# Patient Record
Sex: Female | Born: 1945 | Race: White | Hispanic: No | Marital: Married | Smoking: Former smoker
Health system: Southern US, Community
[De-identification: ages and names within clinical notes are randomized; demographics above are authoritative.]

## PROBLEM LIST (undated history)

## (undated) ENCOUNTER — Emergency Department (HOSPITAL_COMMUNITY): Admission: EM | Payer: Commercial Managed Care - HMO | Source: Home / Self Care

## (undated) DIAGNOSIS — D631 Anemia in chronic kidney disease: Secondary | ICD-10-CM

## (undated) DIAGNOSIS — E039 Hypothyroidism, unspecified: Secondary | ICD-10-CM

## (undated) DIAGNOSIS — J189 Pneumonia, unspecified organism: Secondary | ICD-10-CM

## (undated) DIAGNOSIS — I480 Paroxysmal atrial fibrillation: Secondary | ICD-10-CM

## (undated) DIAGNOSIS — I739 Peripheral vascular disease, unspecified: Secondary | ICD-10-CM

## (undated) DIAGNOSIS — I5022 Chronic systolic (congestive) heart failure: Secondary | ICD-10-CM

## (undated) DIAGNOSIS — K219 Gastro-esophageal reflux disease without esophagitis: Secondary | ICD-10-CM

## (undated) DIAGNOSIS — I428 Other cardiomyopathies: Secondary | ICD-10-CM

## (undated) DIAGNOSIS — N189 Chronic kidney disease, unspecified: Secondary | ICD-10-CM

## (undated) DIAGNOSIS — A491 Streptococcal infection, unspecified site: Secondary | ICD-10-CM

## (undated) DIAGNOSIS — D649 Anemia, unspecified: Secondary | ICD-10-CM

## (undated) DIAGNOSIS — E785 Hyperlipidemia, unspecified: Secondary | ICD-10-CM

## (undated) DIAGNOSIS — T827XXA Infection and inflammatory reaction due to other cardiac and vascular devices, implants and grafts, initial encounter: Secondary | ICD-10-CM

## (undated) DIAGNOSIS — I10 Essential (primary) hypertension: Secondary | ICD-10-CM

## (undated) DIAGNOSIS — I447 Left bundle-branch block, unspecified: Secondary | ICD-10-CM

## (undated) DIAGNOSIS — N183 Chronic kidney disease, stage 3 unspecified: Secondary | ICD-10-CM

## (undated) DIAGNOSIS — G4733 Obstructive sleep apnea (adult) (pediatric): Secondary | ICD-10-CM

## (undated) DIAGNOSIS — I2699 Other pulmonary embolism without acute cor pulmonale: Secondary | ICD-10-CM

## (undated) DIAGNOSIS — E119 Type 2 diabetes mellitus without complications: Secondary | ICD-10-CM

## (undated) DIAGNOSIS — Z720 Tobacco use: Secondary | ICD-10-CM

## (undated) DIAGNOSIS — B9620 Unspecified Escherichia coli [E. coli] as the cause of diseases classified elsewhere: Secondary | ICD-10-CM

## (undated) DIAGNOSIS — Z95810 Presence of automatic (implantable) cardiac defibrillator: Secondary | ICD-10-CM

## (undated) DIAGNOSIS — D374 Neoplasm of uncertain behavior of colon: Secondary | ICD-10-CM

## (undated) DIAGNOSIS — J984 Other disorders of lung: Secondary | ICD-10-CM

## (undated) DIAGNOSIS — R0600 Dyspnea, unspecified: Secondary | ICD-10-CM

## (undated) DIAGNOSIS — M1990 Unspecified osteoarthritis, unspecified site: Secondary | ICD-10-CM

## (undated) DIAGNOSIS — Z9289 Personal history of other medical treatment: Secondary | ICD-10-CM

## (undated) DIAGNOSIS — D500 Iron deficiency anemia secondary to blood loss (chronic): Secondary | ICD-10-CM

## (undated) DIAGNOSIS — N390 Urinary tract infection, site not specified: Secondary | ICD-10-CM

## (undated) HISTORY — DX: Anemia, unspecified: D64.9

## (undated) HISTORY — PX: ABDOMINAL HYSTERECTOMY: SHX81

## (undated) HISTORY — DX: Streptococcal infection, unspecified site: A49.1

## (undated) HISTORY — DX: Obstructive sleep apnea (adult) (pediatric): G47.33

## (undated) HISTORY — DX: Other disorders of lung: J98.4

## (undated) HISTORY — DX: Unspecified osteoarthritis, unspecified site: M19.90

## (undated) HISTORY — DX: Left bundle-branch block, unspecified: I44.7

## (undated) HISTORY — DX: Other cardiomyopathies: I42.8

## (undated) HISTORY — PX: KNEE ARTHROSCOPY: SUR90

## (undated) HISTORY — DX: Hyperlipidemia, unspecified: E78.5

## (undated) HISTORY — DX: Neoplasm of uncertain behavior of colon: D37.4

## (undated) HISTORY — DX: Essential (primary) hypertension: I10

## (undated) HISTORY — DX: Pneumonia, unspecified organism: J18.9

## (undated) HISTORY — DX: Anemia in chronic kidney disease: D63.1

## (undated) HISTORY — DX: Tobacco use: Z72.0

## (undated) HISTORY — DX: Chronic kidney disease, unspecified: N18.9

## (undated) HISTORY — PX: CARDIAC CATHETERIZATION: SHX172

## (undated) HISTORY — DX: Iron deficiency anemia secondary to blood loss (chronic): D50.0

## (undated) HISTORY — DX: Gastro-esophageal reflux disease without esophagitis: K21.9

## (undated) HISTORY — DX: Other pulmonary embolism without acute cor pulmonale: I26.99

---

## 1979-02-22 HISTORY — PX: TUBAL LIGATION: SHX77

## 2001-01-06 ENCOUNTER — Encounter: Payer: Self-pay | Admitting: Internal Medicine

## 2001-01-06 ENCOUNTER — Ambulatory Visit (HOSPITAL_COMMUNITY): Admission: RE | Admit: 2001-01-06 | Discharge: 2001-01-06 | Payer: Self-pay

## 2001-04-02 ENCOUNTER — Ambulatory Visit (HOSPITAL_COMMUNITY): Admission: RE | Admit: 2001-04-02 | Discharge: 2001-04-02 | Payer: Self-pay | Admitting: Cardiology

## 2003-05-08 ENCOUNTER — Encounter: Payer: Self-pay | Admitting: Orthopedic Surgery

## 2003-05-23 ENCOUNTER — Ambulatory Visit (HOSPITAL_COMMUNITY): Admission: RE | Admit: 2003-05-23 | Discharge: 2003-05-23 | Payer: Self-pay | Admitting: Orthopedic Surgery

## 2003-09-11 ENCOUNTER — Emergency Department (HOSPITAL_COMMUNITY): Admission: EM | Admit: 2003-09-11 | Discharge: 2003-09-11 | Payer: Self-pay | Admitting: Emergency Medicine

## 2003-10-13 ENCOUNTER — Ambulatory Visit (HOSPITAL_COMMUNITY): Admission: RE | Admit: 2003-10-13 | Discharge: 2003-10-13 | Payer: Self-pay | Admitting: Internal Medicine

## 2003-10-14 ENCOUNTER — Inpatient Hospital Stay (HOSPITAL_COMMUNITY): Admission: EM | Admit: 2003-10-14 | Discharge: 2003-10-17 | Payer: Self-pay | Admitting: Emergency Medicine

## 2003-11-15 ENCOUNTER — Other Ambulatory Visit: Admission: RE | Admit: 2003-11-15 | Discharge: 2003-11-15 | Payer: Self-pay | Admitting: Dermatology

## 2003-12-27 ENCOUNTER — Ambulatory Visit (HOSPITAL_COMMUNITY): Admission: RE | Admit: 2003-12-27 | Discharge: 2003-12-27 | Payer: Self-pay | Admitting: Internal Medicine

## 2004-04-12 ENCOUNTER — Ambulatory Visit (HOSPITAL_COMMUNITY): Admission: RE | Admit: 2004-04-12 | Discharge: 2004-04-12 | Payer: Self-pay | Admitting: Cardiology

## 2004-05-23 ENCOUNTER — Ambulatory Visit: Payer: Self-pay | Admitting: Internal Medicine

## 2004-05-23 HISTORY — PX: A-V CARDIAC PACEMAKER INSERTION: SHX562

## 2004-06-04 ENCOUNTER — Ambulatory Visit: Payer: Self-pay

## 2004-06-10 ENCOUNTER — Inpatient Hospital Stay (HOSPITAL_COMMUNITY): Admission: RE | Admit: 2004-06-10 | Discharge: 2004-06-11 | Payer: Self-pay | Admitting: Internal Medicine

## 2004-06-10 ENCOUNTER — Ambulatory Visit: Payer: Self-pay | Admitting: Internal Medicine

## 2004-06-26 ENCOUNTER — Ambulatory Visit: Payer: Self-pay | Admitting: Internal Medicine

## 2004-06-26 ENCOUNTER — Ambulatory Visit: Payer: Self-pay

## 2004-08-09 ENCOUNTER — Ambulatory Visit (HOSPITAL_COMMUNITY): Admission: RE | Admit: 2004-08-09 | Discharge: 2004-08-09 | Payer: Self-pay | Admitting: Family Medicine

## 2004-09-16 ENCOUNTER — Ambulatory Visit: Payer: Self-pay | Admitting: Orthopedic Surgery

## 2004-10-07 ENCOUNTER — Ambulatory Visit: Payer: Self-pay | Admitting: Internal Medicine

## 2004-11-15 ENCOUNTER — Emergency Department (HOSPITAL_COMMUNITY): Admission: EM | Admit: 2004-11-15 | Discharge: 2004-11-15 | Payer: Self-pay | Admitting: Emergency Medicine

## 2004-11-21 ENCOUNTER — Ambulatory Visit: Payer: Self-pay | Admitting: Cardiology

## 2004-12-04 ENCOUNTER — Ambulatory Visit: Payer: Self-pay | Admitting: *Deleted

## 2005-03-14 ENCOUNTER — Ambulatory Visit: Payer: Self-pay | Admitting: *Deleted

## 2005-04-07 ENCOUNTER — Ambulatory Visit: Payer: Self-pay | Admitting: Orthopedic Surgery

## 2005-04-16 ENCOUNTER — Ambulatory Visit (HOSPITAL_COMMUNITY): Admission: RE | Admit: 2005-04-16 | Discharge: 2005-04-16 | Payer: Self-pay | Admitting: Family Medicine

## 2005-05-08 ENCOUNTER — Encounter: Admission: RE | Admit: 2005-05-08 | Discharge: 2005-05-08 | Payer: Self-pay | Admitting: Orthopedic Surgery

## 2005-06-27 ENCOUNTER — Ambulatory Visit: Payer: Self-pay | Admitting: Cardiology

## 2005-06-30 ENCOUNTER — Ambulatory Visit: Payer: Self-pay

## 2005-06-30 ENCOUNTER — Encounter: Payer: Self-pay | Admitting: Cardiology

## 2005-07-04 ENCOUNTER — Ambulatory Visit: Payer: Self-pay | Admitting: Internal Medicine

## 2005-07-24 DIAGNOSIS — J189 Pneumonia, unspecified organism: Secondary | ICD-10-CM

## 2005-07-24 HISTORY — PX: TOTAL HIP ARTHROPLASTY: SHX124

## 2005-07-24 HISTORY — DX: Pneumonia, unspecified organism: J18.9

## 2005-08-05 ENCOUNTER — Ambulatory Visit: Payer: Self-pay | Admitting: Cardiology

## 2005-08-05 ENCOUNTER — Ambulatory Visit: Payer: Self-pay | Admitting: Physical Medicine & Rehabilitation

## 2005-08-05 ENCOUNTER — Inpatient Hospital Stay (HOSPITAL_COMMUNITY): Admission: RE | Admit: 2005-08-05 | Discharge: 2005-08-08 | Payer: Self-pay | Admitting: Orthopedic Surgery

## 2005-08-08 ENCOUNTER — Inpatient Hospital Stay
Admission: RE | Admit: 2005-08-08 | Discharge: 2005-08-15 | Payer: Self-pay | Admitting: Physical Medicine & Rehabilitation

## 2005-09-26 ENCOUNTER — Inpatient Hospital Stay (HOSPITAL_COMMUNITY): Admission: EM | Admit: 2005-09-26 | Discharge: 2005-10-01 | Payer: Self-pay | Admitting: Emergency Medicine

## 2005-09-29 ENCOUNTER — Ambulatory Visit: Payer: Self-pay | Admitting: Internal Medicine

## 2005-10-01 ENCOUNTER — Ambulatory Visit: Payer: Self-pay | Admitting: Orthopedic Surgery

## 2005-10-17 ENCOUNTER — Ambulatory Visit: Payer: Self-pay | Admitting: Orthopedic Surgery

## 2005-10-17 ENCOUNTER — Inpatient Hospital Stay (HOSPITAL_COMMUNITY): Admission: EM | Admit: 2005-10-17 | Discharge: 2005-11-14 | Payer: Self-pay | Admitting: Emergency Medicine

## 2005-10-20 ENCOUNTER — Ambulatory Visit: Payer: Self-pay | Admitting: Cardiology

## 2005-10-23 ENCOUNTER — Ambulatory Visit: Payer: Self-pay | Admitting: Orthopedic Surgery

## 2005-10-24 ENCOUNTER — Ambulatory Visit: Payer: Self-pay | Admitting: Critical Care Medicine

## 2005-10-24 ENCOUNTER — Ambulatory Visit: Payer: Self-pay | Admitting: *Deleted

## 2005-10-24 ENCOUNTER — Encounter: Payer: Self-pay | Admitting: Orthopedic Surgery

## 2005-10-26 ENCOUNTER — Ambulatory Visit: Payer: Self-pay | Admitting: Infectious Diseases

## 2005-11-02 ENCOUNTER — Encounter: Payer: Self-pay | Admitting: Cardiology

## 2005-11-18 HISTORY — PX: PACEMAKER REMOVAL: SHX5066

## 2005-11-19 ENCOUNTER — Ambulatory Visit: Payer: Self-pay | Admitting: Internal Medicine

## 2005-11-24 ENCOUNTER — Ambulatory Visit: Payer: Self-pay | Admitting: Internal Medicine

## 2005-12-05 ENCOUNTER — Ambulatory Visit: Payer: Self-pay | Admitting: Cardiology

## 2005-12-16 ENCOUNTER — Ambulatory Visit: Payer: Self-pay | Admitting: Family Medicine

## 2006-01-14 ENCOUNTER — Ambulatory Visit: Payer: Self-pay | Admitting: *Deleted

## 2006-02-10 ENCOUNTER — Emergency Department (HOSPITAL_COMMUNITY): Admission: EM | Admit: 2006-02-10 | Discharge: 2006-02-11 | Payer: Self-pay | Admitting: Emergency Medicine

## 2006-02-13 ENCOUNTER — Ambulatory Visit: Payer: Self-pay | Admitting: Internal Medicine

## 2006-02-18 ENCOUNTER — Ambulatory Visit: Payer: Self-pay | Admitting: *Deleted

## 2006-03-18 ENCOUNTER — Ambulatory Visit: Payer: Self-pay | Admitting: Cardiology

## 2006-04-09 ENCOUNTER — Inpatient Hospital Stay (HOSPITAL_COMMUNITY): Admission: EM | Admit: 2006-04-09 | Discharge: 2006-04-13 | Payer: Self-pay | Admitting: Emergency Medicine

## 2006-04-09 ENCOUNTER — Ambulatory Visit: Payer: Self-pay | Admitting: Cardiology

## 2006-04-28 ENCOUNTER — Ambulatory Visit: Payer: Self-pay | Admitting: Cardiology

## 2006-05-26 ENCOUNTER — Ambulatory Visit: Payer: Self-pay | Admitting: Cardiology

## 2006-06-29 ENCOUNTER — Ambulatory Visit: Payer: Self-pay | Admitting: Cardiology

## 2006-07-06 ENCOUNTER — Ambulatory Visit: Payer: Self-pay | Admitting: Internal Medicine

## 2006-07-24 ENCOUNTER — Encounter (INDEPENDENT_AMBULATORY_CARE_PROVIDER_SITE_OTHER): Payer: Self-pay | Admitting: Specialist

## 2006-07-24 ENCOUNTER — Ambulatory Visit: Payer: Self-pay | Admitting: Internal Medicine

## 2006-07-24 ENCOUNTER — Ambulatory Visit (HOSPITAL_COMMUNITY): Admission: RE | Admit: 2006-07-24 | Discharge: 2006-07-24 | Payer: Self-pay | Admitting: Internal Medicine

## 2006-09-03 ENCOUNTER — Inpatient Hospital Stay (HOSPITAL_COMMUNITY): Admission: EM | Admit: 2006-09-03 | Discharge: 2006-09-06 | Payer: Self-pay | Admitting: Emergency Medicine

## 2007-01-01 ENCOUNTER — Ambulatory Visit: Payer: Self-pay | Admitting: Internal Medicine

## 2007-03-03 ENCOUNTER — Ambulatory Visit: Payer: Self-pay | Admitting: Internal Medicine

## 2007-04-22 ENCOUNTER — Ambulatory Visit: Payer: Self-pay | Admitting: Cardiology

## 2007-04-22 LAB — CONVERTED CEMR LAB
ALT: 11 units/L
Basophils Relative: 0 %
Eosinophils Relative: 2 %
Glucose, Bld: 168 mg/dL
HCT: 34.6 %
Hgb A1c MFr Bld: 7.2 %
Monocytes Relative: 7 %
Platelets: 260 10*3/uL
Potassium: 4.5 meq/L
RDW: 12.9 %
Sodium: 138 meq/L
Total Bilirubin: 0.06 mg/dL

## 2007-06-02 ENCOUNTER — Ambulatory Visit: Payer: Self-pay | Admitting: Internal Medicine

## 2007-06-04 ENCOUNTER — Telehealth (INDEPENDENT_AMBULATORY_CARE_PROVIDER_SITE_OTHER): Payer: Self-pay | Admitting: *Deleted

## 2007-06-04 ENCOUNTER — Ambulatory Visit: Payer: Self-pay | Admitting: Internal Medicine

## 2007-06-04 DIAGNOSIS — I10 Essential (primary) hypertension: Secondary | ICD-10-CM | POA: Insufficient documentation

## 2007-06-04 DIAGNOSIS — E785 Hyperlipidemia, unspecified: Secondary | ICD-10-CM | POA: Insufficient documentation

## 2007-06-07 ENCOUNTER — Ambulatory Visit: Payer: Self-pay | Admitting: Cardiology

## 2007-06-08 ENCOUNTER — Ambulatory Visit (HOSPITAL_COMMUNITY): Admission: RE | Admit: 2007-06-08 | Discharge: 2007-06-08 | Payer: Self-pay | Admitting: Internal Medicine

## 2007-06-08 ENCOUNTER — Telehealth (INDEPENDENT_AMBULATORY_CARE_PROVIDER_SITE_OTHER): Payer: Self-pay | Admitting: *Deleted

## 2007-06-08 ENCOUNTER — Encounter (INDEPENDENT_AMBULATORY_CARE_PROVIDER_SITE_OTHER): Payer: Self-pay | Admitting: Internal Medicine

## 2007-06-08 ENCOUNTER — Ambulatory Visit (HOSPITAL_BASED_OUTPATIENT_CLINIC_OR_DEPARTMENT_OTHER): Admission: RE | Admit: 2007-06-08 | Discharge: 2007-06-08 | Payer: Self-pay | Admitting: Internal Medicine

## 2007-06-13 ENCOUNTER — Ambulatory Visit: Payer: Self-pay | Admitting: Internal Medicine

## 2007-06-24 HISTORY — PX: COLONOSCOPY W/ POLYPECTOMY: SHX1380

## 2007-06-29 ENCOUNTER — Encounter (INDEPENDENT_AMBULATORY_CARE_PROVIDER_SITE_OTHER): Payer: Self-pay | Admitting: Internal Medicine

## 2007-07-01 ENCOUNTER — Telehealth (INDEPENDENT_AMBULATORY_CARE_PROVIDER_SITE_OTHER): Payer: Self-pay | Admitting: *Deleted

## 2007-07-16 ENCOUNTER — Ambulatory Visit: Payer: Self-pay | Admitting: Internal Medicine

## 2007-07-16 ENCOUNTER — Ambulatory Visit: Payer: Self-pay | Admitting: Cardiology

## 2007-07-19 ENCOUNTER — Encounter (INDEPENDENT_AMBULATORY_CARE_PROVIDER_SITE_OTHER): Payer: Self-pay | Admitting: Internal Medicine

## 2007-07-19 ENCOUNTER — Telehealth (INDEPENDENT_AMBULATORY_CARE_PROVIDER_SITE_OTHER): Payer: Self-pay | Admitting: *Deleted

## 2007-07-30 ENCOUNTER — Ambulatory Visit: Admission: RE | Admit: 2007-07-30 | Discharge: 2007-07-30 | Payer: Self-pay | Admitting: Internal Medicine

## 2007-08-09 ENCOUNTER — Encounter (INDEPENDENT_AMBULATORY_CARE_PROVIDER_SITE_OTHER): Payer: Self-pay | Admitting: Internal Medicine

## 2007-08-09 ENCOUNTER — Ambulatory Visit: Payer: Self-pay | Admitting: Cardiology

## 2007-08-18 ENCOUNTER — Ambulatory Visit: Payer: Self-pay | Admitting: Pulmonary Disease

## 2007-08-27 ENCOUNTER — Encounter (INDEPENDENT_AMBULATORY_CARE_PROVIDER_SITE_OTHER): Payer: Self-pay | Admitting: Internal Medicine

## 2007-09-02 ENCOUNTER — Ambulatory Visit: Payer: Self-pay | Admitting: Internal Medicine

## 2007-09-07 ENCOUNTER — Telehealth (INDEPENDENT_AMBULATORY_CARE_PROVIDER_SITE_OTHER): Payer: Self-pay | Admitting: Internal Medicine

## 2007-09-30 ENCOUNTER — Encounter (INDEPENDENT_AMBULATORY_CARE_PROVIDER_SITE_OTHER): Payer: Self-pay | Admitting: Internal Medicine

## 2007-10-06 ENCOUNTER — Encounter (INDEPENDENT_AMBULATORY_CARE_PROVIDER_SITE_OTHER): Payer: Self-pay | Admitting: Internal Medicine

## 2007-12-06 ENCOUNTER — Encounter (INDEPENDENT_AMBULATORY_CARE_PROVIDER_SITE_OTHER): Payer: Self-pay | Admitting: Internal Medicine

## 2007-12-08 ENCOUNTER — Ambulatory Visit: Payer: Self-pay | Admitting: Cardiology

## 2007-12-15 ENCOUNTER — Ambulatory Visit: Payer: Self-pay | Admitting: Cardiology

## 2008-03-02 ENCOUNTER — Encounter (INDEPENDENT_AMBULATORY_CARE_PROVIDER_SITE_OTHER): Payer: Self-pay | Admitting: Internal Medicine

## 2008-04-09 ENCOUNTER — Ambulatory Visit: Payer: Self-pay | Admitting: Cardiology

## 2008-04-09 ENCOUNTER — Inpatient Hospital Stay (HOSPITAL_COMMUNITY): Admission: EM | Admit: 2008-04-09 | Discharge: 2008-04-12 | Payer: Self-pay | Admitting: Emergency Medicine

## 2008-04-11 ENCOUNTER — Encounter: Payer: Self-pay | Admitting: Cardiology

## 2008-05-01 ENCOUNTER — Ambulatory Visit: Payer: Self-pay | Admitting: Cardiology

## 2008-05-29 ENCOUNTER — Ambulatory Visit: Payer: Self-pay | Admitting: Cardiology

## 2008-06-02 ENCOUNTER — Encounter (HOSPITAL_COMMUNITY): Admission: RE | Admit: 2008-06-02 | Discharge: 2008-06-20 | Payer: Self-pay | Admitting: Cardiology

## 2008-06-02 ENCOUNTER — Ambulatory Visit: Payer: Self-pay | Admitting: Cardiology

## 2008-07-04 ENCOUNTER — Ambulatory Visit: Payer: Self-pay | Admitting: Cardiology

## 2008-10-23 ENCOUNTER — Ambulatory Visit: Payer: Self-pay | Admitting: Cardiology

## 2008-10-23 ENCOUNTER — Ambulatory Visit (HOSPITAL_COMMUNITY): Admission: RE | Admit: 2008-10-23 | Discharge: 2008-10-23 | Payer: Self-pay | Admitting: Cardiology

## 2008-10-23 ENCOUNTER — Encounter: Payer: Self-pay | Admitting: Orthopedic Surgery

## 2008-11-13 ENCOUNTER — Encounter: Payer: Self-pay | Admitting: Orthopedic Surgery

## 2008-11-15 ENCOUNTER — Ambulatory Visit: Payer: Self-pay | Admitting: Orthopedic Surgery

## 2008-11-28 ENCOUNTER — Ambulatory Visit: Payer: Self-pay | Admitting: Cardiology

## 2008-11-28 ENCOUNTER — Encounter: Payer: Self-pay | Admitting: Cardiology

## 2008-12-02 ENCOUNTER — Encounter (INDEPENDENT_AMBULATORY_CARE_PROVIDER_SITE_OTHER): Payer: Self-pay | Admitting: *Deleted

## 2008-12-02 LAB — CONVERTED CEMR LAB
CO2: 25 meq/L
Calcium: 10.2 mg/dL
Creatinine, Ser: 1.4 mg/dL
Glucose, Bld: 179 mg/dL
HDL: 34 mg/dL
Hgb A1c MFr Bld: 6.6 %
Sodium: 141 meq/L
Triglycerides: 258 mg/dL

## 2008-12-27 ENCOUNTER — Encounter: Payer: Self-pay | Admitting: Orthopedic Surgery

## 2009-01-01 ENCOUNTER — Encounter: Payer: Self-pay | Admitting: Cardiology

## 2009-01-29 ENCOUNTER — Ambulatory Visit: Payer: Self-pay | Admitting: Cardiology

## 2009-01-29 DIAGNOSIS — I447 Left bundle-branch block, unspecified: Secondary | ICD-10-CM

## 2009-03-15 ENCOUNTER — Encounter (INDEPENDENT_AMBULATORY_CARE_PROVIDER_SITE_OTHER): Payer: Self-pay | Admitting: *Deleted

## 2009-03-19 ENCOUNTER — Encounter: Payer: Self-pay | Admitting: Cardiology

## 2009-03-19 LAB — CONVERTED CEMR LAB
AST: 13 units/L (ref 0–37)
Albumin: 4.8 g/dL (ref 3.5–5.2)
Alkaline Phosphatase: 72 units/L (ref 39–117)
CO2: 25 meq/L (ref 19–32)
Calcium: 10.2 mg/dL (ref 8.4–10.5)
Chloride: 105 meq/L (ref 96–112)
Creatinine, Ser: 1.53 mg/dL — ABNORMAL HIGH (ref 0.40–1.20)
Lymphocytes Relative: 33 % (ref 12–46)
Lymphs Abs: 2.7 10*3/uL (ref 0.7–4.0)
MCV: 93.8 fL (ref 78.0–100.0)
Monocytes Relative: 8 % (ref 3–12)
Platelets: 269 10*3/uL (ref 150–400)
WBC: 8.4 10*3/uL (ref 4.0–10.5)

## 2009-03-20 ENCOUNTER — Encounter (INDEPENDENT_AMBULATORY_CARE_PROVIDER_SITE_OTHER): Payer: Self-pay | Admitting: *Deleted

## 2009-05-30 ENCOUNTER — Encounter (INDEPENDENT_AMBULATORY_CARE_PROVIDER_SITE_OTHER): Payer: Self-pay | Admitting: *Deleted

## 2009-06-01 ENCOUNTER — Encounter (INDEPENDENT_AMBULATORY_CARE_PROVIDER_SITE_OTHER): Payer: Self-pay | Admitting: *Deleted

## 2009-06-01 ENCOUNTER — Ambulatory Visit: Payer: Self-pay | Admitting: Cardiology

## 2009-06-11 ENCOUNTER — Encounter (INDEPENDENT_AMBULATORY_CARE_PROVIDER_SITE_OTHER): Payer: Self-pay | Admitting: *Deleted

## 2009-06-11 LAB — CONVERTED CEMR LAB
Albumin: 4.7 g/dL
Albumin: 4.7 g/dL (ref 3.5–5.2)
BUN: 26 mg/dL — ABNORMAL HIGH (ref 6–23)
Basophils Absolute: 0 10*3/uL
Basophils Absolute: 0 10*3/uL (ref 0.0–0.1)
Basophils Relative: 1 %
Chloride: 102 meq/L
Chloride: 102 meq/L (ref 96–112)
Eosinophils Absolute: 0.1 10*3/uL (ref 0.0–0.7)
Eosinophils Relative: 2 %
Glucose, Bld: 139 mg/dL — ABNORMAL HIGH (ref 70–99)
Hemoglobin: 11.8 g/dL
Hemoglobin: 11.8 g/dL — ABNORMAL LOW (ref 12.0–15.0)
Hgb A1c MFr Bld: 6.8 %
Lymphocytes Relative: 32 %
Lymphocytes Relative: 32 % (ref 12–46)
Lymphs Abs: 1.8 10*3/uL
MCHC: 33.1 g/dL
MCV: 94.2 fL (ref 78.0–100.0)
Monocytes Absolute: 0.4 10*3/uL
Monocytes Relative: 7 % (ref 3–12)
Neutrophils Relative %: 59 % (ref 43–77)
Potassium: 4 meq/L
Potassium: 4 meq/L (ref 3.5–5.3)
RBC: 3.78 M/uL
RBC: 3.78 M/uL — ABNORMAL LOW (ref 3.87–5.11)
RDW: 12.8 % (ref 11.5–15.5)
Sodium: 142 meq/L (ref 135–145)
Total Bilirubin: 0.8 mg/dL (ref 0.3–1.2)
Total Protein: 7.5 g/dL
Total Protein: 7.5 g/dL (ref 6.0–8.3)

## 2009-07-09 ENCOUNTER — Encounter (INDEPENDENT_AMBULATORY_CARE_PROVIDER_SITE_OTHER): Payer: Self-pay | Admitting: *Deleted

## 2009-07-09 LAB — CONVERTED CEMR LAB: Cholesterol: 168 mg/dL

## 2009-09-25 ENCOUNTER — Encounter (INDEPENDENT_AMBULATORY_CARE_PROVIDER_SITE_OTHER): Payer: Self-pay

## 2009-12-31 ENCOUNTER — Encounter (INDEPENDENT_AMBULATORY_CARE_PROVIDER_SITE_OTHER): Payer: Self-pay | Admitting: *Deleted

## 2010-01-02 LAB — CONVERTED CEMR LAB
ALT: 9 units/L (ref 0–35)
Albumin: 4.4 g/dL (ref 3.5–5.2)
CO2: 25 meq/L (ref 19–32)
Calcium: 10.2 mg/dL (ref 8.4–10.5)
Chloride: 110 meq/L (ref 96–112)
Creatinine, Ser: 1.12 mg/dL (ref 0.40–1.20)
Hemoglobin: 11.6 g/dL — ABNORMAL LOW (ref 12.0–15.0)
Hgb A1c MFr Bld: 6.8 % — ABNORMAL HIGH (ref <5.7)
MCHC: 31.9 g/dL (ref 30.0–36.0)
MCV: 96.3 fL (ref 78.0–100.0)
Monocytes Absolute: 0.4 10*3/uL (ref 0.1–1.0)
Monocytes Relative: 7 % (ref 3–12)
Neutrophils Relative %: 63 % (ref 43–77)
Potassium: 4.8 meq/L (ref 3.5–5.3)
RBC: 3.78 M/uL — ABNORMAL LOW (ref 3.87–5.11)
Sodium: 145 meq/L (ref 135–145)
Total Protein: 7.8 g/dL (ref 6.0–8.3)
WBC: 6 10*3/uL (ref 4.0–10.5)

## 2010-01-04 ENCOUNTER — Encounter (INDEPENDENT_AMBULATORY_CARE_PROVIDER_SITE_OTHER): Payer: Self-pay | Admitting: *Deleted

## 2010-01-04 ENCOUNTER — Ambulatory Visit: Payer: Self-pay | Admitting: Cardiology

## 2010-04-10 ENCOUNTER — Encounter (INDEPENDENT_AMBULATORY_CARE_PROVIDER_SITE_OTHER): Payer: Self-pay | Admitting: *Deleted

## 2010-04-29 ENCOUNTER — Encounter: Payer: Self-pay | Admitting: Cardiology

## 2010-05-27 ENCOUNTER — Encounter (INDEPENDENT_AMBULATORY_CARE_PROVIDER_SITE_OTHER): Payer: Self-pay | Admitting: *Deleted

## 2010-05-27 LAB — CONVERTED CEMR LAB
AST: 14 units/L
Alkaline Phosphatase: 80 units/L
BUN: 30 mg/dL
CO2: 26 meq/L
Cholesterol: 171 mg/dL
HCT: 34.5 %
Hemoglobin: 11.8 g/dL
Hgb A1c MFr Bld: 6.9 %
Lymphocytes Relative: 29 %
Lymphs Abs: 1.9 10*3/uL
Monocytes Absolute: 0.4 10*3/uL
Platelets: 251 10*3/uL
Potassium: 4.6 meq/L
RDW: 13.2 %
Sodium: 139 meq/L
Total Protein: 7.5 g/dL
Triglycerides: 154 mg/dL
WBC: 6.7 10*3/uL

## 2010-05-29 ENCOUNTER — Encounter: Payer: Self-pay | Admitting: Cardiology

## 2010-07-09 ENCOUNTER — Encounter (INDEPENDENT_AMBULATORY_CARE_PROVIDER_SITE_OTHER): Payer: Self-pay | Admitting: *Deleted

## 2010-07-12 ENCOUNTER — Ambulatory Visit
Admission: RE | Admit: 2010-07-12 | Discharge: 2010-07-12 | Payer: Self-pay | Source: Home / Self Care | Attending: Cardiology | Admitting: Cardiology

## 2010-07-12 ENCOUNTER — Encounter (INDEPENDENT_AMBULATORY_CARE_PROVIDER_SITE_OTHER): Payer: Self-pay | Admitting: *Deleted

## 2010-07-12 DIAGNOSIS — R32 Unspecified urinary incontinence: Secondary | ICD-10-CM | POA: Insufficient documentation

## 2010-07-21 LAB — CONVERTED CEMR LAB
ALT: 11 units/L (ref 0–35)
Albumin: 4.4 g/dL (ref 3.5–5.2)
Basophils Absolute: 0 10*3/uL (ref 0.0–0.1)
Chloride: 109 meq/L (ref 96–112)
Creatinine, Ser: 0.99 mg/dL (ref 0.40–1.20)
Eosinophils Absolute: 0.1 10*3/uL (ref 0.0–0.7)
Glucose, Bld: 193 mg/dL — ABNORMAL HIGH (ref 70–99)
HDL: 30 mg/dL — ABNORMAL LOW (ref >39)
LDL Cholesterol: 137 mg/dL — ABNORMAL HIGH (ref 0–99)
Lymphocytes Relative: 24 % (ref 12–46)
Lymphs Abs: 1.2 10*3/uL (ref 0.7–4.0)
MCHC: 32.2 g/dL (ref 30.0–36.0)
MCV: 96.2 fL (ref 78.0–100.0)
Monocytes Absolute: 0.6 10*3/uL (ref 0.1–1.0)
Monocytes Relative: 11 % (ref 3–12)
Potassium: 4.4 meq/L (ref 3.5–5.3)
RDW: 13.2 % (ref 11.5–15.5)
Total Bilirubin: 0.5 mg/dL (ref 0.3–1.2)
Total CHOL/HDL Ratio: 7.7
VLDL: 64 mg/dL — ABNORMAL HIGH (ref 0–40)
WBC: 5.2 10*3/uL (ref 4.0–10.5)

## 2010-07-23 NOTE — Miscellaneous (Signed)
Summary: LABS LIPIDS,07/09/2009 CBCD,CMP,A1C,06/11/2009  Clinical Lists Changes  Observations: Added new observation of LDL: 43 mg/dL (21/30/8657 84:69) Added new observation of HDL: 33 mg/dL (62/95/2841 32:44) Added new observation of TRIGLYC TOT: 215 mg/dL (06/25/7251 66:44) Added new observation of CHOLESTEROL: 168 mg/dL (03/47/4259 56:38) Added new observation of ABSOLUTE BAS: 0.0 K/uL (06/11/2009 13:12) Added new observation of BASOPHIL %: 1 % (06/11/2009 13:12) Added new observation of EOS ABSLT: 0.1 K/uL (06/11/2009 13:12) Added new observation of % EOS AUTO: 2 % (06/11/2009 13:12) Added new observation of ABSOLUTE MON: 0.4 K/uL (06/11/2009 13:12) Added new observation of MONOCYTE %: 7 % (06/11/2009 13:12) Added new observation of ABS LYMPHOCY: 1.8 K/uL (06/11/2009 13:12) Added new observation of LYMPHS %: 32 % (06/11/2009 13:12) Added new observation of RDW: 12.8 % (06/11/2009 13:12) Added new observation of MCHC RBC: 33.1 g/dL (75/64/3329 51:88) Added new observation of RBC M/UL: 3.78 M/uL (06/11/2009 13:12) Added new observation of CALCIUM: 10.4 mg/dL (41/66/0630 16:01) Added new observation of ALBUMIN: 4.7 g/dL (09/32/3557 32:20) Added new observation of PROTEIN, TOT: 7.5 g/dL (25/42/7062 37:62) Added new observation of SGPT (ALT): 8 units/L (06/11/2009 13:12) Added new observation of SGOT (AST): 10 units/L (06/11/2009 13:12) Added new observation of ALK PHOS: 70 units/L (06/11/2009 13:12) Added new observation of CREATININE: 1.34 mg/dL (83/15/1761 60:73) Added new observation of BUN: 26 mg/dL (71/11/2692 85:46) Added new observation of BG RANDOM: 139 mg/dL (27/08/5007 38:18) Added new observation of CO2 PLSM/SER: 27 meq/L (06/11/2009 13:12) Added new observation of CL SERUM: 102 meq/L (06/11/2009 13:12) Added new observation of K SERUM: 4.0 meq/L (06/11/2009 13:12) Added new observation of NA: 142 meq/L (06/11/2009 13:12) Added new observation of PLATELETK/UL: 202 K/uL  (06/11/2009 13:12) Added new observation of MCV: 94.2 fL (06/11/2009 13:12) Added new observation of HCT: 35.6 % (06/11/2009 13:12) Added new observation of HGB: 11.8 g/dL (29/93/7169 67:89) Added new observation of WBC COUNT: 5.8 10*3/microliter (06/11/2009 13:12) Added new observation of HGBA1C: 6.8 % (06/11/2009 13:12)

## 2010-07-23 NOTE — Letter (Signed)
Summary: [facility] at [employer]  [address] [place] [address]   Phone: [phone]  Fax: [phone]     January 04, 2010 MRN: [id number]   [name] [address] [place], [place]  [address]      YOUR LAB WORK IS DUE   June 06, 2010  Please go to [facility], located across the street from [facility] on the second floor.  Hours are Monday - Friday 7am until 7:30pm         Saturday 8am until 12noon    __  DO NOT EAT OR DRINK AFTER MIDNIGHT EVENING PRIOR TO LABWORK  _X_ YOUR LABWORK IS NOT FASTING --YOU MAY EAT PRIOR TO LABWORK

## 2010-07-23 NOTE — Assessment & Plan Note (Signed)
Summary: Z6X      Allergies Added: NKDA  Visit Type:  Follow-up Referring Provider:  . Primary Provider:  Dr. [name]   History of Present Illness: [name] returns to the office as scheduled for continued assessment and treatment of a severe cardiomyopathy.  Since her last visit, she has been stable.  Ambulation is greatly impaired by chronic right hip pain, but she gets around some with a walker.  She notes chronic fatigue.  She has no orthopnea, PND, chest pain or pedal edema.  We have been attempting to avoid orthopaedic surgery due to markedly increased risk and an anticipated difficult recovery.  Now she has also been asked to  consider bladder resuspension for treatment of stress incontinence.  She has not yet been evaluated by a gynecologist.  Current Medications (verified): 1)  Glimepiride 4 Mg  Tabs (Glimepiride) .Marland Kitchen.. 1 By Mouth Once Daily 2)  Carvedilol 25 Mg  Tabs (Carvedilol) .Marland Kitchen.. 1 By Mouth Two Times A Day 3)  Metformin Hcl 1000 Mg  Tabs (Metformin Hcl) .Marland Kitchen.. 1 By Mouth Two Times A Day 4)  Spironolactone 25 Mg  Tabs (Spironolactone) .Marland Kitchen.. 1 By Mouth Once Daily 5)  Furosemide 80 Mg  Tabs (Furosemide) .Marland Kitchen.. 1 Tab Am 1/2 Tab Pm 6)  Lancets .... Use As Directed Daily 7)  Glucometer Strips .... Use As Directed Daily 8)  Norco 10-325 Mg Tabs (Hydrocodone-Acetaminophen) .Marland Kitchen.. 1 Q 4 As Needed Pain 9)  Tylenol Pm Extra Strength 500-25 Mg Tabs (Diphenhydramine-Apap (Sleep)) .... As Needed 10)  Lisinopril 20 Mg Tabs (Lisinopril) .Marland Kitchen.. 1 Tab Daily 11)  Pravastatin Sodium 40 Mg Tabs (Pravastatin Sodium) .Marland Kitchen.. 1 Tabe Two Times A Day  Allergies (verified): No Known Drug Allergies  Past History:  PMH, FH, and Social History reviewed and updated.  Review of Systems       See history of present illness.  Vital Signs:  Patient profile:   65 year old female Weight:      192 pounds BMI:     32.07 Pulse rate:   73 / minute BP sitting:   127 / 68  (right arm)  Vitals Entered  By: Dreama Saa, CNA (January 04, 2010 2:46 PM)  Physical Exam  General:  Overweight; well developed; no acute distress:   Neck-No JVD; no carotid bruits; no HJR Lungs-No tachypnea, no rales; no rhonchi; no wheezes: Cardiovascular-normal PMI; normal S1; prominent splitting of the second heart sound; modest low frequency systolic murmur at the left sternal border Abdomen-BS normal; soft and non-tender without masses or organomegaly:  Musculoskeletal-No deformities, no cyanosis or clubbing: Neurologic-Normal cranial nerves; symmetric strength and tone:  Skin-Warm, no significant lesions: Extremities-Nl distal pulses; trace edema:     Impression & Recommendations:  Problem # 1:  CARDIOMYOPATHY, 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  Serial chemistry profile showed normal renal function and normal electrolytes.  She has minimal anemia that has not worsened.  Problem # 2:  HYPERTENSION (ICD-401.9) Blood pressure control has not been an issue since development of severe left ventricular dysfunction.  On the other hand, there has been no hypotension to limit use of medications to treat her cardiomyopathy, all of which are currently being given at optimal dosage.  Problem # 3:  HYPERLIPIDEMIA (ICD-272.4) Recent lipid profile was good.  Hyperlipidemia has been adequately treated with a high dose of pravastatin without apparent adverse effects.  There continues to be no objective evidence for vascular disease, but  use of a statin in this patient with diabetes is appropriate.  Problem # 4:  RENAL DISEASE, CHRONIC, STAGE III (ICD-585.3) Renal function has been slightly improved with her current medical regime.  Problem # 5:  DIABETES MELLITUS, TYPE II (ICD-250.00) Hemoglobin A1c values have been excellent, having been maintained below 7 for at least the past 6 months.  I will plan to reassess this nice woman in 6 months and I will be happy to discuss  consideration of surgery with her gynecologist, once she has been appropriately evaluated.  I will seek an opinion from a local pain clinic as to whether they think evaluation and treatment in their facility could assist with her hip discomfort.  Other Orders: Future Orders: T-Basic Metabolic Panel [phone]) ... 03/07/2010 T-Basic Metabolic Panel [id number]) ... 06/06/2010  Patient Instructions: 1)    2)  Your physician recommends that you return for lab work in:2 and 5 months 3)  Your physician recommends that you schedule a follow-up appointment in: 6 months  Prevention & Chronic Care Immunizations   Influenza vaccine: Historical  (03/24/2007)    Tetanus booster: Not documented    Pneumococcal vaccine: Historical  (03/23/2006)    H. zoster vaccine: Not documented  Colorectal Screening   Hemoccult: Not documented    Colonoscopy: polyps  (07/24/2006)   Colonoscopy due: 07/2009  Other Screening   Pap smear: Not documented    Mammogram: bi rads 1  (06/08/2007)   Mammogram due: 06/2008    DXA bone density scan: Not documented   Smoking status: quit  (11/15/2008)    Screening comments: patient was smoking 5 cigs daily when she stopped  Diabetes Mellitus   HgbA1C: 6.8  (06/11/2009)    Eye exam: Not documented    Foot exam: yes  (07/16/2007)   High risk foot: Not documented   Foot care education: Not documented    Urine microalbumin/creatinine ratio: 2.7  (06/08/2007)  Lipids   Total Cholesterol: 168  (07/09/2009)   LDL: 43  (07/09/2009)   LDL Direct: Not documented   HDL: 33  (07/09/2009)   Triglycerides: 215  (07/09/2009)    SGOT (AST): 10  (06/11/2009)   SGPT (ALT): 8  (06/11/2009)   Alkaline phosphatase: 70  (06/11/2009)   Total bilirubin: 0.8  (06/11/2009)  Hypertension   Last Blood Pressure: 127 / 68  (01/04/2010)   Serum creatinine: 1.34  (06/11/2009)   Serum potassium 4.0  (06/11/2009)  Self-Management Support :    Diabetes  self-management support: Not documented    Hypertension self-management support: Not documented    Lipid self-management support: Not documented

## 2010-07-23 NOTE — Letter (Signed)
Summary: [facility], [facility] at [employer]  [address], [place] [address]   Phone: [phone]  Fax: [phone]     January 04, 2010 MRN: [id number]   XANDRIA GALLAGA [address] [place], [place]  [address]      YOUR LAB WORK IS DUE   March 07, 2010  Please go to [facility], located across the street from [facility] on the second floor.  Hours are Monday - Friday 7am until 7:30pm         Saturday 8am until 12noon    __  DO NOT EAT OR DRINK AFTER MIDNIGHT EVENING PRIOR TO LABWORK  _X_ YOUR LABWORK IS NOT FASTING --YOU MAY EAT PRIOR TO LABWORK

## 2010-07-23 NOTE — Letter (Signed)
Summary: Recall Colonoscopy/Endoscopy, Change to Office Visit  [facility]  [address]   [place], [place] [address]   Phone: [phone]  Fax: [phone]      September 25, 2009   [name] [address] [place], [place]  [address] April 28, 1946   Dear [name],   According to our records, it is time for you to schedule a Colonoscopy. Please calll our office and ask to speak to the triage nurse to get this appointment.   Sincerely,   Cloria Spring LPN  [facility] Ph: [phone]   Fax: [phone]

## 2010-07-23 NOTE — Miscellaneous (Signed)
Summary: Orders Update  Clinical Lists Changes  Orders: Added new Referral order of Misc. Referral (Misc. Ref) - Signed 

## 2010-07-25 NOTE — Miscellaneous (Signed)
Summary: LABS CBCD,CMP,LIPID,A1C,05/27/2010  Clinical Lists Changes  Observations: Added new observation of CALCIUM: 10.0 mg/dL (16/03/9603 54:09) Added new observation of ALBUMIN: 4.7 g/dL (81/19/1478 29:56) Added new observation of PROTEIN, TOT: 7.5 g/dL (21/30/8657 84:69) Added new observation of SGPT (ALT): 10 units/L (05/27/2010 10:25) Added new observation of SGOT (AST): 14 units/L (05/27/2010 10:25) Added new observation of ALK PHOS: 80 units/L (05/27/2010 10:25) Added new observation of CREATININE: 1.24 mg/dL (62/95/2841 32:44) Added new observation of BUN: 30 mg/dL (06/25/7251 66:44) Added new observation of BG RANDOM: 148 mg/dL (03/47/4259 56:38) Added new observation of CO2 PLSM/SER: 26 meq/L (05/27/2010 10:25) Added new observation of CL SERUM: 103 meq/L (05/27/2010 10:25) Added new observation of K SERUM: 4.6 meq/L (05/27/2010 10:25) Added new observation of NA: 139 meq/L (05/27/2010 10:25) Added new observation of LDL: 102 mg/dL (75/64/3329 51:88) Added new observation of HDL: 38 mg/dL (41/66/0630 16:01) Added new observation of TRIGLYC TOT: 154 mg/dL (09/32/3557 32:20) Added new observation of CHOLESTEROL: 171 mg/dL (25/42/7062 37:62) Added new observation of HGBA1C: 6.9 % (05/27/2010 10:25) Added new observation of ABSOLUTE BAS: 0.0 K/uL (05/27/2010 10:25) Added new observation of BASOPHIL %: 0 % (05/27/2010 10:25) Added new observation of EOS ABSLT: 0.1 K/uL (05/27/2010 10:25) Added new observation of % EOS AUTO: 1 % (05/27/2010 10:25) Added new observation of ABSOLUTE MON: 0.4 K/uL (05/27/2010 10:25) Added new observation of MONOCYTE %: 7 % (05/27/2010 10:25) Added new observation of ABS LYMPHOCY: 1.9 K/uL (05/27/2010 10:25) Added new observation of LYMPHS %: 29 % (05/27/2010 10:25) Added new observation of PLATELETK/UL: 251 K/uL (05/27/2010 10:25) Added new observation of RDW: 13.2 % (05/27/2010 10:25) Added new observation of MCHC RBC: 34.2 g/dL (83/15/1761  60:73) Added new observation of MCV: 91.5 fL (05/27/2010 10:25) Added new observation of HCT: 34.5 % (05/27/2010 10:25) Added new observation of HGB: 11.8 g/dL (71/11/2692 85:46) Added new observation of RBC M/UL: 3.77 M/uL (05/27/2010 10:25) Added new observation of WBC COUNT: 6.7 10*3/microliter (05/27/2010 10:25)

## 2010-07-25 NOTE — Assessment & Plan Note (Signed)
Summary: f60m  Medications Added LORCET 10/650 10-650 MG TABS (HYDROCODONE-ACETAMINOPHEN) Take 1 tablet by mouth three times a day as needed pain PRAVASTATIN SODIUM 40 MG TABS (PRAVASTATIN SODIUM) 2 tablets by mouth daily      Allergies Added: NKDA  Visit Type:  6 month follow up Referring Provider:  . Primary Provider:  Dr. [name]   History of Present Illness: [name] returns to the office for continued assessment and treatment of ischemic cardiomyopathy.  She continues to experience substantial pain in her right hip and continues to await consultation with Dr. Nickola Major, which has been delayed as the result of insurance issues.  Urologic evaluation also continues without a recommendation for therapy to date.  Current Medications (verified): 1)  Glimepiride 4 Mg  Tabs (Glimepiride) .Marland Kitchen.. 1 By Mouth Once Daily 2)  Carvedilol 25 Mg  Tabs (Carvedilol) .Marland Kitchen.. 1 By Mouth Two Times A Day 3)  Metformin Hcl 1000 Mg  Tabs (Metformin Hcl) .Marland Kitchen.. 1 By Mouth Two Times A Day 4)  Spironolactone 25 Mg  Tabs (Spironolactone) .Marland Kitchen.. 1 By Mouth Once Daily 5)  Furosemide 80 Mg  Tabs (Furosemide) .Marland Kitchen.. 1 Tab Am 1/2 Tab Pm 6)  Lancets .... Use As Directed Daily 7)  Glucometer Strips .... Use As Directed Daily 8)  Lorcet 10/650 10-650 Mg Tabs (Hydrocodone-Acetaminophen) .... Take 1 Tablet By Mouth Three Times A Day As Needed Pain 9)  Tylenol Pm Extra Strength 500-25 Mg Tabs (Diphenhydramine-Apap (Sleep)) .... As Needed 10)  Lisinopril 20 Mg Tabs (Lisinopril) .Marland Kitchen.. 1 Tab Daily 11)  Pravastatin Sodium 40 Mg Tabs (Pravastatin Sodium) .... 2 Tablets By Mouth Daily  Allergies (verified): No Known Drug Allergies  Past History:  PMH, FH, and Social History reviewed and updated.  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 Urinary incontinence Pneumonia, hx of Anemia-NOS: Mild and chronic. 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  Vital Signs:  Patient profile:   65 year old female Height:      65 inches Weight:      191 pounds BMI:     31.90 O2 Sat:      97 % on Room air Pulse rate:   67 / minute BP sitting:   117 / 65  (left arm)  Vitals Entered By: Dreama Saa, CNA (July 12, 2010 1:57 PM)  Nutrition Counseling: Patient's BMI is greater than 25 and therefore counseled on weight management options.  O2 Flow:  Room air  Physical Exam  General:  Overweight; well developed; no acute distress:   Neck-No JVD; no carotid bruits; no HJR Lungs-No tachypnea, no rales; no rhonchi; no wheezes: Cardiovascular-normal PMI; normal S1; prominent splitting of the second heart sound; modest systolic murmur at the left sternal border and apexr Abdomen-BS normal; soft and non-tender without masses or organomegaly:  Musculoskeletal-No deformities, no cyanosis or  clubbing: Neurologic-Normal cranial nerves; symmetric strength and tone:  Skin-Warm, no significant lesions: Extremities-Nl distal pulses; trace edema:     Impression & Recommendations:  Problem # 1:  CARDIOMYOPATHY, 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  A basic metabolic profile will be obtained in 3 months and a return visit  planned in 6 months.  Problem # 2:  HIP, ARTHRITIS, DEGEN./OSTEO (ICD-715.95) Evaluation by Orthopaedics is pending.  My hope is that pain relief can be achieved with conservative measures.  Problem # 3:  URINARY INCONTINENCE (ICD-788.30) Care will be coordinated with Urology, once a plan of therapy has been established.  Other Orders: Future Orders: T-Basic Metabolic Panel [id number]) ... 10/11/2010  Patient Instructions: 1)  Your physician recommends that you schedule a follow-up appointment in: 6 MONTHS 2)  Your physician recommends that you return for lab work in: 3  MONTHS

## 2010-07-25 NOTE — Letter (Signed)
Summary: [facility] [employer] [facility], [facility] at [employer]  [address], [place] [address]   Phone: [phone]  Fax: [phone]     July 12, 2010 MRN: [id number]   [name] [address] [place], [place]  [address]      YOUR LAB WORK IS DUE   October 11, 2010  Please go to [facility], located across the street from [facility] on the second floor.  Hours are Monday - Friday 7am until 7:30pm         Saturday 8am until 12noon      _X_ YOUR LABWORK IS NOT FASTING --YOU MAY EAT PRIOR TO LABWORK

## 2010-07-26 NOTE — Letter (Signed)
Summary: [facility] OFFICE NOTE  [facility] OFFICE NOTE   Imported By: Faythe Ghee 04/29/2010 16:36:20  _____________________________________________________________________  External Attachment:    Type:   Image     Comment:   External Document

## 2010-10-18 ENCOUNTER — Other Ambulatory Visit: Payer: Self-pay | Admitting: Cardiology

## 2010-10-18 LAB — BASIC METABOLIC PANEL
BUN: 34 mg/dL — ABNORMAL HIGH (ref 6–23)
Calcium: 9.7 mg/dL (ref 8.4–10.5)
Potassium: 4.7 mEq/L (ref 3.5–5.3)

## 2010-11-05 NOTE — Letter (Signed)
December 15, 2007    Elfredia Nevins, MD  [address]  [place], [place]  [address]   RE:  [name]  MRN:  [id number]  /  DOB:  06/06/1946   Dear [name],   [name] returns to the office for continued assessment and treatment  of nonischemic cardiomyopathy, hypertension, and dyslipidemia.  She has  insulin that has been suboptimally controlled with oral agents.  She has  been found to have sleep apnea.   CURRENT MEDICATIONS:  1. Digoxin 0.25 mg daily.  2. Metformin 1000 mg b.i.d.  3. Carvedilol 25 mg daily.  4. Spironolactone 25 mg daily.  5. Furosemide 40 mg b.i.d.  6. Enalapril 10 mg b.i.d.  7. Glimepiride 4 mg daily.  8. Aspirin 81 mg daily.  9. Meloxicam 15 mg daily.  10.Pravastatin 20 mg daily.   PHYSICAL EXAMINATION:  GENERAL:  A pleasant [name] in no acute distress,  who looks quite healthy.  VITAL SIGNS:  The weight 188, 2 pounds less than in February.  Blood  pressure 135/75, heart rate 65 and regular, respirations 14.  NECK:  No jugular venous distention; no carotid bruits.  LUNGS:  Clear.  CARDIAC:  Normal first and second heart sounds; grade 2/6 early systolic  ejection murmur; no third heart sound appreciated.  ABDOMEN:  Soft and nontender; no organomegaly.  EXTREMITIES:  Trace edema.   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  She has regained all of the weight  she lost and more.  Congestive heart failure appears very well  controlled.  Her physical capabilities are about as good as they have  been.  We will simplify attempt to simplify her medical regimen by  changing her furosemide to 60 daily and substituting Lisinopril 20 mg  daily for her b.i.d. enalapril.  She will return to you for better  control of diabetes.  A chemistry profile, complete blood count, and  hemoglobin A1c level are pending.  I will plan to see [name]  again  in 6 months, at which time, I suspect we will further decrease her  extensive medical regimen.    Sincerely,      Gerrit Friends. Dietrich Pates, MD, [facility]  Electronically Signed    RMR/MedQ  DD: 12/15/2007  DT: 12/16/2007  Job #: [id number]

## 2010-11-05 NOTE — Letter (Signed)
Oct 23, 2008    [name]. [name], MD  [address] [place], [place] [address]   RE:  [name], Tina  MRN:  [id number]  /  DOB:  09/29/1945   Dear [name]:   [name] returns to the office for continued assessment and treatment  of cardiomyopathy.  Her last hospitalization was 6 months ago, when she  was treated for pulmonary edema.  She has had hypertension that is well  controlled and a chronic left bundle branch block.  She has mild chronic  kidney disease and chronic anemia.  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  Of late, her activity has been  limited due to pain in the left hip.  She had a prior right total hip  arthroplasty that was complicated by postoperative pulmonary embolism,  also nearly fatal.  With her limited activity, she notes no dyspnea on  exertion.  She has had no orthopnea nor PND.   PHYSICAL EXAMINATION:  GENERAL:  Pleasant overweight woman in no acute  distress.  She is walking with a cane.  VITAL SIGNS:  The weight is 194, 1 pound more than in January.  Blood  pressure 130/85, heart rate 80 and regular, respirations 16 and  unlabored.  NECK:  No jugular venous distention; no carotid bruits.  LUNGS:  Early inspiratory rales at the left base is clear with cough.  CARDIAC:  Normal first and second heart sounds; scratchy basilar  systolic ejection murmur; no third nor fourth heart sounds appreciated.  ABDOMEN:  Soft and nontender; no organomegaly; no bruits.  EXTREMITIES:  No edema; normal distal pulses.   Most recent laboratories from March.  CBC shows hemoglobin of 10.8 and  hematocrit of 33.5, that is similar to previous values.  Her glucose is  174, BUN 41, and creatinine 1.4.  LFTs were normal.   IMPRESSION:  Ms. Diss is stable with respect to her cardiac issues.  Congestive heart failure is compensated.  Hypertension is controlled.  Lipid profile was good when last checked in November 2009.   We will continue her current medications and monitor CBC as well as a  chemistry profile.  A left hip x-ray will be obtained in anticipation of  her visiting one of her orthopedic doctors.  She is obviously suboptimal  candidate for second total hip arthroplasty, but perhaps something can  be done with anti-inflammatory injections or other modalities.  I will  plan to reevaluate this nice woman in 3 months.    Sincerely,      Gerrit Friends. Dietrich Pates, MD, [facility]  Electronically Signed    RMR/MedQ  DD: 10/23/2008  DT: 10/24/2008  Job #: [id number]

## 2010-11-05 NOTE — Procedures (Signed)
[facility]                              EXERCISE TREADMILL   MAYDELIN, DEMING                        MRN:          [id number]  DATE:06/02/2008                            DOB:          06-10-1946    INDICATIONS FOR STUDY:  History of chest pain, congestive heart failure,  rule out ischemic heart disease 414.01   Baseline blood pressure is 138/78 with a heart rate of 70.  Baseline EKG  shows a left bundle.   Adenosine was administered without complications.  Blood pressure was  stable.   EKG interpretations is indeterminate secondary to left bundle-branch  block pattern with significant conduction delay.   CONCLUSION:  1. The patient tolerated the adenosine stress without difficulty.  2. Indeterminate EKG.  3. Myoview pending.     Thomas C. Daleen Squibb, MD, [facility]  Electronically Signed    TCW/MedQ  DD: 06/02/2008  DT: 06/03/2008  Job #: [id number]

## 2010-11-05 NOTE — Procedures (Signed)
NAME:  [name], TALSMA NO.:  [id number]   MEDICAL RECORD NO.:  [id number]          PATIENT TYPE:  OUT   LOCATION:  RAD                           FACILITY:  [facility]   PHYSICIAN:  Gerrit Friends. Dietrich Pates, MD, FACCDATE OF BIRTH:  1945/07/30   DATE OF PROCEDURE:  06/08/2007  DATE OF DISCHARGE:  06/08/2007                                ECHOCARDIOGRAM   CLINICAL DATA:  A 65 year old woman with cardiomyopathy.  M-mode aorta  2.9, left atrium 4.5, septum 1.9, posterior wall 1.5, LV diastole 5.5,  LV systole 4.8.   1. Technically suboptimal due to malfunction of the echocardiographic      system.  2. Mild left atrial enlargement; normal right atrial size.  3. Normal right ventricular size and function; mild RVH.  4. Normal diameter of the proximal ascending aorta; mild calcification      of the wall and annulus.  5. Mild sclerosis of a trileaflet aortic valve.  6. Normal mitral valve; mild annular calcification; mild      regurgitation.  7. Normal tricuspid valve; mild regurgitation; very mild elevation in      estimated RV systolic pressure.  8. Normal proximal pulmonary artery; pulmonic valve not ideally      imaged, but is grossly normal.  9. Mild left ventricular dilatation; mild LVH; moderate to severe      hypokinesis of the anteroseptal region; there is apical hypokinesis      as well; overall LV systolic function is moderately impaired with      an estimated ejection fraction of 0.35.  10.Minimal pericardial effusion.  11.Normal IVC.  12.Comparison with prior study of April 10, 2006:  Interval decrease      in left atrial size and the magnitude of mitral regurgitation; LV      systolic function has improved.  No tricuspid valve - associated      mass identified on the present examination.      Gerrit Friends. Dietrich Pates, MD, [facility]  Electronically Signed     RMR/MEDQ  D:  06/10/2007  T:  06/10/2007  Job:  [id number]

## 2010-11-05 NOTE — Discharge Summary (Signed)
NAMEQUETZAL, MEANY                 ACCOUNT NO.:  [id number]   MEDICAL RECORD NO.:  [id number]          PATIENT TYPE:  INP   LOCATION:  A316                          FACILITY:  [facility]   PHYSICIAN:  Mila Homer. [name], M.D.DATE OF BIRTH:  20-Apr-1946   DATE OF ADMISSION:  04/09/2008  DATE OF DISCHARGE:  LH                               DISCHARGE SUMMARY   This 65 year old woman was admitted to the hospital with pulmonary  edema.  She had benign 3-day hospitalization extending from October 19  to April 12, 2008.  Vital signs remained stable.   Her admission blood gas in the ED on 1 liter and 8 of PEEP showed a pH  7.23, pCO2 of 43, pO2 of 315, bicarb 17.  She had a white cell count  6800, H&H 11.9 and 34.6, platelet count 232,000.  BNP was 1320.  Admission glucose is 340, BUN 30, creatinine 1.3.   She had cardiac panel x3 that showed troponins of 0.09, 0.10, and 0.08  and low CK-MBs.   Admission chest x-ray showed cardiomegaly with diffuse bilateral  infiltrates, right greater than left, question pulmonary edema versus  infection.  Two days later chest x-ray showed resolution of her  interstitial edema and persistent cardiomegaly without failure, minimal  bibasilar atelectasis.   She was admitted to the hospital to the ICU.  Put on an IV of 1/2 normal  saline, KVO, nitroglycerin drip through the ER, O2 by nasal cannula at 3  liters.  Put her on a 2000-calorie, 2 g sodium, ADA diet, strict I's and  O's, and daily weights.  Foley catheter placed.   She had Lasix IV in the ER and followed by Lasix 20 mg IV in the ICU.  She is also on Lovenox subcu 40 mg daily, metformin 1000 mg b.i.d.,  carvedilol 25 mg b.i.d., Actos 30 mg daily, glimepiride 4 mg daily,  pravastatin 20 mg daily, meloxicam 15 mg daily, Kay Ciel 10 mEq daily,  and furosemide 80 p.o. in the a.m.  She was also put on sliding scale  Humalog insulin.  Had Accu-Cheks a.c. and h.s.   The next day she was put on Lasix 40 IV.   Nitroglycerin drip was  discontinued.  Her furosemide was changed to 80 in the a.m. and 40 in  the p.m.Marland Kitchen   She did well on this regimen.  She was able to be discharged from the  ICU her 2nd day and was ready for discharge home her 3rd.   DISCHARGE MEDICATIONS:  As included.   FINAL DISCHARGE DIAGNOSES:  1. Congestive heart failure with pulmonary edema.  2. Cardiomyopathy.  3. History of poorly controlled type 2 diabetes.  4. Left bundle branch block.  5. 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  6. History of pulmonary emboli following total hip replacement in      2007.  7. Hypercholesterolemia.   I have discussed all of this in detail with the patient.  She will be  checking a.m. weights,  empty bowel and bladder and stomach and with  the  same clothing, and write these on a calendar.  We will increase her  furosemide from 80 mg 1/2 tablet b.i.d. which she was on to a full  tablet in the morning and 1/2 tablet at night, and will add lisinopril  10 mg daily, 30, RF x2.  Will keep on with metformin 1000 mg b.i.d.,  spironolactone 25 mg daily, carvedilol 25 mg b.i.d., Actos 30 mg daily,  glimepiride 4 mg daily, pravastatin 20 mg daily, meloxicam 15 mg daily,  KCl 10 mEq daily.  Will recheck in 2 weeks and get a MET-7 at that time.  I have discussed the adverse effects of Actos on fluid retention with  both the patient and her husband, and if she has a problem with keeping  fluid off will have to DC the Actos and treat the diabetes with other  medication.      Mila Homer. [name], M.D.  Electronically Signed     SDK/MEDQ  D:  04/12/2008  T:  04/12/2008  Job:  [id number]

## 2010-11-05 NOTE — Consult Note (Signed)
NAMEMEG, NIEMEIER NO.:  [id number]   MEDICAL RECORD NO.:  [id number]          PATIENT TYPE:  INP   LOCATION:  IC09                          FACILITY:  [facility]   PHYSICIAN:  Gerrit Friends. Dietrich Pates, MD, FACCDATE OF BIRTH:  02-21-1946   DATE OF CONSULTATION:  04/10/2008  DATE OF DISCHARGE:                                 CONSULTATION   REFERRING PHYSICIAN:  Mila Homer. [name], MD   PRIMARY CARDIOLOGIST:  Gerrit Friends. Dietrich Pates, MD, [facility]   HISTORY OF PRESENT ILLNESS:  A 65 year old woman with a nonischemic  cardiomyopathy who presents with recurrent congestive heart failure.  Ms. Blue has been generally well since she developed pulmonary  embolism following total hip arthroplasty in 2007 and then later in the  year developed an infected AICD that required removal and prolonged  hospitalization.  She was most recently admitted to hospital with  congestive heart failure in October 2009 and responded well to medical  therapy at that time.  She has had subsequent problems with GI bleeding  and anemia and has been followed by Dr. Jena Gauss.  She was also admitted  over the past few years with diabetes out of control and  hyperosmolarity.  She also responded well to medical therapy at that  time.  She is most recently evaluated in our office in June, at which  time, she was free of symptoms of congestive heart failure.  She  subsequently did fine until the evening of admission when she developed  a sudden onset of dyspnea prompted transport to the emergency department  and admission with congestive heart failure.  She was recently seen in  her physician's office with a weight of 198.  Her weight in June in my  office was 188.  She has not been weighing herself.  She has not noted  any significant increase in mild chronic edema.  Her dyspnea on  presentation resolved quickly after intravenous furosemide administered  by EMS in a 2 L diuresis.   MEDICATIONS PRIOR TO ADMISSION:  1. Metformin 1000 mg b.i.d.  2. Spironolactone 25 mg daily.  3. Furosemide total of 80 mg per day.  4. Carvedilol 25 mg b.i.d.  5. Actos 30 mg daily.  6. Glimepiride 4 mg daily.  7. Pravastatin 20 mg daily.  8. Meloxicam 15 mg daily.  9. KCl 10 mEq daily.   She has no known medical allergies.   Past medical history is otherwise notable for;  1. Hyperlipidemia.  2. Chronic left bundle-branch block.  3. History of hypertension.  4. DJD.  5. Sleep apnea.   She underwent cardiac catheterization in 1998, revealing nonobstructive  coronary disease.   SOCIAL HISTORY:  Married and lives locally; disabled; occasionally  smokes a cigarette.  No excessive use of alcohol.   FAMILY HISTORY:  Mother had diabetes; father had coronary disease;  sister suffered myocardial infarction in her 22s.   REVIEW OF SYSTEMS:  Follows a diabetic diet; underwent hysterectomy in  1982.  She has complete dentures.  All other systems reviewed and are  negative.   PHYSICAL EXAMINATION:  GENERAL:  On exam, a pleasant, comfortable woman.  VITAL SIGNS:  The temperature is 98, heart rate 60 and regular,  respirations 12, blood pressure 115/60, weight 88.8 kg, O2 saturation  96% on 3 L.  I and O negative 2 L yesterday.  HEENT:  EOMs full; normal oral mucosa; normal lids, and conjunctivae.  NECK:  Mild jugular venous distention; normal carotid upstrokes without  bruits.  LUNGS:  Bibasilar rales.  CARDIAC:  Normal first and second heart sounds; prominent splitting of  the second heart sounds; modest systolic murmur.  No left ventricular  lift.  ABDOMEN:  Soft and nontender; normal bowel sounds; no organomegaly.  SKIN:  No significant lesions.  EXTREMITIES:  1/2+ ankle edema; normal distal pulses.  NEUROLOGIC:  Symmetric strength and tone; normal cranial nerves.   EKG:  Normal sinus rhythm; left bundle-branch block; and left axis  deviation.   Chest x-ray:  Cardiomegaly; bilateral pulmonary edema.    Other laboratory notable for a normal chemistry profile except for  glucose 340, normal CBC, BNP level of 1320 and a cardiac markers notable  for normal CK, normal CK-MB, but minimal troponin elevations of 0.1 and  0.9 on the first 2 sets.   IMPRESSION:  Ms. Braunschweig has a known cardiomyopathy and chronic left  bundle-branch block and now presents with recurrent congestive heart  failure.  Based upon her weight of a few days ago, she most likely was  gradually accumulated fluid and subsequently suddenly developed  pulmonary edema.  She is markedly improved after initial diuresis.  We  will adjust her medications.  I do not anticipate  need for extensive  cardiology testing or prolonged hospital stay.  We appreciate the  request for consultation and we will be happy to follow this nice woman  with you.      Gerrit Friends. Dietrich Pates, MD, [facility]  Electronically Signed     RMR/MEDQ  D:  04/10/2008  T:  04/11/2008  Job:  [id number]

## 2010-11-05 NOTE — H&P (Signed)
NAMESIGOURNEY, PORTILLO NO.:  [id number]   MEDICAL RECORD NO.:  [id number]          PATIENT TYPE:  INP   LOCATION:  IC09                          FACILITY:  [facility]   PHYSICIAN:  Mila Homer. [name], M.D.DATE OF BIRTH:  1946/01/01   DATE OF ADMISSION:  04/09/2008  DATE OF DISCHARGE:  LH                              HISTORY & PHYSICAL   This 65 year old woman developed sudden dyspnea on the night of  admission.  She was brought to the emergency room.   She has a long history of diabetes, hypertension, and coronary artery  disease.   She was seen just 4 days prior to admission in the office.  At that  time, she gained some weight, felt to be fat by her.  She has been on  glimepiride 4 mg daily, Actos 30 mg daily, metformin 1000 mg b.i.d. for  her diabetes, carvedilol 25 mg b.i.d., furosemide 80 mg half tablet  b.i.d., pravastatin 20 mg daily, meloxicam 15 mg daily, and KCl 10 mEq  daily.   REVIEW OF SYSTEMS:  She complains of no shortness of breath or ankle  edema.  Review of systems is otherwise negative.   PHYSICAL EXAMINATION:  VITAL SIGNS:  Blood pressure 161/100, pulse of  99, and respiratory rate 32.  GENERAL:  She is dyspneic.  At the time of my exam, after diuresis, she  is much improved.  She is in no acute distress, well developed, well  nourished, awake, and alert.  SKIN:  Color is good.  Mucous membranes are moist.  Skin turgor is  normal.  HEART:  Regular rhythm.  Rate about 70.  Heart sounds somewhat distant.  LUNGS:  Decreased breath sounds.  She is moving air well.  No  intercostal retraction.  No use of accessory muscle respiration.  ABDOMEN:  Soft, mildly obese, without organomegaly or mass.  EXTREMITIES:  Again, at the time of my exam, there is no edema of the  ankles.   LABORATORY DATA:  Admission white cell count 6800, H and H 11.9 and  34.6, MCV of 92, and platelet count 236,000.  Her BNP was 1320.  ABG on  1 L showed pH 7.237, pCO2 43,   pO2 315, and bicarb 7.  ISTAT showed a  glucose of 340, BUN 30, and creatinine 1.3.  Cardiac markers initially  showed myoglobin 48, troponin less than 0.05, and CK-MB less than 1.   Chest x-ray showed bilateral diffuse infiltrates and cardiomegaly.  EKG  showed a normal sinus rhythm.  Left bundle-branch block and left axis  deviation noted.   ADMISSION DIAGNOSES:  1. Flash pulmonary edema.  2. Congestive heart failure.  3. Cardiomyopathy.  4. 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  5. History of pulmonary emboli following total hip replacement back in      2007.  6. Poorly controlled type 2 diabetes mellitus.  7. Left bundle-branch block.   The patient looked much better after diuresis.  I have talked with her  and her husband at length about diet, cutting  back on her fluids, and  again daily weights, keep track of whether she is retaining fluid.  The  patient drinks fluids she states because of thirst or maybe due to  poorly controlled diabetes, however, and also maybe habit, we discussed  that.  Meanwhile, she should be seen by her cardiologist, Dr. Dietrich Pates,  and a cardiac echo is pending as is the repeat chest x-ray.  Presently,  we will keep her on Actos, metformin and glimepiride, however, we may  need to stop her Actos if she has any further problem. She will continue  on 2000-calorie ADA, 2-g sodium diet, daily weights, I's and O's, and  Foley catheter.      Mila Homer. [name], M.D.  Electronically Signed     SDK/MEDQ  D:  04/10/2008  T:  04/11/2008  Job:  [id number]

## 2010-11-05 NOTE — Letter (Signed)
July 04, 2008    [name]. Tina Bailey, MD  [address]  [place], [place] [address]   RE:  Tina, [name]  MRN:  [id number]  /  DOB:  09/11/1945   Dear Brett Canales,   [name] returns to the office for continued assessment and treatment  of severe cardiomyopathy, presumed nonischemic.  She underwent a nuclear  stress test that was nondiagnostic.  She got extensive scarring, but the  possibility of some ischemia in the inferior wall.  I do not think this  merits further testing or coronary angiography.  She remains  significantly disabled, but relatively functional.  She has dyspnea with  mild-to-moderate exertion.  She has no chest discomfort.  She has  experienced no lightheadedness and certainly no syncope.   Medications are unchanged from her last visit except for discontinuation  of Actos and Darvocet.  Of note, there is a use of metformin at moderate  doses of 1000 mg b.i.d.   PHYSICAL EXAMINATION:  GENERAL:  Well-appearing woman in no acute  distress.  VITAL SIGNS:  Her weight is 193, essentially unchanged.  Blood pressure  120/75, heart rate 70 and regular, respirations 12 and unlabored.  NECK:  No jugular venous distention; no carotid bruits.  LUNGS:  Clear.  CARDIAC:  Normal first and second heart sounds; grade 2/6 nearly  holosystolic murmur heard across the precordium.  ABDOMEN:  Soft and nontender; no organomegaly.  EXTREMITIES:  Trace edema.   Recent laboratory is good with normal electrolytes, a glucose of 137, a  BUN of 47, and a creatinine of 1.5.  BNP level is 169.  A1c level is  6.2.   IMPRESSION:  [name] is doing generally well.  Renal function is  borderline to permit her to take metformin.  This should be followed  carefully with use of an alternative agent, if necessary.  Congestive  heart failure is relatively well compensated.  We will continue her  current medications including her ability to increase her dose of  furosemide when she notices  edema or increasing weight or dyspnea.  Her  dose of lisinopril will be increased to 20 mg daily.  I will reassess  this nice woman in 4 months.  Laboratory studies will be repeated in 2  months.    Sincerely,      Gerrit Friends. Dietrich Pates, MD, [facility]  Electronically Signed    RMR/MedQ  DD: 07/04/2008  DT: 07/05/2008  Job #: [id number]

## 2010-11-05 NOTE — Assessment & Plan Note (Signed)
[facility]                       [facility] OFFICE NOTE   [name], [name]                        MRN:          [id number]  DATE:04/22/2007                            DOB:          07/12/1945    [name] returns to the office six months beyond her anticipated  visit. She has had financial issues of late and has not received much  medical care. She was discharged from the [place] practice due to  inability to pay her bills. She was given insulin on the way out of the  door, but the prescription for that has been exhausted. Her glucose has  been as high as 700 in the past few months. The value she now reports at  home is around 200 to 300.   She reports chest tightness. This has been present over the past two  weeks and has been mostly constant. There is no relationship to  exertion. There is no exertional dyspnea, PND, nor orthopnea.   CURRENT MEDICATIONS:  1. Digoxin 0.25 mg daily.  2. Metformin 1000 mg b.i.d.  3. Carvedilol 25 mg b.i.d.  4. Spironolactone 25 mg daily.  5. Furosemide 40 mg b.i.d.  6. Enalapril 10 mg b.i.d.   PHYSICAL EXAMINATION:  GENERAL:  Pleasant well appearing woman.  VITAL SIGNS:  Weight 179 pounds, 9 pounds increased from 9 months ago.  Blood pressure 125/65, heart rate 75 and regular, respirations 16.  NECK:  No jugular venous distension; normal carotid upstrokes without  bruits.  LUNGS:  Clear.  CARDIAC:  Normal first and second heart sounds; modest basilar systolic  ejection murmur.  ABDOMEN:  Soft and nontender; no organomegaly; no masses.  EXTREMITIES:  Trace edema; normal distal pulses.   IMPRESSION:  [name] requires a much closer follow up. I will refer  her to Drs. Jen Mow and Erby Pian for medical care. She will likely require  insulin for control of diabetes, but I will add Glimepiride 2 mg daily  for now. Medications for cardiomyopathy are generally good. A chemistry  profile, CBC, and  hemoglobin a1C level will be obtained. If results are  good, I will plan to see this nice woman again in four months.     Gerrit Friends. Dietrich Pates, MD, [facility]  Electronically Signed    RMR/MedQ  DD: 04/22/2007  DT: 04/23/2007  Job #: [id number]

## 2010-11-05 NOTE — Letter (Signed)
August 09, 2007    Erle Crocker, M.D.  [address], [address]  [place], [place]  [address]   RE:  [name], [name]  MRN:  [id number]  /  DOB:  1945-10-04   Dear Wynona Canes:   [name] returns to the office for continued assessment and treatment  of cardiomyopathy, hypertension and dyslipidemia.  Since I last saw her,  she has done fairly well.  She performs her usual daily activities  without difficulty.  She develops fatigue and dyspnea if she walks  moderate distances.  She has had no orthopnea nor PND.  She has not  noted pedal edema.  Appetite has been good.  She has not been monitoring  her weight.   CURRENT MEDICATIONS:  1. Digoxin 0.25 mg daily.  2. Metformin 1000 mg b.i.d.  3. Carvedilol 25 mg b.i.d.  4. Spironolactone 25 mg daily.  5. Furosemide 40 mg b.i.d.  6. Enalapril 10 mg b.i.d.  7. Glimepiride 2 mg daily.  8. Aspirin 81 mg daily.   PHYSICAL EXAMINATION:  GENERAL:  Well-appearing woman.  VITAL SIGNS:  The weight is 190, 11 pounds more than in October.  Blood  pressure 140/80, heart rate 72 and regular, respirations 16.  NECK:  No jugular venous distention; no carotid bruits.  LUNGS:  Clear.  CARDIAC:  Normal first and second heart sounds; scratchy basilar  systolic ejection murmur.  ABDOMEN:  Soft and nontender; no organomegaly.  EXTREMITIES:  Trace edema; distal pulses intact.   Most recent polysomnography suggested presence of sleep apnea.  Apparently, treatment with a positive pressure mask is planned.  She had  mild anemia in January with a hemoglobin of 11.3, hematocrit of 35 and a  normal MCV.  Electrolytes and renal function were normal.  Glucose was  193.  LFTs were normal.   An echocardiogram was repeated in December.  This demonstrated  substantial recovery of left ventricular systolic function.  Estimated  ejection fraction is now 0.35.  No significant valvular abnormalities  were appreciated.  This represents a recovery from an ejection  fraction  of 10% in October 2007.   IMPRESSION:  [name] is dramatically improved.  Her weight gain  probably represents true body weight, not fluid.  Medical regime appears  optimal.  We will obtain appropriate monitoring laboratories and plan a  return office visit in 4 months.    Sincerely,      Gerrit Friends. Dietrich Pates, MD, [facility]  Electronically Signed    RMR/MedQ  DD: 08/09/2007  DT: 08/10/2007  Job #: [id number]

## 2010-11-05 NOTE — Assessment & Plan Note (Signed)
[facility]                       [place] [facility] OFFICE NOTE   [name], [name]                        MRN:          [id number]  DATE:05/29/2008                            DOB:          1946/04/24    CARDIOLOGIST:  Gerrit Friends. Dietrich Pates, MD, [facility].   PRIMARY CARE PHYSICIAN:  Dr. John Giovanni.   REASON FOR VISIT:  A 38-month followup.   HISTORY OF PRESENT ILLNESS:  [name] is a 65 year old female patient  with a history of nonischemic cardiomyopathy and normal coronaries by  catheterization in 1998 who presents to [place] today for followup.  She had  a recent admission for acute-on-chronic systolic congestive heart  failure.  Followup echocardiogram at that time demonstrated an EF of 10-  15%.  It has previously been rated at 35%.  Since last being seen, the  patient has done well without chest pain.  She does note dyspnea with  exertion.  She describes NYHA class IIB symptoms.  She denies orthopnea  or PND.  She does feel that her abdominal girth has increased in last  few days.  She denies any worsening pedal edema.  She denies any syncope  or palpitations.  She denies any significant cough.   CURRENT MEDICATIONS:  Metformin 1 g b.i.d., Meloxicam 15 mg daily,  Glimepiride 4 mg daily, Pravastatin 20 mg daily, Spirolactone 25 mg  daily,  Darvocet-N 100 b.i.d., Lisinopril 10 mg daily, Carvedilol 25 mg b.i.d.,  Furosemide 80 mg half tablet b.i.d., Multivitamin,  Actos 30 mg daily.   PHYSICAL EXAMINATION:  GENERAL:  She is a well-nourished and well-  developed female, in no distress.  VITAL SIGNS:  Blood pressure is 136/68, pulse 74, and weight 194 pounds.  HEENT:  Normal.  NECK:  Without JVD.  CARDIAC:  Normal S1 and S2.  Positive S3.  Regular rate and rhythm.  LUNGS:  Decreased breath sounds bilaterally.  No rales.  ABDOMEN:  Soft, nontender.  EXTREMITIES:  With trace 1+ ankle edema bilaterally.  NEUROLOGIC:  She is alert and oriented  x3.  Cranial nerves II through  XII grossly intact.   ASSESSMENT AND PLAN:  1. Chronic systolic congestive heart failure secondary to nonischemic      cardiomyopathy with an ejection fraction of 10-15%.  She did have a      cardiac catheterization in 1998 that demonstrated normal coronary      arteries.  She has not had an ischemic evaluation in quite some      time.  I discussed her case further with Dr. Dietrich Pates and at this      point, we recommend proceeding with Myoview testing to rule out the      possibility of ischemic heart disease contributing to her depressed      left ventricular function.  Her exam is notable for an S3 and her      weight is up 4 pounds since her last visit.  I have asked her to      increase her Lasix to 80 mg in the morning and 40 mg in the evening  for 3 days, then go back to 40 mg b.i.d.  I have also explained to      her that she is to weigh herself daily and increase her Lasix by 40      mg on days that her weight has gone up 3 pounds or more in a 24-      hour period.  She understands this.  2. 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  3. Dyslipidemia, treated with pravastatin.  4. Diabetes mellitus.  She will continue to follow with primary care      physician.  5. Hypertension, this is well controlled.  6. Renal insufficiency.  Her most recent blood work demonstrated a      creatinine of 1.44.  We will obtain a BMET at the time of her      Myoview to follow up on her renal function and potassium.   DISPOSITION:  The patient will be brought back to follow up with Dr.  Dietrich Pates in next 4-6 weeks or sooner p.r.n.  An electrocardiogram will  be obtained prior to her discharge in the office today.      Tereso Newcomer, PA-C  Electronically Signed      Gerrit Friends. Dietrich Pates, MD, [facility]  Electronically Signed   SW/MedQ  DD: 05/29/2008  DT: 05/30/2008  Job #:  [id number]   cc:   Mila Homer. [name], M.D.

## 2010-11-05 NOTE — Group Therapy Note (Signed)
NAMEMARAYAH, HIGDON NO.:  [id number]   MEDICAL RECORD NO.:  [id number]          PATIENT TYPE:  INP   LOCATION:  IC09                          FACILITY:  [facility]   PHYSICIAN:  Mila Homer. [name], M.D.DATE OF BIRTH:  May 30, 1946   DATE OF PROCEDURE:  DATE OF DISCHARGE:                                 PROGRESS NOTE   She is feeling much better today.  There was no shortness of breath.   OBJECTIVE:  VITAL SIGNS:  Blood pressure is 123/59, pulse 64,  temperature on 97.6, and O2 sat is 96% with 3 L.  GENERAL:  She is supine in bed, in no acute distress.  Well-developed,  well-nourished oriented and alert.  LUNGS:  Clear throughout today.  There are intercostal retractions.  No  use accessory muscle respiration.  HEART:  Regular rhythm, rate of 70.  EXTREMITIES:  There is no edema of the ankles.   Glucose 108 and 146, troponin 0.08, but the CK was only 58 and CK-MB  only 2.8.   ASSESSMENT:  1. Congestive heart failure.  2. Well-controlled type 2 diabetes.   PLAN:  Reducing her IV, switch to Hep-Lock, taking her Foley out and  transferring her from the ICU to a monitored bed on the regular medical  floor today.  Given the fact that she diuresed nicely, we are going to  continue on her Actos 30 mg daily and watch this carefully.      Mila Homer. [name], M.D.  Electronically Signed     SDK/MEDQ  D:  04/11/2008  T:  04/11/2008  Job:  [id number]

## 2010-11-05 NOTE — Assessment & Plan Note (Signed)
[facility]                       [place] [facility] OFFICE NOTE   TULLY, BURGO                        MRN:          [id number]  DATE:05/01/2008                            DOB:          October 04, 1945    CARDIOLOGIST:  Gerrit Friends. Dietrich Pates, MD, [facility].   PRIMARY CARE PHYSICIAN:  Mila Homer. [name], MD.   REASON FOR VISIT:  Post-hospitalization followup.   HISTORY OF PRESENT ILLNESS:  [name] is a 65 year old female patient  with a history of nonischemic cardiomyopathy with an EF previously  documented at 35% in December 2008, and normal coronary arteries by  cardiac catheterization in February 1998, who recently presented to  [facility] with acute-on-chronic systolic congestive heart  failure.  She was diuresed in the hospital and underwent followup  echocardiography testing.  This demonstrated an EF of 10-15%, which was  reduced from her previous ejection fraction.  Her medications were  adjusted with reinitiation of ACE inhibitor.  We had planned on  restarting spironolactone as an outpatient.  However, that already had  been added back to her medical regimen.  She actually had followup labs  today arranged through her primary care physician.  This does include a  CMET and those results are currently pending.  Since discharge, the  patient notes that she is doing well without chest pain or significant  shortness of breath.  She is somewhat limited by hip arthritis.  She  describes NYHA class II to III symptoms.  She denies orthopnea or PND.  She denies significant pedal edema.  She denies syncope.   CURRENT MEDICATIONS:  Metformin 1 g b.i.d., Meloxicam 15 mg daily,  Glimepiride 4 mg daily, Pravastatin 20 mg daily, Spirolactone 25 mg  daily, Darvocet-N 100 b.i.d., Lisinopril 10 mg daily,  Carvedilol 25 mg b.i.d., Furosemide 80 mg half tablet b.i.d.,  Multivitamin daily, Actos 30 mg daily.   ALLERGIES:  No known drug allergies.   PHYSICAL EXAMINATION:  GENERAL:  She is a well-nourished and well-  developed female in no distress.  VITAL SIGNS:  Blood pressure is 128/72, pulse 64, and weight 190 pounds.  She notes her discharge weight was 188.  Her prior weight in June 2009,  was 188.  HEENT:  Normal neck without JVD at 45 degrees.  CARDIAC:  Normal S1 and S2.  Regular rate and rhythm without murmur.  LUNGS:  Clear to auscultation bilaterally.  ABDOMEN:  Soft and nontender.  EXTREMITIES:  Without edema.  NEUROLOGIC:  She is alert and oriented x3.  Cranial nerves II-XII  grossly are intact.   ASSESSMENT AND PLAN:  1. Chronic systolic congestive heart failure secondary to nonischemic      cardiomyopathy with an EF of 10-15%.  As outlined previously, her      EF has decreased from 35 to 10-15%.  She does have diabetes      mellitus and is at risk for development of coronary artery disease.      When she was discharged from the hospital, we had planned to      continue to adjust her medications before  deciding on ischemic      evaluation.  I will discuss this further with her primary      cardiologist, Dr. Dietrich Pates before we proceed with any Myoview      testing.  We will try to obtain her labs that were drawn today.  We      will also get a BNP added to those.  If her BNP is stable, I would      suggest that she remain at 40 mg twice a day of her Lasix.  She was      to take 80 mg in the morning and 40 mg in the evening.  However, at      this point in time, she is optivolemic, and I would only up titrate      her diuresis should she have significant weight gain, swelling, or      shortness of breath.  If her creatinine is stable, we can certainly      consider increasing her lisinopril to 20 mg a day.  Otherwise, her      medical regimen looks good.  She had previously been on Lanoxin in      the past.  She is currently not on that for unclear reasons but her      heart rate is in the 60s, and I would not add that  at this point in      time.  2. 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  3. Dyslipidemia.  She will continue on pravastatin.  4. Diabetes mellitus.  She will continue followup with primary care      physician.  5. Hypertension.  This is overall well controlled.   DISPOSITION:  The patient will be brought back in followup with Dr.  Dietrich Pates in the next 1 month or sooner p.r.n.      Tereso Newcomer, PA-C  Electronically Signed      Jonelle Sidle, MD  Electronically Signed   SW/MedQ  DD: 05/01/2008  DT: 05/02/2008  Job #: [id number]   cc:   Gerrit Friends. Dietrich Pates, MD, [facility]  Mila Homer. [name], M.D.

## 2010-11-05 NOTE — Letter (Signed)
November 28, 2008    Robert A. Thurston Hole, MD  [address]  [address]  [place], [place] [address]   RE:  CATLIN, [name]  MRN:  [id number]  /  DOB:  1946/03/18   Dear Reita Cliche:   Ms. Haecker is patient of ours, who underwent a right total hip  arthroplasty in 2007.  That was complicated by pulmonary embolism.  She  also has a cardiomyopathy that has progressively worsened over the  years, most recently with an ejection fraction of 10-15% and an episode  of acute pulmonary edema.  She has experienced increasing pain in her  left hip such that she is now barely able to walk.  She was recently  evaluated by Dr. Romeo Apple, who recommended left THA and referral back to  you.   Current medications are listed in Dr. Mort Sawyers note, a copy of which  has been forwarded to you.   Recent laboratory includes a mild anemia with hemoglobin of 11 and mild  renal insufficiency with creatinine of 1.4.  She also has mild diabetes  with a serum glucose of 174.  A1c level was good at 6.2 in January.   Additional details of her history are available in a copy of my previous  note that has been forwarded to you.  Unfortunately, she is a very dicey  candidate for major orthopedic surgery.  I would be most appreciative if  you could find therapy that will be of benefit to her, but that will not  involve total hip arthroplasty.  She also is not a good candidate for  nonsteroidals due to her renal insufficiency and cardiac disease.  I am  very reluctant to recommend surgery for her under  any circumstances.  I would be inclined to pursue conservative therapy  until her pain is unbearable or she is totally unable to walk.  Then, we  could revisit the issue of surgical intervention.  Thank you so much for  seeing this difficult patient in followup.    Sincerely,      Gerrit Friends. Dietrich Pates, MD, [facility]  Electronically Signed    RMR/MedQ  DD: 11/28/2008  DT: 11/29/2008  Job #: [id number]   CC:    Vickki Hearing,  M.D.

## 2010-11-08 NOTE — Consult Note (Signed)
NAME:  [name], [name] NO.:  [id number]   MEDICAL RECORD NO.:  [id number]          PATIENT TYPE:  INP   LOCATION:  A222                          FACILITY:  [facility]   PHYSICIAN:  Hardin Bing, M.D. LHCDATE OF BIRTH:  [birth year]   DATE OF CONSULTATION:  10/20/2005  DATE OF DISCHARGE:                                   CONSULTATION   REFERRING PHYSICIAN:  Madelin Rear. Sherwood Gambler, M.D.   HISTORY OF PRESENT ILLNESS:  65 year old woman, with approximately a 2-year  history of nonischemic cardiomyopathy, presents with cough, pleuritic chest  pain and sepsis.  Ms. Sabina has had LV dysfunction with malaise and dyspnea  but no acute pulmonary edema.  She was found to have an ejection fraction of  20 - 25% and underwent implantation of an AICD in December 2005.  She has  chronic left bundle branch block, hypertension and diabetes, but despite  these multiple risk factors, did not have significant coronary disease.  She  was doing well until she underwent a right total hip replacement in February  of this year.  She subsequently returned with extensive pulmonary embolism  and was started on anticoagulants.  She was discharge from the hospital a  few weeks ago and initially did well, but subsequently developed malaise,  left-sided pleuritic chest discomfort and chills with a nonproductive cough,  prompting evaluation in the emergency department where she was found to be  febrile.  Blood cultures have grown Enterococcus..  A repeat CT scan showed  the old pulmonary embolism with no new events and with questionable mild  consolidation in a number of pulmonary segments.   Past medical history is otherwise notable for a prior hysterectomy.  The  patient has no known allergies. Current medications are listed in the chart.   SOCIAL HISTORY:  Updated; no significant recent changes.  Rare tobacco use  without any excessive alcohol use.   FAMILY HISTORY:  One sibling with coronary  disease.   REVIEW OF SYSTEMS:  Notable for swelling and pain in both knees, which has  been present for some time.   PHYSICAL EXAMINATION:  GENERAL: Pleasant woman appearing chronically ill and  thinner than in the past.  VITAL SIGNS: The temperature is 97, heart rate 105 and regular, respirations  18, blood pressure 130/65, weight 156, O2 saturation 96% on oxygen.  HEENT:  Anicteric sclerae; normal lids and conjunctiva.  NECK:  No jugular venous distension; normal carotid upstrokes without  bruits.  HEMATOPOIETIC:  No adenopathy.  ENDOCRINE:  No thyromegaly.  CARDIAC:  Rapid somewhat irregular rhythm; normal first and second heart  sounds; holosystolic murmur at the left sternal border.  LUNGS:  Bronchial breath sounds and egophony at the left base with much less  impressive findings at the right base.  ABDOMEN:  Soft and nontender; normal bowel sounds; no masses; no  organomegaly.  EXTREMITIES:  No edema; distal pulses intact.  NEUROMUSCULAR:  Mild warmth over both knees without demonstrable effusion;  right hip incision is well-healed and nontender.   LABORATORY DATA:  Notable for chest CT ruled out a  new pulmonary embolism.   Chest x-ray with improved atelectasis in the left lower lobe.   EKG showing atrial synchronous ventricular pacing.   Telemetry showing PVCs and PACs.   Normal renal function, glucose 188. Anemia with a hemoglobin of 9.6. Albumin  of 2.4.  BNP level is 47.  Digoxin is 1.4.   IMPRESSION:  Ms. Pavlovich appears to be doing well from a cardiac standpoint.  There is no congestive heart failure.  Her pacing device is functioning  normally.  She has not required nor received defibrillator discharges.   I am concerned about her infectious disease status..  Her case was discussed  with Dr. Maurice March at [facility].  Enterococcus is virtually never the  causative organism for pneumonia.  He would be more concerned about a  urinary source or endocarditis.  He  does not think infection of her hip  prosthesis is at all likely.  Urine for urinalysis and culture has been  obtained, but her urine is grossly normal, and she has no urinary tract  symptoms.  We will be happy to perform a transesophageal echocardiogram  within the next few days.  Dr. Maurice March suggests 4 weeks of antibiotics if no  definite source is identified.      [place] Bing, M.D. [facility]  Electronically Signed     RR/MEDQ  D:  10/20/2005  T:  10/21/2005  Job:  [id number]

## 2010-11-08 NOTE — H&P (Signed)
NAME:  [name], [name]                           ACCOUNT NO.:  [id number]   MEDICAL RECORD NO.:  [id number]                   PATIENT TYPE:  INP   LOCATION:  A320                                 FACILITY:  [facility]   PHYSICIAN:  R. Roetta Sessions, M.D.              DATE OF BIRTH:  July 05, 1945   DATE OF ADMISSION:  10/14/2003  DATE OF DISCHARGE:                                HISTORY & PHYSICAL   CHIEF COMPLAINT:  Hematochezia.   HISTORY OF PRESENT ILLNESS:  [name] is a pleasant 65 year old lady who  underwent an EGD and colonoscopy for epigastric pain and hematochezia on  October 13, 2003.  EGD revealed four 3-mm bulbar ulcers.  Remainder of upper  GI tract revealed no significant abnormalities.  She had recently been  treated for H. Pylori.  Colonoscopy demonstrated pedunculated lesions and  suspicious__________was biopsied, but not removed.  It was not bleeding.  There was a 3-mm polyp at 10 cm and she had left-sided diverticula.  However, there was 2-cm sessile donut-shape polyp at the base of the cecum  adjacent to the appendiceal orifice.  This was partially removed in  piecemeal fashion via saline assisted polypectomy.  The path report is  pending.  She went home and did well.  She worked all day here at the  hospital, but yesterday afternoon became diaphoretic and started passing  gross blood per rectum.  She was seen in the emergency department by Dr.  Earl Lites last evening.  She has remained hemodynamically stable.  Her  hemoglobin was 10.6 last evening and was reportedly 13 one month ago.  She  has had multiple episodes of blood per rectum here on the floor.  This  morning her H&H was 8.7 and 24.4 , MCV of 89.8 and platelet count 164,000.  She has had no pain.  Potassium was low at 3.0.  She was given potassium in  her IV fluids and it is up to 3.4 this morning.  Her pro time was 13.6.   Of note, the patient tells me she is not taking any form of nonsteroidal  agent since being  seen at colonoscopy two days ago.   PAST MEDICAL HISTORY:  Has some coronary disease, but records and specifics  are unavailable.  She is followed primarily by Dr. Sherwood Gambler.  History of  hypertension, fluid retention.   PAST SURGICAL HISTORY:  Left knee arthroscopy, hysterectomy.   RECENT OUTPATIENT MEDICATIONS:  1. Tylenol.  2. K-Dur.  3. Metoprolol.  4. Monopril.  5. Lanoxin.  6. Tenoretic.   ALLERGIES:  No known drug allergies.   FAMILY HISTORY:  No history of chronic GI or liver disease. Mother is in her  8s and living.  The patient's father died of an myocardial infarction.  No  history of colorectal neoplasia.   SOCIAL HISTORY:  The patient has been married for 41 years.  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  She  smokes cigarettes rarely.  One pack lasts her one month.  No alcohol.   REVIEW OF SYSTEMS:  No recent chest pain or dyspnea on exertion.  She has  had some diaphoresis. No fever or chills.   PHYSICAL EXAMINATION:  GENERAL:  Slightly pale 65 year old lady resting  comfortably.  VITAL SIGNS:  Temperature 97.8, pulse 73, respiratory rate 16, blood  pressure 143/74.  Admission weight 172 pounds.  SKIN:  Warm and dry.  No jaundice.  No stigmata for chronic liver disease.  HEENT:  Conjunctivae are somewhat pale.  Jugular venous distention is not  prominent.  CHEST:  Lungs are clear to auscultation.  CARDIAC:  Regular regular  rate and rhythm with soft 2/6 systolic ejection  murmur.  ABDOMEN:  Nondistended.  Positive bowel sounds.  Soft.  Nontender.  No  appreciable mass or organomegaly.   ASSESSMENT:  [name] is a pleasant 65 year old lady status post EGD,  colonoscopy on April 23.  She had a complex polyp in her cecum removed  partially in piecemeal fashion.  She now has hematochezia and has a  significant drop in her hemoglobin.  I suspect the lesion in her cecum is  bleeding.  She had tiny ulcers in her bulb on  EGD which appeared to have  been healing. She also on colonoscopy had diverticulosis and lesion  suspicious for a large __________, biopsied but not removed and a smaller  polyp in the rectum which was removed.  She needs to have colonoscopy now  with therapy to control bleeding.  This procedure was discussed with Ms.  Mulvey at length.  Potential risks, benefits and alternatives have been  reviewed. She was somewhat hypokalemic when she presented and that is being  taken care of at this time with supplementation.  She needs to have a two  unit transfusion.  I discussed this also with Ms. Nuccio and she is  agreeable.   RECOMMENDATIONS:  Will plan to give her a prep today and plan colonoscope  her early this afternoon.  Further recommendations to follow.     ___________________________________________                                         Jonathon Bellows, M.D.   RMR/MEDQ  D:  10/15/2003  T:  10/15/2003  Job:  [id number]   cc:   Madelin Rear. Sherwood Gambler, M.D.  [address]  [place]  [place] [address]  Fax: [phone]

## 2010-11-08 NOTE — Op Note (Signed)
NAME:  [name], [name] NO.:  [id number]   MEDICAL RECORD NO.:  [id number]          PATIENT TYPE:  INP   LOCATION:  2019                         FACILITY:  [facility]   PHYSICIAN:  Vickki Hearing, M.D.DATE OF BIRTH:  04-25-1946   DATE OF PROCEDURE:  10/24/2005  DATE OF DISCHARGE:                                 OPERATIVE REPORT   This is a follow-up to initial consultation done on May 3.   PROCEDURE:  Aspiration left knee.   PREOPERATIVE DIAGNOSIS:  Sepsis left knee.   POSTPROCEDURE DIAGNOSIS:  Hemarthrosis.   PROCEDURE:  Aspiration, left knee performed by Dr. Romeo Apple.   Under sterile conditions, sterile cleaning of the knee with Betadine,  we  attempted aspiration from the lateral side of the knee in two ports and on  the medial side on the third port and I was only able to obtained bloody  fluid.  There was no purulence in the fluid and no evidence of  lipohemarthrosis.   IMPRESSION AFTER THE PROCEDURE:  Hemarthrosis.   PLAN:  We will use symptomatic treatment.      Vickki Hearing, M.D.  Electronically Signed     SEH/MEDQ  D:  10/24/2005  T:  10/24/2005  Job:  [id number]

## 2010-11-08 NOTE — Op Note (Signed)
NAME:  [name], [name]                           ACCOUNT NO.:  [id number]   MEDICAL RECORD NO.:  [id number]                   PATIENT TYPE:  AMB   LOCATION:  DAY                                  FACILITY:  [facility]   PHYSICIAN:  R. Roetta Sessions, M.D.              DATE OF BIRTH:  07-26-1945   DATE OF PROCEDURE:  DATE OF DISCHARGE:                                 OPERATIVE REPORT   PROCEDURE:  Colonoscopy with biopsy/ablation.   ENDOSCOPIST:  Gerrit Friends. Rourk, M.D.   INDICATIONS FOR PROCEDURE:  The patient is a 65 year old lady who underwent  resection of a polyp in her cecum back in April.  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  There was no invasion.  Postpolypectomy  course complicated by bleeding which required therapeutic colonoscopy. She  has done well.  She is here to look at the site, once again, as I did not  feel that all of the polyp tissue was necessarily removed at the first  session.  This approach has been discussed with the patient at length.  Please see my prior H&P and my updated handwritten note.   PROCEDURE NOTE:  O2 saturation, blood pressure, pulse and respirations were  monitored throughout the entire procedure.  Conscious sedation: Versed 6 mg IV, Demerol 100 mg IV in divided doses.   INSTRUMENT:  Olympus video chip system.   FINDINGS:  A digital rectal exam revealed no abnormalities.   ENDOSCOPIC FINDINGS:  The prep was good.   RECTUM:  Examination of the rectal mucosa including the retroflex view of  the anal verge, again, revealed a large, what appeared to be anal papilla  previously biopsied and found to be inflammatory.  The remainder of the  rectal mucosa appeared okay.   COLON:  The colonic mucosa was surveyed from the rectosigmoid junction  through the left transverse and right colon to the area of the appendiceal  orifice and ileocecal valve.   From this level the scope was slowly withdrawn.  All previously mentioned  mucosal  surfaces were again seen.  The patient was noted to have left-sided  diverticula.  The area of prior polypectomy was, again, identified.  There  appeared to be some scar formation there, but it really looked like pretty  much all the polyp had been removed.  There was a slight amount of scar  tissue, versus original adenomatous material going over a fold and into the  appendix.  Please see photos.  This area was cold biopsied and it was  treated thermally with the tip of snare cautery unit.  The large appearing  anal papilla-like lesion in the rectum was, again, cold biopsied.  The  patient tolerated the procedure well.   IMPRESSION:  1. A large lesion at the anal verge consistent with anal papilla biopsied     (previously found to be an inflammatory  polyp) otherwise normal rectum.  2. Left-sided diverticula.  3. Scar formation inside a prior polypectomy site appeared adequate; treated     one area of this scar area with thermal ablation with the tip of the     snare cautery unit after cold biopsy.   I think that we are okay based on today's findings.   RECOMMENDATIONS:  1. No arthritis medications or aspirin for the next 10 days.  2. Follow up on path.  3. Further recommendations to follow.      ___________________________________________                                            Jonathon Bellows, M.D.   RMR/MEDQ  D:  12/27/2003  T:  12/27/2003  Job:  [id number]   cc:   Madelin Rear. Sherwood Gambler, M.D.  [address]  [place]  [place] [address]  Fax: [phone]   R. Roetta Sessions, M.D.  [address]  [place]  [place] [address]  Fax: [phone]

## 2010-11-08 NOTE — H&P (Signed)
NAME:  [name], [name] NO.:  [id number]   MEDICAL RECORD NO.:  [id number]          PATIENT TYPE:  INP   LOCATION:  A222                          FACILITY:  [facility]   PHYSICIAN:  Patrica Duel, M.D.    DATE OF BIRTH:  [birth year]   DATE OF ADMISSION:  10/17/2005  DATE OF DISCHARGE:  LH                                HISTORY & PHYSICAL   CHIEF COMPLAINT:  Chest pain and cough.   HISTORY OF PRESENT ILLNESS:  This is a 65 year old female who was discharged  approximately two and a half weeks ago after having suffered a documented  pulmonary embolism following total hip replacement (August 05, 2005). She  also has nonischemic cardiomyopathy with an ejection fraction of  approximately 20 to 25% (December 2005). She is status post implantation of  cardioverter defibrillator in December of 2005 as well. She has a chronic  left bundle branch block and stable coronary artery disease. There is a long-  standing history of hypertension as well as well controlled diabetes. She  has been noted to have anemia of chronic disease which has been stable.  Surgical history is significant for hysterectomy, several orthopedic  procedures and history of colonic polyps.   The patient was discharged as noted. She has done well. Her protimes have  been therapeutic on Coumadin 3 mg daily.   The patient developed the onset of minimally productive cough associated  with fever and left anterolateral chest pain. She also has had some chest  pain. She presented to the emergency department for evaluation.   In the emergency department, the patient was found to have stable hemogram  with a white count of 9,700. Left shift is present. Chemistries are normal  except for a glucose of 179. D-dimer is elevated at 1.68 and BNP is 247.  Lanoxin level 1.4. INR 3.2. A chest x-ray showed improved atelectasis or  consolidation in left lower lobe with minimal right base atelectasis and  cardiomegaly status  post pacemaker. A CT scan including an angiogram  demonstrated a large pulmonary embolism involving the left lower lobe and  pulmonary artery. There were no new areas of pulmonary embolism identified.  There was also a probable central infiltrate in the right middle lobe and  left upper lobe. There was somewhat nodular appearance, and a followup CT  was suggested to exclude a developing mass in the right middle lobe.   The patient is admitted with apparent pneumonia. No evidence of new  pulmonary emboli. Anticoagulation adequate at this point. CT as noted.   There is no history of headache, neurological deficits, chest pain  compatible with angina pectoris, syncope, palpitations, diaphoresis, nausea,  vomiting, diarrhea, melena, hematemesis, hematochezia or genitourinary  symptoms. She does have pain in her left knee and has recently been seen by  orthopedics and received an intraocular injection of steroids with some  improvement.   CURRENT MEDICATIONS:  1.  Lanoxin 0.25 mg daily.  2.  Celebrex 200 mg daily p.r.n.  3.  Tylox p.r.n.  4.  Robaxin 500 q.6h. as needed.  5.  Chlorthalidone 12.5 daily.  6.  Toprol-XL 100 daily.  7.  Metformin 500 mg b.i.d.  8.  Lotrel 5/20 one daily.  9.  Coumadin 3 mg daily.   ALLERGIES:  None known.   PAST HISTORY:  As noted above.   FAMILY HISTORY:  Noncontributory.   REVIEW OF SYSTEMS:  Negative except as mentioned.   SOCIAL HISTORY:  She is a nonsmoker, nondrinker and has no history of drug  abuse.   PHYSICAL EXAMINATION:  GENERAL:  There is a very pleasant female who  currently is alert and oriented in no acute distress.  VITAL SIGNS:  Temperature 100.8 orally, blood pressure 141/86, heart rate  112, respirations 20 and unlabored. O2 saturation 100%.  HEENT:  Normocephalic, atraumatic. Pupils were equal. There is no scleral  icterus. Ears, nose and throat are benign.  NECK:  Supple. No masses, thyromegaly, lymphadenopathy or bruits.   LUNGS:  Reveal isolated rales in the left lower lung zones. There are no  rhonchi or wheezes noted. The rest of the lung fields are clear.  ABDOMEN:  Nontender and nondistended.  EXTREMITIES:  No clubbing, cyanosis, or edema. The left knee is somewhat  crepitant with a scant effusion. It is stable in all planes.  NEUROLOGICAL:  Nonfocal.   ASSESSMENT:  Apparent pneumonia in a 65 year old female with complex history  as noted above. Currently, she is hemodynamically stable and oxygenating  well and appears comfortable. There is no evidence of recurrent pulmonary  embolism.   PLAN:  Admit for routine antibiotic therapy, pulmonary toilet and close  observation. We will follow and treat expectantly.      Patrica Duel, M.D.  Electronically Signed     MC/MEDQ  D:  10/17/2005  T:  10/17/2005  Job:  [id number]

## 2010-11-08 NOTE — Op Note (Signed)
NAME:  [name], [name]                           ACCOUNT NO.:  [id number]   MEDICAL RECORD NO.:  [id number]                   PATIENT TYPE:  INP   LOCATION:  A320                                 FACILITY:  [facility]   PHYSICIAN:  R. Roetta Sessions, M.D.              DATE OF BIRTH:  08-03-1945   DATE OF PROCEDURE:  10/15/2003  DATE OF DISCHARGE:                                 OPERATIVE REPORT   PROCEDURE:  Colonoscopy with gold probe and injection therapy bleeding  polypectomy site.   ENDOSCOPIST:  Gerrit Friends. Rourk, M.D.   INDICATIONS FOR PROCEDURE:  The patient is a 65 year old lady who underwent  a colonoscopy with snare polypectomy with removal of a sessile cecal polyp  in piecemeal fashion with a saline assisted polypectomy on Friday, October 14, 2003.  She also had an anal rectal lesion which was biopsied and another  rectal polyp which was removed with left-sided diverticula.  She did well  until yesterday afternoon when she was working and developed diaphoresis and  had multiple episodes of gross blood per rectum. She saw Dr. Mosetta Putt in the  emergency department.  She was found to have a hemoglobin of 10 and gross  blood on the rectal exam; she was 13 some 4 weeks ago.  We have brought her  into the hospital.  Hemoglobin this morning was 8.7.  She had multiple  bloody stools; however, she has remained hemodynamically stable.  She is in  the process of getting 1 of 2 units ordered.  Colonoscopy is now being done  with therapeutic intent in mind.  This approach has been discussed with the  patient and the patient's husband at length.  The potential risks, benefits,  and alternatives have been reviewed; questions answered.  Please see my  dictated H&P. It is notable the patient denies taking any form of aspirin or  nonsteroidal agent since the initial procedure.   PROCEDURE NOTE:  O2 saturation, blood pressure, pulse and respirations were  monitored throughout the entire procedure.  Conscious sedation: Versed 4 mg IV, Demerol 100 mg IV in divided doses.   INSTRUMENT:  Olympus adult video system.  Digital rectal exam revealed no  abnormalities.   ENDOSCOPIC FINDINGS:  There was blood tinged fluid in the rectum which  trailed up all the way to the cecum.  Again, an anal papilla-like lesion was  seen at the anal verge (biopsies pending).  The patient also had left-sided  diverticula.  In the cecum the prior polypectomy site was identified  adjacent to the appendix.  There was oozing at the base; 8 cc of 1:10,000  epinephrine was subsequently injected into the mucosa around the area of  oozing.  This slowed the oozing down; 2 applications of gold probe at 25  joules precipitated more brisk bleeding.  Subsequently 4 more cc of  epinephrine was injected into the base of the polypectomy  site.  This area  was washed and observed for approximately 10 minutes.  There appeared to be  good hemostasis achieved.   From this level the scope was slowly withdrawn.  All previously mentioned  mucosal surfaces were again seen; no other abnormalities were observed.  The  patient tolerated the procedure well and was reacted in endoscopy.   IMPRESSION:  1. Anorectal lesion as described above, biopsy pending, otherwise normal     rectum.  2. Sigmoid diverticula.  3. Bleeding polypectomy site in cecum identified and treated as described     above.   DISCUSSION:  I feel that this lady is at moderately high risk of rebleeding  from the polypectomy site.   RECOMMENDATIONS:  Clear liquid diet, complete her second unit of packed red  blood cells.  Transfusion will follow with a 1-hour posttransfusion H&H.  Will keep 2 units typed and screened.  I talked with Dr. Leretha Dykes who is going  to get me the path report from April 23.  If this lesion happens to have  invasive carcinoma, then she will need a right hemicolectomy.  If not, she  will need a follow up colonoscopy in 3 months. However, if  she rebleeds and  the biopsies are negative I would consider sending her down for mesenteric  angiogram with intervention.  I have discussed my findings with the patient  and the patient's husband.      ___________________________________________                                            Jonathon Bellows, M.D.   RMR/MEDQ  D:  10/15/2003  T:  10/16/2003  Job:  [id number]   cc:   R. Roetta Sessions, M.D.  [address]  [place]  [place] [address]  Fax: [phone]   Madelin Rear. Sherwood Gambler, M.D.  [address]  [place]  [place] [address]  Fax: [phone]

## 2010-11-08 NOTE — Assessment & Plan Note (Signed)
[facility]                               PULMONARY OFFICE NOTE   [name], [name]                        MRN:          [id number]  DATE:02/13/2006                            DOB:          27-Oct-1945    PULMONARY CONSULTATION:   HISTORY:  Exceptionally complicated 65 year old female, former smoker with  chronic dyspnea with exertion but not at rest or nocturnally, who comes in  with new right anterior chest discomfort in a setting of having developed  apparent pulmonary embolism following right hip surgery in February.  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   The patient developed abrupt right onset chest discomfort a week ago and was  seen in the emergency room with a question of a new pulmonary embolism on CT  scan that she brought with her from an outside clinic but was seen in the  emergency room by Dr. Effie Shy who reviewed all of the old and new scans which  were not available to me today and did not feel that there was any evidence  of a new pulmonary embolism and recommended continued Coumadin.  In the  meantime, the patient's chest pain has resolved.  She denies any fevers,  chills, sweats, orthopnea, PND, or leg swelling.   She states that the pain indeed was about the same spot as her previous  chest pain related to pulmonary embolism.   PAST MEDICAL HISTORY:  Significant for diabetes, hypertension and heart  failure.  She is status post remote colon surgery.   ALLERGIES:  None known.   MEDICATIONS:  Taken in detail on the work sheet, dated February 13, 2006.  Correct as listed.   PHYSICAL EXAMINATION:  GENERAL:  She is an anxious, ambulatory female in no  acute distress.  VITAL SIGNS:  Stable.  HEENT:  Unremarkable.  Pharynx clear.  Dentition intact.  NECK:  Supple without cervical adenopathy or tenderness.   Trachea is  midline.  No thyromegaly.  LUNGS:  Lung fields revealed clear bilaterally to auscultation and  percussion.  HEART:  There is regular rhythm with a 2/6 __________ murmur.  ABDOMEN:  Soft. benign.  EXTREMITIES:  Warm, without calf tenderness, cyanosis, clubbing or edema.   IMPRESSION:  1. Extremely complicated history given the fact that this patient has had      a diagnosis of pneumonia, septic emboli and thromboembolic emboli      related to hip surgery. The main issue in treating patients like this      is whether any new pattern of pleuritic pain develops while on therapy.      It is extremely unlikely that new pulmonary emboli are going to go to      the same place twice, although old emboli certainly can cause scarring      on x-ray and lung infarction and/or infarction/pneumonia syndromes.      The fact that she has no significant pleural effusion or new emboli on  her most recent scan is certainly encouraging in this regard.  2. In the future, however, I told the patient that if she would like to      return to see me for evaluation of recurrent chest pain syndrome,      pulmonary emboli, I would like to have all of the outside scans at the      time of the evaluation (apparently she left these in the [facility] Emergency      Room).  Since they have already been reviewed by the [facility] radiologists      and Dr. Effie Shy, I do not believe I have anything further to add to the      present evaluation but would certainly be happy to see her back should      new pain develop or unexplained dyspnea develop (she assures me she is      completely back to baseline now).  Therefore, pulmonary followup could      be as needed with the recommendations to continue Coumadin under Dr.      Nobie Putnam and cardiology's direction.                                   Charlaine Dalton. Sherene Sires, MD, [facility]   MBW/MedQ  DD:  02/15/2006  DT:  02/16/2006  Job #:  [id number]   cc:   Patrica Duel, MD

## 2010-11-08 NOTE — Letter (Signed)
March 18, 2006     Madelin Rear. Sherwood Gambler, MD  [address]  [place], [place] [address]   RE:  DAYAMI, TAITT  MRN:  [id number]  /  DOB:  10/25/1945   Dear [name]:   Ms. Virrueta returns to the office, now more than 4 months following near  fatal complications of a hip fracture including endocarditis and infection  of her AICD as well as pulmonary embolism.  She initially recovered in a  skilled care facility, but returned home in July.  She has experienced  increasing strength and weight gain since them, but continues to have  dyspnea with mild to moderate exertion, and mostly excessive fatigue.  She  has had some intermittent ankle edema.  She has no orthopnea nor PND, and no  chest discomfort.   Surveillance cultures were obtained in July, and were negative.  She was  anemic at that time and hypokalemic.   Blood pressure control has apparently been good.  She has improved function  of her hip with physical therapy.  She notes occasional streaking of her  stool with blood, which she attributes to hemorrhoids.   PHYSICAL EXAMINATION:  Well-appearing woman in no acute distress.  The weight is 155, a 10-pound increase since 3 months ago.  Blood pressure  125/70, heart rate 90 and regular, respirations 16.  NECK:  No jugular venous distention; normal carotid upstrokes without  bruits.  LUNGS:  Clear.  CARDIAC:  Normal first and second heart sounds; third heart sound present;  early systolic ejection murmur across the precordium.  ABDOMEN:  Soft and nontender; no organomegaly; no masses.  EXTREMITIES:  Trace edema; 1+ distal pulses.   IMPRESSION:  Ms. Hight is recovering from multiple medical problems related  to hospitalization more than 4 months ago.  Her medical regimen appears  appropriate.  I am inclined to treat her for 6 months with anticoagulants  before discontinuation.  We will check basic laboratory studies plus stool  for Hemoccult testing.  She had a surveillance  colonoscopy 2 years ago.  She  may require repeat attention from her gastroenterologist.  She is reminded  to receive influenza vaccine in your office when available.  I will reassess  this nice woman again in December.  We will continue to manage warfarin  therapy in anticoagulation clinic.    Sincerely,      Gerrit Friends. Dietrich Pates, MD, [facility]     RMR/MedQ  DD:  03/18/2006  DT:  03/20/2006  Job #:  [id number]

## 2010-11-08 NOTE — Discharge Summary (Signed)
[name], COMUNALE                 ACCOUNT NO.:  [id number]   MEDICAL RECORD NO.:  [id number]          PATIENT TYPE:  INP   LOCATION:  A227                          FACILITY:  [facility]   PHYSICIAN:  Madelin Rear. Sherwood Gambler, MD  DATE OF BIRTH:  February 24, 1946   DATE OF ADMISSION:  09/03/2006  DATE OF DISCHARGE:  03/16/2008LH                               DISCHARGE SUMMARY   DISCHARGE DIAGNOSES:  1. Hyperosmolar state secondary to severe hyperglycemia.  2. Hypertension.  3. Coronary disease.  4. Previous anemia status post pulmonary embolus.  5. Status post pneumonia.   DISCHARGE MEDICATIONS:  1. Lanoxin 0.25 mg p.o. daily.  2. Enalapril 20 mg p.o. b.i.d.  3. Carvedilol 25 mg p.o. b.i.d.  4. Spironolactone 25 mg p.o. daily.  5. Metformin 1000 mg p.o. daily.  6. Lasix 40 mg p.o. b.i.d.  7. Vicodin 5/500 one q.4 hours p.r.n. pain.  8. Actos 45 mg p.o. daily.  9. Lantus 25 units SQ q.h.s.   SUMMARY:  The patient was admitted with hyperosmolar non ketotic  hyperglycemic state. She had poor compliance with monitoring blood  sugars as well as diet. She as hydrated and placed on an insulin drip  with marked improvement in her blood sugars. She is still running in the  200 range on 25 units of subcutaneous Lantus daily and Actos 30 mg daily  was added to her regimen.   She was discharged on continued Lantus with prescriptions written and  increased dose of Actos to 45 mg daily. She will be followed closely as  an outpatient to prevent further skyrocketing of blood sugars.      Madelin Rear. Sherwood Gambler, MD  Electronically Signed     LJF/MEDQ  D:  09/06/2006  T:  09/07/2006  Job:  [id number]

## 2010-11-08 NOTE — Consult Note (Signed)
NAMEBRISHA, [name] NO.:  [id number]   MEDICAL RECORD NO.:  [id number]          PATIENT TYPE:  INP   LOCATION:  A227                          FACILITY:  [facility]   PHYSICIAN:  Gerrit Friends. Dietrich Pates, MD, FACCDATE OF BIRTH:  December 31, 1945   DATE OF CONSULTATION:  04/10/2006  DATE OF DISCHARGE:                                   CONSULTATION   REFERRING PHYSICIAN:  Dr. Sherwood Gambler.   PRIMARY CARDIOLOGIST:  Dr. Dietrich Pates.   HISTORY OF PRESENT ILLNESS:  A 65 year old woman with known nonischemic  cardiomyopathy admitted to hospital with progressive congestive heart  failure.  Ms. Battie noted increasing dyspnea on exertion progressing to  dyspnea at rest over the week preceding admission.  She has had no chest  discomfort.  There have been no recent changes in medication.  She reports  no dietary indiscretion.  She has noted a gain of a few pounds in weight,  but attributed this to better nutrition.  There has been no problem with  blood pressure control.   PAST MEDICAL HISTORY:  Notable for AICD placement in December 2005.  This  device was removed December 2007 due to infection.  The patient has had  hypertension, hyperlipidemia and a chronic left bundle branch block.  She  had very modest use of tobacco products, which was discontinued a decade  ago.   She had a complex hospitalization in January 2007 stretching for a number of  months.  This was initiated by elective hip replacement, but she  subsequently suffered a pulmonary embolism and then developed enterococcal  sepsis requiring removal of her AICD.  She gradually improved, was  discharged to a nursing home and then subsequently to her own home.  She has  gradually increased her level of activity.  She has recovered from her hip  replacement.  She was deconditioned and malnourished at the time of hospital  discharge, but these have improved.   Recent medications included warfarin as directed, KCl 20 mEq daily,  digoxin  0.25 mg daily, metformin 500 mg b.i.d., chlorthalidone 12.5 mg daily and  metoprolol 50 mg b.i.d.   Past medical history is notable for diabetes that has been easily controlled  with an oral agent.  She has continuing osteoarthritis, most notably on the  left knee.   Social history, family history and review of systems were updated.  There  were no notable additions.   On exam, pleasant pale woman appearing chronically ill and in minimal  respiratory distress.  The blood pressure is 140/70, heart rate 105 and  somewhat irregular, respirations 22.  Afebrile.  NECK:  No jugular venous distension; normal carotid upstrokes without  bruits.  LUNGS:  No rales one-quarter of the way up bilaterally.  CARDIAC:  Normal first heart sounds; increased intensity of the second heart  sound with prominent splitting; PMI not palpable.  ABDOMEN:  Soft and nontender; no masses; normal bowel sounds; liver edge 1  cm below the right costal margin.  EXTREMITIES:  Trace edema; distal pulses intact.   EKG:  Normal sinus rhythm; left bundle branch  block; unchanged from prior  tracings.   Chest x-ray:  Small bilateral effusions and mild pulmonary edema.   Other laboratory notable for initial pO2 of 53 on room air, mild anemia with  hematocrit of 31, initial potassium of 2.9, total bilirubin of 1.9, INR of  3.1 and a BNP level of 1270.   IMPRESSION:  [name] presents with recurrent congestive heart failure.  In the past, congestive heart failure has been mild and easily managed.  There is no apparent precipitant for her decompensation, no recent change in  her medication.  We will obtain an echocardiogram to reassess left  ventricular and valvular function.  She will complete serial cardiac markers  to rule out myocardial infarction, which is unlikely in the face of  previously normal coronary arteries.  Medications will be adjusted to  adequately manage her congestive heart failure.  We are  reluctant to place  another implantable cardioverter defibrillator or biventricular pacemaker  due to her near fatal infected device in the recent past.   Plans were for 6 months of anticoagulation.  We will continue warfarin for  now.   We greatly appreciate the request for consultation and will be happy to  assist with Ms. Salak's care.      Gerrit Friends. Dietrich Pates, MD, [facility]  Electronically Signed     RMR/MEDQ  D:  04/10/2006  T:  04/11/2006  Job:  [id number]

## 2010-11-08 NOTE — H&P (Signed)
[name], [name]                 ACCOUNT NO.:  [id number]   MEDICAL RECORD NO.:  [id number]          PATIENT TYPE:  INP   LOCATION:  A227                          FACILITY:  [facility]   PHYSICIAN:  Madelin Rear. Sherwood Gambler, MD  DATE OF BIRTH:  1945/11/21   DATE OF ADMISSION:  09/03/2006  DATE OF DISCHARGE:  LH                              HISTORY & PHYSICAL   CHIEF COMPLAINT:  Polyuria, polydipsia.   HISTORY OF PRESENT ILLNESS:  The patient had progressively increasing  polyuria and polydipsia over at least a 1-week period of time.  She has  had some blurred vision associated with this.  She has had no fever,  rigors, chills or other constitutional symptoms.  Subsequently I was  called and informed that her blood sugar was very high at home in the  697 range on some labs drawn as an outpatient.  We called and advised  that she present to the emergency department for confirmation of that  blood sugar as well as treatment.  She, in fact, was seen by the  emergency room physician and had a blood sugar greater than 740.   PAST MEDICAL HISTORY:  1. Noninsulin-dependent diabetes mellitus.  2. Coronary artery disease.  3. Hypertension.  4. Previous anemia.  5. Pulmonary embolism.  6. Pneumonia.   SURGICAL HISTORY:  1. Hip replacement.  2. Hysterectomy.  3. Implanted defibrillator but subsequently removed.   SOCIAL HISTORY:  Nonsmoker, nondrinker.  No drug use.  Married.  Lives  with husband.   FAMILY HISTORY:  Noncontributory.   REVIEW OF SYSTEMS:  As under the HPI, all else negative.   PHYSICAL EXAMINATION:  SKIN:  Unremarkable.  HEAD AND NECK:  No JVD or adenopathy.  NECK:  Supple.  CHEST:  Clear.  CARDIAC:  Regular rhythm without murmur, gallop, or rub.  ABDOMEN:  Soft, no organomegaly or masses.  EXTREMITIES:  Without clubbing, cyanosis, or edema.  NEUROLOGIC:  Nonfocal.   LABORATORY DATA:  Hemoglobin 10.9 g%, normal white count, normal  platelet count, no left shift.   Electrolytes reveal pseudohyponatremia  at 124 with a blood sugar documented at 747.  Her electrolytes were  otherwise remarkable for a BUN of 39 and creatinine 1.23.  Marked  glucosuria is noted, otherwise urine was negative for any active  sediment.  Digoxin level was obtained and was slightly subtherapeutic at  0.6.   IMPRESSION:  Diabetes out of control with hyperosmolar, nonketotic  state.  She will be admitted for IV insulin dosing with anticipated  improvement rate of approximately 100 mg% per hour to avoid central  pontine myelinolysis.  She was advised regarding this.  IV hydration  will continue as well.  She obviously needs some strong education on  dietary indiscretion as well as medication usage.  This will be  instituted in-house.      Madelin Rear. Sherwood Gambler, MD  Electronically Signed     LJF/MEDQ  D:  09/04/2006  T:  09/04/2006  Job:  [id number]

## 2010-11-08 NOTE — Consult Note (Signed)
NAME:  [name], [name]                           ACCOUNT NO.:  [id number]   MEDICAL RECORD NO.:  [id number]                  PATIENT TYPE:   LOCATION:                                       FACILITY:   PHYSICIAN:  R. Roetta Sessions, M.D.              DATE OF BIRTH:  [birth year]   DATE OF CONSULTATION:  DATE OF DISCHARGE:                                   CONSULTATION   CHIEF COMPLAINT:  Rectal bleeding for one month, stomach pain and burning  for one month, and also urinary frequency.   HISTORY OF PRESENT ILLNESS:  The patient states that approximately one month  she began passing bright red blood per rectum at least three times per week.  She states that there was blood on the toilet paper, on the stool, and  enough blood to stain the toilet water red. The patient went to [facility] and was seen by Dr. Mosetta Putt. She reported positive hemoccult at that  time. The patient also reported burning and pain in the stomach. The patient  states she was given a Prevpac which she took completely. The patient  further states she was given hemoccult cards times three which were reported  to be negative and x-ray was done of the abdomen and findings showed  cardiomegaly, elongation of aorta, normal vascularity, and lungs to be  clear. There were nonspecific bowel gas patterns without signs of  obstruction wall thickening or perforation. The patient states that her  stools range in color from brown to black and tarry. The patient reports she  is still having bright red blood per rectum and the patient reports an  improvement of the stomach pain and burning, but still has a slight amount  of pain. The patient also reports increased urinary frequency.   PAST MEDICAL HISTORY:  The patient states she has a weak heart, but was  not able to name the particular disease process. Records will be requested  from her family physician Dr. Elfredia Nevins. The patient reports  hypertension and fluid  retention.   PAST SURGICAL HISTORY:  1. Left knee arthroscopy approximately eight years ago in either 1994 or     1995.  2. Hysterectomy in the 1980s.   CURRENT MEDICATIONS:  1. Tylenol PM q.h.s.  2. K-Dur 10 mg one daily.  3. Toprol 100 mg one daily.  4. Monopril 20 mg one daily.  5. Lanoxin 0.25 mg one daily.  6. Tenoretic 25 mg one-half daily.   ALLERGIES:  No known drug allergies.   FAMILY HISTORY:  The patient states her mother is in her 4s and living. Her  only known disease is diabetes mellitus. The patient's father died of an MI  at the of 49. The patient has a sister living, 30 years of age, with cardiac  disease. The patient has one brother living, 60 years of age, and in good  health  and another brother who died at the age of 59 of an unknown type of  cancer.   SOCIAL HISTORY:  The patient is married for 41 years and has six children.  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  The patient reports she smokes one pack of cigarettes which  last her approximately one month. The patient denies any alcohol or yeast.   REVIEW OF SYSTEMS:  The patient reports that her weight is stable. Denies  fevers or chills. Does report waking with sweats int eh early morning.  CARDIOVASCULAR: Weak heart requiring records for verification. PULMONARY:  The patient denies shortness of breath, hemoptysis, cough.  GI: See history  of present illness.   OBJECTIVE:  VITAL SIGNS: Weight 178.25, height 5 feet 5 inches, temperature  97.7, blood pressure 140/70, pulse 84.  GENERAL: The patient is a well-appearing 65 year old Caucasian female  comfortably sitting in a chair in the examination room. She is cooperative,  pleasant.  HEENT:  The conjunctiva are pink. The sclerae are clear and nonicteric. The  oropharynx is pink and moist without lesions.  NECK: Supple with no masses or lymphadenopathy. No thyromegaly palpated.  CHEST: The heart rate is regular  rate and rhythm with no murmurs, rubs, or  gallops.  LUNGS: Clear to auscultation bilaterally.  ABDOMEN: A central surgical scar at the site of hysterectomy. The bowel  sounds are present in all four quadrants. There is no tenderness to  palpation of the abdomen.  RECTAL EXAM: Hemoccult negative.   ASSESSMENT:  1. Rectal bleeding which has been persistent over the past month. The     patient had no previous history of hemorrhoids or rectal bleeding.  2. Possible  peptic ulcer disease. The patient was treated with a Prevpac by     the emergency room physician, but the patient still reports some stomach     pain and burning.  3. Urinary frequency.  4. It is noted that the patient has a 2 cm ulcerated lesion in the central     upper forehead with appearance of a basal cell carcinoma. This will be     noted with the patient's primary care physician for further assessment     and referral.   PLAN:  This patient will be scheduled for an EGD and colonoscopy. Procedures  and risks for the procedure were thoroughly explained to the patient.  Laboratory studies were ordered to include a CBC and urinalysis index to  culture as needed. I would like to thank Dr. Sherwood Gambler for this referral.     ________________________________________  ___________________________________________  Ashok Pall, PA-C                         Jonathon Bellows, M.D.   GC/MEDQ  D:  10/04/2003  T:  10/04/2003  Job:  [id number]   cc:   Madelin Rear. Sherwood Gambler, M.D.  [address]  [place]  [place] [address]  Fax: [phone]   Dr. Colvin Caroli [facility]

## 2010-11-08 NOTE — Discharge Summary (Signed)
[name], [name] NO.:  [id number]   MEDICAL RECORD NO.:  [id number]          PATIENT TYPE:  INP   LOCATION:  2019                         FACILITY:  [facility]   PHYSICIAN:  Patrica Duel, M.D.    DATE OF BIRTH:  [birth year]   DATE OF ADMISSION:  10/17/2005  DATE OF DISCHARGE:  05/04/2007LH                                 DISCHARGE SUMMARY   DISCHARGE DIAGNOSES:  1.  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  2.  Nonischemic cardiomyopathy with ejection fraction of approximately 20-      25%.  3.  Status post implantation of ICB December 2005.  4.  Documented pulmonary embolism following total hip replacement (August 05, 2005) with adequate/chronic Coumadinization.  5.  Hypertension.  6.  Well-controlled diabetes.  7.  Anemia of chronic disease.  8.  History of hysterectomy.  9.  History of several orthopedic procedures.  10. History of colonic polyps.   HISTORY AND PHYSICAL:  For details regarding admission, please refer to the  Admitting.  Briefly, this 65 year old female with the above history  presented to the emergency department with a cough associated with fever and  left anterolateral chest pain.  CT scan including angiogram demonstrated  large pulmonary embolism involving the left lower lobe and pulmonary artery.  There were no new areas of pulmonary embolism identified.  There was  essential infiltrate in the right middle lobe and left upper lobe.  This was  compatible with pneumonia, though, it had somewhat nodular appearance and  follow up is needed.   The patient was admitted with apparent pneumonia.  No evidence of new  pulmonary emboli.   The patient also complained of pain in her left knee which had been a  chronic problem, and she had recently been seen by orthopedics where she had  intraarticular injections of steroids with some improvement.   HOSPITAL COURSE:  The patient was treated for community acquired pneumonia.  Blood cultures were obtained.  They were 2 of 2 positive for enterococcus.  The enterococcus organism is rarely associated with pneumonia, and  therefore, the Cardiology was consulted as she was at great risk for  endocarditis given her history.  Transesophageal echo was obtained which  revealed a mass on one of the ICD wires.  An EGD was also performed just  prior to this procedure which revealed noncritical Schatzki's Ring.  Dr.  Tiburcio Pea was consulted during the hospitalization and several attempts at  aspiration of her knee were nonproductive except for demonstrating  hemarthrosis.   The patient was started on vancomycin and gentamycin as soon as blood  cultures were obtained.   The patient remained relatively stable and was transferred to [facility]  for ongoing management given her multiple comorbidities and need for removal  of the ICD wire and further Cardiology evaluation and intervention.   DISPOSITION:  As per [facility] physicians.   FOLLOWUP:  To be followed and treated upon her return.      Patrica Duel,  M.D.  Electronically Signed     MC/MEDQ  D:  12/21/2005  T:  12/22/2005  Job:  [id number]

## 2010-11-08 NOTE — Op Note (Signed)
NAME:  [name], [name]                           ACCOUNT NO.:  [id number]   MEDICAL RECORD NO.:  [id number]                   PATIENT TYPE:  AMB   LOCATION:  DAY                                  FACILITY:  [facility]   PHYSICIAN:  R. Roetta Sessions, M.D.              DATE OF BIRTH:  [birth year]   DATE OF PROCEDURE:  DATE OF DISCHARGE:                                 OPERATIVE REPORT   PROCEDURE:  Esophagogastroduodenoscopy and colonoscopy with snare  polypectomy, biopsy.   ENDOSCOPIST:  Gerrit Friends. Rourk, M.D.   INDICATIONS FOR PROCEDURE:  The patient is a 65 year old Caucasian female  with intermittent rectal bleeding and epigastric burning and some reflux  symptoms.  She was given a course of Prevpac which she took previously,  poorly, by Dr. Mosetta Putt.  No dysphagia.  She is now on aspirin suppression  therapy at this time. EGD and colonoscopy are not being done to further  evaluate her symptoms.  This approach has been discussed with the patient at  length.  The potential risks, benefits, and alternatives have been reviewed;  questions answered.  Please see the dictated H&P, October 04, 2003 for more  information.   PROCEDURE NOTE:  O2 saturation, blood pressure, pulse and respirations were  monitored throughout the entire procedure.  Conscious sedation: Versed 4 mg  IV, Demerol 100 mg IV in divided doses, Cetacaine spray for topical  oropharyngeal anesthesia.   INSTRUMENT:  Olympus GIF-140 gastroscope; pediatric colonoscope.   FINDINGS EGD:  Examination of the tubular esophagus revealed no mucosal  abnormalities. The EG junction was easily traversed.   STOMACH:  The gastric cavity was empty.  It insufflated well with air.  A  thorough examination of the gastric mucosa including a retroflex view of the  proximal stomach and esophagogastric junction demonstrated no abnormalities.  The pylorus was patent and easily traversed.  Examination of the bulb  revealed four 3-mm ulcers, otherwise the  bulb and second portion appeared  normal.   THERAPEUTIC/DIAGNOSTIC MANEUVERS:  None.   The patient tolerated the procedure well and was prepared for colonoscopy.  A digital rectal exam revealed no appreciable abnormalities.   ENDOSCOPIC FINDINGS:  The prep was good.   RECTUM:  Examination of the rectal mucosa including the retroflex view of  the anal verge revealed a 3-mm rectal polyp at 10 cm and a large 1.5 cm  pedunculated lesion at the anal verge.  She had a couple of small anal  papilla.  This was inspected, best retroflexed, it almost had an adenomatous  appearing appearance.  It was not appreciated on rectal exam.  It was  pedunculated on a near stalk; please see photos.   COLON:  The colonic mucosa was surveyed from the rectosigmoid junction  through the left transverse and right colon to the area of the appendiceal  orifice, ileocecal valve, and cecum.  These structures were well seen and  photographed for the record.   From the level of the cecum and ileocecal valve the scope was slowly  withdrawn.  All previously mentioned mucosal surfaces were again seen.  The  patient had a 2-cm, donut-shaped, sessile polyp in the base of the cecum  adjacent to the appendiceal orifice; please see photos.  The patient also  had a left-sided diverticula.  The remainder of the colonic mucosa appeared  normal.   Attention was focused on the lesion in the cecum.  It was lifted away from  the cecal wall with 3-cc of saline injected submucosally.  It is notable  that this lesion lifted away from the wall nicely with this maneuver and  subsequently, using snare cautery, this was resected in piecemeal fashion.  It was a difficult polyp to resect given its location.  The tip of the snare  had an affinity for the appendiceal orifice.  Great care was taken  throughout the polypectomy.  Most of this lesion was removed; however, there  was felt to be some residual polyp tissue on the proximal side  of the polyp  closest to the appendix which was difficult to get; please see photos.  The  lesion at the anal verge was cold biopsied.  It was somewhat firm, somewhat  friable.  The patient tolerated the rather prolonged procedures well and was  reacted in endoscopy.   IMPRESSION:  1. Pedunculated lesion inside the anal verge, not appearing typically as a     large anal papilla, not clearly an adenoma. This lesion was cold     biopsied, but attempted complete snare polypectomy was not made.  2. A 3-mm rectal polyp at 10 cm, cold snared.  The remainder of the rectal     mucosa appeared normal.  3. Left-sided diverticula.  4. Donut-shaped polyp at the appendix, as described above; partially     resected in piecemeal fashion, as described above.   RECOMMENDATIONS:  1. No aspirin or arthritis medications for the next 10 days.  2. Follow up on path.  3. Diverticulosis literature provided to the patient.  4. This patient will need an early follow up colonoscopy, i.e. no later than     3 months from now to check the site at the cecum (she likely has some     residual polyp tissue present) and to relook at the rectal lesion     depending on the path report.  5. If the lesion in the colon were to contain invasive carcinoma she would     need a right colectomy and the lesion at the anal verge may need to be     better define either way in the near future, but we will review the path     report, when available, and go from there.      ___________________________________________                                            Jonathon Bellows, M.D.   RMR/MEDQ  D:  10/13/2003  T:  10/14/2003  Job:  [id number]   cc:   Madelin Rear. Sherwood Gambler, M.D.  [address]  [place]  [place] [address]  Fax: [phone]   R. Roetta Sessions, M.D.  [address]  [place]  [place] [address]  Fax: [phone]

## 2010-11-08 NOTE — H&P (Signed)
NAME:  [name], [name] NO.:  [id number]   MEDICAL RECORD NO.:  [id number]          PATIENT TYPE:  ORB   LOCATION:  NA                           FACILITY:  [facility]   PHYSICIAN:  Ellwood Dense, M.D.   DATE OF BIRTH:  08-03-1945   DATE OF ADMISSION:  08/05/2005  DATE OF DISCHARGE:  03/25/2005                                HISTORY & PHYSICAL   ORTHOPEDIST:  Dr. Salvatore Marvel   PRIMARY CARE PHYSICIAN:  Dr. Sherwood Gambler   HISTORY OF THE PRESENT ILLNESS:  Mrs. Howatt is a 65 year old adult female  with history of chronic right hip pain for at least 1 year's duration. She  had failed conservative treatment. She underwent right total hip replacement  Tuesday, August 05, 2005, by Dr. Thurston Hole. Postoperatively, she was allowed  touchdown weightbearing on the right lower extremity and she was on Lovenox  and Coumadin for DVT prophylaxis.   The patient was evaluated by the rehabilitation physicians and felt to be an  appropriate candidate for SACU level therapies.   REVIEW OF SYSTEMS:  Positive for dentures upper and lowers, easy bruising,  history of kidney infections, dizziness, headaches, frequent urination and  nocturia.   PAST MEDICAL HISTORY:  1.  Hypertension.  2.  Prior borderline diabetes mellitus with diet control.  3.  Nonischemic cardiomyopathy.  4.  Defibrillator placement.   ALLERGIES:  No known drug allergies.   SOCIAL HISTORY:  The patient lives with her husband in a one-level home with  seven steps to enter. The patient does not use alcohol or tobacco. The  patient's husband is in good health.   MEDICATIONS PRIOR TO ADMISSION:  1.  Chlorthalidone 12.5 mg daily.  2.  Digoxin 0.25 mg daily.  3.  Lotrel 5/20 one tablet daily.  4.  Toprol-XL 100 mg daily.   FUNCTIONAL STATUS PRIOR TO ADMISSION:  Independent with or without a single  point cane.   PHYSICAL EXAMINATION:  GENERAL:  Well-appearing, fit adult female seated up  in a chair in no acute  discomfort.  HEENT:  Normocephalic, nontraumatic.  CARDIOVASCULAR:  Regular rate and rhythm, S1, S2, without murmurs.  ABDOMEN:  Soft, nontender, with positive bowel sounds.  LUNGS:  Clear to auscultation bilaterally without rhonchi, wheezes, or  rales.  NEUROLOGIC:  Alert and oriented x3. Cranial nerves II-XII are intact.  Bilateral upper extremity exam showed 5-/5 strength throughout. Bulk and  tone were normal and reflexes were 2+ and symmetrical. Right lower extremity  strength showed approximately 4-/5. She has a dry dressing with staples in  place on the right hip. Sensation was intact to light touch throughout the  right lower extremity. Left lower extremity exam shows 4+/5 strength  throughout. Bulk and tone were normal and reflexes were 2+ and symmetrical.  Sensation was intact to light touch throughout the left lower extremity.   IMPRESSION:  Status post right total hip replacement, postoperative day #3.   The patient presently has deficits in ADLs, transfers and ambulation  secondary to the right total hip replacement.   PLAN:  1.  Admit to  subacute for daily OT and PT therapies to advance ADLs,      transfers, and ambulation.  2.  Twenty-four hour nursing for medication administration along with      monitoring of vital signs and wound dressing changes as needed.  3.  Continue subcu Lovenox 30 mg q.12h. until INR is consistently greater      than 2.0 on Coumadin, managed by pharmacy.  4.  Check admission labs including CBC and CMET Monday, August 11, 2005.  5.  Continue monitoring hypertension on Lanoxin 0.25 mg daily with Toprol-      XL, Lotensin, and Norvasc all on hold at present.  6.  Continue high-carbohydrate-modified diet.  7.  P.r.n. Percocet q.4h. for pain.   ESTIMATED LENGTH OF STAY:  5-10 days   PROGNOSIS:  Good.   GOALS:  Modified independence ADLS, transfers, and ambulation.           ______________________________  Ellwood Dense, M.D.      DC/MEDQ  D:  08/08/2005  T:  08/08/2005  Job:  [id number]

## 2010-11-08 NOTE — Discharge Summary (Signed)
[name], NAPIERKOWSKI NO.:  [id number]   MEDICAL RECORD NO.:  [id number]          PATIENT TYPE:  INP   LOCATION:  A227                          FACILITY:  [facility]   PHYSICIAN:  Patrica Duel, M.D.    DATE OF BIRTH:  1945-12-14   DATE OF ADMISSION:  04/09/2006  DATE OF DISCHARGE:  10/22/2007LH                                 DISCHARGE SUMMARY   DISCHARGE DIAGNOSIS:  1. Pulmonary edema secondary to nonischemic cardiomyopathy (ejection      fraction approximately 20 25%).  2. 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  3. Diabetes mellitus.  4. History of pulmonary embolus following total hip replacement in      February 2007.  5. Anemia of chronic disease.  6. Status post hysterectomy and several orthopedic procedures.  7. History of colonic polyps without evidence of any recurrence.   For details regarding History of Present Illness, please refer to History  and Physical.  Briefly, the patient is a 65 year old female with the above  history who presented to the emergency department with a 1-week history of  increasingly severe dyspnea and orthopnea without associated chest pain,  syncope, palpitations, fever, chills, cough, hemoptysis or edema.  She was  found to have frank pulmonary edema on chest x-ray as well as a BNP of 1200.  Other parameters were stable.  INR was 1.5.  EKG showed chronic left bundle  branch block.  Cardiac markers negative.  The patient was admitted with  congestive heart failure.   COURSE IN HOSPITAL:  The patient was immediately diuresed with IV Lasix with  good response.  She was seen in consultation by cardiology who added  spironolactone.  She had significant hypokalemia after the first ACE  treatment, and this has responded well to supplementation as well as the  addition of spironolactone.   Currently, the patient is stable for discharge.  Chest  x-ray obtained on  October 21 showed resolution of pulmonary edema.  Vital Signs are stable.  Blood sugar is well controlled.  She is eating well and requesting  discharge.   DISPOSITION:  Medications include  1. Lasix 40 mg p.o. b.i.d.  2. Spironolactone 25 mg daily.  3. KCL 20 mEq daily.  4. Vasotec 5 mg daily.  5. Glucophage 500 b.i.d.  6. Lopressor 50 b.i.d.  7. Lanoxin 0.25 daily.   She will be followed by myself as well as cardiology in the future.      Patrica Duel, M.D.  Electronically Signed     MC/MEDQ  D:  04/13/2006  T:  04/13/2006  Job:  [id number]

## 2010-11-08 NOTE — Op Note (Signed)
NAMEFAYOLA, [name]                 ACCOUNT NO.:  [id number]   MEDICAL RECORD NO.:  [id number]          PATIENT TYPE:  INP   LOCATION:  A222                          FACILITY:  [facility]   PHYSICIAN:  Lionel December, M.D.    DATE OF BIRTH:  27-Oct-1945   DATE OF PROCEDURE:  10/23/2005  DATE OF DISCHARGE:                                 OPERATIVE REPORT   PROCEDURE:  Esophagogastroduodenoscopy.   ENDOSCOPIST:  Lionel December, M.D.   INDICATIONS:  [name] is a 65 year old Caucasian female with multiple medical  problems, who also has dysphagia.  She was evaluated with a barium pill  study yesterday.  She has impaired esophageal peristalsis/presbyesophagus  and noncritical narrowing at the GE junction.  However, the patient is  anticoagulated for pulmonary embolism.  She also is being treated for  pneumonia and had positive blood cultures for Enterococcus.  She has an AICD  device in place.  She is undergoing TEE by Dr.  Dorethea Clan to make sure she does not have a vegetation.  Because of abnormal  barium study, he requested the esophagus be examine before he passes  endoscope for TEE.  The patient's INR is 2.7.  She is well aware that we  cannot dilate her esophagus today.  Procedure risks were reviewed with the  patient and informed consent was obtained.   MEDICATIONS FOR CONSCIOUS SEDATION:  Benzocaine spray for oropharyngeal  topical anesthesia, fentanyl 50 mcg IV, Versed 4 mg IV.   FINDINGS:  Procedure performed in endoscopy suite.  The patient's vital  signs and O2 SATs were monitored during the procedure and remained stable.  The patient was placed in the left lateral decubitus position and Olympus  videoscope was passed via oropharynx without any difficulty into the  esophagus.   ESOPHAGUS:  Mucosa of the esophagus was normal.  GE junction was at 42 cm  from the incisors and no noncritical ring or narrowing noted.  Scope was  easily passed across it.  There was a small sliding hiatal  hernia, no more  than 2-3 cm length.   STOMACH:  It was empty and distended very well with insufflation.  Folds of  the proximal stomach were normal.  Examination of the mucosa at body,  antrum, pyloric channel, as well as angularis, fundus and cardia was normal.   DUODENUM:  Bulbar mucosa was normal.  Scope was passed into the second part  of duodenum, where mucosal folds were normal.  Endoscope was withdrawn.  The  patient tolerated the procedure well.   FINAL DIAGNOSIS:  Small sliding hiatal hernia with noncritical narrowing or  ring at gastroesophageal junction.   No contraindication to transesophageal echocardiography.   RECOMMENDATIONS:  She should continue PPI chronically.  If dysphagia  remained a significant symptom, her esophagus will be dilated at a later  date when she is off anticoagulant or may have to be bridged with Lovenox.      Lionel December, M.D.  Electronically Signed     NR/MEDQ  D:  10/23/2005  T:  10/24/2005  Job:  [id number]   cc:  Patrica Duel, M.D.  Fax: [phone]   Vida Roller, M.D.  Fax: [phone]

## 2010-11-08 NOTE — Consult Note (Signed)
[name], [name]                 ACCOUNT NO.:  [id number]   MEDICAL RECORD NO.:  [id number]          PATIENT TYPE:  INP   LOCATION:  A204                          FACILITY:  [facility]   PHYSICIAN:  Lionel December, M.D.    DATE OF BIRTH:  08-15-1945   DATE OF CONSULTATION:  09/29/2005  DATE OF DISCHARGE:                                   CONSULTATION   REASON FOR CONSULTATION:  Anemia.   REQUESTING PHYSICIAN:  Patrica Duel, M.D.   HISTORY OF PRESENT ILLNESS:  Patient is a 65 year old Caucasian female with  multiple medical problems as outlined below.  He was admitted to Dr. Loraine Leriche  [facility]'s service, 3 days ago due to pulmonary embolus.  She presented with  pleuritic chest pain and shortness of breath.  On CT angiogram she was  confirmed to have a left lower lobe pulmonary embolus, and a possible small  right lower lobe embolus. She is now on Lovenox and Coumadin.  We have been  consulted because of anemia.  It is notable that this patient had a normal  hemoglobin of 13.2 on July 31, 2005.  This is during a hospitalization  for right hip replacement.  Postoperatively her hemoglobin had dropped on  August 11, 2005 her hemoglobin is 9.8, hematocrit 27.7.  She did not  require any blood transfusions during this hospitalization.  She was on  Coumadin for about month postoperatively.  She discontinued Coumadin around  September 02, 2005.  Upon admission her hemoglobin was 9.5, hematocrit 28.6.  Today her hemoglobin is 9.1, hematocrit 26.8, MCV 86.3.  Iron 14, TIBC 140,  iron saturation is 10%, ferritin 775, B12 606, folate 7.2.  She denies any  melena or rectal bleeding. She is more prone towards constipation, but  lately her bowel movements have been fine.  She denied any abdominal pain,  nausea, vomiting, or heart burn.  See below for prior colonoscopy and EGD.   MEDICATIONS AT HOME:  1.  Lanoxin 0.25 mg daily.  2.  Celebrex 100 mg p.r.n.  3.  Endocet 10/325 mg q.4 h. as needed.  4.   Robaxin 500 mg p.r.n.  5.  Chlorthalidone 25 mg daily.  6.  Toprol XL 100 mg daily.  7.  Metformin 500 mg b.i.d.  8.  Norvasc 5 mg daily.  9.  Lotensin 20 mg daily.  10. Potassium chloride 20 mEq daily.   ALLERGIES:  No known drug allergies.   PAST MEDICAL HISTORY:  History of CAD which has been stable.  She also has  dilated nonischemic cardiomyopathy with last ejection fraction around 20-25%  by echocardiography in October 2005.  She also has a mild MR and TR.  In  December 2005 she underwent biventricular ICD placement.  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  This was discovered on October 13, 2003 on  colonoscopy.  She had a large donut-shaped polyp at the appendix which was  only partially removed by piece meal fashion.  She had left-sided  diverticula.  A 3  mm rectal polyp which was cold snared and a pedunculated  lesion at the anal verge by biopsy turned out to be inflammatory polyp.  She  developed a post polypectomy bleed and required hospitalization and  transfusions.  On October 14, 2005 another colonoscopy revealed a bleeding  polyp site at the cecum which was treated.  In July 2005 she had followup  colonoscopy for carcinoma in situ at this cecal polyp.  There was no  evidence of recurrent polyp.  There was scar formation inside the prior  polypectomy site and one area was treated with thermal ablation after cold  biopsy.  She had a large anal papilla.  This was her last colonoscopy.  She  also had an EGD in April 2005 which revealed four 3-mm, bulbar ulcers.  She  was treated for H. pylori.  She is also chronically on NSAID for severe  osteoarthritis.   PAST SURGICAL HISTORY:  Left knee arthroscopy, right total hip replacement  in February 2007 and a hysterectomy.   FAMILY HISTORY:  Negative for chronic GI or liver disease.  No history of  colorectal neoplasia.   SOCIAL HISTORY:  She has been  married for over 42 years.  She has 6  children.  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  She  smokes cigarettes rarely.  No alcohol use.   REVIEW OF SYSTEMS:  Please see HPI for GI.  CONSTITUTIONAL:  No weight loss.  CARDIOPULMONARY:  See HPI.   PHYSICAL EXAMINATION:  VITAL SIGNS:  Temperature 98, pulse 94, respirations  18, blood pressure 155/79.  GENERAL:  A pleasant, slightly pale, elderly, Caucasian female in no acute  distress.  SKIN:  Warm and dry.  No jaundice.  HEENT:  Conjunctivae are pale.  Sclerae anicteric.  Oropharyngeal mucosa  moist and pink.  No lesions, erythema or exudate.  She is edentulous.  No  lymphadenopathy or thyromegaly.  CHEST:  Lungs are clear to auscultation.  CARDIOVASCULAR:  Cardiac exam reveals regular rate and rhythm.  Normal S1,  S2.  Faint systolic ejection murmur.  ABDOMEN:  Positive bowel sounds, soft, nontender, nondistended.  No  organomegaly or masses, no rebound tenderness or guarding.  EXTREMITIES:  No edema.   LABS:  As mentioned in HPI.  In addition white count 13,900; platelets  325,000.  Sodium 133, potassium 3.6, BUN 26, creatinine 0.8, glucose 133.  Digoxin 1.1.  BNP 263.   IMPRESSION:  The patient is a 65 year old Caucasian female with multiple  medical problems who now has a pulmonary embolus and is on anticoagulation  therapy.  She has a normocytic anemia which appears to be stable.  Based on  electronic medical record review she had a normal hemoglobin prior to a  right hip replacement in February 2007.  Since then, her hemoglobin appears  to have been stable in the 9 range.  Denies any evidence of overt GI  bleeding.  Hemoccult status is unknown.  Last colonoscopy July 2005 as  outlined above.  She also has a history of duodenal ulcer, status post H.  pylori treatment and chronic NSAID use.   RECOMMENDATIONS: 1.  Would recommend empirical  PPI therapy while she is on NSAIDs and      anticoagulation.  2.  Hemoccult stools.  3.  Will continue to follow with you and monitor hemoglobin while on      anticoagulation therapy.  If any  of her      Hemoccults are positive, she may need to have another colonoscopy given      a history of colorectal carcinoma in situ.  Further recommendations to      follow.  I would like to thank Dr. Patrica Duel for allowing [place] to take      part in the care of this patient.      Tana Coast, P.A.      Lionel December, M.D.  Electronically Signed    LL/MEDQ  D:  09/29/2005  T:  09/29/2005  Job:  [id number]   cc:   Patrica Duel, M.D.  Fax: [phone]

## 2010-11-08 NOTE — Op Note (Signed)
[name], [name]                 ACCOUNT NO.:  [id number]   MEDICAL RECORD NO.:  [id number]          PATIENT TYPE:  INP   LOCATION:  4702                         FACILITY:  [facility]   PHYSICIAN:  Elana Alm. Thurston Hole, M.D. DATE OF BIRTH:  1945/09/09   DATE OF PROCEDURE:  08/05/2005  DATE OF DISCHARGE:                                 OPERATIVE REPORT   PREOPERATIVE DIAGNOSIS:  Right hip DJD.   POSTOPERATIVE DIAGNOSIS:  Right hip DJD.   PROCEDURE:  Right total hip replacement using DePuy Press-Fit total hip  system with acetabulum 52 mm Press-Fit acetabulum with 10 degrees  polyethylene insert and two locking screws. Femoral component #5 Press-Fit  Summit femoral component with the high offset neck with +5 x 32 mm femoral  head.   SURGEON:  Elana Alm. Thurston Hole, M.D.   ASSISTANT:  Julien Girt, P.A.   ANESTHESIA:  General.   OPERATIVE TIME:  1 hour 20 minutes.   COMPLICATIONS:  None.   ESTIMATED BLOOD LOSS:  400 cc.   DESCRIPTION OF PROCEDURE:  [name] is brought to operating room on  08/05/2005, placed on operative table in supine position. She received Ancef  1 grams IV preoperatively for prophylaxis. After being placed under general  anesthesia she had a Foley catheter placed under sterile conditions. Her  right hip was examined. She had flexion to 90, extension to 0, internal and  external rotation of 20 degrees with approximately 1 inch of shortening of  the right leg compared to the left. She was then turned in the right lateral  decubitus position and secured on the bed with a Mark frame. Her right hip  and leg was then prepped using sterile DuraPrep and draped using sterile  technique. Originally through a 15 cm posterolateral greater trochanteric  incision initial exposure was made.  Underlying subcutaneous tissues were  incised in line with the skin incision. The iliotibial band and gluteus  maximus fascia was incised longitudinally revealing the underlying  sciatic  nerve which was carefully protected. The short external rotators of the hip  and hip capsule were released off the femoral neck insertion and tagged and  then the hip was posteriorly dislocated. The femoral head was found to have  grade 3 and 4 DJD and chondromalacia. A femoral neck cut was then 1-1/2 to 2  cm above the lesser trochanter in the appropriate amount of anteversion and  abduction. Carefully placed retractors were then placed around the  acetabulum and degenerative acetabular labral tissue was removed. The  acetabulum was found to have grade 3 and 4 DJD chondromalacia as well.  Sequential acetabular reaming was then carried out up to a 51 mm size in the  appropriate amount of abduction and anteversion and inclination and 52 mm  trial was hammered into position with an excellent fit. It was then removed  and the actual 52 mm cup was placed hammered into position in the  appropriate amount of anteversion, abduction and inclination with excellent  fit. It was further placed with two separate locking screws, a 20 mm screw  in a  12 o'clock position and a 15 mm screw in the 11 o'clock position. 10  degrees polyethylene liner was then placed with a 10 degree lip in the  posterolateral position and hammered into position locking into the shell.  Attention was then turned the femoral side. The proximal femur was exposed.  Sequential axial reamers were then used to ream up to a #5 size followed by  broaches to a #5 and with a #5 broach in place and a high offset neck with a  +5 x 32 mm femoral head trial, hip was reduced and taken through full range  of motion, found to be stable up to 80 degrees of internal rotation in both  neutral and 30 degrees of adduction and also stable in abduction external  rotation and leg lengths were equalized. At this point then the femoral  trial was removed. The femoral canal was jet lavage irrigated and then the  #5 Summit stem was hammered into  position with an excellent press fit with  the high offset neck and a +5 x 32 mm femoral head was hammered onto the  femoral neck with an excellent Morse taper fit. The hip was then reduced and  taken through full range of motion, found be stable. Leg lengths equalized.  At this point it is felt that all the components were excellent size, fit  and stability. The wound was again irrigated and then the short external  rotators of the hip and hip capsule were reattached to their femoral neck  insertion through two drill holes on the greater trochanter. The iliotibial  band and gluteus maximus fascia was closed with #1 Ethibond suture. The  subcutaneous tissues closed with 0 and 2-0 Vicryl, skin closed with skin  staples. Sterile dressings were applied. Hip abduction pillow applied. The  patient turned supine, checked for leg lengths that were equal. Rotation was  equal, pulses 2+ and symmetric. She was awakened, extubated and taken to  recovery room in stable condition. Needle, sponge counts correct x2 in the  case. Of note is that her defibrillator had been turned off per cardiology  prior to surgery and then this was turned back on the recovery room without  complications.      Robert A. Thurston Hole, M.D.  Electronically Signed     RAW/MEDQ  D:  08/05/2005  T:  08/06/2005  Job:  [id number]

## 2010-11-08 NOTE — Op Note (Signed)
[name], [name] NO.:  [id number]   MEDICAL RECORD NO.:  [id number]          PATIENT TYPE:  INP   LOCATION:  2019                         FACILITY:  [facility]   PHYSICIAN:  Doylene Canning. Ladona Ridgel, M.D.  DATE OF BIRTH:  1945/09/18   DATE OF PROCEDURE:  10/29/2005  DATE OF DISCHARGE:                                 OPERATIVE REPORT   PROCEDURE PERFORMED:  Removal of a previously implanted biventricular ICD  secondary to ICD lead endocarditis.   INTRODUCTION:  The patient is a 65 year old with a nonischemic  cardiomyopathy, congestive heart failure, left bundle branch block who  underwent BiV ICD implantation back in December 2005.  She did well and then  broke her hip back in February of 2007 and underwent a complicated hospital  course with total hip replacement.  The patient was admitted to hospital and  had fevers and positive blood cultures growing out Enterococcus.  A  transesophageal echo demonstrated a vegetation of over a centimeter on the  defibrillator lead and she is now referred for ICD system extraction.   PROCEDURE:  After informed consent was obtained, the patient was taken to  the operating room in the fasting state.  After usual preparation and  draping, general anesthesia was utilized and provided by Dr. Noreene Larsson and  others' supervision.  Lidocaine 30 mL was infiltrated over the left  infraclavicular region and a 9 cm incision was carried out over the old ICD  insertion site.  Electrocautery utilized to dissect down to the device and  it was removed with gentle traction.  The leads were free up from their  fibrous adhesions with electrocautery.  Initial attention was then turned to  removal of the left ventricular lead.  Gentle traction was given to this  lead and it was removed without difficulty.  Next, the atrial lead was  evaluated.  This was a 8-French active fixation Guidant lead and a stylet  was advanced into this lead and the helix of the lead  was retracted back  into the body of the lead.  Additional counterclockwise traction was then  placed on the lead and it was removed with gentle traction.  Next, the  defibrillator lead was evaluated.  Gentle traction would not free up the  lead.  A stylet was advanced into the lead and the helix was retracted  without difficulty and the additional gentle traction was placed on the lead  with no movement of the body of the lead.  The [facility] liberator stylet was  then advanced into the defibrillator lead after the lead itself had been cut  with scissors and the stylet was actively fixed in the body of the lead.  Gentle traction was then placed and again the lead did not move.  At this  point, a 13-French stainless steel sheath with a 12-French inner diameter  was advanced over the lead and into the subclavian vein without difficulty.  Next, the [facility] EDS powered electrosurgical dissection sheath was advanced  over the lead (this was a 13-French sheath and advanced down to the proximal  portion of the proximal coil) at this location.  There is very dense fibrous  binding site.  Traction, counter traction, pressure or counter pressure were  then delivered to the sheath but it could not be freed up.  At this point,  the 13-French polypropylene sheath was advanced over the defibrillator lead  and again pressure, counter pressure, traction and counter traction were  placed on the lead but again, it could not be free up from its binding site.  At this point, the 13-French EDS electrosurgical dissection sheath was then  re-advanced and replaced onto the defibrillator lead and this time with long  RF energy applications and a combination of pressure, counter pressure,  traction and counter traction, the sheath was peeled be advanced over the  proximal portion of the proximal coil and on down to the area of the  junction of the right atrium and right ventricle (tricuspid valve).  At this  point, again  the lead was densely adhered to the endocardium of the right  ventricle and again the [facility] electrosurgical dissection sheath was applied  to this binding site and again with long RF energy applications and a  combination of pressure, counter pressure, traction and counter traction,  the sheath was advanced over the binding site out to the tip of the lead  itself.  At this point, the lead remained fixed to the myocardium.  Holding  the lead body in stable position, the sheath was again advanced without RF  energy and once the sheath was out to the tip of the lead, gentle traction  was placed on the lead, resulting in freeing of lead from the endocardium.  At this point, the sheath was withdrawn with the lead out of the vascular  space, pressure was held for approximately 5 minutes to obtain hemostasis.  During this time the blood pressure was cycled and maintained above 100  mmHg.  The patient had no untoward events at this point.  X-ray demonstrated  no evidence of pericardial effusion supporting the finding of stable  hemodynamics.  At this point, the pocket was irrigated with kanamycin and  the incision was closed with a layer of 2-0 Vicryl, followed by layer of 3-0  Vicryl, followed by a layer of 4-0 Vicryl.  Benzoin was painted on the skin.  Steri-Strips were applied and pressure dressing was placed.  The patient was  returned to her room in satisfactory condition.   COMPLICATIONS:  There were no immediate procedure complications.   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           ______________________________  Doylene Canning. Ladona Ridgel, M.D.     GWT/MEDQ  D:  10/29/2005  T:  10/30/2005  Job:  [id number]   cc:   [place] Bing, M.D. [facility]  [address]  [address]  [place]  [place] [address]   Madelin Rear. Sherwood Gambler, MD Fax:  [phone]

## 2010-11-08 NOTE — Discharge Summary (Signed)
[name], [name]                 ACCOUNT NO.:  [id number]   MEDICAL RECORD NO.:  [id number]          PATIENT TYPE:  INP   LOCATION:  A204                          FACILITY:  [facility]   PHYSICIAN:  Patrica Duel, M.D.    DATE OF BIRTH:  22-Feb-1946   DATE OF ADMISSION:  09/25/2005  DATE OF DISCHARGE:  04/11/2007LH                                 DISCHARGE SUMMARY   DISCHARGE DIAGNOSES:  1.  Documented pulmonary embolism following total hip replacement      (08/05/2005).  2.  Severe osteoarthritis, status post several procedures, now with active      pain in her knee which has improved.  3.  Long-standing history of hypertension.  4.  Stable coronary artery disease.  5.  Nonischemic cardiomyopathy with documented ejection fraction of      approximately 20% to 25% in 05/2004.  6.  Status post implantation of cardioverter defibrillator in 05/2004.  7.  Chronic left bundle branch block.  8.  Status post hysterectomy.  9.  History of colonic polyps.  10. Anemia of chronic disease, improved.  11. Mild diabetes, controlled.   DETAILS REGARDING ADMISSION:  Please refer to the record.  Briefly, this 65-  year-old female with the above history presented to the emergency department  the day of admission with a three-day history of increasingly severe left  lateral thoracic pleuritic chest pain and some shortness of breath.  She  also had a cough and occasional fever.  There was no hemoptysis, syncope, or  chest pain compatible with angina.   In the emergency department, the patient was found to have an elevated D-  dimer.  CT scan revealed a pulmonary embolism in the left lower lobe and  left lower lobe infarct/atelectasis.  There was also a questionable small  right lower lobe embolus.  There was cardiomegaly noted.  No congestive  failure documented.   The patient was admitted with acute pulmonary embolism.  O2 saturations were  in the low 90s and vital signs were stable at the time of  admission.   COURSE IN THE HOSPITAL:  The patient was immediately given Lovenox and  Coumadin was begun.  She developed some fever and leukocytosis.  Possibility  of pneumonia prompted me to begin Rocephin.  She has responded very well,  had no recurrent fever.  Her white count is normalizing and she is feeling  much better.  Her pleuritic chest pain has resolved and she has had no  significant dyspnea.   Her major problem has been her left knee which has been evaluated by Dr.  Romeo Apple (in progress).  She was treated empirically with prednisone, low  dose, with good response.   Currently, pain is controlled.  Her INR is 2.2 and she is stable for  discharge at this time.   DISPOSITION:  Medications:  Lanoxin 0.25 daily, Celebrex (to be held), Tylox  5/325 q.4h. p.r.n. pain, Robaxin 500 p.r.n., chlorthalidone 12.5 daily,  Toprol XL 100 daily, metformin 500 b.i.d., Lotrel 5/20 one daily.  Coumadin  will be maintained at 5 mg daily.  An INR will be obtained in two days via  home health.  A prednisone Dosepak 5 mg 12-day period.  Dr. Romeo Apple will  follow the patient as an outpatient.      Patrica Duel, M.D.  Electronically Signed     MC/MEDQ  D:  10/01/2005  T:  10/01/2005  Job:  [id number]

## 2010-11-08 NOTE — Discharge Summary (Signed)
NAMENAMI, STRAWDER                 ACCOUNT NO.:  [id number]   MEDICAL RECORD NO.:  [id number]          PATIENT TYPE:  INP   LOCATION:  2019                         FACILITY:  [facility]   PHYSICIAN:  Gene Serpe, P.A. [facility]   DATE OF BIRTH:  June 03, 1946   DATE OF ADMISSION:  10/24/2005  DATE OF DISCHARGE:  11/14/2005                                 DISCHARGE SUMMARY   ADDENDUM:  I forgot to put down the dictation duration.  Please put 65  minutes.      Gene Serpe, P.A. [facility]     GS/MEDQ  D:  11/14/2005  T:  11/14/2005  Job:  [id number]

## 2010-11-08 NOTE — Discharge Summary (Signed)
NAMEARCELIA, PALS NO.:  [id number]   MEDICAL RECORD NO.:  [id number]          PATIENT TYPE:  INP   LOCATION:  2019                         FACILITY:  [facility]   PHYSICIAN:  Eckhart Mines Bing, M.D. LHCDATE OF BIRTH:  August 05, 1945   DATE OF ADMISSION:  10/24/2005  DATE OF DISCHARGE:  11/14/2005                                 DISCHARGE SUMMARY   PRIMARY CARDIOLOGIST:   Bing, M.D.   PRIMARY ELECTROPHYSIOLOGIST:  Doylene Canning. Ladona Ridgel, M.D.   PRINCIPAL DIAGNOSIS:  1.  Enterococcal endocarditis.      1.  Status post removal of previously-implanted biventricular          implantable cardioverter-defibrillator secondary to implantable          cardioverter-defibrillator lead endocarditis.  2.  Septic pulmonary emboli.      1.  Coumadin anticoagulation x6 months.  3.  Left knee osteoarthritis.      1.  Status post steroid injection.  4.  Question infrahilar mass/adenopathy.  5.  Left pleural effusion.      1.  Status post thoracentesis.  6.  Status post bilateral pneumonia.   SECONDARY DIAGNOSES:  1.  Nonischemic cardiomyopathy.      1.  Status post implantable cardioverter-defibrillator implantation in          December 2005.  2.  Chronic left bundle branch block.  3.  Diabetes mellitus.  4.  Hypertension.  5.  Status post right total hip arthroplasty February 2007.      1.  Complicated by postoperative pulmonary embolism.   PROCEDURES:  1.  Removal of biventricular implantable cardioverter-defibrillator by Doylene Canning. Ladona Ridgel, M.D., on May 9, secondary to implantable cardioverter-      defibrillator lead endocarditis.  2.  Ultrasound-guided thoracentesis May 12.  3.  A 2-D echocardiogram May 22.  4.  Peripherally-inserted central catheter line placement May 6.   REASON FOR ADMISSION:  Ms. Peto is a 65 year old female with known severe  nonischemic cardiomyopathy, status post ICD implantation December 2005, who  underwent a right total hip  replacement in February of this year,  complicated by postop pulmonary embolus, placed on Coumadin, who then  presented initially to [facility] with tachypnea, dyspnea and  possible pneumonia.  She was subsequently referred to Dr. Dietrich Pates in  consultation following blood cultures positive for Enterococcus with  question of possible infective endocarditis.  Of note, the patient also had  a repeat CT scan which confirmed the presence of the known pulmonary embolus  but with no evidence suggesting new pulmonary emboli.   The patient was subsequently referred for a transesophageal echocardiogram,  reviewed by Dr. Dorethea Clan, notable for presence of a mass on the pacer wire  concerning for endocarditis.  Arrangements were subsequently made for  transfer for further evaluation and treatment.   HOSPITAL COURSE:  Problem 1.  CARDIOVASCULAR:  The patient was referred to Dr Lewayne Bunting for  evaluation of possible pacer lead endocarditis.  She underwent subsequent  successful removal of the previously-implanted bi-V ICD by Dr. Ladona Ridgel on May  9 with no noted complications.   A 2-D echocardiogram on May 22 notable for EF or 20-30% with mild mitral  regurgitation and tricuspid regurgitation, increased left atrial pressure  with question of possible patent foramen ovale.  There was also mild aortic  regurgitation.   Postoperative course was benign with documented transient NSVT which was  asymptomatic.   Problem 2.  PULMONARY:  The patient was assisted in management by the  [place] pulmonary team on hospital day #10 for further evaluation and  management of development of right pleuritic chest pain with associated  increased dyspnea.  A CT scan showed a chronic left pulmonary artery clot  with question of mass, left hilar adenopathy adjacent to the clot, as well  as bilateral effusions.  There was also question of septic emboli of the  right middle lobe.  Dr. Delford Field concluded that the  patient most likely  sheared off vegetations of the ICD lead during explantation as the  explanation for the clinical decline that was noted and with the finding of  new emboli in the right lung.  He also recommended not proceeding with a  VATS procedure with lymph node biopsy unless lymph nodes were to get larger  in time.  He suspected that these were reactionary to the infection.   Dr. Delford Field also commented on the pleural effusion which had been  successfully tapped two days earlier, yielding transudative fluid.  Cultures  were negative from the pleural fluid as well.   Problem 3.  ORTHOPEDIC:  The patient was referred for further evaluation and  management of severe left knee pain.  She had just previously undergone  aspiration of the left knee by Dr. Fuller Canada at [facility] prior to  transfer.  A follow-up x-ray of the knee on May 20 showed a small joint  effusion with no acute changes.  The patient responded well to treatment  with Vicodin for pain.   Problem 4.  GASTROINTESTINAL:  The patient did require transfusion with two  units of packed RBCs for treatment of anemia with a hemoglobin low of 8.9  following admission.  Pre-discharge hemoglobin level was 10.3.  Stools for  occult blood were negative.   Problem 5.  INFECTIOUS DISEASE:  The patient was assisted with management by  the infectious disease team upon transfer from [facility].  Following placement on vancomycin at [facility], her antibiotics  regimen was changed to intravenous ampicillin and gentamicin, which was  continued for four full weeks.  At that time the recommendation was to  continue treatment for an additional two weeks with amoxicillin 500 mg  t.i.d.  Of note, the patient will also need follow-up blood cultures and a  CT scan of the chest in four to six weeks.   DISPOSITION:  The patient was cleared for discharge and transfer to [facility] facility, a  skilled nursing facility in  [place], on May 25.   MEDICATIONS AT TIME OF DISCHARGE:  1.  Coumadin 7.5 mg daily.  2.  Digoxin 0.25 mg daily.  3.  Lotensin 20 mg daily.  4.  Metoprolol 100 mg b.i.d.  5.  Protonix 40 mg daily.  6.  Hydrochlorothiazide 12.5 mg daily.  7.  Hydrocodone/APAP 5/500 mg one tablet q.4-6h. p.r.n.  8.  Amoxicillin 500 mg t.i.d. (x2 weeks).  9.  Metformin 500 mg b.i.d.  10. Anusol ointment one application as needed.  11. Ambien 5 mg q.h.s. p.r.n.  12. Tylenol  650 mg q.4h. p.r.n.  13. Zofran 4 mg IV q.6h. p.r.n.  14. Magnesium hydroxide 30 mL p.r.n. constipation.  15. Imodium 2-4 mg p.r.n. diarrhea.   DISCHARGE LABORATORY DATA:  INR 3.0.  Sodium 138, potassium 3.7, glucose  122, BUN 15, creatinine 0.6 on May 22.  WBC 9.8, hemoglobin 10.3, hematocrit  30.5 (MCV 85), platelets 480 on May 19.   INSTRUCTIONS:  The patient will need a follow-up protime on Tuesday, May 29.   FOLLOW-UP:  1.  Dr. Lewayne Bunting at [facility] in [place] on Monday, June 4,      at 9:45 a.m.  2.  Dr. [place] Bing at [facility] at [facility] on      Friday, June 15, at 1 p.m.  3.  The patient will need follow-up blood culture/CT scan of the chest in      four to six weeks.      Gene Serpe, P.A. [facility]      [place] Bing, M.D. [facility]  Electronically Signed    GS/MEDQ  D:  11/14/2005  T:  11/14/2005  Job:  [id number]   cc:   Madelin Rear. Sherwood Gambler, MD  Fax: [phone]   Vickki Hearing, M.D.  Fax: [phone]   Cliffton Asters, M.D.  Fax: [phone]   Shan Levans, M.D. [facility]  [address]  [place]  [place] [address]

## 2010-11-08 NOTE — Op Note (Signed)
NAME:  [name], [name]                 ACCOUNT NO.:  [id number]   MEDICAL RECORD NO.:  [id number]          PATIENT TYPE:  AMB   LOCATION:  DAY                           FACILITY:  [facility]   PHYSICIAN:  R. Roetta Sessions, M.D. DATE OF BIRTH:  [birth year]   DATE OF PROCEDURE:  07/24/2006  DATE OF DISCHARGE:                               OPERATIVE REPORT   PROCEDURE:  Colonoscopy with polyp ablation, saline assisted hot snare  polypectomy.   INDICATIONS FOR PROCEDURE:  The patient is a 65 year old lady who has  been on Coumadin, now having intermittent hematochezia.  She is anemic.  Coumadin has been stopped five days ago.  She has a history of carcinoma  in situ and polyps previously removed.  Colonoscopy is now being done.  This approach has been discussed with the patient at length.  Potential  risks, benefits and alternatives have been reviewed, questions answered.  Please see documentation in the medical record.   PROCEDURE NOTE:  O2 saturation, blood pressure, pulse and respirations  were monitored the entire procedure.   CONSCIOUS SEDATION:  Versed 7 mg IV, Demerol 125 mg IV in divided doses.   INSTRUMENT:  Pentax video chip system.   FINDINGS:  Digital rectal exam revealed no abnormalities.   ENDOSCOPIC FINDINGS:  The prep was adequate.  Examination of colonic  mucosa undertaken from the rectosigmoid junction where the scope was  advanced through the left, transverse, right colon to area of  appendiceal orifice, ileocecal valve and cecum.  These structures well  seen and photographed for the record.  From this level the scope was  slowly withdrawn.  All previously mentioned mucosal surfaces were again  seen.  The patient was noted have a 1 cm, beefy polyp adjacent to the  appendiceal orifice and cecum.  There is an adjacent 4 mm polyp as well.  There were left-sided diverticula and multiple diminutive polyps at the  rectosigmoid junction.   A large polyp in cecum was lifted away  from the colonic wall with 4 mL  of normal saline injected submucosally.  This lesion was removed totally  with two passes of snare cautery and was brought out with a Roth net.  The adjacent smaller polyp was removed via cold snare technique.  The  scope was then pulled down into the rectum where at the rectosigmoid  junction there were multiple diminutive polyps seen.  Thorough  examination of the rectal mucosa was carried out including a retroflex  view of the anal verge and there were some anal canal hemorrhoids and  there were multiple anal papilla, one pedunculated rather large 7 to 8  mm coming out of the canal well depicted on the photographs.  The large  polyp carried out from the right colon was delivered via the [place] net.  The scope was reintroduced into the rectum and the diminutive polyps  were ablated with the tip of the snare cautery unit.   There was also a 4 mm pedunculated polyp just inside the anal verge  which was destroyed with the tip of the cautery unit as  well.  The  patient tolerated the procedure well, was reacted in endoscopy.   IMPRESSION:  1. Anal canal hemorrhoids, anal papilla, diminutive rectal polyps      ablated as described above, otherwise normal rectum.  2. Left-sided diverticula.  3. Beefy 1 cm polyp in the cecum removed with saline assisted hot      snare polypectomy as described above.  Adjacent polyp removed with      cold snare technique.  Remainder of colon mucosa appeared normal.   RECOMMENDATIONS:  1. No aspirin or arthritis medications for 10 days.  The patient is      not to resume her Coumadin for 14 days.  2. Discussion:  I suspect her hematochezia is related to benign      anorectal bleeding in the setting of anticoagulation.   RECOMMENDATIONS:  1. Daily Metamucil or __________ fiber supplement.  Diverticulosis and      hemorrhoid literature provided Ms. Sharma Covert.  2. 10-day course of Anusol HC suppositories one per rectum at  bedtime.  3. Follow-up on path.  4. Further recommendations to follow.      Jonathon Bellows, M.D.  Electronically Signed     RMR/MEDQ  D:  07/24/2006  T:  07/24/2006  Job:  [id number]   cc:   Gerrit Friends. Dietrich Pates, MD, [facility]  [address]  [place], [place] [address]   Madelin Rear. Sherwood Gambler, MD  Fax: [phone]

## 2010-11-08 NOTE — Discharge Summary (Signed)
NAME:  [name], [name]                             ACCOUNT NO.:  [id number]   MEDICAL RECORD NO.:  [id number]                  PATIENT TYPE:   LOCATION:                                       FACILITY:   PHYSICIAN:  R. Roetta Sessions, M.D.              DATE OF BIRTH:  1946/05/21   DATE OF ADMISSION:  10/15/2003  DATE OF DISCHARGE:  10/17/2003                                 DISCHARGE SUMMARY   SERVICE:  Gastrointestinal Service.   FINAL DIAGNOSES:  1. Postpolypectomy bleed, status post therapeutic colonoscopy on October 13, 2003.  High grade dysplasia and a cecal polyp removed (preliminary     report) on October 13, 2003.  2. History of hypertension.  3. Hypokalemia, treated.  4. Hematochezia.  5. History of stable coronary artery disease.  6. History of H. pylori, treated.   DISCHARGE ACTIVITY:  Gradually resume normal activities.  Return to work on  Monday, May 2nd with no restrictions.   DISCHARGE DIET:  A 4 gram sodium diet.   ADMISSION MEDICATIONS:  The patient came to the hospital on:  1. Digoxin 0.25 mg 1 p.o. daily.  2. Lisinopril 20 mg p.o. daily.  3. Metoprolol 100 mg p.o. daily.  4. Patient has a prescription for potassium chloride 20 mEq daily.   HOSPITAL COURSE:  The patient is a 65 year old Caucasian female who had  therapeutic colonoscopy on October 13, 2003.  She became diaphoretic and  started passing gross blood per rectum on October 15, 2003.  She was seen in  the emergency room by Dr. Earl Lites and was hemodynamically stable.  Her  hemoglobin was reported to be 13 one month ago and on arrival at the  emergency room, her hemoglobin was 10.6.  The morning of April 24th, her  hemoglobin and hematocrit were 8.7 and 22.4.  She had no pain, but continued  to have rectal bleeding.  An examination showed gross blood in the rectal  vault.  The patient was given 2 units of packed red blood cells on October 14, 2003.  On Sunday, October 15, 2003, a therapeutic colonoscopy was  done and the  base of the previously biopsied polyp site was treated with gold probe and  epinephrine injection.  Good hemostasis was achieved and the patient is  stable with no bleeding.  On admission, the patient was noted to be  hypokalemic.  This was resolved with IV and p.o. medications.   DISCHARGE LABORATORY DATA:  On the date of discharge, her hemoglobin and  hematocrit are noted to be 10.4 and 29.9.  Her sodium is 143, potassium 3.6,  chloride 109, CO2-29.  Glucose 102 and her BUN, creatinine is 6/0.7, calcium  8.8.  The patient is noted to be H. pylori positive and this has been  treated.   DISCHARGE CONDITION:  After a brief hospital stay of two days, the patient  is stable and will be able to return to work on Oct 23, 2003 with no  restrictions.  The patient will gradually increase activities with no  restrictions.   NOTE:  The patient understands she is to have no aspirin or nonsteroidal  anti-inflammatories for a period of ten days.   DISCHARGE INSTRUCTIONS:  The patient is to return to Dr. Luvenia Starch office in  three months for followup.  A note to return to work and a prescription for  potassium chloride were given to the patient.     _____________________________________  ___________________________________________  Ashok Pall, PA                        Jonathon Bellows, M.D.   GC/MEDQ  D:  10/17/2003  T:  10/17/2003  Job:  [id number]

## 2010-11-08 NOTE — Letter (Signed)
June 29, 2006    [name]. Tina Gambler, MD  [address]  [place], [place] [address]   RE:  Tina, [name]  MRN:  [id number]  /  DOB:  05-15-1946   Dear [name]:   [name] returns to the office for continued assessment and treatment  of cardiomyopathy with congestive heart failure.  Since her last visit,  she has done quite well.  She has had an excellent appetite and has  gained weight at a steady pace.  She has not noted any dyspnea despite  returning to light housework.  She has no chest discomfort.  She notes  no palpitations.  There has been no lightheadedness nor syncope.  She  has not had any significant pedal edema.  She has noticed hematochezia  over the past week or so.  She notes blood on the toilet paper and a  pink tinge to the water in the toilet.  She does have a history of  hemorrhoids.   She is anticoagulated with warfarin and had a somewhat supratherapeutic  INR a few days ago.  Her other medications include:  1. KCl 20 mEq daily.  2. Digoxin 0.25 mg daily.  3. Metformin 500 mg b.i.d.  4. Carvedilol 25 mg b.i.d.  5. Spironolactone 25 mg daily.  6. Furosemide 40 mg b.i.d.  7. Enalapril 5 mg b.i.d.   EXAMINATION:  GENERAL:  Pleasant woman in no acute distress.  VITAL SIGNS:  The weight is 170, 16 pounds more than in November.  Blood  pressure 130/70, heart rate 66 and regular, respirations 16.  NECK:  No jugular venous distention.  Normal carotid upstrokes without  bruits.  LUNGS:  Clear.  CARDIAC:  Normal first and second heart sounds.  Modest basilar systolic  ejection murmur.  No third heart sound nor fourth heart sound  appreciated.  ABDOMEN:  Soft and nontender, no masses, no organomegaly, normal bowel  sounds.  EXTREMITIES:  Trace edema.  Distal pulses intact.   Chemistry profile from 4 days ago is normal except for a glucose of 148  and a BUN of 30.  Potassium is on the high side at 5.0.   IMPRESSION:  [name] is doing extremely well from a  cardiac  standpoint.  She has no clinical manifestations of congestive heart  failure despite marked impairment in left ventricular systolic function.  We will continue her cardiac medications except for increasing her dose  of enalapril to 10 mg b.i.d.   She has borderline hyperkalemia while taking spironolactone. Her  potassium supplement will be discontinued.  We will reassess serum  chemistry in 6 weeks.   She has hematochezia.  This may be related to hemorrhoids.  A CBC and  stool for hemoccult testing will be obtained and she will be referred  back to her gastroenterologist, Dr. Jena Gauss.   Vaccinations are up-to-date.  I will reassess this nice woman again in 3  months.  She is cautioned to call should she experience any significant  additional weight gain or other potentially cardiac problems.    Sincerely,      Gerrit Friends. Dietrich Pates, MD, [facility]  Electronically Signed    RMR/MedQ  DD: 06/29/2006  DT: 06/29/2006  Job #: [id number]

## 2010-11-08 NOTE — Procedures (Signed)
NAMEISLAM, [name] NO.:  [id number]   MEDICAL RECORD NO.:  [id number]          PATIENT TYPE:  INP   LOCATION:  A227                          FACILITY:  [facility]   PHYSICIAN:  Gerrit Friends. Dietrich Pates, MD, FACCDATE OF BIRTH:  May 19, 1946   DATE OF PROCEDURE:  04/10/2006  DATE OF DISCHARGE:  04/13/2006                                  ECHOCARDIOGRAM   REFERRING PHYSICIANS:  1. Patrica Duel, M.D.  2. Gerrit Friends. Dietrich Pates, MD, [facility].   CLINICAL DATA:  A 65 year old woman with congestive heart failure and  cardiomyopathy.  M-mode aorta 2.9, left atrium 5.1, septum 1.5, posterior  wall 1.2, LV diastole 6.0, LV systole 5.5.  1. Technically adequate echocardiographic study.  2. Normal right atrial size; moderate left atrial enlargement; atrial      septum deviates rightward suggesting elevated LA pressure.  3. Normal right ventricular size; mild hypertrophy; normal function.  4. Mild mitral valve thickening and calcification; mild to moderate      regurgitation.  5. Mild to moderate sclerosis of a trileaflet aortic valve; no stenosis      and minimal insufficiency by Doppler.  6. Normal aortic root dimensions; mild calcification of the wall.  7. Normal tricuspid valve; mild regurgitation; mild elevation in estimated      RV systolic pressure.  There is a mobile nodule that is highly echo      reflective measuring approximately 0.4 cm on the atrial side of the      tricuspid valve or annulus.  8. Normal pulmonic valve and proximal pulmonary artery.  9. Mild left ventricular dilatation; mild hypertrophy with      disproportionate septal thickening; extremely severe global      hypokinesis.  Estimated ejection fraction is 0.10.  10.Minimal pericardial effusion.  11.Small right pleural effusion.  12.Mild IVC dilatation; decrease in diameter with inspiration is normal.  13.Comparison with prior TEE performed Oct 23, 2005:  Marked interval      decrease in LV systolic function;  pacing wire no longer seen; a small      mass is now associated with the tricuspid valve; no aortic valve      vegetation seen.  Interval increase in mitral regurgitation.      Gerrit Friends. Dietrich Pates, MD, [facility]  Electronically Signed     RMR/MEDQ  D:  04/13/2006  T:  04/14/2006  Job:  [id number]

## 2010-11-08 NOTE — Procedures (Signed)
NAME:  [name], [name] NO.:  [id number]   MEDICAL RECORD NO.:  [id number]          PATIENT TYPE:  OUT   LOCATION:  SLEEP LAB                     FACILITY:  [facility]   PHYSICIAN:  Barbaraann Share, MD,FCCPDATE OF BIRTH:  29-Dec-1945   DATE OF STUDY:                            NOCTURNAL POLYSOMNOGRAM   REFERRING PHYSICIAN:   REFERRING PHYSICIAN:  Erle Crocker, M.D.   INDICATION FOR STUDY:  Hypersomnia with sleep apnea.  The patient  returns for CPAP titration after being diagnosed with mild sleep apnea  by a standard nocturnal polysomnography.   EPWORTH SLEEPINESS SCORE:  11.   SLEEP ARCHITECTURE:  The patient had a total sleep time of 403 minutes  with decreased slow wave sleep as well as REM.  Sleep onset latency was  normal at 20 minutes and onset to REM was prolonged at 179 minutes.  Sleep efficiency was fairly good at 91%.   RESPIRATORY DATA:  The patient underwent CPAP titration with a medium  Ultra Mirage CPAP mask.  The pressure was started at 5 cm of water and  increased as high as 8 cm for control of the patient's events and  snoring.  The patient on this study had primarily central apneas and  very few obstructive apneas.  The central apneas appear to increase in  frequency as the CPAP pressure was increased.  It appears her optimal  pressure is approximately 6 cm of water.   OXYGEN DATA:  There was transient O2 desaturation as low as 88% and no  clinically significant prolonged desaturation was noted.   CARDIAC DATA:  No clinically significant arrhythmia movement/parasomnia:  The patient was found to have 89 periodic leg movements, however, only  12 of these resulted in arousal or awakening.   IMPRESSION/RECOMMENDATION:  1. Good control of previously documented mild obstructive sleep apnea      with 6 cm of CPAP delivered by a medium Ultra Mirage CPAP mask.      The patient was titrated to  higher pressures, however, this seemed      to result in  induction of central apneas.  I would treat the      patient with 6 cm initially and see how she responds if this      treatment modality is chosen to treat her sleep apnea.  2. No clinically significant arrhythmias were noted.  3. The patient had moderate numbers of leg jerks with very mild sleep      disruption.  If the patient does not respond      appropriately to treatment of her obstructive sleep apnea, some      consideration could be given to a primary movement disorder of      sleep if clinically indicated.      Barbaraann Share, MD,FCCP  Diplomate, American Board of Sleep  Medicine  Electronically Signed     KMC/MEDQ  D:  08/18/2007 06:07:10  T:  08/18/2007 16:13:28  Job:  [id number]

## 2010-11-08 NOTE — Procedures (Signed)
NAME:  [name], [name] NO.:  [id number]   MEDICAL RECORD NO.:  [id number]          PATIENT TYPE:  INP   LOCATION:  A222                          FACILITY:  [facility]   PHYSICIAN:  Vida Roller, M.D.   DATE OF BIRTH:  03/21/1946   DATE OF PROCEDURE:  10/23/2005  DATE OF DISCHARGE:                                  ECHOCARDIOGRAM   PRIMARY:  Patrica Duel, M.D.   HISTORY OF PRESENT ILLNESS:  [name] is a 66 year old woman who has a  cardiomyopathy which we feel is nonischemic with an ejection fraction of 20-  25%.  She had a defibrillator placed in December 2005.  She presents with  enterococcal bacteremia with no obvious source and we were asked to do a  transesophageal echocardiogram to assess this.   DETAILS OF THE PROCEDURE:  Dr. Lionel December performed an upper endoscopy on  her prior to this transesophageal echocardiogram and had administered  conscious sedation to perform that study.  He visualized a small stricture  at the GE junction but otherwise she had no contraindication to  transesophageal echocardiogram and so immediately after his upper endoscopy  I proceeded with intubation with the transesophageal echocardiogram probe  into the transgastric position without difficulties.  I then imaged in the  transgastric position and then repositioned the scope into the mid  esophageal position without difficulty and removed the scope after the  procedure was completed.  The patient was recovered appropriate for an upper  endoscopy and did well, had to be administered an additional 2 mg of Versed  onto Dr. Patty Sermons.  I did not do any topical anesthetic.   RESULTS:  The left ventricle is dilated.  There is a depressed LV systolic  function estimated at 35-40% with global hypokinesis.  The right ventricle  appears to be normal size, at least top normal in size.  There was a  pacemaker wire in place in the RV.  There is no evidence in either ventricle  of a  significant mass.   The left atrium is dilated.  There is spontaneous echocardiographic contrast  in the entire left atrium.  There is no evidence of clot in the left atrial  appendage.  I did not entirely image all of the pulmonary veins.   The right atrium is dilated.  There is a pacer wire in the right atrium.  The pacer wire has a 1.0 x 1.2 cm echo-dense mass which is freely mobile off  the pacemaker wire just superior or cephalad from the tricuspid valve.  It  is definitely independent of that tricuspid leaflet, however.   The aortic valve is trileaflet.  There is a small nodular sessile lesion on  the noncoronary cusp which does not appear to be a vegetation.  There is  trace aortic insufficiency and no aortic stenosis.   The mitral valve is sclerotic.  There is no obvious vegetation.  There is  mild mitral regurgitation, no mitral stenosis.   The tricuspid valve has mild tricuspid regurgitation, no tricuspid stenosis,  and no vegetation.   The pulmonic  valve has mild to moderate insufficiency with no vegetation.   The aorta was seen to have a pedunculated atheroma in a descending portion  but no other significant lesions.   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  I will defer that decision to them.      Vida Roller, M.D.  Electronically Signed     JH/MEDQ  D:  10/23/2005  T:  10/24/2005  Job:  [id number]

## 2010-11-08 NOTE — Discharge Summary (Signed)
NAMEJAKYRIA, BLEAU                 ACCOUNT NO.:  [id number]   MEDICAL RECORD NO.:  [id number]          PATIENT TYPE:  INP   LOCATION:  4702                         FACILITY:  [facility]   PHYSICIAN:  Elana Alm. Thurston Hole, M.D. DATE OF BIRTH:  10-26-1945   DATE OF ADMISSION:  08/05/2005  DATE OF DISCHARGE:  08/08/2005                                 DISCHARGE SUMMARY   ADMISSION DIAGNOSES:  1.  End-stage degenerative joint disease, right hip.  2.  Hypertension.  3.  Hyperlipidemia.  4.  Diabetes.  5.  Nonischemic cardiomyopathy.   DISCHARGE DIAGNOSES:  1.  End-stage degenerative joint disease, right hip, status post total hip      replacement.  2.  Hypertension.  3.  Hyperlipidemia.  4.  Diet-controlled diabetes.  5.  Nonischemic cardiomyopathy.   HISTORY OF PRESENT ILLNESS:  Patient is a 65 year old white female with a  history of right hip pain x1 year.  It is constant pain that is getting  progressively worse.  She has tried anti-inflammatories and intra-articular  hip injections without success.  She understands the risks, benefits and  possible complications of her right total hip replacement and is without  question.  Cardiology cleared her for surgery and turned off her implantable  pacemaker with defibrillator preoperatively.  Postoperatively, this was  turned on.  Again, on postop day #1, the patient was tired, but pain was  under control.  Her hemoglobin was 9.9.  She was slightly hypokalemic at  3.3.  Her INR was 1.  Surgical wound was well approximated.  Her PCA was  DC'd.  The pillow was DC'd except when sleeping.  Cardiology was consulted.   On postop day #2, the patient continued to complain of weakness.  Potassium  was down to 2.9.  She was given 40 mEq of potassium b.i.d.  Hemoglobin was  9.2.  Surgical wound was well approximated.  She was transferred to 5000 in  stable condition.  Diabetes consult was ordered.   On postop day #3, vital signs were stable.   Temperature was 100.  Hemoglobin  was 9.1.  Surgical wound was well approximated.  She was transferred to  subacute.  Weightbearing as tolerated, on a regular diet.   DISCHARGE MEDICATIONS:  1.  Percocet 1-2 q.4-6h. p.r.n. pain.  2.  Robaxin 500 mg q.6h. p.r.n. muscle spasm.  3.  Coumadin per pharmacy protocol.  To maintain her INR between 2 and 3.  4.  Chlorthalidone 12.5 mg daily.  5.  Digoxin 0.25 mg daily.  6.  Lotrel 5/20 daily.  7.  Toprol XR 100 mg daily.   We will continue to follow her on subacute.  She will get PT daily, OT  daily, and CPM 0-90 degrees eight hours a day.      Kirstin Shepperson, P.A.      Robert A. Thurston Hole, M.D.  Electronically Signed    KS/MEDQ  D:  10/10/2005  T:  10/13/2005  Job:  [id number]

## 2010-11-08 NOTE — H&P (Signed)
NAME:  [name], [name]                 ACCOUNT NO.:  [id number]   MEDICAL RECORD NO.:  [id number]          PATIENT TYPE:  AMB   LOCATION:                                FACILITY:  [facility]   PHYSICIAN:  R. Roetta Sessions, M.D. DATE OF BIRTH:  March 20, 1946   DATE OF ADMISSION:  DATE OF DISCHARGE:  LH                              HISTORY & PHYSICAL   CHIEF COMPLAINT:  1. Anemia.  2. Hematochezia.   HPI:  [name] is a 65 year old female who has a history of chronic  anemia.  She was last seen last year.  When she underwent EGD by Dr.  Karilyn Cota on Oct 23, 2005, she was found to have small sliding hiatal hernia  with noncritical narrowing, a ring at the GE junction.  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  She had a large donut shaped polyp at the  appendix which was only partially removed by piecemeal fashion.  She had  left sided diverticula.  She developed a post polypectomy bleed which  required hospitalization and transfusions.  On October 14, 2005, another  colonoscopy revealed a bleeding polyp site at the cecum which was  treated.  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  There was scar formation inside a  prior polypectomy site and one area was treated with thermal ablation  after cold biopsy.  She had a large anal papilla.  She is status post H.  pylori treatment.  She had been on chronic Coumadin recently,  discontinued last week per her report.  She has history of  osteoarthritis, coronary artery disease, dilated nonischemic  cardiomyopathy, impaired left ventricular systolic function, borderline  hyperkalemia on spironolactone.  She has biventricular ICD.  She has  history of hypertension, fluid retention, diabetes mellitus.   She has history of bowels that alter between constipation and loose  stools.  She has been noticing pale pink to reddish  hematochezia.  Her  bleeding is worse with constipation.  She denies any abdominal pain,  nausea, vomiting, shortness of breath, palpitations.  She has been off  Coumadin since June 29, 2006.   CURRENT MEDICATIONS:  1. KCL 20 mEq daily.  2. Digoxin 0.25 mg daily.  3. Metformin 500 mg b.i.d.  4. Carvedilol 25 mg b.i.d.  5. Spironolactone 25 mg daily.  6. Lasix 40 mg b.i.d.  7. Enalapril 5 mg b.i.d.   ALLERGIES:  NO KNOWN DRUG ALLERGIES.   FAMILY HISTORY:  There is no known family history of colorectal  carcinoma or GI problem.   PAST MEDICAL HISTORY:  She had:  1. Left knee arthroscopy.  2. Right total hip replacement February of 2007.  3. Hysterectomy.   SOCIAL HISTORY:  She is married.  She has 6 children.  She has been on  disability since December of 2006.  She was previously an Engineer, maintenance.  She denies any alcohol.  She has a history of tobacco use.  Denies any drug use.   REVIEW OF SYSTEMS:  CONSTITUTIONAL:  Weight is stable.  Denies any fever  or chills.  CARDIOVASCULAR:  Denies any chest pain or palpitations.  PULMONARY:  Denies shortness of breath, dyspnea, cough, hemoptysis.  GI:  See HPI.  GU:  Denies any dysuria, hematuria, increased urinary  frequency.  HEMATOLOGICAL:  She does have history of post polypectomy  bleed.  Denies any bleeding or easy bruising.  Recently discontinued  Coumadin.   PHYSICAL EXAM:  VITAL SIGNS:  Weight 168.5, height 65 inches, temp 97.9,  blood pressure 120/76 and pulse 76.  GENERAL:  [name] is a 65 year old Caucasian female who is alert and  oriented, pleasant, cooperative, in no acute distress.  HEENT:  __________ nonicteric, conjunctiva pink.  Oropharynx pink and  moist without any lesions.  NECK:  Supple without any masses or thyromegaly.  CHEST/HEART:  Regular rate and rhythm with a 2/6 murmur noted.  LUNGS:  Clear to auscultation bilaterally.  ABDOMEN:  Positive bowel sounds x4.  No bruits auscultated.   Soft,  nontender, nondistended, without palpable masses or hepatosplenomegaly.  No rebound, tenderness or guarding.  EXTREMITIES:  Without clubbing or edema bilaterally.  SKIN:  Pink, warm and dry without any rash or jaundice.   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  She has been  noticing intermittent hematochezia.  She has chronic anemia and last  hemoglobin was June 29, 2006, was 10.8 with a hematocrit of 33.4 and  MCV of 88.4.  She discontinued Coumadin last week.  This case has been  discussed with Dr. Jena Gauss.  She is going to require follow up to  determine the etiology of her hematochezia, given history of ACIS.   PLAN:  1. Colonoscopy with Dr. Jena Gauss in the near future.  I discussed this      procedure including the risks and benefits which include but are      not limited to bleeding, infection, perforation, drug reaction      __________.  Consent will      be obtained.  2. Further recommendations pending procedure.  3. She will be off Coumadin for at least 5 days prior to the      procedure.      Nicholas Lose, N.P.      Jonathon Bellows, M.D.  Electronically Signed    KC/MEDQ  D:  07/06/2006  T:  07/06/2006  Job:  [id number]   cc:   Gerrit Friends. Dietrich Pates, MD, [facility]  [address]  [place], [place] [address]   Madelin Rear. Sherwood Gambler, MD  Fax: [phone]

## 2010-11-08 NOTE — Discharge Summary (Signed)
NAMEJUNELL, CULLIFER NO.:  [id number]   MEDICAL RECORD NO.:  [id number]          PATIENT TYPE:  INP   LOCATION:  4731                         FACILITY:  [facility]   PHYSICIAN:  Doylene Canning. Ladona Ridgel, M.D.  DATE OF BIRTH:  September 06, 1945   DATE OF ADMISSION:  06/10/2004  DATE OF DISCHARGE:  06/11/2004                                 DISCHARGE SUMMARY   DISCHARGE DIAGNOSES:  1.  Post procedure day #1 status post implant of Guidant CONTAK RENEWAL III      model H170 cardioverter defibrillator with BiV augmentation through left      ventricular lead.  2.  Part of the MADIT-CRT study.  3.  Nonischemic cardiomyopathy, ejection fraction 20 to 25%.  4.  Class II congestive heart failure on maximal medical therapy.  5.  Left bundle branch block.   SECONDARY DIAGNOSES:  1.  Exacerbation of congestive heart failure 1998.  Catheterization at that      time shows nonobstructive coronary artery disease.  2.  Hypertension.  3.  Status post hysterectomy.   PROCEDURE:  June 10, 2004, implantation of Guidant CONTAK RENEWAL  cardioverter defibrillator with biventricular capability.  Defibrillator  threshold study less than or equal to 11 joules.  Dr. Lewayne Bunting, surgeon.  The procedure preceded without complications.  The patient is part of the  MADIT-CRT study.   DISPOSITION:  Patient ready for discharge post procedure day #1.  Incision  is without swelling or erythema.  The patient is not complaining of any  excess pain at the incision site.  The patient is A pacing, V sensing, is  alert and oriented. No cardiac dysrhythmias, no respiratory distress.  Chest  x-ray shows the leads are intact and in appropriate position.  No  pneumothorax post implantation interrogation.  Configuration changed ring to  coil which gives 1.0 volt at 5 msec.  The patient discharged on the  following medication.   DISCHARGE MEDICATIONS:  1.  Naproxen 500 mg twice daily.  2.  Lotrel 5/20 daily.  3.   Chlorthalidone 25 mg one half tablet daily.  4.  Lanoxin 0.25 mg daily.  5.  Toprol XL 100 mg daily.  6.  The patient is to continue Tylenol PM as usual.  7.  Tylenol 325 mg one to two tablets q.4-6h. p.r.n. pain.   The patient has a mobility sheet and this has been discussed with the  patient prior to discharge.   DISCHARGE DIET:  Low sodium, low cholesterol.   The patient is instructed to keep her incision dry for the next week.  He is  to restart showers Monday, June 17, 2004.   FOLLOW UP:  [facility], [address], [place], [place]  [place].  Incision checked Tuesday, June 25, 2004, at 9:30 in the  morning.  She will see Dr. Ladona Ridgel, September 19, 2004, at 10:45 in the morning.   HISTORY OF PRESENT ILLNESS:  Ms. Duffy is a 65 year old female. She has a  history of nonischemic cardiomyopathy and class II congestive heart failure.  She was diagnosed with heart  failure several years ago when she presented in  1998 with an exacerbation.  She also underwent left heart catheterization at  that time which showed ejection fraction of 35% and nonobstructive coronary  disease.  However, repeat echocardiogram  showed ejection fraction more  currently in 20 to 25%.  She also has left bundle branch block.  She is  exhibiting class II congestive heart failure symptoms with maximal medical  therapy.  The patient has never had syncope or palpitations.  Referred for  additional evaluation. The patient qualifies for prophylactic implantation  of cardioverter defibrillator based on the SCD-HeFT trial.  Dr. Ladona Ridgel is  recommending referral for enrollment in the MADIT-CRT study randomizing  patient to single chamber defibrillator versus BiV defibrillator.  The risks  and benefits have been discussed with the patient and she wishes to proceed.  This is scheduled at the earliest possible time.   HOSPITAL COURSE:  The patient presents electively to [facility]  on June 10, 2004.  She underwent biventricular ICD placement.  Left ventricular lead is in the lateral epicardial vein with acceptable AV  pacing/sensing.  The patient discharging post procedure day #1 with no  complications.       GM/MEDQ  D:  06/11/2004  T:  06/12/2004  Job:  [id number]   cc:   Dawson Bing, M.D.   Madelin Rear. Sherwood Gambler, MD  [address]  [place]  [place] [address]  Fax: [phone]

## 2010-11-08 NOTE — Discharge Summary (Signed)
[name], [name] NO.:  [id number]   MEDICAL RECORD NO.:  [id number]          PATIENT TYPE:  OUT   LOCATION:  RDC                           FACILITY:  [facility]   PHYSICIAN:  Mariam Dollar, P.A.  DATE OF BIRTH:  23-Jun-1946   DATE OF ADMISSION:  04/16/2005  DATE OF DISCHARGE:  04/16/2005                                 DISCHARGE SUMMARY   DISCHARGE DIAGNOSES:  1.  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  2.  Coumadin for deep vein thrombosis prophylaxis.  3.  Postoperative anemia.  4.  Hypertension.  5.  Coronary artery disease with defibrillator.  6.  Hypokalemia.   HISTORY OF PRESENT ILLNESS:  This is a 65 year old female admitted on  February 13, and states a change of the right hip, no relief with  conservative care underwent a right total hip replacement on February 13 by  Dr. Thurston Hole. Placed on Coumadin for deep vein thrombosis prophylaxis,  touchdown weightbearing, hypokalemia 2.9 supplement added, blood pressure  monitored, medications have initially been held on February 15, secondary to  orthostatic changes. Postoperative anemia at 9.2. Blood sugar with some  variables 150 to 220, hemoglobin/A1C was pending. Dr. Corinda Gubler cardiology  follow up regarding weights and antihypertensive medications. The patient  was admitted to subacute care services.   PAST MEDICAL HISTORY:  See discharge diagnoses.   SOCIAL HISTORY:  No alcohol, remote smoker.   ALLERGIES:  None.   SOCIAL HISTORY:  Lives with husband in [place], husband and local family  work. The patient works at [employer] in housekeeping.   MEDICATIONS:  Medications prior to admission were:  Lotrel 5/20 daily.  Toprol XL 100 mg daily.  Lanoxin 0.25 mg daily.  Chlorthalidone 25 mg 1/2 tablet daily.   REHABILITATION HOSPITAL COURSE:  The patient was admitted to inpatient rehab  services with therapies initiated daily consisting  of physical therapy,  occupational therapy and rehabilitation nursing.   The following issues were addressed during the patient's rehabilitation  stay: Pertaining to Mrs. Czaplicki's right total hip replacement secondary to  osteoarthritis, surgical site is healing nicely, ambulating extended  distances, touchdown weightbearing. She remained on Oxycodone and Robaxin as  needed for breakthrough pain and good results. Coumadin for deep vein  thrombosis prophylaxis with latest INR of 2.8. Postoperative anemia is  stable with a hemoglobin of 9.8. Hematocrit: 27.7. Blood pressure is  monitored with follow up per Dr. Corinda Gubler cardiology for some orthostatic  changes early on in her acute hospital stay. Diastolic pressure 82 to 88.  Her Norvasc, Lotensin and Toprol remained on hold with plan for follow up  per cardiology services to resume these medications. They had initially been  held on February 15 with order for weights daily. She has some elevated  blood sugars with CBG's, 131, 107 and 133. She had a hemoglobin of 6.4. Her  diet was monitored. She received follow up per Dr. Corinda Gubler services,  cardiology secondary to her coronary artery disease with defibrillator,  which remained medically stable.  Functionally she was ambulating extended  house hold distances with a walker. Independent standby assist in all areas  of activities of daily living except for minimal assist lower body dressing,  home health therapies have been arranged. She would be discharged to home.   DISCHARGE MEDICATIONS:  Included:  Coumadin 1 mg daily for deep vein thrombosis prophylaxis to be completed on  September 02, 2005 and stop.  Norvasc 5 mg daily.  Lotensin 20 mg daily.  Toprol XL 100 mg daily with the address per cardiology services on when to  resume.  Lanoxin 0.25 mg daily.  Potassium chloride 20 meq daily.  Celebrex 100 mg daily.  Oxycodone immediate release 1 to 2 every 4 hours as needed for breakthrough   pain.   DISCHARGE ACTIVITIES:  Touchdown weightbearing with hip precautions.   DISCHARGE DIET:  Diabetic diet.   WOUND CARE:  Cleanse incision daily with warm soap and water.   FOLLOW UP:  Follow up with Dr. Thurston Hole.   Home health therapy per Genevieve Norlander to complete Coumadin protocol. Follow up  with Dr. Corinda Gubler cardiology.      Mariam Dollar, P.A.     DA/MEDQ  D:  08/14/2005  T:  08/14/2005  Job:  [id number]   cc:   Madelin Rear. Sherwood Gambler, MD  Fax: [phone]   Elana Alm. Thurston Hole, M.D.  Fax: [phone]   E. Graceann Congress, M.D.  [address]  [address]  [place]  [place] [address]

## 2010-11-08 NOTE — H&P (Signed)
NAMERAVON, MCILHENNY NO.:  [id number]   MEDICAL RECORD NO.:  [id number]          PATIENT TYPE:  INP   LOCATION:  A227                          FACILITY:  [facility]   PHYSICIAN:  Patrica Duel, M.D.    DATE OF BIRTH:  [birth year]   DATE OF ADMISSION:  04/09/2006  DATE OF DISCHARGE:  LH                                HISTORY & PHYSICAL   CHIEF COMPLAINT:  Shortness of breath.   HISTORY OF PRESENT ILLNESS:  This is a 65 year old female with a complicated  medical history.  She has well-documented nonischemic cardiomyopathy with an  ejection fraction of approximately 20%.  She is status post implantation of  ICD in December 2005.  She experienced an episode of endocarditis in May of  this year and was treated aggressively at [facility].  Apparently,  the ICD was removed, and the organism (enterococcus) was treated with 6-week  regimen of antibiotics with resolution.   The patient also has a history of pulmonary embolism following a total hip  replacement in February of this year, and she has been adequately  coumadinized since.  She has history of hypertension, well-controlled  diabetes, anemia of chronic disease, and is status post hysterectomy and  several orthopedic procedures, and has a history of colonic polyps without  evidence of recurrence.   The patient presented to the emergency department with a 1-week history of  increasingly severe dyspnea and orthopnea without associated chest pain,  syncope, palpitations, fever, chills, cough, hemoptysis or edema.  She was  found to have pulmonary edema on chest x-ray.  Other pertinent data included  a BNP of 1200, a hemoglobin of 10.6 (chronic), and INR of 3.1.  Renal  functions are normal except for a BUN of 40, blood sugar 142, potassium 3.5.  EKG showed a left bundle branch block, which apparently is chronic.  Cardiac  markers negative.   The patient was administered Lasix IV with a reasonable response and  was  admitted with congestive heart failure probably secondary to ischemic  cardiomyopathy.   There is no history of headache, neurologic deficits, nausea, vomiting,  diarrhea, melena, hematemesis, hematochezia, or genitourinary symptoms.   CURRENT MEDICATIONS:  Include Lanoxin 0.25 mg daily, chlorthalidone 12.5  daily, metformin 500 b.i.d., Coumadin 3 mg alternating with 5 mg.  Multiple  vitamins, Lopressor 50 mg b.i.d., and Klor-Con 10 mEq daily.   ALLERGIES:  NONE KNOWN.   PAST HISTORY:  As noted.   SOCIAL HISTORY:  No history of alcohol or tobacco abuse.   REVIEW OF SYSTEMS:  Negative except as mentioned   FAMILY HISTORY:  Noncontributory.   PHYSICAL EXAMINATION:  GENERAL:  A very pleasant female who currently is  alert and oriented in no acute distress.  VITAL SIGNS:  At presentation, temp 97 degrees; BP 144/93; pulse 109;  respirations 20, unlabored; O2 sat 98% on supplementation.  Arterial blood  gas revealed pO2 of 53 on room air at presentation.  HEENT:  Normocephalic,  atraumatic.  Pupils are equal.  Ears, nose, throat benign.  NECK:  Supple.  There  are 2 cm of jugular venous distension.  LUNGS:  Reveal rales to the mid-lung zones.  There is no dullness to  percussion.  HEART:  Heart sounds reveal S3, soft systolic murmur.  Heart rate is  currently approximately 90 and occasional ectopics are noted.  ABDOMEN:  Nontender, nondistended.  Bowel sounds active.  EXTREMITIES:  No clubbing, cyanosis, edema.  NEUROLOGIC:  Exam is nonfocal.   ASSESSMENT:  Pulmonary edema secondary to nonischemic cardiomyopathy with  documented ejection fraction approximately 20%.   PLAN:  Aggressive diuresis, cardiology consultation.  We will follow and  treat expectantly.      Patrica Duel, M.D.  Electronically Signed     MC/MEDQ  D:  04/10/2006  T:  04/10/2006  Job:  [id number]

## 2010-11-08 NOTE — Procedures (Signed)
NAME:  [name], [name] NO.:  [id number]   MEDICAL RECORD NO.:  [id number]          PATIENT TYPE:  OUT   LOCATION:  RAD                           FACILITY:  [facility]   PHYSICIAN:  Dubois Bing, M.D.  DATE OF BIRTH:  28-Oct-1945   DATE OF PROCEDURE:  04/12/2004  DATE OF DISCHARGE:                                  ECHOCARDIOGRAM   REFERRING PHYSICIAN:  Madelin Rear. Sherwood Gambler, MD/Robert Dietrich Pates, M.D.   CLINICAL DATA:  A 65 year old woman with cardiomyopathy.   M-MODE:  Aorta 3.3, left atrium 4.5, septum 1.6, posterior wall 1.5.  LV  diastole 6.3, LV systole 5.5.   CONCLUSION:  1.  Technically adequate echocardiographic study.  2.  Mild-to-moderate left atrial enlargement.  Normal right atrial size.      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  3.  Normal right ventricular size and function.  Mild right ventricular      hypertrophy.  4.  Mild aortic valvular sclerosis.  Mild calcification of the wall of the      proximal ascending aorta.  Trivial aortic insufficiency.  5.  Pulmonic valve not well seen.  Probably normal.  6.  Very mild mitral valve thickening.  Mild annular calcification and      regurgitation.  7.  Normal tricuspid valve.  Mild regurgitation.  8.  Estimated right ventricular systolic pressure at the upper limits of      normal.  9.  Mild left ventricular dilatation.  Mild-to-moderate hypertrophy.  Global      hypokinesis with virtual akinesis of the anterior septal region.      Overall, left ventricular systolic function is severely impaired with an      estimated ejection fraction of 0.25.  10. Doppler criteria for decreased left ventricular compliance.  11. Normal inferior vena cava.  12. Comparison with prior study of April 02, 2001:  Further impairment in      left ventricular systolic function.     [place]   RR/MEDQ  D:  04/12/2004  T:  04/12/2004  Job:  [id number]

## 2010-11-08 NOTE — Op Note (Signed)
NAMEAVONNE, BERKERY NO.:  [id number]   MEDICAL RECORD NO.:  [id number]          PATIENT TYPE:  INP   LOCATION:  4731                         FACILITY:  [facility]   PHYSICIAN:  Doylene Canning. Ladona Ridgel, M.D.  DATE OF BIRTH:  04/19/1946   DATE OF PROCEDURE:  06/10/2004  DATE OF DISCHARGE:                                 OPERATIVE REPORT   PROCEDURE PERFORMED:  Implantation of a biventricular ICD.   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   SURGEON:   INTRODUCTION:  The patient is a very pleasant 65 year old woman with a  dilated cardiomyopathy and class II heart failure and left bundle branch  block and a QR restoration of 180 msec.  She has an EF of 20%.  The patient  was referred for empiric ICD implantation but was to participate in the  MADIT CRT study randomizing patients like her to single chamber  defibrillator versus biventricular ICD.  She is now referred for ICD  implantation having randomized to the MADIT CRT program.   DESCRIPTION OF PROCEDURE:  After informed consent was obtained, the patient  was taken to the diagnostic EP lab in the fasting state.  After the usual  preparation and draping, intravenous Fentanyl and midazolam were given for  sedation.  There were 30 mL of lidocaine infiltrated into the left  infraclavicular region.  A 9 cm incision was carried out over this region  and electrocautery utilized to dissect down to the fascial plane.  There  were 10 mL of contrast injected into the left upper extremity venous system  demonstrating a patent left subclavian vein.  It was subsequently punctured  x3 with the Guidant model [id number] defibrillation lead advanced into the  right ventricle.  Mapping was carried out in the right ventricle and the  device was placed on the RV septum out near the RV  apex.  Next, the R-waves  were measured and found to be 22 mV with a pacing threshold of 0.7 volts at  0.5 msec and a pacing impedance of 645 ohms once the lead was actively  fixed.  A 10 volt pacing did not stimulate the diaphragm.  With the  defibrillator lead in satisfactory position, attention was then turned to  the placement of the atrial lead.  The P-waves measured 2.8 mV and the  atrial impedance was 548 ohms.  The atrial threshold 1.5 volts at 0.5 msec.  A 10 volt pacing did not stimulate the diaphragm in the atrium.  With both  atrial and defibrillation leads in satisfactory position, a 9.5 French peel-  away sheath was advanced into the left subclavian vein and the guiding  catheter and sheath were advanced into the coronary sinus.  This was carried  out without particular difficulty.  Venography of the coronary sinus  demonstrated a nice large lateral vein on the high lateral wall of the left  ventricle at approximately 2 to 3  o'clock on the mitral annulus.  This vein  was suitable for LV pacing.  The Guidant model [id number] Easy Track II IS-I left  ventricular pacing lead, serial number [id number], was then advanced by way of  the guiding catheter into the LV lead.  The LV waves measured 3 to 4 mV, the  pacing impedance was 1000 ohms and the pacing threshold was 1.8 volts at 0.5  msec.  With the LV lead in satisfactory position and no diaphragmatic  stimulation at 10 volts of pacing, the guiding catheter was removed from the  lead as was the 9.5 [place] sheath.  The LV, RV and RA leads were then  secured to the subpectoralis fascia with a figure-of-eight silk suture.  Sewing sleeves were secured with silk suture.  Electrocautery was utilized  to make a subcutaneous pocket.  Kanamycin irrigation was utilized to  irrigate the pocket and electrocautery utilized to assure hemostasis.  At  this point, the Guidant Contact Renewal III, model H170 dual chamber  biventricular defibrillator,  serial number [id number] was then connected to the  defibrillation leads and LV and RA leads and placed in the subcutaneous  pocket.  The generator was secured with silk suture.  Additional kanamycin  was utilized to irrigate the pocket and electrocautery utilized to assure  hemostasis.  At this point, the patient was more deeply sedated for  defibrillation threshold testing.   After the patient was more deeply sedated with Fentanyl and Versed, VF was  induced with a T-wave shock.  A 14 joule shock was delivered and this  terminated ventricular fibrillation and restored sinus rhythm.  Five minutes  was allowed to elapse and second DFT test was carried out.  Again, VF was  induced with a T-wave shock and again an 11 joule shock was delivered,  terminating ventricular fibrillation and restoring sinus rhythm.  At this  point, no additional defibrillation threshold testing was carried out and  the incision was closed with a layer of 2-0 Vicryl followed by a layer of 3-  0 Vicryl followed by a layer of 4-0 Vicryl.  Benzoin was painted on the skin  and Steri-Strips were applied.  Pressure dressing was placed and the patient  was returned to her room in satisfactory condition.   COMPLICATIONS:  There were no immediate procedure complications.   RESULTS:  This demonstrated successful implantation of a Guidant  biventricular implantable cardioverter defibrillator  in a patient with  nonischemic cardiomyopathy, class II heart failure, left bundle branch block  who was randomized to biventricular implantable cardioverter defibrillator  in the MADIT CRT trial.       GWT/MEDQ  D:  06/10/2004  T:  06/11/2004  Job:  [id number]   cc:    Bing, M.D.   Madelin Rear. Sherwood Gambler, MD  [address]  [place]  [place] [address]  Fax: [phone]

## 2010-11-08 NOTE — Letter (Signed)
April 28, 2006    Madelin Rear. Sherwood Gambler, MD  [address]  [place], [place] [address]   RE:  SUSA, BONES  MRN:  [id number]  /  DOB:  21-Nov-1945   Dear [name]:   Ms. Stevick returns to the office following a recent admission to [facility] with decompensated congestive heart failure.  This may relate to  the fact that her biventricular pacer became infected and was removed  approximately 5 months ago.  Her recent echocardiogram demonstrated  deterioration in left ventricular systolic function.  Nonetheless, she  responded well to treatment in hospital.  She currently has no exertional  dyspnea or orthopnea but experiences extreme weakness and fatigue.  The  frequency of nocturia has increased.   She has lost her health insurance but is able to afford her medications by  using drugs that can be obtained on [employer]'s low-cost program.  These  include:   1. Warfarin as directed.  2. Potassium chloride 20 mg daily.  3. Digoxin 0.25 mg daily.  4. Metformin 500 mg b.i.d.  5. Carvedilol 25 mg b.i.d.  6. Enalapril 5 mg daily.  7. Spironolactone 25 mg daily.  8. Furosemide 40 mg b.i.d.   PHYSICAL EXAMINATION:  GENERAL:  A pleasant woman in no acute distress.  VITAL SIGNS:  The weight is 154, one pound less than in September.  Blood  pressure 105/65, heart rate 68 and regular, respirations 16.  NECK:  No jugular venous distention.  Normal carotid upstrokes without  bruits.  LUNGS:  Clear.  CARDIAC:  Normal first heart sound.  Prominent splitting of the second heart  sound.  A modest systolic murmur at the left sternal border.  No third heart  sound appreciated.  ABDOMEN:  Soft and nontender.  No organomegaly.  EXTREMITIES:  Trace edema.   IMPRESSION:  Ms. Mccormick is doing fairly well considering the severity of her  cardiomyopathy.  She certainly merits disability, which will allow her to be  covered by the Medicare program.  We will attempt to assist her in applying  for those  programs.  Her dose of enalapril will be increased to 5 mg b.i.d.  Should she develop additional episodes of decompensation, consideration will  have to be given to replacing her device or to referral for possible cardiac  transplantation.  I will plan to follow her closely for the time being, and  we will see her again in 2 months.  A chemistry profile will be repeated in  1 month.    Sincerely,      Gerrit Friends. Dietrich Pates, MD, [facility]  Electronically Signed    RMR/MedQ  DD: 04/28/2006  DT: 04/29/2006  Job #: [id number]

## 2010-11-08 NOTE — Consult Note (Signed)
NAME:  [name], [name] NO.:  [id number]   MEDICAL RECORD NO.:  [id number]          PATIENT TYPE:  INP   LOCATION:  A204                          FACILITY:  [facility]   PHYSICIAN:  Vickki Hearing, M.D.DATE OF BIRTH:  07-05-1945   DATE OF CONSULTATION:  DATE OF DISCHARGE:                                   CONSULTATION   REQUESTING PHYSICIAN:  Dr. Patrica Duel   REASON FOR CONSULT:  Left knee pain.   MEDICAL HISTORY:  This is a 65 year old female in the hospital after  developing a pulmonary embolus and blood clot.  History and physical  reviewed and incorporated by reference.  She is being treated for that, then  developed pneumonia and some anemia.   She also had a right total hip replacement done [place] in February.  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  She has had trouble  walking of her left knee after her right hip replacement, which is not  surprising seeing that she needs joints in all lower extremities and both  upper extremities.   She complains that she cannot ambulate on the left knee.  She says the  doctor who did the surgery drew fluid.  It was yellow and the withdrawal of  the fluid did not help.   She complains of diffuse, severe, constant, aching left knee pain associated  with intermittent swelling and inability to ambulate.   Again, review of systems, past family and social history are recorded in the  medical record and incorporate by reference.   EXAMINATION:  GENERAL:  Reveals a medium to large frame female, alert and  oriented x3.  Mood and affect are normal.  She has normal development,  grooming, hygiene.  Good pulses, normal temperature, no edema, no swelling,  no varicose veins, normal lymph nodes.  Gait and station were not tested.  EXTREMITIES:  Left knee examination revealed tenderness to palpation  diffusely about the knee.  There was  crepitance on range of motion which was  limited to 10 degree flexion contracture and 80 degrees of flexion.  The  knee was stable.  However, there was audible crepitation.  Muscle strength  and tone were normal.  Skin was intact.  No subcutaneous nodules.  Coordination was deferred.  Reflexes were deferred.  Sensation was normal.  Her right lower extremity seemed to have normal hip flexion for a total hip,  no dislocation.  Hip was stable.  Muscle strength and tone were normal.  There was no malalignment except at the knee.   I have ordered radiographs.   IMPRESSION:  Osteoarthritis acute exacerbation.   PLAN:  At this point there is not a lot I can do in terms of treatment,  seeing that she is on Coumadin, she is being treated for pulmonary embolus.  I have informed her on several occasions in the past that she needs to have  surgery.  However, at the time she could not miss any work although now  since the hip replacement she  has been out since February, but this was all  expected.  I tried to warn her about this, to get this done expeditiously to  avoid complicating factors, and we will treat her symptomatically at this  time.  Radiographs pending.      Vickki Hearing, M.D.  Electronically Signed     SEH/MEDQ  D:  10/01/2005  T:  10/01/2005  Job:  [id number]

## 2010-11-08 NOTE — H&P (Signed)
NAME:  [name], [name] NO.:  [id number]   MEDICAL RECORD NO.:  [id number]          PATIENT TYPE:  INP   LOCATION:  A204                          FACILITY:  [facility]   PHYSICIAN:  Patrica Duel, M.D.    DATE OF BIRTH:  Nov 17, 1945   DATE OF ADMISSION:  09/26/2005  DATE OF DISCHARGE:  04/06/2007LH                                HISTORY & PHYSICAL   CHIEF COMPLAINT:  Chest pain and shortness of breath.   HISTORY OF PRESENT ILLNESS:  This is a 65 year old female with a relatively  complicated history.  She has a long-standing history of hypertension and  stable coronary artery disease.  She also has non-ischemic cardiomyopathy  with a documented EF of approximately 20-25% in December of 2005.  She is  status post implant of cardioverter defibrillator in December of 2005, as  well.  She had a history of class II congestive heart failure on maximum  medical therapy and a left bundle branch block per cardiology at that time.  She also has a history of hysterectomy.  She also has a history of colonic  polyps and had to be hospitalized in April of 2005 after experiencing a post-  polypectomy bleed.   The patient underwent right total hip replacement on August 05, 2005  secondary to advanced osteoarthritis.  She apparently did quite well and had  an uneventful postoperative course and was sent home after appropriate  rehabilitation on prophylactic Coumadin.  This was discontinued on September 02, 2005.   The patient presented to the emergency department on the day of admission  with a 3-day history of increasingly severe left lateral thoracic pleuritic  chest pain and some shortness of breath.  She also had a cough and  occasional fever.  She denies hemoptysis or syncope or chest pain compatible  with angina.  There is also no history of headache, neurologic deficits,  nausea, vomiting, diarrhea, melena, hematemesis, hematochezia or  genitourinary symptoms.   In the  emergency department, the patient was found to have an elevated D-  dimer.  A CT scan was appropriately ordered, which revealed a pulmonary  embolus in the left lower lobe with left lower lobe infarct/atelectasis.  There was a questionable small right lower lobe embolus.  There was also a  cardiomegaly noted.   The patient was admitted with acute pulmonary embolism.  Of note, 02  saturations were in the low 90s and vital signs were stable.   CURRENT MEDICATIONS:  1.  Lanoxin 0.25 daily.  2.  Celebrex 200 mg p.r.n.  3.  Tylox 5/325 q.4h. p.r.n. pain.  4.  Robaxin 500 p.r.n.  5.  Chlorthalidone 12.5 daily.  6.  Toprol-XL 100 mg daily.  7.  Metformin 500 mg b.i.d.  8.  Lotrel 5/20 one daily.   ALLERGIES:  None known.   PAST HISTORY:  As reviewed above.  Also a questionable history of diabetes  that was not stated in hospital records.   FAMILY HISTORY:  Noncontributory.   REVIEW OF SYSTEMS:  Negative, except as mentioned.   SOCIAL HISTORY:  Nonsmoker, nondrinker.   PHYSICAL EXAMINATION:  GENERAL:  A very pleasant female who currently is  alert and oriented, in no acute distress.  She is somewhat depressed, and  affect is flat.  VITAL SIGNS:  Within normal limits.  HEENT:  Normocephalic and atraumatic.  Pupils are equal.  Ears, nose and  throat benign.  NECK:  Supple.  No bruits noted.  There is no thyromegaly, masses or  lymphadenopathy.  LUNGS:  Diminished breath sounds in the left lower lobe region with marked  splinting upon inspiration.  Lung sounds are otherwise normal.  HEART:  Heart sounds are somewhat distant.  No murmurs, rubs, or gallops  noted.  ABDOMEN:  Nontender, nondistended.  Normal bowel sounds.  EXTREMITIES:  Without clubbing or cyanosis.  There is mild calf tenderness,  particularly on the left.  There is mild medial thigh tenderness on the  left, as well.  The surgical incision is currently well healed.  There is no  edema.  NEUROLOGIC:  Nonfocal.    ASSESSMENT:  Acute pulmonary embolism in a 65 year old female with a history  as noted above.   PLAN:  Admit for anticoagulation, Coumadin crossover, and close monitoring.  Further evaluation and treatment as indicated.      Patrica Duel, M.D.  Electronically Signed     MC/MEDQ  D:  09/26/2005  T:  09/26/2005  Job:  [id number]

## 2010-11-08 NOTE — Group Therapy Note (Signed)
NAME:  ZAREA, DIESING NO.:  [id number]   MEDICAL RECORD NO.:  [id number]          PATIENT TYPE:  INP   LOCATION:  2019                         FACILITY:  [facility]   PHYSICIAN:  Vickki Hearing, M.D.DATE OF BIRTH:  1945/07/01   DATE OF PROCEDURE:  10/24/2005  DATE OF DISCHARGE:                                   PROGRESS NOTE   Follow-up to consultation done on Oct 23, 2005.   Ms. Shilling continues to complain of pain in her left knee. Her sed rate was  100. C-reactive protein was 13.  She does have pneumonia.   We attempted aspiration of her left knee in three different locations and  were only able to obtain bloody fluid. There was no purulence.   IMPRESSION:  Hemarthrosis.      Vickki Hearing, M.D.  Electronically Signed     SEH/MEDQ  D:  10/24/2005  T:  10/24/2005  Job:  [id number]

## 2010-11-08 NOTE — Consult Note (Signed)
NAME:  [name], AYON NO.:  [id number]   MEDICAL RECORD NO.:  [id number]          PATIENT TYPE:  INP   LOCATION:  2019                         FACILITY:  [facility]   PHYSICIAN:  Vickki Hearing, M.D.DATE OF BIRTH:  1945/09/26   DATE OF CONSULTATION:  10/24/2005  DATE OF DISCHARGE:                                   CONSULTATION   ORTHOPEDIC SURGERY CONSULTATION:   REASON FOR CONSULTATION:  Swelling and pain left knee.   REQUESTING PHYSICIAN:  The consult was requested by [facility], Dr. Regino Schultze.   HISTORY AND PHYSICAL:  The history and physical has been reviewed and is  incorporated by reference.  This patient is known to my practice and has  longstanding osteoarthritis of the knees and required knee replacements but  she decided to delay surgery.  She eventually also needed hip replacement  and a right shoulder replacement.  She did have the hip replacement in  February, developed a pulmonary embolus and has had complications from that,  since that time she presented back to the hospital with pneumonia, during  her hospital stay she has had continued problems with ambulation, swelling  and decreased range of motion in the left and right knee.   The left knee is more symptomatic at this time.  She complains of severe  pain.  There is warmth and decreased motion with crepitus, grinding, and  inability to weightbear on the left.   On exam her knee range of motion is approximately 20-50, there was swelling,  appears to be a joint effusion, the knee is warm to touch.  She has painful  motion with any attempts at movement.   She has mild distal edema, good pulses, normal sensation in the knee.  Upper  extremities are unaffected.  Alignment, strength, range of motions, etc.,  are normal.   Radiograph was ordered.   DIFFERENTIAL DIAGNOSIS:  Sepsis, acute exacerbation of osteoarthritis,  sympathetic effusion.   PLAN:  We await x-rays.  We will  attempt aspiration of the left knee.      Vickki Hearing, M.D.  Electronically Signed     SEH/MEDQ  D:  10/24/2005  T:  10/24/2005  Job:  [id number]

## 2010-11-08 NOTE — Procedures (Signed)
NAME:  [name], [name] NO.:  [id number]   MEDICAL RECORD NO.:  [id number]          PATIENT TYPE:  OUT   LOCATION:  SLEEP CENTER                 FACILITY:  [facility]   PHYSICIAN:  Clinton D. Maple Hudson, MD, FCCP, FACPDATE OF BIRTH:  03/25/1946   DATE OF STUDY:                            NOCTURNAL POLYSOMNOGRAM   REFERRING PHYSICIAN:  Erle Crocker, M.D.   INDICATIONS FOR STUDY:  Insomnia with sleep apnea.  Epworth sleepiness  score 12/24, BMI 30, weight 180 pounds, height 65 inches, neck 14  inches.   Home medications charted and reviewed.   SLEEP ARCHITECTURE:  Total sleep time 405 minutes, with sleep deficiency  94%.  Stage I was 4%, stage II was 78%, stage III 1%.  Rem 16% of total  sleep time.  Sleep latency 9 minutes, REM latency 74 minutes.  Awake  after sleep onset 16 minutes.  Arousal index 10.7.  Bedtime medication  was 2 Tylenol PM taken at 7:45 p.m.   RESPIRATORY DATA:  Apnea-hypopnea index (AHI) 8.6 per hour, respiratory  disturbance index (RDI) 10.4.  These indicate mild obstructive sleep  apnea/hypopnea syndrome (normal range 0 to 5 per hour).  Events included  one obstructive apnea, one central apnea, and 56 hypopneas.  Events were  not positional.  REM AHI 12.8.  There were insufficient respiratory  events early enough in the night to permit use of CPAP titration by  split protocol on the study night.   OXYGEN DATA:  Moderate snoring with oxygen desaturation to a nadir of  89%.  Mean oxygen saturation through the study was 95.4% on room air.   CARDIAC DATA:  Sinus rhythm with frequent and multifocal PVCs, including  some ventricular bigeminy.   MOVEMENT/PARASOMNIA:  Mild periodic limb movement syndrome, index 3.6  per hour.  No bathroom trips.   IMPRESSION/RECOMMENDATION:  1. Mild obstructive sleep apnea/hypopnea syndrome, apnea-plus-hypopnea      index 8.6, respiratory disturbance index 10.4 per hour with      nonpositional events, moderate  snoring, and oxygen desaturation to      a nadir of 89%.  2. Scores in this range can be considered for CPAP therapy if more      conservative measures, such as weight loss, encouraged him to sleep      off flat of back, and treatment for upper airway obstruction or      nasal congestion do not relieve symptoms or medical problems      associated with sleep apnea.  Return for CPAP titration if      clinically      appropriate.  3. Mild periodic limb movement with arousal, 3.6 per hour.      Clinton D. Maple Hudson, MD, [facility], FACP  Diplomate, Biomedical engineer of Sleep Medicine  Electronically Signed     CDY/MEDQ  D:  06/13/2007 12:07:14  T:  06/14/2007 10:33:13  Job:  [id number]

## 2011-01-09 ENCOUNTER — Encounter: Payer: Self-pay | Admitting: Cardiology

## 2011-01-14 ENCOUNTER — Encounter: Payer: Self-pay | Admitting: Cardiology

## 2011-01-14 ENCOUNTER — Ambulatory Visit (INDEPENDENT_AMBULATORY_CARE_PROVIDER_SITE_OTHER): Payer: Medicare Other | Admitting: Cardiology

## 2011-01-14 DIAGNOSIS — N184 Chronic kidney disease, stage 4 (severe): Secondary | ICD-10-CM | POA: Insufficient documentation

## 2011-01-14 DIAGNOSIS — Z86711 Personal history of pulmonary embolism: Secondary | ICD-10-CM | POA: Insufficient documentation

## 2011-01-14 DIAGNOSIS — I10 Essential (primary) hypertension: Secondary | ICD-10-CM

## 2011-01-14 DIAGNOSIS — E119 Type 2 diabetes mellitus without complications: Secondary | ICD-10-CM

## 2011-01-14 DIAGNOSIS — Z720 Tobacco use: Secondary | ICD-10-CM

## 2011-01-14 DIAGNOSIS — K219 Gastro-esophageal reflux disease without esophagitis: Secondary | ICD-10-CM

## 2011-01-14 DIAGNOSIS — D649 Anemia, unspecified: Secondary | ICD-10-CM | POA: Insufficient documentation

## 2011-01-14 DIAGNOSIS — I2699 Other pulmonary embolism without acute cor pulmonale: Secondary | ICD-10-CM

## 2011-01-14 DIAGNOSIS — E1129 Type 2 diabetes mellitus with other diabetic kidney complication: Secondary | ICD-10-CM | POA: Insufficient documentation

## 2011-01-14 DIAGNOSIS — A491 Streptococcal infection, unspecified site: Secondary | ICD-10-CM

## 2011-01-14 DIAGNOSIS — G4733 Obstructive sleep apnea (adult) (pediatric): Secondary | ICD-10-CM

## 2011-01-14 DIAGNOSIS — F17200 Nicotine dependence, unspecified, uncomplicated: Secondary | ICD-10-CM

## 2011-01-14 DIAGNOSIS — B952 Enterococcus as the cause of diseases classified elsewhere: Secondary | ICD-10-CM

## 2011-01-14 DIAGNOSIS — M1990 Unspecified osteoarthritis, unspecified site: Secondary | ICD-10-CM | POA: Insufficient documentation

## 2011-01-14 DIAGNOSIS — F17201 Nicotine dependence, unspecified, in remission: Secondary | ICD-10-CM | POA: Insufficient documentation

## 2011-01-14 DIAGNOSIS — N189 Chronic kidney disease, unspecified: Secondary | ICD-10-CM

## 2011-01-14 DIAGNOSIS — D374 Neoplasm of uncertain behavior of colon: Secondary | ICD-10-CM | POA: Insufficient documentation

## 2011-01-14 DIAGNOSIS — I428 Other cardiomyopathies: Secondary | ICD-10-CM

## 2011-01-14 MED ORDER — POLYETHYLENE GLYCOL 3350 17 G PO PACK: 17.0000 g | PACK | Freq: Every day | ORAL | Status: AC

## 2011-01-14 NOTE — Assessment & Plan Note (Signed)
She continues to do remarkably well with very severe left ventricular dysfunction and a previous hospitalization during which maximal medical therapy appeared only marginally effective.  Her current good performance status suggests that there may have been recovering left ventricular systolic function, but imaging will not be undertaken since the result will not affect therapy.  We will continue current medications.

## 2011-01-14 NOTE — Assessment & Plan Note (Signed)
She continues to remain free of thromboembolic disease without requiring full anticoagulation.

## 2011-01-14 NOTE — Assessment & Plan Note (Signed)
We will continue to monitor renal function, which has been stable for at least the past 3 years.

## 2011-01-14 NOTE — Patient Instructions (Addendum)
Your physician has recommended you make the following change in your medication: start taking Miralax daily as needed   Your physician recommends that you schedule a follow-up appointment in: 6 months

## 2011-01-14 NOTE — Assessment & Plan Note (Signed)
Blood pressure control has been excellent. Current therapy will be continued. 

## 2011-01-14 NOTE — Progress Notes (Signed)
HPI : [name] returns to the office as scheduled after a six-month interval during which she has done beautifully.  Hip pain, which was debilitating at her last visit, has improved spontaneously providing her with increased mobility and decreased suffering, although she continues to require a walker and chronic narcotic therapy.  She has class II dyspnea on exertion and no chest pain, orthopnea, PND nor edema.  She has developed substantial constipation for which she takes occasional ex-lax.  Current Outpatient Prescriptions on File Prior to Visit  Medication Sig Dispense Refill  . carvedilol (COREG) 25 MG tablet Take 25 mg by mouth 2 (two) times daily.        . diphenhydramine-acetaminophen (TYLENOL PM EXTRA STRENGTH) 25-500 MG TABS Take 1 tablet by mouth at bedtime as needed.        . furosemide (LASIX) 80 MG tablet Take 80 mg by mouth. 1 tab am. 1/2 tab pm       . glimepiride (AMARYL) 4 MG tablet Take 4 mg by mouth daily.        Marland Kitchen glucose blood test strip 1 each by Other route daily. Use as instructed       . HYDROcodone-acetaminophen (LORCET 10/650) 10-650 MG per tablet Take 1 tablet by mouth 3 (three) times daily as needed. For pain       . Lancets MISC by Does not apply route daily.        Marland Kitchen lisinopril (PRINIVIL,ZESTRIL) 20 MG tablet Take 20 mg by mouth daily.        . metFORMIN (GLUCOPHAGE) 1000 MG tablet Take 1,000 mg by mouth 2 (two) times daily.        . pravastatin (PRAVACHOL) 40 MG tablet Take 80 mg by mouth daily.        Marland Kitchen spironolactone (ALDACTONE) 25 MG tablet Take 25 mg by mouth daily.           No Known Allergies    Past medical history, social history, and family history reviewed and updated.  ROS: See history of present illness.  PHYSICAL EXAM: BP 138/65  Pulse 76  Ht 5\' 5"  (1.651 m)  Wt 86.183 kg (190 lb)  BMI 31.62 kg/m2  SpO2 99%  General-Well developed; no acute distress Body habitus-overweight Neck-No JVD; no carotid bruits Lungs-clear lung fields; resonant  to percussion Cardiovascular-normal PMI; normal S1 and S2; modest systolic ejection murmur; fourth heart sound present Abdomen-normal bowel sounds; soft and non-tender without masses or organomegaly Musculoskeletal-No deformities, no cyanosis or clubbing Neurologic-Normal cranial nerves; symmetric strength and tone Skin-Warm, no significant lesions Extremities-distal pulses intact; no edema  ASSESSMENT AND PLAN:

## 2011-01-14 NOTE — Assessment & Plan Note (Signed)
Dr. Nickola Major was unable to assist with the patient's care due to insurance coverage issues.  If symptoms worsen, referral to a pain clinic or alternate orthopaedic surgeon may be appropriate.

## 2011-01-20 ENCOUNTER — Encounter: Payer: Self-pay | Admitting: Cardiology

## 2011-03-24 LAB — BASIC METABOLIC PANEL
Calcium: 9.7
GFR calc Af Amer: 58 — ABNORMAL LOW
GFR calc non Af Amer: 48 — ABNORMAL LOW
Sodium: 138

## 2011-03-24 LAB — GLUCOSE, CAPILLARY
Glucose-Capillary: 108 — ABNORMAL HIGH
Glucose-Capillary: 108 — ABNORMAL HIGH
Glucose-Capillary: 128 — ABNORMAL HIGH
Glucose-Capillary: 145 — ABNORMAL HIGH
Glucose-Capillary: 65 — ABNORMAL LOW
Glucose-Capillary: 74
Glucose-Capillary: 97

## 2011-03-24 LAB — BLOOD GAS, ARTERIAL
Acid-base deficit: 8.1 — ABNORMAL HIGH
Bicarbonate: 17.9 — ABNORMAL LOW
Delivery systems: POSITIVE
FIO2: 1
O2 Saturation: 99.5
Patient temperature: 37
TCO2: 16.9

## 2011-03-24 LAB — CBC
HCT: 34.6 — ABNORMAL LOW
Hemoglobin: 11.9 — ABNORMAL LOW
MCHC: 34.5
MCV: 92.5
RBC: 3.74 — ABNORMAL LOW

## 2011-03-24 LAB — CARDIAC PANEL(CRET KIN+CKTOT+MB+TROPI)
CK, MB: 3.3
CK, MB: 3.6
Relative Index: INVALID
Total CK: 58
Troponin I: 0.09 — ABNORMAL HIGH

## 2011-03-24 LAB — DIFFERENTIAL
Basophils Absolute: 0
Basophils Relative: 1
Eosinophils Absolute: 0.1
Eosinophils Relative: 2
Lymphocytes Relative: 34
Monocytes Absolute: 0.3

## 2011-03-24 LAB — POCT I-STAT, CHEM 8
BUN: 30 — ABNORMAL HIGH
Calcium, Ion: 1.16
Hemoglobin: 12.6
Sodium: 138
TCO2: 21

## 2011-03-24 LAB — POCT CARDIAC MARKERS: Myoglobin, poc: 48

## 2011-07-14 ENCOUNTER — Ambulatory Visit (INDEPENDENT_AMBULATORY_CARE_PROVIDER_SITE_OTHER): Payer: Medicare Other | Admitting: Cardiology

## 2011-07-14 ENCOUNTER — Encounter: Payer: Self-pay | Admitting: Cardiology

## 2011-07-14 VITALS — BP 82/44 | HR 92 | Resp 18 | Ht 65.0 in | Wt 186.0 lb

## 2011-07-14 DIAGNOSIS — F17200 Nicotine dependence, unspecified, uncomplicated: Secondary | ICD-10-CM

## 2011-07-14 DIAGNOSIS — E119 Type 2 diabetes mellitus without complications: Secondary | ICD-10-CM

## 2011-07-14 DIAGNOSIS — I10 Essential (primary) hypertension: Secondary | ICD-10-CM

## 2011-07-14 DIAGNOSIS — D649 Anemia, unspecified: Secondary | ICD-10-CM

## 2011-07-14 DIAGNOSIS — E785 Hyperlipidemia, unspecified: Secondary | ICD-10-CM

## 2011-07-14 DIAGNOSIS — I428 Other cardiomyopathies: Secondary | ICD-10-CM

## 2011-07-14 DIAGNOSIS — Z720 Tobacco use: Secondary | ICD-10-CM

## 2011-07-14 DIAGNOSIS — M1990 Unspecified osteoarthritis, unspecified site: Secondary | ICD-10-CM

## 2011-07-14 DIAGNOSIS — N189 Chronic kidney disease, unspecified: Secondary | ICD-10-CM

## 2011-07-14 DIAGNOSIS — I447 Left bundle-branch block, unspecified: Secondary | ICD-10-CM

## 2011-07-14 MED ORDER — LISINOPRIL 10 MG PO TABS: 10.0000 mg | ORAL_TABLET | Freq: Every day | ORAL | Status: DC

## 2011-07-14 NOTE — Assessment & Plan Note (Addendum)
Patient continues to do amazingly well symptomatically despite very severe left ventricular dysfunction, which has persisted for the past 14 years.  Due to relative hypotension, albeit asymptomatic, her dose of lisinopril will be reduced to 10 mg per day.  She will follow blood pressures at home and call for very low values or for symptoms related to hypotension.

## 2011-07-14 NOTE — Assessment & Plan Note (Signed)
Patient reports minimal cigarette smoking since 2010.  She is encouraged to avoid tobacco use entirely.

## 2011-07-14 NOTE — Assessment & Plan Note (Addendum)
Degenerative joint disease of the hip continues to significantly impair her quality of life, but [name] appears to have come to an accommodation with her disease.  She uses a walker with adequate mobility and does not wish to reconsider orthopaedic surgery.  No falls have occurred for at least the past 6 months.

## 2011-07-14 NOTE — Assessment & Plan Note (Signed)
Stable renal insufficiency, at least since 2009.  Hopefully, she will continue to avoid progression of chronic kidney disease.

## 2011-07-14 NOTE — Patient Instructions (Addendum)
Your physician recommends that you schedule a follow-up appointment in: 9 months  Your physician recommends that you return for lab work in:  1 - TODAY (Anemia profile, CMET and TSH) 2 - 6 months (BMET,CBC)  - You will receive a letter 3 - Stool cards x 3 and return to office as soon as possible  Call us for dizziness, fainting, or systolic blood pressure (upper number) of less than 80  Your physician has recommended you make the following change in your medication:  1 - DECREASE lisinopril to 10 mg daily

## 2011-07-14 NOTE — Assessment & Plan Note (Signed)
Anemia persists, at least as of the most recent measurement 7 months ago.  Basic studies including serum iron, iron-binding, ferritin, B12, folate and stool for Hemoccult testing will be obtained.

## 2011-07-14 NOTE — Assessment & Plan Note (Signed)
Most recent lipid profile in 11/2010 showed very adequate values, especially in light of the absence of known vascular disease.

## 2011-07-14 NOTE — Assessment & Plan Note (Signed)
Adequately controlled with medications primarily utilized to treat cardiomyopathy

## 2011-07-14 NOTE — Assessment & Plan Note (Addendum)
Multiple fasting glucose values measured over the past year generally less than 150 except for a value of 177 in 09/2010. CBGs at home are all less than 150 and typically 120-130

## 2011-07-14 NOTE — Progress Notes (Signed)
Patient ID: [name], female   DOB: [birth year], 66 y.o.   MRN: [id number] HPI: Scheduled return visit for this very nice woman with long-standing cardiomyopathy.  She continues to do well from a cardiac standpoint and denies all symptoms including dyspnea; however, activity is very limited as the result of severe degenerative joint disease of the left hip.  The pain related to her orthopaedic issues has been tolerable.  She was unable to see [name] as the result of insurance issues.  Prior to Admission medications   Medication Sig Start Date End Date Taking? Authorizing Provider  carvedilol (COREG) 25 MG tablet Take 25 mg by mouth 2 (two) times daily.     Yes Historical Provider, MD  diphenhydramine-acetaminophen (TYLENOL PM EXTRA STRENGTH) 25-500 MG TABS Take 1 tablet by mouth at bedtime as needed.     Yes Historical Provider, MD  furosemide (LASIX) 80 MG tablet Take 80 mg by mouth. 1 tab am. 1/2 tab pm    Yes Historical Provider, MD  glimepiride (AMARYL) 4 MG tablet Take 4 mg by mouth daily.     Yes Historical Provider, MD  glucose blood test strip 1 each by Other route daily. Use as instructed    Yes Historical Provider, MD  HYDROcodone-acetaminophen (LORCET 10/650) 10-650 MG per tablet Take 1 tablet by mouth 3 (three) times daily as needed. For pain    Yes Historical Provider, MD  Lancets MISC by Does not apply route daily.     Yes Historical Provider, MD  lisinopril (PRINIVIL,ZESTRIL) 20 MG tablet Take 20 mg by mouth daily.     Yes Historical Provider, MD  metFORMIN (GLUCOPHAGE) 1000 MG tablet Take 1,000 mg by mouth 2 (two) times daily.     Yes Historical Provider, MD  pravastatin (PRAVACHOL) 40 MG tablet Take 80 mg by mouth daily.     Yes Historical Provider, MD  spironolactone (ALDACTONE) 25 MG tablet Take 25 mg by mouth daily.     Yes Historical Provider, MD  No Known Allergies    Past medical history, social history, and family history reviewed and updated.  ROS: Denies  orthopnea, PND, lightheadedness, syncope, pedal edema, palpitations or chest discomfort.  PHYSICAL EXAM: BP 82/44  Pulse 92  Resp 18  Ht 5\' 5"  (1.651 m)  Wt 84.369 kg (186 lb)  BMI 30.95 kg/m2 ; repeat blood pressure 90/50 sitting and 78/40 standing General-Well developed; no acute distress Body habitus-moderately overweight Neck-No JVD; no carotid bruits Lungs-clear lung fields; resonant to percussion; slightly decreased breath sounds on the left Cardiovascular-normal PMI; normal S1 and S2; minimal systolic murmur at the left sternal border; no 3rd heart sound Abdomen-normal bowel sounds; soft and non-tender without masses or organomegaly Musculoskeletal-No deformities, no cyanosis or clubbing Neurologic-Normal cranial nerves; symmetric strength and tone Skin-Warm, no significant lesions Extremities-distal pulses intact; no edema  EKG: Normal sinus rhythm with frequent PVCs; left bundle branch block; left axis deviation  ASSESSMENT AND PLAN:  Tina Bing, MD 07/14/2011 11:21 AM

## 2011-07-15 ENCOUNTER — Encounter: Payer: Self-pay | Admitting: *Deleted

## 2011-07-22 ENCOUNTER — Encounter (INDEPENDENT_AMBULATORY_CARE_PROVIDER_SITE_OTHER): Payer: Medicare Other | Admitting: *Deleted

## 2011-07-22 ENCOUNTER — Other Ambulatory Visit: Payer: Self-pay | Admitting: Cardiology

## 2011-07-22 DIAGNOSIS — Z7901 Long term (current) use of anticoagulants: Secondary | ICD-10-CM

## 2011-07-23 LAB — COMPREHENSIVE METABOLIC PANEL
AST: 10 U/L (ref 0–37)
Albumin: 4.5 g/dL (ref 3.5–5.2)
Alkaline Phosphatase: 69 U/L (ref 39–117)
BUN: 21 mg/dL (ref 6–23)
Potassium: 5 mEq/L (ref 3.5–5.3)
Sodium: 140 mEq/L (ref 135–145)
Total Protein: 7.3 g/dL (ref 6.0–8.3)

## 2011-07-23 LAB — FERRITIN: Ferritin: 71 ng/mL (ref 10–291)

## 2011-07-23 LAB — VITAMIN B12: Vitamin B-12: 260 pg/mL (ref 211–911)

## 2011-07-23 LAB — IRON AND TIBC: UIBC: 243 ug/dL (ref 125–400)

## 2011-07-23 LAB — FOLATE: Folate: 5.6 ng/mL

## 2011-07-25 ENCOUNTER — Other Ambulatory Visit: Payer: Self-pay

## 2011-07-25 DIAGNOSIS — D649 Anemia, unspecified: Secondary | ICD-10-CM

## 2011-07-25 LAB — POC HEMOCCULT BLD/STL (HOME/3-CARD/SCREEN)
Card #1 Date: NEGATIVE
Card #3 Date: NEGATIVE

## 2011-07-28 ENCOUNTER — Encounter: Payer: Self-pay | Admitting: *Deleted

## 2011-08-13 ENCOUNTER — Other Ambulatory Visit: Payer: Self-pay | Admitting: *Deleted

## 2011-08-13 DIAGNOSIS — I10 Essential (primary) hypertension: Secondary | ICD-10-CM

## 2012-01-12 ENCOUNTER — Other Ambulatory Visit: Payer: Self-pay | Admitting: *Deleted

## 2012-01-12 DIAGNOSIS — I10 Essential (primary) hypertension: Secondary | ICD-10-CM

## 2012-01-26 ENCOUNTER — Encounter: Payer: Self-pay | Admitting: *Deleted

## 2012-04-12 ENCOUNTER — Ambulatory Visit: Payer: Medicare Other | Admitting: Cardiology

## 2012-05-07 ENCOUNTER — Telehealth: Payer: Self-pay | Admitting: Cardiology

## 2012-05-09 NOTE — Telephone Encounter (Signed)
Erroneous Entry  

## 2012-05-10 ENCOUNTER — Encounter: Payer: Self-pay | Admitting: Cardiology

## 2012-05-10 ENCOUNTER — Ambulatory Visit (INDEPENDENT_AMBULATORY_CARE_PROVIDER_SITE_OTHER): Payer: Medicare Other | Admitting: Cardiology

## 2012-05-10 VITALS — BP 132/70 | HR 81 | Ht 65.0 in | Wt 184.0 lb

## 2012-05-10 DIAGNOSIS — R32 Unspecified urinary incontinence: Secondary | ICD-10-CM

## 2012-05-10 DIAGNOSIS — A491 Streptococcal infection, unspecified site: Secondary | ICD-10-CM

## 2012-05-10 DIAGNOSIS — I10 Essential (primary) hypertension: Secondary | ICD-10-CM

## 2012-05-10 DIAGNOSIS — D649 Anemia, unspecified: Secondary | ICD-10-CM

## 2012-05-10 DIAGNOSIS — I428 Other cardiomyopathies: Secondary | ICD-10-CM

## 2012-05-10 DIAGNOSIS — N189 Chronic kidney disease, unspecified: Secondary | ICD-10-CM

## 2012-05-10 DIAGNOSIS — I2699 Other pulmonary embolism without acute cor pulmonale: Secondary | ICD-10-CM

## 2012-05-10 DIAGNOSIS — B952 Enterococcus as the cause of diseases classified elsewhere: Secondary | ICD-10-CM

## 2012-05-10 DIAGNOSIS — E119 Type 2 diabetes mellitus without complications: Secondary | ICD-10-CM

## 2012-05-10 NOTE — Progress Notes (Deleted)
Name: [name]    DOB: Jan 20, 1946  Age: 66 y.o.  MR#: [id number]       PCP:  Milana Obey, MD      Insurance: @PAYORNAME @   CC:    Chief Complaint  Patient presents with  . Cardiomyopathy   MEDICATION BOTTLES REVIEWED VS BP 132/70  Pulse 81  Ht 5\' 5"  (1.651 m)  Wt 184 lb (83.462 kg)  BMI 30.62 kg/m2  SpO2 98%  Weights Current Weight  05/10/12 184 lb (83.462 kg)  07/14/11 186 lb (84.369 kg)  01/14/11 190 lb (86.183 kg)    Blood Pressure  BP Readings from Last 3 Encounters:  05/10/12 132/70  07/14/11 82/44  01/14/11 138/65     Admit date:  (Not on file) Last encounter with RMR:  05/07/2012   Allergy No Known Allergies  Current Outpatient Prescriptions  Medication Sig Dispense Refill  . carvedilol (COREG) 25 MG tablet Take 25 mg by mouth 2 (two) times daily.        . diphenhydramine-acetaminophen (TYLENOL PM EXTRA STRENGTH) 25-500 MG TABS Take 1 tablet by mouth at bedtime as needed.        . furosemide (LASIX) 80 MG tablet Take 80 mg by mouth. 1 tab am. 1/2 tab pm       . glucose blood test strip 1 each by Other route daily. Use as instructed       . HYDROcodone-acetaminophen (LORCET 10/650) 10-650 MG per tablet Take 1 tablet by mouth 3 (three) times daily as needed. For pain       . Lancets MISC by Does not apply route daily.        Marland Kitchen lisinopril (PRINIVIL,ZESTRIL) 10 MG tablet Take 1 tablet (10 mg total) by mouth daily.  90 tablet  3  . metFORMIN (GLUCOPHAGE) 1000 MG tablet Take 1,000 mg by mouth 2 (two) times daily.        . pravastatin (PRAVACHOL) 40 MG tablet Take 80 mg by mouth daily.          Discontinued Meds:    Medications Discontinued During This Encounter  Medication Reason  . polyethylene glycol powder (GLYCOLAX/MIRALAX) powder Error  . glimepiride (AMARYL) 4 MG tablet Discontinued by provider  . oxyCODONE-acetaminophen (PERCOCET) 10-325 MG per tablet Discontinued by provider  . spironolactone (ALDACTONE) 25 MG tablet Discontinued by provider      Patient Active Problem List  Diagnosis  . HYPERLIPIDEMIA  . HYPERTENSION  . LBBB  . URINARY INCONTINENCE  . Cardiomyopathy, nonischemic  . Enterococcal infection  . Pulmonary embolism  . Diabetes mellitus, type 2  . GERD (gastroesophageal reflux disease)  . Tobacco abuse  . Anemia  . Obstructive sleep apnea  . Degenerative joint disease  . Chronic kidney disease  . Villous adenoma of colon    LABS No visits with results within 3 Month(s) from this visit. Latest known visit with results is:  Orders Only on 07/25/2011  Component Date Value  . Card #1 Date 07/25/2011 Negative   . Card #2 Date 07/25/2011 Negative   . Card #3 Date 07/25/2011 Negative      Results for this Opt Visit:     Results for orders placed in visit on 07/25/11  HEMOCCULT (HOME) BLOOD/STOOL TESTS (HOME-3 CARDS)      Component Value Range   Card #1 Date Negative     Fecal Occult Blood, POC       Card #2 Date Negative     Card #2 Fecal Occult  Blod, POC       Card #3 Date Negative     Card #3 Fecal Occult Blood, POC        EKG Orders placed in visit on 07/14/11  . EKG 12-LEAD     Prior Assessment and Plan Problem List as of 05/10/2012            Cardiology Problems   HYPERLIPIDEMIA   Last Assessment & Plan Note   07/14/2011 Office Visit Signed 07/14/2011 12:59 PM by Kathlen Brunswick, MD    Most recent lipid profile in 11/2010 showed very adequate values, especially in light of the absence of known vascular disease.    HYPERTENSION   Last Assessment & Plan Note   07/14/2011 Office Visit Signed 07/14/2011  1:00 PM by Kathlen Brunswick, MD    Adequately controlled with medications primarily utilized to treat cardiomyopathy    LBBB   Cardiomyopathy, nonischemic   Last Assessment & Plan Note   07/14/2011 Office Visit Addendum 07/18/2011  7:11 PM by Kathlen Brunswick, MD    Patient continues to do amazingly well symptomatically despite very severe left ventricular dysfunction, which has  persisted for the past 14 years.  Due to relative hypotension, albeit asymptomatic, her dose of lisinopril will be reduced to 10 mg per day.  She will follow blood pressures at home and call for very low values or for symptoms related to hypotension.    Pulmonary embolism   Last Assessment & Plan Note   01/14/2011 Office Visit Signed 01/14/2011  8:17 PM by Kathlen Brunswick, MD    She continues to remain free of thromboembolic disease without requiring full anticoagulation.      Other   URINARY INCONTINENCE   Enterococcal infection   Diabetes mellitus, type 2   Last Assessment & Plan Note   07/14/2011 Office Visit Addendum 07/18/2011  7:12 PM by Kathlen Brunswick, MD    Multiple fasting glucose values measured over the past year generally less than 150 except for a value of 177 in 09/2010. CBGs at home are all less than 150 and typically 120-130    GERD (gastroesophageal reflux disease)   Tobacco abuse   Last Assessment & Plan Note   07/14/2011 Office Visit Signed 07/14/2011  1:01 PM by Kathlen Brunswick, MD    Patient reports minimal cigarette smoking since 2010.  She is encouraged to avoid tobacco use entirely.    Anemia   Last Assessment & Plan Note   07/14/2011 Office Visit Signed 07/14/2011 12:56 PM by Kathlen Brunswick, MD    Anemia persists, at least as of the most recent measurement 7 months ago.  Basic studies including serum iron, iron-binding, ferritin, B12, folate and stool for Hemoccult testing will be obtained.    Obstructive sleep apnea   Degenerative joint disease   Last Assessment & Plan Note   07/14/2011 Office Visit Addendum 07/18/2011  7:12 PM by Kathlen Brunswick, MD    Degenerative joint disease of the hip continues to significantly impair her quality of life, but [name] appears to have come to an accommodation with her disease.  She uses a walker with adequate mobility and does not wish to reconsider orthopaedic surgery.  No falls have occurred for at least the past 6  months.    Chronic kidney disease   Last Assessment & Plan Note   07/14/2011 Office Visit Signed 07/14/2011 12:57 PM by Kathlen Brunswick, MD    Stable renal insufficiency, at  least since 2009.  Hopefully, she will continue to avoid progression of chronic kidney disease.    Villous adenoma of colon       Imaging: No results found.   FRS Calculation: Score not calculated. Missing: Total Cholesterol

## 2012-05-10 NOTE — Assessment & Plan Note (Signed)
Patient reports CBGs less than 150, but is unaware of any recent A1c determination.  Most recent value available to me was 6.8 in 11/2010, indicating good control at that time.

## 2012-05-10 NOTE — Assessment & Plan Note (Signed)
Renal function has improved since 2009 and is now near normal.

## 2012-05-10 NOTE — Assessment & Plan Note (Signed)
No evidence for iron deficiency, occult blood loss or folate deficiency; B12 is low normal.  Stable minimal anemia does not appear to require further evaluation at this time.  CBC will be repeated.

## 2012-05-10 NOTE — Assessment & Plan Note (Signed)
Stable cardiomyopathy with compensated congestive heart failure.  Current medications will be continued.

## 2012-05-10 NOTE — Assessment & Plan Note (Signed)
Blood pressure is improved from previous borderline hypotensive levels.  Current medications will be continued.

## 2012-05-10 NOTE — Patient Instructions (Addendum)
Your physician recommends that you schedule a follow-up appointment in: 6 months  

## 2012-05-10 NOTE — Progress Notes (Signed)
Patient ID: LISEL SIEGRIST, female   DOB: 04-14-1946, 66 y.o.   MRN: [id number]  HPI: Scheduled return visit for this lovely woman with nonischemic cardiomyopathy.  Since her last visit, she has done quite well, avoiding the need for urgent medical care and developing no new medical problems.  Class III dyspnea on exertion is unchanged.  She has experienced no chest discomfort, palpitations or syncope.  Prior to Admission medications   Medication Sig Start Date End Date Taking? Authorizing Provider  carvedilol (COREG) 25 MG tablet Take 25 mg by mouth 2 (two) times daily.     Yes Historical Provider, MD  diphenhydramine-acetaminophen (TYLENOL PM EXTRA STRENGTH) 25-500 MG TABS Take 1 tablet by mouth at bedtime as needed.     Yes Historical Provider, MD  furosemide (LASIX) 80 MG tablet Take 80 mg by mouth. 1 tab am. 1/2 tab pm    Yes Historical Provider, MD  glucose blood test strip 1 each by Other route daily. Use as instructed    Yes Historical Provider, MD  HYDROcodone-acetaminophen (LORCET 10/650) 10-650 MG per tablet Take 1 tablet by mouth 3 (three) times daily as needed. For pain    Yes Historical Provider, MD  Lancets MISC by Does not apply route daily.     Yes Historical Provider, MD  lisinopril (PRINIVIL,ZESTRIL) 10 MG tablet Take 1 tablet (10 mg total) by mouth daily. 07/14/11 07/13/12 Yes Kathlen Brunswick, MD  metFORMIN (GLUCOPHAGE) 1000 MG tablet Take 1,000 mg by mouth 2 (two) times daily.     Yes Historical Provider, MD  pravastatin (PRAVACHOL) 40 MG tablet Take 80 mg by mouth daily.     Yes Historical Provider, MD    No Known Allergies    Past medical history, social history, and family history reviewed and updated.  ROS: Notes general malaise, easy fatigability and chronic hip pain.  Denies orthopnea, PND, pedal edema, weight gain.  All other systems reviewed and are negative.  PHYSICAL EXAM: BP 132/70  Pulse 81  Ht 5\' 5"  (1.651 m)  Wt 83.462 kg (184 lb)  BMI 30.62 kg/m2  SpO2  98%  General-Well developed; no acute distress Body habitus-Mildly overweight Neck-No JVD; no carotid bruits; no HJR Lungs-clear lung fields; resonant to percussion Cardiovascular-normal PMI; normal S1 and S2; modest systolic murmur at the left sternal border Abdomen-normal bowel sounds; soft and non-tender without masses or organomegaly Musculoskeletal-No deformities, no cyanosis or clubbing Neurologic-Normal cranial nerves; symmetric strength and tone Skin-Warm, no significant lesions Extremities-distal pulses intact; no edema  ASSESSMENT AND PLAN:  [place], MD 05/10/2012 11:33 AM

## 2012-05-13 ENCOUNTER — Encounter: Payer: Self-pay | Admitting: Cardiology

## 2012-05-30 ENCOUNTER — Encounter: Payer: Self-pay | Admitting: Cardiology

## 2012-07-26 ENCOUNTER — Other Ambulatory Visit: Payer: Self-pay | Admitting: Cardiology

## 2012-08-02 ENCOUNTER — Inpatient Hospital Stay (HOSPITAL_COMMUNITY)
Admission: EM | Admit: 2012-08-02 | Discharge: 2012-08-09 | DRG: 309 | Disposition: A | Discharge status: Home / Self Care | Payer: Medicare Other | Attending: Family Medicine | Admitting: Family Medicine

## 2012-08-02 ENCOUNTER — Encounter (HOSPITAL_COMMUNITY): Payer: Self-pay | Admitting: *Deleted

## 2012-08-02 ENCOUNTER — Emergency Department (HOSPITAL_COMMUNITY): Payer: Medicare Other

## 2012-08-02 DIAGNOSIS — Z96649 Presence of unspecified artificial hip joint: Secondary | ICD-10-CM

## 2012-08-02 DIAGNOSIS — Z79899 Other long term (current) drug therapy: Secondary | ICD-10-CM

## 2012-08-02 DIAGNOSIS — IMO0002 Reserved for concepts with insufficient information to code with codable children: Secondary | ICD-10-CM | POA: Diagnosis present

## 2012-08-02 DIAGNOSIS — Z86711 Personal history of pulmonary embolism: Secondary | ICD-10-CM

## 2012-08-02 DIAGNOSIS — R0789 Other chest pain: Secondary | ICD-10-CM | POA: Diagnosis present

## 2012-08-02 DIAGNOSIS — I509 Heart failure, unspecified: Secondary | ICD-10-CM | POA: Diagnosis present

## 2012-08-02 DIAGNOSIS — I4819 Other persistent atrial fibrillation: Secondary | ICD-10-CM | POA: Diagnosis present

## 2012-08-02 DIAGNOSIS — N390 Urinary tract infection, site not specified: Secondary | ICD-10-CM | POA: Diagnosis present

## 2012-08-02 DIAGNOSIS — I428 Other cardiomyopathies: Secondary | ICD-10-CM | POA: Diagnosis present

## 2012-08-02 DIAGNOSIS — E669 Obesity, unspecified: Secondary | ICD-10-CM | POA: Diagnosis present

## 2012-08-02 DIAGNOSIS — M1610 Unilateral primary osteoarthritis, unspecified hip: Secondary | ICD-10-CM | POA: Diagnosis present

## 2012-08-02 DIAGNOSIS — G4733 Obstructive sleep apnea (adult) (pediatric): Secondary | ICD-10-CM | POA: Diagnosis present

## 2012-08-02 DIAGNOSIS — R079 Chest pain, unspecified: Secondary | ICD-10-CM

## 2012-08-02 DIAGNOSIS — D649 Anemia, unspecified: Secondary | ICD-10-CM | POA: Diagnosis present

## 2012-08-02 DIAGNOSIS — R0602 Shortness of breath: Secondary | ICD-10-CM

## 2012-08-02 DIAGNOSIS — M19019 Primary osteoarthritis, unspecified shoulder: Secondary | ICD-10-CM | POA: Diagnosis present

## 2012-08-02 DIAGNOSIS — E119 Type 2 diabetes mellitus without complications: Secondary | ICD-10-CM

## 2012-08-02 DIAGNOSIS — I129 Hypertensive chronic kidney disease with stage 1 through stage 4 chronic kidney disease, or unspecified chronic kidney disease: Secondary | ICD-10-CM | POA: Diagnosis present

## 2012-08-02 DIAGNOSIS — F17200 Nicotine dependence, unspecified, uncomplicated: Secondary | ICD-10-CM | POA: Diagnosis present

## 2012-08-02 DIAGNOSIS — I5022 Chronic systolic (congestive) heart failure: Secondary | ICD-10-CM | POA: Diagnosis present

## 2012-08-02 DIAGNOSIS — E785 Hyperlipidemia, unspecified: Secondary | ICD-10-CM | POA: Diagnosis present

## 2012-08-02 DIAGNOSIS — Z8701 Personal history of pneumonia (recurrent): Secondary | ICD-10-CM

## 2012-08-02 DIAGNOSIS — A498 Other bacterial infections of unspecified site: Secondary | ICD-10-CM | POA: Diagnosis present

## 2012-08-02 DIAGNOSIS — K219 Gastro-esophageal reflux disease without esophagitis: Secondary | ICD-10-CM | POA: Diagnosis present

## 2012-08-02 DIAGNOSIS — E1129 Type 2 diabetes mellitus with other diabetic kidney complication: Secondary | ICD-10-CM | POA: Diagnosis present

## 2012-08-02 DIAGNOSIS — N189 Chronic kidney disease, unspecified: Secondary | ICD-10-CM | POA: Diagnosis present

## 2012-08-02 DIAGNOSIS — M169 Osteoarthritis of hip, unspecified: Secondary | ICD-10-CM | POA: Diagnosis present

## 2012-08-02 DIAGNOSIS — I4891 Unspecified atrial fibrillation: Principal | ICD-10-CM | POA: Diagnosis present

## 2012-08-02 DIAGNOSIS — K649 Unspecified hemorrhoids: Secondary | ICD-10-CM | POA: Diagnosis present

## 2012-08-02 DIAGNOSIS — I5042 Chronic combined systolic (congestive) and diastolic (congestive) heart failure: Secondary | ICD-10-CM | POA: Diagnosis present

## 2012-08-02 DIAGNOSIS — I10 Essential (primary) hypertension: Secondary | ICD-10-CM | POA: Diagnosis present

## 2012-08-02 DIAGNOSIS — M1710 Unilateral primary osteoarthritis, unspecified knee: Secondary | ICD-10-CM | POA: Diagnosis present

## 2012-08-02 DIAGNOSIS — M1990 Unspecified osteoarthritis, unspecified site: Secondary | ICD-10-CM | POA: Diagnosis present

## 2012-08-02 DIAGNOSIS — I447 Left bundle-branch block, unspecified: Secondary | ICD-10-CM | POA: Diagnosis present

## 2012-08-02 LAB — CBC
HCT: 36.7 % (ref 36.0–46.0)
MCH: 31.2 pg (ref 26.0–34.0)
MCHC: 33.5 g/dL (ref 30.0–36.0)
MCV: 93.1 fL (ref 78.0–100.0)
Platelets: 268 10*3/uL (ref 150–400)
RDW: 12.5 % (ref 11.5–15.5)

## 2012-08-02 LAB — BASIC METABOLIC PANEL
BUN: 31 mg/dL — ABNORMAL HIGH (ref 6–23)
Calcium: 10.6 mg/dL — ABNORMAL HIGH (ref 8.4–10.5)
Creatinine, Ser: 1.33 mg/dL — ABNORMAL HIGH (ref 0.50–1.10)
GFR calc Af Amer: 46 mL/min — ABNORMAL LOW (ref >90)

## 2012-08-02 LAB — PRO B NATRIURETIC PEPTIDE: Pro B Natriuretic peptide (BNP): 27771 pg/mL — ABNORMAL HIGH (ref 0–125)

## 2012-08-02 MED ORDER — DILTIAZEM HCL 100 MG IV SOLR
5.0000 mg/h | INTRAVENOUS | Status: DC
  Administered 2012-08-02 – 2012-08-03 (×2): 5 mg/h via INTRAVENOUS
  Administered 2012-08-03: 10 mg/h via INTRAVENOUS
  Filled 2012-08-02: qty 100

## 2012-08-02 MED ORDER — DILTIAZEM HCL 50 MG/10ML IV SOLN: 10.0000 mg | Freq: Once | INTRAVENOUS | Status: DC

## 2012-08-02 MED ORDER — DILTIAZEM LOAD VIA INFUSION
10.0000 mg | Freq: Once | INTRAVENOUS | Status: AC
  Administered 2012-08-02: 10 mg via INTRAVENOUS
  Filled 2012-08-02: qty 10

## 2012-08-02 MED ORDER — NITROGLYCERIN 0.4 MG SL SUBL
0.4000 mg | SUBLINGUAL_TABLET | SUBLINGUAL | Status: DC | PRN
  Administered 2012-08-02: 0.4 mg via SUBLINGUAL
  Filled 2012-08-02: qty 25

## 2012-08-02 MED ORDER — ASPIRIN 325 MG PO TABS
325.0000 mg | ORAL_TABLET | Freq: Once | ORAL | Status: AC
  Administered 2012-08-02: 325 mg via ORAL
  Filled 2012-08-02: qty 1

## 2012-08-02 NOTE — ED Provider Notes (Signed)
This chart was scribed for Ward Givens, MD by Charolett Bumpers, ED Scribe. The patient was seen in room [facility]14[facility]. Patient's care was started at 2213.  Pt was initially seen by resident.  [name] is a 67 y.o. female who presents to the Emergency Department complaining of gradually worsening chest pain, nausea and SOB for the past 3 days. She describes the chest pain as tightness. She reports mild abdominal pain with palpation. She denies any leg swelling, cough, known fevers. She reports some light-headedness with walking but denies any weakness.  PCP Dr [name] Cardiologist Dr Dietrich Pates  Upon examination: No pitting edema. Heart rate is iregular, tachycardia. Non-tender abdomen.   I have reviewed the EKG with the resident and agree with interpretation   I personally performed the services described in this documentation, which was scribed in my presence. The recorded information has been reviewed and considered.  Devoria Albe, MD, Armando Gang      Ward Givens, MD 08/02/12 [phone]

## 2012-08-02 NOTE — ED Notes (Signed)
Chest pain, sob for 3 days

## 2012-08-02 NOTE — ED Provider Notes (Signed)
History     CSN: [id number]  Arrival date & time 08/02/12  2120   First MD Initiated Contact with Patient 08/02/12 2135      Chief Complaint  Patient presents with  . Chest Pain    (Consider location/radiation/quality/duration/timing/severity/associated sxs/prior treatment) Patient is a 67 y.o. female presenting with chest pain. The history is provided by the patient.  Chest Pain Pain location:  Substernal area Pain quality: pressure   Pain radiates to:  Does not radiate Pain radiates to the back: no   Pain severity:  Severe Onset quality:  Sudden Duration:  3 days Timing:  Constant Progression:  Unchanged Context comment:  Spontaneously Relieved by:  Nothing Worsened by:  Nothing tried Associated symptoms: shortness of breath   Associated symptoms: no abdominal pain, no cough, no dizziness, no fever, no syncope and not vomiting     Past Medical History  Diagnosis Date  . Cardiomyopathy, nonischemic 10/07    EF 10% in 03/2006, 35% in 10/08 and 10-15% in 10/09 normal coronary angiography in 1999  . 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 Pulmonary embolism 2/07    after total right hip arthroplasty  . Hilar density     infrahilar mass/adenopathy on CT scan 5/07; subsequently  resolved  . Diabetes mellitus, type 2   . LBBB (left bundle branch block)   . GERD (gastroesophageal reflux disease)   . Hyperlipidemia   . Hypertension   . Tobacco abuse     discontinued in 1997, and then resumed  . Urinary incontinence   . Pneumonia     h/o pleural effusion  . Anemia     mild and chronic  . Obstructive sleep apnea     mild-did not tolerate CPAP  . Degenerative joint disease     of knees, shoulder, and hips  . 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 Chronic kidney disease     creatinin-1.44 in 1/09; 1.51 in 1/10    Past Surgical History  Procedure Laterality Date  .  Pacemaker removal  11/18/05    Enterococcal infection  . Total hip arthroplasty  2/07    Right  . Abdominal hysterectomy  1990/92    Initial partial hysterectomy followed by BSO  . Knee arthroscopy      Remote  . Colonoscopy w/ polypectomy  2009  . A-v cardiac pacemaker insertion  12/05    Biventricular pacemaker/AICD    Family History  Problem Relation Age of Onset  . Hypertension Mother   . Diabetes Mother   . Coronary artery disease Father   . Diabetes Brother   . Hypertension Brother   . Lung cancer Brother   . Arthritis Other   . Diabetes Other   . Heart disease Other     female < 55    History  Substance Use Topics  . Smoking status: Current Every Day Smoker    Types: Cigarettes  . Smokeless tobacco: Never Used     Comment: 1 pack per month   . Alcohol Use: No    OB History   Grav Para Term Preterm Abortions TAB SAB Ect Mult Living                  Review of Systems  Constitutional: Negative for fever.  Respiratory: Positive for shortness of breath. Negative for cough.   Cardiovascular: Positive for chest pain. Negative for syncope.  Gastrointestinal: Negative for vomiting and abdominal pain.  Neurological: Negative for dizziness.  All other systems reviewed and are negative.    Allergies  Review of patient's allergies indicates no known allergies.  Home Medications   Current Outpatient Rx  Name  Route  Sig  Dispense  Refill  . carvedilol (COREG) 25 MG tablet   Oral   Take 25 mg by mouth 2 (two) times daily.           . diphenhydramine-acetaminophen (TYLENOL PM EXTRA STRENGTH) 25-500 MG TABS   Oral   Take 1 tablet by mouth at bedtime as needed (for sleep).          . furosemide (LASIX) 80 MG tablet   Oral   Take 40-80 mg by mouth 2 (two) times daily. 1 tab am. 1/2 tab pm         . HYDROcodone-acetaminophen (NORCO) 10-325 MG per tablet   Oral   Take 1 tablet by mouth 3 (three) times daily as needed for pain (for pain).         Marland Kitchen  lisinopril (PRINIVIL,ZESTRIL) 10 MG tablet   Oral   Take 10 mg by mouth daily.         . metFORMIN (GLUCOPHAGE) 1000 MG tablet   Oral   Take 1,000 mg by mouth 2 (two) times daily.           . polyethylene glycol powder (GLYCOLAX/MIRALAX) powder               . pravastatin (PRAVACHOL) 40 MG tablet   Oral   Take 40 mg by mouth 2 (two) times daily.            BP 120/93  Pulse 123  Temp(Src) 98 F (36.7 C) (Oral)  Resp 22  Ht 5\' 5"  (1.651 m)  Wt 180 lb (81.647 kg)  BMI 29.95 kg/m2  SpO2 100%  Physical Exam  Nursing note and vitals reviewed. Constitutional: She is oriented to person, place, and time. She appears well-developed and well-nourished. No distress.  HENT:  Head: Normocephalic and atraumatic.  Eyes: EOM are normal. Pupils are equal, round, and reactive to light.  Neck: Normal range of motion. Neck supple.  Cardiovascular: An irregularly irregular rhythm present. Tachycardia present.  Exam reveals no friction rub.   No murmur heard. Pulmonary/Chest: Effort normal and breath sounds normal. No respiratory distress. She has no wheezes. She has no rales.  Abdominal: Soft. She exhibits no distension. There is tenderness (mild, diffuse). There is no rebound.  Musculoskeletal: Normal range of motion. She exhibits no edema.  Neurological: She is alert and oriented to person, place, and time.  Skin: She is not diaphoretic.    ED Course  Procedures (including critical care time)  Labs Reviewed  BASIC METABOLIC PANEL - Abnormal; Notable for the following:    Glucose, Bld 235 (*)    BUN 31 (*)    Creatinine, Ser 1.33 (*)    Calcium 10.6 (*)    GFR calc non Af Amer 40 (*)    GFR calc Af Amer 46 (*)    All other components within normal limits  PRO B NATRIURETIC PEPTIDE - Abnormal; Notable for the following:    Pro B Natriuretic peptide (BNP) 27771.0 (*)    All other components within normal limits  CBC  TROPONIN I  PROTIME-INR  URINALYSIS, ROUTINE W REFLEX  MICROSCOPIC   Dg Chest Portable 1 View  08/02/2012  *RADIOLOGY REPORT*  Clinical Data: Chest pain.  PORTABLE CHEST - 1 VIEW  Comparison: Chest radiograph performed 04/11/2008  Findings: The lungs are well-aerated.  Mild bibasilar opacities may reflect atelectasis or scarring.  There is no evidence of pleural effusion or pneumothorax.  The cardiomediastinal silhouette is mildly enlarged.  No acute osseous abnormalities are seen.  Degenerative change is noted at the left glenohumeral joint.  IMPRESSION: Mild bibasilar opacities may reflect atelectasis or scarring; mild cardiomegaly again noted.   Original Report Authenticated By: Tonia Ghent, M.D.      1. Atrial fibrillation with RVR   2. Chest pain   3. SOB (shortness of breath)      Date: 08/02/2012  Rate: 138  Rhythm: atrial fibrillation and with RVR  QRS Axis: left  Intervals: widened QRS  ST/T Wave abnormalities: normal  Conduction Disutrbances:left bundle branch block  Narrative Interpretation:   Old EKG Reviewed: no previous history of Afib. Known LBBB    MDM   Patient is a 67 year old female with history of nonischemic cardiomyopathy with last EF around 50%, PE, diabetes, hypertension, previous left bundle branch block who presents with chest pain. Present for 3 days. Associated shortness of breath. Denies fever, cough. Called her family doctor who instructed her to come to the hospital tonight. Patient here is tachycardic, normotensive. Her EKG shows A. fib with RVR with her nodal bundle branch block. She has clear lungs with no rales. She has mild abdominal tenderness. Her A. fib appears new. We'll slow her down with diltiazem bolus and drip. We'll treat her chest pain with aspirin and nitroglycerin. I will check basic labs including a chest x-ray. Diltiazem bolus and drip initiated with resolution of her RVR. Pain free once not in Afib.  Patient's troponin normal, BNP elevated. Will admit to hospitalist to Step Down  Unit.       Elwin Mocha, MD 08/02/12 [phone]

## 2012-08-03 ENCOUNTER — Encounter (HOSPITAL_COMMUNITY): Payer: Self-pay

## 2012-08-03 DIAGNOSIS — E119 Type 2 diabetes mellitus without complications: Secondary | ICD-10-CM

## 2012-08-03 DIAGNOSIS — I4891 Unspecified atrial fibrillation: Principal | ICD-10-CM

## 2012-08-03 DIAGNOSIS — I4819 Other persistent atrial fibrillation: Secondary | ICD-10-CM | POA: Diagnosis present

## 2012-08-03 DIAGNOSIS — R0789 Other chest pain: Secondary | ICD-10-CM | POA: Diagnosis present

## 2012-08-03 DIAGNOSIS — I428 Other cardiomyopathies: Secondary | ICD-10-CM

## 2012-08-03 DIAGNOSIS — I059 Rheumatic mitral valve disease, unspecified: Secondary | ICD-10-CM

## 2012-08-03 DIAGNOSIS — I5042 Chronic combined systolic (congestive) and diastolic (congestive) heart failure: Secondary | ICD-10-CM | POA: Diagnosis present

## 2012-08-03 LAB — HEPATIC FUNCTION PANEL
Albumin: 4.1 g/dL (ref 3.5–5.2)
Total Bilirubin: 0.9 mg/dL (ref 0.3–1.2)

## 2012-08-03 LAB — CBC
Platelets: 221 10*3/uL (ref 150–400)
RDW: 12.6 % (ref 11.5–15.5)
WBC: 8.7 10*3/uL (ref 4.0–10.5)

## 2012-08-03 LAB — BASIC METABOLIC PANEL
Calcium: 10.2 mg/dL (ref 8.4–10.5)
Chloride: 105 mEq/L (ref 96–112)
Creatinine, Ser: 1.2 mg/dL — ABNORMAL HIGH (ref 0.50–1.10)
GFR calc Af Amer: 53 mL/min — ABNORMAL LOW (ref >90)

## 2012-08-03 LAB — MAGNESIUM: Magnesium: 2 mg/dL (ref 1.5–2.5)

## 2012-08-03 LAB — GLUCOSE, CAPILLARY: Glucose-Capillary: 183 mg/dL — ABNORMAL HIGH (ref 70–99)

## 2012-08-03 LAB — TSH: TSH: 1.388 u[IU]/mL (ref 0.350–4.500)

## 2012-08-03 LAB — TROPONIN I: Troponin I: <0.3 ng/mL (ref <0.30)

## 2012-08-03 LAB — HEPARIN LEVEL (UNFRACTIONATED)
Heparin Unfractionated: 0.23 IU/mL — ABNORMAL LOW (ref 0.30–0.70)
Heparin Unfractionated: 0.3 IU/mL (ref 0.30–0.70)

## 2012-08-03 LAB — HEMOGLOBIN A1C
Hgb A1c MFr Bld: 7.4 % — ABNORMAL HIGH (ref <5.7)
Mean Plasma Glucose: 166 mg/dL — ABNORMAL HIGH (ref <117)

## 2012-08-03 MED ORDER — INSULIN ASPART 100 UNIT/ML ~~LOC~~ SOLN
0.0000 [IU] | Freq: Three times a day (TID) | SUBCUTANEOUS | Status: DC
  Administered 2012-08-03: 1 [IU] via SUBCUTANEOUS
  Administered 2012-08-03 – 2012-08-04 (×3): 2 [IU] via SUBCUTANEOUS
  Administered 2012-08-05 (×2): 1 [IU] via SUBCUTANEOUS
  Administered 2012-08-06: 2 [IU] via SUBCUTANEOUS
  Administered 2012-08-06 – 2012-08-07 (×3): 1 [IU] via SUBCUTANEOUS
  Administered 2012-08-07: 2 [IU] via SUBCUTANEOUS
  Administered 2012-08-08 (×2): 1 [IU] via SUBCUTANEOUS

## 2012-08-03 MED ORDER — ACETAMINOPHEN 650 MG RE SUPP: 650.0000 mg | Freq: Four times a day (QID) | RECTAL | Status: DC | PRN

## 2012-08-03 MED ORDER — SORBITOL 70 % SOLN: 30.0000 mL | Freq: Every day | Status: DC | PRN

## 2012-08-03 MED ORDER — INSULIN ASPART 100 UNIT/ML ~~LOC~~ SOLN: 0.0000 [IU] | Freq: Every day | SUBCUTANEOUS | Status: DC

## 2012-08-03 MED ORDER — SPIRONOLACTONE 25 MG PO TABS
25.0000 mg | ORAL_TABLET | Freq: Every day | ORAL | Status: DC
  Administered 2012-08-03 – 2012-08-09 (×7): 25 mg via ORAL
  Filled 2012-08-03 (×7): qty 1

## 2012-08-03 MED ORDER — FUROSEMIDE 80 MG PO TABS
80.0000 mg | ORAL_TABLET | Freq: Two times a day (BID) | ORAL | Status: DC
  Administered 2012-08-03 – 2012-08-04 (×3): 80 mg via ORAL
  Filled 2012-08-03 (×3): qty 1

## 2012-08-03 MED ORDER — POTASSIUM CHLORIDE CRYS ER 20 MEQ PO TBCR
20.0000 meq | EXTENDED_RELEASE_TABLET | Freq: Two times a day (BID) | ORAL | Status: DC
  Administered 2012-08-03 – 2012-08-09 (×13): 20 meq via ORAL
  Filled 2012-08-03 (×13): qty 1

## 2012-08-03 MED ORDER — CARVEDILOL 12.5 MG PO TABS
25.0000 mg | ORAL_TABLET | Freq: Two times a day (BID) | ORAL | Status: DC
  Administered 2012-08-03 – 2012-08-06 (×7): 25 mg via ORAL
  Filled 2012-08-03 (×7): qty 2

## 2012-08-03 MED ORDER — METFORMIN HCL 500 MG PO TABS
1000.0000 mg | ORAL_TABLET | Freq: Two times a day (BID) | ORAL | Status: DC
  Administered 2012-08-03 – 2012-08-09 (×13): 1000 mg via ORAL
  Filled 2012-08-03 (×13): qty 2

## 2012-08-03 MED ORDER — ACETAMINOPHEN 325 MG PO TABS: 650.0000 mg | ORAL_TABLET | Freq: Four times a day (QID) | ORAL | Status: DC | PRN

## 2012-08-03 MED ORDER — OXYCODONE HCL 5 MG PO TABS
5.0000 mg | ORAL_TABLET | ORAL | Status: DC | PRN
  Administered 2012-08-04 – 2012-08-08 (×8): 5 mg via ORAL
  Filled 2012-08-03 (×8): qty 1

## 2012-08-03 MED ORDER — LISINOPRIL 10 MG PO TABS
10.0000 mg | ORAL_TABLET | Freq: Every day | ORAL | Status: DC
  Administered 2012-08-03: 10 mg via ORAL
  Filled 2012-08-03 (×2): qty 1

## 2012-08-03 MED ORDER — HEPARIN BOLUS VIA INFUSION
4000.0000 [IU] | Freq: Once | INTRAVENOUS | Status: AC
  Administered 2012-08-03: 4000 [IU] via INTRAVENOUS
  Filled 2012-08-03: qty 4000

## 2012-08-03 MED ORDER — HEPARIN (PORCINE) IN NACL 100-0.45 UNIT/ML-% IJ SOLN
INTRAMUSCULAR | Status: AC
  Filled 2012-08-03: qty 250

## 2012-08-03 MED ORDER — HEPARIN (PORCINE) IN NACL 100-0.45 UNIT/ML-% IJ SOLN
1400.0000 [IU]/h | INTRAMUSCULAR | Status: DC
  Administered 2012-08-03: 1250 [IU]/h via INTRAVENOUS
  Administered 2012-08-03: 1000 [IU]/h via INTRAVENOUS
  Filled 2012-08-03: qty 250

## 2012-08-03 MED ORDER — ONDANSETRON HCL 4 MG PO TABS: 4.0000 mg | ORAL_TABLET | Freq: Four times a day (QID) | ORAL | Status: DC | PRN

## 2012-08-03 MED ORDER — FUROSEMIDE 10 MG/ML IJ SOLN
40.0000 mg | Freq: Once | INTRAMUSCULAR | Status: AC
  Administered 2012-08-03: 40 mg via INTRAVENOUS
  Filled 2012-08-03: qty 4

## 2012-08-03 MED ORDER — MORPHINE SULFATE 2 MG/ML IJ SOLN: 1.0000 mg | INTRAMUSCULAR | Status: DC | PRN

## 2012-08-03 MED ORDER — ONDANSETRON HCL 4 MG/2ML IJ SOLN: 4.0000 mg | Freq: Four times a day (QID) | INTRAMUSCULAR | Status: DC | PRN

## 2012-08-03 MED ORDER — OFF THE BEAT BOOK
Freq: Once | Status: DC
  Filled 2012-08-03: qty 1

## 2012-08-03 MED ORDER — TRAZODONE HCL 50 MG PO TABS
25.0000 mg | ORAL_TABLET | Freq: Every evening | ORAL | Status: DC | PRN
  Administered 2012-08-04 – 2012-08-08 (×5): 25 mg via ORAL
  Filled 2012-08-03 (×5): qty 1

## 2012-08-03 MED ORDER — POTASSIUM CHLORIDE CRYS ER 20 MEQ PO TBCR
40.0000 meq | EXTENDED_RELEASE_TABLET | Freq: Once | ORAL | Status: AC
  Administered 2012-08-03: 40 meq via ORAL
  Filled 2012-08-03: qty 2

## 2012-08-03 MED ORDER — SIMVASTATIN 20 MG PO TABS
40.0000 mg | ORAL_TABLET | Freq: Every day | ORAL | Status: DC
  Administered 2012-08-03: 40 mg via ORAL
  Filled 2012-08-03: qty 2

## 2012-08-03 NOTE — Progress Notes (Signed)
[name], JESTER NO.:  [id number]  MEDICAL RECORD NO.:  [id number]  LOCATION:  IC01                          FACILITY:  [facility]  PHYSICIAN:  Mila Homer. [name], M.D.DATE OF BIRTH:  1946-02-17  DATE OF PROCEDURE: DATE OF DISCHARGE:                                PROGRESS NOTE   SUBJECTIVE:  This 67 year old presented to the [facility] ER last night in atrial fibrillation, rapid ventricular response.  Her rate was 140.  She is now in the ICU, feels much better.  OBJECTIVE:  VITAL SIGNS:  Temperature is 97.8, pulse 59, respiratory rate is 23, blood pressure 124/72, O2 saturation is 93%. GENERAL:  Color is good.  Her speech is normal.  Her sensorium is intact. HEART:  Irregular irregularity with a rate of about 60-70. LUNGS:  Decreased breath sounds, but she is moving air well. EXTREMITIES:  There is no edema of the ankles.  Admission white cell count is 8700, hemoglobin 11.4.  Recheck BUN is 32, creatinine 1.20, with an estimated GFR of 45.  ASSESSMENT: 1. Atrial fibrillation with rapid ventricular response. 2. Congestive heart failure. 3. Nonischemic cardiomyopathy. 4. Type 2 diabetes. 5. Benign essential hypertension.  Cardiology is being consulted with her.  She usually sees Dr. Dietrich Pates of [facility] for her nonischemic cardiomyopathy.  I discussed all this with the patient, her husband, daughter, all of whom were in the room.     Mila Homer. [name], M.D.     SDK/MEDQ  D:  08/03/2012  T:  08/03/2012  Job:  [id number]

## 2012-08-03 NOTE — H&P (Signed)
[facility] History and Physical  [name]  ZOX:[id number]  DOB: 1946-01-18   DOA: 08/03/2012   PCP:   Milana Obey, MD   Chief Complaint:  Chest pressure for 3 days  HPI: [name] is an 67 y.o. female.  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  In the emergency room she is found to be in atrial fibrillation with a rate in the 140s, and the hospitalist service was called to assist with management.  She has no history of thyroid disease or alcohol use; she has no prior history of atrial fibrillation. She denies lower extremity edema  Rate control is improved on a Cardizem drip.  Rewiew of Systems:   All systems negative except as marked bold or noted in the HPI;  Constitutional:    malaise, fever and chills. ;  Eyes:   eye pain, redness and discharge. ;  ENMT:   ear pain, hoarseness, nasal congestion, sinus pressure and sore throat. ;  Cardiovascular:    chest pain, palpitations, diaphoresis, dyspnea and peripheral edema.  Respiratory:   cough, hemoptysis, wheezing and stridor. ;  Gastrointestinal:  nausea, vomiting, diarrhea, constipation, abdominal pain, melena, blood in stool, hematemesis, jaundice and rectal bleeding. unusual weight loss..   Genitourinary:    frequency, dysuria, incontinence,flank pain and hematuria; Musculoskeletal:   back pain and neck pain.  swelling and trauma.;  Skin: .  pruritus, rash, abrasions, bruising and skin lesion.; ulcerations Neuro:    headache, lightheadedness and neck stiffness.  weakness, altered level of consciousness, altered mental status, extremity weakness,  burning feet, involuntary movement, seizure and syncope.  Psych:    anxiety, depression, insomnia, tearfulness, panic attacks, hallucinations, paranoia, suicidal or homicidal ideation *   Past Medical History  Diagnosis Date  . Cardiomyopathy, nonischemic 10/07    EF 10% in 03/2006, 35% in 10/08 and 10-15% in 10/09 normal coronary angiography in 1999  . 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 Pulmonary embolism 2/07    after total right hip arthroplasty  . Hilar density     infrahilar mass/adenopathy on CT scan 5/07; subsequently  resolved  . Diabetes mellitus, type 2   . LBBB (left bundle branch block)   . GERD (gastroesophageal reflux disease)   . Hyperlipidemia   . Hypertension   . Tobacco abuse     discontinued in 1997, and then resumed  . Urinary incontinence   . Pneumonia     h/o pleural effusion  . Anemia     mild and chronic  . Obstructive sleep apnea     mild-did not tolerate CPAP  . Degenerative joint disease     of knees, shoulder, and hips  . 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 Chronic kidney disease     creatinin-1.44 in 1/09; 1.51 in 1/10    Past Surgical History  Procedure Laterality Date  . Pacemaker removal  11/18/05    Enterococcal infection  . Total hip arthroplasty  2/07    Right  . Abdominal hysterectomy  1990/92    Initial partial hysterectomy followed  by BSO  . Knee arthroscopy      Remote  . Colonoscopy w/ polypectomy  2009  . A-v cardiac pacemaker insertion  12/05    Biventricular pacemaker/AICD    Medications:  HOME MEDS: Prior to Admission medications   Medication Sig Start Date End Date Taking? Authorizing Provider  carvedilol (COREG) 25 MG tablet Take 25 mg by mouth 2 (two) times daily.     Yes Historical Provider, MD  diphenhydramine-acetaminophen (TYLENOL PM EXTRA STRENGTH) 25-500 MG TABS Take 1 tablet by mouth at bedtime as needed (for  sleep).    Yes Historical Provider, MD  furosemide (LASIX) 80 MG tablet Take 40-80 mg by mouth 2 (two) times daily. 1 tab am. 1/2 tab pm   Yes Historical Provider, MD  HYDROcodone-acetaminophen (NORCO) 10-325 MG per tablet Take 1 tablet by mouth 3 (three) times daily as needed for pain (for pain).   Yes Historical Provider, MD  lisinopril (PRINIVIL,ZESTRIL) 10 MG tablet Take 10 mg by mouth daily.   Yes Historical Provider, MD  metFORMIN (GLUCOPHAGE) 1000 MG tablet Take 1,000 mg by mouth 2 (two) times daily.     Yes Historical Provider, MD  polyethylene glycol powder (GLYCOLAX/MIRALAX) powder  05/04/12  Yes Historical Provider, MD  pravastatin (PRAVACHOL) 40 MG tablet Take 40 mg by mouth 2 (two) times daily.    Yes Historical Provider, MD     Allergies:  No Known Allergies  Social History:   reports that she has been smoking Cigarettes.  She has been smoking about 0.00 packs per day. She has never used smokeless tobacco. She reports that she does not drink alcohol or use illicit drugs.  Family History: Family History  Problem Relation Age of Onset  . Hypertension Mother   . Diabetes Mother   . Coronary artery disease Father   . Diabetes Brother   . Hypertension Brother   . Lung cancer Brother   . Arthritis Other   . Diabetes Other   . Heart disease Other     female < 55     Physical Exam: Filed Vitals:   08/03/12 0015 08/03/12 0030 08/03/12 0045 08/03/12 0100  BP:    146/66  Pulse: 71 77 100 55  Temp:      TempSrc:      Resp: 20 21 22 21   Height:      Weight:      SpO2: 95% 95% 96% 95%   Blood pressure 146/66, pulse 55, temperature 98 F (36.7 C), temperature source Oral, resp. rate 21, height 5\' 5"  (1.651 m), weight 81.647 kg (180 lb), SpO2 95.00%.  GEN:  Pleasant elderly Caucasian lady reclining in the stretcher in no acute distress; cooperative with exam PSYCH:  alert and oriented x4; does not appear anxious or depressed; affect is appropriate. HEENT: Mucous membranes  pink and anicteric; PERRLA; EOM intact; no cervical lymphadenopathy nor thyromegaly or carotid bruit; ; Breasts:: Not examined CHEST WALL: No tenderness CHEST: Normal respiration, clear to auscultation bilaterally HEART:  Irregularly irregular rhythm no murmurs rubs or gallops BACK: Mildkyphosis or scoliosis; no CVA tenderness ABDOMEN: Obese, soft non-tender; no masses, no organomegaly, normal abdominal bowel sounds;  no intertriginous candida. Rectal Exam: Not done EXTREMITIES:; age-appropriate arthropathy of the hands and knees; no edema; no ulcerations. Genitalia: not examined PULSES: 2+ and symmetric SKIN: Normal hydration no rash or ulceration CNS: Cranial nerves 2-12 grossly intact no focal lateralizing neurologic deficit   Labs on Admission:  Basic Metabolic Panel:  Recent Labs Lab  08/02/12 2151  NA 142  K 3.6  CL 101  CO2 24  GLUCOSE 235*  BUN 31*  CREATININE 1.33*  CALCIUM 10.6*   Liver Function Tests: No results found for this basename: AST, ALT, ALKPHOS, BILITOT, PROT, ALBUMIN,  in the last 168 hours No results found for this basename: LIPASE, AMYLASE,  in the last 168 hours No results found for this basename: AMMONIA,  in the last 168 hours CBC:  Recent Labs Lab 08/02/12 2151  WBC 10.1  HGB 12.3  HCT 36.7  MCV 93.1  PLT 268   Cardiac Enzymes:  Recent Labs Lab 08/02/12 2151  TROPONINI <0.30   BNP: No components found with this basename: POCBNP,  D-dimer: No components found with this basename: D-DIMER,  CBG: No results found for this basename: GLUCAP,  in the last 168 hours  Radiological Exams on Admission: Dg Chest Portable 1 View  08/02/2012  *RADIOLOGY REPORT*  Clinical Data: Chest pain.  PORTABLE CHEST - 1 VIEW  Comparison: Chest radiograph performed 04/11/2008  Findings: The lungs are well-aerated.  Mild bibasilar opacities may reflect atelectasis or scarring.  There is no evidence of pleural effusion or pneumothorax.  The cardiomediastinal  silhouette is mildly enlarged.  No acute osseous abnormalities are seen.  Degenerative change is noted at the left glenohumeral joint.  IMPRESSION: Mild bibasilar opacities may reflect atelectasis or scarring; mild cardiomegaly again noted.   Original Report Authenticated By: Tonia Ghent, M.D.     EKG: Independently reviewed. Irregular irregular rhythm  Assessment/Plan Present on Admission:  . New onset a-fib . Chest tightness or pressure, likely secondary to tachycardia  . Cardiomyopathy, nonischemic, chronic systolic heart failure  . Degenerative joint disease . Diabetes mellitus, type 2 . GERD (gastroesophageal reflux disease) . HYPERTENSION .   PLAN: We'll admit this lady to the step down unit for a Cardizem drip, because of her significant cardiac pathology in the chart score of 3, will start heparin anticoagulations and consult cardiology.  We'll hold metformin because of her apparent heart failure and add a sliding scale insulin. Other plans as per orders.  Code Status: FULL CODE  Family Communication: Husband present at bedside for interview examination and discussion of plan Disposition Plan: Likely return home after completing evaluation by cardiology  C  Pollyanna Levay Nocturnist [facility] Pager [phone]   08/03/2012, 1:16 AM

## 2012-08-03 NOTE — ED Provider Notes (Signed)
I saw and evaluated the patient, reviewed the resident's note and I agree with the findings and plan. See prior note   Ward Givens, MD 08/03/12 0003

## 2012-08-03 NOTE — Progress Notes (Signed)
ANTICOAGULATION CONSULT NOTE - Initial Consult  Pharmacy Consult for Heparin Indication: atrial fibrillation  No Known Allergies  Patient Measurements: Height: 5\' 5"  (165.1 cm) Weight: 172 lb 2.9 oz (78.1 kg) IBW/kg (Calculated) : 57  Vital Signs: Temp: 97.8 F (36.6 C) (02/11 0800) Temp src: Oral (02/11 0800) BP: 124/72 mmHg (02/11 0927) Pulse Rate: 59 (02/11 0600)  Labs:  Recent Labs  08/02/12 2151 08/02/12 2200 08/03/12 0149 08/03/12 0430 08/03/12 0740 08/03/12 0852  HGB 12.3  --   --  11.4*  --   --   HCT 36.7  --   --  33.9*  --   --   PLT 268  --   --  221  --   --   LABPROT  --  14.8  --   --   --   --   INR  --  1.18  --   --   --   --   HEPARINUNFRC  --   --   --   --   --  0.23*  CREATININE 1.33*  --   --  1.20*  --   --   TROPONINI <0.30  --  <0.30  --  <0.30  --     Estimated Creatinine Clearance: 46.3 ml/min (by C-G formula based on Cr of 1.2).   Medical History: Past Medical History  Diagnosis Date  . Cardiomyopathy, nonischemic 10/07    EF 10% in 03/2006, 35% in 10/08 and 10-15% in 10/09 normal coronary angiography in 1999  . 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 Pulmonary embolism 2/07    after total right hip arthroplasty  . Hilar density     infrahilar mass/adenopathy on CT scan 5/07; subsequently  resolved  . Diabetes mellitus, type 2   . LBBB (left bundle branch block)   . GERD (gastroesophageal reflux disease)   . Hyperlipidemia   . Hypertension   . Tobacco abuse     discontinued in 1997, and then resumed  . Urinary incontinence   . Pneumonia     h/o pleural effusion  . Anemia     mild and chronic  . Obstructive sleep apnea     mild-did not tolerate CPAP  . Degenerative joint disease     of knees, shoulder, and hips  . 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 Chronic kidney disease     creatinin-1.44 in 1/09; 1.51 in 1/10     Medications:  Infusions:  . diltiazem (CARDIZEM) infusion 10 mg/hr (08/03/12 4098)  . heparin 1,000 Units/hr (08/03/12 0600)    Assessment: 66yo female started on IV Heparin last night at 1000 units/hr due to Afib.  Heparin level is below target when checked ~6 hours after infusion started.   Goal of Therapy:  Heparin level 0.3-0.7 units/ml Monitor platelets by anticoagulation protocol: Yes   Plan: Increase Heparin to 1250 units/hr Re-check heparin level in 6 hours. Heparin level daily CBC per protocol  Valrie Hart A 08/03/2012,10:33 AM

## 2012-08-03 NOTE — Progress Notes (Signed)
*  PRELIMINARY RESULTS* Echocardiogram 2D Echocardiogram has been performed.  Conrad Stockton 08/03/2012, 2:14 PM

## 2012-08-03 NOTE — Progress Notes (Signed)
INITIAL NUTRITION ASSESSMENT  DOCUMENTATION CODES Per approved criteria  -Not Applicable   INTERVENTION: .Ensure Complete po BID, each supplement provides 350 kcal and 13 grams of protein.  NUTRITION DIAGNOSIS: Inadequate oral intake related to decreased appetite  as evidenced by meal intake 25% and unplanned wt loss 12#, 6.5% <90 days.   Goal: Pt to meet >/= 90% of their estimated nutrition needs; not met  Monitor:  Meal and supplement intake, I/O's, labs and wt trends  Reason for Assessment: Malnutrition Screen  67 y.o. female  Admitting Dx: Atrial fibrillation, non-ischemic cardiomyopathy, EF 10-15% in 2009  ASSESSMENT: Pt has unplanned wt loss due to poor appetite. Son reports that she has not eaten very well since Sunday. Weight decrease trending toward significant. Chart reviewed. Inadequate meal intake (25% currently). However, she does not meet criteria for malnutrition at this time.  Height: Ht Readings from Last 1 Encounters:  08/03/12 5\' 5"  (1.651 m)    Weight: Wt Readings from Last 1 Encounters:  08/03/12 172 lb 2.9 oz (78.1 kg)    Ideal Body Weight: 125# (56.8 kg)  % Ideal Body Weight: 138%  Wt Readings from Last 10 Encounters:  08/03/12 172 lb 2.9 oz (78.1 kg)  05/10/12 184 lb (83.462 kg)  07/14/11 186 lb (84.369 kg)  01/14/11 190 lb (86.183 kg)  07/12/10 191 lb (86.637 kg)  01/04/10 192 lb (87.091 kg)  06/01/09 191 lb (86.637 kg)  01/29/09 194 lb (87.998 kg)  11/28/08 193 lb (87.544 kg)  11/15/08 195 lb (88.451 kg)    Usual Body Weight: 184# (83.4 kg)  % Usual Body Weight: 93%  BMI:  Body mass index is 28.65 kg/(m^2). Overweight  Estimated Nutritional Needs: Kcal: 1425-1710  Protein: 78-86 gr Fluid: 1 ml/kcal  Skin: no issues noted  Diet Order: Carb Control (1600-2000 kcal) po's 25% currently  EDUCATION NEEDS: -Education needs addressed   Intake/Output Summary (Last 24 hours) at 08/03/12 1104 Last data filed at 08/03/12 0800   Gross per 24 hour  Intake 349.51 ml  Output      0 ml  Net 349.51 ml    Last BM: PTA  Labs:   Recent Labs Lab 08/02/12 2151 08/03/12 0430  NA 142 144  K 3.6 3.6  CL 101 105  CO2 24 24  BUN 31* 32*  CREATININE 1.33* 1.20*  CALCIUM 10.6* 10.2  MG  --  2.0  GLUCOSE 235* 151*    CBG (last 3)   Recent Labs  08/03/12 0258 08/03/12 0723  GLUCAP 176* 133*    Scheduled Meds: . carvedilol  25 mg Oral BID WC  . furosemide  80 mg Oral BID  . insulin aspart  0-5 Units Subcutaneous QHS  . insulin aspart  0-9 Units Subcutaneous TID WC  . lisinopril  10 mg Oral Daily  . metFORMIN  1,000 mg Oral BID WC  . off the beat book   Does not apply Once  . potassium chloride  20 mEq Oral BID  . simvastatin  40 mg Oral q1800    Continuous Infusions: . diltiazem (CARDIZEM) infusion 10 mg/hr (08/03/12 1610)  . heparin 1,000 Units/hr (08/03/12 0600)    Past Medical History  Diagnosis Date  . Cardiomyopathy, nonischemic 10/07    EF 10% in 03/2006, 35% in 10/08 and 10-15% in 10/09 normal coronary angiography in 1999  . 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 Pulmonary embolism 2/07    after total right hip arthroplasty  . Hilar density  infrahilar mass/adenopathy on CT scan 5/07; subsequently  resolved  . Diabetes mellitus, type 2   . LBBB (left bundle branch block)   . GERD (gastroesophageal reflux disease)   . Hyperlipidemia   . Hypertension   . Tobacco abuse     discontinued in 1997, and then resumed  . Urinary incontinence   . Pneumonia     h/o pleural effusion  . Anemia     mild and chronic  . Obstructive sleep apnea     mild-did not tolerate CPAP  . Degenerative joint disease     of knees, shoulder, and hips  . 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 Chronic kidney disease     creatinin-1.44 in 1/09; 1.51 in 1/10    Past Surgical History  Procedure Laterality Date   . Pacemaker removal  11/18/05    Enterococcal infection  . Total hip arthroplasty  2/07    Right  . Abdominal hysterectomy  1990/92    Initial partial hysterectomy followed by BSO  . Knee arthroscopy      Remote  . Colonoscopy w/ polypectomy  2009  . A-v cardiac pacemaker insertion  12/05    Biventricular pacemaker/AICD    [phone]

## 2012-08-03 NOTE — Progress Notes (Signed)
PT Cancellation Note  Patient Details Name: [name] MRN: [id number] DOB: 01-28-1946   Cancelled Treatment:    Pt was interviewed at bedside.  We had received a request for a PT consult, but it is not anticipated that the pt will need one.  She had been independent at home PTA and does not anticipate having any problems when she goes home.  I have asked her RN to let me know if she has any difficulties when pt gets OOB this afternoon.  If not, we will d/c PT consult.  Myrlene Broker L 08/03/2012, 1:48 PM

## 2012-08-03 NOTE — Consult Note (Addendum)
CARDIOLOGY CONSULT NOTE  Patient ID: CHRISTMAS FARACI MRN: [id number] DOB/AGE: 03-15-1946 67 y.o.  Admit date: 08/02/2012 Referring Physician: John Giovanni MD Primary Fransisca Connors, MD Primary Cardiologist:Rothbart, Molly Maduro MD Reason for Consultation: New Onset Atrial fibrillation, NICM, Chest pain Active Problems:   HYPERTENSION   Cardiomyopathy, nonischemic   Diabetes mellitus, type 2   GERD (gastroesophageal reflux disease)   Degenerative joint disease   New onset a-fib   Chest tightness or pressure   Systolic HF (heart failure)  HPI: Mrs. Tremain is a 67 y/o patient admitted with new onset atrial fib with RVR, with symptoms beginning 3 days prior to admission with associated chest pressure and mild shortness of breath, and fatigue. She came to ER at the insistence of her husband.  Found to be in atrial fib with RVR heart rates up into the 140's. She was placed on cardizem gtt with heart rate control achieved. BP 139/90. She was found to be low normal on potassium at 3.6, elevated blood glucose of 235, Creatinine of 1.33. Pro-BNP of 27, 771.  CXR demonstrated mild bibasilar opacities reflecting atelectasis or scarring, but no pulmonary edema or CHF. Troponin negative X 2. Echocardiogram has been completed but not yet read.     She has a history of non-ischemic CM, most recent EF of 10%-15% in 2009, with moderate MR. She had an AICD pacemaker but this was removed in the setting of sepsis, lead infection and endocarditis. Other history includes DM, LBBB, PE  CKD, tobacco abuse and hyperlipidemia. She continues on Cardizem drip at 5 mg/hr with good rate control. She is feeling better but not at baseline.   Review of systems complete and found to be negative unless listed above   Past Medical History  Diagnosis Date  . Cardiomyopathy, nonischemic 10/07    EF 10% in 03/2006, 35% in 10/08 and 10-15% in 10/09 normal coronary angiography in 1999  . 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 Pulmonary embolism 2/07    after total right hip arthroplasty  . Hilar density     infrahilar mass/adenopathy on CT scan 5/07; subsequently  resolved  . Diabetes mellitus, type 2   . LBBB (left bundle branch block)   . GERD (gastroesophageal reflux disease)   . Hyperlipidemia   . Hypertension   . Tobacco abuse     discontinued in 1997, and then resumed  . Urinary incontinence   . Pneumonia     h/o pleural effusion  . Anemia     mild and chronic  . Obstructive sleep apnea     mild-did not tolerate CPAP  . Degenerative joint disease     of knees, shoulder, and hips  . 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 Chronic kidney disease     creatinin-1.44 in 1/09; 1.51 in 1/10    Family History  Problem Relation Age of Onset  . Hypertension Mother   . Diabetes Mother   . Coronary artery disease Father   . Diabetes Brother   . Hypertension Brother   . Lung cancer Brother   . Arthritis Other   . Diabetes Other   . Heart disease Other     female < 69    History   Social History  . Marital Status: Married    Spouse Name: N/A    Number of Children: N/A  . Years of Education: N/A   Occupational  History  . Not on file.   Social History Main Topics  . Smoking status: Former Smoker    Types: Cigarettes    Quit date: 08/04/2007  . Smokeless tobacco: Never Used     Comment: 1 pack per month   . Alcohol Use: No  . Drug Use: No  . Sexually Active: Not Currently    Birth Control/ Protection: None   Other Topics Concern  . Not on file   Social History Narrative   Married, lives with spouse. Retired Advertising copywriter.     Past Surgical History  Procedure Laterality Date  . Pacemaker removal  11/18/05    Enterococcal infection  . Total hip arthroplasty  2/07    Right  . Abdominal hysterectomy  1990/92    Initial partial hysterectomy followed by BSO  . Knee arthroscopy       Remote  . Colonoscopy w/ polypectomy  2009  . A-v cardiac pacemaker insertion  12/05    Biventricular pacemaker/AICD     Prescriptions prior to admission  Medication Sig Dispense Refill  . carvedilol (COREG) 25 MG tablet Take 25 mg by mouth 2 (two) times daily.        . diphenhydramine-acetaminophen (TYLENOL PM EXTRA STRENGTH) 25-500 MG TABS Take 1 tablet by mouth at bedtime as needed (for sleep).       . furosemide (LASIX) 80 MG tablet Take 40-80 mg by mouth 2 (two) times daily. 1 tab am. 1/2 tab pm      . HYDROcodone-acetaminophen (NORCO) 10-325 MG per tablet Take 1 tablet by mouth 3 (three) times daily as needed for pain (for pain).      Marland Kitchen lisinopril (PRINIVIL,ZESTRIL) 10 MG tablet Take 10 mg by mouth daily.      . metFORMIN (GLUCOPHAGE) 1000 MG tablet Take 1,000 mg by mouth 2 (two) times daily.        . polyethylene glycol powder (GLYCOLAX/MIRALAX) powder       . pravastatin (PRAVACHOL) 40 MG tablet Take 40 mg by mouth 2 (two) times daily.         Physical Exam: Blood pressure 124/72, pulse 59, temperature 97.8 F (36.6 C), temperature source Oral, resp. rate 23, height 5\' 5"  (1.651 m), weight 172 lb 2.9 oz (78.1 kg), SpO2 93.00%.    General: Well developed, well nourished, in no acute distress, feeling very weak. Head: Eyes PERRLA, No xanthomas.   Normal cephalic and atramatic  Lungs: Clear bilaterally to auscultation with the exception of the bases. Poor respiratory effort is noted. Heart: HRIR S1 S2, 1/6 systolic murmur at the apex,.  Pulses are 2+ & equal.            No carotid bruit. No JVD.  No abdominal bruits. No femoral bruits. Abdomen: Bowel sounds are positive, abdomen soft and non-tender without masses or                  Hernia's noted. Msk:  Back normal, . Normal strength and tone for age. Extremities: Mild clubbing, cyanosis, no edema.  DP +1 Neuro: Alert and oriented X 3. Psych:  Good affect, responds appropriately   Labs:   Lab Results  Component Value Date    WBC 8.7 08/03/2012   HGB 11.4* 08/03/2012   HCT 33.9* 08/03/2012   MCV 92.4 08/03/2012   PLT 221 08/03/2012    Recent Labs Lab 08/03/12 0149 08/03/12 0430  NA  --  144  K  --  3.6  CL  --  105  CO2  --  24  BUN  --  32*  CREATININE  --  1.20*  CALCIUM  --  10.2  PROT 7.7  --   BILITOT 0.9  --   ALKPHOS 80  --   ALT 15  --   AST 13  --   GLUCOSE  --  151*   Lab Results  Component Value Date   CKTOTAL 58 04/10/2008   CKMB 2.8 04/10/2008   TROPONINI <0.30 08/03/2012    Lab Results  Component Value Date   CHOL 171 05/27/2010   CHOL 168 07/09/2009   CHOL 174 12/02/2008   Lab Results  Component Value Date   HDL 38 05/27/2010   HDL 33 08/06/863   HDL 34 7/84/6962   Lab Results  Component Value Date   LDLCALC 102 05/27/2010   LDLCALC 43 07/09/2009   LDLCALC 88 12/02/2008   Lab Results  Component Value Date   TRIG 154 05/27/2010   TRIG 215 07/09/2009   TRIG 258 12/02/2008   Lab Results  Component Value Date   CHOLHDL 7.7 Ratio 07/16/2007   No results found for this basename: LDLDIRECT     Echocardiogram: 04/11/2008 SUMMARY - The left ventricle was mildly dilated. Overall left ventricular systolic function was severely reduced. Left ventricular ejection fraction was estimated , range being 10 % to 15 %. There was severe diffuse left ventricular hypokinesis. Left ventricular Devell Parkerson thickness was mildly increased. - There was trivial aortic valvular regurgitation. - There was mild fibrocalcific change of the aortic root. - There was mild mitral annular calcification. There was mild to moderate mitral valvular regurgitation. The effective orifice of mitral regurgitation by proximal isovelocity surface area was 0.13 cm^2. The volume of mitral regurgitation by proximal isovelocity surface area was 26 cc. - The left atrium was moderately dilated. The interatrial septum bows from right to left, consistent with increased right atrial pressure. - The estimated peak right  ventricular systolic pressure was mildly increased. - There was mild to moderate tricuspid valvular regurgitation. - There was a trivial pericardial effusion posterior to the heart.  Radiology: Dg Chest Portable 1 View  08/02/2012  *RADIOLOGY REPORT*  Clinical Data: Chest pain.  PORTABLE CHEST - 1 VIEW  Comparison: Chest radiograph performed 04/11/2008  Findings: The lungs are well-aerated.  Mild bibasilar opacities may reflect atelectasis or scarring.  There is no evidence of pleural effusion or pneumothorax.  The cardiomediastinal silhouette is mildly enlarged.  No acute osseous abnormalities are seen.  Degenerative change is noted at the left glenohumeral joint.  IMPRESSION: Mild bibasilar opacities may reflect atelectasis or scarring; mild cardiomegaly again noted.   Original Report Authenticated By: Tonia Ghent, M.D.    EKG: Atrial fibrillation with rapid ventricular response rate of 130 bpm Left axis deviation Left bundle branch block  ASSESSMENT AND PLAN:   1. New Onset Atrial Fibrillation with RVR: Uncertain etiology at present. No overt infective process. No ACS noted on EKG or per labs. She is responding to diltiazem gtt with good HR control, at 5 mg/hr. Repeat echo has been completed, but not yet read.  CHADs Score 3 for hypertension, diabetes, CHF. She is now on heparin gtt.Will place coumadin per pharmacy vs novel anticoagulant (Xarelto) but at 15 mg in the setting of CKD. Will discuss with Dr.Gizel Riedlinger prior to beginning po anticoagulant.   2. Non-ischemic CM: Most recent echo in 2009 with EF of 10-15%. No AICD as she had infective endocarditis, requiring removal of device.  This may be reconsidered, but will wait until she is stable and discuss with EP. Continue coreg 25 mg BID, lasix, ACE inhibitor. Would not start spironolactone with CKD. Creatinine of 1.20 now. Replete potassium.  3. Hypertension: BP in normal range for severe systolic dysfunction. Continue lisinopril at 10mg  for  now unless she becomes significantly hypotensive.  4. Normocytic Anemia: Hgb at 11.4, Hct 33.9,. May be dilutional in the setting of CHF. Anemia profile negative for iron deficiency, but has low Sat %.  5. Systolic CHF: Pro-BNP 27, 771. If she does not begin to diurese, may consider transfer to [facility] for milrinone gtt, but with low EF, high likelihood for ventricular arrhythmias. Ultrafiltration may be better option. No overt signs of fluid overload on clinical exam, with the exception of diminished lung sounds in the base. No JVD is seen. Await echo for evaluation of systolic fx.   6. Diabetes 7. Hx of PE 8. CKD: Baseline creatinine 1.4 9. OSA 10.Hyperlipidemia 11. Hx of GERD  Signed: Bettey Mare. Lyman Bishop NP [facility] 08/03/2012, 11:05 AM Co-Sign MD I have taken a history, reviewed medications, allergies, PMH, SH, FH, and reviewed ROS and examined the patient.  I agree with the assessment and plan. She is high risk for thromboembolic events will need oral anticoagulation. Add low dose spironolactone at 25mg  q day.I would also consider amiodarone in am if does not convert on IV diltiazem.  Lovelle Deitrick C. Daleen Squibb, MD, [facility] Pager:  [phone]

## 2012-08-03 NOTE — Progress Notes (Signed)
UR Chart Review Completed  

## 2012-08-03 NOTE — Care Management Note (Signed)
    Page 1 of 1   08/09/2012     10:52:04 AM   CARE MANAGEMENT NOTE 08/09/2012  Patient:  [name], [name]   Account Number:  [id number]  Date Initiated:  08/03/2012  Documentation initiated by:  Sharrie Rothman  Subjective/Objective Assessment:   Pt admitted from home with a fib and CHF. Pt lives with her husband and will return home at discharge. Pt is fairly independent with ADL's but does have a cane and walker for prn use.     Action/Plan:   No CM or HH needs noted.   Anticipated DC Date:  08/05/2012   Anticipated DC Plan:  HOME/SELF CARE      DC Planning Services  CM consult      Choice offered to / List presented to:             Status of service:  Completed, signed off Medicare Important Message given?  YES (If response is "NO", the following Medicare IM given date fields will be blank) Date Medicare IM given:  08/09/2012 Date Additional Medicare IM given:    Discharge Disposition:  HOME/SELF CARE  Per UR Regulation:    If discussed at Long Length of Stay Meetings, dates discussed:    Comments:  08/09/12 1050 Arlyss Queen, RN BSN CM Pt discharged home today. No CM or HH needs noted. Dr. [name] to follow pts PT/INR and coumadin.  08/03/12 1350 Arlyss Queen, RN BSN CM

## 2012-08-03 NOTE — Plan of Care (Signed)
Problem: Consults Goal: Atrial Arhythmia Patient Education (See Patient Education module for education specifics.)  Outcome: Progressing Ordered off the beat book Goal: Skin Care Protocol Initiated - if Braden Score 18 or less If consults are not indicated, leave blank or document N/A  Outcome: Progressing Pressure ulcer prevention handout Goal: Nutrition Consult-if indicated Outcome: Progressing ordered  Problem: Phase I Progression Outcomes Goal: Anticoagulation Therapy per MD order Outcome: Progressing ordered

## 2012-08-04 DIAGNOSIS — N189 Chronic kidney disease, unspecified: Secondary | ICD-10-CM

## 2012-08-04 LAB — URINALYSIS, ROUTINE W REFLEX MICROSCOPIC
Bilirubin Urine: NEGATIVE
Glucose, UA: NEGATIVE mg/dL
Ketones, ur: NEGATIVE mg/dL
Nitrite: NEGATIVE
Specific Gravity, Urine: <1.005 — ABNORMAL LOW (ref 1.005–1.030)
pH: 6 (ref 5.0–8.0)

## 2012-08-04 LAB — HEPARIN LEVEL (UNFRACTIONATED): Heparin Unfractionated: 0.26 IU/mL — ABNORMAL LOW (ref 0.30–0.70)

## 2012-08-04 LAB — GLUCOSE, CAPILLARY
Glucose-Capillary: 167 mg/dL — ABNORMAL HIGH (ref 70–99)
Glucose-Capillary: 161 mg/dL — ABNORMAL HIGH (ref 70–99)
Glucose-Capillary: 131 mg/dL — ABNORMAL HIGH (ref 70–99)

## 2012-08-04 LAB — URINE MICROSCOPIC-ADD ON

## 2012-08-04 MED ORDER — LISINOPRIL 5 MG PO TABS
5.0000 mg | ORAL_TABLET | Freq: Every day | ORAL | Status: DC
  Administered 2012-08-05 – 2012-08-09 (×5): 5 mg via ORAL
  Filled 2012-08-04: qty 1
  Filled 2012-08-04: qty 2
  Filled 2012-08-04 (×3): qty 1

## 2012-08-04 MED ORDER — DILTIAZEM HCL 30 MG PO TABS
30.0000 mg | ORAL_TABLET | Freq: Three times a day (TID) | ORAL | Status: DC
Start: 2012-08-04 — End: 2012-08-08
  Administered 2012-08-04 – 2012-08-08 (×12): 30 mg via ORAL
  Filled 2012-08-04 (×12): qty 1

## 2012-08-04 MED ORDER — FUROSEMIDE 80 MG PO TABS
80.0000 mg | ORAL_TABLET | Freq: Every day | ORAL | Status: DC
  Administered 2012-08-05 – 2012-08-09 (×5): 80 mg via ORAL
  Filled 2012-08-04 (×5): qty 1

## 2012-08-04 MED ORDER — FUROSEMIDE 40 MG PO TABS
40.0000 mg | ORAL_TABLET | Freq: Every day | ORAL | Status: DC
  Administered 2012-08-04: 40 mg via ORAL
  Filled 2012-08-04: qty 1

## 2012-08-04 MED ORDER — ATORVASTATIN CALCIUM 20 MG PO TABS
20.0000 mg | ORAL_TABLET | Freq: Every day | ORAL | Status: DC
  Administered 2012-08-04 – 2012-08-08 (×5): 20 mg via ORAL
  Filled 2012-08-04 (×5): qty 1

## 2012-08-04 MED ORDER — RIVAROXABAN 10 MG PO TABS
15.0000 mg | ORAL_TABLET | Freq: Every day | ORAL | Status: DC
  Administered 2012-08-04: 15 mg via ORAL
  Filled 2012-08-04: qty 2

## 2012-08-04 NOTE — Progress Notes (Signed)
Primary cardiologist: Dr. Grafton Bing  Subjective:  Feeling better, no cardiac complaints.  Objective:  Vital Signs in the last 24 hours: Temp:  [97.7 F (36.5 C)-98 F (36.7 C)] 97.9 F (36.6 C) (02/12 0400) Pulse Rate:  [27-118] 118 (02/12 0600) Resp:  [13-22] 21 (02/12 0600) BP: (65-124)/(50-89) 104/59 mmHg (02/12 0500) SpO2:  [75 %-97 %] 94 % (02/12 0600) Weight:  [183 lb 6.8 oz (83.2 kg)] 183 lb 6.8 oz (83.2 kg) (02/12 0500)  Intake/Output from previous day: 02/11 0701 - 02/12 0700 In: 1340.8 [P.O.:937; I.V.:403.8] Out: -   Telemetry: Atrial fibrillation, rate controlled.   Physical Exam: NECK: Without JVD, HJR, or bruit LUNGS: Clear anterior, posterior, lateral HEART:Irregular rate and rhythm, no murmur, gallop, rub, bruit, thrill, or heave EXTREMITIES: Without cyanosis, clubbing, or edema   Lab Results:  Recent Labs  08/02/12 2151 08/03/12 0430  WBC 10.1 8.7  HGB 12.3 11.4*  PLT 268 221    Recent Labs  08/02/12 2151 08/03/12 0430  NA 142 144  K 3.6 3.6  CL 101 105  CO2 24 24  GLUCOSE 235* 151*  BUN 31* 32*  CREATININE 1.33* 1.20*    Recent Labs  08/03/12 0740 08/03/12 1356  TROPONINI <0.30 <0.30   Hepatic Function Panel  Recent Labs  08/03/12 0149  PROT 7.7  ALBUMIN 4.1  AST 13  ALT 15  ALKPHOS 80  BILITOT 0.9  BILIDIR 0.2  IBILI 0.7    Echocardiogram: 04/11/2008 SUMMARY - The left ventricle was mildly dilated. Overall left ventricular systolic function was severely reduced. Left ventricular ejection fraction was estimated , range being 10 % to 15 %. There was severe diffuse left ventricular hypokinesis. Left ventricular wall thickness was mildly increased. - There was trivial aortic valvular regurgitation. - There was mild fibrocalcific change of the aortic root. - There was mild mitral annular calcification. There was mild to moderate mitral valvular regurgitation. The effective orifice of mitral regurgitation by  proximal isovelocity surface area was 0.13 cm^2. The volume of mitral regurgitation by proximal isovelocity surface area was 26 cc. - The left atrium was moderately dilated. The interatrial septum bows from right to left, consistent with increased right atrial pressure. - The estimated peak right ventricular systolic pressure was mildly increased. - There was mild to moderate tricuspid valvular regurgitation. - There was a trivial pericardial effusion posterior to the heart.   Assessment/Plan:   1. New Onset Atrial Fibrillation with RVR: . No ACS noted per labs. She is responding to diltiazem gtt with good HR control, at 5 mg/hr. Will change to oral cardizemv 30mg  q8 hrs. Also on Coreg 25mg  BID. Repeat echo pending.  CHADS score 3 for hypertension, diabetes, CHF. She is now on heparin gtt. Dr. Daleen Squibb recommended starting oral anticoagulation based on consultation yesterday.   2. Non-ischemic CM: Most recent echo in 2009 with EF of 10-15%. No AICD as she had infective endocarditis, requiring removal of device. Creatinine of 1.20 now. Was on Lasix 80mg  am 40mg  pm at home. Will change lasix back to this with no evidence of CHF on low BP. Replete potassium.  3. Hypertension: BP low. Will need to decrease lisinopril to 5mg  for now.  4. Normocytic Anemia: Hgb at 11.4, Hct 33.9,. May be dilutional in the setting of CHF. Anemia profile negative for iron deficiency, but has low Sat %.  5. Systolic CHF: Pro-BNP 27, 771. I/o's positive 1354 yesterday.  6. Diabetes  7. Hx of PE  8. CKD:  Baseline creatinine 1.4  9. OSA  10.Hyperlipidemia  11. Hx of GERD   LOS: 2 days   Jacolyn Reedy 08/04/2012, 8:01 AM  Attending note:  Patient seen and examined. Reviewed records and modified above note by Ms. Geni Bers PAC. Ms. Frame feels better today in general, no chest pain or palpitations. Heart rate is much better controlled on intravenous diltiazem, remains in atrial fibrillation by telemetry.  Duration is uncertain. Dr. Daleen Squibb saw her in consultation today and has recommended oral anticoagulant therapy, nothing ordered yet. Echocardiogram report is pending. Cardiac enzymes are normal arguing against ACS. She has not had any further chest pain symptoms. Plan to convert Cardizem to oral dosing, continue remaining medications with reduction ACE inhibitor related to somewhat low blood pressure. Will plan to discontinue heparin and initiate Xarelto 15 mg daily, GFR 45.  Jonelle Sidle, M.D., F.A.C.C.

## 2012-08-04 NOTE — Progress Notes (Signed)
[name], [name] NO.:  [id number]  MEDICAL RECORD NO.:  [id number]  LOCATION:  IC01                          FACILITY:  [facility]  PHYSICIAN:  Mila Homer. [name], M.D.DATE OF BIRTH:  04-06-1946  DATE OF PROCEDURE: DATE OF DISCHARGE:                                PROGRESS NOTE   SUBJECTIVE:  She is feeling fairly well.  She is not short of breath. She is still somewhat weak.  OBJECTIVE:  GENERAL:  She is in the ICU.  Her husband is with her. VITAL SIGNS:  Temperature 97.9, pulse 80, respiratory rate 16, blood pressure 108/66, O2 sat is 95%. SKIN:  Color is good. NEUROLOGIC:  Her speech is normal.  Her sensorium is intact. HEART:  An irregular irregularity, rate of about 80. LUNGS:  Appear to be clear throughout.  Her troponin has stayed at less than 0.30.  Her cardiac echo was done yesterday, but results not ready yet.  ASSESSMENT: 1. Atrial fibrillation, rapid ventricular response. 2. Congestive heart failure. 3. Nonischemic cardiomyopathy. 4. Type 2 diabetes. 5. Benign essential hypertension.  PLAN:  I reviewed the note from Dr. Daleen Squibb.  Cardiac echo results are pending.  She may need to be transferred to [facility].  I discussed this with the patient and her family.     Mila Homer. [name], M.D.     SDK/MEDQ  D:  08/04/2012  T:  08/04/2012  Job:  [id number]

## 2012-08-04 NOTE — Progress Notes (Signed)
ANTICOAGULATION CONSULT NOTE   Pharmacy Consult for Heparin Indication: atrial fibrillation  No Known Allergies  Patient Measurements: Height: 5\' 5"  (165.1 cm) Weight: 183 lb 6.8 oz (83.2 kg) IBW/kg (Calculated) : 57  Vital Signs: Temp: 97.9 F (36.6 C) (02/12 0400) Temp src: Oral (02/12 0400) BP: 104/59 mmHg (02/12 0500) Pulse Rate: 118 (02/12 0600)  Labs:  Recent Labs  08/02/12 2151 08/02/12 2200 08/03/12 0149 08/03/12 0430 08/03/12 0740 08/03/12 0852 08/03/12 1356 08/03/12 1649 08/04/12 0419  HGB 12.3  --   --  11.4*  --   --   --   --   --   HCT 36.7  --   --  33.9*  --   --   --   --   --   PLT 268  --   --  221  --   --   --   --   --   LABPROT  --  14.8  --   --   --   --   --   --   --   INR  --  1.18  --   --   --   --   --   --   --   HEPARINUNFRC  --   --   --   --   --  0.23*  --  0.30 0.26*  CREATININE 1.33*  --   --  1.20*  --   --   --   --   --   TROPONINI <0.30  --  <0.30  --  <0.30  --  <0.30  --   --    Estimated Creatinine Clearance: 47.8 ml/min (by C-G formula based on Cr of 1.2).  Medical History: Past Medical History  Diagnosis Date  . Cardiomyopathy, nonischemic 10/07    EF 10% in 03/2006, 35% in 10/08 and 10-15% in 10/09 normal coronary angiography in 1999  . 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 Pulmonary embolism 2/07    after total right hip arthroplasty  . Hilar density     infrahilar mass/adenopathy on CT scan 5/07; subsequently  resolved  . Diabetes mellitus, type 2   . LBBB (left bundle branch block)   . GERD (gastroesophageal reflux disease)   . Hyperlipidemia   . Hypertension   . Tobacco abuse     discontinued in 1997, and then resumed  . Urinary incontinence   . Pneumonia     h/o pleural effusion  . Anemia     mild and chronic  . Obstructive sleep apnea     mild-did not tolerate CPAP  . Degenerative joint disease     of knees, shoulder, and hips  . Villous adenoma of colon     tubovillous adenomatous  polyp with focal high grade dysplasia; presented with hematochezia  . Chronic kidney disease     creatinin-1.44 in 1/09; 1.51 in 1/10   Medications:  Infusions:  . diltiazem (CARDIZEM) infusion 5 mg/hr (08/04/12 0600)  . heparin 1,250 Units/hr (08/04/12 0600)   Assessment: 66yo female started on IV Heparin 2/10 due to Afib.  Heparin level was therapeutic yesterday pm but is below target this am.  PLTS OK.  Goal of Therapy:  Heparin level 0.3-0.7 units/ml Monitor platelets by anticoagulation protocol: Yes   Plan: Increase Heparin to 1400 units/hr Re-check heparin level in 6 hours. Heparin level daily CBC per protocol  Valrie Hart A 08/04/2012,7:49 AM

## 2012-08-05 LAB — GLUCOSE, CAPILLARY
Glucose-Capillary: 140 mg/dL — ABNORMAL HIGH (ref 70–99)
Glucose-Capillary: 142 mg/dL — ABNORMAL HIGH (ref 70–99)
Glucose-Capillary: 103 mg/dL — ABNORMAL HIGH (ref 70–99)
Glucose-Capillary: 153 mg/dL — ABNORMAL HIGH (ref 70–99)

## 2012-08-05 LAB — BASIC METABOLIC PANEL
BUN: 40 mg/dL — ABNORMAL HIGH (ref 6–23)
Calcium: 10.4 mg/dL (ref 8.4–10.5)
Creatinine, Ser: 1.29 mg/dL — ABNORMAL HIGH (ref 0.50–1.10)
GFR calc Af Amer: 48 mL/min — ABNORMAL LOW (ref >90)
GFR calc non Af Amer: 42 mL/min — ABNORMAL LOW (ref >90)

## 2012-08-05 LAB — CBC
HCT: 33.4 % — ABNORMAL LOW (ref 36.0–46.0)
Hemoglobin: 11.1 g/dL — ABNORMAL LOW (ref 12.0–15.0)
MCV: 94.1 fL (ref 78.0–100.0)
WBC: 6 10*3/uL (ref 4.0–10.5)

## 2012-08-05 MED ORDER — RIVAROXABAN 10 MG PO TABS
15.0000 mg | ORAL_TABLET | Freq: Every day | ORAL | Status: DC
  Administered 2012-08-05: 15 mg via ORAL
  Filled 2012-08-05 (×2): qty 2

## 2012-08-06 DIAGNOSIS — I10 Essential (primary) hypertension: Secondary | ICD-10-CM

## 2012-08-06 LAB — GLUCOSE, CAPILLARY: Glucose-Capillary: 176 mg/dL — ABNORMAL HIGH (ref 70–99)

## 2012-08-06 LAB — URINE CULTURE

## 2012-08-06 LAB — PROTIME-INR
INR: 1.16 (ref 0.00–1.49)
Prothrombin Time: 14.6 seconds (ref 11.6–15.2)

## 2012-08-06 MED ORDER — ENOXAPARIN SODIUM 80 MG/0.8ML ~~LOC~~ SOLN
80.0000 mg | Freq: Two times a day (BID) | SUBCUTANEOUS | Status: DC
  Administered 2012-08-06 – 2012-08-09 (×6): 80 mg via SUBCUTANEOUS
  Filled 2012-08-06 (×6): qty 0.8

## 2012-08-06 MED ORDER — WARFARIN SODIUM 10 MG PO TABS
10.0000 mg | ORAL_TABLET | Freq: Once | ORAL | Status: AC
  Administered 2012-08-06: 10 mg via ORAL
  Filled 2012-08-06: qty 1

## 2012-08-06 MED ORDER — PATIENT'S GUIDE TO USING COUMADIN BOOK
Freq: Once | Status: AC
  Administered 2012-08-06: 18:00:00
  Filled 2012-08-06: qty 1

## 2012-08-06 MED ORDER — WARFARIN VIDEO
Freq: Once | Status: AC
  Administered 2012-08-06: 18:00:00

## 2012-08-06 MED ORDER — WARFARIN - PHARMACIST DOSING INPATIENT
Freq: Every day | Status: DC
  Administered 2012-08-06: 18:00:00

## 2012-08-06 MED ORDER — CARVEDILOL 12.5 MG PO TABS
50.0000 mg | ORAL_TABLET | Freq: Two times a day (BID) | ORAL | Status: DC
  Administered 2012-08-06 – 2012-08-08 (×4): 50 mg via ORAL
  Filled 2012-08-06 (×2): qty 2
  Filled 2012-08-06 (×2): qty 4
  Filled 2012-08-06: qty 2
  Filled 2012-08-06: qty 4
  Filled 2012-08-06: qty 2
  Filled 2012-08-06: qty 4

## 2012-08-06 NOTE — Progress Notes (Signed)
Patient had medium hard, formed BM today.  Some red blood noted in commode and when wiping.  Blood is in small amount, but bright red.  Patient states she has hemorrhoids and it appears that they are bleeding some when passing stool.  Stool was light brown in color - no blood in actual stool evident.  Will continue to monitor for bleeding

## 2012-08-06 NOTE — Progress Notes (Signed)
ANTICOAGULATION CONSULT NOTE - Initial Consult  Pharmacy Consult for Lovenox and Coumadin Indication: atrial fibrillation  No Known Allergies  Patient Measurements: Height: 5\' 5"  (165.1 cm) Weight: 170 lb 13.7 oz (77.5 kg) IBW/kg (Calculated) : 57  Vital Signs: Temp: 98 F (36.7 C) (02/14 1132) Temp src: Oral (02/14 1132) BP: 93/54 mmHg (02/14 1100) Pulse Rate: 36 (02/14 1132)  Labs:  Recent Labs  08/03/12 1356 08/03/12 1649 08/04/12 0419 08/05/12 0422 08/05/12 0500 08/06/12 1108  HGB  --   --   --  11.1*  --   --   HCT  --   --   --  33.4*  --   --   PLT  --   --   --  197  --   --   LABPROT  --   --   --   --   --  14.6  INR  --   --   --   --   --  1.16  HEPARINUNFRC  --  0.30 0.26*  --   --   --   CREATININE  --   --   --   --  1.29*  --   TROPONINI <0.30  --   --   --   --   --     Estimated Creatinine Clearance: 43 ml/min (by C-G formula based on Cr of 1.29).  Medical History: Past Medical History  Diagnosis Date  . Cardiomyopathy, nonischemic 10/07    EF 10% in 03/2006, 35% in 10/08 and 10-15% in 10/09 normal coronary angiography in 1999  . 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 Pulmonary embolism 2/07    after total right hip arthroplasty  . Hilar density     infrahilar mass/adenopathy on CT scan 5/07; subsequently  resolved  . Diabetes mellitus, type 2   . LBBB (left bundle branch block)   . GERD (gastroesophageal reflux disease)   . Hyperlipidemia   . Hypertension   . Tobacco abuse     discontinued in 1997, and then resumed  . Urinary incontinence   . Pneumonia     h/o pleural effusion  . Anemia     mild and chronic  . Obstructive sleep apnea     mild-did not tolerate CPAP  . Degenerative joint disease     of knees, shoulder, and hips  . 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 Chronic kidney disease     creatinin-1.44 in 1/09; 1.51 in 1/10    Medications:  Scheduled:  . atorvastatin  20 mg Oral q1800  . carvedilol  25 mg Oral BID WC  . diltiazem  30 mg Oral Q8H  . enoxaparin (LOVENOX) injection  80 mg Subcutaneous Q12H  . furosemide  80 mg Oral Daily  . insulin aspart  0-5 Units Subcutaneous QHS  . insulin aspart  0-9 Units Subcutaneous TID WC  . lisinopril  5 mg Oral Daily  . metFORMIN  1,000 mg Oral BID WC  . off the beat book   Does not apply Once  . potassium chloride  20 mEq Oral BID  . spironolactone  25 mg Oral Daily  . warfarin  10 mg Oral ONCE-1800  . Warfarin - Pharmacist Dosing Inpatient   Does not apply q1800  . [DISCONTINUED] rivaroxaban  15 mg Oral Q supper    Assessment: 67yo female who was started on Xarelto  by cardiology due to afib.  For financial reasons, was switched to Coumadin today with Lovenox bridging until INR >/= 2.  Pt has h/o PE after ortho surgery. Goal of Therapy:  INR 2-3 Monitor platelets by anticoagulation protocol: Yes   Plan: Lovenox 80mg  SQ q12hrs (1mg /Kg per dose) Coumadin 10mg  today x 1 INR daily Coumadin education (book and video ordered)  Valrie Hart A 08/06/2012,1:53 PM

## 2012-08-06 NOTE — Progress Notes (Signed)
[name]  67 y.o.  female  Subjective: Dyspnea and palpitations resolved; she feels as if she is about back to her baseline.  Allergy: Review of patient's allergies indicates no known allergies.  Objective: Vital signs in last 24 hours: Temp:  [97.7 F (36.5 C)-98.2 F (36.8 C)] 97.7 F (36.5 C) (02/14 1513) Pulse Rate:  [36-116] 110 (02/14 1513) Resp:  [13-22] 20 (02/14 1513) BP: (70-137)/(45-115) 114/84 mmHg (02/14 1513) SpO2:  [89 %-98 %] 98 % (02/14 1513) Weight:  [77.5 kg (170 lb 13.7 oz)] 77.5 kg (170 lb 13.7 oz) (02/14 0500)  77.5 kg (170 lb 13.7 oz) Body mass index is 28.43 kg/(m^2).  Weight change: -2.3 kg (-5 lb 1.1 oz) Last BM Date: 08/06/12  Intake/Output from previous day: 02/13 0701 - 02/14 0700 In: 2040 [P.O.:2040] Out: -  Total I/O since admission: Inaccurate; no urine output recorded. Weight has been variable, but is 1-4 kg less than on admission.  General- Well developed; no acute distress Neck- No JVD, no carotid bruits Lungs- clear lung fields; normal I:E ratio Cardiovascular- normal PMI; normal S1 and S2; grade 1-2/6 systolic murmur at the left sternal border Abdomen- normal bowel sounds; soft and non-tender without masses or organomegaly Skin- Warm, no significant lesions Extremities- Nl distal pulses; trace edema  Lab Results: CBC:   Recent Labs  08/05/12 0422  WBC 6.0  HGB 11.1*  HCT 33.4*  PLT 197   BMET:  Recent Labs  08/05/12 0500  NA 140  K 3.8  CL 103  CO2 26  GLUCOSE 130*  BUN 40*  CREATININE 1.29*  CALCIUM 10.4  GFR:  Estimated Creatinine Clearance: 43 ml/min (by C-G formula based on Cr of 1.29). Lipids:  Lipid Panel     Component Value Date/Time   CHOL 171 05/27/2010   TRIG 154 05/27/2010   HDL 38 05/27/2010   CHOLHDL 7.7 Ratio 07/16/2007 0000   VLDL 64* 07/16/2007 0000   LDLCALC 102 05/27/2010   Telemetry: Atrial fibrillation persists; ventricular rate  Imaging Studies/Results: Echocardiogram: EF of 20%; marked left  atrial enlargement; mild to moderate MR; heart rate of 95-110 bpm.  Imaging: Imaging results have been reviewed  Medications:  I have reviewed the patient's current medications. Scheduled: . atorvastatin  20 mg Oral q1800  . carvedilol  25 mg Oral BID WC  . diltiazem  30 mg Oral Q8H  . enoxaparin (LOVENOX) injection  80 mg Subcutaneous Q12H  . furosemide  80 mg Oral Daily  . insulin aspart  0-9 Units Subcutaneous TID WC  . lisinopril  5 mg Oral Daily  . metFORMIN  1,000 mg Oral BID WC  . potassium chloride  20 mEq Oral BID  . spironolactone  25 mg Oral Daily  . Warfarin - Pharmacist Dosing Inpatient   Does not apply q1800    Assessment/Plan: Atrial fibrillation: Heart rate not yet adequately controlled ; dose of AV nodal blocking agents will be adjusted. Anticoagulation in progress. Due to long-standing severe cardiomyopathy, [name] may require maintenance of sinus rhythm, but that likely will be determined as an outpatient.  She has considerable risk for a cardiac thrombotic or embolic event and will require long-term anticoagulation.  Hypertension: Blood pressure has been normal since admission and as an outpatient with current therapy.  Cardiomyopathy: Patient has had severely depressed ejection fraction for many years, which she has tolerated well and which has not progressed. Very high BNP level on admission may have been secondary to atrial fibrillation  with a rapid ventricular response. Diuresis can be deferred for now.  LOS: 4 days   [name] 08/06/2012, 3:38 PM

## 2012-08-06 NOTE — Progress Notes (Signed)
[name], [name] NO.:  [id number]  MEDICAL RECORD NO.:  [id number]  LOCATION:  IC01                          FACILITY:  [facility]  PHYSICIAN:  Mila Homer. [name], M.D.DATE OF BIRTH:  1945/07/16  DATE OF PROCEDURE: DATE OF DISCHARGE:                                PROGRESS NOTE   SUBJECTIVE:  The patient feels fairly well, although felt a little short of breath earlier.  OBJECTIVE:  She is still in the ICU, sitting up in a chair.  Her daughter is with her. VITAL SIGNS:  Temperature is 97.7, pulse 63, respiratory rate 15, blood pressure 102/59, O2 saturation is 93%. GENERAL:  She is well-developed, well-nourished.  Her speech is normal. Her sensorium is intact.  Her affect is good.  Her color is good. LUNGS:  Clear throughout.  She is moving air well. HEART:  Irregular irregularity.  Rate of about 70.  ASSESSMENT: 1. Atrial fibrillation with rapid ventricular response, controlled. 2. Congestive heart failure. 3. Nonischemic cardiomyopathy. 4. Type 2 diabetes. 5. Benign essential hypertension.  PLAN:  I reviewed her cardiac echo with her.  It was at least as good as it was several years ago, and possibly a little bit better.  Cardiology wants to have her on Xarelto, but it may be financially more acceptable to use warfarin.  I discussed this with her.  Will have warfarin anticoagulation with Lovenox for bridging.  Plan to get her home as soon as this can be done.     Mila Homer. [name], M.D.     SDK/MEDQ  D:  08/06/2012  T:  08/06/2012  Job:  [id number]

## 2012-08-06 NOTE — Progress Notes (Signed)
Patient watched instructional videos on coumadin and blood thinners.  Given coumadin booklet and reviewed.

## 2012-08-07 LAB — GLUCOSE, CAPILLARY
Glucose-Capillary: 142 mg/dL — ABNORMAL HIGH (ref 70–99)
Glucose-Capillary: 151 mg/dL — ABNORMAL HIGH (ref 70–99)
Glucose-Capillary: 113 mg/dL — ABNORMAL HIGH (ref 70–99)

## 2012-08-07 LAB — CBC
HCT: 36.5 % (ref 36.0–46.0)
Hemoglobin: 12.3 g/dL (ref 12.0–15.0)
MCH: 31.5 pg (ref 26.0–34.0)
MCHC: 33.7 g/dL (ref 30.0–36.0)
MCV: 93.4 fL (ref 78.0–100.0)
RBC: 3.91 MIL/uL (ref 3.87–5.11)

## 2012-08-07 LAB — PROTIME-INR: Prothrombin Time: 14.4 seconds (ref 11.6–15.2)

## 2012-08-07 MED ORDER — AMOXICILLIN 250 MG PO CAPS
500.0000 mg | ORAL_CAPSULE | Freq: Three times a day (TID) | ORAL | Status: DC
  Administered 2012-08-07 – 2012-08-09 (×7): 500 mg via ORAL
  Filled 2012-08-07 (×7): qty 2

## 2012-08-07 MED ORDER — WARFARIN SODIUM 7.5 MG PO TABS
7.5000 mg | ORAL_TABLET | Freq: Once | ORAL | Status: AC
  Administered 2012-08-07: 7.5 mg via ORAL
  Filled 2012-08-07: qty 1

## 2012-08-07 NOTE — Progress Notes (Signed)
[name], [name] NO.:  [id number]  MEDICAL RECORD NO.:  [id number]  LOCATION:  A317                          FACILITY:  [facility]  PHYSICIAN:  Mila Homer. [name], M.D.DATE OF BIRTH:  [birth year]  DATE OF PROCEDURE: DATE OF DISCHARGE:                                PROGRESS NOTE   SUBJECTIVE:  She generally feels well.  She has not had chest pain, chest pressure, or palpitation.  She is still having back pain.  She is concerned about the possibility that she might have a urinary tract infection.  Cultures were pending as of yesterday.  OBJECTIVE:  VITAL SIGNS:  Temperature is 97.7, pulse 121 but has been as low as 36 by the record, the respiratory is rate 20, blood pressure 100/67.  Careful review of the report showed that most of her heart rate monitoring is in the 60-80 beats per minute range. GENERAL:  She is supine in bed.  She is well developed, well nourished, in no acute distress.  Her speech is normal and sensorium intact. HEART:  Has an irregular irregularity.  Rate of about 70. LUNGS:  Clear throughout.  She is moving air well.  Her urine did grow greater than 100,000 E coli sensitive to everything tested.  ASSESSMENT: 1. Atrial fibrillation with rapid ventricular response currently with     rate control. 2. Long-standing congestive heart failure. 3. Nonischemic cardiomyopathy. 4. Type 2 diabetes. 5. Escherichia coli urinary tract infection. 6. Benign essential hypertension.  PLAN:  I have reviewed the note from Dr. Dietrich Pates, her cardiologist.  I am starting her on amoxicillin 500 mg t.i.d. for her UTI.  I discussed her case with pharmacy yesterday.  We stopped her expensive anticoagulants and switched to warfarin, and she has now seen the warfarin video.  Since her husband is also on this drug, she understands the need for close monitoring.  I noted that she did have a bit of red blood on the stool yesterday after a hard bowel movement,  but this is felt to be secondary to hemorrhoidal bleeding, but we will keep a track on this.     Mila Homer. [name], M.D.     SDK/MEDQ  D:  08/07/2012  T:  08/07/2012  Job:  [id number]

## 2012-08-07 NOTE — Progress Notes (Signed)
ANTICOAGULATION CONSULT NOTE  Pharmacy Consult for Lovenox and Coumadin Indication: atrial fibrillation  No Known Allergies Patient Measurements: Height: 5\' 5"  (165.1 cm) Weight: 170 lb 13.7 oz (77.5 kg) IBW/kg (Calculated) : 57 Vital Signs: Temp: 97.7 F (36.5 C) (02/15 0525) BP: 100/67 mmHg (02/15 0525) Pulse Rate: 121 (02/15 0525) Labs:  Recent Labs  08/05/12 0422 08/05/12 0500 08/06/12 1108 08/07/12 0615  HGB 11.1*  --   --  12.3  HCT 33.4*  --   --  36.5  PLT 197  --   --  248  LABPROT  --   --  14.6 14.4  INR  --   --  1.16 1.14  CREATININE  --  1.29*  --   --    Estimated Creatinine Clearance: 43 ml/min (by C-G formula based on Cr of 1.29).  Medical History: Past Medical History  Diagnosis Date  . Cardiomyopathy, nonischemic 10/07    EF 10% in 03/2006, 35% in 10/08 and 10-15% in 10/09 normal coronary angiography in 1999  . 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 Pulmonary embolism 2/07    after total right hip arthroplasty  . Hilar density     infrahilar mass/adenopathy on CT scan 5/07; subsequently  resolved  . Diabetes mellitus, type 2   . LBBB (left bundle branch block)   . GERD (gastroesophageal reflux disease)   . Hyperlipidemia   . Hypertension   . Tobacco abuse     discontinued in 1997, and then resumed  . Urinary incontinence   . Pneumonia     h/o pleural effusion  . Anemia     mild and chronic  . Obstructive sleep apnea     mild-did not tolerate CPAP  . Degenerative joint disease     of knees, shoulder, and hips  . 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 Chronic kidney disease     creatinin-1.44 in 1/09; 1.51 in 1/10   Medications:  Scheduled:  . atorvastatin  20 mg Oral q1800  . carvedilol  50 mg Oral BID WC  . diltiazem  30 mg Oral Q8H  . enoxaparin (LOVENOX) injection  80 mg Subcutaneous Q12H  . furosemide  80 mg Oral Daily  . insulin  aspart  0-5 Units Subcutaneous QHS  . insulin aspart  0-9 Units Subcutaneous TID WC  . lisinopril  5 mg Oral Daily  . metFORMIN  1,000 mg Oral BID WC  . off the beat book   Does not apply Once  . [COMPLETED] patient's guide to using coumadin book   Does not apply Once  . potassium chloride  20 mEq Oral BID  . spironolactone  25 mg Oral Daily  . [COMPLETED] warfarin  10 mg Oral ONCE-1800  . [COMPLETED] warfarin   Does not apply Once  . Warfarin - Pharmacist Dosing Inpatient   Does not apply q1800  . [DISCONTINUED] carvedilol  25 mg Oral BID WC  . [DISCONTINUED] rivaroxaban  15 mg Oral Q supper    Assessment: 67yo female who was  switched to Coumadin with Lovenox bridging until INR >/= 2.  Pt has h/o PE after ortho surgery. Goal of Therapy:  INR 2-3 Monitor platelets by anticoagulation protocol: Yes   Plan: Lovenox 80mg  SQ q12hrs (1mg /Kg per dose) Coumadin 7.5 mg today x 1 INR daily Complete Coumadin education. Mady Gemma 08/07/2012,8:34 AM

## 2012-08-08 LAB — GLUCOSE, CAPILLARY
Glucose-Capillary: 115 mg/dL — ABNORMAL HIGH (ref 70–99)
Glucose-Capillary: 124 mg/dL — ABNORMAL HIGH (ref 70–99)
Glucose-Capillary: 137 mg/dL — ABNORMAL HIGH (ref 70–99)

## 2012-08-08 MED ORDER — DILTIAZEM HCL ER COATED BEADS 120 MG PO CP24
120.0000 mg | ORAL_CAPSULE | Freq: Every day | ORAL | Status: DC
  Administered 2012-08-08 – 2012-08-09 (×2): 120 mg via ORAL
  Filled 2012-08-08 (×2): qty 1

## 2012-08-08 MED ORDER — WARFARIN SODIUM 5 MG PO TABS
5.0000 mg | ORAL_TABLET | Freq: Once | ORAL | Status: AC
  Administered 2012-08-08: 5 mg via ORAL
  Filled 2012-08-08: qty 1

## 2012-08-08 NOTE — Progress Notes (Signed)
ANTICOAGULATION CONSULT NOTE  Pharmacy Consult for Lovenox and Coumadin Indication: atrial fibrillation  No Known Allergies Patient Measurements: Height: 5\' 5"  (165.1 cm) Weight: 172 lb (78.019 kg) IBW/kg (Calculated) : 57 Vital Signs: Temp: 98 F (36.7 C) (02/16 0659) Temp src: Oral (02/16 0659) BP: 108/67 mmHg (02/16 0659) Pulse Rate: 71 (02/16 0659) Labs:  Recent Labs  08/06/12 1108 08/07/12 0615 08/08/12 0614  HGB  --  12.3  --   HCT  --  36.5  --   PLT  --  248  --   LABPROT 14.6 14.4 20.4*  INR 1.16 1.14 1.82*   Estimated Creatinine Clearance: 43.1 ml/min (by C-G formula based on Cr of 1.29).  Medical History: Past Medical History  Diagnosis Date  . Cardiomyopathy, nonischemic 10/07    EF 10% in 03/2006, 35% in 10/08 and 10-15% in 10/09 normal coronary angiography in 1999  . 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 Pulmonary embolism 2/07    after total right hip arthroplasty  . Hilar density     infrahilar mass/adenopathy on CT scan 5/07; subsequently  resolved  . Diabetes mellitus, type 2   . LBBB (left bundle branch block)   . GERD (gastroesophageal reflux disease)   . Hyperlipidemia   . Hypertension   . Tobacco abuse     discontinued in 1997, and then resumed  . Urinary incontinence   . Pneumonia     h/o pleural effusion  . Anemia     mild and chronic  . Obstructive sleep apnea     mild-did not tolerate CPAP  . Degenerative joint disease     of knees, shoulder, and hips  . 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 Chronic kidney disease     creatinin-1.44 in 1/09; 1.51 in 1/10   Medications:  Scheduled:  . amoxicillin  500 mg Oral Q8H  . atorvastatin  20 mg Oral q1800  . carvedilol  50 mg Oral BID WC  . diltiazem  30 mg Oral Q8H  . enoxaparin (LOVENOX) injection  80 mg Subcutaneous Q12H  . furosemide  80 mg Oral Daily  . insulin aspart  0-5 Units  Subcutaneous QHS  . insulin aspart  0-9 Units Subcutaneous TID WC  . lisinopril  5 mg Oral Daily  . metFORMIN  1,000 mg Oral BID WC  . off the beat book   Does not apply Once  . potassium chloride  20 mEq Oral BID  . spironolactone  25 mg Oral Daily  . [COMPLETED] warfarin  7.5 mg Oral ONCE-1800  . Warfarin - Pharmacist Dosing Inpatient   Does not apply q1800    Assessment: 67yo female who was  switched to Coumadin with Lovenox bridging until INR >/= 2.  Pt has h/o PE after ortho surgery. Goal of Therapy:  INR 2-3 Monitor platelets by anticoagulation protocol: Yes Recent bleeding per rectum secondary hemorrhoids per MD.   Plan: Continue Lovenox 80mg  SQ q12hrs (1mg /Kg per dose) Coumadin 5 mg today x 1 INR daily Completed Coumadin Education. Mady Gemma 08/08/2012,9:36 AM

## 2012-08-08 NOTE — Progress Notes (Signed)
[name], [name] NO.:  [id number]  MEDICAL RECORD NO.:  [id number]  LOCATION:  A317                          FACILITY:  [facility]  PHYSICIAN:  Mila Homer. [name], M.D.DATE OF BIRTH:  1945-09-25  DATE OF PROCEDURE: DATE OF DISCHARGE:                                PROGRESS NOTE   SUBJECTIVE:  She is generally doing well.  She has had no chest pain or pressure or shortness of breath.  She has had no palpitations.  OBJECTIVE:  VITAL SIGNS:  Temperature is 98 degrees, pulse 71, respiratory rate 20, blood pressure 108/67, O2 sats 95%. GENERAL:  She is supine in bed.  Multiple family members are in the room with her.  On this Sunday including her husband, sons and daughters, and assorted spouses or girlfriends.  Her color is good.  Her speech is normal.  Her sensorium is intact. HEART:  Irregular irregularity, rate of about 70. LUNGS:  Her lungs appear to be clear throughout.  She is moving air well. EXTREMITIES:  There is no edema of the ankles.  Her INR is now up to 1.82.  ASSESSMENT: 1. Atrial fibrillation with rapid ventricular response, currently with     rate control. 2. Longstanding congestive heart failure. 3. Nonischemic cardiomyopathy. 4. Type 2 diabetes. 5. Escherichia coli urinary tract infection. 6. Benign essential hypertension.  PLAN:  Continue with warfarin.  Hopefully tomorrow her INR will be greater than 2.0 and she will be able to be discharged home.  She is currently on diltiazem 30 mg q.8 hours and we will switch to diltiazem ER 120 mg daily.  She will need to be up and around with nursing assistance.     Mila Homer. [name], M.D.     SDK/MEDQ  D:  08/08/2012  T:  08/08/2012  Job:  [id number]

## 2012-08-09 LAB — GLUCOSE, CAPILLARY: Glucose-Capillary: 146 mg/dL — ABNORMAL HIGH (ref 70–99)

## 2012-08-09 LAB — CBC
HCT: 37.6 % (ref 36.0–46.0)
Hemoglobin: 12.6 g/dL (ref 12.0–15.0)
MCHC: 33.5 g/dL (ref 30.0–36.0)
MCV: 93.3 fL (ref 78.0–100.0)
RDW: 12.9 % (ref 11.5–15.5)
WBC: 6.2 10*3/uL (ref 4.0–10.5)

## 2012-08-09 LAB — PROTIME-INR
INR: 2.47 — ABNORMAL HIGH (ref 0.00–1.49)
Prothrombin Time: 25.6 s — ABNORMAL HIGH (ref 11.6–15.2)

## 2012-08-09 MED ORDER — DILTIAZEM HCL ER COATED BEADS 120 MG PO CP24: 120.0000 mg | ORAL_CAPSULE | Freq: Every day | ORAL | Status: DC

## 2012-08-09 MED ORDER — AMOXICILLIN 500 MG PO CAPS: 500.0000 mg | ORAL_CAPSULE | Freq: Three times a day (TID) | ORAL | Status: DC

## 2012-08-09 MED ORDER — WARFARIN SODIUM 5 MG PO TABS: 5.0000 mg | ORAL_TABLET | Freq: Every day | ORAL | Status: DC

## 2012-08-09 NOTE — Progress Notes (Signed)
SUBJECTIVE:Feeling very well. Wants to go home.  Active Problems:   HYPERTENSION   Cardiomyopathy, nonischemic   Diabetes mellitus, type 2   GERD (gastroesophageal reflux disease)   Degenerative joint disease   New onset a-fib   Chest tightness or pressure   Systolic HF (heart failure)  CBC:  Recent Labs  08/07/12 0615 08/09/12 0522  WBC 9.2 6.2  HGB 12.3 12.6  HCT 36.5 37.6  MCV 93.4 93.3  PLT 248 234   PHYSICAL EXAM BP 92/50  Pulse 74  Temp(Src) 97.6 F (36.4 C) (Oral)  Resp 20  Ht 5\' 5"  (1.651 m)  Wt 167 lb 3.2 oz (75.841 kg)  BMI 27.82 kg/m2  SpO2 98% General: Well developed, well nourished, in no acute distress Head: Eyes PERRLA, No xanthomas.   Normal cephalic and atramatic  Lungs: Clear bilaterally to auscultation and percussion. Heart: HRIR S1 S2, No MRG .  Pulses are 2+ & equal.            No carotid bruit. No JVD.  No abdominal bruits. No femoral bruits. Abdomen: Bowel sounds are positive, abdomen soft and non-tender without masses or                  Hernia's noted. Msk:  Back normal, normal gait. Normal strength and tone for age. Extremities: No clubbing, cyanosis or edema.  DP +1 Neuro: Alert and oriented X 3. Psych:  Good affect, responds appropriately  TELEMETRY: Reviewed telemetry pt in: Atrial flutter with 2:1, 3:1 :conduction. Rate of 78 bpm  ASSESSMENT AND PLAN:  1. New Onset Atrial Fibrillation: Heart rate is controlled, now atrial flutter vs coarse atrial fib. Some intermittent slowing at times on review of telemetry. HR avg in the 70's on Cardiazem.. She continues on coumadin with INR of 2.47. She wishes to follow with Dr. [name] for labs and dosing. We will make follow up appointment in our office, planned for March 3rd, 2:20pm.  2.  Hypertension: BP low normal running in the 80's-90's systolic with long acting Cardizem.at 120 mg daily. Will need to have close follow up for continued evaluation of her status.  3. Chronic Systolic  Dysfunction: EF of 20% per echo on 08/03/2012. She is on coreg 50 mg BID, Lisinopril 5 mg , spironolactone, and lasix 80 mg BID. Consider ICD pacemaker if systolic function dose not improve with medical management.   Bettey Mare. Lyman Bishop NP [facility] 08/09/2012, 8:24 AM  Cardiology Attending Discussed with Joni Reining, NP.  Above note annotated and modified based upon my findings.  Ms. Demos has done amazingly well with long-standing severe left ventricular dysfunction. There is virtually no possibility that LV systolic function will improve at this point. She previously suffered a near fatal infection of an AICD and neither she nor I wouldn't be enthusiastic about implantation of another device, especially in the absence of a history of ventricular arrhythmias over the past 7 years.  Atrial arrhythmias may impair her otherwise compensated state, and maintenance of sinus rhythm may be necessary. This will be evaluated in subsequent outpatient followup.  [place] Bing, MD 08/12/2012, 10:15 AM

## 2012-08-09 NOTE — Discharge Summary (Signed)
[name], [name] NO.:  [id number]  MEDICAL RECORD NO.:  [id number]  LOCATION:  A317                          FACILITY:  [facility]  PHYSICIAN:  Mila Homer. [name], M.D.DATE OF BIRTH:  [birth year]  DATE OF ADMISSION:  08/02/2012 DATE OF DISCHARGE:  02/17/2014LH                              DISCHARGE SUMMARY   This 67 year old woman was admitted to the hospital with severe weakness which turned out to be secondary to a combination of atrial fibrillation with rapid ventricular response and congestive heart failure.  She had a fairly benign 7-day hospital course extending from February 11 to August 09, 2012.  Her vitals were stable.  Sugars ran in the low 100 range while she was in the hospital.  Her admission BUN was 31 with creatinine 1.33, calcium 10.6, and her white cells 10,100.  Her troponins were less than 0.30.  She had a number of re-checks on her CBC and BMP, the last BMP showing a BUN of 40, creatinine 1.29.  Her MRSA by PCR was negative.  Her urine and C and S grew out greater 100,000 colonies of E coli sensitive to everything tested.  Admission chest x-ray showed bibasilar opacities unchanged.  She had mild cardiomegaly.  Twelve-lead EKG showed atrial fibrillation, rapid ventricular response, left bundle branch block.  Her cardiac echo showed normal left ventricle cavity size and mild LVH with an estimated ejection fraction of 20% and severe diffuse hypokinesis.  She had trivial regurgitation of the aortic valve, mild-to-moderate regurgitation from the mitral valve, severe dilatation of the left atrium, mild regurgitation from the tricuspid valve.  She was admitted to the hospital and treated with Cardizem to bring her rate down.  She was seen by [facility] in consultation.  Eventually, she was put on diltiazem 30 mg q.8 hours for rate control. This did well and then she was put on her last day diltiazem extended release 120 mg  daily.  She was also on Lovenox for anticoagulation.  Xarelto was tried, but then it was realized that the expense of the drug might be too much, so she was instead put on warfarin and discharged on warfarin after watching the warfarin video.  The patient's husband is also on the same drug and she understands the necessity of close attention to INR values.  She was discharged home on a number of medications including:  1. Carvedilol 25 mg b.i.d. 2. Tylenol PM q.h.s. for sleep. 3. Furosemide 80 mg 1 tablet in the a.m. and half tablet in the p.m. 4. Hydrocodone/APAP 10/325 t.i.d. for arthritic pains. 5. Lisinopril 10 mg daily. 6. Metformin 1000 mg b.i.d. 7. Polyethylene glycol 17 g in water daily. 8. Pravastatin 40 mg daily. 9. Acetaminophen p.r.n. 10.Amoxicillin 500 mg t.i.d. for a 10-day course. 11.She will also be on warfarin 5 mg daily.  In the hospital she was     on 10 mg the first day, 7.5 mg the second day, 5 mg the third day.     Pharmacy followed this.  She will need another INR tomorrow as an     outpatient. 12.She will also be on diltiazem 120 mg daily.  Followup  will be in our office within the week and in the office of [facility] in the near future.  FINAL DISCHARGE DIAGNOSES: 1. Atrial fibrillation with rapid ventricular response. 2. Escherichia coli urinary tract infection. 3. Congestive heart failure. 4. Nonischemic cardiomyopathy. 5. Type 2 diabetes. 6. Benign essential hypertension. 7. Anticoagulation  On outpatient treatment I am going to discuss with her again going on insulin and getting off metformin due to her congestive heart failure.     Mila Homer. [name], M.D.     SDK/MEDQ  D:  08/09/2012  T:  08/09/2012  Job:  [id number]

## 2012-08-18 ENCOUNTER — Encounter (HOSPITAL_COMMUNITY): Payer: Self-pay | Admitting: Emergency Medicine

## 2012-08-18 ENCOUNTER — Inpatient Hospital Stay (HOSPITAL_COMMUNITY)
Admission: EM | Admit: 2012-08-18 | Discharge: 2012-08-22 | DRG: 309 | Disposition: A | Discharge status: Home / Self Care | Payer: Medicare Other | Attending: Family Medicine | Admitting: Family Medicine

## 2012-08-18 ENCOUNTER — Emergency Department (HOSPITAL_COMMUNITY): Payer: Medicare Other

## 2012-08-18 DIAGNOSIS — Z86711 Personal history of pulmonary embolism: Secondary | ICD-10-CM

## 2012-08-18 DIAGNOSIS — E876 Hypokalemia: Secondary | ICD-10-CM | POA: Diagnosis present

## 2012-08-18 DIAGNOSIS — M19019 Primary osteoarthritis, unspecified shoulder: Secondary | ICD-10-CM | POA: Diagnosis present

## 2012-08-18 DIAGNOSIS — D649 Anemia, unspecified: Secondary | ICD-10-CM | POA: Diagnosis present

## 2012-08-18 DIAGNOSIS — I5022 Chronic systolic (congestive) heart failure: Secondary | ICD-10-CM | POA: Diagnosis present

## 2012-08-18 DIAGNOSIS — E119 Type 2 diabetes mellitus without complications: Secondary | ICD-10-CM | POA: Diagnosis present

## 2012-08-18 DIAGNOSIS — N189 Chronic kidney disease, unspecified: Secondary | ICD-10-CM | POA: Diagnosis present

## 2012-08-18 DIAGNOSIS — I509 Heart failure, unspecified: Secondary | ICD-10-CM | POA: Diagnosis present

## 2012-08-18 DIAGNOSIS — E785 Hyperlipidemia, unspecified: Secondary | ICD-10-CM | POA: Diagnosis present

## 2012-08-18 DIAGNOSIS — I4891 Unspecified atrial fibrillation: Principal | ICD-10-CM | POA: Diagnosis present

## 2012-08-18 DIAGNOSIS — Z79899 Other long term (current) drug therapy: Secondary | ICD-10-CM

## 2012-08-18 DIAGNOSIS — I5042 Chronic combined systolic (congestive) and diastolic (congestive) heart failure: Secondary | ICD-10-CM | POA: Diagnosis present

## 2012-08-18 DIAGNOSIS — M1610 Unilateral primary osteoarthritis, unspecified hip: Secondary | ICD-10-CM | POA: Diagnosis present

## 2012-08-18 DIAGNOSIS — I447 Left bundle-branch block, unspecified: Secondary | ICD-10-CM | POA: Diagnosis present

## 2012-08-18 DIAGNOSIS — Z7901 Long term (current) use of anticoagulants: Secondary | ICD-10-CM

## 2012-08-18 DIAGNOSIS — I4819 Other persistent atrial fibrillation: Secondary | ICD-10-CM | POA: Diagnosis present

## 2012-08-18 DIAGNOSIS — I428 Other cardiomyopathies: Secondary | ICD-10-CM | POA: Diagnosis present

## 2012-08-18 DIAGNOSIS — I5020 Unspecified systolic (congestive) heart failure: Secondary | ICD-10-CM

## 2012-08-18 DIAGNOSIS — G4733 Obstructive sleep apnea (adult) (pediatric): Secondary | ICD-10-CM | POA: Diagnosis present

## 2012-08-18 DIAGNOSIS — Z95810 Presence of automatic (implantable) cardiac defibrillator: Secondary | ICD-10-CM

## 2012-08-18 DIAGNOSIS — R0789 Other chest pain: Secondary | ICD-10-CM

## 2012-08-18 DIAGNOSIS — N179 Acute kidney failure, unspecified: Secondary | ICD-10-CM | POA: Diagnosis present

## 2012-08-18 DIAGNOSIS — IMO0002 Reserved for concepts with insufficient information to code with codable children: Secondary | ICD-10-CM | POA: Diagnosis present

## 2012-08-18 DIAGNOSIS — I129 Hypertensive chronic kidney disease with stage 1 through stage 4 chronic kidney disease, or unspecified chronic kidney disease: Secondary | ICD-10-CM | POA: Diagnosis present

## 2012-08-18 DIAGNOSIS — N184 Chronic kidney disease, stage 4 (severe): Secondary | ICD-10-CM | POA: Diagnosis present

## 2012-08-18 DIAGNOSIS — K219 Gastro-esophageal reflux disease without esophagitis: Secondary | ICD-10-CM | POA: Diagnosis present

## 2012-08-18 DIAGNOSIS — N390 Urinary tract infection, site not specified: Secondary | ICD-10-CM | POA: Diagnosis present

## 2012-08-18 DIAGNOSIS — Z87891 Personal history of nicotine dependence: Secondary | ICD-10-CM

## 2012-08-18 LAB — BASIC METABOLIC PANEL
BUN: 61 mg/dL — ABNORMAL HIGH (ref 6–23)
CO2: 26 mEq/L (ref 19–32)
Chloride: 103 mEq/L (ref 96–112)
Glucose, Bld: 146 mg/dL — ABNORMAL HIGH (ref 70–99)
Potassium: 4.3 mEq/L (ref 3.5–5.1)
Sodium: 141 mEq/L (ref 135–145)

## 2012-08-18 LAB — GLUCOSE, CAPILLARY: Glucose-Capillary: 118 mg/dL — ABNORMAL HIGH (ref 70–99)

## 2012-08-18 LAB — CBC WITH DIFFERENTIAL/PLATELET
HCT: 36.2 % (ref 36.0–46.0)
Hemoglobin: 12.1 g/dL (ref 12.0–15.0)
Lymphs Abs: 2.3 10*3/uL (ref 0.7–4.0)
MCHC: 33.4 g/dL (ref 30.0–36.0)
Monocytes Absolute: 0.3 10*3/uL (ref 0.1–1.0)
Monocytes Relative: 4 % (ref 3–12)
Neutro Abs: 5.4 10*3/uL (ref 1.7–7.7)
Neutrophils Relative %: 67 % (ref 43–77)
RBC: 3.89 MIL/uL (ref 3.87–5.11)

## 2012-08-18 LAB — PROTIME-INR: INR: 6.12 (ref 0.00–1.49)

## 2012-08-18 LAB — MRSA PCR SCREENING: MRSA by PCR: NEGATIVE

## 2012-08-18 LAB — APTT: aPTT: 49 seconds — ABNORMAL HIGH (ref 24–37)

## 2012-08-18 MED ORDER — OXYCODONE HCL 5 MG PO TABS: 5.0000 mg | ORAL_TABLET | Freq: Four times a day (QID) | ORAL | Status: DC | PRN

## 2012-08-18 MED ORDER — DILTIAZEM HCL 50 MG/10ML IV SOLN
10.0000 mg | Freq: Once | INTRAVENOUS | Status: DC
  Filled 2012-08-18: qty 2

## 2012-08-18 MED ORDER — PANTOPRAZOLE SODIUM 40 MG PO TBEC
40.0000 mg | DELAYED_RELEASE_TABLET | Freq: Every day | ORAL | Status: DC
  Administered 2012-08-18 – 2012-08-22 (×5): 40 mg via ORAL
  Filled 2012-08-18 (×5): qty 1

## 2012-08-18 MED ORDER — DOBUTAMINE IN D5W 4-5 MG/ML-% IV SOLN
2.5000 ug/kg/min | INTRAVENOUS | Status: DC
  Administered 2012-08-18: 2.5 ug/kg/min via INTRAVENOUS
  Filled 2012-08-18: qty 250

## 2012-08-18 MED ORDER — AMOXICILLIN 250 MG PO CAPS
500.0000 mg | ORAL_CAPSULE | Freq: Three times a day (TID) | ORAL | Status: DC
  Administered 2012-08-18 – 2012-08-22 (×11): 500 mg via ORAL
  Filled 2012-08-18 (×9): qty 1
  Filled 2012-08-18: qty 2
  Filled 2012-08-18 (×4): qty 1
  Filled 2012-08-18: qty 2
  Filled 2012-08-18 (×2): qty 1

## 2012-08-18 MED ORDER — DIPHENHYDRAMINE HCL 25 MG PO CAPS
25.0000 mg | ORAL_CAPSULE | Freq: Every evening | ORAL | Status: DC | PRN
  Administered 2012-08-18 – 2012-08-21 (×4): 25 mg via ORAL
  Filled 2012-08-18 (×4): qty 1

## 2012-08-18 MED ORDER — HYDROCODONE-ACETAMINOPHEN 10-325 MG PO TABS
1.0000 | ORAL_TABLET | Freq: Three times a day (TID) | ORAL | Status: DC | PRN
  Administered 2012-08-18: 1 via ORAL
  Filled 2012-08-18: qty 1

## 2012-08-18 MED ORDER — ACETAMINOPHEN 500 MG PO TABS
500.0000 mg | ORAL_TABLET | Freq: Every evening | ORAL | Status: DC | PRN
  Administered 2012-08-20 – 2012-08-21 (×2): 500 mg via ORAL
  Filled 2012-08-18: qty 1

## 2012-08-18 MED ORDER — ACETAMINOPHEN 500 MG PO TABS
500.0000 mg | ORAL_TABLET | ORAL | Status: DC | PRN
  Filled 2012-08-18: qty 1

## 2012-08-18 MED ORDER — CARVEDILOL 12.5 MG PO TABS
25.0000 mg | ORAL_TABLET | Freq: Two times a day (BID) | ORAL | Status: DC
  Administered 2012-08-18 – 2012-08-22 (×8): 25 mg via ORAL
  Filled 2012-08-18 (×8): qty 2

## 2012-08-18 MED ORDER — DIPHENHYDRAMINE-APAP (SLEEP) 25-500 MG PO TABS: 1.0000 | ORAL_TABLET | Freq: Every evening | ORAL | Status: DC | PRN

## 2012-08-18 MED ORDER — ATORVASTATIN CALCIUM 20 MG PO TABS
20.0000 mg | ORAL_TABLET | Freq: Every day | ORAL | Status: DC
  Administered 2012-08-18 – 2012-08-21 (×4): 20 mg via ORAL
  Filled 2012-08-18 (×4): qty 1

## 2012-08-18 MED ORDER — SIMVASTATIN 5 MG PO TABS: 5.0000 mg | ORAL_TABLET | Freq: Every day | ORAL | Status: DC

## 2012-08-18 MED ORDER — OXYCODONE-ACETAMINOPHEN 5-325 MG PO TABS
1.0000 | ORAL_TABLET | Freq: Four times a day (QID) | ORAL | Status: DC | PRN
  Administered 2012-08-19: 1 via ORAL
  Filled 2012-08-18: qty 1

## 2012-08-18 MED ORDER — VITAMIN K1 10 MG/ML IJ SOLN
5.0000 mg | Freq: Once | INTRAMUSCULAR | Status: AC
  Administered 2012-08-18: 17:00:00 via SUBCUTANEOUS
  Filled 2012-08-18: qty 1

## 2012-08-18 MED ORDER — METOPROLOL TARTRATE 1 MG/ML IV SOLN: 5.0000 mg | Freq: Once | INTRAVENOUS | Status: DC

## 2012-08-18 MED ORDER — LISINOPRIL 5 MG PO TABS
5.0000 mg | ORAL_TABLET | Freq: Every day | ORAL | Status: DC
  Administered 2012-08-18 – 2012-08-22 (×5): 5 mg via ORAL
  Filled 2012-08-18 (×5): qty 1

## 2012-08-18 MED ORDER — FUROSEMIDE 10 MG/ML IJ SOLN
60.0000 mg | Freq: Two times a day (BID) | INTRAMUSCULAR | Status: DC
  Administered 2012-08-18 – 2012-08-20 (×4): 60 mg via INTRAVENOUS
  Filled 2012-08-18 (×2): qty 6
  Filled 2012-08-18: qty 4
  Filled 2012-08-18 (×2): qty 6
  Filled 2012-08-18: qty 2

## 2012-08-18 MED ORDER — SODIUM CHLORIDE 0.9 % IV SOLN
INTRAVENOUS | Status: AC
  Administered 2012-08-18: 18:00:00 via INTRAVENOUS

## 2012-08-18 MED ORDER — OXYCODONE-ACETAMINOPHEN 10-325 MG PO TABS: 1.0000 | ORAL_TABLET | Freq: Four times a day (QID) | ORAL | Status: DC | PRN

## 2012-08-18 MED ORDER — DILTIAZEM HCL 100 MG IV SOLR
5.0000 mg/h | INTRAVENOUS | Status: DC
  Administered 2012-08-18 – 2012-08-20 (×4): 5 mg/h via INTRAVENOUS
  Filled 2012-08-18: qty 100

## 2012-08-18 MED ORDER — INSULIN ASPART 100 UNIT/ML ~~LOC~~ SOLN
0.0000 [IU] | Freq: Three times a day (TID) | SUBCUTANEOUS | Status: DC
  Administered 2012-08-19 (×2): 2 [IU] via SUBCUTANEOUS
  Administered 2012-08-19 – 2012-08-20 (×2): 1 [IU] via SUBCUTANEOUS
  Administered 2012-08-20: 2 [IU] via SUBCUTANEOUS
  Administered 2012-08-21: 1 [IU] via SUBCUTANEOUS
  Administered 2012-08-21: 5 [IU] via SUBCUTANEOUS
  Administered 2012-08-22: 1 [IU] via SUBCUTANEOUS

## 2012-08-18 MED ORDER — INSULIN ASPART 100 UNIT/ML ~~LOC~~ SOLN
0.0000 [IU] | Freq: Every day | SUBCUTANEOUS | Status: DC
  Administered 2012-08-21: 2 [IU] via SUBCUTANEOUS

## 2012-08-18 NOTE — Consult Note (Signed)
Patient Name: [name]  MRN: [id number]  HPI: [name] is an 67 y.o. female referred for consultation by Dr. Donnetta Hutching for wide-complex tachycardia.  This nice woman has a long history of severe nonischemic cardiomyopathy and has recently developed atrial arrhythmias. She was hospitalized approximately 2 weeks ago with GI symptoms and atrial fibrillation with a rapid ventricular response. She now returns with chest tightness and persistent nausea with intermittent emesis and anorexia. Initial EKG showed wide-complex tachycardia. Comparison with prior EKGs shows no change in QRS morphology indicating a likely supraventricular tachycardia. With intravenous diltiazem and a single dose of intravenous beta blocker, heart rate has slowed in atrial fibrillation. Frequent ventricular ectopy is noted. There is been no change in patient's symptoms.  Past Medical History  Diagnosis Date  . Cardiomyopathy, nonischemic 10/07    EF 10% in 03/2006, 35% in 10/08 and 10-15% in 10/09 normal coronary angiography in 1999  . 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 Pulmonary embolism 2/07    after total right hip arthroplasty  . Hilar density     infrahilar mass/adenopathy on CT scan 5/07; subsequently  resolved  . Diabetes mellitus, type 2   . LBBB (left bundle branch block)   . GERD (gastroesophageal reflux disease)   . Hyperlipidemia   . Hypertension   . Tobacco abuse     discontinued in 1997, and then resumed  . Urinary incontinence   . Pneumonia     h/o pleural effusion  . Anemia     mild and chronic  . Obstructive sleep apnea     mild-did not tolerate CPAP  . Degenerative joint disease     of knees, shoulder, and hips  . 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 Chronic kidney disease     creatinin-1.44 in 1/09; 1.51 in 1/10    Past Surgical History  Procedure Laterality Date  . Pacemaker  removal  11/18/05    Enterococcal infection  . Total hip arthroplasty  2/07    Right  . Abdominal hysterectomy  1990/92    Initial partial hysterectomy followed by BSO  . Knee arthroscopy      Remote  . Colonoscopy w/ polypectomy  2009  . A-v cardiac pacemaker insertion  12/05    Biventricular pacemaker/AICD    Family History  Problem Relation Age of Onset  . Hypertension Mother   . Diabetes Mother   . Coronary artery disease Father   . Diabetes Brother   . Hypertension Brother   . Lung cancer Brother   . Arthritis Other   . Diabetes Other   . Heart disease Other     female < 30    Social History:  reports that she quit smoking about 5 years ago. Her smoking use included Cigarettes. She smoked 0.00 packs per day. She has never used smokeless tobacco. She reports that she does not drink alcohol or use illicit drugs.  Allergies: No Known Allergies  Medications:  I have reviewed the patient's current medications. Prior to Admission:  Prescriptions prior to admission  Medication Sig Dispense Refill  . amoxicillin (AMOXIL) 500 MG capsule Take 1 capsule (500 mg total) by mouth every 8 (eight) hours.  30 capsule  1  . carvedilol (COREG) 25 MG tablet Take 25 mg by mouth 2 (two) times daily.        Marland Kitchen diltiazem (CARDIZEM) 30 MG tablet  Take 30 mg by mouth 2 (two) times daily.      . diphenhydramine-acetaminophen (TYLENOL PM EXTRA STRENGTH) 25-500 MG TABS Take 1 tablet by mouth at bedtime as needed (for sleep).       . furosemide (LASIX) 80 MG tablet Take 80 mg by mouth 2 (two) times daily. 1 tab am. 1/2 tab pm      . HYDROcodone-acetaminophen (NORCO) 10-325 MG per tablet Take 1 tablet by mouth 3 (three) times daily as needed for pain (for pain).      Marland Kitchen lisinopril (PRINIVIL,ZESTRIL) 10 MG tablet Take 10 mg by mouth daily.      . metFORMIN (GLUCOPHAGE) 1000 MG tablet Take 1,000 mg by mouth 2 (two) times daily.        Marland Kitchen oxyCODONE-acetaminophen (PERCOCET) 10-325 MG per tablet Take 1 tablet  by mouth 4 (four) times daily as needed for pain. For severe breakthrough pain      . polyethylene glycol powder (GLYCOLAX/MIRALAX) powder Take 17 g by mouth daily as needed (constipation).       . pravastatin (PRAVACHOL) 40 MG tablet Take 80 mg by mouth at bedtime.       Marland Kitchen warfarin (COUMADIN) 5 MG tablet Take 2.5-5 mg by mouth daily. Patient alternates with 2.5mg  daily with 5mg  daily         08/18/12  3:51 PM      Result Value Range   WBC 8.1  4.0 - 10.5 K/uL   RBC 3.89  3.87 - 5.11 MIL/uL   Hemoglobin 12.1  12.0 - 15.0 g/dL   HCT 96.0  45.4 - 09.8 %   MCV 93.1  78.0 - 100.0 fL   MCH 31.1  26.0 - 34.0 pg   MCHC 33.4  30.0 - 36.0 g/dL   RDW 11.9  14.7 - 82.9 %   Platelets 222  150 - 400 K/uL  TROPONIN I     Status: None   Collection Time    08/18/12  3:51 PM      Result Value Range   Troponin I <0.30  <0.30 ng/mL  BASIC METABOLIC PANEL     Status: Abnormal    08/18/12  3:51 PM      Result Value Range   Sodium 141  135 - 145 mEq/L   Potassium 4.3  3.5 - 5.1 mEq/L   Chloride 103  96 - 112 mEq/L   CO2 26  19 - 32 mEq/L   Glucose, Bld 146 (*) 70 - 99 mg/dL   BUN 61 (*) 6 - 23 mg/dL   Creatinine, Ser 5.62 (*) 0.50 - 1.10 mg/dL   Calcium 13.0 (*) 8.4 - 10.5 mg/dL   GFR calc non Af Amer 27 (*) >90 mL/min   GFR calc Af Amer 31 (*) >90 mL/min  PROTIME-INR     Status: Abnormal   Collection Time    08/18/12  3:52 PM      Result Value Range   Prothrombin Time 50.1 (*) 11.6 - 15.2 seconds   INR 6.12 (*) 0.00 - 1.49   Dg Chest Portable 1 View  08/18/2012  *RADIOLOGY REPORT*  Clinical Data: Chest pain.  PORTABLE CHEST - 1 VIEW  Comparison: Plain film chest 08/02/2012 and 04/11/2012.  Findings: There is cardiomegaly without edema.  Lungs are clear. No pneumothorax or pleural fluid.  IMPRESSION: Cardiomegaly without acute disease.   Original Report Authenticated By: Holley Dexter, M.D.    Review of Systems: General: no anorexia, weight gain or weight loss  Cardiac: no chest pain,  dyspnea, orthopnea, PND,  or syncope Respiratory: no cough, sputum production or hemoptysis GI: no nausea, abdominal pain, emesis, diarrhea or constipation Integument: no significant lesions Neurologic: No muscle weakness or paralysis; no speech disturbance; no headache All other systems reviewed and are negative.  Physical Exam: Blood pressure 100/83, pulse 56, temperature 97.7 F (36.5 C), temperature source Oral, resp. rate 25, height 5\' 5"  (1.651 m), weight 76.4 kg (168 lb 6.9 oz), SpO2 100.00%.;  Body mass index is 28.03 kg/(m^2). General-Well-developed; no acute distress; chronically ill-appearing HEENT-[place]/AT; PERRL; EOM intact; conjunctiva and lids nl Neck-moderate JVD; no carotid bruits Endocrine-No thyromegaly Lungs-Clear lung fields; resonant percussion; normal I-to-E ratio Cardiovascular- laterally displaced PMI; normal S1 and S2; grade 3/6 systolic decrescendo murmur at the left sternal border; irregular rhythm Abdomen-BS normal; soft and non-tender without masses or organomegaly Musculoskeletal-No deformities, cyanosis or clubbing Neurologic-Nl cranial nerves; symmetric strength and tone Skin- pallor, cool; no significant lesions Extremities-Nl distal pulses; trace edema  Assessment/Plan:   Atrial fibrillation: Decompensation of CHF has deteriorated with the onset of arrhythmia. This may be related to suboptimal control of heart rate alone, but more than likely the near presence of arrhythmia is an important contributor to worsen hemodynamics and what appears to be low output CHF. Modest diuresis will likely be of benefit, but may further impair renal function. Low dose dobutamine will be added. If not tolerated, milrinone can be tried. DC cardioversion will likely be necessary and can be performed immediately if review of records reveals adequate and continuous anticoagulation for the past few weeks.  Chronic anticoagulation: INR is elevated, likely as the result of  concomitant congestive heart failure, low cardiac output and impaired hepatic function. Warfarin will be held for now.  Left bundle branch block: This is chronic and accounts for atrial fibrillation presenting as wide-complex tachycardia.  Acute renal failure: minimal chronic kidney disease. Cardiorenal syndrome is also contributing.  Initial treatment will be gentle diuresis and a positive inotropic agent. If renal function continues to deteriorate, and will be necessary to modify therapy.  Rockwell Bing, MD 08/18/2012, 7:28 PM

## 2012-08-18 NOTE — ED Notes (Signed)
edp in and aware of vs. Pt showing 132 with v tach on monitor. Pt still conscious/oriented.

## 2012-08-18 NOTE — ED Notes (Signed)
CRITICAL VALUE ALERT  Critical value received:  INR 6.12  Date of notification:  08/18/12  Time of notification:  1700  Critical value read back: yes  Nurse who received alert:  Garald Braver  MD notified (1st page):  cook  Time of first page:  1705  MD notified (2nd page):  Time of second page:  Responding MD:    Time MD responded:

## 2012-08-18 NOTE — ED Notes (Signed)
Pt c/o central chest aching with sob sudden started today. States was admitted 2 weeks ago for same and dx with A Fib. Pt alert/oreinted. Nondiaphoretic. Denies cough. Slight gen weakness observed.

## 2012-08-18 NOTE — ED Notes (Signed)
Pt complain of pain to right side of chest and belly pain. Dr. Dietrich Pates here to eval.

## 2012-08-18 NOTE — ED Provider Notes (Signed)
History    This chart was scribed for Donnetta Hutching, MD by Charolett Bumpers, ED Scribe. The patient was seen in room APA07/[facility]07. Patient's care was started at 1455.   CSN: [id number]  Arrival date & time 08/18/12  1451   First MD Initiated Contact with Patient 08/18/12 1455      Chief Complaint  Patient presents with  . Chest Pain    The history is provided by the patient. No language interpreter was used.   [name] is a 67 y.o. female who presents to the Emergency Department complaining of sudden onset, constant, moderate, right sided chest pain since this morning. She describes the pain as pressure. She has associated nausea, SOB. She was admitted 2 weeks ago with similar symptoms and dx with A-fib. She states that she hasn't felt at baseline since then. She saw Dr. [name] this morning and was told to come to ED for further evaluation. She had a defibrillator in the past and has been dx with a weak heart per husband. She reports exertional dyspnea for a while that has been gradually worsening. She has a h/o nonischemic cardiomyopathy, PE, Type II DM, Left bundle branch block, hyperlipidemia and HTN. She is on Coumadin. She stopped Lisinopril and Amoxil yesterday per Dr. [name]. Blood sugar was 165 when last checked by pt.   Past Medical History  Diagnosis Date  . Cardiomyopathy, nonischemic 10/07    EF 10% in 03/2006, 35% in 10/08 and 10-15% in 10/09 normal coronary angiography in 1999  . 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 Pulmonary embolism 2/07    after total right hip arthroplasty  . Hilar density     infrahilar mass/adenopathy on CT scan 5/07; subsequently  resolved  . Diabetes mellitus, type 2   . LBBB (left bundle branch block)   . GERD (gastroesophageal reflux disease)   . Hyperlipidemia   . Hypertension   . Tobacco abuse     discontinued in 1997, and then resumed  . Urinary incontinence   . Pneumonia     h/o pleural effusion  . Anemia      mild and chronic  . Obstructive sleep apnea     mild-did not tolerate CPAP  . Degenerative joint disease     of knees, shoulder, and hips  . 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 Chronic kidney disease     creatinin-1.44 in 1/09; 1.51 in 1/10    Past Surgical History  Procedure Laterality Date  . Pacemaker removal  11/18/05    Enterococcal infection  . Total hip arthroplasty  2/07    Right  . Abdominal hysterectomy  1990/92    Initial partial hysterectomy followed by BSO  . Knee arthroscopy      Remote  . Colonoscopy w/ polypectomy  2009  . A-v cardiac pacemaker insertion  12/05    Biventricular pacemaker/AICD    Family History  Problem Relation Age of Onset  . Hypertension Mother   . Diabetes Mother   . Coronary artery disease Father   . Diabetes Brother   . Hypertension Brother   . Lung cancer Brother   . Arthritis Other   . Diabetes Other   . Heart disease Other     female < 55    History  Substance Use Topics  . Smoking status: Former Smoker    Types: Cigarettes    Quit  date: 08/04/2007  . Smokeless tobacco: Never Used     Comment: 1 pack per month   . Alcohol Use: No    OB History   Grav Para Term Preterm Abortions TAB SAB Ect Mult Living                  Review of Systems A complete 10 system review of systems was obtained and all systems are negative except as noted in the HPI and PMH.   Allergies  Review of patient's allergies indicates no known allergies.  Home Medications   Current Outpatient Rx  Name  Route  Sig  Dispense  Refill  . amoxicillin (AMOXIL) 500 MG capsule   Oral   Take 1 capsule (500 mg total) by mouth every 8 (eight) hours.   30 capsule   1   . carvedilol (COREG) 25 MG tablet   Oral   Take 25 mg by mouth 2 (two) times daily.           Marland Kitchen diltiazem (CARDIZEM CD) 120 MG 24 hr capsule   Oral   Take 1 capsule (120 mg total) by mouth  daily.   30 capsule   11   . diphenhydramine-acetaminophen (TYLENOL PM EXTRA STRENGTH) 25-500 MG TABS   Oral   Take 1 tablet by mouth at bedtime as needed (for sleep).          . furosemide (LASIX) 80 MG tablet   Oral   Take 40-80 mg by mouth 2 (two) times daily. 1 tab am. 1/2 tab pm         . HYDROcodone-acetaminophen (NORCO) 10-325 MG per tablet   Oral   Take 1 tablet by mouth 3 (three) times daily as needed for pain (for pain).         Marland Kitchen lisinopril (PRINIVIL,ZESTRIL) 10 MG tablet   Oral   Take 10 mg by mouth daily.         . metFORMIN (GLUCOPHAGE) 1000 MG tablet   Oral   Take 1,000 mg by mouth 2 (two) times daily.           . polyethylene glycol powder (GLYCOLAX/MIRALAX) powder               . pravastatin (PRAVACHOL) 40 MG tablet   Oral   Take 40 mg by mouth 2 (two) times daily.          Marland Kitchen warfarin (COUMADIN) 5 MG tablet   Oral   Take 1 tablet (5 mg total) by mouth daily.   30 tablet   2     BP 124/94  Pulse 126  Resp 18  Ht 5\' 5"  (1.651 m)  Wt 168 lb (76.204 kg)  BMI 27.96 kg/m2  SpO2 98%  Physical Exam  Nursing note and vitals reviewed. Constitutional: She is oriented to person, place, and time. She appears well-developed and well-nourished.  HENT:  Head: Normocephalic and atraumatic.  Eyes: Conjunctivae and EOM are normal. Pupils are equal, round, and reactive to light.  Neck: Normal range of motion. Neck supple.  Cardiovascular: Normal heart sounds.  An irregularly irregular rhythm present. Tachycardia present.   Pulmonary/Chest: Effort normal and breath sounds normal.  Abdominal: Soft. Bowel sounds are normal.  Musculoskeletal: Normal range of motion.  Neurological: She is alert and oriented to person, place, and time.  Skin: Skin is warm and dry.  Psychiatric: She has a normal mood and affect.    ED Course  Procedures (including critical care time)  DIAGNOSTIC STUDIES: Oxygen Saturation is 98% on 2 L [place], normal by my  interpretation.    COORDINATION OF CARE:  15:10-Discussed planned course of treatment with the patient including blood work and a chest x-ray, who is agreeable at this time.   15:15-Spoke with Dr. Dietrich Pates, cardiologist, who advised Lopressor and Cardizem. Will consult Dr. [name] regarding pt's case.  15:30-Medication Orders: Metoprolol (Lopressor) injection 5 mg-once; Diltiazem (Cardizem) injection Soln 10 mg-once; Diltiazem (Cardizem) 100 mg in dextrose 5% 100 mL infusion.   15:35-Heart rate has slowed to 80-90 range. BP is 99/67. Ectopy still present.   Labs Reviewed - No data to display Dg Chest Portable 1 View  08/18/2012  *RADIOLOGY REPORT*  Clinical Data: Chest pain.  PORTABLE CHEST - 1 VIEW  Comparison: Plain film chest 08/02/2012 and 04/11/2012.  Findings: There is cardiomegaly without edema.  Lungs are clear. No pneumothorax or pleural fluid.  IMPRESSION: Cardiomegaly without acute disease.   Original Report Authenticated By: Holley Dexter, M.D.     Date: 08/18/2012  Rate: 137  Rhythm: indeterminate  QRS Axis: right  Intervals: normal  ST/T Wave abnormalities: normal  Conduction Disutrbances:left bundle branch block  Narrative Interpretation:   Old EKG Reviewed: changes noted   No diagnosis found. CRITICAL CARE Performed by: Donnetta Hutching   Total critical care time: 45  Critical care time was exclusive of separately billable procedures and treating other patients.  Critical care was necessary to treat or prevent imminent or life-threatening deterioration.  Critical care was time spent personally by me on the following activities: development of treatment plan with patient and/or surrogate as well as nursing, discussions with consultants, evaluation of patient's response to treatment, examination of patient, obtaining history from patient or surrogate, ordering and performing treatments and interventions, ordering and review of laboratory studies, ordering and review  of radiographic studies, pulse oximetry and re-evaluation of patient's condition.   MDM   Complex arrhythmia with components of ventricular tachycardia and atrial fibrillation.  Discussed with Dr. Dietrich Pates.   IV Lopressor, IV Cardizem administered. Rate has slowed to 80-100 range. Ectopy still noted. Admit to Dr. [name]   I personally performed the services described in this documentation, which was scribed in my presence. The recorded information has been reviewed and is accurate.       Donnetta Hutching, MD 08/18/12 [phone]

## 2012-08-19 LAB — BASIC METABOLIC PANEL
CO2: 28 mEq/L (ref 19–32)
Calcium: 9.9 mg/dL (ref 8.4–10.5)
Chloride: 103 mEq/L (ref 96–112)
Creatinine, Ser: 1.67 mg/dL — ABNORMAL HIGH (ref 0.50–1.10)
Glucose, Bld: 117 mg/dL — ABNORMAL HIGH (ref 70–99)

## 2012-08-19 LAB — GLUCOSE, CAPILLARY
Glucose-Capillary: 130 mg/dL — ABNORMAL HIGH (ref 70–99)
Glucose-Capillary: 164 mg/dL — ABNORMAL HIGH (ref 70–99)

## 2012-08-19 MED ORDER — SODIUM CHLORIDE 0.9 % IV SOLN: INTRAVENOUS | Status: DC

## 2012-08-19 MED ORDER — SODIUM CHLORIDE 0.9 % IJ SOLN
3.0000 mL | Freq: Two times a day (BID) | INTRAMUSCULAR | Status: DC
  Administered 2012-08-19 – 2012-08-21 (×4): 3 mL via INTRAVENOUS

## 2012-08-19 MED ORDER — AMOXICILLIN 500 MG PO CAPS
ORAL_CAPSULE | ORAL | Status: AC
  Filled 2012-08-19: qty 1

## 2012-08-19 MED ORDER — WARFARIN - PHARMACIST DOSING INPATIENT
Freq: Every day | Status: DC
  Administered 2012-08-21: 18:00:00

## 2012-08-19 MED ORDER — SODIUM CHLORIDE 0.9 % IV SOLN: 250.0000 mL | INTRAVENOUS | Status: DC

## 2012-08-19 MED ORDER — GLUCERNA SHAKE PO LIQD
237.0000 mL | Freq: Two times a day (BID) | ORAL | Status: DC
  Administered 2012-08-20 – 2012-08-21 (×3): 237 mL via ORAL

## 2012-08-19 MED ORDER — SODIUM CHLORIDE 0.9 % IJ SOLN: 3.0000 mL | INTRAMUSCULAR | Status: DC | PRN

## 2012-08-19 NOTE — Progress Notes (Signed)
UR Chart Review Completed  

## 2012-08-19 NOTE — Progress Notes (Signed)
ANTICOAGULATION CONSULT NOTE - Initial Consult  Pharmacy Consult for Coumadin Indication: atrial fibrillation  No Known Allergies  Patient Measurements: Height: 5\' 5"  (165.1 cm) Weight: 168 lb 6.9 oz (76.4 kg) IBW/kg (Calculated) : 57  Vital Signs: Temp: 97.7 F (36.5 C) (02/27 0400) Temp src: Oral (02/27 0400) BP: 88/65 mmHg (02/27 0615) Pulse Rate: 55 (02/27 0615)  Labs:  Recent Labs  08/18/12 1551 08/18/12 1552 08/19/12 0455  HGB 12.1  --   --   HCT 36.2  --   --   PLT 222  --   --   APTT  --  49*  --   LABPROT  --  50.1* 40.8*  INR  --  6.12* 4.64*  CREATININE 1.85*  --  1.67*  TROPONINI <0.30  --   --     Estimated Creatinine Clearance: 33 ml/min (by C-G formula based on Cr of 1.67).   Medical History: Past Medical History  Diagnosis Date  . Cardiomyopathy, nonischemic 10/07    EF 10% in 03/2006, 35% in 10/08 and 10-15% in 10/09 normal coronary angiography in 1999  . 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 Pulmonary embolism 2/07    after total right hip arthroplasty  . Hilar density     infrahilar mass/adenopathy on CT scan 5/07; subsequently  resolved  . Diabetes mellitus, type 2   . LBBB (left bundle branch block)   . GERD (gastroesophageal reflux disease)   . Hyperlipidemia   . Hypertension   . Tobacco abuse     discontinued in 1997, and then resumed  . Urinary incontinence   . Pneumonia     h/o pleural effusion  . Anemia     mild and chronic  . Obstructive sleep apnea     mild-did not tolerate CPAP  . Degenerative joint disease     of knees, shoulder, and hips  . 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 Chronic kidney disease     creatinin-1.44 in 1/09; 1.51 in 1/10    Medications:  Scheduled:  . [COMPLETED] sodium chloride   Intravenous STAT  . amoxicillin  500 mg Oral Q8H  . atorvastatin  20 mg Oral q1800  . carvedilol  25 mg Oral BID WC   . diltiazem  10 mg Intravenous Once  . furosemide  60 mg Intravenous Q12H  . insulin aspart  0-5 Units Subcutaneous QHS  . insulin aspart  0-9 Units Subcutaneous TID WC  . lisinopril  5 mg Oral Daily  . metoprolol  5 mg Intravenous Once  . pantoprazole  40 mg Oral Daily  . [COMPLETED] phytonadione  5 mg Subcutaneous Once  . [DISCONTINUED] simvastatin  5 mg Oral q1800    Assessment: 67 yo F on chronic warfarin 5mg  alternating with 2.5mg  for Afib.  INR therapeutic on admission likely secondary to acute HF exacerbation.  No bleeding noted.   Goal of Therapy:  INR 2-3   Plan:  Hold Coumadin until INR normalized Daily INR  Damonte Frieson, [facility] 08/19/2012,7:52 AM

## 2012-08-19 NOTE — Progress Notes (Signed)
[name]  67 y.o.  female  Subjective: Nausea has decreased; mild abdominal wall soreness persists. She notes no palpitations, dyspnea nor chest discomfort.  Allergy: Review of patient's allergies indicates no known allergies.  Objective: Vital signs in last 24 hours: Temp:  [97.5 F (36.4 C)-97.8 F (36.6 C)] 97.5 F (36.4 C) (02/27 0800) Pulse Rate:  [29-128] 62 (02/27 0700) Resp:  [9-25] 17 (02/27 0900) BP: (85-124)/(40-94) 97/63 mmHg (02/27 0900) SpO2:  [95 %-100 %] 97 % (02/27 0700) Weight:  [76.204 kg (168 lb)-76.4 kg (168 lb 6.9 oz)] 76.4 kg (168 lb 6.9 oz) (02/27 0500)  76.4 kg (168 lb 6.9 oz) Body mass index is 28.03 kg/(m^2).  Weight change:  Last BM Date: 08/18/12  Intake/Output from previous day: 02/26 0701 - 02/27 0700 In: 1000.7 [P.O.:240; I.V.:760.7] Out: - No urine output recorded since admission; no change in weight; patient and nurse reported incontinence precluding assessment of urine output  General- Well developed; no acute distress; appears fatigued; pallor improved; she is extremely weak, unable to arise to sitting position without assistance. Neck- moderate JVD, perhaps marginally improved, no carotid bruits Lungs- few coarse breath sounds at the bases; normal I:E ratio Cardiovascular- normal PMI; normal S1 and S2; irregular rhythm; grade 1/6 holosystolic decrescendo murmur at the left sternal border Abdomen- normal bowel sounds; soft and non-tender without masses or organomegaly Skin- Warm, no significant lesions Extremities- Nl distal pulses; no edema; no presacral edema  Cardiac Markers:   Recent Labs  08/18/12 1551  TROPONINI <0.30   CBC:   Recent Labs  08/18/12 1551  WBC 8.1  HGB 12.1  HCT 36.2  PLT 222   BMET:  Recent Labs  08/18/12 1551 08/19/12 0455  NA 141 141  K 4.3 3.8  CL 103 103  CO2 26 28  GLUCOSE 146* 117*  BUN 61* 54*  CREATININE 1.85* 1.67*  CALCIUM 10.7* 9.9   Lab Results  Component Value Date   INR 4.64*  08/19/2012   INR 6.12* 08/18/2012   INR 2.47* 08/09/2012   Telemetry: Atrial fibrillation with frequent ventricular ectopy; no sustained heart rate in excess of 90 bpm  Imaging Studies/Results: Dg Chest Portable 1 View  08/18/2012: cardiomegaly without edema.   Imaging: Imaging results have been reviewed  Medications:  I have reviewed the patient's current medications. Scheduled: . amoxicillin  500 mg Oral Q8H  . atorvastatin  20 mg Oral q1800  . carvedilol  25 mg Oral BID WC  . diltiazem  10 mg Intravenous Once  . furosemide  60 mg Intravenous Q12H  . insulin aspart  0-5 Units Subcutaneous QHS  . insulin aspart  0-9 Units Subcutaneous TID WC  . lisinopril  5 mg Oral Daily  . metoprolol  5 mg Intravenous Once  . pantoprazole  40 mg Oral Daily  . [START ON 08/20/2012] Warfarin - Pharmacist Dosing Inpatient   Does not apply q1800   Infusions:   . diltiazem (CARDIZEM) infusion 5 mg/hr (08/19/12 0900)  . DOBUTamine 2.5 mcg/kg/min (08/19/12 0900)    Assessment/Plan: There is been some improvement overnight, likely related to improve cardiac performance with low-dose dobutamine. She likely has not diuresed significantly, but fluid overload is modest, if present at all. We will plan to restore sinus rhythm, but transesophageal echocardiography will be required, as she has been treated with warfarin for only 2 weeks. This procedure will be scheduled for tomorrow. If renal function continues to improve, we can consider an increase in her dose  of furosemide. Blood pressure was fairly good last night, but is now sagging with systolics in the upper 80s preventing any increase in cardiac medication.  INR has decreased following parenteral vitamin K.  Pharmacy is now managing anticoagulation. If INR decreases below the therapeutic range, supplemental heparin treatment will be necessary.  LOS: 1 day   [place] Bing 08/19/2012, 11:06 AM

## 2012-08-19 NOTE — Progress Notes (Signed)
INITIAL NUTRITION ASSESSMENT  DOCUMENTATION CODES Per approved criteria  Non-Severe Malnutrition in the context of chronic illness   INTERVENTION: Glucerna Shake po BID, each supplement provides 220 kcal and 10 grams of protein.  NUTRITION DIAGNOSIS: Inadequate oral intake related to decreased appetite  as evidenced by meal intake <25% and unplanned wt loss 16#, 8% <90 days.   Goal: Pt to meet >/= 90% of their estimated nutrition needs; not met  Monitor:  Meal and supplement intake, I/O's, labs and wt trends  Reason for Assessment: Malnutrition Screen  67 y.o. female  Admitting Dx: Atrial fibrillation, CHF, Hypotension, Type 2 DM, Chronic kidney disease  ASSESSMENT: Pt recent hospitalized due to UTI, new onset atrial fibrillation. Her appetite and po intake has continued to be poor since previous admission reflected in pt additional wt loss and diet hx. She is "picking at her food" during visit and says she knows she needs to eat but isn't hungry. She is agreeable to try Glucerna to help improve nutrition intake.  Pt meets criteria for non-severe malnutrition in the context of chronic illness given her wt loss >7.5% in 3 months and energy intake <75% for >/= 1 month.   Height: Ht Readings from Last 1 Encounters:  08/18/12 5\' 5"  (1.651 m)    Weight: Wt Readings from Last 1 Encounters:  08/19/12 168 lb 6.9 oz (76.4 kg)    Ideal Body Weight: 125# (56.8 kg)  % Ideal Body Weight: 138%  Wt Readings from Last 10 Encounters:  08/19/12 168 lb 6.9 oz (76.4 kg)  08/09/12 167 lb 3.2 oz (75.841 kg)  05/10/12 184 lb (83.462 kg)  07/14/11 186 lb (84.369 kg)  01/14/11 190 lb (86.183 kg)  07/12/10 191 lb (86.637 kg)  01/04/10 192 lb (87.091 kg)  06/01/09 191 lb (86.637 kg)  01/29/09 194 lb (87.998 kg)  11/28/08 193 lb (87.544 kg)    Usual Body Weight: 184# (83.4 kg)  % Usual Body Weight: 92%  BMI:  Body mass index is 28.03 kg/(m^2). Overweight  Estimated Nutritional  Needs: Kcal: 1425-1710  Protein: 78-86 gr Fluid: 1 ml/kcal  Skin: no issues noted  Diet Order: Carb Control (1600-2000 kcal) po's 5% currently  EDUCATION NEEDS: -Education needs addressed   Intake/Output Summary (Last 24 hours) at 08/19/12 1447 Last data filed at 08/19/12 1300  Gross per 24 hour  Intake 1198.1 ml  Output      0 ml  Net 1198.1 ml    Last BM: 08/18/12  Labs:   Recent Labs Lab 08/18/12 1551 08/19/12 0455  NA 141 141  K 4.3 3.8  CL 103 103  CO2 26 28  BUN 61* 54*  CREATININE 1.85* 1.67*  CALCIUM 10.7* 9.9  GLUCOSE 146* 117*    CBG (last 3)   Recent Labs  08/18/12 2151 08/19/12 0717 08/19/12 1128  GLUCAP 118* 126* 130*    Scheduled Meds: . amoxicillin  500 mg Oral Q8H  . atorvastatin  20 mg Oral q1800  . carvedilol  25 mg Oral BID WC  . diltiazem  10 mg Intravenous Once  . furosemide  60 mg Intravenous Q12H  . insulin aspart  0-5 Units Subcutaneous QHS  . insulin aspart  0-9 Units Subcutaneous TID WC  . lisinopril  5 mg Oral Daily  . metoprolol  5 mg Intravenous Once  . pantoprazole  40 mg Oral Daily  . sodium chloride  3 mL Intravenous Q12H  . [START ON 08/20/2012] Warfarin - Pharmacist Dosing Inpatient  Does not apply q1800    Continuous Infusions: . sodium chloride    . sodium chloride    . diltiazem (CARDIZEM) infusion 5 mg/hr (08/19/12 1300)  . DOBUTamine 2.5 mcg/kg/min (08/19/12 1300)    Past Medical History  Diagnosis Date  . Cardiomyopathy, nonischemic 10/07    EF 10% in 03/2006, 35% in 10/08 and 10-15% in 10/09 normal coronary angiography in 1999  . 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 Pulmonary embolism 2/07    after total right hip arthroplasty  . Hilar density     infrahilar mass/adenopathy on CT scan 5/07; subsequently  resolved  . Diabetes mellitus, type 2   . LBBB (left bundle branch block)   . GERD (gastroesophageal reflux disease)   . Hyperlipidemia   . Hypertension   . Tobacco abuse      discontinued in 1997, and then resumed  . Urinary incontinence   . Pneumonia     h/o pleural effusion  . Anemia     mild and chronic  . Obstructive sleep apnea     mild-did not tolerate CPAP  . Degenerative joint disease     of knees, shoulder, and hips  . 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 Chronic kidney disease     creatinin-1.44 in 1/09; 1.51 in 1/10    Past Surgical History  Procedure Laterality Date  . Pacemaker removal  11/18/05    Enterococcal infection  . Total hip arthroplasty  2/07    Right  . Abdominal hysterectomy  1990/92    Initial partial hysterectomy followed by BSO  . Knee arthroscopy      Remote  . Colonoscopy w/ polypectomy  2009  . A-v cardiac pacemaker insertion  12/05    Biventricular pacemaker/AICD    [phone]

## 2012-08-19 NOTE — Progress Notes (Signed)
PT REMAINS OFF THE FLOOR IN [facility] DEPARTMENT

## 2012-08-19 NOTE — Clinical Documentation Improvement (Signed)
CHF DOCUMENTATION CLARIFICATION QUERY  THIS DOCUMENT IS NOT A PERMANENT PART OF THE MEDICAL RECORD  TO RESPOND TO THE THIS QUERY, FOLLOW THE INSTRUCTIONS BELOW:  1. If needed, update documentation for the patient's encounter via the notes activity.  2. Access this query again and click edit on the In [employer].  3. After updating, or not, click F2 to complete all highlighted (required) fields concerning your review. Select "additional documentation in the medical record" OR "no additional documentation provided".  4. Click Sign note button.  5. The deficiency will fall out of your In Basket *Please let us know if you are not able to complete this workflow by phone or e-mail (listed below).  Please update your documentation within the medical record to reflect your response to this query.                                                                                    08/19/12  Dear [facility]. [name],  In a better effort to capture your patient's severity of illness, reflect appropriate length of stay and utilization of resources, a review of the patient medical record has revealed the following indicators the diagnosis of Heart Failure.    Based on your clinical judgment, please clarify and document in a progress note and/or discharge summary the clinical condition associated with the following supporting information:  In responding to this query please exercise your independent judgment.  The fact that a query is asked, does not imply that any particular answer is desired or expected.  Please clarify "decompensated CHF"  Possible Clinical Conditions?  Chronic Systolic Congestive Heart Failure Chronic Diastolic Congestive Heart Failure Chronic Systolic & Diastolic Congestive Heart Failure Acute Systolic Congestive Heart Failure Acute Diastolic Congestive Heart Failure Acute Systolic & Diastolic Congestive Heart Failure Acute on Chronic Systolic Congestive Heart  Failure Acute on Chronic Diastolic Congestive Heart Failure Acute on Chronic Systolic & Diastolic  Congestive Heart Failure Other Condition________________________________________ Cannot Clinically Determine  Clinical Information:  Risk Factors: Cardiology consult note="decompensation of CHF" & "INR is elevated, likely as the result of concomitant congestive heart failure" History of Nonischemic Cardiomyopathy, LBBB, & Hypertension A Fib  Signs & Symptoms: Chest pain Shortness of Breath Dyspnea on exertion gradually worsening  Diagnostics: Echo results: 08/03/12 EF: 20% EKG: A Fib Radiology: CXR Impression : cardiomegaly without acute disease Treatment:  Lasix 60 IV q12h Cardiology consult   Reviewed: Query answered by Joni Reining NP 2/28pn Chronic Systolic CHF  Thank You,  Harless Litten RN, MSN Clinical Documentation Specialist: Office# [phone] [facility] Health Information Management [place]

## 2012-08-19 NOTE — Care Management Note (Unsigned)
    Page 1 of 1   08/19/2012     3:30:25 PM   CARE MANAGEMENT NOTE 08/19/2012  Patient:  [name], [name]   Account Number:  [id number]  Date Initiated:  08/19/2012  Documentation initiated by:  Sharrie Rothman  Subjective/Objective Assessment:   Pt admitted from home with a fib with rvr. Pt lives with her husband and will return home at discharge. Pt has a cane, walker, wheelchair, and BSC for home use. Pt is fairly independent with ADL's.     Action/Plan:   Will follow for possible HH needs at discharge.   Anticipated DC Date:  08/23/2012   Anticipated DC Plan:  HOME/SELF CARE      DC Planning Services  CM consult      Choice offered to / List presented to:             Status of service:  In process, will continue to follow Medicare Important Message given?   (If response is "NO", the following Medicare IM given date fields will be blank) Date Medicare IM given:   Date Additional Medicare IM given:    Discharge Disposition:    Per UR Regulation:    If discussed at Long Length of Stay Meetings, dates discussed:    Comments:  08/19/12 1530 Arlyss Queen, RN BSN CM

## 2012-08-19 NOTE — H&P (Signed)
NAMEMEGEAN, FABIO NO.:  [id number]  MEDICAL RECORD NO.:  [id number]  LOCATION:  IC01                          FACILITY:  [facility]  PHYSICIAN:  Mila Homer. [name], M.D.DATE OF BIRTH:  [birth year]  DATE OF ADMISSION:  08/18/2012 DATE OF DISCHARGE:  LH                             HISTORY & PHYSICAL   This 67 year old woman was recently in the hospital with a urinary tract infection, new onset atrial fibrillation.  Since going home, she was put initially on diltiazem long-acting 120 mg daily along with carvedilol 25 mg b.i.d. and lisinopril 10 mg daily, both medicines which she has been on long-term for congestive heart failure.  Unfortunately, her blood pressure dropped to the 80/50 range so carvedilol had to be decreased to 25 mg 1/2 tablet b.i.d. and lisinopril to 10 mg 1/2 tablet daily.  Eventually, the diltiazem had to be changed to 30 mg b.i.d. due to persistent hypotension.  She has also been on warfarin.  She had an INR of 5 about 4 days ago, so warfarin was held, then reinstituted at a lower dose, but has also been elevated today.  She lives with her husband in [place].  Today she began to get short of breath, similar to how she was when she came in with atrial fibrillation the last visit.  Her husband called me and I recommended evaluation in the emergency room.  Other medications include:  1. Diphenhydramine/acetaminophen 25/500 q.h.s. for sleep. 2. Furosemide 80 mg in the a.m. and 40 mg in the p.m. 3. Hydrocodone/APAP 10/325 t.i.d. for her hip and knee pain. 4. Metformin 1000 mg b.i.d. 5. Oxycodone/APA 10/325 q.i.d. for very severe pain. 6. Pravastatin 40 mg 2 at bedtime.  She had hip surgery on the right years ago, but she was too much of a medical risk to have surgery on the left and has had severe pain from this since then.  This has caused her to use a wheeled walker.  Other surgeries include:  1. Abdominal hysterectomy. 2.  Knee arthroscopy. 3. Colonoscopy with polypectomy. 4. AV cardiac pacemaker insertion in 2005. 5. She has had number of colonic polyps and it has been some years     since her last colonoscopy.  Other medical problems include:  1. Villous adenoma of the colon. 2. Chronic kidney disease. 3. Degenerative joint disease. 4. Obstructive sleep apnea. 5. Urinary incontinence. 6. Tobacco abuse disorder. 7. Benign essential hypertension.  PHYSICAL EXAM:  GENERAL:  In the ICU showed a pleasant middle-aged woman who is supine in bed.  Her husband was in attendance.  Her color was good.  Her speech was normal.  Her sensorium was intact. VITAL SIGNS:  Temperature is 97.7, pulse 56, respiratory rate 25, blood pressure 100/83, O2 saturation 100%. HEART:  Irregular irregularity with a rate of about 60. LUNGS:  Clear throughout and she is moving air well, but had decreased breath sounds. ABDOMEN:  Soft without organomegaly or mass or tenderness. EXTREMITIES:  She had no edema of the ankles.  Admission BUN was 61 with a creatinine of 1.85, troponin 0.30, glucose 146, and INR was 6.12.  Chest x-ray showed cardiomegaly, but no  acute disease.  Twelve-lead EKG showed an undetermined rhythm initially with right superior axis deviation, left bundle branch block.  On the 13th of the month her BUN was 40, creatinine 1.29, and on the 11th the BUN was 32 with a creatinine 1.20.  ADMITTING DIAGNOSES: 1. Atrial fibrillation with rapid ventricular response. 2. Congestive heart failure. 3. Hypotension. 4. Type 2 diabetes. 5. Obstructive sleep apnea. 6. Chronic kidney disease.  She is currently in the ICU on a diltiazem drip.  I have discussed the case with Dr. Dietrich Pates, her cardiologist.  She will be on diltiazem, but will also be on furosemide 60 mg IV q.12 hours, lisinopril 5 mg daily, carvedilol 25 mg b.i.d. as long as her blood pressure tolerates this.  He is planning a cardioversion  tomorrow.  She has received 5 mg of vitamin K subcu because of her elevated INR. Will be checking her INRs daily.  I discussed all of this with the patient and her husband.     Mila Homer. [name], M.D.     SDK/MEDQ  D:  08/18/2012  T:  08/19/2012  Job:  [id number]

## 2012-08-19 NOTE — Progress Notes (Signed)
CONSET FORM SIGHED MY PT'S HUSBAND, AT HER REQUEST FOR DR ROTHBART TO PREFORM AN TEE AND CARDIOVERSION IN AM.

## 2012-08-20 ENCOUNTER — Encounter (HOSPITAL_COMMUNITY): Payer: Self-pay | Admitting: Anesthesiology

## 2012-08-20 ENCOUNTER — Inpatient Hospital Stay (HOSPITAL_COMMUNITY): Payer: Medicare Other | Admitting: Anesthesiology

## 2012-08-20 ENCOUNTER — Encounter (HOSPITAL_COMMUNITY)
Admission: EM | Disposition: A | Discharge status: Home / Self Care | Payer: Self-pay | Source: Home / Self Care | Attending: Family Medicine

## 2012-08-20 DIAGNOSIS — I5023 Acute on chronic systolic (congestive) heart failure: Secondary | ICD-10-CM

## 2012-08-20 DIAGNOSIS — I4891 Unspecified atrial fibrillation: Secondary | ICD-10-CM

## 2012-08-20 HISTORY — PX: CARDIOVERSION: SHX1299

## 2012-08-20 HISTORY — PX: TEE WITHOUT CARDIOVERSION: SHX5443

## 2012-08-20 LAB — GLUCOSE, CAPILLARY: Glucose-Capillary: 180 mg/dL — ABNORMAL HIGH (ref 70–99)

## 2012-08-20 LAB — BASIC METABOLIC PANEL
Calcium: 10 mg/dL (ref 8.4–10.5)
GFR calc non Af Amer: 35 mL/min — ABNORMAL LOW (ref >90)
Glucose, Bld: 123 mg/dL — ABNORMAL HIGH (ref 70–99)
Sodium: 141 mEq/L (ref 135–145)

## 2012-08-20 LAB — PROTIME-INR
INR: 1.87 — ABNORMAL HIGH (ref 0.00–1.49)
Prothrombin Time: 20.8 seconds — ABNORMAL HIGH (ref 11.6–15.2)

## 2012-08-20 SURGERY — CARDIOVERSION
Anesthesia: Monitor Anesthesia Care

## 2012-08-20 MED ORDER — LACTATED RINGERS IV SOLN
INTRAVENOUS | Status: DC | PRN
  Administered 2012-08-20: 13:00:00 via INTRAVENOUS

## 2012-08-20 MED ORDER — DIGOXIN 250 MCG PO TABS
0.2500 mg | ORAL_TABLET | Freq: Two times a day (BID) | ORAL | Status: AC
  Administered 2012-08-20 – 2012-08-21 (×3): 0.25 mg via ORAL
  Filled 2012-08-20 (×3): qty 1

## 2012-08-20 MED ORDER — FUROSEMIDE 40 MG PO TABS
60.0000 mg | ORAL_TABLET | Freq: Two times a day (BID) | ORAL | Status: DC
  Administered 2012-08-21 – 2012-08-22 (×3): 60 mg via ORAL
  Filled 2012-08-20 (×3): qty 1
  Filled 2012-08-20: qty 3

## 2012-08-20 MED ORDER — FENTANYL CITRATE 0.05 MG/ML IJ SOLN
INTRAMUSCULAR | Status: AC
  Filled 2012-08-20: qty 2

## 2012-08-20 MED ORDER — MIDAZOLAM HCL 2 MG/2ML IJ SOLN
INTRAMUSCULAR | Status: AC
  Filled 2012-08-20: qty 2

## 2012-08-20 MED ORDER — PROPOFOL INFUSION 10 MG/ML OPTIME
INTRAVENOUS | Status: DC | PRN
  Administered 2012-08-20: 25 ug/kg/min via INTRAVENOUS

## 2012-08-20 MED ORDER — DIGOXIN 125 MCG PO TABS
0.1250 mg | ORAL_TABLET | Freq: Every day | ORAL | Status: DC
  Administered 2012-08-22: 0.125 mg via ORAL

## 2012-08-20 MED ORDER — LIDOCAINE HCL (PF) 1 % IJ SOLN
INTRAMUSCULAR | Status: AC
  Filled 2012-08-20: qty 5

## 2012-08-20 MED ORDER — PROPOFOL 10 MG/ML IV EMUL
INTRAVENOUS | Status: AC
  Filled 2012-08-20: qty 20

## 2012-08-20 MED ORDER — MIDAZOLAM HCL 5 MG/ML IJ SOLN
INTRAMUSCULAR | Status: DC | PRN
  Administered 2012-08-20: 2 mg via INTRAVENOUS

## 2012-08-20 MED ORDER — POTASSIUM CHLORIDE CRYS ER 20 MEQ PO TBCR
20.0000 meq | EXTENDED_RELEASE_TABLET | Freq: Two times a day (BID) | ORAL | Status: DC
  Administered 2012-08-20 – 2012-08-22 (×5): 20 meq via ORAL
  Filled 2012-08-20 (×5): qty 1

## 2012-08-20 MED ORDER — FENTANYL CITRATE 0.05 MG/ML IJ SOLN
INTRAMUSCULAR | Status: DC | PRN
  Administered 2012-08-20 (×2): 25 ug via INTRAVENOUS

## 2012-08-20 MED ORDER — WARFARIN SODIUM 5 MG PO TABS
5.0000 mg | ORAL_TABLET | Freq: Once | ORAL | Status: AC
  Administered 2012-08-20: 5 mg via ORAL
  Filled 2012-08-20: qty 1

## 2012-08-20 MED ORDER — ENOXAPARIN SODIUM 80 MG/0.8ML ~~LOC~~ SOLN
70.0000 mg | SUBCUTANEOUS | Status: AC
  Administered 2012-08-20: 70 mg via SUBCUTANEOUS
  Filled 2012-08-20: qty 0.8

## 2012-08-20 NOTE — Progress Notes (Signed)
*  PRELIMINARY RESULTS* Echocardiogram Echocardiogram Transesophageal has been performed.  [name] 08/20/2012, 1:51 PM

## 2012-08-20 NOTE — CV Procedure (Signed)
Procedure Note-Transesophageal Echocardiogram LATAVIA GOGA [id number] March 29, 1946  Procedure: TEE Indications:  Atrial Fibrillation  Procedure Details Consent: Risks of procedure as well as the alternatives and risks of each were explained to the (patient/caregiver).  Consent for procedure obtained. Time Out: Verified patient identification, verified procedure and site, verified correct patient position, romazicon and intubation equipment available, meds/allergies/history reviewed.  Performed  Cardiac monitor, pulse oximetry, supplemental oxygen.  Sedation given: midazolam Probe inserted without difficulty.  Evaluation Findings: See transesophageal echocardiogram report Complications: None Patient did tolerate procedure well  [place] Bing 08/20/2012   1:08 PM  Procedure Note-Cardioversion ZUZANNA MARONEY [id number] 07/12/1945  Procedure: DC Cardioversion Indications:  Atrial Fibrillation  Procedure Details Consent: Risks of procedure as well as the alternatives and risks of each were explained to the (patient/caregiver).  Consent for procedure obtained. Time Out: Verified patient identification, verified procedure and site, verified correct patient position, romazicon and intubation equipment available, meds/allergies/history reviewed, theraputic anticoagulation verified.  Performed  Cardiac monitor, pulse oximetry, supplemental oxygen.  Sedation given: midazolam Pad electrodes placed anterior and posterior chest.  Cardioverted 1 time(s).  Cardioverted at 200J.  Evaluation Findings: Post procedure EKG shows: NSR Complications: None Patient did tolerate procedure well.  [place] Bing 08/20/2012, 1:09 PM

## 2012-08-20 NOTE — Anesthesia Preprocedure Evaluation (Addendum)
Anesthesia Evaluation  Patient identified by MRN, date of birth, ID band Patient awake    Reviewed: Allergy & Precautions, H&P , NPO status , Patient's Chart, lab work & pertinent test results, reviewed documented beta blocker date and time   History of Anesthesia Complications Negative for: history of anesthetic complications  Airway Mallampati: I TM Distance: >3 FB     Dental  (+) Edentulous Upper and Edentulous Lower   Pulmonary sleep apnea , pneumonia -,  breath sounds clear to auscultation        Cardiovascular hypertension, Pt. on medications +CHF + dysrhythmias Atrial Fibrillation Rhythm:Irregular Rate:Normal     Neuro/Psych    GI/Hepatic GERD-  ,  Endo/Other  diabetes, Type 2  Renal/GU Renal InsufficiencyRenal disease     Musculoskeletal   Abdominal   Peds  Hematology   Anesthesia Other Findings   Reproductive/Obstetrics                         Anesthesia Physical Anesthesia Plan  ASA: IV  Anesthesia Plan: MAC   Post-op Pain Management:    Induction:   Airway Management Planned: Simple Face Mask  Additional Equipment:   Intra-op Plan:   Post-operative Plan:   Informed Consent:   Plan Discussed with: Anesthesiologist  Anesthesia Plan Comments: (08/20/12  Plan MAC, discussed with anesthesiologist.)        Anesthesia Quick Evaluation

## 2012-08-20 NOTE — Progress Notes (Signed)
ANTICOAGULATION CONSULT NOTE   Pharmacy Consult for Coumadin Indication: atrial fibrillation  No Known Allergies  Patient Measurements: Height: 5\' 5"  (165.1 cm) Weight: 166 lb 7.2 oz (75.5 kg) IBW/kg (Calculated) : 57  Vital Signs: Temp: 97.5 F (36.4 C) (02/28 0800) Temp src: Oral (02/28 0800) BP: 90/53 mmHg (02/28 1000) Pulse Rate: 87 (02/28 1000)  Labs:  Recent Labs  08/18/12 1551 08/18/12 1552 08/19/12 0455 08/20/12 0440  HGB 12.1  --   --   --   HCT 36.2  --   --   --   PLT 222  --   --   --   APTT  --  49*  --   --   LABPROT  --  50.1* 40.8* 20.8*  INR  --  6.12* 4.64* 1.87*  CREATININE 1.85*  --  1.67* 1.50*  TROPONINI <0.30  --   --   --    Estimated Creatinine Clearance: 36.5 ml/min (by C-G formula based on Cr of 1.5).  Medical History: Past Medical History  Diagnosis Date  . Cardiomyopathy, nonischemic 10/07    EF 10% in 03/2006, 35% in 10/08 and 10-15% in 10/09 normal coronary angiography in 1999  . 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 Pulmonary embolism 2/07    after total right hip arthroplasty  . Hilar density     infrahilar mass/adenopathy on CT scan 5/07; subsequently  resolved  . Diabetes mellitus, type 2   . LBBB (left bundle branch block)   . GERD (gastroesophageal reflux disease)   . Hyperlipidemia   . Hypertension   . Tobacco abuse     discontinued in 1997, and then resumed  . Urinary incontinence   . Pneumonia     h/o pleural effusion  . Anemia     mild and chronic  . Obstructive sleep apnea     mild-did not tolerate CPAP  . Degenerative joint disease     of knees, shoulder, and hips  . 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 Chronic kidney disease     creatinin-1.44 in 1/09; 1.51 in 1/10   Medications:  Scheduled:  . amoxicillin  500 mg Oral Q8H  . atorvastatin  20 mg Oral q1800  . carvedilol  25 mg Oral BID WC  . diltiazem   10 mg Intravenous Once  . feeding supplement  237 mL Oral BID BM  . furosemide  60 mg Intravenous Q12H  . insulin aspart  0-5 Units Subcutaneous QHS  . insulin aspart  0-9 Units Subcutaneous TID WC  . lisinopril  5 mg Oral Daily  . metoprolol  5 mg Intravenous Once  . pantoprazole  40 mg Oral Daily  . potassium chloride  20 mEq Oral BID  . sodium chloride  3 mL Intravenous Q12H  . Warfarin - Pharmacist Dosing Inpatient   Does not apply q1800   Assessment: 67 yo F on chronic warfarin 5mg  alternating with 2.5mg  for Afib.  INR supra-therapeutic on admission likely secondary to acute HF exacerbation.  Vitamin K was given on admission to reverse elevated INR.  INR is now below therapeutic target.  Initial Coumadin response will likely be blunted by Vitamin K effect.  Anticipate pt will need lower requirements overall than was previously ordered PTA.  No bleeding noted.   Goal of Therapy:  INR 2-3   Plan:  Resume Coumadin with 5mg  today x 1  Daily INR  Margo Aye, Jalie Eiland A 08/20/2012,10:51 AM

## 2012-08-20 NOTE — H&P (View-Only) (Signed)
CARDIOLOGY CONSULT NOTE  Patient ID: [name] MRN: [id number] DOB/AGE: 07/26/1945 67 y.o.  Admit date: 08/02/2012 Referring Physician: Knowlton, Stephen MD Primary PhysicianKNOWLTON,STEPHEN D, MD Primary Cardiologist:Rothbart, Robert MD Reason for Consultation: New Onset Atrial fibrillation, NICM, Chest pain Active Problems:   HYPERTENSION   Cardiomyopathy, nonischemic   Diabetes mellitus, type 2   GERD (gastroesophageal reflux disease)   Degenerative joint disease   New onset a-fib   Chest tightness or pressure   Systolic HF (heart failure)  HPI: [name] is a 67 y/o patient admitted with new onset atrial fib with RVR, with symptoms beginning 3 days prior to admission with associated chest pressure and mild shortness of breath, and fatigue. She came to ER at the insistence of her husband.  Found to be in atrial fib with RVR heart rates up into the 140's. She was placed on cardizem gtt with heart rate control achieved. BP 139/90. She was found to be low normal on potassium at 3.6, elevated blood glucose of 235, Creatinine of 1.33. Pro-BNP of 27, 771.  CXR demonstrated mild bibasilar opacities reflecting atelectasis or scarring, but no pulmonary edema or CHF. Troponin negative X 2. Echocardiogram has been completed but not yet read.     She has a history of non-ischemic CM, most recent EF of 10%-15% in 2009, with moderate MR. She had an AICD pacemaker but this was removed in the setting of sepsis, lead infection and endocarditis. Other history includes DM, LBBB, PE  CKD, tobacco abuse and hyperlipidemia. She continues on Cardizem drip at 5 mg/hr with good rate control. She is feeling better but not at baseline.   Review of systems complete and found to be negative unless listed above   Past Medical History  Diagnosis Date  . Cardiomyopathy, nonischemic 10/07    EF 10% in 03/2006, 35% in 10/08 and 10-15% in 10/09 normal coronary angiography in 1999  . 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 Pulmonary embolism 2/07    after total right hip arthroplasty  . Hilar density     infrahilar mass/adenopathy on CT scan 5/07; subsequently  resolved  . Diabetes mellitus, type 2   . LBBB (left bundle branch block)   . GERD (gastroesophageal reflux disease)   . Hyperlipidemia   . Hypertension   . Tobacco abuse     discontinued in 1997, and then resumed  . Urinary incontinence   . Pneumonia     h/o pleural effusion  . Anemia     mild and chronic  . Obstructive sleep apnea     mild-did not tolerate CPAP  . Degenerative joint disease     of knees, shoulder, and hips  . 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 Chronic kidney disease     creatinin-1.44 in 1/09; 1.51 in 1/10    Family History  Problem Relation Age of Onset  . Hypertension Mother   . Diabetes Mother   . Coronary artery disease Father   . Diabetes Brother   . Hypertension Brother   . Lung cancer Brother   . Arthritis Other   . Diabetes Other   . Heart disease Other     female < 55    History   Social History  . Marital Status: Married    Spouse Name: N/A    Number of Children: N/A  . Years of Education: N/A   Occupational   History  . Not on file.   Social History Main Topics  . Smoking status: Former Smoker    Types: Cigarettes    Quit date: 08/04/2007  . Smokeless tobacco: Never Used     Comment: 1 pack per month   . Alcohol Use: No  . Drug Use: No  . Sexually Active: Not Currently    Birth Control/ Protection: None   Other Topics Concern  . Not on file   Social History Narrative   Married, lives with spouse. Retired housekeeper.     Past Surgical History  Procedure Laterality Date  . Pacemaker removal  11/18/05    Enterococcal infection  . Total hip arthroplasty  2/07    Right  . Abdominal hysterectomy  1990/92    Initial partial hysterectomy followed by BSO  . Knee arthroscopy       Remote  . Colonoscopy w/ polypectomy  2009  . A-v cardiac pacemaker insertion  12/05    Biventricular pacemaker/AICD     Prescriptions prior to admission  Medication Sig Dispense Refill  . carvedilol (COREG) 25 MG tablet Take 25 mg by mouth 2 (two) times daily.        . diphenhydramine-acetaminophen (TYLENOL PM EXTRA STRENGTH) 25-500 MG TABS Take 1 tablet by mouth at bedtime as needed (for sleep).       . furosemide (LASIX) 80 MG tablet Take 40-80 mg by mouth 2 (two) times daily. 1 tab am. 1/2 tab pm      . HYDROcodone-acetaminophen (NORCO) 10-325 MG per tablet Take 1 tablet by mouth 3 (three) times daily as needed for pain (for pain).      . lisinopril (PRINIVIL,ZESTRIL) 10 MG tablet Take 10 mg by mouth daily.      . metFORMIN (GLUCOPHAGE) 1000 MG tablet Take 1,000 mg by mouth 2 (two) times daily.        . polyethylene glycol powder (GLYCOLAX/MIRALAX) powder       . pravastatin (PRAVACHOL) 40 MG tablet Take 40 mg by mouth 2 (two) times daily.         Physical Exam: Blood pressure 124/72, pulse 59, temperature 97.8 F (36.6 C), temperature source Oral, resp. rate 23, height 5' 5" (1.651 m), weight 172 lb 2.9 oz (78.1 kg), SpO2 93.00%.    General: Well developed, well nourished, in no acute distress, feeling very weak. Head: Eyes PERRLA, No xanthomas.   Normal cephalic and atramatic  Lungs: Clear bilaterally to auscultation with the exception of the bases. Poor respiratory effort is noted. Heart: HRIR S1 S2, 1/6 systolic murmur at the apex,.  Pulses are 2+ & equal.            No carotid bruit. No JVD.  No abdominal bruits. No femoral bruits. Abdomen: Bowel sounds are positive, abdomen soft and non-tender without masses or                  Hernia's noted. Msk:  Back normal, . Normal strength and tone for age. Extremities: Mild clubbing, cyanosis, no edema.  DP +1 Neuro: Alert and oriented X 3. Psych:  Good affect, responds appropriately   Labs:   Lab Results  Component Value Date    WBC 8.7 08/03/2012   HGB 11.4* 08/03/2012   HCT 33.9* 08/03/2012   MCV 92.4 08/03/2012   PLT 221 08/03/2012    Recent Labs Lab 08/03/12 0149 08/03/12 0430  NA  --  144  K  --  3.6  CL  --    105  CO2  --  24  BUN  --  32*  CREATININE  --  1.20*  CALCIUM  --  10.2  PROT 7.7  --   BILITOT 0.9  --   ALKPHOS 80  --   ALT 15  --   AST 13  --   GLUCOSE  --  151*   Lab Results  Component Value Date   CKTOTAL 58 04/10/2008   CKMB 2.8 04/10/2008   TROPONINI <0.30 08/03/2012    Lab Results  Component Value Date   CHOL 171 05/27/2010   CHOL 168 07/09/2009   CHOL 174 12/02/2008   Lab Results  Component Value Date   HDL 38 05/27/2010   HDL 33 07/09/2009   HDL 34 12/02/2008   Lab Results  Component Value Date   LDLCALC 102 05/27/2010   LDLCALC 43 07/09/2009   LDLCALC 88 12/02/2008   Lab Results  Component Value Date   TRIG 154 05/27/2010   TRIG 215 07/09/2009   TRIG 258 12/02/2008   Lab Results  Component Value Date   CHOLHDL 7.7 Ratio 07/16/2007   No results found for this basename: LDLDIRECT     Echocardiogram: 04/11/2008 SUMMARY - The left ventricle was mildly dilated. Overall left ventricular systolic function was severely reduced. Left ventricular ejection fraction was estimated , range being 10 % to 15 %. There was severe diffuse left ventricular hypokinesis. Left ventricular Jiovanny Burdell thickness was mildly increased. - There was trivial aortic valvular regurgitation. - There was mild fibrocalcific change of the aortic root. - There was mild mitral annular calcification. There was mild to moderate mitral valvular regurgitation. The effective orifice of mitral regurgitation by proximal isovelocity surface area was 0.13 cm^2. The volume of mitral regurgitation by proximal isovelocity surface area was 26 cc. - The left atrium was moderately dilated. The interatrial septum bows from right to left, consistent with increased right atrial pressure. - The estimated peak right  ventricular systolic pressure was mildly increased. - There was mild to moderate tricuspid valvular regurgitation. - There was a trivial pericardial effusion posterior to the heart.  Radiology: Dg Chest Portable 1 View  08/02/2012  *RADIOLOGY REPORT*  Clinical Data: Chest pain.  PORTABLE CHEST - 1 VIEW  Comparison: Chest radiograph performed 04/11/2008  Findings: The lungs are well-aerated.  Mild bibasilar opacities may reflect atelectasis or scarring.  There is no evidence of pleural effusion or pneumothorax.  The cardiomediastinal silhouette is mildly enlarged.  No acute osseous abnormalities are seen.  Degenerative change is noted at the left glenohumeral joint.  IMPRESSION: Mild bibasilar opacities may reflect atelectasis or scarring; mild cardiomegaly again noted.   Original Report Authenticated By: Jeffrey Chang, M.D.    EKG: Atrial fibrillation with rapid ventricular response rate of 130 bpm Left axis deviation Left bundle branch block  ASSESSMENT AND PLAN:   1. New Onset Atrial Fibrillation with RVR: Uncertain etiology at present. No overt infective process. No ACS noted on EKG or per labs. She is responding to diltiazem gtt with good HR control, at 5 mg/hr. Repeat echo has been completed, but not yet read.  CHADs Score 3 for hypertension, diabetes, CHF. She is now on heparin gtt.Will place coumadin per pharmacy vs novel anticoagulant (Xarelto) but at 15 mg in the setting of CKD. Will discuss with Dr.Abbigail Anstey prior to beginning po anticoagulant.   2. Non-ischemic CM: Most recent echo in 2009 with EF of 10-15%. No AICD as she had infective endocarditis, requiring removal of device.   This may be reconsidered, but will wait until she is stable and discuss with EP. Continue coreg 25 mg BID, lasix, ACE inhibitor. Would not start spironolactone with CKD. Creatinine of 1.20 now. Replete potassium.  3. Hypertension: BP in normal range for severe systolic dysfunction. Continue lisinopril at 10mg for  now unless she becomes significantly hypotensive.  4. Normocytic Anemia: Hgb at 11.4, Hct 33.9,. May be dilutional in the setting of CHF. Anemia profile negative for iron deficiency, but has low Sat %.  5. Systolic CHF: Pro-BNP 27, 771. If she does not begin to diurese, may consider transfer to [facility] for milrinone gtt, but with low EF, high likelihood for ventricular arrhythmias. Ultrafiltration may be better option. No overt signs of fluid overload on clinical exam, with the exception of diminished lung sounds in the base. No JVD is seen. Await echo for evaluation of systolic fx.   6. Diabetes 7. Hx of PE 8. CKD: Baseline creatinine 1.4 9. OSA 10.Hyperlipidemia 11. Hx of GERD  Signed: Kathryn M. Lawrence NP [facility] 08/03/2012, 11:05 AM Co-Sign MD I have taken a history, reviewed medications, allergies, PMH, SH, FH, and reviewed ROS and examined the patient.  I agree with the assessment and plan. She is high risk for thromboembolic events will need oral anticoagulation. Add low dose spironolactone at 25mg q day.I would also consider amiodarone in am if does not convert on IV diltiazem.  Devyn Sheerin C. Johnathin Vanderschaaf, MD, FACC [facility] Pager:  [phone] 

## 2012-08-20 NOTE — Transfer of Care (Signed)
Immediate Anesthesia Transfer of Care Note  Patient: [name]  Procedure(s) Performed: Procedure(s) with comments: TEE GUIDED CARDIOVERSION (N/A) - To be done @ bedside TRANSESOPHAGEAL ECHOCARDIOGRAM (TEE) (N/A)  Patient Location: PACU and ICU  Anesthesia Type:MAC  Level of Consciousness: awake, alert  and oriented  Airway & Oxygen Therapy: Patient Spontanous Breathing and Patient connected to nasal cannula oxygen  Post-op Assessment: Report given to ICU nurse  Post vital signs: Reviewed and stable  Complications: No apparent anesthesia complications

## 2012-08-20 NOTE — Anesthesia Postprocedure Evaluation (Addendum)
  Anesthesia Post-op Note  Patient: [name]  Procedure(s) Performed: Procedure(s) with comments: TEE GUIDED CARDIOVERSION (N/A) - To be done @ bedside TRANSESOPHAGEAL ECHOCARDIOGRAM (TEE) (N/A)  Patient Location: PACU and ICU  Anesthesia Type:MAC  Level of Consciousness: awake, alert  and oriented  Airway and Oxygen Therapy: Patient Spontanous Breathing  Post-op Pain: none  Post-op Assessment: Post-op Vital signs reviewed, Patient's Cardiovascular Status Stable, Respiratory Function Stable, Patent Airway and No signs of Nausea or vomiting  Post-op Vital Signs: Reviewed and stable  Complications: No apparent anesthesia complications 08/22/12  Patient preparing for discharge.  No apparent anesthesia complications.

## 2012-08-20 NOTE — Progress Notes (Addendum)
SUBJECTIVE:No complaints at present. Ready for TEE/cardioversion.   LABS: Basic Metabolic Panel:  Recent Labs  82/95/62 0455 08/20/12 0440  NA 141 141  K 3.8 3.2*  CL 103 102  CO2 28 28  GLUCOSE 117* 123*  BUN 54* 40*  CREATININE 1.67* 1.50*  CALCIUM 9.9 10.0    Recent Labs  08/18/12 1551  WBC 8.1  NEUTROABS 5.4  HGB 12.1  HCT 36.2  MCV 93.1  PLT 222   Cardiac Enzymes:  Recent Labs  08/18/12 1551  TROPONINI <0.30   Echocardiogram:08/03/2012 Severe LAE; mild to moderate MR; mild pulmonary hypertension LV: Normal size; mild LVH. ejection fraction was 20%. Severe diffuse hypokinesis; c/w 2009, chamber size smaller; LV function unchanged.  PHYSICAL EXAM BP 129/74  Pulse 69  Temp(Src) 98.1 F (36.7 C) (Oral)  Resp 16  Ht 5\' 5"  (1.651 m)  Wt 166 lb 7.2 oz (75.5 kg)  BMI 27.7 kg/m2  SpO2 98% General: Well developed, well nourished, in no acute distress Head: Eyes PERRLA, No xanthomas.   Normal cephalic and atramatic  Lungs: Clear bilaterally to auscultation and percussion. Heart: HRIR S1 S2, No MRG .  Pulses are 2+ & equal.            No carotid bruit. No JVD.  No abdominal bruits. No femoral bruits. Abdomen: Bowel sounds are positive, abdomen soft and non-tender without masses or                  Hernia's noted. Msk:  Back normal, normal gait. Normal strength and tone for age. Extremities: No clubbing, cyanosis or edema.  DP +1 Neuro: Alert and oriented X 3. Psych:  Good affect, responds appropriately  I&O inaccurate due to urinary incontinence; weight decreased 1 kg  TELEMETRY: Reviewed telemetry pt in: atrial fib, rate variable from 60's to 90's.   ASSESSMENT AND PLAN: 1. Atrial fibrillation with RVR: Review of echo demonstrates severely dilated left atrium, with EF of 20%.  TEE/DCCV this am. INR 1.87 this am, down from 6.12 at this highest two days ago after holding coumadin. Heart rate in the  70's on average with salvos of increased HR into the 90's  at rest. Hope to regain NSR for better CO in the setting of decompensated CHF. Continues on diltiazem, which may not be optimal for her systolic dysfunction. Consider changing to digoxin. Continue IV lasix for now.  2. Severe Systolic Dysfunction with complications of decompensated systolic CHF: Now has been placed on dobutamine since last night for CO assistance and diureses. wts show 2 lbs loss. She is breathing better, no evidence of edema or lung congestion.   3. Nonischemic CM: EF of 20%.: She is on optimal dose of carvedilol, with ACE inhibitor. Creatinine is 1.50 improved from 1.85 on admission. Probably related to cardiorenal syndrome with low EF. Consider adding spironolactone to medication regimen once she is re-evaluated post TEE/DCCV.   4. Hypertension: Well controlled at present. 5. Hx of OSA 6. CKD 7. Tobacco abuse 8. Anemia: Hgb stable.  Bettey Mare. Lyman Bishop NP [facility] 08/20/2012, 7:55 AM  Cardiology Attending Patient interviewed and examined. Discussed with Joni Reining, NP.  Above note annotated and modified based upon my findings.  Uncomplicated and successful TEE guided cardioversion with improved sense of well-being on the part of the patient. Dobutamine and intravenous furosemide discontinued as well as intravenous diltiazem. Digoxin added per Ms. Lawrence's suggestion.  Improved renal function probably reflects improved renal perfusion with dobutamine.  If  renal function remain stable and patient continues to do well symptomatically, she can probably be discharged on Sunday.  If blood pressure increases, dose of lisinopril can be titrated upwards.  INR is slightly subtherapeutic following intramuscular vitamin K. A single dose of enoxaparin will be given. If INR remains subtherapeutic tomorrow, continue enoxaparin until INR> 2.0.  Gibson Bing, MD 08/20/2012, 5:34 PM

## 2012-08-20 NOTE — Interval H&P Note (Signed)
History and Physical Interval Note:  08/20/2012 1:06 PM  [name]  has presented today for cardioversion, with the diagnosis of atrial fibrillation.  The various methods of treatment have been discussed with the patient and family. After consideration of risks, benefits and other options for treatment, the patient has consented to DC cardioversion with transesophageal echocardiographic guidance.  The patient's history has been reviewed, patient examined, no change in status, stable for surgery.  I have reviewed the patient's chart and labs.  Questions were answered to the patient's satisfaction.     Lenoir Bing, M.D. 08/20/2012 12:20 PM

## 2012-08-21 LAB — PROTIME-INR: Prothrombin Time: 17.3 seconds — ABNORMAL HIGH (ref 11.6–15.2)

## 2012-08-21 LAB — GLUCOSE, CAPILLARY

## 2012-08-21 LAB — BASIC METABOLIC PANEL
BUN: 32 mg/dL — ABNORMAL HIGH (ref 6–23)
Creatinine, Ser: 1.58 mg/dL — ABNORMAL HIGH (ref 0.50–1.10)
GFR calc Af Amer: 38 mL/min — ABNORMAL LOW (ref >90)
GFR calc non Af Amer: 33 mL/min — ABNORMAL LOW (ref >90)

## 2012-08-21 MED ORDER — WARFARIN SODIUM 2.5 MG PO TABS
2.5000 mg | ORAL_TABLET | Freq: Once | ORAL | Status: AC
  Administered 2012-08-21: 2.5 mg via ORAL
  Filled 2012-08-21: qty 1

## 2012-08-21 MED ORDER — WARFARIN SODIUM 7.5 MG PO TABS
7.5000 mg | ORAL_TABLET | Freq: Once | ORAL | Status: AC
  Administered 2012-08-21: 7.5 mg via ORAL
  Filled 2012-08-21: qty 1

## 2012-08-21 NOTE — Progress Notes (Signed)
[name], CUTBIRTH NO.:  [id number]  MEDICAL RECORD NO.:  [id number]  LOCATION:  IC01                          FACILITY:  [facility]  PHYSICIAN:  Mila Homer. [name], M.D.DATE OF BIRTH:  July 23, 1945  DATE OF PROCEDURE: DATE OF DISCHARGE:                                PROGRESS NOTE   SUBJECTIVE:  She is still in the ICU.  She denies shortness of breath, palpitations or chest pain.  OBJECTIVE:  VITAL SIGNS:  Temperature is 98.1, pulse 69, respiratory rate 16, blood pressure 129/74.  O2 sat is 98%. LUNGS:  Clear throughout. HEART:  Has an irregular irregularity, rate of about 60. SKIN:  Her color is good. NEUROLOGIC:  Speech is normal.  ASSESSMENT: 1. Atrial fibrillation. 2. Congestive heart failure. 3. Hypotension. 4. Type 2 diabetes. 5. Chronic kidney disease. 6. Hypokalemia.  PLAN:  She is due for a cardioversion today.  We will replace potassium.     Mila Homer. [name], M.D.     SDK/MEDQ  D:  08/20/2012  T:  08/21/2012  Job:  [id number]

## 2012-08-21 NOTE — Progress Notes (Signed)
Report called to Lyda Jester and transferred pt to room 337 via wheelchair. All personal belongings were taken and family is aware of the transfer.

## 2012-08-21 NOTE — Progress Notes (Signed)
NAMESONAM, WANDEL NO.:  [id number]  MEDICAL RECORD NO.:  [id number]  LOCATION:  A337                          FACILITY:  [facility]  PHYSICIAN:  Mila Homer. [name], M.D.DATE OF BIRTH:  [birth year]  DATE OF PROCEDURE: DATE OF DISCHARGE:                                PROGRESS NOTE   SUBJECTIVE:  She is doing well.  She had a good night and slept well last night.  OBJECTIVE:  VITAL SIGNS:  Temperature is 97.7, pulse 70, respiratory rate 17, blood pressure 117/76, O2 saturation is 98%. GENERAL:  She is supine in bed.  She appears to be oriented and alert. HEART:  Regular rhythm with a rate of 70. LUNGS:  Clear throughout.  Blood test today showed a potassium up to 3.5, and a BUN 32 with a creatinine of 1.58.  INR is 1.46.  ASSESSMENT: 1. Atrial fibrillation with rapid ventricular response, now rate     controlled. 2. Chronic systolic heart failure. 3. Warfarin anticoagulation.  PLAN:  She can be transferred from the ICU to a monitored bed on the medical-surgical floor.  I have given her 7.5 mg of warfarin today.  She will continue on other medications as per Cardiology and hopefully discharge home tomorrow.     Mila Homer. [name], M.D.     SDK/MEDQ  D:  08/21/2012  T:  08/21/2012  Job:  [id number]

## 2012-08-21 NOTE — Progress Notes (Signed)
ANTICOAGULATION CONSULT NOTE   Pharmacy Consult for Coumadin Indication: atrial fibrillation  No Known Allergies  Patient Measurements: Height: 5\' 5"  (165.1 cm) Weight: 166 lb 7.2 oz (75.5 kg) IBW/kg (Calculated) : 57  Vital Signs: Temp: 97.7 F (36.5 C) (03/01 0720) Temp src: Oral (03/01 0720) BP: 106/59 mmHg (03/01 0932) Pulse Rate: 67 (03/01 0822)  Labs:  Recent Labs  08/18/12 1551  08/18/12 1552 08/19/12 0455 08/20/12 0440 08/21/12 0506  HGB 12.1  --   --   --   --   --   HCT 36.2  --   --   --   --   --   PLT 222  --   --   --   --   --   APTT  --   --  49*  --   --   --   LABPROT  --   < > 50.1* 40.8* 20.8* 17.3*  INR  --   < > 6.12* 4.64* 1.87* 1.46  CREATININE 1.85*  --   --  1.67* 1.50* 1.58*  TROPONINI <0.30  --   --   --   --   --   < > = values in this interval not displayed. Estimated Creatinine Clearance: 34.6 ml/min (by C-G formula based on Cr of 1.58).  Medical History: Past Medical History  Diagnosis Date  . Cardiomyopathy, nonischemic 10/07    EF 10% in 03/2006, 35% in 10/08 and 10-15% in 10/09 normal coronary angiography in 1999  . 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 Pulmonary embolism 2/07    after total right hip arthroplasty  . Hilar density     infrahilar mass/adenopathy on CT scan 5/07; subsequently  resolved  . Diabetes mellitus, type 2   . LBBB (left bundle branch block)   . GERD (gastroesophageal reflux disease)   . Hyperlipidemia   . Hypertension   . Tobacco abuse     discontinued in 1997, and then resumed  . Urinary incontinence   . Pneumonia     h/o pleural effusion  . Anemia     mild and chronic  . Obstructive sleep apnea     mild-did not tolerate CPAP  . Degenerative joint disease     of knees, shoulder, and hips  . 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 Chronic kidney disease     creatinin-1.44 in 1/09; 1.51 in 1/10    Medications:  Scheduled:  . amoxicillin  500 mg Oral Q8H  . atorvastatin  20 mg Oral q1800  . carvedilol  25 mg Oral BID WC  . [START ON 08/23/2012] digoxin  0.125 mg Oral Daily  . digoxin  0.25 mg Oral BID  . diltiazem  10 mg Intravenous Once  . [COMPLETED] enoxaparin (LOVENOX) injection  70 mg Subcutaneous Q24H  . feeding supplement  237 mL Oral BID BM  . furosemide  60 mg Oral BID  . insulin aspart  0-5 Units Subcutaneous QHS  . insulin aspart  0-9 Units Subcutaneous TID WC  . lisinopril  5 mg Oral Daily  . metoprolol  5 mg Intravenous Once  . pantoprazole  40 mg Oral Daily  . potassium chloride  20 mEq Oral BID  . sodium chloride  3 mL Intravenous Q12H  . [COMPLETED] warfarin  2.5 mg Oral Once  . [COMPLETED] warfarin  5 mg Oral ONCE-1800  . warfarin  7.5 mg Oral ONCE-1800  . Warfarin - Pharmacist Dosing Inpatient   Does not apply q1800  . [DISCONTINUED] furosemide  60 mg Intravenous Q12H   Assessment: 67 yo F on chronic warfarin 5mg  alternating with 2.5mg  for Afib.  INR supra-therapeutic on admission likely secondary to acute HF exacerbation.  Vitamin K was given on admission to reverse elevated INR.  INR is now below therapeutic target.  Initial Coumadin response will likely be blunted by Vitamin K effect.  Anticipate pt will need lower requirements overall than was previously ordered PTA.  No bleeding noted. Will increase Warfarin dose initially.  MD ordered Coumadin 7.5mg  today.  Goal of Therapy:  INR 2-3   Plan:  Coumadin 7.5mg  today (boost INR) Daily INR  Margo Aye, Krystol Rocco A 08/21/2012,10:03 AM

## 2012-08-22 LAB — BASIC METABOLIC PANEL
Calcium: 10 mg/dL (ref 8.4–10.5)
Creatinine, Ser: 1.28 mg/dL — ABNORMAL HIGH (ref 0.50–1.10)
GFR calc non Af Amer: 42 mL/min — ABNORMAL LOW (ref >90)
Glucose, Bld: 134 mg/dL — ABNORMAL HIGH (ref 70–99)
Sodium: 140 mEq/L (ref 135–145)

## 2012-08-22 LAB — PROTIME-INR: INR: 1.47 (ref 0.00–1.49)

## 2012-08-22 LAB — GLUCOSE, CAPILLARY

## 2012-08-22 MED ORDER — LISINOPRIL 10 MG PO TABS: 5.0000 mg | ORAL_TABLET | Freq: Every day | ORAL | Status: DC

## 2012-08-22 MED ORDER — DIGOXIN 125 MCG PO TABS: 0.1250 mg | ORAL_TABLET | Freq: Every day | ORAL | Status: DC

## 2012-08-22 NOTE — Progress Notes (Signed)
Pt discharged with instructions, prescriptions and [place].  She verbalizes understanding.  The patient left the floor via w/c with staff in stable condition.

## 2012-08-22 NOTE — Discharge Summary (Signed)
NAMECORINN, STOLTZFUS NO.:  [id number]  MEDICAL RECORD NO.:  [id number]  LOCATION:  A337                          FACILITY:  [facility]  PHYSICIAN:  Mila Homer. [name], M.D.DATE OF BIRTH:  1946-03-06  DATE OF ADMISSION:  08/18/2012 DATE OF DISCHARGE:  LH                              DISCHARGE SUMMARY   This 67 year old was admitted to the hospital with atrial fibrillation, rapid ventricular response.  She had a benign 5 day hospitalization extending from February 26 to August 22, 2012.  Initially she was tachycardic, but this was controlled rapidly with diltiazem IV.  She was admitted to the ICU, where the [facility] saw her in consult.  Her rate was controlled with diltiazem IV.  She also required IV dobutamine. However, she stayed in the intensive care unit for a day or 2, then was cardioverted and was back in normal sinus rhythm.  She was started on digoxin, in addition to her carvedilol and lisinopril.  Her 4th day, she was transferred to a med-surg bed, on a monitor and was ready for discharge home on her 5th day.  DISCHARGE MEDICATIONS: 1. Carvedilol 25 mg b.i.d. 2. Diphenhydramine/acetaminophen at bedtime for sleep. 3. Furosemide 80 mg 1 in the morning and half at night. 4. Hydrocodone/CPAP 10/325 three times a day for hip and knee pain. 5. Metformin 1000 mg b.i.d. 6. Oxycodone/APAP 10/325 q.i.d. for severe pain. 7. Polyethylene glycol 17 g with water daily for constipation. 8. Pravastatin 40 mg 2 at bedtime. 9. Warfarin as directed. She was also started on Lanoxin 0.25 mg for 4 doses to load for 1 g in the hospital, and then discharged home on Lanoxin 0.125 mg daily (30 with 2 refills).  She had been on lisinopril 10 mg daily, and this was changed to half of a 10 mg tablet daily.  In hospital, she was on sliding scale insulin, and at discharge we may decrease and eventually discontinued the metformin, given her CHF.  FINAL DISCHARGE  DIAGNOSES: 1. Atrial fibrillation with rapid ventricular response. 2. Systolic congestive heart failure. 3. Hypotension. 4. Type 2 diabetes. 5. Obstructive sleep apnea. 6. Chronic kidney disease. 7. Anticoagulation  FOLLOWUP:  Will be this week for INRs outpatient.     Mila Homer. [name], M.D.     SDK/MEDQ  D:  08/22/2012  T:  08/22/2012  Job:  [id number]

## 2012-08-23 ENCOUNTER — Ambulatory Visit (INDEPENDENT_AMBULATORY_CARE_PROVIDER_SITE_OTHER): Payer: Medicare Other | Admitting: Adult Health

## 2012-08-23 ENCOUNTER — Encounter (HOSPITAL_COMMUNITY): Payer: Self-pay | Admitting: Cardiology

## 2012-08-23 VITALS — BP 130/60 | HR 82 | Ht 65.0 in | Wt 164.0 lb

## 2012-08-23 DIAGNOSIS — I4891 Unspecified atrial fibrillation: Secondary | ICD-10-CM

## 2012-08-23 DIAGNOSIS — D649 Anemia, unspecified: Secondary | ICD-10-CM

## 2012-08-23 MED ORDER — LISINOPRIL 10 MG PO TABS: 5.0000 mg | ORAL_TABLET | Freq: Every day | ORAL | Status: DC

## 2012-08-23 MED ORDER — DIGOXIN 125 MCG PO TABS: 0.1250 mg | ORAL_TABLET | Freq: Every day | ORAL | Status: DC

## 2012-08-23 NOTE — Progress Notes (Signed)
HPI: [name] is a 67 y/o patient of Dr.Rothbart we are following for ongoing assessment and management of new onset atrial fibrillation diagnosed during recent hospital admission at [facility]. She has a history of NICM, with EF of 20% and severely dilated LA. During admission she had a TEE cardioversion with successful conversion to NSR. She was placed on digoxin and carvediolol 25 mg BID. She was also placed on coumadin, which is followed by Dr. [name] for dosing and INR checks. Other history includes Type II diabetes, systolic CHF, OSA, and CKD. Since discharge she is feeling some better, but has complaints of frequent diarrhea, which is new for her.She is medically compliant, denies bleeding issues on coumadin, racing HR, or chest discomfort.   No Known Allergies  Current Outpatient Prescriptions  Medication Sig Dispense Refill  . carvedilol (COREG) 25 MG tablet Take 25 mg by mouth 2 (two) times daily.        . digoxin (LANOXIN) 0.125 MG tablet Take 1 tablet (0.125 mg total) by mouth daily.  30 tablet  5  . diphenhydramine-acetaminophen (TYLENOL PM EXTRA STRENGTH) 25-500 MG TABS Take 1 tablet by mouth at bedtime as needed (for sleep).       . furosemide (LASIX) 80 MG tablet Take 80 mg by mouth 2 (two) times daily. 1 tab am. 1/2 tab pm      . HYDROcodone-acetaminophen (NORCO) 10-325 MG per tablet Take 1 tablet by mouth 3 (three) times daily as needed for pain (for pain).      Marland Kitchen lisinopril (PRINIVIL,ZESTRIL) 10 MG tablet Take 0.5 tablets (5 mg total) by mouth daily.  30 tablet  5  . metFORMIN (GLUCOPHAGE) 1000 MG tablet Take 1,000 mg by mouth 2 (two) times daily.        Marland Kitchen oxyCODONE-acetaminophen (PERCOCET) 10-325 MG per tablet Take 1 tablet by mouth 4 (four) times daily as needed for pain. For severe breakthrough pain      . polyethylene glycol powder (GLYCOLAX/MIRALAX) powder Take 17 g by mouth daily as needed (constipation).       . pravastatin (PRAVACHOL) 40 MG tablet Take 80 mg by mouth at  bedtime.       Marland Kitchen warfarin (COUMADIN) 5 MG tablet Take 2.5-5 mg by mouth daily. Patient alternates with 2.5mg  daily with 5mg  daily       No current facility-administered medications for this visit.    Past Medical History  Diagnosis Date  . Cardiomyopathy, nonischemic 10/07    EF 10% in 03/2006, 35% in 10/08 and 10-15% in 10/09 normal coronary angiography in 1999  . 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 Pulmonary embolism 2/07    after total right hip arthroplasty  . Hilar density     infrahilar mass/adenopathy on CT scan 5/07; subsequently  resolved  . Diabetes mellitus, type 2   . LBBB (left bundle branch block)   . GERD (gastroesophageal reflux disease)   . Hyperlipidemia   . Hypertension   . Tobacco abuse     discontinued in 1997, and then resumed  . Urinary incontinence   . Pneumonia     h/o pleural effusion  . Anemia     mild and chronic  . Obstructive sleep apnea     mild-did not tolerate CPAP  . Degenerative joint disease     of knees, shoulder, and hips  . 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  Chronic kidney disease     creatinin-1.44 in 1/09; 1.51 in 1/10    Past Surgical History  Procedure Laterality Date  . Pacemaker removal  11/18/05    Enterococcal infection  . Total hip arthroplasty  2/07    Right  . Abdominal hysterectomy  1990/92    Initial partial hysterectomy followed by BSO  . Knee arthroscopy      Remote  . Colonoscopy w/ polypectomy  2009  . A-v cardiac pacemaker insertion  12/05    Biventricular pacemaker/AICD  . Cardioversion N/A 08/20/2012    Procedure: TEE GUIDED CARDIOVERSION;  Surgeon: Kathlen Brunswick, MD;  Location: [facility];  Service: Cardiovascular;  Laterality: N/A;  To be done @ bedside  . Tee without cardioversion N/A 08/20/2012    Procedure: TRANSESOPHAGEAL ECHOCARDIOGRAM (TEE);  Surgeon: Kathlen Brunswick, MD;  Location: [facility];   Service: Cardiovascular;  Laterality: N/A;    ZOX:WRUEAV of systems complete and found to be negative unless listed above  PHYSICAL EXAM BP 130/60  Pulse 82  Ht 5\' 5"  (1.651 m)  Wt 164 lb (74.39 kg)  BMI 27.29 kg/m2  General: Well developed, well nourished, in no acute distress Head: Eyes PERRLA, No xanthomas.   Normal cephalic and atramatic  Lungs: Clear bilaterally to auscultation and percussion. Heart: HRRR S1 S2,occasional Xtra systole. without MRG.  Pulses are 2+ & equal.            No carotid bruit. No JVD.  No abdominal bruits. No femoral bruits. Abdomen: Bowel sounds are positive, abdomen soft and non-tender without masses or                  Hernia's noted. Msk:  Back normal, normal gait. Normal strength and tone for age. Extremities: No clubbing, cyanosis or edema.  DP +1 Neuro: Alert and oriented X 3. Psych:  Good affect, responds appropriately  EKG: NSR with LBBB, and occasional  PVC.  ASSESSMENT AND PLAN

## 2012-08-23 NOTE — Assessment & Plan Note (Addendum)
She is status post TEE/DCCV per Dr. Dietrich Pates on 08/20/2012 and has maintained NSR on carvedilol and digoxin. I will check BMET and CBC with use of coumadin and digoxin for ongoing assessment of kidney fx and for anemia. She will follow up in one month with Dr.Rothbart. Will eventually need a repeat echo now that she has regained NSR and atrial kick to reassess LV fx with prior systolic dysfunction on recent echo one month ago. Hemoccult cards will be provided.

## 2012-08-23 NOTE — Assessment & Plan Note (Signed)
Currently well controlled on ACE. She will have labs completed. No complaints of dizziness or orthostasis.

## 2012-08-23 NOTE — Assessment & Plan Note (Signed)
No evidence of fluid retention or decompensation. She continues on daily lasix doses. BMET to follow.

## 2012-08-23 NOTE — Addendum Note (Signed)
Addendum created 08/23/12 4098 by Moshe Salisbury, CRNA   Modules edited: Charges VN

## 2012-08-23 NOTE — Patient Instructions (Addendum)
Your physician recommends that you schedule a follow-up appointment in: ONE MONTH  YOUR PHYSICIAN RECOMMENDS THAT YOU COMPLETE A SERIES OF HEMO CULT CARDS AND RETURN TO OUR OFFICE ONCE COMPLETED  Your physician recommends that you return for lab work in: THIS WEEK (BMET,CBC,C-DIFF) SLIPS GIVEN  Your physician has recommended you make the following change in your medication:   1) START OTC FIBER TABLET ONE TIME DAILY (FIBER CON, OR CITRACAL ARE GOOD BRANDS TO SEEK)

## 2012-08-23 NOTE — Progress Notes (Deleted)
Name: [name]    DOB: 1945-10-30  Age: 67 y.o.  MR#: [id number]       PCP:  Milana Obey, MD      Insurance: Payor: [employer] [place]  Plan: BLUE MEDICARE  Product Type: *No Product type*    CC:   No chief complaint on file.   VS Filed Vitals:   08/23/12 1357  BP: 130/60  Pulse: 82  Height: 5\' 5"  (1.651 m)  Weight: 164 lb (74.39 kg)    Weights Current Weight  08/23/12 164 lb (74.39 kg)  08/20/12 166 lb 7.2 oz (75.5 kg)  08/20/12 166 lb 7.2 oz (75.5 kg)    Blood Pressure  BP Readings from Last 3 Encounters:  08/23/12 130/60  08/22/12 110/55  08/22/12 110/55     Admit date:  (Not on file) Last encounter with RMR:  Visit date not found   Allergy Review of patient's allergies indicates no known allergies.  Current Outpatient Prescriptions  Medication Sig Dispense Refill  . carvedilol (COREG) 25 MG tablet Take 25 mg by mouth 2 (two) times daily.        . digoxin (LANOXIN) 0.125 MG tablet Take 1 tablet (0.125 mg total) by mouth daily.  30 tablet  2  . diphenhydramine-acetaminophen (TYLENOL PM EXTRA STRENGTH) 25-500 MG TABS Take 1 tablet by mouth at bedtime as needed (for sleep).       . furosemide (LASIX) 80 MG tablet Take 80 mg by mouth 2 (two) times daily. 1 tab am. 1/2 tab pm      . HYDROcodone-acetaminophen (NORCO) 10-325 MG per tablet Take 1 tablet by mouth 3 (three) times daily as needed for pain (for pain).      Marland Kitchen lisinopril (PRINIVIL,ZESTRIL) 10 MG tablet Take 0.5 tablets (5 mg total) by mouth daily.  30 tablet  11  . metFORMIN (GLUCOPHAGE) 1000 MG tablet Take 1,000 mg by mouth 2 (two) times daily.        Marland Kitchen oxyCODONE-acetaminophen (PERCOCET) 10-325 MG per tablet Take 1 tablet by mouth 4 (four) times daily as needed for pain. For severe breakthrough pain      . polyethylene glycol powder (GLYCOLAX/MIRALAX) powder Take 17 g by mouth daily as needed (constipation).       . pravastatin (PRAVACHOL) 40 MG tablet Take 80 mg by mouth at bedtime.        Marland Kitchen warfarin (COUMADIN) 5 MG tablet Take 2.5-5 mg by mouth daily. Patient alternates with 2.5mg  daily with 5mg  daily       No current facility-administered medications for this visit.    Discontinued Meds:   There are no discontinued medications.  Patient Active Problem List  Diagnosis  . HYPERLIPIDEMIA  . HYPERTENSION  . LBBB  . URINARY INCONTINENCE  . Cardiomyopathy, nonischemic  . Enterococcal infection  . Pulmonary embolism  . Diabetes mellitus, type 2  . GERD (gastroesophageal reflux disease)  . Tobacco abuse, in remission  . Anemia, normocytic normochromic  . Obstructive sleep apnea  . Degenerative joint disease  . Chronic kidney disease  . Villous adenoma of colon  . Atrial fibrillation, persistent  . Chronic systolic congestive heart failure, NYHA class 3  . Chronic anticoagulation    LABS    Component Value Date/Time   NA 140 08/22/2012 0509   NA 140 08/21/2012 0506   NA 141 08/20/2012 0440   K 3.7 08/22/2012 0509   K 3.5 08/21/2012 0506   K 3.2* 08/20/2012 0440  CL 101 08/22/2012 0509   CL 102 08/21/2012 0506   CL 102 08/20/2012 0440   CO2 28 08/22/2012 0509   CO2 28 08/21/2012 0506   CO2 28 08/20/2012 0440   GLUCOSE 134* 08/22/2012 0509   GLUCOSE 123* 08/21/2012 0506   GLUCOSE 123* 08/20/2012 0440   BUN 28* 08/22/2012 0509   BUN 32* 08/21/2012 0506   BUN 40* 08/20/2012 0440   CREATININE 1.28* 08/22/2012 0509   CREATININE 1.58* 08/21/2012 0506   CREATININE 1.50* 08/20/2012 0440   CREATININE 1.20* 07/22/2011 1011   CREATININE 1.38* 10/18/2010 0835   CALCIUM 10.0 08/22/2012 0509   CALCIUM 10.0 08/21/2012 0506   CALCIUM 10.0 08/20/2012 0440   GFRNONAA 42* 08/22/2012 0509   GFRNONAA 33* 08/21/2012 0506   GFRNONAA 35* 08/20/2012 0440   GFRAA 49* 08/22/2012 0509   GFRAA 38* 08/21/2012 0506   GFRAA 40* 08/20/2012 0440   CMP     Component Value Date/Time   NA 140 08/22/2012 0509   K 3.7 08/22/2012 0509   CL 101 08/22/2012 0509   CO2 28 08/22/2012 0509   GLUCOSE 134* 08/22/2012 0509   BUN 28*  08/22/2012 0509   CREATININE 1.28* 08/22/2012 0509   CREATININE 1.20* 07/22/2011 1011   CALCIUM 10.0 08/22/2012 0509   PROT 7.7 08/03/2012 0149   ALBUMIN 4.1 08/03/2012 0149   AST 13 08/03/2012 0149   ALT 15 08/03/2012 0149   ALKPHOS 80 08/03/2012 0149   BILITOT 0.9 08/03/2012 0149   GFRNONAA 42* 08/22/2012 0509   GFRAA 49* 08/22/2012 0509       Component Value Date/Time   WBC 8.1 08/18/2012 1551   WBC 6.2 08/09/2012 0522   WBC 9.2 08/07/2012 0615   HGB 12.1 08/18/2012 1551   HGB 12.6 08/09/2012 0522   HGB 12.3 08/07/2012 0615   HCT 36.2 08/18/2012 1551   HCT 37.6 08/09/2012 0522   HCT 36.5 08/07/2012 0615   MCV 93.1 08/18/2012 1551   MCV 93.3 08/09/2012 0522   MCV 93.4 08/07/2012 0615    Lipid Panel     Component Value Date/Time   CHOL 171 05/27/2010   TRIG 154 05/27/2010   HDL 38 05/27/2010   CHOLHDL 7.7 Ratio 07/16/2007 0000   VLDL 64* 07/16/2007 0000   LDLCALC 102 05/27/2010    ABG    Component Value Date/Time   PHART 7.237* 04/09/2008 2015   PCO2ART 43.7 04/09/2008 2015   PO2ART 315.0* 04/09/2008 2015   HCO3 17.9* 04/09/2008 2015   TCO2 21 04/09/2008 2037   ACIDBASEDEF 8.1* 04/09/2008 2015   O2SAT 99.5 04/09/2008 2015     Lab Results  Component Value Date   TSH 1.388 08/03/2012   BNP (last 3 results)  Recent Labs  08/02/12 2151  PROBNP 27771.0*   Cardiac Panel (last 3 results) No results found for this basename: CKTOTAL, CKMB, TROPONINI, RELINDX,  in the last 72 hours  Iron/TIBC/Ferritin    Component Value Date/Time   IRON 51 07/22/2011 1011   TIBC 294 07/22/2011 1011   FERRITIN 71 07/22/2011 1011     EKG Orders placed in visit on 08/23/12  . EKG 12-LEAD     Prior Assessment and Plan Problem List as of 08/23/2012     ICD-9-CM   HYPERLIPIDEMIA   Last Assessment & Plan   07/14/2011 Office Visit Written 07/14/2011 12:59 PM by Kathlen Brunswick, MD     Most recent lipid profile in 11/2010 showed very adequate values, especially in light of the absence  of known vascular  disease.    HYPERTENSION   Last Assessment & Plan   05/10/2012 Office Visit Written 05/10/2012 12:23 PM by Kathlen Brunswick, MD     Blood pressure is improved from previous borderline hypotensive levels.  Current medications will be continued.    LBBB   URINARY INCONTINENCE   Cardiomyopathy, nonischemic   Last Assessment & Plan   05/10/2012 Office Visit Written 05/10/2012 12:20 PM by Kathlen Brunswick, MD     Stable cardiomyopathy with compensated congestive heart failure.  Current medications will be continued.    Enterococcal infection   Pulmonary embolism   Last Assessment & Plan   01/14/2011 Office Visit Written 01/14/2011  8:17 PM by Kathlen Brunswick, MD     She continues to remain free of thromboembolic disease without requiring full anticoagulation.    Diabetes mellitus, type 2   Last Assessment & Plan   05/10/2012 Office Visit Written 05/10/2012 12:22 PM by Kathlen Brunswick, MD     Patient reports CBGs less than 150, but is unaware of any recent A1c determination.  Most recent value available to me was 6.8 in 11/2010, indicating good control at that time.    GERD (gastroesophageal reflux disease)   Tobacco abuse, in remission   Last Assessment & Plan   07/14/2011 Office Visit Written 07/14/2011  1:01 PM by Kathlen Brunswick, MD     Patient reports minimal cigarette smoking since 2010.  She is encouraged to avoid tobacco use entirely.    Anemia, normocytic normochromic   Last Assessment & Plan   05/10/2012 Office Visit Written 05/10/2012 12:20 PM by Kathlen Brunswick, MD     No evidence for iron deficiency, occult blood loss or folate deficiency; B12 is low normal.  Stable minimal anemia does not appear to require further evaluation at this time.  CBC will be repeated.    Obstructive sleep apnea   Degenerative joint disease   Last Assessment & Plan   07/14/2011 Office Visit Edited 07/18/2011  7:12 PM by Kathlen Brunswick, MD     Degenerative joint disease of the hip  continues to significantly impair her quality of life, but Illeana appears to have come to an accommodation with her disease.  She uses a walker with adequate mobility and does not wish to reconsider orthopaedic surgery.  No falls have occurred for at least the past 6 months.    Chronic kidney disease   Last Assessment & Plan   05/10/2012 Office Visit Written 05/10/2012 12:21 PM by Kathlen Brunswick, MD     Renal function has improved since 2009 and is now near normal.    Villous adenoma of colon   Atrial fibrillation, persistent   Chronic systolic congestive heart failure, NYHA class 3   Chronic anticoagulation       Imaging: Dg Chest Portable 1 View  08/18/2012  *RADIOLOGY REPORT*  Clinical Data: Chest pain.  PORTABLE CHEST - 1 VIEW  Comparison: Plain film chest 08/02/2012 and 04/11/2012.  Findings: There is cardiomegaly without edema.  Lungs are clear. No pneumothorax or pleural fluid.  IMPRESSION: Cardiomegaly without acute disease.   Original Report Authenticated By: Holley Dexter, M.D.    Dg Chest Portable 1 View  08/02/2012  *RADIOLOGY REPORT*  Clinical Data: Chest pain.  PORTABLE CHEST - 1 VIEW  Comparison: Chest radiograph performed 04/11/2008  Findings: The lungs are well-aerated.  Mild bibasilar opacities may reflect atelectasis or scarring.  There is no evidence of pleural  effusion or pneumothorax.  The cardiomediastinal silhouette is mildly enlarged.  No acute osseous abnormalities are seen.  Degenerative change is noted at the left glenohumeral joint.  IMPRESSION: Mild bibasilar opacities may reflect atelectasis or scarring; mild cardiomegaly again noted.   Original Report Authenticated By: Tonia Ghent, M.D.

## 2012-08-23 NOTE — Assessment & Plan Note (Signed)
Will check stool sample for C-Diff post hospitalization due to new onset. Doubt medication side effect at this time. Will reassess on follow up visit. She is placed on fiber tablets as well.

## 2012-08-23 NOTE — Progress Notes (Signed)
[name], CANTER NO.:  [id number]  MEDICAL RECORD NO.:  [id number]  LOCATION:  IC01                          FACILITY:  [facility]  PHYSICIAN:  Mila Homer. [name], M.D.DATE OF BIRTH:  June 12, 1946  DATE OF PROCEDURE:  08/19/2012 DATE OF DISCHARGE:                                PROGRESS NOTE   SUBJECTIVE:  She is feeling about the same.  She had trouble sleeping last night.  She has had no chest pain or palpitations or shortness of breath.  OBJECTIVE:  GENERAL:  She is in the ICU.  Husband is with her in the room.  Her color is good.  Her speech is normal. VITAL SIGNS:  Temperature is 97.5, pulse 62, respiratory rate 18, blood pressure 118/78, O2 SATs 97%. HEART:  Her heart has an irregular irregularity with a rate of about 65. LUNGS:  Decreased breath sounds, but she is moving air well. ABDOMEN:  Soft.  LABORATORY DATA:  BMP showed a BUN 54, creatinine 1.67.  Her INR has dropped to 4.64.  ASSESSMENT: 1. Atrial fibrillation with rapid ventricular response. 2. Chronic, significant, systolic heart failure. 3. Nonischemic cardiomyopathy. 4. Type 2 diabetes. 5. Benign essential hypertension.  PLAN:  We will continue current meds.  She may have a cardioversion today through Dr. Dietrich Pates, her cardiologist.  We will continue treating the diabetes and other issues.     Mila Homer. [name], M.D.     SDK/MEDQ  D:  08/19/2012  T:  08/19/2012  Job:  [id number]

## 2012-08-23 NOTE — Addendum Note (Signed)
Addendum created 08/23/12 1044 by Franco Nones, CRNA   Modules edited: Charges VN

## 2012-08-25 LAB — CBC
HCT: 37.3 % (ref 36.0–46.0)
Hemoglobin: 12.7 g/dL (ref 12.0–15.0)
MCH: 30.3 pg (ref 26.0–34.0)
MCHC: 34 g/dL (ref 30.0–36.0)
MCV: 89 fL (ref 78.0–100.0)
RDW: 13.8 % (ref 11.5–15.5)

## 2012-08-25 LAB — BASIC METABOLIC PANEL
CO2: 26 mEq/L (ref 19–32)
Chloride: 102 mEq/L (ref 96–112)
Glucose, Bld: 195 mg/dL — ABNORMAL HIGH (ref 70–99)
Potassium: 4.2 mEq/L (ref 3.5–5.3)
Sodium: 140 mEq/L (ref 135–145)

## 2012-08-26 ENCOUNTER — Telehealth: Payer: Self-pay | Admitting: Adult Health

## 2012-08-26 ENCOUNTER — Encounter: Payer: Self-pay | Admitting: *Deleted

## 2012-08-26 NOTE — Telephone Encounter (Signed)
Results of labwork done yesterday. / tgs

## 2012-08-30 NOTE — Telephone Encounter (Signed)
Spoke to pt to advise results/instructions. Pt understood. Spoke to pt spouse and advised letter has been mailed and PCP alerted

## 2012-09-20 ENCOUNTER — Ambulatory Visit (INDEPENDENT_AMBULATORY_CARE_PROVIDER_SITE_OTHER): Payer: Medicare Other | Admitting: Adult Health

## 2012-09-20 ENCOUNTER — Encounter: Payer: Self-pay | Admitting: Adult Health

## 2012-09-20 VITALS — BP 110/68 | HR 72 | Ht 65.0 in | Wt 169.0 lb

## 2012-09-20 DIAGNOSIS — I509 Heart failure, unspecified: Secondary | ICD-10-CM

## 2012-09-20 DIAGNOSIS — D649 Anemia, unspecified: Secondary | ICD-10-CM

## 2012-09-20 DIAGNOSIS — I10 Essential (primary) hypertension: Secondary | ICD-10-CM

## 2012-09-20 DIAGNOSIS — I5022 Chronic systolic (congestive) heart failure: Secondary | ICD-10-CM

## 2012-09-20 DIAGNOSIS — I4891 Unspecified atrial fibrillation: Secondary | ICD-10-CM

## 2012-09-20 NOTE — Patient Instructions (Addendum)
Your physician recommends that you schedule a follow-up appointment in:   6 weeks.   Your physician has requested that you have an echocardiogram. Echocardiography is a painless test that uses sound waves to create images of your heart. It provides your doctor with information about the size and shape of your heart and how well your heart's chambers and valves are working. This procedure takes approximately one hour. There are no restrictions for this procedure.    

## 2012-09-20 NOTE — Assessment & Plan Note (Signed)
Recent labs have been completed within the last month she remains stable.

## 2012-09-20 NOTE — Progress Notes (Deleted)
Name: ZONIA CAPLIN    DOB: [birth year]  Age: 67 y.o.  MR#: [id number]       PCP:  Milana Obey, MD      Insurance: Payor: [employer] [place] [employer]  Plan: BLUE MEDICARE  Product Type: *No Product type*    CC:    Chief Complaint  Patient presents with  . Atrial Fibrillation  . Cardiomyopathy    NICM    VS Filed Vitals:   09/20/12 1337  BP: 110/68  Pulse: 72  Height: 5\' 5"  (1.651 m)  Weight: 169 lb (76.658 kg)  SpO2: 96%    Weights Current Weight  09/20/12 169 lb (76.658 kg)  08/23/12 164 lb (74.39 kg)  08/20/12 166 lb 7.2 oz (75.5 kg)    Blood Pressure  BP Readings from Last 3 Encounters:  09/20/12 110/68  08/23/12 130/60  08/22/12 110/55     Admit date:  (Not on file) Last encounter with RMR:  08/26/2012   Allergy Review of patient's allergies indicates no known allergies.  Current Outpatient Prescriptions  Medication Sig Dispense Refill  . carvedilol (COREG) 25 MG tablet Take 25 mg by mouth 2 (two) times daily.        . digoxin (LANOXIN) 0.125 MG tablet Take 1 tablet (0.125 mg total) by mouth daily.  30 tablet  5  . diphenhydramine-acetaminophen (TYLENOL PM EXTRA STRENGTH) 25-500 MG TABS Take 1 tablet by mouth at bedtime as needed (for sleep).       . furosemide (LASIX) 80 MG tablet Take 80 mg by mouth 2 (two) times daily. 1 tab am. 1/2 tab pm      . HYDROcodone-acetaminophen (NORCO) 10-325 MG per tablet Take 1 tablet by mouth 3 (three) times daily as needed for pain (for pain).      Marland Kitchen lisinopril (PRINIVIL,ZESTRIL) 10 MG tablet Take 0.5 tablets (5 mg total) by mouth daily.  30 tablet  5  . metFORMIN (GLUCOPHAGE) 1000 MG tablet Take 1,000 mg by mouth 2 (two) times daily.        Marland Kitchen oxyCODONE-acetaminophen (PERCOCET) 10-325 MG per tablet Take 1 tablet by mouth 4 (four) times daily as needed for pain. For severe breakthrough pain      . polyethylene glycol powder (GLYCOLAX/MIRALAX) powder Take 17 g by mouth daily as needed (constipation).       .  pravastatin (PRAVACHOL) 40 MG tablet Take 80 mg by mouth at bedtime.       Marland Kitchen trimethoprim (TRIMPEX) 100 MG tablet Take 100 mg by mouth daily.       Marland Kitchen warfarin (COUMADIN) 5 MG tablet Take 2.5-5 mg by mouth daily. Patient alternates with 2.5mg  daily with 5mg  daily       No current facility-administered medications for this visit.    Discontinued Meds:    Medications Discontinued During This Encounter  Medication Reason  . sulfamethoxazole-trimethoprim (BACTRIM DS) 800-160 MG per tablet Error    Patient Active Problem List  Diagnosis  . HYPERLIPIDEMIA  . HYPERTENSION  . LBBB  . URINARY INCONTINENCE  . Cardiomyopathy, nonischemic  . Enterococcal infection  . Pulmonary embolism  . Diabetes mellitus, type 2  . GERD (gastroesophageal reflux disease)  . Tobacco abuse, in remission  . Anemia, normocytic normochromic  . Obstructive sleep apnea  . Degenerative joint disease  . Chronic kidney disease  . Villous adenoma of colon  . Atrial fibrillation, persistent  . Chronic systolic congestive heart failure, NYHA class 3  . Chronic anticoagulation  .  Diarrhea    LABS    Component Value Date/Time   NA 140 08/25/2012 0915   NA 140 08/22/2012 0509   NA 140 08/21/2012 0506   K 4.2 08/25/2012 0915   K 3.7 08/22/2012 0509   K 3.5 08/21/2012 0506   CL 102 08/25/2012 0915   CL 101 08/22/2012 0509   CL 102 08/21/2012 0506   CO2 26 08/25/2012 0915   CO2 28 08/22/2012 0509   CO2 28 08/21/2012 0506   GLUCOSE 195* 08/25/2012 0915   GLUCOSE 134* 08/22/2012 0509   GLUCOSE 123* 08/21/2012 0506   BUN 41* 08/25/2012 0915   BUN 28* 08/22/2012 0509   BUN 32* 08/21/2012 0506   CREATININE 1.56* 08/25/2012 0915   CREATININE 1.28* 08/22/2012 0509   CREATININE 1.58* 08/21/2012 0506   CREATININE 1.50* 08/20/2012 0440   CREATININE 1.20* 07/22/2011 1011   CREATININE 1.38* 10/18/2010 0835   CALCIUM 10.2 08/25/2012 0915   CALCIUM 10.0 08/22/2012 0509   CALCIUM 10.0 08/21/2012 0506   GFRNONAA 42* 08/22/2012 0509   GFRNONAA 33* 08/21/2012 0506    GFRNONAA 35* 08/20/2012 0440   GFRAA 49* 08/22/2012 0509   GFRAA 38* 08/21/2012 0506   GFRAA 40* 08/20/2012 0440   CMP     Component Value Date/Time   NA 140 08/25/2012 0915   K 4.2 08/25/2012 0915   CL 102 08/25/2012 0915   CO2 26 08/25/2012 0915   GLUCOSE 195* 08/25/2012 0915   BUN 41* 08/25/2012 0915   CREATININE 1.56* 08/25/2012 0915   CREATININE 1.28* 08/22/2012 0509   CALCIUM 10.2 08/25/2012 0915   PROT 7.7 08/03/2012 0149   ALBUMIN 4.1 08/03/2012 0149   AST 13 08/03/2012 0149   ALT 15 08/03/2012 0149   ALKPHOS 80 08/03/2012 0149   BILITOT 0.9 08/03/2012 0149   GFRNONAA 42* 08/22/2012 0509   GFRAA 49* 08/22/2012 0509       Component Value Date/Time   WBC 8.0 08/25/2012 0915   WBC 8.1 08/18/2012 1551   WBC 6.2 08/09/2012 0522   HGB 12.7 08/25/2012 0915   HGB 12.1 08/18/2012 1551   HGB 12.6 08/09/2012 0522   HCT 37.3 08/25/2012 0915   HCT 36.2 08/18/2012 1551   HCT 37.6 08/09/2012 0522   MCV 89.0 08/25/2012 0915   MCV 93.1 08/18/2012 1551   MCV 93.3 08/09/2012 0522    Lipid Panel     Component Value Date/Time   CHOL 171 05/27/2010   TRIG 154 05/27/2010   HDL 38 05/27/2010   CHOLHDL 7.7 Ratio 07/16/2007 0000   VLDL 64* 07/16/2007 0000   LDLCALC 102 05/27/2010    ABG    Component Value Date/Time   PHART 7.237* 04/09/2008 2015   PCO2ART 43.7 04/09/2008 2015   PO2ART 315.0* 04/09/2008 2015   HCO3 17.9* 04/09/2008 2015   TCO2 21 04/09/2008 2037   ACIDBASEDEF 8.1* 04/09/2008 2015   O2SAT 99.5 04/09/2008 2015     Lab Results  Component Value Date   TSH 1.388 08/03/2012   BNP (last 3 results)  Recent Labs  08/02/12 2151  PROBNP 27771.0*   Cardiac Panel (last 3 results) No results found for this basename: CKTOTAL, CKMB, TROPONINI, RELINDX,  in the last 72 hours  Iron/TIBC/Ferritin    Component Value Date/Time   IRON 51 07/22/2011 1011   TIBC 294 07/22/2011 1011   FERRITIN 71 07/22/2011 1011     EKG Orders placed in visit on 08/23/12  . EKG 12-LEAD     Prior Assessment  and Plan Problem List  as of 09/20/2012     ICD-9-CM     Cardiology Problems   HYPERLIPIDEMIA   Last Assessment & Plan   07/14/2011 Office Visit Written 07/14/2011 12:59 PM by Kathlen Brunswick, MD     Most recent lipid profile in 11/2010 showed very adequate values, especially in light of the absence of known vascular disease.    HYPERTENSION   Last Assessment & Plan   08/23/2012 Office Visit Written 08/23/2012  3:41 PM by Jodelle Gross, NP     Currently well controlled on ACE. She will have labs completed. No complaints of dizziness or orthostasis.    LBBB   Cardiomyopathy, nonischemic   Last Assessment & Plan   05/10/2012 Office Visit Written 05/10/2012 12:20 PM by Kathlen Brunswick, MD     Stable cardiomyopathy with compensated congestive heart failure.  Current medications will be continued.    Pulmonary embolism   Last Assessment & Plan   01/14/2011 Office Visit Written 01/14/2011  8:17 PM by Kathlen Brunswick, MD     She continues to remain free of thromboembolic disease without requiring full anticoagulation.    Atrial fibrillation, persistent   Last Assessment & Plan   08/23/2012 Office Visit Edited 08/23/2012  3:44 PM by Jodelle Gross, NP     She is status post TEE/DCCV per Dr. Dietrich Pates on 08/20/2012 and has maintained NSR on carvedilol and digoxin. I will check BMET and CBC with use of coumadin and digoxin for ongoing assessment of kidney fx and for anemia. She will follow up in one month with Dr.Rothbart. Will eventually need a repeat echo now that she has regained NSR and atrial kick to reassess LV fx with prior systolic dysfunction on recent echo one month ago. Hemoccult cards will be provided.    Chronic systolic congestive heart failure, NYHA class 3   Last Assessment & Plan   08/23/2012 Office Visit Written 08/23/2012  3:42 PM by Jodelle Gross, NP     No evidence of fluid retention or decompensation. She continues on daily lasix doses. BMET to follow.      Other   URINARY INCONTINENCE    Enterococcal infection   Diabetes mellitus, type 2   Last Assessment & Plan   05/10/2012 Office Visit Written 05/10/2012 12:22 PM by Kathlen Brunswick, MD     Patient reports CBGs less than 150, but is unaware of any recent A1c determination.  Most recent value available to me was 6.8 in 11/2010, indicating good control at that time.    GERD (gastroesophageal reflux disease)   Tobacco abuse, in remission   Last Assessment & Plan   07/14/2011 Office Visit Written 07/14/2011  1:01 PM by Kathlen Brunswick, MD     Patient reports minimal cigarette smoking since 2010.  She is encouraged to avoid tobacco use entirely.    Anemia, normocytic normochromic   Last Assessment & Plan   05/10/2012 Office Visit Written 05/10/2012 12:20 PM by Kathlen Brunswick, MD     No evidence for iron deficiency, occult blood loss or folate deficiency; B12 is low normal.  Stable minimal anemia does not appear to require further evaluation at this time.  CBC will be repeated.    Obstructive sleep apnea   Degenerative joint disease   Last Assessment & Plan   07/14/2011 Office Visit Edited 07/18/2011  7:12 PM by Kathlen Brunswick, MD     Degenerative joint disease of the hip continues  to significantly impair her quality of life, but Dalyla appears to have come to an accommodation with her disease.  She uses a walker with adequate mobility and does not wish to reconsider orthopaedic surgery.  No falls have occurred for at least the past 6 months.    Chronic kidney disease   Last Assessment & Plan   05/10/2012 Office Visit Written 05/10/2012 12:21 PM by Kathlen Brunswick, MD     Renal function has improved since 2009 and is now near normal.    Villous adenoma of colon   Chronic anticoagulation   Diarrhea   Last Assessment & Plan   08/23/2012 Office Visit Written 08/23/2012  3:44 PM by Jodelle Gross, NP     Will check stool sample for C-Diff post hospitalization due to new onset. Doubt medication side effect at this time.  Will reassess on follow up visit. She is placed on fiber tablets as well.        Imaging: No results found.

## 2012-09-20 NOTE — Assessment & Plan Note (Signed)
Control of her pressure. She will continue on current medication regimen to include carvedilol, Lasix, and lisinopril.

## 2012-09-20 NOTE — Assessment & Plan Note (Signed)
She remains in normal sinus rhythm status post DC CV in February 2014. She remains on anticoagulation. She is followed by Dr. [name]. No evidence of fluid retention, or CHF. She remains well compensated. I will not make any changes at this time in her medication regimen. I will repeat echocardiogram in one month for evaluation of LV function in the setting of normal sinus rhythm after cardioversion from atrial fibrillation. She will see Dr. Dietrich Pates in 6 weeks.

## 2012-09-20 NOTE — Progress Notes (Signed)
HPI: [name] is a 67 year old female patient of Dr. Dietrich [name] we are following for ongoing assessment and management of atrial fibrillation, with a history of nonischemic heart myopathy, EF of 20% with severely dilated left atrium. During admission in March of 2014, the patient was treated with a TEE cardioversion and successful conversion to normal sinus rhythm. She was placed on digoxin and carvedilol 25 mg twice a day along with Coumadin. Systolic Dr. Sudie [name] for dosing of Coumadin and INR checks. She was seen last on 08/23/2012 where followup BMET was requested for evaluation of kidney function. At that time creatinine was 1.56 with a sodium 140 and potassium 4.2. Hemoglobin was normal at 12.7 and 37.3.    She is without complaint today of palpitations fluid retention shortness of breath, or dyspnea on exertion. She is medically compliant and states that she has felt so much better chest and she has remained in normal sinus rhythm.  No Known Allergies  Current Outpatient Prescriptions  Medication Sig Dispense Refill  . carvedilol (COREG) 25 MG tablet Take 25 mg by mouth 2 (two) times daily.        . digoxin (LANOXIN) 0.125 MG tablet Take 1 tablet (0.125 mg total) by mouth daily.  30 tablet  5  . diphenhydramine-acetaminophen (TYLENOL PM EXTRA STRENGTH) 25-500 MG TABS Take 1 tablet by mouth at bedtime as needed (for sleep).       . furosemide (LASIX) 80 MG tablet Take 80 mg by mouth 2 (two) times daily. 1 tab am. 1/2 tab pm      . HYDROcodone-acetaminophen (NORCO) 10-325 MG per tablet Take 1 tablet by mouth 3 (three) times daily as needed for pain (for pain).      Marland Kitchen lisinopril (PRINIVIL,ZESTRIL) 10 MG tablet Take 0.5 tablets (5 mg total) by mouth daily.  30 tablet  5  . metFORMIN (GLUCOPHAGE) 1000 MG tablet Take 1,000 mg by mouth 2 (two) times daily.        Marland Kitchen oxyCODONE-acetaminophen (PERCOCET) 10-325 MG per tablet Take 1 tablet by mouth 4 (four) times daily as needed for pain. For severe  breakthrough pain      . polyethylene glycol powder (GLYCOLAX/MIRALAX) powder Take 17 g by mouth daily as needed (constipation).       . pravastatin (PRAVACHOL) 40 MG tablet Take 80 mg by mouth at bedtime.       Marland Kitchen trimethoprim (TRIMPEX) 100 MG tablet Take 100 mg by mouth daily.       Marland Kitchen warfarin (COUMADIN) 5 MG tablet Take 2.5-5 mg by mouth daily. Patient alternates with 2.5mg  daily with 5mg  daily       No current facility-administered medications for this visit.    Past Medical History  Diagnosis Date  . Cardiomyopathy, nonischemic 10/07    EF 10% in 03/2006, 35% in 10/08 and 10-15% in 10/09 normal coronary angiography in 1999  . 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 Pulmonary embolism 2/07    after total right hip arthroplasty  . Hilar density     infrahilar mass/adenopathy on CT scan 5/07; subsequently  resolved  . Diabetes mellitus, type 2   . LBBB (left bundle branch block)   . GERD (gastroesophageal reflux disease)   . Hyperlipidemia   . Hypertension   . Tobacco abuse     discontinued in 1997, and then resumed  . Urinary incontinence   . Pneumonia     h/o pleural effusion  . Anemia     mild  and chronic  . Obstructive sleep apnea     mild-did not tolerate CPAP  . Degenerative joint disease     of knees, shoulder, and hips  . 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 Chronic kidney disease     creatinin-1.44 in 1/09; 1.51 in 1/10    Past Surgical History  Procedure Laterality Date  . Pacemaker removal  11/18/05    Enterococcal infection  . Total hip arthroplasty  2/07    Right  . Abdominal hysterectomy  1990/92    Initial partial hysterectomy followed by BSO  . Knee arthroscopy      Remote  . Colonoscopy w/ polypectomy  2009  . A-v cardiac pacemaker insertion  12/05    Biventricular pacemaker/AICD  . Cardioversion N/A 08/20/2012    Procedure: TEE GUIDED CARDIOVERSION;   Surgeon: Tina Brunswick, MD;  Location: [facility];  Service: Cardiovascular;  Laterality: N/A;  To be done @ bedside  . Tee without cardioversion N/A 08/20/2012    Procedure: TRANSESOPHAGEAL ECHOCARDIOGRAM (TEE);  Surgeon: Tina Brunswick, MD;  Location: [facility];  Service: Cardiovascular;  Laterality: N/A;    ZOX:WRUEAV of systems complete and found to be negative unless listed above  PHYSICAL EXAM BP 110/68  Pulse 72  Ht 5\' 5"  (1.651 m)  Wt 169 lb (76.658 kg)  BMI 28.12 kg/m2  SpO2 96%  General: Well developed, well nourished, in no acute distress Head: Eyes PERRLA, No xanthomas.   Normal cephalic and atramatic  Lungs: Clear bilaterally to auscultation and percussion. Heart: HRRR S1 S2, without MRG.  Pulses are 2+ & equal.            No carotid bruit. No JVD.  No abdominal bruits. No femoral bruits. Abdomen: Bowel sounds are positive, abdomen soft and non-tender without masses or                  Hernia's noted. Msk:  Back normal, normal gait. Normal strength and tone for age. Extremities: No clubbing, cyanosis or non-pitting edema in the right leg, no left leg edema.  DP +1 Neuro: Alert and oriented X 3. Psych:  Good affect, responds appropriately  EKG: NSR with LBBB. Rate of 70 bpm.  ASSESSMENT AND PLAN

## 2012-09-21 ENCOUNTER — Ambulatory Visit (INDEPENDENT_AMBULATORY_CARE_PROVIDER_SITE_OTHER): Payer: Medicare Other | Admitting: *Deleted

## 2012-09-21 DIAGNOSIS — Z7901 Long term (current) use of anticoagulants: Secondary | ICD-10-CM

## 2012-09-21 LAB — POC HEMOCCULT BLD/STL (HOME/3-CARD/SCREEN)
Card #3 Fecal Occult Blood, POC: NEGATIVE
Fecal Occult Blood, POC: NEGATIVE

## 2012-09-28 NOTE — Progress Notes (Signed)
This is a hemoccult encounter

## 2012-10-18 ENCOUNTER — Ambulatory Visit (HOSPITAL_COMMUNITY): Admission: RE | Admit: 2012-10-18 | Payer: Medicare Other | Source: Ambulatory Visit

## 2012-10-26 ENCOUNTER — Other Ambulatory Visit (HOSPITAL_COMMUNITY): Payer: Self-pay | Admitting: Family Medicine

## 2012-10-26 DIAGNOSIS — R109 Unspecified abdominal pain: Secondary | ICD-10-CM

## 2012-10-26 DIAGNOSIS — R112 Nausea with vomiting, unspecified: Secondary | ICD-10-CM

## 2012-10-27 ENCOUNTER — Ambulatory Visit (HOSPITAL_COMMUNITY)
Admission: RE | Admit: 2012-10-27 | Discharge: 2012-10-27 | Disposition: A | Discharge status: Home / Self Care | Payer: Medicare Other | Source: Ambulatory Visit | Attending: Family Medicine | Admitting: Family Medicine

## 2012-10-27 DIAGNOSIS — R112 Nausea with vomiting, unspecified: Secondary | ICD-10-CM

## 2012-10-27 DIAGNOSIS — I10 Essential (primary) hypertension: Secondary | ICD-10-CM | POA: Insufficient documentation

## 2012-10-27 DIAGNOSIS — R109 Unspecified abdominal pain: Secondary | ICD-10-CM

## 2012-10-27 DIAGNOSIS — E119 Type 2 diabetes mellitus without complications: Secondary | ICD-10-CM | POA: Insufficient documentation

## 2012-10-27 DIAGNOSIS — R932 Abnormal findings on diagnostic imaging of liver and biliary tract: Secondary | ICD-10-CM | POA: Insufficient documentation

## 2012-11-05 ENCOUNTER — Ambulatory Visit: Payer: Medicare Other | Admitting: Cardiology

## 2012-11-17 ENCOUNTER — Ambulatory Visit: Payer: Medicare Other | Admitting: Cardiology

## 2012-11-30 ENCOUNTER — Ambulatory Visit: Payer: Medicare Other | Admitting: Cardiology

## 2012-12-07 ENCOUNTER — Ambulatory Visit (HOSPITAL_COMMUNITY)
Admission: RE | Admit: 2012-12-07 | Discharge: 2012-12-07 | Disposition: A | Discharge status: Home / Self Care | Payer: Medicare Other | Source: Ambulatory Visit | Attending: Adult Health | Admitting: Adult Health

## 2012-12-07 DIAGNOSIS — I509 Heart failure, unspecified: Secondary | ICD-10-CM | POA: Insufficient documentation

## 2012-12-07 DIAGNOSIS — E119 Type 2 diabetes mellitus without complications: Secondary | ICD-10-CM | POA: Insufficient documentation

## 2012-12-07 DIAGNOSIS — I4891 Unspecified atrial fibrillation: Secondary | ICD-10-CM | POA: Insufficient documentation

## 2012-12-07 DIAGNOSIS — I10 Essential (primary) hypertension: Secondary | ICD-10-CM | POA: Insufficient documentation

## 2012-12-07 DIAGNOSIS — I359 Nonrheumatic aortic valve disorder, unspecified: Secondary | ICD-10-CM

## 2012-12-07 NOTE — Progress Notes (Signed)
*  PRELIMINARY RESULTS* Echocardiogram 2D Echocardiogram has been performed.  Conrad Crescent 12/07/2012, 11:22 AM

## 2012-12-10 ENCOUNTER — Ambulatory Visit: Payer: Medicare Other | Admitting: Cardiology

## 2012-12-29 ENCOUNTER — Ambulatory Visit (INDEPENDENT_AMBULATORY_CARE_PROVIDER_SITE_OTHER): Payer: Medicare Other | Admitting: Adult Health

## 2012-12-29 ENCOUNTER — Encounter: Payer: Self-pay | Admitting: Adult Health

## 2012-12-29 VITALS — BP 134/68 | HR 67 | Ht 65.0 in | Wt 166.0 lb

## 2012-12-29 DIAGNOSIS — I5022 Chronic systolic (congestive) heart failure: Secondary | ICD-10-CM

## 2012-12-29 DIAGNOSIS — I10 Essential (primary) hypertension: Secondary | ICD-10-CM

## 2012-12-29 DIAGNOSIS — I428 Other cardiomyopathies: Secondary | ICD-10-CM

## 2012-12-29 DIAGNOSIS — I4891 Unspecified atrial fibrillation: Secondary | ICD-10-CM

## 2012-12-29 DIAGNOSIS — I509 Heart failure, unspecified: Secondary | ICD-10-CM

## 2012-12-29 NOTE — Assessment & Plan Note (Signed)
She remains on optimal medical management with ACE, BB, diuretic, and digoxin. Her systolic function has worsened from 20% to 10-15% per echo in 11/2012. I am going to refer her to Dr. 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  I have discussed with her the likelihood of sudden cardiac death with continued severe systolic dysfunction. She would like to talk to Dr. Ladona Ridgel about possible implantation.Will defer to Dr.Taylor for his recommendations.

## 2012-12-29 NOTE — Progress Notes (Signed)
HPI: [name] is a 67 yr-old female patient of Dr. Dietrich Pates we are following for ongoing assessment and management of atrial fibrillation, with history of nonischemic cardiomyopathy with an EF of 20% and severely dilated left atrium. The patient was recently in the hospital in March of 2014 and had a TEE cardioversion with successful return to normal sinus rhythm. She was placed on dig and carvedilol along with Coumadin. The patient is followed by primary care for dosing of Coumadin. On last visit she was without complaints and remained in normal sinus rhythm. Echocardiogram was repeated to evaluate LV function since she has returned to normal sinus rhythm.    Echo completed June of 2014 demonstrated  Left ventricle: The cavity size was moderately dilated. There was mild concentric hypertrophy. Systolic function was severely reduced. The estimated ejection fraction was in the range of 15% to 20%. She has continued on coreg, lisinopril, digoxin, and lasix. She has no complaints of palpitations, dizziness or edema.but has some DOE. 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 She underwent 2 weeks of antibiotic therapy at that time. She remains on coumadin.    No Known Allergies  Current Outpatient Prescriptions  Medication Sig Dispense Refill  . carvedilol (COREG) 25 MG tablet Take 25 mg by mouth 2 (two) times daily.        . digoxin (LANOXIN) 0.125 MG tablet Take 1 tablet (0.125 mg total) by mouth daily.  30 tablet  5  . diphenhydramine-acetaminophen (TYLENOL PM EXTRA STRENGTH) 25-500 MG TABS Take 1 tablet by mouth at bedtime as needed (for sleep).       . furosemide (LASIX) 80 MG tablet Take 80 mg by mouth 2 (two) times daily. 1 tab am. 1/2 tab pm      . HYDROcodone-acetaminophen (NORCO) 10-325 MG per tablet Take 1 tablet by mouth  3 (three) times daily as needed for pain (for pain).      Marland Kitchen lisinopril (PRINIVIL,ZESTRIL) 10 MG tablet Take 0.5 tablets (5 mg total) by mouth daily.  30 tablet  5  . metFORMIN (GLUCOPHAGE) 1000 MG tablet Take 1,000 mg by mouth 2 (two) times daily.        Marland Kitchen oxyCODONE-acetaminophen (PERCOCET) 10-325 MG per tablet Take 1 tablet by mouth 4 (four) times daily as needed for pain. For severe breakthrough pain      . polyethylene glycol powder (GLYCOLAX/MIRALAX) powder Take 17 g by mouth daily as needed (constipation).       . pravastatin (PRAVACHOL) 40 MG tablet Take 80 mg by mouth at bedtime.       Marland Kitchen trimethoprim (TRIMPEX) 100 MG tablet Take 100 mg by mouth daily.       Marland Kitchen warfarin (COUMADIN) 5 MG tablet Take 2.5-5 mg by mouth daily. Patient alternates with 2.5mg  daily with 5mg  daily       No current facility-administered medications for this visit.    Past Medical History  Diagnosis Date  . Cardiomyopathy, nonischemic 10/07    EF 10% in 03/2006, 35% in 10/08 and 10-15% in 10/09 normal coronary angiography in 1999  . 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 Pulmonary embolism 2/07    after total right hip arthroplasty  . Hilar density     infrahilar mass/adenopathy on CT scan 5/07; subsequently  resolved  . Diabetes mellitus,  type 2   . LBBB (left bundle branch block)   . GERD (gastroesophageal reflux disease)   . Hyperlipidemia   . Hypertension   . Tobacco abuse     discontinued in 1997, and then resumed  . Urinary incontinence   . Pneumonia     h/o pleural effusion  . Anemia     mild and chronic  . Obstructive sleep apnea     mild-did not tolerate CPAP  . Degenerative joint disease     of knees, shoulder, and hips  . 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 Chronic kidney disease     creatinin-1.44 in 1/09; 1.51 in 1/10    Past Surgical History  Procedure Laterality Date  . Pacemaker  removal  11/18/05    Enterococcal infection  . Total hip arthroplasty  2/07    Right  . Abdominal hysterectomy  1990/92    Initial partial hysterectomy followed by BSO  . Knee arthroscopy      Remote  . Colonoscopy w/ polypectomy  2009  . A-v cardiac pacemaker insertion  12/05    Biventricular pacemaker/AICD  . Cardioversion N/A 08/20/2012    Procedure: TEE GUIDED CARDIOVERSION;  Surgeon: Kathlen Brunswick, MD;  Location: [facility];  Service: Cardiovascular;  Laterality: N/A;  To be done @ bedside  . Tee without cardioversion N/A 08/20/2012    Procedure: TRANSESOPHAGEAL ECHOCARDIOGRAM (TEE);  Surgeon: Kathlen Brunswick, MD;  Location: [facility];  Service: Cardiovascular;  Laterality: N/A;    ZOX:WRUEAV of systems complete and found to be negative unless listed above  PHYSICAL EXAM BP 134/68  Pulse 67  Ht 5\' 5"  (1.651 m)  Wt 166 lb (75.297 kg)  BMI 27.62 kg/m2  General: Well developed, well nourished, in no acute distress Head: Eyes PERRLA, No xanthomas.   Normal cephalic and atramatic  Lungs: Clear bilaterally to auscultation and percussion. Heart: HRRR S1 S2, with soft systolic murmur. Pulses are 2+ & equal.            No carotid bruit. No JVD.  No abdominal bruits. . Abdomen: Bowel sounds are positive, abdomen soft and non-tender without masses or                  Hernia's noted. Msk:  Back normal, normal gait. Normal strength and tone for age. Extremities: No clubbing, cyanosis or edema.  DP +1 Neuro: Alert and oriented X 3. Psych:  Good affect, responds appropriately    ASSESSMENT AND PLAN

## 2012-12-29 NOTE — Assessment & Plan Note (Signed)
She is controlled but not optimal with severe systolic dysfunction. Would increase lisinopril to 20 mg daily. She had not taken lasix today due to appt. Will take it when she gets home.

## 2012-12-29 NOTE — Assessment & Plan Note (Signed)
She is well compensated at this time without clinical evidence of fluid overload. Will continue current management and consider adding spironolactone on next visit. Repeat labs.

## 2012-12-29 NOTE — Progress Notes (Deleted)
Name: [name]    DOB: 26-Jan-1946  Age: 67 y.o.  MR#: [id number]       PCP:  Milana Obey, MD      Insurance: Payor: [employer] [place] [employer] / Plan: BLUE MEDICARE / Product Type: *No Product type* /   CC:    Chief Complaint  Patient presents with  . Atrial Fibrillation  . Congestive Heart Failure    VS Filed Vitals:   12/29/12 1512  BP: 134/68  Pulse: 67  Height: 5\' 5"  (1.651 m)  Weight: 166 lb (75.297 kg)    Weights Current Weight  12/29/12 166 lb (75.297 kg)  09/20/12 169 lb (76.658 kg)  08/23/12 164 lb (74.39 kg)    Blood Pressure  BP Readings from Last 3 Encounters:  12/29/12 134/68  09/20/12 110/68  08/23/12 130/60     Admit date:  (Not on file) Last encounter with RMR:  09/20/2012   Allergy Review of patient's allergies indicates no known allergies.  Current Outpatient Prescriptions  Medication Sig Dispense Refill  . carvedilol (COREG) 25 MG tablet Take 25 mg by mouth 2 (two) times daily.        . digoxin (LANOXIN) 0.125 MG tablet Take 1 tablet (0.125 mg total) by mouth daily.  30 tablet  5  . diphenhydramine-acetaminophen (TYLENOL PM EXTRA STRENGTH) 25-500 MG TABS Take 1 tablet by mouth at bedtime as needed (for sleep).       . furosemide (LASIX) 80 MG tablet Take 80 mg by mouth 2 (two) times daily. 1 tab am. 1/2 tab pm      . HYDROcodone-acetaminophen (NORCO) 10-325 MG per tablet Take 1 tablet by mouth 3 (three) times daily as needed for pain (for pain).      Marland Kitchen lisinopril (PRINIVIL,ZESTRIL) 10 MG tablet Take 0.5 tablets (5 mg total) by mouth daily.  30 tablet  5  . metFORMIN (GLUCOPHAGE) 1000 MG tablet Take 1,000 mg by mouth 2 (two) times daily.        Marland Kitchen oxyCODONE-acetaminophen (PERCOCET) 10-325 MG per tablet Take 1 tablet by mouth 4 (four) times daily as needed for pain. For severe breakthrough pain      . polyethylene glycol powder (GLYCOLAX/MIRALAX) powder Take 17 g by mouth daily as needed (constipation).       . pravastatin  (PRAVACHOL) 40 MG tablet Take 80 mg by mouth at bedtime.       Marland Kitchen trimethoprim (TRIMPEX) 100 MG tablet Take 100 mg by mouth daily.       Marland Kitchen warfarin (COUMADIN) 5 MG tablet Take 2.5-5 mg by mouth daily. Patient alternates with 2.5mg  daily with 5mg  daily       No current facility-administered medications for this visit.    Discontinued Meds:   There are no discontinued medications.  Patient Active Problem List   Diagnosis Date Noted  . Diarrhea 08/23/2012  . Chronic anticoagulation 08/19/2012  . Chronic systolic congestive heart failure, NYHA class 3 08/03/2012  . Cardiomyopathy, nonischemic   . Enterococcal infection   . Pulmonary embolism   . Diabetes mellitus, type 2   . GERD (gastroesophageal reflux disease)   . Tobacco abuse, in remission   . Anemia, normocytic normochromic   . Obstructive sleep apnea   . Degenerative joint disease   . Chronic kidney disease   . Villous adenoma of colon   . URINARY INCONTINENCE 07/12/2010  . LBBB 01/29/2009  . HYPERLIPIDEMIA 06/04/2007  . HYPERTENSION 06/04/2007    LABS  Component Value Date/Time   NA 140 08/25/2012 0915   NA 140 08/22/2012 0509   NA 140 08/21/2012 0506   K 4.2 08/25/2012 0915   K 3.7 08/22/2012 0509   K 3.5 08/21/2012 0506   CL 102 08/25/2012 0915   CL 101 08/22/2012 0509   CL 102 08/21/2012 0506   CO2 26 08/25/2012 0915   CO2 28 08/22/2012 0509   CO2 28 08/21/2012 0506   GLUCOSE 195* 08/25/2012 0915   GLUCOSE 134* 08/22/2012 0509   GLUCOSE 123* 08/21/2012 0506   BUN 41* 08/25/2012 0915   BUN 28* 08/22/2012 0509   BUN 32* 08/21/2012 0506   CREATININE 1.56* 08/25/2012 0915   CREATININE 1.28* 08/22/2012 0509   CREATININE 1.58* 08/21/2012 0506   CREATININE 1.50* 08/20/2012 0440   CREATININE 1.20* 07/22/2011 1011   CREATININE 1.38* 10/18/2010 0835   CALCIUM 10.2 08/25/2012 0915   CALCIUM 10.0 08/22/2012 0509   CALCIUM 10.0 08/21/2012 0506   GFRNONAA 42* 08/22/2012 0509   GFRNONAA 33* 08/21/2012 0506   GFRNONAA 35* 08/20/2012 0440   GFRAA 49* 08/22/2012 0509    GFRAA 38* 08/21/2012 0506   GFRAA 40* 08/20/2012 0440   CMP     Component Value Date/Time   NA 140 08/25/2012 0915   K 4.2 08/25/2012 0915   CL 102 08/25/2012 0915   CO2 26 08/25/2012 0915   GLUCOSE 195* 08/25/2012 0915   BUN 41* 08/25/2012 0915   CREATININE 1.56* 08/25/2012 0915   CREATININE 1.28* 08/22/2012 0509   CALCIUM 10.2 08/25/2012 0915   PROT 7.7 08/03/2012 0149   ALBUMIN 4.1 08/03/2012 0149   AST 13 08/03/2012 0149   ALT 15 08/03/2012 0149   ALKPHOS 80 08/03/2012 0149   BILITOT 0.9 08/03/2012 0149   GFRNONAA 42* 08/22/2012 0509   GFRAA 49* 08/22/2012 0509       Component Value Date/Time   WBC 8.0 08/25/2012 0915   WBC 8.1 08/18/2012 1551   WBC 6.2 08/09/2012 0522   HGB 12.7 08/25/2012 0915   HGB 12.1 08/18/2012 1551   HGB 12.6 08/09/2012 0522   HCT 37.3 08/25/2012 0915   HCT 36.2 08/18/2012 1551   HCT 37.6 08/09/2012 0522   MCV 89.0 08/25/2012 0915   MCV 93.1 08/18/2012 1551   MCV 93.3 08/09/2012 0522    Lipid Panel     Component Value Date/Time   CHOL 171 05/27/2010   TRIG 154 05/27/2010   HDL 38 05/27/2010   CHOLHDL 7.7 Ratio 07/16/2007 0000   VLDL 64* 07/16/2007 0000   LDLCALC 102 05/27/2010    ABG    Component Value Date/Time   PHART 7.237* 04/09/2008 2015   PCO2ART 43.7 04/09/2008 2015   PO2ART 315.0* 04/09/2008 2015   HCO3 17.9* 04/09/2008 2015   TCO2 21 04/09/2008 2037   ACIDBASEDEF 8.1* 04/09/2008 2015   O2SAT 99.5 04/09/2008 2015     Lab Results  Component Value Date   TSH 1.388 08/03/2012   BNP (last 3 results)  Recent Labs  08/02/12 2151  PROBNP 27771.0*   Cardiac Panel (last 3 results) No results found for this basename: CKTOTAL, CKMB, TROPONINI, RELINDX,  in the last 72 hours  Iron/TIBC/Ferritin    Component Value Date/Time   IRON 51 07/22/2011 1011   TIBC 294 07/22/2011 1011   FERRITIN 71 07/22/2011 1011     EKG Orders placed in visit on 09/20/12  . EKG 12-LEAD     Prior Assessment and Plan Problem List as of 12/29/2012  Cardiovascular and Mediastinum    HYPERTENSION   Last Assessment & Plan   09/20/2012 Office Visit Written 09/20/2012  2:02 PM by Jodelle Gross, NP     Control of her pressure. She will continue on current medication regimen to include carvedilol, Lasix, and lisinopril.    LBBB   Cardiomyopathy, nonischemic   Last Assessment & Plan   05/10/2012 Office Visit Written 05/10/2012 12:20 PM by Kathlen Brunswick, MD     Stable cardiomyopathy with compensated congestive heart failure.  Current medications will be continued.    Pulmonary embolism   Last Assessment & Plan   01/14/2011 Office Visit Written 01/14/2011  8:17 PM by Kathlen Brunswick, MD     She continues to remain free of thromboembolic disease without requiring full anticoagulation.    Chronic systolic congestive heart failure, NYHA class 3   Last Assessment & Plan   09/20/2012 Office Visit Written 09/20/2012  2:01 PM by Jodelle Gross, NP     She remains in normal sinus rhythm status post DC CV in February 2014. She remains on anticoagulation. She is followed by Dr. [name]. No evidence of fluid retention, or CHF. She remains well compensated. I will not make any changes at this time in her medication regimen. I will repeat echocardiogram in one month for evaluation of LV function in the setting of normal sinus rhythm after cardioversion from atrial fibrillation. She will see Dr. Dietrich Pates in 6 weeks.      Respiratory   Obstructive sleep apnea     Digestive   GERD (gastroesophageal reflux disease)   Villous adenoma of colon     Endocrine   Diabetes mellitus, type 2   Last Assessment & Plan   05/10/2012 Office Visit Written 05/10/2012 12:22 PM by Kathlen Brunswick, MD     Patient reports CBGs less than 150, but is unaware of any recent A1c determination.  Most recent value available to me was 6.8 in 11/2010, indicating good control at that time.      Musculoskeletal and Integument   Degenerative joint disease   Last Assessment & Plan   07/14/2011 Office  Visit Edited 07/18/2011  7:12 PM by Kathlen Brunswick, MD     Degenerative joint disease of the hip continues to significantly impair her quality of life, but Shirly appears to have come to an accommodation with her disease.  She uses a walker with adequate mobility and does not wish to reconsider orthopaedic surgery.  No falls have occurred for at least the past 6 months.      Genitourinary   Chronic kidney disease   Last Assessment & Plan   05/10/2012 Office Visit Written 05/10/2012 12:21 PM by Kathlen Brunswick, MD     Renal function has improved since 2009 and is now near normal.      Other   HYPERLIPIDEMIA   Last Assessment & Plan   07/14/2011 Office Visit Written 07/14/2011 12:59 PM by Kathlen Brunswick, MD     Most recent lipid profile in 11/2010 showed very adequate values, especially in light of the absence of known vascular disease.    URINARY INCONTINENCE   Enterococcal infection   Tobacco abuse, in remission   Last Assessment & Plan   07/14/2011 Office Visit Written 07/14/2011  1:01 PM by Kathlen Brunswick, MD     Patient reports minimal cigarette smoking since 2010.  She is encouraged to avoid tobacco use entirely.    Anemia, normocytic normochromic  Last Assessment & Plan   09/20/2012 Office Visit Written 09/20/2012  2:03 PM by Jodelle Gross, NP     Recent labs have been completed within the last month she remains stable.    Chronic anticoagulation   Diarrhea   Last Assessment & Plan   08/23/2012 Office Visit Written 08/23/2012  3:44 PM by Jodelle Gross, NP     Will check stool sample for C-Diff post hospitalization due to new onset. Doubt medication side effect at this time. Will reassess on follow up visit. She is placed on fiber tablets as well.        Imaging: No results found.

## 2012-12-29 NOTE — Patient Instructions (Addendum)
Your physician recommends that you schedule a follow-up appointment in: 1-2 weeks with Dr Ladona Ridgel

## 2013-01-05 ENCOUNTER — Ambulatory Visit (INDEPENDENT_AMBULATORY_CARE_PROVIDER_SITE_OTHER): Payer: Medicare Other | Admitting: Internal Medicine

## 2013-01-05 ENCOUNTER — Encounter: Payer: Self-pay | Admitting: *Deleted

## 2013-01-05 ENCOUNTER — Encounter: Payer: Self-pay | Admitting: Internal Medicine

## 2013-01-05 VITALS — BP 138/78 | HR 69 | Wt 169.0 lb

## 2013-01-05 DIAGNOSIS — I10 Essential (primary) hypertension: Secondary | ICD-10-CM

## 2013-01-05 DIAGNOSIS — I4891 Unspecified atrial fibrillation: Secondary | ICD-10-CM

## 2013-01-05 DIAGNOSIS — D649 Anemia, unspecified: Secondary | ICD-10-CM

## 2013-01-05 DIAGNOSIS — I509 Heart failure, unspecified: Secondary | ICD-10-CM

## 2013-01-05 DIAGNOSIS — I5022 Chronic systolic (congestive) heart failure: Secondary | ICD-10-CM

## 2013-01-05 DIAGNOSIS — I43 Cardiomyopathy in diseases classified elsewhere: Secondary | ICD-10-CM

## 2013-01-05 DIAGNOSIS — I4819 Other persistent atrial fibrillation: Secondary | ICD-10-CM

## 2013-01-05 MED ORDER — LISINOPRIL 10 MG PO TABS: 5.0000 mg | ORAL_TABLET | Freq: Every day | ORAL | Status: DC

## 2013-01-05 NOTE — Patient Instructions (Addendum)
Your physician has recommended that you have a bi-v defibrillator inserted. An implantable cardioverter defibrillator (ICD) is a small device that is placed in your chest or, in rare cases, your abdomen. This device uses electrical pulses or shocks to help control life-threatening, irregular heartbeats that could lead the heart to suddenly stop beating (sudden cardiac arrest). Leads are attached to the ICD that goes into your heart. This is done in the hospital and usually requires an overnight stay. Please see the instruction sheet given to you today for more information.  See instruction sheet

## 2013-01-05 NOTE — Assessment & Plan Note (Signed)
I discussed the treatment options with the patient. She has a class I indication for biventricular ICD with left bundle branch block and a QRS duration of greater than 150 ms, severe left ventricular dysfunction with an ejection fraction of 15%, despite maximal medical therapy, with her left ventricular dysfunction been present for over 6 years. I discussed the risk, goals, benefits, and expectations of biventricular ICD implantation with the patient and she wishes to proceed. This be scheduled early as possible pending at time.

## 2013-01-05 NOTE — Progress Notes (Signed)
HPI [name] is referred today for consideration for BiV ICD insertion. The patient has a h/o long standing LV dysfunction dating back to 2006. She had an ICD extracted in 2007 after developing enterococcus bacteremia and a vegetation on her ICD lead which was thought to be a consequence of hip surgery. Since then she has been stable except for worsening LV dysfunction and CHF. She has also developed PAF and undergone DCCV. She is on maximal medical therapy with Carvedilol, digoxin, lisinopril and lasix. Despite this she is still sob with class 3 heart failure symptoms. She appears to be holding NSR. No Known Allergies   Current Outpatient Prescriptions  Medication Sig Dispense Refill  . carvedilol (COREG) 25 MG tablet Take 25 mg by mouth 2 (two) times daily.        . digoxin (LANOXIN) 0.125 MG tablet Take 1 tablet (0.125 mg total) by mouth daily.  30 tablet  5  . diphenhydramine-acetaminophen (TYLENOL PM EXTRA STRENGTH) 25-500 MG TABS Take 1 tablet by mouth at bedtime as needed (for sleep).       . furosemide (LASIX) 80 MG tablet 1 tab am. 1/2 tab pm      . HYDROcodone-acetaminophen (NORCO) 10-325 MG per tablet Take 1 tablet by mouth 3 (three) times daily as needed for pain (for pain).      . metFORMIN (GLUCOPHAGE) 1000 MG tablet Take 1,000 mg by mouth 2 (two) times daily.        . metoCLOPramide (REGLAN) 5 MG tablet Take 5 mg by mouth 4 (four) times daily.      . polyethylene glycol powder (GLYCOLAX/MIRALAX) powder Take 17 g by mouth daily as needed (constipation).       . pravastatin (PRAVACHOL) 40 MG tablet Take 80 mg by mouth at bedtime.       Marland Kitchen warfarin (COUMADIN) 5 MG tablet Take 2.5-5 mg by mouth daily. Patient alternates with 2.5mg  daily with 5mg  daily      . lisinopril (PRINIVIL,ZESTRIL) 10 MG tablet Take 0.5 tablets (5 mg total) by mouth daily.  30 tablet  6   No current facility-administered medications for this visit.     Past Medical History  Diagnosis Date  .  Cardiomyopathy, nonischemic 10/07    EF 10% in 03/2006, 35% in 10/08 and 10-15% in 10/09 normal coronary angiography in 1999  . 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 Pulmonary embolism 2/07    after total right hip arthroplasty  . Hilar density     infrahilar mass/adenopathy on CT scan 5/07; subsequently  resolved  . Diabetes mellitus, type 2   . LBBB (left bundle branch block)   . GERD (gastroesophageal reflux disease)   . Hyperlipidemia   . Hypertension   . Tobacco abuse     discontinued in 1997, and then resumed  . Urinary incontinence   . Pneumonia     h/o pleural effusion  . Anemia     mild and chronic  . Obstructive sleep apnea     mild-did not tolerate CPAP  . Degenerative joint disease     of knees, shoulder, and hips  . 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 Chronic kidney disease     creatinin-1.44 in 1/09; 1.51 in 1/10    ROS:   All systems reviewed and negative except as noted in the HPI.   Past Surgical History  Procedure Laterality Date  .  Pacemaker removal  11/18/05    Enterococcal infection  . Total hip arthroplasty  2/07    Right  . Abdominal hysterectomy  1990/92    Initial partial hysterectomy followed by BSO  . Knee arthroscopy      Remote  . Colonoscopy w/ polypectomy  2009  . A-v cardiac pacemaker insertion  12/05    Biventricular pacemaker/AICD  . Cardioversion N/A 08/20/2012    Procedure: TEE GUIDED CARDIOVERSION;  Surgeon: Kathlen Brunswick, MD;  Location: [facility];  Service: Cardiovascular;  Laterality: N/A;  To be done @ bedside  . Tee without cardioversion N/A 08/20/2012    Procedure: TRANSESOPHAGEAL ECHOCARDIOGRAM (TEE);  Surgeon: Kathlen Brunswick, MD;  Location: [facility];  Service: Cardiovascular;  Laterality: N/A;     Family History  Problem Relation Age of Onset  . Hypertension Mother   . Diabetes Mother   . Coronary artery disease Father    . Diabetes Brother   . Hypertension Brother   . Lung cancer Brother   . Arthritis Other   . Diabetes Other   . Heart disease Other     female < 10     History   Social History  . Marital Status: Married    Spouse Name: N/A    Number of Children: N/A  . Years of Education: N/A   Occupational History  . Not on file.   Social History Main Topics  . Smoking status: Former Smoker    Types: Cigarettes    Quit date: 08/04/2007  . Smokeless tobacco: Never Used     Comment: 1 pack per month   . Alcohol Use: No  . Drug Use: No  . Sexually Active: Not Currently    Birth Control/ Protection: None   Other Topics Concern  . Not on file   Social History Narrative   Married, lives with spouse. Retired Advertising copywriter.      BP 138/78  Pulse 69  Wt 169 lb (76.658 kg)  BMI 28.12 kg/m2  Physical Exam:  Stable but chronically ill appearing NAD HEENT: Unremarkable Neck:  7 cm JVD, no thyromegally Back:  No CVA tenderness Lungs:  Clear with no wheezes HEART:  Regular rate rhythm, no murmurs, no rubs, no clicks Abd:  soft, positive bowel sounds, no organomegally, no rebound, no guarding Ext:  2 plus pulses, no edema, no cyanosis, no clubbing Skin:  No rashes no nodules Neuro:  CN II through XII intact, motor grossly intact  EKG - normal sinus rhythm with left bundle branch block   Assess/Plan:

## 2013-01-06 ENCOUNTER — Other Ambulatory Visit: Payer: Self-pay | Admitting: *Deleted

## 2013-01-06 DIAGNOSIS — I509 Heart failure, unspecified: Secondary | ICD-10-CM

## 2013-01-12 ENCOUNTER — Encounter (HOSPITAL_COMMUNITY): Payer: Self-pay | Admitting: Pharmacy Technician

## 2013-01-20 ENCOUNTER — Other Ambulatory Visit: Payer: Self-pay | Admitting: *Deleted

## 2013-01-20 DIAGNOSIS — Z01818 Encounter for other preprocedural examination: Secondary | ICD-10-CM

## 2013-01-20 LAB — CBC
HCT: 33.9 % — ABNORMAL LOW (ref 36.0–46.0)
Hemoglobin: 11.2 g/dL — ABNORMAL LOW (ref 12.0–15.0)
MCH: 30.4 pg (ref 26.0–34.0)
MCHC: 33 g/dL (ref 30.0–36.0)
MCV: 91.9 fL (ref 78.0–100.0)
RBC: 3.69 MIL/uL — ABNORMAL LOW (ref 3.87–5.11)

## 2013-01-20 LAB — APTT: aPTT: 40 seconds — ABNORMAL HIGH (ref 24–37)

## 2013-01-21 LAB — BASIC METABOLIC PANEL
Chloride: 100 mEq/L (ref 96–112)
Creat: 1.17 mg/dL — ABNORMAL HIGH (ref 0.50–1.10)

## 2013-01-21 LAB — PROTIME-INR
INR: 2.15 — ABNORMAL HIGH (ref <1.50)
Prothrombin Time: 23.3 seconds — ABNORMAL HIGH (ref 11.6–15.2)

## 2013-01-23 MED ORDER — SODIUM CHLORIDE 0.9 % IR SOLN
80.0000 mg | Status: DC
  Filled 2013-01-23: qty 2

## 2013-01-23 MED ORDER — SODIUM CHLORIDE 0.9 % IV SOLN
INTRAVENOUS | Status: DC
  Administered 2013-01-24: 07:00:00 via INTRAVENOUS

## 2013-01-23 MED ORDER — CHLORHEXIDINE GLUCONATE 4 % EX LIQD
60.0000 mL | Freq: Once | CUTANEOUS | Status: DC
  Filled 2013-01-23: qty 60

## 2013-01-23 MED ORDER — CEFAZOLIN SODIUM-DEXTROSE 2-3 GM-% IV SOLR
2.0000 g | INTRAVENOUS | Status: DC
  Filled 2013-01-23: qty 50

## 2013-01-24 ENCOUNTER — Encounter (HOSPITAL_COMMUNITY)
Admission: RE | Disposition: A | Discharge status: Home / Self Care | Payer: Self-pay | Source: Ambulatory Visit | Attending: Internal Medicine

## 2013-01-24 ENCOUNTER — Ambulatory Visit (HOSPITAL_COMMUNITY)
Admission: RE | Admit: 2013-01-24 | Discharge: 2013-01-25 | Disposition: A | Discharge status: Home / Self Care | Payer: Medicare Other | Source: Ambulatory Visit | Attending: Internal Medicine | Admitting: Internal Medicine

## 2013-01-24 ENCOUNTER — Encounter (HOSPITAL_COMMUNITY): Payer: Self-pay | Admitting: General Practice

## 2013-01-24 DIAGNOSIS — E785 Hyperlipidemia, unspecified: Secondary | ICD-10-CM | POA: Insufficient documentation

## 2013-01-24 DIAGNOSIS — I447 Left bundle-branch block, unspecified: Secondary | ICD-10-CM | POA: Insufficient documentation

## 2013-01-24 DIAGNOSIS — E119 Type 2 diabetes mellitus without complications: Secondary | ICD-10-CM | POA: Insufficient documentation

## 2013-01-24 DIAGNOSIS — I428 Other cardiomyopathies: Secondary | ICD-10-CM | POA: Insufficient documentation

## 2013-01-24 DIAGNOSIS — I5022 Chronic systolic (congestive) heart failure: Secondary | ICD-10-CM

## 2013-01-24 DIAGNOSIS — Z7901 Long term (current) use of anticoagulants: Secondary | ICD-10-CM | POA: Insufficient documentation

## 2013-01-24 DIAGNOSIS — I10 Essential (primary) hypertension: Secondary | ICD-10-CM | POA: Insufficient documentation

## 2013-01-24 DIAGNOSIS — I2589 Other forms of chronic ischemic heart disease: Secondary | ICD-10-CM

## 2013-01-24 DIAGNOSIS — K219 Gastro-esophageal reflux disease without esophagitis: Secondary | ICD-10-CM | POA: Insufficient documentation

## 2013-01-24 DIAGNOSIS — Z79899 Other long term (current) drug therapy: Secondary | ICD-10-CM | POA: Insufficient documentation

## 2013-01-24 DIAGNOSIS — I509 Heart failure, unspecified: Secondary | ICD-10-CM

## 2013-01-24 HISTORY — PX: BI-VENTRICULAR IMPLANTABLE CARDIOVERTER DEFIBRILLATOR: SHX5459

## 2013-01-24 HISTORY — DX: Personal history of other medical treatment: Z92.89

## 2013-01-24 HISTORY — DX: Presence of automatic (implantable) cardiac defibrillator: Z95.810

## 2013-01-24 HISTORY — PX: BI-VENTRICULAR IMPLANTABLE CARDIOVERTER DEFIBRILLATOR  (CRT-D): SHX5747

## 2013-01-24 HISTORY — DX: Type 2 diabetes mellitus without complications: E11.9

## 2013-01-24 LAB — GLUCOSE, CAPILLARY: Glucose-Capillary: 126 mg/dL — ABNORMAL HIGH (ref 70–99)

## 2013-01-24 LAB — PROTIME-INR
INR: 1.52 — ABNORMAL HIGH (ref 0.00–1.49)
Prothrombin Time: 17.9 seconds — ABNORMAL HIGH (ref 11.6–15.2)

## 2013-01-24 SURGERY — BI-VENTRICULAR IMPLANTABLE CARDIOVERTER DEFIBRILLATOR  (CRT-D)
Anesthesia: LOCAL

## 2013-01-24 MED ORDER — HEPARIN (PORCINE) IN NACL 2-0.9 UNIT/ML-% IJ SOLN
INTRAMUSCULAR | Status: AC
  Filled 2013-01-24: qty 500

## 2013-01-24 MED ORDER — MIDAZOLAM HCL 5 MG/5ML IJ SOLN
INTRAMUSCULAR | Status: AC
  Filled 2013-01-24: qty 5

## 2013-01-24 MED ORDER — CEFAZOLIN SODIUM-DEXTROSE 2-3 GM-% IV SOLR
2.0000 g | Freq: Four times a day (QID) | INTRAVENOUS | Status: AC
  Administered 2013-01-24 – 2013-01-25 (×3): 2 g via INTRAVENOUS
  Filled 2013-01-24 (×3): qty 50

## 2013-01-24 MED ORDER — FUROSEMIDE 40 MG PO TABS
40.0000 mg | ORAL_TABLET | Freq: Every day | ORAL | Status: DC
  Administered 2013-01-24 – 2013-01-25 (×2): 40 mg via ORAL
  Filled 2013-01-24 (×2): qty 1

## 2013-01-24 MED ORDER — ZOLPIDEM TARTRATE 5 MG PO TABS
5.0000 mg | ORAL_TABLET | Freq: Once | ORAL | Status: AC
  Administered 2013-01-24: 5 mg via ORAL
  Filled 2013-01-24: qty 1

## 2013-01-24 MED ORDER — POLYETHYLENE GLYCOL 3350 17 GM/SCOOP PO POWD: 17.0000 g | Freq: Every day | ORAL | Status: DC | PRN

## 2013-01-24 MED ORDER — SIMVASTATIN 5 MG PO TABS
5.0000 mg | ORAL_TABLET | Freq: Every day | ORAL | Status: DC
  Administered 2013-01-24: 5 mg via ORAL
  Filled 2013-01-24 (×2): qty 1

## 2013-01-24 MED ORDER — ONDANSETRON HCL 4 MG/2ML IJ SOLN
4.0000 mg | Freq: Four times a day (QID) | INTRAMUSCULAR | Status: DC | PRN
  Filled 2013-01-24: qty 2

## 2013-01-24 MED ORDER — WARFARIN - PHYSICIAN DOSING INPATIENT: Freq: Every day | Status: DC

## 2013-01-24 MED ORDER — LIDOCAINE HCL (PF) 1 % IJ SOLN
INTRAMUSCULAR | Status: AC
  Filled 2013-01-24: qty 60

## 2013-01-24 MED ORDER — LIDOCAINE HCL (PF) 1 % IJ SOLN
INTRAMUSCULAR | Status: AC
  Filled 2013-01-24: qty 30

## 2013-01-24 MED ORDER — MUPIROCIN 2 % EX OINT
TOPICAL_OINTMENT | CUTANEOUS | Status: AC
  Filled 2013-01-24: qty 22

## 2013-01-24 MED ORDER — MUPIROCIN 2 % EX OINT
TOPICAL_OINTMENT | Freq: Two times a day (BID) | CUTANEOUS | Status: DC
Start: 2013-01-24 — End: 2013-01-24
  Administered 2013-01-24: 07:00:00 via NASAL
  Filled 2013-01-24: qty 22

## 2013-01-24 MED ORDER — ACETAMINOPHEN 325 MG PO TABS
325.0000 mg | ORAL_TABLET | ORAL | Status: DC | PRN
  Administered 2013-01-24: 650 mg via ORAL
  Filled 2013-01-24: qty 2

## 2013-01-24 MED ORDER — CARVEDILOL 25 MG PO TABS
25.0000 mg | ORAL_TABLET | Freq: Two times a day (BID) | ORAL | Status: DC
  Administered 2013-01-24 – 2013-01-25 (×3): 25 mg via ORAL
  Filled 2013-01-24 (×4): qty 1

## 2013-01-24 MED ORDER — LISINOPRIL 5 MG PO TABS
5.0000 mg | ORAL_TABLET | Freq: Every day | ORAL | Status: DC
Start: 2013-01-24 — End: 2013-01-25
  Administered 2013-01-24 – 2013-01-25 (×2): 5 mg via ORAL
  Filled 2013-01-24 (×2): qty 1

## 2013-01-24 MED ORDER — METOCLOPRAMIDE HCL 5 MG PO TABS
5.0000 mg | ORAL_TABLET | Freq: Four times a day (QID) | ORAL | Status: DC
Start: 2013-01-24 — End: 2013-01-25
  Administered 2013-01-24 – 2013-01-25 (×4): 5 mg via ORAL
  Filled 2013-01-24 (×8): qty 1

## 2013-01-24 MED ORDER — FENTANYL CITRATE 0.05 MG/ML IJ SOLN
INTRAMUSCULAR | Status: AC
  Filled 2013-01-24: qty 2

## 2013-01-24 MED ORDER — WARFARIN SODIUM 2.5 MG PO TABS
2.5000 mg | ORAL_TABLET | ORAL | Status: DC
  Filled 2013-01-24: qty 1

## 2013-01-24 MED ORDER — WARFARIN SODIUM 5 MG PO TABS
5.0000 mg | ORAL_TABLET | ORAL | Status: DC
  Administered 2013-01-24: 5 mg via ORAL
  Filled 2013-01-24 (×2): qty 1

## 2013-01-24 MED ORDER — HYDROCODONE-ACETAMINOPHEN 10-325 MG PO TABS
1.0000 | ORAL_TABLET | Freq: Three times a day (TID) | ORAL | Status: DC | PRN
Start: 2013-01-24 — End: 2013-01-25
  Administered 2013-01-25: 1 via ORAL
  Filled 2013-01-24: qty 1

## 2013-01-24 MED ORDER — DIGOXIN 125 MCG PO TABS
0.1250 mg | ORAL_TABLET | Freq: Every day | ORAL | Status: DC
  Administered 2013-01-24 – 2013-01-25 (×2): 0.125 mg via ORAL
  Filled 2013-01-24 (×2): qty 1

## 2013-01-24 MED ORDER — WARFARIN SODIUM 2.5 MG PO TABS: 2.5000 mg | ORAL_TABLET | Freq: Every day | ORAL | Status: DC

## 2013-01-24 NOTE — H&P (View-Only) (Signed)
HPI [name] is referred today for consideration for BiV ICD insertion. The patient has a h/o long standing LV dysfunction dating back to 2006. She had an ICD extracted in 2007 after developing enterococcus bacteremia and a vegetation on her ICD lead which was thought to be a consequence of hip surgery. Since then she has been stable except for worsening LV dysfunction and CHF. She has also developed PAF and undergone DCCV. She is on maximal medical therapy with Carvedilol, digoxin, lisinopril and lasix. Despite this she is still sob with class 3 heart failure symptoms. She appears to be holding NSR. No Known Allergies   Current Outpatient Prescriptions  Medication Sig Dispense Refill  . carvedilol (COREG) 25 MG tablet Take 25 mg by mouth 2 (two) times daily.        . digoxin (LANOXIN) 0.125 MG tablet Take 1 tablet (0.125 mg total) by mouth daily.  30 tablet  5  . diphenhydramine-acetaminophen (TYLENOL PM EXTRA STRENGTH) 25-500 MG TABS Take 1 tablet by mouth at bedtime as needed (for sleep).       . furosemide (LASIX) 80 MG tablet 1 tab am. 1/2 tab pm      . HYDROcodone-acetaminophen (NORCO) 10-325 MG per tablet Take 1 tablet by mouth 3 (three) times daily as needed for pain (for pain).      . metFORMIN (GLUCOPHAGE) 1000 MG tablet Take 1,000 mg by mouth 2 (two) times daily.        . metoCLOPramide (REGLAN) 5 MG tablet Take 5 mg by mouth 4 (four) times daily.      . polyethylene glycol powder (GLYCOLAX/MIRALAX) powder Take 17 g by mouth daily as needed (constipation).       . pravastatin (PRAVACHOL) 40 MG tablet Take 80 mg by mouth at bedtime.       . warfarin (COUMADIN) 5 MG tablet Take 2.5-5 mg by mouth daily. Patient alternates with 2.5mg daily with 5mg daily      . lisinopril (PRINIVIL,ZESTRIL) 10 MG tablet Take 0.5 tablets (5 mg total) by mouth daily.  30 tablet  6   No current facility-administered medications for this visit.     Past Medical History  Diagnosis Date  .  Cardiomyopathy, nonischemic 10/07    EF 10% in 03/2006, 35% in 10/08 and 10-15% in 10/09 normal coronary angiography in 1999  . 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 Pulmonary embolism 2/07    after total right hip arthroplasty  . Hilar density     infrahilar mass/adenopathy on CT scan 5/07; subsequently  resolved  . Diabetes mellitus, type 2   . LBBB (left bundle branch block)   . GERD (gastroesophageal reflux disease)   . Hyperlipidemia   . Hypertension   . Tobacco abuse     discontinued in 1997, and then resumed  . Urinary incontinence   . Pneumonia     h/o pleural effusion  . Anemia     mild and chronic  . Obstructive sleep apnea     mild-did not tolerate CPAP  . Degenerative joint disease     of knees, shoulder, and hips  . 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 Chronic kidney disease     creatinin-1.44 in 1/09; 1.51 in 1/10    ROS:   All systems reviewed and negative except as noted in the HPI.   Past Surgical History  Procedure Laterality Date  .   Pacemaker removal  11/18/05    Enterococcal infection  . Total hip arthroplasty  2/07    Right  . Abdominal hysterectomy  1990/92    Initial partial hysterectomy followed by BSO  . Knee arthroscopy      Remote  . Colonoscopy w/ polypectomy  2009  . A-v cardiac pacemaker insertion  12/05    Biventricular pacemaker/AICD  . Cardioversion N/A 08/20/2012    Procedure: TEE GUIDED CARDIOVERSION;  Surgeon: Robert M Rothbart, MD;  Location: [facility];  Service: Cardiovascular;  Laterality: N/A;  To be done @ bedside  . Tee without cardioversion N/A 08/20/2012    Procedure: TRANSESOPHAGEAL ECHOCARDIOGRAM (TEE);  Surgeon: Robert M Rothbart, MD;  Location: [facility];  Service: Cardiovascular;  Laterality: N/A;     Family History  Problem Relation Age of Onset  . Hypertension Mother   . Diabetes Mother   . Coronary artery disease Father    . Diabetes Brother   . Hypertension Brother   . Lung cancer Brother   . Arthritis Other   . Diabetes Other   . Heart disease Other     female < 55     History   Social History  . Marital Status: Married    Spouse Name: N/A    Number of Children: N/A  . Years of Education: N/A   Occupational History  . Not on file.   Social History Main Topics  . Smoking status: Former Smoker    Types: Cigarettes    Quit date: 08/04/2007  . Smokeless tobacco: Never Used     Comment: 1 pack per month   . Alcohol Use: No  . Drug Use: No  . Sexually Active: Not Currently    Birth Control/ Protection: None   Other Topics Concern  . Not on file   Social History Narrative   Married, lives with spouse. Retired housekeeper.      BP 138/78  Pulse 69  Wt 169 lb (76.658 kg)  BMI 28.12 kg/m2  Physical Exam:  Stable but chronically ill appearing NAD HEENT: Unremarkable Neck:  7 cm JVD, no thyromegally Back:  No CVA tenderness Lungs:  Clear with no wheezes HEART:  Regular rate rhythm, no murmurs, no rubs, no clicks Abd:  soft, positive bowel sounds, no organomegally, no rebound, no guarding Ext:  2 plus pulses, no edema, no cyanosis, no clubbing Skin:  No rashes no nodules Neuro:  CN II through XII intact, motor grossly intact  EKG - normal sinus rhythm with left bundle branch block   Assess/Plan: 

## 2013-01-24 NOTE — CV Procedure (Signed)
BiV ICD insertion via the right subclavian vein without immediate complication. Y#[id number].

## 2013-01-24 NOTE — H&P (Signed)
  ICD Criteria  Current LVEF (within 6 months):15%  NYHA Functional Classification: Class III  Heart Failure History:  Yes, Duration of heart failure since onset is > 9 months  Non-Ischemic Dilated Cardiomyopathy History:  No.  Atrial Fibrillation/Atrial Flutter:  paroxysmal  Ventricular Tachycardia History:  No.  Cardiac Arrest History:  No  History of Syndromes with Risk of Sudden Death:  No.  Previous ICD:  Yes, extracted 2008, primary prevention implant  Electrophysiology Study: No.  Anticoagulation Therapy:  Patient is on anticoagulation therapy, anticoagulation was NOT held prior to procedure.   Beta Blocker Therapy:  Yes.   Ace Inhibitor/ARB Therapy:  Yes.

## 2013-01-24 NOTE — Interval H&P Note (Signed)
History and Physical Interval Note:  01/24/2013 6:50 AM  [name]  has presented today for surgery, with the diagnosis of Heart failure  The various methods of treatment have been discussed with the patient and family. After consideration of risks, benefits and other options for treatment, the patient has consented to  Procedure(s): BI-VENTRICULAR IMPLANTABLE CARDIOVERTER DEFIBRILLATOR  (CRT-D) (N/A) as a surgical intervention .  The patient's history has been reviewed, patient examined, no change in status, stable for surgery.  I have reviewed the patient's chart and labs.  Questions were answered to the patient's satisfaction.     Leonia Reeves.D.

## 2013-01-25 ENCOUNTER — Ambulatory Visit (HOSPITAL_COMMUNITY): Payer: Medicare Other

## 2013-01-25 LAB — PROTIME-INR
INR: 1.05 (ref 0.00–1.49)
Prothrombin Time: 13.5 seconds (ref 11.6–15.2)

## 2013-01-25 MED ORDER — METFORMIN HCL 1000 MG PO TABS: 1000.0000 mg | ORAL_TABLET | Freq: Two times a day (BID) | ORAL | Status: DC

## 2013-01-25 NOTE — Op Note (Signed)
[name], THAXTON NO.:  [id number]  MEDICAL RECORD NO.:  [id number]  LOCATION:  3W07C                        FACILITY:  [facility]  PHYSICIAN:  Doylene Canning. Ladona Ridgel, MD    DATE OF BIRTH:  [birth year]  DATE OF PROCEDURE:  01/24/2013 DATE OF DISCHARGE:                              OPERATIVE REPORT   PROCEDURE PERFORMED:  Insertion of a biventricular ICD with defibrillation threshold testing.  INTRODUCTION:  The patient is a 67 year old woman with a longstanding ischemic cardiomyopathy, chronic systolic heart failure, left bundle- branch block, QRS duration of 170 milliseconds.  She has class III heart failure and longstanding severe left ventricular dysfunction.  Ejection fraction by echo 15%.  Her severe left ventricular dysfunction dates back almost 10 years.  Her heart failure symptoms have worsened recently.  She is on maximal medical therapy with digoxin, diuretics, carvedilol, and an ACE inhibitor.  She is now referred for insertion of a biventricular ICD secondary to all of the above reasons.  PROCEDURE:  After informed consent was obtained, the patient was taken to the diagnostic EP lab in a fasting state.  After usual preparation and draping, intravenous fentanyl and midazolam was given for sedation. After usual preparation and draping, 30 mL of lidocaine was infiltrated into the right infraclavicular region.  A 7-cm incision was carried out over this region.  Electrocautery was utilized to dissect down to the fascial plane.  The right subclavian vein was punctured x3.  The Medtronic model P4931891 cm active fixation defibrillation lead, serial #[id number] was advanced into the right ventricle and the Medtronic model 507645 cm active fixation pacing lead, serial #[id number] was advanced into the right atrium.  The mapping was first carried out in the right ventricle.  At the final site, the R-waves measured 20 mV. The pacing impedance was 700 ohms and the  threshold was 0.9 V at 0.5 milliseconds.  A 10 V pacing did not stimulate with the diaphragm. Next, attention was turned to the atrium, where the atrial lead was placed in the anterolateral portion of the right atrium.  P-waves were 2 mV, the pacing impedance was 600 ohms, and the threshold was 0.8 V at 0.5 milliseconds.  Again, 10 V pacing did not stimulate with the diaphragm.  With both atrial and ventricular leads in satisfactory position, attention was then turned to placement of the left ventricular lead.  The coronary sinus guiding catheter was advanced into the right atrium and a 6-French hexapolar EP catheter was utilized to cannulate the coronary sinus.  Having accomplished this venography, the coronary sinus was carried out.  This demonstrated a posterior, as well as the high lateral vein.  The posterior vein was ultimately selected for lead placement after the lateral vein was found to have an elevated pacing threshold.  The Medtronic model [id number] cm bipolar pacing lead, serial #[id number] was advanced into the posterior vein of the left ventricle. The pacing threshold was around a V at 0.5 milliseconds.  The pacing impedance 1100 ohms and 10 V pacing did not stimulate with the diaphragm.  With these satisfactory parameters, the pacing lead was liberated from the guiding catheter in the usual manner and  the leads were all secured to the subpectoralis fascia with a figure-of-eight silk suture.  The sewing sleeve was secured with silk suture.  Electrocautery was utilized to make a subcutaneous pocket and  electrocautery was utilized to assure hemostasis.  Antibiotic irrigation was utilized to irrigate the pocket.  The Medtronic Viva XT CRTD biventricular ICD serial [id number] was connected to the atrial RV and LV leads and placed back in the subcutaneous pocket.  The pocket was irrigated with antibiotic irrigation and the incision was closed with 2-0 and 3-0 Vicryl.  Benzoin and  Steri-Strips were painted on the skin, a pressure dressing was applied.  At this point, I scrubbed out of the case and supervised defibrillation threshold testing.  After the patient was more deeply sedated under my direct supervision, with additional intravenous fentanyl and Versed, ventricular fibrillation was induced with a T-wave shock.  A 20-joule shock was delivered, which terminated a ventricular fibrillation and restored sinus rhythm.  No additional defibrillation threshold testing was carried out and the patient was returned to her room in satisfactory condition.  COMPLICATIONS:  There no immediate procedure complications.  RESULTS:  This demonstrate successful implantation of a Medtronic biventricular ICD in a patient with a longstanding ischemic cardiomyopathy, chronic systolic heart failure, left bundle-branch block, ejection fraction 15% despite maximal medical therapy.     Doylene Canning. Ladona Ridgel, MD     GWT/MEDQ  D:  01/24/2013  T:  01/25/2013  Job:  [id number]

## 2013-01-25 NOTE — Discharge Summary (Cosign Needed)
ELECTROPHYSIOLOGY PROCEDURE DISCHARGE SUMMARY    Patient ID: [name],  MRN: [id number], DOB/AGE: [birth year] 67 y.o.  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  Diabetes 2.  LBBB 3.  GERD 4.  Hyperlipidemia 5.  Hypertension 6.  Prior enterococcal infection with removal of previously implanted ICD 7.  Congestive heart failure  Procedures This Admission: 1.  Implantation of a cardiac resynchronization therapy defibrillator by Dr Ladona Ridgel on 01-24-2013.  The patient received a Medtronic Viva XT defibrillator with model number 5076 right atrial lead, 6935 right ventricular lead, and 4296 left ventricular lead.  DFT's were successful at 20J.  There were no early apparent complications. 2.  CXR on 01-25-2013 demonstrated no pneumothorax status post device implant.   Brief HPI: [name] was referred to EP for consideration for BiV ICD insertion. The patient has a h/o long standing LV dysfunction dating back to 2006. She had an ICD extracted in 2007 after developing enterococcus bacteremia and a vegetation on her ICD lead which was thought to be a consequence of hip surgery. Since then she has been stable except for worsening LV dysfunction and CHF. She has also developed PAF and undergone DCCV. She is on maximal medical therapy with Carvedilol, digoxin, lisinopril and lasix. Despite this she is still sob with class 3 heart failure symptoms. She appears to be holding NSR.  Risks, benefits, and alternatives to CRTD implantation were reviewed with the patient who wished to proceed.   Hospital Course:  The patient was admitted and underwent implantation of a Medtronic CRTD with details as outlined above.   She was monitored on telemetry overnight which  demonstrated P synchronous pacing.  Right chest was without hematoma or ecchymosis.  The device was interrogated and found to be functioning normally.  CXR was obtained and demonstrated no pneumothorax status post device implantation.  Wound care, arm mobility, and restrictions were reviewed with the patient.  Dr Ladona Ridgel examined the patient and considered them stable for discharge to home.    Discharge Vitals: Blood pressure 120/82, pulse 88, temperature 97.7 F (36.5 C), temperature source Oral, resp. rate 18, height 5\' 5"  (1.651 m), weight 161 lb 9.6 oz (73.3 kg), SpO2 97.00%.    Labs:   Lab Results  Component Value Date   WBC 7.0 01/20/2013   HGB 11.2* 01/20/2013   HCT 33.9* 01/20/2013   MCV 91.9 01/20/2013   PLT 245 01/20/2013    Recent Labs Lab 01/20/13 1010  NA 140  K 4.1  CL 100  CO2 28  BUN 30*  CREATININE 1.17*  CALCIUM 10.1  GLUCOSE 181*    Discharge Medications:    Medication List         carvedilol 25 MG tablet  Commonly known as:  COREG  Take 25 mg by mouth 2 (two) times daily.     digoxin 0.125 MG tablet  Commonly known as:  LANOXIN  Take 1 tablet (0.125 mg total) by mouth daily.     furosemide 80 MG tablet  Commonly known as:  LASIX  1 tab am. 1/2 tab pm     HYDROcodone-acetaminophen 10-325 MG per tablet  Commonly known as:  NORCO  Take 1 tablet by mouth 3 (three) times daily as needed for pain (for pain).     lisinopril 10 MG  tablet  Commonly known as:  PRINIVIL,ZESTRIL  Take 0.5 tablets (5 mg total) by mouth daily.     metFORMIN 1000 MG tablet  Commonly known as:  GLUCOPHAGE  Take 1 tablet (1,000 mg total) by mouth 2 (two) times daily. HOLD for today, then resume usual dose tomorrow, 01/26/2013.     metoCLOPramide 5 MG tablet  Commonly known as:  REGLAN  Take 5 mg by mouth 4 (four) times daily.     polyethylene glycol powder powder  Commonly known as:  GLYCOLAX/MIRALAX  Take 17 g by mouth daily as needed (constipation).     pravastatin 40  MG tablet  Commonly known as:  PRAVACHOL  Take 80 mg by mouth at bedtime.     TYLENOL PM EXTRA STRENGTH 25-500 MG Tabs  Generic drug:  diphenhydramine-acetaminophen  Take 1 tablet by mouth at bedtime as needed (for sleep).     warfarin 5 MG tablet  Commonly known as:  COUMADIN  Take 2.5-5 mg by mouth daily. Takes 1 tablet then, 1/2 tablet the next 2 days and then repeats cycle        Disposition:      Discharge Orders   Future Appointments Provider Department Dept Phone   02/03/2013 3:00 PM [facility] [place] [employer] Main [facility] [place]) [phone]   Future Orders Complete By Expires     Diet - low sodium heart healthy  As directed     Discharge instructions  As directed     Comments:      Please see post device implant discharge instructions    Increase activity slowly  As directed       Follow-up Information   Follow up with [facility] On 02/03/2013. (At 3:00 PM for wound check)    Contact information:   Architectural technologist - [facility] [address] [place] [address] [phone]      Follow up with Lewayne Bunting, MD In 3 months. (For device follow-up)    Contact information:   [employer] [facility] [address] [place] [address] [phone]      Follow up with Milana Obey, MD. Schedule an appointment as soon as possible for a visit on 01/27/2013. (For INR check (Coumadin follow-up))    Contact information:   [address] [place] [address] [phone]       Duration of Discharge Encounter: Greater than 30 minutes including physician time.  Signed, Gypsy Balsam, RN, BSN 01/25/2013, 10:07 AM

## 2013-01-25 NOTE — Progress Notes (Signed)
D/c orders received;IV removed with gauze on, pt remains in stable condition, pt meds and instructions received and given to pt; pt d/c to home, reviewed post ICD instructions with pt and pt family

## 2013-02-03 ENCOUNTER — Ambulatory Visit (INDEPENDENT_AMBULATORY_CARE_PROVIDER_SITE_OTHER): Payer: Medicare Other | Admitting: *Deleted

## 2013-02-03 DIAGNOSIS — I509 Heart failure, unspecified: Secondary | ICD-10-CM

## 2013-02-03 DIAGNOSIS — I5022 Chronic systolic (congestive) heart failure: Secondary | ICD-10-CM

## 2013-02-03 DIAGNOSIS — I428 Other cardiomyopathies: Secondary | ICD-10-CM

## 2013-02-03 LAB — ICD DEVICE OBSERVATION
AL AMPLITUDE: 2.4 mv
AL IMPEDENCE ICD: 342 Ohm
AL THRESHOLD: 0.75 V
LV LEAD IMPEDENCE ICD: 608 Ohm
LV LEAD THRESHOLD: 0.75 V
RV LEAD AMPLITUDE: 20 mv
RV LEAD IMPEDENCE ICD: 551 Ohm
RV LEAD THRESHOLD: 0.75 V

## 2013-02-03 NOTE — Progress Notes (Signed)
Wound check in office ICD.

## 2013-02-22 ENCOUNTER — Encounter: Payer: Self-pay | Admitting: Internal Medicine

## 2013-04-28 ENCOUNTER — Encounter: Payer: Self-pay | Admitting: *Deleted

## 2013-04-28 ENCOUNTER — Encounter: Payer: Medicare Other | Admitting: Internal Medicine

## 2013-05-17 ENCOUNTER — Telehealth (INDEPENDENT_AMBULATORY_CARE_PROVIDER_SITE_OTHER): Payer: Self-pay | Admitting: *Deleted

## 2013-05-17 ENCOUNTER — Other Ambulatory Visit (INDEPENDENT_AMBULATORY_CARE_PROVIDER_SITE_OTHER): Payer: Self-pay | Admitting: *Deleted

## 2013-05-17 DIAGNOSIS — Z86010 Personal history of colonic polyps: Secondary | ICD-10-CM

## 2013-05-17 DIAGNOSIS — Z1211 Encounter for screening for malignant neoplasm of colon: Secondary | ICD-10-CM

## 2013-05-17 DIAGNOSIS — R197 Diarrhea, unspecified: Secondary | ICD-10-CM

## 2013-05-17 DIAGNOSIS — R112 Nausea with vomiting, unspecified: Secondary | ICD-10-CM

## 2013-05-17 MED ORDER — PEG 3350-KCL-NA BICARB-NACL 420 G PO SOLR: 4000.0000 mL | Freq: Once | ORAL | Status: DC

## 2013-05-17 NOTE — Telephone Encounter (Signed)
agree

## 2013-05-17 NOTE — Telephone Encounter (Signed)
Patient needs trilyte 

## 2013-05-17 NOTE — Telephone Encounter (Signed)
  Procedure: tcs/egd  Reason/Indication:  N/V, diarrhea, hx polyps  Has patient had this procedure before?  Yes, TCS 2008 & EGD 2007 [facility])  If so, when, by whom and where?    Is there a family history of colon cancer?  no  Who?  What age when diagnosed?    Is patient diabetic?   yes      Does patient have prosthetic heart valve?  no  Do you have a pacemaker?  yes and defibraillator  Has patient ever had endocarditis? no  Has patient had joint replacement within last 12 months?  no  Does patient tend to be constipated or take laxatives? yes  Is patient on Coumadin, Plavix and/or Aspirin? yes  Medications: coumadin 5 mg daily, metformin 1000 mg bid, carvedilol 25 mg bid, digoxin 125 mg daily, furosemide 80 mg 1 tab in am and 1/2 tab in pm, hydrocodone/aceta 10/325 mg tid, lisinopril 10 mg daily, reglan 5 mg bid, miralax prn, pravastatin 40 mg bid  Allergies: nkda  Medication Adjustment: coumadin 5 days -- Dr [name] will bridge, patient is aware, hold evening dose of metformin 12/18 and morning of  Procedure date & time: 06/10/13 at 1010

## 2013-06-07 ENCOUNTER — Encounter (HOSPITAL_COMMUNITY): Payer: Self-pay | Admitting: Pharmacy Technician

## 2013-06-10 ENCOUNTER — Encounter (HOSPITAL_COMMUNITY): Payer: Self-pay | Admitting: *Deleted

## 2013-06-10 ENCOUNTER — Ambulatory Visit (HOSPITAL_COMMUNITY)
Admission: RE | Admit: 2013-06-10 | Discharge: 2013-06-10 | Disposition: A | Discharge status: Home / Self Care | Payer: Medicare Other | Source: Ambulatory Visit | Attending: Internal Medicine | Admitting: Internal Medicine

## 2013-06-10 ENCOUNTER — Other Ambulatory Visit (INDEPENDENT_AMBULATORY_CARE_PROVIDER_SITE_OTHER): Payer: Self-pay | Admitting: Internal Medicine

## 2013-06-10 ENCOUNTER — Encounter (HOSPITAL_COMMUNITY)
Admission: RE | Disposition: A | Discharge status: Home / Self Care | Payer: Self-pay | Source: Ambulatory Visit | Attending: Internal Medicine

## 2013-06-10 DIAGNOSIS — R630 Anorexia: Secondary | ICD-10-CM | POA: Insufficient documentation

## 2013-06-10 DIAGNOSIS — R112 Nausea with vomiting, unspecified: Secondary | ICD-10-CM

## 2013-06-10 DIAGNOSIS — Z860100 Personal history of colon polyps, unspecified: Secondary | ICD-10-CM | POA: Insufficient documentation

## 2013-06-10 DIAGNOSIS — R197 Diarrhea, unspecified: Secondary | ICD-10-CM

## 2013-06-10 DIAGNOSIS — R1310 Dysphagia, unspecified: Secondary | ICD-10-CM

## 2013-06-10 DIAGNOSIS — K644 Residual hemorrhoidal skin tags: Secondary | ICD-10-CM | POA: Insufficient documentation

## 2013-06-10 DIAGNOSIS — Z7901 Long term (current) use of anticoagulants: Secondary | ICD-10-CM | POA: Insufficient documentation

## 2013-06-10 DIAGNOSIS — R634 Abnormal weight loss: Secondary | ICD-10-CM | POA: Insufficient documentation

## 2013-06-10 DIAGNOSIS — G8929 Other chronic pain: Secondary | ICD-10-CM

## 2013-06-10 DIAGNOSIS — D126 Benign neoplasm of colon, unspecified: Secondary | ICD-10-CM | POA: Insufficient documentation

## 2013-06-10 DIAGNOSIS — K5730 Diverticulosis of large intestine without perforation or abscess without bleeding: Secondary | ICD-10-CM | POA: Insufficient documentation

## 2013-06-10 DIAGNOSIS — R1013 Epigastric pain: Secondary | ICD-10-CM | POA: Insufficient documentation

## 2013-06-10 DIAGNOSIS — L909 Atrophic disorder of skin, unspecified: Secondary | ICD-10-CM | POA: Insufficient documentation

## 2013-06-10 HISTORY — PX: COLONOSCOPY WITH ESOPHAGOGASTRODUODENOSCOPY (EGD): SHX5779

## 2013-06-10 LAB — HEPATIC FUNCTION PANEL
Alkaline Phosphatase: 60 U/L (ref 39–117)
Indirect Bilirubin: 0.9 mg/dL (ref 0.3–0.9)
Total Bilirubin: 1.1 mg/dL (ref 0.3–1.2)

## 2013-06-10 SURGERY — COLONOSCOPY WITH ESOPHAGOGASTRODUODENOSCOPY (EGD)
Anesthesia: Moderate Sedation

## 2013-06-10 MED ORDER — MEPERIDINE HCL 50 MG/ML IJ SOLN
INTRAMUSCULAR | Status: DC | PRN
  Administered 2013-06-10: 10 mg via INTRAVENOUS
  Administered 2013-06-10 (×2): 20 mg via INTRAVENOUS

## 2013-06-10 MED ORDER — MIDAZOLAM HCL 5 MG/5ML IJ SOLN
INTRAMUSCULAR | Status: AC
  Filled 2013-06-10: qty 10

## 2013-06-10 MED ORDER — MEPERIDINE HCL 50 MG/ML IJ SOLN
INTRAMUSCULAR | Status: AC
  Filled 2013-06-10: qty 1

## 2013-06-10 MED ORDER — STERILE WATER FOR IRRIGATION IR SOLN
Status: DC | PRN
  Administered 2013-06-10: 10:00:00

## 2013-06-10 MED ORDER — SODIUM CHLORIDE 0.9 % IV SOLN
INTRAVENOUS | Status: DC
  Administered 2013-06-10: 09:00:00 via INTRAVENOUS

## 2013-06-10 MED ORDER — MIDAZOLAM HCL 5 MG/5ML IJ SOLN
INTRAMUSCULAR | Status: DC | PRN
  Administered 2013-06-10 (×2): 1 mg via INTRAVENOUS
  Administered 2013-06-10: 2 mg via INTRAVENOUS
  Administered 2013-06-10: 1 mg via INTRAVENOUS
  Administered 2013-06-10 (×2): 2 mg via INTRAVENOUS

## 2013-06-10 MED ORDER — BUTAMBEN-TETRACAINE-BENZOCAINE 2-2-14 % EX AERO
INHALATION_SPRAY | CUTANEOUS | Status: DC | PRN
  Administered 2013-06-10: 2 via TOPICAL

## 2013-06-10 NOTE — H&P (Addendum)
[name] is an 67 y.o. female.   Chief Complaint: Patient is here for EGD, possible ED and colonoscopy. HPI: Patient is 67 year old Caucasian female who presents with 2-3 month history of postprandial nausea vomiting and epigastric pain. She also complains of frequent heartburn and dysphagia. She also complains of intermittent stranding and fluid. She denies hematemesis or melena. She does complain of intermittent hematochezia when she passes small amount of fresh blood per rectum. She does not have good appetite. She has lost 20 pounds over the last several months. She has history of colonic adenomas. She had a tubulovillous adenoma with high-grade dysplasia removed from her cecum on her last colonoscopy of February 2008. She has history of nonischemic cardiomyopathy. Patient has been off warfarin and being bridged with Lovenox. Last dose was yesterday. Family history is negative for CRC.  Past Medical History  Diagnosis Date  . Cardiomyopathy, nonischemic 10/07    EF 10% in 03/2006, 35% in 10/08 and 10-15% in 10/09 normal coronary angiography in 1999  . 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 Pulmonary embolism 07/2005    after total right hip arthroplasty  . Hilar density     infrahilar mass/adenopathy on CT scan 5/07; subsequently  resolved  . LBBB (left bundle branch block)   . GERD (gastroesophageal reflux disease)   . Hyperlipidemia   . Hypertension   . Tobacco abuse     discontinued in 1997, and then resumed  . Urinary incontinence   . Anemia     mild and chronic  . 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 Chronic kidney disease     creatinin-1.44 in 1/09; 1.51 in 1/10  . Automatic implantable cardioverter-defibrillator in situ   . CHF (congestive heart failure)   . Pneumonia 07/2005  . Obstructive sleep apnea     mild-did not tolerate CPAP (01/24/2013)  . Type II diabetes mellitus    . History of blood transfusion     "related to when they took my womb out" (01/24/2013)  . Degenerative joint disease     of knees, shoulder, and hips    Past Surgical History  Procedure Laterality Date  . Pacemaker removal  11/18/05    Enterococcal infection  . Total hip arthroplasty Right 07/2005  . Knee arthroscopy Right 1980's?  . Colonoscopy w/ polypectomy  2009  . Cardioversion N/A 08/20/2012    Procedure: TEE GUIDED CARDIOVERSION;  Surgeon: Kathlen Brunswick, MD;  Location: [facility];  Service: Cardiovascular;  Laterality: N/A;  To be done @ bedside  . Tee without cardioversion N/A 08/20/2012    Procedure: TRANSESOPHAGEAL ECHOCARDIOGRAM (TEE);  Surgeon: Kathlen Brunswick, MD;  Location: [facility];  Service: Cardiovascular;  Laterality: N/A;  . Bi-ventricular implantable cardioverter defibrillator  (crt-d)  01/24/2013  . A-v cardiac pacemaker insertion  12/05    Biventricular pacemaker/AICD  . Abdominal hysterectomy  1990/92    Initial partial hysterectomy followed by BSO  . Tubal ligation  1980's  . Cardiac catheterization      Family History  Problem Relation Age of Onset  . Hypertension Mother   . Diabetes Mother   . Coronary artery disease Father   . Diabetes Brother   . Hypertension Brother   . Lung cancer Brother   . Arthritis Other   . Diabetes Other   . Heart disease Other     female < 59   Social  History:  reports that she quit smoking about 5 years ago. Her smoking use included Cigarettes. She smoked 0.00 packs per day for 12 years. She has never used smokeless tobacco. She reports that she does not drink alcohol or use illicit drugs.  Allergies: No Known Allergies  Medications Prior to Admission  Medication Sig Dispense Refill  . carvedilol (COREG) 25 MG tablet Take 25 mg by mouth 2 (two) times daily.        . digoxin (LANOXIN) 0.125 MG tablet Take 1 tablet (0.125 mg total) by mouth daily.  30 tablet  5  . furosemide (LASIX) 80 MG tablet Take 40-80 mg by mouth 2  (two) times daily. 1 tab am. 1/2 tab pm      . HYDROcodone-acetaminophen (NORCO) 10-325 MG per tablet Take 1 tablet by mouth 3 (three) times daily as needed for pain (for pain).      Marland Kitchen lisinopril (PRINIVIL,ZESTRIL) 10 MG tablet Take 0.5 tablets (5 mg total) by mouth daily.  30 tablet  6  . metFORMIN (GLUCOPHAGE) 1000 MG tablet Take 1 tablet (1,000 mg total) by mouth 2 (two) times daily. HOLD for today, then resume usual dose tomorrow, 01/26/2013.      . metoCLOPramide (REGLAN) 5 MG tablet Take 5 mg by mouth 4 (four) times daily.      . polyethylene glycol powder (GLYCOLAX/MIRALAX) powder Take 17 g by mouth daily as needed (constipation).       . polyethylene glycol-electrolytes (NULYTELY/GOLYTELY) 420 G solution Take 4,000 mLs by mouth once.  4000 mL  0  . pravastatin (PRAVACHOL) 40 MG tablet Take 80 mg by mouth at bedtime.       . diphenhydramine-acetaminophen (TYLENOL PM EXTRA STRENGTH) 25-500 MG TABS Take 1 tablet by mouth at bedtime as needed (for sleep).       . warfarin (COUMADIN) 5 MG tablet Take 2.5-5 mg by mouth daily. Takes 1 tablet then, 1/2 tablet the next 2 days and then repeats cycle        Results for orders placed during the hospital encounter of 06/10/13 (from the past 48 hour(s))  GLUCOSE, CAPILLARY     Status: Abnormal   Collection Time    06/10/13  8:53 AM      Result Value Range   Glucose-Capillary 217 (*) 70 - 99 mg/dL   Comment 1 Documented in Chart     No results found.  ROS  Blood pressure 146/83, pulse 107, temperature 98.3 F (36.8 C), temperature source Oral, resp. rate 22, height 5\' 5"  (1.651 m), weight 161 lb (73.029 kg), SpO2 97.00%. Physical Exam  Constitutional: She appears well-developed and well-nourished.  HENT:  Mouth/Throat: Oropharynx is clear and moist.  Eyes: Conjunctivae are normal. No scleral icterus.  Neck: No thyromegaly present.  Cardiovascular: Normal rate, regular rhythm and normal heart sounds.   Murmur: faint SEM at  LLSB. Respiratory: Effort normal and breath sounds normal.  GI: Soft. She exhibits no mass. Tenderness: mild midepigastric tenderness. There is no guarding.  Musculoskeletal: Edema: trace edema around the ankles.  Lymphadenopathy:    She has no cervical adenopathy.  Neurological: She is alert.  Skin: Skin is warm and dry.     Assessment/Plan Recurrent nausea vomiting epigastric pain and weight loss. Dysphagia. History of colonic adenomas. EGD, possible ED and colonoscopy.  REHMAN,NAJEEB U 06/10/2013, 9:31 AM

## 2013-06-10 NOTE — Op Note (Signed)
EGD PROCEDURE REPORT  PATIENT:  [name]  MR#:  [id number] Birthdate:  1945/11/22, 67 y.o., female Endoscopist:  Dr. Malissa Hippo, MD Referred By:  Dr. Mila Homer. [name], MD  Procedure Date: 06/10/2013  Procedure:   EGD, ED & Colonoscopy  Indications:  Patient is 67 year old Caucasian female with multiple medical problems including known ischemic cardiomyopathy who presents with epigastric pain recurrent nausea vomiting anorexia and weight loss. Also complains of intermittent solid food dysphagia She also has history of high grade colonic adenomas. Last colonoscopy was in February 2008 with removal of cecal polyp which was tubulovillous adenoma with high-grade dysplasia. Patient's warfarin is on hold and is being bridged with enoxaparin.           Informed Consent:  The risks, benefits, alternatives & imponderables which include, but are not limited to, bleeding, infection, perforation, drug reaction and potential missed lesion have been reviewed.  The potential for biopsy, lesion removal, esophageal dilation, etc. have also been discussed.  Questions have been answered.  All parties agreeable.  Please see history & physical in medical record for more information.  Medications:  Demerol 50 mg IV Versed 9 mg IV Cetacaine spray topically for oropharyngeal anesthesia  EGD  Description of procedure:  The endoscope was introduced through the mouth and advanced to the second portion of the duodenum without difficulty or limitations. The mucosal surfaces were surveyed very carefully during advancement of the scope and upon withdrawal.  Findings:  Esophagus:  Mucosa of the esophagus was normal. GE junction was unremarkable. GEJ:  42 cm Stomach:  Stomach was empty and distended very well with insufflation. Folds in the proximal stomach were normal. Examination of mucosa gastric body, antrum, pyloric channel, fundus and cardia was normal. Duodenum:  Normal bulbar and post bulbar  mucosa.  Therapeutic/Diagnostic Maneuvers Performed:   Esophagus was dilated by passing 56 [place] Maloney dilator to full insertion without any difficulty. Endoscope was passed again and no mucosal disruption noted.  COLONOSCOPY Description of procedure:  After a digital rectal exam was performed, that colonoscope was advanced from the anus through the rectum and colon to the area of the cecum, ileocecal valve and appendiceal orifice. The cecum was deeply intubated. These structures were well-seen and photographed for the record. From the level of the cecum and ileocecal valve, the scope was slowly and cautiously withdrawn. The mucosal surfaces were carefully surveyed utilizing scope tip to flexion to facilitate fold flattening as needed. The scope was pulled down into the rectum where a thorough exam including retroflexion was performed.  Findings:   Prep fair to satisfactory. She had a lot of liquid stool throughout the colon. 8 mm polyp snared from cecum and retrieved using Roth net. Magnet was placed over ICD device per protocol during snare polypectomy. Threel polyps were cold snared and submitted together. One was at cecum and two at  hepatic flexure. Scattered diverticula and sigmoid and descending colon. Hemorrhoids below the dentate line and sentinel skin tags.  Therapeutic/Diagnostic Maneuvers Performed:  See above  Complications:  None  Cecal Withdrawal Time:   36 minutes   Impression:  EGD findings; Normal esophagogastroduodenoscopy. Esophagus dilated by passing 56 French Maloney dilator given history of dysphagia but no mucosal disruption induced.  Colonoscopy findings; 8 mm cecal polyp was hot snared. Threel polyps cold snared and submitted together. One was located at cecum and others 2 at hepatic flexure. Left-sided colonic diverticulosis. External hemorrhoids and anal skin tags.  Recommendations:  Patient to resume  warfarin this evening starting at usual  dose. Lovenox starting on 06/11/2013 and continue for 3 doses.  Get INR checked in 10 days. Will check LFTs today and schedule patient for upper abdominal ultrasound to further evaluate her epigastric pain with nausea and vomiting. I will contact patient with results of biopsy and blood work    Lionel December U  06/10/2013 10:47 AM  CC: Dr. Milana Obey, MD & Dr. Bonnetta Barry ref. provider found

## 2013-06-13 ENCOUNTER — Encounter (INDEPENDENT_AMBULATORY_CARE_PROVIDER_SITE_OTHER): Payer: Self-pay

## 2013-06-14 ENCOUNTER — Encounter (HOSPITAL_COMMUNITY): Payer: Self-pay | Admitting: Internal Medicine

## 2013-06-14 ENCOUNTER — Ambulatory Visit (HOSPITAL_COMMUNITY)
Admit: 2013-06-14 | Discharge: 2013-06-14 | Disposition: A | Discharge status: Home / Self Care | Payer: Medicare Other | Attending: Internal Medicine | Admitting: Internal Medicine

## 2013-06-14 DIAGNOSIS — G8929 Other chronic pain: Secondary | ICD-10-CM

## 2013-06-14 DIAGNOSIS — R1013 Epigastric pain: Secondary | ICD-10-CM | POA: Insufficient documentation

## 2013-06-14 DIAGNOSIS — R112 Nausea with vomiting, unspecified: Secondary | ICD-10-CM

## 2013-06-18 ENCOUNTER — Emergency Department (HOSPITAL_COMMUNITY)
Admission: EM | Admit: 2013-06-18 | Discharge: 2013-06-19 | Disposition: A | Discharge status: Home / Self Care | Payer: Medicare Other | Attending: Emergency Medicine | Admitting: Emergency Medicine

## 2013-06-18 ENCOUNTER — Emergency Department (HOSPITAL_COMMUNITY): Payer: Medicare Other

## 2013-06-18 ENCOUNTER — Encounter (HOSPITAL_COMMUNITY): Payer: Self-pay | Admitting: Emergency Medicine

## 2013-06-18 DIAGNOSIS — I129 Hypertensive chronic kidney disease with stage 1 through stage 4 chronic kidney disease, or unspecified chronic kidney disease: Secondary | ICD-10-CM | POA: Insufficient documentation

## 2013-06-18 DIAGNOSIS — G4733 Obstructive sleep apnea (adult) (pediatric): Secondary | ICD-10-CM | POA: Insufficient documentation

## 2013-06-18 DIAGNOSIS — Z96649 Presence of unspecified artificial hip joint: Secondary | ICD-10-CM | POA: Insufficient documentation

## 2013-06-18 DIAGNOSIS — M1710 Unilateral primary osteoarthritis, unspecified knee: Secondary | ICD-10-CM | POA: Insufficient documentation

## 2013-06-18 DIAGNOSIS — Z8619 Personal history of other infectious and parasitic diseases: Secondary | ICD-10-CM | POA: Insufficient documentation

## 2013-06-18 DIAGNOSIS — M169 Osteoarthritis of hip, unspecified: Secondary | ICD-10-CM | POA: Insufficient documentation

## 2013-06-18 DIAGNOSIS — M1610 Unilateral primary osteoarthritis, unspecified hip: Secondary | ICD-10-CM | POA: Insufficient documentation

## 2013-06-18 DIAGNOSIS — N189 Chronic kidney disease, unspecified: Secondary | ICD-10-CM | POA: Insufficient documentation

## 2013-06-18 DIAGNOSIS — Z79899 Other long term (current) drug therapy: Secondary | ICD-10-CM | POA: Insufficient documentation

## 2013-06-18 DIAGNOSIS — R42 Dizziness and giddiness: Secondary | ICD-10-CM | POA: Insufficient documentation

## 2013-06-18 DIAGNOSIS — Z860100 Personal history of colon polyps, unspecified: Secondary | ICD-10-CM | POA: Insufficient documentation

## 2013-06-18 DIAGNOSIS — IMO0002 Reserved for concepts with insufficient information to code with codable children: Secondary | ICD-10-CM | POA: Insufficient documentation

## 2013-06-18 DIAGNOSIS — Z862 Personal history of diseases of the blood and blood-forming organs and certain disorders involving the immune mechanism: Secondary | ICD-10-CM | POA: Insufficient documentation

## 2013-06-18 DIAGNOSIS — Z95818 Presence of other cardiac implants and grafts: Secondary | ICD-10-CM | POA: Insufficient documentation

## 2013-06-18 DIAGNOSIS — M19019 Primary osteoarthritis, unspecified shoulder: Secondary | ICD-10-CM | POA: Insufficient documentation

## 2013-06-18 DIAGNOSIS — Z95810 Presence of automatic (implantable) cardiac defibrillator: Secondary | ICD-10-CM | POA: Insufficient documentation

## 2013-06-18 DIAGNOSIS — Z7901 Long term (current) use of anticoagulants: Secondary | ICD-10-CM | POA: Insufficient documentation

## 2013-06-18 DIAGNOSIS — Z8701 Personal history of pneumonia (recurrent): Secondary | ICD-10-CM | POA: Insufficient documentation

## 2013-06-18 DIAGNOSIS — Z87891 Personal history of nicotine dependence: Secondary | ICD-10-CM | POA: Insufficient documentation

## 2013-06-18 DIAGNOSIS — K219 Gastro-esophageal reflux disease without esophagitis: Secondary | ICD-10-CM | POA: Insufficient documentation

## 2013-06-18 DIAGNOSIS — Z86711 Personal history of pulmonary embolism: Secondary | ICD-10-CM | POA: Insufficient documentation

## 2013-06-18 DIAGNOSIS — Z950 Presence of cardiac pacemaker: Secondary | ICD-10-CM | POA: Insufficient documentation

## 2013-06-18 DIAGNOSIS — I509 Heart failure, unspecified: Secondary | ICD-10-CM | POA: Insufficient documentation

## 2013-06-18 LAB — CBC WITH DIFFERENTIAL/PLATELET
Basophils Absolute: 0 10*3/uL (ref 0.0–0.1)
Eosinophils Relative: 1 % (ref 0–5)
HCT: 30.2 % — ABNORMAL LOW (ref 36.0–46.0)
Hemoglobin: 9.7 g/dL — ABNORMAL LOW (ref 12.0–15.0)
Lymphocytes Relative: 19 % (ref 12–46)
Lymphs Abs: 1.2 10*3/uL (ref 0.7–4.0)
Neutro Abs: 4.8 10*3/uL (ref 1.7–7.7)
Neutrophils Relative %: 73 % (ref 43–77)
Platelets: 229 10*3/uL (ref 150–400)
RDW: 13.8 % (ref 11.5–15.5)
WBC: 6.6 10*3/uL (ref 4.0–10.5)

## 2013-06-18 LAB — DIGOXIN LEVEL: Digoxin Level: <0.3 ng/mL — ABNORMAL LOW (ref 0.8–2.0)

## 2013-06-18 LAB — TROPONIN I: Troponin I: <0.3 ng/mL (ref <0.30)

## 2013-06-18 LAB — COMPREHENSIVE METABOLIC PANEL
AST: 12 U/L (ref 0–37)
Albumin: 3.6 g/dL (ref 3.5–5.2)
Alkaline Phosphatase: 63 U/L (ref 39–117)
BUN: 32 mg/dL — ABNORMAL HIGH (ref 6–23)
Calcium: 9.4 mg/dL (ref 8.4–10.5)
Chloride: 97 mEq/L (ref 96–112)
Creatinine, Ser: 1.48 mg/dL — ABNORMAL HIGH (ref 0.50–1.10)
Total Bilirubin: 0.8 mg/dL (ref 0.3–1.2)
Total Protein: 7 g/dL (ref 6.0–8.3)

## 2013-06-18 LAB — PROTIME-INR
INR: 1.78 — ABNORMAL HIGH (ref 0.00–1.49)
Prothrombin Time: 20.2 seconds — ABNORMAL HIGH (ref 11.6–15.2)

## 2013-06-18 LAB — PRO B NATRIURETIC PEPTIDE: Pro B Natriuretic peptide (BNP): 18063 pg/mL — ABNORMAL HIGH (ref 0–125)

## 2013-06-18 MED ORDER — FUROSEMIDE 10 MG/ML IJ SOLN
80.0000 mg | Freq: Once | INTRAMUSCULAR | Status: AC
  Administered 2013-06-18: 80 mg via INTRAVENOUS
  Filled 2013-06-18: qty 8

## 2013-06-18 NOTE — ED Notes (Signed)
Short of breath, dizzy, legs and feet are swelling, nauseated per pt.

## 2013-06-18 NOTE — ED Provider Notes (Signed)
CSN: [id number]     Arrival date & time 06/18/13  1846 History   First MD Initiated Contact with Patient 06/18/13 1909     Chief Complaint  Patient presents with  . Shortness of Breath  . Leg Swelling  . Nausea  . Dizziness   (Consider location/radiation/quality/duration/timing/severity/associated sxs/prior Treatment) Patient is a 67 y.o. female presenting with shortness of breath and dizziness. The history is provided by the patient (the pt complains of sob).  Shortness of Breath Severity:  Moderate Onset quality:  Sudden Timing:  Constant Progression:  Waxing and waning Chronicity:  Recurrent Context: not activity   Relieved by:  Nothing Worsened by:  Nothing tried Associated symptoms: no abdominal pain, no chest pain, no cough, no headaches and no rash   Dizziness Associated symptoms: shortness of breath   Associated symptoms: no chest pain, no diarrhea and no headaches     Past Medical History  Diagnosis Date  . Cardiomyopathy, nonischemic 10/07    EF 10% in 03/2006, 35% in 10/08 and 10-15% in 10/09 normal coronary angiography in 1999  . 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 Pulmonary embolism 07/2005    after total right hip arthroplasty  . Hilar density     infrahilar mass/adenopathy on CT scan 5/07; subsequently  resolved  . LBBB (left bundle branch block)   . GERD (gastroesophageal reflux disease)   . Hyperlipidemia   . Hypertension   . Tobacco abuse     discontinued in 1997, and then resumed  . Urinary incontinence   . Anemia     mild and chronic  . 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 Chronic kidney disease     creatinin-1.44 in 1/09; 1.51 in 1/10  . Automatic implantable cardioverter-defibrillator in situ   . CHF (congestive heart failure)   . Pneumonia 07/2005  . Obstructive sleep apnea     mild-did not tolerate CPAP (01/24/2013)  . Type II diabetes mellitus    . History of blood transfusion     "related to when they took my womb out" (01/24/2013)  . Degenerative joint disease     of knees, shoulder, and hips   Past Surgical History  Procedure Laterality Date  . Pacemaker removal  11/18/05    Enterococcal infection  . Total hip arthroplasty Right 07/2005  . Knee arthroscopy Right 1980's?  . Colonoscopy w/ polypectomy  2009  . Cardioversion N/A 08/20/2012    Procedure: TEE GUIDED CARDIOVERSION;  Surgeon: Kathlen Brunswick, MD;  Location: [facility];  Service: Cardiovascular;  Laterality: N/A;  To be done @ bedside  . Tee without cardioversion N/A 08/20/2012    Procedure: TRANSESOPHAGEAL ECHOCARDIOGRAM (TEE);  Surgeon: Kathlen Brunswick, MD;  Location: [facility];  Service: Cardiovascular;  Laterality: N/A;  . Bi-ventricular implantable cardioverter defibrillator  (crt-d)  01/24/2013  . A-v cardiac pacemaker insertion  12/05    Biventricular pacemaker/AICD  . Abdominal hysterectomy  1990/92    Initial partial hysterectomy followed by BSO  . Tubal ligation  1980's  . Cardiac catheterization    . Colonoscopy with esophagogastroduodenoscopy (egd) N/A 06/10/2013    Procedure: COLONOSCOPY WITH ESOPHAGOGASTRODUODENOSCOPY (EGD);  Surgeon: Malissa Hippo, MD;  Location: [facility];  Service: Endoscopy;  Laterality: N/A;  925   Family History  Problem Relation Age of Onset  . Hypertension Mother   . Diabetes Mother   . Coronary artery disease  Father   . Diabetes Brother   . Hypertension Brother   . Lung cancer Brother   . Arthritis Other   . Diabetes Other   . Heart disease Other     female < 55   History  Substance Use Topics  . Smoking status: Former Smoker -- 12 years    Types: Cigarettes    Quit date: 08/04/2007  . Smokeless tobacco: Never Used     Comment: 01/24/2013 "never inhaled"  . Alcohol Use: No   OB History   Grav Para Term Preterm Abortions TAB SAB Ect Mult Living                 Review of Systems  Constitutional: Negative for  appetite change and fatigue.  HENT: Negative for congestion, ear discharge and sinus pressure.   Eyes: Negative for discharge.  Respiratory: Positive for shortness of breath. Negative for cough.   Cardiovascular: Negative for chest pain.  Gastrointestinal: Negative for abdominal pain and diarrhea.  Genitourinary: Negative for frequency and hematuria.  Musculoskeletal: Negative for back pain.  Skin: Negative for rash.  Neurological: Positive for dizziness. Negative for seizures and headaches.  Psychiatric/Behavioral: Negative for hallucinations.    Allergies  Review of patient's allergies indicates no known allergies.  Home Medications   Current Outpatient Rx  Name  Route  Sig  Dispense  Refill  . carvedilol (COREG) 25 MG tablet   Oral   Take 25 mg by mouth 2 (two) times daily.           . digoxin (LANOXIN) 0.125 MG tablet   Oral   Take 1 tablet (0.125 mg total) by mouth daily.   30 tablet   5   . furosemide (LASIX) 80 MG tablet   Oral   Take 40-80 mg by mouth 2 (two) times daily. 1 tab am. 1/2 tab pm         . HYDROcodone-acetaminophen (NORCO) 10-325 MG per tablet   Oral   Take 1 tablet by mouth 3 (three) times daily as needed for pain (for pain).         Marland Kitchen lisinopril (PRINIVIL,ZESTRIL) 10 MG tablet   Oral   Take 0.5 tablets (5 mg total) by mouth daily.   30 tablet   6   . metFORMIN (GLUCOPHAGE) 1000 MG tablet   Oral   Take 1 tablet (1,000 mg total) by mouth 2 (two) times daily. HOLD for today, then resume usual dose tomorrow, 01/26/2013.         . metoCLOPramide (REGLAN) 5 MG tablet   Oral   Take 5 mg by mouth 4 (four) times daily.         Marland Kitchen oxyCODONE-acetaminophen (PERCOCET) 10-325 MG per tablet   Oral   Take 1 tablet by mouth 4 (four) times daily as needed (for severe pain).          . polyethylene glycol powder (GLYCOLAX/MIRALAX) powder   Oral   Take 17 g by mouth daily as needed (constipation).          . pravastatin (PRAVACHOL) 40 MG  tablet   Oral   Take 80 mg by mouth at bedtime.          Marland Kitchen warfarin (COUMADIN) 5 MG tablet   Oral   Take 2.5 mg by mouth every evening.          BP 128/91  Pulse 102  Temp(Src) 97.2 F (36.2 C) (Oral)  Resp 29  Ht 5\' 5"  (1.651  m)  Wt 160 lb (72.576 kg)  BMI 26.63 kg/m2  SpO2 96% Physical Exam  Constitutional: She is oriented to person, place, and time. She appears well-developed.  HENT:  Head: Normocephalic.  Eyes: Conjunctivae and EOM are normal. No scleral icterus.  Neck: Neck supple. No thyromegaly present.  Cardiovascular: Normal rate and regular rhythm.  Exam reveals no gallop and no friction rub.   No murmur heard. Pulmonary/Chest: No stridor. She has no wheezes. She has no rales. She exhibits no tenderness.  Abdominal: She exhibits no distension. There is no tenderness. There is no rebound.  Musculoskeletal: Normal range of motion. She exhibits no edema.  Lymphadenopathy:    She has no cervical adenopathy.  Neurological: She is oriented to person, place, and time. She exhibits normal muscle tone. Coordination normal.  Skin: No rash noted. No erythema.  Psychiatric: She has a normal mood and affect. Her behavior is normal.    ED Course  Procedures (including critical care time) Labs Review Labs Reviewed  CBC WITH DIFFERENTIAL - Abnormal; Notable for the following:    RBC 3.24 (*)    Hemoglobin 9.7 (*)    HCT 30.2 (*)    All other components within normal limits  COMPREHENSIVE METABOLIC PANEL - Abnormal; Notable for the following:    Glucose, Bld 199 (*)    BUN 32 (*)    Creatinine, Ser 1.48 (*)    GFR calc non Af Amer 35 (*)    GFR calc Af Amer 41 (*)    All other components within normal limits  PRO B NATRIURETIC PEPTIDE - Abnormal; Notable for the following:    Pro B Natriuretic peptide (BNP) 18063.0 (*)    All other components within normal limits  PROTIME-INR - Abnormal; Notable for the following:    Prothrombin Time 20.2 (*)    INR 1.78 (*)     All other components within normal limits  DIGOXIN LEVEL - Abnormal; Notable for the following:    Digoxin Level <0.3 (*)    All other components within normal limits  TROPONIN I   Imaging Review Dg Chest Portable 1 View  06/18/2013   CLINICAL DATA:  Chest pain and shortness of Breath.  EXAM: PORTABLE CHEST - 1 VIEW  COMPARISON:  01/25/2013  FINDINGS: There is moderate enlargement of the cardiopericardial silhouette, which represents a significant change from the prior study.  There is bilateral irregular interstitial thickening and central vascular congestion. No focal lung consolidation. No convincing pleural effusion and no pneumothorax. No mediastinal or hilar masses.  Right anterior chest wall sequential pacemaker is stable and well positioned.  IMPRESSION: Significant enlargement of the cardiopericardial silhouette since the prior study associated with interstitial edema, findings consistent with congestive heart failure.   Electronically Signed   By: Amie Portland M.D.   On: 06/18/2013 19:41    EKG Interpretation    Date/Time:  Saturday June 18 2013 19:33:17 EST Ventricular Rate:  105 PR Interval:    QRS Duration: 164 QT Interval:  404 QTC Calculation: 533 R Axis:   -121 Text Interpretation:  Ventricular-paced rhythm with occasional and consecutive Premature ventricular complexes Biventricular pacemaker detected Abnormal ECG When compared with ECG of 25-Jan-2013 05:29, Premature ventricular complexes are now Present Vent. rate has increased BY  16 BPM Confirmed by Harley Mccartney  MD, Alric Geise ([id number]) on 06/18/2013 11:20:47 PM            MDM  Pt improved with lasix 1. CHF (congestive heart failure)  The chart was scribed for me under my direct supervision.  I personally performed the history, physical, and medical decision making and all procedures in the evaluation of this patient.Benny Lennert, MD 06/18/13 [phone]

## 2013-06-19 ENCOUNTER — Emergency Department (HOSPITAL_COMMUNITY): Payer: Medicare Other

## 2013-06-19 ENCOUNTER — Inpatient Hospital Stay (HOSPITAL_COMMUNITY)
Admission: EM | Admit: 2013-06-19 | Discharge: 2013-06-22 | DRG: 308 | Disposition: A | Discharge status: Home / Self Care | Payer: Medicare Other | Attending: Cardiovascular Disease | Admitting: Cardiovascular Disease

## 2013-06-19 ENCOUNTER — Encounter (HOSPITAL_COMMUNITY): Payer: Self-pay | Admitting: Emergency Medicine

## 2013-06-19 DIAGNOSIS — F17201 Nicotine dependence, unspecified, in remission: Secondary | ICD-10-CM

## 2013-06-19 DIAGNOSIS — I428 Other cardiomyopathies: Secondary | ICD-10-CM | POA: Diagnosis present

## 2013-06-19 DIAGNOSIS — D649 Anemia, unspecified: Secondary | ICD-10-CM | POA: Diagnosis present

## 2013-06-19 DIAGNOSIS — Z95810 Presence of automatic (implantable) cardiac defibrillator: Secondary | ICD-10-CM

## 2013-06-19 DIAGNOSIS — Z7901 Long term (current) use of anticoagulants: Secondary | ICD-10-CM

## 2013-06-19 DIAGNOSIS — E119 Type 2 diabetes mellitus without complications: Secondary | ICD-10-CM | POA: Diagnosis present

## 2013-06-19 DIAGNOSIS — Z8249 Family history of ischemic heart disease and other diseases of the circulatory system: Secondary | ICD-10-CM

## 2013-06-19 DIAGNOSIS — K219 Gastro-esophageal reflux disease without esophagitis: Secondary | ICD-10-CM | POA: Diagnosis present

## 2013-06-19 DIAGNOSIS — N184 Chronic kidney disease, stage 4 (severe): Secondary | ICD-10-CM | POA: Diagnosis present

## 2013-06-19 DIAGNOSIS — I472 Ventricular tachycardia, unspecified: Secondary | ICD-10-CM | POA: Diagnosis present

## 2013-06-19 DIAGNOSIS — I10 Essential (primary) hypertension: Secondary | ICD-10-CM

## 2013-06-19 DIAGNOSIS — I4891 Unspecified atrial fibrillation: Principal | ICD-10-CM | POA: Diagnosis present

## 2013-06-19 DIAGNOSIS — Z96649 Presence of unspecified artificial hip joint: Secondary | ICD-10-CM

## 2013-06-19 DIAGNOSIS — N183 Chronic kidney disease, stage 3 unspecified: Secondary | ICD-10-CM | POA: Diagnosis present

## 2013-06-19 DIAGNOSIS — Z833 Family history of diabetes mellitus: Secondary | ICD-10-CM

## 2013-06-19 DIAGNOSIS — Z801 Family history of malignant neoplasm of trachea, bronchus and lung: Secondary | ICD-10-CM

## 2013-06-19 DIAGNOSIS — I43 Cardiomyopathy in diseases classified elsewhere: Secondary | ICD-10-CM

## 2013-06-19 DIAGNOSIS — I5021 Acute systolic (congestive) heart failure: Secondary | ICD-10-CM

## 2013-06-19 DIAGNOSIS — F17200 Nicotine dependence, unspecified, uncomplicated: Secondary | ICD-10-CM | POA: Diagnosis present

## 2013-06-19 DIAGNOSIS — E785 Hyperlipidemia, unspecified: Secondary | ICD-10-CM

## 2013-06-19 DIAGNOSIS — E1129 Type 2 diabetes mellitus with other diabetic kidney complication: Secondary | ICD-10-CM | POA: Diagnosis present

## 2013-06-19 DIAGNOSIS — Z79899 Other long term (current) drug therapy: Secondary | ICD-10-CM

## 2013-06-19 DIAGNOSIS — I509 Heart failure, unspecified: Secondary | ICD-10-CM

## 2013-06-19 DIAGNOSIS — Z87891 Personal history of nicotine dependence: Secondary | ICD-10-CM

## 2013-06-19 DIAGNOSIS — I447 Left bundle-branch block, unspecified: Secondary | ICD-10-CM | POA: Diagnosis present

## 2013-06-19 DIAGNOSIS — I4729 Other ventricular tachycardia: Secondary | ICD-10-CM | POA: Diagnosis present

## 2013-06-19 DIAGNOSIS — G4733 Obstructive sleep apnea (adult) (pediatric): Secondary | ICD-10-CM | POA: Diagnosis present

## 2013-06-19 DIAGNOSIS — Z86711 Personal history of pulmonary embolism: Secondary | ICD-10-CM

## 2013-06-19 DIAGNOSIS — M1990 Unspecified osteoarthritis, unspecified site: Secondary | ICD-10-CM | POA: Diagnosis present

## 2013-06-19 DIAGNOSIS — I5023 Acute on chronic systolic (congestive) heart failure: Secondary | ICD-10-CM | POA: Diagnosis present

## 2013-06-19 DIAGNOSIS — I4819 Other persistent atrial fibrillation: Secondary | ICD-10-CM

## 2013-06-19 DIAGNOSIS — Z720 Tobacco use: Secondary | ICD-10-CM

## 2013-06-19 DIAGNOSIS — I129 Hypertensive chronic kidney disease with stage 1 through stage 4 chronic kidney disease, or unspecified chronic kidney disease: Secondary | ICD-10-CM | POA: Diagnosis present

## 2013-06-19 HISTORY — DX: Paroxysmal atrial fibrillation: I48.0

## 2013-06-19 HISTORY — DX: Chronic kidney disease, stage 3 (moderate): N18.3

## 2013-06-19 HISTORY — DX: Chronic systolic (congestive) heart failure: I50.22

## 2013-06-19 HISTORY — DX: Chronic kidney disease, stage 3 unspecified: N18.30

## 2013-06-19 LAB — CBC WITH DIFFERENTIAL/PLATELET
Basophils Absolute: 0 10*3/uL (ref 0.0–0.1)
Eosinophils Absolute: 0 10*3/uL (ref 0.0–0.7)
Eosinophils Relative: 0 % (ref 0–5)
Hemoglobin: 10.7 g/dL — ABNORMAL LOW (ref 12.0–15.0)
MCH: 30.2 pg (ref 26.0–34.0)
MCV: 92.1 fL (ref 78.0–100.0)
Neutrophils Relative %: 79 % — ABNORMAL HIGH (ref 43–77)
Platelets: 266 10*3/uL (ref 150–400)
RDW: 13.7 % (ref 11.5–15.5)
WBC: 8.1 10*3/uL (ref 4.0–10.5)

## 2013-06-19 LAB — BASIC METABOLIC PANEL
CO2: 26 mEq/L (ref 19–32)
Calcium: 9.9 mg/dL (ref 8.4–10.5)
Creatinine, Ser: 1.34 mg/dL — ABNORMAL HIGH (ref 0.50–1.10)
GFR calc Af Amer: 46 mL/min — ABNORMAL LOW (ref >90)
GFR calc non Af Amer: 39 mL/min — ABNORMAL LOW (ref >90)
Potassium: 3.5 mEq/L (ref 3.5–5.1)

## 2013-06-19 LAB — URINALYSIS, ROUTINE W REFLEX MICROSCOPIC
Bilirubin Urine: NEGATIVE
Glucose, UA: NEGATIVE mg/dL
Hgb urine dipstick: NEGATIVE
Ketones, ur: NEGATIVE mg/dL
Protein, ur: NEGATIVE mg/dL
Specific Gravity, Urine: 1.01 (ref 1.005–1.030)
Urobilinogen, UA: 0.2 mg/dL (ref 0.0–1.0)

## 2013-06-19 LAB — GLUCOSE, CAPILLARY
Glucose-Capillary: 172 mg/dL — ABNORMAL HIGH (ref 70–99)
Glucose-Capillary: 158 mg/dL — ABNORMAL HIGH (ref 70–99)

## 2013-06-19 LAB — PROTIME-INR
INR: 2.16 — ABNORMAL HIGH (ref 0.00–1.49)
Prothrombin Time: 23.4 seconds — ABNORMAL HIGH (ref 11.6–15.2)

## 2013-06-19 LAB — MAGNESIUM: Magnesium: 1.7 mg/dL (ref 1.5–2.5)

## 2013-06-19 LAB — TROPONIN I: Troponin I: <0.3 ng/mL (ref <0.30)

## 2013-06-19 MED ORDER — FUROSEMIDE 10 MG/ML IJ SOLN
40.0000 mg | Freq: Once | INTRAMUSCULAR | Status: AC
  Administered 2013-06-19: 40 mg via INTRAVENOUS
  Filled 2013-06-19: qty 4

## 2013-06-19 MED ORDER — CARVEDILOL 25 MG PO TABS
25.0000 mg | ORAL_TABLET | Freq: Two times a day (BID) | ORAL | Status: DC
  Administered 2013-06-19 – 2013-06-22 (×6): 25 mg via ORAL
  Filled 2013-06-19 (×8): qty 1

## 2013-06-19 MED ORDER — DIGOXIN 125 MCG PO TABS
0.1250 mg | ORAL_TABLET | Freq: Every day | ORAL | Status: DC
  Administered 2013-06-19 – 2013-06-22 (×4): 0.125 mg via ORAL
  Filled 2013-06-19 (×4): qty 1

## 2013-06-19 MED ORDER — DIGOXIN 125 MCG PO TABS
0.1250 mg | ORAL_TABLET | Freq: Every day | ORAL | Status: DC
  Filled 2013-06-19: qty 1

## 2013-06-19 MED ORDER — SIMVASTATIN 10 MG PO TABS
10.0000 mg | ORAL_TABLET | Freq: Every day | ORAL | Status: DC
  Administered 2013-06-19 – 2013-06-21 (×3): 10 mg via ORAL
  Filled 2013-06-19 (×4): qty 1

## 2013-06-19 MED ORDER — WARFARIN SODIUM 2.5 MG PO TABS: 2.5000 mg | ORAL_TABLET | Freq: Once | ORAL | Status: DC

## 2013-06-19 MED ORDER — AMIODARONE LOAD VIA INFUSION
150.0000 mg | Freq: Once | INTRAVENOUS | Status: AC
  Administered 2013-06-19: 150 mg via INTRAVENOUS
  Filled 2013-06-19: qty 83.34

## 2013-06-19 MED ORDER — DILTIAZEM HCL 100 MG IV SOLR
5.0000 mg/h | INTRAVENOUS | Status: DC
  Filled 2013-06-19: qty 100

## 2013-06-19 MED ORDER — WARFARIN - PHARMACIST DOSING INPATIENT
Freq: Every day | Status: DC
  Administered 2013-06-21: 2.5

## 2013-06-19 MED ORDER — AMIODARONE HCL IN DEXTROSE 360-4.14 MG/200ML-% IV SOLN
60.0000 mg/h | INTRAVENOUS | Status: AC
  Administered 2013-06-19 (×2): 60 mg/h via INTRAVENOUS
  Filled 2013-06-19 (×2): qty 200

## 2013-06-19 MED ORDER — ONDANSETRON HCL 4 MG/2ML IJ SOLN: 4.0000 mg | Freq: Four times a day (QID) | INTRAMUSCULAR | Status: DC | PRN

## 2013-06-19 MED ORDER — INSULIN ASPART 100 UNIT/ML ~~LOC~~ SOLN
0.0000 [IU] | Freq: Three times a day (TID) | SUBCUTANEOUS | Status: DC
  Administered 2013-06-20: 3 [IU] via SUBCUTANEOUS
  Administered 2013-06-21: 5 [IU] via SUBCUTANEOUS
  Administered 2013-06-21 – 2013-06-22 (×3): 3 [IU] via SUBCUTANEOUS

## 2013-06-19 MED ORDER — AMIODARONE HCL IN DEXTROSE 360-4.14 MG/200ML-% IV SOLN
30.0000 mg/h | INTRAVENOUS | Status: DC
  Administered 2013-06-20 (×2): 30 mg/h via INTRAVENOUS
  Filled 2013-06-19 (×7): qty 200

## 2013-06-19 MED ORDER — FUROSEMIDE 10 MG/ML IJ SOLN
40.0000 mg | Freq: Two times a day (BID) | INTRAMUSCULAR | Status: DC
  Administered 2013-06-19 – 2013-06-21 (×5): 40 mg via INTRAVENOUS
  Filled 2013-06-19 (×7): qty 4

## 2013-06-19 MED ORDER — POLYETHYLENE GLYCOL 3350 17 GM/SCOOP PO POWD
17.0000 g | Freq: Every day | ORAL | Status: DC | PRN
  Filled 2013-06-19: qty 255

## 2013-06-19 MED ORDER — POTASSIUM CHLORIDE CRYS ER 20 MEQ PO TBCR
40.0000 meq | EXTENDED_RELEASE_TABLET | Freq: Once | ORAL | Status: AC
  Administered 2013-06-19: 40 meq via ORAL
  Filled 2013-06-19: qty 2

## 2013-06-19 MED ORDER — SODIUM CHLORIDE 0.9 % IJ SOLN
3.0000 mL | INTRAMUSCULAR | Status: DC | PRN
  Administered 2013-06-21 (×2): 3 mL via INTRAVENOUS

## 2013-06-19 MED ORDER — POLYETHYLENE GLYCOL 3350 17 G PO PACK
17.0000 g | PACK | Freq: Every day | ORAL | Status: DC | PRN
  Filled 2013-06-19: qty 1

## 2013-06-19 MED ORDER — HYDROCODONE-ACETAMINOPHEN 10-325 MG PO TABS
1.0000 | ORAL_TABLET | Freq: Three times a day (TID) | ORAL | Status: DC | PRN
  Administered 2013-06-22: 1 via ORAL
  Filled 2013-06-19: qty 1

## 2013-06-19 MED ORDER — POTASSIUM CHLORIDE CRYS ER 20 MEQ PO TBCR
20.0000 meq | EXTENDED_RELEASE_TABLET | Freq: Two times a day (BID) | ORAL | Status: DC
  Administered 2013-06-19 – 2013-06-22 (×6): 20 meq via ORAL
  Filled 2013-06-19 (×8): qty 1

## 2013-06-19 MED ORDER — MAGNESIUM SULFATE 40 MG/ML IJ SOLN
2.0000 g | Freq: Once | INTRAMUSCULAR | Status: AC
  Administered 2013-06-19: 2 g via INTRAVENOUS
  Filled 2013-06-19: qty 50

## 2013-06-19 MED ORDER — WARFARIN SODIUM 2.5 MG PO TABS
2.5000 mg | ORAL_TABLET | Freq: Once | ORAL | Status: AC
  Administered 2013-06-19: 2.5 mg via ORAL
  Filled 2013-06-19: qty 1

## 2013-06-19 MED ORDER — LISINOPRIL 5 MG PO TABS
5.0000 mg | ORAL_TABLET | Freq: Every day | ORAL | Status: DC
  Administered 2013-06-19 – 2013-06-22 (×4): 5 mg via ORAL
  Filled 2013-06-19 (×4): qty 1

## 2013-06-19 MED ORDER — SODIUM CHLORIDE 0.9 % IV SOLN: 250.0000 mL | INTRAVENOUS | Status: DC | PRN

## 2013-06-19 MED ORDER — METOCLOPRAMIDE HCL 5 MG PO TABS
5.0000 mg | ORAL_TABLET | Freq: Four times a day (QID) | ORAL | Status: DC
  Administered 2013-06-19 – 2013-06-22 (×10): 5 mg via ORAL
  Filled 2013-06-19 (×15): qty 1

## 2013-06-19 MED ORDER — SODIUM CHLORIDE 0.9 % IJ SOLN
3.0000 mL | Freq: Two times a day (BID) | INTRAMUSCULAR | Status: DC
  Administered 2013-06-19 – 2013-06-22 (×5): 3 mL via INTRAVENOUS

## 2013-06-19 NOTE — ED Notes (Signed)
Pt states she feels like her breathing is worse.  Pt appears to be in some respiratory distress.  Pulled oxygen off and sats in the upper 80's.  Wide QRS tachycardia. Rate around 130-150.  Placed pt back on oxygen.  And notified Dr. Adriana Simas of pt condition.

## 2013-06-19 NOTE — ED Notes (Signed)
Pt resting with eyes shut.  Easily arrouses.  States she feels better.  Pt decreased respiratory distress.

## 2013-06-19 NOTE — ED Provider Notes (Signed)
CSN: [id number]     Arrival date & time 06/19/13  0719 History   First MD Initiated Contact with Patient 06/19/13 [phone]     Chief Complaint  Patient presents with  . Shortness of Breath   (Consider location/radiation/quality/duration/timing/severity/associated sxs/prior Treatment) HPI.... level V caveat for urgent need for intervention.    Shortness of breath for 3 days getting worse. Patient seen in emergency department last night and given Lasix.  She returns today with persistent dyspnea.   No chest pain, fever, chills. Status post defibrillator placement in 2014 by Dr. Lewayne Bunting.   Severity is moderate to severe. She has a host of cardiac risk factors and cardiac diagnoses.  Past Medical History  Diagnosis Date  . Cardiomyopathy, nonischemic 10/07    EF 10% in 03/2006, 35% in 10/08 and 10-15% in 10/09 normal coronary angiography in 1999  . 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 Pulmonary embolism 07/2005    after total right hip arthroplasty  . Hilar density     infrahilar mass/adenopathy on CT scan 5/07; subsequently  resolved  . LBBB (left bundle branch block)   . GERD (gastroesophageal reflux disease)   . Hyperlipidemia   . Hypertension   . Tobacco abuse     discontinued in 1997, and then resumed  . Urinary incontinence   . Anemia     mild and chronic  . 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 Chronic kidney disease     creatinin-1.44 in 1/09; 1.51 in 1/10  . Automatic implantable cardioverter-defibrillator in situ   . CHF (congestive heart failure)   . Pneumonia 07/2005  . Obstructive sleep apnea     mild-did not tolerate CPAP (01/24/2013)  . Type II diabetes mellitus   . History of blood transfusion     "related to when they took my womb out" (01/24/2013)  . Degenerative joint disease     of knees, shoulder, and hips   Past Surgical History  Procedure Laterality Date  .  Pacemaker removal  11/18/05    Enterococcal infection  . Total hip arthroplasty Right 07/2005  . Knee arthroscopy Right 1980's?  . Colonoscopy w/ polypectomy  2009  . Cardioversion N/A 08/20/2012    Procedure: TEE GUIDED CARDIOVERSION;  Surgeon: Kathlen Brunswick, MD;  Location: [facility];  Service: Cardiovascular;  Laterality: N/A;  To be done @ bedside  . Tee without cardioversion N/A 08/20/2012    Procedure: TRANSESOPHAGEAL ECHOCARDIOGRAM (TEE);  Surgeon: Kathlen Brunswick, MD;  Location: [facility];  Service: Cardiovascular;  Laterality: N/A;  . Bi-ventricular implantable cardioverter defibrillator  (crt-d)  01/24/2013  . A-v cardiac pacemaker insertion  12/05    Biventricular pacemaker/AICD  . Abdominal hysterectomy  1990/92    Initial partial hysterectomy followed by BSO  . Tubal ligation  1980's  . Cardiac catheterization    . Colonoscopy with esophagogastroduodenoscopy (egd) N/A 06/10/2013    Procedure: COLONOSCOPY WITH ESOPHAGOGASTRODUODENOSCOPY (EGD);  Surgeon: Malissa Hippo, MD;  Location: [facility];  Service: Endoscopy;  Laterality: N/A;  925   Family History  Problem Relation Age of Onset  . Hypertension Mother   . Diabetes Mother   . Coronary artery disease Father   . Diabetes Brother   . Hypertension Brother   . Lung cancer Brother   . Arthritis Other   . Diabetes Other   . Heart disease Other  female < 55   History  Substance Use Topics  . Smoking status: Former Smoker -- 12 years    Types: Cigarettes    Quit date: 08/04/2007  . Smokeless tobacco: Never Used     Comment: 01/24/2013 "never inhaled"  . Alcohol Use: No   OB History   Grav Para Term Preterm Abortions TAB SAB Ect Mult Living                 Review of Systems  Unable to perform ROS: Acuity of condition    Allergies  Review of patient's allergies indicates no known allergies.  Home Medications   Current Outpatient Rx  Name  Route  Sig  Dispense  Refill  . carvedilol (COREG) 25 MG tablet    Oral   Take 25 mg by mouth 2 (two) times daily.           . digoxin (LANOXIN) 0.125 MG tablet   Oral   Take 1 tablet (0.125 mg total) by mouth daily.   30 tablet   5   . furosemide (LASIX) 80 MG tablet   Oral   Take 40-80 mg by mouth 2 (two) times daily. 1 tab am. 1/2 tab pm         . HYDROcodone-acetaminophen (NORCO) 10-325 MG per tablet   Oral   Take 1 tablet by mouth 3 (three) times daily as needed for pain (for pain).         Marland Kitchen lisinopril (PRINIVIL,ZESTRIL) 10 MG tablet   Oral   Take 0.5 tablets (5 mg total) by mouth daily.   30 tablet   6   . metFORMIN (GLUCOPHAGE) 1000 MG tablet   Oral   Take 1 tablet (1,000 mg total) by mouth 2 (two) times daily. HOLD for today, then resume usual dose tomorrow, 01/26/2013.         . metoCLOPramide (REGLAN) 5 MG tablet   Oral   Take 5 mg by mouth 4 (four) times daily.         Marland Kitchen oxyCODONE-acetaminophen (PERCOCET) 10-325 MG per tablet   Oral   Take 1 tablet by mouth 4 (four) times daily as needed (for severe pain).          . polyethylene glycol powder (GLYCOLAX/MIRALAX) powder   Oral   Take 17 g by mouth daily as needed (constipation).          . pravastatin (PRAVACHOL) 40 MG tablet   Oral   Take 80 mg by mouth at bedtime.          Marland Kitchen warfarin (COUMADIN) 5 MG tablet   Oral   Take 2.5 mg by mouth every evening.          BP 140/94  Pulse 103  Temp(Src) 98 F (36.7 C) (Oral)  Resp 32  Ht 5\' 5"  (1.651 m)  Wt 160 lb (72.576 kg)  BMI 26.63 kg/m2  SpO2 97% Physical Exam  Nursing note and vitals reviewed. Constitutional: She is oriented to person, place, and time. She appears well-developed and well-nourished.  HENT:  Head: Normocephalic and atraumatic.  Eyes: Conjunctivae and EOM are normal. Pupils are equal, round, and reactive to light.  Neck: Normal range of motion. Neck supple.  Cardiovascular: Normal rate and normal heart sounds.   Irregular rhythm  Pulmonary/Chest: Effort normal.  Middle  basilar  rales  Abdominal: Soft. Bowel sounds are normal.  Musculoskeletal: Normal range of motion.  Neurological: She is alert and oriented to person, place, and  time.  Skin: Skin is warm and dry.  Minimal peripheral edema  Psychiatric: She has a normal mood and affect. Her behavior is normal.    ED Course  Procedures (including critical care time) Labs Review Labs Reviewed  BASIC METABOLIC PANEL - Abnormal; Notable for the following:    Glucose, Bld 223 (*)    BUN 30 (*)    Creatinine, Ser 1.34 (*)    GFR calc non Af Amer 39 (*)    GFR calc Af Amer 46 (*)    All other components within normal limits  CBC WITH DIFFERENTIAL - Abnormal; Notable for the following:    RBC 3.54 (*)    Hemoglobin 10.7 (*)    HCT 32.6 (*)    Neutrophils Relative % 79 (*)    All other components within normal limits  PRO B NATRIURETIC PEPTIDE - Abnormal; Notable for the following:    Pro B Natriuretic peptide (BNP) 23379.0 (*)    All other components within normal limits  TROPONIN I  URINALYSIS, ROUTINE W REFLEX MICROSCOPIC  MAGNESIUM   Imaging Review Dg Chest Port 1 View  06/19/2013   CLINICAL DATA:  Short of breath  EXAM: PORTABLE CHEST - 1 VIEW  COMPARISON:  06/18/2013  FINDINGS: Cardiomegaly persists. Interstitial edema persists and is worsened. Small amount of accumulating pleural fluid. No other new finding.  IMPRESSION: Continued and worsened congestive heart failure with worsening edema and early pleural fluid accumulation.   Electronically Signed   By: Paulina Fusi M.D.   On: 06/19/2013 08:32   Dg Chest Portable 1 View  06/18/2013   CLINICAL DATA:  Chest pain and shortness of Breath.  EXAM: PORTABLE CHEST - 1 VIEW  COMPARISON:  01/25/2013  FINDINGS: There is moderate enlargement of the cardiopericardial silhouette, which represents a significant change from the prior study.  There is bilateral irregular interstitial thickening and central vascular congestion. No focal lung consolidation. No  convincing pleural effusion and no pneumothorax. No mediastinal or hilar masses.  Right anterior chest wall sequential pacemaker is stable and well positioned.  IMPRESSION: Significant enlargement of the cardiopericardial silhouette since the prior study associated with interstitial edema, findings consistent with congestive heart failure.   Electronically Signed   By: Amie Portland M.D.   On: 06/18/2013 19:41    EKG Interpretation    Date/Time:  Sunday June 19 2013 07:52:48 EST Ventricular Rate:  111 PR Interval:    QRS Duration: 176 QT Interval:  400 QTC Calculation: 544 R Axis:   -92 Text Interpretation:  Ventricular-paced rhythm with occasional and consecutive Premature ventricular complexes Biventricular pacemaker detected Abnormal ECG When compared with ECG of 18-Jun-2013 19:33, Vent. rate has increased BY   6 BPM Confirmed by Jamyson Jirak  MD, Jarrett Chicoine ([id number]) on 06/19/2013 9:02:28 AM           CRITICAL CARE Performed by: Donnetta Hutching Total critical care time: 40 Critical care time was exclusive of separately billable procedures and treating other patients. Critical care was necessary to treat or prevent imminent or life-threatening deterioration. Critical care was time spent personally by me on the following activities: development of treatment plan with patient and/or surrogate as well as nursing, discussions with consultants, evaluation of patient's response to treatment, examination of patient, obtaining history from patient or surrogate, ordering and performing treatments and interventions, ordering and review of laboratory studies, ordering and review of radiographic studies, pulse oximetry and re-evaluation of patient's condition. MDM   1. CHF (congestive heart failure)  Intravenous Lasix administered for congestive heart failure. Patient noted to have several short runs of V. tach. Discussed with cardiologist. Will replace potassium. Magnesium pending. Transfer to [employer].    Donnetta Hutching, MD 06/19/13 [phone]

## 2013-06-19 NOTE — Progress Notes (Signed)
ANTICOAGULATION CONSULT NOTE - Initial Consult  Pharmacy Consult for heparin and coumadin Indication: atrial fibrillation  No Known Allergies  Patient Measurements: Height: 5\' 5"  (165.1 cm) Weight: 160 lb (72.576 kg) IBW/kg (Calculated) : 57 Heparin Dosing Weight: 72 kg  Vital Signs: Temp: 98.3 F (36.8 C) (12/28 1500) Temp src: Oral (12/28 1500) BP: 125/98 mmHg (12/28 1600) Pulse Rate: 122 (12/28 1600)  Labs:  Recent Labs  06/18/13 1951 06/19/13 0804  HGB 9.7* 10.7*  HCT 30.2* 32.6*  PLT 229 266  LABPROT 20.2*  --   INR 1.78*  --   CREATININE 1.48* 1.34*  TROPONINI <0.30 <0.30    Estimated Creatinine Clearance: 39.5 ml/min (by C-G formula based on Cr of 1.34).   Medical History: Past Medical History  Diagnosis Date  . Cardiomyopathy, nonischemic     a. 1999 nl cath;  b. 12/05 Guidant BiVICD;  c. 10/2005 ICD extraction 2/2 enterococcus bacteremia and Veg on RV lead;  c. 05/2008 low risk Myoview (scarring w/ some evidence of inf ischemia);  d. 11/2012 Echo: EF 15-20%;  e. 01/2013 s/p MDT Coletta Memos CRT D, ser # [id number].  . Enterococcal infection     a. 10/2005 - AICD-explanted  . Pulmonary embolism     a. 07/2005 after total right hip arthroplasty  . Hilar density     a. infrahilar mass/adenopathy on CT scan 5/07; subsequently  resolved  . LBBB (left bundle branch block)   . GERD (gastroesophageal reflux disease)   . Hyperlipidemia   . Hypertension   . Tobacco abuse     a. discontinued in 1997, and then resumed  . Urinary incontinence   . Anemia     a. mild/chronic  . Villous adenoma of colon     a. tubovillous adenomatous polyp with focal high grade dysplasia; presented with hematochezia - followed by Dr. Karilyn Cota.  . CKD (chronic kidney disease), stage III     creatinin-1.44 in 1/09; 1.51 in 1/10  . Automatic implantable cardioverter-defibrillator in situ     a.  01/2013 s/p MDT Coletta Memos CRT D, ser # [id number]  . Chronic systolic CHF (congestive heart failure)      a. 11/2012 Echo: EF 15-20%.  . Pneumonia 07/2005  . Obstructive sleep apnea     a. mild-did not tolerate CPAP (01/24/2013)  . Type II diabetes mellitus   . History of blood transfusion   . Degenerative joint disease     of knees, shoulder, and hips  . PAF (paroxysmal atrial fibrillation)     a. 07/2012 s/p TEE/DCCV;  b. chronic coumadin.  Marland Kitchen NSVT (nonsustained ventricular tachycardia)     Medications:  See med rec  Assessment: Patient is a 67 y.o F on coumadin PTA for afib. She is now in afib with rvr.  Patient stated that her home dose is 2.5mg  daily with last dose taken on 12/26.  INR was 1.78 on 12/27 and up to 2.16 today.  Goal of Therapy:  INR 2-3 Heparin level 0.3-0.7 units/ml Monitor platelets by anticoagulation protocol: Yes   Plan:  1) will hold on off on starting heparin since INR is therapeutic 2) coumadin 2.5mg  daily x1 today  Arnetha Silverthorne P 06/19/2013,5:02 PM

## 2013-06-19 NOTE — H&P (Signed)
The patient was seen and examined, and I agree with the assessment and plan as documented above. Pt with complex cardiovascular history admitted with rapid atrial fibrillation, ventricular tachycardia, and decompensated systolic heart failure. She has had progressive dyspnea over the past 2-3 weeks, but moreso in the past few days, with leg swelling as well. Has had episodic chest pains. Received IV Lasix at [facility] ED. Will continue IV Lasix along with Coreg, digoxin, and ACEI. GFR 39 ml/min so will hold off on spironolactone for now. Dig level <0.3 on 12.27. Mag level 1.7 so will replete with aim to keep > 2, in order to attenuate runs of VT/NSVT. Will initiate IV amiodarone given rapid atrial fibrillation with multiple runs of both sustained and non-sustained VT. Will resume anticoagulation with heparin/warfarin. INR 1.78 on 12/27. Given her severe LV dysfunction and need for atrial kick (EF 15-20% by echo on 12/07/2012), will plan for cardioversion (+/- TEE, depending on what ICD interrogation shows with respect to onset of atrial fibrillation).

## 2013-06-19 NOTE — H&P (Signed)
Patient ID: [name] MRN: [id number], DOB/AGE: 08-21-1945   Admit date: 06/19/2013   Primary Physician: Milana Obey, MD Primary Cardiologist: Reece Agar. Ladona Ridgel, MD ([place])  Pt. Profile:  67 y/o female with a long h/o NICM s/p bi-V ICD in August who was also dx with PAF in February (s/p DCCV) who presented back to the ER last night and again with progressive dyspnea and afib.  Problem List  Past Medical History  Diagnosis Date  . Cardiomyopathy, nonischemic     a. 1999 nl cath;  b. 12/05 Guidant BiVICD;  c. 10/2005 ICD extraction 2/2 enterococcus bacteremia and Veg on RV lead;  c. 05/2008 low risk Myoview (scarring w/ some evidence of inf ischemia);  d. 11/2012 Echo: EF 15-20%;  e. 01/2013 s/p MDT Coletta Memos CRT D, ser # [id number].  . Enterococcal infection     a. 10/2005 - AICD-explanted  . Pulmonary embolism     a. 07/2005 after total right hip arthroplasty  . Hilar density     a. infrahilar mass/adenopathy on CT scan 5/07; subsequently  resolved  . LBBB (left bundle branch block)   . GERD (gastroesophageal reflux disease)   . Hyperlipidemia   . Hypertension   . Tobacco abuse     a. discontinued in 1997, and then resumed  . Urinary incontinence   . Anemia     a. mild/chronic  . Villous adenoma of colon     a. tubovillous adenomatous polyp with focal high grade dysplasia; presented with hematochezia - followed by Dr. Karilyn Cota.  . CKD (chronic kidney disease), stage III     creatinin-1.44 in 1/09; 1.51 in 1/10  . Automatic implantable cardioverter-defibrillator in situ     a.  01/2013 s/p MDT Coletta Memos CRT D, ser # [id number]  . Chronic systolic CHF (congestive heart failure)     a. 11/2012 Echo: EF 15-20%.  . Pneumonia 07/2005  . Obstructive sleep apnea     a. mild-did not tolerate CPAP (01/24/2013)  . Type II diabetes mellitus   . History of blood transfusion   . Degenerative joint disease     of knees, shoulder, and hips  . PAF (paroxysmal atrial fibrillation)     a.  07/2012 s/p TEE/DCCV;  b. chronic coumadin.    Past Surgical History  Procedure Laterality Date  . Pacemaker removal  11/18/05    Enterococcal infection  . Total hip arthroplasty Right 07/2005  . Knee arthroscopy Right 1980's?  . Colonoscopy w/ polypectomy  2009  . Cardioversion N/A 08/20/2012    Procedure: TEE GUIDED CARDIOVERSION;  Surgeon: Kathlen Brunswick, MD;  Location: [facility];  Service: Cardiovascular;  Laterality: N/A;  To be done @ bedside  . Tee without cardioversion N/A 08/20/2012    Procedure: TRANSESOPHAGEAL ECHOCARDIOGRAM (TEE);  Surgeon: Kathlen Brunswick, MD;  Location: [facility];  Service: Cardiovascular;  Laterality: N/A;  . Bi-ventricular implantable cardioverter defibrillator  (crt-d)  01/24/2013  . A-v cardiac pacemaker insertion  12/05    Biventricular pacemaker/AICD  . Abdominal hysterectomy  1990/92    Initial partial hysterectomy followed by BSO  . Tubal ligation  1980's  . Cardiac catheterization    . Colonoscopy with esophagogastroduodenoscopy (egd) N/A 06/10/2013    Procedure: COLONOSCOPY WITH ESOPHAGOGASTRODUODENOSCOPY (EGD);  Surgeon: Malissa Hippo, MD;  Location: [facility];  Service: Endoscopy;  Laterality: N/A;  925     Allergies  No Known Allergies  HPI  67 year old female with the above complex medical history.  She has a history of nonischemic cardio myopathy with an EF of 15-20%. She is status post biventricular ICD placement in 2005 with subsequent extraction in 2007 related to enteric coccus bacteremia and vegetations on her RV lead. Earlier this year, she was diagnosed with atrial fibrillation. She underwent successful TEE and cardioversion in February and has been maintained on beta blocker and warfarin therapy. Her INRs are followed by her primary care provider. Over the summer, she was referred back to electrophysiology for replacement of biventricular ICD and this was performed in August. It does not appear that she's been seen back in our clinic  since then. At baseline, she has some degree of chronic dyspnea exertion as well as fatigue. She weighs herself a regular basis and says that she is typically about 160 pounds. Her weight will sometimes fluctuate up a few pounds but then usually returns without having to take any additional diuretic. She had a colonoscopy on Friday, December 19, and in doing so, came off of Coumadin prior to the procedure with a Lovenox bridge both before and then after colonoscopy.   Over the last 3 days, patient has been experiencing more dyspnea exertion than usual. She has also noted mild lower extremity edema, early satiety, and intermittent nausea. She presented to the ED at [facility] last night and was felt to have volume overload on exam. She was treated with IV Lasix and sent home. Her ECG showed atrial fibrillation with rapid ventricular response and pacing on demand. This does not appear to have been addressed. After receiving Lasix in the ED, she briefly felt like her breathing had improved but after going home, she became orthopneic while sleeping and dyspnea worsened again. She presented back to the ED today and again was tachycardic and in atrial fibrillation. She was also noted to have asymptomatic runs of nonsustained ventricular tachycardia. Chest x-ray shows worsening CHF. Decision [facility]. Currently she is dyspneic with speech but otherwise in no distress. She remains in rapid atrial fibrillation with frequent runs of nonsustained ventricular tachycardia. She denies any palpitations. Home Medications  Prior to Admission medications   Medication Sig Start Date End Date Taking? Authorizing Provider  carvedilol (COREG) 25 MG tablet Take 25 mg by mouth 2 (two) times daily.     Yes Historical Provider, MD  digoxin (LANOXIN) 0.125 MG tablet Take 1 tablet (0.125 mg total) by mouth daily. 08/23/12  Yes Jodelle Gross, NP  furosemide (LASIX) 80 MG  tablet Take 40-80 mg by mouth 2 (two) times daily. 1 tab am. 1/2 tab pm   Yes Historical Provider, MD  HYDROcodone-acetaminophen (NORCO) 10-325 MG per tablet Take 1 tablet by mouth 3 (three) times daily as needed for pain (for pain).   Yes Historical Provider, MD  lisinopril (PRINIVIL,ZESTRIL) 10 MG tablet Take 0.5 tablets (5 mg total) by mouth daily. 01/05/13  Yes Marinus Maw, MD  metFORMIN (GLUCOPHAGE) 1000 MG tablet Take 1 tablet (1,000 mg total) by mouth 2 (two) times daily. HOLD for today, then resume usual dose tomorrow, 01/26/2013. 01/25/13  Yes Brooke O Edmisten, PA-C  metoCLOPramide (REGLAN) 5 MG tablet Take 5 mg by mouth 4 (four) times daily.   Yes Historical Provider, MD  oxyCODONE-acetaminophen (PERCOCET) 10-325 MG per tablet Take 1 tablet by mouth 4 (four) times daily as needed (for severe pain).  06/07/13  Yes Historical Provider, MD  polyethylene glycol powder (GLYCOLAX/MIRALAX) powder Take 17 g by mouth  daily as needed (constipation).  05/04/12  Yes Historical Provider, MD  pravastatin (PRAVACHOL) 40 MG tablet Take 80 mg by mouth at bedtime.    Yes Historical Provider, MD  warfarin (COUMADIN) 5 MG tablet Take 2.5 mg by mouth every evening.   Yes Historical Provider, MD   Family History  Family History  Problem Relation Age of Onset  . Hypertension Mother   . Diabetes Mother   . Coronary artery disease Father   . Diabetes Brother   . Hypertension Brother   . Lung cancer Brother   . Arthritis Other   . Diabetes Other   . Heart disease Other     female < 79   Social History  History   Social History  . Marital Status: Married    Spouse Name: N/A    Number of Children: N/A  . Years of Education: N/A   Occupational History  . Not on file.   Social History Main Topics  . Smoking status: Former Smoker -- 12 years    Types: Cigarettes    Quit date: 08/04/2007  . Smokeless tobacco: Never Used     Comment: 01/24/2013 "never inhaled"  . Alcohol Use: No  . Drug Use: No    . Sexual Activity: Not Currently    Birth Control/ Protection: None   Other Topics Concern  . Not on file   Social History Narrative   Married, lives with spouse. Retired Advertising copywriter.     Review of Systems General:  No chills, fever, night sweats or weight changes.  Cardiovascular:  Occasional chest pain occurring about once a week, at rest, and lasting several hours at a time. She has chronic dyspnea on exertion,  she has had bilateral lower extremity edema over the past 3 days,  she has chronic 2 pillow orthopnea which has worsened in the past 24 hours,  she denies palpitations, paroxysmal nocturnal dyspnea. Dermatological: No rash, lesions/masses Respiratory: No cough,  Positive dyspnea Urologic: No hematuria, dysuria Abdominal:   +++ nausea and early satiety.  No vomiting, diarrhea, bright red blood per rectum, melena, or hematemesis Neurologic:  No visual changes, wkns, changes in mental status. All other systems reviewed and are otherwise negative except as noted above.  Physical Exam  Blood pressure 121/88, pulse 108, temperature 98.3 F (36.8 C), temperature source Oral, resp. rate 16, height 5\' 5"  (1.651 m), weight 160 lb (72.576 kg), SpO2 98.00%.  General: Pleasant, speech is labored. Psych: Normal affect. Neuro: Alert and oriented X 3. Moves all extremities spontaneously. HEENT: Normal  Neck: Supple without bruits.  JVP ~ 10-12 cm. Lungs:  Resp regular and unlabored at rest.  Few basilar crackles, otw cta. Heart: ir, ir, tachy no s3, s4, or murmurs. Abdomen: Soft, diffusely tender ("sore"), non-distended, BS + x 4.  Extremities: No clubbing, cyanosis, trace bilat LE edema. DP/PT/Radials 1+ and equal bilaterally.  Labs  Troponin [facility] of Care Test) No results found for this basename: TROPIPOC,  in the last 72 hours  Recent Labs  06/18/13 1951 06/19/13 0804  TROPONINI <0.30 <0.30   Lab Results  Component Value Date   WBC 8.1 06/19/2013   HGB 10.7* 06/19/2013    HCT 32.6* 06/19/2013   MCV 92.1 06/19/2013   PLT 266 06/19/2013    Recent Labs Lab 06/18/13 1951 06/19/13 0804  NA 136 139  K 3.6 3.5  CL 97 97  CO2 28 26  BUN 32* 30*  CREATININE 1.48* 1.34*  CALCIUM 9.4 9.9  PROT  7.0  --   BILITOT 0.8  --   ALKPHOS 63  --   ALT 10  --   AST 12  --   GLUCOSE 199* 223*   Lab Results  Component Value Date   CHOL 171 05/27/2010   HDL 38 05/27/2010   LDLCALC 102 05/27/2010   TRIG 154 05/27/2010   Lab Results  Component Value Date   INR 1.78* 06/18/2013   INR 1.05 01/25/2013   INR 1.52* 01/24/2013    Radiology/Studies  Dg Chest Port 1 View  06/19/2013   CLINICAL DATA:  Short of breath  EXAM: PORTABLE CHEST - 1 VIEW  COMPARISON:  06/18/2013  FINDINGS: Cardiomegaly persists. Interstitial edema persists and is worsened. Small amount of accumulating pleural fluid. No other new finding.  IMPRESSION: Continued and worsened congestive heart failure with worsening edema and early pleural fluid accumulation.   Electronically Signed   By: Paulina Fusi M.D.   On: 06/19/2013 08:32   Dg Chest Portable 1 View  06/18/2013   CLINICAL DATA:  Chest pain and shortness of Breath.  EXAM: PORTABLE CHEST - 1 VIEW  COMPARISON:  01/25/2013  FINDINGS: There is moderate enlargement of the cardiopericardial silhouette, which represents a significant change from the prior study.  There is bilateral irregular interstitial thickening and central vascular congestion. No focal lung consolidation. No convincing pleural effusion and no pneumothorax. No mediastinal or hilar masses.  Right anterior chest wall sequential pacemaker is stable and well positioned.  IMPRESSION: Significant enlargement of the cardiopericardial silhouette since the prior study associated with interstitial edema, findings consistent with congestive heart failure.   Electronically Signed   By: Amie Portland M.D.   On: 06/18/2013 19:41   ECG  Afib w/ pod, 124, lbbb.  Afib also noted on ecg from ED  12/27.  ASSESSMENT AND PLAN  1.  Atrial fibrillation with rapid ventricular response: Patient presented to the ED last night and again this morning with worsening heart failure symptoms and evidence of heart failure on chest x-ray. She is also in rapid atrial fibrillation which is new since her cardioversion in February. Suspect this is driving her heart failure symptoms as her weight has been stable at home and though she has some excess volume on exam, she does not appear to be significantly volume overloaded. Her INR was subtherapeutic last night at 1.78. We will admit to stepdown. Cont bb.  Although exact duration of afib unknown and inr subrx, with rapid rates and also sustained VT, we will initiate amiodarone tonight.  Cont coreg, and plan to have her device interrogated in the AM to assess onset of afib.  If it's less than 48 hrs than we can consider DCCV without TEE.  However, if duration is greater than 48 hrs, she will require TEE/DCCV.  2.  Acute on chronic systolic CHF/NICM:  Pt presents with progressive dyspnea/edema/orthopnea/early satiety in the setting of rapid afib.  As above, rate control for now with plan to rhythm control.  Aggressively diurese.  Cont bb, digoxin, acei.  3.  DM:  Hold metformin.  Cont SSI.  4.  HTN:  Stable.  Follow on dilt.  5.  HL:  Cont statin.  6.  Tob Abuse:  Cessation advised.  She says that she can quit anytime.  7.  CKD III:  Follow creat with diuresis.  8.  VT:  supp Mg (1.7) and add amio as above.  Signed, Nicolasa Ducking, NP 06/19/2013, 3:46 PM

## 2013-06-19 NOTE — ED Notes (Signed)
Pt c/o SOB x 3 days and intermittent abd pain.  Denies cough, congestion, or fever.  Denies n/v/d.  LBM was this morning.

## 2013-06-19 NOTE — ED Notes (Signed)
Pt states she feels some better with the oxygen on.

## 2013-06-19 NOTE — ED Notes (Signed)
Patient's heart rate now 140s to highest 159. Dr Adriana Simas made aware. No orders given at this time. Patient closely monitored.

## 2013-06-20 ENCOUNTER — Encounter (HOSPITAL_COMMUNITY): Payer: Self-pay | Admitting: Internal Medicine

## 2013-06-20 ENCOUNTER — Other Ambulatory Visit (INDEPENDENT_AMBULATORY_CARE_PROVIDER_SITE_OTHER): Payer: Self-pay | Admitting: Internal Medicine

## 2013-06-20 ENCOUNTER — Inpatient Hospital Stay (HOSPITAL_COMMUNITY): Payer: Medicare Other | Admitting: Anesthesiology

## 2013-06-20 ENCOUNTER — Encounter (HOSPITAL_COMMUNITY): Payer: Self-pay | Admitting: Certified Registered Nurse Anesthetist

## 2013-06-20 ENCOUNTER — Encounter (HOSPITAL_COMMUNITY)
Admission: EM | Disposition: A | Discharge status: Home / Self Care | Payer: Self-pay | Source: Home / Self Care | Attending: Cardiovascular Disease

## 2013-06-20 ENCOUNTER — Encounter (HOSPITAL_COMMUNITY): Payer: Medicare Other | Admitting: Anesthesiology

## 2013-06-20 DIAGNOSIS — I359 Nonrheumatic aortic valve disorder, unspecified: Secondary | ICD-10-CM

## 2013-06-20 DIAGNOSIS — G8929 Other chronic pain: Secondary | ICD-10-CM

## 2013-06-20 DIAGNOSIS — Z95810 Presence of automatic (implantable) cardiac defibrillator: Secondary | ICD-10-CM

## 2013-06-20 DIAGNOSIS — I509 Heart failure, unspecified: Secondary | ICD-10-CM

## 2013-06-20 DIAGNOSIS — R112 Nausea with vomiting, unspecified: Secondary | ICD-10-CM

## 2013-06-20 DIAGNOSIS — I5023 Acute on chronic systolic (congestive) heart failure: Secondary | ICD-10-CM

## 2013-06-20 DIAGNOSIS — I4891 Unspecified atrial fibrillation: Secondary | ICD-10-CM

## 2013-06-20 HISTORY — PX: CARDIOVERSION: SHX1299

## 2013-06-20 HISTORY — PX: TEE WITHOUT CARDIOVERSION: SHX5443

## 2013-06-20 LAB — TSH: TSH: 1.035 u[IU]/mL (ref 0.350–4.500)

## 2013-06-20 LAB — CBC WITH DIFFERENTIAL/PLATELET
Basophils Absolute: 0 10*3/uL (ref 0.0–0.1)
HCT: 30.2 % — ABNORMAL LOW (ref 36.0–46.0)
Hemoglobin: 9.8 g/dL — ABNORMAL LOW (ref 12.0–15.0)
Lymphocytes Relative: 20 % (ref 12–46)
Lymphs Abs: 1.4 10*3/uL (ref 0.7–4.0)
MCH: 30.2 pg (ref 26.0–34.0)
MCV: 92.9 fL (ref 78.0–100.0)
Monocytes Relative: 11 % (ref 3–12)
Neutro Abs: 5 10*3/uL (ref 1.7–7.7)
Neutrophils Relative %: 69 % (ref 43–77)
RBC: 3.25 MIL/uL — ABNORMAL LOW (ref 3.87–5.11)
RDW: 14.1 % (ref 11.5–15.5)
WBC: 7.2 10*3/uL (ref 4.0–10.5)

## 2013-06-20 LAB — COMPREHENSIVE METABOLIC PANEL
AST: 8 U/L (ref 0–37)
CO2: 27 mEq/L (ref 19–32)
Calcium: 9.2 mg/dL (ref 8.4–10.5)
Chloride: 101 mEq/L (ref 96–112)
Creatinine, Ser: 1.42 mg/dL — ABNORMAL HIGH (ref 0.50–1.10)
GFR calc Af Amer: 43 mL/min — ABNORMAL LOW (ref >90)
GFR calc non Af Amer: 37 mL/min — ABNORMAL LOW (ref >90)
Glucose, Bld: 162 mg/dL — ABNORMAL HIGH (ref 70–99)
Total Protein: 6.3 g/dL (ref 6.0–8.3)

## 2013-06-20 LAB — GLUCOSE, CAPILLARY
Glucose-Capillary: 180 mg/dL — ABNORMAL HIGH (ref 70–99)
Glucose-Capillary: 140 mg/dL — ABNORMAL HIGH (ref 70–99)
Glucose-Capillary: 146 mg/dL — ABNORMAL HIGH (ref 70–99)

## 2013-06-20 LAB — PROTIME-INR: INR: 2.33 — ABNORMAL HIGH (ref 0.00–1.49)

## 2013-06-20 SURGERY — CARDIOVERSION
Anesthesia: Monitor Anesthesia Care

## 2013-06-20 SURGERY — ECHOCARDIOGRAM, TRANSESOPHAGEAL
Anesthesia: General

## 2013-06-20 MED ORDER — ETOMIDATE 2 MG/ML IV SOLN
INTRAVENOUS | Status: DC | PRN
  Administered 2013-06-20: 14 mg via INTRAVENOUS

## 2013-06-20 MED ORDER — WARFARIN SODIUM 2.5 MG PO TABS
2.5000 mg | ORAL_TABLET | Freq: Every day | ORAL | Status: DC
  Administered 2013-06-20 – 2013-06-21 (×2): 2.5 mg via ORAL
  Filled 2013-06-20 (×3): qty 1

## 2013-06-20 MED ORDER — MIDAZOLAM HCL 10 MG/2ML IJ SOLN
INTRAMUSCULAR | Status: DC | PRN
  Administered 2013-06-20: 2 mg via INTRAVENOUS

## 2013-06-20 MED ORDER — BUTAMBEN-TETRACAINE-BENZOCAINE 2-2-14 % EX AERO
INHALATION_SPRAY | CUTANEOUS | Status: DC | PRN
  Administered 2013-06-20: 2 via TOPICAL

## 2013-06-20 MED ORDER — FENTANYL CITRATE 0.05 MG/ML IJ SOLN
INTRAMUSCULAR | Status: DC | PRN
  Administered 2013-06-20: 25 ug via INTRAVENOUS

## 2013-06-20 MED ORDER — MIDAZOLAM HCL 5 MG/ML IJ SOLN
INTRAMUSCULAR | Status: AC
  Filled 2013-06-20: qty 1

## 2013-06-20 MED ORDER — SODIUM CHLORIDE 0.9 % IV SOLN
INTRAVENOUS | Status: DC
  Administered 2013-06-20: 500 mL via INTRAVENOUS

## 2013-06-20 MED ORDER — SODIUM CHLORIDE 0.9 % IV SOLN
INTRAVENOUS | Status: DC | PRN
  Administered 2013-06-20: 15:00:00 via INTRAVENOUS

## 2013-06-20 MED ORDER — FENTANYL CITRATE 0.05 MG/ML IJ SOLN
INTRAMUSCULAR | Status: AC
  Filled 2013-06-20: qty 2

## 2013-06-20 NOTE — Anesthesia Postprocedure Evaluation (Signed)
  Anesthesia Post-op Note  Patient: [name]  Procedure(s) Performed: Procedure(s): TRANSESOPHAGEAL ECHOCARDIOGRAM (TEE) (N/A) CARDIOVERSION (N/A)  Patient Location: PACU and Endoscopy Unit  Anesthesia Type:General  Level of Consciousness: patient cooperative and responds to stimulation  Airway and Oxygen Therapy: Patient Spontanous Breathing and Patient connected to nasal cannula oxygen  Post-op Pain: none  Post-op Assessment: Post-op Vital signs reviewed, Patient's Cardiovascular Status Stable and Respiratory Function Stable  Post-op Vital Signs: Reviewed and stable  Complications: No apparent anesthesia complications

## 2013-06-20 NOTE — Progress Notes (Signed)
 SUBJECTIVE: The patient feels like her breathing is improved today.  She is unable to tell that she is in atrial fibrillation.   Admitted yesterday with shortness of breath and recurrent atrial fibrillation. Last cardioversion Feb 2014. She   Last echo 11-2012 demonstrated an EF of 15-20%, Virtual akinesis of the anteroseptal, anterior,inferoseptal, and apical myocardium. Moderate inferolateralhypokinesis; mild basilar and moderate distal lateral wall hypokinesis; LA 53  She has not tried an AAD.   Device interrogation demonstrates afib for the last 45 days resulting in CRT pacing of 72% with decrease in activity level.  Her Optivol is also increased.  Impedences, thresholds, sensing all within normal limits.   WCT descrtibed as VT but mostly appears as aberration  CURRENT MEDICATIONS: . carvedilol  25 mg Oral BID  . digoxin  0.125 mg Oral Daily  . furosemide  40 mg Intravenous BID  . insulin aspart  0-15 Units Subcutaneous TID WC  . lisinopril  5 mg Oral Daily  . metoCLOPramide  5 mg Oral QID  . potassium chloride  20 mEq Oral BID  . simvastatin  10 mg Oral q1800  . sodium chloride  3 mL Intravenous Q12H  . warfarin  2.5 mg Oral q1800  . Warfarin - Pharmacist Dosing Inpatient   Does not apply q1800   . amiodarone (NEXTERONE PREMIX) 360 mg/200 mL dextrose 30 mg/hr (06/20/13 0752)    OBJECTIVE: Physical Exam: Filed Vitals:   06/20/13 0100 06/20/13 0348 06/20/13 0600 06/20/13 0800  BP: 114/75 119/70 108/58 119/84  Pulse: 81 88 79 91  Temp:  98 F (36.7 C)  98.2 F (36.8 C)  TempSrc:  Oral  Oral  Resp: 16 16 16 14  Height:      Weight:  165 lb 9.1 oz (75.1 kg)    SpO2: 98% 97% 97% 97%    Intake/Output Summary (Last 24 hours) at 06/20/13 0945 Last data filed at 06/19/13 1014  Gross per 24 hour  Intake      0 ml  Output    400 ml  Net   -400 ml    Telemetry reveals atrial fibrillation with ventricular pacing, short runs of NSVT  Physical Exam:  Well developed and  nourished inmild distress HENT normal Neck supple Carotids brisk and full without bruits Clear Irregularly irregular rate and rhythm withrapid ventricular response, no murmurs or gallops Abd-soft with active BS without hepatomegaly No Clubbing cyanosis 1-2+edema Skin-warm and dry A & Oriented  Grossly normal sensory and motor function   LABS: Basic Metabolic Panel:  Recent Labs  06/19/13 0804 06/19/13 0815 06/20/13 0520  NA 139  --  143  K 3.5  --  3.7  CL 97  --  101  CO2 26  --  27  GLUCOSE 223*  --  162*  BUN 30*  --  31*  CREATININE 1.34*  --  1.42*  CALCIUM 9.9  --  9.2  MG  --  1.7 2.2   Liver Function Tests:  Recent Labs  06/18/13 1951 06/20/13 0520  AST 12 8  ALT 10 7  ALKPHOS 63 57  BILITOT 0.8 0.9  PROT 7.0 6.3  ALBUMIN 3.6 3.2*   CBC:  Recent Labs  06/19/13 0804 06/20/13 0520  WBC 8.1 7.2  NEUTROABS 6.4 5.0  HGB 10.7* 9.8*  HCT 32.6* 30.2*  MCV 92.1 92.9  PLT 266 211   Cardiac Enzymes:  Recent Labs  06/18/13 1951 06/19/13 0804  TROPONINI <0.30 <0.30   Thyroid   Function Tests:  Recent Labs  06/19/13 1830  TSH 1.035    RADIOLOGY: Us Abdomen Complete 06/14/2013   CLINICAL DATA:  Epigastric pain.  EXAM: ULTRASOUND ABDOMEN COMPLETE  COMPARISON:  Correlated with chest CT dated 11/02/2005  FINDINGS: Gallbladder:  Multiple polyps identified within the gallbladder. The largest measures 2.6 mm. There is no evidence of gallstones, sliding, gallbladder wall thickening, nor pericholecystic fluid. Gallbladder wall thickness is 1.8 mm. There is no evidence of a sonographic Murphy's sign.  Common bile duct:  Diameter: 3 mm  Liver:  A 0.6 x 0.5 x 0.5 cm hypoechoic well-defined nodule is appreciated within the left lobe of the liver. There is not appear to be appreciable increased through transmission. This finding likely rib represents a benign hemangioma and re-evaluation in 6 months is recommended. Liver is otherwise unremarkable.  IVC:  No  abnormality visualized.  Pancreas:  Pancreatic duct is mildly prominent at 2.6 mm. The pancreas is otherwise unremarkable.  Spleen:  Size and appearance within normal limits.  Right Kidney:  Length: 9 cm. Echogenicity within normal limits. No mass or hydronephrosis visualized.  Left Kidney:  Length: 11.2 cm. Echogenicity within normal limits. No mass or hydronephrosis visualized.  Abdominal aorta:  No aneurysm visualized.  Other findings:  None.  IMPRESSION: Mildly prominent pancreatic duct. The pancreas is otherwise unremarkable. The considering patient's history of epigastric pain correlation pancreatic function tests is recommended. Likely small hemangioma within the left lobe of the liver re-evaluation in 6 months with ultrasound is recommended. Small polyps within the gallbladder   Electronically Signed   By: Hector  Cooper M.D.   On: 06/14/2013 09:06   Dg Chest Port 1 View 06/19/2013   CLINICAL DATA:  Short of breath  EXAM: PORTABLE CHEST - 1 VIEW  COMPARISON:  06/18/2013  FINDINGS: Cardiomegaly persists. Interstitial edema persists and is worsened. Small amount of accumulating pleural fluid. No other new finding.  IMPRESSION: Continued and worsened congestive heart failure with worsening edema and early pleural fluid accumulation.   Electronically Signed   By: Mark  Shogry M.D.   On: 06/19/2013 08:32    ASSESSMENT AND PLAN:  Active Problems:   Cardiomyopathy, nonischemic   Acute on chronic systolic heart failure   Atrial fibrillation Difg level 0.6 INR therapeutic, not sure if 1.7 was false or not as recheck 1-2 hrs lates was >2 Will proceed with TEE DCCV today and continue amio for the maintanence of sinus Will need to follow dig level    

## 2013-06-20 NOTE — Progress Notes (Signed)
Spoke to patient's husband, Ms Markiewicz is inpatient at [facility] and they are not sure when she will  Be discharges=d, CT has been canceled and they will call me once she [place] discharged to get get CT resch;'d

## 2013-06-20 NOTE — Interval H&P Note (Signed)
History and Physical Interval Note:  06/20/2013 2:10 PM  [name]  has presented today for surgery, with the diagnosis of a fib  The various methods of treatment have been discussed with the patient and family. After consideration of risks, benefits and other options for treatment, the patient has consented to  Procedure(s): TRANSESOPHAGEAL ECHOCARDIOGRAM (TEE) (N/A) CARDIOVERSION (N/A) as a surgical intervention .  The patient's history has been reviewed, patient examined, no change in status, stable for surgery.  I have reviewed the patient's chart and labs.  Questions were answered to the patient's satisfaction.     Tobias Alexander, H

## 2013-06-20 NOTE — Progress Notes (Signed)
ANTICOAGULATION CONSULT NOTE   Pharmacy Consult for coumadin Indication: atrial fibrillation  No Known Allergies  Patient Measurements: Height: 5\' 5"  (165.1 cm) Weight: 165 lb 9.1 oz (75.1 kg) IBW/kg (Calculated) : 57 Heparin Dosing Weight: 72 kg  Vital Signs: Temp: 98.2 F (36.8 C) (12/29 0800) Temp src: Oral (12/29 0800) BP: 119/84 mmHg (12/29 0800) Pulse Rate: 91 (12/29 0800)  Labs:  Recent Labs  06/18/13 1951 06/19/13 0804 06/19/13 1830 06/20/13 0520  HGB 9.7* 10.7*  --  9.8*  HCT 30.2* 32.6*  --  30.2*  PLT 229 266  --  211  LABPROT 20.2*  --  23.4* 24.8*  INR 1.78*  --  2.16* 2.33*  CREATININE 1.48* 1.34*  --  1.42*  TROPONINI <0.30 <0.30  --   --     Estimated Creatinine Clearance: 37.9 ml/min (by C-G formula based on Cr of 1.42).   Medical History: Past Medical History  Diagnosis Date  . Cardiomyopathy, nonischemic     a. 1999 nl cath;  b. 12/05 Guidant BiVICD;  c. 10/2005 ICD extraction 2/2 enterococcus bacteremia and Veg on RV lead;  c. 05/2008 low risk Myoview (scarring w/ some evidence of inf ischemia);  d. 11/2012 Echo: EF 15-20%;  e. 01/2013 s/p MDT Coletta Memos CRT D, ser # [id number] H.  . Enterococcal infection     a. 10/2005 - AICD-explanted  . Pulmonary embolism     a. 07/2005 after total right hip arthroplasty  . Hilar density     a. infrahilar mass/adenopathy on CT scan 5/07; subsequently  resolved  . LBBB (left bundle branch block)   . GERD (gastroesophageal reflux disease)   . Hyperlipidemia   . Hypertension   . Tobacco abuse     a. discontinued in 1997, and then resumed  . Urinary incontinence   . Anemia     a. mild/chronic  . Villous adenoma of colon     a. tubovillous adenomatous polyp with focal high grade dysplasia; presented with hematochezia - followed by Dr. Karilyn Cota.  . CKD (chronic kidney disease), stage III     creatinin-1.44 in 1/09; 1.51 in 1/10  . Automatic implantable cardioverter-defibrillator in situ     a.  01/2013 s/p MDT  Coletta Memos CRT D, ser # [id number]  . Chronic systolic CHF (congestive heart failure)     a. 11/2012 Echo: EF 15-20%.  . Pneumonia 07/2005  . Obstructive sleep apnea     a. mild-did not tolerate CPAP (01/24/2013)  . Type II diabetes mellitus   . History of blood transfusion   . Degenerative joint disease     of knees, shoulder, and hips  . PAF (paroxysmal atrial fibrillation)     a. 07/2012 s/p TEE/DCCV;  b. chronic coumadin.  Marland Kitchen NSVT (nonsustained ventricular tachycardia)     Medications:  See med rec  Assessment: Patient is a 67 y.o F on coumadin PTA for afib. She is now in afib with rvr.  Patient stated that her home dose is 2.5mg  daily with last dose taken on 12/26.  INR was 1.78 on 12/27 and up to 2.33  Goal of Therapy:  INR 2-3 Monitor platelets by anticoagulation protocol: Yes   Plan:  1) Coumadin 2.5mg  daily 2)  Daily PT/INR  Keaghan Staton Poteet 06/20/2013,8:53 AM

## 2013-06-20 NOTE — Transfer of Care (Signed)
Immediate Anesthesia Transfer of Care Note  Patient: [name]  Procedure(s) Performed: Procedure(s): TRANSESOPHAGEAL ECHOCARDIOGRAM (TEE) (N/A) CARDIOVERSION (N/A)  Patient Location: PACU and Endoscopy Unit  Anesthesia Type:General  Level of Consciousness: sedated, patient cooperative and responds to stimulation  Airway & Oxygen Therapy: Patient Spontanous Breathing and Patient connected to nasal cannula oxygen  Post-op Assessment: Report given to PACU RN and Post -op Vital signs reviewed and stable  Post vital signs: Reviewed and stable  Complications: No apparent anesthesia complications

## 2013-06-20 NOTE — Progress Notes (Signed)
INITIAL NUTRITION ASSESSMENT  DOCUMENTATION CODES Per approved criteria  -Not Applicable   INTERVENTION: Advance diet as medically appropriate, add interventions accordingly RD to follow for nutrition care plan  NUTRITION DIAGNOSIS: Inadequate oral intake related to inability to eat as evidenced by NPO status  Goal: Pt to meet >/= 90% of their estimated nutrition needs   Monitor:  PO & supplemental intake, weight, labs, I/O's  Reason for Assessment: Malnutrition Screening Tool Report  67 y.o. female  Admitting Dx: progressive dyspnea and A-fibrillation  ASSESSMENT: Patient with PMH of cardiomyopathy, pulmonary embolism, HTN and GERD; presented with increasing dyspnea, mild lover extremity edema, early satiety and intermittent nausea.  Patient currently in ENDOSCOPY; unable to interview patient; per Malnutrition Screening Tool (on admission) patient with recent weight lost without trying and eating poorly because of a decreased appetite; per readings, weight has increased likely due to fluid; RD to monitor PO intake; add supplements as needed.  RD unable to complete Nutrition Focused Physical Exam at this time for possible malnutrition identification.   Height: Ht Readings from Last 1 Encounters:  06/19/13 5\' 5"  (1.651 m)    Weight: Wt Readings from Last 1 Encounters:  06/20/13 165 lb 9.1 oz (75.1 kg)    Ideal Body Weight: 125 lb  % Ideal Body Weight: 132%  Wt Readings from Last 10 Encounters:  06/20/13 165 lb 9.1 oz (75.1 kg)  06/20/13 165 lb 9.1 oz (75.1 kg)  06/20/13 165 lb 9.1 oz (75.1 kg)  06/18/13 160 lb (72.576 kg)  06/10/13 161 lb (73.029 kg)  06/10/13 161 lb (73.029 kg)  01/25/13 161 lb 9.6 oz (73.3 kg)  01/25/13 161 lb 9.6 oz (73.3 kg)  01/05/13 169 lb (76.658 kg)  12/29/12 166 lb (75.297 kg)    Usual Body Weight: 160 lb  % Usual Body Weight: 103%  BMI:  Body mass index is 27.55 kg/(m^2).  Estimated Nutritional Needs: Kcal:  1700-1900 Protein: 80-90 gm Fluid: per MD  Skin: Intact  Diet Order: NPO  EDUCATION NEEDS: -No education needs identified at this time  Labs:   Recent Labs Lab 06/18/13 1951 06/19/13 0804 06/19/13 0815 06/20/13 0520  NA 136 139  --  143  K 3.6 3.5  --  3.7  CL 97 97  --  101  CO2 28 26  --  27  BUN 32* 30*  --  31*  CREATININE 1.48* 1.34*  --  1.42*  CALCIUM 9.4 9.9  --  9.2  MG  --   --  1.7 2.2  GLUCOSE 199* 223*  --  162*    CBG (last 3)   Recent Labs  06/19/13 2043 06/20/13 0747 06/20/13 1223  GLUCAP 158* 180* 140*    Scheduled Meds: . [facility] HOLD] carvedilol  25 mg Oral BID  . [facility] HOLD] digoxin  0.125 mg Oral Daily  . [facility] HOLD] furosemide  40 mg Intravenous BID  . [[facility] HOLD] insulin aspart  0-15 Units Subcutaneous TID WC  . [[facility] HOLD] lisinopril  5 mg Oral Daily  . [facility] HOLD] metoCLOPramide  5 mg Oral QID  . [facility] HOLD] potassium chloride  20 mEq Oral BID  . [facility] HOLD] simvastatin  10 mg Oral q1800  . [[facility] HOLD] sodium chloride  3 mL Intravenous Q12H  . [facility] HOLD] warfarin  2.5 mg Oral q1800  . [facility] HOLD] Warfarin - Pharmacist Dosing Inpatient   Does not apply q1800    Continuous Infusions: . sodium chloride 500 mL (06/20/13 1333)  .  amiodarone (NEXTERONE PREMIX) 360 mg/200 mL dextrose 30 mg/hr (06/20/13 4098)    Past Medical History  Diagnosis Date  . Cardiomyopathy, nonischemic     a. 1999 nl cath;  b. 12/05 Guidant BiVICD;  c. 10/2005 ICD extraction 2/2 enterococcus bacteremia and Veg on RV lead;  c. 05/2008 low risk Myoview (scarring w/ some evidence of inf ischemia);  d. 11/2012 Echo: EF 15-20%;  e. 01/2013 s/p MDT Coletta Memos CRT D, ser # [id number] H.  . Enterococcal infection     a. 10/2005 - AICD-explanted  . Pulmonary embolism     a. 07/2005 after total right hip arthroplasty  . Hilar density     a. infrahilar mass/adenopathy on CT scan 5/07; subsequently  resolved  . LBBB (left bundle branch block)   . GERD (gastroesophageal reflux disease)    . Hyperlipidemia   . Hypertension   . Tobacco abuse     a. discontinued in 1997, and then resumed  . Urinary incontinence   . Anemia     a. mild/chronic  . Villous adenoma of colon     a. tubovillous adenomatous polyp with focal high grade dysplasia; presented with hematochezia - followed by Dr. Karilyn Cota.  . CKD (chronic kidney disease), stage III     creatinin-1.44 in 1/09; 1.51 in 1/10  . Implantable cardioverter-defibrillator-CRT- Mdt     a.  01/2013 s/p MDT Coletta Memos CRT D, ser # [id number]  . Chronic systolic CHF (congestive heart failure)     a. 11/2012 Echo: EF 15-20%.  . Pneumonia 07/2005  . Obstructive sleep apnea     a. mild-did not tolerate CPAP (01/24/2013)  . Type II diabetes mellitus   . History of blood transfusion   . Degenerative joint disease     of knees, shoulder, and hips  . PAF (paroxysmal atrial fibrillation)     a. 07/2012 s/p TEE/DCCV;  b. chronic coumadin.    Past Surgical History  Procedure Laterality Date  . Pacemaker removal  11/18/05    Enterococcal infection  . Total hip arthroplasty Right 07/2005  . Knee arthroscopy Right 1980's?  . Colonoscopy w/ polypectomy  2009  . Cardioversion N/A 08/20/2012    Procedure: TEE GUIDED CARDIOVERSION;  Surgeon: Kathlen Brunswick, MD;  Location: [facility] ORS;  Service: Cardiovascular;  Laterality: N/A;  To be done @ bedside  . Tee without cardioversion N/A 08/20/2012    Procedure: TRANSESOPHAGEAL ECHOCARDIOGRAM (TEE);  Surgeon: Kathlen Brunswick, MD;  Location: [facility] ORS;  Service: Cardiovascular;  Laterality: N/A;  . Bi-ventricular implantable cardioverter defibrillator  (crt-d)  01/24/2013  . A-v cardiac pacemaker insertion  12/05    Biventricular pacemaker/AICD  . Abdominal hysterectomy  1990/92    Initial partial hysterectomy followed by BSO  . Tubal ligation  1980's  . Cardiac catheterization    . Colonoscopy with esophagogastroduodenoscopy (egd) N/A 06/10/2013    Procedure: COLONOSCOPY WITH ESOPHAGOGASTRODUODENOSCOPY (EGD);   Surgeon: Malissa Hippo, MD;  Location: [facility] ENDO SUITE;  Service: Endoscopy;  Laterality: N/A;  925    Maureen Chatters, RD, LDN Pager #: [phone] After-Hours Pager #: [phone]

## 2013-06-20 NOTE — Progress Notes (Signed)
Utilization Review Completed.  

## 2013-06-20 NOTE — H&P (View-Only) (Signed)
SUBJECTIVE: The patient feels like her breathing is improved today.  She is unable to tell that she is in atrial fibrillation.   Admitted yesterday with shortness of breath and recurrent atrial fibrillation. Last cardioversion Feb 2014. She   Last echo 11-2012 demonstrated an EF of 15-20%, Virtual akinesis of the anteroseptal, anterior,inferoseptal, and apical myocardium. Moderate inferolateralhypokinesis; mild basilar and moderate distal lateral wall hypokinesis; LA 53  She has not tried an AAD.   Device interrogation demonstrates afib for the last 45 days resulting in CRT pacing of 72% with decrease in activity level.  Her Optivol is also increased.  Impedences, thresholds, sensing all within normal limits.   WCT descrtibed as VT but mostly appears as aberration  CURRENT MEDICATIONS: . carvedilol  25 mg Oral BID  . digoxin  0.125 mg Oral Daily  . furosemide  40 mg Intravenous BID  . insulin aspart  0-15 Units Subcutaneous TID WC  . lisinopril  5 mg Oral Daily  . metoCLOPramide  5 mg Oral QID  . potassium chloride  20 mEq Oral BID  . simvastatin  10 mg Oral q1800  . sodium chloride  3 mL Intravenous Q12H  . warfarin  2.5 mg Oral q1800  . Warfarin - Pharmacist Dosing Inpatient   Does not apply q1800   . amiodarone (NEXTERONE PREMIX) 360 mg/200 mL dextrose 30 mg/hr (06/20/13 0752)    OBJECTIVE: Physical Exam: Filed Vitals:   06/20/13 0100 06/20/13 0348 06/20/13 0600 06/20/13 0800  BP: 114/75 119/70 108/58 119/84  Pulse: 81 88 79 91  Temp:  98 F (36.7 C)  98.2 F (36.8 C)  TempSrc:  Oral  Oral  Resp: 16 16 16 14   Height:      Weight:  165 lb 9.1 oz (75.1 kg)    SpO2: 98% 97% 97% 97%    Intake/Output Summary (Last 24 hours) at 06/20/13 0945 Last data filed at 06/19/13 1014  Gross per 24 hour  Intake      0 ml  Output    400 ml  Net   -400 ml    Telemetry reveals atrial fibrillation with ventricular pacing, short runs of NSVT  Physical Exam:  Well developed and  nourished inmild distress HENT normal Neck supple Carotids brisk and full without bruits Clear Irregularly irregular rate and rhythm withrapid ventricular response, no murmurs or gallops Abd-soft with active BS without hepatomegaly No Clubbing cyanosis 1-2+edema Skin-warm and dry A & Oriented  Grossly normal sensory and motor function   LABS: Basic Metabolic Panel:  Recent Labs  60/45/40 0804 06/19/13 0815 06/20/13 0520  NA 139  --  143  K 3.5  --  3.7  CL 97  --  101  CO2 26  --  27  GLUCOSE 223*  --  162*  BUN 30*  --  31*  CREATININE 1.34*  --  1.42*  CALCIUM 9.9  --  9.2  MG  --  1.7 2.2   Liver Function Tests:  Recent Labs  06/18/13 1951 06/20/13 0520  AST 12 8  ALT 10 7  ALKPHOS 63 57  BILITOT 0.8 0.9  PROT 7.0 6.3  ALBUMIN 3.6 3.2*   CBC:  Recent Labs  06/19/13 0804 06/20/13 0520  WBC 8.1 7.2  NEUTROABS 6.4 5.0  HGB 10.7* 9.8*  HCT 32.6* 30.2*  MCV 92.1 92.9  PLT 266 211   Cardiac Enzymes:  Recent Labs  06/18/13 1951 06/19/13 0804  TROPONINI <0.30 <0.30   Thyroid  Function Tests:  Recent Labs  06/19/13 1830  TSH 1.035    RADIOLOGY: US Abdomen Complete 06/14/2013   CLINICAL DATA:  Epigastric pain.  EXAM: ULTRASOUND ABDOMEN COMPLETE  COMPARISON:  Correlated with chest CT dated 11/02/2005  FINDINGS: Gallbladder:  Multiple polyps identified within the gallbladder. The largest measures 2.6 mm. There is no evidence of gallstones, sliding, gallbladder wall thickening, nor pericholecystic fluid. Gallbladder wall thickness is 1.8 mm. There is no evidence of a sonographic Murphy's sign.  Common bile duct:  Diameter: 3 mm  Liver:  A 0.6 x 0.5 x 0.5 cm hypoechoic well-defined nodule is appreciated within the left lobe of the liver. There is not appear to be appreciable increased through transmission. This finding likely rib represents a benign hemangioma and re-evaluation in 6 months is recommended. Liver is otherwise unremarkable.  IVC:  No  abnormality visualized.  Pancreas:  Pancreatic duct is mildly prominent at 2.6 mm. The pancreas is otherwise unremarkable.  Spleen:  Size and appearance within normal limits.  Right Kidney:  Length: 9 cm. Echogenicity within normal limits. No mass or hydronephrosis visualized.  Left Kidney:  Length: 11.2 cm. Echogenicity within normal limits. No mass or hydronephrosis visualized.  Abdominal aorta:  No aneurysm visualized.  Other findings:  None.  IMPRESSION: Mildly prominent pancreatic duct. The pancreas is otherwise unremarkable. The considering patient's history of epigastric pain correlation pancreatic function tests is recommended. Likely small hemangioma within the left lobe of the liver re-evaluation in 6 months with ultrasound is recommended. Small polyps within the gallbladder   Electronically Signed   By: Salome Holmes M.D.   On: 06/14/2013 09:06   Dg Chest Port 1 View 06/19/2013   CLINICAL DATA:  Short of breath  EXAM: PORTABLE CHEST - 1 VIEW  COMPARISON:  06/18/2013  FINDINGS: Cardiomegaly persists. Interstitial edema persists and is worsened. Small amount of accumulating pleural fluid. No other new finding.  IMPRESSION: Continued and worsened congestive heart failure with worsening edema and early pleural fluid accumulation.   Electronically Signed   By: Paulina Fusi M.D.   On: 06/19/2013 08:32    ASSESSMENT AND PLAN:  Active Problems:   Cardiomyopathy, nonischemic   Acute on chronic systolic heart failure   Atrial fibrillation Difg level 0.6 INR therapeutic, not sure if 1.7 was false or not as recheck 1-2 hrs lates was >2 Will proceed with TEE DCCV today and continue amio for the maintanence of sinus Will need to follow dig level

## 2013-06-20 NOTE — Anesthesia Preprocedure Evaluation (Addendum)
Anesthesia Evaluation  Patient identified by MRN, date of birth, ID band Patient awake    Reviewed: Allergy & Precautions, H&P , NPO status , Patient's Chart, lab work & pertinent test results, reviewed documented beta blocker date and time   Airway Mallampati: I TM Distance: >3 FB Neck ROM: Full    Dental  (+) Dental Advisory Given and Missing   Pulmonary sleep apnea , Current Smoker,          Cardiovascular hypertension, Pt. on medications and Pt. on home beta blockers +CHF + dysrhythmias Atrial Fibrillation + Cardiac Defibrillator Rhythm:Irregular Rate:Tachycardia     Neuro/Psych    GI/Hepatic GERD-  ,  Endo/Other  diabetes  Renal/GU Renal disease     Musculoskeletal   Abdominal Normal abdominal exam  (+)   Peds  Hematology   Anesthesia Other Findings   Reproductive/Obstetrics                         Anesthesia Physical Anesthesia Plan  ASA: III  Anesthesia Plan: General   Post-op Pain Management:    Induction: Intravenous  Airway Management Planned: Mask  Additional Equipment:   Intra-op Plan:   Post-operative Plan:   Informed Consent: I have reviewed the patients History and Physical, chart, labs and discussed the procedure including the risks, benefits and alternatives for the proposed anesthesia with the patient or authorized representative who has indicated his/her understanding and acceptance.     Plan Discussed with: CRNA and Surgeon  Anesthesia Plan Comments:       Anesthesia Quick Evaluation

## 2013-06-20 NOTE — Care Management Note (Signed)
    Page 1 of 1   06/20/2013     4:08:34 PM   CARE MANAGEMENT NOTE 06/20/2013  Patient:  [name], [name]   Account Number:  [id number]  Date Initiated:  06/20/2013  Documentation initiated by:  Zyanya Glaza  Subjective/Objective Assessment:   adm with AFib w/RVR; lives with her husband, has a cane, walker, wheelchair, and BSC     CM consult      Choice offered to / List presented to:  C-3 Spouse       HH arranged  HH-1 RN  HH-10 DISEASE MANAGEMENT      HH agency  [facility]   Per UR Regulation:  Reviewed for med. necessity/level of care/duration of stay  Comments:  PCP:  Dr John Giovanni  06/20/13 1604 Jalayla Chrismer RN MSN BSN CCM Discussed home health CHF program with pt and spouse, both agree.  Provided list of agencies, referral made per choice.

## 2013-06-20 NOTE — Progress Notes (Signed)
Ok to give coumadin dose today per pharmacy s/p TEE.

## 2013-06-20 NOTE — CV Procedure (Signed)
     Transesophageal Echocardiogram Note  [name] [id number] 03/13/1946  Procedure: Transesophageal Echocardiogram Indications: Atrial fibrillation with RVR  Procedure Details Consent: Obtained Time Out: Verified patient identification, verified procedure, site/side was marked, verified correct patient position, special equipment/implants available, Radiology Safety Procedures followed,  medications/allergies/relevent history reviewed, required imaging and test results available.  Performed  Medications: Fentanyl: 25 mcg Versed: 2 mg  Left Ventrical:  The cavity size was moderately dilated with moderate concentric LVH. LVEF is severely reduced LVEF 15-20%. No apical thrombus.   Mitral Valve: Mild MR. No vegetation.   Aortic Valve: Thickened, trileaflet. Mild AI.   Tricuspid Valve: Mild TR.   Pulmonic Valve: Normal, no PR.  Left Atrium/ Left atrial appendage: Severely dilated, no thrombus in the LA or LAA. Decreased fillimg and emptying velocities.   Atrial septum: No PFO by Doppler.  Pericardium: There is moderate circumferential pericardial effusion with largest diastolic diameter around the lateral wall measuring 2.4 cm. There is no RA or RV collapse. For a full evaluation a TTE should be performed.    Aorta: Minimal AS plague in the visualized portion of the ascending, descending aorta and aortic arch.  Complications: No apparent complications Patient did tolerate procedure well.   Cardioversion Note  Procedure: DC Cardioversion Indications: a-fib with RVR  Procedure Details Consent: Obtained Time Out: Verified patient identification, verified procedure, site/side was marked, verified correct patient position, special equipment/implants available, Radiology Safety Procedures followed,  medications/allergies/relevent history reviewed, required imaging and test results available.  Performed  The patient has been on adequate anticoagulation.  The patient  received 14 mg of IV Etomidate by anesthesiologist for sedation.  Synchronous cardioversion was performed at 150 joules x 1.  The cardioversion was successful  Complications: No apparent complications Patient did tolerate procedure well.   Lars Masson, MD, [facility] 06/20/2013, 2:12 PM

## 2013-06-20 NOTE — Progress Notes (Signed)
  Echocardiogram Echocardiogram Transesophageal has been performed.  [place]ian Co 06/20/2013, 2:38 PM

## 2013-06-20 NOTE — Preoperative (Signed)
Beta Blockers   Reason not to administer Beta Blockers:Not Applicable, given last pm

## 2013-06-21 ENCOUNTER — Encounter (INDEPENDENT_AMBULATORY_CARE_PROVIDER_SITE_OTHER): Payer: Self-pay | Admitting: *Deleted

## 2013-06-21 ENCOUNTER — Encounter (HOSPITAL_COMMUNITY): Payer: Self-pay | Admitting: Cardiology

## 2013-06-21 DIAGNOSIS — I4891 Unspecified atrial fibrillation: Secondary | ICD-10-CM

## 2013-06-21 DIAGNOSIS — I428 Other cardiomyopathies: Secondary | ICD-10-CM

## 2013-06-21 DIAGNOSIS — I5023 Acute on chronic systolic (congestive) heart failure: Secondary | ICD-10-CM

## 2013-06-21 LAB — BASIC METABOLIC PANEL
Calcium: 8.8 mg/dL (ref 8.4–10.5)
Chloride: 101 mEq/L (ref 96–112)
Creatinine, Ser: 1.35 mg/dL — ABNORMAL HIGH (ref 0.50–1.10)
GFR calc Af Amer: 45 mL/min — ABNORMAL LOW (ref >90)
GFR calc non Af Amer: 39 mL/min — ABNORMAL LOW (ref >90)

## 2013-06-21 LAB — GLUCOSE, CAPILLARY: Glucose-Capillary: 151 mg/dL — ABNORMAL HIGH (ref 70–99)

## 2013-06-21 LAB — PROTIME-INR
INR: 2.45 — ABNORMAL HIGH (ref 0.00–1.49)
Prothrombin Time: 25.8 seconds — ABNORMAL HIGH (ref 11.6–15.2)

## 2013-06-21 MED ORDER — FUROSEMIDE 40 MG PO TABS
40.0000 mg | ORAL_TABLET | Freq: Two times a day (BID) | ORAL | Status: DC
  Administered 2013-06-22: 09:00:00 40 mg via ORAL
  Filled 2013-06-21 (×3): qty 1

## 2013-06-21 MED ORDER — AMIODARONE HCL 200 MG PO TABS
400.0000 mg | ORAL_TABLET | Freq: Two times a day (BID) | ORAL | Status: DC
  Administered 2013-06-21 – 2013-06-22 (×2): 400 mg via ORAL
  Filled 2013-06-21 (×3): qty 2

## 2013-06-21 NOTE — Progress Notes (Signed)
Inpatient Diabetes Program Recommendations  AACE/ADA: New Consensus Statement on Inpatient Glycemic Control (2013)  Target Ranges:  Prepandial:   less than 140 mg/dL      Peak postprandial:   less than 180 mg/dL (1-2 hours)      Critically ill patients:  140 - 180 mg/dL   Reason for Visit: Hyperglycemia Results for [name], [name] (MRN [id number]) as of 06/21/2013 08:26  Ref. Range 06/20/2013 12:23 06/20/2013 16:18 06/20/2013 21:57 06/21/2013 07:24 06/21/2013 08:21  Glucose-Capillary Latest Range: 70-99 mg/dL 956 (H) 213 (H) 086 (H) 151 (H) 156 (H)    Inpatient Diabetes Program Recommendations HgbA1C: Check HgbA1C to assess glycemic control prior to hospitalization.  Last one 7.4% on 08/03/2012.  Note: Will continue to follow.  Thank you. Ailene Ards, RD, LDN, CDE Inpatient Diabetes Program [phone]

## 2013-06-21 NOTE — Progress Notes (Signed)
ANTICOAGULATION CONSULT NOTE   Pharmacy Consult for coumadin Indication: atrial fibrillation  No Known Allergies  Patient Measurements: Height: 5\' 5"  (165.1 cm) Weight: 167 lb 5.3 oz (75.9 kg) IBW/kg (Calculated) : 57 Heparin Dosing Weight: 72 kg  Vital Signs: Temp: 97.5 F (36.4 C) (12/30 0800) Temp src: Oral (12/30 0800) BP: 145/73 mmHg (12/30 0800) Pulse Rate: 74 (12/30 0800)  Labs:  Recent Labs  06/18/13 1951 06/19/13 0804 06/19/13 1830 06/20/13 0520 06/21/13 0400  HGB 9.7* 10.7*  --  9.8*  --   HCT 30.2* 32.6*  --  30.2*  --   PLT 229 266  --  211  --   LABPROT 20.2*  --  23.4* 24.8* 25.8*  INR 1.78*  --  2.16* 2.33* 2.45*  CREATININE 1.48* 1.34*  --  1.42* 1.35*  TROPONINI <0.30 <0.30  --   --   --     Estimated Creatinine Clearance: 40.1 ml/min (by C-G formula based on Cr of 1.35).   Medical History: Past Medical History  Diagnosis Date  . Cardiomyopathy, nonischemic     a. 1999 nl cath;  b. 12/05 Guidant BiVICD;  c. 10/2005 ICD extraction 2/2 enterococcus bacteremia and Veg on RV lead;  c. 05/2008 low risk Myoview (scarring w/ some evidence of inf ischemia);  d. 11/2012 Echo: EF 15-20%;  e. 01/2013 s/p MDT Coletta Memos CRT D, ser # [id number] H.  . Enterococcal infection     a. 10/2005 - AICD-explanted  . Pulmonary embolism     a. 07/2005 after total right hip arthroplasty  . Hilar density     a. infrahilar mass/adenopathy on CT scan 5/07; subsequently  resolved  . LBBB (left bundle branch block)   . GERD (gastroesophageal reflux disease)   . Hyperlipidemia   . Hypertension   . Tobacco abuse     a. discontinued in 1997, and then resumed  . Urinary incontinence   . Anemia     a. mild/chronic  . Villous adenoma of colon     a. tubovillous adenomatous polyp with focal high grade dysplasia; presented with hematochezia - followed by Dr. Karilyn Cota.  . CKD (chronic kidney disease), stage III     creatinin-1.44 in 1/09; 1.51 in 1/10  . Implantable  cardioverter-defibrillator-CRT- Mdt     a.  01/2013 s/p MDT Coletta Memos CRT D, ser # [id number]  . Chronic systolic CHF (congestive heart failure)     a. 11/2012 Echo: EF 15-20%.  . Pneumonia 07/2005  . Obstructive sleep apnea     a. mild-did not tolerate CPAP (01/24/2013)  . Type II diabetes mellitus   . History of blood transfusion   . Degenerative joint disease     of knees, shoulder, and hips  . PAF (paroxysmal atrial fibrillation)     a. 07/2012 s/p TEE/DCCV;  b. chronic coumadin.    Medications:  See med rec  Assessment: Patient is a 67 y.o F on coumadin PTA for afib. S? TEE/DCCV  Patient stated that her home dose is 2.5mg  daily with last dose taken on 12/26.  INR was 1.78 on 12/27 and now is 2.45  Goal of Therapy:  INR 2-3 Monitor platelets by anticoagulation protocol: Yes   Plan:  1) Coumadin 2.5mg  daily 2)  Daily PT/INR  [name] 06/21/2013,9:10 AM

## 2013-06-21 NOTE — Progress Notes (Signed)
SUBJECTIVE:     Admitted yesterday with shortness of breath and recurrent atrial fibrillation. Last cardioversion Feb 2014. She underwent TEEDCCV yday and is in sinus today;  Amiodarone initiated   Feels a little better today  Walks at home with a walker but not much out of bed here    Last echo 11-2012 demonstrated an EF of 15-20%, Virtual akinesis of the anteroseptal, anterior,inferoseptal, and apical myocardium. Moderate inferolateralhypokinesis; mild basilar and moderate distal lateral wall hypokinesis; LA 53  .   Device interrogation demonstrates afib for the last 45 days resulting in CRT pacing of 72% with decrease in activity level.  Her Optivol is also increased.  Impedences, thresholds, sensing all within normal limits.   WCT descrtibed as VT but mostly appears as aberration  CURRENT MEDICATIONS: . carvedilol  25 mg Oral BID  . digoxin  0.125 mg Oral Daily  . furosemide  40 mg Intravenous BID  . insulin aspart  0-15 Units Subcutaneous TID WC  . lisinopril  5 mg Oral Daily  . metoCLOPramide  5 mg Oral QID  . potassium chloride  20 mEq Oral BID  . simvastatin  10 mg Oral q1800  . sodium chloride  3 mL Intravenous Q12H  . warfarin  2.5 mg Oral q1800  . Warfarin - Pharmacist Dosing Inpatient   Does not apply q1800   . amiodarone (NEXTERONE PREMIX) 360 mg/200 mL dextrose 30 mg/hr (06/20/13 2259)    OBJECTIVE: Physical Exam: Filed Vitals:   06/21/13 0400 06/21/13 0800 06/21/13 0900 06/21/13 1000  BP: 125/67 145/73 127/70 114/73  Pulse: 74 74 76 72  Temp: 97.6 F (36.4 C) 97.5 F (36.4 C)    TempSrc: Oral Oral    Resp: 12 17 16 16   Height:      Weight: 167 lb 5.3 oz (75.9 kg)     SpO2: 98% 98% 96% 94%    Intake/Output Summary (Last 24 hours) at 06/21/13 1026 Last data filed at 06/21/13 1000  Gross per 24 hour  Intake  943.8 ml  Output      0 ml  Net  943.8 ml    Telemetry reveals atrial fibrillation with ventricular pacing, short runs of NSVT  Physical  Exam: Well developed and nourished in no acute distress HENT normal Neck supple with JVP-flat Clear Regular rate and rhythm, no murmurs or gallops Abd-soft with active BS No Clubbing cyanosis edema Skin-warm and dry A & Oriented  Grossly normal sensory and motor function    LABS: Basic Metabolic Panel:  Recent Labs  16/10/96 0815 06/20/13 0520 06/21/13 0400  NA  --  143 141  K  --  3.7 3.7  CL  --  101 101  CO2  --  27 26  GLUCOSE  --  162* 136*  BUN  --  31* 30*  CREATININE  --  1.42* 1.35*  CALCIUM  --  9.2 8.8  MG 1.7 2.2  --    Liver Function Tests:  Recent Labs  06/18/13 1951 06/20/13 0520  AST 12 8  ALT 10 7  ALKPHOS 63 57  BILITOT 0.8 0.9  PROT 7.0 6.3  ALBUMIN 3.6 3.2*   CBC:  Recent Labs  06/19/13 0804 06/20/13 0520  WBC 8.1 7.2  NEUTROABS 6.4 5.0  HGB 10.7* 9.8*  HCT 32.6* 30.2*  MCV 92.1 92.9  PLT 266 211   Cardiac Enzymes:  Recent Labs  06/18/13 1951 06/19/13 0804  TROPONINI <0.30 <0.30   Thyroid Function Tests:  Recent Labs  06/19/13 1830  TSH 1.035    RADIOLOGY: US Abdomen Complete 06/14/2013   CLINICAL DATA:  Epigastric pain.  EXAM: ULTRASOUND ABDOMEN COMPLETE  COMPARISON:  Correlated with chest CT dated 11/02/2005  FINDINGS: Gallbladder:  Multiple polyps identified within the gallbladder. The largest measures 2.6 mm. There is no evidence of gallstones, sliding, gallbladder wall thickening, nor pericholecystic fluid. Gallbladder wall thickness is 1.8 mm. There is no evidence of a sonographic Murphy's sign.  Common bile duct:  Diameter: 3 mm  Liver:  A 0.6 x 0.5 x 0.5 cm hypoechoic well-defined nodule is appreciated within the left lobe of the liver. There is not appear to be appreciable increased through transmission. This finding likely rib represents a benign hemangioma and re-evaluation in 6 months is recommended. Liver is otherwise unremarkable.  IVC:  No abnormality visualized.  Pancreas:  Pancreatic duct is mildly  prominent at 2.6 mm. The pancreas is otherwise unremarkable.  Spleen:  Size and appearance within normal limits.  Right Kidney:  Length: 9 cm. Echogenicity within normal limits. No mass or hydronephrosis visualized.  Left Kidney:  Length: 11.2 cm. Echogenicity within normal limits. No mass or hydronephrosis visualized.  Abdominal aorta:  No aneurysm visualized.  Other findings:  None.  IMPRESSION: Mildly prominent pancreatic duct. The pancreas is otherwise unremarkable. The considering patient's history of epigastric pain correlation pancreatic function tests is recommended. Likely small hemangioma within the left lobe of the liver re-evaluation in 6 months with ultrasound is recommended. Small polyps within the gallbladder   Electronically Signed   By: Salome Holmes M.D.   On: 06/14/2013 09:06   Dg Chest Port 1 View 06/19/2013   CLINICAL DATA:  Short of breath  EXAM: PORTABLE CHEST - 1 VIEW  COMPARISON:  06/18/2013  FINDINGS: Cardiomegaly persists. Interstitial edema persists and is worsened. Small amount of accumulating pleural fluid. No other new finding.  IMPRESSION: Continued and worsened congestive heart failure with worsening edema and early pleural fluid accumulation.   Electronically Signed   By: Paulina Fusi M.D.   On: 06/19/2013 08:32   Tel  AV pacing  ASSESSMENT AND PLAN:  Active Problems:   Cardiomyopathy, nonischemic   Acute on chronic systolic heart failure   Atrial fibrillation  Continue amio and lasix change IV to PO Transfer to floor PT to ambulate

## 2013-06-22 ENCOUNTER — Telehealth (INDEPENDENT_AMBULATORY_CARE_PROVIDER_SITE_OTHER): Payer: Self-pay | Admitting: *Deleted

## 2013-06-22 ENCOUNTER — Encounter (HOSPITAL_COMMUNITY): Payer: Self-pay | Admitting: Nurse Practitioner

## 2013-06-22 ENCOUNTER — Ambulatory Visit (HOSPITAL_COMMUNITY): Payer: Medicare Other

## 2013-06-22 DIAGNOSIS — Z720 Tobacco use: Secondary | ICD-10-CM

## 2013-06-22 LAB — BASIC METABOLIC PANEL
CO2: 27 mEq/L (ref 19–32)
Chloride: 103 mEq/L (ref 96–112)
Creatinine, Ser: 1.18 mg/dL — ABNORMAL HIGH (ref 0.50–1.10)
GFR calc Af Amer: 53 mL/min — ABNORMAL LOW (ref >90)
Glucose, Bld: 159 mg/dL — ABNORMAL HIGH (ref 70–99)
Sodium: 143 mEq/L (ref 137–147)

## 2013-06-22 LAB — PROTIME-INR
INR: 2.16 — ABNORMAL HIGH (ref 0.00–1.49)
Prothrombin Time: 23.4 seconds — ABNORMAL HIGH (ref 11.6–15.2)

## 2013-06-22 MED ORDER — DIGOXIN 125 MCG PO TABS: 0.0625 mg | ORAL_TABLET | Freq: Every day | ORAL | Status: DC

## 2013-06-22 MED ORDER — AMIODARONE HCL 400 MG PO TABS: ORAL_TABLET | ORAL | Status: DC

## 2013-06-22 MED ORDER — POTASSIUM CHLORIDE CRYS ER 20 MEQ PO TBCR: 20.0000 meq | EXTENDED_RELEASE_TABLET | Freq: Every day | ORAL | Status: DC

## 2013-06-22 MED ORDER — AMIODARONE HCL 200 MG PO TABS: 200.0000 mg | ORAL_TABLET | Freq: Every day | ORAL | Status: DC

## 2013-06-22 MED ORDER — COUMADIN BOOK
Freq: Once | Status: AC
  Administered 2013-06-22: 08:00:00
  Filled 2013-06-22: qty 1

## 2013-06-22 NOTE — Discharge Summary (Signed)
Discharge Summary   Patient ID: [name],  MRN: [id number], DOB/AGE: 02-25-1946 67 y.o.  Admit date: 06/19/2013 Discharge date: 06/22/2013  Primary Care Provider: John Giovanni D Primary Cardiologist: G. Ladona Ridgel, MD   Discharge Diagnoses Principal Problem:   Atrial fibrillation  **S/P TEE/DCCV this admission.  **Amiodarone initiated this admission.  Active Problems:   Cardiomyopathy, nonischemic   Acute on chronic systolic heart failure  **Discharge weight of 157 lbs.   HYPERTENSION   Diabetes mellitus, type 2   Chronic kidney disease   Implantable cardioverter-defibrillator-CRT- Mdt   Tobacco abuse - ongoing   HYPERLIPIDEMIA   LBBB   GERD (gastroesophageal reflux disease)   Anemia, normocytic normochromic   Chronic anticoagulation (coumadin)  Allergies No Known Allergies  Procedures  TEE and DCCV 12.29.2014  Study Conclusions  - Left ventricle: Moderate concentrci LVH. The estimated   ejection fraction was 15%. - Aortic valve: Mild regurgitation. - Mitral valve: Mild regurgitation. - Left atrium: The atrium was dilated. No evidence of   thrombus in the atrial cavity or appendage. No evidence of   thrombus in the appendage. - Right atrium: The atrium was dilated. No evidence of   thrombus in the appendage.  **Successful synchronous cardioversion was performed using 150 Joules x 1. _____________   History of Present Illness  67 year-old female with prior history of nonischemic cardiomyopathy status post biventricular ICD placement.  She also has a h/o paroxysmal atrial fibrillation and underwent TEE and cardioversion in early 2014.  At baseline, she has some degree of chronic dyspnea on exertion and fatigue however this acutely worsened over a 3 day h/o prior to admission.  She was seen in the ED at [facility] on the evening of 12/27 and was felt to be volume overloaded.  She was treated with IV lasix and sent home from the ED.  Unfortunately,  she developed recurrent dyspnea and orthopnea and presented back to [facility] again on 12/28.  Again, she had volume overload and was found to be in Afib with RVR with runs of asymptomatic VT.  She was transferred to [facility] for further evaluation.  Hospital Course  Upon arrival to [facility], she remained in rapid afib with runs of VT. ECG from her ER visit the previous night was reviewed and it was noted that she was also in rapid afib at that time as well.  INR was slightly sub-therapeutic at 1.78 when checked in the ER on 12/27 and as a result, she was placed on IV heparin.  Follow-up INR on 12/28 was therapeutic.   Given how symptomatic she had become, we felt that she would require a rhythm control strategy going forward and as such, we initiated amiodarone therapy.  She would require TEE and cardioversion if she did not convert on amiodarone.  Her ICD was interrogated on 12/29 and it was found that she had been in atrial fibrillation for 45 days.  TEE and cardioversion was successfully carried out on 12/29 and she has maintained sinus rhythm.  She had mild volume overload on exam and has been diuresed down to 157 lbs.  She was transferred out to the floor on 12/30 and has been ambulating without difficulty.  She has been converted to oral amiodarone at 400mg  BID and we will continue this dose for two weeks.  At that point, amiodarone dose will be reduced to 400mg  daily x 2 wks and then further reduced to 200mg  daily.  In the setting of the addition of  amiodarone, her digoxin dose has been reduced.  She will also require close coumadin follow-up within 1 wk, along with outpatient pulmonary function testing.  [name] will be discharged home today in good condition.  Discharge Vitals Blood pressure 135/80, pulse 76, temperature 98.6 F (37 C), temperature source Oral, resp. rate 20, height 5\' 5"  (1.651 m), weight 157 lb 13.6 oz (71.6 kg), SpO2 100.00%.  Filed Weights   06/20/13 0348 06/21/13 0400  06/22/13 0623  Weight: 165 lb 9.1 oz (75.1 kg) 167 lb 5.3 oz (75.9 kg) 157 lb 13.6 oz (71.6 kg)   Labs  CBC  Recent Labs  06/20/13 0520  WBC 7.2  NEUTROABS 5.0  HGB 9.8*  HCT 30.2*  MCV 92.9  PLT 211   Basic Metabolic Panel  Recent Labs  06/20/13 0520 06/21/13 0400 06/22/13 0525  NA 143 141 143  K 3.7 3.7 3.9  CL 101 101 103  CO2 27 26 27   GLUCOSE 162* 136* 159*  BUN 31* 30* 26*  CREATININE 1.42* 1.35* 1.18*  CALCIUM 9.2 8.8 8.9  MG 2.2  --   --    Liver Function Tests  Recent Labs  06/20/13 0520  AST 8  ALT 7  ALKPHOS 57  BILITOT 0.9  PROT 6.3  ALBUMIN 3.2*   Cardiac Enzymes Lab Results  Component Value Date   TROPONINI <0.30 06/19/2013    Thyroid Function Tests  Recent Labs  06/19/13 1830  TSH 1.035   Lab Results  Component Value Date   INR 2.16* 06/22/2013   INR 2.45* 06/21/2013   INR 2.33* 06/20/2013    Disposition  Pt is being discharged home today in good condition.  Follow-up Plans & Appointments  Follow-up Information   Follow up with Joni Reining, NP On 06/29/2013. (2:30 PM)    Specialty:  Nurse Practitioner   Contact information:   [address] [place] [address] [phone]       Follow up with Milana Obey, MD In 1 week. (for follow up INR (coumadin level).)    Specialty:  Family Medicine   Contact information:   [address] [place] [address] [phone]      Discharge Medications    Medication List         amiodarone 400 MG tablet  Commonly known as:  PACERONE  400mg  BID x 2 wks then 400mg  Daily x 2 wks then change to 200mg  daily (additional Rx provided).     amiodarone 200 MG tablet  Commonly known as:  PACERONE  Take 1 tablet (200 mg total) by mouth daily.  Start taking on:  07/21/2013     carvedilol 25 MG tablet  Commonly known as:  COREG  Take 25 mg by mouth 2 (two) times daily.     digoxin 0.125 MG tablet  Commonly known as:  LANOXIN  Take 0.5 tablets  (0.0625 mg total) by mouth daily.     furosemide 80 MG tablet  Commonly known as:  LASIX  Take 40-80 mg by mouth 2 (two) times daily. 1 tab am. 1/2 tab pm     HYDROcodone-acetaminophen 10-325 MG per tablet  Commonly known as:  NORCO  Take 1 tablet by mouth 3 (three) times daily as needed for pain (for pain).     lisinopril 10 MG tablet  Commonly known as:  PRINIVIL,ZESTRIL  Take 0.5 tablets (5 mg total) by mouth daily.     metFORMIN 1000 MG tablet  Commonly known  as:  GLUCOPHAGE  Take 1 tablet (1,000 mg total) by mouth 2 (two) times daily. HOLD for today, then resume usual dose tomorrow, 01/26/2013.     metoCLOPramide 5 MG tablet  Commonly known as:  REGLAN  Take 5 mg by mouth 4 (four) times daily.     oxyCODONE-acetaminophen 10-325 MG per tablet  Commonly known as:  PERCOCET  Take 1 tablet by mouth 4 (four) times daily as needed (for severe pain).     polyethylene glycol powder powder  Commonly known as:  GLYCOLAX/MIRALAX  Take 17 g by mouth daily as needed (constipation).     potassium chloride SA 20 MEQ tablet  Commonly known as:  K-DUR,KLOR-CON  Take 1 tablet (20 mEq total) by mouth daily.     pravastatin 40 MG tablet  Commonly known as:  PRAVACHOL  Take 80 mg by mouth at bedtime.     warfarin 5 MG tablet  Commonly known as:  COUMADIN  Take 2.5 mg by mouth every evening.       Outstanding Labs/Studies  Pt will need PFT's/DLCO as an outpatient given initiation of amiodarone this admission. Follow-up digoxin level at office follow-up given reduction in digoxin dose this admission.  Duration of Discharge Encounter   Greater than 30 minutes including physician time.  Signed, Nicolasa Ducking NP 06/22/2013, 10:07 AM

## 2013-06-22 NOTE — Progress Notes (Addendum)
ELECTROPHYSIOLOGY ROUNDING NOTE    Patient Name: [name] Date of Encounter: 06/22/2013    SUBJECTIVE:Patient feels much improved today.  She did not walk with PT yesterday, but ambulated in the room without difficulty per her report.  Anxious to go home.  Maintaining SR post TEE/DCCV.   BMET pending this morning.  ?accuracy of weight/I/O  TELEMETRY: Reviewed telemetry pt in AV pacing Filed Vitals:   06/21/13 2016 06/21/13 2145 06/22/13 0219 06/22/13 0623  BP:  132/78 136/86 135/80  Pulse:  67 72 76  Temp: 97.8 F (36.6 C) 98.6 F (37 C) 98.9 F (37.2 C) 98.6 F (37 C)  TempSrc: Oral  Oral Oral  Resp:   20 20  Height:      Weight:    157 lb 13.6 oz (71.6 kg)  SpO2:  98% 99% 100%    Intake/Output Summary (Last 24 hours) at 06/22/13 0800 Last data filed at 06/21/13 2140  Gross per 24 hour  Intake 1096.8 ml  Output      0 ml  Net 1096.8 ml    CURRENT MEDICATIONS: . amiodarone  400 mg Oral BID  . carvedilol  25 mg Oral BID  . coumadin book   Does not apply Once  . digoxin  0.125 mg Oral Daily  . furosemide  40 mg Oral BID  . insulin aspart  0-15 Units Subcutaneous TID WC  . lisinopril  5 mg Oral Daily  . metoCLOPramide  5 mg Oral QID  . potassium chloride  20 mEq Oral BID  . simvastatin  10 mg Oral q1800  . sodium chloride  3 mL Intravenous Q12H  . warfarin  2.5 mg Oral q1800  . Warfarin - Pharmacist Dosing Inpatient   Does not apply q1800    LABS: Basic Metabolic Panel:  Recent Labs  84/69/62 0815 06/20/13 0520 06/21/13 0400 06/22/13 0525  NA  --  143 141 143  K  --  3.7 3.7 3.9  CL  --  101 101 103  CO2  --  27 26 27   GLUCOSE  --  162* 136* 159*  BUN  --  31* 30* 26*  CREATININE  --  1.42* 1.35* 1.18*  CALCIUM  --  9.2 8.8 8.9  MG 1.7 2.2  --   --    Liver Function Tests:  Recent Labs  06/20/13 0520  AST 8  ALT 7  ALKPHOS 57  BILITOT 0.9  PROT 6.3  ALBUMIN 3.2*   CBC:  Recent Labs  06/19/13 0804 06/20/13 0520  WBC 8.1 7.2   NEUTROABS 6.4 5.0  HGB 10.7* 9.8*  HCT 32.6* 30.2*  MCV 92.1 92.9  PLT 266 211   Cardiac Enzymes:  Recent Labs  06/19/13 0804  TROPONINI <0.30   Thyroid Function Tests:  Recent Labs  06/19/13 1830  TSH 1.035   INR: 2.16  Radiology/Studies:  US Abdomen Complete 06/14/2013   CLINICAL DATA:  Epigastric pain.  EXAM: ULTRASOUND ABDOMEN COMPLETE  COMPARISON:  Correlated with chest CT dated 11/02/2005  FINDINGS: Gallbladder:  Multiple polyps identified within the gallbladder. The largest measures 2.6 mm. There is no evidence of gallstones, sliding, gallbladder wall thickening, nor pericholecystic fluid. Gallbladder wall thickness is 1.8 mm. There is no evidence of a sonographic Murphy's sign.  Common bile duct:  Diameter: 3 mm  Liver:  A 0.6 x 0.5 x 0.5 cm hypoechoic well-defined nodule is appreciated within the left lobe of the liver. There is not appear to  be appreciable increased through transmission. This finding likely rib represents a benign hemangioma and re-evaluation in 6 months is recommended. Liver is otherwise unremarkable.  IVC:  No abnormality visualized.  Pancreas:  Pancreatic duct is mildly prominent at 2.6 mm. The pancreas is otherwise unremarkable.  Spleen:  Size and appearance within normal limits.  Right Kidney:  Length: 9 cm. Echogenicity within normal limits. No mass or hydronephrosis visualized.  Left Kidney:  Length: 11.2 cm. Echogenicity within normal limits. No mass or hydronephrosis visualized.  Abdominal aorta:  No aneurysm visualized.  Other findings:  None.  IMPRESSION: Mildly prominent pancreatic duct. The pancreas is otherwise unremarkable. The considering patient's history of epigastric pain correlation pancreatic function tests is recommended. Likely small hemangioma within the left lobe of the liver re-evaluation in 6 months with ultrasound is recommended. Small polyps within the gallbladder   Electronically Signed   By: Salome Holmes M.D.   On: 06/14/2013 09:06    Dg Chest Port 1 View 06/19/2013   CLINICAL DATA:  Short of breath  EXAM: PORTABLE CHEST - 1 VIEW  COMPARISON:  06/18/2013  FINDINGS: Cardiomegaly persists. Interstitial edema persists and is worsened. Small amount of accumulating pleural fluid. No other new finding.  IMPRESSION: Continued and worsened congestive heart failure with worsening edema and early pleural fluid accumulation.   Electronically Signed   By: Paulina Fusi M.D.   On: 06/19/2013 08:32   Dg Chest Portable 1 View 06/18/2013   CLINICAL DATA:  Chest pain and shortness of Breath.  EXAM: PORTABLE CHEST - 1 VIEW  COMPARISON:  01/25/2013  FINDINGS: There is moderate enlargement of the cardiopericardial silhouette, which represents a significant change from the prior study.  There is bilateral irregular interstitial thickening and central vascular congestion. No focal lung consolidation. No convincing pleural effusion and no pneumothorax. No mediastinal or hilar masses.  Right anterior chest wall sequential pacemaker is stable and well positioned.  IMPRESSION: Significant enlargement of the cardiopericardial silhouette since the prior study associated with interstitial edema, findings consistent with congestive heart failure.   Electronically Signed   By: Amie Portland M.D.   On: 06/18/2013 19:41    PHYSICAL EXAM BP 135/80  Pulse 76  Temp(Src) 98.6 F (37 C) (Oral)  Resp 20  Ht 5\' 5"  (1.651 m)  Wt 157 lb 13.6 oz (71.6 kg)  BMI 26.27 kg/m2  SpO2 100%  Well developed and nourished in no acute distress HENT normal Neck supple   Clear Regular rate and rhythm,2/6 mur Abd-soft with active BS No Clubbing cyanosis edema Skin-warm and dry A & Oriented  Grossly normal sensory and motor function      Active Problems:   Cardiomyopathy, nonischemic   Acute on chronic systolic heart failure   Atrial fibrillation   Improved CHF  D/C on amio 400 bid x 14d 400 qd x 2 weeks  F/u GT/PA Gideon 3 weeks  Will need PFTs with DLCO   Maybe can be done that same day Decrease dig to 0.0625 and anticipate dig level at followup

## 2013-06-22 NOTE — Progress Notes (Signed)
ANTICOAGULATION CONSULT NOTE   Pharmacy Consult for warfarin Indication: atrial fibrillation  No Known Allergies  Patient Measurements: Height: 5\' 5"  (165.1 cm) Weight: 157 lb 13.6 oz (71.6 kg) IBW/kg (Calculated) : 57  Vital Signs: Temp: 98.6 F (37 C) (12/31 0623) Temp src: Oral (12/31 0623) BP: 135/80 mmHg (12/31 0623) Pulse Rate: 76 (12/31 0623)  Labs:  Recent Labs  06/19/13 0804  06/20/13 0520 06/21/13 0400 06/22/13 0525  HGB 10.7*  --  9.8*  --   --   HCT 32.6*  --  30.2*  --   --   PLT 266  --  211  --   --   LABPROT  --   < > 24.8* 25.8* 23.4*  INR  --   < > 2.33* 2.45* 2.16*  CREATININE 1.34*  --  1.42* 1.35* 1.18*  TROPONINI <0.30  --   --   --   --   < > = values in this interval not displayed.  Estimated Creatinine Clearance: 44.6 ml/min (by C-G formula based on Cr of 1.18).  Assessment: [name] continuing on warfarin. She is s/p DCCV. Have continued patient's home dose of 2.5mg  daily and INR has remained therapeutic- 2.16 this morning. Hgb has dropped slightly, platelets WNL. No overt bleeding noted.  Goal of Therapy:  INR 2-3 Monitor platelets by anticoagulation protocol: Yes   Plan:  1. Continue warfarin 2.5mg  daily as ordered 2. Daily PT/INR  Terrace Fontanilla D. Jaiyana Canale, PharmD, BCPS Clinical Pharmacist Pager: [phone] 06/22/2013 7:51 AM

## 2013-06-22 NOTE — Evaluation (Signed)
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   Clinical Impression   Patient evaluated by Occupational Therapy with no further acute OT needs identified. All education has been completed and the patient has no further questions. See below for any follow-up Occupational Therapy or equipment needs. OT to sign off. Thank you for referral.      OT Assessment  Patient does not need any further OT services    Follow Up Recommendations  No OT follow up (declines HHOT)    Barriers to Discharge      Equipment Recommendations  None recommended by OT    Recommendations for Other Services    Frequency       Precautions / Restrictions Precautions Precautions: None Restrictions Weight Bearing Restrictions: No   Pertinent Vitals/Pain Monitored and stable    ADL  Eating/Feeding: Independent Where Assessed - Eating/Feeding: Edge of bed Grooming: Wash/dry hands;Modified independent Where Assessed - Grooming: Unsupported standing Lower Body Dressing:  (husband and AE use at baseline) Toilet Transfer: Modified independent Toilet Transfer Method: Sit to Barista: Regular height toilet Equipment Used: Gait belt;Rolling walker Transfers/Ambulation Related to ADLs: Pt ambulating with rollator mod I. pt is at baseline for ambulation functionally but has decr activity tolerance ADL Comments: Pt is able to complete ADLs at baseline level of functional ability howevver requires incr time due to decr activity tolerance. Pt will have spouse (A) at home    OT Diagnosis:    OT Problem List:   OT  Treatment Interventions:     OT Goals(Current goals can be found in the care plan section)    Visit Information  Last OT Received On: 06/22/13 Assistance Needed: +1 History of Present Illness: 67 y/o female with a long h/o NICM s/p bi-V ICD in August who was also dx with PAF in February (s/p DCCV) who presented back to the ER last night and again with progressive dyspnea and afib.       Prior Functioning     Home Living Family/patient expects to be discharged to:: Private residence Living Arrangements: Spouse/significant other Available Help at Discharge: Family Type of Home: House Home Access: Stairs to enter Secretary/administrator of Steps: 6 Entrance Stairs-Rails: Can reach both Home Layout: One level Home Equipment: Walker - 4 wheels;Cane - single point;Bedside commode;Shower seat;Wheelchair - manual;Electric scooter;Hand held shower head;Adaptive equipment (x2 lift chairs) Adaptive Equipment: Reacher;Sock aid Prior Function Level of Independence: Independent with assistive device(s) Communication Communication: No difficulties Dominant Hand: Right         Vision/Perception Vision - History Baseline Vision: No visual deficits Patient Visual Report: No change from baseline Vision - Assessment Vision Assessment: Vision not tested   Cognition  Cognition Arousal/Alertness: Awake/alert Behavior During Therapy: WFL for tasks assessed/performed Overall Cognitive Status: Within Functional Limits for tasks assessed    Extremity/Trunk Assessment Upper Extremity Assessment Upper Extremity Assessment: Overall WFL for tasks assessed Lower Extremity Assessment Lower Extremity Assessment: Defer to PT evaluation (LT Hip deficits)     Mobility Bed Mobility Bed Mobility: Supine to Sit;Sitting - Scoot to Edge of Bed;Sit to Supine Supine to Sit:  3: Mod assist;With rails;HOB elevated Sitting - Scoot to Edge of Bed: 4: Min guard Sit to Supine: 4: Min assist ((A) with Lt  LE) Details for Bed Mobility Assistance: Pt reports hip replacement and requires (A) with bed mobility. Pt and spouse report pt needs Rt hip replacement but unable to have procedure due to cardiac hx Transfers Transfers: Sit to Stand;Stand to Sit Sit to Stand: With upper extremity assist;From bed;5: Supervision Stand to Sit: 5: Supervision;With upper extremity assist;To bed     Exercise     Balance     End of Session OT - End of Session Activity Tolerance: Patient tolerated treatment well Patient left: in bed;with call bell/phone within reach Nurse Communication: Mobility status;Precautions  GO     Harolyn Rutherford 06/22/2013, 10:42 AM Pager: [phone]

## 2013-06-22 NOTE — Telephone Encounter (Signed)
Patient was discharged from [facility] today and CT wasn't done while inpatient -- does she still need CT -- please advise

## 2013-06-22 NOTE — Progress Notes (Signed)
Pt is being discharged home. Pt is being transported home by her husband. Pt has been provided with discharge instructions. RN went over discharge instructions with the patient and answered all questions.

## 2013-06-22 NOTE — Evaluation (Signed)
Physical Therapy Evaluation Patient Details Name: [name] MRN: [id number] DOB: Nov 15, 1945 Today's Date: 06/22/2013 Time: 1008-1040 PT Time Calculation (min): 32 min  PT Assessment / Plan / Recommendation History of Present Illness  67 y/o female with a long h/o NICM s/p bi-V ICD in August who was also dx with PAF in February (s/p DCCV) who presented back to the ER last night and again with progressive dyspnea and afib.  Clinical Impression  Patient evaluated by Physical Therapy with no further acute PT needs identified, as pt is to dc today. All education has been completed and the patient has no further questions.  See below for any follow-up Physical Therapy or equipment needs. PT is signing off. Thank you for this referral.      PT Assessment  All further PT needs can be met in the next venue of care    Follow Up Recommendations  Home health PT;Supervision/Assistance - 24 hour    Does the patient have the potential to tolerate intense rehabilitation      Barriers to Discharge        Equipment Recommendations  None recommended by PT    Recommendations for Other Services     Frequency      Precautions / Restrictions Precautions Precautions: None Restrictions Weight Bearing Restrictions: No   Pertinent Vitals/Pain Mild dyspnea, otherwise no apparent distress       Mobility  Bed Mobility Bed Mobility: Supine to Sit;Sitting - Scoot to Edge of Bed Supine to Sit: 4: Min assist;HOB flat Sitting - Scoot to Edge of Bed: 4: Min guard Sit to Supine:  ((A) with Lt LE) Details for Bed Mobility Assistance: Pt reports hip replacement and requires (A) with bed mobility. Pt and spouse report pt needs Rt hip replacement but unable to have procedure due to cardiac hx Transfers Transfers: Sit to Stand;Stand to Sit Sit to Stand: With upper extremity assist;From bed;5: Supervision Stand to Sit: 5: Supervision;With upper extremity assist;To bed Details for Transfer Assistance:  Cues for hand placement; Dependent on UE push for successful sit to stand Ambulation/Gait Ambulation/Gait Assistance: 4: Min guard;5: Supervision Ambulation Distance (Feet): 120 Feet Assistive device: 4-wheeled walker Ambulation/Gait Assistance Details: minguard progressing to supervision; Noted gait asymmetry, with L hip weakness, but no loss of balance; Cues to self-monitor for activity tolerance Gait Pattern: Step-through pattern Stairs: Yes Stairs Assistance: 4: Min guard Stair Management Technique: One rail Left;Step to pattern Number of Stairs: 6 (Cues for sequence)    Exercises     PT Diagnosis: Difficulty walking  PT Problem List: Decreased strength;Decreased range of motion;Decreased activity tolerance;Decreased mobility;Decreased knowledge of use of DME;Cardiopulmonary status limiting activity PT Treatment Interventions:       PT Goals(Current goals can be found in the care plan section) Acute Rehab PT Goals Patient Stated Goal: home today PT Goal Formulation: No goals set, d/c therapy  Visit Information  Last PT Received On: 06/22/13 Assistance Needed: +1 History of Present Illness: 67 y/o female with a long h/o NICM s/p bi-V ICD in August who was also dx with PAF in February (s/p DCCV) who presented back to the ER last night and again with progressive dyspnea and afib.       Prior Functioning  Home Living Family/patient expects to be discharged to:: Private residence Living Arrangements: Spouse/significant other Available Help at Discharge: Family Type of Home: House Home Access: Stairs to enter Secretary/administrator of Steps: 6 Entrance Stairs-Rails: Can reach both Home Layout: One level Home Equipment:  Walker - 4 wheels;Cane - single point;Bedside commode;Shower seat;Wheelchair - manual;Electric scooter;Hand held shower head;Adaptive equipment (x2 lift chairs) Adaptive Equipment: Reacher;Sock aid Prior Function Level of Independence: Independent with  assistive device(s) Communication Communication: No difficulties Dominant Hand: Right    Cognition  Cognition Arousal/Alertness: Awake/alert Behavior During Therapy: WFL for tasks assessed/performed Overall Cognitive Status: Within Functional Limits for tasks assessed    Extremity/Trunk Assessment Upper Extremity Assessment Upper Extremity Assessment: Overall WFL for tasks assessed Lower Extremity Assessment Lower Extremity Assessment: Overall WFL for tasks assessed (though noted L hip instability)   Balance    End of Session PT - End of Session Equipment Utilized During Treatment: Gait belt Activity Tolerance: Patient tolerated treatment well Patient left: with family/visitor present;Other (comment) (standing at sink to brush hair)  GP     Marylu Lund [facility] [place], [place] [phone]  06/22/2013, 10:54 AM

## 2013-06-27 NOTE — Telephone Encounter (Signed)
Can schedule abdominopelvic CT with contrast. Reason his upper abdominal pain anorexia and weight loss

## 2013-06-27 NOTE — Telephone Encounter (Signed)
CT sch'd 07/04/13 at 830, patient aware

## 2013-06-29 ENCOUNTER — Encounter: Payer: Self-pay | Admitting: Adult Health

## 2013-06-29 ENCOUNTER — Ambulatory Visit (INDEPENDENT_AMBULATORY_CARE_PROVIDER_SITE_OTHER): Payer: Medicare HMO | Admitting: Adult Health

## 2013-06-29 VITALS — BP 128/66 | HR 70 | Ht 65.0 in | Wt 153.2 lb

## 2013-06-29 DIAGNOSIS — I10 Essential (primary) hypertension: Secondary | ICD-10-CM

## 2013-06-29 DIAGNOSIS — Z95810 Presence of automatic (implantable) cardiac defibrillator: Secondary | ICD-10-CM

## 2013-06-29 DIAGNOSIS — I4891 Unspecified atrial fibrillation: Secondary | ICD-10-CM

## 2013-06-29 NOTE — Progress Notes (Deleted)
Name: [name]    DOB: 1945/11/27  Age: 68 y.o.  MR#: [id number]       PCP:  Robert Bellow, MD      Insurance: Payor: [employer] MEDICARE / Plan: [place] HMO / Product Type: *No Product type* /   CC:    Chief Complaint  Patient presents with  . Atrial Fibrillation  . Hypertension   PT CURRENTLY STILL TAKING AMIODARONE 400MG  TWICE DAILY AND WILL SWITCH TO 400MG  ONCE DAILY ON THE 14TH VS Filed Vitals:   06/29/13 1421  BP: 128/66  Pulse: 70  Height: 5\' 5"  (1.651 m)  Weight: 153 lb 4 oz (69.514 kg)    Weights Current Weight  06/29/13 153 lb 4 oz (69.514 kg)  06/22/13 157 lb 13.6 oz (71.6 kg)  06/22/13 157 lb 13.6 oz (71.6 kg)    Blood Pressure  BP Readings from Last 3 Encounters:  06/29/13 128/66  06/22/13 135/80  06/22/13 135/80     Admit date:  (Not on file) Last encounter with RMR:  Visit date not found   Allergy Review of patient's allergies indicates no known allergies.  Current Outpatient Prescriptions  Medication Sig Dispense Refill  . amiodarone (PACERONE) 400 MG tablet 400mg  BID x 2 wks then 400mg  Daily x 2 wks then change to 200mg  daily (additional Rx provided).  42 tablet  0  . carvedilol (COREG) 25 MG tablet Take 25 mg by mouth 2 (two) times daily.        . digoxin (LANOXIN) 0.125 MG tablet Take 0.5 tablets (0.0625 mg total) by mouth daily.  30 tablet  3  . furosemide (LASIX) 80 MG tablet Take 80 mg by mouth 2 (two) times daily.       Marland Kitchen lisinopril (PRINIVIL,ZESTRIL) 10 MG tablet Take 0.5 tablets (5 mg total) by mouth daily.  30 tablet  6  . metFORMIN (GLUCOPHAGE) 1000 MG tablet Take 1 tablet (1,000 mg total) by mouth 2 (two) times daily. HOLD for today, then resume usual dose tomorrow, 01/26/2013.      . metoCLOPramide (REGLAN) 5 MG tablet Take 5 mg by mouth 4 (four) times daily.      Marland Kitchen oxyCODONE-acetaminophen (PERCOCET) 10-325 MG per tablet Take 1 tablet by mouth 4 (four) times daily as needed (for severe pain).       . polyethylene glycol powder  (GLYCOLAX/MIRALAX) powder Take 17 g by mouth daily as needed (constipation).       . potassium chloride SA (K-DUR,KLOR-CON) 20 MEQ tablet Take 1 tablet (20 mEq total) by mouth daily.  30 tablet  6  . pravastatin (PRAVACHOL) 40 MG tablet Take 80 mg by mouth at bedtime.       Marland Kitchen warfarin (COUMADIN) 5 MG tablet Take 2.5 mg by mouth every evening.      Derrill Memo ON 07/21/2013] amiodarone (PACERONE) 200 MG tablet Take 1 tablet (200 mg total) by mouth daily.  30 tablet  6  . HYDROcodone-acetaminophen (NORCO) 10-325 MG per tablet Take 1 tablet by mouth 3 (three) times daily as needed for pain (for pain).       No current facility-administered medications for this visit.    Discontinued Meds:   There are no discontinued medications.  Patient Active Problem List   Diagnosis Date Noted  . Chronic anticoagulation 08/19/2012    Priority: Medium  . Tobacco abuse 06/22/2013  . Implantable cardioverter-defibrillator-CRT- Mdt   . Acute on chronic systolic heart failure [id number]  . Atrial fibrillation 06/19/2013  .  Chronic systolic congestive heart failure, NYHA class 3 08/03/2012  . Cardiomyopathy, nonischemic   . Pulmonary embolism   . Diabetes mellitus, type 2   . GERD (gastroesophageal reflux disease)   . Tobacco abuse, in remission   . Anemia, normocytic normochromic   . Obstructive sleep apnea   . Degenerative joint disease   . Chronic kidney disease   . LBBB 01/29/2009  . HYPERLIPIDEMIA 06/04/2007  . HYPERTENSION 06/04/2007    LABS    Component Value Date/Time   NA 143 06/22/2013 0525   NA 141 06/21/2013 0400   NA 143 06/20/2013 0520   K 3.9 06/22/2013 0525   K 3.7 06/21/2013 0400   K 3.7 06/20/2013 0520   CL 103 06/22/2013 0525   CL 101 06/21/2013 0400   CL 101 06/20/2013 0520   CO2 27 06/22/2013 0525   CO2 26 06/21/2013 0400   CO2 27 06/20/2013 0520   GLUCOSE 159* 06/22/2013 0525   GLUCOSE 136* 06/21/2013 0400   GLUCOSE 162* 06/20/2013 0520   BUN 26* 06/22/2013 0525    BUN 30* 06/21/2013 0400   BUN 31* 06/20/2013 0520   CREATININE 1.18* 06/22/2013 0525   CREATININE 1.35* 06/21/2013 0400   CREATININE 1.42* 06/20/2013 0520   CREATININE 1.17* 01/20/2013 1010   CREATININE 1.56* 08/25/2012 0915   CREATININE 1.20* 07/22/2011 1011   CALCIUM 8.9 06/22/2013 0525   CALCIUM 8.8 06/21/2013 0400   CALCIUM 9.2 06/20/2013 0520   GFRNONAA 46* 06/22/2013 0525   GFRNONAA 39* 06/21/2013 0400   GFRNONAA 37* 06/20/2013 0520   GFRAA 53* 06/22/2013 0525   GFRAA 45* 06/21/2013 0400   GFRAA 43* 06/20/2013 0520   CMP     Component Value Date/Time   NA 143 06/22/2013 0525   K 3.9 06/22/2013 0525   CL 103 06/22/2013 0525   CO2 27 06/22/2013 0525   GLUCOSE 159* 06/22/2013 0525   BUN 26* 06/22/2013 0525   CREATININE 1.18* 06/22/2013 0525   CREATININE 1.17* 01/20/2013 1010   CALCIUM 8.9 06/22/2013 0525   PROT 6.3 06/20/2013 0520   ALBUMIN 3.2* 06/20/2013 0520   AST 8 06/20/2013 0520   ALT 7 06/20/2013 0520   ALKPHOS 57 06/20/2013 0520   BILITOT 0.9 06/20/2013 0520   GFRNONAA 46* 06/22/2013 0525   GFRAA 53* 06/22/2013 0525       Component Value Date/Time   WBC 7.2 06/20/2013 0520   WBC 8.1 06/19/2013 0804   WBC 6.6 06/18/2013 1951   HGB 9.8* 06/20/2013 0520   HGB 10.7* 06/19/2013 0804   HGB 9.7* 06/18/2013 1951   HCT 30.2* 06/20/2013 0520   HCT 32.6* 06/19/2013 0804   HCT 30.2* 06/18/2013 1951   MCV 92.9 06/20/2013 0520   MCV 92.1 06/19/2013 0804   MCV 93.2 06/18/2013 1951    Lipid Panel     Component Value Date/Time   CHOL 171 05/27/2010   TRIG 154 05/27/2010   HDL 38 05/27/2010   CHOLHDL 7.7 Ratio 07/16/2007 0000   VLDL 64* 07/16/2007 0000   LDLCALC 102 05/27/2010    ABG    Component Value Date/Time   PHART 7.237* 04/09/2008 2015   PCO2ART 43.7 04/09/2008 2015   PO2ART 315.0* 04/09/2008 2015   HCO3 17.9* 04/09/2008 2015   TCO2 21 04/09/2008 2037   ACIDBASEDEF 8.1* 04/09/2008 2015   O2SAT 99.5 04/09/2008 2015     Lab Results  Component Value  Date   TSH 1.035 06/19/2013   BNP (last 3 results)  Recent Labs  08/02/12 2151 06/18/13 1951 06/19/13 0804  PROBNP 27771.0* 18063.0* [id number]*   Cardiac Panel (last 3 results) No results found for this basename: CKTOTAL, CKMB, TROPONINI, RELINDX,  in the last 72 hours  Iron/TIBC/Ferritin    Component Value Date/Time   IRON 51 07/22/2011 1011   TIBC 294 07/22/2011 1011   FERRITIN 71 07/22/2011 1011     EKG Orders placed during the hospital encounter of 06/19/13  . ED EKG  . EKG 12-LEAD  . EKG 12-LEAD  . EKG 12-LEAD  . EKG 12-LEAD  . EKG 12-LEAD  . EKG 12-LEAD  . EKG     Prior Assessment and Plan Problem List as of 06/29/2013   Chronic anticoagulation   HYPERLIPIDEMIA   Last Assessment & Plan   07/14/2011 Office Visit Written 07/14/2011 12:59 PM by Yehuda Savannah, MD     Most recent lipid profile in 11/2010 showed very adequate values, especially in light of the absence of known vascular disease.    HYPERTENSION   Last Assessment & Plan   12/29/2012 Office Visit Written 12/29/2012  4:56 PM by Lendon Colonel, NP     She is controlled but not optimal with severe systolic dysfunction. Would increase lisinopril to 20 mg daily. She had not taken lasix today due to appt. Will take it when she gets home.     LBBB   Cardiomyopathy, nonischemic   Last Assessment & Plan   12/29/2012 Office Visit Written 12/29/2012  4:54 PM by Lendon Colonel, NP     She remains on optimal medical management with ACE, BB, diuretic, and digoxin. Her systolic function has worsened from 20% to 10-15% per echo in 11/2012. I am going to refer her to Dr. Lovena Le for discussion of possibility of ICD implantation, despite prior history of infective endocarditis in 2007 in the setting of septic emboli after knee effusion aspiration at that time.  I have discussed with her the likelihood of sudden cardiac death with continued severe systolic dysfunction. She would like to talk to Dr. Lovena Le about possible  implantation.Will defer to Dr.Taylor for his recommendations.     Pulmonary embolism   Last Assessment & Plan   01/14/2011 Office Visit Written 01/14/2011  8:17 PM by Yehuda Savannah, MD     She continues to remain free of thromboembolic disease without requiring full anticoagulation.    Diabetes mellitus, type 2   Last Assessment & Plan   05/10/2012 Office Visit Written 05/10/2012 12:22 PM by Yehuda Savannah, MD     Patient reports CBGs less than 150, but is unaware of any recent A1c determination.  Most recent value available to me was 6.8 in 11/2010, indicating good control at that time.    GERD (gastroesophageal reflux disease)   Tobacco abuse, in remission   Last Assessment & Plan   07/14/2011 Office Visit Written 07/14/2011  1:01 PM by Yehuda Savannah, MD     Patient reports minimal cigarette smoking since 2010.  She is encouraged to avoid tobacco use entirely.    Anemia, normocytic normochromic   Last Assessment & Plan   09/20/2012 Office Visit Written 09/20/2012  2:03 PM by Lendon Colonel, NP     Recent labs have been completed within the last month she remains stable.    Obstructive sleep apnea   Degenerative joint disease   Last Assessment & Plan   07/14/2011 Office Visit Edited 07/18/2011  7:12 PM by Yehuda Savannah, MD     Degenerative  joint disease of the hip continues to significantly impair her quality of life, but Aliyah appears to have come to an accommodation with her disease.  She uses a walker with adequate mobility and does not wish to reconsider orthopaedic surgery.  No falls have occurred for at least the past 6 months.    Chronic kidney disease   Last Assessment & Plan   05/10/2012 Office Visit Written 05/10/2012 12:21 PM by Yehuda Savannah, MD     Renal function has improved since 2009 and is now near normal.    Chronic systolic congestive heart failure, NYHA class 3   Last Assessment & Plan   01/05/2013 Office Visit Written 01/05/2013  2:18 PM by Evans Lance, MD     I discussed the treatment options with the patient. She has a class I indication for biventricular ICD with left bundle branch block and a QRS duration of greater than 150 ms, severe left ventricular dysfunction with an ejection fraction of 15%, despite maximal medical therapy, with her left ventricular dysfunction been present for over 6 years. I discussed the risk, goals, benefits, and expectations of biventricular ICD implantation with the patient and she wishes to proceed. This be scheduled early as possible pending at time.    Acute on chronic systolic heart failure   Atrial fibrillation   Implantable cardioverter-defibrillator-CRT- Mdt   Tobacco abuse       Imaging: US Abdomen Complete  06/14/2013   CLINICAL DATA:  Epigastric pain.  EXAM: ULTRASOUND ABDOMEN COMPLETE  COMPARISON:  Correlated with chest CT dated 11/02/2005  FINDINGS: Gallbladder:  Multiple polyps identified within the gallbladder. The largest measures 2.6 mm. There is no evidence of gallstones, sliding, gallbladder wall thickening, nor pericholecystic fluid. Gallbladder wall thickness is 1.8 mm. There is no evidence of a sonographic Murphy's sign.  Common bile duct:  Diameter: 3 mm  Liver:  A 0.6 x 0.5 x 0.5 cm hypoechoic well-defined nodule is appreciated within the left lobe of the liver. There is not appear to be appreciable increased through transmission. This finding likely rib represents a benign hemangioma and re-evaluation in 6 months is recommended. Liver is otherwise unremarkable.  IVC:  No abnormality visualized.  Pancreas:  Pancreatic duct is mildly prominent at 2.6 mm. The pancreas is otherwise unremarkable.  Spleen:  Size and appearance within normal limits.  Right Kidney:  Length: 9 cm. Echogenicity within normal limits. No mass or hydronephrosis visualized.  Left Kidney:  Length: 11.2 cm. Echogenicity within normal limits. No mass or hydronephrosis visualized.  Abdominal aorta:  No aneurysm visualized.   Other findings:  None.  IMPRESSION: Mildly prominent pancreatic duct. The pancreas is otherwise unremarkable. The considering patient's history of epigastric pain correlation pancreatic function tests is recommended. Likely small hemangioma within the left lobe of the liver re-evaluation in 6 months with ultrasound is recommended. Small polyps within the gallbladder   Electronically Signed   By: Margaree Mackintosh M.D.   On: 06/14/2013 09:06   Dg Chest Port 1 View  06/19/2013   CLINICAL DATA:  Short of breath  EXAM: PORTABLE CHEST - 1 VIEW  COMPARISON:  06/18/2013  FINDINGS: Cardiomegaly persists. Interstitial edema persists and is worsened. Small amount of accumulating pleural fluid. No other new finding.  IMPRESSION: Continued and worsened congestive heart failure with worsening edema and early pleural fluid accumulation.   Electronically Signed   By: Nelson Chimes M.D.   On: 06/19/2013 08:32   Dg Chest Portable 1 View  06/18/2013   CLINICAL DATA:  Chest pain and shortness of Breath.  EXAM: PORTABLE CHEST - 1 VIEW  COMPARISON:  01/25/2013  FINDINGS: There is moderate enlargement of the cardiopericardial silhouette, which represents a significant change from the prior study.  There is bilateral irregular interstitial thickening and central vascular congestion. No focal lung consolidation. No convincing pleural effusion and no pneumothorax. No mediastinal or hilar masses.  Right anterior chest wall sequential pacemaker is stable and well positioned.  IMPRESSION: Significant enlargement of the cardiopericardial silhouette since the prior study associated with interstitial edema, findings consistent with congestive heart failure.   Electronically Signed   By: Lajean Manes M.D.   On: 06/18/2013 19:41

## 2013-06-29 NOTE — Patient Instructions (Signed)
Your physician recommends that you schedule a follow-up appointment in: 3 months. Your physician recommends that you continue on your current medications as directed. Please refer to the Current Medication list given to you today. 

## 2013-06-29 NOTE — Assessment & Plan Note (Signed)
Due to see pacemaker clinic tomorrow for ICD interrogation.

## 2013-06-29 NOTE — Assessment & Plan Note (Signed)
BP is well controled. She is to avoid salty foods and to continue daily wts

## 2013-06-29 NOTE — Assessment & Plan Note (Signed)
Heart rate is well controlled with no recurrent episodes of palpitations or heart racing. She is continuing on titration of amiodarone as directed. They are marking their calender to be on top of dose changes. She is followed by Dr. Karie Kirks for coumadin dosing.  Will see her in 3 months.

## 2013-06-29 NOTE — Progress Notes (Signed)
HPI: [name] is a 68 year old patient of Dr. Lovena Le we are following for ongoing assessment and management of nonischemic heart myopathy, status post by the ICD insertion, . The patient was last seen by Dr. Lovena Le in July of 2014, with history of PAF status post DC CV. She was continued on maximum medical therapy. He remained in normal sinus rhythm when he saw her last her.    On last visit patient was admitted in December of 2014 with complaints of atrial fibrillation, with acute on chronic systolic heart failure. Underwent TEE cardioversion and amiodarone loading. She is also diuresis with IV Lasix. She also had asymptomatic runs of ventricular tachycardia. TEE cardioversion with successful for return to normal sinus rhythm. The patient is diuresis 157 pounds. The patient was reduced the amiodarone 400 mg daily for 2 weeks with further titration to 200 mg daily. Her digoxin dose was also decreased. The patient remained on Coumadin therapy.    She is without complaints today. She is weighing daily and is avoiding salty foods.              No Known Allergies  Current Outpatient Prescriptions  Medication Sig Dispense Refill  . amiodarone (PACERONE) 400 MG tablet 400mg  BID x 2 wks then 400mg  Daily x 2 wks then change to 200mg  daily (additional Rx provided).  42 tablet  0  . carvedilol (COREG) 25 MG tablet Take 25 mg by mouth 2 (two) times daily.        . digoxin (LANOXIN) 0.125 MG tablet Take 0.5 tablets (0.0625 mg total) by mouth daily.  30 tablet  3  . furosemide (LASIX) 80 MG tablet Take 80 mg by mouth 2 (two) times daily.       Marland Kitchen lisinopril (PRINIVIL,ZESTRIL) 10 MG tablet Take 0.5 tablets (5 mg total) by mouth daily.  30 tablet  6  . metFORMIN (GLUCOPHAGE) 1000 MG tablet Take 1 tablet (1,000 mg total) by mouth 2 (two) times daily. HOLD for today, then resume usual dose tomorrow, 01/26/2013.      . metoCLOPramide (REGLAN) 5 MG tablet Take 5 mg by mouth 4 (four) times daily.      Marland Kitchen  oxyCODONE-acetaminophen (PERCOCET) 10-325 MG per tablet Take 1 tablet by mouth 4 (four) times daily as needed (for severe pain).       . polyethylene glycol powder (GLYCOLAX/MIRALAX) powder Take 17 g by mouth daily as needed (constipation).       . potassium chloride SA (K-DUR,KLOR-CON) 20 MEQ tablet Take 1 tablet (20 mEq total) by mouth daily.  30 tablet  6  . pravastatin (PRAVACHOL) 40 MG tablet Take 80 mg by mouth at bedtime.       Marland Kitchen warfarin (COUMADIN) 5 MG tablet Take 2.5 mg by mouth every evening.      Derrill Memo ON 07/21/2013] amiodarone (PACERONE) 200 MG tablet Take 1 tablet (200 mg total) by mouth daily.  30 tablet  6  . HYDROcodone-acetaminophen (NORCO) 10-325 MG per tablet Take 1 tablet by mouth 3 (three) times daily as needed for pain (for pain).       No current facility-administered medications for this visit.    Past Medical History  Diagnosis Date  . Cardiomyopathy, nonischemic     a. 1999 nl cath;  b. 12/05 [employer] BiVICD;  c. 10/2005 ICD extraction 2/2 enterococcus bacteremia and Veg on RV lead;  c. 05/2008 low risk Myoview (scarring w/ some evidence of inf ischemia);  d. 11/2012 Echo: EF 15-20%;  e. 01/2013 s/p MDT [facility] CRT D, ser # [id number];  f. 05/2013 Echo: EF 15%.  . Enterococcal infection     a. 10/2005 - AICD-explanted  . Pulmonary embolism     a. 07/2005 after total right hip arthroplasty  . Hilar density     a. infrahilar mass/adenopathy on CT scan 5/07; subsequently  resolved  . LBBB (left bundle branch block)   . GERD (gastroesophageal reflux disease)   . Hyperlipidemia   . Hypertension   . Tobacco abuse     a. discontinued in 1997, and then resumed  . Urinary incontinence   . Anemia     a. mild/chronic  . Villous adenoma of colon     a. tubovillous adenomatous polyp with focal high grade dysplasia; presented with hematochezia - followed by Dr. Laural Golden.  . CKD (chronic kidney disease), stage III     creatinin-1.44 in 1/09; 1.51 in 1/10  . Implantable  cardioverter-defibrillator-CRT- Mdt     a.  01/2013 s/p MDT [facility] CRT D, ser # [id number]  . Chronic systolic CHF (congestive heart failure)     a. 11/2012 Echo: EF 15-20%;  b. 05/2013 TEE EF 15%.  . Pneumonia 07/2005  . Obstructive sleep apnea     a. mild-did not tolerate CPAP (01/24/2013)  . Type II diabetes mellitus   . History of blood transfusion   . Degenerative joint disease     of knees, shoulder, and hips  . PAF (paroxysmal atrial fibrillation)     a. 07/2012 s/p TEE/DCCV;  b. chronic coumadin;  c. 05/2013 Recurrent Afib->TEE/DCCV and amio initiation.    Past Surgical History  Procedure Laterality Date  . Pacemaker removal  11/18/05    Enterococcal infection  . Total hip arthroplasty Right 07/2005  . Knee arthroscopy Right 1980's?  . Colonoscopy w/ polypectomy  2009  . Cardioversion N/A 08/20/2012    Procedure: TEE GUIDED CARDIOVERSION;  Surgeon: Yehuda Savannah, MD;  Location: [facility];  Service: Cardiovascular;  Laterality: N/A;  To be done @ bedside  . Tee without cardioversion N/A 08/20/2012    Procedure: TRANSESOPHAGEAL ECHOCARDIOGRAM (TEE);  Surgeon: Yehuda Savannah, MD;  Location: [facility];  Service: Cardiovascular;  Laterality: N/A;  . Bi-ventricular implantable cardioverter defibrillator  (crt-d)  01/24/2013  . A-v cardiac pacemaker insertion  12/05    Biventricular pacemaker/AICD  . Abdominal hysterectomy  1990/92    Initial partial hysterectomy followed by BSO  . Tubal ligation  1980's  . Cardiac catheterization    . Colonoscopy with esophagogastroduodenoscopy (egd) N/A 06/10/2013    Procedure: COLONOSCOPY WITH ESOPHAGOGASTRODUODENOSCOPY (EGD);  Surgeon: Rogene Houston, MD;  [facility]: [facility];  Service: Endoscopy;  Laterality: N/A;  925  . Tee without cardioversion N/A 06/20/2013    Procedure: TRANSESOPHAGEAL ECHOCARDIOGRAM (TEE);  Surgeon: Dorothy Spark, MD;  Location: [place];  Service: Cardiovascular;  Laterality: N/A;  . Cardioversion N/A 06/20/2013      Procedure: CARDIOVERSION;  Surgeon: Dorothy Spark, MD;  Location: [facility];  Service: Cardiovascular;  Laterality: N/A;    [id number] of systems complete and found to be negative unless listed above  PHYSICAL EXAM BP 128/66  Pulse 70  Ht 5\' 5"  (1.651 m)  Wt 153 lb 4 oz (69.514 kg)  BMI 25.50 kg/m2  General: Well developed, well nourished, in no acute distress Head: Eyes PERRLA, No xanthomas.   Normal cephalic and atramatic  Lungs: Clear bilaterally to auscultation and percussion. Heart: HRRR S1 S2, without  MRG.  Pulses are 2+ & equal.            No carotid bruit. No JVD.  No abdominal bruits. No femoral bruits. Abdomen: Bowel sounds are positive, abdomen soft and non-tender without masses or                  Hernia's noted. Msk:  Back normal, normal gait. Normal strength and tone for age. Extremities: No clubbing, cyanosis or edema.  DP +1 Neuro: Alert and oriented X 3. Psych:  Good affect, responds appropriately  Review of systems complete and found to be negative unless listed above    ASSESSMENT AND PLAN

## 2013-06-30 ENCOUNTER — Encounter: Payer: Self-pay | Admitting: Internal Medicine

## 2013-06-30 ENCOUNTER — Ambulatory Visit (INDEPENDENT_AMBULATORY_CARE_PROVIDER_SITE_OTHER): Payer: Medicare HMO | Admitting: Internal Medicine

## 2013-06-30 VITALS — BP 123/73 | HR 70 | Ht 65.0 in | Wt 153.0 lb

## 2013-06-30 DIAGNOSIS — I509 Heart failure, unspecified: Secondary | ICD-10-CM

## 2013-06-30 DIAGNOSIS — I4891 Unspecified atrial fibrillation: Secondary | ICD-10-CM

## 2013-06-30 DIAGNOSIS — I428 Other cardiomyopathies: Secondary | ICD-10-CM

## 2013-06-30 DIAGNOSIS — I5022 Chronic systolic (congestive) heart failure: Secondary | ICD-10-CM

## 2013-06-30 LAB — MDC_IDC_ENUM_SESS_TYPE_INCLINIC
Brady Statistic AP VP Percent: 86.13 %
Brady Statistic AP VS Percent: 1.44 %
Brady Statistic AS VP Percent: 12.15 %
Brady Statistic AS VS Percent: 0.28 %
HIGH POWER IMPEDANCE MEASURED VALUE: 247 Ohm
HighPow Impedance: 48 Ohm
Lead Channel Impedance Value: 342 Ohm
Lead Channel Impedance Value: 399 Ohm
Lead Channel Impedance Value: 418 Ohm
Lead Channel Impedance Value: 646 Ohm
Lead Channel Pacing Threshold Amplitude: 0.875 V
Lead Channel Pacing Threshold Amplitude: 1.25 V
Lead Channel Pacing Threshold Pulse Width: 0.4 ms
Lead Channel Pacing Threshold Pulse Width: 0.4 ms
Lead Channel Pacing Threshold Pulse Width: 0.4 ms
Lead Channel Sensing Intrinsic Amplitude: 2.375 mV
Lead Channel Setting Pacing Amplitude: 1.75 V
Lead Channel Setting Pacing Amplitude: 2.5 V
Lead Channel Setting Pacing Pulse Width: 0.4 ms
MDC IDC MSMT BATTERY REMAINING LONGEVITY: 109 mo
MDC IDC MSMT BATTERY VOLTAGE: 3.01 V
MDC IDC MSMT LEADCHNL RV IMPEDANCE VALUE: 532 Ohm
MDC IDC MSMT LEADCHNL RV PACING THRESHOLD AMPLITUDE: 1.375 V
MDC IDC MSMT LEADCHNL RV SENSING INTR AMPL: 22.375 mV
MDC IDC SESS DTM: 20150108111007
MDC IDC SET LEADCHNL LV PACING AMPLITUDE: 2.25 V
MDC IDC SET LEADCHNL LV PACING PULSEWIDTH: 0.4 ms
MDC IDC SET LEADCHNL RV SENSING SENSITIVITY: 0.3 mV
MDC IDC SET ZONE DETECTION INTERVAL: 360 ms
MDC IDC STAT BRADY RA PERCENT PACED: 87.57 %
MDC IDC STAT BRADY RV PERCENT PACED: 52.94 %
Zone Setting Detection Interval: 300 ms
Zone Setting Detection Interval: 350 ms
Zone Setting Detection Interval: 350 ms

## 2013-06-30 MED ORDER — AMIODARONE HCL 200 MG PO TABS: ORAL_TABLET | ORAL | Status: DC

## 2013-06-30 NOTE — Progress Notes (Signed)
HPI [name] returns today for followup. She is a pleasant 68 yo woman with a h/o chronic systolic heart failure, an ischemic CM, LBBB, and recent problems with atrial fibrillation requiring hospitalization. She has been on fairly high dose amiodarone. She is s/p ICD implant with a BiV device placed several months ago. She denies chest pain. She has class 2-3 heart failure, depending on whether she is in atrial fib or NSR. No Known Allergies   Current Outpatient Prescriptions  Medication Sig Dispense Refill  . amiodarone (PACERONE) 200 MG tablet On Jan 14th take 2 tablets daily, On Jan 28th  Take 1 tablet daily.  45 tablet  6  . carvedilol (COREG) 25 MG tablet Take 25 mg by mouth 2 (two) times daily.        . digoxin (LANOXIN) 0.125 MG tablet Take 0.5 tablets (0.0625 mg total) by mouth daily.  30 tablet  3  . furosemide (LASIX) 80 MG tablet Take 80 mg by mouth 2 (two) times daily.       Marland Kitchen HYDROcodone-acetaminophen (NORCO) 10-325 MG per tablet Take 1 tablet by mouth 3 (three) times daily as needed for pain (for pain).      Marland Kitchen lisinopril (PRINIVIL,ZESTRIL) 10 MG tablet Take 0.5 tablets (5 mg total) by mouth daily.  30 tablet  6  . metFORMIN (GLUCOPHAGE) 1000 MG tablet Take 1 tablet (1,000 mg total) by mouth 2 (two) times daily. HOLD for today, then resume usual dose tomorrow, 01/26/2013.      . metoCLOPramide (REGLAN) 5 MG tablet Take 5 mg by mouth 4 (four) times daily.      Marland Kitchen oxyCODONE-acetaminophen (PERCOCET) 10-325 MG per tablet Take 1 tablet by mouth 4 (four) times daily as needed (for severe pain).       . polyethylene glycol powder (GLYCOLAX/MIRALAX) powder Take 17 g by mouth daily as needed (constipation).       . potassium chloride SA (K-DUR,KLOR-CON) 20 MEQ tablet Take 1 tablet (20 mEq total) by mouth daily.  30 tablet  6  . pravastatin (PRAVACHOL) 40 MG tablet Take 80 mg by mouth at bedtime.       Marland Kitchen warfarin (COUMADIN) 5 MG tablet Take 2.5 mg by mouth every evening.       No  current facility-administered medications for this visit.     Past Medical History  Diagnosis Date  . Cardiomyopathy, nonischemic     a. 1999 nl cath;  b. 12/05 [employer] [place];  c. 10/2005 ICD extraction 2/2 enterococcus bacteremia and Veg on RV lead;  c. 05/2008 low risk Myoview (scarring w/ some evidence of inf ischemia);  d. 11/2012 Echo: EF 15-20%;  e. 01/2013 s/p MDT [facility] CRT D, ser # [id number];  f. 05/2013 Echo: EF 15%.  . Enterococcal infection     a. 10/2005 - AICD-explanted  . Pulmonary embolism     a. 07/2005 after total right hip arthroplasty  . Hilar density     a. infrahilar mass/adenopathy on CT scan 5/07; subsequently  resolved  . LBBB (left bundle branch block)   . GERD (gastroesophageal reflux disease)   . Hyperlipidemia   . Hypertension   . Tobacco abuse     a. discontinued in 1997, and then resumed  . Urinary incontinence   . Anemia     a. mild/chronic  . Villous adenoma of colon     a. tubovillous adenomatous polyp with focal high grade dysplasia; presented with hematochezia - followed by Dr.  Rehman.  . CKD (chronic kidney disease), stage III     creatinin-1.44 in 1/09; 1.51 in 1/10  . Implantable cardioverter-defibrillator-CRT- Mdt     a.  01/2013 s/p MDT [facility] CRT D, ser # [id number]  . Chronic systolic CHF (congestive heart failure)     a. 11/2012 Echo: EF 15-20%;  b. 05/2013 TEE EF 15%.  . Pneumonia 07/2005  . Obstructive sleep apnea     a. mild-did not tolerate CPAP (01/24/2013)  . Type II diabetes mellitus   . History of blood transfusion   . Degenerative joint disease     of knees, shoulder, and hips  . PAF (paroxysmal atrial fibrillation)     a. 07/2012 s/p TEE/DCCV;  b. chronic coumadin;  c. 05/2013 Recurrent Afib->TEE/DCCV and amio initiation.    ROS:   All systems reviewed and negative except as noted in the HPI.   Past Surgical History  Procedure Laterality Date  . Pacemaker removal  11/18/05    Enterococcal infection  . Total hip  arthroplasty Right 07/2005  . Knee arthroscopy Right 1980's?  . Colonoscopy w/ polypectomy  2009  . Cardioversion N/A 08/20/2012    Procedure: TEE GUIDED CARDIOVERSION;  Surgeon: Yehuda Savannah, MD;  Location: [facility];  Service: Cardiovascular;  Laterality: N/A;  To be done @ bedside  . Tee without cardioversion N/A 08/20/2012    Procedure: TRANSESOPHAGEAL ECHOCARDIOGRAM (TEE);  Surgeon: Yehuda Savannah, MD;  Location: [facility];  Service: Cardiovascular;  Laterality: N/A;  . Bi-ventricular implantable cardioverter defibrillator  (crt-d)  01/24/2013  . A-v cardiac pacemaker insertion  12/05    Biventricular pacemaker/AICD  . Abdominal hysterectomy  1990/92    Initial partial hysterectomy followed by BSO  . Tubal ligation  1980's  . Cardiac catheterization    . Colonoscopy with esophagogastroduodenoscopy (egd) N/A 06/10/2013    Procedure: COLONOSCOPY WITH ESOPHAGOGASTRODUODENOSCOPY (EGD);  Surgeon: Rogene Houston, MD;  [facility];  Service: Endoscopy;  Laterality: N/A;  925  . Tee without cardioversion N/A 06/20/2013    Procedure: TRANSESOPHAGEAL ECHOCARDIOGRAM (TEE);  Surgeon: Dorothy Spark, MD;  Location: [place];  Service: Cardiovascular;  Laterality: N/A;  . Cardioversion N/A 06/20/2013    Procedure: CARDIOVERSION;  Surgeon: Dorothy Spark, MD;  Location: [facility] ENDOSCOPY;  Service: Cardiovascular;  Laterality: N/A;     Family History  Problem Relation Age of Onset  . Hypertension Mother   . Diabetes Mother   . Coronary artery disease Father   . Diabetes Brother   . Hypertension Brother   . Lung cancer Brother   . Arthritis Other   . Diabetes Other   . Heart disease Other     female < 67     History   Social History  . Marital Status: Married    Spouse Name: N/A    Number of Children: N/A  . Years of Education: N/A   Occupational History  . Not on file.   Social History Main Topics  . Smoking status: Former Smoker -- 12 years    Types: Cigarettes    . Smokeless tobacco: Never Used     Comment: 05/2013: Smokes an occasional cigarette.  Says that she doesn't inhale.  . Alcohol Use: No  . Drug Use: No  . Sexual Activity: Not Currently    Birth Control/ Protection: None   Other Topics Concern  . Not on file   Social History Narrative   Married, lives in [place] with spouse. Retired Secretary/administrator.  BP 123/73  Pulse 70  Ht 5\' 5"  (1.651 m)  Wt 153 lb (69.4 kg)  BMI 25.46 kg/m2  Physical Exam:  stable appearing 68 yo woman, NAD HEENT: Unremarkable Neck:  No JVD, no thyromegally Back:  No CVA tenderness Lungs:  Clear with no wheezes HEART:  Regular rate rhythm, no murmurs, no rubs, no clicks Abd:  soft, positive bowel sounds, no organomegally, no rebound, no guarding Ext:  2 plus pulses, no edema, no cyanosis, no clubbing Skin:  No rashes no nodules Neuro:  CN II through XII intact, motor grossly intact  ECG - NSR with AV sequential pacing  DEVICE  Normal device function.  See PaceArt for details.   Assess/Plan:

## 2013-06-30 NOTE — Patient Instructions (Addendum)
Your physician recommends that you schedule a follow-up appointment in: 3 months with Dr Lovena Le  Decrease Amiodarone on January 14th to 2 (200 mg)  tablets daily Decrease Amiodarone on January 28th  1 tablet (200 mg) Daily.

## 2013-06-30 NOTE — Progress Notes (Deleted)
HPI  No Known Allergies   Current Outpatient Prescriptions  Medication Sig Dispense Refill  . [START ON 07/21/2013] amiodarone (PACERONE) 200 MG tablet Take 1 tablet (200 mg total) by mouth daily.  30 tablet  6  . amiodarone (PACERONE) 400 MG tablet 400mg  BID x 2 wks then 400mg  Daily x 2 wks then change to 200mg  daily (additional Rx provided).  42 tablet  0  . carvedilol (COREG) 25 MG tablet Take 25 mg by mouth 2 (two) times daily.        . digoxin (LANOXIN) 0.125 MG tablet Take 0.5 tablets (0.0625 mg total) by mouth daily.  30 tablet  3  . furosemide (LASIX) 80 MG tablet Take 80 mg by mouth 2 (two) times daily.       Marland Kitchen HYDROcodone-acetaminophen (NORCO) 10-325 MG per tablet Take 1 tablet by mouth 3 (three) times daily as needed for pain (for pain).      Marland Kitchen lisinopril (PRINIVIL,ZESTRIL) 10 MG tablet Take 0.5 tablets (5 mg total) by mouth daily.  30 tablet  6  . metFORMIN (GLUCOPHAGE) 1000 MG tablet Take 1 tablet (1,000 mg total) by mouth 2 (two) times daily. HOLD for today, then resume usual dose tomorrow, 01/26/2013.      . metoCLOPramide (REGLAN) 5 MG tablet Take 5 mg by mouth 4 (four) times daily.      Marland Kitchen oxyCODONE-acetaminophen (PERCOCET) 10-325 MG per tablet Take 1 tablet by mouth 4 (four) times daily as needed (for severe pain).       . polyethylene glycol powder (GLYCOLAX/MIRALAX) powder Take 17 g by mouth daily as needed (constipation).       . potassium chloride SA (K-DUR,KLOR-CON) 20 MEQ tablet Take 1 tablet (20 mEq total) by mouth daily.  30 tablet  6  . pravastatin (PRAVACHOL) 40 MG tablet Take 80 mg by mouth at bedtime.       Marland Kitchen warfarin (COUMADIN) 5 MG tablet Take 2.5 mg by mouth every evening.       No current facility-administered medications for this visit.     Past Medical History  Diagnosis Date  . Cardiomyopathy, nonischemic     a. 1999 nl cath;  b. 12/05 [employer] [place];  c. 10/2005 ICD extraction 2/2 enterococcus bacteremia and Veg on RV lead;  c. 05/2008 low  risk Myoview (scarring w/ some evidence of inf ischemia);  d. 11/2012 Echo: EF 15-20%;  e. 01/2013 s/p MDT [facility] CRT D, ser # [id number];  f. 05/2013 Echo: EF 15%.  . Enterococcal infection     a. 10/2005 - AICD-explanted  . Pulmonary embolism     a. 07/2005 after total right hip arthroplasty  . Hilar density     a. infrahilar mass/adenopathy on CT scan 5/07; subsequently  resolved  . LBBB (left bundle branch block)   . GERD (gastroesophageal reflux disease)   . Hyperlipidemia   . Hypertension   . Tobacco abuse     a. discontinued in 1997, and then resumed  . Urinary incontinence   . Anemia     a. mild/chronic  . Villous adenoma of colon     a. tubovillous adenomatous polyp with focal high grade dysplasia; presented with hematochezia - followed by Dr. Laural Golden.  . CKD (chronic kidney disease), stage III     creatinin-1.44 in 1/09; 1.51 in 1/10  . Implantable cardioverter-defibrillator-CRT- Mdt     a.  01/2013 s/p MDT [facility] CRT D, ser # [id number]  . Chronic systolic  CHF (congestive heart failure)     a. 11/2012 Echo: EF 15-20%;  b. 05/2013 TEE EF 15%.  . Pneumonia 07/2005  . Obstructive sleep apnea     a. mild-did not tolerate CPAP (01/24/2013)  . Type II diabetes mellitus   . History of blood transfusion   . Degenerative joint disease     of knees, shoulder, and hips  . PAF (paroxysmal atrial fibrillation)     a. 07/2012 s/p TEE/DCCV;  b. chronic coumadin;  c. 05/2013 Recurrent Afib->TEE/DCCV and amio initiation.    ROS:   All systems reviewed and negative except as noted in the HPI.   Past Surgical History  Procedure Laterality Date  . Pacemaker removal  11/18/05    Enterococcal infection  . Total hip arthroplasty Right 07/2005  . Knee arthroscopy Right 1980's?  . Colonoscopy w/ polypectomy  2009  . Cardioversion N/A 08/20/2012    Procedure: TEE GUIDED CARDIOVERSION;  Surgeon: Yehuda Savannah, MD;  Location: [facility];  Service: Cardiovascular;  Laterality: N/A;  To be done @  bedside  . Tee without cardioversion N/A 08/20/2012    Procedure: TRANSESOPHAGEAL ECHOCARDIOGRAM (TEE);  Surgeon: Yehuda Savannah, MD;  Location: [facility];  Service: Cardiovascular;  Laterality: N/A;  . Bi-ventricular implantable cardioverter defibrillator  (crt-d)  01/24/2013  . A-v cardiac pacemaker insertion  12/05    Biventricular pacemaker/AICD  . Abdominal hysterectomy  1990/92    Initial partial hysterectomy followed by BSO  . Tubal ligation  1980's  . Cardiac catheterization    . Colonoscopy with esophagogastroduodenoscopy (egd) N/A 06/10/2013    Procedure: COLONOSCOPY WITH ESOPHAGOGASTRODUODENOSCOPY (EGD);  Surgeon: Rogene Houston, MD;  [facility];  Service: Endoscopy;  Laterality: N/A;  925  . Tee without cardioversion N/A 06/20/2013    Procedure: TRANSESOPHAGEAL ECHOCARDIOGRAM (TEE);  Surgeon: Dorothy Spark, MD;  Location: [place];  Service: Cardiovascular;  Laterality: N/A;  . Cardioversion N/A 06/20/2013    Procedure: CARDIOVERSION;  Surgeon: Dorothy Spark, MD;  Location: [facility] ENDOSCOPY;  Service: Cardiovascular;  Laterality: N/A;     Family History  Problem Relation Age of Onset  . Hypertension Mother   . Diabetes Mother   . Coronary artery disease Father   . Diabetes Brother   . Hypertension Brother   . Lung cancer Brother   . Arthritis Other   . Diabetes Other   . Heart disease Other     female < 68     History   Social History  . Marital Status: Married    Spouse Name: N/A    Number of Children: N/A  . Years of Education: N/A   Occupational History  . Not on file.   Social History Main Topics  . Smoking status: Former Smoker -- 12 years    Types: Cigarettes  . Smokeless tobacco: Never Used     Comment: 05/2013: Smokes an occasional cigarette.  Says that she doesn't inhale.  . Alcohol Use: No  . Drug Use: No  . Sexual Activity: Not Currently    Birth Control/ Protection: None   Other Topics Concern  . Not on file   Social  History Narrative   Married, lives in [place] with spouse. Retired Secretary/administrator.      BP 123/73  Pulse 70  Ht 5\' 5"  (1.651 m)  Wt 153 lb (69.4 kg)  BMI 25.46 kg/m2  Physical Exam:  Well appearing NAD HEENT: Unremarkable Neck:  No JVD, no thyromegally Lymphatics:  No adenopathy  Back:  No CVA tenderness Lungs:  Clear HEART:  Regular rate rhythm, no murmurs, no rubs, no clicks Abd:  soft, positive bowel sounds, no organomegally, no rebound, no guarding Ext:  2 plus pulses, no edema, no cyanosis, no clubbing Skin:  No rashes no nodules Neuro:  CN II through XII intact, motor grossly intact  EKG  DEVICE  Normal device function.  See PaceArt for details.   Assess/Plan:

## 2013-07-04 ENCOUNTER — Ambulatory Visit (HOSPITAL_COMMUNITY): Payer: Medicare PPO

## 2013-07-06 ENCOUNTER — Ambulatory Visit (HOSPITAL_COMMUNITY)
Admission: RE | Admit: 2013-07-06 | Discharge: 2013-07-06 | Disposition: A | Discharge status: Home / Self Care | Payer: Medicare HMO | Source: Ambulatory Visit | Attending: Internal Medicine | Admitting: Internal Medicine

## 2013-07-06 DIAGNOSIS — R110 Nausea: Secondary | ICD-10-CM | POA: Insufficient documentation

## 2013-07-06 DIAGNOSIS — I10 Essential (primary) hypertension: Secondary | ICD-10-CM | POA: Insufficient documentation

## 2013-07-06 DIAGNOSIS — R112 Nausea with vomiting, unspecified: Secondary | ICD-10-CM

## 2013-07-06 DIAGNOSIS — K7689 Other specified diseases of liver: Secondary | ICD-10-CM | POA: Insufficient documentation

## 2013-07-06 DIAGNOSIS — I517 Cardiomegaly: Secondary | ICD-10-CM | POA: Insufficient documentation

## 2013-07-06 DIAGNOSIS — R1013 Epigastric pain: Secondary | ICD-10-CM | POA: Insufficient documentation

## 2013-07-06 DIAGNOSIS — E278 Other specified disorders of adrenal gland: Secondary | ICD-10-CM | POA: Insufficient documentation

## 2013-07-06 DIAGNOSIS — G8929 Other chronic pain: Secondary | ICD-10-CM

## 2013-07-06 DIAGNOSIS — R634 Abnormal weight loss: Secondary | ICD-10-CM | POA: Insufficient documentation

## 2013-07-06 DIAGNOSIS — I319 Disease of pericardium, unspecified: Secondary | ICD-10-CM | POA: Insufficient documentation

## 2013-07-06 DIAGNOSIS — K5730 Diverticulosis of large intestine without perforation or abscess without bleeding: Secondary | ICD-10-CM | POA: Insufficient documentation

## 2013-07-06 DIAGNOSIS — E119 Type 2 diabetes mellitus without complications: Secondary | ICD-10-CM | POA: Insufficient documentation

## 2013-07-06 MED ORDER — IOHEXOL 300 MG/ML  SOLN
100.0000 mL | Freq: Once | INTRAMUSCULAR | Status: AC | PRN
  Administered 2013-07-06: 100 mL via INTRAVENOUS

## 2013-07-21 ENCOUNTER — Encounter: Payer: Medicare Other | Admitting: Cardiovascular Disease

## 2013-07-26 ENCOUNTER — Encounter: Payer: Commercial Managed Care - HMO | Admitting: Adult Health

## 2013-09-30 ENCOUNTER — Ambulatory Visit (INDEPENDENT_AMBULATORY_CARE_PROVIDER_SITE_OTHER): Payer: Medicare HMO | Admitting: Internal Medicine

## 2013-09-30 ENCOUNTER — Encounter: Payer: Self-pay | Admitting: Internal Medicine

## 2013-09-30 VITALS — BP 131/71 | HR 73 | Ht 65.0 in | Wt 161.1 lb

## 2013-09-30 DIAGNOSIS — Z95810 Presence of automatic (implantable) cardiac defibrillator: Secondary | ICD-10-CM

## 2013-09-30 DIAGNOSIS — I428 Other cardiomyopathies: Secondary | ICD-10-CM

## 2013-09-30 DIAGNOSIS — I5023 Acute on chronic systolic (congestive) heart failure: Secondary | ICD-10-CM

## 2013-09-30 DIAGNOSIS — I4891 Unspecified atrial fibrillation: Secondary | ICD-10-CM

## 2013-09-30 LAB — MDC_IDC_ENUM_SESS_TYPE_INCLINIC
Battery Remaining Longevity: 90 mo
Battery Voltage: 3.01 V
Brady Statistic AP VP Percent: 86.63 %
Brady Statistic AP VS Percent: 1.87 %
Brady Statistic AS VS Percent: 0.18 %
Brady Statistic RA Percent Paced: 88.5 %
HIGH POWER IMPEDANCE MEASURED VALUE: 53 Ohm
HighPow Impedance: 228 Ohm
Lead Channel Impedance Value: 361 Ohm
Lead Channel Impedance Value: 399 Ohm
Lead Channel Impedance Value: 551 Ohm
Lead Channel Pacing Threshold Amplitude: 1 V
Lead Channel Pacing Threshold Pulse Width: 0.4 ms
Lead Channel Pacing Threshold Pulse Width: 0.4 ms
Lead Channel Pacing Threshold Pulse Width: 0.4 ms
Lead Channel Sensing Intrinsic Amplitude: 0.875 mV
Lead Channel Sensing Intrinsic Amplitude: 23.375 mV
Lead Channel Sensing Intrinsic Amplitude: 29.125 mV
Lead Channel Setting Pacing Amplitude: 1.75 V
Lead Channel Setting Pacing Amplitude: 2 V
Lead Channel Setting Pacing Amplitude: 3 V
Lead Channel Setting Pacing Pulse Width: 0.4 ms
Lead Channel Setting Pacing Pulse Width: 0.4 ms
MDC IDC MSMT LEADCHNL LV IMPEDANCE VALUE: 646 Ohm
MDC IDC MSMT LEADCHNL LV PACING THRESHOLD AMPLITUDE: 1.5 V
MDC IDC MSMT LEADCHNL RA IMPEDANCE VALUE: 399 Ohm
MDC IDC MSMT LEADCHNL RA SENSING INTR AMPL: 2 mV
MDC IDC MSMT LEADCHNL RV PACING THRESHOLD AMPLITUDE: 1 V
MDC IDC SESS DTM: 20150410085508
MDC IDC SET LEADCHNL RV SENSING SENSITIVITY: 0.3 mV
MDC IDC SET ZONE DETECTION INTERVAL: 300 ms
MDC IDC STAT BRADY AS VP PERCENT: 11.32 %
MDC IDC STAT BRADY RV PERCENT PACED: 49.31 %
Zone Setting Detection Interval: 350 ms
Zone Setting Detection Interval: 350 ms
Zone Setting Detection Interval: 360 ms

## 2013-09-30 MED ORDER — AMIODARONE HCL 200 MG PO TABS: 200.0000 mg | ORAL_TABLET | Freq: Every day | ORAL | Status: DC

## 2013-09-30 NOTE — Patient Instructions (Addendum)
Your physician recommends that you schedule a follow-up appointment in: In Jan. with Dr Knox Saliva will receive a reminder letter two months in advance reminding you to call and schedule your appointment. If you don't receive this letter, please contact our office.  Remote monitoring is used to monitor your Pacemaker or ICD from home. This monitoring reduces the number of office visits required to check your device to one time per year. It allows [place] to keep an eye on the functioning of your device to ensure it is working properly. You are scheduled for a device check from home on 01-02-14. You may send your transmission at any time that day. If you have a wireless device, the transmission will be sent automatically. After your physician reviews your transmission, you will receive a postcard with your next transmission date.

## 2013-09-30 NOTE — Progress Notes (Signed)
HPI [name] returns today for followup. She is a pleasant 68 yo woman with a h/o chronic systolic heart failure, an ischemic CM, LBBB, and recent problems with atrial fibrillation requiring hospitalization. She has been on fairly high dose amiodarone but we decreased her dose in January. She is s/p ICD implant with a BiV device placed several months ago. She denies chest pain. She has class 2-3 heart failure, depending on whether she is in atrial fib or NSR. She has maintained NSR exclusively since January and is class 2A. No Known Allergies   Current Outpatient Prescriptions  Medication Sig Dispense Refill  . amiodarone (PACERONE) 200 MG tablet On Jan 14th take 2 tablets daily, On Jan 28th  Take 1 tablet daily.  45 tablet  6  . carvedilol (COREG) 25 MG tablet Take 25 mg by mouth 2 (two) times daily.        . digoxin (LANOXIN) 0.125 MG tablet Take 0.5 tablets (0.0625 mg total) by mouth daily.  30 tablet  3  . furosemide (LASIX) 80 MG tablet Take 80 mg by mouth 2 (two) times daily.       Marland Kitchen HYDROcodone-acetaminophen (NORCO) 10-325 MG per tablet Take 1 tablet by mouth 3 (three) times daily as needed for pain (for pain).      Marland Kitchen lisinopril (PRINIVIL,ZESTRIL) 10 MG tablet Take 0.5 tablets (5 mg total) by mouth daily.  30 tablet  6  . metFORMIN (GLUCOPHAGE) 1000 MG tablet Take 1 tablet (1,000 mg total) by mouth 2 (two) times daily. HOLD for today, then resume usual dose tomorrow, 01/26/2013.      . metoCLOPramide (REGLAN) 5 MG tablet Take 5 mg by mouth 4 (four) times daily.      Marland Kitchen oxyCODONE-acetaminophen (PERCOCET) 10-325 MG per tablet Take 1 tablet by mouth 4 (four) times daily as needed (for severe pain).       . polyethylene glycol powder (GLYCOLAX/MIRALAX) powder Take 17 g by mouth daily as needed (constipation).       . potassium chloride SA (K-DUR,KLOR-CON) 20 MEQ tablet Take 1 tablet (20 mEq total) by mouth daily.  30 tablet  6  . pravastatin (PRAVACHOL) 40 MG tablet Take 80 mg by  mouth at bedtime.       Marland Kitchen warfarin (COUMADIN) 5 MG tablet Take 2.5 mg by mouth every evening.       No current facility-administered medications for this visit.     Past Medical History  Diagnosis Date  . Cardiomyopathy, nonischemic     a. 1999 nl cath;  b. 12/05 [employer] [place];  c. 10/2005 ICD extraction 2/2 enterococcus bacteremia and Veg on RV lead;  c. 05/2008 low risk Myoview (scarring w/ some evidence of inf ischemia);  d. 11/2012 Echo: EF 15-20%;  e. 01/2013 s/p MDT [place] [facility] CRT D, ser # [id number];  f. 05/2013 Echo: EF 15%.  . Enterococcal infection     a. 10/2005 - AICD-explanted  . Pulmonary embolism     a. 07/2005 after total right hip arthroplasty  . Hilar density     a. infrahilar mass/adenopathy on CT scan 5/07; subsequently  resolved  . LBBB (left bundle branch block)   . GERD (gastroesophageal reflux disease)   . Hyperlipidemia   . Hypertension   . Tobacco abuse     a. discontinued in 1997, and then resumed  . Urinary incontinence   . Anemia     a. mild/chronic  . Villous adenoma of colon  a. tubovillous adenomatous polyp with focal high grade dysplasia; presented with hematochezia - followed by Dr. Laural Golden.  . CKD (chronic kidney disease), stage III     creatinin-1.44 in 1/09; 1.51 in 1/10  . Implantable cardioverter-defibrillator-CRT- Mdt     a.  01/2013 s/p MDT [place] [facility] CRT D, ser # [id number]  . Chronic systolic CHF (congestive heart failure)     a. 11/2012 Echo: EF 15-20%;  b. 05/2013 TEE EF 15%.  . Pneumonia 07/2005  . Obstructive sleep apnea     a. mild-did not tolerate CPAP (01/24/2013)  . Type II diabetes mellitus   . History of blood transfusion   . Degenerative joint disease     of knees, shoulder, and hips  . PAF (paroxysmal atrial fibrillation)     a. 07/2012 s/p TEE/DCCV;  b. chronic coumadin;  c. 05/2013 Recurrent Afib->TEE/DCCV and amio initiation.    ROS:   All systems reviewed and negative except as noted in the HPI.   Past Surgical History   Procedure Laterality Date  . Pacemaker removal  11/18/05    Enterococcal infection  . Total hip arthroplasty Right 07/2005  . Knee arthroscopy Right 1980's?  . Colonoscopy w/ polypectomy  2009  . Cardioversion N/A 08/20/2012    Procedure: TEE GUIDED CARDIOVERSION;  Surgeon: Yehuda Savannah, MD;  Location: [facility];  Service: Cardiovascular;  Laterality: N/A;  To be done @ bedside  . Tee without cardioversion N/A 08/20/2012    Procedure: TRANSESOPHAGEAL ECHOCARDIOGRAM (TEE);  Surgeon: Yehuda Savannah, MD;  Location: [facility];  Service: Cardiovascular;  Laterality: N/A;  . Bi-ventricular implantable cardioverter defibrillator  (crt-d)  01/24/2013  . A-v cardiac pacemaker insertion  12/05    Biventricular pacemaker/AICD  . Abdominal hysterectomy  1990/92    Initial partial hysterectomy followed by BSO  . Tubal ligation  1980's  . Cardiac catheterization    . Colonoscopy with esophagogastroduodenoscopy (egd) N/A 06/10/2013    Procedure: COLONOSCOPY WITH ESOPHAGOGASTRODUODENOSCOPY (EGD);  Surgeon: Rogene Houston, MD;  Location: [facility];  Service: Endoscopy;  Laterality: N/A;  925  . Tee without cardioversion N/A 06/20/2013    Procedure: TRANSESOPHAGEAL ECHOCARDIOGRAM (TEE);  Surgeon: Dorothy Spark, MD;  Location: [facility];  Service: Cardiovascular;  Laterality: N/A;  . Cardioversion N/A 06/20/2013    Procedure: CARDIOVERSION;  Surgeon: Dorothy Spark, MD;  Location: [facility] END[facility];  Service: Cardiovascular;  Laterality: N/A;     Family History  Problem Relation Age of Onset  . Hypertension Mother   . Diabetes Mother   . Coronary artery disease Father   . Diabetes Brother   . Hypertension Brother   . Lung cancer Brother   . Arthritis Other   . Diabetes Other   . Heart disease Other     female < 61     History   Social History  . Marital Status: Married    Spouse Name: N/A    Number of Children: N/A  . Years of Education: N/A   Occupational History  . Not on file.    Social History Main Topics  . Smoking status: Former Smoker -- 12 years    Types: Cigarettes  . Smokeless tobacco: Never Used     Comment: 05/2013: Smokes an occasional cigarette.  Says that she doesn't inhale.  . Alcohol Use: No  . Drug Use: No  . Sexual Activity: Not Currently    Birth Control/ Protection: None   Other Topics Concern  . Not on file  Social History Narrative   Married, lives in [place] with spouse. Retired Secretary/administrator.      BP 131/71  Pulse 73  Ht 5\' 5"  (1.651 m)  Wt 161 lb 1.6 oz (73.074 kg)  BMI 26.81 kg/m2  Physical Exam:  stable appearing 68 yo woman, NAD HEENT: Unremarkable Neck:  No JVD, no thyromegally Back:  No CVA tenderness Lungs:  Clear with no wheezes HEART:  Regular rate rhythm, no murmurs, no rubs, no clicks Abd:  soft, positive bowel sounds, no organomegally, no rebound, no guarding Ext:  2 plus pulses, no edema, no cyanosis, no clubbing Skin:  No rashes no nodules Neuro:  CN II through XII intact, motor grossly intact   DEVICE  Normal device function.  See PaceArt for details.   Assess/Plan:

## 2013-09-30 NOTE — Assessment & Plan Note (Signed)
Her symptoms are currently class 2. She will continue her current meds and maintain a low sodium diet.

## 2013-09-30 NOTE — Assessment & Plan Note (Signed)
She has maintained NSR on amiodarone. She will continue 200 mg daily and I will see her back in 9 months with plans to reduce her dose further at that time.

## 2013-09-30 NOTE — Assessment & Plan Note (Signed)
Interogation of her [employer] BiV ICD demonstrates normal function. Will recheck in several months.

## 2013-10-10 NOTE — Progress Notes (Signed)
HPI: [name] is a 68 year old patient of Dr. Lovena Le we are following for ongoing assessment and management of nonischemic cardiomyopathy, status post by the ICD insertion, . The patient was last seen by Dr. Lovena Le in July of 2014, with history of PAF status post DC CV. She was continued on maximum medical therapy. He remained in normal sinus rhythm when he saw her last her.    On last visit patient was admitted in December of 2014 with complaints of atrial fibrillation, with acute on chronic systolic heart failure. Underwent TEE cardioversion and amiodarone loading. She is also diuresis with IV Lasix. She also had asymptomatic runs of ventricular tachycardia. TEE cardioversion with successful for return to normal sinus rhythm. The patient remained on Coumadin therapy.  She is doing very well. She is without complaint of bleeding issue, rapid HR, DOE or fatigue. She is medically complaint. She was seen by Dr. Lovena Le 11 days ago for annual visit.    No Known Allergies  Current Outpatient Prescriptions  Medication Sig Dispense Refill  . amiodarone (PACERONE) 200 MG tablet Take 1 tablet (200 mg total) by mouth daily.  90 tablet  3  . carvedilol (COREG) 25 MG tablet Take 25 mg by mouth 2 (two) times daily.        . digoxin (LANOXIN) 0.125 MG tablet Take 0.5 tablets (0.0625 mg total) by mouth daily.  30 tablet  3  . furosemide (LASIX) 80 MG tablet Take 80 mg by mouth 2 (two) times daily.       Marland Kitchen HYDROcodone-acetaminophen (NORCO) 10-325 MG per tablet Take 1 tablet by mouth 3 (three) times daily as needed for pain (for pain).      Marland Kitchen lisinopril (PRINIVIL,ZESTRIL) 10 MG tablet Take 0.5 tablets (5 mg total) by mouth daily.  30 tablet  6  . metFORMIN (GLUCOPHAGE) 1000 MG tablet Take 1 tablet (1,000 mg total) by mouth 2 (two) times daily. HOLD for today, then resume usual dose tomorrow, 01/26/2013.      . metoCLOPramide (REGLAN) 5 MG tablet Take 5 mg by mouth 4 (four) times daily.      Marland Kitchen  oxyCODONE-acetaminophen (PERCOCET) 10-325 MG per tablet Take 1 tablet by mouth 4 (four) times daily as needed (for severe pain).       . polyethylene glycol powder (GLYCOLAX/MIRALAX) powder Take 17 g by mouth daily as needed (constipation).       . potassium chloride SA (K-DUR,KLOR-CON) 20 MEQ tablet Take 1 tablet (20 mEq total) by mouth daily.  30 tablet  6  . pravastatin (PRAVACHOL) 40 MG tablet Take 80 mg by mouth at bedtime.       Marland Kitchen warfarin (COUMADIN) 5 MG tablet Take 2.5 mg by mouth every evening.       No current facility-administered medications for this visit.    Past Medical History  Diagnosis Date  . Cardiomyopathy, nonischemic     a. 1999 nl cath;  b. 12/05 [employer] [place];  c. 10/2005 ICD extraction 2/2 enterococcus bacteremia and Veg on RV lead;  c. 05/2008 low risk Myoview (scarring w/ some evidence of inf ischemia);  d. 11/2012 Echo: EF 15-20%;  e. 01/2013 s/p MDT [facility] CRT D, ser # [id number];  f. 05/2013 Echo: EF 15%.  . Enterococcal infection     a. 10/2005 - AICD-explanted  . Pulmonary embolism     a. 07/2005 after total right hip arthroplasty  . Hilar density     a. infrahilar mass/adenopathy on CT scan  5/07; subsequently  resolved  . LBBB (left bundle branch block)   . GERD (gastroesophageal reflux disease)   . Hyperlipidemia   . Hypertension   . Tobacco abuse     a. discontinued in 1997, and then resumed  . Urinary incontinence   . Anemia     a. mild/chronic  . Villous adenoma of colon     a. tubovillous adenomatous polyp with focal high grade dysplasia; presented with hematochezia - followed by Dr. Laural Golden.  . CKD (chronic kidney disease), stage III     creatinin-1.44 in 1/09; 1.51 in 1/10  . Implantable cardioverter-defibrillator-CRT- Mdt     a.  01/2013 s/p MDT [place] [facility] CRT D, ser # [id number]  . Chronic systolic CHF (congestive heart failure)     a. 11/2012 Echo: EF 15-20%;  b. 05/2013 TEE EF 15%.  . Pneumonia 07/2005  . Obstructive sleep apnea     a. mild-did  not tolerate CPAP (01/24/2013)  . Type II diabetes mellitus   . History of blood transfusion   . Degenerative joint disease     of knees, shoulder, and hips  . PAF (paroxysmal atrial fibrillation)     a. 07/2012 s/p TEE/DCCV;  b. chronic coumadin;  c. 05/2013 Recurrent Afib->TEE/DCCV and amio initiation.    Past Surgical History  Procedure Laterality Date  . Pacemaker removal  11/18/05    Enterococcal infection  . Total hip arthroplasty Right 07/2005  . Knee arthroscopy Right 1980's?  . Colonoscopy w/ polypectomy  2009  . Cardioversion N/A 08/20/2012    Procedure: TEE GUIDED CARDIOVERSION;  Surgeon: Yehuda Savannah, MD;  Location: [facility];  Service: Cardiovascular;  Laterality: N/A;  To be done @ bedside  . Tee without cardioversion N/A 08/20/2012    Procedure: TRANSESOPHAGEAL ECHOCARDIOGRAM (TEE);  Surgeon: Yehuda Savannah, MD;  Location: [facility];  Service: Cardiovascular;  Laterality: N/A;  . Bi-ventricular implantable cardioverter defibrillator  (crt-d)  01/24/2013  . A-v cardiac pacemaker insertion  12/05    Biventricular pacemaker/AICD  . Abdominal hysterectomy  1990/92    Initial partial hysterectomy followed by BSO  . Tubal ligation  1980's  . Cardiac catheterization    . Colonoscopy with esophagogastroduodenoscopy (egd) N/A 06/10/2013    Procedure: COLONOSCOPY WITH ESOPHAGOGASTRODUODENOSCOPY (EGD);  Surgeon: Rogene Houston, MD;  Location: [facility];  Service: Endoscopy;  Laterality: N/A;  925  . Tee without cardioversion N/A 06/20/2013    Procedure: TRANSESOPHAGEAL ECHOCARDIOGRAM (TEE);  Surgeon: Dorothy Spark, MD;  Location: [facility];  Service: Cardiovascular;  Laterality: N/A;  . Cardioversion N/A 06/20/2013    Procedure: CARDIOVERSION;  Surgeon: Dorothy Spark, MD;  Location[facility] END[facility]COPY;  Service: Cardiovascular;  Laterality: N/A;    ROS:  Review of systems complete and found to be negative unless listed above  PHYSICAL EXAM There were no vitals taken for  this visit. General: Well developed, well nourished, in no acute distress Head: Eyes PERRLA, No xanthomas.   Normal cephalic and atramatic  Lungs: Clear bilaterally to auscultation and percussion. Heart: HRRR S1 S2, without MRG.  Pulses are 2+ & equal.            No carotid bruit. No JVD.  No abdominal bruits. No femoral bruits. Abdomen: Bowel sounds are positive, abdomen soft and non-tender without masses or  Hernia's noted. Msk:  Back normal, uses walker for ambulation.  Normal strength and tone for age. Extremities: No clubbing, cyanosis or edema.  DP +1 Neuro: Alert and oriented  X 3. Psych:  Good affect, responds appropriately.  ASSESSMENT AND PLAN

## 2013-10-11 ENCOUNTER — Ambulatory Visit (INDEPENDENT_AMBULATORY_CARE_PROVIDER_SITE_OTHER): Payer: Commercial Managed Care - HMO | Admitting: Adult Health

## 2013-10-11 ENCOUNTER — Encounter: Payer: Self-pay | Admitting: *Deleted

## 2013-10-11 ENCOUNTER — Encounter: Payer: Self-pay | Admitting: Adult Health

## 2013-10-11 VITALS — BP 117/68 | HR 83 | Ht 65.0 in | Wt 155.0 lb

## 2013-10-11 DIAGNOSIS — I5022 Chronic systolic (congestive) heart failure: Secondary | ICD-10-CM

## 2013-10-11 DIAGNOSIS — I509 Heart failure, unspecified: Secondary | ICD-10-CM

## 2013-10-11 DIAGNOSIS — Z95810 Presence of automatic (implantable) cardiac defibrillator: Secondary | ICD-10-CM

## 2013-10-11 DIAGNOSIS — I10 Essential (primary) hypertension: Secondary | ICD-10-CM

## 2013-10-11 NOTE — Patient Instructions (Signed)
Your physician recommends that you schedule a follow-up appointment in: 9 months with Dr Virgina Jock will receive a reminder letter two months in advance reminding you to call and schedule your appointment. If you don't receive this letter, please contact our office.  Your physician recommends that you continue on your current medications as directed. Please refer to the Current Medication list given to you today.

## 2013-10-11 NOTE — Progress Notes (Deleted)
Name: [name]    DOB: [birth year]  Age: 68 y.o.  MR#: [id number]       PCP:  Robert Bellow, MD      Insurance: Payor: [employer] MEDICARE / Plan: [place] [employer] / Product Type: *No Product type* /   CC:    Chief Complaint  Patient presents with  . Cardiomyopathy  . Atrial Fibrillation    VS Filed Vitals:   10/11/13 1415  BP: 117/68  Pulse: 83  Height: 5\' 5"  (1.651 m)  Weight: 155 lb (70.308 kg)    Weights Current Weight  10/11/13 155 lb (70.308 kg)  09/30/13 161 lb 1.6 oz (73.074 kg)  06/30/13 153 lb (69.4 kg)    Blood Pressure  BP Readings from Last 3 Encounters:  10/11/13 117/68  09/30/13 131/71  06/30/13 123/73     Admit date:  (Not on file) Last encounter with RMR:  06/29/2013   Allergy Review of patient's allergies indicates no known allergies.  Current Outpatient Prescriptions  Medication Sig Dispense Refill  . amiodarone (PACERONE) 200 MG tablet Take 1 tablet (200 mg total) by mouth daily.  90 tablet  3  . carvedilol (COREG) 25 MG tablet Take 25 mg by mouth 2 (two) times daily.        . digoxin (LANOXIN) 0.125 MG tablet Take 0.5 tablets (0.0625 mg total) by mouth daily.  30 tablet  3  . furosemide (LASIX) 80 MG tablet Take 80 mg by mouth 2 (two) times daily.       Marland Kitchen HYDROcodone-acetaminophen (NORCO) 10-325 MG per tablet Take 1 tablet by mouth 3 (three) times daily as needed for pain (for pain).      Marland Kitchen lisinopril (PRINIVIL,ZESTRIL) 10 MG tablet Take 0.5 tablets (5 mg total) by mouth daily.  30 tablet  6  . metFORMIN (GLUCOPHAGE) 1000 MG tablet Take 1 tablet (1,000 mg total) by mouth 2 (two) times daily. HOLD for today, then resume usual dose tomorrow, 01/26/2013.      . metoCLOPramide (REGLAN) 5 MG tablet Take 5 mg by mouth 4 (four) times daily.      Marland Kitchen oxyCODONE-acetaminophen (PERCOCET) 10-325 MG per tablet Take 1 tablet by mouth 4 (four) times daily as needed (for severe pain).       . polyethylene glycol powder (GLYCOLAX/MIRALAX) powder Take 17 g by  mouth daily as needed (constipation).       . potassium chloride SA (K-DUR,KLOR-CON) 20 MEQ tablet Take 1 tablet (20 mEq total) by mouth daily.  30 tablet  6  . pravastatin (PRAVACHOL) 40 MG tablet Take 80 mg by mouth at bedtime.       Marland Kitchen warfarin (COUMADIN) 5 MG tablet Take 2.5 mg by mouth every evening.       No current facility-administered medications for this visit.    Discontinued Meds:   There are no discontinued medications.  Patient Active Problem List   Diagnosis Date Noted  . Tobacco abuse 06/22/2013  . Implantable cardioverter-defibrillator-CRT- Mdt   . Acute on chronic systolic heart failure 46/56/8127  . Atrial fibrillation 06/19/2013  . Chronic anticoagulation 08/19/2012  . Chronic systolic congestive heart failure, NYHA class 3 08/03/2012  . Cardiomyopathy, nonischemic   . Pulmonary embolism   . Diabetes mellitus, type 2   . GERD (gastroesophageal reflux disease)   . Tobacco abuse, in remission   . Anemia, normocytic normochromic   . Obstructive sleep apnea   . Degenerative joint disease   . Chronic kidney disease   .  LBBB 01/29/2009  . HYPERLIPIDEMIA 06/04/2007  . HYPERTENSION 06/04/2007    LABS    Component Value Date/Time   NA 143 06/22/2013 0525   NA 141 06/21/2013 0400   NA 143 06/20/2013 0520   K 3.9 06/22/2013 0525   K 3.7 06/21/2013 0400   K 3.7 06/20/2013 0520   CL 103 06/22/2013 0525   CL 101 06/21/2013 0400   CL 101 06/20/2013 0520   CO2 27 06/22/2013 0525   CO2 26 06/21/2013 0400   CO2 27 06/20/2013 0520   GLUCOSE 159* 06/22/2013 0525   GLUCOSE 136* 06/21/2013 0400   GLUCOSE 162* 06/20/2013 0520   BUN 26* 06/22/2013 0525   BUN 30* 06/21/2013 0400   BUN 31* 06/20/2013 0520   CREATININE 1.18* 06/22/2013 0525   CREATININE 1.35* 06/21/2013 0400   CREATININE 1.42* 06/20/2013 0520   CREATININE 1.17* 01/20/2013 1010   CREATININE 1.56* 08/25/2012 0915   CREATININE 1.20* 07/22/2011 1011   CALCIUM 8.9 06/22/2013 0525   CALCIUM 8.8 06/21/2013  0400   CALCIUM 9.2 06/20/2013 0520   GFRNONAA 46* 06/22/2013 0525   GFRNONAA 39* 06/21/2013 0400   GFRNONAA 37* 06/20/2013 0520   GFRAA 53* 06/22/2013 0525   GFRAA 45* 06/21/2013 0400   GFRAA 43* 06/20/2013 0520   CMP     Component Value Date/Time   NA 143 06/22/2013 0525   K 3.9 06/22/2013 0525   CL 103 06/22/2013 0525   CO2 27 06/22/2013 0525   GLUCOSE 159* 06/22/2013 0525   BUN 26* 06/22/2013 0525   CREATININE 1.18* 06/22/2013 0525   CREATININE 1.17* 01/20/2013 1010   CALCIUM 8.9 06/22/2013 0525   PROT 6.3 06/20/2013 0520   ALBUMIN 3.2* 06/20/2013 0520   AST 8 06/20/2013 0520   ALT 7 06/20/2013 0520   ALKPHOS 57 06/20/2013 0520   BILITOT 0.9 06/20/2013 0520   GFRNONAA 46* 06/22/2013 0525   GFRAA 53* 06/22/2013 0525       Component Value Date/Time   WBC 7.2 06/20/2013 0520   WBC 8.1 06/19/2013 0804   WBC 6.6 06/18/2013 1951   HGB 9.8* 06/20/2013 0520   HGB 10.7* 06/19/2013 0804   HGB 9.7* 06/18/2013 1951   HCT 30.2* 06/20/2013 0520   HCT 32.6* 06/19/2013 0804   HCT 30.2* 06/18/2013 1951   MCV 92.9 06/20/2013 0520   MCV 92.1 06/19/2013 0804   MCV 93.2 06/18/2013 1951    Lipid Panel     Component Value Date/Time   CHOL 171 05/27/2010   TRIG 154 05/27/2010   HDL 38 05/27/2010   CHOLHDL 7.7 Ratio 07/16/2007 0000   VLDL 64* 07/16/2007 0000   LDLCALC 102 05/27/2010    ABG    Component Value Date/Time   PHART 7.237* 04/09/2008 2015   PCO2ART 43.7 04/09/2008 2015   PO2ART 315.0* 04/09/2008 2015   HCO3 17.9* 04/09/2008 2015   TCO2 21 04/09/2008 2037   ACIDBASEDEF 8.1* 04/09/2008 2015   O2SAT 99.5 04/09/2008 2015     Lab Results  Component Value Date   TSH 1.035 06/19/2013   BNP (last 3 results)  Recent Labs  06/18/13 1951 06/19/13 0804  PROBNP 18063.0* 23379.0*   Cardiac Panel (last 3 results) No results found for this basename: CKTOTAL, CKMB, TROPONINI, RELINDX,  in the last 72 hours  Iron/TIBC/Ferritin    Component Value Date/Time   IRON 51  07/22/2011 1011   TIBC 294 07/22/2011 1011   FERRITIN 71 07/22/2011 1011     EKG Orders placed in  visit on 09/30/13  . EKG 12-LEAD     Prior Assessment and Plan Problem List as of 10/11/2013     Cardiovascular and Mediastinum   HYPERTENSION   Last Assessment & Plan   06/29/2013 Office Visit Written 06/29/2013  3:01 PM by Lendon Colonel, NP     BP is well controled. She is to avoid salty foods and to continue daily wts     LBBB   Cardiomyopathy, nonischemic   Last Assessment & Plan   12/29/2012 Office Visit Written 12/29/2012  4:54 PM by Lendon Colonel, NP     She remains on optimal medical management with ACE, BB, diuretic, and digoxin. Her systolic function has worsened from 20% to 10-15% per echo in 11/2012. I am going to refer her to Dr. Lovena Le for discussion of possibility of ICD implantation, despite prior history of infective endocarditis in 2007 in the setting of septic emboli after knee effusion aspiration at that time.  I have discussed with her the likelihood of sudden cardiac death with continued severe systolic dysfunction. She would like to talk to Dr. Lovena Le about possible implantation.Will defer to Dr.Taylor for his recommendations.     Pulmonary embolism   Last Assessment & Plan   01/14/2011 Office Visit Written 01/14/2011  8:17 PM by Yehuda Savannah, MD     She continues to remain free of thromboembolic disease without requiring full anticoagulation.    Chronic systolic congestive heart failure, NYHA class 3   Last Assessment & Plan   01/05/2013 Office Visit Written 01/05/2013  2:18 PM by Evans Lance, MD     I discussed the treatment options with the patient. She has a class I indication for biventricular ICD with left bundle branch block and a QRS duration of greater than 150 ms, severe left ventricular dysfunction with an ejection fraction of 15%, despite maximal medical therapy, with her left ventricular dysfunction been present for over 6 years. I discussed the risk,  goals, benefits, and expectations of biventricular ICD implantation with the patient and she wishes to proceed. This be scheduled early as possible pending at time.    Acute on chronic systolic heart failure   Last Assessment & Plan   09/30/2013 Office Visit Written 09/30/2013  9:00 AM by Evans Lance, MD     Her symptoms are currently class 2. She will continue her current meds and maintain a low sodium diet.    Atrial fibrillation   Last Assessment & Plan   09/30/2013 Office Visit Written 09/30/2013  9:01 AM by Evans Lance, MD     She has maintained NSR on amiodarone. She will continue 200 mg daily and I will see her back in 9 months with plans to reduce her dose further at that time.      Respiratory   Obstructive sleep apnea     Digestive   GERD (gastroesophageal reflux disease)     Endocrine   Diabetes mellitus, type 2   Last Assessment & Plan   05/10/2012 Office Visit Written 05/10/2012 12:22 PM by Yehuda Savannah, MD     Patient reports CBGs less than 150, but is unaware of any recent A1c determination.  Most recent value available to me was 6.8 in 11/2010, indicating good control at that time.      Musculoskeletal and Integument   Degenerative joint disease   Last Assessment & Plan   07/14/2011 Office Visit Edited 07/18/2011  7:12 PM by Yehuda Savannah, MD  Degenerative joint disease of the hip continues to significantly impair her quality of life, but Lilly appears to have come to an accommodation with her disease.  She uses a walker with adequate mobility and does not wish to reconsider orthopaedic surgery.  No falls have occurred for at least the past 6 months.      Genitourinary   Chronic kidney disease   Last Assessment & Plan   05/10/2012 Office Visit Written 05/10/2012 12:21 PM by Yehuda Savannah, MD     Renal function has improved since 2009 and is now near normal.      Other   [place]   07/14/2011 Office Visit Written  07/14/2011 12:59 PM by Yehuda Savannah, MD     Most recent lipid profile in 11/2010 showed very adequate values, especially in light of the absence of known vascular disease.    Tobacco abuse, in remission   Last Assessment & Plan   07/14/2011 Office Visit Written 07/14/2011  1:01 PM by Yehuda Savannah, MD     Patient reports minimal cigarette smoking since 2010.  She is encouraged to avoid tobacco use entirely.    Anemia, normocytic normochromic   Last Assessment & Plan   09/20/2012 Office Visit Written 09/20/2012  2:03 PM by Lendon Colonel, NP     Recent labs have been completed within the last month she remains stable.    Chronic anticoagulation   Implantable cardioverter-defibrillator-CRT- Mdt   Last Assessment & Plan   09/30/2013 Office Visit Written 09/30/2013  9:01 AM by Evans Lance, MD     Interogation of her Medtronic BiV ICD demonstrates normal function. Will recheck in several months.    Tobacco abuse       Imaging: No results found.

## 2013-10-11 NOTE — Assessment & Plan Note (Signed)
She has no evidence of fluid overload. She is medically and diet compliance. Will continue current medication regimen. She will be seen in 9 months.

## 2013-10-11 NOTE — Assessment & Plan Note (Signed)
Followed by Dr. Lovena Le. Interrogation per schedule.

## 2013-10-11 NOTE — Assessment & Plan Note (Signed)
Blood pressure is well controlled currently. Labs are completed by Dr. Karie Kirks. Continue current medication regimen.

## 2013-12-27 ENCOUNTER — Encounter (INDEPENDENT_AMBULATORY_CARE_PROVIDER_SITE_OTHER): Payer: Self-pay | Admitting: *Deleted

## 2013-12-29 ENCOUNTER — Other Ambulatory Visit (INDEPENDENT_AMBULATORY_CARE_PROVIDER_SITE_OTHER): Payer: Self-pay | Admitting: Internal Medicine

## 2013-12-29 DIAGNOSIS — K769 Liver disease, unspecified: Secondary | ICD-10-CM

## 2014-01-02 ENCOUNTER — Ambulatory Visit (INDEPENDENT_AMBULATORY_CARE_PROVIDER_SITE_OTHER): Payer: Commercial Managed Care - HMO | Admitting: *Deleted

## 2014-01-02 DIAGNOSIS — I5023 Acute on chronic systolic (congestive) heart failure: Secondary | ICD-10-CM

## 2014-01-02 DIAGNOSIS — I428 Other cardiomyopathies: Secondary | ICD-10-CM

## 2014-01-03 ENCOUNTER — Telehealth: Payer: Self-pay | Admitting: Cardiology

## 2014-01-03 NOTE — Telephone Encounter (Signed)
Spoke with pt and reminded pt of remote transmission that is due yesterday 01-02-14. Pt verbalized understanding.

## 2014-01-03 NOTE — Progress Notes (Signed)
Remote ICD transmission.   

## 2014-01-04 LAB — MDC_IDC_ENUM_SESS_TYPE_REMOTE
Battery Remaining Longevity: 85 mo
Battery Voltage: 3 V
Brady Statistic AP VP Percent: 88.57 %
Brady Statistic AS VS Percent: 0.14 %
Brady Statistic RA Percent Paced: 90.32 %
Date Time Interrogation Session: 20150714160843
HIGH POWER IMPEDANCE MEASURED VALUE: 247 Ohm
HighPow Impedance: 46 Ohm
Lead Channel Impedance Value: 361 Ohm
Lead Channel Impedance Value: 399 Ohm
Lead Channel Impedance Value: 608 Ohm
Lead Channel Pacing Threshold Amplitude: 0.75 V
Lead Channel Pacing Threshold Amplitude: 1.625 V
Lead Channel Pacing Threshold Pulse Width: 0.4 ms
Lead Channel Pacing Threshold Pulse Width: 0.4 ms
Lead Channel Pacing Threshold Pulse Width: 0.4 ms
Lead Channel Sensing Intrinsic Amplitude: 0.75 mV
Lead Channel Sensing Intrinsic Amplitude: 0.75 mV
Lead Channel Sensing Intrinsic Amplitude: 22 mV
Lead Channel Sensing Intrinsic Amplitude: 22 mV
Lead Channel Setting Pacing Amplitude: 1.5 V
Lead Channel Setting Pacing Amplitude: 2 V
Lead Channel Setting Pacing Amplitude: 2.75 V
Lead Channel Setting Pacing Pulse Width: 0.4 ms
Lead Channel Setting Sensing Sensitivity: 0.3 mV
MDC IDC MSMT LEADCHNL LV IMPEDANCE VALUE: 399 Ohm
MDC IDC MSMT LEADCHNL RA PACING THRESHOLD AMPLITUDE: 0.75 V
MDC IDC MSMT LEADCHNL RV IMPEDANCE VALUE: 532 Ohm
MDC IDC SET LEADCHNL RV PACING PULSEWIDTH: 0.4 ms
MDC IDC SET ZONE DETECTION INTERVAL: 350 ms
MDC IDC SET ZONE DETECTION INTERVAL: 360 ms
MDC IDC STAT BRADY AP VS PERCENT: 1.75 %
MDC IDC STAT BRADY AS VP PERCENT: 9.54 %
MDC IDC STAT BRADY RV PERCENT PACED: 64.25 %
Zone Setting Detection Interval: 300 ms
Zone Setting Detection Interval: 350 ms

## 2014-01-10 ENCOUNTER — Encounter (HOSPITAL_COMMUNITY): Payer: Self-pay | Admitting: Emergency Medicine

## 2014-01-10 ENCOUNTER — Emergency Department (HOSPITAL_COMMUNITY): Payer: Medicare HMO

## 2014-01-10 ENCOUNTER — Inpatient Hospital Stay (HOSPITAL_COMMUNITY)
Admission: EM | Admit: 2014-01-10 | Discharge: 2014-01-11 | DRG: 292 | Disposition: A | Discharge status: Home / Self Care | Payer: Medicare HMO | Attending: Internal Medicine | Admitting: Internal Medicine

## 2014-01-10 DIAGNOSIS — D649 Anemia, unspecified: Secondary | ICD-10-CM | POA: Diagnosis present

## 2014-01-10 DIAGNOSIS — F17201 Nicotine dependence, unspecified, in remission: Secondary | ICD-10-CM

## 2014-01-10 DIAGNOSIS — I2699 Other pulmonary embolism without acute cor pulmonale: Secondary | ICD-10-CM

## 2014-01-10 DIAGNOSIS — I509 Heart failure, unspecified: Secondary | ICD-10-CM | POA: Diagnosis present

## 2014-01-10 DIAGNOSIS — M1990 Unspecified osteoarthritis, unspecified site: Secondary | ICD-10-CM | POA: Diagnosis present

## 2014-01-10 DIAGNOSIS — N183 Chronic kidney disease, stage 3 unspecified: Secondary | ICD-10-CM | POA: Diagnosis present

## 2014-01-10 DIAGNOSIS — Z87891 Personal history of nicotine dependence: Secondary | ICD-10-CM

## 2014-01-10 DIAGNOSIS — E1165 Type 2 diabetes mellitus with hyperglycemia: Secondary | ICD-10-CM

## 2014-01-10 DIAGNOSIS — Z720 Tobacco use: Secondary | ICD-10-CM

## 2014-01-10 DIAGNOSIS — Z7901 Long term (current) use of anticoagulants: Secondary | ICD-10-CM

## 2014-01-10 DIAGNOSIS — I5023 Acute on chronic systolic (congestive) heart failure: Secondary | ICD-10-CM | POA: Diagnosis present

## 2014-01-10 DIAGNOSIS — I447 Left bundle-branch block, unspecified: Secondary | ICD-10-CM

## 2014-01-10 DIAGNOSIS — Z86711 Personal history of pulmonary embolism: Secondary | ICD-10-CM

## 2014-01-10 DIAGNOSIS — Z833 Family history of diabetes mellitus: Secondary | ICD-10-CM

## 2014-01-10 DIAGNOSIS — Z96649 Presence of unspecified artificial hip joint: Secondary | ICD-10-CM | POA: Diagnosis not present

## 2014-01-10 DIAGNOSIS — Z801 Family history of malignant neoplasm of trachea, bronchus and lung: Secondary | ICD-10-CM | POA: Diagnosis not present

## 2014-01-10 DIAGNOSIS — I428 Other cardiomyopathies: Secondary | ICD-10-CM | POA: Diagnosis present

## 2014-01-10 DIAGNOSIS — IMO0002 Reserved for concepts with insufficient information to code with codable children: Secondary | ICD-10-CM | POA: Diagnosis present

## 2014-01-10 DIAGNOSIS — E785 Hyperlipidemia, unspecified: Secondary | ICD-10-CM | POA: Diagnosis present

## 2014-01-10 DIAGNOSIS — Z8249 Family history of ischemic heart disease and other diseases of the circulatory system: Secondary | ICD-10-CM

## 2014-01-10 DIAGNOSIS — D638 Anemia in other chronic diseases classified elsewhere: Secondary | ICD-10-CM | POA: Diagnosis present

## 2014-01-10 DIAGNOSIS — Z95810 Presence of automatic (implantable) cardiac defibrillator: Secondary | ICD-10-CM | POA: Diagnosis not present

## 2014-01-10 DIAGNOSIS — I129 Hypertensive chronic kidney disease with stage 1 through stage 4 chronic kidney disease, or unspecified chronic kidney disease: Secondary | ICD-10-CM | POA: Diagnosis present

## 2014-01-10 DIAGNOSIS — K219 Gastro-esophageal reflux disease without esophagitis: Secondary | ICD-10-CM | POA: Diagnosis present

## 2014-01-10 DIAGNOSIS — G4733 Obstructive sleep apnea (adult) (pediatric): Secondary | ICD-10-CM | POA: Diagnosis present

## 2014-01-10 DIAGNOSIS — I4891 Unspecified atrial fibrillation: Secondary | ICD-10-CM | POA: Diagnosis present

## 2014-01-10 DIAGNOSIS — E119 Type 2 diabetes mellitus without complications: Secondary | ICD-10-CM | POA: Diagnosis present

## 2014-01-10 DIAGNOSIS — N289 Disorder of kidney and ureter, unspecified: Secondary | ICD-10-CM

## 2014-01-10 DIAGNOSIS — I10 Essential (primary) hypertension: Secondary | ICD-10-CM | POA: Diagnosis present

## 2014-01-10 DIAGNOSIS — R0602 Shortness of breath: Secondary | ICD-10-CM | POA: Diagnosis present

## 2014-01-10 DIAGNOSIS — I5022 Chronic systolic (congestive) heart failure: Secondary | ICD-10-CM

## 2014-01-10 DIAGNOSIS — E1129 Type 2 diabetes mellitus with other diabetic kidney complication: Secondary | ICD-10-CM | POA: Diagnosis present

## 2014-01-10 LAB — CBC WITH DIFFERENTIAL/PLATELET
BASOS ABS: 0 10*3/uL (ref 0.0–0.1)
BASOS PCT: 0 % (ref 0–1)
EOS ABS: 0.1 10*3/uL (ref 0.0–0.7)
EOS PCT: 1 % (ref 0–5)
HCT: 27.7 % — ABNORMAL LOW (ref 36.0–46.0)
Hemoglobin: 9.2 g/dL — ABNORMAL LOW (ref 12.0–15.0)
Lymphocytes Relative: 7 % — ABNORMAL LOW (ref 12–46)
Lymphs Abs: 0.7 10*3/uL (ref 0.7–4.0)
MCH: 31.7 pg (ref 26.0–34.0)
MCHC: 33.2 g/dL (ref 30.0–36.0)
MCV: 95.5 fL (ref 78.0–100.0)
Monocytes Absolute: 0.8 10*3/uL (ref 0.1–1.0)
Monocytes Relative: 7 % (ref 3–12)
NEUTROS PCT: 85 % — AB (ref 43–77)
Neutro Abs: 9.3 10*3/uL — ABNORMAL HIGH (ref 1.7–7.7)
PLATELETS: 215 10*3/uL (ref 150–400)
RBC: 2.9 MIL/uL — ABNORMAL LOW (ref 3.87–5.11)
RDW: 12.8 % (ref 11.5–15.5)
WBC: 10.8 10*3/uL — ABNORMAL HIGH (ref 4.0–10.5)

## 2014-01-10 LAB — BASIC METABOLIC PANEL
Anion gap: 13 (ref 5–15)
BUN: 31 mg/dL — ABNORMAL HIGH (ref 6–23)
CALCIUM: 9.6 mg/dL (ref 8.4–10.5)
CO2: 26 mEq/L (ref 19–32)
Chloride: 101 mEq/L (ref 96–112)
Creatinine, Ser: 1.54 mg/dL — ABNORMAL HIGH (ref 0.50–1.10)
GFR, EST AFRICAN AMERICAN: 38 mL/min — AB (ref >90)
GFR, EST NON AFRICAN AMERICAN: 33 mL/min — AB (ref >90)
Glucose, Bld: 376 mg/dL — ABNORMAL HIGH (ref 70–99)
POTASSIUM: 4.3 meq/L (ref 3.7–5.3)
SODIUM: 140 meq/L (ref 137–147)

## 2014-01-10 LAB — TROPONIN I
Troponin I: <0.3 ng/mL (ref <0.30)
Troponin I: <0.3 ng/mL (ref <0.30)

## 2014-01-10 LAB — GLUCOSE, CAPILLARY
GLUCOSE-CAPILLARY: 195 mg/dL — AB (ref 70–99)
Glucose-Capillary: 127 mg/dL — ABNORMAL HIGH (ref 70–99)

## 2014-01-10 LAB — PRO B NATRIURETIC PEPTIDE: PRO B NATRI PEPTIDE: 13590 pg/mL — AB (ref 0–125)

## 2014-01-10 LAB — CBG MONITORING, ED: GLUCOSE-CAPILLARY: 204 mg/dL — AB (ref 70–99)

## 2014-01-10 LAB — PROTIME-INR
INR: 2.1 — ABNORMAL HIGH (ref 0.00–1.49)
Prothrombin Time: 23.6 seconds — ABNORMAL HIGH (ref 11.6–15.2)

## 2014-01-10 MED ORDER — POTASSIUM CHLORIDE CRYS ER 20 MEQ PO TBCR
20.0000 meq | EXTENDED_RELEASE_TABLET | Freq: Every day | ORAL | Status: DC
  Administered 2014-01-10 – 2014-01-11 (×2): 20 meq via ORAL
  Filled 2014-01-10 (×2): qty 1

## 2014-01-10 MED ORDER — FUROSEMIDE 10 MG/ML IJ SOLN
80.0000 mg | Freq: Two times a day (BID) | INTRAMUSCULAR | Status: DC
  Administered 2014-01-10 – 2014-01-11 (×3): 80 mg via INTRAVENOUS
  Filled 2014-01-10 (×3): qty 8

## 2014-01-10 MED ORDER — CARVEDILOL 12.5 MG PO TABS
25.0000 mg | ORAL_TABLET | Freq: Two times a day (BID) | ORAL | Status: DC
  Administered 2014-01-10 – 2014-01-11 (×3): 25 mg via ORAL
  Filled 2014-01-10 (×5): qty 2

## 2014-01-10 MED ORDER — WARFARIN - PHARMACIST DOSING INPATIENT: Status: DC

## 2014-01-10 MED ORDER — INSULIN ASPART 100 UNIT/ML ~~LOC~~ SOLN
0.0000 [IU] | Freq: Three times a day (TID) | SUBCUTANEOUS | Status: DC
  Administered 2014-01-10: 2 [IU] via SUBCUTANEOUS
  Administered 2014-01-10: 5 [IU] via SUBCUTANEOUS

## 2014-01-10 MED ORDER — HYDROCODONE-ACETAMINOPHEN 10-325 MG PO TABS: 1.0000 | ORAL_TABLET | Freq: Three times a day (TID) | ORAL | Status: DC | PRN

## 2014-01-10 MED ORDER — INSULIN ASPART 100 UNIT/ML ~~LOC~~ SOLN: 0.0000 [IU] | Freq: Every day | SUBCUTANEOUS | Status: DC

## 2014-01-10 MED ORDER — WARFARIN SODIUM 2.5 MG PO TABS
2.5000 mg | ORAL_TABLET | Freq: Every evening | ORAL | Status: DC
  Administered 2014-01-10: 2.5 mg via ORAL
  Filled 2014-01-10 (×2): qty 1

## 2014-01-10 MED ORDER — AMIODARONE HCL 200 MG PO TABS
200.0000 mg | ORAL_TABLET | Freq: Every day | ORAL | Status: DC
  Administered 2014-01-10 – 2014-01-11 (×2): 200 mg via ORAL
  Filled 2014-01-10 (×3): qty 1

## 2014-01-10 MED ORDER — POLYETHYLENE GLYCOL 3350 17 G PO PACK: 17.0000 g | PACK | Freq: Every day | ORAL | Status: DC | PRN

## 2014-01-10 MED ORDER — LISINOPRIL 5 MG PO TABS
5.0000 mg | ORAL_TABLET | Freq: Every day | ORAL | Status: DC
  Administered 2014-01-10 – 2014-01-11 (×2): 5 mg via ORAL
  Filled 2014-01-10 (×2): qty 1

## 2014-01-10 MED ORDER — METOCLOPRAMIDE HCL 10 MG PO TABS
5.0000 mg | ORAL_TABLET | Freq: Four times a day (QID) | ORAL | Status: DC
  Administered 2014-01-10 – 2014-01-11 (×5): 5 mg via ORAL
  Filled 2014-01-10 (×5): qty 1

## 2014-01-10 MED ORDER — ONDANSETRON HCL 4 MG PO TABS: 4.0000 mg | ORAL_TABLET | Freq: Four times a day (QID) | ORAL | Status: DC | PRN

## 2014-01-10 MED ORDER — ONDANSETRON HCL 4 MG/2ML IJ SOLN: 4.0000 mg | Freq: Four times a day (QID) | INTRAMUSCULAR | Status: DC | PRN

## 2014-01-10 MED ORDER — METFORMIN HCL 500 MG PO TABS
1000.0000 mg | ORAL_TABLET | Freq: Two times a day (BID) | ORAL | Status: DC
  Administered 2014-01-10: 1000 mg via ORAL
  Filled 2014-01-10: qty 2

## 2014-01-10 MED ORDER — POLYETHYLENE GLYCOL 3350 17 GM/SCOOP PO POWD
17.0000 g | Freq: Every day | ORAL | Status: DC | PRN
  Filled 2014-01-10: qty 255

## 2014-01-10 MED ORDER — DIGOXIN 125 MCG PO TABS
0.0625 mg | ORAL_TABLET | Freq: Every day | ORAL | Status: DC
Start: 2014-01-10 — End: 2014-01-11
  Administered 2014-01-10 – 2014-01-11 (×2): 0.0625 mg via ORAL
  Filled 2014-01-10 (×2): qty 0.5
  Filled 2014-01-10: qty 1

## 2014-01-10 MED ORDER — WARFARIN - PHYSICIAN DOSING INPATIENT: Freq: Every day | Status: DC

## 2014-01-10 MED ORDER — FUROSEMIDE 10 MG/ML IJ SOLN
80.0000 mg | Freq: Once | INTRAMUSCULAR | Status: AC
  Administered 2014-01-10: 80 mg via INTRAVENOUS
  Filled 2014-01-10: qty 8

## 2014-01-10 MED ORDER — SODIUM CHLORIDE 0.9 % IJ SOLN
3.0000 mL | Freq: Two times a day (BID) | INTRAMUSCULAR | Status: DC
  Administered 2014-01-10 (×2): 3 mL via INTRAVENOUS
  Filled 2014-01-10: qty 3

## 2014-01-10 MED ORDER — SIMVASTATIN 20 MG PO TABS
20.0000 mg | ORAL_TABLET | Freq: Every day | ORAL | Status: DC
  Administered 2014-01-10: 20 mg via ORAL
  Filled 2014-01-10 (×2): qty 1

## 2014-01-10 NOTE — ED Provider Notes (Signed)
CSN: [id number]     Arrival date & time 01/10/14  0456 History   First MD Initiated Contact with Patient 01/10/14 0457     Chief Complaint  Patient presents with  . Shortness of Breath     (Consider location/radiation/quality/duration/timing/severity/associated sxs/prior Treatment) Patient is a 68 y.o. female presenting with shortness of breath. The history is provided by the patient.  Shortness of Breath She got up to go the bathroom and noted that she was much more short of breath than normal. There's been braced slight cough. She denies chest pain, heaviness, tightness, pressure. She denies fever, chills, sweats. Nothing makes her dyspnea worse and nothing makes it better. She denies nausea or vomiting. She states that yesterday, she felt like she didn't have her normal amount of energy but did not have any dyspnea. She does have history of CHF.  Past Medical History  Diagnosis Date  . Cardiomyopathy, nonischemic     a. 1999 nl cath;  b. 12/05 [facility] [facility];  c. 10/2005 ICD extraction 2/2 enterococcus bacteremia and Veg on RV lead;  c. 05/2008 low risk Myoview (scarring w/ some evidence of inf ischemia);  d. 11/2012 Echo: EF 15-20%;  e. 01/2013 s/p MDT [place] CRT D, ser # [id number];  f. 05/2013 Echo: EF 15%.  . Enterococcal infection     a. 10/2005 - AICD-explanted  . Pulmonary embolism     a. 07/2005 after total right hip arthroplasty  . Hilar density     a. infrahilar mass/adenopathy on CT scan 5/07; subsequently  resolved  . LBBB (left bundle branch block)   . GERD (gastroesophageal reflux disease)   . Hyperlipidemia   . Hypertension   . Tobacco abuse     a. discontinued in 1997, and then resumed  . Urinary incontinence   . Anemia     a. mild/chronic  . Villous adenoma of colon     a. tubovillous adenomatous polyp with focal high grade dysplasia; presented with hematochezia - followed by Dr. Laural Golden.  . CKD (chronic kidney disease), stage III     creatinin-1.44 in 1/09; 1.51 in  1/10  . Implantable cardioverter-defibrillator-CRT- Mdt     a.  01/2013 s/p MDT [place] CRT D, ser # [id number]  . Chronic systolic CHF (congestive heart failure)     a. 11/2012 Echo: EF 15-20%;  b. 05/2013 TEE EF 15%.  . Pneumonia 07/2005  . Obstructive sleep apnea     a. mild-did not tolerate CPAP (01/24/2013)  . Type II diabetes mellitus   . History of blood transfusion   . Degenerative joint disease     of knees, shoulder, and hips  . PAF (paroxysmal atrial fibrillation)     a. 07/2012 s/p TEE/DCCV;  b. chronic coumadin;  c. 05/2013 Recurrent Afib->TEE/DCCV and amio initiation.   Past Surgical History  Procedure Laterality Date  . Pacemaker removal  11/18/05    Enterococcal infection  . Total hip arthroplasty Right 07/2005  . Knee arthroscopy Right 1980's?  . Colonoscopy w/ polypectomy  2009  . Cardioversion N/A 08/20/2012    Procedure: TEE GUIDED CARDIOVERSION;  Surgeon: Yehuda Savannah, MD;  Location: [facility];  Service: Cardiovascular;  Laterality: N/A;  To be done @ bedside  . Tee without cardioversion N/A 08/20/2012    Procedure: TRANSESOPHAGEAL ECHOCARDIOGRAM (TEE);  Surgeon: Yehuda Savannah, MD;  Location: [facility];  Service: Cardiovascular;  Laterality: N/A;  . Bi-ventricular implantable cardioverter defibrillator  (crt-d)  01/24/2013  . A-v cardiac pacemaker  insertion  12/05    Biventricular pacemaker/AICD  . Abdominal hysterectomy  1990/92    Initial partial hysterectomy followed by BSO  . Tubal ligation  1980's  . Cardiac catheterization    . Colonoscopy with esophagogastroduodenoscopy (egd) N/A 06/10/2013    Procedure: COLONOSCOPY WITH ESOPHAGOGASTRODUODENOSCOPY (EGD);  Surgeon: Rogene Houston, MD;  Location: [facility];  Service: Endoscopy;  Laterality: N/A;  925  . Tee without cardioversion N/A 06/20/2013    Procedure: TRANSESOPHAGEAL ECHOCARDIOGRAM (TEE);  Surgeon: Dorothy Spark, MD;  Location: [facility];  Service: Cardiovascular;  Laterality: N/A;  .  Cardioversion N/A 06/20/2013    Procedure: CARDIOVERSION;  Surgeon: Dorothy Spark, MD;  Location[facility];  Service: Cardiovascular;  Laterality: N/A;   Family History  Problem Relation Age of Onset  . Hypertension Mother   . Diabetes Mother   . Coronary artery disease Father   . Diabetes Brother   . Hypertension Brother   . Lung cancer Brother   . Arthritis Other   . Diabetes Other   . Heart disease Other     female < 55   History  Substance Use Topics  . Smoking status: Former Smoker -- 12 years    Types: Cigarettes  . Smokeless tobacco: Never Used     Comment: 05/2013: Smokes an occasional cigarette.  Says that she doesn't inhale.  . Alcohol Use: No   OB History   Grav Para Term Preterm Abortions TAB SAB Ect Mult Living                 Review of Systems  Respiratory: Positive for shortness of breath.   All other systems reviewed and are negative.     Allergies  Review of patient's allergies indicates no known allergies.  Home Medications   Prior to Admission medications   Medication Sig Start Date End Date Taking? Authorizing Provider  amiodarone (PACERONE) 200 MG tablet Take 1 tablet (200 mg total) by mouth daily. 09/30/13  Yes Evans Lance, MD  carvedilol (COREG) 25 MG tablet Take 25 mg by mouth 2 (two) times daily.     Yes Historical Provider, MD  digoxin (LANOXIN) 0.125 MG tablet Take 0.5 tablets (0.0625 mg total) by mouth daily. 06/22/13  Yes Rogelia Mire, NP  furosemide (LASIX) 80 MG tablet Take 80 mg by mouth 2 (two) times daily.    Yes Historical Provider, MD  HYDROcodone-acetaminophen (NORCO) 10-325 MG per tablet Take 1 tablet by mouth 3 (three) times daily as needed for pain (for pain).   Yes Historical Provider, MD  lisinopril (PRINIVIL,ZESTRIL) 10 MG tablet Take 0.5 tablets (5 mg total) by mouth daily. 01/05/13  Yes Evans Lance, MD  metFORMIN (GLUCOPHAGE) 1000 MG tablet Take 1 tablet (1,000 mg total) by mouth 2 (two) times daily. HOLD  for today, then resume usual dose tomorrow, 01/26/2013. 01/25/13  Yes Brooke O Edmisten, PA-C  metoCLOPramide (REGLAN) 5 MG tablet Take 5 mg by mouth 4 (four) times daily.   Yes Historical Provider, MD  oxyCODONE-acetaminophen (PERCOCET) 10-325 MG per tablet Take 1 tablet by mouth 4 (four) times daily as needed (for severe pain).  06/07/13  Yes Historical Provider, MD  polyethylene glycol powder (GLYCOLAX/MIRALAX) powder Take 17 g by mouth daily as needed (constipation).  05/04/12  Yes Historical Provider, MD  potassium chloride SA (K-DUR,KLOR-CON) 20 MEQ tablet Take 1 tablet (20 mEq total) by mouth daily. 06/22/13  Yes Rogelia Mire, NP  pravastatin (PRAVACHOL) 40 MG tablet  Take 80 mg by mouth at bedtime.    Yes Historical Provider, MD  warfarin (COUMADIN) 5 MG tablet Take 2.5 mg by mouth every evening.   Yes Historical Provider, MD   BP 162/92  Pulse 84  Temp(Src) 98 F (36.7 C) (Oral)  Resp 24  Ht 5' (1.524 m)  Wt 160 lb (72.576 kg)  BMI 31.25 kg/m2  SpO2 94% Physical Exam  Nursing note and vitals reviewed.  68 year old female, resting comfortably and in no acute distress. Vital signs are significant for hypertension with blood pressure 162/92, and tachypnea with respiratory rate of 24. Oxygen saturation is 94%, which is normal. Head is normocephalic and atraumatic. PERRLA, EOMI. Oropharynx is clear. Neck is nontender and supple without adenopathy. JVD is present at 90. Back is nontender and there is no CVA tenderness. Lungs have rales two thirds of the way up. There are scattered expiratory wheezes which are felt to be due to CHF and not bronchospasm. Chest is nontender. Heart has regular rate and rhythm without murmur. Abdomen is soft, flat, nontender without masses or hepatosplenomegaly and peristalsis is normoactive. Extremities have no cyanosis or edema, full range of motion is present. Skin is warm and dry without rash. Neurologic: Mental status is normal, cranial nerves  are intact, there are no motor or sensory deficits.  ED Course  Procedures (including critical care time) Labs Review Results for orders placed during the hospital encounter of 01/10/14  CBC WITH DIFFERENTIAL      Result Value Ref Range   WBC 10.8 (*) 4.0 - 10.5 K/uL   RBC 2.90 (*) 3.87 - 5.11 MIL/uL   Hemoglobin 9.2 (*) 12.0 - 15.0 g/dL   HCT 27.7 (*) 36.0 - 46.0 %   MCV 95.5  78.0 - 100.0 fL   MCH 31.7  26.0 - 34.0 pg   MCHC 33.2  30.0 - 36.0 g/dL   RDW 12.8  11.5 - 15.5 %   Platelets 215  150 - 400 K/uL   Neutrophils Relative % 85 (*) 43 - 77 %   Neutro Abs 9.3 (*) 1.7 - 7.7 K/uL   Lymphocytes Relative 7 (*) 12 - 46 %   Lymphs Abs 0.7  0.7 - 4.0 K/uL   Monocytes Relative 7  3 - 12 %   Monocytes Absolute 0.8  0.1 - 1.0 K/uL   Eosinophils Relative 1  0 - 5 %   Eosinophils Absolute 0.1  0.0 - 0.7 K/uL   Basophils Relative 0  0 - 1 %   Basophils Absolute 0.0  0.0 - 0.1 K/uL  BASIC METABOLIC PANEL      Result Value Ref Range   Sodium 140  137 - 147 mEq/L   Potassium 4.3  3.7 - 5.3 mEq/L   Chloride 101  96 - 112 mEq/L   CO2 26  19 - 32 mEq/L   Glucose, Bld 376 (*) 70 - 99 mg/dL   BUN 31 (*) 6 - 23 mg/dL   Creatinine, Ser 1.54 (*) 0.50 - 1.10 mg/dL   Calcium 9.6  8.4 - 10.5 mg/dL   GFR calc non Af Amer 33 (*) >90 mL/min   GFR calc Af Amer 38 (*) >90 mL/min   Anion gap 13  5 - 15  PRO B NATRIURETIC PEPTIDE      Result Value Ref Range   Pro B Natriuretic peptide (BNP) 13590.0 (*) 0 - 125 pg/mL  TROPONIN I      Result Value Ref Range  Troponin I <0.30  <0.30 ng/mL  PROTIME-INR      Result Value Ref Range   Prothrombin Time 23.6 (*) 11.6 - 15.2 seconds   INR 2.10 (*) 0.00 - 1.49   Imaging Review Dg Chest Port 1 View  01/10/2014   CLINICAL DATA:  Shortness of breath.  EXAM: PORTABLE CHEST - 1 VIEW  COMPARISON:  Chest radiograph performed 06/19/2013  FINDINGS: The lungs are relatively well expanded. Vascular congestion is noted, with diffusely increased interstitial  markings, compatible with pulmonary edema. A small left pleural effusion is suspected. No pneumothorax is seen.  The cardiomediastinal silhouette is borderline normal in size. A pacemaker/AICD is noted overlying the right chest wall, with leads ending overlying the right atrium and right ventricle. No acute osseous abnormalities are seen.  IMPRESSION: Vascular congestion noted, with diffusely increased interstitial markings, compatible with pulmonary edema. Suspect small left pleural effusion.   Electronically Signed   By: Garald Balding M.D.   On: 01/10/2014 05:36     EKG Interpretation   Date/Time:  Tuesday January 10 2014 05:04:52 EDT Ventricular Rate:  82 PR Interval:  193 QRS Duration: 136 QT Interval:  414 QTC Calculation: 483 R Axis:   -78 Text Interpretation:  AV SEQUENTIAL PACEMAKER When compared with ECG of  06/20/2013, No significant change was found Confirmed by [facility]  MD, Lakyla Biswas  ([id number]) on 01/10/2014 5:09:22 AM      MDM   Final diagnoses:  CHF exacerbation  Renal insufficiency  Normocytic normochromic anemia  Anticoagulated on warfarin    Acute dyspnea which seems to be a CHF exacerbation. Chest x-ray or be obtained to rule out pneumonia. She'll be given a dose of furosemide in the ED. Old records are reviewed and she does have history of hospitalizations for CHF exacerbation.  Chest x-ray confirms CHF exacerbation. BMP is elevated although not as high as it has been in the past. She is resting comfortably following furosemide. Case is discussed with Dr. Anastasio Champion who agrees to admit the patient.  Delora Fuel, MD 41/93/79 [id number]

## 2014-01-10 NOTE — H&P (Signed)
[name] MRN: [id number] DOB/AGE: 1945/08/12 68 y.o. Primary Care Physician:KNOWLTON,STEPHEN D, MD Admit date: 01/10/2014 Chief Complaint: Dyspnea. HPI: This is a 68 year old lady who is known to have history of systolic congestive heart failure with a ejection fraction documented of between 15-20% previously who became dyspneic last night. She presented to the emergency room and was found to be in congestive heart failure. She is now being admitted for further management. She denies any chest pain or palpitations. She has a history of nonischemic cardiomyopathy and is status post biventricular pacemaker/AICD.  Past Medical History  Diagnosis Date  . Cardiomyopathy, nonischemic     a. 1999 nl cath;  b. 12/05 [employer] [place];  c. 10/2005 ICD extraction 2/2 enterococcus bacteremia and Veg on RV lead;  c. 05/2008 low risk Myoview (scarring w/ some evidence of inf ischemia);  d. 11/2012 Echo: EF 15-20%;  e. 01/2013 s/p MDT [facility] CRT D, ser # [id number];  f. 05/2013 Echo: EF 15%.  . Enterococcal infection     a. 10/2005 - AICD-explanted  . Pulmonary embolism     a. 07/2005 after total right hip arthroplasty  . Hilar density     a. infrahilar mass/adenopathy on CT scan 5/07; subsequently  resolved  . LBBB (left bundle branch block)   . GERD (gastroesophageal reflux disease)   . Hyperlipidemia   . Hypertension   . Tobacco abuse     a. discontinued in 1997, and then resumed  . Urinary incontinence   . Anemia     a. mild/chronic  . Villous adenoma of colon     a. tubovillous adenomatous polyp with focal high grade dysplasia; presented with hematochezia - followed by Dr. Laural Golden.  . CKD (chronic kidney disease), stage III     creatinin-1.44 in 1/09; 1.51 in 1/10  . Implantable cardioverter-defibrillator-CRT- Mdt     a.  01/2013 s/p MDT [facility] CRT D, ser # [id number]  . Chronic systolic CHF (congestive heart failure)     a. 11/2012 Echo: EF 15-20%;  b. 05/2013 TEE EF 15%.  . Pneumonia 07/2005  .  Obstructive sleep apnea     a. mild-did not tolerate CPAP (01/24/2013)  . Type II diabetes mellitus   . History of blood transfusion   . Degenerative joint disease     of knees, shoulder, and hips  . PAF (paroxysmal atrial fibrillation)     a. 07/2012 s/p TEE/DCCV;  b. chronic coumadin;  c. 05/2013 Recurrent Afib->TEE/DCCV and amio initiation.   Past Surgical History  Procedure Laterality Date  . Pacemaker removal  11/18/05    Enterococcal infection  . Total hip arthroplasty Right 07/2005  . Knee arthroscopy Right 1980's?  . Colonoscopy w/ polypectomy  2009  . Cardioversion N/A 08/20/2012    Procedure: TEE GUIDED CARDIOVERSION;  Surgeon: Yehuda Savannah, MD;  Location: [facility];  Service: Cardiovascular;  Laterality: N/A;  To be done @ bedside  . Tee without cardioversion N/A 08/20/2012    Procedure: TRANSESOPHAGEAL ECHOCARDIOGRAM (TEE);  Surgeon: Yehuda Savannah, MD;  Location: [facility];  Service: Cardiovascular;  Laterality: N/A;  . Bi-ventricular implantable cardioverter defibrillator  (crt-d)  01/24/2013  . A-v cardiac pacemaker insertion  12/05    Biventricular pacemaker/AICD  . Abdominal hysterectomy  1990/92    Initial partial hysterectomy followed by BSO  . Tubal ligation  1980's  . Cardiac catheterization    . Colonoscopy with esophagogastroduodenoscopy (egd) N/A 06/10/2013    Procedure: COLONOSCOPY WITH ESOPHAGOGASTRODUODENOSCOPY (EGD);  Surgeon:  Rogene Houston, MD;  Location: [facility];  Service: Endoscopy;  Laterality: N/A;  925  . Tee without cardioversion N/A 06/20/2013    Procedure: TRANSESOPHAGEAL ECHOCARDIOGRAM (TEE);  Surgeon: Dorothy Spark, MD;  Location: [facility];  Service: Cardiovascular;  Laterality: N/A;  . Cardioversion N/A 06/20/2013    Procedure: CARDIOVERSION;  Surgeon: Dorothy Spark, MD;  Location: [facility] END[facility]COPY;  Service: Cardiovascular;  Laterality: N/A;        Family History  Problem Relation Age of Onset  . Hypertension Mother   .  Diabetes Mother   . Coronary artery disease Father   . Diabetes Brother   . Hypertension Brother   . Lung cancer Brother   . Arthritis Other   . Diabetes Other   . Heart disease Other     female < 70    Social History:  reports that she has quit smoking. Her smoking use included Cigarettes. She smoked 0.00 packs per day for 12 years. She has never used smokeless tobacco. She reports that she does not drink alcohol or use illicit drugs.   Allergies: No Known Allergies   (Not in a hospital admission)     KXF:GHWEX from the symptoms mentioned above,there are no other symptoms referable to all systems reviewed.  Physical Exam: Blood pressure 162/92, pulse 78, temperature 98 F (36.7 C), temperature source Oral, resp. rate 17, height 5' (1.524 m), weight 72.576 kg (160 lb), SpO2 97.00%. She looks chronically sick but appears to be feeling better from her dyspnea. She is hemodynamically stable. She appears to be in a paced rhythm. Lung fields show crackles inspiratory from the mid to lower zones. There is no bronchial breathing or wheezing. She is alert and oriented.    Recent Labs  01/10/14 0526  WBC 10.8*  NEUTROABS 9.3*  HGB 9.2*  HCT 27.7*  MCV 95.5  PLT 215    Recent Labs  01/10/14 0526  NA 140  K 4.3  CL 101  CO2 26  GLUCOSE 376*  BUN 31*  CREATININE 1.54*  CALCIUM 9.6  lablast2(ast:2,ALT:2,alkphos:2,bilitot:2,prot:2,albumin:2)@    No results found for this or any previous visit (from the past 240 hour(s)).   Dg Chest Port 1 View  01/10/2014   CLINICAL DATA:  Shortness of breath.  EXAM: PORTABLE CHEST - 1 VIEW  COMPARISON:  Chest radiograph performed 06/19/2013  FINDINGS: The lungs are relatively well expanded. Vascular congestion is noted, with diffusely increased interstitial markings, compatible with pulmonary edema. A small left pleural effusion is suspected. No pneumothorax is seen.  The cardiomediastinal silhouette is borderline normal in size. A  pacemaker/AICD is noted overlying the right chest wall, with leads ending overlying the right atrium and right ventricle. No acute osseous abnormalities are seen.  IMPRESSION: Vascular congestion noted, with diffusely increased interstitial markings, compatible with pulmonary edema. Suspect small left pleural effusion.   Electronically Signed   By: Garald Balding M.D.   On: 01/10/2014 05:36   Impression: 1. Acute on chronic systolic congestive heart failure. 2. Nonischemic cardiomyopathy, ejection fraction 15-20%. 3. Diabetes mellitus. 4. Hypertension. 5. Anemia of chronic disease. 6. Chronic kidney disease. 7. Chronic anticoagulation.     Plan: 1. Admit to telemetry. 2. Intravenous diuretics. 3. Continue with all her medications.  Further recommendations will depend on patient's hospital progress.      Elliott C   01/10/2014, 8:30 AM

## 2014-01-10 NOTE — ED Notes (Signed)
Patient at this time is resting quietly with family at bedside

## 2014-01-10 NOTE — ED Notes (Signed)
Patient got up to go to the bathroom, was trying to have a bowel movement, then got up from the toilet and got short of breath.

## 2014-01-10 NOTE — ED Notes (Signed)
Patient's linen changed again.  Patient refuses to ring bell or get up and walk to bathroom.

## 2014-01-10 NOTE — ED Notes (Signed)
Patient's gown and linen were changed due to patient urinated in bed.  Patient is able to get up without assistance and sit in chair while linen being changed.

## 2014-01-10 NOTE — Progress Notes (Signed)
ANTICOAGULATION CONSULT NOTE - Initial Consult  Pharmacy Consult for Coumadin (chronic Rx PTA) Indication: atrial fibrillation  No Known Allergies  Patient Measurements: Height: 5' (152.4 cm) Weight: 160 lb (72.576 kg) IBW/kg (Calculated) : 45.5  Vital Signs: Temp: 98 F (36.7 C) (07/21 0452) Temp src: Oral (07/21 0452) BP: 149/80 mmHg (07/21 0909) Pulse Rate: 81 (07/21 0908)  Labs:  Recent Labs  01/10/14 0526 01/10/14 0835  HGB 9.2*  --   HCT 27.7*  --   PLT 215  --   LABPROT 23.6*  --   INR 2.10*  --   CREATININE 1.54*  --   TROPONINI <0.30 <0.30    Estimated Creatinine Clearance: 30.2 ml/min (by C-G formula based on Cr of 1.54).   Medical History: Past Medical History  Diagnosis Date  . Cardiomyopathy, nonischemic     a. 1999 nl cath;  b. 12/05 [employer] [place];  c. 10/2005 ICD extraction 2/2 enterococcus bacteremia and Veg on RV lead;  c. 05/2008 low risk Myoview (scarring w/ some evidence of inf ischemia);  d. 11/2012 Echo: EF 15-20%;  e. 01/2013 s/p MDT [facility] CRT D, ser # [id number];  f. 05/2013 Echo: EF 15%.  . Enterococcal infection     a. 10/2005 - AICD-explanted  . Pulmonary embolism     a. 07/2005 after total right hip arthroplasty  . Hilar density     a. infrahilar mass/adenopathy on CT scan 5/07; subsequently  resolved  . LBBB (left bundle branch block)   . GERD (gastroesophageal reflux disease)   . Hyperlipidemia   . Hypertension   . Tobacco abuse     a. discontinued in 1997, and then resumed  . Urinary incontinence   . Anemia     a. mild/chronic  . Villous adenoma of colon     a. tubovillous adenomatous polyp with focal high grade dysplasia; presented with hematochezia - followed by Dr. Laural Golden.  . CKD (chronic kidney disease), stage III     creatinin-1.44 in 1/09; 1.51 in 1/10  . Implantable cardioverter-defibrillator-CRT- Mdt     a.  01/2013 s/p MDT [facility] CRT D, ser # [id number]  . Chronic systolic CHF (congestive heart failure)     a.  11/2012 Echo: EF 15-20%;  b. 05/2013 TEE EF 15%.  . Pneumonia 07/2005  . Obstructive sleep apnea     a. mild-did not tolerate CPAP (01/24/2013)  . Type II diabetes mellitus   . History of blood transfusion   . Degenerative joint disease     of knees, shoulder, and hips  . PAF (paroxysmal atrial fibrillation)     a. 07/2012 s/p TEE/DCCV;  b. chronic coumadin;  c. 05/2013 Recurrent Afib->TEE/DCCV and amio initiation.    Medications:   (Not in a hospital admission)  Assessment: 68yo female on chronic Coumadin PTA for h/o afib.  Pt's home dose reported as 2.5mg  daily.  INR is therapeutic on admission.    Goal of Therapy:  INR 2-3 Monitor platelets by anticoagulation protocol: Yes   Plan:  Coumadin 2.5mg  po every evening (home dose) INR daily for now.  Hart Robinsons A 01/10/2014,9:46 AM

## 2014-01-10 NOTE — ED Notes (Signed)
Patient being transferred to 3a. Report given to [facility]

## 2014-01-11 LAB — GLUCOSE, CAPILLARY
Glucose-Capillary: 158 mg/dL — ABNORMAL HIGH (ref 70–99)
Glucose-Capillary: 250 mg/dL — ABNORMAL HIGH (ref 70–99)

## 2014-01-11 LAB — COMPREHENSIVE METABOLIC PANEL
ALBUMIN: 3.6 g/dL (ref 3.5–5.2)
ALT: 10 U/L (ref 0–35)
ANION GAP: 13 (ref 5–15)
AST: 12 U/L (ref 0–37)
Alkaline Phosphatase: 73 U/L (ref 39–117)
BUN: 32 mg/dL — AB (ref 6–23)
CO2: 30 mEq/L (ref 19–32)
CREATININE: 1.78 mg/dL — AB (ref 0.50–1.10)
Calcium: 9.9 mg/dL (ref 8.4–10.5)
Chloride: 101 mEq/L (ref 96–112)
GFR calc Af Amer: 32 mL/min — ABNORMAL LOW (ref >90)
GFR calc non Af Amer: 28 mL/min — ABNORMAL LOW (ref >90)
Glucose, Bld: 177 mg/dL — ABNORMAL HIGH (ref 70–99)
Potassium: 3.5 mEq/L — ABNORMAL LOW (ref 3.7–5.3)
Sodium: 144 mEq/L (ref 137–147)
TOTAL PROTEIN: 7 g/dL (ref 6.0–8.3)
Total Bilirubin: 1.2 mg/dL (ref 0.3–1.2)

## 2014-01-11 LAB — CBC
HEMATOCRIT: 28 % — AB (ref 36.0–46.0)
HEMOGLOBIN: 9.4 g/dL — AB (ref 12.0–15.0)
MCH: 31.6 pg (ref 26.0–34.0)
MCHC: 33.6 g/dL (ref 30.0–36.0)
MCV: 94.3 fL (ref 78.0–100.0)
Platelets: 225 10*3/uL (ref 150–400)
RBC: 2.97 MIL/uL — ABNORMAL LOW (ref 3.87–5.11)
RDW: 12.9 % (ref 11.5–15.5)
WBC: 5.4 10*3/uL (ref 4.0–10.5)

## 2014-01-11 LAB — PROTIME-INR
INR: 1.88 — AB (ref 0.00–1.49)
Prothrombin Time: 21.6 seconds — ABNORMAL HIGH (ref 11.6–15.2)

## 2014-01-11 LAB — PRO B NATRIURETIC PEPTIDE: Pro B Natriuretic peptide (BNP): 24607 pg/mL — ABNORMAL HIGH (ref 0–125)

## 2014-01-11 MED ORDER — WARFARIN SODIUM 5 MG PO TABS: 5.0000 mg | ORAL_TABLET | Freq: Once | ORAL | Status: DC

## 2014-01-11 NOTE — Progress Notes (Signed)
[place] for Coumadin  Indication: atrial fibrillation  No Known Allergies  Patient Measurements: Height: 5' (152.4 cm) Weight: 158 lb 4.6 oz (71.8 kg) IBW/kg (Calculated) : 45.5  Vital Signs: Temp: 98.1 F (36.7 C) (07/22 0608) Temp src: Oral (07/22 0608) BP: 131/73 mmHg (07/22 0608) Pulse Rate: 79 (07/22 0608)  Labs:  Recent Labs  01/10/14 0526 01/10/14 0835 01/10/14 1403 01/10/14 2035 01/11/14 0545  HGB 9.2*  --   --   --  9.4*  HCT 27.7*  --   --   --  28.0*  PLT 215  --   --   --  225  LABPROT 23.6*  --   --   --  21.6*  INR 2.10*  --   --   --  1.88*  CREATININE 1.54*  --   --   --  1.78*  TROPONINI <0.30 <0.30 <0.30 <0.30  --     Estimated Creatinine Clearance: 26 ml/min (by C-G formula based on Cr of 1.78).   Medical History: Past Medical History  Diagnosis Date  . Cardiomyopathy, nonischemic     a. 1999 nl cath;  b. 12/05 [employer] [place];  c. 10/2005 ICD extraction 2/2 enterococcus bacteremia and Veg on RV lead;  c. 05/2008 low risk Myoview (scarring w/ some evidence of inf ischemia);  d. 11/2012 Echo: EF 15-20%;  e. 01/2013 s/p MDT [facility] CRT D, ser # [id number];  f. 05/2013 Echo: EF 15%.  . Enterococcal infection     a. 10/2005 - AICD-explanted  . Pulmonary embolism     a. 07/2005 after total right hip arthroplasty  . Hilar density     a. infrahilar mass/adenopathy on CT scan 5/07; subsequently  resolved  . LBBB (left bundle branch block)   . GERD (gastroesophageal reflux disease)   . Hyperlipidemia   . Hypertension   . Tobacco abuse     a. discontinued in 1997, and then resumed  . Urinary incontinence   . Anemia     a. mild/chronic  . Villous adenoma of colon     a. tubovillous adenomatous polyp with focal high grade dysplasia; presented with hematochezia - followed by Dr. Laural Golden.  . CKD (chronic kidney disease), stage III     creatinin-1.44 in 1/09; 1.51 in 1/10  . Implantable cardioverter-defibrillator-CRT-  Mdt     a.  01/2013 s/p MDT [facility] CRT D, ser # [id number]  . Chronic systolic CHF (congestive heart failure)     a. 11/2012 Echo: EF 15-20%;  b. 05/2013 TEE EF 15%.  . Pneumonia 07/2005  . Obstructive sleep apnea     a. mild-did not tolerate CPAP (01/24/2013)  . Type II diabetes mellitus   . History of blood transfusion   . Degenerative joint disease     of knees, shoulder, and hips  . PAF (paroxysmal atrial fibrillation)     a. 07/2012 s/p TEE/DCCV;  b. chronic coumadin;  c. 05/2013 Recurrent Afib->TEE/DCCV and amio initiation.    Medications:  Prescriptions prior to admission  Medication Sig Dispense Refill  . amiodarone (PACERONE) 200 MG tablet Take 1 tablet (200 mg total) by mouth daily.  90 tablet  3  . carvedilol (COREG) 25 MG tablet Take 25 mg by mouth 2 (two) times daily.        . cetirizine (ZYRTEC) 10 MG tablet Take 10 mg by mouth daily.      . colchicine 0.6 MG tablet Take 0.6 mg by mouth  daily as needed (gout).       Marland Kitchen digoxin (LANOXIN) 0.125 MG tablet Take 0.5 tablets (0.0625 mg total) by mouth daily.  30 tablet  3  . furosemide (LASIX) 80 MG tablet Take 80 mg by mouth 2 (two) times daily.       Marland Kitchen HYDROcodone-acetaminophen (NORCO) 10-325 MG per tablet Take 1 tablet by mouth every morning.       Marland Kitchen lisinopril (PRINIVIL,ZESTRIL) 10 MG tablet Take 0.5 tablets (5 mg total) by mouth daily.  30 tablet  6  . metFORMIN (GLUCOPHAGE) 1000 MG tablet Take 1 tablet (1,000 mg total) by mouth 2 (two) times daily. HOLD for today, then resume usual dose tomorrow, 01/26/2013.      . metoCLOPramide (REGLAN) 5 MG tablet Take 5 mg by mouth 4 (four) times daily.      . ondansetron (ZOFRAN) 4 MG tablet Take 4 mg by mouth every 8 (eight) hours as needed for nausea or vomiting.       Marland Kitchen oxyCODONE-acetaminophen (PERCOCET) 10-325 MG per tablet Take 1 tablet by mouth at bedtime.       . polyethylene glycol powder (GLYCOLAX/MIRALAX) powder Take 17 g by mouth daily as needed (constipation).       . potassium  chloride SA (K-DUR,KLOR-CON) 20 MEQ tablet Take 1 tablet (20 mEq total) by mouth daily.  30 tablet  6  . pravastatin (PRAVACHOL) 40 MG tablet Take 80 mg by mouth at bedtime.       Marland Kitchen warfarin (COUMADIN) 5 MG tablet Take 2.5 mg by mouth every evening.        Assessment: 68yo female on chronic Coumadin PTA for h/o afib.  Pt's home dose reported as 2.5mg  daily.  INR is therapeutic on admission, but has trended down today.   No bleeding noted.   Goal of Therapy:  INR 2-3   Plan:  Coumadin 5mg  po x1 today INR daily for now  Biagio Borg 01/11/2014,8:36 AM

## 2014-01-11 NOTE — Progress Notes (Signed)
Discharge instructions given to patient. Voiced understanding. Husband to take home in private car.

## 2014-01-11 NOTE — Discharge Summary (Signed)
Physician Discharge Summary  Patient ID: [name] MRN: [id number] DOB/AGE: [birth year] 68 y.o. Primary Care Physician:KNOWLTON,STEPHEN D, MD Admit date: 01/10/2014 Discharge date: 01/11/2014    Discharge Diagnoses:  1. Acute on chronic systolic congestive heart failure, compensated now. 2. Hypertension. 3. Diabetes. 4. Chronic anticoagulation.     Medication List    STOP taking these medications       oxyCODONE-acetaminophen 10-325 MG per tablet  Commonly known as:  PERCOCET      TAKE these medications       amiodarone 200 MG tablet  Commonly known as:  PACERONE  Take 1 tablet (200 mg total) by mouth daily.     carvedilol 25 MG tablet  Commonly known as:  COREG  Take 25 mg by mouth 2 (two) times daily.     cetirizine 10 MG tablet  Commonly known as:  ZYRTEC  Take 10 mg by mouth daily.     colchicine 0.6 MG tablet  Take 0.6 mg by mouth daily as needed (gout).     digoxin 0.125 MG tablet  Commonly known as:  LANOXIN  Take 0.5 tablets (0.0625 mg total) by mouth daily.     furosemide 80 MG tablet  Commonly known as:  LASIX  Take 80 mg by mouth 2 (two) times daily.     HYDROcodone-acetaminophen 10-325 MG per tablet  Commonly known as:  NORCO  Take 1 tablet by mouth every morning.     lisinopril 10 MG tablet  Commonly known as:  PRINIVIL,ZESTRIL  Take 0.5 tablets (5 mg total) by mouth daily.     metFORMIN 1000 MG tablet  Commonly known as:  GLUCOPHAGE  Take 1 tablet (1,000 mg total) by mouth 2 (two) times daily. HOLD for today, then resume usual dose tomorrow, 01/26/2013.     metoCLOPramide 5 MG tablet  Commonly known as:  REGLAN  Take 5 mg by mouth 4 (four) times daily.     ondansetron 4 MG tablet  Commonly known as:  ZOFRAN  Take 4 mg by mouth every 8 (eight) hours as needed for nausea or vomiting.     polyethylene glycol powder powder  Commonly known as:  GLYCOLAX/MIRALAX  Take 17 g by mouth daily as needed (constipation).     potassium  chloride SA 20 MEQ tablet  Commonly known as:  K-DUR,KLOR-CON  Take 1 tablet (20 mEq total) by mouth daily.     pravastatin 40 MG tablet  Commonly known as:  PRAVACHOL  Take 80 mg by mouth at bedtime.     warfarin 5 MG tablet  Commonly known as:  COUMADIN  Take 2.5 mg by mouth every evening.        Discharged Condition: Stable and improved.    Consults: None.  Significant Diagnostic Studies: Dg Chest Port 1 View  01/10/2014   CLINICAL DATA:  Shortness of breath.  EXAM: PORTABLE CHEST - 1 VIEW  COMPARISON:  Chest radiograph performed 06/19/2013  FINDINGS: The lungs are relatively well expanded. Vascular congestion is noted, with diffusely increased interstitial markings, compatible with pulmonary edema. A small left pleural effusion is suspected. No pneumothorax is seen.  The cardiomediastinal silhouette is borderline normal in size. A pacemaker/AICD is noted overlying the right chest wall, with leads ending overlying the right atrium and right ventricle. No acute osseous abnormalities are seen.  IMPRESSION: Vascular congestion noted, with diffusely increased interstitial markings, compatible with pulmonary edema. Suspect small left pleural effusion.   Electronically Signed   By: Jacqulynn Cadet  Chang M.D.   On: 01/10/2014 05:36    Lab Results: Basic Metabolic Panel:  Recent Labs  01/10/14 0526 01/11/14 0545  NA 140 144  K 4.3 3.5*  CL 101 101  CO2 26 30  GLUCOSE 376* 177*  BUN 31* 32*  CREATININE 1.54* 1.78*  CALCIUM 9.6 9.9   Liver Function Tests:  Recent Labs  01/11/14 0545  AST 12  ALT 10  ALKPHOS 73  BILITOT 1.2  PROT 7.0  ALBUMIN 3.6     CBC:  Recent Labs  01/10/14 0526 01/11/14 0545  WBC 10.8* 5.4  NEUTROABS 9.3*  --   HGB 9.2* 9.4*  HCT 27.7* 28.0*  MCV 95.5 94.3  PLT 215 225    No results found for this or any previous visit (from the past 240 hour(s)).   Hospital Course: This 68 year old lady presented to the hospital with symptoms of dyspnea.  Please see initial history as outlined below: HPI: This is a 68 year old lady who is known to have history of systolic congestive heart failure with a ejection fraction documented of between 15-20% previously who became dyspneic last night. She presented to the emergency room and was found to be in congestive heart failure. She is now being admitted for further management. She denies any chest pain or palpitations. She has a history of nonischemic cardiomyopathy and is status post biventricular pacemaker/AICD. Patient was treated with intravenous diuresis. Serial cardiac enzymes are negative. She does have an ejection fraction previously documented of 15-20%. She has improved significantly with her breathing. She stable for discharge and will followup in the outpatient setting. Discharge Exam: Blood pressure 131/73, pulse 79, temperature 98.1 F (36.7 C), temperature source Oral, resp. rate 20, height 5' (1.524 m), weight 71.8 kg (158 lb 4.6 oz), SpO2 98.00%. She looks systemically well. Heart sounds are present without gallop rhythm. Lung fields are entirely clear without any crackles, wheezing or bronchial breathing. She is alert and oriented.  Disposition: Home.      Discharge Instructions   Diet - low sodium heart healthy    Complete by:  As directed      Increase activity slowly    Complete by:  As directed            Follow-up Information   Follow up with Robert Bellow, MD In 2 weeks.   Specialty:  Family Medicine   Contact information:   [address] [place] [address] [phone]       Signed: Doree Albee   01/11/2014, 8:40 AM

## 2014-01-12 NOTE — Progress Notes (Signed)
UR chart review completed.  

## 2014-01-17 ENCOUNTER — Ambulatory Visit (HOSPITAL_COMMUNITY)
Admit: 2014-01-17 | Discharge: 2014-01-17 | Disposition: A | Discharge status: Home / Self Care | Payer: Medicare HMO | Source: Ambulatory Visit | Attending: Internal Medicine | Admitting: Internal Medicine

## 2014-01-17 DIAGNOSIS — K769 Liver disease, unspecified: Secondary | ICD-10-CM

## 2014-01-17 DIAGNOSIS — K824 Cholesterolosis of gallbladder: Secondary | ICD-10-CM | POA: Diagnosis not present

## 2014-01-17 DIAGNOSIS — K7689 Other specified diseases of liver: Secondary | ICD-10-CM | POA: Diagnosis not present

## 2014-02-01 ENCOUNTER — Encounter: Payer: Self-pay | Admitting: Internal Medicine

## 2014-02-08 ENCOUNTER — Encounter: Payer: Self-pay | Admitting: Physician Assistant

## 2014-02-08 ENCOUNTER — Ambulatory Visit (INDEPENDENT_AMBULATORY_CARE_PROVIDER_SITE_OTHER): Payer: Commercial Managed Care - HMO | Admitting: Physician Assistant

## 2014-02-08 VITALS — BP 110/60 | HR 71 | Ht 65.0 in | Wt 158.0 lb

## 2014-02-08 DIAGNOSIS — D649 Anemia, unspecified: Secondary | ICD-10-CM

## 2014-02-08 DIAGNOSIS — I10 Essential (primary) hypertension: Secondary | ICD-10-CM

## 2014-02-08 DIAGNOSIS — I4891 Unspecified atrial fibrillation: Secondary | ICD-10-CM

## 2014-02-08 DIAGNOSIS — I4819 Other persistent atrial fibrillation: Secondary | ICD-10-CM

## 2014-02-08 DIAGNOSIS — I43 Cardiomyopathy in diseases classified elsewhere: Secondary | ICD-10-CM

## 2014-02-08 DIAGNOSIS — I428 Other cardiomyopathies: Secondary | ICD-10-CM

## 2014-02-08 MED ORDER — AMIODARONE HCL 200 MG PO TABS: 200.0000 mg | ORAL_TABLET | Freq: Every day | ORAL | Status: DC

## 2014-02-08 MED ORDER — POTASSIUM CHLORIDE CRYS ER 20 MEQ PO TBCR: 20.0000 meq | EXTENDED_RELEASE_TABLET | Freq: Every day | ORAL | Status: DC

## 2014-02-08 MED ORDER — WARFARIN SODIUM 5 MG PO TABS: 2.5000 mg | ORAL_TABLET | Freq: Every evening | ORAL | Status: DC

## 2014-02-08 MED ORDER — CARVEDILOL 25 MG PO TABS: 25.0000 mg | ORAL_TABLET | Freq: Two times a day (BID) | ORAL | Status: DC

## 2014-02-08 MED ORDER — FUROSEMIDE 80 MG PO TABS: 80.0000 mg | ORAL_TABLET | Freq: Two times a day (BID) | ORAL | Status: DC

## 2014-02-08 MED ORDER — LISINOPRIL 10 MG PO TABS: 5.0000 mg | ORAL_TABLET | Freq: Every day | ORAL | Status: DC

## 2014-02-08 NOTE — Assessment & Plan Note (Signed)
Lab work done by Dr. Karie Kirks yesterday.

## 2014-02-08 NOTE — Assessment & Plan Note (Addendum)
No EKG done today. Patient has a device clinic followup in October. Patient is on amiodarone and Coumadin. She has had some falls with a lot of bruising and an INR of 6.2 because she did not followup on her blood work when she was supposed to. This has to be watched closely. Monitored by Dr. Karie Kirks.

## 2014-02-08 NOTE — Patient Instructions (Signed)
Your physician recommends that you schedule a follow-up appointment in:  1 month with Dr. Lovena Le  Your physician recommends that you continue on your current medications as directed. Please refer to the Current Medication list given to you today.  I have refilled your medications  I have given you a 2 gram sodium diet to follow  Thank you for choosing [facility]!!

## 2014-02-08 NOTE — Progress Notes (Signed)
HPI: This is a 68 year old female patient [name] who has a nonischemic cardiomyopathy ejection fraction 15-20% and is status post biventricular pacemaker/AICD, PAF on Coumadin. She was recently hospitalized overnight for acute on chronic congestive heart failure. She was diuresed with IV diuretics and sent home. She admits to eating a lot of salt at that time. She has since cut back.  The patient comes in today with multiple bruises on her arm chin and leg. She fell twice in the past month both times found like she tripped over a step. Her INR was also elevated at 6.2 on 02/01/14. She is followed by Dr. Karie Kirks for this and missed an appointment. Overall she feels washed up and has no get up and go. She has dyspnea on exertion but does fine if she goes at her own pace. She denies any chest pain, dizziness or presyncope. She is just weak.  No Known Allergies   Current Outpatient Prescriptions  Medication Sig Dispense Refill  . amiodarone (PACERONE) 200 MG tablet Take 1 tablet (200 mg total) by mouth daily.  90 tablet  3  . carvedilol (COREG) 25 MG tablet Take 25 mg by mouth 2 (two) times daily.        . cetirizine (ZYRTEC) 10 MG tablet Take 10 mg by mouth daily.      . colchicine 0.6 MG tablet Take 0.6 mg by mouth daily as needed (gout).       Marland Kitchen digoxin (LANOXIN) 0.125 MG tablet Take 0.5 tablets (0.0625 mg total) by mouth daily.  30 tablet  3  . furosemide (LASIX) 80 MG tablet Take 80 mg by mouth 2 (two) times daily.       Marland Kitchen HYDROcodone-acetaminophen (NORCO) 10-325 MG per tablet Take 1 tablet by mouth every morning.       Marland Kitchen lisinopril (PRINIVIL,ZESTRIL) 10 MG tablet Take 0.5 tablets (5 mg total) by mouth daily.  30 tablet  6  . metFORMIN (GLUCOPHAGE) 1000 MG tablet Take 1 tablet (1,000 mg total) by mouth 2 (two) times daily. HOLD for today, then resume usual dose tomorrow, 01/26/2013.      . metoCLOPramide (REGLAN) 5 MG tablet Take 5 mg by mouth 4 (four) times daily.      .  ondansetron (ZOFRAN) 4 MG tablet Take 4 mg by mouth every 8 (eight) hours as needed for nausea or vomiting.       Marland Kitchen oxyCODONE-acetaminophen (PERCOCET) 10-325 MG per tablet       . polyethylene glycol powder (GLYCOLAX/MIRALAX) powder Take 17 g by mouth daily as needed (constipation).       . potassium chloride SA (K-DUR,KLOR-CON) 20 MEQ tablet Take 1 tablet (20 mEq total) by mouth daily.  30 tablet  6  . pravastatin (PRAVACHOL) 40 MG tablet Take 80 mg by mouth at bedtime.       Marland Kitchen warfarin (COUMADIN) 5 MG tablet Take 2.5 mg by mouth every evening.       No current facility-administered medications for this visit.    Past Medical History  Diagnosis Date  . Cardiomyopathy, nonischemic     a. 1999 nl cath;  b. 12/05 Guidant [place];  c. 10/2005 ICD extraction 2/2 enterococcus bacteremia and Veg on RV lead;  c. 05/2008 low risk Myoview (scarring w/ some evidence of inf ischemia);  d. 11/2012 Echo: EF 15-20%;  e. 01/2013 s/p MDT [place] CRT D, ser # [id number];  f. 05/2013 Echo: EF 15%.  . Enterococcal infection  a. 10/2005 - AICD-explanted  . Pulmonary embolism     a. 07/2005 after total right hip arthroplasty  . Hilar density     a. infrahilar mass/adenopathy on CT scan 5/07; subsequently  resolved  . LBBB (left bundle branch block)   . GERD (gastroesophageal reflux disease)   . Hyperlipidemia   . Hypertension   . Tobacco abuse     a. discontinued in 1997, and then resumed  . Urinary incontinence   . Anemia     a. mild/chronic  . Villous adenoma of colon     a. tubovillous adenomatous polyp with focal high grade dysplasia; presented with hematochezia - followed by Dr. Laural Golden.  . CKD (chronic kidney disease), stage III     creatinin-1.44 in 1/09; 1.51 in 1/10  . Implantable cardioverter-defibrillator-CRT- Mdt     a.  01/2013 s/p MDT [place] CRT D, ser # [id number]  . Chronic systolic CHF (congestive heart failure)     a. 11/2012 Echo: EF 15-20%;  b. 05/2013 TEE EF 15%.  . Pneumonia 07/2005    . Obstructive sleep apnea     a. mild-did not tolerate CPAP (01/24/2013)  . Type II diabetes mellitus   . History of blood transfusion   . Degenerative joint disease     of knees, shoulder, and hips  . PAF (paroxysmal atrial fibrillation)     a. 07/2012 s/p TEE/DCCV;  b. chronic coumadin;  c. 05/2013 Recurrent Afib->TEE/DCCV and amio initiation.    Past Surgical History  Procedure Laterality Date  . Pacemaker removal  11/18/05    Enterococcal infection  . Total hip arthroplasty Right 07/2005  . Knee arthroscopy Right 1980's?  . Colonoscopy w/ polypectomy  2009  . Cardioversion N/A 08/20/2012    Procedure: TEE GUIDED CARDIOVERSION;  Surgeon: Yehuda Savannah, MD;  Location: [facility];  Service: Cardiovascular;  Laterality: N/A;  To be done @ bedside  . Tee without cardioversion N/A 08/20/2012    Procedure: TRANSESOPHAGEAL ECHOCARDIOGRAM (TEE);  Surgeon: Yehuda Savannah, MD;  Location: [facility];  Service: Cardiovascular;  Laterality: N/A;  . Bi-ventricular implantable cardioverter defibrillator  (crt-d)  01/24/2013  . A-v cardiac pacemaker insertion  12/05    Biventricular pacemaker/AICD  . Abdominal hysterectomy  1990/92    Initial partial hysterectomy followed by BSO  . Tubal ligation  1980's  . Cardiac catheterization    . Colonoscopy with esophagogastroduodenoscopy (egd) N/A 06/10/2013    Procedure: COLONOSCOPY WITH ESOPHAGOGASTRODUODENOSCOPY (EGD);  Surgeon: Rogene Houston, MD;  Location: [facility];  Service: Endoscopy;  Laterality: N/A;  925  . Tee without cardioversion N/A 06/20/2013    Procedure: TRANSESOPHAGEAL ECHOCARDIOGRAM (TEE);  Surgeon: Dorothy Spark, MD;  Location: [facility];  Service: Cardiovascular;  Laterality: N/A;  . Cardioversion N/A 06/20/2013    Procedure: CARDIOVERSION;  Surgeon: Dorothy Spark, MD;  Location[facility] END[facility];  Service: Cardiovascular;  Laterality: N/A;    Family History  Problem Relation Age of Onset  . Hypertension Mother   .  Diabetes Mother   . Coronary artery disease Father   . Diabetes Brother   . Hypertension Brother   . Lung cancer Brother   . Arthritis Other   . Diabetes Other   . Heart disease Other     female < 27    History   Social History  . Marital Status: Married    Spouse Name: N/A    Number of Children: N/A  . Years of Education: N/A  Occupational History  . Not on file.   Social History Main Topics  . Smoking status: Former Smoker -- 12 years    Types: Cigarettes  . Smokeless tobacco: Never Used     Comment: 05/2013: Smokes an occasional cigarette.  Says that she doesn't inhale.  . Alcohol Use: No  . Drug Use: No  . Sexual Activity: Not Currently    Birth Control/ Protection: None   Other Topics Concern  . Not on file   Social History Narrative   Married, lives in [place] with spouse. Retired Secretary/administrator.     ROS: See history of present illness otherwise negative  BP 110/60  Pulse 71  Ht 5\' 5"  (1.651 m)  Wt 158 lb (71.668 kg)  BMI 26.29 kg/m2  PHYSICAL EXAM: Well-nournished, in no acute distress. Neck: No JVD, HJR, Bruit, or thyroid enlargement  Lungs: Decreased breath sounds but No tachypnea, clear without wheezing, rales, or rhonchi  Cardiovascular: RRR, , heart sounds distant, no murmurs, gallops, bruit, thrill, or heave.  Abdomen: BS normal. Soft without organomegaly, masses, lesions or tenderness.  Extremities: without cyanosis, clubbing or edema. Good distal pulses bilateral  SKin: Warm, no lesions or rashes   Musculoskeletal: No deformities  Neuro: no focal signs   Wt Readings from Last 3 Encounters:  02/08/14 158 lb (71.668 kg)  01/10/14 158 lb 4.6 oz (71.8 kg)  10/11/13 155 lb (70.308 kg)    EKG reviewed from the hospital and she was AV paced

## 2014-02-08 NOTE — Assessment & Plan Note (Signed)
Patient had recent hospitalization with acute on chronic heart failure do to excessive sodium intake. She is currently compensated. Have discussed 2 g sodium diet in detail with patient. Followup with Dr. Lovena Le in one month. Suspect her weakness is due to to her low ejection fraction

## 2014-02-09 ENCOUNTER — Encounter: Payer: Self-pay | Admitting: Cardiology

## 2014-03-14 ENCOUNTER — Encounter: Payer: Self-pay | Admitting: Internal Medicine

## 2014-03-14 ENCOUNTER — Ambulatory Visit (INDEPENDENT_AMBULATORY_CARE_PROVIDER_SITE_OTHER): Payer: Commercial Managed Care - HMO | Admitting: Internal Medicine

## 2014-03-14 VITALS — BP 80/42 | HR 76 | Ht 65.0 in | Wt 153.0 lb

## 2014-03-14 DIAGNOSIS — I428 Other cardiomyopathies: Secondary | ICD-10-CM

## 2014-03-14 DIAGNOSIS — Z95810 Presence of automatic (implantable) cardiac defibrillator: Secondary | ICD-10-CM

## 2014-03-14 DIAGNOSIS — I5022 Chronic systolic (congestive) heart failure: Secondary | ICD-10-CM

## 2014-03-14 DIAGNOSIS — I509 Heart failure, unspecified: Secondary | ICD-10-CM

## 2014-03-14 DIAGNOSIS — I4891 Unspecified atrial fibrillation: Secondary | ICD-10-CM

## 2014-03-14 DIAGNOSIS — I43 Cardiomyopathy in diseases classified elsewhere: Secondary | ICD-10-CM

## 2014-03-14 LAB — MDC_IDC_ENUM_SESS_TYPE_INCLINIC
Brady Statistic AP VS Percent: 1.6 %
Brady Statistic AS VP Percent: 7.1 %
HIGH POWER IMPEDANCE MEASURED VALUE: 61 Ohm
Lead Channel Pacing Threshold Amplitude: 0.75 V
Lead Channel Pacing Threshold Amplitude: 1 V
Lead Channel Pacing Threshold Amplitude: 1.5 V
Lead Channel Pacing Threshold Pulse Width: 0.4 ms
Lead Channel Pacing Threshold Pulse Width: 0.4 ms
Lead Channel Pacing Threshold Pulse Width: 0.4 ms
Lead Channel Sensing Intrinsic Amplitude: 20 mV
Lead Channel Setting Pacing Amplitude: 2 V
Lead Channel Setting Pacing Amplitude: 2.25 V
Lead Channel Setting Pacing Amplitude: 2.5 V
Lead Channel Setting Pacing Pulse Width: 0.4 ms
MDC IDC MSMT LEADCHNL LV IMPEDANCE VALUE: 646 Ohm
MDC IDC MSMT LEADCHNL RA IMPEDANCE VALUE: 399 Ohm
MDC IDC MSMT LEADCHNL RA SENSING INTR AMPL: 1.8 mV
MDC IDC MSMT LEADCHNL RV IMPEDANCE VALUE: 551 Ohm
MDC IDC SET LEADCHNL LV PACING PULSEWIDTH: 0.4 ms
MDC IDC SET LEADCHNL RV SENSING SENSITIVITY: 0.3 mV
MDC IDC STAT BRADY AP VP PERCENT: 91.2 %
MDC IDC STAT BRADY AS VS PERCENT: 0.1 %
Zone Setting Detection Interval: 300 ms
Zone Setting Detection Interval: 350 ms
Zone Setting Detection Interval: 350 ms
Zone Setting Detection Interval: 360 ms

## 2014-03-14 MED ORDER — CARVEDILOL 25 MG PO TABS: 12.5000 mg | ORAL_TABLET | Freq: Two times a day (BID) | ORAL | Status: DC

## 2014-03-14 NOTE — Assessment & Plan Note (Signed)
Her Medtronic biventricular ICD he appears to be working normally. Her fluid index suggests that she is not wet. Her activity monitor suggests that she is very sedentary. We'll recheck in several months.

## 2014-03-14 NOTE — Progress Notes (Signed)
HPI [name] returns today for followup. She is a pleasant 68 yo woman with a h/o chronic systolic heart failure BP10%, an ischemic CM, LBBB, and atrial fibrillation. She has maintained sinus rhythm on amiodarone. She is s/p ICD implant with a BiV device placed over a year ago. She denies chest pain. She has class 3 heart failure, despite maintaining NSR. Her symptoms are mostly low output. Her blood pressures chronically low. No Known Allergies   Current Outpatient Prescriptions  Medication Sig Dispense Refill  . amiodarone (PACERONE) 200 MG tablet Take 1 tablet (200 mg total) by mouth daily.  90 tablet  3  . carvedilol (COREG) 25 MG tablet Take 0.5 tablets (12.5 mg total) by mouth 2 (two) times daily.  90 tablet  3  . cetirizine (ZYRTEC) 10 MG tablet Take 10 mg by mouth daily.      . colchicine 0.6 MG tablet Take 0.6 mg by mouth 2 (two) times daily.       . digoxin (LANOXIN) 0.125 MG tablet Take 0.5 tablets (0.0625 mg total) by mouth daily.  30 tablet  3  . furosemide (LASIX) 80 MG tablet Take 1 tablet (80 mg total) by mouth 2 (two) times daily.  60 tablet  3  . HYDROcodone-acetaminophen (NORCO) 10-325 MG per tablet Take 1 tablet by mouth every 8 (eight) hours as needed (pain).       Marland Kitchen lisinopril (PRINIVIL,ZESTRIL) 10 MG tablet Take 0.5 tablets (5 mg total) by mouth daily.  30 tablet  6  . metFORMIN (GLUCOPHAGE) 1000 MG tablet Take 1,000 mg by mouth 2 (two) times daily.      . metoCLOPramide (REGLAN) 5 MG tablet Take 5 mg by mouth 4 (four) times daily.      . ondansetron (ZOFRAN) 4 MG tablet Take 4 mg by mouth every 4 (four) hours as needed for nausea.       Marland Kitchen oxyCODONE-acetaminophen (PERCOCET) 10-325 MG per tablet Take 1 tablet by mouth every 6 (six) hours as needed for pain.       . polyethylene glycol powder (GLYCOLAX/MIRALAX) powder Take 17 g by mouth daily as needed (constipation).       . potassium chloride SA (K-DUR,KLOR-CON) 20 MEQ tablet Take 1 tablet (20 mEq total) by  mouth daily.  30 tablet  6  . pravastatin (PRAVACHOL) 40 MG tablet Take 80 mg by mouth at bedtime.       Marland Kitchen warfarin (COUMADIN) 5 MG tablet Take 2.5-5 mg by mouth daily.       No current facility-administered medications for this visit.     Past Medical History  Diagnosis Date  . Cardiomyopathy, nonischemic     a. 1999 nl cath;  b. 12/05 Guidant [place];  c. 10/2005 ICD extraction 2/2 enterococcus bacteremia and Veg on RV lead;  c. 05/2008 low risk Myoview (scarring w/ some evidence of inf ischemia);  d. 11/2012 Echo: EF 15-20%;  e. 01/2013 s/p MDT [place] CRT D, ser # [id number];  f. 05/2013 Echo: EF 15%.  . Enterococcal infection     a. 10/2005 - AICD-explanted  . Pulmonary embolism     a. 07/2005 after total right hip arthroplasty  . Hilar density     a. infrahilar mass/adenopathy on CT scan 5/07; subsequently  resolved  . LBBB (left bundle branch block)   . GERD (gastroesophageal reflux disease)   . Hyperlipidemia   . Hypertension   . Tobacco abuse     a. discontinued  in 1997, and then resumed  . Urinary incontinence   . Anemia     a. mild/chronic  . Villous adenoma of colon     a. tubovillous adenomatous polyp with focal high grade dysplasia; presented with hematochezia - followed by Dr. Laural Golden.  . CKD (chronic kidney disease), stage III     creatinin-1.44 in 1/09; 1.51 in 1/10  . Implantable cardioverter-defibrillator-CRT- Mdt     a.  01/2013 s/p MDT [place] CRT D, ser # [id number]  . Chronic systolic CHF (congestive heart failure)     a. 11/2012 Echo: EF 15-20%;  b. 05/2013 TEE EF 15%.  . Pneumonia 07/2005  . Obstructive sleep apnea     a. mild-did not tolerate CPAP (01/24/2013)  . Type II diabetes mellitus   . History of blood transfusion   . Degenerative joint disease     of knees, shoulder, and hips  . PAF (paroxysmal atrial fibrillation)     a. 07/2012 s/p TEE/DCCV;  b. chronic coumadin;  c. 05/2013 Recurrent Afib->TEE/DCCV and amio initiation.    ROS:   All systems  reviewed and negative except as noted in the HPI.   Past Surgical History  Procedure Laterality Date  . Pacemaker removal  11/18/05    Enterococcal infection  . Total hip arthroplasty Right 07/2005  . Knee arthroscopy Right 1980's?  . Colonoscopy w/ polypectomy  2009  . Cardioversion N/A 08/20/2012    Procedure: TEE GUIDED CARDIOVERSION;  Surgeon: Yehuda Savannah, MD;  Location: [facility];  Service: Cardiovascular;  Laterality: N/A;  To be done @ bedside  . Tee without cardioversion N/A 08/20/2012    Procedure: TRANSESOPHAGEAL ECHOCARDIOGRAM (TEE);  Surgeon: Yehuda Savannah, MD;  Location: [facility];  Service: Cardiovascular;  Laterality: N/A;  . Bi-ventricular implantable cardioverter defibrillator  (crt-d)  01/24/2013  . A-v cardiac pacemaker insertion  12/05    Biventricular pacemaker/AICD  . Abdominal hysterectomy  1990/92    Initial partial hysterectomy followed by BSO  . Tubal ligation  1980's  . Cardiac catheterization    . Colonoscopy with esophagogastroduodenoscopy (egd) N/A 06/10/2013    Procedure: COLONOSCOPY WITH ESOPHAGOGASTRODUODENOSCOPY (EGD);  Surgeon: Rogene Houston, MD;  Location: [facility];  Service: Endoscopy;  Laterality: N/A;  925  . Tee without cardioversion N/A 06/20/2013    Procedure: TRANSESOPHAGEAL ECHOCARDIOGRAM (TEE);  Surgeon: Dorothy Spark, MD;  Location: [facility];  Service: Cardiovascular;  Laterality: N/A;  . Cardioversion N/A 06/20/2013    Procedure: CARDIOVERSION;  Surgeon: Dorothy Spark, MD;  Location: [facility] ENDOSCOPY;  Service: Cardiovascular;  Laterality: N/A;     Family History  Problem Relation Age of Onset  . Hypertension Mother   . Diabetes Mother   . Coronary artery disease Father   . Diabetes Brother   . Hypertension Brother   . Lung cancer Brother   . Arthritis Other   . Diabetes Other   . Heart disease Other     female < 28     History   Social History  . Marital Status: Married    Spouse Name: N/A    Number of  Children: N/A  . Years of Education: N/A   Occupational History  . Not on file.   Social History Main Topics  . Smoking status: Former Smoker -- 12 years    Types: Cigarettes  . Smokeless tobacco: Never Used     Comment: 05/2013: Smokes an occasional cigarette.  Says that she doesn't inhale.  . Alcohol Use:  No  . Drug Use: No  . Sexual Activity: Not Currently    Birth Control/ Protection: None   Other Topics Concern  . Not on file   Social History Narrative   Married, lives in [place] with spouse. Retired Secretary/administrator.      BP 80/42  Pulse 76  Ht 5\' 5"  (1.651 m)  Wt 153 lb (69.4 kg)  BMI 25.46 kg/m2  Physical Exam:  Chronically ill appearing 68 yo woman, NAD HEENT: Unremarkable Neck:  No JVD, no thyromegally Back:  No CVA tenderness Lungs:  Clear with no wheezes HEART:  Regular rate rhythm, no murmurs, no rubs, no clicks Abd:  soft, positive bowel sounds, no organomegally, no rebound, no guarding Ext:  2 plus pulses, no edema, no cyanosis, no clubbing Skin:  No rashes no nodules Neuro:  CN II through XII intact, motor grossly intact   DEVICE  Normal device function.  See PaceArt for details.   Assess/Plan:

## 2014-03-14 NOTE — Assessment & Plan Note (Signed)
Her chronic systolic heart failure appears to have worsened. She has predominantly low output symptoms. Today we reduced her dose of carvedilol down to 12.5 mg twice a day as her systolic blood pressure was 80. She may require additional reduction in dose. Because her symptoms have become advanced, I've recommended that she be referred to her advanced heart failure clinic for dose adjustments in her medications.

## 2014-03-14 NOTE — Patient Instructions (Signed)
Your physician wants you to follow-up in: 12 months with Dr Knox Saliva will receive a reminder letter in the mail two months in advance. If you don't receive a letter, please call our office to schedule the follow-up appointment.  Remote monitoring is used to monitor your Pacemaker of ICD from home. This monitoring reduces the number of office visits required to check your device to one time per year. It allows [place] to keep an eye on the functioning of your device to ensure it is working properly. You are scheduled for a device check from home on 06/15/14 You may send your transmission at any time that day. If you have a wireless device, the transmission will be sent automatically. After your physician reviews your transmission, you will receive a postcard with your next transmission date.  You have been referred to Dr Haroldine Laws  Your physician has recommended you make the following change in your medication: 1) Decrease Carvedilol to 12.5mg  twice daily    Thank you for choosing [facility]!!

## 2014-04-11 ENCOUNTER — Encounter (HOSPITAL_COMMUNITY): Payer: Self-pay | Admitting: Cardiology

## 2014-04-11 ENCOUNTER — Ambulatory Visit (HOSPITAL_COMMUNITY)
Admission: RE | Admit: 2014-04-11 | Discharge: 2014-04-11 | Disposition: A | Discharge status: Home / Self Care | Payer: Medicare HMO | Source: Ambulatory Visit | Attending: Cardiology | Admitting: Cardiology

## 2014-04-11 VITALS — BP 112/60 | HR 87 | Wt 149.5 lb

## 2014-04-11 DIAGNOSIS — I480 Paroxysmal atrial fibrillation: Secondary | ICD-10-CM | POA: Insufficient documentation

## 2014-04-11 DIAGNOSIS — Z7901 Long term (current) use of anticoagulants: Secondary | ICD-10-CM | POA: Insufficient documentation

## 2014-04-11 DIAGNOSIS — K219 Gastro-esophageal reflux disease without esophagitis: Secondary | ICD-10-CM | POA: Diagnosis not present

## 2014-04-11 DIAGNOSIS — I10 Essential (primary) hypertension: Secondary | ICD-10-CM | POA: Insufficient documentation

## 2014-04-11 DIAGNOSIS — Z95810 Presence of automatic (implantable) cardiac defibrillator: Secondary | ICD-10-CM | POA: Insufficient documentation

## 2014-04-11 DIAGNOSIS — I129 Hypertensive chronic kidney disease with stage 1 through stage 4 chronic kidney disease, or unspecified chronic kidney disease: Secondary | ICD-10-CM | POA: Insufficient documentation

## 2014-04-11 DIAGNOSIS — N189 Chronic kidney disease, unspecified: Secondary | ICD-10-CM | POA: Insufficient documentation

## 2014-04-11 DIAGNOSIS — E785 Hyperlipidemia, unspecified: Secondary | ICD-10-CM | POA: Insufficient documentation

## 2014-04-11 DIAGNOSIS — D649 Anemia, unspecified: Secondary | ICD-10-CM | POA: Insufficient documentation

## 2014-04-11 DIAGNOSIS — Z86711 Personal history of pulmonary embolism: Secondary | ICD-10-CM | POA: Insufficient documentation

## 2014-04-11 DIAGNOSIS — I429 Cardiomyopathy, unspecified: Secondary | ICD-10-CM | POA: Insufficient documentation

## 2014-04-11 DIAGNOSIS — I5022 Chronic systolic (congestive) heart failure: Secondary | ICD-10-CM | POA: Insufficient documentation

## 2014-04-11 DIAGNOSIS — I428 Other cardiomyopathies: Secondary | ICD-10-CM | POA: Diagnosis present

## 2014-04-11 DIAGNOSIS — I509 Heart failure, unspecified: Secondary | ICD-10-CM

## 2014-04-11 DIAGNOSIS — I2699 Other pulmonary embolism without acute cor pulmonale: Secondary | ICD-10-CM

## 2014-04-11 LAB — CBC WITH DIFFERENTIAL/PLATELET
Basophils Absolute: 0 10*3/uL (ref 0.0–0.1)
Basophils Relative: 1 % (ref 0–1)
Eosinophils Absolute: 0.1 10*3/uL (ref 0.0–0.7)
Eosinophils Relative: 2 % (ref 0–5)
HCT: 30.9 % — ABNORMAL LOW (ref 36.0–46.0)
HEMOGLOBIN: 10.3 g/dL — AB (ref 12.0–15.0)
LYMPHS ABS: 1.3 10*3/uL (ref 0.7–4.0)
Lymphocytes Relative: 20 % (ref 12–46)
MCH: 31.7 pg (ref 26.0–34.0)
MCHC: 33.3 g/dL (ref 30.0–36.0)
MCV: 95.1 fL (ref 78.0–100.0)
MONOS PCT: 6 % (ref 3–12)
Monocytes Absolute: 0.4 10*3/uL (ref 0.1–1.0)
Neutro Abs: 4.6 10*3/uL (ref 1.7–7.7)
Neutrophils Relative %: 71 % (ref 43–77)
PLATELETS: 329 10*3/uL (ref 150–400)
RBC: 3.25 MIL/uL — AB (ref 3.87–5.11)
RDW: 14.7 % (ref 11.5–15.5)
WBC: 6.5 10*3/uL (ref 4.0–10.5)

## 2014-04-11 LAB — BASIC METABOLIC PANEL
ANION GAP: 15 (ref 5–15)
BUN: 44 mg/dL — AB (ref 6–23)
CHLORIDE: 98 meq/L (ref 96–112)
CO2: 27 meq/L (ref 19–32)
Calcium: 9.8 mg/dL (ref 8.4–10.5)
Creatinine, Ser: 2.3 mg/dL — ABNORMAL HIGH (ref 0.50–1.10)
GFR calc Af Amer: 24 mL/min — ABNORMAL LOW (ref >90)
GFR calc non Af Amer: 20 mL/min — ABNORMAL LOW (ref >90)
Glucose, Bld: 169 mg/dL — ABNORMAL HIGH (ref 70–99)
POTASSIUM: 4.7 meq/L (ref 3.7–5.3)
Sodium: 140 mEq/L (ref 137–147)

## 2014-04-11 LAB — DIGOXIN LEVEL: DIGOXIN LVL: 0.7 ng/mL — AB (ref 0.8–2.0)

## 2014-04-11 LAB — PRO B NATRIURETIC PEPTIDE: Pro B Natriuretic peptide (BNP): 8376 pg/mL — ABNORMAL HIGH (ref 0–125)

## 2014-04-11 MED ORDER — SPIRONOLACTONE 25 MG PO TABS: 12.5000 mg | ORAL_TABLET | Freq: Every day | ORAL | Status: DC

## 2014-04-11 NOTE — Progress Notes (Signed)
Patient ID: [name], female   DOB: 30-Mar-1946, 68 y.o.   MRN: [id number] PCP: Dr. Karie Kirks Cardiology: Dr. 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  She has a long history of cardiomyopathy, dating back to 1999.  At that time, she had coronary angiography showing no significant disease. She was initially followed by Dr Lattie Haw, later by Dr. Lovena Le. She had her initial CRT-D device in 2005.  This was removed in 2007 due to enterococcal bacteremia with vegetation.  She had Medtronic CRT- D device placed in 8/14.  She has had trouble with paroxysmal atrial fibrillation, most recently she was put on amiodarone and cardioverted in 12/14.  TEE in 12/14 showed EF 15% but RV appeared normal.   Patient has had slowly progressive symptoms.  Today, she is in NSR by exam.  She has not felt lightheadedness or tachypalpitations.  She gets short of breath walking around the grocery store but not walking around her house.  She can climb 1 flight of steps but is short of breath at the top.  No orthopnea or PND.  No chest pain.  She has generalized fatigue.  She is not very active.  She is unable to vacuum but can wash dishes.  Creatinine has been slowly rising.  She has a lot of left hip pain that has been chronic.    Labs (7/15): K 3.5, creatinine 1.78, hemoglobin 9.4,m BNP 24607 Labs (10/15): K 4.7, creatinine 2.3, digoxin 0.7, BNP 8376, HCT 30.9  Optivol shows fluid index below threshold but trending up and impedance trending down.   PMH:  1. Chronic systolic CHF: Nonischemic cardiomyopathy.  1999 had LHC with normal coronaries.  12/05 had Guidant BiV ICD placed. 5/07 had enterococcal bacteremia with ICD lead vegetation, device extracted.  8/14 had placement of Medtronic BiV ICD device.  Cardiolite (12/09) with multiple wall segments showing scarring, some inferior ischemia.  Echo  (6/14) with EF 15-20%, diffuse hypokinesis with some regionality, normal RV size and systolic function.  TEE (12/14) with EF 15%, normal RV size and systolic function.  2. PE: 2/07 after right THR.  3. Right THR 4. LBBB 5. GERD 6. Hyperlipidemia 7. HTN 8. Anemia: 12/14 had EGD that was normal, colonoscopy with 3 polyps removed.  9. CKD 10. OSA: Mild, did not tolerate OSA 11. Atrial fibrillation: Paroxysmal.  TEE-guided DCCV in 2/14, TEE-guided DCCV in 12/14 on amiodarone.    SH: Married, lives in [place], nonsmoker.   FH: Mother with MI.   ROS: All systems reviewed and negative except as per HPI.   Current Outpatient Prescriptions  Medication Sig Dispense Refill  . amiodarone (PACERONE) 200 MG tablet Take 1 tablet (200 mg total) by mouth daily.  90 tablet  3  . carvedilol (COREG) 25 MG tablet Take 0.5 tablets (12.5 mg total) by mouth 2 (two) times daily.  90 tablet  3  . colchicine 0.6 MG tablet Take 0.6 mg by mouth 2 (two) times daily.       . digoxin (LANOXIN) 0.125 MG tablet Take 0.5 tablets (0.0625 mg total) by mouth daily.  30 tablet  3  . furosemide (LASIX) 80 MG tablet Take 1 tablet (80 mg total) by mouth 2 (two) times daily.  60 tablet  3  . HYDROcodone-acetaminophen (NORCO) 10-325 MG per tablet Take 1 tablet by mouth every 8 (eight) hours as needed (pain).       Marland Kitchen  lisinopril (PRINIVIL,ZESTRIL) 10 MG tablet Take 0.5 tablets (5 mg total) by mouth daily.  30 tablet  6  . metFORMIN (GLUCOPHAGE) 1000 MG tablet Take 1,000 mg by mouth 2 (two) times daily.      . metoCLOPramide (REGLAN) 5 MG tablet Take 5 mg by mouth 4 (four) times daily.      . ondansetron (ZOFRAN) 4 MG tablet Take 4 mg by mouth every 4 (four) hours as needed for nausea.       Marland Kitchen oxyCODONE-acetaminophen (PERCOCET) 10-325 MG per tablet Take 1 tablet by mouth every 6 (six) hours as needed for pain.       . polyethylene glycol powder (GLYCOLAX/MIRALAX) powder Take 17 g by mouth daily as needed (constipation).       .  potassium chloride SA (K-DUR,KLOR-CON) 20 MEQ tablet Take 1 tablet (20 mEq total) by mouth daily.  30 tablet  6  . pravastatin (PRAVACHOL) 40 MG tablet Take 80 mg by mouth at bedtime.       Marland Kitchen warfarin (COUMADIN) 5 MG tablet Take 2.5-5 mg by mouth daily.      . cetirizine (ZYRTEC) 10 MG tablet Take 10 mg by mouth daily.      Marland Kitchen spironolactone (ALDACTONE) 25 MG tablet Take 0.5 tablets (12.5 mg total) by mouth daily.  15 tablet  3   No current facility-administered medications for this encounter.    BP 112/60  Pulse 87  Wt 149 lb 8 oz (67.813 kg)  SpO2 94% General: NAD Neck: JVP 7 cm, no thyromegaly or thyroid nodule.  Lungs: Clear to auscultation bilaterally with normal respiratory effort. CV: Nondisplaced PMI.  Heart regular S1/S2, no S3/S4, 2/6 early SEM RUSB.  No peripheral edema.  No carotid bruit.  Normal pedal pulses.  Abdomen: Soft, nontender, no hepatosplenomegaly, no distention.  Skin: Intact without lesions or rashes.  Neurologic: Alert and oriented x 3.  Psych: Normal affect. Extremities: No clubbing or cyanosis.  HEENT: Normal.   Assessment/Plan: 1. Chronic systolic CHF: Patient has a nonischemic cardiomyopathy, most recent study was a TEE in 12/14 with EF 15% and normal RV size and systolic function.  NYHA class III to IIIb symptoms with prominent fatigue.  She does not appear markedly volume overloaded today on exam.  Weight is actually stable to lower.  However, Optivol suggests that she is starting to build up some fluid.  Creatinine today is up to 2.3 from 1.78.  - As I do not think she is particularly volume overloaded, I am going to decrease Lasix to 80 qam/40 qpm with significant rise in creatinine.  - Initially, I had planned to add spironolactone today, but will hold off on this with rise in creatinine and K.  Will try to add when creatinine more stable.  - Continue digoxin, level looks ok.  - Continue Coreg 12.5 mg bid and lisinopril at current dose.   - I am  concerned that [name] has developed a low output state with cardiorenal syndrome based on symptoms and labs.  I am going to arrange for RHC next week to measure filling pressures and cardiac output (hold coumadin 3 days prior to cath).  I would also like to get a CPX done.   We talked about the possible option of advanced therapies.  She would not be a transplant candidate but LVAD would be a potential option.   2. Atrial fibrillation: Paroxysmal.  She is in NSR by exam today.  She will continue amiodarone and coumadin.  She will need LFTs and TSH checked at next appointment.  She should have eye exam at least yearly while on amiodarone.  3. CKD: Creatinine higher today. Concern for cardiorenal component to creatinine rise.  As above, hold off on starting spironolactone and will lower Lasix a bit. Repeat BMET in 1 week.  4. Anemia: Hemoglobin is stable.  She had EGD and c-scope in 12/14.    Loralie Champagne 04/11/2014

## 2014-04-11 NOTE — Patient Instructions (Addendum)
Labs today  START Spironolactone 12.5mg  daily   Your physician has requested that you have a cardiac catheterization. Cardiac catheterization is used to diagnose and/or treat various heart conditions. Doctors may recommend this procedure for a number of different reasons. The most common reason is to evaluate chest pain. Chest pain can be a symptom of coronary artery disease (CAD), and cardiac catheterization can show whether plaque is narrowing or blocking your heart's arteries. This procedure is also used to evaluate the valves, as well as measure the blood flow and oxygen levels in different parts of your heart. For further information please visit [facility]. Please follow instruction sheet, as given.  Your physician has recommended that you have a cardiopulmonary stress test (CPX). CPX testing is a non-invasive measurement of heart and lung function. It replaces a traditional treadmill stress test. This type of test provides a tremendous amount of information that relates not only to your present condition but also for future outcomes. This test combines measurements of you ventilation, respiratory gas exchange in the lungs, electrocardiogram (EKG), blood pressure and physical response before, during, and following an exercise protocol.  Your physician recommends that you schedule a follow-up appointment in: 2 weeks with labs

## 2014-04-13 ENCOUNTER — Encounter (HOSPITAL_COMMUNITY): Payer: Self-pay | Admitting: Pharmacy Technician

## 2014-04-14 ENCOUNTER — Telehealth (HOSPITAL_COMMUNITY): Payer: Self-pay | Admitting: *Deleted

## 2014-04-14 MED ORDER — FUROSEMIDE 80 MG PO TABS: ORAL_TABLET | ORAL | Status: DC

## 2014-04-14 NOTE — Telephone Encounter (Signed)
Pt's husband aware of lab results and medication changes, pt will have labs next week at [facility]

## 2014-04-17 ENCOUNTER — Other Ambulatory Visit: Payer: Self-pay | Admitting: *Deleted

## 2014-04-17 ENCOUNTER — Other Ambulatory Visit (HOSPITAL_COMMUNITY): Payer: Self-pay | Admitting: Cardiology

## 2014-04-17 DIAGNOSIS — I5022 Chronic systolic (congestive) heart failure: Secondary | ICD-10-CM

## 2014-04-18 ENCOUNTER — Ambulatory Visit (HOSPITAL_COMMUNITY)
Admission: RE | Admit: 2014-04-18 | Discharge: 2014-04-18 | Disposition: A | Discharge status: Home / Self Care | Payer: Medicare HMO | Source: Ambulatory Visit | Attending: Cardiology | Admitting: Cardiology

## 2014-04-18 ENCOUNTER — Encounter (HOSPITAL_COMMUNITY)
Admission: RE | Disposition: A | Discharge status: Home / Self Care | Payer: Self-pay | Source: Ambulatory Visit | Attending: Cardiology

## 2014-04-18 ENCOUNTER — Telehealth (HOSPITAL_COMMUNITY): Payer: Self-pay | Admitting: Cardiology

## 2014-04-18 DIAGNOSIS — Z86711 Personal history of pulmonary embolism: Secondary | ICD-10-CM | POA: Insufficient documentation

## 2014-04-18 DIAGNOSIS — I509 Heart failure, unspecified: Secondary | ICD-10-CM

## 2014-04-18 DIAGNOSIS — G4733 Obstructive sleep apnea (adult) (pediatric): Secondary | ICD-10-CM | POA: Diagnosis not present

## 2014-04-18 DIAGNOSIS — I480 Paroxysmal atrial fibrillation: Secondary | ICD-10-CM | POA: Insufficient documentation

## 2014-04-18 DIAGNOSIS — I5022 Chronic systolic (congestive) heart failure: Secondary | ICD-10-CM | POA: Insufficient documentation

## 2014-04-18 DIAGNOSIS — I428 Other cardiomyopathies: Secondary | ICD-10-CM | POA: Diagnosis not present

## 2014-04-18 DIAGNOSIS — E785 Hyperlipidemia, unspecified: Secondary | ICD-10-CM | POA: Diagnosis not present

## 2014-04-18 DIAGNOSIS — K219 Gastro-esophageal reflux disease without esophagitis: Secondary | ICD-10-CM | POA: Diagnosis not present

## 2014-04-18 DIAGNOSIS — Z7901 Long term (current) use of anticoagulants: Secondary | ICD-10-CM | POA: Diagnosis not present

## 2014-04-18 DIAGNOSIS — I447 Left bundle-branch block, unspecified: Secondary | ICD-10-CM | POA: Diagnosis not present

## 2014-04-18 DIAGNOSIS — Z79891 Long term (current) use of opiate analgesic: Secondary | ICD-10-CM | POA: Insufficient documentation

## 2014-04-18 DIAGNOSIS — I129 Hypertensive chronic kidney disease with stage 1 through stage 4 chronic kidney disease, or unspecified chronic kidney disease: Secondary | ICD-10-CM | POA: Diagnosis not present

## 2014-04-18 DIAGNOSIS — N189 Chronic kidney disease, unspecified: Secondary | ICD-10-CM | POA: Diagnosis not present

## 2014-04-18 DIAGNOSIS — D649 Anemia, unspecified: Secondary | ICD-10-CM | POA: Insufficient documentation

## 2014-04-18 HISTORY — PX: RIGHT HEART CATHETERIZATION: SHX5447

## 2014-04-18 LAB — GLUCOSE, CAPILLARY: Glucose-Capillary: 144 mg/dL — ABNORMAL HIGH (ref 70–99)

## 2014-04-18 LAB — POCT I-STAT 3, VENOUS BLOOD GAS (G3P V)
Acid-Base Excess: 1 mmol/L (ref 0.0–2.0)
Bicarbonate: 26.3 mEq/L — ABNORMAL HIGH (ref 20.0–24.0)
O2 SAT: 63 %
PCO2 VEN: 44.2 mmHg — AB (ref 45.0–50.0)
PH VEN: 7.382 — AB (ref 7.250–7.300)
PO2 VEN: 33 mmHg (ref 30.0–45.0)
TCO2: 28 mmol/L (ref 0–100)

## 2014-04-18 LAB — BASIC METABOLIC PANEL
ANION GAP: 17 — AB (ref 5–15)
BUN: 57 mg/dL — ABNORMAL HIGH (ref 6–23)
CALCIUM: 10 mg/dL (ref 8.4–10.5)
CO2: 28 meq/L (ref 19–32)
Chloride: 96 mEq/L (ref 96–112)
Creatinine, Ser: 2.8 mg/dL — ABNORMAL HIGH (ref 0.50–1.10)
GFR calc Af Amer: 19 mL/min — ABNORMAL LOW (ref >90)
GFR calc non Af Amer: 16 mL/min — ABNORMAL LOW (ref >90)
Glucose, Bld: 147 mg/dL — ABNORMAL HIGH (ref 70–99)
POTASSIUM: 4.3 meq/L (ref 3.7–5.3)
SODIUM: 141 meq/L (ref 137–147)

## 2014-04-18 LAB — PROTIME-INR
INR: 3.8 — ABNORMAL HIGH (ref 0.00–1.49)
PROTHROMBIN TIME: 37.8 s — AB (ref 11.6–15.2)

## 2014-04-18 SURGERY — RIGHT HEART CATH
Anesthesia: LOCAL

## 2014-04-18 MED ORDER — ASPIRIN 81 MG PO CHEW
81.0000 mg | CHEWABLE_TABLET | ORAL | Status: AC
  Administered 2014-04-18: 81 mg via ORAL

## 2014-04-18 MED ORDER — SODIUM CHLORIDE 0.9 % IJ SOLN: 3.0000 mL | INTRAMUSCULAR | Status: DC | PRN

## 2014-04-18 MED ORDER — SODIUM CHLORIDE 0.9 % IV SOLN
250.0000 mL | INTRAVENOUS | Status: DC | PRN
  Administered 2014-04-18: 1000 mL via INTRAVENOUS

## 2014-04-18 MED ORDER — ACETAMINOPHEN 325 MG PO TABS: 650.0000 mg | ORAL_TABLET | ORAL | Status: DC | PRN

## 2014-04-18 MED ORDER — SODIUM CHLORIDE 0.9 % IJ SOLN: 3.0000 mL | Freq: Two times a day (BID) | INTRAMUSCULAR | Status: DC

## 2014-04-18 MED ORDER — LIDOCAINE HCL (PF) 1 % IJ SOLN
INTRAMUSCULAR | Status: AC
  Filled 2014-04-18: qty 30

## 2014-04-18 MED ORDER — ASPIRIN 81 MG PO CHEW
CHEWABLE_TABLET | ORAL | Status: AC
  Filled 2014-04-18: qty 1

## 2014-04-18 MED ORDER — FENTANYL CITRATE 0.05 MG/ML IJ SOLN
INTRAMUSCULAR | Status: AC
  Filled 2014-04-18: qty 2

## 2014-04-18 MED ORDER — ONDANSETRON HCL 4 MG/2ML IJ SOLN: 4.0000 mg | Freq: Four times a day (QID) | INTRAMUSCULAR | Status: DC | PRN

## 2014-04-18 MED ORDER — SODIUM CHLORIDE 0.9 % IV SOLN: INTRAVENOUS | Status: AC

## 2014-04-18 MED ORDER — HEPARIN (PORCINE) IN NACL 2-0.9 UNIT/ML-% IJ SOLN
INTRAMUSCULAR | Status: AC
  Filled 2014-04-18: qty 1000

## 2014-04-18 MED ORDER — MIDAZOLAM HCL 2 MG/2ML IJ SOLN
INTRAMUSCULAR | Status: AC
  Filled 2014-04-18: qty 2

## 2014-04-18 NOTE — Interval H&P Note (Signed)
History and Physical Interval Note:  04/18/2014 7:51 AM  [name]  has presented today for surgery, with the diagnosis of hf  The various methods of treatment have been discussed with the patient and family. After consideration of risks, benefits and other options for treatment, the patient has consented to  Procedure(s): RIGHT HEART CATH (N/A) as a surgical intervention .  The patient's history has been reviewed, patient examined, no change in status, stable for surgery.  I have reviewed the patient's chart and labs.  Questions were answered to the patient's satisfaction.     Patsey Pitstick [employer]

## 2014-04-18 NOTE — Discharge Instructions (Signed)
CALL IF ANY BLEEDING,DRAINAGE,FEVER,PAIN,SWELLING, OR REDNESS RIGHT ARM SITE; CALL IF ANY PROBLEMS,QUESTIONS, OR CONCERNS

## 2014-04-18 NOTE — Progress Notes (Signed)
Dr Aundra Dubin in and gave client medication instructions

## 2014-04-18 NOTE — CV Procedure (Signed)
    Cardiac Catheterization Procedure Note  Name: [name] MRN: [id number] DOB: [birth year]  Procedure: Right Heart Cath  Indication: CHF, ?low output.    Procedural Details: The right brachial area was prepped, draped, and anesthetized with 1% lidocaine. There was a pre-existing peripheral IV in the right brachial area that was replaced with a 5 French venous sheath.  A Swan-Ganz catheter was used for the right heart catheterization. Standard protocol was followed for recording of right heart pressures and sampling of oxygen saturations. Fick cardiac output was calculated. There were no immediate procedural complications. The patient was transferred to the post catheterization recovery area for further monitoring.  Procedural Findings: Hemodynamics (mmHg) RA mean 1 RV 23/1 PA 27/8 PCWP mean 5  Oxygen saturations: PA 63% AO 98%  Cardiac Output (Fick) 4.75  Cardiac Index (Fick) 2.71   Final Conclusions:  Low filling pressures suggesting patient is dry, fitting rise in creatinine she has had recently.  Good cardiac output.    Recommendations: Stop Lasix and lisinopril today, tomorrow, and Thursday.  Restart Lasix at 40 mg daily (decreased dose) and lisinopril at prior dose on Friday.  Will need BMET and office visit next week.  Will also need to followup with PCP tomorrow for INR monitoring and coumadin dosing.  INR 3.8 today so will hold coumadin until she has it rechecked tomorrow.   Loralie Champagne 04/18/2014, 8:12 AM

## 2014-04-18 NOTE — Telephone Encounter (Signed)
Pt scheduled for RHC on 04/18/14 Cpt [id number] With pts current insurance- [employer] Pre cert# [id number]

## 2014-04-18 NOTE — H&P (View-Only) (Signed)
Patient ID: [name], female   DOB: Oct 25, 1945, 68 y.o.   MRN: [id number] PCP: Dr. Karie Kirks Cardiology: Dr. 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  She has a long history of cardiomyopathy, dating back to 1999.  At that time, she had coronary angiography showing no significant disease. She was initially followed by Dr Lattie Haw, later by Dr. Lovena Le. She had her initial CRT-D device in 2005.  This was removed in 2007 due to enterococcal bacteremia with vegetation.  She had Medtronic CRT- D device placed in 8/14.  She has had trouble with paroxysmal atrial fibrillation, most recently she was put on amiodarone and cardioverted in 12/14.  TEE in 12/14 showed EF 15% but RV appeared normal.   Patient has had slowly progressive symptoms.  Today, she is in NSR by exam.  She has not felt lightheadedness or tachypalpitations.  She gets short of breath walking around the grocery store but not walking around her house.  She can climb 1 flight of steps but is short of breath at the top.  No orthopnea or PND.  No chest pain.  She has generalized fatigue.  She is not very active.  She is unable to vacuum but can wash dishes.  Creatinine has been slowly rising.  She has a lot of left hip pain that has been chronic.    Labs (7/15): K 3.5, creatinine 1.78, hemoglobin 9.4,m BNP 24607 Labs (10/15): K 4.7, creatinine 2.3, digoxin 0.7, BNP 8376, HCT 30.9  Optivol shows fluid index below threshold but trending up and impedance trending down.   PMH:  1. Chronic systolic CHF: Nonischemic cardiomyopathy.  1999 had LHC with normal coronaries.  12/05 had Guidant BiV ICD placed. 5/07 had enterococcal bacteremia with ICD lead vegetation, device extracted.  8/14 had placement of Medtronic BiV ICD device.  Cardiolite (12/09) with multiple wall segments showing scarring, some inferior ischemia.  Echo  (6/14) with EF 15-20%, diffuse hypokinesis with some regionality, normal RV size and systolic function.  TEE (12/14) with EF 15%, normal RV size and systolic function.  2. PE: 2/07 after right THR.  3. Right THR 4. LBBB 5. GERD 6. Hyperlipidemia 7. HTN 8. Anemia: 12/14 had EGD that was normal, colonoscopy with 3 polyps removed.  9. CKD 10. OSA: Mild, did not tolerate OSA 11. Atrial fibrillation: Paroxysmal.  TEE-guided DCCV in 2/14, TEE-guided DCCV in 12/14 on amiodarone.    SH: Married, lives in [place], nonsmoker.   FH: Mother with MI.   ROS: All systems reviewed and negative except as per HPI.   Current Outpatient Prescriptions  Medication Sig Dispense Refill  . amiodarone (PACERONE) 200 MG tablet Take 1 tablet (200 mg total) by mouth daily.  90 tablet  3  . carvedilol (COREG) 25 MG tablet Take 0.5 tablets (12.5 mg total) by mouth 2 (two) times daily.  90 tablet  3  . colchicine 0.6 MG tablet Take 0.6 mg by mouth 2 (two) times daily.       . digoxin (LANOXIN) 0.125 MG tablet Take 0.5 tablets (0.0625 mg total) by mouth daily.  30 tablet  3  . furosemide (LASIX) 80 MG tablet Take 1 tablet (80 mg total) by mouth 2 (two) times daily.  60 tablet  3  . HYDROcodone-acetaminophen (NORCO) 10-325 MG per tablet Take 1 tablet by mouth every 8 (eight) hours as needed (pain).       Marland Kitchen  lisinopril (PRINIVIL,ZESTRIL) 10 MG tablet Take 0.5 tablets (5 mg total) by mouth daily.  30 tablet  6  . metFORMIN (GLUCOPHAGE) 1000 MG tablet Take 1,000 mg by mouth 2 (two) times daily.      . metoCLOPramide (REGLAN) 5 MG tablet Take 5 mg by mouth 4 (four) times daily.      . ondansetron (ZOFRAN) 4 MG tablet Take 4 mg by mouth every 4 (four) hours as needed for nausea.       Marland Kitchen oxyCODONE-acetaminophen (PERCOCET) 10-325 MG per tablet Take 1 tablet by mouth every 6 (six) hours as needed for pain.       . polyethylene glycol powder (GLYCOLAX/MIRALAX) powder Take 17 g by mouth daily as needed (constipation).       .  potassium chloride SA (K-DUR,KLOR-CON) 20 MEQ tablet Take 1 tablet (20 mEq total) by mouth daily.  30 tablet  6  . pravastatin (PRAVACHOL) 40 MG tablet Take 80 mg by mouth at bedtime.       Marland Kitchen warfarin (COUMADIN) 5 MG tablet Take 2.5-5 mg by mouth daily.      . cetirizine (ZYRTEC) 10 MG tablet Take 10 mg by mouth daily.      Marland Kitchen spironolactone (ALDACTONE) 25 MG tablet Take 0.5 tablets (12.5 mg total) by mouth daily.  15 tablet  3   No current facility-administered medications for this encounter.    BP 112/60  Pulse 87  Wt 149 lb 8 oz (67.813 kg)  SpO2 94% General: NAD Neck: JVP 7 cm, no thyromegaly or thyroid nodule.  Lungs: Clear to auscultation bilaterally with normal respiratory effort. CV: Nondisplaced PMI.  Heart regular S1/S2, no S3/S4, 2/6 early SEM RUSB.  No peripheral edema.  No carotid bruit.  Normal pedal pulses.  Abdomen: Soft, nontender, no hepatosplenomegaly, no distention.  Skin: Intact without lesions or rashes.  Neurologic: Alert and oriented x 3.  Psych: Normal affect. Extremities: No clubbing or cyanosis.  HEENT: Normal.   Assessment/Plan: 1. Chronic systolic CHF: Patient has a nonischemic cardiomyopathy, most recent study was a TEE in 12/14 with EF 15% and normal RV size and systolic function.  NYHA class III to IIIb symptoms with prominent fatigue.  She does not appear markedly volume overloaded today on exam.  Weight is actually stable to lower.  However, Optivol suggests that she is starting to build up some fluid.  Creatinine today is up to 2.3 from 1.78.  - As I do not think she is particularly volume overloaded, I am going to decrease Lasix to 80 qam/40 qpm with significant rise in creatinine.  - Initially, I had planned to add spironolactone today, but will hold off on this with rise in creatinine and K.  Will try to add when creatinine more stable.  - Continue digoxin, level looks ok.  - Continue Coreg 12.5 mg bid and lisinopril at current dose.   - I am  concerned that Mrs Wedel has developed a low output state with cardiorenal syndrome based on symptoms and labs.  I am going to arrange for RHC next week to measure filling pressures and cardiac output (hold coumadin 3 days prior to cath).  I would also like to get a CPX done.   We talked about the possible option of advanced therapies.  She would not be a transplant candidate but LVAD would be a potential option.   2. Atrial fibrillation: Paroxysmal.  She is in NSR by exam today.  She will continue amiodarone and coumadin.  She will need LFTs and TSH checked at next appointment.  She should have eye exam at least yearly while on amiodarone.  3. CKD: Creatinine higher today. Concern for cardiorenal component to creatinine rise.  As above, hold off on starting spironolactone and will lower Lasix a bit. Repeat BMET in 1 week.  4. Anemia: Hemoglobin is stable.  She had EGD and c-scope in 12/14.    Loralie Champagne 04/11/2014

## 2014-04-27 ENCOUNTER — Ambulatory Visit (HOSPITAL_COMMUNITY)
Admission: RE | Admit: 2014-04-27 | Discharge: 2014-04-27 | Disposition: A | Discharge status: Home / Self Care | Payer: Medicare HMO | Source: Ambulatory Visit | Attending: Cardiology | Admitting: Cardiology

## 2014-04-27 ENCOUNTER — Encounter (HOSPITAL_COMMUNITY): Payer: Self-pay

## 2014-04-27 DIAGNOSIS — Z95810 Presence of automatic (implantable) cardiac defibrillator: Secondary | ICD-10-CM | POA: Insufficient documentation

## 2014-04-27 DIAGNOSIS — I129 Hypertensive chronic kidney disease with stage 1 through stage 4 chronic kidney disease, or unspecified chronic kidney disease: Secondary | ICD-10-CM | POA: Insufficient documentation

## 2014-04-27 DIAGNOSIS — N189 Chronic kidney disease, unspecified: Secondary | ICD-10-CM | POA: Diagnosis not present

## 2014-04-27 DIAGNOSIS — Z7901 Long term (current) use of anticoagulants: Secondary | ICD-10-CM | POA: Insufficient documentation

## 2014-04-27 DIAGNOSIS — I5022 Chronic systolic (congestive) heart failure: Secondary | ICD-10-CM | POA: Diagnosis not present

## 2014-04-27 DIAGNOSIS — I429 Cardiomyopathy, unspecified: Secondary | ICD-10-CM | POA: Diagnosis not present

## 2014-04-27 DIAGNOSIS — Z87891 Personal history of nicotine dependence: Secondary | ICD-10-CM | POA: Insufficient documentation

## 2014-04-27 DIAGNOSIS — I480 Paroxysmal atrial fibrillation: Secondary | ICD-10-CM | POA: Diagnosis not present

## 2014-04-27 DIAGNOSIS — Z86711 Personal history of pulmonary embolism: Secondary | ICD-10-CM | POA: Insufficient documentation

## 2014-04-27 DIAGNOSIS — D649 Anemia, unspecified: Secondary | ICD-10-CM | POA: Insufficient documentation

## 2014-04-27 DIAGNOSIS — E118 Type 2 diabetes mellitus with unspecified complications: Secondary | ICD-10-CM | POA: Diagnosis not present

## 2014-04-27 LAB — BASIC METABOLIC PANEL
Anion gap: 14 (ref 5–15)
BUN: 33 mg/dL — ABNORMAL HIGH (ref 6–23)
CALCIUM: 9.7 mg/dL (ref 8.4–10.5)
CO2: 25 meq/L (ref 19–32)
Chloride: 100 mEq/L (ref 96–112)
Creatinine, Ser: 1.91 mg/dL — ABNORMAL HIGH (ref 0.50–1.10)
GFR calc Af Amer: 30 mL/min — ABNORMAL LOW (ref >90)
GFR calc non Af Amer: 25 mL/min — ABNORMAL LOW (ref >90)
Glucose, Bld: 131 mg/dL — ABNORMAL HIGH (ref 70–99)
Potassium: 5.3 mEq/L (ref 3.7–5.3)
SODIUM: 139 meq/L (ref 137–147)

## 2014-04-27 LAB — PRO B NATRIURETIC PEPTIDE: PRO B NATRI PEPTIDE: 14381 pg/mL — AB (ref 0–125)

## 2014-04-27 MED ORDER — FUROSEMIDE 80 MG PO TABS: 40.0000 mg | ORAL_TABLET | Freq: Every day | ORAL | Status: DC

## 2014-04-27 NOTE — Patient Instructions (Addendum)
Stop Metformin due to renal insuffiencey, discuss with your Primary Care MD  If wt is 156 lb or great take an extra 1/2 tab of Lasix  Labs today  Start new Medication, Corlanor 2.5 mg Twice daily, we will get this medication pre-authorized with your insurance company first  Your physician recommends that you schedule a follow-up appointment in: 4 week

## 2014-04-27 NOTE — Progress Notes (Signed)
Patient ID: [name], female   DOB: 05-10-1946, 68 y.o.   MRN: [id number] PCP: Dr. Karie Kirks Cardiology: Dr. 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  She has a long history of cardiomyopathy, dating back to 1999.  At that time, she had coronary angiography showing no significant disease. She was initially followed by Dr Lattie Haw, later by Dr. Lovena Le. She had her initial CRT-D device in 2005.  This was removed in 2007 due to enterococcal bacteremia with vegetation.  She had Medtronic CRT- D device placed in 8/14.  She has had trouble with paroxysmal atrial fibrillation, most recently she was put on amiodarone and cardioverted in 12/14.  TEE in 12/14 showed EF 15% but RV appeared normal.   Had RHC on 10/27 for possible low output. This showed volume depletion with normal cardiac output. Lasix held for a few days then cut back  From 80 bid to 40 bid. Lisinopril also help but has been restarted.   Follow-up: Here with her husband. Feels pretty good today. Has trouble with arthritis and has to use a walker or cane. Ambulation is limited mostly by stiffness and not her breathing. Weight up and down at home. Typically weight at home 148 pounds. When she gets to 153 will take extra lasix. Occasional edema. Weight up 5 pounds here since cath  Labs (7/15): K 3.5, creatinine 1.78, hemoglobin 9.4,m BNP 24607 Labs (10/15): K 4.7, creatinine 2.3, digoxin 0.7, BNP 8376, HCT 30.9 Labs (04/18/14): K 4.3 creatinine 2.8   Optivol shows fluid index fluid trending up slightly but below baseline.  Activity index almost 0. No AF/VT. 100% CRT  PMH:  1. Chronic systolic CHF: Nonischemic cardiomyopathy.  1999 had LHC with normal coronaries.  12/05 had Guidant BiV ICD placed. 5/07 had enterococcal bacteremia with ICD lead vegetation, device extracted.  8/14 had placement of Medtronic BiV ICD  device.  Cardiolite (12/09) with multiple wall segments showing scarring, some inferior ischemia.  Echo (6/14) with EF 15-20%, diffuse hypokinesis with some regionality, normal RV size and systolic function.  TEE (12/14) with EF 15%, normal RV size and systolic function.  2. PE: 2/07 after right THR.  3. Right THR 4. LBBB 5. GERD 6. Hyperlipidemia 7. HTN 8. Anemia: 12/14 had EGD that was normal, colonoscopy with 3 polyps removed.  9. CKD 10. OSA: Mild, did not tolerate OSA 11. Atrial fibrillation: Paroxysmal.  TEE-guided DCCV in 2/14, TEE-guided DCCV in 12/14 on amiodarone.    SH: Married, lives in [place], previous smoker.   FH: Mother with MI.   ROS: All systems reviewed and negative except as per HPI.   Current Outpatient Prescriptions  Medication Sig Dispense Refill  . amiodarone (PACERONE) 200 MG tablet Take 1 tablet (200 mg total) by mouth daily. 90 tablet 3  . carvedilol (COREG) 25 MG tablet Take 12.5 mg by mouth 2 (two) times daily with a meal.    . colchicine 0.6 MG tablet Take 0.6 mg by mouth 2 (two) times daily.     . digoxin (LANOXIN) 0.125 MG tablet Take 0.5 tablets (0.0625 mg total) by mouth daily. 30 tablet 3  . furosemide (LASIX) 80 MG tablet Take 1 tab in AM and 1/2 tab in PM (Patient taking differently: Take 40 mg by mouth daily. Take 1 tab in AM and 1/2 tab in PM) 60 tablet 3  . HYDROcodone-acetaminophen (NORCO) 10-325 MG per tablet  Take 1 tablet by mouth every 8 (eight) hours as needed (pain).     Marland Kitchen lisinopril (PRINIVIL,ZESTRIL) 10 MG tablet Take 0.5 tablets (5 mg total) by mouth daily. 30 tablet 6  . metFORMIN (GLUCOPHAGE) 1000 MG tablet Take 1,000 mg by mouth 2 (two) times daily.    . metoCLOPramide (REGLAN) 5 MG tablet Take 5 mg by mouth 4 (four) times daily.    . ondansetron (ZOFRAN) 4 MG tablet Take 4 mg by mouth every 4 (four) hours as needed for nausea.     Marland Kitchen oxyCODONE-acetaminophen (PERCOCET) 10-325 MG per tablet Take 1 tablet by mouth every 6 (six) hours  as needed for pain.     . polyethylene glycol powder (GLYCOLAX/MIRALAX) powder Take 17 g by mouth daily as needed (constipation).     . potassium chloride SA (K-DUR,KLOR-CON) 20 MEQ tablet Take 1 tablet (20 mEq total) by mouth daily. 30 tablet 6  . pravastatin (PRAVACHOL) 40 MG tablet Take 80 mg by mouth at bedtime.     Marland Kitchen warfarin (COUMADIN) 5 MG tablet Take 2.5-5 mg by mouth daily. Pt takes 2.5 mg on mon, tue, 5 mg on wed, 2.5 mg thu, fri, and 5 mg on sat, sun     No current facility-administered medications for this encounter.    BP 118/62 mmHg  Pulse 90  Wt 153 lb 12.8 oz (69.763 kg)  SpO2 96% General: NAD Neck: JVP 7 cm, no thyromegaly or thyroid nodule.  Lungs: Clear to auscultation bilaterally with normal respiratory effort. CV: Nondisplaced PMI.  Heart regular S1/S2, no S3/S4, 2/6 early SEM RUSB.  No peripheral edema.  No carotid bruit.  Normal pedal pulses.  Abdomen: Soft, nontender, no hepatosplenomegaly, no distention.  Skin: Intact without lesions or rashes.  Neurologic: Alert and oriented x 3.  Psych: Normal affect. Extremities: No clubbing or cyanosis. Walks bent over with marked limp on R hip  HEENT: Normal.   Assessment/Plan: 1. Chronic systolic CHF: Patient has a nonischemic cardiomyopathy, most recent study was a TEE in 12/14 with EF 15% and normal RV size and systolic function.  NYHA class III symptoms.  - Recent RHC shows low pressures and normal cardiac output. Lasix cut back to 40 bid and now weight up in better range. - Continue lasix 40 bid. Check labs today including BMET and Digoxin level. Can take extra lasix 40 if weight 156 or greater.  - Continue Coreg 12.5 mg bid and lisinopril at current dose.  No spiro now due to renal failure.  - Add corlanor 2.5 bid - based on her RHC and watching her ambulate I think her major limitation is musculoskeletal and not cardiac. Activity index on optivol is nearly 0. She is unable to do CPX testing and would not be VAD  candidate.  2. Atrial fibrillation: Paroxysmal.  She is in NSR by exam today.  She will continue amiodarone and coumadin.  She will need LFTs and TSH checked at next appointment.  She should have eye exam at least yearly while on amiodarone.  3. CKD: Creatinine higher on last check. Lasix changed. Weight now up. Recheck labs today. Need to stop metformin. Will f/u with PCP for DM2 regimen.  4. Anemia: Hemoglobin is stable.  She had EGD and c-scope in 12/14.    Glori Bickers MD 04/27/2014

## 2014-04-28 ENCOUNTER — Telehealth: Payer: Self-pay | Admitting: *Deleted

## 2014-04-28 NOTE — Telephone Encounter (Signed)
Pt and husband aware.

## 2014-04-28 NOTE — Telephone Encounter (Signed)
-----   Message from Jolaine Artist, MD sent at 04/27/2014 12:30 PM EST ----- Renal function improving on lower dose lasix. Continue current dose.

## 2014-05-05 ENCOUNTER — Encounter: Payer: Self-pay | Admitting: Cardiology

## 2014-05-30 ENCOUNTER — Ambulatory Visit (HOSPITAL_COMMUNITY)
Admission: RE | Admit: 2014-05-30 | Discharge: 2014-05-30 | Disposition: A | Discharge status: Home / Self Care | Payer: Commercial Managed Care - HMO | Source: Ambulatory Visit | Attending: Cardiology | Admitting: Cardiology

## 2014-05-30 ENCOUNTER — Encounter (HOSPITAL_COMMUNITY): Payer: Self-pay

## 2014-05-30 VITALS — BP 120/52 | HR 92 | Wt 155.8 lb

## 2014-05-30 DIAGNOSIS — I480 Paroxysmal atrial fibrillation: Secondary | ICD-10-CM | POA: Diagnosis not present

## 2014-05-30 DIAGNOSIS — N189 Chronic kidney disease, unspecified: Secondary | ICD-10-CM | POA: Insufficient documentation

## 2014-05-30 DIAGNOSIS — Z79899 Other long term (current) drug therapy: Secondary | ICD-10-CM | POA: Diagnosis not present

## 2014-05-30 DIAGNOSIS — I429 Cardiomyopathy, unspecified: Secondary | ICD-10-CM | POA: Insufficient documentation

## 2014-05-30 DIAGNOSIS — D649 Anemia, unspecified: Secondary | ICD-10-CM | POA: Insufficient documentation

## 2014-05-30 DIAGNOSIS — K219 Gastro-esophageal reflux disease without esophagitis: Secondary | ICD-10-CM | POA: Insufficient documentation

## 2014-05-30 DIAGNOSIS — Z7901 Long term (current) use of anticoagulants: Secondary | ICD-10-CM | POA: Diagnosis not present

## 2014-05-30 DIAGNOSIS — E785 Hyperlipidemia, unspecified: Secondary | ICD-10-CM | POA: Insufficient documentation

## 2014-05-30 DIAGNOSIS — G4733 Obstructive sleep apnea (adult) (pediatric): Secondary | ICD-10-CM | POA: Diagnosis not present

## 2014-05-30 DIAGNOSIS — I5022 Chronic systolic (congestive) heart failure: Secondary | ICD-10-CM | POA: Diagnosis present

## 2014-05-30 DIAGNOSIS — E8770 Fluid overload, unspecified: Secondary | ICD-10-CM | POA: Diagnosis not present

## 2014-05-30 DIAGNOSIS — Z87891 Personal history of nicotine dependence: Secondary | ICD-10-CM | POA: Insufficient documentation

## 2014-05-30 DIAGNOSIS — N183 Chronic kidney disease, stage 3 unspecified: Secondary | ICD-10-CM

## 2014-05-30 DIAGNOSIS — I129 Hypertensive chronic kidney disease with stage 1 through stage 4 chronic kidney disease, or unspecified chronic kidney disease: Secondary | ICD-10-CM | POA: Diagnosis not present

## 2014-05-30 DIAGNOSIS — I10 Essential (primary) hypertension: Secondary | ICD-10-CM | POA: Diagnosis not present

## 2014-05-30 LAB — CBC
HCT: 24.3 % — ABNORMAL LOW (ref 36.0–46.0)
HEMOGLOBIN: 7.9 g/dL — AB (ref 12.0–15.0)
MCH: 30.7 pg (ref 26.0–34.0)
MCHC: 32.5 g/dL (ref 30.0–36.0)
MCV: 94.6 fL (ref 78.0–100.0)
Platelets: 223 10*3/uL (ref 150–400)
RBC: 2.57 MIL/uL — ABNORMAL LOW (ref 3.87–5.11)
RDW: 14 % (ref 11.5–15.5)
WBC: 5.4 10*3/uL (ref 4.0–10.5)

## 2014-05-30 LAB — COMPREHENSIVE METABOLIC PANEL
ALK PHOS: 93 U/L (ref 39–117)
ALT: 10 U/L (ref 0–35)
AST: 12 U/L (ref 0–37)
Albumin: 3.8 g/dL (ref 3.5–5.2)
Anion gap: 15 (ref 5–15)
BILIRUBIN TOTAL: 0.6 mg/dL (ref 0.3–1.2)
BUN: 42 mg/dL — AB (ref 6–23)
CHLORIDE: 101 meq/L (ref 96–112)
CO2: 25 mEq/L (ref 19–32)
Calcium: 9.3 mg/dL (ref 8.4–10.5)
Creatinine, Ser: 2.21 mg/dL — ABNORMAL HIGH (ref 0.50–1.10)
GFR calc non Af Amer: 21 mL/min — ABNORMAL LOW (ref >90)
GFR, EST AFRICAN AMERICAN: 25 mL/min — AB (ref >90)
GLUCOSE: 208 mg/dL — AB (ref 70–99)
Potassium: 4.5 mEq/L (ref 3.7–5.3)
SODIUM: 141 meq/L (ref 137–147)
TOTAL PROTEIN: 7.2 g/dL (ref 6.0–8.3)

## 2014-05-30 LAB — PRO B NATRIURETIC PEPTIDE: PRO B NATRI PEPTIDE: 6690 pg/mL — AB (ref 0–125)

## 2014-05-30 LAB — DIGOXIN LEVEL: Digoxin Level: 1 ng/mL (ref 0.8–2.0)

## 2014-05-30 LAB — TSH: TSH: 14.15 u[IU]/mL — ABNORMAL HIGH (ref 0.350–4.500)

## 2014-05-30 MED ORDER — FUROSEMIDE 40 MG PO TABS: 60.0000 mg | ORAL_TABLET | Freq: Two times a day (BID) | ORAL | Status: DC

## 2014-05-30 NOTE — Patient Instructions (Addendum)
Increase Furosemide (Lasix) to 60 mg (1 & 1/2 tabs) twice daily   Labs today  Labs in 2 weeks  Your physician recommends that you schedule a follow-up appointment in: 1 month

## 2014-05-30 NOTE — Progress Notes (Signed)
Patient ID: [name], female   DOB: 11/07/1945, 68 y.o.   MRN: [id number] PCP: Dr. Karie Kirks Cardiology: Dr. 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  She has a long history of cardiomyopathy, dating back to 1999.  At that time, she had coronary angiography showing no significant disease. She was initially followed by Dr Lattie Haw, later by Dr. Lovena Le. She had her initial CRT-D device in 2005.  This was removed in 2007 due to enterococcal bacteremia with vegetation.  She had Medtronic CRT- D device placed in 8/14.  She has had trouble with paroxysmal atrial fibrillation, most recently she was put on amiodarone and cardioverted in 12/14.  TEE in 12/14 showed EF 15% but RV appeared normal.   Had RHC on 04/18/14 for possible low output. This showed volume depletion with normal cardiac output. Lasix held for a few days then cut back  From 80 bid to 40 bid. Lisinopril also help but has been restarted.   Here with her husband today. Feels pretty good today. Has trouble with arthritis and has to use a walker or cane. Ambulation is limited mostly by stiffness and not her breathing.  She is not very active. Weight up 2 lbs on our scale. No orthopnea, PND, or chest pain.  She was supposed to start Corlanor but still trying to get insurance to cover it.   Labs (7/15): K 3.5, creatinine 1.78, hemoglobin 9.4,m BNP 24607 Labs (10/15): K 4.7, creatinine 2.3, digoxin 0.7, BNP 8376, HCT 30.9 Labs (04/18/14): K 4.3 creatinine 2.8  Labs (11/15): K 5.3, creatinine 1.9  Optivol shows fluid index above threshold with impedance trending down. Not very active. No AF/VT. 100% CRT  PMH:  1. Chronic systolic CHF: Nonischemic cardiomyopathy.  1999 had LHC with normal coronaries.  12/05 had Guidant BiV ICD placed. 5/07 had enterococcal bacteremia with ICD lead vegetation, device extracted.  8/14 had  placement of Medtronic BiV ICD device.  Cardiolite (12/09) with multiple wall segments showing scarring, some inferior ischemia.  Echo (6/14) with EF 15-20%, diffuse hypokinesis with some regionality, normal RV size and systolic function.  TEE (12/14) with EF 15%, normal RV size and systolic function. RHC (10/15) with mean RA 1, PA 27/8, mean PCWP 5, CI 2.71. 2. PE: 2/07 after right THR.  3. Right THR 4. LBBB 5. GERD 6. Hyperlipidemia 7. HTN 8. Anemia: 12/14 had EGD that was normal, colonoscopy with 3 polyps removed.  9. CKD 10. OSA: Mild, did not tolerate OSA 11. Atrial fibrillation: Paroxysmal.  TEE-guided DCCV in 2/14, TEE-guided DCCV in 12/14 on amiodarone.    SH: Married, lives in [place], previous smoker.   FH: Mother with MI.   ROS: All systems reviewed and negative except as per HPI.   Current Outpatient Prescriptions  Medication Sig Dispense Refill  . amiodarone (PACERONE) 200 MG tablet Take 1 tablet (200 mg total) by mouth daily. 90 tablet 3  . carvedilol (COREG) 25 MG tablet Take 12.5 mg by mouth 2 (two) times daily with a meal.    . colchicine 0.6 MG tablet Take 0.6 mg by mouth 2 (two) times daily.     . digoxin (LANOXIN) 0.125 MG tablet Take 0.5 tablets (0.0625 mg total) by mouth daily. 30 tablet 3  . furosemide (LASIX) 40 MG tablet Take 1.5 tablets (60 mg total) by mouth 2 (two) times daily. 90 tablet 3  . HYDROcodone-acetaminophen (Armstrong)  10-325 MG per tablet Take 1 tablet by mouth every 8 (eight) hours as needed (pain).     Marland Kitchen lisinopril (PRINIVIL,ZESTRIL) 10 MG tablet Take 0.5 tablets (5 mg total) by mouth daily. 30 tablet 6  . metoCLOPramide (REGLAN) 5 MG tablet Take 5 mg by mouth 4 (four) times daily.    . ondansetron (ZOFRAN) 4 MG tablet Take 4 mg by mouth every 4 (four) hours as needed for nausea.     Marland Kitchen oxyCODONE-acetaminophen (PERCOCET) 10-325 MG per tablet Take 1 tablet by mouth every 6 (six) hours as needed for pain.     . polyethylene glycol powder  (GLYCOLAX/MIRALAX) powder Take 17 g by mouth daily as needed (constipation).     . pravastatin (PRAVACHOL) 40 MG tablet Take 80 mg by mouth at bedtime.     Marland Kitchen warfarin (COUMADIN) 5 MG tablet Take 2.5-5 mg by mouth daily. Pt takes 2.5 mg on mon, tue, 5 mg on wed, 2.5 mg thu, fri, and 5 mg on sat, sun     No current facility-administered medications for this encounter.    BP 120/52 mmHg  Pulse 92  Wt 155 lb 12.8 oz (70.67 kg)  SpO2 96% General: NAD Neck: JVP 8 cm, no thyromegaly or thyroid nodule.  Lungs: Clear to auscultation bilaterally with normal respiratory effort. CV: Nondisplaced PMI.  Heart regular S1/S2, no S3/S4, 1/6 early SEM RUSB.  No peripheral edema.  No carotid bruit.  Normal pedal pulses.  Abdomen: Soft, nontender, no hepatosplenomegaly, no distention.  Skin: Intact without lesions or rashes.  Neurologic: Alert and oriented x 3.  Psych: Normal affect. Extremities: No clubbing or cyanosis. Walks bent over with marked limp on R hip  HEENT: Normal.   Assessment/Plan: 1. Chronic systolic CHF: Patient has a nonischemic cardiomyopathy, most recent study was a TEE in 12/14 with EF 15% and normal RV size and systolic function.  NYHA class III symptoms.  RHC (10/15) showed low filling pressures and normal cardiac output, Lasix was cut back. - Patient is volume overloaded by exam and Optivol, and weight is up. Increase Lasix to 60 mg bid with BMET in 2 wks.  - Continue Coreg 12.5 mg bid and lisinopril at current dose.  No spironolactone until creatinine has stabilized.  - I will continue to try to get her Corlanor 2.5 bid - Based on her RHC and watching her ambulate I think her major limitation is musculoskeletal and not cardiac. Activity index on optivol is still very low. She is unable to do CPX testing and would not be LVAD candidate.  2. Atrial fibrillation: Paroxysmal.  She is in NSR by exam today.  She will continue amiodarone and coumadin.  She will need LFTs and TSH today.  She  should have eye exam at least yearly while on amiodarone.  3. CKD: Check BMET today, folllow closely. 4. Anemia: Check CBC today.  She had EGD and c-scope in 12/14.    Followup in 1 month to reassess volume.   Loralie Champagne MD 05/30/2014

## 2014-05-31 ENCOUNTER — Encounter: Payer: Self-pay | Admitting: Internal Medicine

## 2014-06-01 ENCOUNTER — Encounter (HOSPITAL_COMMUNITY): Payer: Self-pay | Admitting: Internal Medicine

## 2014-06-02 ENCOUNTER — Telehealth (HOSPITAL_COMMUNITY): Payer: Self-pay | Admitting: *Deleted

## 2014-06-02 DIAGNOSIS — I4891 Unspecified atrial fibrillation: Secondary | ICD-10-CM

## 2014-06-02 DIAGNOSIS — I4819 Other persistent atrial fibrillation: Secondary | ICD-10-CM

## 2014-06-02 DIAGNOSIS — I5022 Chronic systolic (congestive) heart failure: Secondary | ICD-10-CM

## 2014-06-02 DIAGNOSIS — I43 Cardiomyopathy in diseases classified elsewhere: Secondary | ICD-10-CM

## 2014-06-02 DIAGNOSIS — I10 Essential (primary) hypertension: Secondary | ICD-10-CM

## 2014-06-02 MED ORDER — LISINOPRIL 2.5 MG PO TABS: 2.5000 mg | ORAL_TABLET | Freq: Every day | ORAL | Status: DC

## 2014-06-02 NOTE — Telephone Encounter (Signed)
Spoke w/pt and her husband, they are aware to decrease Lisinopril and new rx for 2.5 mg tabs sent to pharmacy.  They states pt did have some blood in her stool and Dr Karie Kirks her pcp was arranging for her to see GI and have colonoscopy, will forward labs to him for f/u.  Order faxed to [facility] lab for tsh, t3, t4, he will take pt there on Mon for labs

## 2014-06-02 NOTE — Telephone Encounter (Signed)
-----   Message from Larey Dresser, MD sent at 05/31/2014  5:11 PM EST ----- Creatinine higher but not off too much from where it has been in the past.  Decrease lisinopril to 2.5 mg daily.  TSH is high.  She needs to have repeat TSH with free T4 and free T3 done, may have thyroid problems related to the amiodarone.  Hemoglobin is lower.  Has history of anemia.  She has a gastroenterologist, would like to get her followup with gastroenterologist ASAP.  Make she she has no signs of active bleeding.

## 2014-06-06 ENCOUNTER — Telehealth (HOSPITAL_COMMUNITY): Payer: Self-pay | Admitting: Cardiology

## 2014-06-06 NOTE — Telephone Encounter (Signed)
Received fax from [facility] labs report date 06/06/14 ABNORMAL results TSH- 7.030 H FT4- 0.90 FT3- 2.2   L   Will forward to Dr.McLean as Amiodarone may need to be d/c'd Pt is currently taking Amiodarone 200 mg daily Please advise

## 2014-06-08 ENCOUNTER — Encounter (HOSPITAL_COMMUNITY)
Admission: RE | Admit: 2014-06-08 | Discharge: 2014-06-08 | Disposition: A | Discharge status: Home / Self Care | Payer: Commercial Managed Care - HMO | Source: Ambulatory Visit | Attending: Family Medicine | Admitting: Family Medicine

## 2014-06-08 DIAGNOSIS — N189 Chronic kidney disease, unspecified: Secondary | ICD-10-CM | POA: Diagnosis not present

## 2014-06-08 DIAGNOSIS — D631 Anemia in chronic kidney disease: Secondary | ICD-10-CM | POA: Insufficient documentation

## 2014-06-08 LAB — PREPARE RBC (CROSSMATCH)

## 2014-06-08 LAB — ABO/RH: ABO/RH(D): A NEG

## 2014-06-08 LAB — HEMOGLOBIN AND HEMATOCRIT, BLOOD
HCT: 25.4 % — ABNORMAL LOW (ref 36.0–46.0)
Hemoglobin: 8.3 g/dL — ABNORMAL LOW (ref 12.0–15.0)

## 2014-06-08 MED ORDER — SODIUM CHLORIDE 0.9 % IV SOLN
Freq: Once | INTRAVENOUS | Status: AC
  Administered 2014-06-08: 1000 mL via INTRAVENOUS

## 2014-06-08 MED ORDER — SODIUM CHLORIDE 0.9 % IV SOLN: Freq: Once | INTRAVENOUS | Status: DC

## 2014-06-09 LAB — TYPE AND SCREEN
ABO/RH(D): A NEG
Antibody Screen: NEGATIVE
Unit division: 0

## 2014-06-15 ENCOUNTER — Encounter: Payer: Medicare HMO | Admitting: *Deleted

## 2014-06-21 ENCOUNTER — Encounter: Payer: Self-pay | Admitting: Cardiology

## 2014-07-04 ENCOUNTER — Encounter: Payer: Self-pay | Admitting: Cardiology

## 2014-07-05 ENCOUNTER — Telehealth (HOSPITAL_COMMUNITY): Payer: Self-pay | Admitting: Cardiology

## 2014-07-05 DIAGNOSIS — I5022 Chronic systolic (congestive) heart failure: Secondary | ICD-10-CM

## 2014-07-05 MED ORDER — IVABRADINE HCL 5 MG PO TABS: 2.5000 mg | ORAL_TABLET | Freq: Two times a day (BID) | ORAL | Status: DC

## 2014-07-05 NOTE — Telephone Encounter (Signed)
Fax received for [employer] approval for corlanor vaild 07/04/14-06/23/2015 Copy of approval faxed to pharmacy along with rx

## 2014-07-07 ENCOUNTER — Encounter (HOSPITAL_COMMUNITY): Payer: Commercial Managed Care - HMO

## 2014-07-21 ENCOUNTER — Encounter: Payer: Self-pay | Admitting: *Deleted

## 2014-07-25 ENCOUNTER — Ambulatory Visit (INDEPENDENT_AMBULATORY_CARE_PROVIDER_SITE_OTHER): Payer: Medicare HMO | Admitting: Urology

## 2014-07-25 DIAGNOSIS — R351 Nocturia: Secondary | ICD-10-CM

## 2014-07-25 DIAGNOSIS — R32 Unspecified urinary incontinence: Secondary | ICD-10-CM

## 2014-07-25 DIAGNOSIS — N3944 Nocturnal enuresis: Secondary | ICD-10-CM

## 2014-07-25 DIAGNOSIS — R350 Frequency of micturition: Secondary | ICD-10-CM

## 2014-08-03 ENCOUNTER — Encounter (INDEPENDENT_AMBULATORY_CARE_PROVIDER_SITE_OTHER): Payer: Self-pay | Admitting: *Deleted

## 2014-08-17 ENCOUNTER — Encounter: Payer: Self-pay | Admitting: *Deleted

## 2014-08-28 ENCOUNTER — Ambulatory Visit (INDEPENDENT_AMBULATORY_CARE_PROVIDER_SITE_OTHER): Payer: Medicare HMO | Admitting: Internal Medicine

## 2014-09-08 ENCOUNTER — Encounter: Payer: Self-pay | Admitting: Internal Medicine

## 2014-09-08 ENCOUNTER — Ambulatory Visit (INDEPENDENT_AMBULATORY_CARE_PROVIDER_SITE_OTHER): Payer: Commercial Managed Care - HMO | Admitting: *Deleted

## 2014-09-08 DIAGNOSIS — I428 Other cardiomyopathies: Secondary | ICD-10-CM

## 2014-09-08 DIAGNOSIS — I429 Cardiomyopathy, unspecified: Secondary | ICD-10-CM

## 2014-09-08 DIAGNOSIS — I5022 Chronic systolic (congestive) heart failure: Secondary | ICD-10-CM

## 2014-09-08 DIAGNOSIS — Z95810 Presence of automatic (implantable) cardiac defibrillator: Secondary | ICD-10-CM

## 2014-09-08 LAB — MDC_IDC_ENUM_SESS_TYPE_INCLINIC
Battery Remaining Longevity: 72 mo
Battery Voltage: 2.98 V
Brady Statistic AP VP Percent: 98.43 %
Brady Statistic RA Percent Paced: 99.34 %
Brady Statistic RV Percent Paced: 93.54 %
Date Time Interrogation Session: 20160318093852
HighPow Impedance: 247 Ohm
HighPow Impedance: 53 Ohm
Lead Channel Impedance Value: 361 Ohm
Lead Channel Impedance Value: 361 Ohm
Lead Channel Impedance Value: 399 Ohm
Lead Channel Pacing Threshold Amplitude: 0.75 V
Lead Channel Pacing Threshold Amplitude: 0.75 V
Lead Channel Pacing Threshold Pulse Width: 0.4 ms
Lead Channel Sensing Intrinsic Amplitude: 1.625 mV
Lead Channel Sensing Intrinsic Amplitude: 22.375 mV
Lead Channel Sensing Intrinsic Amplitude: 26.625 mV
Lead Channel Setting Pacing Amplitude: 2.75 V
MDC IDC MSMT LEADCHNL LV IMPEDANCE VALUE: 589 Ohm
MDC IDC MSMT LEADCHNL LV PACING THRESHOLD AMPLITUDE: 1.75 V
MDC IDC MSMT LEADCHNL LV PACING THRESHOLD PULSEWIDTH: 0.4 ms
MDC IDC MSMT LEADCHNL RA PACING THRESHOLD PULSEWIDTH: 0.4 ms
MDC IDC MSMT LEADCHNL RA SENSING INTR AMPL: 1.125 mV
MDC IDC MSMT LEADCHNL RV IMPEDANCE VALUE: 551 Ohm
MDC IDC SET LEADCHNL LV PACING PULSEWIDTH: 0.4 ms
MDC IDC SET LEADCHNL RA PACING AMPLITUDE: 1.5 V
MDC IDC SET LEADCHNL RV PACING AMPLITUDE: 2 V
MDC IDC SET LEADCHNL RV PACING PULSEWIDTH: 0.4 ms
MDC IDC SET LEADCHNL RV SENSING SENSITIVITY: 0.3 mV
MDC IDC SET ZONE DETECTION INTERVAL: 360 ms
MDC IDC STAT BRADY AP VS PERCENT: 0.91 %
MDC IDC STAT BRADY AS VP PERCENT: 0.65 %
MDC IDC STAT BRADY AS VS PERCENT: 0.01 %
Zone Setting Detection Interval: 300 ms
Zone Setting Detection Interval: 350 ms
Zone Setting Detection Interval: 350 ms

## 2014-09-08 NOTE — Progress Notes (Signed)
CRT-D device check in office. Thresholds and sensing consistent with previous device measurements. Lead impedance trends stable over time. No mode switch episodes recorded. No ventricular arrhythmia episodes recorded. Patient bi-ventricularly pacing 98.8% of the time. Device programmed with appropriate safety margins. Heart failure diagnostics reviewed and trends are stable for patient---trending towards saturation---pt has been given previous furosemide instructions if congestive symptoms develop. Audible alerts demonstrated for patient, pt knows to call clinic if heard. No changes made this session. Estimated longevity 6.48yrs. [facility] 12/11/14 & ROV w/ Dr. Lovena Le in 69mo.

## 2014-09-12 ENCOUNTER — Telehealth (INDEPENDENT_AMBULATORY_CARE_PROVIDER_SITE_OTHER): Payer: Self-pay | Admitting: *Deleted

## 2014-09-12 ENCOUNTER — Encounter (INDEPENDENT_AMBULATORY_CARE_PROVIDER_SITE_OTHER): Payer: Self-pay | Admitting: *Deleted

## 2014-09-12 NOTE — Telephone Encounter (Signed)
Drema Pry SHOWED for apt with Deberah Castle, NP on 03/017/16. A NS letter has been mailed.

## 2014-09-19 ENCOUNTER — Encounter: Payer: Self-pay | Admitting: *Deleted

## 2014-09-19 NOTE — Patient Outreach (Signed)
[facility]) Care Management  09/19/2014  [name] 03/23/1946 [id number]  Previous notes in [facility] system.  [employer] Tier [employer] referral.  Patient has not responded to 3 phone call attempts and unable to contact letter.  Case will be closed out.  MD closure letter sent.   Sherrin Daisy, RN BSN [place] Management Coordinator [facility] Care Management  [phone]

## 2014-09-20 NOTE — Patient Outreach (Signed)
[place] [facility]) Care Management  09/20/2014  [name] 02/16/1946 [id number]   Received notification from Sherrin Daisy, RN to close case due to unable to contact patient for [employer].  Case closed at this time.  [name]. [place] CM Assistant Phone: [phone] Fax: [phone]

## 2014-09-25 ENCOUNTER — Encounter (HOSPITAL_COMMUNITY): Payer: Medicare HMO

## 2014-09-27 ENCOUNTER — Encounter (HOSPITAL_COMMUNITY): Payer: Commercial Managed Care - HMO

## 2014-09-28 ENCOUNTER — Other Ambulatory Visit (HOSPITAL_COMMUNITY): Payer: Self-pay | Admitting: Cardiology

## 2014-09-28 DIAGNOSIS — I5022 Chronic systolic (congestive) heart failure: Secondary | ICD-10-CM

## 2014-10-04 IMAGING — CT CT ABD-PELV W/ CM
2 of 5 series · 16 of 46 positions shown, 18 images · IV contrast (Omnipaque 300)
Comparison: Ultrasound the abdomen on 11/01/2005

CLINICAL DATA: Epigastric pain, nausea for 4 months. 20 lb weight
loss. Bloody stools. History of hemorrhoids. History of
hypertension, diabetes, CAD, CHF.

EXAM:
CT ABDOMEN AND PELVIS WITH CONTRAST
TECHNIQUE: Multidetector CT imaging of the abdomen and pelvis was performed
using the standard protocol following bolus administration of
intravenous contrast.
CONTRAST:  100mL OMNIPAQUE IOHEXOL 300 MG/ML  SOLN

[Series 2: abd_pel_with 5.0 b40f · axial · 0.74mm/px · z∈[-762,-372]mm · 13 of 88 slices shown, 15 images]
[im 5/88  soft-tissue]
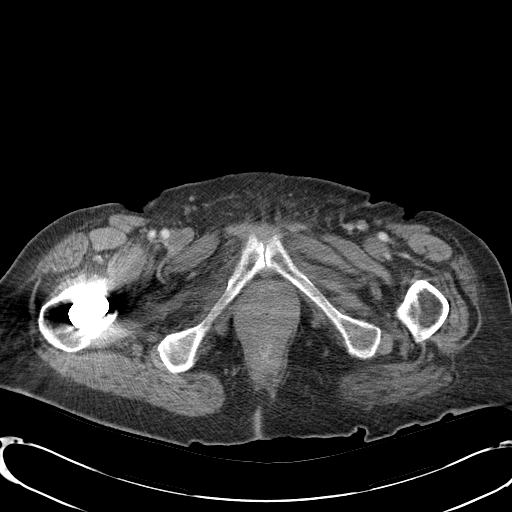
[im 5/88  bone]
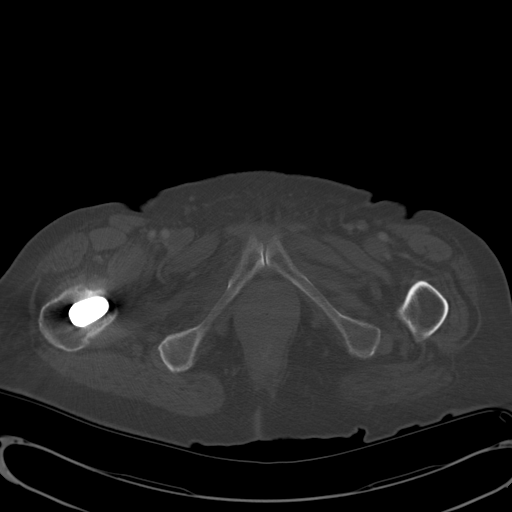
[im 10/88  soft-tissue]
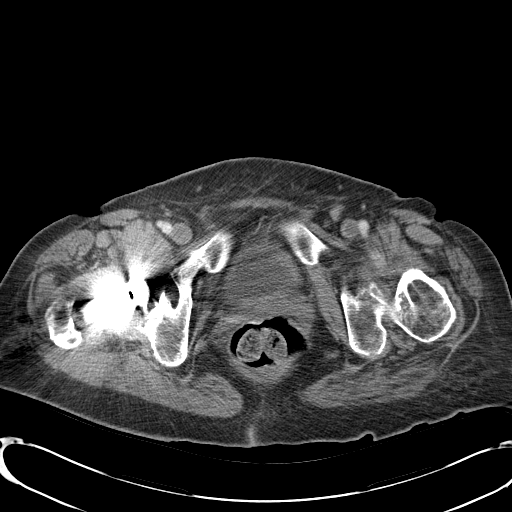
[im 25/88  soft-tissue]
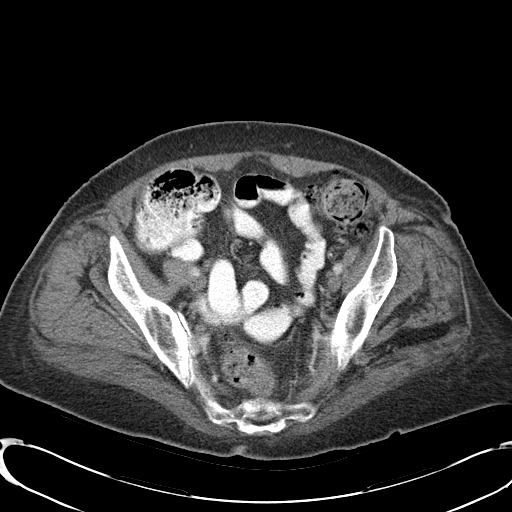
[im 30/88  soft-tissue]
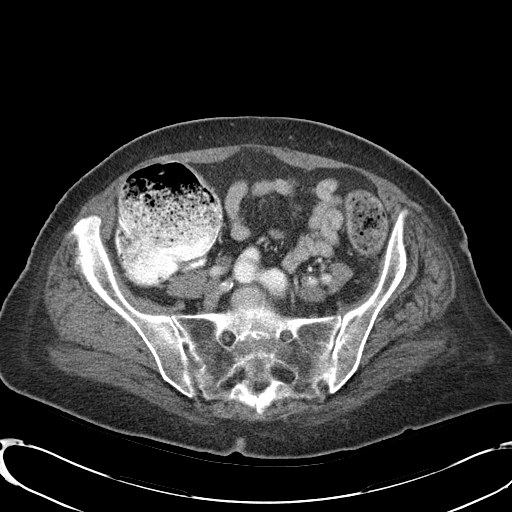
[im 34/88  soft-tissue]
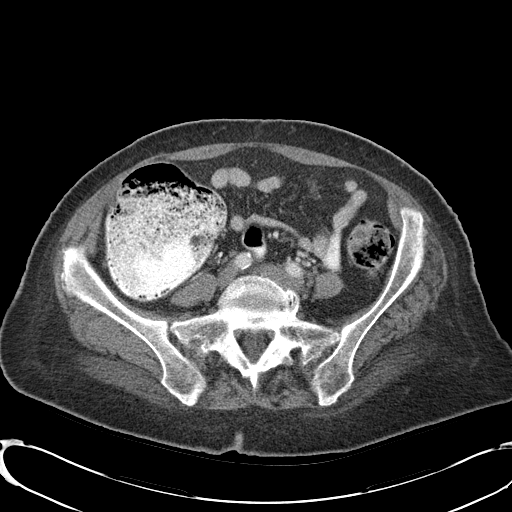
[im 39/88  soft-tissue]
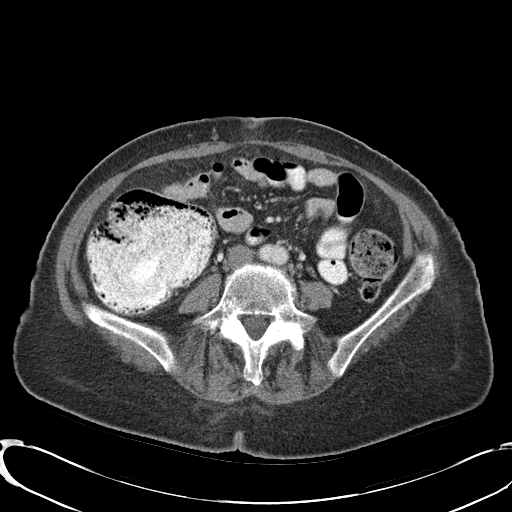
[im 49/88  soft-tissue]
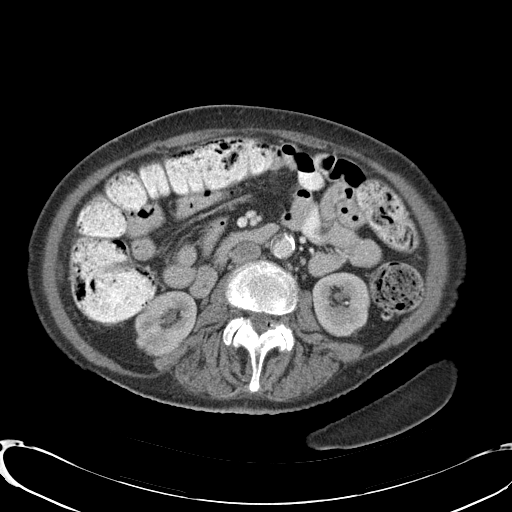
[im 54/88  soft-tissue]
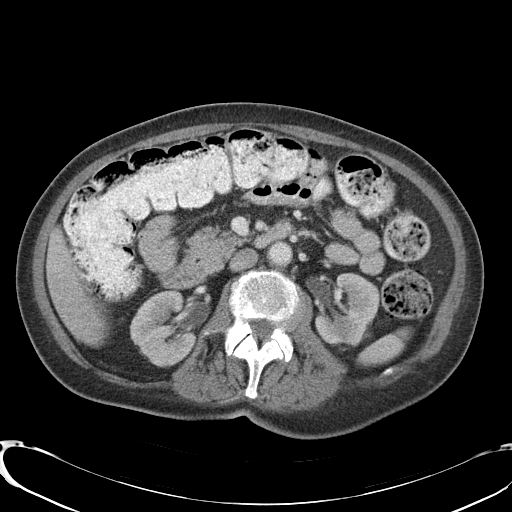
[im 59/88  soft-tissue]
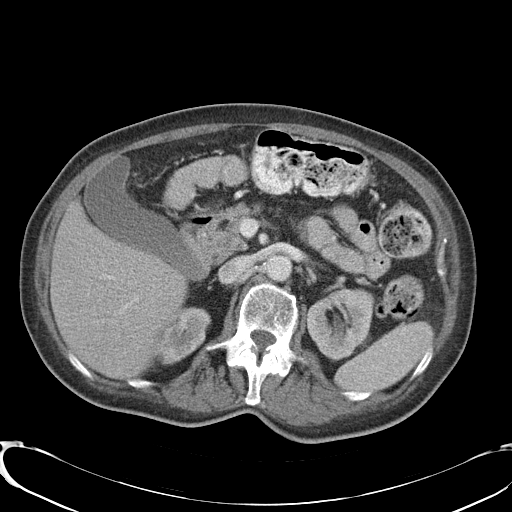
[im 59/88  bone]
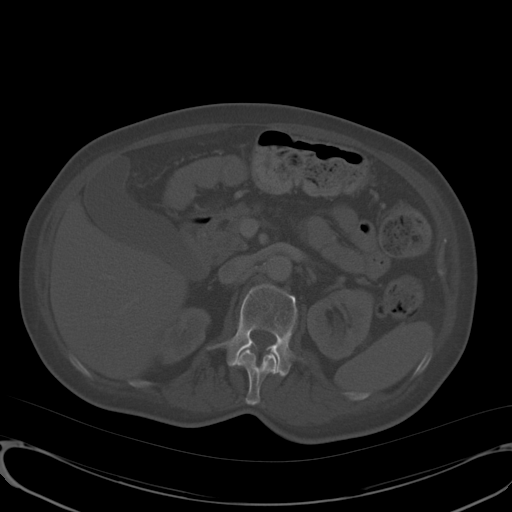
[im 63/88  soft-tissue]
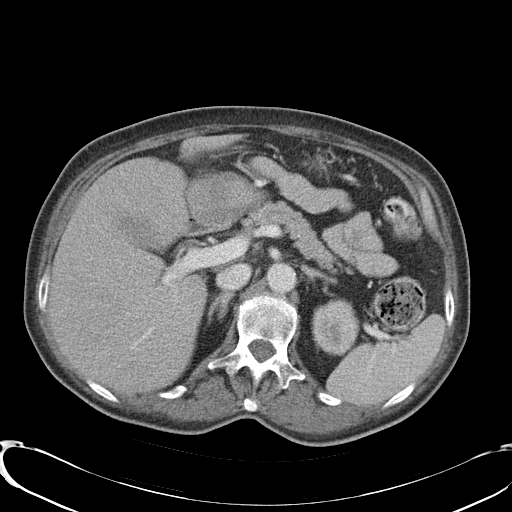
[im 68/88  soft-tissue]
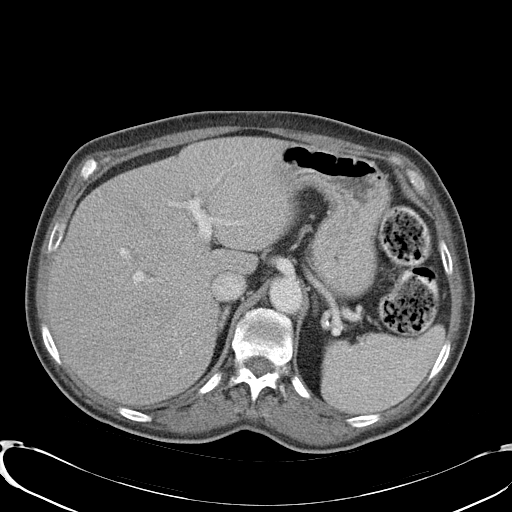
[im 78/88  soft-tissue]
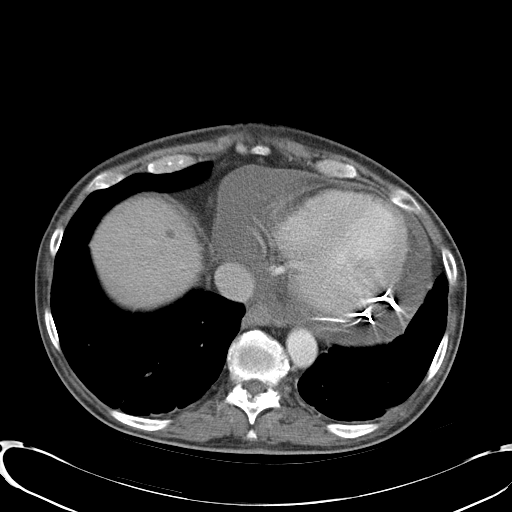
[im 83/88  soft-tissue]
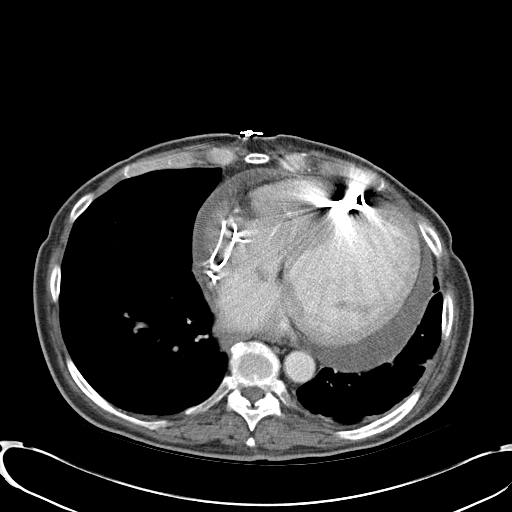

[Series 4: abd_pel_with 3.0 spo cor · coronal · 0.67mm/px · 3 of 77 slices shown]
[im 26/77  soft-tissue]
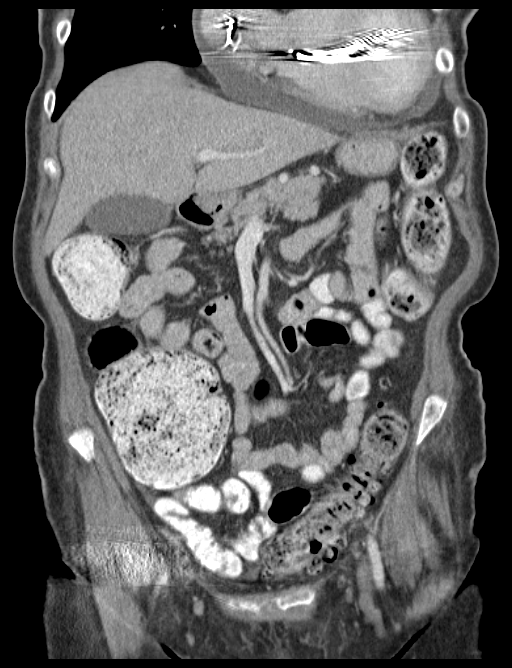
[im 34/77  soft-tissue]
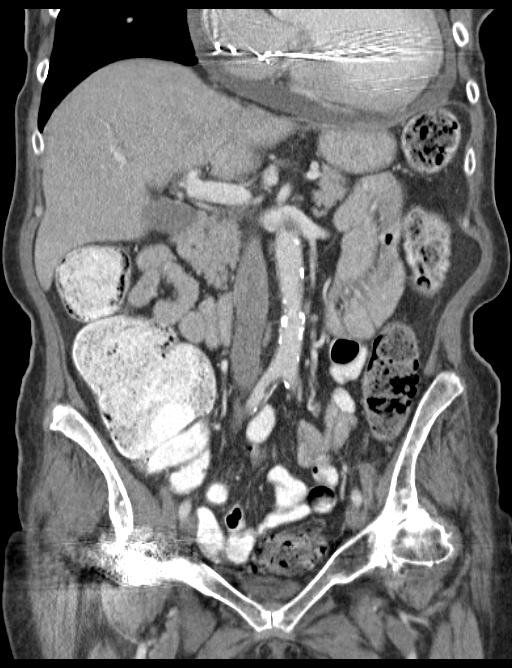
[im 43/77  soft-tissue]
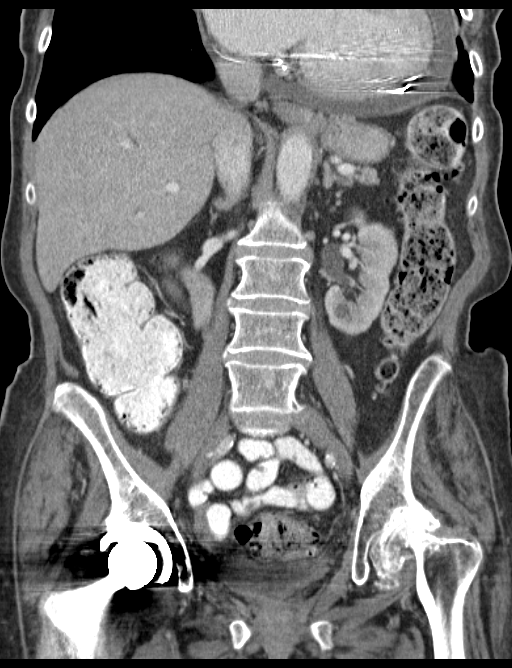

[16 of 46 positions shown; findings below may reference images not displayed]

FINDINGS: The heart is enlarged. Patient has coronary sinus pacemaker lead.
Large pericardial effusion is noted, measuring 2.6 cm in depth. Lung
bases show scarring but are otherwise clear.

At the dome of the right hepatic lobe, there is a tiny
low-attenuation lesion, not fully characterized but statistically
likely to represent a cyst. This measures 6 mm in diameter. No other
liver lesions are identified. No focal abnormality identified within
the pancreas. The left adrenal gland has a normal appearance. Right
adrenal low-attenuation lesion measures 1.4 cm in diameter. From
postcontrast images to delayed images, this lesion shows 60%
washout, consistent with a benign process. Small low-attenuation
lesions are identified within the kidneys, likely representing cyst.
The gallbladder is present and is mildly distended. Splenic
granulomata are present.

The stomach and small bowel loops are normal in appearance. The
appendix is not well-seen and may be surgically absent. There is a
small amount of fluid around the cecum which is distended. Numerous
colonic diverticula are present. However, no definite inflammatory
change is identified to suggest presence of acute diverticulitis.

The uterus is surgically absent. No adnexal mass identified. No
retroperitoneal or mesenteric adenopathy. Mild degenerative changes
are seen in the spine. L4 hemangioma is incidentally noted. Previous
right hip arthroplasty.
IMPRESSION: 1. Cardiomegaly and moderate to large pericardial effusion.
2. Small, lesion in the right hepatic lobe, likely benign.
3. Benign right adrenal nodule.
4. Diverticulosis without evidence for acute diverticulitis.

## 2014-10-10 ENCOUNTER — Ambulatory Visit (INDEPENDENT_AMBULATORY_CARE_PROVIDER_SITE_OTHER): Payer: Commercial Managed Care - HMO | Admitting: Internal Medicine

## 2014-10-17 ENCOUNTER — Inpatient Hospital Stay (HOSPITAL_COMMUNITY)
Admission: EM | Admit: 2014-10-17 | Discharge: 2014-10-26 | DRG: 812 | Disposition: A | Discharge status: Home Health | Payer: Commercial Managed Care - HMO | Attending: Internal Medicine | Admitting: Internal Medicine

## 2014-10-17 ENCOUNTER — Other Ambulatory Visit: Payer: Self-pay

## 2014-10-17 ENCOUNTER — Emergency Department (HOSPITAL_COMMUNITY): Payer: Commercial Managed Care - HMO

## 2014-10-17 ENCOUNTER — Encounter (HOSPITAL_COMMUNITY): Payer: Self-pay | Admitting: *Deleted

## 2014-10-17 ENCOUNTER — Ambulatory Visit (HOSPITAL_BASED_OUTPATIENT_CLINIC_OR_DEPARTMENT_OTHER)
Admission: RE | Admit: 2014-10-17 | Discharge: 2014-10-17 | Disposition: A | Discharge status: Home / Self Care | Payer: Commercial Managed Care - HMO | Source: Ambulatory Visit | Attending: Emergency Medicine | Admitting: Emergency Medicine

## 2014-10-17 VITALS — BP 80/48 | HR 87 | Wt 153.8 lb

## 2014-10-17 DIAGNOSIS — I428 Other cardiomyopathies: Secondary | ICD-10-CM | POA: Diagnosis not present

## 2014-10-17 DIAGNOSIS — I5022 Chronic systolic (congestive) heart failure: Secondary | ICD-10-CM | POA: Diagnosis present

## 2014-10-17 DIAGNOSIS — D62 Acute posthemorrhagic anemia: Secondary | ICD-10-CM

## 2014-10-17 DIAGNOSIS — K5900 Constipation, unspecified: Secondary | ICD-10-CM | POA: Diagnosis not present

## 2014-10-17 DIAGNOSIS — K219 Gastro-esophageal reflux disease without esophagitis: Secondary | ICD-10-CM

## 2014-10-17 DIAGNOSIS — E785 Hyperlipidemia, unspecified: Secondary | ICD-10-CM | POA: Diagnosis present

## 2014-10-17 DIAGNOSIS — K5730 Diverticulosis of large intestine without perforation or abscess without bleeding: Secondary | ICD-10-CM | POA: Diagnosis present

## 2014-10-17 DIAGNOSIS — Z79899 Other long term (current) drug therapy: Secondary | ICD-10-CM

## 2014-10-17 DIAGNOSIS — I509 Heart failure, unspecified: Secondary | ICD-10-CM | POA: Diagnosis not present

## 2014-10-17 DIAGNOSIS — G4733 Obstructive sleep apnea (adult) (pediatric): Secondary | ICD-10-CM

## 2014-10-17 DIAGNOSIS — N189 Chronic kidney disease, unspecified: Secondary | ICD-10-CM | POA: Insufficient documentation

## 2014-10-17 DIAGNOSIS — I129 Hypertensive chronic kidney disease with stage 1 through stage 4 chronic kidney disease, or unspecified chronic kidney disease: Secondary | ICD-10-CM

## 2014-10-17 DIAGNOSIS — N183 Chronic kidney disease, stage 3 unspecified: Secondary | ICD-10-CM

## 2014-10-17 DIAGNOSIS — D649 Anemia, unspecified: Secondary | ICD-10-CM | POA: Insufficient documentation

## 2014-10-17 DIAGNOSIS — Z87891 Personal history of nicotine dependence: Secondary | ICD-10-CM | POA: Diagnosis not present

## 2014-10-17 DIAGNOSIS — I420 Dilated cardiomyopathy: Secondary | ICD-10-CM | POA: Diagnosis present

## 2014-10-17 DIAGNOSIS — Z7901 Long term (current) use of anticoagulants: Secondary | ICD-10-CM

## 2014-10-17 DIAGNOSIS — I447 Left bundle-branch block, unspecified: Secondary | ICD-10-CM | POA: Diagnosis present

## 2014-10-17 DIAGNOSIS — I4891 Unspecified atrial fibrillation: Secondary | ICD-10-CM

## 2014-10-17 DIAGNOSIS — T462X5A Adverse effect of other antidysrhythmic drugs, initial encounter: Secondary | ICD-10-CM | POA: Diagnosis present

## 2014-10-17 DIAGNOSIS — I480 Paroxysmal atrial fibrillation: Secondary | ICD-10-CM | POA: Diagnosis not present

## 2014-10-17 DIAGNOSIS — E861 Hypovolemia: Secondary | ICD-10-CM | POA: Diagnosis present

## 2014-10-17 DIAGNOSIS — I9589 Other hypotension: Secondary | ICD-10-CM | POA: Diagnosis not present

## 2014-10-17 DIAGNOSIS — I951 Orthostatic hypotension: Secondary | ICD-10-CM | POA: Diagnosis not present

## 2014-10-17 DIAGNOSIS — D509 Iron deficiency anemia, unspecified: Principal | ICD-10-CM | POA: Diagnosis present

## 2014-10-17 DIAGNOSIS — E039 Hypothyroidism, unspecified: Secondary | ICD-10-CM | POA: Diagnosis present

## 2014-10-17 DIAGNOSIS — I255 Ischemic cardiomyopathy: Secondary | ICD-10-CM | POA: Diagnosis not present

## 2014-10-17 DIAGNOSIS — I43 Cardiomyopathy in diseases classified elsewhere: Secondary | ICD-10-CM

## 2014-10-17 DIAGNOSIS — D469 Myelodysplastic syndrome, unspecified: Secondary | ICD-10-CM | POA: Diagnosis present

## 2014-10-17 DIAGNOSIS — N179 Acute kidney failure, unspecified: Secondary | ICD-10-CM | POA: Diagnosis present

## 2014-10-17 DIAGNOSIS — Z96641 Presence of right artificial hip joint: Secondary | ICD-10-CM | POA: Diagnosis present

## 2014-10-17 DIAGNOSIS — E119 Type 2 diabetes mellitus without complications: Secondary | ICD-10-CM | POA: Diagnosis present

## 2014-10-17 DIAGNOSIS — I959 Hypotension, unspecified: Secondary | ICD-10-CM | POA: Diagnosis present

## 2014-10-17 DIAGNOSIS — R739 Hyperglycemia, unspecified: Secondary | ICD-10-CM

## 2014-10-17 DIAGNOSIS — Z95810 Presence of automatic (implantable) cardiac defibrillator: Secondary | ICD-10-CM

## 2014-10-17 DIAGNOSIS — I429 Cardiomyopathy, unspecified: Secondary | ICD-10-CM | POA: Insufficient documentation

## 2014-10-17 DIAGNOSIS — R195 Other fecal abnormalities: Secondary | ICD-10-CM

## 2014-10-17 DIAGNOSIS — Z86711 Personal history of pulmonary embolism: Secondary | ICD-10-CM

## 2014-10-17 DIAGNOSIS — IMO0002 Reserved for concepts with insufficient information to code with codable children: Secondary | ICD-10-CM

## 2014-10-17 DIAGNOSIS — K921 Melena: Secondary | ICD-10-CM | POA: Diagnosis not present

## 2014-10-17 DIAGNOSIS — I10 Essential (primary) hypertension: Secondary | ICD-10-CM

## 2014-10-17 DIAGNOSIS — E1165 Type 2 diabetes mellitus with hyperglycemia: Secondary | ICD-10-CM

## 2014-10-17 DIAGNOSIS — E1122 Type 2 diabetes mellitus with diabetic chronic kidney disease: Secondary | ICD-10-CM | POA: Diagnosis not present

## 2014-10-17 DIAGNOSIS — I4819 Other persistent atrial fibrillation: Secondary | ICD-10-CM

## 2014-10-17 DIAGNOSIS — Z794 Long term (current) use of insulin: Secondary | ICD-10-CM | POA: Diagnosis not present

## 2014-10-17 DIAGNOSIS — E1129 Type 2 diabetes mellitus with other diabetic kidney complication: Secondary | ICD-10-CM

## 2014-10-17 LAB — COMPREHENSIVE METABOLIC PANEL
ALT: 11 U/L (ref 0–35)
ALT: 13 U/L (ref 0–35)
AST: 14 U/L (ref 0–37)
AST: 19 U/L (ref 0–37)
Albumin: 3.7 g/dL (ref 3.5–5.2)
Albumin: 3.9 g/dL (ref 3.5–5.2)
Alkaline Phosphatase: 71 U/L (ref 39–117)
Alkaline Phosphatase: 73 U/L (ref 39–117)
Anion gap: 10 (ref 5–15)
Anion gap: 11 (ref 5–15)
BILIRUBIN TOTAL: 0.8 mg/dL (ref 0.3–1.2)
BUN: 58 mg/dL — ABNORMAL HIGH (ref 6–23)
BUN: 58 mg/dL — ABNORMAL HIGH (ref 6–23)
CALCIUM: 9.2 mg/dL (ref 8.4–10.5)
CALCIUM: 9.3 mg/dL (ref 8.4–10.5)
CHLORIDE: 93 mmol/L — AB (ref 96–112)
CO2: 28 mmol/L (ref 19–32)
CO2: 28 mmol/L (ref 19–32)
CREATININE: 2.48 mg/dL — AB (ref 0.50–1.10)
Chloride: 93 mmol/L — ABNORMAL LOW (ref 96–112)
Creatinine, Ser: 2.5 mg/dL — ABNORMAL HIGH (ref 0.50–1.10)
GFR calc Af Amer: 22 mL/min — ABNORMAL LOW (ref >90)
GFR calc non Af Amer: 19 mL/min — ABNORMAL LOW (ref >90)
GFR, EST AFRICAN AMERICAN: 21 mL/min — AB (ref >90)
GFR, EST NON AFRICAN AMERICAN: 18 mL/min — AB (ref >90)
GLUCOSE: 545 mg/dL — AB (ref 70–99)
GLUCOSE: 571 mg/dL — AB (ref 70–99)
Potassium: 4.5 mmol/L (ref 3.5–5.1)
Potassium: 4.6 mmol/L (ref 3.5–5.1)
SODIUM: 131 mmol/L — AB (ref 135–145)
Sodium: 132 mmol/L — ABNORMAL LOW (ref 135–145)
TOTAL PROTEIN: 6.8 g/dL (ref 6.0–8.3)
TOTAL PROTEIN: 7 g/dL (ref 6.0–8.3)
Total Bilirubin: 0.5 mg/dL (ref 0.3–1.2)

## 2014-10-17 LAB — CBC WITH DIFFERENTIAL/PLATELET
BASOS ABS: 0 10*3/uL (ref 0.0–0.1)
Basophils Relative: 0 % (ref 0–1)
Eosinophils Absolute: 0.1 10*3/uL (ref 0.0–0.7)
Eosinophils Relative: 1 % (ref 0–5)
HCT: 19.8 % — ABNORMAL LOW (ref 36.0–46.0)
HEMOGLOBIN: 6.7 g/dL — AB (ref 12.0–15.0)
LYMPHS PCT: 11 % — AB (ref 12–46)
Lymphs Abs: 1 10*3/uL (ref 0.7–4.0)
MCH: 30.5 pg (ref 26.0–34.0)
MCHC: 33.8 g/dL (ref 30.0–36.0)
MCV: 90 fL (ref 78.0–100.0)
MONO ABS: 0.4 10*3/uL (ref 0.1–1.0)
Monocytes Relative: 4 % (ref 3–12)
Neutro Abs: 7.5 10*3/uL (ref 1.7–7.7)
Neutrophils Relative %: 84 % — ABNORMAL HIGH (ref 43–77)
Platelets: 200 10*3/uL (ref 150–400)
RBC: 2.2 MIL/uL — AB (ref 3.87–5.11)
RDW: 13.4 % (ref 11.5–15.5)
WBC: 9 10*3/uL (ref 4.0–10.5)

## 2014-10-17 LAB — CBC
HCT: 19.1 % — ABNORMAL LOW (ref 36.0–46.0)
HEMOGLOBIN: 6.4 g/dL — AB (ref 12.0–15.0)
MCH: 29.9 pg (ref 26.0–34.0)
MCHC: 33.5 g/dL (ref 30.0–36.0)
MCV: 89.3 fL (ref 78.0–100.0)
Platelets: 185 10*3/uL (ref 150–400)
RBC: 2.14 MIL/uL — ABNORMAL LOW (ref 3.87–5.11)
RDW: 13.4 % (ref 11.5–15.5)
WBC: 7.9 10*3/uL (ref 4.0–10.5)

## 2014-10-17 LAB — CBG MONITORING, ED
GLUCOSE-CAPILLARY: 489 mg/dL — AB (ref 70–99)
GLUCOSE-CAPILLARY: 460 mg/dL — AB (ref 70–99)
Glucose-Capillary: 390 mg/dL — ABNORMAL HIGH (ref 70–99)
Glucose-Capillary: 260 mg/dL — ABNORMAL HIGH (ref 70–99)
Glucose-Capillary: 182 mg/dL — ABNORMAL HIGH (ref 70–99)

## 2014-10-17 LAB — DIGOXIN LEVEL: DIGOXIN LVL: 1.1 ng/mL (ref 0.8–2.0)

## 2014-10-17 LAB — URINALYSIS, ROUTINE W REFLEX MICROSCOPIC
Bilirubin Urine: NEGATIVE
Glucose, UA: >1000 mg/dL — AB
HGB URINE DIPSTICK: NEGATIVE
Ketones, ur: NEGATIVE mg/dL
Leukocytes, UA: NEGATIVE
Nitrite: NEGATIVE
PH: 6.5 (ref 5.0–8.0)
PROTEIN: NEGATIVE mg/dL
SPECIFIC GRAVITY, URINE: 1.012 (ref 1.005–1.030)
Urobilinogen, UA: 0.2 mg/dL (ref 0.0–1.0)

## 2014-10-17 LAB — MRSA PCR SCREENING: MRSA by PCR: POSITIVE — AB

## 2014-10-17 LAB — LACTATE DEHYDROGENASE: LDH: 157 U/L (ref 94–250)

## 2014-10-17 LAB — URINE MICROSCOPIC-ADD ON

## 2014-10-17 LAB — TROPONIN I: TROPONIN I: 0.03 ng/mL (ref <0.031)

## 2014-10-17 LAB — BRAIN NATRIURETIC PEPTIDE: B Natriuretic Peptide: 488.1 pg/mL — ABNORMAL HIGH (ref 0.0–100.0)

## 2014-10-17 LAB — GLUCOSE, CAPILLARY
GLUCOSE-CAPILLARY: 116 mg/dL — AB (ref 70–99)
Glucose-Capillary: 311 mg/dL — ABNORMAL HIGH (ref 70–99)

## 2014-10-17 LAB — T4, FREE: FREE T4: 0.83 ng/dL (ref 0.80–1.80)

## 2014-10-17 LAB — PREPARE RBC (CROSSMATCH)

## 2014-10-17 LAB — TSH: TSH: 19.74 u[IU]/mL — ABNORMAL HIGH (ref 0.350–4.500)

## 2014-10-17 MED ORDER — INSULIN ASPART 100 UNIT/ML ~~LOC~~ SOLN
0.0000 [IU] | Freq: Three times a day (TID) | SUBCUTANEOUS | Status: DC
  Administered 2014-10-18: 7 [IU] via SUBCUTANEOUS
  Administered 2014-10-18 (×2): 9 [IU] via SUBCUTANEOUS
  Administered 2014-10-19: 7 [IU] via SUBCUTANEOUS

## 2014-10-17 MED ORDER — MUPIROCIN 2 % EX OINT
1.0000 "application " | TOPICAL_OINTMENT | Freq: Two times a day (BID) | CUTANEOUS | Status: AC
  Administered 2014-10-17 – 2014-10-22 (×10): 1 via NASAL
  Filled 2014-10-17 (×3): qty 22

## 2014-10-17 MED ORDER — ONDANSETRON HCL 4 MG PO TABS: 4.0000 mg | ORAL_TABLET | Freq: Four times a day (QID) | ORAL | Status: DC | PRN

## 2014-10-17 MED ORDER — PANTOPRAZOLE SODIUM 40 MG IV SOLR
40.0000 mg | INTRAVENOUS | Status: DC
  Administered 2014-10-18 – 2014-10-20 (×4): 40 mg via INTRAVENOUS
  Filled 2014-10-17 (×6): qty 40

## 2014-10-17 MED ORDER — DIGOXIN 0.0625 MG HALF TABLET
0.0625 mg | ORAL_TABLET | Freq: Every day | ORAL | Status: DC
  Filled 2014-10-17: qty 1

## 2014-10-17 MED ORDER — ACETAMINOPHEN 650 MG RE SUPP: 650.0000 mg | Freq: Four times a day (QID) | RECTAL | Status: DC | PRN

## 2014-10-17 MED ORDER — SODIUM CHLORIDE 0.9 % IV SOLN
INTRAVENOUS | Status: AC
Start: 2014-10-17 — End: 2014-10-18

## 2014-10-17 MED ORDER — AMIODARONE HCL 200 MG PO TABS
200.0000 mg | ORAL_TABLET | Freq: Every day | ORAL | Status: DC
  Administered 2014-10-18 – 2014-10-26 (×9): 200 mg via ORAL
  Filled 2014-10-17 (×9): qty 1

## 2014-10-17 MED ORDER — INSULIN ASPART 100 UNIT/ML ~~LOC~~ SOLN
0.0000 [IU] | Freq: Every day | SUBCUTANEOUS | Status: DC
  Administered 2014-10-17: 4 [IU] via SUBCUTANEOUS
  Administered 2014-10-18: 5 [IU] via SUBCUTANEOUS
  Administered 2014-10-19: 3 [IU] via SUBCUTANEOUS

## 2014-10-17 MED ORDER — SODIUM CHLORIDE 0.9 % IV SOLN
INTRAVENOUS | Status: DC
  Administered 2014-10-17: 4.3 [IU]/h via INTRAVENOUS
  Filled 2014-10-17: qty 2.5

## 2014-10-17 MED ORDER — ONDANSETRON HCL 4 MG/2ML IJ SOLN: 4.0000 mg | Freq: Four times a day (QID) | INTRAMUSCULAR | Status: DC | PRN

## 2014-10-17 MED ORDER — PRAVASTATIN SODIUM 40 MG PO TABS
80.0000 mg | ORAL_TABLET | Freq: Every day | ORAL | Status: DC
  Administered 2014-10-17 – 2014-10-25 (×9): 80 mg via ORAL
  Filled 2014-10-17 (×4): qty 2
  Filled 2014-10-17 (×3): qty 1
  Filled 2014-10-17 (×3): qty 2

## 2014-10-17 MED ORDER — LEVOTHYROXINE SODIUM 25 MCG PO TABS
25.0000 ug | ORAL_TABLET | Freq: Every day | ORAL | Status: DC
  Administered 2014-10-18 – 2014-10-26 (×9): 25 ug via ORAL
  Filled 2014-10-17 (×10): qty 1

## 2014-10-17 MED ORDER — ACETAMINOPHEN 325 MG PO TABS
650.0000 mg | ORAL_TABLET | Freq: Four times a day (QID) | ORAL | Status: DC | PRN
  Filled 2014-10-17: qty 2

## 2014-10-17 MED ORDER — INSULIN ASPART 100 UNIT/ML ~~LOC~~ SOLN: 0.0000 [IU] | Freq: Three times a day (TID) | SUBCUTANEOUS | Status: DC

## 2014-10-17 MED ORDER — CHLORHEXIDINE GLUCONATE CLOTH 2 % EX PADS
6.0000 | MEDICATED_PAD | Freq: Every day | CUTANEOUS | Status: AC
  Administered 2014-10-18 – 2014-10-22 (×5): 6 via TOPICAL

## 2014-10-17 MED ORDER — SODIUM CHLORIDE 0.9 % IV SOLN
10.0000 mL/h | Freq: Once | INTRAVENOUS | Status: AC
  Administered 2014-10-17: 10 mL/h via INTRAVENOUS

## 2014-10-17 NOTE — Progress Notes (Signed)
     Seen by Dr. Aundra Dubin today in CHF clinic and sent to ED. ED has consulted [facility]. Medicine admitting patient tonight for hypotension and symptomatic anemia. CHF team will round on her tomorrow AM.   Angelena Form PA-C  [facility]

## 2014-10-17 NOTE — ED Notes (Addendum)
Patient is resting comfortably with family at bedside. 

## 2014-10-17 NOTE — H&P (Signed)
Date: 10/17/2014               Patient Name:  [name] MRN: [id number]  DOB: April 13, 1946 Age / Sex: 69 y.o., female   PCP: Lemmie Evens, MD         Medical Service: Internal Medicine Teaching Service         Attending Physician: Dr. Madilyn Fireman, MD    First Contact: Dr. Raelene Bott Pager: [phone]  Second Contact: Dr. Gordy Levan Pager: [phone]       After Hours (After 5p/  First Contact Pager: [phone]  weekends / holidays): Second Contact Pager: [phone]   Chief Complaint: Hypotension   History of Present Illness:   Patient is a 69 year old with a history of symptomatic anemia, chronic kidney disease, type 2 diabetes, nonischemic cardiomyopathy status post ICD placement, paroxysmal atrial fibrillation who presents with symptomatic anemia and hypotension. Patient presented from her cardiologist's office for a regular checkup when she was noted to have a blood pressure and there was concern from Dr. Aundra Dubin that patient was experiencing symptomatic anemia. Patient states that she has been feeling malaise and dizziness over the last several days. She states that she simply does not have much energy to do most of her daily activities. She denies any episodes of syncope but does report having some sensations of presyncope. She states that episodes like this have happened in the past before. She has received blood transfusions which have helped with her symptoms in the past, and she has had multiple transfusions in the past at a frequency of about every couple months. She denies any hematochezia or melena. She denies any hematuria or passing of blood vaginally. She denies any other sources of bleeding. She denies any history of thyroid disease or being on any thyroid medications. Patient denies any family history of hematologic disorders.  Otherwise, patient denies any fevers, chills, vomiting, chest pain, dyspnea, diarrhea, constipation, dysuria, or hematuria.  Meds: Medications Prior to Admission   Medication Sig Dispense Refill  . amiodarone (PACERONE) 200 MG tablet Take 1 tablet (200 mg total) by mouth daily. 90 tablet 3  . carvedilol (COREG) 25 MG tablet Take 12.5 mg by mouth 2 (two) times daily with a meal.    . cetirizine (ZYRTEC) 10 MG tablet Take 10 mg by mouth daily.    . colchicine 0.6 MG tablet Take 0.6 mg by mouth 2 (two) times daily.     . digoxin (LANOXIN) 0.125 MG tablet Take 0.5 tablets (0.0625 mg total) by mouth daily. 30 tablet 3  . furosemide (LASIX) 40 MG tablet Take 1.5 tablets (60 mg total) by mouth 2 (two) times daily. 90 tablet 3  . lisinopril (PRINIVIL,ZESTRIL) 2.5 MG tablet Take 2.5 mg by mouth daily.    . metoCLOPramide (REGLAN) 5 MG tablet Take 5 mg by mouth 4 (four) times daily.    . ondansetron (ZOFRAN) 4 MG tablet Take 4 mg by mouth every 4 (four) hours as needed for nausea.     Marland Kitchen oxyCODONE-acetaminophen (PERCOCET) 10-325 MG per tablet Take 1 tablet by mouth every 6 (six) hours as needed for pain.     . pravastatin (PRAVACHOL) 40 MG tablet Take 80 mg by mouth at bedtime.     Marland Kitchen warfarin (COUMADIN) 5 MG tablet Take 5 mg by mouth one time only at 6 PM.     . ivabradine (CORLANOR) 5 MG TABS tablet Take 0.5 tablets (2.5 mg total) by mouth 2 (two) times daily with a meal. (Patient  not taking: Reported on 10/17/2014) 60 tablet 3   Current Facility-Administered Medications  Medication Dose Route Frequency Provider Last Rate Last Dose  . acetaminophen (TYLENOL) tablet 650 mg  650 mg Oral Q6H PRN Jones Bales, MD       Or  . acetaminophen (TYLENOL) suppository 650 mg  650 mg Rectal Q6H PRN Jones Bales, MD      . Derrill Memo ON 10/18/2014] amiodarone (PACERONE) tablet 200 mg  200 mg Oral Daily Jones Bales, MD      . Derrill Memo ON 10/18/2014] digoxin (LANOXIN) tablet 0.0625 mg  0.0625 mg Oral Daily Jones Bales, MD      . Derrill Memo ON 10/18/2014] insulin aspart (novoLOG) injection 0-9 Units  0-9 Units Subcutaneous TID WC Luan Moore, MD      . Derrill Memo ON 10/18/2014]  levothyroxine (SYNTHROID, LEVOTHROID) tablet 25 mcg  25 mcg Oral QAC breakfast Jones Bales, MD      . ondansetron (ZOFRAN) tablet 4 mg  4 mg Oral Q6H PRN Jones Bales, MD       Or  . ondansetron (ZOFRAN) injection 4 mg  4 mg Intravenous Q6H PRN Jones Bales, MD      . pantoprazole (PROTONIX) injection 40 mg  40 mg Intravenous Q24H Jones Bales, MD      . pravastatin (PRAVACHOL) tablet 80 mg  80 mg Oral QHS Jones Bales, MD        Past Medical History  Diagnosis Date  . Cardiomyopathy, nonischemic     a. 1999 nl cath;  b. 12/05 [employer] [place];  c. 10/2005 ICD extraction 2/2 enterococcus bacteremia and Veg on RV lead;  c. 05/2008 low risk Myoview (scarring w/ some evidence of inf ischemia);  d. 11/2012 Echo: EF 15-20%;  e. 01/2013 s/p MDT [place] CRT D, ser # [id number];  f. 05/2013 Echo: EF 15%.  . Enterococcal infection     a. 10/2005 - AICD-explanted  . Pulmonary embolism     a. 07/2005 after total right hip arthroplasty  . Hilar density     a. infrahilar mass/adenopathy on CT scan 5/07; subsequently  resolved  . LBBB (left bundle branch block)   . GERD (gastroesophageal reflux disease)   . Hyperlipidemia   . Hypertension   . Tobacco abuse     a. discontinued in 1997, and then resumed  . Urinary incontinence   . Anemia     a. mild/chronic  . Villous adenoma of colon     a. tubovillous adenomatous polyp with focal high grade dysplasia; presented with hematochezia - followed by Dr. Laural Golden.  . CKD (chronic kidney disease), stage III     creatinin-1.44 in 1/09; 1.51 in 1/10  . Implantable cardioverter-defibrillator-CRT- Mdt     a.  01/2013 s/p MDT [place] CRT D, ser # [id number]  . Chronic systolic CHF (congestive heart failure)     a. 11/2012 Echo: EF 15-20%;  b. 05/2013 TEE EF 15%.  . Pneumonia 07/2005  . Obstructive sleep apnea     a. mild-did not tolerate CPAP (01/24/2013)  . Type II diabetes mellitus   . History of blood transfusion   . Degenerative joint disease      of knees, shoulder, and hips  . PAF (paroxysmal atrial fibrillation)     a. 07/2012 s/p TEE/DCCV;  b. chronic coumadin;  c. 05/2013 Recurrent Afib->TEE/DCCV and amio initiation.    Past Surgical History  Procedure Laterality Date  . Pacemaker removal  11/18/05    Enterococcal infection  . Total hip arthroplasty Right 07/2005  . Knee arthroscopy Right 1980's?  . Colonoscopy w/ polypectomy  2009  . Cardioversion N/A 08/20/2012    Procedure: TEE GUIDED CARDIOVERSION;  Surgeon: Yehuda Savannah, MD;  Location: [facility];  Service: Cardiovascular;  Laterality: N/A;  To be done @ bedside  . Tee without cardioversion N/A 08/20/2012    Procedure: TRANSESOPHAGEAL ECHOCARDIOGRAM (TEE);  Surgeon: Yehuda Savannah, MD;  Location: [facility];  Service: Cardiovascular;  Laterality: N/A;  . Bi-ventricular implantable cardioverter defibrillator  (crt-d)  01/24/2013  . A-v cardiac pacemaker insertion  12/05    Biventricular pacemaker/AICD  . Abdominal hysterectomy  1990/92    Initial partial hysterectomy followed by BSO  . Tubal ligation  1980's  . Cardiac catheterization    . Colonoscopy with esophagogastroduodenoscopy (egd) N/A 06/10/2013    Procedure: COLONOSCOPY WITH ESOPHAGOGASTRODUODENOSCOPY (EGD);  Surgeon: Rogene Houston, MD;  Location: [facility];  Service: Endoscopy;  Laterality: N/A;  925  . Tee without cardioversion N/A 06/20/2013    Procedure: TRANSESOPHAGEAL ECHOCARDIOGRAM (TEE);  Surgeon: Dorothy Spark, MD;  Location: [facility];  Service: Cardiovascular;  Laterality: N/A;  . Cardioversion N/A 06/20/2013    Procedure: CARDIOVERSION;  Surgeon: Dorothy Spark, MD;  Location: [facility] ENDOSCOPY;  Service: Cardiovascular;  Laterality: N/A;  . Bi-ventricular implantable cardioverter defibrillator N/A 01/24/2013    Procedure: BI-VENTRICULAR IMPLANTABLE CARDIOVERTER DEFIBRILLATOR  (CRT-D);  Surgeon: Evans Lance, MD;  Location: [facility] CATH LAB;  Service: Cardiovascular;  Laterality: N/A;  . Right  heart catheterization N/A 04/18/2014    Procedure: RIGHT HEART CATH;  Surgeon: Larey Dresser, MD;  Location: [facility] CATH LAB;  Service: Cardiovascular;  Laterality: N/A;     Allergies: Allergies as of 10/17/2014  . (No Known Allergies)    Family History  Problem Relation Age of Onset  . Hypertension Mother   . Diabetes Mother   . Coronary artery disease Father   . Diabetes Brother   . Hypertension Brother   . Lung cancer Brother   . Arthritis Other   . Diabetes Other   . Heart disease Other     female < 59    History   Social History  . Marital Status: Married    Spouse Name: N/A  . Number of Children: N/A  . Years of Education: N/A   Occupational History  . Not on file.   Social History Main Topics  . Smoking status: Former Smoker -- 12 years    Types: Cigarettes  . Smokeless tobacco: Never Used     Comment: 05/2013: Smokes an occasional cigarette.  Says that she doesn't inhale.  . Alcohol Use: No  . Drug Use: No  . Sexual Activity: Not Currently    Birth Control/ Protection: None   Other Topics Concern  . Not on file   Social History Narrative   Married, lives in [place] with spouse. Retired Secretary/administrator.      Review of Systems: All pertinent ROS as stated in HPI.   Physical Exam: Blood pressure 117/76, pulse 72, temperature 98 F (36.7 C), temperature source Oral, resp. rate 16, height 5\' 5"  (1.651 m), weight 157 lb 3 oz (71.3 kg), SpO2 100 %. General: resting in bed HEENT: PERRL, EOMI, no scleral icterus, conjunctival pallor Cardiac: RRR, no rubs or gallops, 2/6 systolic murmur Pulm: clear to auscultation bilaterally, moving normal volumes of air Abd: soft, nontender, nondistended, BS present Ext: warm and well perfused,  no pedal edema Neuro: alert and oriented X3, cranial nerves II-XII grossly intact Skin: no rashes or lesions noted Psych: appropriate affect  Lab results: Basic Metabolic Panel:  Recent Labs  10/17/14 0915 10/17/14 1000    NA 131* 132*  K 4.5 4.6  CL 93* 93*  CO2 28 28  GLUCOSE 545* 571*  BUN 58* 58*  CREATININE 2.48* 2.50*  CALCIUM 9.2 9.3   Liver Function Tests:  Recent Labs  10/17/14 0915 10/17/14 1000  AST 14 19  ALT 11 13  ALKPHOS 71 73  BILITOT 0.5 0.8  PROT 6.8 7.0  ALBUMIN 3.7 3.9   No results for input(s): LIPASE, AMYLASE in the last 72 hours. No results for input(s): AMMONIA in the last 72 hours. CBC:  Recent Labs  10/17/14 0915 10/17/14 1000  WBC 7.9 9.0  NEUTROABS  --  7.5  HGB 6.4* 6.7*  HCT 19.1* 19.8*  MCV 89.3 90.0  PLT 185 200   Cardiac Enzymes:  Recent Labs  10/17/14 1000  TROPONINI 0.03   BNP: No results for input(s): PROBNP in the last 72 hours. D-Dimer: No results for input(s): DDIMER in the last 72 hours. CBG:  Recent Labs  10/17/14 1252 10/17/14 1400 10/17/14 1507 10/17/14 1613 10/17/14 1716  GLUCAP 489* 460* 390* 260* 182*   Hemoglobin A1C: No results for input(s): HGBA1C in the last 72 hours. Fasting Lipid Panel: No results for input(s): CHOL, HDL, LDLCALC, TRIG, CHOLHDL, LDLDIRECT in the last 72 hours. Thyroid Function Tests:  Recent Labs  10/17/14 0915  TSH 19.740*  FREET4 0.83   Anemia Panel: No results for input(s): VITAMINB12, FOLATE, FERRITIN, TIBC, IRON, RETICCTPCT in the last 72 hours. Coagulation: No results for input(s): LABPROT, INR in the last 72 hours. Urine Drug Screen: Drugs of Abuse  No results found for: LABOPIA, COCAINSCRNUR, LABBENZ, AMPHETMU, THCU, LABBARB  Alcohol Level: No results for input(s): ETH in the last 72 hours. Urinalysis:  Recent Labs  10/17/14 1050  COLORURINE YELLOW  LABSPEC 1.012  PHURINE 6.5  GLUCOSEU >1000*  HGBUR NEGATIVE  BILIRUBINUR NEGATIVE  KETONESUR NEGATIVE  PROTEINUR NEGATIVE  UROBILINOGEN 0.2  NITRITE NEGATIVE  LEUKOCYTESUR NEGATIVE   Imaging results:  Dg Chest 2 View  10/17/2014   CLINICAL DATA:  Dizziness, nausea 2 days  EXAM: CHEST  2 VIEW  COMPARISON:   01/10/2014  FINDINGS: There is no focal parenchymal opacity, pleural effusion, or pneumothorax. The heart and mediastinal contours are stable. There is a 3 lead AICD.  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  IMPRESSION: No active cardiopulmonary disease.   Electronically Signed   By: [name]   On: 10/17/2014 11:41    Other results: EKG Interpretation  Date/Time:  Tuesday October 17 2014 09:36:55 EDT Ventricular Rate:  78 PR Interval:  174 QRS Duration: 196 QT Interval:  488 QTC Calculation: 556 R Axis:   -97 Text Interpretation:  AV dual-paced rhythm Biventricular pacemaker detected Abnormal ECG Confirmed by ZACKOWSKI  MD, SCOTT ([id number]) on 10/17/2014 12:39:26 PM   Assessment & Plan by Problem: Active Problems:   Anemia   Hypotension  Patient is a 69 year old with a history of symptomatic anemia, chronic kidney disease, type 2 diabetes, nonischemic cardiomyopathy status post ICD placement, paroxysmal atrial fibrillation who is admitted for symptomatic anemia and hypotension.   Symptomatic normocytic anemia: She has required multiple transfusions in the past. Patient status post EGD and colonoscopy in December 2014 without any significant sources of bleeding. Patient has a more remote  history of colonoscopy in 2008 significant for tubulovillous adenoma versus resection. Patient was last transfused in December 2015 onset up for a follow-up with GI but did not follow-up for appointment for repeat colonoscopy. Patient's last anemia panel from January 2013 was unremarkable for iron deficiency anemia or folate deficiency. Historically, patient has consistently had a normocytic anemia and currently presents with an MCV of 89.3. There is no note of any elevated indirect hyperbilirubinemia in the patient's chart. There is no evidence of any workup for hemolysis or any [place] sources of normocytic anemia and the patient in the past. Patient does have evidence of an elevated TSH of 19.7  as discussed below that could be contributory to her current anemia. Patient has had a elevated TSH in the past but there is no evidence of follow-up testing or thyroid supplementation.  -Patient is ordered for 2 units of packed red blood cells. -Repeat anemia panel -LDH, haptoglobin  -FOBT -Thyroid supplementation as below.  Hypotension: Patient's initial pressures upon initial evaluation at 99/52. Patient has improved now at 117/76. This is likely due to decreased oral intake as the patient reports that she has not had as much of an appetite and has not been drinking as much lately. Patient has had previous admissions for the same presentation in the past. This has also been complicated by acute kidney injury as described below. IV fluid resuscitation is complicated by patient's history of severe systolic heart failure as described below. -Continue to monitor -Gently fluid resuscitated if necessary.  Acute on chronic kidney disease: Patient's creatinine up to 2.5 from a prior of 2.2 from December 2015. Review of the chart shows a steady increase in baseline creatinine since 2014. This could be secondary to prerenal etiology as patient seems hypovolemic upon exam. Suspect improvement given the concurrent improvement in the blood pressures at this point. -Continue to monitor  Type 2 diabetes: Patient's last hemoglobin is 7.4 from February 2014. Patient's admission glucose elevated at 571. Patient not currently on any diabetes medications. Patient was placed on a insulin infusion in the emergency department. Patient's urine was negative for ketones. Patient's glucose has dropped to the 100s.  -Start sliding scale insulin-sensitive  Nonischemic cardiomyopathy: Status post placement of a [employer] CRT-D device placed in August 2014. Interrogation showing no evidence of atrial fibrillation or ventricular tachycardia. Echocardiogram from 2014 with an ejection fraction of 15%. Pacemaker in place. At home,  patient is on Coreg 12.5 mg twice a day, lisinopril 2.5 mg daily. -Hold home Coreg and lisinopril at this point given low blood pressures. -Repeat echocardiogram -No signs of fluid overload status post blood transfusion  Hypothyroidism: TSH of 19.7. Free T4 within normal limits of 0.83 although patient may be symptomatic from her thyroid state which argues against subclinical hypothyroidism. At home, patient is not on any thyroid supplementation.  -Synthroid 25 g daily  Paroxysmal atrial fibrillation: Last cardioverted in December 2014. Interrogation in cardiologist clinic unremarkable for any atrial fibrillation or ventricular tachycardia. At home, patient is on amiodarone 200 mg daily, carvedilol 12.5 mg twice a day, digoxin 0.5 mg daily, and Coumadin. Digoxin level within normal limits today. -Continue home amiodarone and digoxin. -Hold home carvedilol for now. -Continue home Coumadin.  Dispo: Disposition is deferred at this time, awaiting improvement of current medical problems. Anticipated discharge in approximately 3 day(s).   The patient does have a current PCP Lemmie Evens, MD) and does need an [facility] hospital follow-up appointment after discharge.  The patient does not have  transportation limitations that hinder transportation to clinic appointments.  Signed: Luan Moore, M.D., Ph.D. Internal Medicine Teaching Service, PGY-1 10/17/2014, 7:00 PM

## 2014-10-17 NOTE — ED Notes (Signed)
CRITICAL VALUE ALERT  Critical value received:  Glucose 571  Date of notification:  10/17/14  Time of notification:  8546  Critical value read back: yes  Nurse who received alert:  Angie Fava RN  MD notified (1st page):  NP Tysinger

## 2014-10-17 NOTE — ED Provider Notes (Signed)
CSN: [id number]     Arrival date & time 10/17/14  0930 History   First MD Initiated Contact with Patient 10/17/14 0945     Chief Complaint  Patient presents with  . Hypotension   (Consider location/radiation/quality/duration/timing/severity/associated sxs/prior Treatment) HPI [name] is a 69 yo female with history of CHF,presenting with report of hypotension.  She went to her cardiologist's office for a normal check-up when they noted her blood pressure was low to 86P systolic.  She states she has been feeling more generally fatigued over the last 3 days.  She reports taking lasix for her CHF and states she has required blood transfusions in the past when she became anemic.  She is not in any pain currently. She denies any chest pain, shortness of breath, palpitations, bloody stools, nausea, vomiting, or abd pain.  Past Medical History  Diagnosis Date  . Cardiomyopathy, nonischemic     a. 1999 nl cath;  b. 12/05 [employer] [place];  c. 10/2005 ICD extraction 2/2 enterococcus bacteremia and Veg on RV lead;  c. 05/2008 low risk Myoview (scarring w/ some evidence of inf ischemia);  d. 11/2012 Echo: EF 15-20%;  e. 01/2013 s/p MDT [place] CRT D, ser # [id number];  f. 05/2013 Echo: EF 15%.  . Enterococcal infection     a. 10/2005 - AICD-explanted  . Pulmonary embolism     a. 07/2005 after total right hip arthroplasty  . Hilar density     a. infrahilar mass/adenopathy on CT scan 5/07; subsequently  resolved  . LBBB (left bundle branch block)   . GERD (gastroesophageal reflux disease)   . Hyperlipidemia   . Hypertension   . Tobacco abuse     a. discontinued in 1997, and then resumed  . Urinary incontinence   . Anemia     a. mild/chronic  . Villous adenoma of colon     a. tubovillous adenomatous polyp with focal high grade dysplasia; presented with hematochezia - followed by Dr. Laural Golden.  . CKD (chronic kidney disease), stage III     creatinin-1.44 in 1/09; 1.51 in 1/10  . Implantable  cardioverter-defibrillator-CRT- Mdt     a.  01/2013 s/p MDT [place] CRT D, ser # [id number]  . Chronic systolic CHF (congestive heart failure)     a. 11/2012 Echo: EF 15-20%;  b. 05/2013 TEE EF 15%.  . Pneumonia 07/2005  . Obstructive sleep apnea     a. mild-did not tolerate CPAP (01/24/2013)  . Type II diabetes mellitus   . History of blood transfusion   . Degenerative joint disease     of knees, shoulder, and hips  . PAF (paroxysmal atrial fibrillation)     a. 07/2012 s/p TEE/DCCV;  b. chronic coumadin;  c. 05/2013 Recurrent Afib->TEE/DCCV and amio initiation.   Past Surgical History  Procedure Laterality Date  . Pacemaker removal  11/18/05    Enterococcal infection  . Total hip arthroplasty Right 07/2005  . Knee arthroscopy Right 1980's?  . Colonoscopy w/ polypectomy  2009  . Cardioversion N/A 08/20/2012    Procedure: TEE GUIDED CARDIOVERSION;  Surgeon: Yehuda Savannah, MD;  Location: [facility];  Service: Cardiovascular;  Laterality: N/A;  To be done @ bedside  . Tee without cardioversion N/A 08/20/2012    Procedure: TRANSESOPHAGEAL ECHOCARDIOGRAM (TEE);  Surgeon: Yehuda Savannah, MD;  Location: [facility];  Service: Cardiovascular;  Laterality: N/A;  . Bi-ventricular implantable cardioverter defibrillator  (crt-d)  01/24/2013  . A-v cardiac pacemaker insertion  12/05  Biventricular pacemaker/AICD  . Abdominal hysterectomy  1990/92    Initial partial hysterectomy followed by BSO  . Tubal ligation  1980's  . Cardiac catheterization    . Colonoscopy with esophagogastroduodenoscopy (egd) N/A 06/10/2013    Procedure: COLONOSCOPY WITH ESOPHAGOGASTRODUODENOSCOPY (EGD);  Surgeon: Rogene Houston, MD;  Location: [facility];  Service: Endoscopy;  Laterality: N/A;  925  . Tee without cardioversion N/A 06/20/2013    Procedure: TRANSESOPHAGEAL ECHOCARDIOGRAM (TEE);  Surgeon: Dorothy Spark, MD;  Location: [facility];  Service: Cardiovascular;  Laterality: N/A;  . Cardioversion N/A 06/20/2013     Procedure: CARDIOVERSION;  Surgeon: Dorothy Spark, MD;  Location: [facility] ENDOSCOPY;  Service: Cardiovascular;  Laterality: N/A;  . Bi-ventricular implantable cardioverter defibrillator N/A 01/24/2013    Procedure: BI-VENTRICULAR IMPLANTABLE CARDIOVERTER DEFIBRILLATOR  (CRT-D);  Surgeon: Evans Lance, MD;  Location: [facility] CATH LAB;  Service: Cardiovascular;  Laterality: N/A;  . Right heart catheterization N/A 04/18/2014    Procedure: RIGHT HEART CATH;  Surgeon: Larey Dresser, MD;  Location: [facility] CATH LAB;  Service: Cardiovascular;  Laterality: N/A;   Family History  Problem Relation Age of Onset  . Hypertension Mother   . Diabetes Mother   . Coronary artery disease Father   . Diabetes Brother   . Hypertension Brother   . Lung cancer Brother   . Arthritis Other   . Diabetes Other   . Heart disease Other     female < 55   History  Substance Use Topics  . Smoking status: Former Smoker -- 12 years    Types: Cigarettes  . Smokeless tobacco: Never Used     Comment: 05/2013: Smokes an occasional cigarette.  Says that she doesn't inhale.  . Alcohol Use: No   OB History    No data available     Review of Systems  Constitutional: Positive for fatigue. Negative for fever and chills.  HENT: Negative for sore throat.   Eyes: Negative for visual disturbance.  Respiratory: Negative for cough and shortness of breath.   Cardiovascular: Negative for chest pain and leg swelling.  Gastrointestinal: Negative for nausea, vomiting and diarrhea.  Genitourinary: Negative for dysuria.  Musculoskeletal: Negative for myalgias.  Skin: Negative for rash.  Neurological: Positive for light-headedness. Negative for weakness, numbness and headaches.      Allergies  Review of patient's allergies indicates no known allergies.  Home Medications   Prior to Admission medications   Medication Sig Start Date End Date Taking? Authorizing Provider  amiodarone (PACERONE) 200 MG tablet Take 1 tablet (200 mg  total) by mouth daily. 02/08/14  Yes Imogene Burn, PA-C  carvedilol (COREG) 25 MG tablet Take 12.5 mg by mouth 2 (two) times daily with a meal.   Yes Historical Provider, MD  cetirizine (ZYRTEC) 10 MG tablet Take 10 mg by mouth daily.   Yes Historical Provider, MD  colchicine 0.6 MG tablet Take 0.6 mg by mouth 2 (two) times daily.  12/20/13  Yes Historical Provider, MD  digoxin (LANOXIN) 0.125 MG tablet Take 0.5 tablets (0.0625 mg total) by mouth daily. 06/22/13  Yes Rogelia Mire, NP  furosemide (LASIX) 40 MG tablet Take 1.5 tablets (60 mg total) by mouth 2 (two) times daily. 05/30/14  Yes Larey Dresser, MD  lisinopril (PRINIVIL,ZESTRIL) 2.5 MG tablet Take 2.5 mg by mouth daily.   Yes Historical Provider, MD  metoCLOPramide (REGLAN) 5 MG tablet Take 5 mg by mouth 4 (four) times daily.   Yes Historical Provider, MD  ondansetron (ZOFRAN) 4 MG tablet Take 4 mg by mouth every 4 (four) hours as needed for nausea.  01/06/14  Yes Historical Provider, MD  oxyCODONE-acetaminophen (PERCOCET) 10-325 MG per tablet Take 1 tablet by mouth every 6 (six) hours as needed for pain.  12/09/13  Yes Historical Provider, MD  pravastatin (PRAVACHOL) 40 MG tablet Take 80 mg by mouth at bedtime.    Yes Historical Provider, MD  warfarin (COUMADIN) 5 MG tablet Take 5 mg by mouth one time only at 6 PM.  02/08/14  Yes Imogene Burn, PA-C  ivabradine (CORLANOR) 5 MG TABS tablet Take 0.5 tablets (2.5 mg total) by mouth 2 (two) times daily with a meal. Patient not taking: Reported on 10/17/2014 07/05/14   Shaune Pascal Bensimhon, MD   BP 110/54 mmHg  Pulse 70  Temp(Src) 97.7 F (36.5 C) (Oral)  Resp 18  Ht 5\' 5"  (1.651 m)  Wt 153 lb (69.4 kg)  BMI 25.46 kg/m2  SpO2 94% Physical Exam  Constitutional: She appears well-developed and well-nourished. No distress.  HENT:  Head: Normocephalic and atraumatic.  Mouth/Throat: Oropharynx is clear and moist.  Eyes: Conjunctivae are normal. Pupils are equal, round, and reactive to  light.  Neck: Neck supple.  Cardiovascular: Normal rate, regular rhythm and intact distal pulses.  Exam reveals no gallop and no friction rub.   Murmur heard.  Systolic murmur is present with a grade of 2/6  Pulmonary/Chest: Effort normal and breath sounds normal. No respiratory distress. She has no wheezes. She has no rales. She exhibits no tenderness.  Abdominal: Soft. She exhibits no distension and no mass. There is no tenderness. There is no rebound and no guarding.  Musculoskeletal: She exhibits no tenderness.  Lymphadenopathy:    She has no cervical adenopathy.  Neurological: She is alert. She has normal strength. No cranial nerve deficit or sensory deficit. GCS eye subscore is 4. GCS verbal subscore is 5. GCS motor subscore is 6.  Skin: Skin is warm and dry. No rash noted. She is not diaphoretic.  Psychiatric: She has a normal mood and affect.  Nursing note and vitals reviewed.   ED Course  Procedures (including critical care time) CRITICAL CARE Performed by: Britt Bottom   Total critical care time: 40 min  Critical care time was exclusive of separately billable procedures and treating other patients.  Critical care was necessary to treat or prevent imminent or life-threatening deterioration.  Critical care was time spent personally by me on the following activities: development of treatment plan with patient and/or surrogate as well as nursing, discussions with consultants, evaluation of patient's response to treatment, examination of patient, obtaining history from patient or surrogate, ordering and performing treatments and interventions, ordering and review of laboratory studies, ordering and review of radiographic studies, pulse oximetry and re-evaluation of patient's condition.  Labs Review Labs Reviewed  CBC WITH DIFFERENTIAL/PLATELET - Abnormal; Notable for the following:    RBC 2.20 (*)    Hemoglobin 6.7 (*)    HCT 19.8 (*)    Neutrophils Relative % 84 (*)     Lymphocytes Relative 11 (*)    All other components within normal limits  COMPREHENSIVE METABOLIC PANEL - Abnormal; Notable for the following:    Sodium 132 (*)    Chloride 93 (*)    Glucose, Bld 571 (*)    BUN 58 (*)    Creatinine, Ser 2.50 (*)    GFR calc non Af Amer 18 (*)    GFR calc Af  Amer 21 (*)    All other components within normal limits  URINALYSIS, ROUTINE W REFLEX MICROSCOPIC - Abnormal; Notable for the following:    Glucose, UA >1000 (*)    All other components within normal limits  CBG MONITORING, ED - Abnormal; Notable for the following:    Glucose-Capillary 489 (*)    All other components within normal limits  CBG MONITORING, ED - Abnormal; Notable for the following:    Glucose-Capillary 460 (*)    All other components within normal limits  TROPONIN I  URINE MICROSCOPIC-ADD ON  T4, FREE  BRAIN NATRIURETIC PEPTIDE  LACTATE DEHYDROGENASE  HAPTOGLOBIN  HEMOGLOBIN A1C  PREPARE RBC (CROSSMATCH)  TYPE AND SCREEN    Imaging Review Dg Chest 2 View  10/17/2014   CLINICAL DATA:  Dizziness, nausea 2 days  EXAM: CHEST  2 VIEW  COMPARISON:  01/10/2014  FINDINGS: There is no focal parenchymal opacity, pleural effusion, or pneumothorax. The heart and mediastinal contours are stable. There is a 3 lead AICD.  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  IMPRESSION: No active cardiopulmonary disease.   Electronically Signed   By: [name]   On: 10/17/2014 11:41     EKG Interpretation   Date/Time:  Tuesday October 17 2014 09:36:55 EDT Ventricular Rate:  78 PR Interval:  174 QRS Duration: 196 QT Interval:  488 QTC Calculation: 556 R Axis:   -97 Text Interpretation:  AV dual-paced rhythm Biventricular pacemaker  detected Abnormal ECG Confirmed by ZACKOWSKI  MD, SCOTT ([id number]) on  10/17/2014 12:39:26 PM      MDM   Final diagnoses:  Anemia, unspecified anemia type   69 yo presenting with fatigue, light-headedness and hypotension at MD's office today.  Pt  becomes orthostatic on exam when standing but bp is normal when lying down.  Discussed case with Dr. Rogene Houston.  CBC, CMP, Troponin, EKG, CXR and UA ordered. No IVF bolus due to pt's EF: 15-20%.  11:40 AM: RN alerted me to critical lab call, Hgb: 6.7 and glucose 571. Labs and imaging reviewed: CXR is negative, troponin is negative, BUN and creatinine near her baseline at 58, 2.50 respectively.  Pt T&C 2 units PRBC and glucostabilizer fro insulin control.    12:15 PM: Consulted cardiology regarding pt's case.  Pt will be admitted to medicine and they will consult.   12:30 PM: Consulted Dr. Gordy Levan (internal medicine) regarding pt's case.  Pt will be admitted to step down bed. All findings thus far and plan discussed with pt and husband.  The patient appears reasonably stabilized for admission considering the current resources, flow, and capabilities available in the ED at this time, and I doubt any  further screening and/or treatment in the ED prior to admission.   Filed Vitals:   10/17/14 1347 10/17/14 1435 10/17/14 1437 10/17/14 1445  BP: 106/51 106/53  104/50  Pulse:  70 70 69  Temp: 97.8 F (36.6 C)     TempSrc: Oral     Resp:   13 16  Height:      Weight:      SpO2:  98% 100% 92%   Meds given in ED:  Medications  insulin regular (NOVOLIN R,HUMULIN R) 250 Units in sodium chloride 0.9 % 250 mL (1 Units/mL) infusion (8 Units/hr Intravenous Rate/Dose Change 10/17/14 1404)  0.9 %  sodium chloride infusion (10 mL/hr Intravenous New Bag/Given 10/17/14 1338)    New Prescriptions   No medications on file  Britt Bottom, NP 10/17/14 [phone]

## 2014-10-17 NOTE — ED Notes (Signed)
NP at bedside.

## 2014-10-17 NOTE — ED Notes (Signed)
Per MD, infuse 1 unit over 2 hours.  Will monitor closely.

## 2014-10-17 NOTE — ED Notes (Signed)
Per [employer], no arrhythmias detected.

## 2014-10-17 NOTE — Progress Notes (Signed)
Received pt from ED on glucose stabilizer; no order for fluids once BS below a certain point. BS @ 1815 was 116. MD notified and RN requested orders.

## 2014-10-17 NOTE — ED Notes (Signed)
Charge RN notified to interrogate pacemaker.

## 2014-10-17 NOTE — Patient Instructions (Signed)
Labs today Your are being sent to the ER for further evaluation hypotension and ?anemia  HOLD Lisinopril until you return for follow up  Your physician recommends that you schedule a follow-up appointment in: 1 week  Do the following things EVERYDAY: 1) Weigh yourself in the morning before breakfast. Write it down and keep it in a log. 2) Take your medicines as prescribed 3) Eat low salt foods-Limit salt (sodium) to 2000 mg per day.  4) Stay as active as you can everyday 5) Limit all fluids for the day to less than 2 liters 6)

## 2014-10-17 NOTE — ED Notes (Signed)
Family at bedside. 

## 2014-10-17 NOTE — ED Provider Notes (Addendum)
Medical screening examination/treatment/procedure(s) were conducted as a shared visit with non-physician practitioner(s) and myself.  I personally evaluated the patient during the encounter.   EKG Interpretation   Date/Time:  Tuesday October 17 2014 09:36:55 EDT Ventricular Rate:  78 PR Interval:  174 QRS Duration: 196 QT Interval:  488 QTC Calculation: 556 R Axis:   -97 Text Interpretation:  AV dual-paced rhythm Biventricular pacemaker  detected Abnormal ECG Confirmed by Shyheem Whitham  MD, Zorion Nims [phone]) on  10/17/2014 12:39:26 PM     Results for orders placed or performed during the hospital encounter of 10/17/14  CBC with Differential  Result Value Ref Range   WBC 9.0 4.0 - 10.5 K/uL   RBC 2.20 (L) 3.87 - 5.11 MIL/uL   Hemoglobin 6.7 (LL) 12.0 - 15.0 g/dL   HCT 19.8 (L) 36.0 - 46.0 %   MCV 90.0 78.0 - 100.0 fL   MCH 30.5 26.0 - 34.0 pg   MCHC 33.8 30.0 - 36.0 g/dL   RDW 13.4 11.5 - 15.5 %   Platelets 200 150 - 400 K/uL   Neutrophils Relative % 84 (H) 43 - 77 %   Lymphocytes Relative 11 (L) 12 - 46 %   Monocytes Relative 4 3 - 12 %   Eosinophils Relative 1 0 - 5 %   Basophils Relative 0 0 - 1 %   Neutro Abs 7.5 1.7 - 7.7 K/uL   Lymphs Abs 1.0 0.7 - 4.0 K/uL   Monocytes Absolute 0.4 0.1 - 1.0 K/uL   Eosinophils Absolute 0.1 0.0 - 0.7 K/uL   Basophils Absolute 0.0 0.0 - 0.1 K/uL  Comprehensive metabolic panel  Result Value Ref Range   Sodium 132 (L) 135 - 145 mmol/L   Potassium 4.6 3.5 - 5.1 mmol/L   Chloride 93 (L) 96 - 112 mmol/L   CO2 28 19 - 32 mmol/L   Glucose, Bld 571 (HH) 70 - 99 mg/dL   BUN 58 (H) 6 - 23 mg/dL   Creatinine, Ser 2.50 (H) 0.50 - 1.10 mg/dL   Calcium 9.3 8.4 - 10.5 mg/dL   Total Protein 7.0 6.0 - 8.3 g/dL   Albumin 3.9 3.5 - 5.2 g/dL   AST 19 0 - 37 U/L   ALT 13 0 - 35 U/L   Alkaline Phosphatase 73 39 - 117 U/L   Total Bilirubin 0.8 0.3 - 1.2 mg/dL   GFR calc non Af Amer 18 (L) >90 mL/min   GFR calc Af Amer 21 (L) >90 mL/min   Anion gap 11 5 - 15   Troponin I  Result Value Ref Range   Troponin I 0.03 <0.031 ng/mL  Urinalysis, Routine w reflex microscopic  Result Value Ref Range   Color, Urine YELLOW YELLOW   APPearance CLEAR CLEAR   Specific Gravity, Urine 1.012 1.005 - 1.030   pH 6.5 5.0 - 8.0   Glucose, UA >1000 (A) NEGATIVE mg/dL   Hgb urine dipstick NEGATIVE NEGATIVE   Bilirubin Urine NEGATIVE NEGATIVE   Ketones, ur NEGATIVE NEGATIVE mg/dL   Protein, ur NEGATIVE NEGATIVE mg/dL   Urobilinogen, UA 0.2 0.0 - 1.0 mg/dL   Nitrite NEGATIVE NEGATIVE   Leukocytes, UA NEGATIVE NEGATIVE  Urine microscopic-add on  Result Value Ref Range   WBC, UA 0-2 <3 WBC/hpf   RBC / HPF 0-2 <3 RBC/hpf  CBG monitoring, ED  Result Value Ref Range   Glucose-Capillary 489 (H) 70 - 99 mg/dL  Prepare RBC  Result Value Ref Range   Order Confirmation  ORDER PROCESSED BY BLOOD BANK   Type and screen  Result Value Ref Range   ABO/RH(D) A NEG    Antibody Screen NEG    Sample Expiration 10/20/2014    Unit Number [id number]    Blood Component Type RBC LR PHER2    Unit division 00    Status of Unit ISSUED    Transfusion Status OK TO TRANSFUSE    Crossmatch Result Compatible    Unit Number [id number]    Blood Component Type RED CELLS,LR    Unit division 00    Status of Unit ALLOCATED    Transfusion Status OK TO TRANSFUSE    Crossmatch Result Compatible    Dg Chest 2 View  10/17/2014   CLINICAL DATA:  Dizziness, nausea 2 days  EXAM: CHEST  2 VIEW  COMPARISON:  01/10/2014  FINDINGS: There is no focal parenchymal opacity, pleural effusion, or pneumothorax. The heart and mediastinal contours are stable. There is a 3 lead AICD.  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  IMPRESSION: No active cardiopulmonary disease.   Electronically Signed   By: [name]   On: 10/17/2014 11:41     Patient seen by me. Patient referred in from cardiologist off this for hypotension and lightheadedness. Patient has a history of significant  cardiomyopathy. Patient had no chest pain or shortness of breath. Workup here revealed a significant anemia we'll require probably 2 units of the packed red blood cells it'll need to be done slowly due to her ejection fractions being so low. Cardiology contacted they are aware they will consult medicine will admit. In addition patient also had markedly elevated blood sugar started on glucose stabilizer.  Patient while lying down was for the most part asymptomatic. EKG showed a paced rhythm. Pacemaker was interrogated. No significant abnormalities.  Critical care time documented by mid-level. Total time between the two of [place] is 30 min.  CRITICAL CARE Performed by: Fredia Sorrow Total critical care time: 30 Critical care time was exclusive of separately billable procedures and treating other patients. Critical care was necessary to treat or prevent imminent or life-threatening deterioration. Critical care was time spent personally by me on the following activities: development of treatment plan with patient and/or surrogate as well as nursing, discussions with consultants, evaluation of patient's response to treatment, examination of patient, obtaining history from patient or surrogate, ordering and performing treatments and interventions, ordering and review of laboratory studies, ordering and review of radiographic studies, pulse oximetry and re-evaluation of patient's condition.  Fredia Sorrow, MD 10/17/14 1326  Fredia Sorrow, MD 10/17/14 [phone]

## 2014-10-17 NOTE — ED Notes (Signed)
Pt was sent by her cardiologist for low BP at 70 systolic this morning pt reports lightheadedness. Denies chest pain or SOB. Pt alert and oriented. NAD noted.

## 2014-10-17 NOTE — Progress Notes (Signed)
Patient ID: [name], female   DOB: 05/24/1946, 69 y.o.   MRN: [id number] PCP: Dr. Karie Kirks EP: Dr. Lovena Le  69 yo with history of chronic systolic CHF from nonischemic cardiomyopathy, prior PE, CKD, and paroxysmal atrial fibrillation presents for CHF clinic evaluation.  She has a long history of cardiomyopathy, dating back to 1999.  At that time, she had coronary angiography showing no significant disease. She was initially followed by Dr Lattie Haw, later by Dr. Lovena Le. She had her initial CRT-D device in 2005.  This was removed in 2007 due to enterococcal bacteremia with vegetation.  She had Medtronic CRT- D device placed in 8/14.  She has had trouble with paroxysmal atrial fibrillation, most recently she was put on amiodarone and cardioverted in 12/14.  TEE in 12/14 showed EF 15% but RV appeared normal.   Had RHC on 04/18/14 for possible low output. This showed volume depletion with normal cardiac output. Lasix held for a few days then cut back from 80 bid to 40 bid. Lisinopril also held but has been restarted.   After last appt in 12/15 she was noted to be more anemic and had transfusion at [facility].  It does not appear that she ever saw GI (there is a note that she "no showed" her appt).  She was also noted to have elevated TSH but did not come in for repeat testing.   She has trouble with arthritis and has to use a walker or cane. Ambulation has been chronically limited mostly by stiffness and not her breathing.  She is not very active. Today, weight is down 1 lb.  No orthopnea, PND, or chest pain.  Main complaint is that she has "felt bad" for 3-4 days now.  She has been nauseated but no vomiting.  She has felt sleepy and "dizzy."  No energy.  Short of breath when trying to walk fast but dyspnea is not a prominent symptom today.  SBP initially in the 70s when checked today, then 80/48 at recheck.  She denies melena/BRBPR.   Labs (7/15): K 3.5, creatinine 1.78, hemoglobin 9.4,m BNP 24607 Labs  (10/15): K 4.7, creatinine 2.3, digoxin 0.7, BNP 8376, HCT 30.9 Labs (04/18/14): K 4.3 creatinine 2.8  Labs (11/15): K 5.3, creatinine 1.9 Labs (12/15): K 4.5, creatinine 2.21, TSH 14.15, digoxin 1.0  Optivol shows fluid index below threshold with stable impedance. Not very active. No AF/VT.   PMH:  1. Chronic systolic CHF: Nonischemic cardiomyopathy.  1999 had LHC with normal coronaries.  12/05 had Guidant BiV ICD placed. 5/07 had enterococcal bacteremia with ICD lead vegetation, device extracted.  8/14 had placement of Medtronic BiV ICD device.  Cardiolite (12/09) with multiple wall segments showing scarring, some inferior ischemia.  Echo (6/14) with EF 15-20%, diffuse hypokinesis with some regionality, normal RV size and systolic function.  TEE (12/14) with EF 15%, normal RV size and systolic function. RHC (10/15) with mean RA 1, PA 27/8, mean PCWP 5, CI 2.71. 2. PE: 2/07 after right THR.  3. Right THR 4. LBBB 5. GERD 6. Hyperlipidemia 7. HTN 8. Anemia: 12/14 had EGD that was normal, colonoscopy with 3 polyps removed. Transfusion 12/15.  9. CKD 10. OSA: Mild, did not tolerate OSA 11. Atrial fibrillation: Paroxysmal.  TEE-guided DCCV in 2/14, TEE-guided DCCV in 12/14 on amiodarone.    SH: Married, lives in [place], previous smoker.   FH: Mother with MI.   ROS: All systems reviewed and negative except as per HPI.   Current Outpatient  Prescriptions  Medication Sig Dispense Refill  . amiodarone (PACERONE) 200 MG tablet Take 1 tablet (200 mg total) by mouth daily. 90 tablet 3  . carvedilol (COREG) 25 MG tablet Take 12.5 mg by mouth 2 (two) times daily with a meal.    . cetirizine (ZYRTEC) 10 MG tablet Take 10 mg by mouth daily.    . colchicine 0.6 MG tablet Take 0.6 mg by mouth 2 (two) times daily.     . digoxin (LANOXIN) 0.125 MG tablet Take 0.5 tablets (0.0625 mg total) by mouth daily. 30 tablet 3  . furosemide (LASIX) 40 MG tablet Take 1.5 tablets (60 mg total) by mouth 2 (two)  times daily. 90 tablet 3  . HYDROcodone-acetaminophen (NORCO) 10-325 MG per tablet Take 1 tablet by mouth every 8 (eight) hours as needed (pain).     Marland Kitchen lisinopril (PRINIVIL,ZESTRIL) 2.5 MG tablet Take 2.5 mg by mouth daily.    . metoCLOPramide (REGLAN) 5 MG tablet Take 5 mg by mouth 4 (four) times daily.    . ondansetron (ZOFRAN) 4 MG tablet Take 4 mg by mouth every 4 (four) hours as needed for nausea.     Marland Kitchen oxyCODONE-acetaminophen (PERCOCET) 10-325 MG per tablet Take 1 tablet by mouth every 6 (six) hours as needed for pain.     . polyethylene glycol powder (GLYCOLAX/MIRALAX) powder Take 17 g by mouth daily as needed (constipation).     . pravastatin (PRAVACHOL) 40 MG tablet Take 80 mg by mouth at bedtime.     Marland Kitchen warfarin (COUMADIN) 5 MG tablet Take 2.5-5 mg by mouth daily. Pt takes 2.5 mg on mon, tue, 5 mg on wed, 2.5 mg thu, fri, and 5 mg on sat, sun    . ivabradine (CORLANOR) 5 MG TABS tablet Take 0.5 tablets (2.5 mg total) by mouth 2 (two) times daily with a meal. (Patient not taking: Reported on 10/17/2014) 60 tablet 3   No current facility-administered medications for this encounter.    BP 80/48 mmHg  Pulse 87  Wt 153 lb 12.8 oz (69.763 kg)  SpO2 98% General: NAD, pale.  Neck: JVP not elevated, no thyromegaly or thyroid nodule.  Lungs: Clear to auscultation bilaterally with normal respiratory effort. CV: Nondisplaced PMI.  Heart regular S1/S2, no S3/S4, 1/6 early SEM RUSB.  No peripheral edema.  No carotid bruit.  Normal pedal pulses.  Abdomen: Soft, nontender, no hepatosplenomegaly, no distention.  Skin: Intact without lesions or rashes.  Neurologic: Alert and oriented x 3.  Psych: Normal affect. Extremities: No clubbing or cyanosis. Walks bent over with marked limp on R hip  HEENT: Normal.   Assessment/Plan: 1. Chronic systolic CHF: Patient has a nonischemic cardiomyopathy, most recent study was a TEE in 12/14 with EF 15% and normal RV size and systolic function.  NYHA class III  symptoms.  RHC (10/15) showed low filling pressures and normal cardiac output, Lasix was cut back at that time.  I have thought that her major limitation is musculoskeletal and not cardiac. Activity index on optivol is still very low. She is unable to do CPX testing and would not be LVAD candidate.Today, she is not volume overloaded by exam or Optivol.  She is hypotensive, pale, and feels bad. - I am going to send her to the ER for evaluation of hypotension and concern for recurrent symptomatic anemia.  She will hold BP-active meds for now pending ER evaluation.  - Continue digoxin unless creatinine is markedly increased, check level today.  2. Atrial fibrillation:  Paroxysmal.  She is in NSR by exam today.   - She will continue amiodarone for now.  TSH was high in 12/15 but she never returned for further testing.  Need to send TSH, free T4, and free T3 today.  If hypothyroid, will need treatment through PCP.  Need to check her LFTs today.  She will need eye exam at least yearly.  - She is on coumadin.  I am concerned for possible symptomatic anemia given her history.  CBC to be sent now.  Will then decide on what to do with her coumadin.  3. CKD: Check BMET today, folllow closely. 4. Anemia: She had EGD and c-scope in 12/14.  In 12/15, she had transfusion but apparently no-showed for her GI workup.  She denies BRBPR or melena but she looks pale today and is hypotensive.  I am concerned for recurrent symptomatic anemia.  She is going to be taken to the ER for further workup/evaluation this morning.   Followup in office in 1 week.   Loralie Champagne MD 10/17/2014

## 2014-10-17 NOTE — ED Notes (Signed)
CBG 182 mg/dL notified RN

## 2014-10-17 NOTE — ED Notes (Signed)
Attempted report 

## 2014-10-18 DIAGNOSIS — N179 Acute kidney failure, unspecified: Secondary | ICD-10-CM

## 2014-10-18 DIAGNOSIS — I959 Hypotension, unspecified: Secondary | ICD-10-CM

## 2014-10-18 DIAGNOSIS — E039 Hypothyroidism, unspecified: Secondary | ICD-10-CM

## 2014-10-18 DIAGNOSIS — Z79899 Other long term (current) drug therapy: Secondary | ICD-10-CM

## 2014-10-18 DIAGNOSIS — I255 Ischemic cardiomyopathy: Secondary | ICD-10-CM

## 2014-10-18 DIAGNOSIS — I509 Heart failure, unspecified: Secondary | ICD-10-CM | POA: Diagnosis not present

## 2014-10-18 DIAGNOSIS — D649 Anemia, unspecified: Secondary | ICD-10-CM

## 2014-10-18 DIAGNOSIS — I429 Cardiomyopathy, unspecified: Secondary | ICD-10-CM

## 2014-10-18 DIAGNOSIS — I9589 Other hypotension: Secondary | ICD-10-CM

## 2014-10-18 DIAGNOSIS — I480 Paroxysmal atrial fibrillation: Secondary | ICD-10-CM

## 2014-10-18 DIAGNOSIS — Z7901 Long term (current) use of anticoagulants: Secondary | ICD-10-CM

## 2014-10-18 DIAGNOSIS — E1122 Type 2 diabetes mellitus with diabetic chronic kidney disease: Secondary | ICD-10-CM

## 2014-10-18 DIAGNOSIS — N189 Chronic kidney disease, unspecified: Secondary | ICD-10-CM

## 2014-10-18 DIAGNOSIS — E1165 Type 2 diabetes mellitus with hyperglycemia: Secondary | ICD-10-CM

## 2014-10-18 LAB — SAVE SMEAR

## 2014-10-18 LAB — GLUCOSE, CAPILLARY
GLUCOSE-CAPILLARY: 375 mg/dL — AB (ref 70–99)
GLUCOSE-CAPILLARY: 352 mg/dL — AB (ref 70–99)
Glucose-Capillary: 230 mg/dL — ABNORMAL HIGH (ref 70–99)
Glucose-Capillary: 305 mg/dL — ABNORMAL HIGH (ref 70–99)
Glucose-Capillary: 390 mg/dL — ABNORMAL HIGH (ref 70–99)

## 2014-10-18 LAB — CBC
HCT: 22.1 % — ABNORMAL LOW (ref 36.0–46.0)
HEMATOCRIT: 22.6 % — AB (ref 36.0–46.0)
Hemoglobin: 7.6 g/dL — ABNORMAL LOW (ref 12.0–15.0)
Hemoglobin: 7.4 g/dL — ABNORMAL LOW (ref 12.0–15.0)
MCH: 29.8 pg (ref 26.0–34.0)
MCH: 30 pg (ref 26.0–34.0)
MCHC: 33.6 g/dL (ref 30.0–36.0)
MCHC: 33.5 g/dL (ref 30.0–36.0)
MCV: 88.6 fL (ref 78.0–100.0)
MCV: 89.5 fL (ref 78.0–100.0)
PLATELETS: 160 10*3/uL (ref 150–400)
Platelets: 159 10*3/uL (ref 150–400)
RBC: 2.55 MIL/uL — ABNORMAL LOW (ref 3.87–5.11)
RBC: 2.47 MIL/uL — ABNORMAL LOW (ref 3.87–5.11)
RDW: 14.7 % (ref 11.5–15.5)
RDW: 14.9 % (ref 11.5–15.5)
WBC: 5.6 10*3/uL (ref 4.0–10.5)
WBC: 5.1 10*3/uL (ref 4.0–10.5)

## 2014-10-18 LAB — RETICULOCYTES
RBC.: 2.52 MIL/uL — AB (ref 3.87–5.11)
Retic Count, Absolute: 52.9 10*3/uL (ref 19.0–186.0)
Retic Ct Pct: 2.1 % (ref 0.4–3.1)

## 2014-10-18 LAB — BASIC METABOLIC PANEL
Anion gap: 8 (ref 5–15)
BUN: 59 mg/dL — AB (ref 6–23)
CO2: 28 mmol/L (ref 19–32)
Calcium: 8.9 mg/dL (ref 8.4–10.5)
Chloride: 99 mmol/L (ref 96–112)
Creatinine, Ser: 2.46 mg/dL — ABNORMAL HIGH (ref 0.50–1.10)
GFR calc Af Amer: 22 mL/min — ABNORMAL LOW (ref >90)
GFR, EST NON AFRICAN AMERICAN: 19 mL/min — AB (ref >90)
GLUCOSE: 262 mg/dL — AB (ref 70–99)
Potassium: 4 mmol/L (ref 3.5–5.1)
SODIUM: 135 mmol/L (ref 135–145)

## 2014-10-18 LAB — PROTIME-INR
INR: 3.49 — AB (ref 0.00–1.49)
Prothrombin Time: 35.3 seconds — ABNORMAL HIGH (ref 11.6–15.2)

## 2014-10-18 LAB — HAPTOGLOBIN: Haptoglobin: 144 mg/dL (ref 34–200)

## 2014-10-18 LAB — IRON AND TIBC
IRON: 86 ug/dL (ref 42–145)
Saturation Ratios: 31 % (ref 20–55)
TIBC: 279 ug/dL (ref 250–470)
UIBC: 193 ug/dL (ref 125–400)

## 2014-10-18 LAB — HEMOGLOBIN A1C
Hgb A1c MFr Bld: 9.6 % — ABNORMAL HIGH (ref 4.8–5.6)
Mean Plasma Glucose: 229 mg/dL

## 2014-10-18 LAB — DIRECT ANTIGLOBULIN TEST (NOT AT ARMC)
DAT, COMPLEMENT: NEGATIVE
DAT, IGG: NEGATIVE

## 2014-10-18 LAB — VITAMIN B12: Vitamin B-12: 511 pg/mL (ref 211–911)

## 2014-10-18 LAB — T3, FREE: T3, Free: 2.3 pg/mL (ref 2.0–4.4)

## 2014-10-18 LAB — FOLATE: Folate: 8.4 ng/mL

## 2014-10-18 LAB — FERRITIN: FERRITIN: 18 ng/mL (ref 10–291)

## 2014-10-18 MED ORDER — BISACODYL 10 MG RE SUPP
10.0000 mg | Freq: Once | RECTAL | Status: AC
  Administered 2014-10-18: 10 mg via RECTAL
  Filled 2014-10-18: qty 1

## 2014-10-18 MED ORDER — DIGOXIN 125 MCG PO TABS
0.0625 mg | ORAL_TABLET | ORAL | Status: DC
  Administered 2014-10-18 – 2014-10-26 (×5): 0.0625 mg via ORAL
  Filled 2014-10-18 (×5): qty 1

## 2014-10-18 MED ORDER — SODIUM CHLORIDE 0.9 % IV SOLN: INTRAVENOUS | Status: DC

## 2014-10-18 MED ORDER — ENSURE ENLIVE PO LIQD
237.0000 mL | Freq: Two times a day (BID) | ORAL | Status: DC
  Administered 2014-10-18 – 2014-10-26 (×9): 237 mL via ORAL

## 2014-10-18 MED ORDER — POLYETHYLENE GLYCOL 3350 17 G PO PACK
17.0000 g | PACK | Freq: Every day | ORAL | Status: DC
  Administered 2014-10-18 – 2014-10-26 (×7): 17 g via ORAL
  Filled 2014-10-18 (×7): qty 1

## 2014-10-18 MED ORDER — INSULIN DETEMIR 100 UNIT/ML ~~LOC~~ SOLN
10.0000 [IU] | Freq: Every day | SUBCUTANEOUS | Status: DC
  Administered 2014-10-18: 10 [IU] via SUBCUTANEOUS
  Filled 2014-10-18 (×2): qty 0.1

## 2014-10-18 MED ORDER — CARVEDILOL 3.125 MG PO TABS
3.1250 mg | ORAL_TABLET | Freq: Two times a day (BID) | ORAL | Status: DC
  Administered 2014-10-18 – 2014-10-20 (×5): 3.125 mg via ORAL
  Filled 2014-10-18 (×7): qty 1

## 2014-10-18 NOTE — Progress Notes (Signed)
 Subjective:  Patient states that she is feeling a little better after her blood transfusion yesterday. She still states that she has some underlying fatigue. Otherwise, patient not reporting any complaints.  Objective: Vital signs in last 24 hours: Filed Vitals:   10/18/14 0200 10/18/14 0400 10/18/14 0500 10/18/14 0726  BP: 111/48 105/48  115/52  Pulse: 70 70  69  Temp:  98.4 F (36.9 C)  98.2 F (36.8 C)  TempSrc:  Oral  Axillary  Resp: 12 12  17  Height:      Weight:   141 lb 8.6 oz (64.2 kg)   SpO2: 94% 92%  97%   Weight change:   Intake/Output Summary (Last 24 hours) at 10/18/14 1128 Last data filed at 10/18/14 0918  Gross per 24 hour  Intake   1420 ml  Output      0 ml  Net   1420 ml    General: resting in bed HEENT: PERRL, EOMI, no scleral icterus, conjunctival pallor Cardiac: RRR, no rubs or gallops, 2/6 systolic murmur Pulm: clear to auscultation bilaterally, moving normal volumes of air Abd: soft, nontender, nondistended, BS present Ext: warm and well perfused, no pedal edema Neuro: alert and oriented X3, cranial nerves II-XII grossly intact Skin: no rashes or lesions noted Psych: appropriate affect  Lab Results: Basic Metabolic Panel:  Recent Labs Lab 10/17/14 1000 10/18/14 0304  NA 132* 135  K 4.6 4.0  CL 93* 99  CO2 28 28  GLUCOSE 571* 262*  BUN 58* 59*  CREATININE 2.50* 2.46*  CALCIUM 9.3 8.9   Liver Function Tests:  Recent Labs Lab 10/17/14 0915 10/17/14 1000  AST 14 19  ALT 11 13  ALKPHOS 71 73  BILITOT 0.5 0.8  PROT 6.8 7.0  ALBUMIN 3.7 3.9   No results for input(s): LIPASE, AMYLASE in the last 168 hours. No results for input(s): AMMONIA in the last 168 hours. CBC:  Recent Labs Lab 10/17/14 1000 10/18/14 0304  WBC 9.0 5.6  NEUTROABS 7.5  --   HGB 6.7* 7.6*  HCT 19.8* 22.6*  MCV 90.0 88.6  PLT 200 160   Cardiac Enzymes:  Recent Labs Lab 10/17/14 1000  TROPONINI 0.03   BNP: No results for input(s): PROBNP in  the last 168 hours. D-Dimer: No results for input(s): DDIMER in the last 168 hours. CBG:  Recent Labs Lab 10/17/14 1613 10/17/14 1716 10/17/14 1819 10/17/14 2232 10/18/14 0515 10/18/14 0725  GLUCAP 260* 182* 116* 311* 230* 305*   Hemoglobin A1C:  Recent Labs Lab 10/17/14 1909  HGBA1C 9.6*   Fasting Lipid Panel: No results for input(s): CHOL, HDL, LDLCALC, TRIG, CHOLHDL, LDLDIRECT in the last 168 hours. Thyroid Function Tests:  Recent Labs Lab 10/17/14 0915  TSH 19.740*  FREET4 0.83  T3FREE 2.3   Coagulation:  Recent Labs Lab 10/18/14 0850  LABPROT 35.3*  INR 3.49*   Anemia Panel:  Recent Labs Lab 10/18/14 0850  RETICCTPCT 2.1   Urine Drug Screen: Drugs of Abuse  No results found for: LABOPIA, COCAINSCRNUR, LABBENZ, AMPHETMU, THCU, LABBARB  Alcohol Level: No results for input(s): ETH in the last 168 hours. Urinalysis:  Recent Labs Lab 10/17/14 1050  COLORURINE YELLOW  LABSPEC 1.012  PHURINE 6.5  GLUCOSEU >1000*  HGBUR NEGATIVE  BILIRUBINUR NEGATIVE  KETONESUR NEGATIVE  PROTEINUR NEGATIVE  UROBILINOGEN 0.2  NITRITE NEGATIVE  LEUKOCYTESUR NEGATIVE   Micro Results: Recent Results (from the past 240 hour(s))  MRSA PCR Screening     Status: Abnormal     Collection Time: 10/17/14  6:09 PM  Result Value Ref Range Status   MRSA by PCR POSITIVE (A) NEGATIVE Final    Comment:        The GeneXpert MRSA Assay (FDA approved for NASAL specimens only), is one component of a comprehensive MRSA colonization surveillance program. It is not intended to diagnose MRSA infection nor to guide or monitor treatment for MRSA infections. RESULT CALLED TO, READ BACK BY AND VERIFIED WITH: Carley Hammed RN [id number] 10/17/14 A BROWNING    Studies/Results: Dg Chest 2 View  10/17/2014   CLINICAL DATA:  Dizziness, nausea 2 days  EXAM: CHEST  2 VIEW  COMPARISON:  01/10/2014  FINDINGS: There is no focal parenchymal opacity, pleural effusion, or pneumothorax. The heart and  mediastinal contours are stable. There is a 3 lead AICD.  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  IMPRESSION: No active cardiopulmonary disease.   Electronically Signed   By: [name]   On: 10/17/2014 11:41   Medications: I have reviewed the patient's current medications. Scheduled Meds: . amiodarone  200 mg Oral Daily  . carvedilol  3.125 mg Oral BID WC  . Chlorhexidine Gluconate Cloth  6 each Topical Q0600  . digoxin  0.0625 mg Oral QODAY  . insulin aspart  0-5 Units Subcutaneous QHS  . insulin aspart  0-9 Units Subcutaneous TID WC  . levothyroxine  25 mcg Oral QAC breakfast  . mupirocin ointment  1 application Nasal BID  . pantoprazole (PROTONIX) IV  40 mg Intravenous Q24H  . pravastatin  80 mg Oral QHS   Continuous Infusions:  PRN Meds:.acetaminophen **OR** acetaminophen, ondansetron **OR** ondansetron (ZOFRAN) IV Assessment/Plan: Principal Problem:   Hypotension Active Problems:   Cardiomyopathy, nonischemic   Diabetes mellitus, type 2   Anemia, normocytic normochromic   CHF (congestive heart failure)   Patient is a 69 year old with a history of symptomatic anemia, chronic kidney disease, type 2 diabetes, nonischemic cardiomyopathy status post ICD placement, paroxysmal atrial fibrillation who is admitted for symptomatic anemia and hypotension.  Symptomatic normocytic anemia: Patient's hemoglobin has risen to 7.6 from 6.4 status post 2 units of packed red blood cells. So far, there is a lower suspicion for blood loss as patient does not endorse any symptoms and patient has a normocytic anemia. Patient last worked up by GI with a colonoscopy and endoscopy in December 2014. Last anemia panel from January 2013 unremarkable for iron deficiency anemia or folate deficiency. Reticulocyte count yields a reticulocyte index of 0.6 which indicates a hypoproliferative process. This does not seem to be associated with a hemolytic process as patient's LDH was within normal  limits (haptoglobin also within normal limits although status post blood transfusion). Patient did have a markedly elevated TSH of 19.7 which would be consistent with a hypoproliferative normocytic, normochromic anemia, however, level of profound anemia is not typical of hypothyroidism. Chronic kidney disease could also play a role. Multiple myeloma is another consideration given worsening renal function in the setting of anemia in an elderly patient.  -Iron studies still pending although will be difficult to interpret given that they were taken after transfusion. However, ferritin could be somewhat reliable. Will consult GI if evidence of iron deficiency anemia. -Peripheral smear analysis pending -FOBT -Thyroid supplementation as below. -Anti-globulin and cold agglutinins sent  -Multiple myeloma panel.  Nonischemic cardiomyopathy: Status post placement of a Medtronic CRT-D device placed in August 2014. Interrogation showing no evidence of atrial fibrillation or ventricular tachycardia. Echocardiogram from 2014 with an ejection fraction  of 15%. Pacemaker in place. At home, patient is on Coreg 12.5 mg twice a day, lisinopril 2.5 mg daily. -Appreciate cardiology recommendations -Restart Coreg 3.125 mg twice a day. -Continue to hold lisinopril in the setting of elevated creatinine. -Hold Lasix today given you bulimia. -Change digoxin to every other day dosing given a level of 1.1 yesterday. -Hold home Coreg and lisinopril at this point given low blood pressures. -Repeat echocardiogram -No signs of fluid overload status post blood transfusion  Hypotension: Resolved. -Fluid resuscitate if necessary  Acute? on chronic kidney disease: Patient's creatinine has remained stable at 2.46 from 2.50 upon admission. This may represent a new baseline as patient has gradually worsened in terms of her chronic kidney disease over the last several months. However, there is also a concurrent rise in BUN which  suggests a prerenal etiology. No concurrent electrolyte abnormalities. -Continue to monitor. -Holding lisinopril. -Holding Lasix -Multiple myeloma panel as above  Type 2 diabetes: After a period of being on the insulin drip, patient's blood glucoses have remained elevated between 2:30-311. -Continue with sliding scale insulin -Add basal Levemir 10 units daily.  Hypothyroidism: TSH of 19.7. Free T4 within normal limits of 0.83 although patient may be symptomatic from her thyroid state which argues against subclinical hypothyroidism. At home, patient is not on any thyroid supplementation.  -Synthroid 25 g daily  Paroxysmal atrial fibrillation: Last cardioverted in December 2014. Interrogation in cardiologist clinic unremarkable for any atrial fibrillation or ventricular tachycardia. At home, patient is on amiodarone 200 mg daily, carvedilol 12.5 mg twice a day, digoxin 0.5 mg daily, and Coumadin. Digoxin level within normal limits today. -Continue home amiodarone and digoxin. -Resume home carvedilol 3.125 mg twice a day. -Hold home Coumadin given given INR of 3.49. May resume pending iron studies.  Dispo: Disposition is deferred at this time, awaiting improvement of current medical problems. Anticipated discharge in approximately 3 day(s).   The patient does have a current PCP Lemmie Evens, MD) and does need an [facility] hospital follow-up appointment after discharge.  The patient does not have transportation limitations that hinder transportation to clinic appointments.   LOS: 1 day   Services Needed at time of discharge: Y = Yes, Blank = No PT:   OT:   RN:   Equipment:   Other:    Luan Moore, MD 10/18/2014, 11:28 AM

## 2014-10-18 NOTE — Progress Notes (Signed)
Patient ID: [name], female   DOB: 17-Jun-1946, 69 y.o.   MRN: [id number]   SUBJECTIVE: Patient was admitted with profound anemia (hgb 6.4), weakness, and hypotension.  She has received 2 units PRBCs, SBP now in 100s and hgb to 7.6 today.  She feels better.   Scheduled Meds: . amiodarone  200 mg Oral Daily  . carvedilol  3.125 mg Oral BID WC  . Chlorhexidine Gluconate Cloth  6 each Topical Q0600  . digoxin  0.0625 mg Oral QODAY  . insulin aspart  0-5 Units Subcutaneous QHS  . insulin aspart  0-9 Units Subcutaneous TID WC  . levothyroxine  25 mcg Oral QAC breakfast  . mupirocin ointment  1 application Nasal BID  . pantoprazole (PROTONIX) IV  40 mg Intravenous Q24H  . pravastatin  80 mg Oral QHS   Continuous Infusions:  PRN Meds:.acetaminophen **OR** acetaminophen, ondansetron **OR** ondansetron (ZOFRAN) IV   Filed Vitals:   10/18/14 0200 10/18/14 0400 10/18/14 0500 10/18/14 0726  BP: 111/48 105/48  115/52  Pulse: 70 70  69  Temp:  98.4 F (36.9 C)  98.2 F (36.8 C)  TempSrc:  Oral  Axillary  Resp: 12 12  17   Height:      Weight:   141 lb 8.6 oz (64.2 kg)   SpO2: 94% 92%  97%    Intake/Output Summary (Last 24 hours) at 10/18/14 0730 Last data filed at 10/18/14 0500  Gross per 24 hour  Intake    940 ml  Output      0 ml  Net    940 ml    LABS: Basic Metabolic Panel:  Recent Labs  10/17/14 1000 10/18/14 0304  NA 132* 135  K 4.6 4.0  CL 93* 99  CO2 28 28  GLUCOSE 571* 262*  BUN 58* 59*  CREATININE 2.50* 2.46*  CALCIUM 9.3 8.9   Liver Function Tests:  Recent Labs  10/17/14 0915 10/17/14 1000  AST 14 19  ALT 11 13  ALKPHOS 71 73  BILITOT 0.5 0.8  PROT 6.8 7.0  ALBUMIN 3.7 3.9   No results for input(s): LIPASE, AMYLASE in the last 72 hours. CBC:  Recent Labs  10/17/14 1000 10/18/14 0304  WBC 9.0 5.6  NEUTROABS 7.5  --   HGB 6.7* 7.6*  HCT 19.8* 22.6*  MCV 90.0 88.6  PLT 200 160   Cardiac Enzymes:  Recent Labs  10/17/14 1000    TROPONINI 0.03   BNP: Invalid input(s): POCBNP D-Dimer: No results for input(s): DDIMER in the last 72 hours. Hemoglobin A1C: No results for input(s): HGBA1C in the last 72 hours. Fasting Lipid Panel: No results for input(s): CHOL, HDL, LDLCALC, TRIG, CHOLHDL, LDLDIRECT in the last 72 hours. Thyroid Function Tests:  Recent Labs  10/17/14 0915  TSH 19.740*  T3FREE 2.3   Anemia Panel: No results for input(s): VITAMINB12, FOLATE, FERRITIN, TIBC, IRON, RETICCTPCT in the last 72 hours.  RADIOLOGY: Dg Chest 2 View  10/17/2014   CLINICAL DATA:  Dizziness, nausea 2 days  EXAM: CHEST  2 VIEW  COMPARISON:  01/10/2014  FINDINGS: There is no focal parenchymal opacity, pleural effusion, or pneumothorax. The heart and mediastinal contours are stable. There is a 3 lead AICD.  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  IMPRESSION: No active cardiopulmonary disease.   Electronically Signed   By: [name]   On: 10/17/2014 11:41    PHYSICAL EXAM General: NAD Neck: No JVD, no thyromegaly or thyroid nodule.  Lungs: Clear to auscultation bilaterally with normal respiratory effort. CV: Nondisplaced PMI.  Heart regular S1/S2, no S3/S4, 2/6 HSM LLSB/apex.  No peripheral edema.  No carotid bruit.  Normal pedal pulses.  Abdomen: Soft, nontender, no hepatosplenomegaly, no distention.  Neurologic: Alert and oriented x 3.  Psych: Normal affect. Extremities: No clubbing or cyanosis.   TELEMETRY: Reviewed telemetry pt in A-BiV paced  ASSESSMENT AND PLAN: 69 yo with nonischemic CMP/chronic systolic CHF, paroxysmal atrial fibrillation, CKD, and history of anemia presented with profound anemia, weakness and hypotension.  1. Chronic systolic CHF: Patient has a nonischemic cardiomyopathy, most recent study was a TEE in 12/14 with EF 15% and normal RV size and systolic function. NYHA class III symptoms. RHC (10/15) showed low filling pressures and normal cardiac output. I have thought that her  major limitation is musculoskeletal and not cardiac. Activity index on optivol is still very low. She is unable to do CPX testing and would not be LVAD candidate.Today, she is not volume overloaded by exam or Optivol. At admission, Coreg, Lasix, and lisinopril were held. - Can restart Coreg 3.125 mg bid.  - Stay off lisinopril with elevated creatinine.  - Volume ok, would hold Lasix today.  - Echo today.  - Change digoxin to every other day with level 1.1.  2. Atrial fibrillation: Paroxysmal. She is a-paced currently.  - She will continue amiodarone for now. TSH high, Levoxyl begun. - She has been on coumadin. Need to send INR.  Given anemia, needs GI workup.  Hold coumadin for now.  3. AKI on CKD: Follow closely. Stopping lisinopril and holding Lasix for now.  4. Anemia: She had EGD and c-scope in 12/14. In 12/15, she had transfusion but apparently no-showed for her GI workup. She denies BRBPR or melena but was profoundly anemic at admission.  She has had 2 units PRBCs so far.  Given concern for active bleeding, would transfuse for Hgb < 8.  She will need a GI workup (check INR today). If this is unremarkable, ?hematology. Send FOBT and iron studies.   Loralie Champagne 10/18/2014 7:36 AM

## 2014-10-18 NOTE — Progress Notes (Addendum)
Inpatient Diabetes Program Recommendations  AACE/ADA: New Consensus Statement on Inpatient Glycemic Control (2013)  Target Ranges:  Prepandial:   less than 140 mg/dL      Peak postprandial:   less than 180 mg/dL (1-2 hours)      Critically ill patients:  140 - 180 mg/dL    Results for PRISHA, HILEY (MRN [id number]) as of 10/18/2014 08:12  Ref. Range 10/17/2014 18:19 10/17/2014 22:32 10/18/2014 05:15 10/18/2014 07:25  Glucose-Capillary Latest Ref Range: 70-99 mg/dL 116 (H) 311 (H) 230 (H) 305 (H)   Reason for Visit: Anemia  Diabetes history: DM 2 Outpatient Diabetes medications: None Current orders for Inpatient glycemic control: Novolog 0-9 units TID  A1c 9.6% on 10/17/14 not accurate due to anemia  Inpatient Diabetes Program Recommendations Insulin - Basal: Due to the level of hyperglycemia on admission, patient will require basal insulin. Patient was not placed on basal insulin when transitioned off of IV insulin. Patient already has glucose over 300 mg/dl. Please consider ordering Levemir 10 units Q24 hrs.  Thanks,  Tama Headings RN, MSN, [facility] Inpatient Diabetes Coordinator Team Pager [phone]

## 2014-10-18 NOTE — Consult Note (Signed)
UNASSIGNED PATIENT Reason for Consult: Anemia. Referring Physician: [facility]  [name] is an 69 y.o. female.  HPI: 69 year old white female, followed by Dr. Laural Golden in [place], admitted yesterday after she was found to be hypotensive at her cardiologists office. Noted to have a hemoglobin of 6.7 gm/dl on admission and received 2 units of PRBC's. She claims he has had a normal colonoscopy last year but I was unable to find a report in Epic. She has also has colonic polyps removed over 10 years prior to her last colonoscopy. She denies having any melena. Occasionally she has noticed some BRBPR when she has hard stools but this does not happen very often. She has a good appetite and her weight has been stable. She denies a previous history of ulcers, jaundice or colitis. She has been on PPI's for GERD.    Past Medical History  Diagnosis Date  . Cardiomyopathy, nonischemic     a. 1999 nl cath;  b. 12/05 [facility] [place];  c. 10/2005 ICD extraction 2/2 enterococcus bacteremia and Veg on RV lead;  c. 05/2008 low risk Myoview (scarring w/ some evidence of inf ischemia);  d. 11/2012 Echo: EF 15-20%;  e. 01/2013 s/p MDT [place] CRT D, ser # [id number];  f. 05/2013 Echo: EF 15%.  . Enterococcal infection     a. 10/2005 - AICD-explanted  . Pulmonary embolism     a. 07/2005 after total right hip arthroplasty  . Hilar density     a. infrahilar mass/adenopathy on CT scan 5/07; subsequently  resolved  . LBBB (left bundle branch block)   . GERD (gastroesophageal reflux disease)   . Hyperlipidemia   . Hypertension   . Tobacco abuse     a. discontinued in 1997, and then resumed  . Urinary incontinence   . Anemia     a. mild/chronic  . Villous adenoma of colon     a. tubovillous adenomatous polyp with focal high grade dysplasia; presented with hematochezia - followed by Dr. Laural Golden.  . CKD (chronic kidney disease), stage III     creatinin-1.44 in 1/09; 1.51 in 1/10  . Implantable  cardioverter-defibrillator-CRT- Mdt     a.  01/2013 s/p MDT [place] CRT D, ser # [id number]  . Chronic systolic CHF (congestive heart failure)     a. 11/2012 Echo: EF 15-20%;  b. 05/2013 TEE EF 15%.  . Pneumonia 07/2005  . Obstructive sleep apnea     a. mild-did not tolerate CPAP (01/24/2013)  . Type II diabetes mellitus   . History of blood transfusion   . Degenerative joint disease     of knees, shoulder, and hips  . PAF (paroxysmal atrial fibrillation)     a. 07/2012 s/p TEE/DCCV;  b. chronic coumadin;  c. 05/2013 Recurrent Afib->TEE/DCCV and amio initiation.   Past Surgical History  Procedure Laterality Date  . Pacemaker removal  11/18/05    Enterococcal infection  . Total hip arthroplasty Right 07/2005  . Knee arthroscopy Right 1980's?  . Colonoscopy w/ polypectomy  2009  . Cardioversion N/A 08/20/2012    Procedure: TEE GUIDED CARDIOVERSION;  Surgeon: Yehuda Savannah, MD;  Location: [facility];  Service: Cardiovascular;  Laterality: N/A;  To be done @ bedside  . Tee without cardioversion N/A 08/20/2012    Procedure: TRANSESOPHAGEAL ECHOCARDIOGRAM (TEE);  Surgeon: Yehuda Savannah, MD;  Location: [facility];  Service: Cardiovascular;  Laterality: N/A;  . Bi-ventricular implantable cardioverter defibrillator  (crt-d)  01/24/2013  . A-v cardiac  pacemaker insertion  12/05    Biventricular pacemaker/AICD  . Abdominal hysterectomy  1990/92    Initial partial hysterectomy followed by BSO  . Tubal ligation  1980's  . Cardiac catheterization    . Colonoscopy with esophagogastroduodenoscopy (egd) N/A 06/10/2013    Procedure: COLONOSCOPY WITH ESOPHAGOGASTRODUODENOSCOPY (EGD);  Surgeon: Rogene [place], MD;  Location: [facility];  Service: Endoscopy;  Laterality: N/A;  925  . Tee without cardioversion N/A 06/20/2013    Procedure: TRANSESOPHAGEAL ECHOCARDIOGRAM (TEE);  Surgeon: Dorothy Spark, MD;  Location: [facility];  Service: Cardiovascular;  Laterality: N/A;  . Cardioversion N/A 06/20/2013     Procedure: CARDIOVERSION;  Surgeon: Dorothy Spark, MD;  Location: [facility] ENDOSCOPY;  Service: Cardiovascular;  Laterality: N/A;  . Bi-ventricular implantable cardioverter defibrillator N/A 01/24/2013    Procedure: BI-VENTRICULAR IMPLANTABLE CARDIOVERTER DEFIBRILLATOR  (CRT-D);  Surgeon: Evans Lance, MD;  Location: [facility] CATH LAB;  Service: Cardiovascular;  Laterality: N/A;  . Right heart catheterization N/A 04/18/2014    Procedure: RIGHT HEART CATH;  Surgeon: Larey Dresser, MD;  Location: [facility] CATH LAB;  Service: Cardiovascular;  Laterality: N/A;   Family History  Problem Relation Age of Onset  . Hypertension Mother   . Diabetes Mother   . Coronary artery disease Father   . Diabetes Brother   . Hypertension Brother   . Lung cancer Brother   . Arthritis Other   . Diabetes Other   . Heart disease Other     female < 37    Social History:  reports that she has quit smoking. Her smoking use included Cigarettes. She quit after 12 years of use. She has never used smokeless tobacco. She reports that she does not drink alcohol or use illicit drugs.  Allergies: No Known Allergies  Medications: I have reviewed the patient's current medications.  Results for orders placed or performed during the hospital encounter of 10/17/14 (from the past 48 hour(s))  CBC with Differential     Status: Abnormal   Collection Time: 10/17/14 10:00 AM  Result Value Ref Range   WBC 9.0 4.0 - 10.5 K/uL   RBC 2.20 (L) 3.87 - 5.11 MIL/uL   Hemoglobin 6.7 (LL) 12.0 - 15.0 g/dL    Comment: REPEATED TO VERIFY CRITICAL VALUE NOTED.  VALUE IS CONSISTENT WITH PREVIOUSLY REPORTED AND CALLED VALUE.    HCT 19.8 (L) 36.0 - 46.0 %   MCV 90.0 78.0 - 100.0 fL   MCH 30.5 26.0 - 34.0 pg   MCHC 33.8 30.0 - 36.0 g/dL   RDW 13.4 11.5 - 15.5 %   Platelets 200 150 - 400 K/uL   Neutrophils Relative % 84 (H) 43 - 77 %   Lymphocytes Relative 11 (L) 12 - 46 %   Monocytes Relative 4 3 - 12 %   Eosinophils Relative 1 0 - 5 %    Basophils Relative 0 0 - 1 %   Neutro Abs 7.5 1.7 - 7.7 K/uL   Lymphs Abs 1.0 0.7 - 4.0 K/uL   Monocytes Absolute 0.4 0.1 - 1.0 K/uL   Eosinophils Absolute 0.1 0.0 - 0.7 K/uL   Basophils Absolute 0.0 0.0 - 0.1 K/uL  Comprehensive metabolic panel     Status: Abnormal   Collection Time: 10/17/14 10:00 AM  Result Value Ref Range   Sodium 132 (L) 135 - 145 mmol/L   Potassium 4.6 3.5 - 5.1 mmol/L   Chloride 93 (L) 96 - 112 mmol/L   CO2 28 19 - 32  mmol/L   Glucose, Bld 571 (HH) 70 - 99 mg/dL    Comment: REPEATED TO VERIFY CRITICAL RESULT CALLED TO, READ BACK BY AND VERIFIED WITH: [facility] RN @ 1125 10/17/14 LEONARD,A    BUN 58 (H) 6 - 23 mg/dL   Creatinine, Ser 2.50 (H) 0.50 - 1.10 mg/dL   Calcium 9.3 8.4 - 10.5 mg/dL   Total Protein 7.0 6.0 - 8.3 g/dL   Albumin 3.9 3.5 - 5.2 g/dL   AST 19 0 - 37 U/L   ALT 13 0 - 35 U/L   Alkaline Phosphatase 73 39 - 117 U/L   Total Bilirubin 0.8 0.3 - 1.2 mg/dL   GFR calc non Af Amer 18 (L) >90 mL/min   GFR calc Af Amer 21 (L) >90 mL/min    Comment: (NOTE) The eGFR has been calculated using the CKD EPI equation. This calculation has not been validated in all clinical situations. eGFR's persistently <90 mL/min signify possible Chronic Kidney Disease.    Anion gap 11 5 - 15  Troponin I     Status: None   Collection Time: 10/17/14 10:00 AM  Result Value Ref Range   Troponin I 0.03 <0.031 ng/mL    Comment:        NO INDICATION OF MYOCARDIAL INJURY.   Urinalysis, Routine w reflex microscopic     Status: Abnormal   Collection Time: 10/17/14 10:50 AM  Result Value Ref Range   Color, Urine YELLOW YELLOW   APPearance CLEAR CLEAR   Specific Gravity, Urine 1.012 1.005 - 1.030   pH 6.5 5.0 - 8.0   Glucose, UA >1000 (A) NEGATIVE mg/dL   Hgb urine dipstick NEGATIVE NEGATIVE   Bilirubin Urine NEGATIVE NEGATIVE   Ketones, ur NEGATIVE NEGATIVE mg/dL   Protein, ur NEGATIVE NEGATIVE mg/dL   Urobilinogen, UA 0.2 0.0 - 1.0 mg/dL   Nitrite NEGATIVE  NEGATIVE   Leukocytes, UA NEGATIVE NEGATIVE  Urine microscopic-add on     Status: None   Collection Time: 10/17/14 10:50 AM  Result Value Ref Range   WBC, UA 0-2 <3 WBC/hpf   RBC / HPF 0-2 <3 RBC/hpf  Prepare RBC     Status: None   Collection Time: 10/17/14 12:08 PM  Result Value Ref Range   Order Confirmation ORDER PROCESSED BY BLOOD BANK   Type and screen     Status: None   Collection Time: 10/17/14 12:08 PM  Result Value Ref Range   ABO/RH(D) A NEG    Antibody Screen NEG    Sample Expiration 10/20/2014    Unit Number [id number]    Blood Component Type RBC LR PHER2    Unit division 00    Status of Unit ISSUED,FINAL    Transfusion Status OK TO TRANSFUSE    Crossmatch Result Compatible    Unit Number [id number]    Blood Component Type RED CELLS,LR    Unit division 00    Status of Unit ISSUED,FINAL    Transfusion Status OK TO TRANSFUSE    Crossmatch Result Compatible   CBG monitoring, ED     Status: Abnormal   Collection Time: 10/17/14 12:52 PM  Result Value Ref Range   Glucose-Capillary 489 (H) 70 - 99 mg/dL  CBG monitoring, ED     Status: Abnormal   Collection Time: 10/17/14  2:00 PM  Result Value Ref Range   Glucose-Capillary 460 (H) 70 - 99 mg/dL  CBG monitoring, ED     Status: Abnormal   Collection Time: 10/17/14  3:07  PM  Result Value Ref Range   Glucose-Capillary 390 (H) 70 - 99 mg/dL  CBG monitoring, ED     Status: Abnormal   Collection Time: 10/17/14  4:13 PM  Result Value Ref Range   Glucose-Capillary 260 (H) 70 - 99 mg/dL  CBG monitoring, ED     Status: Abnormal   Collection Time: 10/17/14  5:16 PM  Result Value Ref Range   Glucose-Capillary 182 (H) 70 - 99 mg/dL   Comment 1 Notify RN    Comment 2 Document in Chart   MRSA PCR Screening     Status: Abnormal   Collection Time: 10/17/14  6:09 PM  Result Value Ref Range   MRSA by PCR POSITIVE (A) NEGATIVE    Comment:        The GeneXpert MRSA Assay (FDA approved for NASAL specimens only), is one  component of a comprehensive MRSA colonization surveillance program. It is not intended to diagnose MRSA infection nor to guide or monitor treatment for MRSA infections. RESULT CALLED TO, READ BACK BY AND VERIFIED WITH: Carley Hammed RN [id number] 10/17/14 A BROWNING   Glucose, capillary     Status: Abnormal   Collection Time: 10/17/14  6:19 PM  Result Value Ref Range   Glucose-Capillary 116 (H) 70 - 99 mg/dL  Brain natriuretic peptide     Status: Abnormal   Collection Time: 10/17/14  7:09 PM  Result Value Ref Range   B Natriuretic Peptide 488.1 (H) 0.0 - 100.0 pg/mL  Lactate dehydrogenase     Status: None   Collection Time: 10/17/14  7:09 PM  Result Value Ref Range   LDH 157 94 - 250 U/L  Haptoglobin     Status: None   Collection Time: 10/17/14  7:09 PM  Result Value Ref Range   Haptoglobin 144 34 - 200 mg/dL    Comment: (NOTE) Performed At: [facility] [place], [place] [id number] Lindon Romp MD [id number]   Hemoglobin A1c     Status: Abnormal   Collection Time: 10/17/14  7:09 PM  Result Value Ref Range   Hgb A1c MFr Bld 9.6 (H) 4.8 - 5.6 %    Comment: (NOTE)         Pre-diabetes: 5.7 - 6.4         Diabetes: >6.4         Glycemic control for adults with diabetes: <7.0    Mean Plasma Glucose 229 mg/dL    Comment: (NOTE) Performed At: [facility] [address] [place], [place] [id number] Lindon Romp MD LY:[id number]   Glucose, capillary     Status: Abnormal   Collection Time: 10/17/14 10:32 PM  Result Value Ref Range   Glucose-Capillary 311 (H) 70 - 99 mg/dL  CBC     Status: Abnormal   Collection Time: 10/18/14  3:04 AM  Result Value Ref Range   WBC 5.6 4.0 - 10.5 K/uL   RBC 2.55 (L) 3.87 - 5.11 MIL/uL   Hemoglobin 7.6 (L) 12.0 - 15.0 g/dL   HCT 22.6 (L) 36.0 - 46.0 %   MCV 88.6 78.0 - 100.0 fL   MCH 29.8 26.0 - 34.0 pg   MCHC 33.6 30.0 - 36.0 g/dL   RDW 14.7 11.5 - 15.5 %   Platelets 160 150 - 400 K/uL  Basic metabolic  panel     Status: Abnormal   Collection Time: 10/18/14  3:04 AM  Result Value Ref Range   Sodium 135 135 - 145  mmol/L   Potassium 4.0 3.5 - 5.1 mmol/L   Chloride 99 96 - 112 mmol/L   CO2 28 19 - 32 mmol/L   Glucose, Bld 262 (H) 70 - 99 mg/dL   BUN 59 (H) 6 - 23 mg/dL   Creatinine, Ser 2.46 (H) 0.50 - 1.10 mg/dL   Calcium 8.9 8.4 - 10.5 mg/dL   GFR calc non Af Amer 19 (L) >90 mL/min   GFR calc Af Amer 22 (L) >90 mL/min    Comment: (NOTE) The eGFR has been calculated using the CKD EPI equation. This calculation has not been validated in all clinical situations. eGFR's persistently <90 mL/min signify possible Chronic Kidney Disease.    Anion gap 8 5 - 15  Glucose, capillary     Status: Abnormal   Collection Time: 10/18/14  5:15 AM  Result Value Ref Range   Glucose-Capillary 230 (H) 70 - 99 mg/dL  Glucose, capillary     Status: Abnormal   Collection Time: 10/18/14  7:25 AM  Result Value Ref Range   Glucose-Capillary 305 (H) 70 - 99 mg/dL  Reticulocytes     Status: Abnormal   Collection Time: 10/18/14  8:50 AM  Result Value Ref Range   Retic Ct Pct 2.1 0.4 - 3.1 %   RBC. 2.52 (L) 3.87 - 5.11 MIL/uL   Retic Count, Manual 52.9 19.0 - 186.0 K/uL  Protime-INR     Status: Abnormal   Collection Time: 10/18/14  8:50 AM  Result Value Ref Range   Prothrombin Time 35.3 (H) 11.6 - 15.2 seconds   INR 3.49 (H) 0.00 - 1.49  Save smear     Status: None   Collection Time: 10/18/14  8:50 AM  Result Value Ref Range   Smear Review SMEAR STAINED AND AVAILABLE FOR REVIEW   CBC     Status: Abnormal   Collection Time: 10/18/14 11:39 AM  Result Value Ref Range   WBC 5.1 4.0 - 10.5 K/uL   RBC 2.47 (L) 3.87 - 5.11 MIL/uL   Hemoglobin 7.4 (L) 12.0 - 15.0 g/dL   HCT 22.1 (L) 36.0 - 46.0 %   MCV 89.5 78.0 - 100.0 fL   MCH 30.0 26.0 - 34.0 pg   MCHC 33.5 30.0 - 36.0 g/dL   RDW 14.9 11.5 - 15.5 %   Platelets 159 150 - 400 K/uL  Direct antiglobulin test     Status: None   Collection Time: 10/18/14  12:50 PM  Result Value Ref Range   DAT, complement NEG    DAT, IgG NEG   Glucose, capillary     Status: Abnormal   Collection Time: 10/18/14 12:52 PM  Result Value Ref Range   Glucose-Capillary 375 (H) 70 - 99 mg/dL   Dg Chest 2 View  10/17/2014   CLINICAL DATA:  Dizziness, nausea 2 days  EXAM: CHEST  2 VIEW  COMPARISON:  01/10/2014  FINDINGS: There is no focal parenchymal opacity, pleural effusion, or pneumothorax. The heart and mediastinal contours are stable. There is a 3 lead AICD.  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  IMPRESSION: No active cardiopulmonary disease.   Electronically Signed   By: [name]   On: 10/17/2014 11:41   Review of Systems  HENT: Negative.   Eyes: Negative.   Respiratory: Negative.   Cardiovascular: Negative.   Gastrointestinal: Negative.   Genitourinary: Negative.   Musculoskeletal: Positive for joint pain.  Skin: Negative.   Neurological: Positive for weakness.  Psychiatric/Behavioral: Negative.  Blood pressure 151/73, pulse 70, temperature 98.3 F (36.8 C), temperature source Oral, resp. rate 16, height _0  (1.651 m), weight 64.2 kg (141 lb 8.6 oz), SpO2 97 %. Physical Exam  Constitutional: She is oriented to person, place, and time. She appears well-developed and well-nourished.  HENT:  Head: Normocephalic and atraumatic.  Eyes: Conjunctivae and EOM are normal. Pupils are equal, round, and reactive to light.  Neck: Normal range of motion. Neck supple.  Cardiovascular: Normal rate and regular rhythm.   GI: Bowel sounds are normal.  Musculoskeletal: Normal range of motion.  Neurological: She is alert and oriented to person, place, and time.  Skin: Skin is warm and dry.  Psychiatric: She has a normal mood and affect. Her behavior is normal. Judgment and thought content normal.   Assessment/Plan: 1) Iron deficiency anemia with occasional rectal bleeding: will proceed with a EGD tomorrow as she has already had a colonoscopy  last year. Further recommendations will be made thereafter. 2) GERD on Pantaprazole.  3) HTN/Hyperlipidemia/CHF/Non-ischemic cardiomyopathy. 4) Type II DM.  5) OSA/Tobacco abuse. 6) Chronic kidney disease. 7) DDD.  Daltin Crist 10/18/2014, 4:35 PM

## 2014-10-18 NOTE — Progress Notes (Signed)
  Echocardiogram 2D Echocardiogram has been performed.  [name] FRANCES 10/18/2014, 10:42 AM

## 2014-10-19 DIAGNOSIS — I129 Hypertensive chronic kidney disease with stage 1 through stage 4 chronic kidney disease, or unspecified chronic kidney disease: Secondary | ICD-10-CM

## 2014-10-19 LAB — GLUCOSE, CAPILLARY
GLUCOSE-CAPILLARY: 313 mg/dL — AB (ref 70–99)
GLUCOSE-CAPILLARY: 234 mg/dL — AB (ref 70–99)
Glucose-Capillary: 342 mg/dL — ABNORMAL HIGH (ref 70–99)
Glucose-Capillary: 341 mg/dL — ABNORMAL HIGH (ref 70–99)
Glucose-Capillary: 325 mg/dL — ABNORMAL HIGH (ref 70–99)

## 2014-10-19 LAB — CBC
HCT: 21.1 % — ABNORMAL LOW (ref 36.0–46.0)
HCT: 22.5 % — ABNORMAL LOW (ref 36.0–46.0)
HEMOGLOBIN: 7.7 g/dL — AB (ref 12.0–15.0)
Hemoglobin: 7.3 g/dL — ABNORMAL LOW (ref 12.0–15.0)
MCH: 30.7 pg (ref 26.0–34.0)
MCH: 30.1 pg (ref 26.0–34.0)
MCHC: 34.6 g/dL (ref 30.0–36.0)
MCHC: 34.2 g/dL (ref 30.0–36.0)
MCV: 88.7 fL (ref 78.0–100.0)
MCV: 87.9 fL (ref 78.0–100.0)
PLATELETS: 170 10*3/uL (ref 150–400)
Platelets: 164 10*3/uL (ref 150–400)
RBC: 2.38 MIL/uL — AB (ref 3.87–5.11)
RBC: 2.56 MIL/uL — ABNORMAL LOW (ref 3.87–5.11)
RDW: 14.9 % (ref 11.5–15.5)
RDW: 15.1 % (ref 11.5–15.5)
WBC: 9.1 10*3/uL (ref 4.0–10.5)
WBC: 6.2 10*3/uL (ref 4.0–10.5)

## 2014-10-19 LAB — BASIC METABOLIC PANEL
ANION GAP: 9 (ref 5–15)
BUN: 54 mg/dL — AB (ref 6–23)
CHLORIDE: 103 mmol/L (ref 96–112)
CO2: 26 mmol/L (ref 19–32)
CREATININE: 2.1 mg/dL — AB (ref 0.50–1.10)
Calcium: 9.1 mg/dL (ref 8.4–10.5)
GFR, EST AFRICAN AMERICAN: 26 mL/min — AB (ref >90)
GFR, EST NON AFRICAN AMERICAN: 23 mL/min — AB (ref >90)
Glucose, Bld: 243 mg/dL — ABNORMAL HIGH (ref 70–99)
Potassium: 4.2 mmol/L (ref 3.5–5.1)
Sodium: 138 mmol/L (ref 135–145)

## 2014-10-19 LAB — PREPARE RBC (CROSSMATCH)

## 2014-10-19 LAB — PROTIME-INR
INR: 3.74 — AB (ref 0.00–1.49)
Prothrombin Time: 37.3 seconds — ABNORMAL HIGH (ref 11.6–15.2)

## 2014-10-19 LAB — TECHNOLOGIST SMEAR REVIEW

## 2014-10-19 LAB — THYROID PEROXIDASE ANTIBODY: Thyroperoxidase Ab SerPl-aCnc: 14 IU/mL (ref 0–34)

## 2014-10-19 LAB — OCCULT BLOOD X 1 CARD TO LAB, STOOL: FECAL OCCULT BLD: POSITIVE — AB

## 2014-10-19 MED ORDER — INSULIN ASPART 100 UNIT/ML ~~LOC~~ SOLN
0.0000 [IU] | Freq: Three times a day (TID) | SUBCUTANEOUS | Status: DC
Start: 2014-10-19 — End: 2014-10-26
  Administered 2014-10-19 (×2): 11 [IU] via SUBCUTANEOUS
  Administered 2014-10-20: 5 [IU] via SUBCUTANEOUS
  Administered 2014-10-20: 3 [IU] via SUBCUTANEOUS
  Administered 2014-10-20: 5 [IU] via SUBCUTANEOUS
  Administered 2014-10-21: 2 [IU] via SUBCUTANEOUS
  Administered 2014-10-21: 3 [IU] via SUBCUTANEOUS
  Administered 2014-10-22: 2 [IU] via SUBCUTANEOUS
  Administered 2014-10-22: 5 [IU] via SUBCUTANEOUS
  Administered 2014-10-23 (×2): 2 [IU] via SUBCUTANEOUS
  Administered 2014-10-25: 3 [IU] via SUBCUTANEOUS
  Administered 2014-10-25: 5 [IU] via SUBCUTANEOUS
  Administered 2014-10-26: 2 [IU] via SUBCUTANEOUS
  Administered 2014-10-26: 3 [IU] via SUBCUTANEOUS

## 2014-10-19 MED ORDER — SIMETHICONE 80 MG PO CHEW
80.0000 mg | CHEWABLE_TABLET | Freq: Four times a day (QID) | ORAL | Status: DC | PRN
  Administered 2014-10-19: 80 mg via ORAL
  Filled 2014-10-19: qty 1

## 2014-10-19 MED ORDER — SODIUM CHLORIDE 0.9 % IV SOLN: Freq: Once | INTRAVENOUS | Status: DC

## 2014-10-19 MED ORDER — INSULIN DETEMIR 100 UNIT/ML ~~LOC~~ SOLN
16.0000 [IU] | Freq: Every day | SUBCUTANEOUS | Status: DC
  Administered 2014-10-19: 16 [IU] via SUBCUTANEOUS
  Filled 2014-10-19 (×2): qty 0.16

## 2014-10-19 NOTE — Care Management Note (Signed)
    Page 1 of 1   10/23/2014     4:08:06 PM CARE MANAGEMENT NOTE 10/23/2014  Patient:  [name], [name]   Account Number:  [id number]  Date Initiated:  10/19/2014  Documentation initiated by:  GRAVES-BIGELOW,Dawnielle Christiana  Subjective/Objective Assessment:   Pt admitted for Symptomatic Anemia -hypotension. Admitted from cardiology office.     Action/Plan:   CM to monitor for disposition needs.   Anticipated DC Date:  10/21/2014   Anticipated DC Plan:  [place]  CM consult      Choice offered to / List presented to:  C-1 Patient        [place] arranged  [facility]      [place].   Status of service:  In process, will continue to follow Medicare Important Message given?  YES (If response is "NO", the following Medicare IM given date fields will be blank) Date Medicare IM given:  10/20/2014 Medicare IM given by:  GRAVES-BIGELOW,Mckaela Howley Date Additional Medicare IM given:  10/23/2014 Additional Medicare IM given by:  Elenor Quinones  Discharge Disposition:  [place]  Per UR Regulation:  Reviewed for med. necessity/level of care/duration of stay  If discussed at [place] of Stay Meetings, dates discussed:    Comments:  10/23/14 Elenor Quinones RN BSN [phone] Pt offered [facility] list for PT services, pt and husband chose [place].  CM contacted Miranda at [facility]; referral accepted.  CM informed Miranda that discharge orders are not written at of yet, advanced wll follow pt and determine when [facility] should begin.  NO additonal CM needs at this time.

## 2014-10-19 NOTE — Progress Notes (Signed)
PT Cancellation Note  Patient Details Name: [name] MRN: [id number] DOB: 1946-05-26   Cancelled Treatment:    Reason Eval/Treat Not Completed: Patient at procedure or test/unavailable (Vascular lab in with pt. Will check back as able.)   Denice Paradise 10/19/2014, 12:14 PM  Ankita Newcomer,PT Acute Rehabilitation [phone] (pager)

## 2014-10-19 NOTE — Progress Notes (Addendum)
Initial Nutrition Assessment  DOCUMENTATION CODES:  Not applicable  INTERVENTION:  Ensure Enlive (each supplement provides 350kcal and 20 grams of protein)   NUTRITION DIAGNOSIS:  Inadequate oral intake related to other (see comment) (poor appetite) as evidenced by other (see comment) (husband report).  GOAL:  Patient will meet greater than or equal to 90% of their needs  MONITOR:  PO intake, Supplement acceptance, I & O's, Labs, Weight trends  REASON FOR ASSESSMENT:  Malnutrition Screening Tool    ASSESSMENT: 69 y.o. Female with symptomatic anemia, type 2 diabetes, NICM with EF 15-20% with ICD placement, PAF presented from her cardiologist's office with hypotension. She had been feeling dizzy for the past 3 weeks, and family mentions that she would transiently fall asleep while sitting, during conversations.   RD spoke with patient's husband at bedside.  Husband reports patient hasn't been eating well PTA.  He states he would have to "force" her to eat.  PO intake variable at 50-90% per flowsheet records.  Weight has been stable.  Likes strawberry [employer].  Orders currently in place.  Height:  Ht Readings from Last 1 Encounters:  10/19/14 5\' 5"  (1.651 m)    Weight:  Wt Readings from Last 1 Encounters:  10/19/14 155 lb 10.3 oz (70.6 kg)    Ideal Body Weight:  56.8 kg  Wt Readings from Last 10 Encounters:  10/19/14 155 lb 10.3 oz (70.6 kg)  04/18/14 149 lb (67.586 kg)  03/14/14 153 lb (69.4 kg)  02/08/14 158 lb (71.668 kg)  01/10/14 158 lb 4.6 oz (71.8 kg)  10/11/13 155 lb (70.308 kg)  09/30/13 161 lb 1.6 oz (73.074 kg)  06/30/13 153 lb (69.4 kg)  06/29/13 153 lb 4 oz (69.514 kg)  06/22/13 157 lb 13.6 oz (71.6 kg)    BMI:  Body mass index is 25.9 kg/(m^2).  Estimated Nutritional Needs:  Kcal:  3704-8889  Protein:  80-90 gm  Fluid:  1.7-1.9 L  Skin:  Reviewed, no issues  Diet Order:  DIET SOFT Room service appropriate?: Yes; Fluid  consistency:: Thin  EDUCATION NEEDS:  No education needs identified at this time   Intake/Output Summary (Last 24 hours) at 10/19/14 1448 Last data filed at 10/19/14 1200  Gross per 24 hour  Intake   1145 ml  Output      0 ml  Net   1145 ml    Last BM:  unknown  Arthur Holms, RD, LDN Pager #: [phone] After-Hours Pager #: [phone]

## 2014-10-19 NOTE — Progress Notes (Addendum)
Subjective:  Patient states that she is feeling a little better after her blood transfusion yesterday. She still states that she has some underlying fatigue. Otherwise, patient not reporting any complaints.  Objective: Vital signs in last 24 hours: Filed Vitals:   10/18/14 2000 10/18/14 2357 10/19/14 0400 10/19/14 0500  BP: 166/74 136/60 96/44   Pulse: 69 69 73   Temp: 98.2 F (36.8 C) 98.6 F (37 C) 98.8 F (37.1 C)   TempSrc: Oral Oral Oral   Resp: '12 18 16   ' Height:      Weight:    155 lb 10.3 oz (70.6 kg)  SpO2: 100% 95% 95%    Weight change: 2 lb 10.3 oz (1.2 kg)  Intake/Output Summary (Last 24 hours) at 10/19/14 0828 Last data filed at 10/19/14 0700  Gross per 24 hour  Intake    720 ml  Output      0 ml  Net    720 ml    General: resting in bed HEENT: PERRL, EOMI, no scleral icterus, conjunctival pallor Cardiac: RRR, no rubs or gallops, 2/6 systolic murmur Pulm: clear to auscultation bilaterally, moving normal volumes of air Abd: soft, nontender, nondistended, BS present Ext: warm and well perfused, no pedal edema Neuro: alert and oriented X3, cranial nerves II-XII grossly intact Skin: no rashes or lesions noted Psych: appropriate affect  Lab Results: Basic Metabolic Panel:  Recent Labs Lab 10/18/14 0304 10/19/14 0253  NA 135 138  K 4.0 4.2  CL 99 103  CO2 28 26  GLUCOSE 262* 243*  BUN 59* 54*  CREATININE 2.46* 2.10*  CALCIUM 8.9 9.1   Liver Function Tests:  Recent Labs Lab 10/17/14 0915 10/17/14 1000  AST 14 19  ALT 11 13  ALKPHOS 71 73  BILITOT 0.5 0.8  PROT 6.8 7.0  ALBUMIN 3.7 3.9   No results for input(s): LIPASE, AMYLASE in the last 168 hours. No results for input(s): AMMONIA in the last 168 hours. CBC:  Recent Labs Lab 10/17/14 1000  10/18/14 1139 10/19/14 0253  WBC 9.0  < > 5.1 9.1  NEUTROABS 7.5  --   --   --   HGB 6.7*  < > 7.4* 7.3*  HCT 19.8*  < > 22.1* 21.1*  MCV 90.0  < > 89.5 88.7  PLT 200  < > 159 170  < > =  values in this interval not displayed. Cardiac Enzymes:  Recent Labs Lab 10/17/14 1000  TROPONINI 0.03   BNP: No results for input(s): PROBNP in the last 168 hours. D-Dimer: No results for input(s): DDIMER in the last 168 hours. CBG:  Recent Labs Lab 10/17/14 2232 10/18/14 0515 10/18/14 0725 10/18/14 1252 10/18/14 1627 10/18/14 2118  GLUCAP 311* 230* 305* 375* 390* 352*   Hemoglobin A1C:  Recent Labs Lab 10/17/14 1909  HGBA1C 9.6*   Fasting Lipid Panel: No results for input(s): CHOL, HDL, LDLCALC, TRIG, CHOLHDL, LDLDIRECT in the last 168 hours. Thyroid Function Tests:  Recent Labs Lab 10/17/14 0915  TSH 19.740*  FREET4 0.83  T3FREE 2.3   Coagulation:  Recent Labs Lab 10/18/14 0850 10/19/14 0253  LABPROT 35.3* 37.3*  INR 3.49* 3.74*   Anemia Panel:  Recent Labs Lab 10/18/14 0850  VITAMINB12 511  FOLATE 8.4  FERRITIN 18  TIBC 279  IRON 86  RETICCTPCT 2.1   Urine Drug Screen: Drugs of Abuse  No results found for: LABOPIA, COCAINSCRNUR, LABBENZ, AMPHETMU, THCU, LABBARB  Alcohol Level: No results for input(s): [facility]  in the last 168 hours. Urinalysis:  Recent Labs Lab 10/17/14 1050  COLORURINE YELLOW  LABSPEC 1.012  PHURINE 6.5  GLUCOSEU >1000*  HGBUR NEGATIVE  BILIRUBINUR NEGATIVE  KETONESUR NEGATIVE  PROTEINUR NEGATIVE  UROBILINOGEN 0.2  NITRITE NEGATIVE  LEUKOCYTESUR NEGATIVE   Micro Results: Recent Results (from the past 240 hour(s))  MRSA PCR Screening     Status: Abnormal   Collection Time: 10/17/14  6:09 PM  Result Value Ref Range Status   MRSA by PCR POSITIVE (A) NEGATIVE Final    Comment:        The GeneXpert MRSA Assay (FDA approved for NASAL specimens only), is one component of a comprehensive MRSA colonization surveillance program. It is not intended to diagnose MRSA infection nor to guide or monitor treatment for MRSA infections. RESULT CALLED TO, READ BACK BY AND VERIFIED WITH: Carley Hammed RN [id number] 10/17/14 A  BROWNING    Studies/Results: Dg Chest 2 View  10/17/2014   CLINICAL DATA:  Dizziness, nausea 2 days  EXAM: CHEST  2 VIEW  COMPARISON:  01/10/2014  FINDINGS: There is no focal parenchymal opacity, pleural effusion, or pneumothorax. The heart and mediastinal contours are stable. There is a 3 lead AICD.  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  IMPRESSION: No active cardiopulmonary disease.   Electronically Signed   By: [name]   On: 10/17/2014 11:41   Medications: I have reviewed the patient's current medications. Scheduled Meds: . sodium chloride   Intravenous Once  . amiodarone  200 mg Oral Daily  . carvedilol  3.125 mg Oral BID WC  . Chlorhexidine Gluconate Cloth  6 each Topical Q0600  . digoxin  0.0625 mg Oral QODAY  . feeding supplement (ENSURE ENLIVE)  237 mL Oral BID BM  . insulin aspart  0-5 Units Subcutaneous QHS  . insulin aspart  0-9 Units Subcutaneous TID WC  . insulin detemir  10 Units Subcutaneous Daily  . levothyroxine  25 mcg Oral QAC breakfast  . mupirocin ointment  1 application Nasal BID  . pantoprazole (PROTONIX) IV  40 mg Intravenous Q24H  . polyethylene glycol  17 g Oral Daily  . pravastatin  80 mg Oral QHS   Continuous Infusions: . sodium chloride     PRN Meds:.acetaminophen **OR** acetaminophen, ondansetron **OR** ondansetron (ZOFRAN) IV Assessment/Plan: Principal Problem:   Hypotension Active Problems:   Cardiomyopathy, nonischemic   Diabetes mellitus, type 2   Anemia, normocytic normochromic   CHF (congestive heart failure)   Patient is a 69 year old with a history of symptomatic anemia, chronic kidney disease, type 2 diabetes, nonischemic cardiomyopathy status post ICD placement, paroxysmal atrial fibrillation who is admitted for symptomatic anemia and hypotension.  Symptomatic normocytic anemia: Patient remains fatigued. Patient's hemoglobin has remained stable at 7.4 from 7.6 yesterday. Patient's presentation is concerning for a  hypoproliferative disorder given a normocytic anemia and reduced reticulocyte index of 0.6. No evidence of hemolysis given normal bilirubin, LDH, and haptoglobin (although latter two taken after blood transfusion). Coombs negative. Peripheral smear only remarkable for polychromasia. Hypothyroidism may be the cause of this but other etiologies such as multiple myeloma and other malignancies may be considered. Additionally, patient may have some source of GI bleeding given positive FOBT, though her hemoglobin has remained relatively stable and ferritin was not significantly low. -Administer another unit of packed red blood cells today.  -Appreciate cardiology and GI assistance. -Patient plan for EGD today. -Multiple myeloma panel pending -May need bone marrow biopsy if workup continues to be  negative.  Nonischemic cardiomyopathy: Patient remains euvolemic. Repeat echocardiogram from this admission showing a stable ejection fraction of 15-20% with grade 2 diastolic dysfunction. Status post placement of a [employer] CRT-D device placed in August 2014. Interrogation showing no evidence of atrial fibrillation or ventricular tachycardia.  At home, patient is on Coreg 12.5 mg twice a day, lisinopril 2.5 mg daily. Patient has not an LVAD candidate. -Appreciate cardiology recommendations -Continue Coreg 3.125 mg twice a day. -Continue to hold lisinopril in the setting of elevated creatinine. -Hold Lasix today given euvolemia. -Continue digoxin to every other day dosing given a level of 1.1 yesterday. -Hold home Coreg and lisinopril at this point given low blood pressures. -Repeat echocardiogram -No signs of fluid overload status post blood transfusion  Acute? on chronic kidney disease: Patient's creatinine has trended down to 2.1 from 2.46. No concurrent electrolyte abnormalities. -Continue to monitor. -Holding lisinopril. -Holding Lasix -Multiple myeloma panel as above  Type 2 diabetes: Patient's blood  glucose levels remain elevated between 243 and 352. -Continue with sliding scale insulin -Increase levemir to 16 units daily given procedures today. Consider up titration afterwards.  Hypothyroidism: TSH of 19.7. Free T4 within normal limits of 0.83 although patient may be symptomatic from her thyroid state which argues against subclinical hypothyroidism. At home, patient is not on any thyroid supplementation.  -Synthroid 25 g daily  Paroxysmal atrial fibrillation: Last cardioverted in December 2014. Interrogation in cardiologist clinic unremarkable for any atrial fibrillation or ventricular tachycardia. At home, patient is on amiodarone 200 mg daily, carvedilol 12.5 mg twice a day, digoxin 0.5 mg daily, and Coumadin. Digoxin level within normal limits today. -Continue home amiodarone and digoxin and reduced dose as above. -Continue home carvedilol 3.125 mg twice a day. -Hold home Coumadin pending GI workup.  Dispo: Disposition is deferred at this time, awaiting improvement of current medical problems. Anticipated discharge in approximately 3 day(s).   The patient does have a current PCP Lemmie Evens, MD) and does need an [facility] hospital follow-up appointment after discharge.  The patient does not have transportation limitations that hinder transportation to clinic appointments.   LOS: 2 days   Services Needed at time of discharge: Y = Yes, Blank = No PT:   OT:   RN:   Equipment:   Other:    Luan Moore, MD 10/19/2014, 8:28 AM

## 2014-10-19 NOTE — Progress Notes (Signed)
  PROGRESS NOTE MEDICINE TEACHING ATTENDING   Day 2 of stay Patient name: [name]   Medical record number: [id number] Date of birth: 1945-07-18   Met patient and her husband today. She feels unwell and weak.   Blood pressure 116/54, pulse 69, temperature 98.6 F (37 C), temperature source Oral, resp. rate 23, height _0  (1.651 m), weight 155 lb 10.3 oz (70.6 kg), SpO2 96 %. The patient is alert and oriented, comfortable, in no acute distress. PERRL, EOMI. Heart exhibits regular rate and rhythm, systolic murmur. Lungs are clear to auscultation. Abdomen is soft and non-tender. Right calf appears larged than left, good pedal pulses. There are no gross focal neurological deficits apparent.   Assessment/Plan  Symptomatic anemia, likely multifactorial etiology - Yesterday Cardiology directly called GI services for consult and the patient is going to be an getting EGD. To me, the low retic count goes against active bleeding (FOBT + is likely due to hemorrhoids), however I will now wait to see the EGD results. We have asked Dr Beryle Beams to consult formally today. Meanwhile the patient will get 1 unit of PRBC. A myeloma panel is pending. Ferritin is low (ish) at 18 thus there could be a component of IDA here, however that cannot explain the entire picture with low retic count, thus hypothyroidism could be definitely contributing.   NICM - We will continue to follow cardiology recs for this.  I have discussed the care of this patient with my IM team residents. Please see the resident note for details.  [place], [place] 10/19/2014, 1:51 PM.

## 2014-10-19 NOTE — Progress Notes (Signed)
*  Preliminary Results* Bilateral lower extremity venous duplex completed. Visualized veins of bilateral lower extremities are negative for deep vein thrombosis. There is no evidence of Baker's cyst bilaterally.  10/19/2014  Maudry Mayhew, RVT, RDCS, RDMS

## 2014-10-19 NOTE — Progress Notes (Signed)
Inpatient Diabetes Program Recommendations  AACE/ADA: New Consensus Statement on Inpatient Glycemic Control (2013)  Target Ranges:  Prepandial:   less than 140 mg/dL      Peak postprandial:   less than 180 mg/dL (1-2 hours)      Critically ill patients:  140 - 180 mg/dL   Results for KAMIAH, FITE (MRN [id number]) as of 10/19/2014 08:44  Ref. Range 10/18/2014 07:25 10/18/2014 12:52 10/18/2014 16:27 10/18/2014 21:18 10/19/2014 08:38  Glucose-Capillary Latest Ref Range: 70-99 mg/dL 305 (H) 375 (H) 390 (H) 352 (H) 313 (H)    Reason for Visit: Anemia  Diabetes history: DM 2 Outpatient Diabetes medications: None Current orders for Inpatient glycemic control: Novolog 0-9 units TID, Levemir 10 units Daily  A1c 9.6% on 10/17/14 not accurate due to anemia  Inpatient Diabetes Program Recommendations Insulin - Basal: Glucose is 313 mg/dl this am. Please consider increasing basal insulin to Levemir 20 units Daily.   Thanks,  Tama Headings RN, MSN, [facility] Inpatient Diabetes Coordinator Team Pager [phone]

## 2014-10-19 NOTE — Consult Note (Signed)
Referring MD: Dr Ellwood Dense  PCP:  Robert Bellow, MD   Reason for Referral: Hematology consultation to evaluate normochromic anemia   Chief Complaint  Patient presents with  . Hypotension    HPI:  Pleasant 69 year old woman with an advanced nonischemic cardiomyopathy. She has an AICD in place. She has chronic systolic heart failure with estimated ejection fraction 15-20 percent. She has stage III chronic renal insufficiency with creatinine in the range of 2.1-2.5 and estimated GFR of 18-23 mL/m. She has had type 2 diabetes for about 9 years.  She has had a chronic, normochromic anemia for at least the last 2 years with hemoglobin in the 9-10 gram range. She occasionally sees some blood on the toilet tissue when she strains at stool. She denies any melena or gross hematochezia. She has known hemorrhoids. She had upper and lower endoscopy in December 2014 when she presented with nausea vomiting diarrhea and anemia with findings of adenomatous polyps of the colon without any dysplasia or malignancy noted. Diverticulosis noted. Upper endoscopy was normal. She is a cigarette smoker who stopped about 9 months ago. She states she never smoked heavily and usually just puffed on the cigarettes without inhaling. Chest radiograph this admission normal except for the pacemaker. She had a CT scan of the abdomen and pelvis in January 2015 which showed no mass, organomegaly, or lymphadenopathy. Benign right adrenal nodule and a small lesion in the right hepatic lobe "likely benign". Diverticulosis noted. She has degenerative arthritis but no history of inflammatory arthritis and no signs or symptoms of a collagen vascular disorder. She had a hysterectomy in her 70s. She has no current vaginal bleeding. She denies any hematuria.  Hemoglobin first noted to be down from her baseline in December 2015 when it fell to 7.9. She presented at the time of the current admission on 10/17/2014 with progressive  weakness and anorexia but denied any weight loss. Hemoglobin found to be 6.4 g hematocrit 19.1% MCV 89 white count 7900 platelet count 185,000. She states that her baseline weight is 155 pounds and has not changed over the last year as far she knows. Weight on admission was 153 pounds.  Laboratory evaluation during this admission:  Urinalysis without blood or protein Serum total protein 7.0, albumin 3.6, total bilirubin 1.2, repeat 0.6. LDH 157, reticulocyte count 2.1% with concomitant hemoglobin of 7.6, haptoglobin 144 BUN 59, creatinine 2.5, estimated GFR 22 Iron 86, TIBC 279, percent saturation 31, ferritin 18  Family history of cancer in one brother who died in his 64s type unknown to patient. She feels he died from a heart attack and not the cancer. One brother and one sister surviving. Both have coronary disease. She has 6 healthy children.      Past Medical History  Diagnosis Date  . Cardiomyopathy, nonischemic     a. 1999 nl cath;  b. 12/05 [employer] [place];  c. 10/2005 ICD extraction 2/2 enterococcus bacteremia and Veg on RV lead;  c. 05/2008 low risk Myoview (scarring w/ some evidence of inf ischemia);  d. 11/2012 Echo: EF 15-20%;  e. 01/2013 s/p MDT [place] CRT D, ser # [id number];  f. 05/2013 Echo: EF 15%.  . Enterococcal infection     a. 10/2005 - AICD-explanted  . Pulmonary embolism     a. 07/2005 after total right hip arthroplasty  . Hilar density     a. infrahilar mass/adenopathy on CT scan 5/07; subsequently  resolved  . LBBB (left bundle branch block)   . GERD (gastroesophageal  reflux disease)   . Hyperlipidemia   . Hypertension   . Tobacco abuse     a. discontinued in 1997, and then resumed  . Urinary incontinence   . Anemia     a. mild/chronic  . Villous adenoma of colon     a. tubovillous adenomatous polyp with focal high grade dysplasia; presented with hematochezia - followed by Dr. Laural Golden.  . CKD (chronic kidney disease), stage III     creatinin-1.44 in 1/09; 1.51  in 1/10  . Implantable cardioverter-defibrillator-CRT- Mdt     a.  01/2013 s/p MDT [place] CRT D, ser # [id number]  . Chronic systolic CHF (congestive heart failure)     a. 11/2012 Echo: EF 15-20%;  b. 05/2013 TEE EF 15%.  . Pneumonia 07/2005  . Obstructive sleep apnea     a. mild-did not tolerate CPAP (01/24/2013)  . Type II diabetes mellitus   . History of blood transfusion   . Degenerative joint disease     of knees, shoulder, and hips  . PAF (paroxysmal atrial fibrillation)     a. 07/2012 s/p TEE/DCCV;  b. chronic coumadin;  c. 05/2013 Recurrent Afib->TEE/DCCV and amio initiation.  : No history of hepatitis, yellow jaundice, or tuberculosis. She denies prior MI. Denies prior ulcers.   Past Surgical History  Procedure Laterality Date  . Pacemaker removal  11/18/05    Enterococcal infection  . Total hip arthroplasty Right 07/2005  . Knee arthroscopy Right 1980's?  . Colonoscopy w/ polypectomy  2009  . Cardioversion N/A 08/20/2012    Procedure: TEE GUIDED CARDIOVERSION;  Surgeon: Yehuda Savannah, MD;  Location: [facility] OR[facility];  Service: Cardiovascular;  Laterality: N/A;  To be done @ bedside  . Tee without cardioversion N/A 08/20/2012    Procedure: TRANSESOPHAGEAL ECHOCARDIOGRAM (TEE);  Surgeon: Yehuda Savannah, MD;  Location: [facility];  Service: Cardiovascular;  Laterality: N/A;  . Bi-ventricular implantable cardioverter defibrillator  (crt-d)  01/24/2013  . A-v cardiac pacemaker insertion  12/05    Biventricular pacemaker/AICD  . Abdominal hysterectomy  1990/92    Initial partial hysterectomy followed by BSO  . Tubal ligation  1980's  . Cardiac catheterization    . Colonoscopy with esophagogastroduodenoscopy (egd) N/A 06/10/2013    Procedure: COLONOSCOPY WITH ESOPHAGOGASTRODUODENOSCOPY (EGD);  Surgeon: Rogene Houston, MD;  Location: [facility] ENDO SUITE;  Service: Endoscopy;  Laterality: N/A;  925  . Tee without cardioversion N/A 06/20/2013    Procedure: TRANSESOPHAGEAL ECHOCARDIOGRAM (TEE);   Surgeon: Dorothy Spark, MD;  Location: [facility];  Service: Cardiovascular;  Laterality: N/A;  . Cardioversion N/A 06/20/2013    Procedure: CARDIOVERSION;  Surgeon: Dorothy Spark, MD;  Location: [facility] ENDOSCOPY;  Service: Cardiovascular;  Laterality: N/A;  . Bi-ventricular implantable cardioverter defibrillator N/A 01/24/2013    Procedure: BI-VENTRICULAR IMPLANTABLE CARDIOVERTER DEFIBRILLATOR  (CRT-D);  Surgeon: Evans Lance, MD;  Location: [facility] CATH LAB;  Service: Cardiovascular;  Laterality: N/A;  . Right heart catheterization N/A 04/18/2014    Procedure: RIGHT HEART CATH;  Surgeon: Larey Dresser, MD;  Location: [facility] CATH LAB;  Service: Cardiovascular;  Laterality: N/A;  :  . sodium chloride   Intravenous Once  . amiodarone  200 mg Oral Daily  . carvedilol  3.125 mg Oral BID WC  . Chlorhexidine Gluconate Cloth  6 each Topical Q0600  . digoxin  0.0625 mg Oral QODAY  . feeding supplement (ENSURE ENLIVE)  237 mL Oral BID BM  . insulin aspart  0-15 Units Subcutaneous TID WC  .  insulin aspart  0-5 Units Subcutaneous QHS  . insulin detemir  16 Units Subcutaneous QHS  . levothyroxine  25 mcg Oral QAC breakfast  . mupirocin ointment  1 application Nasal BID  . pantoprazole (PROTONIX) IV  40 mg Intravenous Q24H  . polyethylene glycol  17 g Oral Daily  . pravastatin  80 mg Oral QHS  :  No Known Allergies:  Family History  Problem Relation Age of Onset  . Hypertension Mother   . Diabetes Mother   . Coronary artery disease Father   . Diabetes Brother   . Hypertension Brother   . Lung cancer Brother   . Arthritis Other   . Diabetes Other   . Heart disease Other     female < 55  :  History   Social History  . Marital Status: Married    Spouse Name: N/A  . Number of Children: 6  . Years of Education: N/A   Occupational History  .  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 Smoking status: Former Smoker -- 12 years    Types:  Cigarettes  . Smokeless tobacco: Never Used     Comment: 05/2013: Smokes an occasional cigarette.  Says that she doesn't inhale.  . Alcohol Use: No  . Drug Use: No  . Sexual Activity: Not Currently    Birth Control/ Protection: None   Other Topics Concern  . Not on file   Social History Narrative   Married, lives in [place] with spouse. Retired Secretary/administrator.   :  ROS: See history of present illness. She denies any headache or change in vision. She does admit to intermittent mild dysphagia for both liquids and solids. No active chest pain. Occasional chest pressure. She has been intermittently constipated. (Of note elevated TSH  during this admission). She denies any paresthesias.   Vitals: Filed Vitals:   10/19/14 1259  BP: 116/54  Pulse: 69  Temp: 98.6 F (37 C)  Resp:     PHYSICAL EXAM: General appearance: Thin, chronically ill-appearing woman HEENT: Pharynx no erythema or exudate. No mass, neck full range of motion. Lymph Nodes: No cervical, supraclavicular, axillary, or inguinal adenopathy Resp: Lungs are clear to auscultation resident to percussion throughout Cardio: Regular cardiac rhythm without murmur gallop or rub. Pacemaker battery-powered subcutaneous tissues right subclavian position Vascular: Carotid pulses 2+ no bruits. Dorsalis pedis pulses 2+ symmetric Breasts: No breast masses GI: Abdomen is soft, nontender, no mass, no organomegaly GU: Extremities: No edema, no calf tenderness no cyanosis of the fingernails. Toenails with nail polish on. Advanced degenerative changes of her knees with hyperostosis.  Neurologic:  She is alert and oriented, cranial nerves grossly normal, PERRLA, full extraocular movements motor strength 5 over 5, reflexes absent symmetric at the knees, 1+ symmetric at the biceps. Skin: No rash or ecchymosis.  Labs:   Recent Labs  10/18/14 1139 10/19/14 0253  WBC 5.1 9.1  HGB 7.4* 7.3*  HCT 22.1* 21.1*  PLT 159 170    Recent  Labs  10/18/14 0304 10/19/14 0253  NA 135 138  K 4.0 4.2  CL 99 103  CO2 28 26  GLUCOSE 262* 243*  BUN 59* 54*  CREATININE 2.46* 2.10*  CALCIUM 8.9 9.1    Blood smear review: Normochromic normocytic red cells. No spherocytes, schistocytes, or inclusions. No teardrop cells. No polychromasia. Some of the neutrophils have nonspecific vacuoles but overall normal in granulation and lobation. Mature lymphocytes with  occasional reactive lymphocytes. Single plasmacytoid lymphocyte noted. Nonspecific. Platelets appear normal in number and morphology.   Images Studies/Results: See discussion above   Assessment: Principal Problem:   Hypotension Active Problems:   Cardiomyopathy, nonischemic   Diabetes mellitus, type 2   Anemia, normocytic normochromic   CHF (congestive heart failure)   Impression: Acute on chronic normochromic anemia I believe her anemia is multifactorial with contributions from chronic disease including advanced congestive cardiomyopathy , significant renal insufficiency with estimated GFR only 20 mL/m., and significant hypothyroidism. There is no evidence for a hemolytic process.  There is no clinical or radiographic evidence for malignancy at this time.  At the end of the day, I think that we are going to find that her anemia is primarily due to her renal insufficiency. Low-grade myelodysplastic syndrome and/or multiple myeloma not excluded at this time.  Recommendation:  #1. Screen for myeloma with serum immunoglobulins, immunofixation electrophoresis, and serum free kappa and lambda light chains #2. 24-hour urine for creatinine clearance, total protein, and immunofixation electrophoresis.  I do not see the value of repeating endoscopy procedures at this time.  I would consider a bone marrow aspiration and biopsy if the above evaluation is unrevealing. She would benefit from an erythrocyte stimulating agent if her hemoglobin remains less than 10 g. I would also  give a dose of IV iron if we start growth factor since her ferritin is low normal.  Thank you for this consultation     Murriel Hopper, MD, [place]  Hematology-Oncology/Internal Medicine  10/19/2014, 1:57 PM

## 2014-10-19 NOTE — Progress Notes (Signed)
Patient ID: [name], female   DOB: 12-16-1945, 69 y.o.   MRN: [id number]   SUBJECTIVE: Patient was admitted with profound anemia (hgb 6.4), weakness, and hypotension.  She has received 2 units PRBCs, hgb to 7.3 today. Yesterday diuretics held. Weight up but doubt accurate.   Denies SOB   Creatinine 2.46>2.1  INR 3.7  ECHO EF 15-20% Grade II D, mild MR, mild to moderate AI, RV normal.   Scheduled Meds: . amiodarone  200 mg Oral Daily  . carvedilol  3.125 mg Oral BID WC  . Chlorhexidine Gluconate Cloth  6 each Topical Q0600  . digoxin  0.0625 mg Oral QODAY  . feeding supplement (ENSURE ENLIVE)  237 mL Oral BID BM  . insulin aspart  0-5 Units Subcutaneous QHS  . insulin aspart  0-9 Units Subcutaneous TID WC  . insulin detemir  10 Units Subcutaneous Daily  . levothyroxine  25 mcg Oral QAC breakfast  . mupirocin ointment  1 application Nasal BID  . pantoprazole (PROTONIX) IV  40 mg Intravenous Q24H  . polyethylene glycol  17 g Oral Daily  . pravastatin  80 mg Oral QHS   Continuous Infusions: . sodium chloride     PRN Meds:.acetaminophen **OR** acetaminophen, ondansetron **OR** ondansetron (ZOFRAN) IV   Filed Vitals:   10/18/14 2000 10/18/14 2357 10/19/14 0400 10/19/14 0500  BP: 166/74 136/60 96/44   Pulse: 69 69 73   Temp: 98.2 F (36.8 C) 98.6 F (37 C) 98.8 F (37.1 C)   TempSrc: Oral Oral Oral   Resp: 12 18 16    Height:      Weight:    155 lb 10.3 oz (70.6 kg)  SpO2: 100% 95% 95%     Intake/Output Summary (Last 24 hours) at 10/19/14 0723 Last data filed at 10/19/14 0700  Gross per 24 hour  Intake    720 ml  Output      0 ml  Net    720 ml    LABS: Basic Metabolic Panel:  Recent Labs  10/18/14 0304 10/19/14 0253  NA 135 138  K 4.0 4.2  CL 99 103  CO2 28 26  GLUCOSE 262* 243*  BUN 59* 54*  CREATININE 2.46* 2.10*  CALCIUM 8.9 9.1   Liver Function Tests:  Recent Labs  10/17/14 0915 10/17/14 1000  AST 14 19  ALT 11 13  ALKPHOS 71 73    BILITOT 0.5 0.8  PROT 6.8 7.0  ALBUMIN 3.7 3.9   No results for input(s): LIPASE, AMYLASE in the last 72 hours. CBC:  Recent Labs  10/17/14 1000  10/18/14 1139 10/19/14 0253  WBC 9.0  < > 5.1 9.1  NEUTROABS 7.5  --   --   --   HGB 6.7*  < > 7.4* 7.3*  HCT 19.8*  < > 22.1* 21.1*  MCV 90.0  < > 89.5 88.7  PLT 200  < > 159 170  < > = values in this interval not displayed. Cardiac Enzymes:  Recent Labs  10/17/14 1000  TROPONINI 0.03   BNP: Invalid input(s): POCBNP D-Dimer: No results for input(s): DDIMER in the last 72 hours. Hemoglobin A1C:  Recent Labs  10/17/14 1909  HGBA1C 9.6*   Fasting Lipid Panel: No results for input(s): CHOL, HDL, LDLCALC, TRIG, CHOLHDL, LDLDIRECT in the last 72 hours. Thyroid Function Tests:  Recent Labs  10/17/14 0915  TSH 19.740*  T3FREE 2.3   Anemia Panel:  Recent Labs  10/18/14 0850  VITAMINB12 511  FOLATE 8.4  FERRITIN 18  TIBC 279  IRON 86  RETICCTPCT 2.1    RADIOLOGY: Dg Chest 2 View  10/17/2014   CLINICAL DATA:  Dizziness, nausea 2 days  EXAM: CHEST  2 VIEW  COMPARISON:  01/10/2014  FINDINGS: There is no focal parenchymal opacity, pleural effusion, or pneumothorax. The heart and mediastinal contours are stable. There is a 3 lead AICD.  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  IMPRESSION: No active cardiopulmonary disease.   Electronically Signed   By: [name]   On: 10/17/2014 11:41    PHYSICAL EXAM General: NAD Neck: No JVD, no thyromegaly or thyroid nodule.  Lungs: Clear to auscultation bilaterally with normal respiratory effort. CV: Nondisplaced PMI.  Heart regular S1/S2, no S3/S4, 2/6 HSM LLSB/apex.  No peripheral edema.  No carotid bruit.  Normal pedal pulses.  Abdomen: Soft, nontender, no hepatosplenomegaly, no distention.  Neurologic: Alert and oriented x 3.  Psych: Normal affect. Extremities: No clubbing or cyanosis.   TELEMETRY: Reviewed telemetry pt in A-BiV paced  ASSESSMENT AND  PLAN: 69 yo with nonischemic CMP/chronic systolic CHF, paroxysmal atrial fibrillation, CKD, and history of anemia presented with profound anemia, weakness and hypotension.  1. Chronic systolic CHF: Patient has a nonischemic cardiomyopathy. Echo EF 15-20% Grade II DD. NYHA class III symptoms. RHC (10/15) showed low filling pressures and normal cardiac output.  She is unable to do CPX testing and would not be LVAD candidate.  Volume status low. Continue to hold diuretics and lisinopril.  - Continue Coreg 3.125 mg bid.  - Stay off lisinopril with elevated creatinine. - Digoxin every other day with level 1.1.  2. Atrial fibrillation: Paroxysmal. She is a-paced currently.  - She will continue amiodarone for now. TSH high, Levoxyl begun. - She has been on coumadin. INR today 3.7 .  Hold coumadin for now.  3. AKI on CKD: Follow closely. Stopping lisinopril and holding Lasix for now.  4. Anemia: She had EGD and c-scope in 12/14. In 12/15, she had transfusion but apparently no-showed for her GI workup.GI consult reviewed with plans for EGD. INR 3.7 today. FOBT positive.   CLEGG,AMY NP-C  10/19/2014 7:23 AM   Patient seen with NP, agree with the above note.  She will get another unit PRBCs today.  She will go for EGD.  We will follow tomorrow, continue to hold Lasix/lisinopril, creatinine improving.    Loralie Champagne 10/19/2014 8:00 AM

## 2014-10-19 NOTE — Progress Notes (Signed)
UR Completed Deola Rewis Graves-Bigelow, RN,BSN [phone]  

## 2014-10-20 ENCOUNTER — Encounter (HOSPITAL_COMMUNITY)
Admission: EM | Disposition: A | Discharge status: Home Health | Payer: Self-pay | Source: Home / Self Care | Attending: Internal Medicine

## 2014-10-20 DIAGNOSIS — I428 Other cardiomyopathies: Secondary | ICD-10-CM

## 2014-10-20 DIAGNOSIS — Z794 Long term (current) use of insulin: Secondary | ICD-10-CM

## 2014-10-20 LAB — MULTIPLE MYELOMA PANEL, SERUM
ALBUMIN SERPL ELPH-MCNC: 3 g/dL — AB (ref 3.2–5.6)
ALPHA 1: 0.2 g/dL (ref 0.1–0.4)
Albumin/Glob SerPl: 1.3 (ref 0.7–2.0)
Alpha2 Glob SerPl Elph-Mcnc: 0.6 g/dL (ref 0.4–1.2)
B-GLOBULIN SERPL ELPH-MCNC: 0.8 g/dL (ref 0.6–1.3)
GAMMA GLOB SERPL ELPH-MCNC: 0.9 g/dL (ref 0.5–1.6)
GLOBULIN, TOTAL: 2.5 g/dL (ref 2.0–4.5)
IGM, SERUM: 113 mg/dL (ref 26–217)
IgA: 181 mg/dL (ref 87–352)
IgG (Immunoglobin G), Serum: 892 mg/dL (ref 700–1600)
TOTAL PROTEIN ELP: 5.5 g/dL — AB (ref 6.0–8.5)

## 2014-10-20 LAB — TYPE AND SCREEN
ABO/RH(D): A NEG
ANTIBODY SCREEN: NEGATIVE
Unit division: 0
Unit division: 0
Unit division: 0

## 2014-10-20 LAB — CREATININE CLEARANCE, URINE, 24 HOUR
Collection Interval-CRCL: 24 hours
Creatinine Clearance: 28 mL/min — ABNORMAL LOW (ref 75–115)
Creatinine, 24H Ur: 754 mg/d (ref 700–1800)
Creatinine, Urine: 50.24 mg/dL
Urine Total Volume-CRCL: 1500 mL

## 2014-10-20 LAB — GLUCOSE, CAPILLARY
GLUCOSE-CAPILLARY: 210 mg/dL — AB (ref 70–99)
GLUCOSE-CAPILLARY: 151 mg/dL — AB (ref 70–99)
GLUCOSE-CAPILLARY: 142 mg/dL — AB (ref 70–99)
Glucose-Capillary: 233 mg/dL — ABNORMAL HIGH (ref 70–99)
Glucose-Capillary: 190 mg/dL — ABNORMAL HIGH (ref 70–99)

## 2014-10-20 LAB — PROTEIN, URINE, 24 HOUR
Collection Interval-UPROT: 24 hours
Protein, 24H Urine: 330 mg/d — ABNORMAL HIGH (ref 50–100)
Protein, Urine: 22 mg/dL
Urine Total Volume-UPROT: 1500 mL

## 2014-10-20 LAB — CBC
HEMATOCRIT: 22.5 % — AB (ref 36.0–46.0)
Hemoglobin: 7.6 g/dL — ABNORMAL LOW (ref 12.0–15.0)
MCH: 29.9 pg (ref 26.0–34.0)
MCHC: 33.8 g/dL (ref 30.0–36.0)
MCV: 88.6 fL (ref 78.0–100.0)
PLATELETS: 169 10*3/uL (ref 150–400)
RBC: 2.54 MIL/uL — ABNORMAL LOW (ref 3.87–5.11)
RDW: 15.2 % (ref 11.5–15.5)
WBC: 5.9 10*3/uL (ref 4.0–10.5)

## 2014-10-20 LAB — BASIC METABOLIC PANEL
Anion gap: 7 (ref 5–15)
BUN: 50 mg/dL — AB (ref 6–23)
CALCIUM: 9.5 mg/dL (ref 8.4–10.5)
CO2: 27 mmol/L (ref 19–32)
Chloride: 106 mmol/L (ref 96–112)
Creatinine, Ser: 1.87 mg/dL — ABNORMAL HIGH (ref 0.50–1.10)
GFR calc Af Amer: 30 mL/min — ABNORMAL LOW (ref >90)
GFR calc non Af Amer: 26 mL/min — ABNORMAL LOW (ref >90)
Glucose, Bld: 209 mg/dL — ABNORMAL HIGH (ref 70–99)
Potassium: 4.2 mmol/L (ref 3.5–5.1)
SODIUM: 140 mmol/L (ref 135–145)

## 2014-10-20 LAB — PROTIME-INR
INR: 2.06 — ABNORMAL HIGH (ref 0.00–1.49)
PROTHROMBIN TIME: 23.4 s — AB (ref 11.6–15.2)

## 2014-10-20 SURGERY — EGD (ESOPHAGOGASTRODUODENOSCOPY)
Anesthesia: Moderate Sedation | Laterality: Left

## 2014-10-20 MED ORDER — DOCUSATE SODIUM 100 MG PO CAPS
100.0000 mg | ORAL_CAPSULE | Freq: Every day | ORAL | Status: DC
  Administered 2014-10-20 – 2014-10-26 (×5): 100 mg via ORAL
  Filled 2014-10-20 (×6): qty 1

## 2014-10-20 MED ORDER — WARFARIN - PHARMACIST DOSING INPATIENT: Freq: Every day | Status: DC

## 2014-10-20 MED ORDER — HYDRALAZINE HCL 25 MG PO TABS
12.5000 mg | ORAL_TABLET | Freq: Three times a day (TID) | ORAL | Status: DC
  Administered 2014-10-20 – 2014-10-26 (×18): 12.5 mg via ORAL
  Filled 2014-10-20 (×18): qty 1

## 2014-10-20 MED ORDER — LIVING WELL WITH DIABETES BOOK
Freq: Once | Status: AC
  Administered 2014-10-23: 08:00:00
  Filled 2014-10-20: qty 1

## 2014-10-20 MED ORDER — OXYCODONE-ACETAMINOPHEN 5-325 MG PO TABS
2.0000 | ORAL_TABLET | Freq: Four times a day (QID) | ORAL | Status: DC | PRN
  Administered 2014-10-20: 2 via ORAL
  Filled 2014-10-20: qty 2

## 2014-10-20 MED ORDER — ISOSORBIDE MONONITRATE ER 30 MG PO TB24
30.0000 mg | ORAL_TABLET | Freq: Every day | ORAL | Status: DC
  Administered 2014-10-20 – 2014-10-26 (×7): 30 mg via ORAL
  Filled 2014-10-20 (×7): qty 1

## 2014-10-20 MED ORDER — INSULIN DETEMIR 100 UNIT/ML ~~LOC~~ SOLN
22.0000 [IU] | Freq: Every day | SUBCUTANEOUS | Status: DC
  Administered 2014-10-20 – 2014-10-24 (×5): 22 [IU] via SUBCUTANEOUS
  Filled 2014-10-20 (×6): qty 0.22

## 2014-10-20 MED ORDER — CARVEDILOL 6.25 MG PO TABS
6.2500 mg | ORAL_TABLET | Freq: Two times a day (BID) | ORAL | Status: DC
  Administered 2014-10-20 – 2014-10-26 (×12): 6.25 mg via ORAL
  Filled 2014-10-20 (×12): qty 1

## 2014-10-20 MED ORDER — FUROSEMIDE 40 MG PO TABS
60.0000 mg | ORAL_TABLET | Freq: Every day | ORAL | Status: DC
  Administered 2014-10-21 – 2014-10-26 (×6): 60 mg via ORAL
  Filled 2014-10-20 (×12): qty 1

## 2014-10-20 MED ORDER — DARBEPOETIN ALFA 40 MCG/0.4ML IJ SOSY
40.0000 ug | PREFILLED_SYRINGE | INTRAMUSCULAR | Status: DC
  Administered 2014-10-20: 40 ug via SUBCUTANEOUS
  Filled 2014-10-20: qty 0.4

## 2014-10-20 MED ORDER — FUROSEMIDE 40 MG PO TABS: 60.0000 mg | ORAL_TABLET | Freq: Every day | ORAL | Status: DC

## 2014-10-20 MED ORDER — WARFARIN SODIUM 2.5 MG PO TABS
2.5000 mg | ORAL_TABLET | Freq: Once | ORAL | Status: AC
  Administered 2014-10-20: 2.5 mg via ORAL
  Filled 2014-10-20: qty 1

## 2014-10-20 MED ORDER — INSULIN DETEMIR 100 UNIT/ML ~~LOC~~ SOLN
20.0000 [IU] | Freq: Every day | SUBCUTANEOUS | Status: DC
  Filled 2014-10-20: qty 0.2

## 2014-10-20 MED ORDER — SODIUM CHLORIDE 0.9 % IV SOLN
510.0000 mg | Freq: Once | INTRAVENOUS | Status: AC
  Administered 2014-10-20: 510 mg via INTRAVENOUS
  Filled 2014-10-20 (×2): qty 17

## 2014-10-20 MED ORDER — OXYCODONE-ACETAMINOPHEN 5-325 MG PO TABS
1.0000 | ORAL_TABLET | Freq: Four times a day (QID) | ORAL | Status: DC | PRN
  Administered 2014-10-21 – 2014-10-26 (×8): 1 via ORAL
  Filled 2014-10-20 (×8): qty 1

## 2014-10-20 NOTE — Discharge Instructions (Addendum)
Thank you for trusting [place] with your medical care!  You were hospitalized for anemia which we think is related to your kidney and thyroid disease along with small bleeding in your gut.   Please take note of the following changes to your medications: STOP lisinopril START Imdur 30 mg daily START Synthroid 25 g CHANGE Digoxin 0.0625 mg from daily to every other day CHANGE Coreg from 12.5 mg twice daily to 9.375 mg twice daily CHANGE Coumadin from 2.5 mg daily to 1.25 mg daily  To make sure you are getting better, please make it to the follow-up appointments listed on the first page. It is important that you follow-up with your PCP within the next week as he will need to be referred to the kidney doctor to receive the injections needed to keep your blood counts stable.   If you have any questions, please call [phone].     Information on my medicine - Coumadin   (Warfarin)  This medication education was reviewed with me or my healthcare representative as part of my discharge preparation.  The pharmacist that spoke with me during my hospital stay was:  Georgina Peer, [facility]  Why was Coumadin prescribed for you? Coumadin was prescribed for you because you have a blood clot or a medical condition that can cause an increased risk of forming blood clots. Blood clots can cause serious health problems by blocking the flow of blood to the heart, lung, or brain. Coumadin can prevent harmful blood clots from forming. As a reminder your indication for Coumadin is:   Stroke Prevention Because Of Atrial Fibrillation  What test will check on my response to Coumadin? While on Coumadin (warfarin) you will need to have an INR test regularly to ensure that your dose is keeping you in the desired range. The INR (international normalized ratio) number is calculated from the result of the laboratory test called prothrombin time (PT).  If an INR APPOINTMENT HAS NOT ALREADY BEEN MADE FOR YOU please  schedule an appointment to have this lab work done by your health care provider within 7 days. Your INR goal is usually a number between:  2 to 3 or your provider may give you a more narrow range like 2-2.5.  Ask your health care provider during an office visit what your goal INR is.  What  do you need to  know  About  COUMADIN? Take Coumadin (warfarin) exactly as prescribed by your healthcare provider about the same time each day.  DO NOT stop taking without talking to the doctor who prescribed the medication.  Stopping without other blood clot prevention medication to take the place of Coumadin may increase your risk of developing a new clot or stroke.  Get refills before you run out.  What do you do if you miss a dose? If you miss a dose, take it as soon as you remember on the same day then continue your regularly scheduled regimen the next day.  Do not take two doses of Coumadin at the same time.  Important Safety Information A possible side effect of Coumadin (Warfarin) is an increased risk of bleeding. You should call your healthcare provider right away if you experience any of the following: ? Bleeding from an injury or your nose that does not stop. ? Unusual colored urine (red or dark brown) or unusual colored stools (red or black). ? Unusual bruising for unknown reasons. ? A serious fall or if you hit your head (even if there  is no bleeding).  Some foods or medicines interact with Coumadin (warfarin) and might alter your response to warfarin. To help avoid this: ? Eat a balanced diet, maintaining a consistent amount of Vitamin K. ? Notify your provider about major diet changes you plan to make. ? Avoid alcohol or limit your intake to 1 drink for women and 2 drinks for men per day. (1 drink is 5 oz. wine, 12 oz. beer, or 1.5 oz. liquor.)  Make sure that ANY health care provider who prescribes medication for you knows that you are taking Coumadin (warfarin).  Also make sure the  healthcare provider who is monitoring your Coumadin knows when you have started a new medication including herbals and non-prescription products.  Coumadin (Warfarin)  Major Drug Interactions  Increased Warfarin Effect Decreased Warfarin Effect  Alcohol (large quantities) Antibiotics (esp. Septra/Bactrim, Flagyl, Cipro) Amiodarone (Cordarone) Aspirin (ASA) Cimetidine (Tagamet) Megestrol (Megace) NSAIDs (ibuprofen, naproxen, etc.) Piroxicam (Feldene) Propafenone (Rythmol SR) Propranolol (Inderal) Isoniazid (INH) Posaconazole (Noxafil) Barbiturates (Phenobarbital) Carbamazepine (Tegretol) Chlordiazepoxide (Librium) Cholestyramine (Questran) Griseofulvin Oral Contraceptives Rifampin Sucralfate (Carafate) Vitamin K   Coumadin (Warfarin) Major Herbal Interactions  Increased Warfarin Effect Decreased Warfarin Effect  Garlic Ginseng Ginkgo biloba Coenzyme Q10 Green tea St. Johns wort    Coumadin (Warfarin) FOOD Interactions  Eat a consistent number of servings per week of foods HIGH in Vitamin K (1 serving =  cup)  Collards (cooked, or boiled & drained) Kale (cooked, or boiled & drained) Mustard greens (cooked, or boiled & drained) Parsley *serving size only =  cup Spinach (cooked, or boiled & drained) Swiss chard (cooked, or boiled & drained) Turnip greens (cooked, or boiled & drained)  Eat a consistent number of servings per week of foods MEDIUM-HIGH in Vitamin K (1 serving = 1 cup)  Asparagus (cooked, or boiled & drained) Broccoli (cooked, boiled & drained, or raw & chopped) Brussel sprouts (cooked, or boiled & drained) *serving size only =  cup Lettuce, raw (green leaf, endive, romaine) Spinach, raw Turnip greens, raw & chopped   These websites have more information on Coumadin (warfarin):  FailFactory.se; VeganReport.com.au;

## 2014-10-20 NOTE — Progress Notes (Addendum)
Subjective:  Patient states that she continues to feel better this morning after transfusion yesterday. She has not had a chance to be up and out of bed yet today. Patient otherwise denying any other complaints.  Objective: Vital signs in last 24 hours: Filed Vitals:   10/19/14 1200 10/19/14 1259 10/19/14 1956 10/20/14 0500  BP: 113/103 116/54 134/58 141/60  Pulse: 71 69 70   Temp:  98.6 F (37 C) 98.2 F (36.8 C) 98.6 F (37 C)  TempSrc:  Oral Oral Oral  Resp: '23  20 18  ' Height:  '5\' 5"'  (1.651 m)    Weight:    154 lb 14.4 oz (70.262 kg)  SpO2: 98% 96% 98% 99%   Weight change: -11.9 oz (-0.338 kg)  Intake/Output Summary (Last 24 hours) at 10/20/14 1108 Last data filed at 10/20/14 0500  Gross per 24 hour  Intake    345 ml  Output   1100 ml  Net   -755 ml    General: resting in bed, in no acute distress HEENT: PERRL, EOMI, no scleral icterus, persistent conjunctival pallor Cardiac: RRR, no rubs or gallops, 2/6 systolic murmur Pulm: clear to auscultation bilaterally, moving normal volumes of air Abd: soft, nontender, nondistended, BS present Ext: warm and well perfused, no pedal edema Neuro: alert and oriented X3, cranial nerves II-XII grossly intact Skin: no rashes or lesions noted Psych: appropriate affect  Lab Results: Basic Metabolic Panel:  Recent Labs Lab 10/19/14 0253 10/20/14 0706  NA 138 140  K 4.2 4.2  CL 103 106  CO2 26 27  GLUCOSE 243* 209*  BUN 54* 50*  CREATININE 2.10* 1.87*  CALCIUM 9.1 9.5   Liver Function Tests:  Recent Labs Lab 10/17/14 0915 10/17/14 1000  AST 14 19  ALT 11 13  ALKPHOS 71 73  BILITOT 0.5 0.8  PROT 6.8 7.0  ALBUMIN 3.7 3.9   No results for input(s): LIPASE, AMYLASE in the last 168 hours. No results for input(s): AMMONIA in the last 168 hours. CBC:  Recent Labs Lab 10/17/14 1000  10/19/14 1858 10/20/14 0706  WBC 9.0  < > 6.2 5.9  NEUTROABS 7.5  --   --   --   HGB 6.7*  < > 7.7* 7.6*  HCT 19.8*  < > 22.5*  22.5*  MCV 90.0  < > 87.9 88.6  PLT 200  < > 164 169  < > = values in this interval not displayed. Cardiac Enzymes:  Recent Labs Lab 10/17/14 1000  TROPONINI 0.03   BNP: No results for input(s): PROBNP in the last 168 hours. D-Dimer: No results for input(s): DDIMER in the last 168 hours. CBG:  Recent Labs Lab 10/19/14 0838 10/19/14 1212 10/19/14 1256 10/19/14 1655 10/19/14 2050 10/20/14 0742  GLUCAP 313* 342* 341* 325* 234* 210*   Hemoglobin A1C:  Recent Labs Lab 10/17/14 1909  HGBA1C 9.6*   Fasting Lipid Panel: No results for input(s): CHOL, HDL, LDLCALC, TRIG, CHOLHDL, LDLDIRECT in the last 168 hours. Thyroid Function Tests:  Recent Labs Lab 10/17/14 0915  TSH 19.740*  FREET4 0.83  T3FREE 2.3   Coagulation:  Recent Labs Lab 10/18/14 0850 10/19/14 0253  LABPROT 35.3* 37.3*  INR 3.49* 3.74*   Anemia Panel:  Recent Labs Lab 10/18/14 0850  VITAMINB12 511  FOLATE 8.4  FERRITIN 18  TIBC 279  IRON 86  RETICCTPCT 2.1   Urine Drug Screen: Drugs of Abuse  No results found for: LABOPIA, COCAINSCRNUR, LABBENZ, AMPHETMU, THCU, LABBARB  Alcohol Level: No results for input(s): ETH in the last 168 hours. Urinalysis:  Recent Labs Lab 10/17/14 1050  COLORURINE YELLOW  LABSPEC 1.012  PHURINE 6.5  GLUCOSEU >1000*  HGBUR NEGATIVE  BILIRUBINUR NEGATIVE  KETONESUR NEGATIVE  PROTEINUR NEGATIVE  UROBILINOGEN 0.2  NITRITE NEGATIVE  LEUKOCYTESUR NEGATIVE   Micro Results: Recent Results (from the past 240 hour(s))  MRSA PCR Screening     Status: Abnormal   Collection Time: 10/17/14  6:09 PM  Result Value Ref Range Status   MRSA by PCR POSITIVE (A) NEGATIVE Final    Comment:        The GeneXpert MRSA Assay (FDA approved for NASAL specimens only), is one component of a comprehensive MRSA colonization surveillance program. It is not intended to diagnose MRSA infection nor to guide or monitor treatment for MRSA infections. RESULT CALLED TO,  READ BACK BY AND VERIFIED WITH: Carley Hammed RN 1914 10/17/14 A BROWNING    Studies/Results: No results found. Medications: I have reviewed the patient's current medications. Scheduled Meds: . sodium chloride   Intravenous Once  . amiodarone  200 mg Oral Daily  . carvedilol  3.125 mg Oral BID WC  . Chlorhexidine Gluconate Cloth  6 each Topical Q0600  . digoxin  0.0625 mg Oral QODAY  . feeding supplement (ENSURE ENLIVE)  237 mL Oral BID BM  . insulin aspart  0-15 Units Subcutaneous TID WC  . insulin aspart  0-5 Units Subcutaneous QHS  . insulin detemir  16 Units Subcutaneous QHS  . levothyroxine  25 mcg Oral QAC breakfast  . living well with diabetes book   Does not apply Once  . mupirocin ointment  1 application Nasal BID  . pantoprazole (PROTONIX) IV  40 mg Intravenous Q24H  . polyethylene glycol  17 g Oral Daily  . pravastatin  80 mg Oral QHS   Continuous Infusions:   PRN Meds:.acetaminophen **OR** acetaminophen, ondansetron **OR** ondansetron (ZOFRAN) IV, simethicone Assessment/Plan: Principal Problem:   Hypotension Active Problems:   Cardiomyopathy, nonischemic   Diabetes mellitus, type 2   Anemia, normocytic normochromic   CHF (congestive heart failure)   Patient is a 69 year old with a history of symptomatic anemia, chronic kidney disease, type 2 diabetes, nonischemic cardiomyopathy status post ICD placement, paroxysmal atrial fibrillation who is admitted for symptomatic anemia.  Symptomatic normocytic anemia: Patient's symptoms are slightly improved over the last 2 days, after a total of 3 units packed red blood cells. Patient's hemoglobin is up to 7.6 from 7.3 yesterday status post 1 unit of packed red blood cells. This does not seem to be in appropriate response to transfusion, although there does not seem to be any evidence of bleeding or hemolysis. Patient's normocytic anemia may be secondary to a multifactorial picture. Patient has hypothyroidism in addition to  chronic kidney disease. However, will complete workup for multiple myeloma and will keep low-grade myelodysplastic syndrome on the differential. -Appreciate assistance from hematology. -Hold on further endoscopy at this point. -Multiple myeloma panel pending. -24 hour urine for creatinine clearance, total protein, and immunofixation electrophoresis pending.  Nonischemic cardiomyopathy: Patient remains euvolemic. Repeat echocardiogram from this admission showing a stable ejection fraction of 15-20% with grade 2 diastolic dysfunction. Status post placement of a [employer] CRT-D device placed in August 2014. Interrogation showing no evidence of atrial fibrillation or ventricular tachycardia.  At home, patient is on Coreg 12.5 mg twice a day, lisinopril 2.5 mg daily. Patient has not an LVAD candidate. -Appreciate cardiology recommendations -Continue Coreg 3.125 mg twice  a day. -Continue to hold lisinopril in the setting of elevated creatinine. -Hold Lasix today given euvolemia. -Continue digoxin to every other day dosing given a level of 1.1 yesterday. -Hold home Coreg and lisinopril at this point given low blood pressures. -Repeat echocardiogram -No signs of fluid overload status post blood transfusion  Acute? on chronic kidney disease: Patient's creatinine has continued to trend down to 1.87 from 2.1. No concurrent electrolyte abnormalities. -Continue to monitor. -Holding lisinopril. -Holding Lasix -Multiple myeloma panel as above  Type 2 diabetes: Patient's blood glucose levels remain elevated at 209 this morning. Patient is not on any diabetes medications at home. -Continue with sliding scale insulin -Increase levemir to 20 units daily (from 16 units daily).  Hypothyroidism: TSH of 19.7. Free T4 within normal limits of 0.83 although patient may be symptomatic from her thyroid state which argues against subclinical hypothyroidism. At home, patient is not on any thyroid supplementation.   -Synthroid 25 g daily  Paroxysmal atrial fibrillation: Last cardioverted in December 2014. Interrogation in cardiologist clinic unremarkable for any atrial fibrillation or ventricular tachycardia. At home, patient is on amiodarone 200 mg daily, carvedilol 12.5 mg twice a day, digoxin 0.5 mg daily, and Coumadin. -Continue home amiodarone and digoxin at reduced dose as above. -Continue home carvedilol 3.125 mg twice a day. -Consider resuming coumadin given lower suspicion for bleeding.   Dispo: Disposition is deferred at this time, awaiting improvement of current medical problems. Anticipated discharge in approximately 0-1 day(s).   The patient does have a current PCP Lemmie Evens, MD) and does need an [facility] hospital follow-up appointment after discharge.  The patient does not have transportation limitations that hinder transportation to clinic appointments.   LOS: 3 days   Services Needed at time of discharge: Y = Yes, Blank = No PT:   OT:   RN:   Equipment:   Other:    Luan Moore, MD 10/20/2014, 11:08 AM

## 2014-10-20 NOTE — Progress Notes (Addendum)
ANTICOAGULATION CONSULT NOTE - Initial Consult  Pharmacy Consult for Coumadin / Aranesp Indication: atrial fibrillation  No Known Allergies  Patient Measurements: Height: 5\' 5"  (165.1 cm) Weight: 154 lb 14.4 oz (70.262 kg) IBW/kg (Calculated) : 57  Vital Signs: Temp: 98.2 F (36.8 C) (04/29 1320) Temp Source: Oral (04/29 1320) BP: 148/67 mmHg (04/29 1320) Pulse Rate: 71 (04/29 1320)  Labs:  Recent Labs  10/18/14 0304 10/18/14 0850  10/19/14 0253 10/19/14 1858 10/20/14 0706  HGB 7.6*  --   < > 7.3* 7.7* 7.6*  HCT 22.6*  --   < > 21.1* 22.5* 22.5*  PLT 160  --   < > 170 164 169  LABPROT  --  35.3*  --  37.3*  --   --   INR  --  3.49*  --  3.74*  --   --   CREATININE 2.46*  --   --  2.10*  --  1.87*  < > = values in this interval not displayed.  Estimated Creatinine Clearance: 27.5 mL/min (by C-G formula based on Cr of 1.87).   Medical History: Past Medical History  Diagnosis Date  . Cardiomyopathy, nonischemic     a. 1999 nl cath;  b. 12/05 [employer];  c. 10/2005 ICD extraction 2/2 enterococcus bacteremia and Veg on RV lead;  c. 05/2008 low risk Myoview (scarring w/ some evidence of inf ischemia);  d. 11/2012 Echo: EF 15-20%;  e. 01/2013 s/p MDT [place] CRT D, ser # [id number];  f. 05/2013 Echo: EF 15%.  . Enterococcal infection     a. 10/2005 - AICD-explanted  . Pulmonary embolism     a. 07/2005 after total right hip arthroplasty  . Hilar density     a. infrahilar mass/adenopathy on CT scan 5/07; subsequently  resolved  . LBBB (left bundle branch block)   . GERD (gastroesophageal reflux disease)   . Hyperlipidemia   . Hypertension   . Tobacco abuse     a. discontinued in 1997, and then resumed  . Urinary incontinence   . Anemia     a. mild/chronic  . Villous adenoma of colon     a. tubovillous adenomatous polyp with focal high grade dysplasia; presented with hematochezia - followed by Dr. Laural Golden.  . CKD (chronic kidney disease), stage III    creatinin-1.44 in 1/09; 1.51 in 1/10  . Implantable cardioverter-defibrillator-CRT- Mdt     a.  01/2013 s/p MDT [place] CRT D, ser # [id number]  . Chronic systolic CHF (congestive heart failure)     a. 11/2012 Echo: EF 15-20%;  b. 05/2013 TEE EF 15%.  . Pneumonia 07/2005  . Obstructive sleep apnea     a. mild-did not tolerate CPAP (01/24/2013)  . Type II diabetes mellitus   . History of blood transfusion   . Degenerative joint disease     of knees, shoulder, and hips  . PAF (paroxysmal atrial fibrillation)     a. 07/2012 s/p TEE/DCCV;  b. chronic coumadin;  c. 05/2013 Recurrent Afib->TEE/DCCV and amio initiation.    Assessment: 69 yo with nonischemic CMP/chronic systolic CHF, paroxysmal atrial fibrillation, CKD, and history of anemia presented with profound anemia, weakness and hypotension.   Hgb 7.6 after 2 units yesterday.INR >3 yesterday, not done this am, will check again now. No further GI work up planned and orders received to restart warfarin once INR<3.   Home dose of warfarin is 2.5mg  daily  Goal of Therapy:  INR 2-3 Monitor platelets by anticoagulation  protocol: Yes   Plan:  Restart coumadin after INR drops below 3 Daily INR  Erin Hearing PharmD., BCPS Clinical Pharmacist Pager [phone] 10/20/2014 2:48 PM  Added Aranesp for anemia of chronic disease == 40 mg sq Q week INR = 2.06  Plan--> Coumadin 2.5 mg po x 1 tonight with daily INR   Thank you. Anette Guarneri, PharmD 8 pm

## 2014-10-20 NOTE — Evaluation (Signed)
Physical Therapy Evaluation Patient Details Name: [name] MRN: [id number] DOB: 02/23/1946 Today's Date: 10/20/2014   History of Present Illness  Patient is a 69 year old with a history of symptomatic anemia, chronic kidney disease, type 2 diabetes, nonischemic cardiomyopathy status post ICD placement, paroxysmal atrial fibrillation who presents with symptomatic anemia and hypotension  Clinical Impression  Patient presents with generalized weakness due to hospitalization and chronic anemia.  She will benefit from skilled PT in the acute setting to allow return home with spouse assist.  Recommended follow up HHPT, but patient concerned about cost.  Referred to RN case mgr regarding insurance coverage.   Follow Up Recommendations Home health PT;Supervision - Intermittent    Equipment Recommendations       Recommendations for Other Services       Precautions / Restrictions Precautions Precautions: Fall      Mobility  Bed Mobility Overal bed mobility: Modified Independent                Transfers Overall transfer level: Needs assistance Equipment used: Rolling walker (2 wheeled) Transfers: Sit to/from Stand Sit to Stand: Supervision         General transfer comment: c/o dizziness sitting edge of bed BP okay, monitored for safety with transfers and ambulation  Ambulation/Gait Ambulation/Gait assistance: Supervision Ambulation Distance (Feet): 150 Feet Assistive device: 4-wheeled walker Gait Pattern/deviations: Step-through pattern     General Gait Details: good speed and generally safe technique; seems weak overall, but denies  Stairs            Wheelchair Mobility    Modified Rankin (Stroke Patients Only)       Balance Overall balance assessment: Needs assistance           Standing balance-Leahy Scale: Fair Standing balance comment: can stand without UE support, but relies on walker for ambulation                              Pertinent Vitals/Pain Pain Assessment: No/denies pain    Home Living Family/patient expects to be discharged to:: Private residence Living Arrangements: Spouse/significant other Available Help at Discharge: Family Type of Home: House Home Access: Stairs to enter Entrance Stairs-Rails: Psychiatric nurse of Steps: 8 Home Layout: One level Home Equipment: Environmental consultant - 4 wheels;Cane - single point;Shower seat;Wheelchair - Press photographer;Bedside commode      Prior Function Level of Independence: Independent with assistive device(s)         Comments: reports she does household chores and drives     Hand Dominance   Dominant Hand: Right    Extremity/Trunk Assessment               Lower Extremity Assessment: Generalized weakness         Communication   Communication: No difficulties  Cognition Arousal/Alertness: Awake/alert Behavior During Therapy: WFL for tasks assessed/performed Overall Cognitive Status: Within Functional Limits for tasks assessed                      General Comments      Exercises        Assessment/Plan    PT Assessment Patient needs continued PT services  PT Diagnosis Generalized weakness   PT Problem List Decreased strength;Decreased mobility;Decreased balance;Decreased knowledge of use of DME;Decreased activity tolerance  PT Treatment Interventions DME instruction;Therapeutic exercise;Gait training;Balance training;Functional mobility training;Therapeutic activities;Patient/family education   PT Goals (Current goals can be found in  the Care Plan section) Acute Rehab PT Goals Patient Stated Goal: To go home PT Goal Formulation: With patient Time For Goal Achievement: 10/27/14 Potential to Achieve Goals: Good    Frequency Min 3X/week   Barriers to discharge        Co-evaluation               End of Session Equipment Utilized During Treatment: Gait belt Activity Tolerance: Patient  tolerated treatment well Patient left: in bed;with call bell/phone within reach           Time: 1348-1414 PT Time Calculation (min) (ACUTE ONLY): 26 min   Charges:   PT Evaluation $Initial PT Evaluation Tier I: 1 Procedure PT Treatments $Gait Training: 8-22 mins   PT G Codes:        Tyson Masin,CYNDI 06-Nov-2014, 3:13 PM  Magda Kiel, [place] Nov 06, 2014

## 2014-10-20 NOTE — Progress Notes (Addendum)
Consult Note:   Spoke with patient about diabetes and home regimen for diabetes control. Patient reports that she is followed by her PCP in [place] for diabetes management and currently she states she takes an oral medication daily but does not remember the name of it. She takes her medication regularly.  Inquired about knowledge about A1C and patient reports that she does not know what an A1C is. Discussed A1C and explained what an A1C is, basic pathophysiology of DM Type 2, basic home care, importance of checking CBGs and maintaining good CBG control to prevent long-term and short-term complications. Patient tells me that she does not have a working meter or supplies at home. Discussed impact of nutrition, exercise, stress, sickness, and medications on diabetes control.  Patient states that she drinks regular Pepsi, milk and water at home. She also does not eat regular intervals for meals. Discussed carbohydrates, carbohydrate goals per day and meal, along with portion sizes. Gave patient diabetes meal planning handout and Living Well with Diabetes book. Patient verbalized understanding of information discussed and she states that she has no further questions at this time related to diabetes.   A1c 9.6%. Due to level of A1c and basal insulin dosage. Patient may need to be placed on basal insulin to go home with at time of discharge.  MD: at time of discharge patient will need an order for glucose meter kit.  Thanks,  Tama Headings RN, MSN, [facility] Inpatient Diabetes Coordinator Team Pager [phone]

## 2014-10-20 NOTE — Progress Notes (Addendum)
Patient ID: [name], female   DOB: 1946-03-27, 69 y.o.   MRN: [id number]   SUBJECTIVE: Patient was admitted with profound anemia (hgb 6.4), weakness, and hypotension.  She has received a total of 3 units PRBCs.  Diuretics held with initial AKI and hypotension.  Hemoglobin stable today.   Overall feeling better.   Creatinine 2.46>2.1>1.87   ECHO EF 15-20% Grade II D, mild MR, mild to moderate AI, RV normal.   Scheduled Meds: . sodium chloride   Intravenous Once  . amiodarone  200 mg Oral Daily  . carvedilol  6.25 mg Oral BID WC  . Chlorhexidine Gluconate Cloth  6 each Topical Q0600  . digoxin  0.0625 mg Oral QODAY  . feeding supplement (ENSURE ENLIVE)  237 mL Oral BID BM  . ferumoxytol  510 mg Intravenous Once  . [START ON 10/21/2014] furosemide  60 mg Oral Daily  . hydrALAZINE  12.5 mg Oral TID  . insulin aspart  0-15 Units Subcutaneous TID WC  . insulin aspart  0-5 Units Subcutaneous QHS  . insulin detemir  22 Units Subcutaneous QHS  . isosorbide mononitrate  30 mg Oral Daily  . levothyroxine  25 mcg Oral QAC breakfast  . living well with diabetes book   Does not apply Once  . mupirocin ointment  1 application Nasal BID  . pantoprazole (PROTONIX) IV  40 mg Intravenous Q24H  . polyethylene glycol  17 g Oral Daily  . pravastatin  80 mg Oral QHS   Continuous Infusions:   PRN Meds:.acetaminophen **OR** acetaminophen, ondansetron **OR** ondansetron (ZOFRAN) IV, simethicone   Filed Vitals:   10/19/14 1259 10/19/14 1956 10/20/14 0500 10/20/14 1320  BP: 116/54 134/58 141/60 148/67  Pulse: 69 70  71  Temp: 98.6 F (37 C) 98.2 F (36.8 C) 98.6 F (37 C) 98.2 F (36.8 C)  TempSrc: Oral Oral Oral Oral  Resp:  20 18 16   Height: 5\' 5"  (1.651 m)     Weight:   154 lb 14.4 oz (70.262 kg)   SpO2: 96% 98% 99% 99%    Intake/Output Summary (Last 24 hours) at 10/20/14 1439 Last data filed at 10/20/14 1300  Gross per 24 hour  Intake    240 ml  Output   1101 ml  Net   -861 ml     LABS: Basic Metabolic Panel:  Recent Labs  10/19/14 0253 10/20/14 0706  NA 138 140  K 4.2 4.2  CL 103 106  CO2 26 27  GLUCOSE 243* 209*  BUN 54* 50*  CREATININE 2.10* 1.87*  CALCIUM 9.1 9.5   Liver Function Tests: No results for input(s): AST, ALT, ALKPHOS, BILITOT, PROT, ALBUMIN in the last 72 hours. No results for input(s): LIPASE, AMYLASE in the last 72 hours. CBC:  Recent Labs  10/19/14 1858 10/20/14 0706  WBC 6.2 5.9  HGB 7.7* 7.6*  HCT 22.5* 22.5*  MCV 87.9 88.6  PLT 164 169   Cardiac Enzymes: No results for input(s): CKTOTAL, CKMB, CKMBINDEX, TROPONINI in the last 72 hours. BNP: Invalid input(s): POCBNP D-Dimer: No results for input(s): DDIMER in the last 72 hours. Hemoglobin A1C:  Recent Labs  10/17/14 1909  HGBA1C 9.6*   Fasting Lipid Panel: No results for input(s): CHOL, HDL, LDLCALC, TRIG, CHOLHDL, LDLDIRECT in the last 72 hours. Thyroid Function Tests: No results for input(s): TSH, T4TOTAL, T3FREE, THYROIDAB in the last 72 hours.  Invalid input(s): FREET3 Anemia Panel:  Recent Labs  10/18/14 0850  VITAMINB12 511  FOLATE  8.4  FERRITIN 18  TIBC 279  IRON 86  RETICCTPCT 2.1    RADIOLOGY: Dg Chest 2 View  10/17/2014   CLINICAL DATA:  Dizziness, nausea 2 days  EXAM: CHEST  2 VIEW  COMPARISON:  01/10/2014  FINDINGS: There is no focal parenchymal opacity, pleural effusion, or pneumothorax. The heart and mediastinal contours are stable. There is a 3 lead AICD.  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  IMPRESSION: No active cardiopulmonary disease.   Electronically Signed   By: [name]   On: 10/17/2014 11:41    PHYSICAL EXAM General: NAD Neck: JVP 7-8 cm, no thyromegaly or thyroid nodule.  Lungs: Clear to auscultation bilaterally with normal respiratory effort. CV: Nondisplaced PMI.  Heart regular S1/S2, no S3/S4, 2/6 HSM LLSB/apex.  No peripheral edema.  No carotid bruit.  Normal pedal pulses.  Abdomen: Soft,  nontender, no hepatosplenomegaly, no distention.  Neurologic: Alert and oriented x 3.  Psych: Normal affect. Extremities: No clubbing or cyanosis.   TELEMETRY: Reviewed telemetry pt in A-BiV paced  ASSESSMENT AND PLAN: 69 yo with nonischemic CMP/chronic systolic CHF, paroxysmal atrial fibrillation, CKD, and history of anemia presented with profound anemia, weakness and hypotension.  1. Chronic systolic CHF: Patient has a nonischemic cardiomyopathy. Echo EF 15-20% Grade II DD. NYHA class III symptoms. RHC (10/15) showed low filling pressures and normal cardiac output.  She is unable to do CPX testing and would not be LVAD candidate.  Creatinine back down to actually below her baseline now.  She does not look volume overloaded. BP much higher today.  - Increase Coreg to 6.25 mg bid.   - I am going to keep her off lisinopril with significant CKD.  No spironolactone.  - Will start low dose hydralazine/nitrates.  - Digoxin every other day with level 1.1. - She can restart Lasix tomorrow but will use lower dose for now, 60 mg daily rather than 60 mg bid.   2. Atrial fibrillation: Paroxysmal. She is a-paced currently.  - She will continue amiodarone for now. TSH high, Levoxyl begun. - She has been on coumadin, INR has been high.  Given low suspicion for GI bleed as cause of anemia, she can resume coumadin when INR drifts down.  3. AKI on CKD: Improved. Will keep off lisinopril, resume lower dose of Lasix tomorrow.   4. Anemia: She had EGD and c-scope in 12/14. In 12/15, she had transfusion but apparently no-showed for her GI workup. Seen by hematology this admission, suspect normocytic anemia is most likely due to combination of CKD and hypothyroidism, less likely MDS.   Hemoglobin stable over the last day.  Would likely be helpful to get her started on erythropoietin.    Loralie Champagne  10/20/2014 2:39 PM

## 2014-10-20 NOTE — Progress Notes (Signed)
  Date: 10/20/2014  Patient name: [name]  Medical record number: [id number]  Date of birth: 12-25-1945   This patient's plan of care was discussed with the house staff. Please see Dr. Trudee Kuster note for complete details. I concur with his findings.  [name] did report feeling better today.  She has some ongoing work up for multiple myeloma and a 24 hour urine collection.  Monitor H/H for stability.  Iron infusion and start epo.  I have reviewed Hematology's note from yesterday and Cardiology's note from today.  Increase coreg per Cardiology, start back lasix and add hydralazine.    Sid Falcon, MD 10/20/2014, 2:56 PM

## 2014-10-20 NOTE — Discharge Summary (Signed)
Name: [name] MRN: [id number] DOB: Jun 30, 1945 69 y.o. PCP: Lemmie Evens, MD  Date of Admission: 10/17/2014  9:43 AM Date of Discharge: 10/26/2014 Attending Physician:  Dr. Gilles Chiquito  Discharge Diagnosis: Symptomatic acute on chronic normocytic anemia Nonischemic cardiomyopathy Chronic kidney disease stage III Type 2 diabetes Hypothyroidism Paroxysmal atrial fibrillation  Discharge Medications:   Medication List    STOP taking these medications        lisinopril 2.5 MG tablet  Commonly known as:  PRINIVIL,ZESTRIL     ondansetron 4 MG tablet  Commonly known as:  ZOFRAN      TAKE these medications        amiodarone 200 MG tablet  Commonly known as:  PACERONE  Take 1 tablet (200 mg total) by mouth daily.     carvedilol 3.125 MG tablet  Commonly known as:  COREG  Take 3 tablets (9.375 mg total) by mouth 2 (two) times daily with a meal.     cetirizine 10 MG tablet  Commonly known as:  ZYRTEC  Take 10 mg by mouth daily.     colchicine 0.6 MG tablet  Take 0.6 mg by mouth 2 (two) times daily.     digoxin 0.125 MG tablet  Commonly known as:  LANOXIN  Take 0.5 tablets (0.0625 mg total) by mouth every other day.     feeding supplement (ENSURE ENLIVE) Liqd  Take 237 mLs by mouth 2 (two) times daily between meals.     furosemide 40 MG tablet  Commonly known as:  LASIX  Take 1.5 tablets (60 mg total) by mouth 2 (two) times daily.     isosorbide mononitrate 30 MG 24 hr tablet  Commonly known as:  IMDUR  Take 1 tablet (30 mg total) by mouth daily.     ivabradine 5 MG Tabs tablet  Commonly known as:  CORLANOR  Take 0.5 tablets (2.5 mg total) by mouth 2 (two) times daily with a meal.     levothyroxine 25 MCG tablet  Commonly known as:  SYNTHROID, LEVOTHROID  Take 1 tablet (25 mcg total) by mouth daily before breakfast.     metoCLOPramide 5 MG tablet  Commonly known as:  REGLAN  Take 5 mg by mouth 4 (four) times daily.     oxyCODONE-acetaminophen 10-325  MG per tablet  Commonly known as:  PERCOCET  Take 1 tablet by mouth every 6 (six) hours as needed for pain.     pravastatin 40 MG tablet  Commonly known as:  PRAVACHOL  Take 80 mg by mouth at bedtime.     warfarin 2.5 MG tablet  Commonly known as:  COUMADIN  Take 0.5 tablets (1.25 mg total) by mouth daily at 6 PM.        Disposition and follow-up:   [name] was discharged from [facility] in [place] condition.  At the hospital follow up visit please address:  Referral to nephrology for Aranesp  Initiation of insulin therapy for uncontrolled diabetes  Prescribing glucometer  Recheck TSH in 4-6 weeks  Recheck INR with goal to 2.5  Recheck kidney function to determine appropriateness of restarting lisinopril   Follow-up Appointments: Follow-up Information    Follow up with [facility].   Why:  Physical therapy   Contact information:   [address] [place] [address] [phone]       Follow up with Glori Bickers, MD. Go on 11/02/2014.   Specialty:  Cardiology   Why:  920AM   Contact information:   [place] [address] [phone]       Follow up with Robert Bellow, MD. Schedule an appointment as soon as possible for a visit in 1 week.   Specialty:  Family Medicine   Contact information:   [place] [address] [phone]       Discharge Instructions: Discharge Instructions    Call MD for:  difficulty breathing, headache or visual disturbances    Complete by:  As directed      Call MD for:  extreme fatigue    Complete by:  As directed      Call MD for:  persistant dizziness or light-headedness    Complete by:  As directed      Call MD for:  redness, tenderness, or signs of infection (pain, swelling, redness, odor or green/yellow discharge around incision site)    Complete by:  As directed      Call MD for:  temperature >100.4     Complete by:  As directed      Diet - low sodium heart healthy    Complete by:  As directed      Increase activity slowly    Complete by:  As directed            Consultations: Treatment Team:  Annia Belt, MD  Procedures Performed:  Dg Chest 2 View  10/17/2014   CLINICAL DATA:  Dizziness, nausea 2 days  EXAM: CHEST  2 VIEW  COMPARISON:  01/10/2014  FINDINGS: There is no focal parenchymal opacity, pleural effusion, or pneumothorax. The heart and mediastinal contours are stable. There is a 3 lead AICD.  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  IMPRESSION: No active cardiopulmonary disease.   Electronically Signed   By: [name]   On: 10/17/2014 11:41    2D Echo:   Study Conclusions  - Left ventricle: The cavity size was normal. Wall thickness was increased in a pattern of moderate LVH. Systolic function was severely reduced. The estimated ejection fraction was in the range of 15% to 20%. Diffuse hypokinesis. Features are consistent with a pseudonormal left ventricular filling pattern, with concomitant abnormal relaxation and increased filling pressure (grade 2 diastolic dysfunction). - Aortic valve: Mildly calcified annulus. Trileaflet; mildly thickened leaflets. There was mild to moderate regurgitation. Valve area (VTI): 1.51 cm^2. Valve area (Vmax): 1.67 cm^2. Regurgitation pressure half-time: 479 ms. - Mitral valve: Mildly calcified annulus. Mildly thickened leaflets . There was mild regurgitation. - Left atrium: The atrium was severely dilated. - Right atrium: The atrium was mildly dilated. - Pulmonary arteries: PA peak pressure: 33 mm Hg (S). PASP is borderline elevated. - Technically difficult study.  Cardiac Cath: none  Admission HPI:   Patient is a 69 year old with a history of symptomatic anemia, chronic kidney disease, type 2 diabetes, nonischemic cardiomyopathy status post ICD placement, paroxysmal atrial  fibrillation who presents with symptomatic anemia and hypotension. Patient presented from her cardiologist's office for a regular checkup when she was noted to have a blood pressure and there was concern from Dr. Aundra Dubin that patient was experiencing symptomatic anemia. Patient states that she has been feeling malaise and dizziness over the last several days. She states that she simply does not have much energy to do most of her daily activities. She denies any episodes of syncope but does report having some sensations of presyncope. She states that episodes like this have happened in  the past before. She has received blood transfusions which have helped with her symptoms in the past, and she has had multiple transfusions in the past at a frequency of about every couple months. She denies any hematochezia or melena. She denies any hematuria or passing of blood vaginally. She denies any other sources of bleeding. She denies any history of thyroid disease or being on any thyroid medications. Patient denies any family history of hematologic disorders.  Otherwise, patient denies any fevers, chills, vomiting, chest pain, dyspnea, diarrhea, constipation, dysuria, or hematuria.  Hospital Course by problem list:  Symptomatic acute on chronic normocytic anemia: Likely secondary to minor GI bleeding in the setting of chronic disease. Patient initially presented from cardiology clinic with symptomatic anemia with hemoglobin 6.4. Throughout her hospital course, she consistently had normocytic anemia which did not increase appropriately with pRBC transfusions 4. Workup for hemolysis with bilirubin, haptoglobin, and LDH was unremarkable though may have been confounded by timing as they were drawn after first round of transfusions. Iron panel was unremarkable for iron deficiency anemia although ferritin was on the lower limit of normal [results noted below]. Hematology was consulted and recommended evaluation for multiple  myeloma given her renal dysfunction though was also unremarkable. She also received Feraheme and Aranesp further recommendations as they felt she likely had a chronic anemia secondary to her various comorbidities. FOBT was positive though GI was not consulted as she had a reassuring upper and lower endoscopies in December 2014, but she had several bloody bowel movements. GI was then consulted. Upper endoscopy was otherwise unremarkable, and colonoscopy was notable for diverticula and one polyp which was resected. Additional capsule endoscopy was notable for several lymphangiectasia is but no overt source of blood loss. At the time of discharge, she was advised to begin seeing a nephrologist given her renal dysfunction and to assist with Aranesp dosing [hematology recommended 300 g every 3 weeks].  Nonischemic cardiomyopathy: Patient remained euvolemic throughout her admission. A repeat echocardiogram showed stable though depressed ejection fraction of 15-20% with grade 2 diastolic dysfunction. Cardiology was consulted for further admonitions. Coreg was changed to 9.375 mg twice daily. Patient's lisinopril was held in the setting of acute kidney injury. Lasix was initially held given euvolemic at but then was resumed on 10/21/2014 at a dosage of 60 mg daily PO. Digoxin was continued at 0.0625 mg every other day. She was also started on Imdur 30 mg daily. She is arranged to have follow-up with cardiology, and these medication changes were communicated to her husband verbally as well as in print via discharge instructions.  Acute on chronic kidney disease: Patient initially presenting with an elevated creatinine of 2.48 which is above her baseline of around 1.3-1.5. Patient's creatinine trended down throughout her hospitalization 1.6-1.8. Lisinopril was discontinued at the time of discharge, and renal function will be reassessed at her cardiology follow-up later this week.  Type 2 diabetes: A1c 9.6 during her  hospital stay. CBGs trended in the mid to low 100s on Levemir 20 units. Her husband deferred starting insulin therapy following discharge given the multiple changes to her medication regimen, and he agreed that he will follow up with her PCP. She also reported that she has lost her glucometer needs a new one which should be acknowledged at her follow-up visit.   Hypothyroidism: Patient found to have an elevated TSH of 19.7 likely in the setting of amiodarone. Free T4 was within normal limits at 0.83 although hypothyroidism could be contributory to her initial  presentation. Synthroid 25 g daily was started.  Paroxysmal atrial fibrillation: Patient amiodarone was continued at her home dosage. Digoxin and Coreg as noted above. Initial INR on admission of 3.49 warranted holding her Coumadin, but given her risk for CVA, it was decided to decrease her INR goal to 2-2.5. On the day of discharge, she was restarted on Coumadin 1.5 mg and will need to have an INR checked at the time of follow-up to assess dosage changes.  Discharge Vitals:   BP 110/56 mmHg  Pulse 69  Temp(Src) 99.1 F (37.3 C) (Oral)  Resp 18  Ht 5\' 5"  (1.651 m)  Wt 153 lb 14.4 oz (69.809 kg)  BMI 25.61 kg/m2  SpO2 96%  Discharge Labs:  Results for orders placed or performed during the hospital encounter of 10/17/14 (from the past 24 hour(s))  Glucose, capillary     Status: Abnormal   Collection Time: 10/25/14  5:05 PM  Result Value Ref Range   Glucose-Capillary 191 (H) 70 - 99 mg/dL  Glucose, capillary     Status: Abnormal   Collection Time: 10/25/14  8:24 PM  Result Value Ref Range   Glucose-Capillary 200 (H) 70 - 99 mg/dL  Protime-INR     Status: None   Collection Time: 10/26/14  4:56 AM  Result Value Ref Range   Prothrombin Time 15.2 11.6 - 15.2 seconds   INR 1.19 0.00 - 1.49  Comprehensive metabolic panel     Status: Abnormal   Collection Time: 10/26/14  4:56 AM  Result Value Ref Range   Sodium 137 135 - 145 mmol/L    Potassium 3.7 3.5 - 5.1 mmol/L   Chloride 104 101 - 111 mmol/L   CO2 26 22 - 32 mmol/L   Glucose, Bld 137 (H) 70 - 99 mg/dL   BUN 26 (H) 6 - 20 mg/dL   Creatinine, Ser 1.94 (H) 0.44 - 1.00 mg/dL   Calcium 9.0 8.9 - 10.3 mg/dL   Total Protein 5.9 (L) 6.5 - 8.1 g/dL   Albumin 3.1 (L) 3.5 - 5.0 g/dL   AST 13 (L) 15 - 41 U/L   ALT 11 (L) 14 - 54 U/L   Alkaline Phosphatase 62 38 - 126 U/L   Total Bilirubin 0.9 0.3 - 1.2 mg/dL   GFR calc non Af Amer 25 (L) >60 mL/min   GFR calc Af Amer 29 (L) >60 mL/min   Anion gap 7 5 - 15  CBC     Status: Abnormal   Collection Time: 10/26/14  4:56 AM  Result Value Ref Range   WBC 5.2 4.0 - 10.5 K/uL   RBC 3.11 (L) 3.87 - 5.11 MIL/uL   Hemoglobin 9.4 (L) 12.0 - 15.0 g/dL   HCT 28.7 (L) 36.0 - 46.0 %   MCV 92.3 78.0 - 100.0 fL   MCH 30.2 26.0 - 34.0 pg   MCHC 32.8 30.0 - 36.0 g/dL   RDW 15.8 (H) 11.5 - 15.5 %   Platelets 222 150 - 400 K/uL  Glucose, capillary     Status: Abnormal   Collection Time: 10/26/14  7:24 AM  Result Value Ref Range   Glucose-Capillary 121 (H) 70 - 99 mg/dL  Glucose, capillary     Status: Abnormal   Collection Time: 10/26/14 11:20 AM  Result Value Ref Range   Glucose-Capillary 166 (H) 70 - 99 mg/dL   Iron/TIBC/Ferritin/ %Sat    Component Value Date/Time   IRON 86 10/18/2014 0850   TIBC 279 10/18/2014 0850  FERRITIN 18 10/18/2014 0850   IRONPCTSAT 31 10/18/2014 0850   IRONPCTSAT 17* 07/22/2011 1011     Signed: Riccardo Dubin, MD 10/29/2014, 11:50 AM    Services Ordered on Discharge: Home PT Equipment Ordered on Discharge: None

## 2014-10-21 ENCOUNTER — Encounter (HOSPITAL_COMMUNITY)
Admission: EM | Disposition: A | Discharge status: Home Health | Payer: Self-pay | Source: Home / Self Care | Attending: Internal Medicine

## 2014-10-21 ENCOUNTER — Encounter (HOSPITAL_COMMUNITY): Payer: Self-pay | Admitting: Gastroenterology

## 2014-10-21 DIAGNOSIS — D62 Acute posthemorrhagic anemia: Secondary | ICD-10-CM

## 2014-10-21 DIAGNOSIS — R195 Other fecal abnormalities: Secondary | ICD-10-CM

## 2014-10-21 HISTORY — PX: ESOPHAGOGASTRODUODENOSCOPY: SHX5428

## 2014-10-21 LAB — BASIC METABOLIC PANEL
ANION GAP: 6 (ref 5–15)
BUN: 44 mg/dL — AB (ref 6–23)
CALCIUM: 9.5 mg/dL (ref 8.4–10.5)
CHLORIDE: 107 mmol/L (ref 96–112)
CO2: 27 mmol/L (ref 19–32)
CREATININE: 1.76 mg/dL — AB (ref 0.50–1.10)
GFR, EST AFRICAN AMERICAN: 33 mL/min — AB (ref >90)
GFR, EST NON AFRICAN AMERICAN: 28 mL/min — AB (ref >90)
Glucose, Bld: 73 mg/dL (ref 70–99)
POTASSIUM: 3.7 mmol/L (ref 3.5–5.1)
SODIUM: 140 mmol/L (ref 135–145)

## 2014-10-21 LAB — CBC
HCT: 22.1 % — ABNORMAL LOW (ref 36.0–46.0)
HEMATOCRIT: 21.2 % — AB (ref 36.0–46.0)
Hemoglobin: 7.2 g/dL — ABNORMAL LOW (ref 12.0–15.0)
Hemoglobin: 7.5 g/dL — ABNORMAL LOW (ref 12.0–15.0)
MCH: 30.8 pg (ref 26.0–34.0)
MCH: 30.2 pg (ref 26.0–34.0)
MCHC: 34 g/dL (ref 30.0–36.0)
MCHC: 33.9 g/dL (ref 30.0–36.0)
MCV: 90.6 fL (ref 78.0–100.0)
MCV: 89.1 fL (ref 78.0–100.0)
PLATELETS: 171 10*3/uL (ref 150–400)
Platelets: 182 10*3/uL (ref 150–400)
RBC: 2.34 MIL/uL — AB (ref 3.87–5.11)
RBC: 2.48 MIL/uL — ABNORMAL LOW (ref 3.87–5.11)
RDW: 15.3 % (ref 11.5–15.5)
RDW: 14.9 % (ref 11.5–15.5)
WBC: 5.4 10*3/uL (ref 4.0–10.5)
WBC: 5.3 10*3/uL (ref 4.0–10.5)

## 2014-10-21 LAB — PREPARE RBC (CROSSMATCH)

## 2014-10-21 LAB — GLUCOSE, CAPILLARY
GLUCOSE-CAPILLARY: 149 mg/dL — AB (ref 70–99)
GLUCOSE-CAPILLARY: 161 mg/dL — AB (ref 70–99)
Glucose-Capillary: 99 mg/dL (ref 70–99)
Glucose-Capillary: 169 mg/dL — ABNORMAL HIGH (ref 70–99)

## 2014-10-21 LAB — PROTIME-INR
INR: 1.94 — ABNORMAL HIGH (ref 0.00–1.49)
Prothrombin Time: 22.3 seconds — ABNORMAL HIGH (ref 11.6–15.2)

## 2014-10-21 SURGERY — EGD (ESOPHAGOGASTRODUODENOSCOPY)
Anesthesia: Moderate Sedation

## 2014-10-21 MED ORDER — MIDAZOLAM HCL 5 MG/ML IJ SOLN
INTRAMUSCULAR | Status: AC
  Filled 2014-10-21: qty 2

## 2014-10-21 MED ORDER — FENTANYL CITRATE (PF) 100 MCG/2ML IJ SOLN
INTRAMUSCULAR | Status: AC
  Filled 2014-10-21: qty 2

## 2014-10-21 MED ORDER — FENTANYL CITRATE (PF) 100 MCG/2ML IJ SOLN
INTRAMUSCULAR | Status: DC | PRN
  Administered 2014-10-21 (×2): 25 ug via INTRAVENOUS

## 2014-10-21 MED ORDER — SODIUM CHLORIDE 0.9 % IV SOLN
Freq: Once | INTRAVENOUS | Status: AC
  Administered 2014-10-21: 15:00:00 via INTRAVENOUS

## 2014-10-21 MED ORDER — BUTAMBEN-TETRACAINE-BENZOCAINE 2-2-14 % EX AERO
INHALATION_SPRAY | CUTANEOUS | Status: DC | PRN
  Administered 2014-10-21: 2 via TOPICAL

## 2014-10-21 MED ORDER — MIDAZOLAM HCL 5 MG/5ML IJ SOLN
INTRAMUSCULAR | Status: DC | PRN
  Administered 2014-10-21 (×2): 2 mg via INTRAVENOUS
  Administered 2014-10-21: 1 mg via INTRAVENOUS

## 2014-10-21 NOTE — Progress Notes (Signed)
    Progress Note Covering for Dr. Collene Mares   Subjective  no complaints. No overt GI bleeing   Objective   Vital signs in last 24 hours: Temp:  [98 F (36.7 C)-98.7 F (37.1 C)] 98 F (36.7 C) (04/30 9735) Pulse Rate:  [71-73] 73 (04/30 0614) Resp:  [16] 16 (04/29 1320) BP: (109-148)/(55-67) 130/63 mmHg (04/30 0614) SpO2:  [98 %-99 %] 99 % (04/30 0614) Weight:  [156 lb 14.4 oz (71.169 kg)] 156 lb 14.4 oz (71.169 kg) (04/30 0614) Last BM Date: 10/20/14 General:    Pleasant white female in NAD. Family visiting Abdomen:  Soft, nontender and nondistended. Normal bowel sounds. Extremities:  Without edema. Neurologic:  Alert and oriented,  grossly normal neurologically. Psych:  Cooperative. Normal mood and affect.    Lab Results:  Recent Labs  10/19/14 1858 10/20/14 0706 10/21/14 0450  WBC 6.2 5.9 5.4  HGB 7.7* 7.6* 7.2*  HCT 22.5* 22.5* 21.2*  PLT 164 169 182   BMET  Recent Labs  10/19/14 0253 10/20/14 0706 10/21/14 0450  NA 138 140 140  K 4.2 4.2 3.7  CL 103 106 107  CO2 26 27 27   GLUCOSE 243* 209* 73  BUN 54* 50* 44*  CREATININE 2.10* 1.87* 1.76*  CALCIUM 9.1 9.5 9.5  PT/INR  Recent Labs  10/20/14 1928 10/21/14 0450  LABPROT 23.4* 22.3*  INR 2.06* 1.94*     Assessment / Plan:   60. 69 year old female with iron deficiency anemia, occasional rectal bleeding on coumadin.. Colonoscopy last year in [place]. Patient seen this admission by Dr. Collene Mares. EGD planned but was postponed. Anemia felt to be multi-factorial ( thyroid disease, CKD, ? MDS). No active GI bleeding but heme positive. Epo started, iron infusion given. Patient has gotten 3 units of blood without appropriate hgb response. Hgb down from 7.6 to 7.2 this am. Getting another unit of blood today. Coumadin on hold. INR 1.94 today. Given persistent anemia and heme positive stool will proceed with EGD as originally planned. The benefits, risks, and potential complications of EGD with possible biopsies were  discussed with the patient and she agrees to proceed.  We can do it later today, will make NPO.   2. Paroxysmal a-fib / DM / thyroid disease / acute on chronic kidney disease.     LOS: 4 days   Tye Savoy  10/21/2014, 10:53 AM

## 2014-10-21 NOTE — Progress Notes (Signed)
Current issues revolve mostly around anemia and suspected bleeding. Too soon to make additional changes to HF meds.

## 2014-10-21 NOTE — Progress Notes (Signed)
  Date: 10/21/2014  Patient name: [name]  Medical record number: [id number]  Date of birth: 1946-01-20   This patient's plan of care was discussed with the house staff. Please see Dr. Trudee Kuster note for complete details. I concur with his findings.  Agree with re-consultation with GI.  I am concerned that she is not having adequate response to blood transfusion and continues to have a slow drop in Hgb daily.  Possibly phlebotomy related and low reserve, however, blood loss from the GI tract cannot be totally ruled out.  PRBC X 1 unit today.    Sid Falcon, MD 10/21/2014, 10:55 AM

## 2014-10-21 NOTE — Progress Notes (Signed)
No bleeding source was seen by upper endoscopy.  Recommend no further GI workup in the absence of overt GI bleeding.  Signing off

## 2014-10-21 NOTE — H&P (View-Only) (Signed)
    Progress Note Covering for Dr. Collene Mares   Subjective  no complaints. No overt GI bleeing   Objective   Vital signs in last 24 hours: Temp:  [98 F (36.7 C)-98.7 F (37.1 C)] 98 F (36.7 C) (04/30 9390) Pulse Rate:  [71-73] 73 (04/30 0614) Resp:  [16] 16 (04/29 1320) BP: (109-148)/(55-67) 130/63 mmHg (04/30 0614) SpO2:  [98 %-99 %] 99 % (04/30 0614) Weight:  [156 lb 14.4 oz (71.169 kg)] 156 lb 14.4 oz (71.169 kg) (04/30 0614) Last BM Date: 10/20/14 General:    Pleasant white female in NAD. Family visiting Abdomen:  Soft, nontender and nondistended. Normal bowel sounds. Extremities:  Without edema. Neurologic:  Alert and oriented,  grossly normal neurologically. Psych:  Cooperative. Normal mood and affect.    Lab Results:  Recent Labs  10/19/14 1858 10/20/14 0706 10/21/14 0450  WBC 6.2 5.9 5.4  HGB 7.7* 7.6* 7.2*  HCT 22.5* 22.5* 21.2*  PLT 164 169 182   BMET  Recent Labs  10/19/14 0253 10/20/14 0706 10/21/14 0450  NA 138 140 140  K 4.2 4.2 3.7  CL 103 106 107  CO2 26 27 27   GLUCOSE 243* 209* 73  BUN 54* 50* 44*  CREATININE 2.10* 1.87* 1.76*  CALCIUM 9.1 9.5 9.5  PT/INR  Recent Labs  10/20/14 1928 10/21/14 0450  LABPROT 23.4* 22.3*  INR 2.06* 1.94*     Assessment / Plan:   67. 69 year old female with iron deficiency anemia, occasional rectal bleeding on coumadin.. Colonoscopy last year in [place]. Patient seen this admission by Dr. Collene Mares. EGD planned but was postponed. Anemia felt to be multi-factorial ( thyroid disease, CKD, ? MDS). No active GI bleeding but heme positive. Epo started, iron infusion given. Patient has gotten 3 units of blood without appropriate hgb response. Hgb down from 7.6 to 7.2 this am. Getting another unit of blood today. Coumadin on hold. INR 1.94 today. Given persistent anemia and heme positive stool will proceed with EGD as originally planned. The benefits, risks, and potential complications of EGD with possible biopsies were  discussed with the patient and she agrees to proceed.  We can do it later today, will make NPO.   2. Paroxysmal a-fib / DM / thyroid disease / acute on chronic kidney disease.     LOS: 4 days   Tye Savoy  10/21/2014, 10:53 AM

## 2014-10-21 NOTE — Progress Notes (Addendum)
Subjective:  Patient states that she continues to feel improved although describes her overall course in the hospital as waxing and waning with possible correlation with her blood transfusions. She continues to deny noticing any significant amount of blood loss in her bowel movements.   Objective: Vital signs in last 24 hours: Filed Vitals:   10/20/14 0500 10/20/14 1320 10/20/14 2106 10/21/14 0614  BP: 141/60 148/67 109/55 130/63  Pulse:  71 72 73  Temp: 98.6 F (37 C) 98.2 F (36.8 C) 98.7 F (37.1 C) 98 F (36.7 C)  TempSrc: Oral Oral Oral Oral  Resp: 18 16    Height:      Weight: 154 lb 14.4 oz (70.262 kg)   156 lb 14.4 oz (71.169 kg)  SpO2: 99% 99% 98% 99%   Weight change: 2 lb (0.907 kg)  Intake/Output Summary (Last 24 hours) at 10/21/14 0754 Last data filed at 10/20/14 2200  Gross per 24 hour  Intake    600 ml  Output   1001 ml  Net   -401 ml    General: resting in bed, in no acute distress HEENT: PERRL, EOMI, no scleral icterus, persistent conjunctival pallor Cardiac: RRR, no rubs or gallops, 2/6 systolic murmur Pulm: clear to auscultation bilaterally, moving normal volumes of air Abd: soft, nontender, nondistended, BS present Ext: warm and well perfused, trace bilateral lower extremity edema Neuro: alert and oriented X3, cranial nerves II-XII grossly intact Skin: no rashes or lesions noted Psych: appropriate affect  Lab Results: Basic Metabolic Panel:  Recent Labs Lab 10/20/14 0706 10/21/14 0450  NA 140 140  K 4.2 3.7  CL 106 107  CO2 27 27  GLUCOSE 209* 73  BUN 50* 44*  CREATININE 1.87* 1.76*  CALCIUM 9.5 9.5   Liver Function Tests:  Recent Labs Lab 10/17/14 0915 10/17/14 1000  AST 14 19  ALT 11 13  ALKPHOS 71 73  BILITOT 0.5 0.8  PROT 6.8 7.0  ALBUMIN 3.7 3.9   No results for input(s): LIPASE, AMYLASE in the last 168 hours. No results for input(s): AMMONIA in the last 168 hours. CBC:  Recent Labs Lab 10/17/14 1000   10/20/14 0706 10/21/14 0450  WBC 9.0  < > 5.9 5.4  NEUTROABS 7.5  --   --   --   HGB 6.7*  < > 7.6* 7.2*  HCT 19.8*  < > 22.5* 21.2*  MCV 90.0  < > 88.6 90.6  PLT 200  < > 169 182  < > = values in this interval not displayed. Cardiac Enzymes:  Recent Labs Lab 10/17/14 1000  TROPONINI 0.03   BNP: No results for input(s): PROBNP in the last 168 hours. D-Dimer: No results for input(s): DDIMER in the last 168 hours. CBG:  Recent Labs Lab 10/19/14 2050 10/20/14 0742 10/20/14 1213 10/20/14 1708 10/20/14 2110 10/20/14 2219  GLUCAP 234* 210* 233* 190* 151* 142*   Hemoglobin A1C:  Recent Labs Lab 10/17/14 1909  HGBA1C 9.6*   Fasting Lipid Panel: No results for input(s): CHOL, HDL, LDLCALC, TRIG, CHOLHDL, LDLDIRECT in the last 168 hours. Thyroid Function Tests:  Recent Labs Lab 10/17/14 0915  TSH 19.740*  FREET4 0.83  T3FREE 2.3   Coagulation:  Recent Labs Lab 10/18/14 0850 10/19/14 0253 10/20/14 1928 10/21/14 0450  LABPROT 35.3* 37.3* 23.4* 22.3*  INR 3.49* 3.74* 2.06* 1.94*   Anemia Panel:  Recent Labs Lab 10/18/14 0850  VITAMINB12 511  FOLATE 8.4  FERRITIN 18  TIBC 279  IRON  86  RETICCTPCT 2.1   Urine Drug Screen: Drugs of Abuse  No results found for: LABOPIA, COCAINSCRNUR, LABBENZ, AMPHETMU, THCU, LABBARB  Alcohol Level: No results for input(s): ETH in the last 168 hours. Urinalysis:  Recent Labs Lab 10/17/14 1050  COLORURINE YELLOW  LABSPEC 1.012  PHURINE 6.5  GLUCOSEU >1000*  HGBUR NEGATIVE  BILIRUBINUR NEGATIVE  KETONESUR NEGATIVE  PROTEINUR NEGATIVE  UROBILINOGEN 0.2  NITRITE NEGATIVE  LEUKOCYTESUR NEGATIVE   Micro Results: Recent Results (from the past 240 hour(s))  MRSA PCR Screening     Status: Abnormal   Collection Time: 10/17/14  6:09 PM  Result Value Ref Range Status   MRSA by PCR POSITIVE (A) NEGATIVE Final    Comment:        The GeneXpert MRSA Assay (FDA approved for NASAL specimens only), is one component  of a comprehensive MRSA colonization surveillance program. It is not intended to diagnose MRSA infection nor to guide or monitor treatment for MRSA infections. RESULT CALLED TO, READ BACK BY AND VERIFIED WITH: Carley Hammed RN [id number] 10/17/14 A BROWNING    Studies/Results: No results found. Medications: I have reviewed the patient's current medications. Scheduled Meds: . sodium chloride   Intravenous Once  . amiodarone  200 mg Oral Daily  . carvedilol  6.25 mg Oral BID WC  . Chlorhexidine Gluconate Cloth  6 each Topical Q0600  . darbepoetin (ARANESP) injection - NON-DIALYSIS  40 mcg Subcutaneous Q Fri-1800  . digoxin  0.0625 mg Oral QODAY  . docusate sodium  100 mg Oral Daily  . feeding supplement (ENSURE ENLIVE)  237 mL Oral BID BM  . furosemide  60 mg Oral Daily  . hydrALAZINE  12.5 mg Oral TID  . insulin aspart  0-15 Units Subcutaneous TID WC  . insulin aspart  0-5 Units Subcutaneous QHS  . insulin detemir  22 Units Subcutaneous QHS  . isosorbide mononitrate  30 mg Oral Daily  . levothyroxine  25 mcg Oral QAC breakfast  . living well with diabetes book   Does not apply Once  . mupirocin ointment  1 application Nasal BID  . pantoprazole (PROTONIX) IV  40 mg Intravenous Q24H  . polyethylene glycol  17 g Oral Daily  . pravastatin  80 mg Oral QHS  . Warfarin - Pharmacist Dosing Inpatient   Does not apply q1800   Continuous Infusions:   PRN Meds:.acetaminophen **OR** acetaminophen, ondansetron **OR** ondansetron (ZOFRAN) IV, oxyCODONE-acetaminophen, simethicone Assessment/Plan: Principal Problem:   Anemia, normocytic normochromic Active Problems:   Cardiomyopathy, nonischemic   Diabetes mellitus, type 2   CHF (congestive heart failure)   Hypotension   Patient is a 69 year old with a history of symptomatic anemia, chronic kidney disease, type 2 diabetes, nonischemic cardiomyopathy status post ICD placement, paroxysmal atrial fibrillation who is admitted for symptomatic  anemia.  Symptomatic normocytic anemia: Patient's hemoglobin trended down from 7.6 to 7.2 this morning. She is still reporting persistent symptoms though possibly improved over the interval. Patient is now on hospital day four s/p 3 units of packed red blood cells. Her initial hemoglobin upon presentation was 6.7 which currently puts her at around 2-3 g/dL shy of appropriate response to transfusion. This also suggests that there is a 0.5 to 1 g/dL loss of red blood cells of uncertain etiology per day at this point. Although patient's anemia is thought to be multifactorial with components of hypothyroidism and chronic kidney disease (multiple myeloma workup negative and MDS still on the differential), ongoing blood loss (less likely to  be hemolysis) is of more concern today.  -Reconsult GI. -Another 1 unit of PRBC today.   -Hold warfarin pending GI consultation.   Nonischemic cardiomyopathy: Patient remains euvolemic. Repeat echocardiogram from this admission showing a stable ejection fraction of 15-20% with grade 2 diastolic dysfunction. Status post placement of a [employer] CRT-D device placed in August 2014. Interrogation showing no evidence of atrial fibrillation or ventricular tachycardia.  At home, patient is on Coreg 12.5 mg twice a day, lisinopril 2.5 mg daily. Patient has not an LVAD candidate. -Appreciate cardiology recommendations -Increase carvedilol to 6.25 mg BID.  -Lasix restarted at a lower dose of 60 mg PO.  -Continue to hold lisinopril in the setting of renal insufficiency.  -Continue digoxin to every other day dosing given a level of 1.1 yesterday. -No signs of fluid overload status post blood transfusion.  Acute on chronic kidney disease: Creatinine trending down to 1.76 from 1.87, closer to her baseline of a presumed 1.3-1.5. No concurrent electrolyte abnormalities. Multiple myeloma panel negative.  -Continue to monitor. -Holding lisinopril. -Lasix restarted at 60 mg daily  PO  Type 2 diabetes: CBGs improved at 142-233. Patient is not on any diabetes medications at home. -Continue with sliding scale insulin -Continue levemir to 20 units daily.  Hypothyroidism: TSH of 19.7. Free T4 within normal limits of 0.83 although patient may be symptomatic from her thyroid state which argues against subclinical hypothyroidism. At home, patient is not on any thyroid supplementation.  -Synthroid 25 g daily  Paroxysmal atrial fibrillation: Last cardioverted in December 2014. Interrogation in cardiologist clinic unremarkable for any atrial fibrillation or ventricular tachycardia. At home, patient is on amiodarone 200 mg daily, carvedilol 12.5 mg twice a day, digoxin 0.5 mg daily, and Coumadin. -Continue home amiodarone and digoxin at reduced dose as above. -Increase to carvedilol 6.25 mg BID. -Warfarin resumed yesterday, INR of 1.94 this morning. Hold warfarin pending GI consultation.   Dispo: Disposition is deferred at this time, awaiting improvement of current medical problems. Anticipated discharge in approximately 1-3 day(s).   The patient does have a current PCP Lemmie Evens, MD) and does need an [facility] hospital follow-up appointment after discharge.  The patient does not have transportation limitations that hinder transportation to clinic appointments.   LOS: 4 days   Services Needed at time of discharge: Y = Yes, Blank = No PT:   OT:   RN:   Equipment:   Other:    Luan Moore, MD 10/21/2014, 7:54 AM

## 2014-10-21 NOTE — Op Note (Signed)
[facility] [place], [id number]   ENDOSCOPY PROCEDURE REPORT  PATIENT: [name], [name]  MR#: [id number] BIRTHDATE: 1945/07/25 , 68  yrs. old GENDER: female ENDOSCOPIST: Inda Castle, MD REFERRED BY: PROCEDURE DATE:  10/21/2014 PROCEDURE:  EGD, diagnostic ASA CLASS:     Class II INDICATIONS:  occult blood positive. MEDICATIONS: Versed 5 mg IV and Fentanyl 50 mcg IV TOPICAL ANESTHETIC: Cetacaine Spray  DESCRIPTION OF PROCEDURE: After the risks benefits and alternatives of the procedure were thoroughly explained, informed consent was obtained.  The Pentax Gastroscope [id number] endoscope was introduced through the mouth and advanced to the second portion of the duodenum , Without limitations.  The instrument was slowly withdrawn as the mucosa was fully examined.    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  The stricture was easily traversable. Except for the findings listed, the EGD was otherwise normal. Retroflexed views revealed no abnormalities.     The scope was then withdrawn from the patient and the procedure completed.  COMPLICATIONS: There were no immediate complications.  ENDOSCOPIC IMPRESSION: 1.   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   EGD was otherwise normal  RECOMMENDATIONS: Hold further GI workup unless pt develops overt GI bleeding   REPEAT EXAM:  eSigned:  Inda Castle, MD 10/21/2014 5:00 PM    CC:

## 2014-10-21 NOTE — Interval H&P Note (Signed)
History and Physical Interval Note:  10/21/2014 4:21 PM  [name]  has presented today for surgery, with the diagnosis of anemia, heme positive stool  The various methods of treatment have been discussed with the patient and family. After consideration of risks, benefits and other options for treatment, the patient has consented to  Procedure(s): ESOPHAGOGASTRODUODENOSCOPY (EGD) (N/A) as a surgical intervention .  The patient's history has been reviewed, patient examined, no change in status, stable for surgery.  I have reviewed the patient's chart and labs.  Questions were answered to the patient's satisfaction  The recent H&P (dated **10/21/14*) was reviewed, the patient was examined and there is no change in the patients condition since that H&P was completed.   Erskine Emery  10/21/2014, 4:22 PM    Erskine Emery

## 2014-10-22 DIAGNOSIS — I5022 Chronic systolic (congestive) heart failure: Secondary | ICD-10-CM

## 2014-10-22 LAB — GLUCOSE, CAPILLARY
Glucose-Capillary: 66 mg/dL — ABNORMAL LOW (ref 70–99)
Glucose-Capillary: 121 mg/dL — ABNORMAL HIGH (ref 70–99)
Glucose-Capillary: 211 mg/dL — ABNORMAL HIGH (ref 70–99)
Glucose-Capillary: 192 mg/dL — ABNORMAL HIGH (ref 70–99)

## 2014-10-22 LAB — PROTIME-INR
INR: 1.88 — AB (ref 0.00–1.49)
INR: 1.62 — ABNORMAL HIGH (ref 0.00–1.49)
PROTHROMBIN TIME: 21.7 s — AB (ref 11.6–15.2)
PROTHROMBIN TIME: 19.4 s — AB (ref 11.6–15.2)

## 2014-10-22 LAB — CBC
HCT: 23.8 % — ABNORMAL LOW (ref 36.0–46.0)
HEMATOCRIT: 23.2 % — AB (ref 36.0–46.0)
HEMATOCRIT: 28.8 % — AB (ref 36.0–46.0)
HEMOGLOBIN: 9.7 g/dL — AB (ref 12.0–15.0)
Hemoglobin: 7.7 g/dL — ABNORMAL LOW (ref 12.0–15.0)
Hemoglobin: 7.9 g/dL — ABNORMAL LOW (ref 12.0–15.0)
MCH: 29.7 pg (ref 26.0–34.0)
MCH: 29.9 pg (ref 26.0–34.0)
MCH: 30 pg (ref 26.0–34.0)
MCHC: 33.2 g/dL (ref 30.0–36.0)
MCHC: 33.2 g/dL (ref 30.0–36.0)
MCHC: 33.7 g/dL (ref 30.0–36.0)
MCV: 89.6 fL (ref 78.0–100.0)
MCV: 90.2 fL (ref 78.0–100.0)
MCV: 89.2 fL (ref 78.0–100.0)
PLATELETS: 196 10*3/uL (ref 150–400)
Platelets: 181 10*3/uL (ref 150–400)
Platelets: 213 10*3/uL (ref 150–400)
RBC: 2.59 MIL/uL — AB (ref 3.87–5.11)
RBC: 2.64 MIL/uL — ABNORMAL LOW (ref 3.87–5.11)
RBC: 3.23 MIL/uL — ABNORMAL LOW (ref 3.87–5.11)
RDW: 15.1 % (ref 11.5–15.5)
RDW: 15.3 % (ref 11.5–15.5)
RDW: 14.9 % (ref 11.5–15.5)
WBC: 5.6 10*3/uL (ref 4.0–10.5)
WBC: 6 10*3/uL (ref 4.0–10.5)
WBC: 6.7 10*3/uL (ref 4.0–10.5)

## 2014-10-22 LAB — BASIC METABOLIC PANEL
ANION GAP: 8 (ref 5–15)
BUN: 38 mg/dL — ABNORMAL HIGH (ref 6–20)
CALCIUM: 9 mg/dL (ref 8.9–10.3)
CHLORIDE: 106 mmol/L (ref 101–111)
CO2: 26 mmol/L (ref 22–32)
Creatinine, Ser: 1.96 mg/dL — ABNORMAL HIGH (ref 0.44–1.00)
GFR calc Af Amer: 29 mL/min — ABNORMAL LOW (ref >60)
GFR calc non Af Amer: 25 mL/min — ABNORMAL LOW (ref >60)
Glucose, Bld: 63 mg/dL — ABNORMAL LOW (ref 70–99)
Potassium: 3.7 mmol/L (ref 3.5–5.1)
SODIUM: 140 mmol/L (ref 135–145)

## 2014-10-22 LAB — PREPARE RBC (CROSSMATCH)

## 2014-10-22 MED ORDER — WARFARIN - PHARMACIST DOSING INPATIENT: Freq: Every day | Status: DC

## 2014-10-22 MED ORDER — SODIUM CHLORIDE 0.9 % IV SOLN: Freq: Once | INTRAVENOUS | Status: DC

## 2014-10-22 MED ORDER — SODIUM CHLORIDE 0.9 % IV SOLN
Freq: Once | INTRAVENOUS | Status: AC
  Administered 2014-10-22: 19:00:00 via INTRAVENOUS

## 2014-10-22 MED ORDER — PANTOPRAZOLE SODIUM 40 MG IV SOLR
40.0000 mg | INTRAVENOUS | Status: DC
  Administered 2014-10-22 – 2014-10-26 (×5): 40 mg via INTRAVENOUS
  Filled 2014-10-22 (×6): qty 40

## 2014-10-22 MED ORDER — WARFARIN SODIUM 2.5 MG PO TABS: 1.2500 mg | ORAL_TABLET | Freq: Once | ORAL | Status: DC

## 2014-10-22 NOTE — Progress Notes (Signed)
DAILY PROGRESS NOTE  Subjective:  Upper GI shows no acute bleeding source. D/w Dr. [name] and he plans to sign-off. Anemia has minimally responded to transfusion and appears to be stable. ?about orthostatic hypotension overnight. She is positive, but remains on daily diuretic. Overall, weight down 6 lbs since admission.  Objective:  Temp:  [98 F (36.7 C)-98.8 F (37.1 C)] 98.3 F (36.8 C) (05/01 0508) Pulse Rate:  [69-71] 69 (05/01 0508) Resp:  [15-20] 18 (05/01 0508) BP: (105-155)/(49-72) 105/59 mmHg (05/01 0508) SpO2:  [93 %-100 %] 98 % (05/01 0508) Weight:  [149 lb 14.6 oz (68 kg)] 149 lb 14.6 oz (68 kg) (05/01 0508) Weight change: -6 lb 15.8 oz (-3.17 kg)  Intake/Output from previous day: 04/30 0701 - 05/01 0700 In: 630 [P.O.:180; Blood:250] Out: -   Intake/Output from this shift: Total I/O In: 600 [P.O.:600] Out: -   Medications: Current Facility-Administered Medications  Medication Dose Route Frequency Provider Last Rate Last Dose  . 0.9 %  sodium chloride infusion   Intravenous Once Luan Moore, MD 10 mL/hr at 10/19/14 0800    . 0.9 %  sodium chloride infusion   Intravenous Once Wilber Oliphant, MD      . acetaminophen (TYLENOL) tablet 650 mg  650 mg Oral Q6H PRN Jones Bales, MD       Or  . acetaminophen (TYLENOL) suppository 650 mg  650 mg Rectal Q6H PRN Jones Bales, MD      . amiodarone (PACERONE) tablet 200 mg  200 mg Oral Daily Jones Bales, MD   200 mg at 10/22/14 7341  . carvedilol (COREG) tablet 6.25 mg  6.25 mg Oral BID WC Larey Dresser, MD   6.25 mg at 10/22/14 9379  . Darbepoetin Alfa (ARANESP) injection 40 mcg  40 mcg Subcutaneous Q [id number] Madilyn Fireman, MD   40 mcg at 10/20/14 1828  . digoxin (LANOXIN) tablet 0.0625 mg  0.0625 mg Oral QODAY Larey Dresser, MD   0.0625 mg at 10/22/14 5329  . docusate sodium (COLACE) capsule 100 mg  100 mg Oral Daily Jones Bales, MD   100 mg at 10/22/14 0932  . feeding supplement (ENSURE  ENLIVE) (ENSURE ENLIVE) liquid 237 mL  237 mL Oral BID BM Madilyn Fireman, MD   237 mL at 10/20/14 1500  . furosemide (LASIX) tablet 60 mg  60 mg Oral Daily Larey Dresser, MD   60 mg at 10/22/14 9242  . hydrALAZINE (APRESOLINE) tablet 12.5 mg  12.5 mg Oral TID Larey Dresser, MD   12.5 mg at 10/22/14 0920  . insulin aspart (novoLOG) injection 0-15 Units  0-15 Units Subcutaneous TID WC Jones Bales, MD   2 Units at 10/21/14 1817  . insulin aspart (novoLOG) injection 0-5 Units  0-5 Units Subcutaneous QHS Charlesetta Shanks, MD   3 Units at 10/19/14 2056  . insulin detemir (LEVEMIR) injection 22 Units  22 Units Subcutaneous QHS Jones Bales, MD   22 Units at 10/21/14 2123  . isosorbide mononitrate (IMDUR) 24 hr tablet 30 mg  30 mg Oral Daily Larey Dresser, MD   30 mg at 10/22/14 6834  . levothyroxine (SYNTHROID, LEVOTHROID) tablet 25 mcg  25 mcg Oral QAC breakfast Jones Bales, MD   25 mcg at 10/22/14 [phone]  . living well with diabetes book MISC   Does not apply Once Madilyn Fireman, MD      . ondansetron (ZOFRAN) tablet 4 mg  4 mg Oral Q6H PRN Jones Bales, MD       Or  . ondansetron (ZOFRAN) injection 4 mg  4 mg Intravenous Q6H PRN Jones Bales, MD      . oxyCODONE-acetaminophen (PERCOCET/ROXICET) 5-325 MG per tablet 1 tablet  1 tablet Oral Q6H PRN Jones Bales, MD   1 tablet at 10/22/14 0932  . pantoprazole (PROTONIX) injection 40 mg  40 mg Intravenous Q24H Wilber Oliphant, MD   40 mg at 10/22/14 0925  . polyethylene glycol (MIRALAX / GLYCOLAX) packet 17 g  17 g Oral Daily Langley Gauss Moding, MD   17 g at 10/22/14 0932  . pravastatin (PRAVACHOL) tablet 80 mg  80 mg Oral QHS Jones Bales, MD   80 mg at 10/21/14 2123  . simethicone (MYLICON) chewable tablet 80 mg  80 mg Oral QID PRN Charlesetta Shanks, MD   80 mg at 10/19/14 2215    Physical Exam: General appearance: alert and no distress Lungs: diminished breath sounds bilaterally Heart: regular rate and rhythm, S1, S2  normal, no murmur, click, rub or gallop Extremities: extremities normal, atraumatic, no cyanosis or edema  Lab Results: Results for orders placed or performed during the hospital encounter of 10/17/14 (from the past 48 hour(s))  Glucose, capillary     Status: Abnormal   Collection Time: 10/20/14 12:13 PM  Result Value Ref Range   Glucose-Capillary 233 (H) 70 - 99 mg/dL   Comment 1 Notify RN   Glucose, capillary     Status: Abnormal   Collection Time: 10/20/14  5:08 PM  Result Value Ref Range   Glucose-Capillary 190 (H) 70 - 99 mg/dL   Comment 1 Notify RN   Protime-INR     Status: Abnormal   Collection Time: 10/20/14  7:28 PM  Result Value Ref Range   Prothrombin Time 23.4 (H) 11.6 - 15.2 seconds   INR 2.06 (H) 0.00 - 1.49  Glucose, capillary     Status: Abnormal   Collection Time: 10/20/14  9:10 PM  Result Value Ref Range   Glucose-Capillary 151 (H) 70 - 99 mg/dL  Glucose, capillary     Status: Abnormal   Collection Time: 10/20/14 10:19 PM  Result Value Ref Range   Glucose-Capillary 142 (H) 70 - 99 mg/dL  CBC     Status: Abnormal   Collection Time: 10/21/14  4:50 AM  Result Value Ref Range   WBC 5.4 4.0 - 10.5 K/uL   RBC 2.34 (L) 3.87 - 5.11 MIL/uL   Hemoglobin 7.2 (L) 12.0 - 15.0 g/dL   HCT 21.2 (L) 36.0 - 46.0 %   MCV 90.6 78.0 - 100.0 fL   MCH 30.8 26.0 - 34.0 pg   MCHC 34.0 30.0 - 36.0 g/dL   RDW 15.3 11.5 - 15.5 %   Platelets 182 150 - 400 K/uL  Basic metabolic panel     Status: Abnormal   Collection Time: 10/21/14  4:50 AM  Result Value Ref Range   Sodium 140 135 - 145 mmol/L   Potassium 3.7 3.5 - 5.1 mmol/L   Chloride 107 96 - 112 mmol/L   CO2 27 19 - 32 mmol/L   Glucose, Bld 73 70 - 99 mg/dL   BUN 44 (H) 6 - 23 mg/dL   Creatinine, Ser 1.76 (H) 0.50 - 1.10 mg/dL   Calcium 9.5 8.4 - 10.5 mg/dL   GFR calc non Af Amer 28 (L) >90 mL/min   GFR calc Af Amer 33 (  L) >90 mL/min    Comment: (NOTE) The eGFR has been calculated using the CKD EPI equation. This  calculation has not been validated in all clinical situations. eGFR's persistently <90 mL/min signify possible Chronic Kidney Disease.    Anion gap 6 5 - 15  Protime-INR     Status: Abnormal   Collection Time: 10/21/14  4:50 AM  Result Value Ref Range   Prothrombin Time 22.3 (H) 11.6 - 15.2 seconds   INR 1.94 (H) 0.00 - 1.49  Glucose, capillary     Status: None   Collection Time: 10/21/14  8:00 AM  Result Value Ref Range   Glucose-Capillary 99 70 - 99 mg/dL  Prepare RBC     Status: None   Collection Time: 10/21/14  8:51 AM  Result Value Ref Range   Order Confirmation ORDER PROCESSED BY BLOOD BANK   Type and screen     Status: None (Preliminary result)   Collection Time: 10/21/14 11:05 AM  Result Value Ref Range   ABO/RH(D) A NEG    Antibody Screen NEG    Sample Expiration 10/24/2014    Unit Number [id number]    Blood Component Type RBC CPDA1, LR    Unit division 00    Status of Unit ISSUED,FINAL    Transfusion Status OK TO TRANSFUSE    Crossmatch Result Compatible    Unit Number [id number]    Blood Component Type RED CELLS,LR    Unit division 00    Status of Unit ALLOCATED    Transfusion Status OK TO TRANSFUSE    Crossmatch Result Compatible   Glucose, capillary     Status: Abnormal   Collection Time: 10/21/14 11:45 AM  Result Value Ref Range   Glucose-Capillary 169 (H) 70 - 99 mg/dL  Glucose, capillary     Status: Abnormal   Collection Time: 10/21/14  6:17 PM  Result Value Ref Range   Glucose-Capillary 149 (H) 70 - 99 mg/dL  CBC     Status: Abnormal   Collection Time: 10/21/14  9:20 PM  Result Value Ref Range   WBC 5.3 4.0 - 10.5 K/uL   RBC 2.48 (L) 3.87 - 5.11 MIL/uL   Hemoglobin 7.5 (L) 12.0 - 15.0 g/dL   HCT 22.1 (L) 36.0 - 46.0 %   MCV 89.1 78.0 - 100.0 fL   MCH 30.2 26.0 - 34.0 pg   MCHC 33.9 30.0 - 36.0 g/dL   RDW 14.9 11.5 - 15.5 %   Platelets 171 150 - 400 K/uL  Glucose, capillary     Status: Abnormal   Collection Time: 10/21/14  9:22 PM    Result Value Ref Range   Glucose-Capillary 161 (H) 70 - 99 mg/dL  Prepare RBC     Status: None   Collection Time: 10/22/14  1:45 AM  Result Value Ref Range   Order Confirmation ORDER PROCESSED BY BLOOD BANK   Protime-INR     Status: Abnormal   Collection Time: 10/22/14  5:35 AM  Result Value Ref Range   Prothrombin Time 21.7 (H) 11.6 - 15.2 seconds   INR 1.88 (H) 0.00 - 1.49  CBC     Status: Abnormal   Collection Time: 10/22/14  5:35 AM  Result Value Ref Range   WBC 5.6 4.0 - 10.5 K/uL   RBC 2.59 (L) 3.87 - 5.11 MIL/uL   Hemoglobin 7.7 (L) 12.0 - 15.0 g/dL   HCT 23.2 (L) 36.0 - 46.0 %   MCV 89.6 78.0 - 100.0 fL  MCH 29.7 26.0 - 34.0 pg   MCHC 33.2 30.0 - 36.0 g/dL   RDW 15.1 11.5 - 15.5 %   Platelets 181 150 - 400 K/uL  Basic metabolic panel     Status: Abnormal   Collection Time: 10/22/14  5:35 AM  Result Value Ref Range   Sodium 140 135 - 145 mmol/L   Potassium 3.7 3.5 - 5.1 mmol/L   Chloride 106 101 - 111 mmol/L   CO2 26 22 - 32 mmol/L   Glucose, Bld 63 (L) 70 - 99 mg/dL   BUN 38 (H) 6 - 20 mg/dL   Creatinine, Ser 1.96 (H) 0.44 - 1.00 mg/dL   Calcium 9.0 8.9 - 10.3 mg/dL   GFR calc non Af Amer 25 (L) >60 mL/min   GFR calc Af Amer 29 (L) >60 mL/min    Comment: (NOTE) The eGFR has been calculated using the CKD EPI equation. This calculation has not been validated in all clinical situations. eGFR's persistently <90 mL/min signify possible Chronic Kidney Disease.    Anion gap 8 5 - 15  Glucose, capillary     Status: Abnormal   Collection Time: 10/22/14  8:00 AM  Result Value Ref Range   Glucose-Capillary 66 (L) 70 - 99 mg/dL    Imaging: No results found.  Assessment:  1. Principal Problem: 2.   Anemia, normocytic normochromic 3. Active Problems: 4.   Cardiomyopathy, nonischemic 5.   Diabetes mellitus, type 2 6.   CHF (congestive heart failure) 7.   Hypotension 8.   Acute posthemorrhagic anemia 9.   Nonspecific abnormal finding in stool  contents 10.   Plan:  BP soft, but seems to be tolerating afterload reduction with hydralazine/nitrates. No room to uptitrate medications at this time. Would continue current dose diuretic. Agree that aranesp would ultimately be the most helpful for her anemia.  Time Spent Directly with Patient:  15 minutes  Length of Stay:  LOS: 5 days   Pixie Casino, MD, [facility] Attending Cardiologist [place], [place] 10/22/2014, 12:12 PM

## 2014-10-22 NOTE — Progress Notes (Signed)
Patient had black tarry BM during shift with bright red blood present. MD notified (weekend pager). CBC ordered.  Informed MD of patient's known low Hgb, previous bloody BM charted yesterday, and prior blood transfusions administered. Will continue to monitor.

## 2014-10-22 NOTE — Progress Notes (Signed)
Dr. Helane Gunther was notified of orthostatic vital signs and order to hold off on transfusing unit of blood at this time. [facility] [employer]

## 2014-10-22 NOTE — Progress Notes (Signed)
Subjective: This morning, her husband was at bedside. 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 She has lived in [place] with her husband since 1990 in a trailer home. They are received well water through their plumbing. She denies any herbal or nutritional supplements that she has taken recently. She reports growing her on vegetables which include peas, onions, reports eating a variety of foods, including, green potatoes, chicken, fish, orange juice, milk, sweet tea, desserts. She has been off cigarettes for the last 8 months but smoked for an interval of 10 years during which a pack would last for 2 weeks; she denies any excessive alcohol intake or prior history of illicit drug use. She does report having a colonoscopy several years ago during which she was found to have hemorrhoids and polyps. Her PCP is Dr. [facility]].  Objective: Vital signs in last 24 hours: Filed Vitals:   10/22/14 0214 10/22/14 0216 10/22/14 0220 10/22/14 0508  BP: 126/70 136/60 130/63 105/59  Pulse:   70 69  Temp:    98.3 F (36.8 C)  TempSrc:    Oral  Resp: _0 Height:      Weight:    149 lb 14.6 oz (68 kg)  SpO2: 99% 99% 93% 98%   Weight change: -6 lb 15.8 oz (-3.17 kg)  Intake/Output Summary (Last 24 hours) at 10/22/14 1412 Last data filed at 10/22/14 1200  Gross per 24 hour  Intake   1470 ml  Output      0 ml  Net   1470 ml   General: Elderly Caucasian female, resting in bed, NAD HEENT: PERRL, EOMI, no scleral icterus, oropharynx clear Cardiac: Systolic ejection murmur best appreciated the left upper sternal border Pulm: clear to auscultation bilaterally, no wheezes, rales, or rhonchi Abd: soft, nontender, nondistended, BS present Ext: warm and well perfused, no pedal edema Neuro: responds to questions appropriately; moving all  extremities freely   Lab Results: Basic Metabolic Panel:  Recent Labs Lab 10/21/14 0450 10/22/14 0535  NA 140 140  K 3.7 3.7  CL 107 106  CO2 27 26  GLUCOSE 73 63*  BUN 44* 38*  CREATININE 1.76* 1.96*  CALCIUM 9.5 9.0   Liver Function Tests:  Recent Labs Lab 10/17/14 0915 10/17/14 1000  AST 14 19  ALT 11 13  ALKPHOS 71 73  BILITOT 0.5 0.8  PROT 6.8 7.0  ALBUMIN 3.7 3.9   CBC:  Recent Labs Lab 10/17/14 1000  10/21/14 2120 10/22/14 0535  WBC 9.0  < > 5.3 5.6  NEUTROABS 7.5  --   --   --   HGB 6.7*  < > 7.5* 7.7*  HCT 19.8*  < > 22.1* 23.2*  MCV 90.0  < > 89.1 89.6  PLT 200  < > 171 181  < > = values in this interval not displayed. Cardiac Enzymes:  Recent Labs Lab 10/17/14 1000  TROPONINI 0.03   CBG:  Recent Labs Lab 10/21/14 0800 10/21/14 1145 10/21/14 1817 10/21/14 2122 10/22/14 0800 10/22/14 1216  GLUCAP 99 169* 149* 161* 66* 121*   Hemoglobin A1C:  Recent Labs Lab 10/17/14 1909  HGBA1C 9.6*   Thyroid Function Tests:  Recent Labs Lab 10/17/14 0915  TSH 19.740*  FREET4 0.83  T3FREE 2.3   Coagulation:  Recent Labs Lab 10/19/14 0253 10/20/14 1928 10/21/14  0450 10/22/14 0535  LABPROT 37.3* 23.4* 22.3* 21.7*  INR 3.74* 2.06* 1.94* 1.88*   Anemia Panel:  Recent Labs Lab 10/18/14 0850  VITAMINB12 511  FOLATE 8.4  FERRITIN 18  TIBC 279  IRON 86  RETICCTPCT 2.1   Urinalysis:  Recent Labs Lab 10/17/14 1050  COLORURINE YELLOW  LABSPEC 1.012  PHURINE 6.5  GLUCOSEU >1000*  HGBUR NEGATIVE  BILIRUBINUR NEGATIVE  KETONESUR NEGATIVE  PROTEINUR NEGATIVE  UROBILINOGEN 0.2  NITRITE NEGATIVE  LEUKOCYTESUR NEGATIVE   Micro Results: Recent Results (from the past 240 hour(s))  MRSA PCR Screening     Status: Abnormal   Collection Time: 10/17/14  6:09 PM  Result Value Ref Range Status   MRSA by PCR POSITIVE (A) NEGATIVE Final    Comment:        The GeneXpert MRSA Assay (FDA approved for NASAL specimens only), is  one component of a comprehensive MRSA colonization surveillance program. It is not intended to diagnose MRSA infection nor to guide or monitor treatment for MRSA infections. RESULT CALLED TO, READ BACK BY AND VERIFIED WITH: Carley Hammed RN [id number] 10/17/14 A BROWNING    Studies/Results: No results found. Medications: I have reviewed the patient's current medications. Scheduled Meds: . sodium chloride   Intravenous Once  . sodium chloride   Intravenous Once  . amiodarone  200 mg Oral Daily  . carvedilol  6.25 mg Oral BID WC  . darbepoetin (ARANESP) injection - NON-DIALYSIS  40 mcg Subcutaneous Q Fri-1800  . digoxin  0.0625 mg Oral QODAY  . docusate sodium  100 mg Oral Daily  . feeding supplement (ENSURE ENLIVE)  237 mL Oral BID BM  . furosemide  60 mg Oral Daily  . hydrALAZINE  12.5 mg Oral TID  . insulin aspart  0-15 Units Subcutaneous TID WC  . insulin aspart  0-5 Units Subcutaneous QHS  . insulin detemir  22 Units Subcutaneous QHS  . isosorbide mononitrate  30 mg Oral Daily  . levothyroxine  25 mcg Oral QAC breakfast  . living well with diabetes book   Does not apply Once  . pantoprazole (PROTONIX) IV  40 mg Intravenous Q24H  . polyethylene glycol  17 g Oral Daily  . pravastatin  80 mg Oral QHS  . warfarin  1.25 mg Oral ONCE-1800  . Warfarin - Pharmacist Dosing Inpatient   Does not apply q1800   Continuous Infusions:  PRN Meds:.acetaminophen **OR** acetaminophen, ondansetron **OR** ondansetron (ZOFRAN) IV, oxyCODONE-acetaminophen, simethicone Assessment/Plan: Principal Problem:   Anemia, normocytic normochromic Active Problems:   Cardiomyopathy, nonischemic   Diabetes mellitus, type 2   CHF (congestive heart failure)   Hypotension   Acute posthemorrhagic anemia   Nonspecific abnormal finding in stool contents  [name] is a 69 year old woman with history of symptomatic anemia, CKD stage III, type 2 diabetes, nonischemic cardiomyopathy status post ICD placement,  paroxysmal atrial fibrillation hospitalized for symptomatic normocytic anemia.  Symptomatic normocytic anemia: Upper endoscopy yesterday otherwise unremarkable for acute sources of bleeding. Extensive history collected today does not reveal any nutritional deficiencies or viral/occupational exposures which account for her current symptoms. Per documentation, it appears she has only received 3 units since admission which should put her at a hemoglobin 9-10. Aside from blood loss to account for her inadequate response to transfusion therapy, this may indicate inadequate bone marrow reserve. -Consider bone marrow biopsy -Ambulate patient to see if she can tolerate activity  Nonischemic cardiomyopathy: EF 50 and 20% with grade 2 diastolic dysfunction on echo 4/27.  Status post ICD placement on August 2014. Home medications include Coreg 12.5 mg twice daily, lisinopril 2.5 mg daily. -Holding lisinopril given elevated creatinine -Continue digoxin, Lasix, Imdur, Coreg -Cardiology following, appreciate recommendations   Acute on chronic kidney disease stage III: Creatinine 1.9, baseline 1.3-1.5. -Holding lisinopril as noted above  Type 2 diabetes: A1c is 9.6 on admission. CBGs trending low to mid 100s. -Continue sliding scale insulin -Continue Levemir 22 units at bedtime  Hypothyroidism: TSH 19.7 on admission. -Continue Synthroid 25 g daily  Paroxysmal atrial fibrillation: Her medications include amiodarone 200 mg daily, Coreg 12.5 mg twice daily, digoxin 0.5 mg daily, Coumadin. -Resume warfarin -Other medications noted above  #FEN:  -Diet: Heart healthy/carb modified   #DVT prophylaxis: Coumadin   #CODE STATUS: FULL CODE  Dispo: Disposition is deferred at this time, awaiting improvement of current medical problems.    The patient does have a current PCP Tina Evens, MD) and does not need an [facility] hospital follow-up appointment after discharge.  The patient does not have transportation  limitations that hinder transportation to clinic appointments.  .Services Needed at time of discharge: Y = Yes, Blank = No PT:   OT:   RN:   Equipment:   Other:     LOS: 5 days   Riccardo Dubin, MD 10/22/2014, 2:12 PM

## 2014-10-22 NOTE — Progress Notes (Signed)
No overt GI bleeding.  Hemoglobin is stable. No further GI  Studies.

## 2014-10-22 NOTE — Progress Notes (Signed)
ANTICOAGULATION CONSULT NOTE - Initial Consult  Pharmacy Consult for Coumadin  Indication: atrial fibrillation  No Known Allergies  Patient Measurements: Height: 5\' 5"  (165.1 cm) Weight: 149 lb 14.6 oz (68 kg) IBW/kg (Calculated) : 57  Vital Signs: Temp: 98.3 F (36.8 C) (05/01 0508) Temp Source: Oral (05/01 0508) BP: 105/59 mmHg (05/01 0508) Pulse Rate: 69 (05/01 0508)  Labs:  Recent Labs  10/20/14 0706 10/20/14 1928 10/21/14 0450 10/21/14 2120 10/22/14 0535  HGB 7.6*  --  7.2* 7.5* 7.7*  HCT 22.5*  --  21.2* 22.1* 23.2*  PLT 169  --  182 171 181  LABPROT  --  23.4* 22.3*  --  21.7*  INR  --  2.06* 1.94*  --  1.88*  CREATININE 1.87*  --  1.76*  --  1.96*    Estimated Creatinine Clearance: 24 mL/min (by C-G formula based on Cr of 1.96).   Medical History: Past Medical History  Diagnosis Date  . Cardiomyopathy, nonischemic     a. 1999 nl cath;  b. 12/05 [employer] [place];  c. 10/2005 ICD extraction 2/2 enterococcus bacteremia and Veg on RV lead;  c. 05/2008 low risk Myoview (scarring w/ some evidence of inf ischemia);  d. 11/2012 Echo: EF 15-20%;  e. 01/2013 s/p MDT [place] CRT D, ser # [id number];  f. 05/2013 Echo: EF 15%.  . Enterococcal infection     a. 10/2005 - AICD-explanted  . Pulmonary embolism     a. 07/2005 after total right hip arthroplasty  . Hilar density     a. infrahilar mass/adenopathy on CT scan 5/07; subsequently  resolved  . LBBB (left bundle branch block)   . GERD (gastroesophageal reflux disease)   . Hyperlipidemia   . Hypertension   . Tobacco abuse     a. discontinued in 1997, and then resumed  . Urinary incontinence   . Anemia     a. mild/chronic  . Villous adenoma of colon     a. tubovillous adenomatous polyp with focal high grade dysplasia; presented with hematochezia - followed by Dr. Laural Golden.  . Implantable cardioverter-defibrillator-CRT- Mdt     a.  01/2013 s/p MDT [place] CRT D, ser # [id number]  . Chronic systolic CHF (congestive heart  failure)     a. 11/2012 Echo: EF 15-20%;  b. 05/2013 TEE EF 15%.  . Pneumonia 07/2005  . Obstructive sleep apnea     a. mild-did not tolerate CPAP (01/24/2013)  . Type II diabetes mellitus   . History of blood transfusion   . Degenerative joint disease     of knees, shoulder, and hips  . PAF (paroxysmal atrial fibrillation)     a. 07/2012 s/p TEE/DCCV;  b. chronic coumadin;  c. 05/2013 Recurrent Afib->TEE/DCCV and amio initiation.  . CKD (chronic kidney disease), stage III     creatinin-1.44 in 1/09; 1.51 in 1/10    Assessment: 69 yo with nonischemic CMP/chronic systolic CHF, paroxysmal atrial fibrillation, CKD, and history of anemia presented on 10/17/2014 with profound anemia, weakness and hypotension. Warfarin had been held for GI work up which is now complete and negative for any acute bleeding. H/H now remains low but stable. Pharmacy has been consulted to restart Coumadin without heparin bridge. INR on admission was SUPRAtherapeutic at 3.4 and has now trended down to 1.88 (2.5 mg given on 4/29, otherwise has been held).   Home dose of warfarin is 2.5mg  daily (last dose on 4/25)  Goal of Therapy:  INR 2-3 Monitor platelets by  anticoagulation protocol: Yes   Plan:  - Restart coumadin at lower dose of 1.25 mg PO x 1 tonight since was admitted with SUPRAtherapeutic INR - Daily INR/CBC - Monitor for further s/s bleeding  [name] [name], PharmD, [place] Clinical Pharmacist - Resident Pager: [phone] Pharmacy: [phone] 10/22/2014 2:06 PM

## 2014-10-23 ENCOUNTER — Encounter (HOSPITAL_COMMUNITY): Payer: Self-pay | Admitting: Gastroenterology

## 2014-10-23 DIAGNOSIS — N183 Chronic kidney disease, stage 3 (moderate): Secondary | ICD-10-CM

## 2014-10-23 LAB — TYPE AND SCREEN
ABO/RH(D): A NEG
ANTIBODY SCREEN: NEGATIVE
UNIT DIVISION: 0
UNIT DIVISION: 0

## 2014-10-23 LAB — GLUCOSE, CAPILLARY
GLUCOSE-CAPILLARY: 75 mg/dL (ref 70–99)
Glucose-Capillary: 140 mg/dL — ABNORMAL HIGH (ref 70–99)
Glucose-Capillary: 149 mg/dL — ABNORMAL HIGH (ref 70–99)
Glucose-Capillary: 161 mg/dL — ABNORMAL HIGH (ref 70–99)

## 2014-10-23 LAB — CBC
HEMATOCRIT: 27.7 % — AB (ref 36.0–46.0)
Hemoglobin: 9.4 g/dL — ABNORMAL LOW (ref 12.0–15.0)
MCH: 30.2 pg (ref 26.0–34.0)
MCHC: 33.9 g/dL (ref 30.0–36.0)
MCV: 89.1 fL (ref 78.0–100.0)
PLATELETS: 198 10*3/uL (ref 150–400)
RBC: 3.11 MIL/uL — AB (ref 3.87–5.11)
RDW: 15 % (ref 11.5–15.5)
WBC: 6.2 10*3/uL (ref 4.0–10.5)

## 2014-10-23 LAB — BASIC METABOLIC PANEL
Anion gap: 7 (ref 5–15)
BUN: 33 mg/dL — ABNORMAL HIGH (ref 6–20)
CALCIUM: 9.3 mg/dL (ref 8.9–10.3)
CHLORIDE: 106 mmol/L (ref 101–111)
CO2: 26 mmol/L (ref 22–32)
CREATININE: 1.86 mg/dL — AB (ref 0.44–1.00)
GFR calc non Af Amer: 26 mL/min — ABNORMAL LOW (ref >60)
GFR, EST AFRICAN AMERICAN: 31 mL/min — AB (ref >60)
Glucose, Bld: 74 mg/dL (ref 70–99)
POTASSIUM: 3.7 mmol/L (ref 3.5–5.1)
SODIUM: 139 mmol/L (ref 135–145)

## 2014-10-23 LAB — UIFE/LIGHT CHAINS/TP QN, 24-HR UR
ALPHA 1 UR: 3.9 %
Albumin, U: 26.3 %
Alpha 2, Urine: 24.9 %
BETA UR: 27.6 %
Free Kappa/Lambda Ratio: 6.91 (ref 2.04–10.37)
Free Lambda Lt Chains,Ur: 24.6 mg/L — ABNORMAL HIGH (ref 0.24–6.66)
Free Lt Chn Excr Rate: 170 mg/L — ABNORMAL HIGH (ref 1.35–24.19)
Gamma Globulin, Urine: 17.3 %
TOTAL PROTEIN, URINE-UR/DAY: 142.5 mg/(24.h) (ref 30.0–150.0)
Time: 24 hours
Total Protein, Urine: 9.5 mg/dL (ref 0.0–15.0)
Volume, Urine: 1500 mL

## 2014-10-23 LAB — PROTIME-INR
INR: 1.51 — ABNORMAL HIGH (ref 0.00–1.49)
Prothrombin Time: 18.4 s — ABNORMAL HIGH (ref 11.6–15.2)

## 2014-10-23 LAB — COLD AGGLUTININ TITER

## 2014-10-23 MED ORDER — PEG 3350-KCL-NA BICARB-NACL 420 G PO SOLR
4000.0000 mL | Freq: Once | ORAL | Status: AC
  Administered 2014-10-23: 4000 mL via ORAL
  Filled 2014-10-23: qty 4000

## 2014-10-23 NOTE — Progress Notes (Signed)
Patient ID: [name], female   DOB: Jun 04, 1946, 68 y.o.   MRN: [id number] Hematology: No source of bleeding on upper endoscopy. Serum immunoglobulin levels normal and no monoclonal proteins on serum or urine immunofixation electrophoresis. Total 24 hour urine protein not in pathologic range at 300 mg. Urine kappa and lambda light chains are in normal ratios and not monoclonal Creatinine clearance is 28 ml/min. Hemoglobins have stabilized above 9 grams. Impression: Main reason for this normochromic anemia is renal dysfunction. Recommend observation alone at this time.  Value of ESA at this hemoglobin level borderline since Medicare will make you hold doses if Hb 10 or above and current values are close to this. I will get a baseline erythropoietin level - also required by medicare - as I anticipate we would initiate a trial of ESA in future if Hb falls again. I would defer doing a bone marrow biopsy at this point in time since if we diagnose a low grade myelodysplastic syndrome, management will not change - observation - ESA for progression.

## 2014-10-23 NOTE — Progress Notes (Signed)
  Date: 10/23/2014  Patient name: [name]  Medical record number: [id number]  Date of birth: 1946-02-23   This patient's plan of care was discussed with the house staff. Please see Dr. Serita Grit note for complete details. I concur with his findings.  As noted, patient reported large black BM.  She had PRBC yesterday with good response of Hgb.  Also orthostatic yesterday.  GI is following the patient, EGD on 10/21/14 with an early stricture at the GEJ, otherwise normal EGD; she will likely need a colonoscopy.   Sid Falcon, MD 10/23/2014, 1:31 PM

## 2014-10-23 NOTE — Progress Notes (Signed)
PT Cancellation Note  Patient Details Name: ABIOLA BEHRING MRN: [id number] DOB: 21-Apr-1946   Cancelled Treatment:    Reason Eval/Treat Not Completed: Medical issues which prohibited therapy; patient complained of fatigue earlier and requested PT return later.  Now pt drinking prep for colonoscopy tomorrow.  Will attempt tomorrow following procedure if able.   Maddelynn Moosman,CYNDI 10/23/2014, 4:33 PM

## 2014-10-23 NOTE — Progress Notes (Signed)
Patient ID: [name], female   DOB: [birth year], 69 y.o.   MRN: [id number]   SUBJECTIVE: Patient was admitted with profound anemia (hgb 6.4), weakness, and hypotension.  She has received a total of 5 units PRBCs.  Diuretics held with initial AKI and hypotension, back on Lasix now.  She had transfusion yesterday, hemoglobin stable post-transfusion.  She reports large black BM yesterday.    Creatinine 2.46>2.1>1.87>1.86   ECHO EF 15-20% Grade II D, mild MR, mild to moderate AI, RV normal.   Scheduled Meds: . amiodarone  200 mg Oral Daily  . carvedilol  6.25 mg Oral BID WC  . darbepoetin (ARANESP) injection - NON-DIALYSIS  40 mcg Subcutaneous Q Fri-1800  . digoxin  0.0625 mg Oral QODAY  . docusate sodium  100 mg Oral Daily  . feeding supplement (ENSURE ENLIVE)  237 mL Oral BID BM  . furosemide  60 mg Oral Daily  . hydrALAZINE  12.5 mg Oral TID  . insulin aspart  0-15 Units Subcutaneous TID WC  . insulin aspart  0-5 Units Subcutaneous QHS  . insulin detemir  22 Units Subcutaneous QHS  . isosorbide mononitrate  30 mg Oral Daily  . levothyroxine  25 mcg Oral QAC breakfast  . living well with diabetes book   Does not apply Once  . pantoprazole (PROTONIX) IV  40 mg Intravenous Q24H  . polyethylene glycol  17 g Oral Daily  . pravastatin  80 mg Oral QHS   Continuous Infusions:   PRN Meds:.acetaminophen **OR** acetaminophen, ondansetron **OR** ondansetron (ZOFRAN) IV, oxyCODONE-acetaminophen, simethicone   Filed Vitals:   10/22/14 1840 10/22/14 1923 10/23/14 0000 10/23/14 0536  BP: 131/61 120/57 124/65 118/67  Pulse: 75 70 70 69  Temp: 98.8 F (37.1 C) 98.6 F (37 C) 98 F (36.7 C) 97.9 F (36.6 C)  TempSrc: Oral Oral Oral Oral  Resp: 18 18 16 18   Height:      Weight:    149 lb 1.6 oz (67.631 kg)  SpO2: 98% 94% 95% 98%    Intake/Output Summary (Last 24 hours) at 10/23/14 0801 Last data filed at 10/22/14 1910  Gross per 24 hour  Intake   1290 ml  Output      0 ml  Net    1290 ml    LABS: Basic Metabolic Panel:  Recent Labs  10/22/14 0535 10/23/14 0344  NA 140 139  K 3.7 3.7  CL 106 106  CO2 26 26  GLUCOSE 63* 74  BUN 38* 33*  CREATININE 1.96* 1.86*  CALCIUM 9.0 9.3   Liver Function Tests: No results for input(s): AST, ALT, ALKPHOS, BILITOT, PROT, ALBUMIN in the last 72 hours. No results for input(s): LIPASE, AMYLASE in the last 72 hours. CBC:  Recent Labs  10/22/14 2150 10/23/14 0344  WBC 6.7 6.2  HGB 9.7* 9.4*  HCT 28.8* 27.7*  MCV 89.2 89.1  PLT 213 198   Cardiac Enzymes: No results for input(s): CKTOTAL, CKMB, CKMBINDEX, TROPONINI in the last 72 hours. BNP: Invalid input(s): POCBNP D-Dimer: No results for input(s): DDIMER in the last 72 hours. Hemoglobin A1C: No results for input(s): HGBA1C in the last 72 hours. Fasting Lipid Panel: No results for input(s): CHOL, HDL, LDLCALC, TRIG, CHOLHDL, LDLDIRECT in the last 72 hours. Thyroid Function Tests: No results for input(s): TSH, T4TOTAL, T3FREE, THYROIDAB in the last 72 hours.  Invalid input(s): FREET3 Anemia Panel: No results for input(s): VITAMINB12, FOLATE, FERRITIN, TIBC, IRON, RETICCTPCT in the last 72 hours.  RADIOLOGY:  Dg Chest 2 View  10/17/2014   CLINICAL DATA:  Dizziness, nausea 2 days  EXAM: CHEST  2 VIEW  COMPARISON:  01/10/2014  FINDINGS: There is no focal parenchymal opacity, pleural effusion, or pneumothorax. The heart and mediastinal contours are stable. There is a 3 lead AICD.  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  IMPRESSION: No active cardiopulmonary disease.   Electronically Signed   By: [name]   On: 10/17/2014 11:41    PHYSICAL EXAM General: NAD Neck: JVP 7 cm, no thyromegaly or thyroid nodule.  Lungs: Clear to auscultation bilaterally with normal respiratory effort. CV: Nondisplaced PMI.  Heart regular S1/S2, no S3/S4, 2/6 HSM LLSB/apex.  No peripheral edema.  No carotid bruit.  Normal pedal pulses.  Abdomen: Soft, nontender, no  hepatosplenomegaly, no distention.  Neurologic: Alert and oriented x 3.  Psych: Normal affect. Extremities: No clubbing or cyanosis.   TELEMETRY: Reviewed telemetry pt in A-BiV paced  ASSESSMENT AND PLAN: 69 yo with nonischemic CMP/chronic systolic CHF, paroxysmal atrial fibrillation, CKD, and history of anemia presented with profound anemia, weakness and hypotension.  1. Chronic systolic CHF: Patient has a nonischemic cardiomyopathy. Echo EF 15-20% Grade II DD. NYHA class III symptoms. RHC (10/15) showed low filling pressures and normal cardiac output.  She is unable to do CPX testing and would not be LVAD candidate.  Creatinine back down to actually below her baseline now.  She does not look volume overloaded. BP stable. - Continue current Coreg, hydralazine/Imdur - I am going to keep her off lisinopril with significant CKD.  No spironolactone.  - Digoxin every other day with level 1.1. - Continue Lasix 60 mg daily.  2. Atrial fibrillation: Paroxysmal, not in afib this admission.  - She will continue amiodarone for now. TSH high, Levoxyl begun. - She has been on coumadin, resume after GI workup completed. 3. AKI on CKD: Improved. Will keep off lisinopril.    4. Anemia: She had EGD and c-scope in 12/14. In 12/15, she had transfusion but apparently no-showed for her GI workup. Seen by hematology this admission, suspect normocytic anemia is most likely due to combination of CKD and hypothyroidism, less likely MDS.  Would likely be helpful to get her started on erythropoietin.  She has had 5 units PRBCs. I am concerned about possible GI bleeding with melenotic stool yesterday.  Had EGD this admission, no bleeding source.  ?need for colonoscopy, would have GI reassess as she is going to need ongoing coumadin for atrial fibrillation.   Loralie Champagne  10/23/2014 8:01 AM

## 2014-10-23 NOTE — Progress Notes (Addendum)
Subjective: Yesterday afternoon, she had a black tarry bowel movement with bright red blood was transfused PRBC 1. Orthostatic vital signs yesterday were suggestive of orthostatic hypotension. This morning, her husband is at bedside, and she denies any complaints.   Objective: Vital signs in last 24 hours: Filed Vitals:   10/22/14 1840 10/22/14 1923 10/23/14 0000 10/23/14 0536  BP: 131/61 120/57 124/65 118/67  Pulse: 75 70 70 69  Temp: 98.8 F (37.1 C) 98.6 F (37 C) 98 F (36.7 C) 97.9 F (36.6 C)  TempSrc: Oral Oral Oral Oral  Resp: 18 18 16 18   Height:      Weight:    149 lb 1.6 oz (67.631 kg)  SpO2: 98% 94% 95% 98%   Weight change: -13 oz (-0.369 kg)  Intake/Output Summary (Last 24 hours) at 10/23/14 1128 Last data filed at 10/23/14 0900  Gross per 24 hour  Intake   1170 ml  Output      0 ml  Net   1170 ml   Exam largely stable from yesterday [10/22/14]  General: Elderly Caucasian female, resting in bed, NAD HEENT: PERRL, EOMI, no scleral icterus, oropharynx clear Cardiac: Systolic ejection murmur best appreciated the left upper sternal border Pulm: clear to auscultation bilaterally, no wheezes, rales, or rhonchi Abd: soft, nontender, nondistended, BS present Ext: warm and well perfused, no pedal edema Neuro: responds to questions appropriately; moving all extremities freely   Lab Results: Basic Metabolic Panel:  Recent Labs Lab 10/22/14 0535 10/23/14 0344  NA 140 139  K 3.7 3.7  CL 106 106  CO2 26 26  GLUCOSE 63* 74  BUN 38* 33*  CREATININE 1.96* 1.86*  CALCIUM 9.0 9.3   Liver Function Tests:  Recent Labs Lab 10/17/14 0915 10/17/14 1000  AST 14 19  ALT 11 13  ALKPHOS 71 73  BILITOT 0.5 0.8  PROT 6.8 7.0  ALBUMIN 3.7 3.9   CBC:  Recent Labs Lab 10/17/14 1000  10/22/14 2150 10/23/14 0344  WBC 9.0  < > 6.7 6.2  NEUTROABS 7.5  --   --   --   HGB 6.7*  < > 9.7* 9.4*  HCT 19.8*  < > 28.8* 27.7*  MCV 90.0  < > 89.2 89.1  PLT 200  < >  213 198  < > = values in this interval not displayed. Cardiac Enzymes:  Recent Labs Lab 10/17/14 1000  TROPONINI 0.03   CBG:  Recent Labs Lab 10/21/14 2122 10/22/14 0800 10/22/14 1216 10/22/14 1701 10/22/14 2105 10/23/14 0752  GLUCAP 161* 66* 121* 211* 192* 75   Hemoglobin A1C:  Recent Labs Lab 10/17/14 1909  HGBA1C 9.6*   Thyroid Function Tests:  Recent Labs Lab 10/17/14 0915  TSH 19.740*  FREET4 0.83  T3FREE 2.3   Coagulation:  Recent Labs Lab 10/21/14 0450 10/22/14 0535 10/22/14 1800 10/23/14 0344  LABPROT 22.3* 21.7* 19.4* 18.4*  INR 1.94* 1.88* 1.62* 1.51*   Anemia Panel:  Recent Labs Lab 10/18/14 0850  VITAMINB12 511  FOLATE 8.4  FERRITIN 18  TIBC 279  IRON 86  RETICCTPCT 2.1   Urinalysis:  Recent Labs Lab 10/17/14 1050  COLORURINE YELLOW  LABSPEC 1.012  PHURINE 6.5  GLUCOSEU >1000*  HGBUR NEGATIVE  BILIRUBINUR NEGATIVE  KETONESUR NEGATIVE  PROTEINUR NEGATIVE  UROBILINOGEN 0.2  NITRITE NEGATIVE  LEUKOCYTESUR NEGATIVE   Micro Results: Recent Results (from the past 240 hour(s))  MRSA PCR Screening     Status: Abnormal   Collection Time: 10/17/14  6:09 PM  Result Value Ref Range Status   MRSA by PCR POSITIVE (A) NEGATIVE Final    Comment:        The GeneXpert MRSA Assay (FDA approved for NASAL specimens only), is one component of a comprehensive MRSA colonization surveillance program. It is not intended to diagnose MRSA infection nor to guide or monitor treatment for MRSA infections. RESULT CALLED TO, READ BACK BY AND VERIFIED WITH: Carley Hammed RN 0932 10/17/14 A BROWNING    Studies/Results: No results found. Medications: I have reviewed the patient's current medications. Scheduled Meds: . amiodarone  200 mg Oral Daily  . carvedilol  6.25 mg Oral BID WC  . darbepoetin (ARANESP) injection - NON-DIALYSIS  40 mcg Subcutaneous Q Fri-1800  . digoxin  0.0625 mg Oral QODAY  . docusate sodium  100 mg Oral Daily  .  feeding supplement (ENSURE ENLIVE)  237 mL Oral BID BM  . furosemide  60 mg Oral Daily  . hydrALAZINE  12.5 mg Oral TID  . insulin aspart  0-15 Units Subcutaneous TID WC  . insulin aspart  0-5 Units Subcutaneous QHS  . insulin detemir  22 Units Subcutaneous QHS  . isosorbide mononitrate  30 mg Oral Daily  . levothyroxine  25 mcg Oral QAC breakfast  . pantoprazole (PROTONIX) IV  40 mg Intravenous Q24H  . polyethylene glycol  17 g Oral Daily  . pravastatin  80 mg Oral QHS   Continuous Infusions:  PRN Meds:.acetaminophen **OR** acetaminophen, ondansetron **OR** ondansetron (ZOFRAN) IV, oxyCODONE-acetaminophen, simethicone Assessment/Plan: Principal Problem:   Anemia, normocytic normochromic Active Problems:   Cardiomyopathy, nonischemic   Diabetes mellitus, type 2   CHF (congestive heart failure)   Hypotension   Acute posthemorrhagic anemia   Nonspecific abnormal finding in stool contents  Ms. Blackard is a 69 year old woman with history of symptomatic anemia, CKD stage III, type 2 diabetes, nonischemic cardiomyopathy status post ICD placement, paroxysmal atrial fibrillation hospitalized for symptomatic normocytic anemia.  Symptomatic normocytic anemia: though there are signs of active bleeding, she has demonstrated inadequate response to transfusion therapy. This morning, posttransfusion CBC notable for hemoglobin 9.4 which is reassuring for now.   -Reconsult GI for possible intervention  -Continue ambulating patient as tolerated  Nonischemic cardiomyopathy: EF 15-20% with grade 2 diastolic dysfunction on echo 4/27. Status post ICD placement on August 2014. Home medications include Coreg 12.5 mg twice daily, lisinopril 2.5 mg daily. -Holding lisinopril given elevated creatinine -Continue digoxin, Lasix, Imdur, Coreg -Cardiology following, appreciate recommendations   Chronic kidney disease stage III: Creatinine 1.89 which appears to be her baseline since July 2015 though was lower in  the years prior with values ranging 1.3-1.5. -Holding lisinopril as noted above  Type 2 diabetes: A1c is 9.6 on admission. CBGs variable with several values below 100 along with high 100s. -Continue sliding scale insulin -Continue Levemir 22 units at bedtime  Hypothyroidism: TSH 19.7 on admission. -Continue Synthroid 25 g daily  Paroxysmal atrial fibrillation: Her medications include amiodarone 200 mg daily, Coreg 12.5 mg twice daily, digoxin 0.5 mg daily, Coumadin. -Holding warfarin for possible procedure -Other medications noted above  #FEN:  -Diet: Heart healthy/carb modified   #DVT prophylaxis: Coumadin   #CODE STATUS: FULL CODE  Dispo: Disposition is deferred at this time, awaiting improvement of current medical problems.    The patient does have a current PCP Lemmie Evens, MD) and does not need an [facility] hospital follow-up appointment after discharge.  The patient does not have transportation limitations that hinder transportation  to clinic appointments.  .Services Needed at time of discharge: Y = Yes, Blank = No PT:   OT:   RN:   Equipment:   Other:     LOS: 6 days   Riccardo Dubin, MD 10/23/2014, 11:28 AM

## 2014-10-23 NOTE — Progress Notes (Signed)
Inpatient Diabetes Program Recommendations  AACE/ADA: New Consensus Statement on Inpatient Glycemic Control (2013)  Target Ranges:  Prepandial:   less than 140 mg/dL      Peak postprandial:   less than 180 mg/dL (1-2 hours)      Critically ill patients:  140 - 180 mg/dL   Diabetes history: DM 2 Outpatient Diabetes medications: None  Inpatient Diabetes Program Recommendations  Oral Agents: Patient has an 9.6% A1c on 10/17/14. Patient requiring Levemir 22 units, and correction scale while here. Patient did not have any diabetic medications on her home med rec when she was admitted. When discharged patient, Consider placing patient on either insulin or PO/insulin dual therapy.  Note: Patient did receive diet and exercise education last week.    Thanks,  Tama Headings RN, MSN, [facility] Inpatient Diabetes Coordinator Team Pager [phone]

## 2014-10-23 NOTE — Progress Notes (Signed)
UNASSIGNED PATIENT Subjective: Since I last evaluated the patient, she has had melenic stools yesterday with some BRBPR and a melenic BM again today. She denies having any abdominal pain, nausea or vomiting.  Objective: Vital signs in last 24 hours: Temp:  [97.9 F (36.6 C)-99 F (37.2 C)] 98 F (36.7 C) (05/02 1325) Pulse Rate:  [69-75] 74 (05/02 1325) Resp:  [16-18] 16 (05/02 1325) BP: (109-132)/(57-76) 109/76 mmHg (05/02 1325) SpO2:  [94 %-99 %] 99 % (05/02 1325) Weight:  [67.631 kg (149 lb 1.6 oz)] 67.631 kg (149 lb 1.6 oz) (05/02 0536) Last BM Date: 10/22/14  Intake/Output from previous day: 05/01 0701 - 05/02 0700 In: 1290 [P.O.:1200; Blood:90] Out: -  Intake/Output this shift: Total I/O In: 480 [P.O.:480] Out: -   General appearance: alert, cooperative, appears stated age, fatigued and no distress Resp: clear to auscultation bilaterally Cardio: regular rate and rhythm, S1, S2 normal, no murmur, click, rub or gallop GI: soft, non-tender; bowel sounds normal; no masses,  no organomegaly  Lab Results:  Recent Labs  10/22/14 1800 10/22/14 2150 10/23/14 0344  WBC 6.0 6.7 6.2  HGB 7.9* 9.7* 9.4*  HCT 23.8* 28.8* 27.7*  PLT 196 213 198   BMET  Recent Labs  10/21/14 0450 10/22/14 0535 10/23/14 0344  NA 140 140 139  K 3.7 3.7 3.7  CL 107 106 106  CO2 27 26 26   GLUCOSE 73 63* 74  BUN 44* 38* 33*  CREATININE 1.76* 1.96* 1.86*  CALCIUM 9.5 9.0 9.3   PT/INR  Recent Labs  10/22/14 1800 10/23/14 0344  LABPROT 19.4* 18.4*  INR 1.62* 1.51*   Medications: I have reviewed the patient's current medications.  Assessment/Plan: 1) Iron deficiency anemia s/p transfusion of 1 unit of PRBC's. Will proceed with a colonoscopy tomorrow. Will prep tonight. INR is WNL to proceed. 2) GERD on PPI's. 3) Constipation on Miralax.   LOS: 6 days   [name] 10/23/2014, 4:47 PM

## 2014-10-24 ENCOUNTER — Encounter (HOSPITAL_COMMUNITY)
Admission: EM | Disposition: A | Discharge status: Home Health | Payer: Self-pay | Source: Home / Self Care | Attending: Internal Medicine

## 2014-10-24 HISTORY — PX: COLONOSCOPY: SHX5424

## 2014-10-24 HISTORY — PX: GIVENS CAPSULE STUDY: SHX5432

## 2014-10-24 LAB — GLUCOSE, CAPILLARY
GLUCOSE-CAPILLARY: 72 mg/dL (ref 70–99)
GLUCOSE-CAPILLARY: 78 mg/dL (ref 70–99)
GLUCOSE-CAPILLARY: 87 mg/dL (ref 70–99)
Glucose-Capillary: 77 mg/dL (ref 70–99)
Glucose-Capillary: 208 mg/dL — ABNORMAL HIGH (ref 70–99)

## 2014-10-24 LAB — BASIC METABOLIC PANEL
Anion gap: 10 (ref 5–15)
BUN: 24 mg/dL — AB (ref 6–20)
CO2: 24 mmol/L (ref 22–32)
CREATININE: 1.61 mg/dL — AB (ref 0.44–1.00)
Calcium: 9.2 mg/dL (ref 8.9–10.3)
Chloride: 106 mmol/L (ref 101–111)
GFR calc Af Amer: 36 mL/min — ABNORMAL LOW (ref >60)
GFR calc non Af Amer: 31 mL/min — ABNORMAL LOW (ref >60)
Glucose, Bld: 68 mg/dL — ABNORMAL LOW (ref 70–99)
Potassium: 3.3 mmol/L — ABNORMAL LOW (ref 3.5–5.1)
Sodium: 140 mmol/L (ref 135–145)

## 2014-10-24 LAB — CBC
HCT: 28.1 % — ABNORMAL LOW (ref 36.0–46.0)
HEMOGLOBIN: 9.3 g/dL — AB (ref 12.0–15.0)
MCH: 30.4 pg (ref 26.0–34.0)
MCHC: 33.1 g/dL (ref 30.0–36.0)
MCV: 91.8 fL (ref 78.0–100.0)
Platelets: 204 10*3/uL (ref 150–400)
RBC: 3.06 MIL/uL — ABNORMAL LOW (ref 3.87–5.11)
RDW: 15.3 % (ref 11.5–15.5)
WBC: 5.3 10*3/uL (ref 4.0–10.5)

## 2014-10-24 LAB — PROTIME-INR
INR: 1.34 (ref 0.00–1.49)
Prothrombin Time: 16.7 seconds — ABNORMAL HIGH (ref 11.6–15.2)

## 2014-10-24 SURGERY — IMAGING PROCEDURE, GI TRACT, INTRALUMINAL, VIA CAPSULE
Anesthesia: LOCAL

## 2014-10-24 SURGERY — COLONOSCOPY
Anesthesia: Moderate Sedation | Laterality: Left

## 2014-10-24 MED ORDER — POTASSIUM CHLORIDE CRYS ER 20 MEQ PO TBCR
40.0000 meq | EXTENDED_RELEASE_TABLET | Freq: Once | ORAL | Status: AC
  Administered 2014-10-24: 40 meq via ORAL
  Filled 2014-10-24 (×2): qty 2

## 2014-10-24 MED ORDER — MIDAZOLAM HCL 5 MG/ML IJ SOLN
INTRAMUSCULAR | Status: AC
  Filled 2014-10-24: qty 2

## 2014-10-24 MED ORDER — POTASSIUM CHLORIDE CRYS ER 20 MEQ PO TBCR: 40.0000 meq | EXTENDED_RELEASE_TABLET | Freq: Once | ORAL | Status: DC

## 2014-10-24 MED ORDER — SODIUM CHLORIDE 0.9 % IV SOLN: INTRAVENOUS | Status: DC

## 2014-10-24 MED ORDER — SODIUM CHLORIDE 0.9 % IV SOLN
INTRAVENOUS | Status: DC
  Administered 2014-10-24: 500 mL via INTRAVENOUS

## 2014-10-24 MED ORDER — FENTANYL CITRATE (PF) 100 MCG/2ML IJ SOLN
INTRAMUSCULAR | Status: AC
  Filled 2014-10-24: qty 2

## 2014-10-24 MED ORDER — FENTANYL CITRATE (PF) 100 MCG/2ML IJ SOLN
INTRAMUSCULAR | Status: DC | PRN
  Administered 2014-10-24: 25 ug via INTRAVENOUS
  Administered 2014-10-24: 12.5 ug via INTRAVENOUS
  Administered 2014-10-24: 25 ug via INTRAVENOUS

## 2014-10-24 MED ORDER — MIDAZOLAM HCL 5 MG/5ML IJ SOLN
INTRAMUSCULAR | Status: DC | PRN
  Administered 2014-10-24 (×2): 2 mg via INTRAVENOUS
  Administered 2014-10-24 (×2): 1 mg via INTRAVENOUS

## 2014-10-24 SURGICAL SUPPLY — 1 items: TOWEL COTTON PACK 4EA (MISCELLANEOUS) ×4 IMPLANT

## 2014-10-24 NOTE — H&P (View-Only) (Signed)
UNASSIGNED PATIENT Subjective: Since I last evaluated the patient, she has had melenic stools yesterday with some BRBPR and a melenic BM again today. She denies having any abdominal pain, nausea or vomiting.  Objective: Vital signs in last 24 hours: Temp:  [97.9 F (36.6 C)-99 F (37.2 C)] 98 F (36.7 C) (05/02 1325) Pulse Rate:  [69-75] 74 (05/02 1325) Resp:  [16-18] 16 (05/02 1325) BP: (109-132)/(57-76) 109/76 mmHg (05/02 1325) SpO2:  [94 %-99 %] 99 % (05/02 1325) Weight:  [67.631 kg (149 lb 1.6 oz)] 67.631 kg (149 lb 1.6 oz) (05/02 0536) Last BM Date: 10/22/14  Intake/Output from previous day: 05/01 0701 - 05/02 0700 In: 1290 [P.O.:1200; Blood:90] Out: -  Intake/Output this shift: Total I/O In: 480 [P.O.:480] Out: -   General appearance: alert, cooperative, appears stated age, fatigued and no distress Resp: clear to auscultation bilaterally Cardio: regular rate and rhythm, S1, S2 normal, no murmur, click, rub or gallop GI: soft, non-tender; bowel sounds normal; no masses,  no organomegaly  Lab Results:  Recent Labs  10/22/14 1800 10/22/14 2150 10/23/14 0344  WBC 6.0 6.7 6.2  HGB 7.9* 9.7* 9.4*  HCT 23.8* 28.8* 27.7*  PLT 196 213 198   BMET  Recent Labs  10/21/14 0450 10/22/14 0535 10/23/14 0344  NA 140 140 139  K 3.7 3.7 3.7  CL 107 106 106  CO2 27 26 26   GLUCOSE 73 63* 74  BUN 44* 38* 33*  CREATININE 1.76* 1.96* 1.86*  CALCIUM 9.5 9.0 9.3   PT/INR  Recent Labs  10/22/14 1800 10/23/14 0344  LABPROT 19.4* 18.4*  INR 1.62* 1.51*   Medications: I have reviewed the patient's current medications.  Assessment/Plan: 1) Iron deficiency anemia s/p transfusion of 1 unit of PRBC's. Will proceed with a colonoscopy tomorrow. Will prep tonight. INR is WNL to proceed. 2) GERD on PPI's. 3) Constipation on Miralax.   LOS: 6 days   Gerrod Maule 10/23/2014, 4:47 PM

## 2014-10-24 NOTE — Progress Notes (Signed)
  Date: 10/24/2014  Patient name: [name]  Medical record number: [id number]  Date of birth: Sep 05, 1945   This patient's plan of care was discussed with the house staff. Please see Dr. Serita Grit note for complete details. I concur with his findings.  [name] underwent colonoscopy today, findings noted in GI notes.  She is likely approaching discharge, depending on if a capsule endoscopy is considered.     Sid Falcon, MD 10/24/2014, 3:09 PM

## 2014-10-24 NOTE — Progress Notes (Signed)
Patient ID: [name], female   DOB: [birth year], 69 y.o.   MRN: [id number]   SUBJECTIVE: Patient was admitted with profound anemia (hgb 6.4), weakness, and hypotension.  She has received a total of 5 units PRBCs.  Diuretics held with initial AKI and hypotension, back on Lasix now.    No further melena.  No lightheadedness.  Creatinine 2.46>2.1>1.87>1.86>1.6  ECHO EF 15-20% Grade II D, mild MR, mild to moderate AI, RV normal.   Scheduled Meds: . amiodarone  200 mg Oral Daily  . carvedilol  6.25 mg Oral BID WC  . darbepoetin (ARANESP) injection - NON-DIALYSIS  40 mcg Subcutaneous Q Fri-1800  . digoxin  0.0625 mg Oral QODAY  . docusate sodium  100 mg Oral Daily  . feeding supplement (ENSURE ENLIVE)  237 mL Oral BID BM  . furosemide  60 mg Oral Daily  . hydrALAZINE  12.5 mg Oral TID  . insulin aspart  0-15 Units Subcutaneous TID WC  . insulin aspart  0-5 Units Subcutaneous QHS  . insulin detemir  22 Units Subcutaneous QHS  . isosorbide mononitrate  30 mg Oral Daily  . levothyroxine  25 mcg Oral QAC breakfast  . pantoprazole (PROTONIX) IV  40 mg Intravenous Q24H  . polyethylene glycol  17 g Oral Daily  . pravastatin  80 mg Oral QHS   Continuous Infusions:   PRN Meds:.acetaminophen **OR** acetaminophen, ondansetron **OR** ondansetron (ZOFRAN) IV, oxyCODONE-acetaminophen, simethicone   Filed Vitals:   10/23/14 0536 10/23/14 1325 10/23/14 2013 10/24/14 0337  BP: 118/67 109/76 133/70 151/80  Pulse: 69 74 74 71  Temp: 97.9 F (36.6 C) 98 F (36.7 C) 98.4 F (36.9 C) 98.2 F (36.8 C)  TempSrc: Oral Oral Oral Oral  Resp: 18 16 16 16   Height:      Weight: 149 lb 1.6 oz (67.631 kg)   148 lb 14.4 oz (67.541 kg)  SpO2: 98% 99% 99% 100%    Intake/Output Summary (Last 24 hours) at 10/24/14 0902 Last data filed at 10/23/14 2100  Gross per 24 hour  Intake    350 ml  Output      0 ml  Net    350 ml    LABS: Basic Metabolic Panel:  Recent Labs  10/23/14 0344 10/24/14 0420    NA 139 140  K 3.7 3.3*  CL 106 106  CO2 26 24  GLUCOSE 74 68*  BUN 33* 24*  CREATININE 1.86* 1.61*  CALCIUM 9.3 9.2   Liver Function Tests: No results for input(s): AST, ALT, ALKPHOS, BILITOT, PROT, ALBUMIN in the last 72 hours. No results for input(s): LIPASE, AMYLASE in the last 72 hours. CBC:  Recent Labs  10/23/14 0344 10/24/14 0420  WBC 6.2 5.3  HGB 9.4* 9.3*  HCT 27.7* 28.1*  MCV 89.1 91.8  PLT 198 204   Cardiac Enzymes: No results for input(s): CKTOTAL, CKMB, CKMBINDEX, TROPONINI in the last 72 hours. BNP: Invalid input(s): POCBNP D-Dimer: No results for input(s): DDIMER in the last 72 hours. Hemoglobin A1C: No results for input(s): HGBA1C in the last 72 hours. Fasting Lipid Panel: No results for input(s): CHOL, HDL, LDLCALC, TRIG, CHOLHDL, LDLDIRECT in the last 72 hours. Thyroid Function Tests: No results for input(s): TSH, T4TOTAL, T3FREE, THYROIDAB in the last 72 hours.  Invalid input(s): FREET3 Anemia Panel: No results for input(s): VITAMINB12, FOLATE, FERRITIN, TIBC, IRON, RETICCTPCT in the last 72 hours.  RADIOLOGY: Dg Chest 2 View  10/17/2014   CLINICAL DATA:  Dizziness, nausea 2  days  EXAM: CHEST  2 VIEW  COMPARISON:  01/10/2014  FINDINGS: There is no focal parenchymal opacity, pleural effusion, or pneumothorax. The heart and mediastinal contours are stable. There is a 3 lead AICD.  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  IMPRESSION: No active cardiopulmonary disease.   Electronically Signed   By: [name]   On: 10/17/2014 11:41    PHYSICAL EXAM General: NAD Neck: JVP 7 cm, no thyromegaly or thyroid nodule.  Lungs: Clear to auscultation bilaterally with normal respiratory effort. CV: Nondisplaced PMI.  Heart regular S1/S2, no S3/S4, 2/6 HSM LLSB/apex.  No peripheral edema.  No carotid bruit.  Normal pedal pulses.  Abdomen: Soft, nontender, no hepatosplenomegaly, no distention.  Neurologic: Alert and oriented x 3.  Psych: Normal  affect. Extremities: No clubbing or cyanosis.   TELEMETRY: Reviewed telemetry pt in A-BiV paced  ASSESSMENT AND PLAN: 69 yo with nonischemic CMP/chronic systolic CHF, paroxysmal atrial fibrillation, CKD, and history of anemia presented with profound anemia, weakness and hypotension.  1. Chronic systolic CHF: Patient has a nonischemic cardiomyopathy. Echo EF 15-20% Grade II DD. NYHA class III symptoms. RHC (10/15) showed low filling pressures and normal cardiac output.  She is unable to do CPX testing and would not be LVAD candidate.  Creatinine back down to actually below her baseline now.  She does not look volume overloaded. BP stable. - Continue current Coreg, hydralazine/Imdur - I am going to keep her off lisinopril with significant CKD.  No spironolactone.  - Digoxin every other day with level 1.1. - Continue Lasix 60 mg daily.  2. Atrial fibrillation: Paroxysmal, not in afib this admission.  - She will continue amiodarone for now. TSH high, Levoxyl begun. - She has been on coumadin, resume after GI workup completed. 3. AKI on CKD: Improved. Will keep off lisinopril.    4. Anemia: She had EGD and c-scope in 12/14. In 12/15, she had transfusion but apparently no-showed for her GI workup. Seen by hematology this admission, suspect normocytic anemia is most likely due to combination of CKD and hypothyroidism, less likely MDS.  Erythropoeitin being considered.  She has had 5 units PRBCs. I am concerned about possible GI bleeding with melenotic stool on 5/1.  Had EGD this admission, no bleeding source.  Colonoscopy today.   Loralie Champagne  10/24/2014 9:02 AM

## 2014-10-24 NOTE — Progress Notes (Signed)
Subjective: This morning, her husband is present at bedside. She denies any complaints Is scheduled for study later today.  Objective: Vital signs in last 24 hours: Filed Vitals:   10/24/14 1315 10/24/14 1330 10/24/14 1340 10/24/14 1350  BP: 110/67 105/55 92/46 97/47   Pulse: 70 70 70 70  Temp:      TempSrc:      Resp: 19 15 15 15   Height:      Weight:      SpO2: 100% 98% 98% 99%   Weight change: -3.2 oz (-0.091 kg)  Intake/Output Summary (Last 24 hours) at 10/24/14 1353 Last data filed at 10/23/14 2100  Gross per 24 hour  Intake    350 ml  Output      0 ml  Net    350 ml   Exam largely stable from yesterday [10/23/14]  General: Elderly Caucasian female, resting in bed, NAD HEENT: PERRL, EOMI, no scleral icterus, oropharynx clear Cardiac: Systolic ejection murmur best appreciated the left upper sternal border Pulm: clear to auscultation bilaterally, no wheezes, rales, or rhonchi Abd: soft, nontender, nondistended, BS present Ext: warm and well perfused, no pedal edema Neuro: responds to questions appropriately; moving all extremities freely   Lab Results: Basic Metabolic Panel:  Recent Labs Lab 10/23/14 0344 10/24/14 0420  NA 139 140  K 3.7 3.3*  CL 106 106  CO2 26 24  GLUCOSE 74 68*  BUN 33* 24*  CREATININE 1.86* 1.61*  CALCIUM 9.3 9.2   CBC:  Recent Labs Lab 10/23/14 0344 10/24/14 0420  WBC 6.2 5.3  HGB 9.4* 9.3*  HCT 27.7* 28.1*  MCV 89.1 91.8  PLT 198 204   CBG:  Recent Labs Lab 10/23/14 0752 10/23/14 1157 10/23/14 1650 10/23/14 2124 10/24/14 0807 10/24/14 1233  GLUCAP 75 140* 149* 161* 72 78   Hemoglobin A1C:  Recent Labs Lab 10/17/14 1909  HGBA1C 9.6*   Coagulation:  Recent Labs Lab 10/22/14 0535 10/22/14 1800 10/23/14 0344 10/24/14 0420  LABPROT 21.7* 19.4* 18.4* 16.7*  INR 1.88* 1.62* 1.51* 1.34   Anemia Panel:  Recent Labs Lab 10/18/14 0850  VITAMINB12 511  FOLATE 8.4  FERRITIN 18  TIBC 279  IRON 86    RETICCTPCT 2.1    Micro Results: Recent Results (from the past 240 hour(s))  MRSA PCR Screening     Status: Abnormal   Collection Time: 10/17/14  6:09 PM  Result Value Ref Range Status   MRSA by PCR POSITIVE (A) NEGATIVE Final    Comment:        The GeneXpert MRSA Assay (FDA approved for NASAL specimens only), is one component of a comprehensive MRSA colonization surveillance program. It is not intended to diagnose MRSA infection nor to guide or monitor treatment for MRSA infections. RESULT CALLED TO, READ BACK BY AND VERIFIED WITH: Carley Hammed RN 0960 10/17/14 A BROWNING    Studies/Results: No results found. Medications: I have reviewed the patient's current medications. Scheduled Meds: . [MAR Hold] amiodarone  200 mg Oral Daily  . [MAR Hold] carvedilol  6.25 mg Oral BID WC  . [MAR Hold] darbepoetin (ARANESP) injection - NON-DIALYSIS  40 mcg Subcutaneous Q Fri-1800  . [MAR Hold] digoxin  0.0625 mg Oral QODAY  . [MAR Hold] docusate sodium  100 mg Oral Daily  . [MAR Hold] feeding supplement (ENSURE ENLIVE)  237 mL Oral BID BM  . [MAR Hold] furosemide  60 mg Oral Daily  . [MAR Hold] hydrALAZINE  12.5 mg Oral TID  . [  MAR Hold] insulin aspart  0-15 Units Subcutaneous TID WC  . [MAR Hold] insulin aspart  0-5 Units Subcutaneous QHS  . [MAR Hold] insulin detemir  22 Units Subcutaneous QHS  . [MAR Hold] isosorbide mononitrate  30 mg Oral Daily  . [MAR Hold] levothyroxine  25 mcg Oral QAC breakfast  . [MAR Hold] pantoprazole (PROTONIX) IV  40 mg Intravenous Q24H  . [MAR Hold] polyethylene glycol  17 g Oral Daily  . [MAR Hold] pravastatin  80 mg Oral QHS   Continuous Infusions: . sodium chloride 500 mL (10/24/14 1226)   PRN Meds:.[MAR Hold] acetaminophen **OR** [MAR Hold] acetaminophen, [MAR Hold] ondansetron **OR** [MAR Hold] ondansetron (ZOFRAN) IV, [MAR Hold] oxyCODONE-acetaminophen, [MAR Hold] simethicone Assessment/Plan: Principal Problem:   Anemia, normocytic  normochromic Active Problems:   Cardiomyopathy, nonischemic   Diabetes mellitus, type 2   CHF (congestive heart failure)   Hypotension   Acute posthemorrhagic anemia   Nonspecific abnormal finding in stool contents  [name] is a 69 year old woman with history of symptomatic anemia, CKD stage III, type 2 diabetes, nonischemic cardiomyopathy status post ICD placement, paroxysmal atrial fibrillation hospitalized for symptomatic normocytic anemia.  Symptomatic normocytic anemia: Per Dr. Beryle Beams [Hematology], it may be secondary to chronic kidney disease. Certainly this is a mixed picture given suspected blood loss. Colonoscopy performed today notable only for polyp and diverticula. -Consider capsule endoscopy per GI -Follow-up endoscopy biopsy results -Consider erythropoietin stimulating agents should anemia worsen per hematology recommendations -Continue ambulating patient as tolerated  Nonischemic cardiomyopathy: EF 15-20% with grade 2 diastolic dysfunction on echo 4/27. Status post ICD placement on August 2014. Home medications include Coreg 12.5 mg twice daily, lisinopril 2.5 mg daily. -Holding lisinopril given elevated creatinine -Continue digoxin, Lasix, Imdur, Coreg -Cardiology following, appreciate recommendations   Chronic kidney disease stage III: Creatinine 1.6 which appears to be her closer to her baseline prior to July 2015.  -Holding lisinopril as noted above  Type 2 diabetes: A1c is 9.6 on admission. CBGs trending  to high variable with several values below 100 along with mid to low 100s. -Continue sliding scale insulin -Continue Levemir 22 units at bedtime  Hypothyroidism: TSH 19.7 on admission. -Continue Synthroid 25 g daily  Paroxysmal atrial fibrillation: Her medications include amiodarone 200 mg daily, Coreg 12.5 mg twice daily, digoxin 0.5 mg daily, Coumadin. -Holding warfarin for possible procedure -Other medications noted above  #FEN:  -Diet: Heart  healthy/carb modified   #DVT prophylaxis: Coumadin   #CODE STATUS: FULL CODE  Dispo: Disposition is deferred at this time, awaiting improvement of current medical problems.    The patient does have a current PCP Lemmie Evens, MD) and does not need an [facility] hospital follow-up appointment after discharge.  The patient does not have transportation limitations that hinder transportation to clinic appointments.  .Services Needed at time of discharge: Y = Yes, Blank = No PT:   OT:   RN:   Equipment:   Other:     LOS: 7 days   Riccardo Dubin, MD 10/24/2014, 1:53 PM

## 2014-10-24 NOTE — Progress Notes (Signed)
PT Cancellation Note  Patient Details Name: [name] MRN: [id number] DOB: 18-Jul-1945   Cancelled Treatment:    Reason Eval/Treat Not Completed: Patient at procedure or test/unavailable; patient in endoscopy.  Will attempt to see tomorrow.   WYNN,CYNDI 10/24/2014, 1:34 PM  [name], [place] 10/24/2014

## 2014-10-24 NOTE — Interval H&P Note (Signed)
History and Physical Interval Note:  10/24/2014 12:41 PM  [name]  has presented today for surgery, with the diagnosis of Melena, rectal bleeding, anemia  The various methods of treatment have been discussed with the patient and family. After consideration of risks, benefits and other options for treatment, the patient has consented to  Procedure(s): COLONOSCOPY (Left) as a surgical intervention .  The patient's history has been reviewed, patient examined, no change in status, stable for surgery.  I have reviewed the patient's chart and labs.  Questions were answered to the patient's satisfaction.     Lizzeth Meder D

## 2014-10-24 NOTE — Op Note (Signed)
[facility] [place], [id number]   COLONOSCOPY PROCEDURE REPORT  PATIENT: [name], [name]  MR#: [id number] BIRTHDATE: 1946/01/18 , 4  yrs. old GENDER: female ENDOSCOPIST: Carol Ada, MD REFERRED BY: PROCEDURE DATE:  Nov 20, 2014 PROCEDURE:   Colonoscopy with snare polypectomy ASA CLASS:   Class III INDICATIONS: IDA MEDICATIONS: Versed 6 mg IV and Fentanyl 62.5 mcg IV  DESCRIPTION OF PROCEDURE:   After the risks and benefits and of the procedure were explained, informed consent was obtained.  revealed no abnormalities of the rectum.    The Pentax Adult Colon [id number] endoscope was introduced through the anus and advanced to the cecum, which was identified by both the appendix and ileocecal valve .  The quality of the prep was good. .  The instrument was then slowly withdrawn as the colon was fully examined. Estimated blood loss is zero unless otherwise noted in this procedure report.    FINDINGS: A 3 mm sessile ascending colon polyp was identified and removed with a cold snare.  Scattered diverticula were found in the distal transverse, descending, and sigmioid colons.  No evidence of any inflammation, ulcerations, erosions, or vascular abnormalities. I did note that some of the residual stool balls were mixed with blood.            The scope was then withdrawn from the patient and the procedure completed.  WITHDRAWAL TIME: 15 minutes  COMPLICATIONS: There were no immediate complications. ENDOSCOPIC IMPRESSION: 1) Polyp. 2) Diverticula.  RECOMMENDATIONS: 1) Follow up biopsy. 2) Capsule endoscopy.  REPEAT EXAM:  cc:  _______________________________ eSignedCarol Ada, MD 11-20-2014 1:51 PM   CPT CODES: ICD CODES:  The ICD and CPT codes recommended by this software are interpretations from the data that the clinical staff has captured with the software.  The verification of the translation of this report to the ICD  and CPT codes and modifiers is the sole responsibility of the health care institution and practicing physician where this report was generated.  [employer] will not be held responsible for the validity of the ICD and CPT codes included on this report.  AMA assumes no liability for data contained or not contained herein. CPT is a Designer, television/film set of the [employer].   PATIENT NAME:  Carsen, Machi MR#: [id number]

## 2014-10-25 ENCOUNTER — Inpatient Hospital Stay (HOSPITAL_COMMUNITY): Admission: RE | Admit: 2014-10-25 | Payer: Commercial Managed Care - HMO | Source: Ambulatory Visit

## 2014-10-25 ENCOUNTER — Encounter (HOSPITAL_COMMUNITY): Payer: Self-pay | Admitting: Gastroenterology

## 2014-10-25 LAB — CBC
HEMATOCRIT: 28.2 % — AB (ref 36.0–46.0)
HEMOGLOBIN: 9.3 g/dL — AB (ref 12.0–15.0)
MCH: 30.4 pg (ref 26.0–34.0)
MCHC: 33 g/dL (ref 30.0–36.0)
MCV: 92.2 fL (ref 78.0–100.0)
Platelets: 237 10*3/uL (ref 150–400)
RBC: 3.06 MIL/uL — ABNORMAL LOW (ref 3.87–5.11)
RDW: 15.8 % — ABNORMAL HIGH (ref 11.5–15.5)
WBC: 4.8 10*3/uL (ref 4.0–10.5)

## 2014-10-25 LAB — GLUCOSE, CAPILLARY
GLUCOSE-CAPILLARY: 73 mg/dL (ref 70–99)
Glucose-Capillary: 210 mg/dL — ABNORMAL HIGH (ref 70–99)
Glucose-Capillary: 191 mg/dL — ABNORMAL HIGH (ref 70–99)
Glucose-Capillary: 200 mg/dL — ABNORMAL HIGH (ref 70–99)

## 2014-10-25 LAB — BASIC METABOLIC PANEL
Anion gap: 10 (ref 5–15)
BUN: 22 mg/dL — AB (ref 6–20)
CO2: 22 mmol/L (ref 22–32)
Calcium: 8.9 mg/dL (ref 8.9–10.3)
Chloride: 106 mmol/L (ref 101–111)
Creatinine, Ser: 1.81 mg/dL — ABNORMAL HIGH (ref 0.44–1.00)
GFR calc Af Amer: 32 mL/min — ABNORMAL LOW (ref >60)
GFR calc non Af Amer: 27 mL/min — ABNORMAL LOW (ref >60)
GLUCOSE: 101 mg/dL — AB (ref 70–99)
POTASSIUM: 4 mmol/L (ref 3.5–5.1)
SODIUM: 138 mmol/L (ref 135–145)

## 2014-10-25 LAB — PROTIME-INR
INR: 1.36 (ref 0.00–1.49)
PROTHROMBIN TIME: 16.9 s — AB (ref 11.6–15.2)

## 2014-10-25 LAB — ERYTHROPOIETIN: Erythropoietin: 17.2 m[IU]/mL (ref 2.6–18.5)

## 2014-10-25 MED ORDER — INSULIN DETEMIR 100 UNIT/ML ~~LOC~~ SOLN
20.0000 [IU] | Freq: Every day | SUBCUTANEOUS | Status: DC
  Administered 2014-10-25: 20 [IU] via SUBCUTANEOUS
  Filled 2014-10-25 (×2): qty 0.2

## 2014-10-25 NOTE — Progress Notes (Signed)
UNASSIGNED PATIENT Subjective: Since I last evaluated the patient, she has been hemodynamically stable.   Objective: Vital signs in last 24 hours: Temp:  [98 F (36.7 C)-98.6 F (37 C)] 98.4 F (36.9 C) (05/04 1544) Pulse Rate:  [70] 70 (05/04 1544) Resp:  [18] 18 (05/04 0500) BP: (121-128)/(58-62) 124/58 mmHg (05/04 1544) SpO2:  [96 %-99 %] 99 % (05/04 1544) Weight:  [70.308 kg (155 lb)] 70.308 kg (155 lb) (05/04 0500) Last BM Date: 10/24/14  Intake/Output from previous day: 05/03 0701 - 05/04 0700 In: -  Out: 250 [Urine:250] Intake/Output this shift:    General appearance: alert, cooperative, appears stated age and no distress Resp: clear to auscultation bilaterally Cardio: regular rate and rhythm, S1, S2 normal, no murmur, click, rub or gallop GI: soft, non-tender; bowel sounds normal; no masses,  no organomegaly  Lab Results:  Recent Labs  10/23/14 0344 10/24/14 0420 10/25/14 0450  WBC 6.2 5.3 4.8  HGB 9.4* 9.3* 9.3*  HCT 27.7* 28.1* 28.2*  PLT 198 204 237   BMET  Recent Labs  10/23/14 0344 10/24/14 0420 10/25/14 0450  NA 139 140 138  K 3.7 3.3* 4.0  CL 106 106 106  CO2 26 24 22   GLUCOSE 74 68* 101*  BUN 33* 24* 22*  CREATININE 1.86* 1.61* 1.81*  CALCIUM 9.3 9.2 8.9   PT/INR  Recent Labs  10/24/14 0420 10/25/14 0450  LABPROT 16.7* 16.9*  INR 1.34 1.36   Medications: I have reviewed the patient's current medications.  Assessment/Plan: Iron deficiency anemia with blood in stool: few scattered lymphangiectasias noted on the small bowel capsule study but no source of blood loss identified. Small lesions could be missed as she had some debris in the small bowel. No old or fresh heme noted on in the small bowel.   LOS: 8 days   Edina Winningham 10/25/2014, 5:16 PM

## 2014-10-25 NOTE — Progress Notes (Signed)
Patient ID: [name], female   DOB: March 15, 1946, 69 y.o.   MRN: [id number] Hematology: Hb stable since last transfusion given on 5/1. Colonoscopy findings noted.  Capsule endoscopy planned. EPO level low so good chance for a response to an ESA. I see that Aranesp was started. Decision on when to resume anticoagulation difficult. High CHADS2Vasc score; thrombotic risk high. Currently in a paced rhythm. May need to have her Cardiologist weigh in on decision. Continue to hold on short term basis pending capsule endo findings. Bleeding from GI tract if secondary to AVMs will be a chronic issue.   No new recs at this time.

## 2014-10-25 NOTE — Progress Notes (Signed)
Subjective: This morning, her husband is present at bedside. She denies any complaints reports tolerating ambulation without difficulty. We discussed the findings of her colonoscopy yesterday and how the risks outweigh the benefits of continuing on anticoagulation.  Objective: Vital signs in last 24 hours: Filed Vitals:   10/24/14 1350 10/24/14 1400 10/24/14 2023 10/25/14 0500  BP: 97/47 101/51 128/62 121/58  Pulse: 70 69 70 70  Temp:   98 F (36.7 C) 98.6 F (37 C)  TempSrc:   Oral Oral  Resp: 15 15 18 18   Height:      Weight:    155 lb (70.308 kg)  SpO2: 99% 99% 98% 96%   Weight change: 6 lb 1.6 oz (2.767 kg)  Intake/Output Summary (Last 24 hours) at 10/25/14 1120 Last data filed at 10/24/14 2029  Gross per 24 hour  Intake      0 ml  Output    250 ml  Net   -250 ml   Exam largely stable from yesterday [10/24/14]  General: Elderly Caucasian female, resting in bed, NAD HEENT: PERRL, EOMI, no scleral icterus, oropharynx clear Cardiac: Systolic ejection murmur best appreciated the left upper sternal border Pulm: clear to auscultation bilaterally, no wheezes, rales, or rhonchi Abd: soft, nontender, nondistended, BS present Ext: warm and well perfused, no pedal edema Neuro: responds to questions appropriately; moving all extremities freely  Lab Results: Basic Metabolic Panel:  Recent Labs Lab 10/24/14 0420 10/25/14 0450  NA 140 138  K 3.3* 4.0  CL 106 106  CO2 24 22  GLUCOSE 68* 101*  BUN 24* 22*  CREATININE 1.61* 1.81*  CALCIUM 9.2 8.9   CBC:  Recent Labs Lab 10/24/14 0420 10/25/14 0450  WBC 5.3 4.8  HGB 9.3* 9.3*  HCT 28.1* 28.2*  MCV 91.8 92.2  PLT 204 237   CBG:  Recent Labs Lab 10/24/14 0807 10/24/14 1233 10/24/14 1447 10/24/14 1651 10/24/14 2032 10/25/14 0803  GLUCAP 72 78 87 77 208* 73   Coagulation:  Recent Labs Lab 10/22/14 1800 10/23/14 0344 10/24/14 0420 10/25/14 0450  LABPROT 19.4* 18.4* 16.7* 16.9*  INR 1.62* 1.51* 1.34  1.36   Micro Results: Recent Results (from the past 240 hour(s))  MRSA PCR Screening     Status: Abnormal   Collection Time: 10/17/14  6:09 PM  Result Value Ref Range Status   MRSA by PCR POSITIVE (A) NEGATIVE Final    Comment:        The GeneXpert MRSA Assay (FDA approved for NASAL specimens only), is one component of a comprehensive MRSA colonization surveillance program. It is not intended to diagnose MRSA infection nor to guide or monitor treatment for MRSA infections. RESULT CALLED TO, READ BACK BY AND VERIFIED WITH: Carley Hammed RN [id number] 10/17/14 A BROWNING    Studies/Results: No results found. Medications: I have reviewed the patient's current medications. Scheduled Meds: . amiodarone  200 mg Oral Daily  . carvedilol  6.25 mg Oral BID WC  . darbepoetin (ARANESP) injection - NON-DIALYSIS  40 mcg Subcutaneous Q Fri-1800  . digoxin  0.0625 mg Oral QODAY  . docusate sodium  100 mg Oral Daily  . feeding supplement (ENSURE ENLIVE)  237 mL Oral BID BM  . furosemide  60 mg Oral Daily  . hydrALAZINE  12.5 mg Oral TID  . insulin aspart  0-15 Units Subcutaneous TID WC  . insulin aspart  0-5 Units Subcutaneous QHS  . insulin detemir  22 Units Subcutaneous QHS  . isosorbide mononitrate  30 mg Oral Daily  . levothyroxine  25 mcg Oral QAC breakfast  . pantoprazole (PROTONIX) IV  40 mg Intravenous Q24H  . polyethylene glycol  17 g Oral Daily  . pravastatin  80 mg Oral QHS   Continuous Infusions: . sodium chloride     PRN Meds:.acetaminophen **OR** acetaminophen, ondansetron **OR** ondansetron (ZOFRAN) IV, oxyCODONE-acetaminophen, simethicone Assessment/Plan: Principal Problem:   Anemia, normocytic normochromic Active Problems:   Cardiomyopathy, nonischemic   Diabetes mellitus, type 2   CHF (congestive heart failure)   Hypotension   Acute posthemorrhagic anemia   Nonspecific abnormal finding in stool contents  Ms. Okun is a 69 year old woman with history of symptomatic  anemia, CKD stage III, type 2 diabetes, nonischemic cardiomyopathy status post ICD placement, paroxysmal atrial fibrillation hospitalized for symptomatic normocytic anemia of multifactorial etiology.  Symptomatic normocytic anemia: Likely secondary to some GI bleeding in the setting of her her chronic disease [amiodarone induced thyroid dysfunction, chronic kidney disease].  -Follow-up capsule endoscopy per GI -Follow-up endoscopy biopsy results -Consider erythropoietin stimulating agents should anemia worsen per hematology recommendations -Continue ambulating patient as tolerated  Nonischemic cardiomyopathy: EF 15-20% with grade 2 diastolic dysfunction on echo 4/27. Status post ICD placement on August 2014. Home medications include Coreg 12.5 mg twice daily, lisinopril 2.5 mg daily. -Holding lisinopril given elevated creatinine -Continue digoxin, Lasix, Imdur, Coreg -Cardiology following, appreciate recommendations   Chronic kidney disease stage III: Creatinine 1.8 which appears closer to her baseline over this year of 1.6-1.8.  -Holding lisinopril as noted above  Type 2 diabetes: A1c is 9.6 on admission. CBGs trending  mostly 70-80 with a few values above 100. -Continue sliding scale insulin -Decrease Levemir 22->20 units at bedtime  Hypothyroidism: TSH 19.7 on admission. -Continue Synthroid 25 g daily  Paroxysmal atrial fibrillation: Her medications include amiodarone 200 mg daily, Coreg 12.5 mg twice daily, digoxin 0.5 mg daily, Coumadin. -Holding warfarin for possible procedure -Other medications noted above  #FEN:  -Diet: Heart healthy/carb modified   #DVT prophylaxis: Coumadin   #CODE STATUS: FULL CODE  Dispo: Disposition is deferred at this time, awaiting improvement of current medical problems.    The patient does have a current PCP Lemmie Evens, MD) and does not need an [facility] hospital follow-up appointment after discharge.  The patient does not have transportation  limitations that hinder transportation to clinic appointments.  .Services Needed at time of discharge: Y = Yes, Blank = No PT:   OT:   RN:   Equipment:   Other:     LOS: 8 days   Riccardo Dubin, MD 10/25/2014, 11:20 AM

## 2014-10-25 NOTE — Progress Notes (Signed)
  Date: 10/25/2014  Patient name: [name]  Medical record number: [id number]  Date of birth: 1946-03-10   This patient's plan of care was discussed with the house staff. Please see Dr. Serita Grit note for complete details. I concur with his findings.  Ms. Prest is doing well this morning, ambulated with assistance of nursing.  She had a colonoscopy yesterday.  Capsule endoscopy reading is pending.  Her H/H are stable currently.  Major decision about anticoagulation will need to be made regarding her afib.  She was on coumadin previously, but source of bleeding is still being sought.  She will likely be inpatient for another day or so.     Sid Falcon, MD 10/25/2014, 2:03 PM

## 2014-10-25 NOTE — Progress Notes (Signed)
Physical Therapy Treatment Patient Details Name: [name] MRN: [id number] DOB: August 27, 1945 Today's Date: 10/25/2014    History of Present Illness Patient is a 69 year old with a history of symptomatic anemia, chronic kidney disease, type 2 diabetes, nonischemic cardiomyopathy status post ICD placement, paroxysmal atrial fibrillation who presents with symptomatic anemia and hypotension    PT Comments    Pt progressing well. Antalgic gait noted today due to gout R great toe.   Follow Up Recommendations  Home health PT;Supervision - Intermittent     Equipment Recommendations  None recommended by PT    Recommendations for Other Services       Precautions / Restrictions Precautions Precautions: Fall    Mobility  Bed Mobility Overal bed mobility: Modified Independent                Transfers   Equipment used: Rolling walker (2 wheeled)   Sit to Stand: Supervision            Ambulation/Gait Ambulation/Gait assistance: Supervision Ambulation Distance (Feet): 200 Feet Assistive device: 4-wheeled walker Gait Pattern/deviations: Antalgic;Step-through pattern     General Gait Details: good speed and generally safe technique; seems weak overall, but denies   Science writer    Modified Rankin (Stroke Patients Only)       Balance                                    Cognition Arousal/Alertness: Awake/alert Behavior During Therapy: WFL for tasks assessed/performed Overall Cognitive Status: Within Functional Limits for tasks assessed                      Exercises      General Comments        Pertinent Vitals/Pain Pain Assessment: 0-10 Pain Score: 8  Pain Location: right great toe due to gout flare up Pain Intervention(s): Limited activity within patient's tolerance    Home Living                      Prior Function            PT Goals (current goals can now be found in the care  plan section) Progress towards PT goals: Progressing toward goals    Frequency  Min 3X/week    PT Plan Current plan remains appropriate    Co-evaluation             End of Session Equipment Utilized During Treatment: Gait belt Activity Tolerance: Patient tolerated treatment well Patient left: in bed;with family/visitor present;with call bell/phone within reach     Time: 0947-1010 PT Time Calculation (min) (ACUTE ONLY): 23 min  Charges:  $Gait Training: 23-37 mins                    G Codes:      Lorriane Shire 10/25/2014, 10:15 AM

## 2014-10-26 DIAGNOSIS — I10 Essential (primary) hypertension: Secondary | ICD-10-CM | POA: Insufficient documentation

## 2014-10-26 LAB — PROTIME-INR
INR: 1.19 (ref 0.00–1.49)
Prothrombin Time: 15.2 seconds (ref 11.6–15.2)

## 2014-10-26 LAB — CBC
HEMATOCRIT: 28.7 % — AB (ref 36.0–46.0)
HEMOGLOBIN: 9.4 g/dL — AB (ref 12.0–15.0)
MCH: 30.2 pg (ref 26.0–34.0)
MCHC: 32.8 g/dL (ref 30.0–36.0)
MCV: 92.3 fL (ref 78.0–100.0)
Platelets: 222 10*3/uL (ref 150–400)
RBC: 3.11 MIL/uL — AB (ref 3.87–5.11)
RDW: 15.8 % — ABNORMAL HIGH (ref 11.5–15.5)
WBC: 5.2 10*3/uL (ref 4.0–10.5)

## 2014-10-26 LAB — COMPREHENSIVE METABOLIC PANEL
ALBUMIN: 3.1 g/dL — AB (ref 3.5–5.0)
ALT: 11 U/L — ABNORMAL LOW (ref 14–54)
AST: 13 U/L — AB (ref 15–41)
Alkaline Phosphatase: 62 U/L (ref 38–126)
Anion gap: 7 (ref 5–15)
BUN: 26 mg/dL — ABNORMAL HIGH (ref 6–20)
CALCIUM: 9 mg/dL (ref 8.9–10.3)
CO2: 26 mmol/L (ref 22–32)
CREATININE: 1.94 mg/dL — AB (ref 0.44–1.00)
Chloride: 104 mmol/L (ref 101–111)
GFR calc Af Amer: 29 mL/min — ABNORMAL LOW (ref >60)
GFR calc non Af Amer: 25 mL/min — ABNORMAL LOW (ref >60)
Glucose, Bld: 137 mg/dL — ABNORMAL HIGH (ref 70–99)
Potassium: 3.7 mmol/L (ref 3.5–5.1)
Sodium: 137 mmol/L (ref 135–145)
TOTAL PROTEIN: 5.9 g/dL — AB (ref 6.5–8.1)
Total Bilirubin: 0.9 mg/dL (ref 0.3–1.2)

## 2014-10-26 LAB — GLUCOSE, CAPILLARY
GLUCOSE-CAPILLARY: 121 mg/dL — AB (ref 70–99)
Glucose-Capillary: 166 mg/dL — ABNORMAL HIGH (ref 70–99)

## 2014-10-26 MED ORDER — CARVEDILOL 3.125 MG PO TABS: 9.3750 mg | ORAL_TABLET | Freq: Two times a day (BID) | ORAL | Status: DC

## 2014-10-26 MED ORDER — WARFARIN SODIUM 2.5 MG PO TABS: 1.2500 mg | ORAL_TABLET | Freq: Once | ORAL | Status: DC

## 2014-10-26 MED ORDER — ISOSORBIDE MONONITRATE ER 30 MG PO TB24: 30.0000 mg | ORAL_TABLET | Freq: Every day | ORAL | Status: AC

## 2014-10-26 MED ORDER — ENSURE ENLIVE PO LIQD: 237.0000 mL | Freq: Two times a day (BID) | ORAL | Status: DC

## 2014-10-26 MED ORDER — COUMADIN BOOK
Freq: Once | Status: AC
  Administered 2014-10-26: 11:00:00
  Filled 2014-10-26: qty 1

## 2014-10-26 MED ORDER — WARFARIN SODIUM 2.5 MG PO TABS: 1.2500 mg | ORAL_TABLET | Freq: Every day | ORAL | Status: DC

## 2014-10-26 MED ORDER — WARFARIN VIDEO: Freq: Once | Status: DC

## 2014-10-26 MED ORDER — LEVOTHYROXINE SODIUM 25 MCG PO TABS: 25.0000 ug | ORAL_TABLET | Freq: Every day | ORAL | Status: DC

## 2014-10-26 MED ORDER — DIGOXIN 125 MCG PO TABS: 0.0625 mg | ORAL_TABLET | ORAL | Status: DC

## 2014-10-26 MED ORDER — CARVEDILOL 6.25 MG PO TABS: 9.3750 mg | ORAL_TABLET | Freq: Two times a day (BID) | ORAL | Status: DC

## 2014-10-26 MED ORDER — WARFARIN - PHARMACIST DOSING INPATIENT: Freq: Every day | Status: DC

## 2014-10-26 NOTE — Progress Notes (Signed)
  Date: 10/26/2014  Patient name: [name]  Medical record number: [id number]  Date of birth: 1946-02-25   This patient's plan of care was discussed with the house staff. Please see Dr. Serita Grit note for complete details. I concur with his findings.  Discussed plan with housestaff.  Ms. Normal will be discharged home today.    Sid Falcon, MD 10/26/2014, 2:18 PM

## 2014-10-26 NOTE — Progress Notes (Signed)
Medicare Important Message given? YES   (If response is "NO", the following Medicare IM given date fields will be blank)   Date Medicare IM given:   Medicare IM given by: Graves-Bigelow, Hayze Gazda  

## 2014-10-26 NOTE — Progress Notes (Signed)
Patient ID: [name], female   DOB: Nov 08, 1945, 68 y.o.   MRN: [id number]   SUBJECTIVE: Patient was admitted with profound anemia (hgb 6.4), weakness, and hypotension.  She has received a total of 5 units PRBCs.  Diuretics held with initial AKI and hypotension, back on Lasix now.    GI workup: EGD without bleeding source, capsule endoscopy without bleeding source, colonoscopy with polyps and polypectomy but no definite bleeding source.   No further melena.  No lightheadedness. No dyspnea.  Hemoglobin stable today.   Creatinine 2.46>2.1>1.87>1.86>1.6>1.9  ECHO EF 15-20% Grade II D, mild MR, mild to moderate AI, RV normal.   Scheduled Meds: . amiodarone  200 mg Oral Daily  . carvedilol  9.375 mg Oral BID WC  . darbepoetin (ARANESP) injection - NON-DIALYSIS  40 mcg Subcutaneous Q Fri-1800  . digoxin  0.0625 mg Oral QODAY  . docusate sodium  100 mg Oral Daily  . feeding supplement (ENSURE ENLIVE)  237 mL Oral BID BM  . furosemide  60 mg Oral Daily  . hydrALAZINE  12.5 mg Oral TID  . insulin aspart  0-15 Units Subcutaneous TID WC  . insulin aspart  0-5 Units Subcutaneous QHS  . insulin detemir  20 Units Subcutaneous QHS  . isosorbide mononitrate  30 mg Oral Daily  . levothyroxine  25 mcg Oral QAC breakfast  . pantoprazole (PROTONIX) IV  40 mg Intravenous Q24H  . polyethylene glycol  17 g Oral Daily  . pravastatin  80 mg Oral QHS   Continuous Infusions: . sodium chloride     PRN Meds:.acetaminophen **OR** acetaminophen, ondansetron **OR** ondansetron (ZOFRAN) IV, oxyCODONE-acetaminophen, simethicone   Filed Vitals:   10/25/14 0500 10/25/14 1544 10/25/14 2020 10/26/14 0500  BP: 121/58 124/58 130/66 128/68  Pulse: 70 70 71 74  Temp: 98.6 F (37 C) 98.4 F (36.9 C) 98.9 F (37.2 C) 98 F (36.7 C)  TempSrc: Oral Oral Oral Oral  Resp: 18  18 18   Height:      Weight: 155 lb (70.308 kg)   153 lb 14.4 oz (69.809 kg)  SpO2: 96% 99% 97% 98%    Intake/Output Summary (Last 24  hours) at 10/26/14 0916 Last data filed at 10/26/14 0814  Gross per 24 hour  Intake    120 ml  Output      0 ml  Net    120 ml    LABS: Basic Metabolic Panel:  Recent Labs  10/25/14 0450 10/26/14 0456  NA 138 137  K 4.0 3.7  CL 106 104  CO2 22 26  GLUCOSE 101* 137*  BUN 22* 26*  CREATININE 1.81* 1.94*  CALCIUM 8.9 9.0   Liver Function Tests:  Recent Labs  10/26/14 0456  AST 13*  ALT 11*  ALKPHOS 62  BILITOT 0.9  PROT 5.9*  ALBUMIN 3.1*   No results for input(s): LIPASE, AMYLASE in the last 72 hours. CBC:  Recent Labs  10/25/14 0450 10/26/14 0456  WBC 4.8 5.2  HGB 9.3* 9.4*  HCT 28.2* 28.7*  MCV 92.2 92.3  PLT 237 222   Cardiac Enzymes: No results for input(s): CKTOTAL, CKMB, CKMBINDEX, TROPONINI in the last 72 hours. BNP: Invalid input(s): POCBNP D-Dimer: No results for input(s): DDIMER in the last 72 hours. Hemoglobin A1C: No results for input(s): HGBA1C in the last 72 hours. Fasting Lipid Panel: No results for input(s): CHOL, HDL, LDLCALC, TRIG, CHOLHDL, LDLDIRECT in the last 72 hours. Thyroid Function Tests: No results for input(s): TSH, T4TOTAL, T3FREE,  THYROIDAB in the last 72 hours.  Invalid input(s): FREET3 Anemia Panel: No results for input(s): VITAMINB12, FOLATE, FERRITIN, TIBC, IRON, RETICCTPCT in the last 72 hours.  RADIOLOGY: Dg Chest 2 View  10/17/2014   CLINICAL DATA:  Dizziness, nausea 2 days  EXAM: CHEST  2 VIEW  COMPARISON:  01/10/2014  FINDINGS: There is no focal parenchymal opacity, pleural effusion, or pneumothorax. The heart and mediastinal contours are stable. There is a 3 lead AICD.  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  IMPRESSION: No active cardiopulmonary disease.   Electronically Signed   By: [name]   On: 10/17/2014 11:41    PHYSICAL EXAM General: NAD Neck: JVP 7 cm, no thyromegaly or thyroid nodule.  Lungs: Clear to auscultation bilaterally with normal respiratory effort. CV: Nondisplaced PMI.   Heart regular S1/S2, no S3/S4, 2/6 HSM LLSB/apex.  No peripheral edema.  No carotid bruit.  Normal pedal pulses.  Abdomen: Soft, nontender, no hepatosplenomegaly, no distention.  Neurologic: Alert and oriented x 3.  Psych: Normal affect. Extremities: No clubbing or cyanosis.   TELEMETRY: Reviewed telemetry pt in A-BiV paced  ASSESSMENT AND PLAN: 69 yo with nonischemic CMP/chronic systolic CHF, paroxysmal atrial fibrillation, CKD, and history of anemia presented with profound anemia, weakness and hypotension.  1. Chronic systolic CHF: Patient has a nonischemic cardiomyopathy. Echo EF 15-20% Grade II DD. NYHA class III symptoms. RHC (10/15) showed low filling pressures and normal cardiac output.  She is unable to do CPX testing and would not be LVAD candidate.  Creatinine is around baseline.  She does not look volume overloaded. BP stable. - Continue current hydralazine/Imdur - Can increase Coreg to 9.375 mg bid.  - I am going to keep her off lisinopril with significant CKD.  No spironolactone.  - Digoxin every other day with level 1.1. - Continue Lasix 60 mg daily.  2. Atrial fibrillation: Paroxysmal, not in afib this admission.  - She will continue amiodarone for now. TSH high, Levoxyl begun.  Will need followup on this with PCP.  - She has been on coumadin.  No definite GI bleeding source noted.  Will have her restart coumadin with goal INR 2-2.5.  Followed by PCP.  3. AKI on CKD: Creatinine at baseline, will keep off lisinopril and use hydralazine/Imdur instead.    4. Anemia: She had EGD and c-scope in 12/14. In 12/15, she had transfusion but apparently no-showed for her GI workup. Seen by hematology this admission, suspect normocytic anemia is most likely due to combination of CKD and hypothyroidism, less likely MDS.  Erythropoeitin started.  She has had 5 units PRBCs.  Full GI workup this admission has not shown a definite bleeding source.  Given high CHADSVASC score, would restart  coumadin aiming for INR 2-2.5. 5. Disposition: Plan for home today.  Will need followup in CHF clinic in 7-14 days.  Cardiac meds: Coumadin with INR 2-2.5, Coreg 9.375 mg bid, hydralazine 12.5 mg tid + Imdur 30 daily, digoxin 0.0625 every other day, Lasix 60 mg daily, amiodarone 200 mg daily, pravastatin 80 mg daily.    Loralie Champagne  10/26/2014 9:16 AM

## 2014-10-26 NOTE — Progress Notes (Signed)
Subjective: This morning, she denies any complaints. I explained to her the findings of her capsule endoscopy and that she would need to have a lower INR goal in the setting of her recent GI bleeding. I also spoke with her husband by phone and communicated this plan of action to him along with the adjusted warfarin dosing per pharmacy recommendations of 1.25 mg and have arranged for follow-up with cardiology next week. He would also like for me to speak with her PCP, Dr. Karie Kirks, and I will attempt to reach him by phone.  Objective: Vital signs in last 24 hours: Filed Vitals:   10/25/14 0500 10/25/14 1544 10/25/14 2020 10/26/14 0500  BP: 121/58 124/58 130/66 128/68  Pulse: 70 70 71 74  Temp: 98.6 F (37 C) 98.4 F (36.9 C) 98.9 F (37.2 C) 98 F (36.7 C)  TempSrc: Oral Oral Oral Oral  Resp: 18  18 18   Height:      Weight: 155 lb (70.308 kg)   153 lb 14.4 oz (69.809 kg)  SpO2: 96% 99% 97% 98%   Weight change: -1 lb 1.6 oz (-0.499 kg)  Intake/Output Summary (Last 24 hours) at 10/26/14 1136 Last data filed at 10/26/14 0814  Gross per 24 hour  Intake    120 ml  Output      0 ml  Net    120 ml   Exam largely stable from yesterday [10/25/14]  General: Elderly Caucasian female, resting in bed, NAD HEENT: PERRL, EOMI, no scleral icterus, oropharynx clear Cardiac: Systolic ejection murmur best appreciated the left upper sternal border Pulm: clear to auscultation bilaterally, no wheezes, rales, or rhonchi Abd: soft, nontender, nondistended, BS present Ext: warm and well perfused, no pedal edema Neuro: responds to questions appropriately; moving all extremities freely  Lab Results: Basic Metabolic Panel:  Recent Labs Lab 10/25/14 0450 10/26/14 0456  NA 138 137  K 4.0 3.7  CL 106 104  CO2 22 26  GLUCOSE 101* 137*  BUN 22* 26*  CREATININE 1.81* 1.94*  CALCIUM 8.9 9.0   CBC:  Recent Labs Lab 10/25/14 0450 10/26/14 0456  WBC 4.8 5.2  HGB 9.3* 9.4*  HCT 28.2* 28.7*    MCV 92.2 92.3  PLT 237 222   CBG:  Recent Labs Lab 10/25/14 0803 10/25/14 1209 10/25/14 1705 10/25/14 2024 10/26/14 0724 10/26/14 1120  GLUCAP 73 210* 191* 200* 121* 166*   Coagulation:  Recent Labs Lab 10/23/14 0344 10/24/14 0420 10/25/14 0450 10/26/14 0456  LABPROT 18.4* 16.7* 16.9* 15.2  INR 1.51* 1.34 1.36 1.19   Micro Results: Recent Results (from the past 240 hour(s))  MRSA PCR Screening     Status: Abnormal   Collection Time: 10/17/14  6:09 PM  Result Value Ref Range Status   MRSA by PCR POSITIVE (A) NEGATIVE Final    Comment:        The GeneXpert MRSA Assay (FDA approved for NASAL specimens only), is one component of a comprehensive MRSA colonization surveillance program. It is not intended to diagnose MRSA infection nor to guide or monitor treatment for MRSA infections. RESULT CALLED TO, READ BACK BY AND VERIFIED WITH: Carley Hammed RN [id number] 10/17/14 A BROWNING    Studies/Results: No results found. Medications: I have reviewed the patient's current medications. Scheduled Meds: . amiodarone  200 mg Oral Daily  . carvedilol  9.375 mg Oral BID WC  . coumadin book   Does not apply Once  . darbepoetin (ARANESP) injection - NON-DIALYSIS  40 mcg  Subcutaneous Q Fri-1800  . digoxin  0.0625 mg Oral QODAY  . docusate sodium  100 mg Oral Daily  . feeding supplement (ENSURE ENLIVE)  237 mL Oral BID BM  . furosemide  60 mg Oral Daily  . hydrALAZINE  12.5 mg Oral TID  . insulin aspart  0-15 Units Subcutaneous TID WC  . insulin aspart  0-5 Units Subcutaneous QHS  . insulin detemir  20 Units Subcutaneous QHS  . isosorbide mononitrate  30 mg Oral Daily  . levothyroxine  25 mcg Oral QAC breakfast  . pantoprazole (PROTONIX) IV  40 mg Intravenous Q24H  . polyethylene glycol  17 g Oral Daily  . pravastatin  80 mg Oral QHS  . warfarin  1.25 mg Oral ONCE-1800  . warfarin   Does not apply Once  . Warfarin - Pharmacist Dosing Inpatient   Does not apply q1800    Continuous Infusions: . sodium chloride     PRN Meds:.acetaminophen **OR** acetaminophen, ondansetron **OR** ondansetron (ZOFRAN) IV, oxyCODONE-acetaminophen, simethicone Assessment/Plan: Principal Problem:   Anemia, normocytic normochromic Active Problems:   Cardiomyopathy, nonischemic   Diabetes mellitus, type 2   CHF (congestive heart failure)   Hypotension   Acute posthemorrhagic anemia   Nonspecific abnormal finding in stool contents  [name] is a 69 year old woman with history of symptomatic anemia, CKD stage III, type 2 diabetes, nonischemic cardiomyopathy status post ICD placement, paroxysmal atrial fibrillation hospitalized for symptomatic normocytic anemia of multifactorial etiology.  Symptomatic normocytic anemia: Likely secondary to some GI bleeding in the setting of her her chronic disease [amiodarone induced thyroid dysfunction, chronic kidney disease]. Per Dr. Azucena Freed note today, she'll require Aranesp 300 g injections every 3 weeks but will need to establish with the nephrologist to do so.  Nonischemic cardiomyopathy: EF 15-20% with grade 2 diastolic dysfunction on echo 4/27. Status post ICD placement on August 2014.  -Arrange for follow-up with heart failure clinic -Decrease Coreg from 12.5 mg twice daily at 9.375 mg twice daily -Discharge on Imdur 30mg  -Holding lisinopril given elevated creatinine -Cardiology following, appreciate recommendations   Chronic kidney disease stage III: Creatinine 1.9 which appears closer to her baseline over this year of 1.6-1.8.  -Holding lisinopril as noted above  Type 2 diabetes: A1c is 9.6 on admission. CBGs trending  mostly mid to low 100s now on Levemir 20 units. Per conversations with the husband, he would like to defer insulin therapy and procurement of glucometer until her PCP is given the numerous changes that have been made to her other medications. He acknowledged understanding that he will establish follow-up with  her PCP as I been on accessible and attempting to reach him on several accounts.  Hypothyroidism: TSH 19.7 on admission. -Continue Synthroid 25 g daily  Paroxysmal atrial fibrillation: Her medications include amiodarone 200 mg daily, Coreg, digoxin, Coumadin. -Discharge on coumadin 1.25mg  daily  -Arranged for follow-up INR at heart failure appointment next week and continue attempting to reach PCP to schedule follow-up INR  #FEN:  -Diet: Heart healthy/carb modified   #DVT prophylaxis: Coumadin   #CODE STATUS: FULL CODE  Dispo: Disposition is deferred at this time, awaiting improvement of current medical problems.    The patient does have a current PCP Lemmie Evens, MD) and does not need an [facility] hospital follow-up appointment after discharge.  The patient does not have transportation limitations that hinder transportation to clinic appointments.  .Services Needed at time of discharge: Y = Yes, Blank = No PT:   OT:   RN:  Equipment:   Other:     LOS: 9 days   Riccardo Dubin, MD 10/26/2014, 11:36 AM

## 2014-10-26 NOTE — Progress Notes (Addendum)
Nutrition Follow-up  DOCUMENTATION CODES:  Not applicable  INTERVENTION:  Ensure Enlive (each supplement provides 350kcal and 20 grams of protein)  NUTRITION DIAGNOSIS:  Inadequate oral intake related to other (see comment) (poor appetite) as evidenced by other (see comment) (husband report).  Ongoing.  GOAL:  Patient will meet greater than or equal to 90% of their needs  Progressing.  MONITOR:  PO intake, Supplement acceptance, I & O's, Labs, Weight trends  REASON FOR ASSESSMENT:  Malnutrition Screening Tool    ASSESSMENT:  69 y.o. Female with symptomatic anemia, type 2 diabetes, NICM with EF 15-20% with ICD placement, PAF presented from her cardiologist's office with hypotension. She had been feeling dizzy for the past 3 weeks, and family mentions that she would transiently fall asleep while sitting, during conversations.   S/P small bowel capsule study on 5/4, no source of blood loss was identified.  Patient now on a heart healthy, CHO-modified diet, consuming 25-75% of meals and drinking Ensure Enlive supplements BID.  Height:  Ht Readings from Last 1 Encounters:  10/19/14 5\' 5"  (1.651 m)    Weight:  Wt Readings from Last 1 Encounters:  10/26/14 153 lb 14.4 oz (69.809 kg)    Ideal Body Weight:  56.8 kg  Wt Readings from Last 10 Encounters:  10/26/14 153 lb 14.4 oz (69.809 kg)  04/18/14 149 lb (67.586 kg)  03/14/14 153 lb (69.4 kg)  02/08/14 158 lb (71.668 kg)  01/10/14 158 lb 4.6 oz (71.8 kg)  10/11/13 155 lb (70.308 kg)  09/30/13 161 lb 1.6 oz (73.074 kg)  06/30/13 153 lb (69.4 kg)  06/29/13 153 lb 4 oz (69.514 kg)  06/22/13 157 lb 13.6 oz (71.6 kg)    BMI:  Body mass index is 25.61 kg/(m^2).  Estimated Nutritional Needs:  Kcal:  0762-2633  Protein:  80-90 gm  Fluid:  1.7-1.9 L  Skin:  Reviewed, no issues  Diet Order:  Diet heart healthy/carb modified Room service appropriate?: Yes; Fluid consistency:: Thin  EDUCATION NEEDS:  No  education needs identified at this time   Intake/Output Summary (Last 24 hours) at 10/26/14 1037 Last data filed at 10/26/14 0814  Gross per 24 hour  Intake    120 ml  Output      0 ml  Net    120 ml    Last BM:  5/4   Molli Barrows, RD, LDN, [place] Pager [phone] After Hours Pager [phone]

## 2014-10-26 NOTE — Consult Note (Signed)
ANTICOAGULATION CONSULT NOTE - Initial Consult  Pharmacy Consult for warfarin Indication: atrial fibrillation  No Known Allergies  Patient Measurements: Height: 5\' 5"  (165.1 cm) Weight: 153 lb 14.4 oz (69.809 kg) IBW/kg (Calculated) : 57  Vital Signs: Temp: 98 F (36.7 C) (05/05 0500) Temp Source: Oral (05/05 0500) BP: 128/68 mmHg (05/05 0500) Pulse Rate: 74 (05/05 0500)  Labs:  Recent Labs  10/24/14 0420 10/25/14 0450 10/26/14 0456  HGB 9.3* 9.3* 9.4*  HCT 28.1* 28.2* 28.7*  PLT 204 237 222  LABPROT 16.7* 16.9* 15.2  INR 1.34 1.36 1.19  CREATININE 1.61* 1.81* 1.94*    Estimated Creatinine Clearance: 26.5 mL/min (by C-G formula based on Cr of 1.94).   Medical History: Past Medical History  Diagnosis Date  . Cardiomyopathy, nonischemic     a. 1999 nl cath;  b. 12/05 [employer] [facility];  c. 10/2005 ICD extraction 2/2 enterococcus bacteremia and Veg on RV lead;  c. 05/2008 low risk Myoview (scarring w/ some evidence of inf ischemia);  d. 11/2012 Echo: EF 15-20%;  e. 01/2013 s/p MDT [place] CRT D, ser # [id number];  f. 05/2013 Echo: EF 15%.  . Enterococcal infection     a. 10/2005 - AICD-explanted  . Pulmonary embolism     a. 07/2005 after total right hip arthroplasty  . Hilar density     a. infrahilar mass/adenopathy on CT scan 5/07; subsequently  resolved  . LBBB (left bundle branch block)   . GERD (gastroesophageal reflux disease)   . Hyperlipidemia   . Hypertension   . Tobacco abuse     a. discontinued in 1997, and then resumed  . Urinary incontinence   . Anemia     a. mild/chronic  . Villous adenoma of colon     a. tubovillous adenomatous polyp with focal high grade dysplasia; presented with hematochezia - followed by Dr. Laural Golden.  . Implantable cardioverter-defibrillator-CRT- Mdt     a.  01/2013 s/p MDT [place] CRT D, ser # [id number]  . Chronic systolic CHF (congestive heart failure)     a. 11/2012 Echo: EF 15-20%;  b. 05/2013 TEE EF 15%.  . Pneumonia 07/2005  .  Obstructive sleep apnea     a. mild-did not tolerate CPAP (01/24/2013)  . Type II diabetes mellitus   . History of blood transfusion   . Degenerative joint disease     of knees, shoulder, and hips  . PAF (paroxysmal atrial fibrillation)     a. 07/2012 s/p TEE/DCCV;  b. chronic coumadin;  c. 05/2013 Recurrent Afib->TEE/DCCV and amio initiation.  . CKD (chronic kidney disease), stage III     creatinin-1.44 in 1/09; 1.51 in 1/10    Medications:  Scheduled:  . amiodarone  200 mg Oral Daily  . carvedilol  9.375 mg Oral BID WC  . darbepoetin (ARANESP) injection - NON-DIALYSIS  40 mcg Subcutaneous Q Fri-1800  . digoxin  0.0625 mg Oral QODAY  . docusate sodium  100 mg Oral Daily  . feeding supplement (ENSURE ENLIVE)  237 mL Oral BID BM  . furosemide  60 mg Oral Daily  . hydrALAZINE  12.5 mg Oral TID  . insulin aspart  0-15 Units Subcutaneous TID WC  . insulin aspart  0-5 Units Subcutaneous QHS  . insulin detemir  20 Units Subcutaneous QHS  . isosorbide mononitrate  30 mg Oral Daily  . levothyroxine  25 mcg Oral QAC breakfast  . pantoprazole (PROTONIX) IV  40 mg Intravenous Q24H  . polyethylene glycol  17  g Oral Daily  . pravastatin  80 mg Oral QHS    Assessment: 68yoF with nonischemic CMP/chronic systolic CHF, paroxysmal atrial fibrillation, CKD, and history of anemia presented on 10/17/2014 with profound anemia, weakness and hypotension. Warfarin had been held for GI work up which is now complete and negative for any acute bleeding. H/H stable at 9.4 s/p Aranesp x 1. Pharmacy has been consulted to restart Coumadin with low INR goal and without heparin bridge. INR on admission was SUPRAtherapeutic at 3.4 and has now trended down to 1.19 (2.5 mg given on 4/29, otherwise has been held). Of note, patient also on amiodarone.   Goal of Therapy:  INR goal 2-2.5  Monitor platelets by anticoagulation protocol: Yes   Plan:  Warfarin 1.25mg  x 1 tonight d/t SUPRAtherapetic INR on admission w/ 2.5mg   daily and now lower INR goal Daily INR/PT Monitor s/sx of bleeding Close follow up recommended outpatient  Recommend 1.25mg  daily until outpatient f/u can be done  Thank you for allowing pharmacy to be part of this patient's care team  Wynelle Dreier M. Levan Aloia, Pharm.D Clinical Pharmacy Resident Pager: [phone] 10/26/2014 .10:45 AM

## 2014-10-26 NOTE — Progress Notes (Signed)
Physical Therapy Treatment Patient Details Name: [name]: [id number] DOB: Mar 13, 1946 Today's Date: 10/26/2014    History of Present Illness Patient is a 69 year old with a history of symptomatic anemia, chronic kidney disease, type 2 diabetes, nonischemic cardiomyopathy status post ICD placement, paroxysmal atrial fibrillation who presents with symptomatic anemia and hypotension    PT Comments    Patient able to negotiate stairs this session with assist due to railings too wide.  Feel safe for d/c home with intermittent spouse assist and HHPT.  Follow Up Recommendations  Home health PT;Supervision - Intermittent     Equipment Recommendations  None recommended by PT    Recommendations for Other Services       Precautions / Restrictions Precautions Precautions: Fall    Mobility  Bed Mobility Overal bed mobility: Needs Assistance Bed Mobility: Sit to Supine;Supine to Sit     Supine to sit: Supervision Sit to supine: Min assist   General bed mobility comments: assist to get positioned in bed  Transfers Overall transfer level: Needs assistance Equipment used: Rolling walker (2 wheeled)   Sit to Stand: Min assist;From elevated surface         General transfer comment: assist due to difficulty with anterior weight shift with due to left hip AROM limitations  Ambulation/Gait Ambulation/Gait assistance: Supervision Ambulation Distance (Feet): 200 Feet Assistive device: 4-wheeled walker Gait Pattern/deviations: Step-through pattern;Antalgic     General Gait Details: walking on outside of right foot due to pain right great toe, leg length discrepany    Stairs Stairs: Yes Stairs assistance: Min assist Stair Management: One rail Right;Step to pattern;Forwards Number of Stairs: 5 General stair comments: with hand held assist on left due to rails too far apart.  Educated needs spouse to stand behind when negotiating stairs for Education officer, community     Modified Rankin (Stroke Patients Only)       Balance Overall balance assessment: Needs assistance       Postural control: Posterior lean Standing balance support: No upper extremity supported Standing balance-Leahy Scale: Poor Standing balance comment: initially wiht posterior lean needing minguard for balance, supervision only when holding walker                    Cognition Arousal/Alertness: Awake/alert Behavior During Therapy: WFL for tasks assessed/performed Overall Cognitive Status: Within Functional Limits for tasks assessed                      Exercises      General Comments General comments (skin integrity, edema, etc.): patient concerned about cost of HHPT, RNcase manager to discuss insurance coverage with patient      Pertinent Vitals/Pain Pain Score: 8  Pain Location: right great toe Pain Intervention(s): Monitored during session;Patient requesting pain meds-RN notified    Home Living                      Prior Function            PT Goals (current goals can now be found in the care plan section) Progress towards PT goals: Progressing toward goals    Frequency  Min 3X/week    PT Plan Current plan remains appropriate    Co-evaluation             End of Session Equipment Utilized During Treatment: Gait belt Activity Tolerance: Patient tolerated treatment well Patient left: in bed;with call bell/phone within reach  Time: 0383-3383 PT Time Calculation (min) (ACUTE ONLY): 24 min  Charges:  $Gait Training: 23-37 mins                    G Codes:      WYNN,CYNDI 2014/11/04, 11:08 AM  Magda Kiel, PT [phone] Nov 04, 2014

## 2014-10-26 NOTE — Progress Notes (Deleted)
UR Completed Quetzal Meany Graves-Bigelow, RN,BSN [phone]  

## 2014-10-26 NOTE — Progress Notes (Signed)
Patient ID: [name], female   DOB: 1945-07-28, 69 y.o.   MRN: [id number] Hematology: No obvious site of bleeding on capsule endo.   Hb stable since last transfusion. Could cautiously resume coumadin with target INR 2-2.5. Continue aranesp 300 micrograms Q 3 weeks post discharge or per Nephrology routine  Iron replacement

## 2014-10-31 ENCOUNTER — Other Ambulatory Visit (HOSPITAL_COMMUNITY): Payer: Self-pay | Admitting: *Deleted

## 2014-10-31 MED ORDER — CARVEDILOL 3.125 MG PO TABS: 9.3750 mg | ORAL_TABLET | Freq: Two times a day (BID) | ORAL | Status: DC

## 2014-11-02 ENCOUNTER — Other Ambulatory Visit (HOSPITAL_COMMUNITY): Payer: Self-pay | Admitting: Cardiology

## 2014-11-02 ENCOUNTER — Ambulatory Visit (HOSPITAL_COMMUNITY)
Admission: RE | Admit: 2014-11-02 | Discharge: 2014-11-02 | Disposition: A | Discharge status: Home / Self Care | Payer: Commercial Managed Care - HMO | Source: Ambulatory Visit | Attending: Internal Medicine | Admitting: Internal Medicine

## 2014-11-02 ENCOUNTER — Encounter (HOSPITAL_COMMUNITY): Payer: Self-pay

## 2014-11-02 VITALS — BP 96/60 | HR 92 | Wt 148.8 lb

## 2014-11-02 DIAGNOSIS — I480 Paroxysmal atrial fibrillation: Secondary | ICD-10-CM | POA: Insufficient documentation

## 2014-11-02 DIAGNOSIS — Z86711 Personal history of pulmonary embolism: Secondary | ICD-10-CM | POA: Diagnosis not present

## 2014-11-02 DIAGNOSIS — K625 Hemorrhage of anus and rectum: Secondary | ICD-10-CM | POA: Diagnosis not present

## 2014-11-02 DIAGNOSIS — E785 Hyperlipidemia, unspecified: Secondary | ICD-10-CM | POA: Diagnosis not present

## 2014-11-02 DIAGNOSIS — I5022 Chronic systolic (congestive) heart failure: Secondary | ICD-10-CM | POA: Insufficient documentation

## 2014-11-02 DIAGNOSIS — I429 Cardiomyopathy, unspecified: Secondary | ICD-10-CM | POA: Insufficient documentation

## 2014-11-02 DIAGNOSIS — N189 Chronic kidney disease, unspecified: Secondary | ICD-10-CM | POA: Diagnosis not present

## 2014-11-02 DIAGNOSIS — Z87891 Personal history of nicotine dependence: Secondary | ICD-10-CM | POA: Diagnosis not present

## 2014-11-02 DIAGNOSIS — Z79899 Other long term (current) drug therapy: Secondary | ICD-10-CM | POA: Diagnosis not present

## 2014-11-02 DIAGNOSIS — K219 Gastro-esophageal reflux disease without esophagitis: Secondary | ICD-10-CM | POA: Diagnosis not present

## 2014-11-02 DIAGNOSIS — Z8249 Family history of ischemic heart disease and other diseases of the circulatory system: Secondary | ICD-10-CM | POA: Insufficient documentation

## 2014-11-02 DIAGNOSIS — D649 Anemia, unspecified: Secondary | ICD-10-CM | POA: Insufficient documentation

## 2014-11-02 DIAGNOSIS — I129 Hypertensive chronic kidney disease with stage 1 through stage 4 chronic kidney disease, or unspecified chronic kidney disease: Secondary | ICD-10-CM | POA: Diagnosis not present

## 2014-11-02 DIAGNOSIS — Z95810 Presence of automatic (implantable) cardiac defibrillator: Secondary | ICD-10-CM | POA: Insufficient documentation

## 2014-11-02 LAB — CBC
HCT: 31.1 % — ABNORMAL LOW (ref 36.0–46.0)
HEMOGLOBIN: 10.2 g/dL — AB (ref 12.0–15.0)
MCH: 30.2 pg (ref 26.0–34.0)
MCHC: 32.8 g/dL (ref 30.0–36.0)
MCV: 92 fL (ref 78.0–100.0)
PLATELETS: 144 10*3/uL — AB (ref 150–400)
RBC: 3.38 MIL/uL — AB (ref 3.87–5.11)
RDW: 15.4 % (ref 11.5–15.5)
WBC: 3.2 10*3/uL — ABNORMAL LOW (ref 4.0–10.5)

## 2014-11-02 LAB — BASIC METABOLIC PANEL
Anion gap: 10 (ref 5–15)
BUN: 39 mg/dL — ABNORMAL HIGH (ref 6–20)
CO2: 28 mmol/L (ref 22–32)
Calcium: 9.3 mg/dL (ref 8.9–10.3)
Chloride: 98 mmol/L — ABNORMAL LOW (ref 101–111)
Creatinine, Ser: 2.32 mg/dL — ABNORMAL HIGH (ref 0.44–1.00)
GFR calc Af Amer: 23 mL/min — ABNORMAL LOW (ref >60)
GFR, EST NON AFRICAN AMERICAN: 20 mL/min — AB (ref >60)
GLUCOSE: 267 mg/dL — AB (ref 65–99)
POTASSIUM: 3.9 mmol/L (ref 3.5–5.1)
SODIUM: 136 mmol/L (ref 135–145)

## 2014-11-02 NOTE — Addendum Note (Signed)
Encounter addended by: Scarlette Calico, RN on: 11/02/2014 11:24 AM<BR>     Documentation filed: Patient Instructions Section

## 2014-11-02 NOTE — Patient Instructions (Signed)
Your physician recommends that you schedule a follow-up appointment in: 1 months

## 2014-11-02 NOTE — Progress Notes (Signed)
Patient ID: [name], female   DOB: [birth year], 69 y.o.   MRN: [id number] PCP: Dr. Karie Kirks Cardiology: Dr. Lovena Le  69 y.o. with history of chronic systolic CHF from nonischemic cardiomyopathy, prior PE, CKD, and paroxysmal atrial fibrillation presents for CHF clinic evaluation.  She has a long history of cardiomyopathy, dating back to 1999.  At that time, she had coronary angiography showing no significant disease. She was initially followed by Dr Lattie Haw, later by Dr. Lovena Le. She had her initial CRT-D device in 2005.  This was removed in 2007 due to enterococcal bacteremia with vegetation.  She had Medtronic CRT- D device placed in 8/14.  She has had trouble with paroxysmal atrial fibrillation, most recently she was put on amiodarone and cardioverted in 12/14.  TEE in 12/14 showed EF 15% but RV appeared normal.   Had RHC on 04/18/14 for possible low output. This showed volume depletion with normal cardiac output. Lasix held for a few days then cut back  From 80 bid to 40 bid. Lisinopril also help but has been restarted.   She returns for post hospital follow up. Last week admitted with profound anemia (hgb 6.4), weakness, and hypotension. She has received a total of 5 units PRBCs. Yesterday had BRBPR. Denies SOB/PND/Orthpnea. Weight at home 146 pounds.   Labs (7/15): K 3.5, creatinine 1.78, hemoglobin 9.4,m BNP 24607 Labs (10/15): K 4.7, creatinine 2.3, digoxin 0.7, BNP 8376, HCT 30.9 Labs (04/18/14): K 4.3 creatinine 2.8  Labs (11/15): K 5.3, creatinine 1.9 Labs (10/26/2014) K 3.7 Creatinine 1.94 Hgb 9.3   Optivol shows fluid index above threshold with impedance trending down. Not very active. No AF/VT. 100% CRT  PMH:  1. Chronic systolic CHF: Nonischemic cardiomyopathy.  1999 had LHC with normal coronaries.  12/05 had Guidant BiV ICD placed. 5/07 had enterococcal bacteremia with ICD lead vegetation, device extracted.  8/14 had placement of Medtronic BiV ICD device.  Cardiolite (12/09) with  multiple wall segments showing scarring, some inferior ischemia.  Echo (6/14) with EF 15-20%, diffuse hypokinesis with some regionality, normal RV size and systolic function.  TEE (12/14) with EF 15%, normal RV size and systolic function. RHC (10/15) with mean RA 1, PA 27/8, mean PCWP 5, CI 2.71. 2. PE: 2/07 after right THR.  3. Right THR 4. LBBB 5. GERD 6. Hyperlipidemia 7. HTN 8. Anemia: 12/14 had EGD that was normal, colonoscopy with 3 polyps removed.  9. CKD 10. OSA: Mild, did not tolerate OSA 11. Atrial fibrillation: Paroxysmal.  TEE-guided DCCV in 2/14, TEE-guided DCCV in 12/14 on amiodarone.    SH: Married, lives in [place], previous smoker.   FH: Mother with MI.   ROS: All systems reviewed and negative except as per HPI.   Current Outpatient Prescriptions  Medication Sig Dispense Refill  . amiodarone (PACERONE) 200 MG tablet Take 1 tablet (200 mg total) by mouth daily. 90 tablet 3  . carvedilol (COREG) 3.125 MG tablet Take 3 tablets (9.375 mg total) by mouth 2 (two) times daily with a meal. 180 tablet 2  . cetirizine (ZYRTEC) 10 MG tablet Take 10 mg by mouth daily.    . colchicine 0.6 MG tablet Take 0.6 mg by mouth 2 (two) times daily.     . digoxin (LANOXIN) 0.125 MG tablet Take 0.5 tablets (0.0625 mg total) by mouth every other day. 30 tablet 3  . feeding supplement, ENSURE ENLIVE, (ENSURE ENLIVE) LIQD Take 237 mLs by mouth 2 (two) times daily between meals. 237 mL 12  .  furosemide (LASIX) 40 MG tablet Take 1.5 tablets (60 mg total) by mouth 2 (two) times daily. 90 tablet 3  . isosorbide mononitrate (IMDUR) 30 MG 24 hr tablet Take 1 tablet (30 mg total) by mouth daily. 30 tablet 0  . ivabradine (CORLANOR) 5 MG TABS tablet Take 0.5 tablets (2.5 mg total) by mouth 2 (two) times daily with a meal. 60 tablet 3  . levothyroxine (SYNTHROID, LEVOTHROID) 25 MCG tablet Take 1 tablet (25 mcg total) by mouth daily before breakfast. 30 tablet 0  . metoCLOPramide (REGLAN) 5 MG tablet  Take 5 mg by mouth 4 (four) times daily.    Marland Kitchen oxyCODONE-acetaminophen (PERCOCET) 10-325 MG per tablet Take 1 tablet by mouth every 6 (six) hours as needed for pain.     . pravastatin (PRAVACHOL) 40 MG tablet Take 80 mg by mouth at bedtime.     Marland Kitchen warfarin (COUMADIN) 2.5 MG tablet Take 0.5 tablets (1.25 mg total) by mouth daily at 6 PM. 10 tablet 0   No current facility-administered medications for this encounter.    BP 96/60 mmHg  Pulse 92  Wt 148 lb 12.8 oz (67.495 kg)  SpO2 95% General: NAD Neck: JVP 8 cm, no thyromegaly or thyroid nodule.  Lungs: Clear to auscultation bilaterally with normal respiratory effort. CV: Nondisplaced PMI.  Heart regular S1/S2, no S3/S4, 1/6 early SEM RUSB.  No peripheral edema.  No carotid bruit.  Normal pedal pulses.  Abdomen: Soft, nontender, no hepatosplenomegaly, no distention.  Skin: Intact without lesions or rashes.  Neurologic: Alert and oriented x 3.  Psych: Normal affect. Extremities: No clubbing or cyanosis. Walks bent over with marked limp on R hip  HEENT: Normal.   Assessment/Plan:  69 yo with nonischemic CMP/chronic systolic CHF, paroxysmal atrial fibrillation, CKD, and history of anemia presented with profound anemia, weakness and hypotension.  1. Chronic systolic CHF: Patient has a nonischemic cardiomyopathy. Echo EF 15-20% Grade II DD. NYHA class III symptoms. RHC (10/15) showed low filling pressures and normal cardiac output. She is unable to do CPX testing and would not be LVAD candidate.   Volume status stable. She does not look volume overloaded. BP stable. - Continue current hydralazine/Imdur - Continue coreg to 9.375 mg bid.  - No Ace or spiro with CKD.  - Digoxin every other day with level 1.1. - Continue Lasix 60 mg daily.  2. Atrial fibrillation: Paroxysmal - Continue amiodarone for now. TSH high, Levoxyl begun. - She has been on coumadin. No definite GI bleeding source noted. Will have her restart coumadin with  goal INR 2-2.5. Followed by PCP.  3.  CKD: Keep off lisinopril and use hydralazine/Imdur instead.  4. Anemia: She had EGD and c-scope in 12/14. In 12/15, she had transfusion but apparently no-showed for her GI workup. Seen by hematology this admission, suspect normocytic anemia is most likely due to combination of CKD and hypothyroidism, less likely MDS. Erythropoeitin started. She has had 5 units PRBCs in May. Per GI -Iron deficiency anemia with blood in stool: few scattered lymphangiectasias noted on the small bowel capsule study but no source of blood loss identified. Small lesions could be missed as she had some debris in the small bowel. Continue aranesp injections every 3 weeks.    Check BMET/CBC now.   Follow up in 4 weeks   CLEGG,AMY NP-C  11/02/2014  Patient seen and examined with Darrick Grinder, NP. We discussed all aspects of the encounter. I agree with the assessment and plan as stated above.  Doing well from a HF perspective. Rectal bleeding likely seems to be hemorrhoidal. Will check labs today. BP too soft to titrate meds.   Bensimhon, Daniel,MD 11:14 AM

## 2014-11-07 ENCOUNTER — Ambulatory Visit (INDEPENDENT_AMBULATORY_CARE_PROVIDER_SITE_OTHER): Payer: Commercial Managed Care - HMO | Admitting: Urology

## 2014-11-07 DIAGNOSIS — R350 Frequency of micturition: Secondary | ICD-10-CM

## 2014-11-07 DIAGNOSIS — N3944 Nocturnal enuresis: Secondary | ICD-10-CM | POA: Diagnosis not present

## 2014-11-07 DIAGNOSIS — N3942 Incontinence without sensory awareness: Secondary | ICD-10-CM

## 2014-11-17 ENCOUNTER — Other Ambulatory Visit (HOSPITAL_COMMUNITY): Payer: Self-pay | Admitting: Cardiology

## 2014-11-19 ENCOUNTER — Other Ambulatory Visit (HOSPITAL_COMMUNITY): Payer: Self-pay | Admitting: Cardiology

## 2014-11-22 ENCOUNTER — Encounter (INDEPENDENT_AMBULATORY_CARE_PROVIDER_SITE_OTHER): Payer: Self-pay | Admitting: *Deleted

## 2014-11-30 ENCOUNTER — Encounter (HOSPITAL_COMMUNITY): Payer: Self-pay | Admitting: Emergency Medicine

## 2014-11-30 ENCOUNTER — Emergency Department (HOSPITAL_COMMUNITY): Payer: Commercial Managed Care - HMO

## 2014-11-30 ENCOUNTER — Inpatient Hospital Stay (HOSPITAL_COMMUNITY)
Admission: EM | Admit: 2014-11-30 | Discharge: 2014-12-02 | DRG: 811 | Disposition: A | Discharge status: Home / Self Care | Payer: Commercial Managed Care - HMO | Attending: Internal Medicine | Admitting: Internal Medicine

## 2014-11-30 ENCOUNTER — Inpatient Hospital Stay (HOSPITAL_COMMUNITY): Payer: Commercial Managed Care - HMO

## 2014-11-30 DIAGNOSIS — D638 Anemia in other chronic diseases classified elsewhere: Secondary | ICD-10-CM

## 2014-11-30 DIAGNOSIS — R0902 Hypoxemia: Secondary | ICD-10-CM

## 2014-11-30 DIAGNOSIS — Z7901 Long term (current) use of anticoagulants: Secondary | ICD-10-CM | POA: Diagnosis not present

## 2014-11-30 DIAGNOSIS — D649 Anemia, unspecified: Secondary | ICD-10-CM | POA: Diagnosis not present

## 2014-11-30 DIAGNOSIS — J449 Chronic obstructive pulmonary disease, unspecified: Secondary | ICD-10-CM | POA: Diagnosis present

## 2014-11-30 DIAGNOSIS — I4891 Unspecified atrial fibrillation: Secondary | ICD-10-CM | POA: Diagnosis present

## 2014-11-30 DIAGNOSIS — I130 Hypertensive heart and chronic kidney disease with heart failure and stage 1 through stage 4 chronic kidney disease, or unspecified chronic kidney disease: Secondary | ICD-10-CM | POA: Diagnosis present

## 2014-11-30 DIAGNOSIS — I5043 Acute on chronic combined systolic (congestive) and diastolic (congestive) heart failure: Secondary | ICD-10-CM | POA: Diagnosis present

## 2014-11-30 DIAGNOSIS — I5042 Chronic combined systolic (congestive) and diastolic (congestive) heart failure: Secondary | ICD-10-CM | POA: Diagnosis not present

## 2014-11-30 DIAGNOSIS — G4733 Obstructive sleep apnea (adult) (pediatric): Secondary | ICD-10-CM | POA: Diagnosis present

## 2014-11-30 DIAGNOSIS — R5383 Other fatigue: Secondary | ICD-10-CM | POA: Diagnosis present

## 2014-11-30 DIAGNOSIS — D500 Iron deficiency anemia secondary to blood loss (chronic): Principal | ICD-10-CM | POA: Diagnosis present

## 2014-11-30 DIAGNOSIS — I4510 Unspecified right bundle-branch block: Secondary | ICD-10-CM | POA: Diagnosis present

## 2014-11-30 DIAGNOSIS — I480 Paroxysmal atrial fibrillation: Secondary | ICD-10-CM | POA: Diagnosis present

## 2014-11-30 DIAGNOSIS — E785 Hyperlipidemia, unspecified: Secondary | ICD-10-CM | POA: Diagnosis present

## 2014-11-30 DIAGNOSIS — Z87891 Personal history of nicotine dependence: Secondary | ICD-10-CM | POA: Diagnosis not present

## 2014-11-30 DIAGNOSIS — J9601 Acute respiratory failure with hypoxia: Secondary | ICD-10-CM

## 2014-11-30 DIAGNOSIS — IMO0002 Reserved for concepts with insufficient information to code with codable children: Secondary | ICD-10-CM | POA: Diagnosis present

## 2014-11-30 DIAGNOSIS — Z96641 Presence of right artificial hip joint: Secondary | ICD-10-CM | POA: Diagnosis present

## 2014-11-30 DIAGNOSIS — F17201 Nicotine dependence, unspecified, in remission: Secondary | ICD-10-CM

## 2014-11-30 DIAGNOSIS — K219 Gastro-esophageal reflux disease without esophagitis: Secondary | ICD-10-CM | POA: Diagnosis present

## 2014-11-30 DIAGNOSIS — E039 Hypothyroidism, unspecified: Secondary | ICD-10-CM | POA: Diagnosis present

## 2014-11-30 DIAGNOSIS — I429 Cardiomyopathy, unspecified: Secondary | ICD-10-CM | POA: Diagnosis present

## 2014-11-30 DIAGNOSIS — D631 Anemia in chronic kidney disease: Secondary | ICD-10-CM

## 2014-11-30 DIAGNOSIS — E1165 Type 2 diabetes mellitus with hyperglycemia: Secondary | ICD-10-CM | POA: Diagnosis present

## 2014-11-30 DIAGNOSIS — N184 Chronic kidney disease, stage 4 (severe): Secondary | ICD-10-CM | POA: Diagnosis present

## 2014-11-30 DIAGNOSIS — Z79899 Other long term (current) drug therapy: Secondary | ICD-10-CM

## 2014-11-30 DIAGNOSIS — Z86711 Personal history of pulmonary embolism: Secondary | ICD-10-CM | POA: Diagnosis not present

## 2014-11-30 DIAGNOSIS — N189 Chronic kidney disease, unspecified: Secondary | ICD-10-CM

## 2014-11-30 DIAGNOSIS — I509 Heart failure, unspecified: Secondary | ICD-10-CM

## 2014-11-30 DIAGNOSIS — Z95810 Presence of automatic (implantable) cardiac defibrillator: Secondary | ICD-10-CM | POA: Diagnosis not present

## 2014-11-30 DIAGNOSIS — Z9111 Patient's noncompliance with dietary regimen: Secondary | ICD-10-CM | POA: Diagnosis present

## 2014-11-30 DIAGNOSIS — E1129 Type 2 diabetes mellitus with other diabetic kidney complication: Secondary | ICD-10-CM | POA: Diagnosis present

## 2014-11-30 DIAGNOSIS — R509 Fever, unspecified: Secondary | ICD-10-CM | POA: Diagnosis present

## 2014-11-30 DIAGNOSIS — E1122 Type 2 diabetes mellitus with diabetic chronic kidney disease: Secondary | ICD-10-CM | POA: Diagnosis present

## 2014-11-30 DIAGNOSIS — I10 Essential (primary) hypertension: Secondary | ICD-10-CM | POA: Diagnosis present

## 2014-11-30 HISTORY — DX: Anemia in chronic kidney disease: D63.1

## 2014-11-30 LAB — RAPID STREP SCREEN (MED CTR MEBANE ONLY): Streptococcus, Group A Screen (Direct): NEGATIVE

## 2014-11-30 LAB — URINALYSIS, ROUTINE W REFLEX MICROSCOPIC
Bilirubin Urine: NEGATIVE
Glucose, UA: 100 mg/dL — AB
Hgb urine dipstick: NEGATIVE
Ketones, ur: NEGATIVE mg/dL
LEUKOCYTES UA: NEGATIVE
Nitrite: NEGATIVE
PROTEIN: NEGATIVE mg/dL
SPECIFIC GRAVITY, URINE: 1.009 (ref 1.005–1.030)
Urobilinogen, UA: 1 mg/dL (ref 0.0–1.0)
pH: 6.5 (ref 5.0–8.0)

## 2014-11-30 LAB — PREPARE RBC (CROSSMATCH)

## 2014-11-30 LAB — PROTIME-INR
INR: 1.51 — ABNORMAL HIGH (ref 0.00–1.49)
PROTHROMBIN TIME: 18.2 s — AB (ref 11.6–15.2)

## 2014-11-30 LAB — GLUCOSE, CAPILLARY
GLUCOSE-CAPILLARY: 128 mg/dL — AB (ref 65–99)
Glucose-Capillary: 231 mg/dL — ABNORMAL HIGH (ref 65–99)
Glucose-Capillary: 118 mg/dL — ABNORMAL HIGH (ref 65–99)

## 2014-11-30 LAB — BASIC METABOLIC PANEL
ANION GAP: 13 (ref 5–15)
BUN: 33 mg/dL — AB (ref 6–20)
CHLORIDE: 98 mmol/L — AB (ref 101–111)
CO2: 22 mmol/L (ref 22–32)
Calcium: 8.5 mg/dL — ABNORMAL LOW (ref 8.9–10.3)
Creatinine, Ser: 2 mg/dL — ABNORMAL HIGH (ref 0.44–1.00)
GFR, EST AFRICAN AMERICAN: 28 mL/min — AB (ref >60)
GFR, EST NON AFRICAN AMERICAN: 24 mL/min — AB (ref >60)
GLUCOSE: 271 mg/dL — AB (ref 65–99)
Potassium: 3.7 mmol/L (ref 3.5–5.1)
SODIUM: 133 mmol/L — AB (ref 135–145)

## 2014-11-30 LAB — CBC
HCT: 22.6 % — ABNORMAL LOW (ref 36.0–46.0)
HCT: 27.3 % — ABNORMAL LOW (ref 36.0–46.0)
Hemoglobin: 7.6 g/dL — ABNORMAL LOW (ref 12.0–15.0)
Hemoglobin: 9.2 g/dL — ABNORMAL LOW (ref 12.0–15.0)
MCH: 30.9 pg (ref 26.0–34.0)
MCH: 30.9 pg (ref 26.0–34.0)
MCHC: 33.6 g/dL (ref 30.0–36.0)
MCHC: 33.7 g/dL (ref 30.0–36.0)
MCV: 91.9 fL (ref 78.0–100.0)
MCV: 91.6 fL (ref 78.0–100.0)
PLATELETS: 144 10*3/uL — AB (ref 150–400)
Platelets: 156 10*3/uL (ref 150–400)
RBC: 2.46 MIL/uL — AB (ref 3.87–5.11)
RBC: 2.98 MIL/uL — ABNORMAL LOW (ref 3.87–5.11)
RDW: 15.7 % — ABNORMAL HIGH (ref 11.5–15.5)
RDW: 15.5 % (ref 11.5–15.5)
WBC: 9.5 10*3/uL (ref 4.0–10.5)
WBC: 6.6 10*3/uL (ref 4.0–10.5)

## 2014-11-30 LAB — BRAIN NATRIURETIC PEPTIDE: B NATRIURETIC PEPTIDE 5: 1322.3 pg/mL — AB (ref 0.0–100.0)

## 2014-11-30 LAB — MRSA PCR SCREENING: MRSA BY PCR: POSITIVE — AB

## 2014-11-30 LAB — POC OCCULT BLOOD, ED: Fecal Occult Bld: NEGATIVE

## 2014-11-30 LAB — HEMOGLOBIN AND HEMATOCRIT, BLOOD
HEMATOCRIT: 24.7 % — AB (ref 36.0–46.0)
Hemoglobin: 8.2 g/dL — ABNORMAL LOW (ref 12.0–15.0)

## 2014-11-30 LAB — VITAMIN B12: VITAMIN B 12: 433 pg/mL (ref 180–914)

## 2014-11-30 LAB — I-STAT TROPONIN, ED: TROPONIN I, POC: 0.02 ng/mL (ref 0.00–0.08)

## 2014-11-30 MED ORDER — FUROSEMIDE 10 MG/ML IJ SOLN
40.0000 mg | Freq: Once | INTRAMUSCULAR | Status: AC
  Administered 2014-11-30: 40 mg via INTRAVENOUS
  Filled 2014-11-30: qty 4

## 2014-11-30 MED ORDER — AMIODARONE HCL 200 MG PO TABS
200.0000 mg | ORAL_TABLET | Freq: Every day | ORAL | Status: DC
  Administered 2014-11-30 – 2014-12-02 (×3): 200 mg via ORAL
  Filled 2014-11-30 (×3): qty 1

## 2014-11-30 MED ORDER — FUROSEMIDE 40 MG PO TABS
60.0000 mg | ORAL_TABLET | Freq: Two times a day (BID) | ORAL | Status: DC
  Administered 2014-12-01 – 2014-12-02 (×2): 60 mg via ORAL
  Filled 2014-11-30 (×6): qty 1

## 2014-11-30 MED ORDER — ALUM & MAG HYDROXIDE-SIMETH 200-200-20 MG/5ML PO SUSP: 30.0000 mL | Freq: Four times a day (QID) | ORAL | Status: DC | PRN

## 2014-11-30 MED ORDER — ONDANSETRON HCL 4 MG PO TABS
4.0000 mg | ORAL_TABLET | Freq: Four times a day (QID) | ORAL | Status: DC | PRN
Start: 2014-11-30 — End: 2014-12-02

## 2014-11-30 MED ORDER — POLYETHYLENE GLYCOL 3350 17 GM/SCOOP PO POWD
1.0000 | Freq: Every day | ORAL | Status: DC | PRN
  Filled 2014-11-30: qty 255

## 2014-11-30 MED ORDER — ENSURE ENLIVE PO LIQD
237.0000 mL | Freq: Two times a day (BID) | ORAL | Status: DC
  Administered 2014-12-01 (×2): 237 mL via ORAL

## 2014-11-30 MED ORDER — INSULIN ASPART 100 UNIT/ML ~~LOC~~ SOLN
0.0000 [IU] | Freq: Three times a day (TID) | SUBCUTANEOUS | Status: DC
  Administered 2014-11-30: 5 [IU] via SUBCUTANEOUS
  Administered 2014-11-30 – 2014-12-01 (×4): 2 [IU] via SUBCUTANEOUS
  Administered 2014-12-02: 3 [IU] via SUBCUTANEOUS

## 2014-11-30 MED ORDER — DOXYCYCLINE HYCLATE 100 MG PO TABS
100.0000 mg | ORAL_TABLET | Freq: Two times a day (BID) | ORAL | Status: DC
  Administered 2014-11-30 – 2014-12-01 (×3): 100 mg via ORAL
  Filled 2014-11-30 (×5): qty 1

## 2014-11-30 MED ORDER — DIGOXIN 0.0625 MG HALF TABLET
0.0625 mg | ORAL_TABLET | ORAL | Status: DC
  Administered 2014-11-30 – 2014-12-02 (×2): 0.0625 mg via ORAL
  Filled 2014-11-30 (×2): qty 1

## 2014-11-30 MED ORDER — ONDANSETRON HCL 4 MG/2ML IJ SOLN: 4.0000 mg | Freq: Four times a day (QID) | INTRAMUSCULAR | Status: DC | PRN

## 2014-11-30 MED ORDER — ISOSORBIDE MONONITRATE ER 30 MG PO TB24
30.0000 mg | ORAL_TABLET | Freq: Every day | ORAL | Status: DC
  Administered 2014-11-30 – 2014-12-02 (×3): 30 mg via ORAL
  Filled 2014-11-30 (×3): qty 1

## 2014-11-30 MED ORDER — SODIUM CHLORIDE 0.9 % IV SOLN
Freq: Once | INTRAVENOUS | Status: AC
  Administered 2014-11-30: 13:00:00 via INTRAVENOUS

## 2014-11-30 MED ORDER — ACETAMINOPHEN 650 MG RE SUPP: 650.0000 mg | Freq: Four times a day (QID) | RECTAL | Status: DC | PRN

## 2014-11-30 MED ORDER — THIAMINE HCL 100 MG/ML IJ SOLN
100.0000 mg | Freq: Every day | INTRAMUSCULAR | Status: DC
  Administered 2014-11-30 – 2014-12-01 (×2): 100 mg via INTRAVENOUS
  Filled 2014-11-30 (×2): qty 1

## 2014-11-30 MED ORDER — LEVOTHYROXINE SODIUM 25 MCG PO TABS
25.0000 ug | ORAL_TABLET | Freq: Every day | ORAL | Status: DC
  Administered 2014-12-01 – 2014-12-02 (×2): 25 ug via ORAL
  Filled 2014-11-30 (×3): qty 1

## 2014-11-30 MED ORDER — INSULIN ASPART 100 UNIT/ML ~~LOC~~ SOLN: 0.0000 [IU] | Freq: Every day | SUBCUTANEOUS | Status: DC

## 2014-11-30 MED ORDER — ACETAMINOPHEN 325 MG PO TABS
650.0000 mg | ORAL_TABLET | Freq: Four times a day (QID) | ORAL | Status: DC | PRN
  Administered 2014-12-01: 650 mg via ORAL
  Filled 2014-11-30: qty 2

## 2014-11-30 MED ORDER — CARVEDILOL 6.25 MG PO TABS
9.3750 mg | ORAL_TABLET | Freq: Two times a day (BID) | ORAL | Status: DC
  Administered 2014-11-30 – 2014-12-02 (×4): 9.375 mg via ORAL
  Filled 2014-11-30 (×6): qty 1

## 2014-11-30 MED ORDER — LORATADINE 10 MG PO TABS
10.0000 mg | ORAL_TABLET | Freq: Every day | ORAL | Status: DC
  Administered 2014-11-30 – 2014-12-02 (×3): 10 mg via ORAL
  Filled 2014-11-30 (×3): qty 1

## 2014-11-30 MED ORDER — MUPIROCIN 2 % EX OINT
1.0000 "application " | TOPICAL_OINTMENT | Freq: Two times a day (BID) | CUTANEOUS | Status: DC
  Administered 2014-11-30 – 2014-12-02 (×5): 1 via NASAL
  Filled 2014-11-30: qty 22

## 2014-11-30 MED ORDER — CEFTRIAXONE SODIUM IN DEXTROSE 20 MG/ML IV SOLN
1.0000 g | INTRAVENOUS | Status: DC
Start: 2014-11-30 — End: 2014-12-01
  Administered 2014-11-30: 1 g via INTRAVENOUS
  Filled 2014-11-30 (×2): qty 50

## 2014-11-30 MED ORDER — FOLIC ACID 5 MG/ML IJ SOLN
1.0000 mg | Freq: Every day | INTRAMUSCULAR | Status: DC
  Administered 2014-11-30: 1 mg via INTRAVENOUS
  Filled 2014-11-30 (×2): qty 0.2

## 2014-11-30 MED ORDER — CHLORHEXIDINE GLUCONATE CLOTH 2 % EX PADS
6.0000 | MEDICATED_PAD | Freq: Every day | CUTANEOUS | Status: DC
  Administered 2014-12-01: 6 via TOPICAL

## 2014-11-30 NOTE — Progress Notes (Signed)
Repeat H/H hgb was 8.2 so will tx 1 unit PRBCs with Lasix 40 mg IV post tx along with CBC.  Erin Hearing, ANP

## 2014-11-30 NOTE — H&P (Deleted)
Patient was seen, examined, treatment plan was discussed with the Physician extender. I have directly reviewed the clinical findings, lab, imaging studies and management of this patient in detail. I have made the necessary changes to the above noted documentation, and agree with the documentation, as recorded by the Physician extender.  69 year old female with past medical history significant for chronic systolic and diastolic congestive heart failure, last 2-D echo in April 2016 with ejection fraction of 15-20% and grade 2 diastolic dysfunction, status post ICD, chronic kidney disease stage IV, atrial fibrillation on anticoagulation with Coumadin, history of pulmonary embolism, diabetes. She also has chronic anemia and has had workup back in December 2015 and per hematology at that time the thought was anemia is secondary to combination of iron deficiency and anemia of chronic kidney disease. Patient underwent EGD in April 2016 and colonoscopy in May 2016 which was essentially unremarkable except for benign polyp identified on colonoscopy. She also had a capsule endoscopy back in May 2016 which revealed scattered angiectasias. Patient presented to [facility] with worsening shortness of breath and fatigue over past couple of days prior to this admission. She reported no associated fevers or chills. No associated chest pain. No lightheadedness or loss of consciousness. No reports of blood in the stool or urine. In ED, she was hemodynamically stable. Her blood work was significant for hemoglobin of 7.6, creatinine of 2, INR of 1.51. Chest x-ray showed findings consistent with mild congestive heart failure and cardiac pacer in stable position. She was given one dose of Lasix 40 mg IV in ED. Blood transfusion ordered in ED but not yet initiated. Cardiology and nephrology were consulted.  Assessment and plan:  Principal problem: Symptomatic anemia / anemia of chronic disease / iron deficiency anemia -  As mentioned above, anemia likely secondary to combination of findings deficiency and chronic kidney disease - Hemoglobin is 7.6 on this admission. - Hold Coumadin for now. - Transfuse 1 unit of PRBC - If hemoglobin remains stable then will resume Coumadin tomorrow - No reports of active bleed.  *Please refer to Physician extender note for details on assessment and plan below.   [name], MD [facility] [phone]   [facility] History and Physical                                                                                    [name], is a 69 y.o. female  MRN: [id number]   DOB - 1946/05/18  Admit Date - 11/30/2014  Outpatient Primary MD for the patient is Robert Bellow, MD  Referring MD: Maryan Rued / ER  Consulting M.D: Mercy Moore / Nephrology  With History of -  Past Medical History  Diagnosis Date  . Cardiomyopathy, nonischemic     a. 1999 nl cath;  b. 12/05 [employer] [place];  c. 10/2005 ICD extraction 2/2 enterococcus bacteremia and Veg on RV lead;  c. 05/2008 low risk Myoview (scarring w/ some evidence of inf ischemia);  d. 11/2012 Echo: EF 15-20%;  e. 01/2013 s/p MDT [place] CRT D, ser # [id number];  f. 05/2013 Echo: EF 15%.  . Enterococcal infection     a. 10/2005 - AICD-explanted  .  Pulmonary embolism     a. 07/2005 after total right hip arthroplasty  . Hilar density     a. infrahilar mass/adenopathy on CT scan 5/07; subsequently  resolved  . LBBB (left bundle branch block)   . GERD (gastroesophageal reflux disease)   . Hyperlipidemia   . Hypertension   . Tobacco abuse     a. discontinued in 1997, and then resumed  . Urinary incontinence   . Anemia     a. mild/chronic  . Villous adenoma of colon     a. tubovillous adenomatous polyp with focal high grade dysplasia; presented with hematochezia - followed by Dr. Laural Golden.  . Implantable cardioverter-defibrillator-CRT- Mdt     a.  01/2013 s/p MDT [place] CRT D, ser # [id number]  . Chronic systolic CHF  (congestive heart failure)     a. 11/2012 Echo: EF 15-20%;  b. 05/2013 TEE EF 15%.  . Pneumonia 07/2005  . Obstructive sleep apnea     a. mild-did not tolerate CPAP (01/24/2013)  . Type II diabetes mellitus   . History of blood transfusion   . Degenerative joint disease     of knees, shoulder, and hips  . PAF (paroxysmal atrial fibrillation)     a. 07/2012 s/p TEE/DCCV;  b. chronic coumadin;  c. 05/2013 Recurrent Afib->TEE/DCCV and amio initiation.  . CKD (chronic kidney disease), stage III     creatinin-1.44 in 1/09; 1.51 in 1/10      Past Surgical History  Procedure Laterality Date  . Pacemaker removal  11/18/05    Enterococcal infection  . Total hip arthroplasty Right 07/2005  . Knee arthroscopy Right 1980's?  . Colonoscopy w/ polypectomy  2009  . Cardioversion N/A 08/20/2012    Procedure: TEE GUIDED CARDIOVERSION;  Surgeon: Yehuda Savannah, MD;  Location: [facility];  Service: Cardiovascular;  Laterality: N/A;  To be done @ bedside  . Tee without cardioversion N/A 08/20/2012    Procedure: TRANSESOPHAGEAL ECHOCARDIOGRAM (TEE);  Surgeon: Yehuda Savannah, MD;  Location: [facility];  Service: Cardiovascular;  Laterality: N/A;  . Bi-ventricular implantable cardioverter defibrillator  (crt-d)  01/24/2013  . A-v cardiac pacemaker insertion  12/05    Biventricular pacemaker/AICD  . Abdominal hysterectomy  1990/92    Initial partial hysterectomy followed by BSO  . Tubal ligation  1980's  . Cardiac catheterization    . Colonoscopy with esophagogastroduodenoscopy (egd) N/A 06/10/2013    Procedure: COLONOSCOPY WITH ESOPHAGOGASTRODUODENOSCOPY (EGD);  Surgeon: Rogene Houston, MD;  Location: [facility];  Service: Endoscopy;  Laterality: N/A;  925  . Tee without cardioversion N/A 06/20/2013    Procedure: TRANSESOPHAGEAL ECHOCARDIOGRAM (TEE);  Surgeon: Dorothy Spark, MD;  Location: [facility];  Service: Cardiovascular;  Laterality: N/A;  . Cardioversion N/A 06/20/2013    Procedure: CARDIOVERSION;   Surgeon: Dorothy Spark, MD;  Location: [facility] ENDOSCOPY;  Service: Cardiovascular;  Laterality: N/A;  . Bi-ventricular implantable cardioverter defibrillator N/A 01/24/2013    Procedure: BI-VENTRICULAR IMPLANTABLE CARDIOVERTER DEFIBRILLATOR  (CRT-D);  Surgeon: Evans Lance, MD;  Location: [facility] CATH LAB;  Service: Cardiovascular;  Laterality: N/A;  . Right heart catheterization N/A 04/18/2014    Procedure: RIGHT HEART CATH;  Surgeon: Larey Dresser, MD;  Location: [facility] CATH LAB;  Service: Cardiovascular;  Laterality: N/A;  . Esophagogastroduodenoscopy N/A 10/21/2014    Procedure: ESOPHAGOGASTRODUODENOSCOPY (EGD);  Surgeon: Inda Castle, MD;  Location: [facility];  Service: Endoscopy;  Laterality: N/A;  . Colonoscopy Left 10/24/2014    Procedure: COLONOSCOPY;  Surgeon:  Carol Ada, MD;  Location: [facility] ENDOSCOPY;  Service: Endoscopy;  Laterality: Left;  Freda Munro capsule study N/A 10/24/2014    Procedure: GIVENS CAPSULE STUDY;  Surgeon: Carol Ada, MD;  Location[facility] END[facility];  Service: Endoscopy;  Laterality: N/A;    in for   Chief Complaint  Patient presents with  . Sore Throat  . Shortness of Breath     HPI This is a 69 yo female PMH of COPD and former tobacco abuse, paroxysmal atrial fibrillation on Coumadin, diabetes mellitus, nonischemic cardiomyopathy with EF of 15% with associated grade 2 diastolic dysfunction, history of pulmonary embolism, implantation of ICD, and known chronic recurring anemia occasionally heme positive. In reviewing her past medical workup for her anemia dating back to December 2015 patient was admitted with symptomatic anemia was evaluated by hematology was felt to have a combination of iron deficiency as well as anemia related to chronic kidney disease and hypothyroidism/diabetes. Documented during that admission she had not been compliant with recommended gastroenterology evaluation as an outpatient. Subsequently this past spring in April and May 2016 she has  undergone extensive gastroenterology evaluation: EGD April 2016 was negative, colonoscopy May 2016 was negative except for benign polyp, capsule endoscopy in May 2016 revealed scattered angiectasia's. Noted no specific or definitive etiology to patient's known anemia. Most recent anemia panel in April showed normal iron at the low U IBC normal TIBC normal saturation on low ferritin and normal folate noting B12 was not checked. She presents this time with several days of progressive fatigue and shortness of breath. Just last week she was able to participate in her typical exercise program. She is not expressing lower extremity edema or orthopnea. She is not noticed any dark or bloody stools or had any abdominal pain. She does not take iron pills. She reports she has an outpatient nephrology appointment in reasonable range for her primary care physician next Tuesday. He states this appointment has been arranged to scheduled the patient for "shots in my belly". Patient states her weight has been stable around her usual of 148 pounds at home.  When patient presented to the ER her O2 saturations were in the high 80s on and required placement of oxygen. Her hemoglobin was down to 7.6 noting it had been 10 and April. Her chest x-ray showed mild edema. She was given Lasix 40% Department.   Review of Systems   In addition to the HPI above,  No Fever-chills, myalgias or other constitutional symptoms only generalized fatigue No Headache, changes with Vision or hearing, new weakness, tingling, numbness in any extremity, No problems swallowing food or Liquids, indigestion/reflux No Chest pain, Cough or palpitations, orthopnea  No Abdominal pain, N/V; no melena or hematochezia, no dark tarry stools, Bowel movements are regular, No dysuria, hematuria or flank pain No new skin rashes, lesions, masses or bruises, No new joints pains-aches No recent weight gain or loss No polyuria, polydypsia or polyphagia,  *A full  10 point Review of Systems was done, except as stated above, all other Review of Systems were negative.  Social History History  Substance Use Topics  . Smoking status: Former Smoker -- 12 years    Types: Cigarettes  . Smokeless tobacco: Never Used     Comment: 05/2013: Smokes an occasional cigarette.  Says that she doesn't inhale.  . Alcohol Use: No    Resides at: Private residence  Lives with: Husband  Ambulatory status: Without assistive devices prior to admission   Family History Family History  Problem Relation Age of Onset  . Hypertension Mother   . Diabetes Mother   . Coronary artery disease Father   . Diabetes Brother   . Hypertension Brother   . Lung cancer Brother   . Arthritis Other   . Diabetes Other   . Heart disease Other     female < 55     Prior to Admission medications   Medication Sig Start Date End Date Taking? Authorizing Provider  amiodarone (PACERONE) 200 MG tablet Take 1 tablet (200 mg total) by mouth daily. 02/08/14  Yes Imogene Burn, PA-C  carvedilol (COREG) 3.125 MG tablet Take 3 tablets (9.375 mg total) by mouth 2 (two) times daily with a meal. 10/31/14  Yes Larey Dresser, MD  cetirizine (ZYRTEC) 10 MG tablet Take 10 mg by mouth daily as needed for allergies.    Yes Historical Provider, MD  colchicine 0.6 MG tablet Take 0.6 mg by mouth 2 (two) times daily.  12/20/13  Yes Historical Provider, MD  digoxin (LANOXIN) 0.125 MG tablet Take 0.5 tablets (0.0625 mg total) by mouth every other day. 10/26/14  Yes Rushil Sherrye Payor, MD  feeding supplement, ENSURE ENLIVE, (ENSURE ENLIVE) LIQD Take 237 mLs by mouth 2 (two) times daily between meals. 10/26/14  Yes Rushil Sherrye Payor, MD  furosemide (LASIX) 40 MG tablet TAKE 1 AND 1/2 TABLETS BY MOUTH TWICE DAILY. 11/22/14  Yes Jolaine Artist, MD  isosorbide mononitrate (IMDUR) 30 MG 24 hr tablet Take 1 tablet (30 mg total) by mouth daily. 10/26/14  Yes Rushil Sherrye Payor, MD  levothyroxine (SYNTHROID, LEVOTHROID) 25 MCG  tablet Take 1 tablet (25 mcg total) by mouth daily before breakfast. 10/26/14  Yes Rushil Sherrye Payor, MD  metoCLOPramide (REGLAN) 5 MG tablet Take 5 mg by mouth 4 (four) times daily.   Yes Historical Provider, MD  ondansetron (ZOFRAN) 4 MG tablet Take 4 mg by mouth every 4 (four) hours as needed for nausea or vomiting.   Yes Historical Provider, MD  oxyCODONE-acetaminophen (PERCOCET) 10-325 MG per tablet Take 1 tablet by mouth every 6 (six) hours as needed for pain.  12/09/13  Yes Historical Provider, MD  polyethylene glycol powder (GLYCOLAX/MIRALAX) powder Take 1 Container by mouth daily as needed for mild constipation.  11/14/14  Yes Historical Provider, MD  pravastatin (PRAVACHOL) 40 MG tablet Take 80 mg by mouth at bedtime.    Yes Historical Provider, MD  warfarin (COUMADIN) 2.5 MG tablet Take 0.5 tablets (1.25 mg total) by mouth daily at 6 PM. 10/26/14  Yes Rushil Sherrye Payor, MD  furosemide (LASIX) 40 MG tablet TAKE 1 AND 1/2 TABLETS BY MOUTH TWICE DAILY. Patient not taking: Reported on 11/30/2014 11/21/14   Jolaine Artist, MD  ivabradine (CORLANOR) 5 MG TABS tablet Take 0.5 tablets (2.5 mg total) by mouth 2 (two) times daily with a meal. Patient not taking: Reported on 11/30/2014 07/05/14   Jolaine Artist, MD    No Known Allergies  Physical Exam  Vitals  Blood pressure 111/50, pulse 70, temperature 99.6 F (37.6 C), temperature source Oral, resp. rate 18, height 5\' 5"  (1.651 m), weight 69.491 kg (153 lb 3.2 oz), SpO2 99 %.   General:  In no acute distress, appears somewhat chronically ill but otherwise appears stated age  Psych:  Normal affect, Denies Suicidal or Homicidal ideations, Awake Alert, Oriented X 3. Speech and thought patterns are clear and appropriate, no apparent short term memory deficits  Neuro:   No focal neurological deficits,  CN II through XII intact, Strength 5/5 all 4 extremities, Sensation intact all 4 extremities.  ENT:  Ears and Eyes appear Normal, Conjunctivae clear,  PER. Moist oral mucosa without erythema or exudates.  Neck:  Supple, No lymphadenopathy appreciated  Respiratory:  Symmetrical chest wall movement, Good air movement bilaterally, bilateral inspiratory crackles right greater than left, 2L  Cardiac:  RRR, No Murmurs, no LE edema noted, no JVD, No carotid bruits, peripheral pulses palpable at 2+  Abdomen:  Positive bowel sounds, Soft, Non tender, Non distended,  No masses appreciated, no obvious hepatosplenomegaly  Skin:  No Cyanosis, Normal Skin Turgor, No Skin Rash or Bruise.  Extremities: Symmetrical without obvious trauma or injury,  no effusions.  Data Review  CBC  Recent Labs Lab 11/30/14 0825  WBC 9.5  HGB 7.6*  HCT 22.6*  PLT 156  MCV 91.9  MCH 30.9  MCHC 33.6  RDW 15.7*    Chemistries   Recent Labs Lab 11/30/14 0825  NA 133*  K 3.7  CL 98*  CO2 22  GLUCOSE 271*  BUN 33*  CREATININE 2.00*  CALCIUM 8.5*    estimated creatinine clearance is 25.6 mL/min (by C-G formula based on Cr of 2).  No results for input(s): TSH, T4TOTAL, T3FREE, THYROIDAB in the last 72 hours.  Invalid input(s): FREET3  Coagulation profile  Recent Labs Lab 11/30/14 0825  INR 1.51*    No results for input(s): DDIMER in the last 72 hours.  Cardiac Enzymes No results for input(s): CKMB, TROPONINI, MYOGLOBIN in the last 168 hours.  Invalid input(s): CK  Invalid input(s): POCBNP  Urinalysis    Component Value Date/Time   COLORURINE YELLOW 10/17/2014 1050   APPEARANCEUR CLEAR 10/17/2014 1050   LABSPEC 1.012 10/17/2014 1050   PHURINE 6.5 10/17/2014 1050   GLUCOSEU >1000* 10/17/2014 1050   HGBUR NEGATIVE 10/17/2014 1050   BILIRUBINUR NEGATIVE 10/17/2014 1050   KETONESUR NEGATIVE 10/17/2014 1050   PROTEINUR NEGATIVE 10/17/2014 1050   UROBILINOGEN 0.2 10/17/2014 1050   NITRITE NEGATIVE 10/17/2014 1050   LEUKOCYTESUR NEGATIVE 10/17/2014 1050    Imaging results:   Dg Chest 2 View  11/30/2014   CLINICAL DATA:   Shortness of breath.  EXAM: CHEST  2 VIEW  COMPARISON:  10/17/2014.  01/10/2014.  FINDINGS: Mediastinum hilar structures are normal. Cardiac pacer with lead tips in right atrium right ventricle. Cardiomegaly. Mild bilateral interstitial prominence with Kerley B-lines noted. These findings are most consistent mild congestive heart failure. Tiny left pleural effusion cannot be excluded. No pneumothorax. Degenerative changes both shoulders.  IMPRESSION: Findings consistent with mild congestive heart failure with pulmonary interstitial edema and tiny left pleural effusion. Cardiac pacer noted in stable position.   Electronically Signed   By: Marcello Moores  Register   On: 11/30/2014 08:42     EKG: (Independently reviewed) accelerated junctional rhythm versus atrial fibrillation, right bundle branch block, tracing unchanged from prior   Assessment & Plan  Principal Problem:   Symptomatic anemia/  Anemia, normocytic normochromic -Admit to telemetry -Chek H/H stat to confirm hemoglobin reading; if remains low transfuse 1 unit packed red blood cells -Suspect combination of anemia chronic disease in setting of progressive chronic kidney disease may be etiology -Erythropoietin level pending -Based on recent anemia panel is not iron deficient but does have low ferritin indicating diminished response to ongoing anemia -Check B12 -Heme negative initially but will continue to cycle occult blood given previous heme positive readings patient is on Coumadin -Recent GI workup unrevealing  Active Problems:  Diabetes mellitus type 2, uncontrolled  -Based on medication reconciliation does not appear to be on oral agents nor insulin at home -Begin AC/HS CBG checks and provide SSI -Hemoglobin A1c was 9.27 September 2014-given remains uncontrolled will repeat this admission -Carb modified diet     Acute respiratory failure with hypoxia/ Acute on chronic systolic congestive heart failure, NYHA class 3 w/ grade 2 DHF -Seems  mediated by symptomatic anemia with only low-grade heart failure symptoms -Has been given Lasix 40 mg IV 1 and ER -Continue preadmission medications: Digoxin /Hydralazine/Imdur/carvedilol/oral Lasix 60 mg twice a day-no ACE inhibitor or spironolactone with underlying chronic kidney disease -Message sent via Epic to notify cardiology patient has been admitted with minimal heart failure symptoms -If requires packed red blood cells will also give an additional 40 mg IV Lasix after unit infused -Continue oxygen and other supportive care      HTN (hypertension) -Current blood pressure well controlled with current above medical regimen    CKD (chronic kidney disease), stage IV -Renal function remains stable at baseline with BUN 33 and creatinine 2.00 with GFR in the mid 20s -Nephrology consulted -Patient reports PCP is arranged for outpatient nephrology follow-up in [place] with initial visit next Tuesday    Atrial fibrillation/Chronic anticoagulation -Currently rate controlled with underlying right bundle branch block -Continue amiodarone -CHADVASC= 7 -Due to recent anemia changes despite heme negative status will hold warfarin initially; when resume recommend asking pharmacy to manage    COPD (chronic obstructive pulmonary disease)/  Tobacco abuse, in remission -Stable without evidence of wheezing    Dyslipidemia -Need to confirm if patient still taking pravastatin    History of pulmonary embolism -On chronic anticoagulation for atrial fibrillation    DVT Prophylaxis: Warfarin  Family Communication: Husband at bedside  Code Status: Full Code    Condition:  Stable  Discharge disposition: Anticipate discharge back to home environment  Time spent in minutes : 60      Ziair Penson ANP on 11/30/2014 at 11:31 AM  Between 7am to 7pm - Pager - [phone]  After 7pm go to [email or web address] - password [email or web address]  And look for the night coverage person covering me after hours  [facility]

## 2014-11-30 NOTE — ED Notes (Signed)
Patient transported to X-ray 

## 2014-11-30 NOTE — H&P (Signed)
Patient was seen, examined, treatment plan was discussed with the Physician extender. I have directly reviewed the clinical findings, lab, imaging studies and management of this patient in detail. I have made the necessary changes to the above noted documentation, and agree with the documentation, as recorded by the Physician extender.  69 year old female with past medical history significant for chronic systolic and diastolic congestive heart failure, last 2-D echo in April 2016 with ejection fraction of 15-20% and grade 2 diastolic dysfunction, status post ICD, chronic kidney disease stage IV, atrial fibrillation on anticoagulation with Coumadin, history of pulmonary embolism, diabetes. She also has chronic anemia and has had workup back in December 2015 and per hematology at that time the thought was anemia is secondary to combination of iron deficiency and anemia of chronic kidney disease. Patient underwent EGD in April 2016 and colonoscopy in May 2016 which was essentially unremarkable except for benign polyp identified on colonoscopy. She also had a capsule endoscopy back in May 2016 which revealed scattered angiectasias. Patient presented to [facility] with worsening shortness of breath and fatigue over past couple of days prior to this admission. She reported no associated fevers or chills. No associated chest pain. No lightheadedness or loss of consciousness. No reports of blood in the stool or urine.  In ED, she was hemodynamically stable. Her blood work was significant for hemoglobin of 7.6, creatinine of 2, INR of 1.51. Chest x-ray showed findings consistent with mild congestive heart failure and cardiac pacer in stable position. She was given one dose of Lasix 40 mg IV in ED. Blood transfusion ordered in ED but not yet initiated. Cardiology and nephrology were consulted.  Assessment and plan:  Principal problem: Symptomatic anemia / anemia of chronic disease / iron deficiency  anemia - As mentioned above, anemia likely secondary to combination of findings deficiency and chronic kidney disease - Hemoglobin is 7.6 on this admission. - Hold Coumadin for now. - Transfuse 1 unit of PRBC - If hemoglobin remains stable then will resume Coumadin tomorrow - No reports of active bleed.  *Please refer to Physician extender note for details on assessment and plan below.   Tina Lenz, MD [facility] [phone]   [facility] History and Physical                                                                                    [name], is a 69 y.o. female  MRN: [id number]   DOB - 15-Sep-1945  Admit Date - 11/30/2014  Outpatient Primary MD for the patient is Tina Bellow, MD  Referring MD: [name] / ER  Consulting M.D: [name] / Nephrology  With History of -  Past Medical History  Diagnosis Date  . Cardiomyopathy, nonischemic     a. 1999 nl cath;  b. 12/05 [employer] [place];  c. 10/2005 ICD extraction 2/2 enterococcus bacteremia and Veg on RV lead;  c. 05/2008 low risk Myoview (scarring w/ some evidence of inf ischemia);  d. 11/2012 Echo: EF 15-20%;  e. 01/2013 s/p MDT [place] CRT D, ser # [id number];  f. 05/2013 Echo: EF 15%.  . Enterococcal infection     a. 10/2005 -  AICD-explanted  . Pulmonary embolism     a. 07/2005 after total right hip arthroplasty  . Hilar density     a. infrahilar mass/adenopathy on CT scan 5/07; subsequently  resolved  . LBBB (left bundle branch block)   . GERD (gastroesophageal reflux disease)   . Hyperlipidemia   . Hypertension   . Tobacco abuse     a. discontinued in 1997, and then resumed  . Urinary incontinence   . Anemia     a. mild/chronic  . Villous adenoma of colon     a. tubovillous adenomatous polyp with focal high grade dysplasia; presented with hematochezia - followed by Dr. Laural Golden.  . Implantable cardioverter-defibrillator-CRT- Mdt     a.  01/2013 s/p MDT [place] CRT D, ser # [id number]  . Chronic  systolic CHF (congestive heart failure)     a. 11/2012 Echo: EF 15-20%;  b. 05/2013 TEE EF 15%.  . Pneumonia 07/2005  . Obstructive sleep apnea     a. mild-did not tolerate CPAP (01/24/2013)  . Type II diabetes mellitus   . History of blood transfusion   . Degenerative joint disease     of knees, shoulder, and hips  . PAF (paroxysmal atrial fibrillation)     a. 07/2012 s/p TEE/DCCV;  b. chronic coumadin;  c. 05/2013 Recurrent Afib->TEE/DCCV and amio initiation.  . CKD (chronic kidney disease), stage III     creatinin-1.44 in 1/09; 1.51 in 1/10      Past Surgical History  Procedure Laterality Date  . Pacemaker removal  11/18/05    Enterococcal infection  . Total hip arthroplasty Right 07/2005  . Knee arthroscopy Right 1980's?  . Colonoscopy w/ polypectomy  2009  . Cardioversion N/A 08/20/2012    Procedure: TEE GUIDED CARDIOVERSION;  Surgeon: Yehuda Savannah, MD;  Location: [facility];  Service: Cardiovascular;  Laterality: N/A;  To be done @ bedside  . Tee without cardioversion N/A 08/20/2012    Procedure: TRANSESOPHAGEAL ECHOCARDIOGRAM (TEE);  Surgeon: Yehuda Savannah, MD;  Location: [facility];  Service: Cardiovascular;  Laterality: N/A;  . Bi-ventricular implantable cardioverter defibrillator  (crt-d)  01/24/2013  . A-v cardiac pacemaker insertion  12/05    Biventricular pacemaker/AICD  . Abdominal hysterectomy  1990/92    Initial partial hysterectomy followed by BSO  . Tubal ligation  1980's  . Cardiac catheterization    . Colonoscopy with esophagogastroduodenoscopy (egd) N/A 06/10/2013    Procedure: COLONOSCOPY WITH ESOPHAGOGASTRODUODENOSCOPY (EGD);  Surgeon: Rogene Houston, MD;  Location: [facility];  Service: Endoscopy;  Laterality: N/A;  925  . Tee without cardioversion N/A 06/20/2013    Procedure: TRANSESOPHAGEAL ECHOCARDIOGRAM (TEE);  Surgeon: Dorothy Spark, MD;  Location: [place];  Service: Cardiovascular;  Laterality: N/A;  . Cardioversion N/A 06/20/2013    Procedure:  CARDIOVERSION;  Surgeon: Dorothy Spark, MD;  Location: [facility] ENDOSCOPY;  Service: Cardiovascular;  Laterality: N/A;  . Bi-ventricular implantable cardioverter defibrillator N/A 01/24/2013    Procedure: BI-VENTRICULAR IMPLANTABLE CARDIOVERTER DEFIBRILLATOR  (CRT-D);  Surgeon: Evans Lance, MD;  Location: [facility] CATH LAB;  Service: Cardiovascular;  Laterality: N/A;  . Right heart catheterization N/A 04/18/2014    Procedure: RIGHT HEART CATH;  Surgeon: Larey Dresser, MD;  Location: [facility] CATH LAB;  Service: Cardiovascular;  Laterality: N/A;  . Esophagogastroduodenoscopy N/A 10/21/2014    Procedure: ESOPHAGOGASTRODUODENOSCOPY (EGD);  Surgeon: Inda Castle, MD;  Location: [place];  Service: Endoscopy;  Laterality: N/A;  . Colonoscopy Left 10/24/2014    Procedure:  COLONOSCOPY;  Surgeon: Carol Ada, MD;  Location: [facility] END[facility];  Service: Endoscopy;  Laterality: Left;  Freda Munro capsule study N/A 10/24/2014    Procedure: GIVENS CAPSULE STUDY;  Surgeon: Carol Ada, MD;  Location: [facility] ENDOSCOPY;  Service: Endoscopy;  Laterality: N/A;    in for   Chief Complaint  Patient presents with  . Sore Throat  . Shortness of Breath     HPI This is a 69 yo female PMH of COPD and former tobacco abuse, paroxysmal atrial fibrillation on Coumadin, diabetes mellitus, nonischemic cardiomyopathy with EF of 15% with associated grade 2 diastolic dysfunction, history of pulmonary embolism, implantation of ICD, and known chronic recurring anemia occasionally heme positive. In reviewing her past medical workup for her anemia dating back to December 2015 patient was admitted with symptomatic anemia was evaluated by hematology was felt to have a combination of iron deficiency as well as anemia related to chronic kidney disease and hypothyroidism/diabetes. Documented during that admission she had not been compliant with recommended gastroenterology evaluation as an outpatient. Subsequently this past spring in April and May 2016  she has undergone extensive gastroenterology evaluation: EGD April 2016 was negative, colonoscopy May 2016 was negative except for benign polyp, capsule endoscopy in May 2016 revealed scattered angiectasia's. Noted no specific or definitive etiology to patient's known anemia. Most recent anemia panel in April showed normal iron at the low U IBC normal TIBC normal saturation on low ferritin and normal folate noting B12 was not checked. She presents this time with several days of progressive fatigue and shortness of breath. Just last week she was able to participate in her typical exercise program. She is not expressing lower extremity edema or orthopnea. She is not noticed any dark or bloody stools or had any abdominal pain. She does not take iron pills. She reports she has an outpatient nephrology appointment in reasonable range for her primary care physician next Tuesday. He states this appointment has been arranged to scheduled the patient for "shots in my belly". Patient states her weight has been stable around her usual of 148 pounds at home.  When patient presented to the ER her O2 saturations were in the high 80s on and required placement of oxygen. Her hemoglobin was down to 7.6 noting it had been 10 and April. Her chest x-ray showed mild edema. She was given Lasix 40% Department. BNP was 1322 and TNI was 0.02, INR was subtherapeutic at 1.51   Review of Systems   In addition to the HPI above,  No Fever-chills, myalgias or other constitutional symptoms only generalized fatigue No Headache, changes with Vision or hearing, new weakness, tingling, numbness in any extremity, No problems swallowing food or Liquids, indigestion/reflux No Chest pain, Cough or palpitations, orthopnea  No Abdominal pain, N/V; no melena or hematochezia, no dark tarry stools, Bowel movements are regular, No dysuria, hematuria or flank pain No new skin rashes, lesions, masses or bruises, No new joints pains-aches No recent  weight gain or loss No polyuria, polydypsia or polyphagia,  *A full 10 point Review of Systems was done, except as stated above, all other Review of Systems were negative.  Social History History  Substance Use Topics  . Smoking status: Former Smoker -- 12 years    Types: Cigarettes  . Smokeless tobacco: Never Used     Comment: 05/2013: Smokes an occasional cigarette.  Says that she doesn't inhale.  . Alcohol Use: No    Resides at: Private residence  Lives with: Husband  Ambulatory status: Without assistive devices prior to admission   Family History Family History  Problem Relation Age of Onset  . Hypertension Mother   . Diabetes Mother   . Coronary artery disease Father   . Diabetes Brother   . Hypertension Brother   . Lung cancer Brother   . Arthritis Other   . Diabetes Other   . Heart disease Other     female < 55     Prior to Admission medications   Medication Sig Start Date End Date Taking? Authorizing Provider  amiodarone (PACERONE) 200 MG tablet Take 1 tablet (200 mg total) by mouth daily. 02/08/14  Yes Imogene Burn, PA-C  carvedilol (COREG) 3.125 MG tablet Take 3 tablets (9.375 mg total) by mouth 2 (two) times daily with a meal. 10/31/14  Yes Larey Dresser, MD  cetirizine (ZYRTEC) 10 MG tablet Take 10 mg by mouth daily as needed for allergies.    Yes Historical Provider, MD  colchicine 0.6 MG tablet Take 0.6 mg by mouth 2 (two) times daily.  12/20/13  Yes Historical Provider, MD  digoxin (LANOXIN) 0.125 MG tablet Take 0.5 tablets (0.0625 mg total) by mouth every other day. 10/26/14  Yes Rushil Sherrye Payor, MD  feeding supplement, ENSURE ENLIVE, (ENSURE ENLIVE) LIQD Take 237 mLs by mouth 2 (two) times daily between meals. 10/26/14  Yes Rushil Sherrye Payor, MD  furosemide (LASIX) 40 MG tablet TAKE 1 AND 1/2 TABLETS BY MOUTH TWICE DAILY. 11/22/14  Yes Jolaine Artist, MD  isosorbide mononitrate (IMDUR) 30 MG 24 hr tablet Take 1 tablet (30 mg total) by mouth daily. 10/26/14  Yes  Rushil Sherrye Payor, MD  levothyroxine (SYNTHROID, LEVOTHROID) 25 MCG tablet Take 1 tablet (25 mcg total) by mouth daily before breakfast. 10/26/14  Yes Rushil Sherrye Payor, MD  metoCLOPramide (REGLAN) 5 MG tablet Take 5 mg by mouth 4 (four) times daily.   Yes Historical Provider, MD  ondansetron (ZOFRAN) 4 MG tablet Take 4 mg by mouth every 4 (four) hours as needed for nausea or vomiting.   Yes Historical Provider, MD  oxyCODONE-acetaminophen (PERCOCET) 10-325 MG per tablet Take 1 tablet by mouth every 6 (six) hours as needed for pain.  12/09/13  Yes Historical Provider, MD  polyethylene glycol powder (GLYCOLAX/MIRALAX) powder Take 1 Container by mouth daily as needed for mild constipation.  11/14/14  Yes Historical Provider, MD  pravastatin (PRAVACHOL) 40 MG tablet Take 80 mg by mouth at bedtime.    Yes Historical Provider, MD  warfarin (COUMADIN) 2.5 MG tablet Take 0.5 tablets (1.25 mg total) by mouth daily at 6 PM. 10/26/14  Yes Rushil Sherrye Payor, MD  furosemide (LASIX) 40 MG tablet TAKE 1 AND 1/2 TABLETS BY MOUTH TWICE DAILY. Patient not taking: Reported on 11/30/2014 11/21/14   Jolaine Artist, MD  ivabradine (CORLANOR) 5 MG TABS tablet Take 0.5 tablets (2.5 mg total) by mouth 2 (two) times daily with a meal. Patient not taking: Reported on 11/30/2014 07/05/14   Jolaine Artist, MD    No Known Allergies  Physical Exam  Vitals  Blood pressure 111/50, pulse 70, temperature 99.6 F (37.6 C), temperature source Oral, resp. rate 18, height 5\' 5"  (1.651 m), weight 153 lb 3.2 oz (69.491 kg), SpO2 99 %.   General:  In no acute distress, appears somewhat chronically ill but otherwise appears stated age  Psych:  Normal affect, Denies Suicidal or Homicidal ideations, Awake Alert, Oriented X 3. Speech and thought patterns are clear and  appropriate, no apparent short term memory deficits  Neuro:   No focal neurological deficits, CN II through XII intact, Strength 5/5 all 4 extremities, Sensation intact all 4  extremities.  ENT:  Ears and Eyes appear Normal, Conjunctivae clear, PER. Moist oral mucosa without erythema or exudates.  Neck:  Supple, No lymphadenopathy appreciated  Respiratory:  Symmetrical chest wall movement, Good air movement bilaterally, bilateral inspiratory crackles right greater than left, 2L  Cardiac:  RRR, No Murmurs, no LE edema noted, no JVD, No carotid bruits, peripheral pulses palpable at 2+  Abdomen:  Positive bowel sounds, Soft, Non tender, Non distended,  No masses appreciated, no obvious hepatosplenomegaly  Skin:  No Cyanosis, Normal Skin Turgor, No Skin Rash or Bruise.  Extremities: Symmetrical without obvious trauma or injury,  no effusions.  Data Review  CBC  Recent Labs Lab 11/30/14 0825  WBC 9.5  HGB 7.6*  HCT 22.6*  PLT 156  MCV 91.9  MCH 30.9  MCHC 33.6  RDW 15.7*    Chemistries   Recent Labs Lab 11/30/14 0825  NA 133*  K 3.7  CL 98*  CO2 22  GLUCOSE 271*  BUN 33*  CREATININE 2.00*  CALCIUM 8.5*    estimated creatinine clearance is 25.6 mL/min (by C-G formula based on Cr of 2).  No results for input(s): TSH, T4TOTAL, T3FREE, THYROIDAB in the last 72 hours.  Invalid input(s): FREET3  Coagulation profile  Recent Labs Lab 11/30/14 0825  INR 1.51*    No results for input(s): DDIMER in the last 72 hours.  Cardiac Enzymes No results for input(s): CKMB, TROPONINI, MYOGLOBIN in the last 168 hours.  Invalid input(s): CK  Invalid input(s): POCBNP  Urinalysis    Component Value Date/Time   COLORURINE YELLOW 10/17/2014 1050   APPEARANCEUR CLEAR 10/17/2014 1050   LABSPEC 1.012 10/17/2014 1050   PHURINE 6.5 10/17/2014 1050   GLUCOSEU >1000* 10/17/2014 1050   HGBUR NEGATIVE 10/17/2014 1050   BILIRUBINUR NEGATIVE 10/17/2014 1050   KETONESUR NEGATIVE 10/17/2014 1050   PROTEINUR NEGATIVE 10/17/2014 1050   UROBILINOGEN 0.2 10/17/2014 1050   NITRITE NEGATIVE 10/17/2014 1050   LEUKOCYTESUR NEGATIVE 10/17/2014 1050     Imaging results:   Dg Chest 2 View  11/30/2014   CLINICAL DATA:  Shortness of breath.  EXAM: CHEST  2 VIEW  COMPARISON:  10/17/2014.  01/10/2014.  FINDINGS: Mediastinum hilar structures are normal. Cardiac pacer with lead tips in right atrium right ventricle. Cardiomegaly. Mild bilateral interstitial prominence with Kerley B-lines noted. These findings are most consistent mild congestive heart failure. Tiny left pleural effusion cannot be excluded. No pneumothorax. Degenerative changes both shoulders.  IMPRESSION: Findings consistent with mild congestive heart failure with pulmonary interstitial edema and tiny left pleural effusion. Cardiac pacer noted in stable position.   Electronically Signed   By: Marcello Moores  Register   On: 11/30/2014 08:42     EKG: (Independently reviewed) accelerated junctional rhythm versus atrial fibrillation, right bundle branch block, tracing unchanged from prior   Assessment & Plan  Principal Problem:   Symptomatic anemia/  Anemia, normocytic normochromic -Admit to telemetry -Chek H/H stat to confirm hemoglobin reading; if remains low transfuse 1 unit packed red blood cells -Suspect combination of anemia chronic disease in setting of progressive chronic kidney disease may be etiology -Erythropoietin level pending -Based on recent anemia panel is not iron deficient but does have low ferritin indicating diminished response to ongoing anemia -Check B12 -Heme negative initially but will continue to cycle occult blood given  previous heme positive readings patient is on Coumadin -Recent GI workup unrevealing  Active Problems:   Diabetes mellitus type 2, uncontrolled  -Based on medication reconciliation does not appear to be on oral agents nor insulin at home -Begin AC/HS CBG checks and provide SSI -Hemoglobin A1c was 9.27 September 2014-given remains uncontrolled will repeat this admission -Carb modified diet     Acute respiratory failure with hypoxia/ Acute on chronic  systolic congestive heart failure, NYHA class 3 w/ grade 2 DHF -Seems mediated by symptomatic anemia with only low-grade heart failure symptoms -Has been given Lasix 40 mg IV 1 and ER -Continue preadmission medications: Digoxin /Hydralazine/Imdur/carvedilol/oral Lasix 60 mg twice a day-no ACE inhibitor or spironolactone with underlying chronic kidney disease -Message sent via Epic to notify cardiology patient has been admitted with minimal heart failure symptoms -If requires packed red blood cells will also give an additional 40 mg IV Lasix after unit infused -Continue oxygen and other supportive care -edema type findings on CXR could represent viral pneumonitis    HTN (hypertension) -Current blood pressure well controlled with current above medical regimen    CKD (chronic kidney disease), stage IV -Renal function remains stable at baseline with BUN 33 and creatinine 2.00 with GFR in the mid 20s -Nephrology consulted -Patient reports PCP has arranged for outpatient nephrology follow-up in [place] with initial visit next Tuesday    Atrial fibrillation/Chronic anticoagulation -Currently rate controlled with underlying right bundle branch block -Continue amiodarone -CHADVASC= 7 -Due to recent anemia changes despite heme negative status will hold warfarin initially; when resume recommend asking pharmacy to manage-current INR < 2.0   Hypothyroidism -cont Synthroid    COPD (chronic obstructive pulmonary disease)/  Tobacco abuse, in remission -Stable without evidence of wheezing    Dyslipidemia -Need to confirm if patient still taking pravastatin    History of pulmonary embolism -On chronic anticoagulation for atrial fibrillation    DVT Prophylaxis: Warfarin  Family Communication: Husband at bedside  Code Status: Full Code    Condition:  Stable  Discharge disposition: Anticipate discharge back to home environment  Time spent in minutes : 60      ELLIS,ALLISON L. ANP  on 11/30/2014 at 10:40 AM  Between 7am to 7pm - Pager - [phone]  After 7pm go to [email or web address] - password [email or web address]  And look for the night coverage person covering me after hours  [facility]

## 2014-11-30 NOTE — ED Notes (Signed)
Per EMS: pt from home for eval of sore throat and pain swallowing x3 days, pt also reports sob that started today, denies any cp at this time. EMS noted pt lung sounds clear, no edema noted to extremities, and dry cough per pt report. Pt 93% on room air upon arrival. nad noted. Axo x4.

## 2014-11-30 NOTE — Consult Note (Addendum)
Patient ID: [name] MRN: [id number], DOB/AGE: 22-Feb-1946   Admit date: 11/30/2014   Primary Physician: Robert Bellow, MD Primary Cardiologist: Dr. Haroldine Laws Electrophysiologist: Dr. Lovena Le  Pt. Profile:  69 y/o female with long standing history of combined systolic + diastolic CHF secondary to nonischemic cardiomyopathy (EF 15-20%), s/p BiV ICD, PAF on chronic warfarin therapy, CKD and IDA (scheduled to start monthly aranesp injections next week), presenting with progressive weakness and dyspnea, in the setting of worsening anemia and acute on chronic combined systolic + diastolic CHF.   Problem List  Past Medical History  Diagnosis Date  . Cardiomyopathy, nonischemic     a. 1999 nl cath;  b. 12/05 [employer];  c. 10/2005 ICD extraction 2/2 enterococcus bacteremia and Veg on RV lead;  c. 05/2008 low risk Myoview (scarring w/ some evidence of inf ischemia);  d. 11/2012 Echo: EF 15-20%;  e. 01/2013 s/p MDT [place] CRT D, ser # [id number];  f. 05/2013 Echo: EF 15%.  . Enterococcal infection     a. 10/2005 - AICD-explanted  . Pulmonary embolism     a. 07/2005 after total right hip arthroplasty  . Hilar density     a. infrahilar mass/adenopathy on CT scan 5/07; subsequently  resolved  . LBBB (left bundle branch block)   . GERD (gastroesophageal reflux disease)   . Hyperlipidemia   . Hypertension   . Tobacco abuse     a. discontinued in 1997, and then resumed  . Urinary incontinence   . Anemia     a. mild/chronic  . Villous adenoma of colon     a. tubovillous adenomatous polyp with focal high grade dysplasia; presented with hematochezia - followed by Dr. Laural Golden.  . Implantable cardioverter-defibrillator-CRT- Mdt     a.  01/2013 s/p MDT [place] CRT D, ser # [id number]  . Chronic systolic CHF (congestive heart failure)     a. 11/2012 Echo: EF 15-20%;  b. 05/2013 TEE EF 15%.  . Pneumonia 07/2005  . Obstructive sleep apnea     a. mild-did not tolerate CPAP (01/24/2013)  .  Type II diabetes mellitus   . History of blood transfusion   . Degenerative joint disease     of knees, shoulder, and hips  . PAF (paroxysmal atrial fibrillation)     a. 07/2012 s/p TEE/DCCV;  b. chronic coumadin;  c. 05/2013 Recurrent Afib->TEE/DCCV and amio initiation.  . CKD (chronic kidney disease), stage III     creatinin-1.44 in 1/09; 1.51 in 1/10    Past Surgical History  Procedure Laterality Date  . Pacemaker removal  11/18/05    Enterococcal infection  . Total hip arthroplasty Right 07/2005  . Knee arthroscopy Right 1980's?  . Colonoscopy w/ polypectomy  2009  . Cardioversion N/A 08/20/2012    Procedure: TEE GUIDED CARDIOVERSION;  Surgeon: Yehuda Savannah, MD;  Location: [facility];  Service: Cardiovascular;  Laterality: N/A;  To be done @ bedside  . Tee without cardioversion N/A 08/20/2012    Procedure: TRANSESOPHAGEAL ECHOCARDIOGRAM (TEE);  Surgeon: Yehuda Savannah, MD;  Location: [facility];  Service: Cardiovascular;  Laterality: N/A;  . Bi-ventricular implantable cardioverter defibrillator  (crt-d)  01/24/2013  . A-v cardiac pacemaker insertion  12/05    Biventricular pacemaker/AICD  . Abdominal hysterectomy  1990/92    Initial partial hysterectomy followed by BSO  . Tubal ligation  1980's  . Cardiac catheterization    . Colonoscopy with esophagogastroduodenoscopy (egd) N/A 06/10/2013    Procedure: COLONOSCOPY WITH  ESOPHAGOGASTRODUODENOSCOPY (EGD);  Surgeon: Rogene Houston, MD;  Location: [facility];  Service: Endoscopy;  Laterality: N/A;  925  . Tee without cardioversion N/A 06/20/2013    Procedure: TRANSESOPHAGEAL ECHOCARDIOGRAM (TEE);  Surgeon: Dorothy Spark, MD;  Location: [place];  Service: Cardiovascular;  Laterality: N/A;  . Cardioversion N/A 06/20/2013    Procedure: CARDIOVERSION;  Surgeon: Dorothy Spark, MD;  Location: [facility] ENDOSCOPY;  Service: Cardiovascular;  Laterality: N/A;  . Bi-ventricular implantable cardioverter defibrillator N/A 01/24/2013     Procedure: BI-VENTRICULAR IMPLANTABLE CARDIOVERTER DEFIBRILLATOR  (CRT-D);  Surgeon: Evans Lance, MD;  Location: [facility] CATH LAB;  Service: Cardiovascular;  Laterality: N/A;  . Right heart catheterization N/A 04/18/2014    Procedure: RIGHT HEART CATH;  Surgeon: Larey Dresser, MD;  Location: [facility] CATH LAB;  Service: Cardiovascular;  Laterality: N/A;  . Esophagogastroduodenoscopy N/A 10/21/2014    Procedure: ESOPHAGOGASTRODUODENOSCOPY (EGD);  Surgeon: Inda Castle, MD;  Location: [place];  Service: Endoscopy;  Laterality: N/A;  . Colonoscopy Left 10/24/2014    Procedure: COLONOSCOPY;  Surgeon: Carol Ada, MD;  Location: [facility] ENDOSCOPY;  Service: Endoscopy;  Laterality: Left;  Freda Munro capsule study N/A 10/24/2014    Procedure: GIVENS CAPSULE STUDY;  Surgeon: Carol Ada, MD;  Location: [facility] ENDOSCOPY;  Service: Endoscopy;  Laterality: N/A;     Allergies  No Known Allergies  HPI  The patient is a 69 y/o female with history of chronic systolic CHF secondary to nonischemic cardiomyopathy, first diagnosed in 1999. LHC at that time showed no significant coronary disease. Her history is also notable for prior PE, CKD, IDA and PAF on chronic anticoagulation with warfarin. She had her initial CRT-D device in 2005. This was removed in 2007 due to enterococcal bacteremia with vegetation. She had a [employer] CRT- D device implanted in 8/14. She is unable to do CPX testing and would not be a LVAD candidate. She has had trouble with paroxysmal atrial fibrillation. In Dec. 2014, she was put on amiodarone and cardioverted. TEE in 12/14 showed EF 15% but RV appeared normal. Her most recent 2D echo was 10/18/14. EF was 15-20%. Grade 2DD also noted as well as mild-moderate AI. Most recently, in May of this year, she was admitted for profound anemia with a hgb ~6.4. She received a total of 5 units PRBCs. No definite GI bleeding source noted during work-up. She was eventually restarted on warfarin. She is scheduled to  start aranesp injections every 3 weeks.  [facility] by Dr. Haroldine Laws. Dr. Lovena Le follows her ICD. She was last seen in the [facility] by Dr. Haroldine Laws on 11/02/14 and was doing well from a HF perspective. Her volume status was stable. Office weight was 148 lb. Hgb was stable at 10.2. She was instructed to f/u in 4 weeks. Appointment is scheduled for 6/13. Her next device check is scheduled for 6/20.    She reports to the [facility] ED today with complaints of progressive weakness and dyspnea. She was hypoxic on arrival with O2 stats in the upper 80s on RA. She was started on supplemental O2 via [place]. O2 sats now stable in the mid 90s. Hgb is back down to 7.6 (10.2 ~4 weeks ago). INR is subtherapeutic at 1.51. BNP is also elevated at 1322.3 (488.1 one month ago). CXR findings are c/w mild CHF with pulmonary interstitial edema and tiny left pleural effusion. Her weight in the ED is up at 153 lb (dry weight =148 lb). Her SCr  is 2.00 (baseline ~1.6-1.8). EKG shows ? Accelerated junctional rhythm and RBBB.  She reports full medication compliance with all of her HF meds including lasix. She has been adherant with daily weights. Her weight at home has remained stable between 148-150 lb. She admits to some dietary indiscretion with sodium recently (ate chicken nuggets at a fast food restaurant 2 nights ago). She notes orthopnea and PND last night and early this morning. No LEE or chest pain. She also denies melena/ hematochezia.   I  Home Medications  Prior to Admission medications   Medication Sig Start Date End Date Taking? Authorizing Provider  amiodarone (PACERONE) 200 MG tablet Take 1 tablet (200 mg total) by mouth daily. 02/08/14  Yes Imogene Burn, PA-C  carvedilol (COREG) 3.125 MG tablet Take 3 tablets (9.375 mg total) by mouth 2 (two) times daily with a meal. 10/31/14  Yes Larey Dresser, MD  cetirizine (ZYRTEC) 10 MG tablet Take 10 mg by mouth daily as needed for allergies.    Yes  Historical Provider, MD  colchicine 0.6 MG tablet Take 0.6 mg by mouth 2 (two) times daily.  12/20/13  Yes Historical Provider, MD  digoxin (LANOXIN) 0.125 MG tablet Take 0.5 tablets (0.0625 mg total) by mouth every other day. 10/26/14  Yes Rushil Sherrye Payor, MD  feeding supplement, ENSURE ENLIVE, (ENSURE ENLIVE) LIQD Take 237 mLs by mouth 2 (two) times daily between meals. 10/26/14  Yes Rushil Sherrye Payor, MD  furosemide (LASIX) 40 MG tablet TAKE 1 AND 1/2 TABLETS BY MOUTH TWICE DAILY. 11/22/14  Yes Jolaine Artist, MD  isosorbide mononitrate (IMDUR) 30 MG 24 hr tablet Take 1 tablet (30 mg total) by mouth daily. 10/26/14  Yes Rushil Sherrye Payor, MD  levothyroxine (SYNTHROID, LEVOTHROID) 25 MCG tablet Take 1 tablet (25 mcg total) by mouth daily before breakfast. 10/26/14  Yes Rushil Sherrye Payor, MD  metoCLOPramide (REGLAN) 5 MG tablet Take 5 mg by mouth 4 (four) times daily.   Yes Historical Provider, MD  ondansetron (ZOFRAN) 4 MG tablet Take 4 mg by mouth every 4 (four) hours as needed for nausea or vomiting.   Yes Historical Provider, MD  oxyCODONE-acetaminophen (PERCOCET) 10-325 MG per tablet Take 1 tablet by mouth every 6 (six) hours as needed for pain.  12/09/13  Yes Historical Provider, MD  polyethylene glycol powder (GLYCOLAX/MIRALAX) powder Take 1 Container by mouth daily as needed for mild constipation.  11/14/14  Yes Historical Provider, MD  pravastatin (PRAVACHOL) 40 MG tablet Take 80 mg by mouth at bedtime.    Yes Historical Provider, MD  warfarin (COUMADIN) 2.5 MG tablet Take 0.5 tablets (1.25 mg total) by mouth daily at 6 PM. 10/26/14  Yes Rushil Sherrye Payor, MD  furosemide (LASIX) 40 MG tablet TAKE 1 AND 1/2 TABLETS BY MOUTH TWICE DAILY. Patient not taking: Reported on 11/30/2014 11/21/14   Jolaine Artist, MD  ivabradine (CORLANOR) 5 MG TABS tablet Take 0.5 tablets (2.5 mg total) by mouth 2 (two) times daily with a meal. Patient not taking: Reported on 11/30/2014 07/05/14   Jolaine Artist, MD    Family  History  Family History  Problem Relation Age of Onset  . Hypertension Mother   . Diabetes Mother   . Coronary artery disease Father   . Diabetes Brother   . Hypertension Brother   . Lung cancer Brother   . Arthritis Other   . Diabetes Other   . Heart disease Other     female < 27  Social History  History   Social History  . Marital Status: Married    Spouse Name: N/A  . Number of Children: N/A  . Years of Education: N/A   Occupational History  . Not on file.   Social History Main Topics  . Smoking status: Former Smoker -- 12 years    Types: Cigarettes  . Smokeless tobacco: Never Used     Comment: 05/2013: Smokes an occasional cigarette.  Says that she doesn't inhale.  . Alcohol Use: No  . Drug Use: No  . Sexual Activity: Not Currently    Birth Control/ Protection: None   Other Topics Concern  . Not on file   Social History Narrative   Married, lives in [place] with spouse. Retired Secretary/administrator.      Review of Systems General:  No chills, fever, night sweats or weight changes.  Cardiovascular:  No chest pain, dyspnea on exertion, edema, orthopnea, palpitations, paroxysmal nocturnal dyspnea. Dermatological: No rash, lesions/masses Respiratory: No cough, dyspnea Urologic: No hematuria, dysuria Abdominal:   No nausea, vomiting, diarrhea, bright red blood per rectum, melena, or hematemesis Neurologic:  No visual changes, wkns, changes in mental status. All other systems reviewed and are otherwise negative except as noted above.  Physical Exam  Blood pressure 111/50, pulse 70, temperature 99.6 F (37.6 C), temperature source Oral, resp. rate 18, height 5\' 5"  (1.651 m), weight 153 lb 3.2 oz (69.491 kg), SpO2 99 %.  General: Pleasant, NAD Psych: Normal affect. Neuro: Alert and oriented X 3. Moves all extremities spontaneously. HEENT: Normal  Neck: Supple without bruits. Mild JVD. No HJR Lungs:  Resp regular and unlabored, CTA. Heart: RRR no s3, s4, 2/6 SM  best heard along the LSB. Abdomen: Soft, non-tender, non-distended, BS + x 4.  Extremities: No clubbing, cyanosis or edema. DP/PT/Radials 2+ and equal bilaterally.  Labs  Troponin [facility] of Care Test)  Recent Labs  11/30/14 0838  TROPIPOC 0.02   No results for input(s): CKTOTAL, CKMB, TROPONINI in the last 72 hours. Lab Results  Component Value Date   WBC 9.5 11/30/2014   HGB 7.6* 11/30/2014   HCT 22.6* 11/30/2014   MCV 91.9 11/30/2014   PLT 156 11/30/2014    Recent Labs Lab 11/30/14 0825  NA 133*  K 3.7  CL 98*  CO2 22  BUN 33*  CREATININE 2.00*  CALCIUM 8.5*  GLUCOSE 271*   Lab Results  Component Value Date   CHOL 171 05/27/2010   HDL 38 05/27/2010   LDLCALC 102 05/27/2010   TRIG 154 05/27/2010   No results found for: DDIMER   Radiology/Studies  Dg Chest 2 View  11/30/2014   CLINICAL DATA:  Shortness of breath.  EXAM: CHEST  2 VIEW  COMPARISON:  10/17/2014.  01/10/2014.  FINDINGS: Mediastinum hilar structures are normal. Cardiac pacer with lead tips in right atrium right ventricle. Cardiomegaly. Mild bilateral interstitial prominence with Kerley B-lines noted. These findings are most consistent mild congestive heart failure. Tiny left pleural effusion cannot be excluded. No pneumothorax. Degenerative changes both shoulders.  IMPRESSION: Findings consistent with mild congestive heart failure with pulmonary interstitial edema and tiny left pleural effusion. Cardiac pacer noted in stable position.   Electronically Signed   By: Marcello Moores  Register   On: 11/30/2014 08:42    ECG  Accelerated junctional rhythm and RBBB.   ASSESSMENT AND PLAN  Principal Problem:   Symptomatic anemia Active Problems:   Dyslipidemia   HTN (hypertension)   History of pulmonary embolism  Type 2 diabetes, uncontrolled, with renal manifestation   Tobacco abuse, in remission   CKD (chronic kidney disease), stage IV   Chronic combined systolic and diastolic CHF (congestive heart  failure)   Chronic anticoagulation   Atrial fibrillation   Implantable cardioverter-defibrillator-CRT- Mdt   COPD (chronic obstructive pulmonary disease)   Anemia of chronic disease   Hypothyroidism   1. Acute on Chronic Combined Systolic + Diastolic CHF: EF 22-29%. Increased O2 requirements. Mild JVD. Lungs are CTAB and no LEE. Her weight is up 5 lb beyond her dry weight. BNP also elevated at 1300. CXR consistent with mild edema. In the setting of recent dietary indiscretion with sodium. SCr slightly up from baseline. Give dose of IV lasix x 1 now and in between blood transfusions. Reassess renal function in the am with BMP. Continue HF meds. No ACE/ARB due to CKD. Check daily weights, monitor/record strict I/Os. Low sodium diet. I suspect her acute on chronic anemia is also contributing to her dyspnea.   2. Acute on Chronic Anemia: Hgb now at 7.6. 10.2 four weeks ago. Management per IM. Diurese in between transfusions to avoid volume overload.   3. PAF: SR. Continue BB and amiodarone therapy. Hold warfarin given acute anemia.   4. Nonischemic Cardiomyopathy: EF 15-20%. Has BiV ICD followed by Dr. Lovena Le. Continue BB therapy with Coreg. Not on ACE/ARB 2/2 CKD. Continue nitrates if BP allows, digoxin and Corlanor.   Signed, Lyda Jester, PA-C 11/30/2014, 11:24 AM  The patient was seen, examined and discussed with Brittainy M. Rosita Fire, PA-C and I agree with the above.   69 year old female with known NICMP, LVEF 15-20%, s/p CRT-D, recently admitted with severe anemia, s/p transfusion of 5 units of PRBC Hb 6.4--> 10.2g/dL, no source of bleeding was identified, she is on chronic anticoagulation with warfarin for PAF. She was scheduled for an outpatient infusion of iv iron on 6/16. The patient is admitted with acute on chronic combined systolic and diastolic CHF (woke up with PND the last nigh), sec to recurrent severe anemia and diet non-compliance (fast food). She has also experienced  chills the last night and is being worked up for infectious causes by primary team. She is scheduled to receive 2 units of PRBC.  On physical exam she is mildly fluid overloaded (JVD + 6 cm, minimal rales at the bases) and has 5 extra lbs (148--> 153 lbs) compared to the discharge a month ago. She was hypoxic on admission, resolved with O2 via [place]. We will administer 60 mg of iv lasix BID and extra 40 mg iv x 1 after every unit of PRBC. Crea baselien 1.6-2.2, today 2.0, we will monitor.  We will follow.  Dorothy Spark 11/30/2014

## 2014-11-30 NOTE — ED Provider Notes (Signed)
CSN: [id number]     Arrival date & time 11/30/14  8115 History   First MD Initiated Contact with Patient 11/30/14 [id number]     Chief Complaint  Patient presents with  . Sore Throat  . Shortness of Breath     (Consider location/radiation/quality/duration/timing/severity/associated sxs/prior Treatment) Patient is a 69 y.o. female presenting with pharyngitis and shortness of breath.  Sore Throat  Shortness of Breath    TEKESHIA KLAHR is a(n) 69 y.o. female who presents to the emergency department with chief complaint of sore throat and shortness of breath. The past medical history of nonischemic cardiomyopathy, type 2 diabetes, paroxysmal A. fib, chronic systolic heart failure with EF of 15%. She is on chronic Coumadin. She has a history of previous pulmonary embolus after hip arthroplasty in 2007. Patient also has a history of COPD and chronic tobacco abuse. She states she is not on oxygen at home. During history of present illness. Patient frequently desaturates to 87%. The patient states she's had about 3 days of sore throat and pain with swallowing. She states it is mild, relieved by Tylenol. She has pain with swallowing but denies difficulty swallowing, stridor, swelling in the throat. The patient also complains of shortness of breath. Her husband states that he knows she is having some difficulty getting up out of bed today because of her chronic left hip arthritis. He states that he has to get up and noticed that she was breathing very rapidly and shallowly. She states that she has felt very short of breath since that time. She denies any weight gain or peripheral edema. She is on Lasix. The patient did recently have an injury 3 days ago when she tripped coming up the stairs and fell onto her hands and knees. She denies any unilateral leg swelling or pain. She continues to feel short of breath. The patient has a previous history of GI bleed but denies any melena or hematochezia. Denies cold  intolerance, extreme fatigue, lightheadedness. She denies any chest pain, diaphoresis, nausea, vomiting.   Past Medical History  Diagnosis Date  . Cardiomyopathy, nonischemic     a. 1999 nl cath;  b. 12/05 [facility] [place];  c. 10/2005 ICD extraction 2/2 enterococcus bacteremia and Veg on RV lead;  c. 05/2008 low risk Myoview (scarring w/ some evidence of inf ischemia);  d. 11/2012 Echo: EF 15-20%;  e. 01/2013 s/p MDT [place] CRT D, ser # [id number];  f. 05/2013 Echo: EF 15%.  . Enterococcal infection     a. 10/2005 - AICD-explanted  . Pulmonary embolism     a. 07/2005 after total right hip arthroplasty  . Hilar density     a. infrahilar mass/adenopathy on CT scan 5/07; subsequently  resolved  . LBBB (left bundle branch block)   . GERD (gastroesophageal reflux disease)   . Hyperlipidemia   . Hypertension   . Tobacco abuse     a. discontinued in 1997, and then resumed  . Urinary incontinence   . Anemia     a. mild/chronic  . Villous adenoma of colon     a. tubovillous adenomatous polyp with focal high grade dysplasia; presented with hematochezia - followed by Dr. Laural Golden.  . Implantable cardioverter-defibrillator-CRT- Mdt     a.  01/2013 s/p MDT [place] CRT D, ser # [id number]  . Chronic systolic CHF (congestive heart failure)     a. 11/2012 Echo: EF 15-20%;  b. 05/2013 TEE EF 15%.  . Pneumonia 07/2005  . Obstructive sleep apnea  a. mild-did not tolerate CPAP (01/24/2013)  . Type II diabetes mellitus   . History of blood transfusion   . Degenerative joint disease     of knees, shoulder, and hips  . PAF (paroxysmal atrial fibrillation)     a. 07/2012 s/p TEE/DCCV;  b. chronic coumadin;  c. 05/2013 Recurrent Afib->TEE/DCCV and amio initiation.  . CKD (chronic kidney disease), stage III     creatinin-1.44 in 1/09; 1.51 in 1/10   Past Surgical History  Procedure Laterality Date  . Pacemaker removal  11/18/05    Enterococcal infection  . Total hip arthroplasty Right 07/2005  . Knee  arthroscopy Right 1980's?  . Colonoscopy w/ polypectomy  2009  . Cardioversion N/A 08/20/2012    Procedure: TEE GUIDED CARDIOVERSION;  Surgeon: Yehuda Savannah, MD;  Location: [facility];  Service: Cardiovascular;  Laterality: N/A;  To be done @ bedside  . Tee without cardioversion N/A 08/20/2012    Procedure: TRANSESOPHAGEAL ECHOCARDIOGRAM (TEE);  Surgeon: Yehuda Savannah, MD;  Location: [facility];  Service: Cardiovascular;  Laterality: N/A;  . Bi-ventricular implantable cardioverter defibrillator  (crt-d)  01/24/2013  . A-v cardiac pacemaker insertion  12/05    Biventricular pacemaker/AICD  . Abdominal hysterectomy  1990/92    Initial partial hysterectomy followed by BSO  . Tubal ligation  1980's  . Cardiac catheterization    . Colonoscopy with esophagogastroduodenoscopy (egd) N/A 06/10/2013    Procedure: COLONOSCOPY WITH ESOPHAGOGASTRODUODENOSCOPY (EGD);  Surgeon: Rogene Houston, MD;  Location: [facility];  Service: Endoscopy;  Laterality: N/A;  925  . Tee without cardioversion N/A 06/20/2013    Procedure: TRANSESOPHAGEAL ECHOCARDIOGRAM (TEE);  Surgeon: Dorothy Spark, MD;  Location: [place];  Service: Cardiovascular;  Laterality: N/A;  . Cardioversion N/A 06/20/2013    Procedure: CARDIOVERSION;  Surgeon: Dorothy Spark, MD;  Location[facility] ENDOSCOPY;  Service: Cardiovascular;  Laterality: N/A;  . Bi-ventricular implantable cardioverter defibrillator N/A 01/24/2013    Procedure: BI-VENTRICULAR IMPLANTABLE CARDIOVERTER DEFIBRILLATOR  (CRT-D);  Surgeon: Evans Lance, MD;  Location: [facility] CATH LAB;  Service: Cardiovascular;  Laterality: N/A;  . Right heart catheterization N/A 04/18/2014    Procedure: RIGHT HEART CATH;  Surgeon: Larey Dresser, MD;  Location: [facility] CATH LAB;  Service: Cardiovascular;  Laterality: N/A;  . Esophagogastroduodenoscopy N/A 10/21/2014    Procedure: ESOPHAGOGASTRODUODENOSCOPY (EGD);  Surgeon: Inda Castle, MD;  Location: [place];  Service: Endoscopy;   Laterality: N/A;  . Colonoscopy Left 10/24/2014    Procedure: COLONOSCOPY;  Surgeon: Carol Ada, MD;  Location: [facility] ENDOSCOPY;  Service: Endoscopy;  Laterality: Left;  Freda Munro capsule study N/A 10/24/2014    Procedure: GIVENS CAPSULE STUDY;  Surgeon: Carol Ada, MD;  Location: [facility] ENDOSCOPY;  Service: Endoscopy;  Laterality: N/A;   Family History  Problem Relation Age of Onset  . Hypertension Mother   . Diabetes Mother   . Coronary artery disease Father   . Diabetes Brother   . Hypertension Brother   . Lung cancer Brother   . Arthritis Other   . Diabetes Other   . Heart disease Other     female < 55   History  Substance Use Topics  . Smoking status: Former Smoker -- 12 years    Types: Cigarettes  . Smokeless tobacco: Never Used     Comment: 05/2013: Smokes an occasional cigarette.  Says that she doesn't inhale.  . Alcohol Use: No   OB History    No data available     Review  of Systems  Respiratory: Positive for shortness of breath.     Ten systems reviewed and are negative for acute change, except as noted in the HPI.    Allergies  Review of patient's allergies indicates no known allergies.  Home Medications   Prior to Admission medications   Medication Sig Start Date End Date Taking? Authorizing Provider  amiodarone (PACERONE) 200 MG tablet Take 1 tablet (200 mg total) by mouth daily. 02/08/14   Imogene Burn, PA-C  carvedilol (COREG) 3.125 MG tablet Take 3 tablets (9.375 mg total) by mouth 2 (two) times daily with a meal. 10/31/14   Larey Dresser, MD  cetirizine (ZYRTEC) 10 MG tablet Take 10 mg by mouth daily.    Historical Provider, MD  colchicine 0.6 MG tablet Take 0.6 mg by mouth 2 (two) times daily.  12/20/13   Historical Provider, MD  digoxin (LANOXIN) 0.125 MG tablet Take 0.5 tablets (0.0625 mg total) by mouth every other day. 10/26/14   Riccardo Dubin, MD  feeding supplement, ENSURE ENLIVE, (ENSURE ENLIVE) LIQD Take 237 mLs by mouth 2 (two) times daily between  meals. 10/26/14   Rushil Sherrye Payor, MD  furosemide (LASIX) 40 MG tablet TAKE 1 AND 1/2 TABLETS BY MOUTH TWICE DAILY. 11/22/14   Jolaine Artist, MD  furosemide (LASIX) 40 MG tablet TAKE 1 AND 1/2 TABLETS BY MOUTH TWICE DAILY. 11/21/14   Jolaine Artist, MD  isosorbide mononitrate (IMDUR) 30 MG 24 hr tablet Take 1 tablet (30 mg total) by mouth daily. 10/26/14   Rushil Sherrye Payor, MD  ivabradine (CORLANOR) 5 MG TABS tablet Take 0.5 tablets (2.5 mg total) by mouth 2 (two) times daily with a meal. 07/05/14   Jolaine Artist, MD  levothyroxine (SYNTHROID, LEVOTHROID) 25 MCG tablet Take 1 tablet (25 mcg total) by mouth daily before breakfast. 10/26/14   Riccardo Dubin, MD  metoCLOPramide (REGLAN) 5 MG tablet Take 5 mg by mouth 4 (four) times daily.    Historical Provider, MD  oxyCODONE-acetaminophen (PERCOCET) 10-325 MG per tablet Take 1 tablet by mouth every 6 (six) hours as needed for pain.  12/09/13   Historical Provider, MD  pravastatin (PRAVACHOL) 40 MG tablet Take 80 mg by mouth at bedtime.     Historical Provider, MD  warfarin (COUMADIN) 2.5 MG tablet Take 0.5 tablets (1.25 mg total) by mouth daily at 6 PM. 10/26/14   Rushil Sherrye Payor, MD   BP 121/48 mmHg  Pulse 75  Temp(Src) 99.6 F (37.6 C) (Oral)  Resp 25  Ht 5\' 5"  (1.651 m)  Wt 153 lb 3.2 oz (69.491 kg)  BMI 25.49 kg/m2  SpO2 92% Physical Exam  Constitutional: She is oriented to person, place, and time. She appears well-developed and well-nourished. No distress.  HENT:  Head: Normocephalic and atraumatic.  Eyes: Conjunctivae and EOM are normal. Pupils are equal, round, and reactive to light. No scleral icterus.  Neck: Normal range of motion.  Cardiovascular: Normal rate, regular rhythm, normal heart sounds and intact distal pulses.  Exam reveals no gallop and no friction rub.   No murmur heard. Pulmonary/Chest: Effort normal and breath sounds normal. No respiratory distress. She has no wheezes. She exhibits no tenderness.  Abdominal: Soft.  Bowel sounds are normal. She exhibits no distension and no mass. There is no tenderness. There is no guarding.  Neurological: She is alert and oriented to person, place, and time.  Skin: Skin is warm and dry. She is not diaphoretic.  Nursing note  and vitals reviewed.   ED Course  Procedures (including critical care time) Labs Review Labs Reviewed  RAPID STREP SCREEN (NOT AT [facility])  [place], ED    Imaging Review No results found.   EKG Interpretation   Date/Time:  Thursday November 30 2014 07:54:53 EDT Ventricular Rate:  75 PR Interval:    QRS Duration: 179 QT Interval:  478 QTC Calculation: 534 R Axis:   -66 Text Interpretation:  Accelerated junctional rhythm Right bundle branch  block LVH with secondary repolarization abnormality No significant change  since last tracing Confirmed by Maryan Rued  MD, Loree Fee ([id number]) on 11/30/2014  7:57:57 AM      MDM   Final diagnoses:  CHF exacerbation  Anemia of unknown etiology  Chronic anticoagulation  Hypoxia    8:47 AM This is a 69 year old female with multiple medical problems. Her oxygen saturation is low. Patient was placed on 2 L via nasal cannula with immediate correction above 90%. CBC shows a hemoglobin of 7.6. Her last hemoglobin 3 months ago with 10. Chest x-ray shows some mild interstitial edema and tiny pleural effusion. Her EKG is unchanged from previous. Have ordered a type and screen.   10. Given 40 mg IV Lasix. I have discussed the case with hospitalist who will admit the patient for CHF exacerbation and anemia of unknown origin. Her chronic kidney disease, appears at baseline.   Margarita Mail, PA-C 11/30/14 [place], MD 12/01/14 2221

## 2014-11-30 NOTE — Progress Notes (Addendum)
Fever w/o leukocytosis -SPIKED FEVER after arrival -given respiratory sx's could be viral URI- will ck resp viral panel and since has COPD will begin empiric anbxs (discuss appropriate agent with pharmacist since Qtc 534 ms) -could also be UTI so ck'd UA/cx which was unremarkable -ck blood cx's -rpt CBC in am -rapid strep negative  Erin Hearing ANP

## 2014-11-30 NOTE — Consult Note (Signed)
Reason for Consult:CKD Referring Physician: Charlies Silvers, MD  [name] is an 69 y.o. female.  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  In the ED she was noted to have an Hgb of 7.6 (down from 9.2 the month prior) and was admitted for symptomatic anemia and blood transfusion.  We were asked to see the patient to further evaluate and manage her CKD.  She has been evaluated by Heme/Onc in the past for her normocytic anemia which was felt to be due to her underlying CKD and was scheduled to start outpt ESA next week.  Her trend in Scr is seen below.  She was recently informed of her CKD but had little insight into the chronicity and severity.  Trend in Creatinine:  CREATININE, SER  Date/Time Value Ref Range Status  11/30/2014 08:25 AM 2.00* 0.44 - 1.00 mg/dL Final  11/02/2014 10:30 AM 2.32* 0.44 - 1.00 mg/dL Final  10/26/2014 04:56 AM 1.94* 0.44 - 1.00 mg/dL Final  10/25/2014 04:50 AM 1.81* 0.44 - 1.00 mg/dL Final  10/24/2014 04:20 AM 1.61* 0.44 - 1.00 mg/dL Final  10/23/2014 03:44 AM 1.86* 0.44 - 1.00 mg/dL Final  10/22/2014 05:35 AM 1.96* 0.44 - 1.00 mg/dL Final  10/21/2014 04:50 AM 1.76* 0.50 - 1.10 mg/dL Final  10/20/2014 07:06 AM 1.87* 0.50 - 1.10 mg/dL Final  10/19/2014 02:53 AM 2.10* 0.50 - 1.10 mg/dL Final  10/18/2014 03:04 AM 2.46* 0.50 - 1.10 mg/dL Final  10/17/2014 10:00 AM 2.50* 0.50 - 1.10 mg/dL Final  10/17/2014 09:15 AM 2.48* 0.50 - 1.10 mg/dL Final  05/30/2014 10:16 AM 2.21* 0.50 - 1.10 mg/dL Final  04/27/2014 10:45 AM 1.91* 0.50 - 1.10 mg/dL Final  04/18/2014 05:27 AM 2.80* 0.50 - 1.10 mg/dL Final  04/11/2014 12:20 PM 2.30* 0.50 - 1.10 mg/dL Final  01/11/2014 05:45 AM 1.78* 0.50 -  1.10 mg/dL Final  01/10/2014 05:26 AM 1.54* 0.50 - 1.10 mg/dL Final  06/22/2013 05:25 AM 1.18* 0.50 - 1.10 mg/dL Final  06/21/2013 04:00 AM 1.35* 0.50 - 1.10 mg/dL Final  06/20/2013 05:20 AM 1.42* 0.50 - 1.10 mg/dL Final  06/19/2013 08:04 AM 1.34* 0.50 - 1.10 mg/dL Final  06/18/2013 07:51 PM 1.48* 0.50 - 1.10 mg/dL Final  01/20/2013 10:10 AM 1.17* 0.50 - 1.10 mg/dL Final  08/25/2012 09:15 AM 1.56* 0.50 - 1.10 mg/dL Final  08/22/2012 05:09 AM 1.28* 0.50 - 1.10 mg/dL Final  08/21/2012 05:06 AM 1.58* 0.50 - 1.10 mg/dL Final  08/20/2012 04:40 AM 1.50* 0.50 - 1.10 mg/dL Final  08/19/2012 04:55 AM 1.67* 0.50 - 1.10 mg/dL Final  08/18/2012 03:51 PM 1.85* 0.50 - 1.10 mg/dL Final  08/05/2012 05:00 AM 1.29* 0.50 - 1.10 mg/dL Final  08/03/2012 04:30 AM 1.20* 0.50 - 1.10 mg/dL Final  08/02/2012 09:51 PM 1.33* 0.50 - 1.10 mg/dL Final  05/27/2010 1.24 mg/dL   01/02/2010 08:41 PM 1.12 0.40-1.20 mg/dL Final  06/11/2009 07:02 PM 1.34* 0.40-1.20 mg/dL Final  06/11/2009 1.34 mg/dL   03/19/2009 09:56 PM 1.53* 0.40-1.20 mg/dL Final  12/02/2008 1.40 mg/dL   04/11/2008 11:10 AM 1.14  Final  04/09/2008 08:37 PM 1.3*  Final  07/16/2007 12:00 AM 0.99 0.40-1.20 mg/dL Final  04/22/2007 1.13 mg/dL     PMH:  Past Medical History  Diagnosis Date  . Cardiomyopathy, nonischemic     a. 1999 nl cath;  b. 12/05 [employer] [place];  c. 10/2005 ICD extraction 2/2 enterococcus bacteremia and Veg on RV lead;  c. 05/2008 low risk Myoview (scarring w/ some evidence of inf ischemia);  d. 11/2012 Echo: EF 15-20%;  e. 01/2013 s/p MDT [place] CRT D, ser # [id number];  f. 05/2013 Echo: EF 15%.  . Enterococcal infection     a. 10/2005 - AICD-explanted  . Pulmonary embolism     a. 07/2005 after total right hip arthroplasty  . Hilar density     a. infrahilar mass/adenopathy on CT scan 5/07; subsequently  resolved  . LBBB (left bundle branch block)   . GERD (gastroesophageal reflux disease)   . Hyperlipidemia   . Hypertension   .  Tobacco abuse     a. discontinued in 1997, and then resumed  . Urinary incontinence   . Anemia     a. mild/chronic  . Villous adenoma of colon     a. tubovillous adenomatous polyp with focal high grade dysplasia; presented with hematochezia - followed by Dr. Laural Golden.  . Implantable cardioverter-defibrillator-CRT- Mdt     a.  01/2013 s/p MDT [place] CRT D, ser # [id number]  . Chronic systolic CHF (congestive heart failure)     a. 11/2012 Echo: EF 15-20%;  b. 05/2013 TEE EF 15%.  . Pneumonia 07/2005  . Obstructive sleep apnea     a. mild-did not tolerate CPAP (01/24/2013)  . Type II diabetes mellitus   . History of blood transfusion   . Degenerative joint disease     of knees, shoulder, and hips  . PAF (paroxysmal atrial fibrillation)     a. 07/2012 s/p TEE/DCCV;  b. chronic coumadin;  c. 05/2013 Recurrent Afib->TEE/DCCV and amio initiation.  . CKD (chronic kidney disease), stage III     creatinin-1.44 in 1/09; 1.51 in 1/10    PSH:   Past Surgical History  Procedure Laterality Date  . Pacemaker removal  11/18/05    Enterococcal infection  . Total hip arthroplasty Right 07/2005  . Knee arthroscopy Right 1980's?  . Colonoscopy w/ polypectomy  2009  . Cardioversion N/A 08/20/2012    Procedure: TEE GUIDED CARDIOVERSION;  Surgeon: Yehuda Savannah, MD;  Location: [facility];  Service: Cardiovascular;  Laterality: N/A;  To be done @ bedside  . Tee without cardioversion N/A 08/20/2012    Procedure: TRANSESOPHAGEAL ECHOCARDIOGRAM (TEE);  Surgeon: Yehuda Savannah, MD;  Location: [facility];  Service: Cardiovascular;  Laterality: N/A;  . Bi-ventricular implantable cardioverter defibrillator  (crt-d)  01/24/2013  . A-v cardiac pacemaker insertion  12/05    Biventricular pacemaker/AICD  . Abdominal hysterectomy  1990/92    Initial partial hysterectomy followed by BSO  . Tubal ligation  1980's  . Cardiac catheterization    . Colonoscopy with esophagogastroduodenoscopy (egd) N/A 06/10/2013    Procedure:  COLONOSCOPY WITH ESOPHAGOGASTRODUODENOSCOPY (EGD);  Surgeon: Rogene Houston, MD;  Location: [facility];  Service: Endoscopy;  Laterality: N/A;  925  . Tee without cardioversion N/A 06/20/2013    Procedure: TRANSESOPHAGEAL ECHOCARDIOGRAM (TEE);  Surgeon: Dorothy Spark, MD;  Location: [place];  Service: Cardiovascular;  Laterality: N/A;  . Cardioversion N/A 06/20/2013    Procedure: CARDIOVERSION;  Surgeon: Dorothy Spark, MD;  Location: [place];  Service: Cardiovascular;  Laterality: N/A;  . Bi-ventricular implantable cardioverter defibrillator N/A 01/24/2013    Procedure: BI-VENTRICULAR IMPLANTABLE CARDIOVERTER DEFIBRILLATOR  (  CRT-D);  Surgeon: Evans Lance, MD;  Location: [facility] CATH LAB;  Service: Cardiovascular;  Laterality: N/A;  . Right heart catheterization N/A 04/18/2014    Procedure: RIGHT HEART CATH;  Surgeon: Larey Dresser, MD;  Location: [facility] CATH LAB;  Service: Cardiovascular;  Laterality: N/A;  . Esophagogastroduodenoscopy N/A 10/21/2014    Procedure: ESOPHAGOGASTRODUODENOSCOPY (EGD);  Surgeon: Inda Castle, MD;  Location: [place];  Service: Endoscopy;  Laterality: N/A;  . Colonoscopy Left 10/24/2014    Procedure: COLONOSCOPY;  Surgeon: Carol Ada, MD;  Location: [facility] ENDOSCOPY;  Service: Endoscopy;  Laterality: Left;  Freda Munro capsule study N/A 10/24/2014    Procedure: GIVENS CAPSULE STUDY;  Surgeon: Carol Ada, MD;  Location: [facility] ENDOSCOPY;  Service: Endoscopy;  Laterality: N/A;    Allergies: No Known Allergies  Medications:   Prior to Admission medications   Medication Sig Start Date End Date Taking? Authorizing Provider  amiodarone (PACERONE) 200 MG tablet Take 1 tablet (200 mg total) by mouth daily. 02/08/14  Yes Imogene Burn, PA-C  carvedilol (COREG) 3.125 MG tablet Take 3 tablets (9.375 mg total) by mouth 2 (two) times daily with a meal. 10/31/14  Yes Larey Dresser, MD  cetirizine (ZYRTEC) 10 MG tablet Take 10 mg by mouth daily as needed for  allergies.    Yes Historical Provider, MD  colchicine 0.6 MG tablet Take 0.6 mg by mouth 2 (two) times daily.  12/20/13  Yes Historical Provider, MD  digoxin (LANOXIN) 0.125 MG tablet Take 0.5 tablets (0.0625 mg total) by mouth every other day. 10/26/14  Yes Rushil Sherrye Payor, MD  feeding supplement, ENSURE ENLIVE, (ENSURE ENLIVE) LIQD Take 237 mLs by mouth 2 (two) times daily between meals. 10/26/14  Yes Rushil Sherrye Payor, MD  furosemide (LASIX) 40 MG tablet TAKE 1 AND 1/2 TABLETS BY MOUTH TWICE DAILY. 11/22/14  Yes Jolaine Artist, MD  isosorbide mononitrate (IMDUR) 30 MG 24 hr tablet Take 1 tablet (30 mg total) by mouth daily. 10/26/14  Yes Rushil Sherrye Payor, MD  levothyroxine (SYNTHROID, LEVOTHROID) 25 MCG tablet Take 1 tablet (25 mcg total) by mouth daily before breakfast. 10/26/14  Yes Rushil Sherrye Payor, MD  metoCLOPramide (REGLAN) 5 MG tablet Take 5 mg by mouth 4 (four) times daily.   Yes Historical Provider, MD  ondansetron (ZOFRAN) 4 MG tablet Take 4 mg by mouth every 4 (four) hours as needed for nausea or vomiting.   Yes Historical Provider, MD  oxyCODONE-acetaminophen (PERCOCET) 10-325 MG per tablet Take 1 tablet by mouth every 6 (six) hours as needed for pain.  12/09/13  Yes Historical Provider, MD  polyethylene glycol powder (GLYCOLAX/MIRALAX) powder Take 1 Container by mouth daily as needed for mild constipation.  11/14/14  Yes Historical Provider, MD  pravastatin (PRAVACHOL) 40 MG tablet Take 80 mg by mouth at bedtime.    Yes Historical Provider, MD  warfarin (COUMADIN) 2.5 MG tablet Take 0.5 tablets (1.25 mg total) by mouth daily at 6 PM. 10/26/14  Yes Rushil Sherrye Payor, MD  furosemide (LASIX) 40 MG tablet TAKE 1 AND 1/2 TABLETS BY MOUTH TWICE DAILY. Patient not taking: Reported on 11/30/2014 11/21/14   Jolaine Artist, MD  ivabradine (CORLANOR) 5 MG TABS tablet Take 0.5 tablets (2.5 mg total) by mouth 2 (two) times daily with a meal. Patient not taking: Reported on 11/30/2014 07/05/14   Jolaine Artist, MD     Inpatient medications: . furosemide  40 mg Intravenous Once  . insulin aspart  0-15 Units  Subcutaneous TID WC  . insulin aspart  0-5 Units Subcutaneous QHS    Discontinued Meds:  There are no discontinued medications.  Social History:  reports that she has quit smoking. Her smoking use included Cigarettes. She quit after 12 years of use. She has never used smokeless tobacco. She reports that she does not drink alcohol or use illicit drugs.  Family History:   Family History  Problem Relation Age of Onset  . Hypertension Mother   . Diabetes Mother   . Coronary artery disease Father   . Diabetes Brother   . Hypertension Brother   . Lung cancer Brother   . Arthritis Other   . Diabetes Other   . Heart disease Other     female < 55    A comprehensive review of systems was negative except for: Constitutional: positive for chills, fatigue and malaise Respiratory: positive for dyspnea on exertion Weight change:  No intake or output data in the 24 hours ending 11/30/14 1212 BP 120/54 mmHg  Pulse 70  Temp(Src) 99.6 F (37.6 C) (Oral)  Resp 16  Ht 5\' 5"  (1.651 m)  Wt 69.491 kg (153 lb 3.2 oz)  BMI 25.49 kg/m2  SpO2 98% Filed Vitals:   11/30/14 0945 11/30/14 1000 11/30/14 1015 11/30/14 1200  BP: 111/56 121/54 111/50 120/54  Pulse: 70 70 70 70  Temp:      TempSrc:      Resp: 18   16  Height:      Weight:      SpO2: 99% 99% 99% 98%     General appearance: fatigued, no distress and slowed mentation Head: Normocephalic, without obvious abnormality, atraumatic Eyes: negative findings: lids and lashes normal, conjunctivae and sclerae normal and corneas clear Neck: no adenopathy, no carotid bruit, no JVD, supple, symmetrical, trachea midline and thyroid not enlarged, symmetric, no tenderness/mass/nodules Resp: clear to auscultation bilaterally Cardio: irregularly irregular rhythm and no rub GI: normal findings: bowel sounds normal and soft and abnormal findings:  tenderness to  deep palpation without rebound/guarding Extremities: extremities normal, atraumatic, no cyanosis or edema  Labs: Basic Metabolic Panel:  Recent Labs Lab 11/30/14 0825  NA 133*  K 3.7  CL 98*  CO2 22  GLUCOSE 271*  BUN 33*  CREATININE 2.00*  CALCIUM 8.5*   Liver Function Tests: No results for input(s): AST, ALT, ALKPHOS, BILITOT, PROT, ALBUMIN in the last 168 hours. No results for input(s): LIPASE, AMYLASE in the last 168 hours. No results for input(s): AMMONIA in the last 168 hours. CBC:  Recent Labs Lab 11/30/14 0825 11/30/14 1115  WBC 9.5  --   HGB 7.6* 8.2*  HCT 22.6* 24.7*  MCV 91.9  --   PLT 156  --    PT/INR: @LABRCNTIP (inr:5) Cardiac Enzymes: )No results for input(s): CKTOTAL, CKMB, CKMBINDEX, TROPONINI in the last 168 hours. CBG: No results for input(s): GLUCAP in the last 168 hours.  Iron Studies: No results for input(s): IRON, TIBC, TRANSFERRIN, FERRITIN in the last 168 hours.  Xrays/Other Studies: Dg Chest 2 View  11/30/2014   CLINICAL DATA:  Shortness of breath.  EXAM: CHEST  2 VIEW  COMPARISON:  10/17/2014.  01/10/2014.  FINDINGS: Mediastinum hilar structures are normal. Cardiac pacer with lead tips in right atrium right ventricle. Cardiomegaly. Mild bilateral interstitial prominence with Kerley B-lines noted. These findings are most consistent mild congestive heart failure. Tiny left pleural effusion cannot be excluded. No pneumothorax. Degenerative changes both shoulders.  IMPRESSION: Findings consistent with mild congestive heart  failure with pulmonary interstitial edema and tiny left pleural effusion. Cardiac pacer noted in stable position.   Electronically Signed   By: Marcello Moores  Register   On: 11/30/2014 08:42     Assessment/Plan: 1.  CKD stage 3-4- has been progressive over the last several years presumably due to hypertensive nephrosclerosis further complicated by episodes of CHF and Cardiorenal syndrome.  She did have some free light chains but no M  spike in April (seen by Dr. Beryle Beams) and had some asymmetry of renal size on [place] last year suggesting renovascular disease so will repeat renal US.  Currently with bland urine sediment so no serologies at this time.  We discussed the ways to delay the progression of CKD with: 1. Tight BP control with goal BP <130/80 2. Maximize cardiac status and limit Na to 2 grams/day and fluid to 1255ml/day (to avoid decompensated CHF and cardiorenal syndrome) 3. Avoid nephrotoxic agents such as NSAIDs/COX-II I's and IV contrast 4. Renal dose medication.  2. Symptomatic Anemia of chronic kidney disease- has already been seen in [place] and was set up for outpt ESA therapy.  Follow iron stores but will defer to Oncology who is following her. 1. Agree with blood transfusion (over 4 hours and followed by IV lasix given CKD and CHF) 3. Fever- agree with work up per primary team 4. CHF- both systolic and diastolic, appears euvolemic at this time.  Cont to limit fluid and sodium per Cards. 5. A fib- on coumadin per primary svc  On amiodarone per Cards. 6. HTN- currently at goal.  Would not recommend ACE/ARB given advanced CKD and multiple episodes of decompensated CHF with AKI/CKD. 7. Hyperlipidemia- on statin per cards.   Justyna Timoney A 11/30/2014, 12:12 PM

## 2014-12-01 DIAGNOSIS — I5043 Acute on chronic combined systolic (congestive) and diastolic (congestive) heart failure: Secondary | ICD-10-CM

## 2014-12-01 LAB — TYPE AND SCREEN
ABO/RH(D): A NEG
ANTIBODY SCREEN: NEGATIVE
Unit division: 0

## 2014-12-01 LAB — URINE CULTURE

## 2014-12-01 LAB — RENAL FUNCTION PANEL
Albumin: 2.9 g/dL — ABNORMAL LOW (ref 3.5–5.0)
Anion gap: 10 (ref 5–15)
BUN: 29 mg/dL — ABNORMAL HIGH (ref 6–20)
CO2: 27 mmol/L (ref 22–32)
Calcium: 9.1 mg/dL (ref 8.9–10.3)
Chloride: 101 mmol/L (ref 101–111)
Creatinine, Ser: 1.92 mg/dL — ABNORMAL HIGH (ref 0.44–1.00)
GFR calc non Af Amer: 25 mL/min — ABNORMAL LOW (ref >60)
GFR, EST AFRICAN AMERICAN: 29 mL/min — AB (ref >60)
Glucose, Bld: 137 mg/dL — ABNORMAL HIGH (ref 65–99)
Phosphorus: 2.8 mg/dL (ref 2.5–4.6)
Potassium: 3.4 mmol/L — ABNORMAL LOW (ref 3.5–5.1)
SODIUM: 138 mmol/L (ref 135–145)

## 2014-12-01 LAB — GLUCOSE, CAPILLARY
GLUCOSE-CAPILLARY: 148 mg/dL — AB (ref 65–99)
GLUCOSE-CAPILLARY: 147 mg/dL — AB (ref 65–99)
Glucose-Capillary: 149 mg/dL — ABNORMAL HIGH (ref 65–99)
Glucose-Capillary: 135 mg/dL — ABNORMAL HIGH (ref 65–99)

## 2014-12-01 LAB — CBC
HEMATOCRIT: 26.6 % — AB (ref 36.0–46.0)
Hemoglobin: 9 g/dL — ABNORMAL LOW (ref 12.0–15.0)
MCH: 30.9 pg (ref 26.0–34.0)
MCHC: 33.8 g/dL (ref 30.0–36.0)
MCV: 91.4 fL (ref 78.0–100.0)
PLATELETS: 139 10*3/uL — AB (ref 150–400)
RBC: 2.91 MIL/uL — ABNORMAL LOW (ref 3.87–5.11)
RDW: 15.5 % (ref 11.5–15.5)
WBC: 4.8 10*3/uL (ref 4.0–10.5)

## 2014-12-01 LAB — ERYTHROPOIETIN: ERYTHROPOIETIN: 16.2 m[IU]/mL (ref 2.6–18.5)

## 2014-12-01 LAB — HEMOGLOBIN A1C
Hgb A1c MFr Bld: 7.3 % — ABNORMAL HIGH (ref 4.8–5.6)
MEAN PLASMA GLUCOSE: 163 mg/dL

## 2014-12-01 LAB — OCCULT BLOOD X 1 CARD TO LAB, STOOL: Fecal Occult Bld: NEGATIVE

## 2014-12-01 MED ORDER — POTASSIUM CHLORIDE CRYS ER 20 MEQ PO TBCR
20.0000 meq | EXTENDED_RELEASE_TABLET | Freq: Once | ORAL | Status: AC
  Administered 2014-12-01: 20 meq via ORAL
  Filled 2014-12-01: qty 1

## 2014-12-01 MED ORDER — FOLIC ACID 1 MG PO TABS
1.0000 mg | ORAL_TABLET | Freq: Every day | ORAL | Status: DC
  Administered 2014-12-02: 1 mg via ORAL
  Filled 2014-12-01: qty 1

## 2014-12-01 MED ORDER — VITAMIN B-1 100 MG PO TABS
100.0000 mg | ORAL_TABLET | Freq: Every day | ORAL | Status: DC
  Administered 2014-12-02: 100 mg via ORAL
  Filled 2014-12-01: qty 1

## 2014-12-01 MED ORDER — SODIUM CHLORIDE 0.9 % IV SOLN
510.0000 mg | Freq: Once | INTRAVENOUS | Status: AC
  Administered 2014-12-01: 510 mg via INTRAVENOUS
  Filled 2014-12-01: qty 17

## 2014-12-01 MED ORDER — WARFARIN - PHARMACIST DOSING INPATIENT
Freq: Every day | Status: DC
  Administered 2014-12-01: 18:00:00

## 2014-12-01 MED ORDER — WARFARIN 1.25 MG HALF TABLET
1.2500 mg | ORAL_TABLET | Freq: Once | ORAL | Status: AC
  Administered 2014-12-01: 1.25 mg via ORAL
  Filled 2014-12-01: qty 1

## 2014-12-01 MED ORDER — DARBEPOETIN ALFA 100 MCG/0.5ML IJ SOSY
100.0000 ug | PREFILLED_SYRINGE | Freq: Once | INTRAMUSCULAR | Status: AC
  Administered 2014-12-01: 100 ug via SUBCUTANEOUS
  Filled 2014-12-01: qty 0.5

## 2014-12-01 NOTE — Progress Notes (Signed)
Advanced Heart Failure Rounding Note   Subjective:    69 yo with history of chronic systolic CHF from nonischemic cardiomyopathy EF 15-20% s/p CRT-D, DM, prior PE, CKD (Cr 1.9-2.0), and paroxysmal atrial fibrillation presents for CHF clinic evaluation.Admitted with progressive dyspnea and symptomatic anemia hgb 7.6. Weight also up 5 pounds and she got IV lasix.   Denies dyspnea. Lying flat. No CP.    Objective:   Weight Range:  Vital Signs:   Temp:  [98.4 F (36.9 C)-98.7 F (37.1 C)] 98.7 F (37.1 C) (06/10 1344) Pulse Rate:  [70-75] 70 (06/10 1344) Resp:  [16-18] 18 (06/10 1344) BP: (108-122)/(56-62) 122/59 mmHg (06/10 1344) SpO2:  [96 %-100 %] 96 % (06/10 1344) Weight:  [69 kg (152 lb 1.9 oz)] 69 kg (152 lb 1.9 oz) (06/10 0500) Last BM Date: 12/01/14  Weight change: Filed Weights   11/30/14 0800 11/30/14 1231 12/01/14 0500  Weight: 69.491 kg (153 lb 3.2 oz) 69 kg (152 lb 1.9 oz) 69 kg (152 lb 1.9 oz)    Intake/Output:   Intake/Output Summary (Last 24 hours) at 12/01/14 1537 Last data filed at 12/01/14 1348  Gross per 24 hour  Intake 1227.5 ml  Output   1300 ml  Net  -72.5 ml     Physical Exam: General:  elderly No resp difficulty HEENT: normal Neck: supple. JVP 6 . Carotids 2+ bilat; no bruits. No lymphadenopathy or thryomegaly appreciated. Cor: PMI nondisplaced. Regular rate & rhythm. No rubs, gallops or murmurs. Lungs: clear Abdomen: soft, nontender, nondistended. No hepatosplenomegaly. No bruits or masses. Good bowel sounds. Extremities: no cyanosis, clubbing, rash, edema Neuro: alert & orientedx3, cranial nerves grossly intact. moves all 4 extremities w/o difficulty. Affect pleasant  Telemetry: NSR  Labs: Basic Metabolic Panel:  Recent Labs Lab 11/30/14 0825 12/01/14 0502  NA 133* 138  K 3.7 3.4*  CL 98* 101  CO2 22 27  GLUCOSE 271* 137*  BUN 33* 29*  CREATININE 2.00* 1.92*  CALCIUM 8.5* 9.1  PHOS  --  2.8    Liver Function  Tests:  Recent Labs Lab 12/01/14 0502  ALBUMIN 2.9*   No results for input(s): LIPASE, AMYLASE in the last 168 hours. No results for input(s): AMMONIA in the last 168 hours.  CBC:  Recent Labs Lab 11/30/14 0825 11/30/14 1115 11/30/14 1917 12/01/14 0502  WBC 9.5  --  6.6 4.8  HGB 7.6* 8.2* 9.2* 9.0*  HCT 22.6* 24.7* 27.3* 26.6*  MCV 91.9  --  91.6 91.4  PLT 156  --  144* 139*    Cardiac Enzymes: No results for input(s): CKTOTAL, CKMB, CKMBINDEX, TROPONINI in the last 168 hours.  BNP: BNP (last 3 results)  Recent Labs  10/17/14 1909 11/30/14 0825  BNP 488.1* 1322.3*    ProBNP (last 3 results)  Recent Labs  04/11/14 1220 04/27/14 1045 05/30/14 1016  PROBNP 8376.0* 14381.0* 6690.0*      Other results:  Imaging: Dg Chest 2 View  11/30/2014   CLINICAL DATA:  Shortness of breath.  EXAM: CHEST  2 VIEW  COMPARISON:  10/17/2014.  01/10/2014.  FINDINGS: Mediastinum hilar structures are normal. Cardiac pacer with lead tips in right atrium right ventricle. Cardiomegaly. Mild bilateral interstitial prominence with Kerley B-lines noted. These findings are most consistent mild congestive heart failure. Tiny left pleural effusion cannot be excluded. No pneumothorax. Degenerative changes both shoulders.  IMPRESSION: Findings consistent with mild congestive heart failure with pulmonary interstitial edema and tiny left pleural effusion. Cardiac pacer noted in  stable position.   Electronically Signed   By: Marcello Moores  Register   On: 11/30/2014 08:42   US Renal  11/30/2014   CLINICAL DATA:  Chronic kidney disease stage 4  EXAM: RENAL / URINARY TRACT ULTRASOUND COMPLETE  COMPARISON:  01/17/2014  FINDINGS: Right Kidney:  Length: 9.6 cm. Extrarenal pelvis on the right similar to prior study. 12 mm cyst midpole. Increased renal echogenicity.  Left Kidney:  Length: 11.1 cm. 12 mm upper pole cyst. Increased echogenicity of the renal cortex.  Bladder:  Appears normal for degree of bladder  distention.  IMPRESSION: Medical renal disease.   Electronically Signed   By: Skipper Cliche M.D.   On: 11/30/2014 17:22      Medications:     Scheduled Medications: . amiodarone  200 mg Oral Daily  . carvedilol  9.375 mg Oral BID WC  . Chlorhexidine Gluconate Cloth  6 each Topical Q0600  . digoxin  0.0625 mg Oral QODAY  . feeding supplement (ENSURE ENLIVE)  237 mL Oral BID BM  . [START ON 7/74/1287] folic acid  1 mg Oral Daily  . furosemide  60 mg Oral BID  . insulin aspart  0-15 Units Subcutaneous TID WC  . insulin aspart  0-5 Units Subcutaneous QHS  . isosorbide mononitrate  30 mg Oral Daily  . levothyroxine  25 mcg Oral QAC breakfast  . loratadine  10 mg Oral Daily  . mupirocin ointment  1 application Nasal BID  . [START ON 12/02/2014] thiamine  100 mg Oral Daily  . warfarin  1.25 mg Oral ONCE-1800  . Warfarin - Pharmacist Dosing Inpatient   Does not apply q1800     Infusions:     PRN Medications:  acetaminophen **OR** acetaminophen, alum & mag hydroxide-simeth, ondansetron **OR** ondansetron (ZOFRAN) IV, polyethylene glycol powder   Assessment:   1. Acute on chronic systolic HF 2. Symptomatic Anemia 3, CKD, stage III-IV 4. PAF 5. Hypokalemia  Plan/Discussion:    Volume status currently looks good. Renal function is stable. Back on home dose lasix. Likely can go home from our standpoint. Continue warfarin.  Would check digoxin level in am. We will sign off. Please call with questions.     Length of Stay: 1   Bensimhon, Daniel MD 12/01/2014, 3:37 PM  Advanced Heart Failure Team Pager [phone] (M-F; [place])  Please contact [facility] for night-coverage after hours (4p -7a ) and weekends on [email or web address]

## 2014-12-01 NOTE — Evaluation (Signed)
Physical Therapy Evaluation Patient Details Name: GISSELLA NIBLACK MRN: [id number] DOB: 04-30-1946 Today's Date: 12/01/2014   History of Present Illness  Pt adm with chronic anemia. PMH - CKD, DM, ICD, afib  Clinical Impression  Pt doing well with mobility and no further PT needed.  Ready for dc from PT standpoint.      Follow Up Recommendations No PT follow up    Equipment Recommendations  None recommended by PT    Recommendations for Other Services       Precautions / Restrictions Precautions Precautions: Fall      Mobility  Bed Mobility Overal bed mobility: Needs Assistance Bed Mobility: Supine to Sit;Sit to Supine     Supine to sit: Min assist Sit to supine: Min assist   General bed mobility comments: Assist to bring trunk up into sitting and assist to bring legs back up into bed when returning to supine  Transfers Overall transfer level: Needs assistance Equipment used: Rolling walker (2 wheeled) Transfers: Sit to/from Stand Sit to Stand: Supervision            Ambulation/Gait Ambulation/Gait assistance: Supervision Ambulation Distance (Feet): 200 Feet Assistive device: Rolling walker (2 wheeled) Gait Pattern/deviations: Step-through pattern;Trendelenburg   Gait velocity interpretation: Below normal speed for age/gender General Gait Details: No loss of balance  Stairs            Wheelchair Mobility    Modified Rankin (Stroke Patients Only)       Balance Overall balance assessment: Needs assistance Sitting-balance support: No upper extremity supported;Feet supported Sitting balance-Leahy Scale: Good     Standing balance support: No upper extremity supported Standing balance-Leahy Scale: Fair                               Pertinent Vitals/Pain Pain Assessment: No/denies pain    Home Living Family/patient expects to be discharged to:: Private residence Living Arrangements: Spouse/significant other Available Help at  Discharge: Family;Available 24 hours/day Type of Home: House Home Access: Stairs to enter Entrance Stairs-Rails: Psychiatric nurse of Steps: 8 Home Layout: One level Home Equipment: Walker - 4 wheels;Cane - single point;Shower seat;Wheelchair - Press photographer;Bedside commode      Prior Function Level of Independence: Independent with assistive device(s)         Comments: amb with use of walker at times     Hand Dominance   Dominant Hand: Right    Extremity/Trunk Assessment   Upper Extremity Assessment: Defer to OT evaluation           Lower Extremity Assessment: Overall WFL for tasks assessed         Communication   Communication: No difficulties  Cognition Arousal/Alertness: Awake/alert Behavior During Therapy: WFL for tasks assessed/performed Overall Cognitive Status: Within Functional Limits for tasks assessed                      General Comments      Exercises        Assessment/Plan    PT Assessment Patent does not need any further PT services  PT Diagnosis Difficulty walking   PT Problem List    PT Treatment Interventions     PT Goals (Current goals can be found in the Care Plan section) Acute Rehab PT Goals PT Goal Formulation: All assessment and education complete, DC therapy    Frequency     Barriers to discharge  Co-evaluation               End of Session Equipment Utilized During Treatment: Gait belt Activity Tolerance: Patient tolerated treatment well Patient left: in bed;with call bell/phone within reach;with family/visitor present           Time: 1150-1210 PT Time Calculation (min) (ACUTE ONLY): 20 min   Charges:   PT Evaluation $Initial PT Evaluation Tier I: 1 Procedure     PT G Codes:        Gabrella Stroh 22-Dec-2014, 1:47 PM  [facility] PT [phone]

## 2014-12-01 NOTE — Progress Notes (Signed)
ANTICOAGULATION CONSULT NOTE - Initial Consult  Pharmacy Consult for warfarin Indication: atrial fibrillation  No Known Allergies  Patient Measurements: Height: 5\' 5"  (165.1 cm) Weight: 152 lb 1.9 oz (69 kg) IBW/kg (Calculated) : 57  Vital Signs: Temp: 98.7 F (37.1 C) (06/10 1344) Temp Source: Oral (06/10 1344) BP: 122/59 mmHg (06/10 1344) Pulse Rate: 70 (06/10 1344)  Labs:  Recent Labs  11/30/14 0825 11/30/14 1115 11/30/14 1917 12/01/14 0502  HGB 7.6* 8.2* 9.2* 9.0*  HCT 22.6* 24.7* 27.3* 26.6*  PLT 156  --  144* 139*  LABPROT 18.2*  --   --   --   INR 1.51*  --   --   --   CREATININE 2.00*  --   --  1.92*    Estimated Creatinine Clearance: 26.6 mL/min (by C-G formula based on Cr of 1.92).   Medical History: Past Medical History  Diagnosis Date  . Cardiomyopathy, nonischemic     a. 1999 nl cath;  b. 12/05 [employer];  c. 10/2005 ICD extraction 2/2 enterococcus bacteremia and Veg on RV lead;  c. 05/2008 low risk Myoview (scarring w/ some evidence of inf ischemia);  d. 11/2012 Echo: EF 15-20%;  e. 01/2013 s/p MDT [place] CRT D, ser # [id number];  f. 05/2013 Echo: EF 15%.  . Enterococcal infection     a. 10/2005 - AICD-explanted  . Pulmonary embolism     a. 07/2005 after total right hip arthroplasty  . Hilar density     a. infrahilar mass/adenopathy on CT scan 5/07; subsequently  resolved  . LBBB (left bundle branch block)   . GERD (gastroesophageal reflux disease)   . Hyperlipidemia   . Hypertension   . Tobacco abuse     a. discontinued in 1997, and then resumed  . Urinary incontinence   . Anemia     a. mild/chronic  . Villous adenoma of colon     a. tubovillous adenomatous polyp with focal high grade dysplasia; presented with hematochezia - followed by Dr. Laural Golden.  . Implantable cardioverter-defibrillator-CRT- Mdt     a.  01/2013 s/p MDT [place] CRT D, ser # [id number]  . Chronic systolic CHF (congestive heart failure)     a. 11/2012 Echo: EF 15-20%;  b.  05/2013 TEE EF 15%.  . Pneumonia 07/2005  . Obstructive sleep apnea     a. mild-did not tolerate CPAP (01/24/2013)  . History of blood transfusion   . Degenerative joint disease     of knees, shoulder, and hips  . PAF (paroxysmal atrial fibrillation)     a. 07/2012 s/p TEE/DCCV;  b. chronic coumadin;  c. 05/2013 Recurrent Afib->TEE/DCCV and amio initiation.  . CKD (chronic kidney disease), stage III     creatinin-1.44 in 1/09; 1.51 in 1/10  . Type II diabetes mellitus     type 2    Medications:  Scheduled:  . amiodarone  200 mg Oral Daily  . carvedilol  9.375 mg Oral BID WC  . Chlorhexidine Gluconate Cloth  6 each Topical Q0600  . digoxin  0.0625 mg Oral QODAY  . feeding supplement (ENSURE ENLIVE)  237 mL Oral BID BM  . [START ON 5/00/9381] folic acid  1 mg Oral Daily  . furosemide  60 mg Oral BID  . insulin aspart  0-15 Units Subcutaneous TID WC  . insulin aspart  0-5 Units Subcutaneous QHS  . isosorbide mononitrate  30 mg Oral Daily  . levothyroxine  25 mcg Oral QAC breakfast  .  loratadine  10 mg Oral Daily  . mupirocin ointment  1 application Nasal BID  . [START ON 12/02/2014] thiamine  100 mg Oral Daily  . warfarin  1.25 mg Oral ONCE-1800  . Warfarin - Pharmacist Dosing Inpatient   Does not apply q1800    Assessment: CC: 69 year old female presenting with worsening SOB and fatigure. Hgb of 7.6 and INR of 1.51 on admission  PMH: HF (EF 15-20%), s/p ICD, CKD IV, Afib on warfarin, hx of PE, DM, chronic anemia  AC: warfarin for a fib - initially held due to concern for bleeding secondary to anemia. Restart of 6/10 with new goal of 2-2.5. INR 1.51 on 6/9  PTA warfarin 1.25 mg daily except 2.5 mg on Sun (last dose on 6/8)  Goal of Therapy:  INR 2 to 2.5 Monitor platelets by anticoagulation protocol: Yes   Plan:  -Warfarin 1.25 mg x 1 -Daily INR -Monitor closely for s/sx of bleeding  Levester Fresh, PharmD, BCPS Clinical Pharmacist Pager [phone] 12/01/2014 2:42  PM

## 2014-12-01 NOTE — Progress Notes (Signed)
PROGRESS NOTE  [name] XNA:[id number] DOB: 1946/05/18 DOA: 11/30/2014 PCP: Robert Bellow, MD  Brief history  69 year old female with a history of systolic and diastolic CHF with EF 54-27% with ICD implantation, CKD stage IV, atrial fibrillation on anticoagulation, history of PE, diabetes mellitus, COPD, anemia of CKD presented with 2-3 day history of fatigue, dyspnea on exertion. The patient has had an extensive GI workup including EGD, colonoscopy, and capsule endoscopy which have been largely unremarkable without evident source of bleed. She was evaluated by hematology in May 2016 who felt the patient may have anemia of CKD although a smoldering low-grade myelodysplastic syndrome cannot be ruled out. The patient presented to the emergency department and was found to have hemoglobin 7.6. The patient was discharged in May 2016 with hemoglobin of 10.2.  Assessment/Plan: Chronic blood loss anemia  -This is multifactorial including anemia CKD, iron deficiency -Continue to check stool occult blood although her initial test was negative during this admission -Initial hemoglobin was negative on 11/30/2014 -On 12/01/2014 I have personally performed a stool Hemoccult--negative -The patient has not yet received ESA although this was set up in [place] -Patient has been transfused 1 unit PRBC -Serum B12 433 -Transfuse Aranesp 100 mcg, but ultimately the patient will need to be set up with outpatient hematology for routine Epo injections -Case was discussed with nephrology who recommends Procrit 20,000 units every 2-3 weeks -12/01/14--Case was discussed with patient's PCP, Dr. Karie Kirks -According to the patient's husband at the bedside, the patient has an appointment on Tuesday, 12/05/2014 with nephrology in [facility] to receive her erythropoietin injection -Transfuse Feraheme -Patient has not been compliant with iron supplementation in the outpatient  setting -10/21/2014 EGD--essentially normal except for GE junction stricture -10/24/2014 colonoscopy showed polyps and diverticula -10/25/2014 capsule endoscopy--lymphangiectasis with debris in small bowel Acute respiratory failure with hypoxia -Secondary to decompensated CHF -Currently stable on room air presently Acute on chronic systolic and diastolic CHF -appreciate cardiology -10/21/2014 echocardiogram shows EF 15-20% with grade 2 diastolic dysfunction -Daily weights  -Continue intravenous furosemide 60mg  IV bid -Unable to give ACE inhibitor secondary to CKD -continue coreg, imdur -Patient dry weight to be around 148 pounds Fever -UA without pyuria -f/u blood culture -CXR without infiltrate -await respiratory panel -As the patient is hemodynamically stable without any leukocytosis--discontinue all antibiotics and observe  Paroxysmal Afib -continue amiodarone -continue digoxin -CHADVASC= 7 -Restart warfarin as the patient has had 2 Hemoccult stools that have been negative  -when resume recommend asking pharmacy to manage for goal INR 2-2.5 CKD stage 4 -appreciate renal consult -baseline creatinine ranges 1.6-2.0  HTN (hypertension) -Current blood pressure well controlled with current above medical regimen Hypothyroidism -cont Synthroid  COPD (chronic obstructive pulmonary disease)/ Tobacco abuse, in remission -Stable without evidence of wheezing   History of pulmonary embolism -On chronic anticoagulation for atrial fibrillation    Family Communication:   Husband updated at beside Disposition Plan:   Home 1 to 2 days       Procedures/Studies: Dg Chest 2 View  11/30/2014   CLINICAL DATA:  Shortness of breath.  EXAM: CHEST  2 VIEW  COMPARISON:  10/17/2014.  01/10/2014.  FINDINGS: Mediastinum hilar structures are normal. Cardiac pacer with lead tips in right atrium right ventricle. Cardiomegaly. Mild bilateral interstitial prominence with Kerley B-lines noted.  These findings are most consistent mild congestive heart failure. Tiny left pleural effusion cannot be excluded. No pneumothorax. Degenerative changes both shoulders.  IMPRESSION: Findings consistent with mild congestive heart failure with pulmonary interstitial edema and tiny left pleural effusion. Cardiac pacer noted in stable position.   Electronically Signed   By: Marcello Moores  Register   On: 11/30/2014 08:42   US Renal  11/30/2014   CLINICAL DATA:  Chronic kidney disease stage 4  EXAM: RENAL / URINARY TRACT ULTRASOUND COMPLETE  COMPARISON:  01/17/2014  FINDINGS: Right Kidney:  Length: 9.6 cm. Extrarenal pelvis on the right similar to prior study. 12 mm cyst midpole. Increased renal echogenicity.  Left Kidney:  Length: 11.1 cm. 12 mm upper pole cyst. Increased echogenicity of the renal cortex.  Bladder:  Appears normal for degree of bladder distention.  IMPRESSION: Medical renal disease.   Electronically Signed   By: Skipper Cliche M.D.   On: 11/30/2014 17:22         Subjective: Patient denies fevers, chills, headache, chest pain, dyspnea, nausea, vomiting, diarrhea, abdominal pain, dysuria, hematuria   Objective: Filed Vitals:   11/30/14 1611 11/30/14 1822 11/30/14 2100 12/01/14 0500  BP: 108/56  121/62 110/60  Pulse: 70 75 73 73  Temp: 98.7 F (37.1 C)  98.6 F (37 C) 98.4 F (36.9 C)  TempSrc: Oral  Oral Oral  Resp: 16  18 18   Height:      Weight:    69 kg (152 lb 1.9 oz)  SpO2: 100% 99% 98% 99%    Intake/Output Summary (Last 24 hours) at 12/01/14 0855 Last data filed at 12/01/14 0700  Gross per 24 hour  Intake  877.5 ml  Output   1600 ml  Net -722.5 ml   Weight change:  Exam:   General:  Pt is alert, follows commands appropriately, not in acute distress  HEENT: No icterus, No thrush, [place]/AT  Cardiovascular: RRR, S1/S2, no rubs, no gallops  Respiratory: CTA bilaterally, no wheezing, no crackles, no rhonchi  Abdomen: Soft/+BS, non tender, non distended, no  guarding  Extremities: No edema, No lymphangitis, No petechiae, No rashes, no synovitis  Data Reviewed: Basic Metabolic Panel:  Recent Labs Lab 11/30/14 0825 12/01/14 0502  NA 133* 138  K 3.7 3.4*  CL 98* 101  CO2 22 27  GLUCOSE 271* 137*  BUN 33* 29*  CREATININE 2.00* 1.92*  CALCIUM 8.5* 9.1  PHOS  --  2.8   Liver Function Tests:  Recent Labs Lab 12/01/14 0502  ALBUMIN 2.9*   No results for input(s): LIPASE, AMYLASE in the last 168 hours. No results for input(s): AMMONIA in the last 168 hours. CBC:  Recent Labs Lab 11/30/14 0825 11/30/14 1115 11/30/14 1917 12/01/14 0502  WBC 9.5  --  6.6 4.8  HGB 7.6* 8.2* 9.2* 9.0*  HCT 22.6* 24.7* 27.3* 26.6*  MCV 91.9  --  91.6 91.4  PLT 156  --  144* 139*   Cardiac Enzymes: No results for input(s): CKTOTAL, CKMB, CKMBINDEX, TROPONINI in the last 168 hours. BNP: Invalid input(s): POCBNP CBG:  Recent Labs Lab 11/30/14 1317 11/30/14 1738 11/30/14 2222 12/01/14 0744  GLUCAP 231* 128* 118* 149*    Recent Results (from the past 240 hour(s))  Rapid strep screen   (If patient has fever and/or without cough or runny nose)     Status: None   Collection Time: 11/30/14  8:05 AM  Result Value Ref Range Status   Streptococcus, Group A Screen (Direct) NEGATIVE NEGATIVE Final    Comment: (NOTE) A Rapid Antigen test may result negative if the antigen level in the sample is below the  detection level of this test. The FDA has not cleared this test as a stand-alone test therefore the rapid antigen negative result has reflexed to a Group A Strep culture.   MRSA PCR Screening     Status: Abnormal   Collection Time: 11/30/14  1:19 PM  Result Value Ref Range Status   MRSA by PCR POSITIVE (A) NEGATIVE Final    Comment:        The GeneXpert MRSA Assay (FDA approved for NASAL specimens only), is one component of a comprehensive MRSA colonization surveillance program. It is not intended to diagnose MRSA infection nor to guide  or monitor treatment for MRSA infections. RESULT CALLED TO, READ BACK BY AND VERIFIED WITH: BOWIE,C RN @ 2707 11/30/14 LEONARDA,      Scheduled Meds: . amiodarone  200 mg Oral Daily  . carvedilol  9.375 mg Oral BID WC  . cefTRIAXone (ROCEPHIN)  IV  1 g Intravenous Q24H  . Chlorhexidine Gluconate Cloth  6 each Topical Q0600  . digoxin  0.0625 mg Oral QODAY  . doxycycline  100 mg Oral Q12H  . feeding supplement (ENSURE ENLIVE)  237 mL Oral BID BM  . folic acid  1 mg Intravenous Daily  . furosemide  60 mg Oral BID  . insulin aspart  0-15 Units Subcutaneous TID WC  . insulin aspart  0-5 Units Subcutaneous QHS  . isosorbide mononitrate  30 mg Oral Daily  . levothyroxine  25 mcg Oral QAC breakfast  . loratadine  10 mg Oral Daily  . mupirocin ointment  1 application Nasal BID  . thiamine  100 mg Intravenous Daily   Continuous Infusions:    Jetta Murray, DO  [facility] Pager [phone]  If 7PM-7AM, please contact night-coverage [email or web address] Password [email or web address] 12/01/2014, 8:55 AM   LOS: 1 day

## 2014-12-01 NOTE — Discharge Instructions (Signed)

## 2014-12-01 NOTE — Progress Notes (Signed)
S: Feels well, Up in room walking.  Eating. No SOB O:BP 110/60 mmHg  Pulse 73  Temp(Src) 98.4 F (36.9 C) (Oral)  Resp 18  Ht 5\' 5"  (1.651 m)  Wt 69 kg (152 lb 1.9 oz)  BMI 25.31 kg/m2  SpO2 99%  Intake/Output Summary (Last 24 hours) at 12/01/14 0918 Last data filed at 12/01/14 0700  Gross per 24 hour  Intake  877.5 ml  Output   1600 ml  Net -722.5 ml   Weight change:  PYP:PJKDT and alert CVS: RRR (paced) Resp: clear Abd: + BS NTND  No abd bruits Ext: no edema NEURO: CNI, Ox3 no asterixis   Renal US shows increased echogenicity with Rt kidney being smaller than Lt . amiodarone  200 mg Oral Daily  . carvedilol  9.375 mg Oral BID WC  . cefTRIAXone (ROCEPHIN)  IV  1 g Intravenous Q24H  . Chlorhexidine Gluconate Cloth  6 each Topical Q0600  . darbepoetin (ARANESP) injection - NON-DIALYSIS  100 mcg Subcutaneous Once  . digoxin  0.0625 mg Oral QODAY  . doxycycline  100 mg Oral Q12H  . feeding supplement (ENSURE ENLIVE)  237 mL Oral BID BM  . ferumoxytol  510 mg Intravenous Once  . folic acid  1 mg Intravenous Daily  . furosemide  60 mg Oral BID  . insulin aspart  0-15 Units Subcutaneous TID WC  . insulin aspart  0-5 Units Subcutaneous QHS  . isosorbide mononitrate  30 mg Oral Daily  . levothyroxine  25 mcg Oral QAC breakfast  . loratadine  10 mg Oral Daily  . mupirocin ointment  1 application Nasal BID  . thiamine  100 mg Intravenous Daily   Dg Chest 2 View  11/30/2014   CLINICAL DATA:  Shortness of breath.  EXAM: CHEST  2 VIEW  COMPARISON:  10/17/2014.  01/10/2014.  FINDINGS: Mediastinum hilar structures are normal. Cardiac pacer with lead tips in right atrium right ventricle. Cardiomegaly. Mild bilateral interstitial prominence with Kerley B-lines noted. These findings are most consistent mild congestive heart failure. Tiny left pleural effusion cannot be excluded. No pneumothorax. Degenerative changes both shoulders.  IMPRESSION: Findings consistent with mild congestive  heart failure with pulmonary interstitial edema and tiny left pleural effusion. Cardiac pacer noted in stable position.   Electronically Signed   By: Marcello Moores  Register   On: 11/30/2014 08:42   US Renal  11/30/2014   CLINICAL DATA:  Chronic kidney disease stage 4  EXAM: RENAL / URINARY TRACT ULTRASOUND COMPLETE  COMPARISON:  01/17/2014  FINDINGS: Right Kidney:  Length: 9.6 cm. Extrarenal pelvis on the right similar to prior study. 12 mm cyst midpole. Increased renal echogenicity.  Left Kidney:  Length: 11.1 cm. 12 mm upper pole cyst. Increased echogenicity of the renal cortex.  Bladder:  Appears normal for degree of bladder distention.  IMPRESSION: Medical renal disease.   Electronically Signed   By: Skipper Cliche M.D.   On: 11/30/2014 17:22   BMET    Component Value Date/Time   NA 138 12/01/2014 0502   K 3.4* 12/01/2014 0502   CL 101 12/01/2014 0502   CO2 27 12/01/2014 0502   GLUCOSE 137* 12/01/2014 0502   BUN 29* 12/01/2014 0502   CREATININE 1.92* 12/01/2014 0502   CREATININE 1.17* 01/20/2013 1010   CALCIUM 9.1 12/01/2014 0502   GFRNONAA 25* 12/01/2014 0502   GFRAA 29* 12/01/2014 0502   CBC    Component Value Date/Time   WBC 4.8 12/01/2014 0502   RBC  2.91* 12/01/2014 0502   RBC 2.52* 10/18/2014 0850   HGB 9.0* 12/01/2014 0502   HCT 26.6* 12/01/2014 0502   PLT 139* 12/01/2014 0502   MCV 91.4 12/01/2014 0502   MCH 30.9 12/01/2014 0502   MCHC 33.8 12/01/2014 0502   RDW 15.5 12/01/2014 0502   LYMPHSABS 1.0 10/17/2014 1000   MONOABS 0.4 10/17/2014 1000   EOSABS 0.1 10/17/2014 1000   BASOSABS 0.0 10/17/2014 1000     Assessment:  1. CKD 3 sec HTN and poor CO.  UO fair 2. Anemia 3. Cardiomyopathy  EF 15-20% 4.  PAF   Plan: 1.  Will need outpt ESA as was to be arranged by her oncologist or PCP ( Dr Karie Kirks) at [facility] ( We can't order it as we don't have priveledges there).  Recommend procrit 20,000u SQ q 2 weeks ( or aranesp 158mcg q 2 weeks) 2. She will need FU with Dr  Marval Regal as outpt.  She can go from renal standpoint  Nillie Bartolotta T

## 2014-12-01 NOTE — Evaluation (Signed)
Occupational Therapy Evaluation Patient Details Name: [name] MRN: [id number] DOB: 1946-01-06 Today's Date: 23-Dec-2014    History of Present Illness Pt adm with chronic anemia. PMH - CKD, DM, ICD, afib   Clinical Impression   PTA pt lived at home and was independent with use of RW. Pt currently at Mod I/Supervision for ADLs. Pt has no further acute OT needs.     Follow Up Recommendations  No OT follow up    Equipment Recommendations  None recommended by OT    Recommendations for Other Services       Precautions / Restrictions Precautions Precautions: Fall      Mobility Bed Mobility Overal bed mobility: Needs Assistance Bed Mobility: Supine to Sit;Sit to Supine     Supine to sit: Min assist Sit to supine: Min assist   General bed mobility comments: Assist to bring trunk up into sitting and assist to bring legs back up into bed when returning to supine  Transfers Overall transfer level: Needs assistance Equipment used: Rolling walker (2 wheeled) Transfers: Sit to/from Stand Sit to Stand: Supervision                   ADL Overall ADL's : Modified independent                                       General ADL Comments: Pt has husband to assist at D/C and is mod I/Supervision with ADLs and use of RW. Educated pt on safety.      Vision Additional Comments: No change from baseline   Perception     Praxis      Pertinent Vitals/Pain Pain Assessment: No/denies pain     Hand Dominance Right   Extremity/Trunk Assessment Upper Extremity Assessment Upper Extremity Assessment: Overall WFL for tasks assessed   Lower Extremity Assessment Lower Extremity Assessment: Overall WFL for tasks assessed   Cervical / Trunk Assessment Cervical / Trunk Assessment: Normal   Communication Communication Communication: No difficulties   Cognition Arousal/Alertness: Awake/alert Behavior During Therapy: WFL for tasks  assessed/performed Overall Cognitive Status: Within Functional Limits for tasks assessed                                Home Living Family/patient expects to be discharged to:: Private residence Living Arrangements: Spouse/significant other Available Help at Discharge: Family;Available 24 hours/day Type of Home: House Home Access: Stairs to enter [employer] of Steps: 8 Entrance Stairs-Rails: Right;Left Home Layout: One level     Bathroom Shower/Tub: Occupational psychologist: Standard     Home Equipment: Environmental consultant - 4 wheels;Cane - single point;Shower seat;Wheelchair - Press photographer;Bedside commode          Prior Functioning/Environment Level of Independence: Independent with assistive device(s)        Comments: amb with use of walker at times    OT Diagnosis: Generalized weakness    End of Session Equipment Utilized During Treatment: Gait belt;Rolling walker  Activity Tolerance: Patient tolerated treatment well Patient left: in bed;with call bell/phone within reach   Time: 0539-7673 OT Time Calculation (min): 10 min Charges:  OT General Charges $OT Visit: 1 Procedure OT Evaluation $Initial OT Evaluation Tier I: 1 Procedure G-Codes:    Juluis Rainier 23-Dec-2014, 4:50 PM  Cyndie Chime, OTR/L Occupational Therapist [phone] (pager)

## 2014-12-02 DIAGNOSIS — I4891 Unspecified atrial fibrillation: Secondary | ICD-10-CM

## 2014-12-02 LAB — RESPIRATORY VIRUS PANEL
Adenovirus: NEGATIVE
INFLUENZA A: NEGATIVE
Influenza B: NEGATIVE
Metapneumovirus: NEGATIVE
PARAINFLUENZA 3 A: NEGATIVE
Parainfluenza 1: NEGATIVE
Parainfluenza 2: NEGATIVE
Respiratory Syncytial Virus A: NEGATIVE
Respiratory Syncytial Virus B: NEGATIVE
Rhinovirus: POSITIVE — AB

## 2014-12-02 LAB — CBC
HCT: 30.2 % — ABNORMAL LOW (ref 36.0–46.0)
Hemoglobin: 10.1 g/dL — ABNORMAL LOW (ref 12.0–15.0)
MCH: 30.7 pg (ref 26.0–34.0)
MCHC: 33.4 g/dL (ref 30.0–36.0)
MCV: 91.8 fL (ref 78.0–100.0)
PLATELETS: 137 10*3/uL — AB (ref 150–400)
RBC: 3.29 MIL/uL — AB (ref 3.87–5.11)
RDW: 15.5 % (ref 11.5–15.5)
WBC: 4.5 10*3/uL (ref 4.0–10.5)

## 2014-12-02 LAB — GLUCOSE, CAPILLARY: Glucose-Capillary: 180 mg/dL — ABNORMAL HIGH (ref 65–99)

## 2014-12-02 LAB — BASIC METABOLIC PANEL
ANION GAP: 11 (ref 5–15)
BUN: 32 mg/dL — ABNORMAL HIGH (ref 6–20)
CHLORIDE: 103 mmol/L (ref 101–111)
CO2: 25 mmol/L (ref 22–32)
Calcium: 9.3 mg/dL (ref 8.9–10.3)
Creatinine, Ser: 1.8 mg/dL — ABNORMAL HIGH (ref 0.44–1.00)
GFR calc Af Amer: 32 mL/min — ABNORMAL LOW (ref >60)
GFR calc non Af Amer: 27 mL/min — ABNORMAL LOW (ref >60)
Glucose, Bld: 169 mg/dL — ABNORMAL HIGH (ref 65–99)
Potassium: 3.6 mmol/L (ref 3.5–5.1)
Sodium: 139 mmol/L (ref 135–145)

## 2014-12-02 LAB — VITAMIN D 25 HYDROXY (VIT D DEFICIENCY, FRACTURES): VIT D 25 HYDROXY: 22 ng/mL — AB (ref 30.0–100.0)

## 2014-12-02 LAB — PARATHYROID HORMONE, INTACT (NO CA): PTH: 122 pg/mL — ABNORMAL HIGH (ref 15–65)

## 2014-12-02 LAB — PROTIME-INR
INR: 1.47 (ref 0.00–1.49)
PROTHROMBIN TIME: 17.9 s — AB (ref 11.6–15.2)

## 2014-12-02 MED ORDER — FERROUS SULFATE 324 (65 FE) MG PO TBEC: 325.0000 mg | DELAYED_RELEASE_TABLET | Freq: Two times a day (BID) | ORAL | Status: DC

## 2014-12-02 NOTE — Progress Notes (Signed)
S: No new CO O:BP 152/70 mmHg  Pulse 70  Temp(Src) 98.8 F (37.1 C) (Oral)  Resp 14  Ht 5\' 5"  (1.651 m)  Wt 69.1 kg (152 lb 5.4 oz)  BMI 25.35 kg/m2  SpO2 97%  Intake/Output Summary (Last 24 hours) at 12/02/14 1013 Last data filed at 12/02/14 1010  Gross per 24 hour  Intake    542 ml  Output   1725 ml  Net  -1183 ml   Weight change: -0.391 kg (-13.8 oz) VVO:HYWVP and alert CVS: RRR (paced) Resp: clear Abd: + BS NTND  No abd bruits Ext: no edema NEURO: CNI, Ox3 no asterixis   Renal US shows increased echogenicity with Rt kidney being smaller than Lt . amiodarone  200 mg Oral Daily  . carvedilol  9.375 mg Oral BID WC  . Chlorhexidine Gluconate Cloth  6 each Topical Q0600  . digoxin  0.0625 mg Oral QODAY  . feeding supplement (ENSURE ENLIVE)  237 mL Oral BID BM  . folic acid  1 mg Oral Daily  . furosemide  60 mg Oral BID  . insulin aspart  0-15 Units Subcutaneous TID WC  . insulin aspart  0-5 Units Subcutaneous QHS  . isosorbide mononitrate  30 mg Oral Daily  . levothyroxine  25 mcg Oral QAC breakfast  . loratadine  10 mg Oral Daily  . mupirocin ointment  1 application Nasal BID  . thiamine  100 mg Oral Daily  . Warfarin - Pharmacist Dosing Inpatient   Does not apply q1800   US Renal  11/30/2014   CLINICAL DATA:  Chronic kidney disease stage 4  EXAM: RENAL / URINARY TRACT ULTRASOUND COMPLETE  COMPARISON:  01/17/2014  FINDINGS: Right Kidney:  Length: 9.6 cm. Extrarenal pelvis on the right similar to prior study. 12 mm cyst midpole. Increased renal echogenicity.  Left Kidney:  Length: 11.1 cm. 12 mm upper pole cyst. Increased echogenicity of the renal cortex.  Bladder:  Appears normal for degree of bladder distention.  IMPRESSION: Medical renal disease.   Electronically Signed   By: Skipper Cliche M.D.   On: 11/30/2014 17:22   BMET    Component Value Date/Time   NA 139 12/02/2014 0610   K 3.6 12/02/2014 0610   CL 103 12/02/2014 0610   CO2 25 12/02/2014 0610   GLUCOSE  169* 12/02/2014 0610   BUN 32* 12/02/2014 0610   CREATININE 1.80* 12/02/2014 0610   CREATININE 1.17* 01/20/2013 1010   CALCIUM 9.3 12/02/2014 0610   GFRNONAA 27* 12/02/2014 0610   GFRAA 32* 12/02/2014 0610   CBC    Component Value Date/Time   WBC 4.5 12/02/2014 0610   RBC 3.29* 12/02/2014 0610   RBC 2.52* 10/18/2014 0850   HGB 10.1* 12/02/2014 0610   HCT 30.2* 12/02/2014 0610   PLT 137* 12/02/2014 0610   MCV 91.8 12/02/2014 0610   MCH 30.7 12/02/2014 0610   MCHC 33.4 12/02/2014 0610   RDW 15.5 12/02/2014 0610   LYMPHSABS 1.0 10/17/2014 1000   MONOABS 0.4 10/17/2014 1000   EOSABS 0.1 10/17/2014 1000   BASOSABS 0.0 10/17/2014 1000     Assessment:  1. CKD 3 sec HTN and poor CO.  Renal fx stable 2. Anemia, recieved aranesp 3. Cardiomyopathy  EF 15-20% 4.  PAF   Plan: 1.  She has appt with Dr Hinda Lenis on tues and he can mange her CKD and anemia 2. Will sign off  [name] T

## 2014-12-02 NOTE — Discharge Summary (Signed)
Physician Discharge Summary  [name] FTD:[id number] DOB: March 27, 1946 DOA: 11/30/2014  PCP: Robert Bellow, MD  Admit date: 11/30/2014 Discharge date: 12/02/2014  Recommendations for Outpatient Follow-up:  1. Pt will need to follow up with PCP in 2 weeks post discharge 2. Please obtain BMP to evaluate electrolytes and kidney function 3. Please have INR checked on 12/04/2014 or 12/05/2014 and adjust Coumadin dose accordingly for INR 2-2.5  4. Please follow up on the results of the respiratory viral panel   Discharge Diagnoses:  Chronic blood loss anemia  -This is multifactorial including anemia CKD, iron deficiency -Baseline hemoglobin 9-10 -Initial FOBT was negative on 11/30/2014 -The patient had 3 negative fecal occult blood tests during hospitalization including one that I performed personally -On 12/01/2014 I have personally performed a stool Hemoccult--negative -The patient has not yet received ESA although this was set up in [place] -Patient has been transfused 1 unit PRBC -Serum B12 433 -Transfused Aranesp 100 mcg, but ultimately the patient will need to be set up with outpatient hematology for routine Epo injections -Case was discussed with nephrology who recommends Procrit 20,000 units every 2-3 weeks -12/01/14--Case was discussed with patient's PCP, Dr. Karie Kirks -According to the patient's husband at the bedside, the patient has an appointment on Tuesday, 12/05/2014 with nephrology in [facility] to receive her erythropoietin injection -Transfuse Feraheme 510mg  x 1 -Start ferrous sulfate 325 mg twice a day  -10/21/2014 EGD--essentially normal except for GE junction stricture -10/24/2014 colonoscopy showed polyps and diverticula -10/25/2014 capsule endoscopy--lymphangiectasis with debris in small bowel Acute respiratory failure with hypoxia -Secondary to decompensated CHF and her anemia -Currently stable on room air presently -The patient was given  furosemide 40 mg IV 2 doses and then restarted on her home dose of furosemide  Acute on chronic systolic and diastolic CHF -appreciate cardiology -10/21/2014 echocardiogram shows EF 15-20% with grade 2 diastolic dysfunction -Daily weights  -Patient was given furosemide 40 mg IV twice a day, and then subsequently transitioned to her oral home dose of furosemide 60 mg twice a day  -The patient was seen by the CHF team, Dr. Haroldine Laws, who felt pt's fluid status was good for d/c -Pt has appt at CHF clinic on 12/04/14 -Continue  furosemide 60mg  po bid -Unable to give ACE inhibitor secondary to CKD -continue coreg, imdur -Patient dry weight to be around 148 pounds Fever -UA without pyuria -f/u blood culture--neg at time of d/c -CXR without infiltrate -await respiratory panel -As the patient is hemodynamically stable without any leukocytosis--discontinue all antibiotics and observe  -Patient remained afebrile and hemodynamically stable off antibiotics  -The patient did not have any fever for over 24 hours prior to discharge  Paroxysmal Afib -continue amiodarone -continue digoxin -CHADVASC= 7 -Restart warfarin as the patient has had 2 Hemoccult stools that have been negative -when resume recommend asking pharmacy to manage for goal INR 2-2.5 CKD stage 4 -appreciate renal consult -baseline creatinine ranges 1.6-2.0   -renal function remained stable throughout the hospitalization. Serum creatinine 1.80 on the day of discharge- HTN (hypertension) -Current blood pressure well controlled with current above medical regimen Hypothyroidism -cont Synthroid  COPD (chronic obstructive pulmonary disease)/ Tobacco abuse, in remission -Stable without evidence of wheezing -Stable on room air   History of pulmonary embolism -On chronic anticoagulation for atrial fibrillation  Discharge Condition: stable  Disposition: home  Diet:cardiac/2 gram sodium Wt Readings from Last 3 Encounters:    12/02/14 69.1 kg (152 lb 5.4 oz)  11/02/14 67.495 kg (148 lb  12.8 oz)  10/26/14 69.809 kg (153 lb 14.4 oz)    History of present illness:  69 year old female with a history of systolic and diastolic CHF with EF 18-56% with ICD implantation, CKD stage IV, atrial fibrillation on anticoagulation, history of PE, diabetes mellitus, COPD, anemia of CKD presented with 2-3 day history of fatigue, dyspnea on exertion. The patient has had an extensive GI workup including EGD, colonoscopy, and capsule endoscopy which have been largely unremarkable without evident source of bleed. She was evaluated by hematology in May 2016 who felt the patient may have anemia of CKD although a smoldering low-grade myelodysplastic syndrome cannot be ruled out. The patient presented to the emergency department and was found to have hemoglobin 7.6. The patient was discharged in May 2016 with hemoglobin of 10.2. The patient was transfused 1 unit PRBC. Her hemoglobin improved and remained stable. There were no active signs of bleeding. Fecal occult blood test was negative 3 during hospitalization. The patient received doses of Aranesp and Feraheme, and started on ferrous sulfate 325 mg twice a day. Her Coumadin was restarted. The patient was seen by the CHF team who felt that the patient was stable for discharge with her present medication regimen. Her furosemide dose was to continue at 60 mg po bid.    Consultants: Nephrology Cardiology/CHF Team  Discharge Exam: Filed Vitals:   12/02/14 0838  BP: 152/70  Pulse: 70  Temp:   Resp:    Filed Vitals:   12/01/14 2135 12/02/14 0549 12/02/14 0554 12/02/14 0838  BP: 127/65 146/71  152/70  Pulse: 70 70  70  Temp: 99.7 F (37.6 C) 98.8 F (37.1 C)    TempSrc: Oral Oral    Resp: 18 14    Height:      Weight:   69.1 kg (152 lb 5.4 oz)   SpO2: 96% 97%     General: A&O x 3, NAD, pleasant, cooperative Cardiovascular: RRR, no rub, no gallop, no S3, no JVD Respiratory:  bibasilar crackles without wheeze  Abdomen:soft, nontender, nondistended, positive bowel sounds Extremities: trace edema, No lymphangitis, no petechiae  Discharge Instructions  Discharge Instructions    Diet - low sodium heart healthy    Complete by:  As directed      Increase activity slowly    Complete by:  As directed             Medication List    STOP taking these medications        ivabradine 5 MG Tabs tablet  Commonly known as:  CORLANOR      TAKE these medications        amiodarone 200 MG tablet  Commonly known as:  PACERONE  Take 1 tablet (200 mg total) by mouth daily.     carvedilol 3.125 MG tablet  Commonly known as:  COREG  Take 3 tablets (9.375 mg total) by mouth 2 (two) times daily with a meal.     cetirizine 10 MG tablet  Commonly known as:  ZYRTEC  Take 10 mg by mouth daily as needed for allergies.     colchicine 0.6 MG tablet  Take 0.6 mg by mouth 2 (two) times daily.     digoxin 0.125 MG tablet  Commonly known as:  LANOXIN  Take 0.5 tablets (0.0625 mg total) by mouth every other day.     feeding supplement (ENSURE ENLIVE) Liqd  Take 237 mLs by mouth 2 (two) times daily between meals.     ferrous sulfate 324 (  65 FE) MG Tbec  Take 1 tablet (325 mg total) by mouth 2 (two) times daily.     furosemide 40 MG tablet  Commonly known as:  LASIX  TAKE 1 AND 1/2 TABLETS BY MOUTH TWICE DAILY.     isosorbide mononitrate 30 MG 24 hr tablet  Commonly known as:  IMDUR  Take 1 tablet (30 mg total) by mouth daily.     levothyroxine 25 MCG tablet  Commonly known as:  SYNTHROID, LEVOTHROID  Take 1 tablet (25 mcg total) by mouth daily before breakfast.     metoCLOPramide 5 MG tablet  Commonly known as:  REGLAN  Take 5 mg by mouth 4 (four) times daily.     ondansetron 4 MG tablet  Commonly known as:  ZOFRAN  Take 4 mg by mouth every 4 (four) hours as needed for nausea or vomiting.     oxyCODONE-acetaminophen 10-325 MG per tablet  Commonly known as:   PERCOCET  Take 1 tablet by mouth every 6 (six) hours as needed for pain.     polyethylene glycol powder powder  Commonly known as:  GLYCOLAX/MIRALAX  Take 1 Container by mouth daily as needed for mild constipation.     pravastatin 40 MG tablet  Commonly known as:  PRAVACHOL  Take 80 mg by mouth at bedtime.     warfarin 2.5 MG tablet  Commonly known as:  COUMADIN  Take 0.5 tablets (1.25 mg total) by mouth daily at 6 PM.         The results of significant diagnostics from this hospitalization (including imaging, microbiology, ancillary and laboratory) are listed below for reference.    Significant Diagnostic Studies: Dg Chest 2 View  11/30/2014   CLINICAL DATA:  Shortness of breath.  EXAM: CHEST  2 VIEW  COMPARISON:  10/17/2014.  01/10/2014.  FINDINGS: Mediastinum hilar structures are normal. Cardiac pacer with lead tips in right atrium right ventricle. Cardiomegaly. Mild bilateral interstitial prominence with Kerley B-lines noted. These findings are most consistent mild congestive heart failure. Tiny left pleural effusion cannot be excluded. No pneumothorax. Degenerative changes both shoulders.  IMPRESSION: Findings consistent with mild congestive heart failure with pulmonary interstitial edema and tiny left pleural effusion. Cardiac pacer noted in stable position.   Electronically Signed   By: Marcello Moores  Register   On: 11/30/2014 08:42   US Renal  11/30/2014   CLINICAL DATA:  Chronic kidney disease stage 4  EXAM: RENAL / URINARY TRACT ULTRASOUND COMPLETE  COMPARISON:  01/17/2014  FINDINGS: Right Kidney:  Length: 9.6 cm. Extrarenal pelvis on the right similar to prior study. 12 mm cyst midpole. Increased renal echogenicity.  Left Kidney:  Length: 11.1 cm. 12 mm upper pole cyst. Increased echogenicity of the renal cortex.  Bladder:  Appears normal for degree of bladder distention.  IMPRESSION: Medical renal disease.   Electronically Signed   By: Skipper Cliche M.D.   On: 11/30/2014 17:22      Microbiology: Recent Results (from the past 240 hour(s))  Rapid strep screen   (If patient has fever and/or without cough or runny nose)     Status: None   Collection Time: 11/30/14  8:05 AM  Result Value Ref Range Status   Streptococcus, Group A Screen (Direct) NEGATIVE NEGATIVE Final    Comment: (NOTE) A Rapid Antigen test may result negative if the antigen level in the sample is below the detection level of this test. The FDA has not cleared this test as a stand-alone test therefore the  rapid antigen negative result has reflexed to a Group A Strep culture.   MRSA PCR Screening     Status: Abnormal   Collection Time: 11/30/14  1:19 PM  Result Value Ref Range Status   MRSA by PCR POSITIVE (A) NEGATIVE Final    Comment:        The GeneXpert MRSA Assay (FDA approved for NASAL specimens only), is one component of a comprehensive MRSA colonization surveillance program. It is not intended to diagnose MRSA infection nor to guide or monitor treatment for MRSA infections. RESULT CALLED TO, READ BACK BY AND VERIFIED WITH: BOWIE,C RN @ 2119 11/30/14 LEONARDA,   Respiratory virus panel     Status: Abnormal   Collection Time: 11/30/14  1:31 PM  Result Value Ref Range Status   Respiratory Syncytial Virus A Negative Negative Final   Respiratory Syncytial Virus B Negative Negative Final   Influenza A Negative Negative Final   Influenza B Negative Negative Final   Parainfluenza 1 Negative Negative Final   Parainfluenza 2 Negative Negative Final   Parainfluenza 3 Negative Negative Final   Metapneumovirus Negative Negative Final   Rhinovirus Positive (A) Negative Final   Adenovirus Negative Negative Final    Comment: (NOTE) Performed At: [facility] [place], [place] [id number] Lindon Romp MD YJ:[id number]   Urine culture     Status: None   Collection Time: 11/30/14  1:33 PM  Result Value Ref Range Status   Specimen Description URINE, RANDOM  Final    Special Requests NONE  Final   Colony Count   Final    9,000 COLONIES/ML Performed at [facility]    Culture   Final    INSIGNIFICANT GROWTH Performed at [facility]    Report Status 12/01/2014 FINAL  Final  Culture, blood (routine x 2)     Status: None (Preliminary result)   Collection Time: 11/30/14  7:12 PM  Result Value Ref Range Status   Specimen Description BLOOD RIGHT ARM  Final   Special Requests   Final    BOTTLES DRAWN AEROBIC AND ANAEROBIC 10CC BLUE 5CC RED   Culture   Final           BLOOD CULTURE RECEIVED NO GROWTH TO DATE CULTURE WILL BE HELD FOR 5 DAYS BEFORE ISSUING A FINAL NEGATIVE REPORT Performed at [facility]    Report Status PENDING  Incomplete  Culture, blood (routine x 2)     Status: None (Preliminary result)   Collection Time: 11/30/14  7:15 PM  Result Value Ref Range Status   Specimen Description BLOOD LEFT WRIST  Final   Special Requests BOTTLES DRAWN AEROBIC AND ANAEROBIC 10CC EACH  Final   Culture   Final           BLOOD CULTURE RECEIVED NO GROWTH TO DATE CULTURE WILL BE HELD FOR 5 DAYS BEFORE ISSUING A FINAL NEGATIVE REPORT Performed at [facility]    Report Status PENDING  Incomplete     Labs: Basic Metabolic Panel:  Recent Labs Lab 11/30/14 0825 12/01/14 0502 12/02/14 0610  NA 133* 138 139  K 3.7 3.4* 3.6  CL 98* 101 103  CO2 22 27 25   GLUCOSE 271* 137* 169*  BUN 33* 29* 32*  CREATININE 2.00* 1.92* 1.80*  CALCIUM 8.5* 9.1 9.3  PHOS  --  2.8  --    Liver Function Tests:  Recent Labs Lab 12/01/14 0502  ALBUMIN 2.9*   No results for  input(s): LIPASE, AMYLASE in the last 168 hours. No results for input(s): AMMONIA in the last 168 hours. CBC:  Recent Labs Lab 11/30/14 0825 11/30/14 1115 11/30/14 1917 12/01/14 0502 12/02/14 0610  WBC 9.5  --  6.6 4.8 4.5  HGB 7.6* 8.2* 9.2* 9.0* 10.1*  HCT 22.6* 24.7* 27.3* 26.6* 30.2*  MCV 91.9  --  91.6 91.4 91.8  PLT 156  --  144* 139* 137*    Cardiac Enzymes: No results for input(s): CKTOTAL, CKMB, CKMBINDEX, TROPONINI in the last 168 hours. BNP: Invalid input(s): POCBNP CBG:  Recent Labs Lab 12/01/14 0744 12/01/14 1242 12/01/14 1655 12/01/14 2200 12/02/14 0746  GLUCAP 149* 148* 147* 135* 180*    Time coordinating discharge:  Greater than 30 minutes  Signed:  Lacharles Altschuler, DO [facility] Pager: [phone] 12/02/2014, 11:16 AM

## 2014-12-02 NOTE — Progress Notes (Signed)
Patient discharge teaching given, including activity, diet, follow-up appoints, and medications. Patient verbalized understanding of all discharge instructions. IV access was d/c'd. Vitals are stable. Skin is intact except as charted in most recent assessments. Pt to be escorted out by NT, to be driven home by family. 

## 2014-12-03 LAB — CULTURE, GROUP A STREP: STREP A CULTURE: NEGATIVE

## 2014-12-04 ENCOUNTER — Encounter (HOSPITAL_COMMUNITY): Payer: Self-pay

## 2014-12-04 ENCOUNTER — Ambulatory Visit (HOSPITAL_BASED_OUTPATIENT_CLINIC_OR_DEPARTMENT_OTHER)
Admission: RE | Admit: 2014-12-04 | Discharge: 2014-12-04 | Disposition: A | Discharge status: Home / Self Care | Payer: Commercial Managed Care - HMO | Source: Ambulatory Visit | Attending: Internal Medicine | Admitting: Internal Medicine

## 2014-12-04 VITALS — BP 98/58 | HR 89 | Wt 149.2 lb

## 2014-12-04 DIAGNOSIS — B952 Enterococcus as the cause of diseases classified elsewhere: Secondary | ICD-10-CM | POA: Diagnosis present

## 2014-12-04 DIAGNOSIS — I480 Paroxysmal atrial fibrillation: Secondary | ICD-10-CM | POA: Diagnosis not present

## 2014-12-04 DIAGNOSIS — I5042 Chronic combined systolic (congestive) and diastolic (congestive) heart failure: Secondary | ICD-10-CM | POA: Diagnosis not present

## 2014-12-04 DIAGNOSIS — R0602 Shortness of breath: Secondary | ICD-10-CM | POA: Diagnosis not present

## 2014-12-04 DIAGNOSIS — Z87891 Personal history of nicotine dependence: Secondary | ICD-10-CM

## 2014-12-04 DIAGNOSIS — D638 Anemia in other chronic diseases classified elsewhere: Secondary | ICD-10-CM | POA: Diagnosis present

## 2014-12-04 DIAGNOSIS — I447 Left bundle-branch block, unspecified: Secondary | ICD-10-CM | POA: Diagnosis present

## 2014-12-04 DIAGNOSIS — E785 Hyperlipidemia, unspecified: Secondary | ICD-10-CM | POA: Diagnosis present

## 2014-12-04 DIAGNOSIS — G4733 Obstructive sleep apnea (adult) (pediatric): Secondary | ICD-10-CM | POA: Diagnosis present

## 2014-12-04 DIAGNOSIS — I129 Hypertensive chronic kidney disease with stage 1 through stage 4 chronic kidney disease, or unspecified chronic kidney disease: Secondary | ICD-10-CM | POA: Diagnosis present

## 2014-12-04 DIAGNOSIS — N390 Urinary tract infection, site not specified: Secondary | ICD-10-CM | POA: Diagnosis present

## 2014-12-04 DIAGNOSIS — Y831 Surgical operation with implant of artificial internal device as the cause of abnormal reaction of the patient, or of later complication, without mention of misadventure at the time of the procedure: Secondary | ICD-10-CM | POA: Diagnosis present

## 2014-12-04 DIAGNOSIS — E1122 Type 2 diabetes mellitus with diabetic chronic kidney disease: Secondary | ICD-10-CM | POA: Diagnosis present

## 2014-12-04 DIAGNOSIS — I429 Cardiomyopathy, unspecified: Secondary | ICD-10-CM | POA: Diagnosis present

## 2014-12-04 DIAGNOSIS — N184 Chronic kidney disease, stage 4 (severe): Secondary | ICD-10-CM | POA: Diagnosis present

## 2014-12-04 DIAGNOSIS — K219 Gastro-esophageal reflux disease without esophagitis: Secondary | ICD-10-CM | POA: Diagnosis present

## 2014-12-04 DIAGNOSIS — M1990 Unspecified osteoarthritis, unspecified site: Secondary | ICD-10-CM | POA: Diagnosis present

## 2014-12-04 DIAGNOSIS — I330 Acute and subacute infective endocarditis: Secondary | ICD-10-CM | POA: Diagnosis present

## 2014-12-04 DIAGNOSIS — Z7901 Long term (current) use of anticoagulants: Secondary | ICD-10-CM

## 2014-12-04 DIAGNOSIS — J449 Chronic obstructive pulmonary disease, unspecified: Secondary | ICD-10-CM | POA: Diagnosis present

## 2014-12-04 DIAGNOSIS — T827XXA Infection and inflammatory reaction due to other cardiac and vascular devices, implants and grafts, initial encounter: Principal | ICD-10-CM | POA: Diagnosis present

## 2014-12-04 DIAGNOSIS — Z96641 Presence of right artificial hip joint: Secondary | ICD-10-CM | POA: Diagnosis present

## 2014-12-04 DIAGNOSIS — Z86711 Personal history of pulmonary embolism: Secondary | ICD-10-CM

## 2014-12-04 DIAGNOSIS — E1165 Type 2 diabetes mellitus with hyperglycemia: Secondary | ICD-10-CM | POA: Diagnosis present

## 2014-12-04 NOTE — Patient Instructions (Signed)
FOLLOW UP in 4 weeks.

## 2014-12-04 NOTE — Progress Notes (Signed)
Patient ID: [name], female   DOB: January 29, 1946, 69 y.o.   MRN: [id number] PCP: Dr. Karie Kirks Cardiology: Dr. Lovena Le  69 yo with history of chronic systolic CHF from nonischemic cardiomyopathy, prior PE, CKD, and paroxysmal atrial fibrillation presents for CHF clinic evaluation.  She has a long history of cardiomyopathy, dating back to 1999.  At that time, she had coronary angiography showing no significant disease. She was initially followed by Dr Lattie Haw, later by Dr. Lovena Le. She had her initial CRT-D device in 2005.  This was removed in 2007 due to enterococcal bacteremia with vegetation.  She had Medtronic CRT- D device placed in 8/14.  She has had trouble with paroxysmal atrial fibrillation, most recently she was put on amiodarone and cardioverted in 12/14.  TEE in 12/14 showed EF 15% but RV appeared normal.   Had RHC on 04/18/14 for possible low output. This showed volume depletion with normal cardiac output. Lasix held for a few days then cut back  From 80 bid to 40 bid. Lisinopril also help but has been restarted.   Admitted with anemia 6/9 through 12/02/2014. Received 1UPRBCs. Diuresed with IV lasix. She had follow up with nephrology for Epogen injections. Discharge weight was 152 pounds.   She returns for post hospital follow up. Denies SOB/PND/Orthopnea. Denies BRBPR. Weight at home 149-150 pounds. INR followed by PCP. Takes all medications.  Limited activity at home.   Labs (7/15): K 3.5, creatinine 1.78, hemoglobin 9.4,m BNP 24607 Labs (10/15): K 4.7, creatinine 2.3, digoxin 0.7, BNP 8376, HCT 30.9 Labs (04/18/14): K 4.3 creatinine 2.8  Labs (11/15): K 5.3, creatinine 1.9 Labs (10/26/2014) K 3.7 Creatinine 1.94 Hgb 9.3  Labs 12/02/2014: K 3.6 Creatinine 1.80   Optivol shows fluid index trending up. Activiy less than 1 hour per day.  PMH:  1. Chronic systolic CHF: Nonischemic cardiomyopathy.  1999 had LHC with normal coronaries.  12/05 had Guidant BiV ICD placed. 5/07 had enterococcal  bacteremia with ICD lead vegetation, device extracted.  8/14 had placement of Medtronic BiV ICD device.  Cardiolite (12/09) with multiple wall segments showing scarring, some inferior ischemia.  Echo (6/14) with EF 15-20%, diffuse hypokinesis with some regionality, normal RV size and systolic function.  TEE (12/14) with EF 15%, normal RV size and systolic function. RHC (10/15) with mean RA 1, PA 27/8, mean PCWP 5, CI 2.71. 2. PE: 2/07 after right THR.  3. Right THR 4. LBBB 5. GERD 6. Hyperlipidemia 7. HTN 8. Anemia: 12/14 had EGD that was normal, colonoscopy with 3 polyps removed.  9. CKD 10. OSA: Mild, did not tolerate OSA 11. Atrial fibrillation: Paroxysmal.  TEE-guided DCCV in 2/14, TEE-guided DCCV in 12/14 on amiodarone.    SH: Married, lives in [place], previous smoker.   FH: Mother with MI.   ROS: All systems reviewed and negative except as per HPI.   Current Outpatient Prescriptions  Medication Sig Dispense Refill  . amiodarone (PACERONE) 200 MG tablet Take 1 tablet (200 mg total) by mouth daily. 90 tablet 3  . carvedilol (COREG) 3.125 MG tablet Take 3 tablets (9.375 mg total) by mouth 2 (two) times daily with a meal. 180 tablet 2  . cetirizine (ZYRTEC) 10 MG tablet Take 10 mg by mouth daily as needed for allergies.     Marland Kitchen digoxin (LANOXIN) 0.125 MG tablet Take 0.5 tablets (0.0625 mg total) by mouth every other day. 30 tablet 3  . feeding supplement, ENSURE ENLIVE, (ENSURE ENLIVE) LIQD Take 237 mLs by mouth 2 (two)  times daily between meals. 237 mL 12  . ferrous sulfate 324 (65 FE) MG TBEC Take 1 tablet (325 mg total) by mouth 2 (two) times daily. 60 tablet   . furosemide (LASIX) 40 MG tablet TAKE 1 AND 1/2 TABLETS BY MOUTH TWICE DAILY. 90 tablet 3  . isosorbide mononitrate (IMDUR) 30 MG 24 hr tablet Take 1 tablet (30 mg total) by mouth daily. 30 tablet 0  . levothyroxine (SYNTHROID, LEVOTHROID) 25 MCG tablet Take 1 tablet (25 mcg total) by mouth daily before breakfast. 30 tablet  0  . metoCLOPramide (REGLAN) 5 MG tablet Take 5 mg by mouth 4 (four) times daily.    Marland Kitchen oxyCODONE-acetaminophen (PERCOCET) 10-325 MG per tablet Take 1 tablet by mouth every 6 (six) hours as needed for pain.     . polyethylene glycol powder (GLYCOLAX/MIRALAX) powder Take 1 Container by mouth daily as needed for mild constipation.     . pravastatin (PRAVACHOL) 40 MG tablet Take 80 mg by mouth at bedtime.     Marland Kitchen warfarin (COUMADIN) 2.5 MG tablet Take 0.5 tablets (1.25 mg total) by mouth daily at 6 PM. 10 tablet 0  . colchicine 0.6 MG tablet Take 0.6 mg by mouth 2 (two) times daily.     . ondansetron (ZOFRAN) 4 MG tablet Take 4 mg by mouth every 4 (four) hours as needed for nausea or vomiting.     No current facility-administered medications for this encounter.    BP 98/58 mmHg  Pulse 89  Wt 149 lb 4 oz (67.699 kg)  SpO2 98% General: NAD. Husband present.  Neck: JVP 5-6 cm, no thyromegaly or thyroid nodule.  Lungs: Clear to auscultation bilaterally with normal respiratory effort. CV: Nondisplaced PMI.  Heart regular S1/S2, no S3/S4, 1/6 early SEM RUSB.  No peripheral edema.  No carotid bruit.  Normal pedal pulses.  Abdomen: Soft, nontender, no hepatosplenomegaly, no distention.  Skin: Intact without lesions or rashes.  Neurologic: Alert and oriented x 3.  Psych: Normal affect. Extremities: No clubbing or cyanosis.   HEENT: Normal.   Assessment/Plan: Reviewed d/c summary from 12/02/2014.  69 yo with nonischemic CMP/chronic systolic CHF, paroxysmal atrial fibrillation, CKD, and history of anemia presented with profound anemia, weakness and hypotension.  1. Chronic systolic CHF: Patient has a nonischemic cardiomyopathy. Echo EF 15-20% Grade II DD.  RHC (10/15) showed low filling pressures and normal cardiac output. She is unable to do CPX testing and would not be LVAD candidate. NYHA II-III Volume status stable. She does not look volume overloaded. BP stable. Continue current dose of lasix.   - Continue current hydralazine/Imdur - Continue coreg to 9.375 mg bid.  - No Ace or spiro with CKD.  - Digoxin every other day with level 1.1. Check Dig level tomorrow.   2. Atrial fibrillation: Paroxysmal  - Continue amiodarone for now. No Afib noted on optivol.  TSH high, Levoxyl begun.- She has been on coumadin. No definite GI bleeding source noted.Goal 2-2.5. Followed by PCP. As hemoglobin continues to drift down may need to stop anticoagulant.   3.  CKD: Keep off lisinopril and use hydralazine/Imdur instead.  4. Anemia: She had EGD and c-scope in 12/14. In 12/15, she had transfusion but apparently no-showed for her GI workup. Seen by hematology this admission, suspect normocytic anemia is most likely due to combination of CKD and hypothyroidism, less likely MDS. Erythropoeitin started. She has had 5 units PRBCs in May and 1Unit in June. Per GI -Iron deficiency anemia with blood in  stool: few scattered lymphangiectasias noted on the small bowel capsule study but no source of blood loss identified. Small lesions could be missed as she had some debris in the small bowel. Continue aranesp injections every 3 weeks. Starting epogen weekly.  If she has recurrent bleed can consider stopping.    Check check Dig level tomorrow by PCP ad fax results.    Follow up in 4 weeks   Lisanne Ponce NP-C  12/04/2014

## 2014-12-04 NOTE — Progress Notes (Signed)
Advanced Heart Failure Medication Review by a Pharmacist  Does the patient  feel that his/her medications are working for him/her?  yes  Has the patient been experiencing any side effects to the medications prescribed?  no  Does the patient measure his/her own blood pressure or blood glucose at home?  no   Does the patient have any problems obtaining medications due to transportation or finances?   no  Understanding of regimen: fair Understanding of indications: fair Potential of compliance: good    Pharmacist comments: Patient presents to heart failure clinic with her husband and medications were reviewed with a pharmacist. Patient states that her husband helps her organize her meds in a pill box. He also brings a list with him today of her medications. No medication discrepancies noted, patient does not have any med-related questions at this time.   Safiyah Cisney E. Dariella Gillihan, Pharm.D Clinical Pharmacy Resident Pager: [phone] 12/04/2014 10:20 AM

## 2014-12-05 LAB — VITAMIN D 1,25 DIHYDROXY
Vitamin D 1, 25 (OH)2 Total: 35 pg/mL
Vitamin D2 1, 25 (OH)2: <10 pg/mL
Vitamin D3 1, 25 (OH)2: 35 pg/mL

## 2014-12-06 ENCOUNTER — Encounter (HOSPITAL_COMMUNITY): Payer: Self-pay

## 2014-12-06 ENCOUNTER — Inpatient Hospital Stay (HOSPITAL_COMMUNITY)
Admission: EM | Admit: 2014-12-06 | Discharge: 2014-12-15 | DRG: 260 | Disposition: A | Discharge status: Home Health | Payer: Commercial Managed Care - HMO | Attending: Internal Medicine | Admitting: Internal Medicine

## 2014-12-06 ENCOUNTER — Emergency Department (HOSPITAL_COMMUNITY): Payer: Commercial Managed Care - HMO

## 2014-12-06 DIAGNOSIS — I10 Essential (primary) hypertension: Secondary | ICD-10-CM | POA: Diagnosis present

## 2014-12-06 DIAGNOSIS — B952 Enterococcus as the cause of diseases classified elsewhere: Secondary | ICD-10-CM

## 2014-12-06 DIAGNOSIS — R7881 Bacteremia: Secondary | ICD-10-CM | POA: Diagnosis present

## 2014-12-06 DIAGNOSIS — J449 Chronic obstructive pulmonary disease, unspecified: Secondary | ICD-10-CM | POA: Diagnosis present

## 2014-12-06 DIAGNOSIS — E118 Type 2 diabetes mellitus with unspecified complications: Secondary | ICD-10-CM | POA: Insufficient documentation

## 2014-12-06 DIAGNOSIS — R509 Fever, unspecified: Secondary | ICD-10-CM | POA: Diagnosis present

## 2014-12-06 DIAGNOSIS — Z95810 Presence of automatic (implantable) cardiac defibrillator: Secondary | ICD-10-CM

## 2014-12-06 DIAGNOSIS — J438 Other emphysema: Secondary | ICD-10-CM

## 2014-12-06 DIAGNOSIS — N184 Chronic kidney disease, stage 4 (severe): Secondary | ICD-10-CM | POA: Diagnosis present

## 2014-12-06 DIAGNOSIS — Z86711 Personal history of pulmonary embolism: Secondary | ICD-10-CM | POA: Diagnosis present

## 2014-12-06 DIAGNOSIS — I4891 Unspecified atrial fibrillation: Secondary | ICD-10-CM | POA: Diagnosis present

## 2014-12-06 DIAGNOSIS — Z7901 Long term (current) use of anticoagulants: Secondary | ICD-10-CM

## 2014-12-06 DIAGNOSIS — E1165 Type 2 diabetes mellitus with hyperglycemia: Secondary | ICD-10-CM

## 2014-12-06 DIAGNOSIS — R0602 Shortness of breath: Secondary | ICD-10-CM | POA: Diagnosis present

## 2014-12-06 DIAGNOSIS — E1129 Type 2 diabetes mellitus with other diabetic kidney complication: Secondary | ICD-10-CM

## 2014-12-06 DIAGNOSIS — N189 Chronic kidney disease, unspecified: Secondary | ICD-10-CM

## 2014-12-06 DIAGNOSIS — I5042 Chronic combined systolic (congestive) and diastolic (congestive) heart failure: Secondary | ICD-10-CM | POA: Diagnosis present

## 2014-12-06 DIAGNOSIS — I38 Endocarditis, valve unspecified: Secondary | ICD-10-CM

## 2014-12-06 DIAGNOSIS — D631 Anemia in chronic kidney disease: Secondary | ICD-10-CM | POA: Diagnosis present

## 2014-12-06 DIAGNOSIS — T827XXA Infection and inflammatory reaction due to other cardiac and vascular devices, implants and grafts, initial encounter: Secondary | ICD-10-CM | POA: Insufficient documentation

## 2014-12-06 DIAGNOSIS — IMO0002 Reserved for concepts with insufficient information to code with codable children: Secondary | ICD-10-CM

## 2014-12-06 LAB — CBC WITH DIFFERENTIAL/PLATELET
Basophils Absolute: 0 10*3/uL (ref 0.0–0.1)
Basophils Relative: 0 % (ref 0–1)
Eosinophils Absolute: 0.1 10*3/uL (ref 0.0–0.7)
Eosinophils Relative: 1 % (ref 0–5)
HCT: 30.7 % — ABNORMAL LOW (ref 36.0–46.0)
Hemoglobin: 10.1 g/dL — ABNORMAL LOW (ref 12.0–15.0)
Lymphocytes Relative: 9 % — ABNORMAL LOW (ref 12–46)
Lymphs Abs: 0.8 10*3/uL (ref 0.7–4.0)
MCH: 31.3 pg (ref 26.0–34.0)
MCHC: 32.9 g/dL (ref 30.0–36.0)
MCV: 95 fL (ref 78.0–100.0)
Monocytes Absolute: 0.4 10*3/uL (ref 0.1–1.0)
Monocytes Relative: 4 % (ref 3–12)
Neutro Abs: 8.3 10*3/uL — ABNORMAL HIGH (ref 1.7–7.7)
Neutrophils Relative %: 86 % — ABNORMAL HIGH (ref 43–77)
Platelets: 179 10*3/uL (ref 150–400)
RBC: 3.23 MIL/uL — ABNORMAL LOW (ref 3.87–5.11)
RDW: 15.7 % — ABNORMAL HIGH (ref 11.5–15.5)
WBC: 9.6 10*3/uL (ref 4.0–10.5)

## 2014-12-06 LAB — URINALYSIS, ROUTINE W REFLEX MICROSCOPIC
Bilirubin Urine: NEGATIVE
Glucose, UA: NEGATIVE mg/dL
Ketones, ur: NEGATIVE mg/dL
Leukocytes, UA: NEGATIVE
Nitrite: NEGATIVE
Protein, ur: NEGATIVE mg/dL
Specific Gravity, Urine: 1.015 (ref 1.005–1.030)
Urobilinogen, UA: 0.2 mg/dL (ref 0.0–1.0)
pH: 5.5 (ref 5.0–8.0)

## 2014-12-06 LAB — BASIC METABOLIC PANEL
Anion gap: 11 (ref 5–15)
BUN: 52 mg/dL — ABNORMAL HIGH (ref 6–20)
CO2: 25 mmol/L (ref 22–32)
Calcium: 8.9 mg/dL (ref 8.9–10.3)
Chloride: 99 mmol/L — ABNORMAL LOW (ref 101–111)
Creatinine, Ser: 2.11 mg/dL — ABNORMAL HIGH (ref 0.44–1.00)
GFR calc Af Amer: 26 mL/min — ABNORMAL LOW (ref >60)
GFR calc non Af Amer: 22 mL/min — ABNORMAL LOW (ref >60)
Glucose, Bld: 193 mg/dL — ABNORMAL HIGH (ref 65–99)
Potassium: 4.3 mmol/L (ref 3.5–5.1)
Sodium: 135 mmol/L (ref 135–145)

## 2014-12-06 LAB — URINE MICROSCOPIC-ADD ON

## 2014-12-06 LAB — I-STAT CG4 LACTIC ACID, ED: Lactic Acid, Venous: 1.71 mmol/L (ref 0.5–2.0)

## 2014-12-06 LAB — LACTIC ACID, PLASMA: Lactic Acid, Venous: 1.7 mmol/L (ref 0.5–2.0)

## 2014-12-06 MED ORDER — ACETAMINOPHEN 325 MG PO TABS
325.0000 mg | ORAL_TABLET | Freq: Once | ORAL | Status: AC
  Administered 2014-12-06: 325 mg via ORAL
  Filled 2014-12-06: qty 1

## 2014-12-06 MED ORDER — SODIUM CHLORIDE 0.9 % IV SOLN
INTRAVENOUS | Status: DC
  Administered 2014-12-06: 23:00:00 via INTRAVENOUS

## 2014-12-06 MED ORDER — ACETAMINOPHEN 325 MG PO TABS
650.0000 mg | ORAL_TABLET | Freq: Once | ORAL | Status: AC
  Administered 2014-12-06: 650 mg via ORAL
  Filled 2014-12-06: qty 2

## 2014-12-06 MED ORDER — DEXTROSE 5 % IV SOLN
1.0000 g | Freq: Once | INTRAVENOUS | Status: AC
  Administered 2014-12-06: 1 g via INTRAVENOUS
  Filled 2014-12-06: qty 10

## 2014-12-06 MED ORDER — ACETAMINOPHEN 325 MG PO TABS: 650.0000 mg | ORAL_TABLET | Freq: Once | ORAL | Status: DC

## 2014-12-06 NOTE — ED Provider Notes (Signed)
CSN: [id number]     Arrival date & time 12/06/14  2109 History   This chart was scribed for Virgel Manifold, MD by Julien Nordmann, ED Scribe. This patient was seen in room APA14/APA14 and the patient's care was started at 9:55 PM.    Chief Complaint  Patient presents with  . Shortness of Breath     The history is provided by the patient. No language interpreter was used.    HPI Comments: [name] is a 69 y.o. female who presents to the Emergency Department complaining of gradual worsening shortness of breath onset 45 minutes ago. Per husband, pt went on a small walk and was normal but soon after she wasn't herself. He notes pt had chills and shortness of breath. Pt has a prior history of having shortness of breath previously for the past ten months. He notes she was in the hospital for 9 days for another medical problem. She denies nausea, chest pain, problems with urination, unusual swelling, and unusual rash.   Past Medical History  Diagnosis Date  . Cardiomyopathy, nonischemic     a. 1999 nl cath;  b. 12/05 [facility] [place];  c. 10/2005 ICD extraction 2/2 enterococcus bacteremia and Veg on RV lead;  c. 05/2008 low risk Myoview (scarring w/ some evidence of inf ischemia);  d. 11/2012 Echo: EF 15-20%;  e. 01/2013 s/p MDT [place] [facility] CRT D, ser # [id number];  f. 05/2013 Echo: EF 15%.  . Enterococcal infection     a. 10/2005 - AICD-explanted  . Pulmonary embolism     a. 07/2005 after total right hip arthroplasty  . Hilar density     a. infrahilar mass/adenopathy on CT scan 5/07; subsequently  resolved  . LBBB (left bundle branch block)   . GERD (gastroesophageal reflux disease)   . Hyperlipidemia   . Hypertension   . Tobacco abuse     a. discontinued in 1997, and then resumed  . Urinary incontinence   . Anemia     a. mild/chronic  . Villous adenoma of colon     a. tubovillous adenomatous polyp with focal high grade dysplasia; presented with hematochezia - followed by Dr. Laural Golden.  .  Implantable cardioverter-defibrillator-CRT- Mdt     a.  01/2013 s/p MDT [place] [facility] CRT D, ser # [id number]  . Chronic systolic CHF (congestive heart failure)     a. 11/2012 Echo: EF 15-20%;  b. 05/2013 TEE EF 15%.  . Pneumonia 07/2005  . Obstructive sleep apnea     a. mild-did not tolerate CPAP (01/24/2013)  . History of blood transfusion   . Degenerative joint disease     of knees, shoulder, and hips  . PAF (paroxysmal atrial fibrillation)     a. 07/2012 s/p TEE/DCCV;  b. chronic coumadin;  c. 05/2013 Recurrent Afib->TEE/DCCV and amio initiation.  . CKD (chronic kidney disease), stage III     creatinin-1.44 in 1/09; 1.51 in 1/10  . Type II diabetes mellitus     type 2   Past Surgical History  Procedure Laterality Date  . Pacemaker removal  11/18/05    Enterococcal infection  . Total hip arthroplasty Right 07/2005  . Knee arthroscopy Right 1980's?  . Colonoscopy w/ polypectomy  2009  . Cardioversion N/A 08/20/2012    Procedure: TEE GUIDED CARDIOVERSION;  Surgeon: Yehuda Savannah, MD;  Location: [facility];  Service: Cardiovascular;  Laterality: N/A;  To be done @ bedside  . Tee without cardioversion N/A 08/20/2012    Procedure: TRANSESOPHAGEAL  ECHOCARDIOGRAM (TEE);  Surgeon: Yehuda Savannah, MD;  Location: [facility];  Service: Cardiovascular;  Laterality: N/A;  . Bi-ventricular implantable cardioverter defibrillator  (crt-d)  01/24/2013  . A-v cardiac pacemaker insertion  12/05    Biventricular pacemaker/AICD  . Abdominal hysterectomy  1990/92    Initial partial hysterectomy followed by BSO  . Tubal ligation  1980's  . Cardiac catheterization    . Colonoscopy with esophagogastroduodenoscopy (egd) N/A 06/10/2013    Procedure: COLONOSCOPY WITH ESOPHAGOGASTRODUODENOSCOPY (EGD);  Surgeon: Rogene Houston, MD;  Location: [facility];  Service: Endoscopy;  Laterality: N/A;  925  . Tee without cardioversion N/A 06/20/2013    Procedure: TRANSESOPHAGEAL ECHOCARDIOGRAM (TEE);  Surgeon: Dorothy Spark,  MD;  Location: [place];  Service: Cardiovascular;  Laterality: N/A;  . Cardioversion N/A 06/20/2013    Procedure: CARDIOVERSION;  Surgeon: Dorothy Spark, MD;  Location: [facility] ENDOSCOPY;  Service: Cardiovascular;  Laterality: N/A;  . Bi-ventricular implantable cardioverter defibrillator N/A 01/24/2013    Procedure: BI-VENTRICULAR IMPLANTABLE CARDIOVERTER DEFIBRILLATOR  (CRT-D);  Surgeon: Evans Lance, MD;  Location: [facility] CATH LAB;  Service: Cardiovascular;  Laterality: N/A;  . Right heart catheterization N/A 04/18/2014    Procedure: RIGHT HEART CATH;  Surgeon: Larey Dresser, MD;  Location: [facility] CATH LAB;  Service: Cardiovascular;  Laterality: N/A;  . Esophagogastroduodenoscopy N/A 10/21/2014    Procedure: ESOPHAGOGASTRODUODENOSCOPY (EGD);  Surgeon: Inda Castle, MD;  Location: [place];  Service: Endoscopy;  Laterality: N/A;  . Colonoscopy Left 10/24/2014    Procedure: COLONOSCOPY;  Surgeon: Carol Ada, MD;  Location: [facility] ENDOSCOPY;  Service: Endoscopy;  Laterality: Left;  Freda Munro capsule study N/A 10/24/2014    Procedure: GIVENS CAPSULE STUDY;  Surgeon: Carol Ada, MD;  Location: [facility] ENDOSCOPY;  Service: Endoscopy;  Laterality: N/A;   Family History  Problem Relation Age of Onset  . Hypertension Mother   . Diabetes Mother   . Coronary artery disease Father   . Diabetes Brother   . Hypertension Brother   . Lung cancer Brother   . Arthritis Other   . Diabetes Other   . Heart disease Other     female < 55   History  Substance Use Topics  . Smoking status: Former Smoker -- 12 years    Types: Cigarettes  . Smokeless tobacco: Never Used     Comment: 05/2013: Smokes an occasional cigarette.  Says that she doesn't inhale.  . Alcohol Use: No   OB History    No data available     Review of Systems  All other systems reviewed and are negative.     Allergies  Review of patient's allergies indicates no known allergies.  Home Medications   Prior to Admission medications    Medication Sig Start Date End Date Taking? Authorizing Provider  amiodarone (PACERONE) 200 MG tablet Take 1 tablet (200 mg total) by mouth daily. 02/08/14   Imogene Burn, PA-C  carvedilol (COREG) 3.125 MG tablet Take 3 tablets (9.375 mg total) by mouth 2 (two) times daily with a meal. 10/31/14   Larey Dresser, MD  cetirizine (ZYRTEC) 10 MG tablet Take 10 mg by mouth daily as needed for allergies.     Historical Provider, MD  colchicine 0.6 MG tablet Take 0.6 mg by mouth 2 (two) times daily.  12/20/13   Historical Provider, MD  digoxin (LANOXIN) 0.125 MG tablet Take 0.5 tablets (0.0625 mg total) by mouth every other day. 10/26/14   Rushil Sherrye Payor, MD  feeding  supplement, ENSURE ENLIVE, (ENSURE ENLIVE) LIQD Take 237 mLs by mouth 2 (two) times daily between meals. 10/26/14   Rushil Sherrye Payor, MD  ferrous sulfate 324 (65 FE) MG TBEC Take 1 tablet (325 mg total) by mouth 2 (two) times daily. 12/02/14   Orson Eva, MD  furosemide (LASIX) 40 MG tablet TAKE 1 AND 1/2 TABLETS BY MOUTH TWICE DAILY. 11/22/14   Jolaine Artist, MD  isosorbide mononitrate (IMDUR) 30 MG 24 hr tablet Take 1 tablet (30 mg total) by mouth daily. 10/26/14   Rushil Sherrye Payor, MD  levothyroxine (SYNTHROID, LEVOTHROID) 25 MCG tablet Take 1 tablet (25 mcg total) by mouth daily before breakfast. 10/26/14   Riccardo Dubin, MD  metoCLOPramide (REGLAN) 5 MG tablet Take 5 mg by mouth 4 (four) times daily.    Historical Provider, MD  ondansetron (ZOFRAN) 4 MG tablet Take 4 mg by mouth every 4 (four) hours as needed for nausea or vomiting.    Historical Provider, MD  oxyCODONE-acetaminophen (PERCOCET) 10-325 MG per tablet Take 1 tablet by mouth every 6 (six) hours as needed for pain.  12/09/13   Historical Provider, MD  polyethylene glycol powder (GLYCOLAX/MIRALAX) powder Take 1 Container by mouth daily as needed for mild constipation.  11/14/14   Historical Provider, MD  pravastatin (PRAVACHOL) 40 MG tablet Take 80 mg by mouth at bedtime.     Historical  Provider, MD  warfarin (COUMADIN) 2.5 MG tablet Take 0.5 tablets (1.25 mg total) by mouth daily at 6 PM. 10/26/14   Rushil Sherrye Payor, MD   Triage vitals: BP 159/98 mmHg  Pulse 85  Temp(Src) 100.9 F (38.3 C)  Resp 20  Ht 5\' 4"  (1.626 m)  Wt 150 lb (68.04 kg)  BMI 25.73 kg/m2  SpO2 94% Physical Exam  Constitutional: She is oriented to person, place, and time. She appears well-developed and well-nourished. No distress.  Tired appearing but not toxic  HENT:  Head: Normocephalic.  Eyes: Conjunctivae are normal. Pupils are equal, round, and reactive to light. No scleral icterus.  Neck: Normal range of motion. Neck supple. No thyromegaly present.  Cardiovascular: Normal rate and regular rhythm.  Exam reveals no gallop and no friction rub.   No murmur heard. Pulmonary/Chest: Effort normal and breath sounds normal. No respiratory distress. She has no wheezes. She has no rales.  Abdominal: Soft. Bowel sounds are normal. She exhibits no distension. There is no tenderness. There is no rebound.  Musculoskeletal: Normal range of motion.  Neurological: She is alert and oriented to person, place, and time.  Skin: Skin is warm and dry. No rash noted.  Psychiatric: She has a normal mood and affect. Her behavior is normal.  Nursing note and vitals reviewed.   ED Course  Procedures  DIAGNOSTIC STUDIES: Oxygen Saturation is 94% on RA, low by my interpretation.  COORDINATION OF CARE:  10:00 PM Discussed treatment plan which includes blood work, CXR, urinalysis  with pt at bedside and pt agreed to plan.   Labs Review Labs Reviewed  CBC WITH DIFFERENTIAL/PLATELET - Abnormal; Notable for the following:    RBC 3.23 (*)    Hemoglobin 10.1 (*)    HCT 30.7 (*)    RDW 15.7 (*)    Neutrophils Relative % 86 (*)    Neutro Abs 8.3 (*)    Lymphocytes Relative 9 (*)    All other components within normal limits  BASIC METABOLIC PANEL - Abnormal; Notable for the following:    Chloride 99 (*)  Glucose,  Bld 193 (*)    BUN 52 (*)    Creatinine, Ser 2.11 (*)    GFR calc non Af Amer 22 (*)    GFR calc Af Amer 26 (*)    All other components within normal limits  URINALYSIS, ROUTINE W REFLEX MICROSCOPIC (NOT AT [facility]) - Abnormal; Notable for the following:    Hgb urine dipstick SMALL (*)    All other components within normal limits  URINE MICROSCOPIC-ADD ON - Abnormal; Notable for the following:    Bacteria, UA MANY (*)    All other components within normal limits  BASIC METABOLIC PANEL - Abnormal; Notable for the following:    Glucose, Bld 279 (*)    BUN 53 (*)    Creatinine, Ser 2.18 (*)    Calcium 8.5 (*)    GFR calc non Af Amer 22 (*)    GFR calc Af Amer 25 (*)    All other components within normal limits  CBC - Abnormal; Notable for the following:    WBC 11.1 (*)    RBC 2.64 (*)    Hemoglobin 8.4 (*)    HCT 25.4 (*)    RDW 16.3 (*)    Platelets 143 (*)    All other components within normal limits  PROTIME-INR - Abnormal; Notable for the following:    Prothrombin Time 17.6 (*)    All other components within normal limits  PROTIME-INR - Abnormal; Notable for the following:    Prothrombin Time 15.9 (*)    All other components within normal limits  DIGOXIN LEVEL - Abnormal; Notable for the following:    Digoxin Level 0.4 (*)    All other components within normal limits  GLUCOSE, CAPILLARY - Abnormal; Notable for the following:    Glucose-Capillary 218 (*)    All other components within normal limits  GLUCOSE, CAPILLARY - Abnormal; Notable for the following:    Glucose-Capillary 116 (*)    All other components within normal limits  BASIC METABOLIC PANEL - Abnormal; Notable for the following:    Glucose, Bld 116 (*)    BUN 43 (*)    Creatinine, Ser 1.78 (*)    Calcium 8.8 (*)    GFR calc non Af Amer 28 (*)    GFR calc Af Amer 32 (*)    All other components within normal limits  PROTIME-INR - Abnormal; Notable for the following:    Prothrombin Time 17.3 (*)    All other  components within normal limits  CBC - Abnormal; Notable for the following:    RBC 2.86 (*)    Hemoglobin 9.0 (*)    HCT 27.4 (*)    RDW 16.2 (*)    Platelets 136 (*)    All other components within normal limits  GLUCOSE, CAPILLARY - Abnormal; Notable for the following:    Glucose-Capillary 157 (*)    All other components within normal limits  GLUCOSE, CAPILLARY - Abnormal; Notable for the following:    Glucose-Capillary 138 (*)    All other components within normal limits  GLUCOSE, CAPILLARY - Abnormal; Notable for the following:    Glucose-Capillary 330 (*)    All other components within normal limits  CULTURE, BLOOD (ROUTINE X 2)  CULTURE, BLOOD (ROUTINE X 2)  URINE CULTURE  MRSA PCR SCREENING  LACTIC ACID, PLASMA  GLUCOSE, CAPILLARY  I-STAT CG4 LACTIC ACID, ED    Imaging Review No results found.   Dg Chest 2 View  12/06/2014  CLINICAL DATA:  Acute onset of shortness of breath. Initial encounter.  EXAM: CHEST  2 VIEW  COMPARISON:  Chest radiograph performed 11/30/2014  FINDINGS: The lungs are well-aerated. Trace fluid is suggested along the right minor fissure. Minimal bibasilar atelectasis is noted. There is no evidence of pleural effusion or pneumothorax.  The heart is borderline enlarged. A pacemaker/AICD is noted at the right chest wall, with leads ending at the right atrium, right ventricle and coronary sinus. No acute osseous abnormalities are seen.  IMPRESSION: Trace fluid again suggested along the right minor fissure. Borderline cardiomegaly. Minimal bibasilar atelectasis noted.   Electronically Signed   By: Garald Balding M.D.   On: 12/06/2014 23:03    EKG Interpretation None      MDM   Final diagnoses:  SOB (shortness of breath)  Rigors UTI  69yF with rigors. Febrile. Many bacteria on UA but not much additional ancillary findings to suggest UA. No source of fever otherwise. May be viral illness. Blood cultures sent. Admit.   I personally preformed the  services scribed in my presence. The recorded information has been reviewed is accurate. Virgel Manifold, MD.   Virgel Manifold, MD 12/08/14 [phone]

## 2014-12-06 NOTE — ED Notes (Signed)
Patient c/o shortness of breath and shaking started this evening. Patient c/o being cold.

## 2014-12-06 NOTE — ED Notes (Signed)
Patient transported to X-ray 

## 2014-12-07 DIAGNOSIS — E1165 Type 2 diabetes mellitus with hyperglycemia: Secondary | ICD-10-CM | POA: Diagnosis present

## 2014-12-07 DIAGNOSIS — Z86711 Personal history of pulmonary embolism: Secondary | ICD-10-CM | POA: Diagnosis not present

## 2014-12-07 DIAGNOSIS — K219 Gastro-esophageal reflux disease without esophagitis: Secondary | ICD-10-CM | POA: Diagnosis present

## 2014-12-07 DIAGNOSIS — I482 Chronic atrial fibrillation: Secondary | ICD-10-CM | POA: Diagnosis not present

## 2014-12-07 DIAGNOSIS — I330 Acute and subacute infective endocarditis: Secondary | ICD-10-CM | POA: Diagnosis present

## 2014-12-07 DIAGNOSIS — J438 Other emphysema: Secondary | ICD-10-CM

## 2014-12-07 DIAGNOSIS — N184 Chronic kidney disease, stage 4 (severe): Secondary | ICD-10-CM | POA: Diagnosis present

## 2014-12-07 DIAGNOSIS — M1990 Unspecified osteoarthritis, unspecified site: Secondary | ICD-10-CM | POA: Diagnosis present

## 2014-12-07 DIAGNOSIS — I429 Cardiomyopathy, unspecified: Secondary | ICD-10-CM | POA: Diagnosis present

## 2014-12-07 DIAGNOSIS — Z96641 Presence of right artificial hip joint: Secondary | ICD-10-CM | POA: Diagnosis present

## 2014-12-07 DIAGNOSIS — I129 Hypertensive chronic kidney disease with stage 1 through stage 4 chronic kidney disease, or unspecified chronic kidney disease: Secondary | ICD-10-CM | POA: Diagnosis present

## 2014-12-07 DIAGNOSIS — T827XXS Infection and inflammatory reaction due to other cardiac and vascular devices, implants and grafts, sequela: Secondary | ICD-10-CM | POA: Diagnosis not present

## 2014-12-07 DIAGNOSIS — N390 Urinary tract infection, site not specified: Secondary | ICD-10-CM | POA: Diagnosis present

## 2014-12-07 DIAGNOSIS — R7881 Bacteremia: Secondary | ICD-10-CM

## 2014-12-07 DIAGNOSIS — T827XXA Infection and inflammatory reaction due to other cardiac and vascular devices, implants and grafts, initial encounter: Secondary | ICD-10-CM | POA: Diagnosis present

## 2014-12-07 DIAGNOSIS — Z7901 Long term (current) use of anticoagulants: Secondary | ICD-10-CM | POA: Diagnosis not present

## 2014-12-07 DIAGNOSIS — I5042 Chronic combined systolic (congestive) and diastolic (congestive) heart failure: Secondary | ICD-10-CM | POA: Diagnosis present

## 2014-12-07 DIAGNOSIS — E785 Hyperlipidemia, unspecified: Secondary | ICD-10-CM | POA: Diagnosis present

## 2014-12-07 DIAGNOSIS — I447 Left bundle-branch block, unspecified: Secondary | ICD-10-CM | POA: Diagnosis present

## 2014-12-07 DIAGNOSIS — I340 Nonrheumatic mitral (valve) insufficiency: Secondary | ICD-10-CM | POA: Diagnosis not present

## 2014-12-07 DIAGNOSIS — Z95810 Presence of automatic (implantable) cardiac defibrillator: Secondary | ICD-10-CM | POA: Diagnosis not present

## 2014-12-07 DIAGNOSIS — Z87891 Personal history of nicotine dependence: Secondary | ICD-10-CM | POA: Diagnosis not present

## 2014-12-07 DIAGNOSIS — Z1611 Resistance to penicillins: Secondary | ICD-10-CM | POA: Diagnosis not present

## 2014-12-07 DIAGNOSIS — J449 Chronic obstructive pulmonary disease, unspecified: Secondary | ICD-10-CM | POA: Diagnosis present

## 2014-12-07 DIAGNOSIS — R0602 Shortness of breath: Secondary | ICD-10-CM | POA: Diagnosis present

## 2014-12-07 DIAGNOSIS — Y831 Surgical operation with implant of artificial internal device as the cause of abnormal reaction of the patient, or of later complication, without mention of misadventure at the time of the procedure: Secondary | ICD-10-CM | POA: Diagnosis present

## 2014-12-07 DIAGNOSIS — R509 Fever, unspecified: Secondary | ICD-10-CM | POA: Diagnosis not present

## 2014-12-07 DIAGNOSIS — N289 Disorder of kidney and ureter, unspecified: Secondary | ICD-10-CM | POA: Diagnosis not present

## 2014-12-07 DIAGNOSIS — G4733 Obstructive sleep apnea (adult) (pediatric): Secondary | ICD-10-CM | POA: Diagnosis present

## 2014-12-07 DIAGNOSIS — T827XXD Infection and inflammatory reaction due to other cardiac and vascular devices, implants and grafts, subsequent encounter: Secondary | ICD-10-CM | POA: Diagnosis not present

## 2014-12-07 DIAGNOSIS — D638 Anemia in other chronic diseases classified elsewhere: Secondary | ICD-10-CM | POA: Diagnosis present

## 2014-12-07 DIAGNOSIS — T826XXA Infection and inflammatory reaction due to cardiac valve prosthesis, initial encounter: Secondary | ICD-10-CM | POA: Diagnosis not present

## 2014-12-07 DIAGNOSIS — B952 Enterococcus as the cause of diseases classified elsewhere: Secondary | ICD-10-CM | POA: Diagnosis present

## 2014-12-07 DIAGNOSIS — I480 Paroxysmal atrial fibrillation: Secondary | ICD-10-CM | POA: Diagnosis present

## 2014-12-07 DIAGNOSIS — E1122 Type 2 diabetes mellitus with diabetic chronic kidney disease: Secondary | ICD-10-CM | POA: Diagnosis present

## 2014-12-07 LAB — PROTIME-INR
INR: 1.26 (ref 0.00–1.49)
INR: 1.44 (ref 0.00–1.49)
PROTHROMBIN TIME: 15.9 s — AB (ref 11.6–15.2)
PROTHROMBIN TIME: 17.6 s — AB (ref 11.6–15.2)

## 2014-12-07 LAB — BASIC METABOLIC PANEL
ANION GAP: 11 (ref 5–15)
BUN: 53 mg/dL — AB (ref 6–20)
CALCIUM: 8.5 mg/dL — AB (ref 8.9–10.3)
CO2: 26 mmol/L (ref 22–32)
CREATININE: 2.18 mg/dL — AB (ref 0.44–1.00)
Chloride: 101 mmol/L (ref 101–111)
GFR calc non Af Amer: 22 mL/min — ABNORMAL LOW (ref >60)
GFR, EST AFRICAN AMERICAN: 25 mL/min — AB (ref >60)
Glucose, Bld: 279 mg/dL — ABNORMAL HIGH (ref 65–99)
Potassium: 3.8 mmol/L (ref 3.5–5.1)
Sodium: 138 mmol/L (ref 135–145)

## 2014-12-07 LAB — CBC
HCT: 25.4 % — ABNORMAL LOW (ref 36.0–46.0)
Hemoglobin: 8.4 g/dL — ABNORMAL LOW (ref 12.0–15.0)
MCH: 31.8 pg (ref 26.0–34.0)
MCHC: 33.1 g/dL (ref 30.0–36.0)
MCV: 96.2 fL (ref 78.0–100.0)
PLATELETS: 143 10*3/uL — AB (ref 150–400)
RBC: 2.64 MIL/uL — ABNORMAL LOW (ref 3.87–5.11)
RDW: 16.3 % — AB (ref 11.5–15.5)
WBC: 11.1 10*3/uL — ABNORMAL HIGH (ref 4.0–10.5)

## 2014-12-07 LAB — MRSA PCR SCREENING: MRSA by PCR: NEGATIVE

## 2014-12-07 LAB — DIGOXIN LEVEL: Digoxin Level: 0.4 ng/mL — ABNORMAL LOW (ref 0.8–2.0)

## 2014-12-07 LAB — GLUCOSE, CAPILLARY
GLUCOSE-CAPILLARY: 75 mg/dL (ref 65–99)
Glucose-Capillary: 218 mg/dL — ABNORMAL HIGH (ref 65–99)
Glucose-Capillary: 116 mg/dL — ABNORMAL HIGH (ref 65–99)
Glucose-Capillary: 157 mg/dL — ABNORMAL HIGH (ref 65–99)

## 2014-12-07 MED ORDER — WARFARIN - PHARMACIST DOSING INPATIENT
Status: DC
  Administered 2014-12-07 – 2014-12-09 (×2)

## 2014-12-07 MED ORDER — ISOSORBIDE MONONITRATE ER 30 MG PO TB24
30.0000 mg | ORAL_TABLET | Freq: Every day | ORAL | Status: DC
  Administered 2014-12-07 – 2014-12-15 (×9): 30 mg via ORAL
  Filled 2014-12-07 (×9): qty 1

## 2014-12-07 MED ORDER — ONDANSETRON HCL 4 MG PO TABS: 4.0000 mg | ORAL_TABLET | Freq: Four times a day (QID) | ORAL | Status: DC | PRN

## 2014-12-07 MED ORDER — AMIODARONE HCL 200 MG PO TABS
200.0000 mg | ORAL_TABLET | Freq: Every day | ORAL | Status: DC
  Administered 2014-12-07 – 2014-12-15 (×9): 200 mg via ORAL
  Filled 2014-12-07 (×9): qty 1

## 2014-12-07 MED ORDER — PRAVASTATIN SODIUM 80 MG PO TABS
80.0000 mg | ORAL_TABLET | Freq: Every day | ORAL | Status: DC
  Administered 2014-12-07 – 2014-12-14 (×9): 80 mg via ORAL
  Filled 2014-12-07 (×5): qty 1
  Filled 2014-12-07: qty 2
  Filled 2014-12-07 (×2): qty 1
  Filled 2014-12-07: qty 2
  Filled 2014-12-07: qty 1

## 2014-12-07 MED ORDER — CARVEDILOL 6.25 MG PO TABS
9.3750 mg | ORAL_TABLET | Freq: Two times a day (BID) | ORAL | Status: DC
  Administered 2014-12-07 – 2014-12-15 (×18): 9.375 mg via ORAL
  Filled 2014-12-07 (×6): qty 1
  Filled 2014-12-07: qty 3
  Filled 2014-12-07 (×5): qty 1
  Filled 2014-12-07: qty 3
  Filled 2014-12-07: qty 1
  Filled 2014-12-07 (×2): qty 3
  Filled 2014-12-07 (×3): qty 1

## 2014-12-07 MED ORDER — GLIMEPIRIDE 2 MG PO TABS
2.0000 mg | ORAL_TABLET | Freq: Every day | ORAL | Status: DC
  Administered 2014-12-07 – 2014-12-14 (×8): 2 mg via ORAL
  Filled 2014-12-07 (×9): qty 1

## 2014-12-07 MED ORDER — CETYLPYRIDINIUM CHLORIDE 0.05 % MT LIQD
7.0000 mL | Freq: Two times a day (BID) | OROMUCOSAL | Status: DC
  Administered 2014-12-07 – 2014-12-15 (×13): 7 mL via OROMUCOSAL

## 2014-12-07 MED ORDER — METOCLOPRAMIDE HCL 5 MG PO TABS
5.0000 mg | ORAL_TABLET | Freq: Four times a day (QID) | ORAL | Status: DC
  Administered 2014-12-07 – 2014-12-15 (×33): 5 mg via ORAL
  Filled 2014-12-07 (×35): qty 1

## 2014-12-07 MED ORDER — ENSURE ENLIVE PO LIQD
237.0000 mL | Freq: Two times a day (BID) | ORAL | Status: DC
  Administered 2014-12-07 – 2014-12-15 (×14): 237 mL via ORAL

## 2014-12-07 MED ORDER — WARFARIN SODIUM 5 MG PO TABS
2.5000 mg | ORAL_TABLET | Freq: Once | ORAL | Status: AC
  Administered 2014-12-07: 2.5 mg via ORAL
  Filled 2014-12-07: qty 1

## 2014-12-07 MED ORDER — LEVOFLOXACIN IN D5W 750 MG/150ML IV SOLN
750.0000 mg | Freq: Once | INTRAVENOUS | Status: AC
  Administered 2014-12-07: 750 mg via INTRAVENOUS
  Filled 2014-12-07: qty 150

## 2014-12-07 MED ORDER — DIGOXIN 0.0625 MG HALF TABLET
0.0625 mg | ORAL_TABLET | ORAL | Status: DC
  Administered 2014-12-07 – 2014-12-15 (×5): 0.0625 mg via ORAL
  Filled 2014-12-07 (×5): qty 1

## 2014-12-07 MED ORDER — INSULIN ASPART 100 UNIT/ML ~~LOC~~ SOLN: 0.0000 [IU] | Freq: Every day | SUBCUTANEOUS | Status: DC

## 2014-12-07 MED ORDER — SODIUM CHLORIDE 0.9 % IJ SOLN: 3.0000 mL | INTRAMUSCULAR | Status: DC | PRN

## 2014-12-07 MED ORDER — POLYETHYLENE GLYCOL 3350 17 GM/SCOOP PO POWD
1.0000 | Freq: Every day | ORAL | Status: DC | PRN
  Filled 2014-12-07: qty 255

## 2014-12-07 MED ORDER — ACETAMINOPHEN 325 MG PO TABS
650.0000 mg | ORAL_TABLET | Freq: Four times a day (QID) | ORAL | Status: DC | PRN
  Administered 2014-12-07 – 2014-12-10 (×2): 650 mg via ORAL
  Filled 2014-12-07 (×2): qty 2

## 2014-12-07 MED ORDER — VANCOMYCIN HCL 10 G IV SOLR
1500.0000 mg | Freq: Once | INTRAVENOUS | Status: AC
  Administered 2014-12-07: 1500 mg via INTRAVENOUS
  Filled 2014-12-07: qty 1500

## 2014-12-07 MED ORDER — POLYETHYLENE GLYCOL 3350 17 G PO PACK
17.0000 g | PACK | Freq: Every day | ORAL | Status: DC | PRN
  Filled 2014-12-07: qty 1

## 2014-12-07 MED ORDER — LEVOTHYROXINE SODIUM 25 MCG PO TABS
25.0000 ug | ORAL_TABLET | Freq: Every day | ORAL | Status: DC
  Administered 2014-12-07 – 2014-12-15 (×8): 25 ug via ORAL
  Filled 2014-12-07 (×10): qty 1

## 2014-12-07 MED ORDER — ONDANSETRON HCL 4 MG/2ML IJ SOLN: 4.0000 mg | Freq: Four times a day (QID) | INTRAMUSCULAR | Status: DC | PRN

## 2014-12-07 MED ORDER — ONDANSETRON HCL 4 MG PO TABS: 4.0000 mg | ORAL_TABLET | ORAL | Status: DC | PRN

## 2014-12-07 MED ORDER — WARFARIN - PHARMACIST DOSING INPATIENT: Freq: Every day | Status: DC

## 2014-12-07 MED ORDER — OXYCODONE-ACETAMINOPHEN 5-325 MG PO TABS
2.0000 | ORAL_TABLET | Freq: Four times a day (QID) | ORAL | Status: DC | PRN
  Administered 2014-12-08 – 2014-12-15 (×13): 2 via ORAL
  Filled 2014-12-07 (×13): qty 2

## 2014-12-07 MED ORDER — ACETAMINOPHEN 650 MG RE SUPP: 650.0000 mg | Freq: Four times a day (QID) | RECTAL | Status: DC | PRN

## 2014-12-07 MED ORDER — VANCOMYCIN HCL IN DEXTROSE 750-5 MG/150ML-% IV SOLN
750.0000 mg | INTRAVENOUS | Status: DC
  Administered 2014-12-08 – 2014-12-10 (×3): 750 mg via INTRAVENOUS
  Filled 2014-12-07 (×6): qty 150

## 2014-12-07 MED ORDER — WARFARIN SODIUM 2.5 MG PO TABS: 1.2500 mg | ORAL_TABLET | Freq: Every day | ORAL | Status: DC

## 2014-12-07 MED ORDER — SODIUM CHLORIDE 0.9 % IJ SOLN
3.0000 mL | Freq: Two times a day (BID) | INTRAMUSCULAR | Status: DC
  Administered 2014-12-07 – 2014-12-15 (×14): 3 mL via INTRAVENOUS

## 2014-12-07 MED ORDER — LEVOFLOXACIN IN D5W 750 MG/150ML IV SOLN: 750.0000 mg | INTRAVENOUS | Status: DC

## 2014-12-07 MED ORDER — COLCHICINE 0.6 MG PO TABS
0.6000 mg | ORAL_TABLET | Freq: Two times a day (BID) | ORAL | Status: DC
  Administered 2014-12-07 – 2014-12-10 (×9): 0.6 mg via ORAL
  Filled 2014-12-07 (×11): qty 1

## 2014-12-07 MED ORDER — SODIUM CHLORIDE 0.9 % IV SOLN: 250.0000 mL | INTRAVENOUS | Status: DC | PRN

## 2014-12-07 MED ORDER — SODIUM CHLORIDE 0.9 % IV SOLN: INTRAVENOUS | Status: DC

## 2014-12-07 MED ORDER — INSULIN ASPART 100 UNIT/ML ~~LOC~~ SOLN
0.0000 [IU] | Freq: Three times a day (TID) | SUBCUTANEOUS | Status: DC
  Administered 2014-12-08: 11 [IU] via SUBCUTANEOUS
  Administered 2014-12-08: 2 [IU] via SUBCUTANEOUS
  Administered 2014-12-09: 5 [IU] via SUBCUTANEOUS
  Administered 2014-12-10 – 2014-12-15 (×4): 3 [IU] via SUBCUTANEOUS

## 2014-12-07 MED ORDER — WARFARIN - PHARMACIST DOSING INPATIENT
Status: DC
  Administered 2014-12-07: 14:00:00

## 2014-12-07 NOTE — Progress Notes (Signed)
ANTIBIOTIC CONSULT NOTE-Preliminary  Pharmacy Consult for Levofloxacin Indication: Fever unknown origin  No Known Allergies  Patient Measurements: Height: 5\' 4"  (162.6 cm) Weight: 148 lb 2.4 oz (67.2 kg) IBW/kg (Calculated) : 54.7   Vital Signs: Temp: 100.6 F (38.1 C) (06/16 0000) Temp Source: Axillary (06/16 0000) BP: 142/58 mmHg (06/16 0000) Pulse Rate: 70 (06/16 0000)  Labs:  Recent Labs  12/06/14 2130  WBC 9.6  HGB 10.1*  PLT 179  CREATININE 2.11*    Estimated Creatinine Clearance: 23 mL/min (by C-G formula based on Cr of 2.11).  No results for input(s): VANCOTROUGH, VANCOPEAK, VANCORANDOM, GENTTROUGH, GENTPEAK, GENTRANDOM, TOBRATROUGH, TOBRAPEAK, TOBRARND, AMIKACINPEAK, AMIKACINTROU, AMIKACIN in the last 72 hours.   Microbiology: Recent Results (from the past 720 hour(s))  Rapid strep screen   (If patient has fever and/or without cough or runny nose)     Status: None   Collection Time: 11/30/14  8:05 AM  Result Value Ref Range Status   Streptococcus, Group A Screen (Direct) NEGATIVE NEGATIVE Final    Comment: (NOTE) A Rapid Antigen test may result negative if the antigen level in the sample is below the detection level of this test. The FDA has not cleared this test as a stand-alone test therefore the rapid antigen negative result has reflexed to a Group A Strep culture.   Culture, Group A Strep     Status: None   Collection Time: 11/30/14  8:05 AM  Result Value Ref Range Status   Strep A Culture Negative  Final    Comment: (NOTE) Performed At: [facility] [place], [place] [id number] Lindon Romp MD [id number]   MRSA PCR Screening     Status: Abnormal   Collection Time: 11/30/14  1:19 PM  Result Value Ref Range Status   MRSA by PCR POSITIVE (A) NEGATIVE Final    Comment:        The GeneXpert MRSA Assay (FDA approved for NASAL specimens only), is one component of a comprehensive MRSA colonization surveillance  program. It is not intended to diagnose MRSA infection nor to guide or monitor treatment for MRSA infections. RESULT CALLED TO, READ BACK BY AND VERIFIED WITH: BOWIE,C RN @ 0017 11/30/14 LEONARDA,   Respiratory virus panel     Status: Abnormal   Collection Time: 11/30/14  1:31 PM  Result Value Ref Range Status   Respiratory Syncytial Virus A Negative Negative Final   Respiratory Syncytial Virus B Negative Negative Final   Influenza A Negative Negative Final   Influenza B Negative Negative Final   Parainfluenza 1 Negative Negative Final   Parainfluenza 2 Negative Negative Final   Parainfluenza 3 Negative Negative Final   Metapneumovirus Negative Negative Final   Rhinovirus Positive (A) Negative Final   Adenovirus Negative Negative Final    Comment: (NOTE) Performed At: [facility] [place], [place] [id number] Lindon Romp MD [id number]   Urine culture     Status: None   Collection Time: 11/30/14  1:33 PM  Result Value Ref Range Status   Specimen Description URINE, RANDOM  Final   Special Requests NONE  Final   Colony Count   Final    9,000 COLONIES/ML Performed at [facility]    Culture   Final    INSIGNIFICANT GROWTH Performed at [facility]    Report Status 12/01/2014 FINAL  Final  Culture, blood (routine x 2)     Status: None (Preliminary result)   Collection Time: 11/30/14  7:12 PM  Result Value Ref Range Status   Specimen Description BLOOD RIGHT ARM  Final   Special Requests   Final    BOTTLES DRAWN AEROBIC AND ANAEROBIC 10CC BLUE 5CC RED   Culture   Final    GRAM POSITIVE COCCI IN CLUSTERS Note: Gram Stain Report Called to,Read Back By and Verified With: CECILIA POWELL 12/04/14 1425 BY SMITHERSJ Performed at [facility]    Report Status PENDING  Incomplete  Culture, blood (routine x 2)     Status: None (Preliminary result)   Collection Time: 11/30/14  7:15 PM  Result Value Ref Range Status   Specimen  Description BLOOD LEFT WRIST  Final   Special Requests BOTTLES DRAWN AEROBIC AND ANAEROBIC 10CC EACH  Final   Culture   Final    GRAM POSITIVE COCCI IN CLUSTERS Performed at [facility]    Report Status PENDING  Incomplete  Blood culture (routine x 2)     Status: None (Preliminary result)   Collection Time: 12/06/14 10:00 PM  Result Value Ref Range Status   Specimen Description BLOOD RIGHT ARM  Final   Special Requests BOTTLES DRAWN AEROBIC AND ANAEROBIC 10CC EACH  Final   Culture PENDING  Incomplete   Report Status PENDING  Incomplete  Blood culture (routine x 2)     Status: None (Preliminary result)   Collection Time: 12/06/14 10:06 PM  Result Value Ref Range Status   Specimen Description BLOOD RIGHT HAND  Final   Special Requests BOTTLES DRAWN AEROBIC AND ANAEROBIC 10CC EACH  Final   Culture PENDING  Incomplete   Report Status PENDING  Incomplete    Medical History: Past Medical History  Diagnosis Date  . Cardiomyopathy, nonischemic     a. 1999 nl cath;  b. 12/05 [employer] [place];  c. 10/2005 ICD extraction 2/2 enterococcus bacteremia and Veg on RV lead;  c. 05/2008 low risk Myoview (scarring w/ some evidence of inf ischemia);  d. 11/2012 Echo: EF 15-20%;  e. 01/2013 s/p MDT [place] [facility] CRT D, ser # [id number];  f. 05/2013 Echo: EF 15%.  . Enterococcal infection     a. 10/2005 - AICD-explanted  . Pulmonary embolism     a. 07/2005 after total right hip arthroplasty  . Hilar density     a. infrahilar mass/adenopathy on CT scan 5/07; subsequently  resolved  . LBBB (left bundle branch block)   . GERD (gastroesophageal reflux disease)   . Hyperlipidemia   . Hypertension   . Tobacco abuse     a. discontinued in 1997, and then resumed  . Urinary incontinence   . Anemia     a. mild/chronic  . Villous adenoma of colon     a. tubovillous adenomatous polyp with focal high grade dysplasia; presented with hematochezia - followed by Dr. Laural Golden.  . Implantable  cardioverter-defibrillator-CRT- Mdt     a.  01/2013 s/p MDT [place] [facility] CRT D, ser # [id number]  . Chronic systolic CHF (congestive heart failure)     a. 11/2012 Echo: EF 15-20%;  b. 05/2013 TEE EF 15%.  . Pneumonia 07/2005  . Obstructive sleep apnea     a. mild-did not tolerate CPAP (01/24/2013)  . History of blood transfusion   . Degenerative joint disease     of knees, shoulder, and hips  . PAF (paroxysmal atrial fibrillation)     a. 07/2012 s/p TEE/DCCV;  b. chronic coumadin;  c. 05/2013 Recurrent Afib->TEE/DCCV and amio initiation.  . CKD (chronic kidney  disease), stage III     creatinin-1.44 in 1/09; 1.51 in 1/10  . Type II diabetes mellitus     type 2    Medications:  Ceftriaxone 1 Gm IV in the ED  Assessment: 69 yo female seen in the ED with SOB, fever, chills, mild cough x 1 day duration. Received ceftriaxone for possible UTI. Levaquin to be started for broader abx coverage for FUO.  Goal of Therapy:  Eradicate infection  Plan:  Preliminary review of pertinent patient information completed.  Protocol will be initiated with a one-time dose of levofloxacin 750 mg IV.  Forestine Na clinical pharmacist will complete review during morning rounds to assess patient and finalize treatment regimen.  Norberto Sorenson, RPH 12/07/2014,1:15 AM

## 2014-12-07 NOTE — Consult Note (Signed)
[facility] consult:  Enterococcal bacteremia, 2/2 blood cultures from 6/9.  Has received one dose of levaquin, ceftriaxone.    I will put in for vancomycin pending sensitivities.  History of ICD vegetation with Enterococcus.  She will need cardiology input with TEE.    Call ID for any questions.  thanks

## 2014-12-07 NOTE — Progress Notes (Signed)
ANTICOAGULATION CONSULT NOTE - Initial Consult  Pharmacy Consult for Coumadin (chronic Rx PTA) Indication: H/O PE  No Known Allergies  Patient Measurements: Height: 5\' 4"  (162.6 cm) Weight: 148 lb 1.6 oz (67.178 kg) IBW/kg (Calculated) : 54.7  Vital Signs: Temp: 99.1 F (37.3 C) (06/16 0500) Temp Source: Oral (06/16 0500) BP: 107/46 mmHg (06/16 0500) Pulse Rate: 71 (06/16 0500)  Labs:  Recent Labs  12/06/14 2130 12/06/14 2131 12/07/14 0628  HGB 10.1*  --  8.4*  HCT 30.7*  --  25.4*  PLT 179  --  143*  LABPROT  --  15.9* 17.6*  INR  --  1.26 1.44  CREATININE 2.11*  --  2.18*    Estimated Creatinine Clearance: 22.3 mL/min (by C-G formula based on Cr of 2.18).   Medical History: Past Medical History  Diagnosis Date  . Cardiomyopathy, nonischemic     a. 1999 nl cath;  b. 12/05 [facility];  c. 10/2005 ICD extraction 2/2 enterococcus bacteremia and Veg on RV lead;  c. 05/2008 low risk Myoview (scarring w/ some evidence of inf ischemia);  d. 11/2012 Echo: EF 15-20%;  e. 01/2013 s/p MDT [facility] CRT D, ser # [id number];  f. 05/2013 Echo: EF 15%.  . Enterococcal infection     a. 10/2005 - AICD-explanted  . Pulmonary embolism     a. 07/2005 after total right hip arthroplasty  . Hilar density     a. infrahilar mass/adenopathy on CT scan 5/07; subsequently  resolved  . LBBB (left bundle branch block)   . GERD (gastroesophageal reflux disease)   . Hyperlipidemia   . Hypertension   . Tobacco abuse     a. discontinued in 1997, and then resumed  . Urinary incontinence   . Anemia     a. mild/chronic  . Villous adenoma of colon     a. tubovillous adenomatous polyp with focal high grade dysplasia; presented with hematochezia - followed by Dr. Laural Golden.  . Implantable cardioverter-defibrillator-CRT- Mdt     a.  01/2013 s/p MDT [facility] CRT D, ser # [id number]  . Chronic systolic CHF (congestive heart failure)     a. 11/2012 Echo: EF 15-20%;  b. 05/2013 TEE EF 15%.  . Pneumonia  07/2005  . Obstructive sleep apnea     a. mild-did not tolerate CPAP (01/24/2013)  . History of blood transfusion   . Degenerative joint disease     of knees, shoulder, and hips  . PAF (paroxysmal atrial fibrillation)     a. 07/2012 s/p TEE/DCCV;  b. chronic coumadin;  c. 05/2013 Recurrent Afib->TEE/DCCV and amio initiation.  . CKD (chronic kidney disease), stage III     creatinin-1.44 in 1/09; 1.51 in 1/10  . Type II diabetes mellitus     type 2    Medications:  Prescriptions prior to admission  Medication Sig Dispense Refill Last Dose  . amiodarone (PACERONE) 200 MG tablet Take 1 tablet (200 mg total) by mouth daily. 90 tablet 3 12/06/2014 at Unknown time  . carvedilol (COREG) 3.125 MG tablet Take 3 tablets (9.375 mg total) by mouth 2 (two) times daily with a meal. 180 tablet 2 12/06/2014 at 800  . colchicine 0.6 MG tablet Take 0.6 mg by mouth 2 (two) times daily.    12/06/2014 at Unknown time  . digoxin (LANOXIN) 0.125 MG tablet Take 0.5 tablets (0.0625 mg total) by mouth every other day. 30 tablet 3 12/05/2014 at Unknown time  . feeding supplement, ENSURE ENLIVE, (ENSURE ENLIVE) LIQD  Take 237 mLs by mouth 2 (two) times daily between meals. 237 mL 12 12/05/2014 at Unknown time  . ferrous sulfate 324 (65 FE) MG TBEC Take 1 tablet (325 mg total) by mouth 2 (two) times daily. 60 tablet  12/06/2014 at Unknown time  . furosemide (LASIX) 40 MG tablet TAKE 1 AND 1/2 TABLETS BY MOUTH TWICE DAILY. 90 tablet 3 12/06/2014 at Unknown time  . glimepiride (AMARYL) 2 MG tablet Take 2 mg by mouth daily.   12/06/2014 at Unknown time  . isosorbide mononitrate (IMDUR) 30 MG 24 hr tablet Take 1 tablet (30 mg total) by mouth daily. 30 tablet 0 12/06/2014 at Unknown time  . levothyroxine (SYNTHROID, LEVOTHROID) 25 MCG tablet Take 1 tablet (25 mcg total) by mouth daily before breakfast. 30 tablet 0 12/06/2014 at Unknown time  . metoCLOPramide (REGLAN) 5 MG tablet Take 5 mg by mouth 4 (four) times daily.   12/06/2014 at  Unknown time  . oxyCODONE-acetaminophen (PERCOCET) 10-325 MG per tablet Take 1 tablet by mouth every 6 (six) hours as needed for pain.    12/05/2014 at Unknown time  . pravastatin (PRAVACHOL) 40 MG tablet Take 80 mg by mouth at bedtime.    12/05/2014 at Unknown time  . warfarin (COUMADIN) 2.5 MG tablet Take 0.5 tablets (1.25 mg total) by mouth daily at 6 PM. 10 tablet 0 12/06/2014 at 1800  . cetirizine (ZYRTEC) 10 MG tablet Take 10 mg by mouth daily as needed for allergies.    unknown  . ondansetron (ZOFRAN) 4 MG tablet Take 4 mg by mouth every 4 (four) hours as needed for nausea or vomiting.   unknown  . polyethylene glycol powder (GLYCOLAX/MIRALAX) powder Take 1 Container by mouth daily as needed for mild constipation.    unknown    Assessment: 69yo female on chronic Coumadin PTA for h/o PE.  INR is SUBtherapeutic on admission.  Home dose is listed above.  No bleeding reported.   Goal of Therapy:  INR 2-3 Monitor platelets by anticoagulation protocol: Yes   Plan:  Coumadin 2.5mg  today x 1 (dose increase to boost INR) INR daily.  Hart Robinsons A 12/07/2014,11:09 AM

## 2014-12-07 NOTE — Progress Notes (Signed)
ANTIBIOTIC CONSULT NOTE - INITIAL  Pharmacy Consult for Vancomycin and Levaquin Indication: bacteremia  No Known Allergies  Patient Measurements: Height: 5\' 4"  (162.6 cm) Weight: 148 lb 1.6 oz (67.178 kg) IBW/kg (Calculated) : 54.7  Vital Signs: Temp: 99.1 F (37.3 C) (06/16 0500) Temp Source: Oral (06/16 0500) BP: 107/46 mmHg (06/16 0500) Pulse Rate: 71 (06/16 0500) Intake/Output from previous day: 06/15 0701 - 06/16 0700 In: 270 [P.O.:120; IV Piggyback:150] Out: -  Intake/Output from this shift: Total I/O In: 243 [P.O.:240; I.V.:3] Out: -   Labs:  Recent Labs  12/06/14 2130 12/07/14 0628  WBC 9.6 11.1*  HGB 10.1* 8.4*  PLT 179 143*  CREATININE 2.11* 2.18*   Estimated Creatinine Clearance: 22.3 mL/min (by C-G formula based on Cr of 2.18). No results for input(s): VANCOTROUGH, VANCOPEAK, VANCORANDOM, GENTTROUGH, GENTPEAK, GENTRANDOM, TOBRATROUGH, TOBRAPEAK, TOBRARND, AMIKACINPEAK, AMIKACINTROU, AMIKACIN in the last 72 hours.   Microbiology: Recent Results (from the past 720 hour(s))  Rapid strep screen   (If patient has fever and/or without cough or runny nose)     Status: None   Collection Time: 11/30/14  8:05 AM  Result Value Ref Range Status   Streptococcus, Group A Screen (Direct) NEGATIVE NEGATIVE Final    Comment: (NOTE) A Rapid Antigen test may result negative if the antigen level in the sample is below the detection level of this test. The FDA has not cleared this test as a stand-alone test therefore the rapid antigen negative result has reflexed to a Group A Strep culture.   Culture, Group A Strep     Status: None   Collection Time: 11/30/14  8:05 AM  Result Value Ref Range Status   Strep A Culture Negative  Final    Comment: (NOTE) Performed At: [facility] [place], [place] [id number] Lindon Romp MD TK:[id number]   MRSA PCR Screening     Status: Abnormal   Collection Time: 11/30/14  1:19 PM  Result Value Ref Range  Status   MRSA by PCR POSITIVE (A) NEGATIVE Final    Comment:        The GeneXpert MRSA Assay (FDA approved for NASAL specimens only), is one component of a comprehensive MRSA colonization surveillance program. It is not intended to diagnose MRSA infection nor to guide or monitor treatment for MRSA infections. RESULT CALLED TO, READ BACK BY AND VERIFIED WITH: BOWIE,C RN @ 5170 11/30/14 LEONARDA,   Respiratory virus panel     Status: Abnormal   Collection Time: 11/30/14  1:31 PM  Result Value Ref Range Status   Respiratory Syncytial Virus A Negative Negative Final   Respiratory Syncytial Virus B Negative Negative Final   Influenza A Negative Negative Final   Influenza B Negative Negative Final   Parainfluenza 1 Negative Negative Final   Parainfluenza 2 Negative Negative Final   Parainfluenza 3 Negative Negative Final   Metapneumovirus Negative Negative Final   Rhinovirus Positive (A) Negative Final   Adenovirus Negative Negative Final    Comment: (NOTE) Performed At: [facility] [address] [place], [place] [id number] Lindon Romp MD PR:[id number]   Urine culture     Status: None   Collection Time: 11/30/14  1:33 PM  Result Value Ref Range Status   Specimen Description URINE, RANDOM  Final   Special Requests NONE  Final   Colony Count   Final    9,000 COLONIES/ML Performed at [facility]   Final    INSIGNIFICANT  GROWTH Performed at [facility]    Report Status 12/01/2014 FINAL  Final  Culture, blood (routine x 2)     Status: None (Preliminary result)   Collection Time: 11/30/14  7:12 PM  Result Value Ref Range Status   Specimen Description BLOOD RIGHT ARM  Final   Special Requests   Final    BOTTLES DRAWN AEROBIC AND ANAEROBIC 10CC BLUE 5CC RED   Culture   Final    ENTEROCOCCUS SPECIES Note: Gram Stain Report Called to,Read Back By and Verified With: CECILIA POWELL 12/04/14 1425 BY SMITHERSJ Performed at [facility]    Report Status PENDING  Incomplete  Culture, blood (routine x 2)     Status: None (Preliminary result)   Collection Time: 11/30/14  7:15 PM  Result Value Ref Range Status   Specimen Description BLOOD LEFT WRIST  Final   Special Requests BOTTLES DRAWN AEROBIC AND ANAEROBIC 10CC EACH  Final   Culture   Final    ENTEROCOCCUS SPECIES Performed at [facility]    Report Status PENDING  Incomplete  Blood culture (routine x 2)     Status: None (Preliminary result)   Collection Time: 12/06/14 10:00 PM  Result Value Ref Range Status   Specimen Description BLOOD RIGHT ARM  Final   Special Requests BOTTLES DRAWN AEROBIC AND ANAEROBIC 10CC EACH  Final   Culture PENDING  Incomplete   Report Status PENDING  Incomplete  Blood culture (routine x 2)     Status: None (Preliminary result)   Collection Time: 12/06/14 10:06 PM  Result Value Ref Range Status   Specimen Description BLOOD RIGHT HAND  Final   Special Requests BOTTLES DRAWN AEROBIC AND ANAEROBIC 10CC EACH  Final   Culture PENDING  Incomplete   Report Status PENDING  Incomplete  MRSA PCR Screening     Status: None   Collection Time: 12/07/14 12:25 AM  Result Value Ref Range Status   MRSA by PCR NEGATIVE NEGATIVE Final    Comment:        The GeneXpert MRSA Assay (FDA approved for NASAL specimens only), is one component of a comprehensive MRSA colonization surveillance program. It is not intended to diagnose MRSA infection nor to guide or monitor treatment for MRSA infections.     Medical History: Past Medical History  Diagnosis Date  . Cardiomyopathy, nonischemic     a. 1999 nl cath;  b. 12/05 [facility];  c. 10/2005 ICD extraction 2/2 enterococcus bacteremia and Veg on RV lead;  c. 05/2008 low risk Myoview (scarring w/ some evidence of inf ischemia);  d. 11/2012 Echo: EF 15-20%;  e. 01/2013 s/p MDT [facility] CRT D, ser # [id number];  f. 05/2013 Echo: EF 15%.  . Enterococcal infection     a. 10/2005 -  AICD-explanted  . Pulmonary embolism     a. 07/2005 after total right hip arthroplasty  . Hilar density     a. infrahilar mass/adenopathy on CT scan 5/07; subsequently  resolved  . LBBB (left bundle branch block)   . GERD (gastroesophageal reflux disease)   . Hyperlipidemia   . Hypertension   . Tobacco abuse     a. discontinued in 1997, and then resumed  . Urinary incontinence   . Anemia     a. mild/chronic  . Villous adenoma of colon     a. tubovillous adenomatous polyp with focal high grade dysplasia; presented with hematochezia - followed by Dr. Laural Golden.  . Implantable cardioverter-defibrillator-CRT- Mdt  a.  01/2013 s/p MDT [facility] CRT D, ser # [id number]  . Chronic systolic CHF (congestive heart failure)     a. 11/2012 Echo: EF 15-20%;  b. 05/2013 TEE EF 15%.  . Pneumonia 07/2005  . Obstructive sleep apnea     a. mild-did not tolerate CPAP (01/24/2013)  . History of blood transfusion   . Degenerative joint disease     of knees, shoulder, and hips  . PAF (paroxysmal atrial fibrillation)     a. 07/2012 s/p TEE/DCCV;  b. chronic coumadin;  c. 05/2013 Recurrent Afib->TEE/DCCV and amio initiation.  . CKD (chronic kidney disease), stage III     creatinin-1.44 in 1/09; 1.51 in 1/10  . Type II diabetes mellitus     type 2   Anti-infectives    Start     Dose/Rate Route Frequency Ordered Stop   12/08/14 1000  vancomycin (VANCOCIN) IVPB 750 mg/150 ml premix     750 mg 150 mL/hr over 60 Minutes Intravenous Every 24 hours 12/07/14 1115     12/07/14 1030  vancomycin (VANCOCIN) 1,500 mg in sodium chloride 0.9 % 500 mL IVPB     1,500 mg 250 mL/hr over 120 Minutes Intravenous  Once 12/07/14 0920     12/07/14 0100  levofloxacin (LEVAQUIN) IVPB 750 mg     750 mg 100 mL/hr over 90 Minutes Intravenous  Once 12/07/14 0053 12/07/14 0301   12/06/14 2300  cefTRIAXone (ROCEPHIN) 1 g in dextrose 5 % 50 mL IVPB     1 g 100 mL/hr over 30 Minutes Intravenous  Once 12/06/14 2254 12/06/14 2338      Assessment: 69yo female with enterococcal bacteremia, 2/2 blood cx from 6/9.  ID has added Vancomycin pending sensitivities (see ID note).  Pt has received a dose of Levaquin and Rocephin.  SCr elevated.  Normalized clcr ~20-10ml/min  Goal of Therapy:  Vancomycin trough level 15-20 mcg/ml Eradicate infection.  Plan:  Levaquin 750mg  IV q48hrs Vancomycin 1500mg  IV today x 1 then Vancomycin 750mg  IV q24hrs Monitor labs, renal fxn, cultures and sensitivities Deescalate ABX when appropriate F/U recommendations per ID  Hart Robinsons A 12/07/2014,11:15 AM

## 2014-12-07 NOTE — Progress Notes (Signed)
Chart reviewed and patient examined.   PE: Gen: thin frail appears comfortable CV: RRR no MGR No LE edema Respiratory: normal effort BS distant but clear Abdomen: flat soft +BS non-tender Neuro: awake alert oriented x3    Assessment/plan:  Enterococcal bacteremia: per blood culture drawn 11/30/14 during last hospitalization. Max temp 103 rectal last night.  Hx of ICD vegetation with Enterococcus. Vancomycin started per ID. Await sensitivities. Notified Cards as she will need TEE.   Chronic systolic and diastolic HF. Echo 09/2014 with EF 15% and grade 2 diastolic dysfunction. Appears compensated. On amiodarone, carvedilol, imdur. Continue. Holding lasix for now. BP slightly labile.   COPD: appears stable at baseline:   Afib: On dig. Chadscore 7. On coumadin. INR 1.44. Pharmacy to dose  CKD: stage IV. Current creatinine at baseline.   HTN: fair control   Diabetes: on oral agents. Will hold as appetite unreliable. Use SSI     [name]. Bohdan Macho, NP

## 2014-12-07 NOTE — H&P (Signed)
PCP:   Robert Bellow, MD   Chief Complaint:  fever  HPI: 69 yo female h/o chf, ckd, aicd, PE, on coumadin comes in with over one day of fever, chills and today rigors.  Pt has been having a mild cough but nothing too impressive.  No swelling in legs.  No rashes.  No chest pain or abdominal pain.  No dysuria or change in urinary flow.  Some mild sob, no nasal congestion.  No n/v/d.  No sick contacts.  No pain or swelling around her aicd site.  Her temp is over 102 in the ED, source unclear.  Asked to admit for fever.  Pt given rocephin in ED for possible uti.  No neck pain, vision changes.  Review of Systems:  Positive and negative as per HPI otherwise all other systems are negative  Past Medical History: Past Medical History  Diagnosis Date  . Cardiomyopathy, nonischemic     a. 1999 nl cath;  b. 12/05 [employer]ant [place];  c. 10/2005 ICD extraction 2/2 enterococcus bacteremia and Veg on RV lead;  c. 05/2008 low risk Myoview (scarring w/ some evidence of inf ischemia);  d. 11/2012 Echo: EF 15-20%;  e. 01/2013 s/p MDT [place] CRT D, ser # [id number];  f. 05/2013 Echo: EF 15%.  . Enterococcal infection     a. 10/2005 - AICD-explanted  . Pulmonary embolism     a. 07/2005 after total right hip arthroplasty  . Hilar density     a. infrahilar mass/adenopathy on CT scan 5/07; subsequently  resolved  . LBBB (left bundle branch block)   . GERD (gastroesophageal reflux disease)   . Hyperlipidemia   . Hypertension   . Tobacco abuse     a. discontinued in 1997, and then resumed  . Urinary incontinence   . Anemia     a. mild/chronic  . Villous adenoma of colon     a. tubovillous adenomatous polyp with focal high grade dysplasia; presented with hematochezia - followed by Dr. Laural Golden.  . Implantable cardioverter-defibrillator-CRT- Mdt     a.  01/2013 s/p MDT [place] CRT D, ser # [id number]  . Chronic systolic CHF (congestive heart failure)     a. 11/2012 Echo: EF 15-20%;  b. 05/2013 TEE EF 15%.  .  Pneumonia 07/2005  . Obstructive sleep apnea     a. mild-did not tolerate CPAP (01/24/2013)  . History of blood transfusion   . Degenerative joint disease     of knees, shoulder, and hips  . PAF (paroxysmal atrial fibrillation)     a. 07/2012 s/p TEE/DCCV;  b. chronic coumadin;  c. 05/2013 Recurrent Afib->TEE/DCCV and amio initiation.  . CKD (chronic kidney disease), stage III     creatinin-1.44 in 1/09; 1.51 in 1/10  . Type II diabetes mellitus     type 2   Past Surgical History  Procedure Laterality Date  . Pacemaker removal  11/18/05    Enterococcal infection  . Total hip arthroplasty Right 07/2005  . Knee arthroscopy Right 1980's?  . Colonoscopy w/ polypectomy  2009  . Cardioversion N/A 08/20/2012    Procedure: TEE GUIDED CARDIOVERSION;  Surgeon: Yehuda Savannah, MD;  Location: [facility];  Service: Cardiovascular;  Laterality: N/A;  To be done @ bedside  . Tee without cardioversion N/A 08/20/2012    Procedure: TRANSESOPHAGEAL ECHOCARDIOGRAM (TEE);  Surgeon: Yehuda Savannah, MD;  Location: [facility];  Service: Cardiovascular;  Laterality: N/A;  . Bi-ventricular implantable cardioverter defibrillator  (crt-d)  01/24/2013  .  A-v cardiac pacemaker insertion  12/05    Biventricular pacemaker/AICD  . Abdominal hysterectomy  1990/92    Initial partial hysterectomy followed by BSO  . Tubal ligation  1980's  . Cardiac catheterization    . Colonoscopy with esophagogastroduodenoscopy (egd) N/A 06/10/2013    Procedure: COLONOSCOPY WITH ESOPHAGOGASTRODUODENOSCOPY (EGD);  Surgeon: Rogene Houston, MD;  Location: [facility];  Service: Endoscopy;  Laterality: N/A;  925  . Tee without cardioversion N/A 06/20/2013    Procedure: TRANSESOPHAGEAL ECHOCARDIOGRAM (TEE);  Surgeon: Dorothy Spark, MD;  Location: [place];  Service: Cardiovascular;  Laterality: N/A;  . Cardioversion N/A 06/20/2013    Procedure: CARDIOVERSION;  Surgeon: Dorothy Spark, MD;  Location: [facility] ENDOSCOPY;  Service:  Cardiovascular;  Laterality: N/A;  . Bi-ventricular implantable cardioverter defibrillator N/A 01/24/2013    Procedure: BI-VENTRICULAR IMPLANTABLE CARDIOVERTER DEFIBRILLATOR  (CRT-D);  Surgeon: Evans Lance, MD;  Location: [facility] CATH LAB;  Service: Cardiovascular;  Laterality: N/A;  . Right heart catheterization N/A 04/18/2014    Procedure: RIGHT HEART CATH;  Surgeon: Larey Dresser, MD;  Location: [facility] CATH LAB;  Service: Cardiovascular;  Laterality: N/A;  . Esophagogastroduodenoscopy N/A 10/21/2014    Procedure: ESOPHAGOGASTRODUODENOSCOPY (EGD);  Surgeon: Inda Castle, MD;  Location: [place];  Service: Endoscopy;  Laterality: N/A;  . Colonoscopy Left 10/24/2014    Procedure: COLONOSCOPY;  Surgeon: Carol Ada, MD;  Location: [facility] ENDOSCOPY;  Service: Endoscopy;  Laterality: Left;  Freda Munro capsule study N/A 10/24/2014    Procedure: GIVENS CAPSULE STUDY;  Surgeon: Carol Ada, MD;  Location: [facility] ENDOSCOPY;  Service: Endoscopy;  Laterality: N/A;    Medications: Prior to Admission medications   Medication Sig Start Date End Date Taking? Authorizing Provider  amiodarone (PACERONE) 200 MG tablet Take 1 tablet (200 mg total) by mouth daily. 02/08/14  Yes Imogene Burn, PA-C  carvedilol (COREG) 3.125 MG tablet Take 3 tablets (9.375 mg total) by mouth 2 (two) times daily with a meal. 10/31/14  Yes Larey Dresser, MD  colchicine 0.6 MG tablet Take 0.6 mg by mouth 2 (two) times daily.  12/20/13  Yes Historical Provider, MD  digoxin (LANOXIN) 0.125 MG tablet Take 0.5 tablets (0.0625 mg total) by mouth every other day. 10/26/14  Yes Rushil Sherrye Payor, MD  feeding supplement, ENSURE ENLIVE, (ENSURE ENLIVE) LIQD Take 237 mLs by mouth 2 (two) times daily between meals. 10/26/14  Yes Rushil Sherrye Payor, MD  ferrous sulfate 324 (65 FE) MG TBEC Take 1 tablet (325 mg total) by mouth 2 (two) times daily. 12/02/14  Yes Orson Eva, MD  furosemide (LASIX) 40 MG tablet TAKE 1 AND 1/2 TABLETS BY MOUTH TWICE DAILY. 11/22/14  Yes  Shaune Pascal Bensimhon, MD  glimepiride (AMARYL) 2 MG tablet Take 2 mg by mouth daily. 12/05/14  Yes Historical Provider, MD  isosorbide mononitrate (IMDUR) 30 MG 24 hr tablet Take 1 tablet (30 mg total) by mouth daily. 10/26/14  Yes Rushil Sherrye Payor, MD  levothyroxine (SYNTHROID, LEVOTHROID) 25 MCG tablet Take 1 tablet (25 mcg total) by mouth daily before breakfast. 10/26/14  Yes Rushil Sherrye Payor, MD  metoCLOPramide (REGLAN) 5 MG tablet Take 5 mg by mouth 4 (four) times daily.   Yes Historical Provider, MD  oxyCODONE-acetaminophen (PERCOCET) 10-325 MG per tablet Take 1 tablet by mouth every 6 (six) hours as needed for pain.  12/09/13  Yes Historical Provider, MD  pravastatin (PRAVACHOL) 40 MG tablet Take 80 mg by mouth at bedtime.    Yes Historical  Provider, MD  warfarin (COUMADIN) 2.5 MG tablet Take 0.5 tablets (1.25 mg total) by mouth daily at 6 PM. 10/26/14  Yes Rushil Sherrye Payor, MD  cetirizine (ZYRTEC) 10 MG tablet Take 10 mg by mouth daily as needed for allergies.     Historical Provider, MD  ondansetron (ZOFRAN) 4 MG tablet Take 4 mg by mouth every 4 (four) hours as needed for nausea or vomiting.    Historical Provider, MD  polyethylene glycol powder (GLYCOLAX/MIRALAX) powder Take 1 Container by mouth daily as needed for mild constipation.  11/14/14   Historical Provider, MD    Allergies:  No Known Allergies  Social History:  reports that she has quit smoking. Her smoking use included Cigarettes. She quit after 12 years of use. She has never used smokeless tobacco. She reports that she does not drink alcohol or use illicit drugs.  Family History: Family History  Problem Relation Age of Onset  . Hypertension Mother   . Diabetes Mother   . Coronary artery disease Father   . Diabetes Brother   . Hypertension Brother   . Lung cancer Brother   . Arthritis Other   . Diabetes Other   . Heart disease Other     female < 55    Physical Exam: Filed Vitals:   12/06/14 2257 12/06/14 2300 12/06/14 2315  12/06/14 2324  BP:  151/64 167/71 167/71  Pulse:  71 70 71  Temp: 101 F (38.3 C)   103 F (39.4 C)  TempSrc: Oral   Rectal  Resp:  24 24 26   Height:      Weight:      SpO2:  98% 97% 100%   General appearance: alert, cooperative and no distress Head: Normocephalic, without obvious abnormality, atraumatic Eyes: negative Nose: Nares normal. Septum midline. Mucosa normal. No drainage or sinus tenderness. Neck: no JVD and supple, symmetrical, trachea midline Lungs: clear to auscultation bilaterally Heart: regular rate and rhythm, S1, S2 normal, no murmur, click, rub or gallop Abdomen: soft, non-tender; bowel sounds normal; no masses,  no organomegaly Extremities: extremities normal, atraumatic, no cyanosis or edema Pulses: 2+ and symmetric Skin: Skin color, texture, turgor normal. No rashes or lesions Neurologic: Grossly normal  Labs on Admission:   Recent Labs  12/06/14 2130  NA 135  K 4.3  CL 99*  CO2 25  GLUCOSE 193*  BUN 52*  CREATININE 2.11*  CALCIUM 8.9    Recent Labs  12/06/14 2130  WBC 9.6  NEUTROABS 8.3*  HGB 10.1*  HCT 30.7*  MCV 95.0  PLT 179    Radiological Exams on Admission: Dg Chest 2 View  12/06/2014   CLINICAL DATA:  Acute onset of shortness of breath. Initial encounter.  EXAM: CHEST  2 VIEW  COMPARISON:  Chest radiograph performed 11/30/2014  FINDINGS: The lungs are well-aerated. Trace fluid is suggested along the right minor fissure. Minimal bibasilar atelectasis is noted. There is no evidence of pleural effusion or pneumothorax.  The heart is borderline enlarged. A pacemaker/AICD is noted at the right chest wall, with leads ending at the right atrium, right ventricle and coronary sinus. No acute osseous abnormalities are seen.  IMPRESSION: Trace fluid again suggested along the right minor fissure. Borderline cardiomegaly. Minimal bibasilar atelectasis noted.   Electronically Signed   By: Garald Balding M.D.   On: 12/06/2014 23:03   US  Renal  11/30/2014   CLINICAL DATA:  Chronic kidney disease stage 4  EXAM: RENAL / URINARY TRACT ULTRASOUND COMPLETE  COMPARISON:  01/17/2014  FINDINGS: Right Kidney:  Length: 9.6 cm. Extrarenal pelvis on the right similar to prior study. 12 mm cyst midpole. Increased renal echogenicity.  Left Kidney:  Length: 11.1 cm. 12 mm upper pole cyst. Increased echogenicity of the renal cortex.  Bladder:  Appears normal for degree of bladder distention.  IMPRESSION: Medical renal disease.   Electronically Signed   By: Skipper Cliche M.D.   On: 11/30/2014 17:22   Old record reviewed Case discussed with edp dr Wilson Singer cxr reviewed no edema no obvious infiltrate ekg reviewed, paced  Assessment/Plan  69 yo female with fever of unclear source  Principal Problem:   Fever-  Pt has more respiratory symptoms although very mild, will cover with levaquin for broader coverage of both urine source and pulm.  Blood and urine cultures ordered.  nonfocal exam.  No rash evident.  Vss.  Lactic acid level normal.  Active Problems:   History of pulmonary embolism-  On coumadin, check inr   CKD (chronic kidney disease), stage IV- stable at baseline   Chronic combined systolic and diastolic CHF (congestive heart failure)- compensated at this time, no further ivf.   Chronic anticoagulation-  Check inr   Atrial fibrillation- noted   Implantable cardioverter-defibrillator-CRT- Mdt- noted   COPD (chronic obstructive pulmonary disease)- stable   Anemia of chronic disease- stable  obs on medical floor.  Full code.  Sakai Wolford A 12/07/2014, 12:06 AM

## 2014-12-07 NOTE — Progress Notes (Signed)
CRITICAL VALUE ALERT  Critical value received:  Blood cultures x2 positive for gram positive cocci in pairs and chains.  Date of notification:  12/07/2014  Time of notification:  0630  Critical value read back: Yes  Nurse who received alert:  T.James, RN  MD notified (1st page):  Dr. Jerilee Hoh  Time of first page:  [phone]

## 2014-12-07 NOTE — Progress Notes (Signed)
Inpatient Diabetes Program Recommendations  AACE/ADA: New Consensus Statement on Inpatient Glycemic Control (2013)  Target Ranges:  Prepandial:   less than 140 mg/dL      Peak postprandial:   less than 180 mg/dL (1-2 hours)      Critically ill patients:  140 - 180 mg/dL   Results for AIZLEY, STENSETH (MRN [id number]) as of 12/07/2014 10:09  Ref. Range 12/07/2014 07:48  Glucose-Capillary Latest Ref Range: 65-99 mg/dL 218 (H)  Results for AARIN, SPARKMAN (MRN [id number]) as of 12/07/2014 10:09  Ref. Range 12/06/2014 21:30 12/07/2014 06:28  Glucose Latest Ref Range: 65-99 mg/dL 193 (H) 279 (H)   Diabetes history: DM2 Outpatient Diabetes medications: Amaryl 2 mg QAM Current orders for Inpatient glycemic control: Amaryl 2 mg QAM  Inpatient Diabetes Program Recommendations Correction (SSI): While inpatient, please consider ordering CBGs and Novolog correction scale ACHS.  Thanks, Barnie Alderman, RN, MSN, CCRN, CDE Diabetes Coordinator Inpatient Diabetes Program [phone] (Team Pager from [place] to [place]) [phone] ([facility] office) [phone] [facility] office) [phone] [facility] office)

## 2014-12-08 ENCOUNTER — Encounter (HOSPITAL_COMMUNITY): Payer: Self-pay | Admitting: *Deleted

## 2014-12-08 ENCOUNTER — Encounter (HOSPITAL_COMMUNITY)
Admission: EM | Disposition: A | Discharge status: Home Health | Payer: Self-pay | Source: Home / Self Care | Attending: Internal Medicine

## 2014-12-08 ENCOUNTER — Inpatient Hospital Stay (HOSPITAL_COMMUNITY): Payer: Commercial Managed Care - HMO

## 2014-12-08 DIAGNOSIS — I340 Nonrheumatic mitral (valve) insufficiency: Secondary | ICD-10-CM

## 2014-12-08 HISTORY — PX: TEE WITHOUT CARDIOVERSION: SHX5443

## 2014-12-08 LAB — GLUCOSE, CAPILLARY
GLUCOSE-CAPILLARY: 115 mg/dL — AB (ref 65–99)
GLUCOSE-CAPILLARY: 110 mg/dL — AB (ref 65–99)
GLUCOSE-CAPILLARY: 91 mg/dL (ref 65–99)
Glucose-Capillary: 138 mg/dL — ABNORMAL HIGH (ref 65–99)
Glucose-Capillary: 330 mg/dL — ABNORMAL HIGH (ref 65–99)
Glucose-Capillary: 53 mg/dL — ABNORMAL LOW (ref 65–99)
Glucose-Capillary: 107 mg/dL — ABNORMAL HIGH (ref 65–99)

## 2014-12-08 LAB — CBC
HCT: 27.4 % — ABNORMAL LOW (ref 36.0–46.0)
Hemoglobin: 9 g/dL — ABNORMAL LOW (ref 12.0–15.0)
MCH: 31.5 pg (ref 26.0–34.0)
MCHC: 32.8 g/dL (ref 30.0–36.0)
MCV: 95.8 fL (ref 78.0–100.0)
Platelets: 136 10*3/uL — ABNORMAL LOW (ref 150–400)
RBC: 2.86 MIL/uL — AB (ref 3.87–5.11)
RDW: 16.2 % — ABNORMAL HIGH (ref 11.5–15.5)
WBC: 6.4 10*3/uL (ref 4.0–10.5)

## 2014-12-08 LAB — BASIC METABOLIC PANEL
ANION GAP: 8 (ref 5–15)
BUN: 43 mg/dL — AB (ref 6–20)
CALCIUM: 8.8 mg/dL — AB (ref 8.9–10.3)
CO2: 27 mmol/L (ref 22–32)
CREATININE: 1.78 mg/dL — AB (ref 0.44–1.00)
Chloride: 104 mmol/L (ref 101–111)
GFR calc Af Amer: 32 mL/min — ABNORMAL LOW (ref >60)
GFR calc non Af Amer: 28 mL/min — ABNORMAL LOW (ref >60)
GLUCOSE: 116 mg/dL — AB (ref 65–99)
Potassium: 4.2 mmol/L (ref 3.5–5.1)
Sodium: 139 mmol/L (ref 135–145)

## 2014-12-08 LAB — PROTIME-INR
INR: 1.4 (ref 0.00–1.49)
PROTHROMBIN TIME: 17.3 s — AB (ref 11.6–15.2)

## 2014-12-08 SURGERY — ECHOCARDIOGRAM, TRANSESOPHAGEAL
Anesthesia: Moderate Sedation

## 2014-12-08 MED ORDER — BUTAMBEN-TETRACAINE-BENZOCAINE 2-2-14 % EX AERO
INHALATION_SPRAY | CUTANEOUS | Status: DC | PRN
  Administered 2014-12-08: 2 via TOPICAL

## 2014-12-08 MED ORDER — SODIUM CHLORIDE 0.9 % IV SOLN
2.0000 g | Freq: Three times a day (TID) | INTRAVENOUS | Status: DC
  Administered 2014-12-08 – 2014-12-09 (×2): 2 g via INTRAVENOUS
  Filled 2014-12-08 (×14): qty 2000

## 2014-12-08 MED ORDER — LIDOCAINE VISCOUS 2 % MT SOLN
OROMUCOSAL | Status: DC | PRN
  Administered 2014-12-08: 4 mL via OROMUCOSAL

## 2014-12-08 MED ORDER — FENTANYL CITRATE (PF) 100 MCG/2ML IJ SOLN
INTRAMUSCULAR | Status: AC
  Filled 2014-12-08: qty 4

## 2014-12-08 MED ORDER — FENTANYL CITRATE (PF) 100 MCG/2ML IJ SOLN
INTRAMUSCULAR | Status: DC | PRN
  Administered 2014-12-08: 25 ug via INTRAVENOUS

## 2014-12-08 MED ORDER — LIDOCAINE VISCOUS 2 % MT SOLN
OROMUCOSAL | Status: AC
  Filled 2014-12-08: qty 15

## 2014-12-08 MED ORDER — MIDAZOLAM HCL 5 MG/5ML IJ SOLN
INTRAMUSCULAR | Status: DC | PRN
  Administered 2014-12-08: 2 mg via INTRAVENOUS

## 2014-12-08 MED ORDER — WARFARIN SODIUM 2.5 MG PO TABS: 2.5000 mg | ORAL_TABLET | Freq: Once | ORAL | Status: DC

## 2014-12-08 MED ORDER — DEXTROSE 50 % IV SOLN
25.0000 mL | Freq: Once | INTRAVENOUS | Status: AC
  Administered 2014-12-08: 25 mL via INTRAVENOUS

## 2014-12-08 MED ORDER — MIDAZOLAM HCL 5 MG/5ML IJ SOLN
INTRAMUSCULAR | Status: AC
  Filled 2014-12-08: qty 10

## 2014-12-08 MED ORDER — DEXTROSE 50 % IV SOLN
INTRAVENOUS | Status: AC
  Administered 2014-12-08: 25 mL via INTRAVENOUS
  Filled 2014-12-08: qty 50

## 2014-12-08 MED ORDER — SODIUM CHLORIDE 0.9 % IV SOLN
INTRAVENOUS | Status: DC
  Administered 2014-12-08: 13:00:00 via INTRAVENOUS

## 2014-12-08 NOTE — Progress Notes (Signed)
I have reviewed TEE findings with Cristopher Peru (EP) who follows her and has placed devices in past   She will need to have extraction of the device.  Would recomm transfer to [facility] on Monday   Procedure will be done on Wednesday. That is when he is in EP lab nex. Will work to arrange back up with CV surgery. Continue antibiotics for now.    [name], MD

## 2014-12-08 NOTE — Progress Notes (Signed)
ANTIBIOTIC CONSULT NOTE - follow up  Pharmacy Consult for Vancomycin and Levaquin Indication: bacteremia  No Known Allergies  Patient Measurements: Height: 5\' 4"  (162.6 cm) Weight: 149 lb 3.2 oz (67.677 kg) IBW/kg (Calculated) : 54.7  Vital Signs: Temp: 98.5 F (36.9 C) (06/17 0559) Temp Source: Oral (06/17 0559) BP: 127/66 mmHg (06/17 0559) Pulse Rate: 70 (06/17 0559) Intake/Output from previous day: 06/16 0701 - 06/17 0700 In: 726 [P.O.:720; I.V.:6] Out: -  Intake/Output from this shift: Total I/O In: 240 [P.O.:240] Out: 2 [Stool:2]  Labs:  Recent Labs  12/06/14 2130 12/07/14 0628 12/08/14 0538  WBC 9.6 11.1* 6.4  HGB 10.1* 8.4* 9.0*  PLT 179 143* 136*  CREATININE 2.11* 2.18* 1.78*   Estimated Creatinine Clearance: 27.4 mL/min (by C-G formula based on Cr of 1.78). No results for input(s): VANCOTROUGH, VANCOPEAK, VANCORANDOM, GENTTROUGH, GENTPEAK, GENTRANDOM, TOBRATROUGH, TOBRAPEAK, TOBRARND, AMIKACINPEAK, AMIKACINTROU, AMIKACIN in the last 72 hours.   Microbiology: Recent Results (from the past 720 hour(s))  Rapid strep screen   (If patient has fever and/or without cough or runny nose)     Status: None   Collection Time: 11/30/14  8:05 AM  Result Value Ref Range Status   Streptococcus, Group A Screen (Direct) NEGATIVE NEGATIVE Final    Comment: (NOTE) A Rapid Antigen test may result negative if the antigen level in the sample is below the detection level of this test. The FDA has not cleared this test as a stand-alone test therefore the rapid antigen negative result has reflexed to a Group A Strep culture.   Culture, Group A Strep     Status: None   Collection Time: 11/30/14  8:05 AM  Result Value Ref Range Status   Strep A Culture Negative  Final    Comment: (NOTE) Performed At: [facility] [place], [place] [id number] Lindon Romp MD [id number]   MRSA PCR Screening     Status: Abnormal   Collection Time: 11/30/14  1:19  PM  Result Value Ref Range Status   MRSA by PCR POSITIVE (A) NEGATIVE Final    Comment:        The GeneXpert MRSA Assay (FDA approved for NASAL specimens only), is one component of a comprehensive MRSA colonization surveillance program. It is not intended to diagnose MRSA infection nor to guide or monitor treatment for MRSA infections. RESULT CALLED TO, READ BACK BY AND VERIFIED WITH: BOWIE,C RN @ 5701 11/30/14 LEONARDA,   Respiratory virus panel     Status: Abnormal   Collection Time: 11/30/14  1:31 PM  Result Value Ref Range Status   Respiratory Syncytial Virus A Negative Negative Final   Respiratory Syncytial Virus B Negative Negative Final   Influenza A Negative Negative Final   Influenza B Negative Negative Final   Parainfluenza 1 Negative Negative Final   Parainfluenza 2 Negative Negative Final   Parainfluenza 3 Negative Negative Final   Metapneumovirus Negative Negative Final   Rhinovirus Positive (A) Negative Final   Adenovirus Negative Negative Final    Comment: (NOTE) Performed At: [facility] [place], [place] [id number] Lindon Romp MD [id number]   Urine culture     Status: None   Collection Time: 11/30/14  1:33 PM  Result Value Ref Range Status   Specimen Description URINE, RANDOM  Final   Special Requests NONE  Final   Colony Count   Final    9,000 COLONIES/ML Performed at [employer] [facility]  Final    INSIGNIFICANT GROWTH Performed at [facility]    Report Status 12/01/2014 FINAL  Final  Culture, blood (routine x 2)     Status: None (Preliminary result)   Collection Time: 11/30/14  7:12 PM  Result Value Ref Range Status   Specimen Description BLOOD RIGHT ARM  Final   Special Requests   Final    BOTTLES DRAWN AEROBIC AND ANAEROBIC 10CC BLUE 5CC RED   Culture   Final    ENTEROCOCCUS SPECIES Note: Gram Stain Report Called to,Read Back By and Verified With: CECILIA POWELL 12/04/14 1425 BY  SMITHERSJ Performed at [facility]    Report Status PENDING  Incomplete  Culture, blood (routine x 2)     Status: None (Preliminary result)   Collection Time: 11/30/14  7:15 PM  Result Value Ref Range Status   Specimen Description BLOOD LEFT WRIST  Final   Special Requests BOTTLES DRAWN AEROBIC AND ANAEROBIC 10CC EACH  Final   Culture   Final    ENTEROCOCCUS SPECIES Performed at [facility]    Report Status PENDING  Incomplete  Blood culture (routine x 2)     Status: None (Preliminary result)   Collection Time: 12/06/14 10:00 PM  Result Value Ref Range Status   Specimen Description BLOOD RIGHT ARM  Final   Special Requests BOTTLES DRAWN AEROBIC AND ANAEROBIC 10CC EACH  Final   Culture  Setup Time   Final    GRAM POSITIVE COCCI IN PAIRS AND CHAINS Gram Stain Report Called to,Read Back By and Verified With: JAMES,T. AT 9417 ON 12/07/2014 BY BAUGHAM,M Performed at [facility]    Culture   Final    [place] Performed at [facility]    Report Status PENDING  Incomplete  Blood culture (routine x 2)     Status: None (Preliminary result)   Collection Time: 12/06/14 10:06 PM  Result Value Ref Range Status   Specimen Description BLOOD RIGHT HAND  Final   Special Requests BOTTLES DRAWN AEROBIC AND ANAEROBIC 10CC EACH  Final   Culture   Final    GRAM POSITIVE COCCI IN PAIRS AND CHAINS Gram Stain Report Called to,Read Back By and Verified With: JAMES,T. AT 4081 ON 12/07/2014 BY BAUGHAM,M. Performed at [facility]    Report Status PENDING  Incomplete  MRSA PCR Screening     Status: None   Collection Time: 12/07/14 12:25 AM  Result Value Ref Range Status   MRSA by PCR NEGATIVE NEGATIVE Final    Comment:        The GeneXpert MRSA Assay (FDA approved for NASAL specimens only), is one component of a comprehensive MRSA colonization surveillance program. It is not intended to diagnose MRSA infection nor to guide or monitor treatment  for MRSA infections.    Medical History: Past Medical History  Diagnosis Date  . Cardiomyopathy, nonischemic     a. 1999 nl cath;  b. 12/05 [employer]ant [place];  c. 10/2005 ICD extraction 2/2 enterococcus bacteremia and Veg on RV lead;  c. 05/2008 low risk Myoview (scarring w/ some evidence of inf ischemia);  d. 11/2012 Echo: EF 15-20%;  e. 01/2013 s/p MDT [place] CRT D, ser # [id number];  f. 05/2013 Echo: EF 15%.  . Enterococcal infection     a. 10/2005 - AICD-explanted  . Pulmonary embolism     a. 07/2005 after total right hip arthroplasty  . Hilar density     a. infrahilar mass/adenopathy on  CT scan 5/07; subsequently  resolved  . LBBB (left bundle branch block)   . GERD (gastroesophageal reflux disease)   . Hyperlipidemia   . Hypertension   . Tobacco abuse     a. discontinued in 1997, and then resumed  . Urinary incontinence   . Anemia     a. mild/chronic  . Villous adenoma of colon     a. tubovillous adenomatous polyp with focal high grade dysplasia; presented with hematochezia - followed by Dr. Laural Golden.  . Implantable cardioverter-defibrillator-CRT- Mdt     a.  01/2013 s/p MDT [place] CRT D, ser # [id number]  . Chronic systolic CHF (congestive heart failure)     a. 11/2012 Echo: EF 15-20%;  b. 05/2013 TEE EF 15%.  . Pneumonia 07/2005  . Obstructive sleep apnea     a. mild-did not tolerate CPAP (01/24/2013)  . History of blood transfusion   . Degenerative joint disease     of knees, shoulder, and hips  . PAF (paroxysmal atrial fibrillation)     a. 07/2012 s/p TEE/DCCV;  b. chronic coumadin;  c. 05/2013 Recurrent Afib->TEE/DCCV and amio initiation.  . CKD (chronic kidney disease), stage III     creatinin-1.44 in 1/09; 1.51 in 1/10  . Type II diabetes mellitus     type 2   Anti-infectives    Start     Dose/Rate Route Frequency Ordered Stop   12/09/14 0600  levofloxacin (LEVAQUIN) IVPB 750 mg     750 mg 100 mL/hr over 90 Minutes Intravenous Every 48 hours 12/07/14 1124     12/08/14  1000  vancomycin (VANCOCIN) IVPB 750 mg/150 ml premix     750 mg 150 mL/hr over 60 Minutes Intravenous Every 24 hours 12/07/14 1115     12/07/14 1030  vancomycin (VANCOCIN) 1,500 mg in sodium chloride 0.9 % 500 mL IVPB     1,500 mg 250 mL/hr over 120 Minutes Intravenous  Once 12/07/14 0920 12/07/14 1359   12/07/14 0100  levofloxacin (LEVAQUIN) IVPB 750 mg     750 mg 100 mL/hr over 90 Minutes Intravenous  Once 12/07/14 0053 12/07/14 0301   12/06/14 2300  cefTRIAXone (ROCEPHIN) 1 g in dextrose 5 % 50 mL IVPB     1 g 100 mL/hr over 30 Minutes Intravenous  Once 12/06/14 2254 12/06/14 2338     Assessment: 69yo female with enterococcal bacteremia, 2/2 blood cx from 6/9.  ID has added Vancomycin pending sensitivities (see ID note).  Pt has received a dose of Levaquin and Rocephin on admission.  SCr elevated on admission but has improved some today.  Normalized clcr ~30-39ml/min  Goal of Therapy:  Vancomycin trough level 15-20 mcg/ml Eradicate infection.  Plan:  Continue Levaquin 750mg  IV q48hrs Continue Vancomycin 750mg  IV q24hrs Monitor labs, renal fxn, cultures and sensitivities Deescalate ABX when appropriate F/U recommendations per ID  Hart Robinsons A 12/08/2014,10:44 AM

## 2014-12-08 NOTE — Progress Notes (Signed)
[facility] PROGRESS NOTE  [name] WYO:[id number] DOB: Aug 06, 1945 DOA: 12/06/2014 PCP: Robert Bellow, MD  Assessment/Plan: Enterococcal bacteremia: per blood culture drawn 11/30/14 during last hospitalization. Blood cultures drawn 12/06/14 with same. Max temp 99.1. Hx of ICD vegetation with Enterococcus. Vancomycin day #2. Await sensitivities. Will de-narrow antibiotics when able.  TEE scheduled for today. Will likely need ICD removed.    Chronic systolic and diastolic HF. Echo 09/2014 with EF 15% and grade 2 diastolic dysfunction. remains compensated. On amiodarone, carvedilol, imdur. Continue. Holding lasix for now. BP slightly labile.   COPD: remains stable at baseline:   Afib: On dig. Chadscore 7. On coumadin. INR 1.78. Pharmacy to dose  CKD: stage IV. Current creatinine at baseline. Urine output goode.   HTN: fair control   Diabetes: on oral agents. CBG range 138-330. Likely related to levaquin. Use SSI   Code Status: full Family Communication: husband at bedside Disposition Plan: home when ready   Consultants:  none  Procedures:  TEE   Antibiotics:  Vancomycin 12/07/14>>  levaquin 12/07/14>>  HPI/Subjective: Up to bedside commode. Reports feeling "ok"  Objective: Filed Vitals:   12/08/14 0559  BP: 127/66  Pulse: 70  Temp: 98.5 F (36.9 C)  Resp: 20    Intake/Output Summary (Last 24 hours) at 12/08/14 1156 Last data filed at 12/08/14 0900  Gross per 24 hour  Intake    723 ml  Output      2 ml  Net    721 ml   Filed Weights   12/07/14 0000 12/07/14 0500 12/08/14 0559  Weight: 67.2 kg (148 lb 2.4 oz) 67.178 kg (148 lb 1.6 oz) 67.677 kg (149 lb 3.2 oz)    Exam:   General:  Thin frail appears comfortable  Cardiovascular: RRR no MGR no LE edema  Respiratory: normal effort BS clear bilaterally  Abdomen: flat soft +BS non-tender to palpation no mass/organmegaly  Musculoskeletal: joints without swelling/erythema   Data  Reviewed: Basic Metabolic Panel:  Recent Labs Lab 12/02/14 0610 12/06/14 2130 12/07/14 0628 12/08/14 0538  NA 139 135 138 139  K 3.6 4.3 3.8 4.2  CL 103 99* 101 104  CO2 25 25 26 27   GLUCOSE 169* 193* 279* 116*  BUN 32* 52* 53* 43*  CREATININE 1.80* 2.11* 2.18* 1.78*  CALCIUM 9.3 8.9 8.5* 8.8*   Liver Function Tests: No results for input(s): AST, ALT, ALKPHOS, BILITOT, PROT, ALBUMIN in the last 168 hours. No results for input(s): LIPASE, AMYLASE in the last 168 hours. No results for input(s): AMMONIA in the last 168 hours. CBC:  Recent Labs Lab 12/02/14 0610 12/06/14 2130 12/07/14 0628 12/08/14 0538  WBC 4.5 9.6 11.1* 6.4  NEUTROABS  --  8.3*  --   --   HGB 10.1* 10.1* 8.4* 9.0*  HCT 30.2* 30.7* 25.4* 27.4*  MCV 91.8 95.0 96.2 95.8  PLT 137* 179 143* 136*   Cardiac Enzymes: No results for input(s): CKTOTAL, CKMB, CKMBINDEX, TROPONINI in the last 168 hours. BNP (last 3 results)  Recent Labs  10/17/14 1909 11/30/14 0825  BNP 488.1* 1322.3*    ProBNP (last 3 results)  Recent Labs  04/11/14 1220 04/27/14 1045 05/30/14 1016  PROBNP 8376.0* 14381.0* 6690.0*    CBG:  Recent Labs Lab 12/07/14 1203 12/07/14 1655 12/07/14 2037 12/08/14 0756 12/08/14 1119  GLUCAP 116* 75 157* 138* 330*    Recent Results (from the past 240 hour(s))  Rapid strep screen   (If patient has fever and/or without cough or  runny nose)     Status: None   Collection Time: 11/30/14  8:05 AM  Result Value Ref Range Status   Streptococcus, Group A Screen (Direct) NEGATIVE NEGATIVE Final    Comment: (NOTE) A Rapid Antigen test may result negative if the antigen level in the sample is below the detection level of this test. The FDA has not cleared this test as a stand-alone test therefore the rapid antigen negative result has reflexed to a Group A Strep culture.   Culture, Group A Strep     Status: None   Collection Time: 11/30/14  8:05 AM  Result Value Ref Range Status    Strep A Culture Negative  Final    Comment: (NOTE) Performed At: [facility] [place], [place] [id number] Lindon Romp MD [id number]:[id number]   MRSA PCR Screening     Status: Abnormal   Collection Time: 11/30/14  1:19 PM  Result Value Ref Range Status   MRSA by PCR POSITIVE (A) NEGATIVE Final    Comment:        The GeneXpert MRSA Assay (FDA approved for NASAL specimens only), is one component of a comprehensive MRSA colonization surveillance program. It is not intended to diagnose MRSA infection nor to guide or monitor treatment for MRSA infections. RESULT CALLED TO, READ BACK BY AND VERIFIED WITH: BOWIE,C RN @ 2025 11/30/14 LEONARDA,   Respiratory virus panel     Status: Abnormal   Collection Time: 11/30/14  1:31 PM  Result Value Ref Range Status   Respiratory Syncytial Virus A Negative Negative Final   Respiratory Syncytial Virus B Negative Negative Final   Influenza A Negative Negative Final   Influenza B Negative Negative Final   Parainfluenza 1 Negative Negative Final   Parainfluenza 2 Negative Negative Final   Parainfluenza 3 Negative Negative Final   Metapneumovirus Negative Negative Final   Rhinovirus Positive (A) Negative Final   Adenovirus Negative Negative Final    Comment: (NOTE) Performed At: [facility] [address] [place], [place] [id number] Lindon Romp MD [id number]   Urine culture     Status: None   Collection Time: 11/30/14  1:33 PM  Result Value Ref Range Status   Specimen Description URINE, RANDOM  Final   Special Requests NONE  Final   Colony Count   Final    9,000 COLONIES/ML Performed at [facility]    Culture   Final    INSIGNIFICANT GROWTH Performed at [facility]    Report Status 12/01/2014 FINAL  Final  Culture, blood (routine x 2)     Status: None (Preliminary result)   Collection Time: 11/30/14  7:12 PM  Result Value Ref Range Status   Specimen Description BLOOD RIGHT ARM   Final   Special Requests   Final    BOTTLES DRAWN AEROBIC AND ANAEROBIC 10CC BLUE 5CC RED   Culture   Final    ENTEROCOCCUS SPECIES Note: Gram Stain Report Called to,Read Back By and Verified With: CECILIA POWELL 12/04/14 1425 BY SMITHERSJ Performed at [facility]    Report Status PENDING  Incomplete  Culture, blood (routine x 2)     Status: None (Preliminary result)   Collection Time: 11/30/14  7:15 PM  Result Value Ref Range Status   Specimen Description BLOOD LEFT WRIST  Final   Special Requests BOTTLES DRAWN AEROBIC AND ANAEROBIC 10CC EACH  Final   Culture   Final    ENTEROCOCCUS SPECIES Performed at [facility]    Report Status PENDING  Incomplete  Blood culture (routine x 2)     Status: None (Preliminary result)   Collection Time: 12/06/14 10:00 PM  Result Value Ref Range Status   Specimen Description BLOOD RIGHT ARM  Final   Special Requests BOTTLES DRAWN AEROBIC AND ANAEROBIC 10CC EACH  Final   Culture  Setup Time   Final    GRAM POSITIVE COCCI IN PAIRS AND CHAINS Gram Stain Report Called to,Read Back By and Verified With: JAMES,T. AT 9735 ON 12/07/2014 BY BAUGHAM,M Performed at [facility]    Culture   Final    ENTEROCOCCUS SPECIES Performed at [facility]    Report Status PENDING  Incomplete  Blood culture (routine x 2)     Status: None (Preliminary result)   Collection Time: 12/06/14 10:06 PM  Result Value Ref Range Status   Specimen Description BLOOD RIGHT HAND  Final   Special Requests BOTTLES DRAWN AEROBIC AND ANAEROBIC 10CC EACH  Final   Culture  Setup Time   Final    GRAM POSITIVE COCCI IN PAIRS AND CHAINS Gram Stain Report Called to,Read Back By and Verified With: JAMES,T. AT 3299 ON 12/07/2014 BY BAUGHAM,M. Performed at [facility]    Culture   Final    ENTEROCOCCUS SPECIES SUSCEPTIBILITIES PERFORMED ON PREVIOUS CULTURE WITHIN THE LAST 5 DAYS. Performed at [facility]    Report Status PENDING   Incomplete  Culture, Urine     Status: None (Preliminary result)   Collection Time: 12/06/14 10:16 PM  Result Value Ref Range Status   Specimen Description URINE, CATHETERIZED  Final   Special Requests NONE  Final   Culture   Final    NO GROWTH < 24 HOURS Performed at [facility]    Report Status PENDING  Incomplete  MRSA PCR Screening     Status: None   Collection Time: 12/07/14 12:25 AM  Result Value Ref Range Status   MRSA by PCR NEGATIVE NEGATIVE Final    Comment:        The GeneXpert MRSA Assay (FDA approved for NASAL specimens only), is one component of a comprehensive MRSA colonization surveillance program. It is not intended to diagnose MRSA infection nor to guide or monitor treatment for MRSA infections.      Studies: Dg Chest 2 View  12/06/2014   CLINICAL DATA:  Acute onset of shortness of breath. Initial encounter.  EXAM: CHEST  2 VIEW  COMPARISON:  Chest radiograph performed 11/30/2014  FINDINGS: The lungs are well-aerated. Trace fluid is suggested along the right minor fissure. Minimal bibasilar atelectasis is noted. There is no evidence of pleural effusion or pneumothorax.  The heart is borderline enlarged. A pacemaker/AICD is noted at the right chest wall, with leads ending at the right atrium, right ventricle and coronary sinus. No acute osseous abnormalities are seen.  IMPRESSION: Trace fluid again suggested along the right minor fissure. Borderline cardiomegaly. Minimal bibasilar atelectasis noted.   Electronically Signed   By: Garald Balding M.D.   On: 12/06/2014 23:03    Scheduled Meds: . amiodarone  200 mg Oral Daily  . antiseptic oral rinse  7 mL Mouth Rinse BID  . carvedilol  9.375 mg Oral BID WC  . colchicine  0.6 mg Oral BID  . digoxin  0.0625 mg Oral QODAY  . feeding supplement (ENSURE ENLIVE)  237 mL Oral BID BM  . glimepiride  2 mg Oral Daily  . insulin aspart  0-15 Units Subcutaneous TID WC  . insulin aspart  0-5 Units Subcutaneous QHS   . isosorbide mononitrate  30 mg Oral Daily  . [START ON 12/09/2014] levofloxacin (LEVAQUIN) IV  750 mg Intravenous Q48H  . levothyroxine  25 mcg Oral QAC breakfast  . metoCLOPramide  5 mg Oral QID  . pravastatin  80 mg Oral QHS  . sodium chloride  3 mL Intravenous Q12H  . vancomycin  750 mg Intravenous Q24H  . warfarin  2.5 mg Oral Once  . Warfarin - Pharmacist Dosing Inpatient   Does not apply Q24H   Continuous Infusions:   Principal Problem:   Bacteremia Active Problems:   HTN (hypertension)   History of pulmonary embolism   Type 2 diabetes, uncontrolled, with renal manifestation   CKD (chronic kidney disease), stage IV   Chronic combined systolic and diastolic CHF (congestive heart failure)   Chronic anticoagulation   Atrial fibrillation   Implantable cardioverter-defibrillator-CRT- Mdt   COPD (chronic obstructive pulmonary disease)   Anemia of chronic disease   Fever   SOB (shortness of breath)    Time spent: 30 minutes    [facility] Pager [phone]. If 7PM-7AM, please contact night-coverage at [email or web address], password [email or web address] 12/08/2014, 11:56 AM  LOS: 1 day

## 2014-12-08 NOTE — Progress Notes (Signed)
    [facility]    PATIENT WITH ENTEROCOCCUS AGAIN GROWING FROM BLOOD CULTURES 2/2 ON ADMISSION  SHE HAS KNOWN ICD WITH PRIOR EXPLANTATION OF ICD FOR ENTEROCOCCUS IN 2007  BY [facility] SHE ALREADY MEETS CRITERIA FOR ENDOCARDITIS CLINICALLY WITH PERSISTENTLY POSITIVE BLOOD CULTURES WITH TYPICAL ORGANISM  HER ICD IS BY DEFINITION INFECTED  AFTER HAVING HER TEE SHE WILL NEED TO BE MOVED TO [place] TO BE SEEN BY GREG TAYLOR FOR EXPLANATION  I SPOKE WITH MICRO LAB AND ORGANISM IS FASTIDIOUS AND NOT COMPLETELY CLEAR IT IS ENTEROCOCCUS THOUGH IT IS A GRAM +  THEY STILL DO NOT HAVE DEFINITIVE ID AND SENSI FROM CULTURE DONE 8 DAYS AGO!  WE WILL CONTINUE VANCOMYCIN AND ADD HIGH DOSE AMP FOR NOW  I AM DCING LEVAQUIN  SHE WOULD NEED ADDITION OF AG, VS DOUBLE BETA LACTAM THERAPY IF THAT IS A POSSIBIITY X 6 WEEKS POST ICD REMOVAL  REPEAT BLOOD CULTURES ORDERED  CASE D/W DR. Jerilee Hoh

## 2014-12-08 NOTE — H&P (View-Only) (Signed)
[facility] consult:  Enterococcal bacteremia, 2/2 blood cultures from 6/9.  Has received one dose of levaquin, ceftriaxone.    I will put in for vancomycin pending sensitivities.  History of ICD vegetation with Enterococcus.  She will need cardiology input with TEE.    Call ID for any questions.  thanks

## 2014-12-08 NOTE — Op Note (Signed)
Soft tissue seen attached to pacer lead with mobile portion on distal end consistent with vegetation.  TV normal  No definite masses  Mild TR AV mildly thickened.  Mild AI PV normal  Trace PI MV mildly thickened   Moderate to severe MR Severe LV dysfunction.    Full report to follow.

## 2014-12-08 NOTE — Progress Notes (Signed)
Pt admitted to rm 3E07 from [facility] via [facility] , oriented to room, call bell placed within reach, denied pain at this time, cardiac monitor placed. Will continue to monitor.

## 2014-12-08 NOTE — Progress Notes (Signed)
Late Entry for 12/08/2014   Patient arrived from her TEE procedure and I noticed that the patient was very diaphoretic.   CBG was obtained and the patient was 58.  D50  63ml per protocol was given due to the patient being NPO due to the previous test.  15 mins after she was rechecked and the CBG was 110.  The patient did eat her dinner at 1700. Kerrie Pleasure NP was notified of the situation and no new orders were given.   Report was given to [facility] when they arrived on the unit to transfer the patient.  The transporter verbalized understanding and took the patient via stretcher with the packet.  The patients husband has her belongings.  I attempted to give report to [place] after the patient arrived at the facility.  She stated that she had all the information that she needed.

## 2014-12-08 NOTE — Progress Notes (Signed)
ANTIBIOTIC CONSULT NOTE-Preliminary  Pharmacy Consult for Ampicillin Indication: bacteremia  No Known Allergies  Patient Measurements: Height: 5\' 4"  (162.6 cm) Weight: 149 lb 3.2 oz (67.677 kg) IBW/kg (Calculated) : 54.7  Vital Signs: Temp: 98.5 F (36.9 C) (06/17 0559) Temp Source: Oral (06/17 0559) BP: 127/66 mmHg (06/17 0559) Pulse Rate: 70 (06/17 0559)  Labs:  Recent Labs  12/06/14 2130 12/07/14 0628 12/08/14 0538  WBC 9.6 11.1* 6.4  HGB 10.1* 8.4* 9.0*  PLT 179 143* 136*  CREATININE 2.11* 2.18* 1.78*   Estimated Creatinine Clearance: 27.4 mL/min (by C-G formula based on Cr of 1.78).  No results for input(s): VANCOTROUGH, VANCOPEAK, VANCORANDOM, GENTTROUGH, GENTPEAK, GENTRANDOM, TOBRATROUGH, TOBRAPEAK, TOBRARND, AMIKACINPEAK, AMIKACINTROU, AMIKACIN in the last 72 hours.   Microbiology: Recent Results (from the past 720 hour(s))  Rapid strep screen   (If patient has fever and/or without cough or runny nose)     Status: None   Collection Time: 11/30/14  8:05 AM  Result Value Ref Range Status   Streptococcus, Group A Screen (Direct) NEGATIVE NEGATIVE Final    Comment: (NOTE) A Rapid Antigen test may result negative if the antigen level in the sample is below the detection level of this test. The FDA has not cleared this test as a stand-alone test therefore the rapid antigen negative result has reflexed to a Group A Strep culture.   Culture, Group A Strep     Status: None   Collection Time: 11/30/14  8:05 AM  Result Value Ref Range Status   Strep A Culture Negative  Final    Comment: (NOTE) Performed At: [facility] [place], [place] [id number] Lindon Romp MD [id number]   MRSA PCR Screening     Status: Abnormal   Collection Time: 11/30/14  1:19 PM  Result Value Ref Range Status   MRSA by PCR POSITIVE (A) NEGATIVE Final    Comment:        The GeneXpert MRSA Assay (FDA approved for NASAL specimens only), is one component of  a comprehensive MRSA colonization surveillance program. It is not intended to diagnose MRSA infection nor to guide or monitor treatment for MRSA infections. RESULT CALLED TO, READ BACK BY AND VERIFIED WITH: BOWIE,C RN @ 7564 11/30/14 LEONARDA,   Respiratory virus panel     Status: Abnormal   Collection Time: 11/30/14  1:31 PM  Result Value Ref Range Status   Respiratory Syncytial Virus A Negative Negative Final   Respiratory Syncytial Virus B Negative Negative Final   Influenza A Negative Negative Final   Influenza B Negative Negative Final   Parainfluenza 1 Negative Negative Final   Parainfluenza 2 Negative Negative Final   Parainfluenza 3 Negative Negative Final   Metapneumovirus Negative Negative Final   Rhinovirus Positive (A) Negative Final   Adenovirus Negative Negative Final    Comment: (NOTE) Performed At: [facility] [address] [place], [place] [id number] Lindon Romp MD [id number]   Urine culture     Status: None   Collection Time: 11/30/14  1:33 PM  Result Value Ref Range Status   Specimen Description URINE, RANDOM  Final   Special Requests NONE  Final   Colony Count   Final    9,000 COLONIES/ML Performed at [facility]    Culture   Final    INSIGNIFICANT GROWTH Performed at [facility]    Report Status 12/01/2014 FINAL  Final  Culture, blood (routine x 2)     Status: None (  Preliminary result)   Collection Time: 11/30/14  7:12 PM  Result Value Ref Range Status   Specimen Description BLOOD RIGHT ARM  Final   Special Requests   Final    BOTTLES DRAWN AEROBIC AND ANAEROBIC 10CC BLUE 5CC RED   Culture   Final    ENTEROCOCCUS SPECIES Note: Gram Stain Report Called to,Read Back By and Verified With: CECILIA POWELL 12/04/14 1425 BY SMITHERSJ Performed at [facility]    Report Status PENDING  Incomplete  Culture, blood (routine x 2)     Status: None (Preliminary result)   Collection Time: 11/30/14  7:15 PM  Result  Value Ref Range Status   Specimen Description BLOOD LEFT WRIST  Final   Special Requests BOTTLES DRAWN AEROBIC AND ANAEROBIC 10CC EACH  Final   Culture   Final    ENTEROCOCCUS SPECIES Performed at [facility]    Report Status PENDING  Incomplete  Blood culture (routine x 2)     Status: None (Preliminary result)   Collection Time: 12/06/14 10:00 PM  Result Value Ref Range Status   Specimen Description BLOOD RIGHT ARM  Final   Special Requests BOTTLES DRAWN AEROBIC AND ANAEROBIC 10CC EACH  Final   Culture  Setup Time   Final    GRAM POSITIVE COCCI IN PAIRS AND CHAINS Gram Stain Report Called to,Read Back By and Verified With: JAMES,T. AT 1610 ON 12/07/2014 BY BAUGHAM,M Performed at [facility]    Culture   Final    ENTEROCOCCUS SPECIES Performed at [facility]    Report Status PENDING  Incomplete  Blood culture (routine x 2)     Status: None (Preliminary result)   Collection Time: 12/06/14 10:06 PM  Result Value Ref Range Status   Specimen Description BLOOD RIGHT HAND  Final   Special Requests BOTTLES DRAWN AEROBIC AND ANAEROBIC 10CC EACH  Final   Culture  Setup Time   Final    GRAM POSITIVE COCCI IN PAIRS AND CHAINS Gram Stain Report Called to,Read Back By and Verified With: JAMES,T. AT 9604 ON 12/07/2014 BY BAUGHAM,M. Performed at [facility]    Culture   Final    ENTEROCOCCUS SPECIES SUSCEPTIBILITIES PERFORMED ON PREVIOUS CULTURE WITHIN THE LAST 5 DAYS. Performed at [facility]    Report Status PENDING  Incomplete  Culture, Urine     Status: None (Preliminary result)   Collection Time: 12/06/14 10:16 PM  Result Value Ref Range Status   Specimen Description URINE, CATHETERIZED  Final   Special Requests NONE  Final   Culture   Final    NO GROWTH < 24 HOURS Performed at [facility]    Report Status PENDING  Incomplete  MRSA PCR Screening     Status: None   Collection Time: 12/07/14 12:25 AM  Result Value Ref Range  Status   MRSA by PCR NEGATIVE NEGATIVE Final    Comment:        The GeneXpert MRSA Assay (FDA approved for NASAL specimens only), is one component of a comprehensive MRSA colonization surveillance program. It is not intended to diagnose MRSA infection nor to guide or monitor treatment for MRSA infections.    Anti-infectives    Start     Dose/Rate Route Frequency Ordered Stop   12/09/14 0600  levofloxacin (LEVAQUIN) IVPB 750 mg  Status:  Discontinued     750 mg 100 mL/hr over 90 Minutes Intravenous Every 48 hours 12/07/14 1124 12/08/14 1210   12/08/14 1400  ampicillin (OMNIPEN) 2 g in sodium chloride 0.9 % 50 mL IVPB     2 g 150 mL/hr over 20 Minutes Intravenous 3 times per day 12/08/14 1222     12/08/14 1000  vancomycin (VANCOCIN) IVPB 750 mg/150 ml premix     750 mg 150 mL/hr over 60 Minutes Intravenous Every 24 hours 12/07/14 1115     12/07/14 1030  vancomycin (VANCOCIN) 1,500 mg in sodium chloride 0.9 % 500 mL IVPB     1,500 mg 250 mL/hr over 120 Minutes Intravenous  Once 12/07/14 0920 12/07/14 1359   12/07/14 0100  levofloxacin (LEVAQUIN) IVPB 750 mg     750 mg 100 mL/hr over 90 Minutes Intravenous  Once 12/07/14 0053 12/07/14 0301   12/06/14 2300  cefTRIAXone (ROCEPHIN) 1 g in dextrose 5 % 50 mL IVPB     1 g 100 mL/hr over 30 Minutes Intravenous  Once 12/06/14 2254 12/06/14 2338     Assessment: 69yo female with enterococcal bacteremia, 2/2 blood cx from 6/9. ID has added Vancomycin and Ampicillin pending sensitivities (see ID note). Pt received a dose of Levaquin and Rocephin on admission. SCr elevated on admission but has improved some today.  NKDA.  Goal of Therapy:  Eradicate infection.  Plan:  Preliminary review of pertinent patient information completed.  Protocol will be initiated with Ampicillin 2gm IV q8hrs pending final c/s.    Ena Dawley, [place] 12/08/2014,12:46 PM

## 2014-12-08 NOTE — Interval H&P Note (Signed)
History and Physical Interval Note:  12/08/2014 3:42 PM  [name]  has presented today for surgery, with the diagnosis of sepsis  The various methods of treatment have been discussed with the patient and family. After consideration of risks, benefits and other options for treatment, the patient has consented to  Procedure(s): TRANSESOPHAGEAL ECHOCARDIOGRAM (TEE) (N/A) as a surgical intervention .  The patient's history has been reviewed, patient examined, no change in status, stable for surgery.  I have reviewed the patient's chart and labs.  Questions were answered to the patient's satisfaction.     [name]

## 2014-12-08 NOTE — Progress Notes (Signed)
ANTICOAGULATION CONSULT NOTE - follow up  Pharmacy Consult for Coumadin (chronic Rx PTA) Indication: H/O PE  No Known Allergies  Patient Measurements: Height: 5\' 4"  (162.6 cm) Weight: 149 lb 3.2 oz (67.677 kg) IBW/kg (Calculated) : 54.7  Vital Signs: Temp: 98.5 F (36.9 C) (06/17 0559) Temp Source: Oral (06/17 0559) BP: 127/66 mmHg (06/17 0559) Pulse Rate: 70 (06/17 0559)  Labs:  Recent Labs  12/06/14 2130 12/06/14 2131 12/07/14 0628 12/08/14 0538  HGB 10.1*  --  8.4* 9.0*  HCT 30.7*  --  25.4* 27.4*  PLT 179  --  143* 136*  LABPROT  --  15.9* 17.6* 17.3*  INR  --  1.26 1.44 1.40  CREATININE 2.11*  --  2.18* 1.78*   Estimated Creatinine Clearance: 27.4 mL/min (by C-G formula based on Cr of 1.78).  Medical History: Past Medical History  Diagnosis Date  . Cardiomyopathy, nonischemic     a. 1999 nl cath;  b. 12/05 [employer] [place];  c. 10/2005 ICD extraction 2/2 enterococcus bacteremia and Veg on RV lead;  c. 05/2008 low risk Myoview (scarring w/ some evidence of inf ischemia);  d. 11/2012 Echo: EF 15-20%;  e. 01/2013 s/p MDT [place] CRT D, ser # [id number];  f. 05/2013 Echo: EF 15%.  . Enterococcal infection     a. 10/2005 - AICD-explanted  . Pulmonary embolism     a. 07/2005 after total right hip arthroplasty  . Hilar density     a. infrahilar mass/adenopathy on CT scan 5/07; subsequently  resolved  . LBBB (left bundle branch block)   . GERD (gastroesophageal reflux disease)   . Hyperlipidemia   . Hypertension   . Tobacco abuse     a. discontinued in 1997, and then resumed  . Urinary incontinence   . Anemia     a. mild/chronic  . Villous adenoma of colon     a. tubovillous adenomatous polyp with focal high grade dysplasia; presented with hematochezia - followed by Dr. Laural Golden.  . Implantable cardioverter-defibrillator-CRT- Mdt     a.  01/2013 s/p MDT [place] [facility] CRT D, ser # [id number]  . Chronic systolic CHF (congestive heart failure)     a. 11/2012 Echo: EF 15-20%;  b.  05/2013 TEE EF 15%.  . Pneumonia 07/2005  . Obstructive sleep apnea     a. mild-did not tolerate CPAP (01/24/2013)  . History of blood transfusion   . Degenerative joint disease     of knees, shoulder, and hips  . PAF (paroxysmal atrial fibrillation)     a. 07/2012 s/p TEE/DCCV;  b. chronic coumadin;  c. 05/2013 Recurrent Afib->TEE/DCCV and amio initiation.  . CKD (chronic kidney disease), stage III     creatinin-1.44 in 1/09; 1.51 in 1/10  . Type II diabetes mellitus     type 2   Medications:  Prescriptions prior to admission  Medication Sig Dispense Refill Last Dose  . amiodarone (PACERONE) 200 MG tablet Take 1 tablet (200 mg total) by mouth daily. 90 tablet 3 12/06/2014 at Unknown time  . carvedilol (COREG) 3.125 MG tablet Take 3 tablets (9.375 mg total) by mouth 2 (two) times daily with a meal. 180 tablet 2 12/06/2014 at 800  . colchicine 0.6 MG tablet Take 0.6 mg by mouth 2 (two) times daily.    12/06/2014 at Unknown time  . digoxin (LANOXIN) 0.125 MG tablet Take 0.5 tablets (0.0625 mg total) by mouth every other day. 30 tablet 3 12/05/2014 at Unknown time  . feeding supplement,  ENSURE ENLIVE, (ENSURE ENLIVE) LIQD Take 237 mLs by mouth 2 (two) times daily between meals. 237 mL 12 12/05/2014 at Unknown time  . ferrous sulfate 324 (65 FE) MG TBEC Take 1 tablet (325 mg total) by mouth 2 (two) times daily. 60 tablet  12/06/2014 at Unknown time  . furosemide (LASIX) 40 MG tablet TAKE 1 AND 1/2 TABLETS BY MOUTH TWICE DAILY. 90 tablet 3 12/06/2014 at Unknown time  . glimepiride (AMARYL) 2 MG tablet Take 2 mg by mouth daily.   12/06/2014 at Unknown time  . isosorbide mononitrate (IMDUR) 30 MG 24 hr tablet Take 1 tablet (30 mg total) by mouth daily. 30 tablet 0 12/06/2014 at Unknown time  . levothyroxine (SYNTHROID, LEVOTHROID) 25 MCG tablet Take 1 tablet (25 mcg total) by mouth daily before breakfast. 30 tablet 0 12/06/2014 at Unknown time  . metoCLOPramide (REGLAN) 5 MG tablet Take 5 mg by mouth 4 (four)  times daily.   12/06/2014 at Unknown time  . oxyCODONE-acetaminophen (PERCOCET) 10-325 MG per tablet Take 1 tablet by mouth every 6 (six) hours as needed for pain.    12/05/2014 at Unknown time  . pravastatin (PRAVACHOL) 40 MG tablet Take 80 mg by mouth at bedtime.    12/05/2014 at Unknown time  . warfarin (COUMADIN) 2.5 MG tablet Take 0.5 tablets (1.25 mg total) by mouth daily at 6 PM. 10 tablet 0 12/06/2014 at 1800  . cetirizine (ZYRTEC) 10 MG tablet Take 10 mg by mouth daily as needed for allergies.    unknown  . ondansetron (ZOFRAN) 4 MG tablet Take 4 mg by mouth every 4 (four) hours as needed for nausea or vomiting.   unknown  . polyethylene glycol powder (GLYCOLAX/MIRALAX) powder Take 1 Container by mouth daily as needed for mild constipation.    unknown   Assessment: 69yo female on chronic Coumadin PTA for h/o PE.  INR is SUBtherapeutic on admission.  Home dose is listed above.  No bleeding reported.   Goal of Therapy:  INR 2-3 Monitor platelets by anticoagulation protocol: Yes   Plan:  Coumadin 2.5mg  today x 1 (dose increase to boost INR) INR daily.  [place] Crane, Evander Macaraeg A 12/08/2014,10:42 AM

## 2014-12-08 NOTE — Care Management Note (Signed)
Case Management Note  Patient Details  Name: [name] MRN: [id number] Date of Birth: 1945/11/17  Subjective/Objective:                  Pt admitted from home with bactremia and fever, Pt lives with her husband and will return home at discharge. Pt is independent with ADL's. Pt has a cane, walker, and BSC.  Action/Plan: Pt for TEE today. May need transfer [facility] after TEE. Will continue to follow for discharge planning needs. Expected Discharge Date:  12/11/14               Expected Discharge Plan:  Home/Self Care  In-House Referral:  NA  Discharge planning Services  CM Consult  Post Acute Care Choice:  NA Choice offered to:  NA  DME Arranged:    DME Agency:     HH Arranged:    HH Agency:     Status of Service:  Completed, signed off  Medicare Important Message Given:  Yes Date Medicare IM Given:    Medicare IM give by:    Date Additional Medicare IM Given:    Additional Medicare Important Message give by:     If discussed at [place] of Stay Meetings, dates discussed:    Additional Comments:  Joylene Draft, RN 12/08/2014, 9:19 AM

## 2014-12-09 ENCOUNTER — Inpatient Hospital Stay (HOSPITAL_COMMUNITY): Payer: Commercial Managed Care - HMO

## 2014-12-09 DIAGNOSIS — N184 Chronic kidney disease, stage 4 (severe): Secondary | ICD-10-CM

## 2014-12-09 DIAGNOSIS — I480 Paroxysmal atrial fibrillation: Secondary | ICD-10-CM

## 2014-12-09 DIAGNOSIS — T827XXA Infection and inflammatory reaction due to other cardiac and vascular devices, implants and grafts, initial encounter: Secondary | ICD-10-CM | POA: Insufficient documentation

## 2014-12-09 DIAGNOSIS — E118 Type 2 diabetes mellitus with unspecified complications: Secondary | ICD-10-CM | POA: Insufficient documentation

## 2014-12-09 DIAGNOSIS — Z95810 Presence of automatic (implantable) cardiac defibrillator: Secondary | ICD-10-CM

## 2014-12-09 DIAGNOSIS — I5042 Chronic combined systolic (congestive) and diastolic (congestive) heart failure: Secondary | ICD-10-CM

## 2014-12-09 DIAGNOSIS — B952 Enterococcus as the cause of diseases classified elsewhere: Secondary | ICD-10-CM

## 2014-12-09 DIAGNOSIS — R7881 Bacteremia: Secondary | ICD-10-CM

## 2014-12-09 DIAGNOSIS — T826XXA Infection and inflammatory reaction due to cardiac valve prosthesis, initial encounter: Secondary | ICD-10-CM

## 2014-12-09 DIAGNOSIS — E1122 Type 2 diabetes mellitus with diabetic chronic kidney disease: Secondary | ICD-10-CM

## 2014-12-09 LAB — CULTURE, BLOOD (ROUTINE X 2)

## 2014-12-09 LAB — GLUCOSE, CAPILLARY
GLUCOSE-CAPILLARY: 95 mg/dL (ref 65–99)
Glucose-Capillary: 221 mg/dL — ABNORMAL HIGH (ref 65–99)
Glucose-Capillary: 72 mg/dL (ref 65–99)
Glucose-Capillary: 80 mg/dL (ref 65–99)

## 2014-12-09 LAB — URINE CULTURE: Culture: NO GROWTH

## 2014-12-09 LAB — PROTIME-INR
INR: 1.4 (ref 0.00–1.49)
Prothrombin Time: 17.3 seconds — ABNORMAL HIGH (ref 11.6–15.2)

## 2014-12-09 MED ORDER — IOHEXOL 300 MG/ML  SOLN
25.0000 mL | INTRAMUSCULAR | Status: AC
  Administered 2014-12-09 (×2): 25 mL via ORAL

## 2014-12-09 MED ORDER — WARFARIN SODIUM 2.5 MG PO TABS
2.5000 mg | ORAL_TABLET | Freq: Once | ORAL | Status: AC
  Administered 2014-12-09: 2.5 mg via ORAL
  Filled 2014-12-09: qty 1

## 2014-12-09 MED ORDER — SODIUM CHLORIDE 0.9 % IV SOLN
3.0000 g | Freq: Once | INTRAVENOUS | Status: AC
  Administered 2014-12-09: 3 g via INTRAVENOUS
  Filled 2014-12-09: qty 3

## 2014-12-09 MED ORDER — SODIUM CHLORIDE 0.9 % IV SOLN
3.0000 g | Freq: Two times a day (BID) | INTRAVENOUS | Status: DC
  Administered 2014-12-10 – 2014-12-11 (×3): 3 g via INTRAVENOUS
  Filled 2014-12-09 (×4): qty 3

## 2014-12-09 MED ORDER — FUROSEMIDE 40 MG PO TABS
40.0000 mg | ORAL_TABLET | Freq: Every day | ORAL | Status: DC
  Administered 2014-12-09 – 2014-12-10 (×2): 40 mg via ORAL
  Filled 2014-12-09 (×3): qty 1

## 2014-12-09 NOTE — Progress Notes (Signed)
ANTICOAGULATION CONSULT NOTE - follow up  Pharmacy Consult for Coumadin (chronic Rx PTA) Indication: H/O PE  No Known Allergies  Patient Measurements: Height: 5\' 4"  (162.6 cm) Weight: 148 lb 14.4 oz (67.541 kg) (scale a) IBW/kg (Calculated) : 54.7  Vital Signs: Temp: 98.2 F (36.8 C) (06/18 0610) Temp Source: Oral (06/18 0610) BP: 138/66 mmHg (06/18 0610) Pulse Rate: 70 (06/18 0610)  Labs:  Recent Labs  12/06/14 2130 12/06/14 2131 12/07/14 0628 12/08/14 0538  HGB 10.1*  --  8.4* 9.0*  HCT 30.7*  --  25.4* 27.4*  PLT 179  --  143* 136*  LABPROT  --  15.9* 17.6* 17.3*  INR  --  1.26 1.44 1.40  CREATININE 2.11*  --  2.18* 1.78*   Estimated Creatinine Clearance: 27.4 mL/min (by C-G formula based on Cr of 1.78).  Medical History: Past Medical History  Diagnosis Date  . Cardiomyopathy, nonischemic     a. 1999 nl cath;  b. 12/05 [employer] [place];  c. 10/2005 ICD extraction 2/2 enterococcus bacteremia and Veg on RV lead;  c. 05/2008 low risk Myoview (scarring w/ some evidence of inf ischemia);  d. 11/2012 Echo: EF 15-20%;  e. 01/2013 s/p MDT [facility] CRT D, ser # [id number];  f. 05/2013 Echo: EF 15%.  . Enterococcal infection     a. 10/2005 - AICD-explanted  . Pulmonary embolism     a. 07/2005 after total right hip arthroplasty  . Hilar density     a. infrahilar mass/adenopathy on CT scan 5/07; subsequently  resolved  . LBBB (left bundle branch block)   . GERD (gastroesophageal reflux disease)   . Hyperlipidemia   . Hypertension   . Tobacco abuse     a. discontinued in 1997, and then resumed  . Urinary incontinence   . Anemia     a. mild/chronic  . Villous adenoma of colon     a. tubovillous adenomatous polyp with focal high grade dysplasia; presented with hematochezia - followed by Dr. Laural Golden.  . Implantable cardioverter-defibrillator-CRT- Mdt     a.  01/2013 s/p MDT [facility] CRT D, ser # [id number]  . Chronic systolic CHF (congestive heart failure)     a. 11/2012 Echo: EF  15-20%;  b. 05/2013 TEE EF 15%.  . Pneumonia 07/2005  . Obstructive sleep apnea     a. mild-did not tolerate CPAP (01/24/2013)  . History of blood transfusion   . Degenerative joint disease     of knees, shoulder, and hips  . PAF (paroxysmal atrial fibrillation)     a. 07/2012 s/p TEE/DCCV;  b. chronic coumadin;  c. 05/2013 Recurrent Afib->TEE/DCCV and amio initiation.  . CKD (chronic kidney disease), stage III     creatinin-1.44 in 1/09; 1.51 in 1/10  . Type II diabetes mellitus     type 2   Medications:  Prescriptions prior to admission  Medication Sig Dispense Refill Last Dose  . amiodarone (PACERONE) 200 MG tablet Take 1 tablet (200 mg total) by mouth daily. 90 tablet 3 12/08/2014 at 0900  . carvedilol (COREG) 3.125 MG tablet Take 3 tablets (9.375 mg total) by mouth 2 (two) times daily with a meal. 180 tablet 2 12/08/2014 at 0900  . cetirizine (ZYRTEC) 10 MG tablet Take 10 mg by mouth daily as needed for allergies.    Past Week at Unknown time  . colchicine 0.6 MG tablet Take 0.6 mg by mouth 2 (two) times daily.    12/08/2014 at 0900  . digoxin (LANOXIN)  0.125 MG tablet Take 0.5 tablets (0.0625 mg total) by mouth every other day. 30 tablet 3 12/07/2014 at 0900  . feeding supplement, ENSURE ENLIVE, (ENSURE ENLIVE) LIQD Take 237 mLs by mouth 2 (two) times daily between meals. 237 mL 12 12/08/2014 at Unknown time  . ferrous sulfate 324 (65 FE) MG TBEC Take 1 tablet (325 mg total) by mouth 2 (two) times daily. 60 tablet  Past Week at Unknown time  . furosemide (LASIX) 40 MG tablet TAKE 1 AND 1/2 TABLETS BY MOUTH TWICE DAILY. 90 tablet 3 Past Week at Unknown time  . glimepiride (AMARYL) 2 MG tablet Take 2 mg by mouth daily.   12/08/2014 at 0900  . isosorbide mononitrate (IMDUR) 30 MG 24 hr tablet Take 1 tablet (30 mg total) by mouth daily. 30 tablet 0 12/08/2014 at 0900  . levothyroxine (SYNTHROID, LEVOTHROID) 25 MCG tablet Take 1 tablet (25 mcg total) by mouth daily before breakfast. 30 tablet 0  12/08/2014 at 0900  . metoCLOPramide (REGLAN) 5 MG tablet Take 5 mg by mouth 4 (four) times daily.   12/08/2014 at 0900  . ondansetron (ZOFRAN) 4 MG tablet Take 4 mg by mouth every 4 (four) hours as needed for nausea or vomiting.   12/07/2014 at Unknown time  . oxyCODONE-acetaminophen (PERCOCET) 10-325 MG per tablet Take 1 tablet by mouth every 6 (six) hours as needed for pain.    12/05/2014 at Unknown time  . pravastatin (PRAVACHOL) 40 MG tablet Take 80 mg by mouth at bedtime.    12/07/2014 at Unknown time  . warfarin (COUMADIN) 2.5 MG tablet Take 0.5 tablets (1.25 mg total) by mouth daily at 6 PM. 10 tablet 0 12/07/2014 at 1415  . polyethylene glycol powder (GLYCOLAX/MIRALAX) powder Take 1 Container by mouth daily as needed for mild constipation.    unknown   Assessment: 69yo female on chronic Coumadin PTA for h/o PE.  INR was SUBtherapeutic on admission. Patient was scheduled to receive a dose of Coumadin yesterday but did not receive dose since it was automatically held during patient transfer [facility] to [facility]. INR today remains sub-therapeutic at 1.4 today. Patient takes Coumadin 1.25 mg daily at home    Goal of Therapy:  INR 2-3 Monitor platelets by anticoagulation protocol: Yes   Plan:  Coumadin 2.5mg  today x 1 (dose increase to boost INR) INR daily.  Albertina Parr, PharmD., BCPS Clinical Pharmacist Pager [phone]

## 2014-12-09 NOTE — Progress Notes (Signed)
[facility]                                                                              Patient Demographics  [name], is a 69 y.o. female, DOB - 02/23/1946, BVQ:[id number]  Admit date - 12/06/2014   Admitting Physician Phillips Grout, MD  Outpatient Primary MD for the patient is Robert Bellow, MD  LOS - 2   Chief Complaint  Patient presents with  . Shortness of Breath       Brief HPI  69 yo female h/o chf, ckd, aicd, PE, on coumadin comes in with over one day of fever, chills and today rigors. Pt has been having a mild cough but nothing too impressive. No swelling in legs. No rashes. No chest pain or abdominal pain. No dysuria or change in urinary flow. Some mild sob, no nasal congestion. No n/v/d. No sick contacts. No pain or swelling around her aicd site. Her temp is over 102 in the ED, source unclear. Asked to admit for fever. Pt given rocephin in ED for possible uti. No neck pain, vision changes.    Assessment & Plan    Principal Problem: Enterococcal bacteremia: ICD lead endocarditis - per blood culture drawn 11/30/14 during last hospitalization, Blood cultures drawn 12/06/14 with same.  -Patient was transferred [facility], TEE done which showed moderate to severe MR, severe LV dysfunction, soft tissue attached to the pacemaker lead with mobile portion on the distal and consistent with vegetation - Cardiology and ID following, ICD extraction next week - Continue IV vancomycin    Active Problems: Chronic systolic and diastolic HF. Echo 09/2014 with EF 15% and grade 2 diastolic dysfunction, remains compensated. - On amiodarone, carvedilol, imdur. - restart Lasix 40 mg daily, strict I's and O's and daily weights  COPD: remains stable at baseline   Atrial fibrillation:  -  rate controlled, continue amiodarone  - Chadscore 7. On coumadin Per pharmacy   CKD: stage IV -  baseline creatinine 1.8-2.0 - Restart Lasix  HTN:BP  currently stable   Diabetes mellitus - Fairly controlled, continue sliding scale insulin    Code Status:  full code  Family Communication: Discussed in detail with the patient, all imaging results, lab results explained to the patient    Disposition Plan: Hopefully next week  Time Spent in minutes 25 minutes  Procedure TEE  Consults  Cardiology ID  DVT Prophylaxis  warfarin  Medications  Scheduled Meds: . amiodarone  200 mg Oral Daily  . antiseptic oral rinse  7 mL Mouth Rinse BID  . carvedilol  9.375 mg Oral BID WC  . colchicine  0.6 mg Oral BID  . digoxin  0.0625 mg Oral QODAY  . feeding supplement (ENSURE ENLIVE)  237 mL Oral BID BM  . glimepiride  2 mg Oral Daily  . insulin aspart  0-15 Units Subcutaneous TID WC  . insulin aspart  0-5 Units Subcutaneous QHS  . isosorbide mononitrate  30 mg Oral Daily  . levothyroxine  25 mcg Oral QAC breakfast  . metoCLOPramide  5 mg Oral QID  . pravastatin  80 mg Oral QHS  .  sodium chloride  3 mL Intravenous Q12H  . vancomycin  750 mg Intravenous Q24H  . warfarin  2.5 mg Oral Once  . Warfarin - Pharmacist Dosing Inpatient   Does not apply Q24H   Continuous Infusions:  PRN Meds:.sodium chloride, acetaminophen **OR** acetaminophen, ondansetron **OR** ondansetron (ZOFRAN) IV, ondansetron, oxyCODONE-acetaminophen, polyethylene glycol, sodium chloride   Antibiotics   Anti-infectives    Start     Dose/Rate Route Frequency Ordered Stop   12/09/14 0600  levofloxacin (LEVAQUIN) IVPB 750 mg  Status:  Discontinued     750 mg 100 mL/hr over 90 Minutes Intravenous Every 48 hours 12/07/14 1124 12/08/14 1210   12/08/14 1400  ampicillin (OMNIPEN) 2 g in sodium chloride 0.9 % 50 mL IVPB  Status:  Discontinued     2 g 150 mL/hr over 20 Minutes Intravenous 3 times per day 12/08/14 1222 12/09/14 1037   12/08/14 1000  vancomycin (VANCOCIN) IVPB 750 mg/150 ml premix     750 mg 150 mL/hr over 60 Minutes Intravenous Every 24 hours 12/07/14  1115     12/07/14 1030  vancomycin (VANCOCIN) 1,500 mg in sodium chloride 0.9 % 500 mL IVPB     1,500 mg 250 mL/hr over 120 Minutes Intravenous  Once 12/07/14 0920 12/07/14 1359   12/07/14 0100  levofloxacin (LEVAQUIN) IVPB 750 mg     750 mg 100 mL/hr over 90 Minutes Intravenous  Once 12/07/14 0053 12/07/14 0301   12/06/14 2300  cefTRIAXone (ROCEPHIN) 1 g in dextrose 5 % 50 mL IVPB     1 g 100 mL/hr over 30 Minutes Intravenous  Once 12/06/14 2254 12/06/14 2338        Subjective:   [name] was seen and examined today.   Patient denies dizziness, chest pain, shortness of breath, abdominal pain, N/V/D/C, new weakness, numbess, tingling. No acute events overnight.   afebrile this morning   Objective:   Blood pressure 138/66, pulse 70, temperature 98.2 F (36.8 C), temperature source Oral, resp. rate 18, height 5\' 4"  (1.626 m), weight 67.541 kg (148 lb 14.4 oz), SpO2 98 %.  Wt Readings from Last 3 Encounters:  12/09/14 67.541 kg (148 lb 14.4 oz)  12/04/14 67.699 kg (149 lb 4 oz)  12/02/14 69.1 kg (152 lb 5.4 oz)     Intake/Output Summary (Last 24 hours) at 12/09/14 1205 Last data filed at 12/09/14 0825  Gross per 24 hour  Intake   1800 ml  Output    200 ml  Net   1600 ml    Exam  General: Alert and oriented x 3, NAD  HEENT:  PERRLA, EOMI, Anicteric Sclera, mucous membranes moist.   Neck: Supple, no JVD, no masses  CVS: S1 S2 auscultated, 2/6 SM   Respiratory: Decreased breath sounds at the bases   Abdomen: Soft, nontender, nondistended, + bowel sounds  Ext: no cyanosis clubbing or edema  Neuro: AAOx3, Cr N's II- XII. Strength 5/5 upper and lower extremities bilaterally  Skin: No rashes  Psych: Normal affect and demeanor, alert and oriented x3    Data Review   Micro Results Recent Results (from the past 240 hour(s))  Rapid strep screen   (If patient has fever and/or without cough or runny nose)     Status: None   Collection Time: 11/30/14  8:05 AM    Result Value Ref Range Status   Streptococcus, Group A Screen (Direct) NEGATIVE NEGATIVE Final    Comment: (NOTE) A Rapid Antigen test may result negative if the  antigen level in the sample is below the detection level of this test. The FDA has not cleared this test as a stand-alone test therefore the rapid antigen negative result has reflexed to a Group A Strep culture.   Culture, Group A Strep     Status: None   Collection Time: 11/30/14  8:05 AM  Result Value Ref Range Status   Strep A Culture Negative  Final    Comment: (NOTE) Performed At: [facility] [place], [place] [id number] Lindon Romp MD YC:[id number]   MRSA PCR Screening     Status: Abnormal   Collection Time: 11/30/14  1:19 PM  Result Value Ref Range Status   MRSA by PCR POSITIVE (A) NEGATIVE Final    Comment:        The GeneXpert MRSA Assay (FDA approved for NASAL specimens only), is one component of a comprehensive MRSA colonization surveillance program. It is not intended to diagnose MRSA infection nor to guide or monitor treatment for MRSA infections. RESULT CALLED TO, READ BACK BY AND VERIFIED WITH: BOWIE,C RN @ 4970 11/30/14 LEONARDA,   Respiratory virus panel     Status: Abnormal   Collection Time: 11/30/14  1:31 PM  Result Value Ref Range Status   Respiratory Syncytial Virus A Negative Negative Final   Respiratory Syncytial Virus B Negative Negative Final   Influenza A Negative Negative Final   Influenza B Negative Negative Final   Parainfluenza 1 Negative Negative Final   Parainfluenza 2 Negative Negative Final   Parainfluenza 3 Negative Negative Final   Metapneumovirus Negative Negative Final   Rhinovirus Positive (A) Negative Final   Adenovirus Negative Negative Final    Comment: (NOTE) Performed At: [facility] [address] [place], [place] [id number] Lindon Romp MD OY:[id number]   Urine culture     Status: None   Collection Time: 11/30/14  1:33  PM  Result Value Ref Range Status   Specimen Description URINE, RANDOM  Final   Special Requests NONE  Final   Colony Count   Final    9,000 COLONIES/ML Performed at [facility]    Culture   Final    INSIGNIFICANT GROWTH Performed at [facility]    Report Status 12/01/2014 FINAL  Final  Culture, blood (routine x 2)     Status: None   Collection Time: 11/30/14  7:12 PM  Result Value Ref Range Status   Specimen Description BLOOD RIGHT ARM  Final   Special Requests   Final    BOTTLES DRAWN AEROBIC AND ANAEROBIC 10CC BLUE 5CC RED   Culture   Final    ENTEROCOCCUS SPECIES Note: REPORT FAXED BY REQUEST Note: Gram Stain Report Called to,Read Back By and Verified With: CECILIA POWELL 12/04/14 1425 BY SMITHERSJ Performed at [facility]    Report Status 12/09/2014 FINAL  Final   Organism ID, Bacteria ENTEROCOCCUS SPECIES  Final      Susceptibility   Enterococcus species - MIC*    AMPICILLIN RESISTANT      VANCOMYCIN <=0.5 SENSITIVE Sensitive     * ENTEROCOCCUS SPECIES  Culture, blood (routine x 2)     Status: None   Collection Time: 11/30/14  7:15 PM  Result Value Ref Range Status   Specimen Description BLOOD LEFT WRIST  Final   Special Requests BOTTLES DRAWN AEROBIC AND ANAEROBIC 10CC EACH  Final   Culture   Final    ENTEROCOCCUS SPECIES Note: SUSCEPTIBILITIES PERFORMED ON PREVIOUS CULTURE WITHIN  THE LAST 5 DAYS. Performed at [facility]    Report Status 12/09/2014 FINAL  Final  Blood culture (routine x 2)     Status: None   Collection Time: 12/06/14 10:00 PM  Result Value Ref Range Status   Specimen Description BLOOD RIGHT ARM  Final   Special Requests BOTTLES DRAWN AEROBIC AND ANAEROBIC 10CC EACH  Final   Culture  Setup Time   Final    GRAM POSITIVE COCCI IN PAIRS AND CHAINS Gram Stain Report Called to,Read Back By and Verified With: JAMES,T. AT 8144 ON 12/07/2014 BY BAUGHAM,M Performed at [facility]    Culture   Final     ENTEROCOCCUS SPECIES Performed at [facility]    Report Status 12/09/2014 FINAL  Final   Organism ID, Bacteria ENTEROCOCCUS SPECIES  Final      Susceptibility   Enterococcus species - MIC*    AMPICILLIN <=2 SENSITIVE Sensitive     VANCOMYCIN 1 SENSITIVE Sensitive     GENTAMICIN SYNERGY SENSITIVE Sensitive     LINEZOLID 2 SENSITIVE Sensitive     * ENTEROCOCCUS SPECIES  Blood culture (routine x 2)     Status: None   Collection Time: 12/06/14 10:06 PM  Result Value Ref Range Status   Specimen Description BLOOD RIGHT HAND  Final   Special Requests BOTTLES DRAWN AEROBIC AND ANAEROBIC 10CC EACH  Final   Culture  Setup Time   Final    GRAM POSITIVE COCCI IN PAIRS AND CHAINS Gram Stain Report Called to,Read Back By and Verified With: JAMES,T. AT 8185 ON 12/07/2014 BY BAUGHAM,M. Performed at [facility]    Culture   Final    ENTEROCOCCUS SPECIES SUSCEPTIBILITIES PERFORMED ON PREVIOUS CULTURE WITHIN THE LAST 5 DAYS. Performed at [facility]    Report Status 12/09/2014 FINAL  Final  Culture, Urine     Status: None   Collection Time: 12/06/14 10:16 PM  Result Value Ref Range Status   Specimen Description URINE, CATHETERIZED  Final   Special Requests NONE  Final   Culture   Final    NO GROWTH 2 DAYS Performed at [facility]    Report Status 12/09/2014 FINAL  Final  MRSA PCR Screening     Status: None   Collection Time: 12/07/14 12:25 AM  Result Value Ref Range Status   MRSA by PCR NEGATIVE NEGATIVE Final    Comment:        The GeneXpert MRSA Assay (FDA approved for NASAL specimens only), is one component of a comprehensive MRSA colonization surveillance program. It is not intended to diagnose MRSA infection nor to guide or monitor treatment for MRSA infections.   Culture, blood (routine x 2)     Status: None (Preliminary result)   Collection Time: 12/08/14 12:57 PM  Result Value Ref Range Status   Specimen Description BLOOD LEFT HAND  Final     Special Requests   Final    Immunocompromised BOTTLES DRAWN AEROBIC AND ANAEROBIC 6 CC EACH BOTTLE   Culture PENDING  Incomplete   Report Status PENDING  Incomplete  Culture, blood (routine x 2)     Status: None (Preliminary result)   Collection Time: 12/08/14  1:00 PM  Result Value Ref Range Status   Specimen Description BLOOD RIGHT HAND  Final   Special Requests   Final    Immunocompromised BOTTLES DRAWN AEROBIC AND ANAEROBIC 6 CC EACH BOTTLE   Culture PENDING  Incomplete   Report Status PENDING  Incomplete    Radiology Reports Dg Chest 2 View  12/06/2014   CLINICAL DATA:  Acute onset of shortness of breath. Initial encounter.  EXAM: CHEST  2 VIEW  COMPARISON:  Chest radiograph performed 11/30/2014  FINDINGS: The lungs are well-aerated. Trace fluid is suggested along the right minor fissure. Minimal bibasilar atelectasis is noted. There is no evidence of pleural effusion or pneumothorax.  The heart is borderline enlarged. A pacemaker/AICD is noted at the right chest wall, with leads ending at the right atrium, right ventricle and coronary sinus. No acute osseous abnormalities are seen.  IMPRESSION: Trace fluid again suggested along the right minor fissure. Borderline cardiomegaly. Minimal bibasilar atelectasis noted.   Electronically Signed   By: Garald Balding M.D.   On: 12/06/2014 23:03   Dg Chest 2 View  11/30/2014   CLINICAL DATA:  Shortness of breath.  EXAM: CHEST  2 VIEW  COMPARISON:  10/17/2014.  01/10/2014.  FINDINGS: Mediastinum hilar structures are normal. Cardiac pacer with lead tips in right atrium right ventricle. Cardiomegaly. Mild bilateral interstitial prominence with Kerley B-lines noted. These findings are most consistent mild congestive heart failure. Tiny left pleural effusion cannot be excluded. No pneumothorax. Degenerative changes both shoulders.  IMPRESSION: Findings consistent with mild congestive heart failure with pulmonary interstitial edema and tiny left pleural  effusion. Cardiac pacer noted in stable position.   Electronically Signed   By: Marcello Moores  Register   On: 11/30/2014 08:42   US Renal  11/30/2014   CLINICAL DATA:  Chronic kidney disease stage 4  EXAM: RENAL / URINARY TRACT ULTRASOUND COMPLETE  COMPARISON:  01/17/2014  FINDINGS: Right Kidney:  Length: 9.6 cm. Extrarenal pelvis on the right similar to prior study. 12 mm cyst midpole. Increased renal echogenicity.  Left Kidney:  Length: 11.1 cm. 12 mm upper pole cyst. Increased echogenicity of the renal cortex.  Bladder:  Appears normal for degree of bladder distention.  IMPRESSION: Medical renal disease.   Electronically Signed   By: Skipper Cliche M.D.   On: 11/30/2014 17:22    CBC  Recent Labs Lab 12/06/14 2130 12/07/14 0628 12/08/14 0538  WBC 9.6 11.1* 6.4  HGB 10.1* 8.4* 9.0*  HCT 30.7* 25.4* 27.4*  PLT 179 143* 136*  MCV 95.0 96.2 95.8  MCH 31.3 31.8 31.5  MCHC 32.9 33.1 32.8  RDW 15.7* 16.3* 16.2*  LYMPHSABS 0.8  --   --   MONOABS 0.4  --   --   EOSABS 0.1  --   --   BASOSABS 0.0  --   --     Chemistries   Recent Labs Lab 12/06/14 2130 12/07/14 0628 12/08/14 0538  NA 135 138 139  K 4.3 3.8 4.2  CL 99* 101 104  CO2 25 26 27   GLUCOSE 193* 279* 116*  BUN 52* 53* 43*  CREATININE 2.11* 2.18* 1.78*  CALCIUM 8.9 8.5* 8.8*   ------------------------------------------------------------------------------------------------------------------ estimated creatinine clearance is 27.4 mL/min (by C-G formula based on Cr of 1.78). ------------------------------------------------------------------------------------------------------------------ No results for input(s): HGBA1C in the last 72 hours. ------------------------------------------------------------------------------------------------------------------ No results for input(s): CHOL, HDL, LDLCALC, TRIG, CHOLHDL, LDLDIRECT in the last 72  hours. ------------------------------------------------------------------------------------------------------------------ No results for input(s): TSH, T4TOTAL, T3FREE, THYROIDAB in the last 72 hours.  Invalid input(s): FREET3 ------------------------------------------------------------------------------------------------------------------ No results for input(s): VITAMINB12, FOLATE, FERRITIN, TIBC, IRON, RETICCTPCT in the last 72 hours.  Coagulation profile  Recent Labs Lab 12/06/14 2131 12/07/14 0628 12/08/14 0538  INR 1.26 1.44 1.40    No results for input(s): DDIMER  in the last 72 hours.  Cardiac Enzymes No results for input(s): CKMB, TROPONINI, MYOGLOBIN in the last 168 hours.  Invalid input(s): CK ------------------------------------------------------------------------------------------------------------------ Invalid input(s): [place]  12/08/14 1544 12/08/14 1613 12/08/14 1626 12/08/14 2120 12/09/14 0627 12/09/14 1153  GLUCAP 60* 110* 91 107* 95 221*     Riaz Onorato M.D. [facility] 12/09/2014, 12:05 PM  Pager: [phone]   Between 7am to 7pm - call Pager - [phone]  After 7pm go to [email or web address] - password [email or web address]  Call night coverage person covering after 7pm

## 2014-12-09 NOTE — Progress Notes (Signed)
Patient ID: [name], female   DOB: 03-27-1946, 69 y.o.   MRN: [id number]    Patient Name: [name] Date of Encounter: 12/09/2014     Principal Problem:   Bacteremia Active Problems:   HTN (hypertension)   History of pulmonary embolism   Type 2 diabetes, uncontrolled, with renal manifestation   CKD (chronic kidney disease), stage IV   Chronic combined systolic and diastolic CHF (congestive heart failure)   Chronic anticoagulation   Atrial fibrillation   Implantable cardioverter-defibrillator-CRT- Mdt   COPD (chronic obstructive pulmonary disease)   Anemia of chronic disease   Fever   SOB (shortness of breath)    SUBJECTIVE  Fever and chills are improved. No chest pain or sob.   CURRENT MEDS . amiodarone  200 mg Oral Daily  . ampicillin (OMNIPEN) IV  2 g Intravenous 3 times per day  . antiseptic oral rinse  7 mL Mouth Rinse BID  . carvedilol  9.375 mg Oral BID WC  . colchicine  0.6 mg Oral BID  . digoxin  0.0625 mg Oral QODAY  . feeding supplement (ENSURE ENLIVE)  237 mL Oral BID BM  . glimepiride  2 mg Oral Daily  . insulin aspart  0-15 Units Subcutaneous TID WC  . insulin aspart  0-5 Units Subcutaneous QHS  . isosorbide mononitrate  30 mg Oral Daily  . levothyroxine  25 mcg Oral QAC breakfast  . metoCLOPramide  5 mg Oral QID  . pravastatin  80 mg Oral QHS  . sodium chloride  3 mL Intravenous Q12H  . vancomycin  750 mg Intravenous Q24H  . warfarin  2.5 mg Oral Once  . Warfarin - Pharmacist Dosing Inpatient   Does not apply Q24H    OBJECTIVE  Filed Vitals:   12/08/14 1656 12/08/14 2001 12/09/14 0223 12/09/14 0610  BP: 140/60 131/65 136/64 138/66  Pulse: 68 70 73 70  Temp:  98.4 F (36.9 C) 98.1 F (36.7 C) 98.2 F (36.8 C)  TempSrc:  Oral Oral Oral  Resp:  18 20 18   Height:      Weight:    148 lb 14.4 oz (67.541 kg)  SpO2:  96% 98% 98%    Intake/Output Summary (Last 24 hours) at 12/09/14 0856 Last data filed at 12/09/14 0825  Gross per 24  hour  Intake   1800 ml  Output    202 ml  Net   1598 ml   Filed Weights   12/08/14 0559 12/08/14 1358 12/09/14 0610  Weight: 149 lb 3.2 oz (67.677 kg) 149 lb (67.586 kg) 148 lb 14.4 oz (67.541 kg)    PHYSICAL EXAM  General: Pleasant, NAD. Neuro: Alert and oriented X 3. Moves all extremities spontaneously. Psych: Normal affect. HEENT:  Normal  Neck: Supple without bruits or JVD. Lungs:  Resp regular and unlabored, CTA. Heart: RRR no s3, s4, or murmurs. Abdomen: Soft, non-tender, non-distended, BS + x 4.  Extremities: No clubbing, cyanosis or edema. DP/PT/Radials 2+ and equal bilaterally.  Accessory Clinical Findings  CBC  Recent Labs  12/06/14 2130 12/07/14 0628 12/08/14 0538  WBC 9.6 11.1* 6.4  NEUTROABS 8.3*  --   --   HGB 10.1* 8.4* 9.0*  HCT 30.7* 25.4* 27.4*  MCV 95.0 96.2 95.8  PLT 179 143* 431*   Basic Metabolic Panel  Recent Labs  12/07/14 0628 12/08/14 0538  NA 138 139  K 3.8 4.2  CL 101 104  CO2 26 27  GLUCOSE 279* 116*  BUN  53* 43*  CREATININE 2.18* 1.78*  CALCIUM 8.5* 8.8*   Liver Function Tests No results for input(s): AST, ALT, ALKPHOS, BILITOT, PROT, ALBUMIN in the last 72 hours. No results for input(s): LIPASE, AMYLASE in the last 72 hours. Cardiac Enzymes No results for input(s): CKTOTAL, CKMB, CKMBINDEX, TROPONINI in the last 72 hours. BNP Invalid input(s): POCBNP D-Dimer No results for input(s): DDIMER in the last 72 hours. Hemoglobin A1C No results for input(s): HGBA1C in the last 72 hours. Fasting Lipid Panel No results for input(s): CHOL, HDL, LDLCALC, TRIG, CHOLHDL, LDLDIRECT in the last 72 hours. Thyroid Function Tests No results for input(s): TSH, T4TOTAL, T3FREE, THYROIDAB in the last 72 hours.  Invalid input(s): FREET3  TELE NSR with biv pacing  Radiology/Studies  Dg Chest 2 View  12/06/2014   CLINICAL DATA:  Acute onset of shortness of breath. Initial encounter.  EXAM: CHEST  2 VIEW  COMPARISON:  Chest radiograph  performed 11/30/2014  FINDINGS: The lungs are well-aerated. Trace fluid is suggested along the right minor fissure. Minimal bibasilar atelectasis is noted. There is no evidence of pleural effusion or pneumothorax.  The heart is borderline enlarged. A pacemaker/AICD is noted at the right chest wall, with leads ending at the right atrium, right ventricle and coronary sinus. No acute osseous abnormalities are seen.  IMPRESSION: Trace fluid again suggested along the right minor fissure. Borderline cardiomegaly. Minimal bibasilar atelectasis noted.   Electronically Signed   By: Garald Balding M.D.   On: 12/06/2014 23:03   Dg Chest 2 View  11/30/2014   CLINICAL DATA:  Shortness of breath.  EXAM: CHEST  2 VIEW  COMPARISON:  10/17/2014.  01/10/2014.  FINDINGS: Mediastinum hilar structures are normal. Cardiac pacer with lead tips in right atrium right ventricle. Cardiomegaly. Mild bilateral interstitial prominence with Kerley B-lines noted. These findings are most consistent mild congestive heart failure. Tiny left pleural effusion cannot be excluded. No pneumothorax. Degenerative changes both shoulders.  IMPRESSION: Findings consistent with mild congestive heart failure with pulmonary interstitial edema and tiny left pleural effusion. Cardiac pacer noted in stable position.   Electronically Signed   By: Marcello Moores  Register   On: 11/30/2014 08:42   US Renal  11/30/2014   CLINICAL DATA:  Chronic kidney disease stage 4  EXAM: RENAL / URINARY TRACT ULTRASOUND COMPLETE  COMPARISON:  01/17/2014  FINDINGS: Right Kidney:  Length: 9.6 cm. Extrarenal pelvis on the right similar to prior study. 12 mm cyst midpole. Increased renal echogenicity.  Left Kidney:  Length: 11.1 cm. 12 mm upper pole cyst. Increased echogenicity of the renal cortex.  Bladder:  Appears normal for degree of bladder distention.  IMPRESSION: Medical renal disease.   Electronically Signed   By: Skipper Cliche M.D.   On: 11/30/2014 17:22    ASSESSMENT AND  PLAN  1. Enterococcal bacteremia 2. ICD lead endocarditis 3. Chronic systolic heart failure Rec: The patient been stable until a few weeks ago. She had undergone colonoscopy 6 weeks ago. She will continue her IV anti-biotics and will plan for ICD system extraction next week as our schedule permits.  Pegge Cumberledge,M.D.  12/09/2014 8:56 AM

## 2014-12-09 NOTE — Consult Note (Addendum)
[facility]    Date of Admission:  12/06/2014  Date of Consult:  12/09/2014  Reason for Consult:  Enterococcal bacteremia with ICD infection Referring Physician: Dr. Tana Coast   HPI: [name] is an 69 y.o. 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 I do not have access via Epic to nature of enterococcal infection from 2007 (ie was it AMP R as well) and which abx were used at that time. Regardless she apparently had done well for many years but due to worsening heart failure had new ICD with BiV device was placed in 2014. She had done well but then recently was having trouble with anemia requiring frequent blood transfusions and underwent upper EGD and colonoscopy and capsule endoscopy  as part of workup. There were diverticula seen on colonoscopy with some blood. Patient had been placed on procrit for ACD. She developed a fever during her most recent admission and workup was negative until blood cultures drawn from the 9th came back + for Enterococcus finally today found to be AMP R but VAN S  She had worsening fever, chills malaise and came to [facility] ED and was admitted and repeat blood cultures are again + for Enterococcus but this time read as S to AMP, Vancomcycin and gentamicin. She had TEE which did not show vegetation on her native valve but did show one on her AICD. She has been seen by Dr Lovena Le who is planning lead extraction on Wednesday.     Past Medical History  Diagnosis Date  . Cardiomyopathy, nonischemic     a. 1999 nl cath;  b. 12/05 [facility] [place];  c. 10/2005 ICD extraction 2/2 enterococcus bacteremia and Veg on RV lead;  c. 05/2008 low risk Myoview (scarring w/ some evidence of inf ischemia);  d. 11/2012 Echo: EF  15-20%;  e. 01/2013 s/p MDT [place] CRT D, ser # [id number];  f. 05/2013 Echo: EF 15%.  . Enterococcal infection     a. 10/2005 - AICD-explanted  . Pulmonary embolism     a. 07/2005 after total right hip arthroplasty  . Hilar density     a. infrahilar mass/adenopathy on CT scan 5/07; subsequently  resolved  . LBBB (left bundle branch block)   . GERD (gastroesophageal reflux disease)   . Hyperlipidemia   . Hypertension   . Tobacco abuse     a. discontinued in 1997, and then resumed  . Urinary incontinence   . Anemia     a. mild/chronic  . Villous adenoma of colon     a. tubovillous adenomatous polyp with focal high grade dysplasia; presented with hematochezia - followed by Dr. Laural Golden.  . Implantable cardioverter-defibrillator-CRT- Mdt     a.  01/2013 s/p MDT [place] CRT D, ser # [id number]  . Chronic systolic CHF (congestive heart failure)     a. 11/2012 Echo: EF 15-20%;  b. 05/2013 TEE EF 15%.  . Pneumonia 07/2005  . Obstructive sleep apnea     a. mild-did not tolerate CPAP (01/24/2013)  . History of blood transfusion   . Degenerative joint disease     of knees, shoulder, and hips  . PAF (paroxysmal atrial fibrillation)     a. 07/2012 s/p TEE/DCCV;  b. chronic coumadin;  c. 05/2013 Recurrent Afib->TEE/DCCV and amio initiation.  . CKD (chronic kidney disease), stage III     creatinin-1.44 in 1/09; 1.51 in 1/10  . Type II diabetes mellitus     type 2    Past Surgical History  Procedure Laterality Date  . Pacemaker removal  11/18/05    Enterococcal infection  . Total hip arthroplasty Right 07/2005  . Knee arthroscopy Right 1980's?  . Colonoscopy w/ polypectomy  2009  . Cardioversion N/A 08/20/2012    Procedure: TEE GUIDED CARDIOVERSION;  Surgeon: Yehuda Savannah, MD;  Location: [facility] ORS;  Service: Cardiovascular;  Laterality: N/A;  To be done @ bedside  . Tee without cardioversion N/A 08/20/2012    Procedure: TRANSESOPHAGEAL ECHOCARDIOGRAM (TEE);  Surgeon: Yehuda Savannah, MD;   Location: [facility];  Service: Cardiovascular;  Laterality: N/A;  . Bi-ventricular implantable cardioverter defibrillator  (crt-d)  01/24/2013  . A-v cardiac pacemaker insertion  12/05    Biventricular pacemaker/AICD  . Abdominal hysterectomy  1990/92    Initial partial hysterectomy followed by BSO  . Tubal ligation  1980's  . Cardiac catheterization    . Colonoscopy with esophagogastroduodenoscopy (egd) N/A 06/10/2013    Procedure: COLONOSCOPY WITH ESOPHAGOGASTRODUODENOSCOPY (EGD);  Surgeon: Rogene Houston, MD;  Location: [facility] ENDO SUITE;  Service: Endoscopy;  Laterality: N/A;  925  . Tee without cardioversion N/A 06/20/2013    Procedure: TRANSESOPHAGEAL ECHOCARDIOGRAM (TEE);  Surgeon: Dorothy Spark, MD;  Location: [facility];  Service: Cardiovascular;  Laterality: N/A;  . Cardioversion N/A 06/20/2013    Procedure: CARDIOVERSION;  Surgeon: Dorothy Spark, MD;  Location: [facility] ENDOSCOPY;  Service: Cardiovascular;  Laterality: N/A;  . Bi-ventricular implantable cardioverter defibrillator N/A 01/24/2013    Procedure: BI-VENTRICULAR IMPLANTABLE CARDIOVERTER DEFIBRILLATOR  (CRT-D);  Surgeon: Evans Lance, MD;  Location: [facility] CATH LAB;  Service: Cardiovascular;  Laterality: N/A;  . Right heart catheterization N/A 04/18/2014    Procedure: RIGHT HEART CATH;  Surgeon: Larey Dresser, MD;  Location: [facility] CATH LAB;  Service: Cardiovascular;  Laterality: N/A;  . Esophagogastroduodenoscopy N/A 10/21/2014    Procedure: ESOPHAGOGASTRODUODENOSCOPY (EGD);  Surgeon: Inda Castle, MD;  Location: [facility];  Service: Endoscopy;  Laterality: N/A;  . Colonoscopy Left 10/24/2014    Procedure: COLONOSCOPY;  Surgeon: Carol Ada, MD;  Location: [facility] ENDOSCOPY;  Service: Endoscopy;  Laterality: Left;  Freda Munro capsule study N/A 10/24/2014    Procedure: GIVENS CAPSULE STUDY;  Surgeon: Carol Ada, MD;  Location: [facility] ENDOSCOPY;  Service: Endoscopy;  Laterality: N/A;  ergies:   No Known Allergies   Medications: I have  reviewed patients current medications as documented in Epic Anti-infectives    Start     Dose/Rate Route Frequency Ordered Stop   12/09/14 0600  levofloxacin (LEVAQUIN) IVPB 750 mg  Status:  Discontinued     750 mg 100 mL/hr over 90 Minutes Intravenous Every 48 hours 12/07/14 1124 12/08/14 1210   12/08/14 1400  ampicillin (OMNIPEN) 2 g in sodium chloride 0.9 % 50 mL IVPB  Status:  Discontinued     2 g 150 mL/hr over 20 Minutes Intravenous 3 times per day 12/08/14 1222 12/09/14 1037   12/08/14 1000  vancomycin (VANCOCIN) IVPB 750 mg/150 ml premix     750 mg 150 mL/hr over 60 Minutes Intravenous Every 24 hours 12/07/14 1115     12/07/14 1030  vancomycin (VANCOCIN) 1,500 mg in sodium chloride 0.9 % 500 mL IVPB     1,500 mg 250 mL/hr  over 120 Minutes Intravenous  Once 12/07/14 0920 12/07/14 1359   12/07/14 0100  levofloxacin (LEVAQUIN) IVPB 750 mg     750 mg 100 mL/hr over 90 Minutes Intravenous  Once 12/07/14 0053 12/07/14 0301   12/06/14 2300  cefTRIAXone (ROCEPHIN) 1 g in dextrose 5 % 50 mL IVPB     1 g 100 mL/hr over 30 Minutes Intravenous  Once 12/06/14 2254 12/06/14 2338      Social History:  reports that she has quit smoking. Her smoking use included Cigarettes. She quit after 12 years of use. She has never used smokeless tobacco. She reports that she does not drink alcohol or use illicit drugs.  Family History  Problem Relation Age of Onset  . Hypertension Mother   . Diabetes Mother   . Coronary artery disease Father   . Diabetes Brother   . Hypertension Brother   . Lung cancer Brother   . Arthritis Other   . Diabetes Other   . Heart disease Other     female < 73    As in HPI and primary teams notes otherwise 12 point review of systems is negative  Blood pressure 127/59, pulse 71, temperature 98.7 F (37.1 C), temperature source Oral, resp. rate 18, height '5\' 4"'  (1.626 m), weight 148 lb 14.4 oz (67.541 kg), SpO2 99 %. General: Alert and awake, oriented x3, not in any  acute distress. HEENT: anicteric sclera, pupils reactive to light and accommodation, EOMI, oropharynx clear and without exudate CVS regular rate, normal r,  no murmur rubs or gallops Chest: dec breath sounds at bases Abdomen: soft nontender, nondistended, normal bowel sounds, Extremities: 2+ edema Skin:   No diabetic foot ulcers  Neuro: nonfocal, strength and sensation intact   Results for orders placed or performed during the hospital encounter of 12/06/14 (from the past 48 hour(s))  Glucose, capillary     Status: Abnormal   Collection Time: 12/07/14  8:37 PM  Result Value Ref Range   Glucose-Capillary 157 (H) 65 - 99 mg/dL   Comment 1 Notify RN    Comment 2 Document in Chart   Basic metabolic panel     Status: Abnormal   Collection Time: 12/08/14  5:38 AM  Result Value Ref Range   Sodium 139 135 - 145 mmol/L   Potassium 4.2 3.5 - 5.1 mmol/L   Chloride 104 101 - 111 mmol/L   CO2 27 22 - 32 mmol/L   Glucose, Bld 116 (H) 65 - 99 mg/dL   BUN 43 (H) 6 - 20 mg/dL   Creatinine, Ser 1.78 (H) 0.44 - 1.00 mg/dL   Calcium 8.8 (L) 8.9 - 10.3 mg/dL   GFR calc non Af Amer 28 (L) >60 mL/min   GFR calc Af Amer 32 (L) >60 mL/min    Comment: (NOTE) The eGFR has been calculated using the CKD EPI equation. This calculation has not been validated in all clinical situations. eGFR's persistently <60 mL/min signify possible Chronic Kidney Disease.    Anion gap 8 5 - 15  Protime-INR     Status: Abnormal   Collection Time: 12/08/14  5:38 AM  Result Value Ref Range   Prothrombin Time 17.3 (H) 11.6 - 15.2 seconds   INR 1.40 0.00 - 1.49  CBC     Status: Abnormal   Collection Time: 12/08/14  5:38 AM  Result Value Ref Range   WBC 6.4 4.0 - 10.5 K/uL   RBC 2.86 (L) 3.87 - 5.11 MIL/uL   Hemoglobin 9.0 (  L) 12.0 - 15.0 g/dL   HCT 27.4 (L) 36.0 - 46.0 %   MCV 95.8 78.0 - 100.0 fL   MCH 31.5 26.0 - 34.0 pg   MCHC 32.8 30.0 - 36.0 g/dL   RDW 16.2 (H) 11.5 - 15.5 %   Platelets 136 (L) 150 - 400  K/uL  Glucose, capillary     Status: Abnormal   Collection Time: 12/08/14  7:56 AM  Result Value Ref Range   Glucose-Capillary 138 (H) 65 - 99 mg/dL   Comment 1 Notify RN    Comment 2 Document in Chart   Glucose, capillary     Status: Abnormal   Collection Time: 12/08/14 11:19 AM  Result Value Ref Range   Glucose-Capillary 330 (H) 65 - 99 mg/dL   Comment 1 Notify RN    Comment 2 Document in Chart   Culture, blood (routine x 2)     Status: None (Preliminary result)   Collection Time: 12/08/14 12:57 PM  Result Value Ref Range   Specimen Description BLOOD LEFT HAND    Special Requests      Immunocompromised BOTTLES DRAWN AEROBIC AND ANAEROBIC 6 CC EACH BOTTLE   Culture PENDING    Report Status PENDING   Culture, blood (routine x 2)     Status: None (Preliminary result)   Collection Time: 12/08/14  1:00 PM  Result Value Ref Range   Specimen Description BLOOD RIGHT HAND    Special Requests      Immunocompromised BOTTLES DRAWN AEROBIC AND ANAEROBIC 6 CC EACH BOTTLE   Culture PENDING    Report Status PENDING   Glucose, capillary     Status: Abnormal   Collection Time: 12/08/14  2:05 PM  Result Value Ref Range   Glucose-Capillary 115 (H) 65 - 99 mg/dL  Glucose, capillary     Status: Abnormal   Collection Time: 12/08/14  3:44 PM  Result Value Ref Range   Glucose-Capillary 53 (L) 65 - 99 mg/dL   Comment 1 Notify RN   Glucose, capillary     Status: Abnormal   Collection Time: 12/08/14  4:13 PM  Result Value Ref Range   Glucose-Capillary 110 (H) 65 - 99 mg/dL   Comment 1 Notify RN   Glucose, capillary     Status: None   Collection Time: 12/08/14  4:26 PM  Result Value Ref Range   Glucose-Capillary 91 65 - 99 mg/dL   Comment 1 Notify RN   Glucose, capillary     Status: Abnormal   Collection Time: 12/08/14  9:20 PM  Result Value Ref Range   Glucose-Capillary 107 (H) 65 - 99 mg/dL  Glucose, capillary     Status: None   Collection Time: 12/09/14  6:27 AM  Result Value Ref Range     Glucose-Capillary 95 65 - 99 mg/dL  Glucose, capillary     Status: Abnormal   Collection Time: 12/09/14 11:53 AM  Result Value Ref Range   Glucose-Capillary 221 (H) 65 - 99 mg/dL   Comment 1 Notify RN   Protime-INR     Status: Abnormal   Collection Time: 12/09/14  1:35 PM  Result Value Ref Range   Prothrombin Time 17.3 (H) 11.6 - 15.2 seconds   INR 1.40 0.00 - 1.49  Glucose, capillary     Status: None   Collection Time: 12/09/14  4:27 PM  Result Value Ref Range   Glucose-Capillary 72 65 - 99 mg/dL   '@BRIEFLABTABLE' (sdes,specrequest,cult,reptstatus)   ) Recent Results (from the past  720 hour(s))  Rapid strep screen   (If patient has fever and/or without cough or runny nose)     Status: None   Collection Time: 11/30/14  8:05 AM  Result Value Ref Range Status   Streptococcus, Group A Screen (Direct) NEGATIVE NEGATIVE Final    Comment: (NOTE) A Rapid Antigen test may result negative if the antigen level in the sample is below the detection level of this test. The FDA has not cleared this test as a stand-alone test therefore the rapid antigen negative result has reflexed to a Group A Strep culture.   Culture, Group A Strep     Status: None   Collection Time: 11/30/14  8:05 AM  Result Value Ref Range Status   Strep A Culture Negative  Final    Comment: (NOTE) Performed At: [facility] [place], [place] [id number] Lindon Romp MD YP:[id number]   MRSA PCR Screening     Status: Abnormal   Collection Time: 11/30/14  1:19 PM  Result Value Ref Range Status   MRSA by PCR POSITIVE (A) NEGATIVE Final    Comment:        The GeneXpert MRSA Assay (FDA approved for NASAL specimens only), is one component of a comprehensive MRSA colonization surveillance program. It is not intended to diagnose MRSA infection nor to guide or monitor treatment for MRSA infections. RESULT CALLED TO, READ BACK BY AND VERIFIED WITH: BOWIE,C RN @ 4580 11/30/14 LEONARDA,    Respiratory virus panel     Status: Abnormal   Collection Time: 11/30/14  1:31 PM  Result Value Ref Range Status   Respiratory Syncytial Virus A Negative Negative Final   Respiratory Syncytial Virus B Negative Negative Final   Influenza A Negative Negative Final   Influenza B Negative Negative Final   Parainfluenza 1 Negative Negative Final   Parainfluenza 2 Negative Negative Final   Parainfluenza 3 Negative Negative Final   Metapneumovirus Negative Negative Final   Rhinovirus Positive (A) Negative Final   Adenovirus Negative Negative Final    Comment: (NOTE) Performed At: [facility] [address] [place], [place] [id number] Lindon Romp MD NL:[id number]   Urine culture     Status: None   Collection Time: 11/30/14  1:33 PM  Result Value Ref Range Status   Specimen Description URINE, RANDOM  Final   Special Requests NONE  Final   Colony Count   Final    9,000 COLONIES/ML Performed at [facility]    Culture   Final    INSIGNIFICANT GROWTH Performed at [facility]    Report Status 12/01/2014 FINAL  Final  Culture, blood (routine x 2)     Status: None   Collection Time: 11/30/14  7:12 PM  Result Value Ref Range Status   Specimen Description BLOOD RIGHT ARM  Final   Special Requests   Final    BOTTLES DRAWN AEROBIC AND ANAEROBIC 10CC BLUE 5CC RED   Culture   Final    ENTEROCOCCUS SPECIES Note: REPORT FAXED BY REQUEST Note: Gram Stain Report Called to,Read Back By and Verified With: CECILIA POWELL 12/04/14 1425 BY SMITHERSJ Performed at [facility]    Report Status 12/09/2014 FINAL  Final   Organism ID, Bacteria ENTEROCOCCUS SPECIES  Final      Susceptibility   Enterococcus species - MIC*    AMPICILLIN RESISTANT      VANCOMYCIN <=0.5 SENSITIVE Sensitive     * ENTEROCOCCUS SPECIES  Culture, blood (routine  x 2)     Status: None   Collection Time: 11/30/14  7:15 PM  Result Value Ref Range Status   Specimen Description BLOOD LEFT  WRIST  Final   Special Requests BOTTLES DRAWN AEROBIC AND ANAEROBIC 10CC EACH  Final   Culture   Final    ENTEROCOCCUS SPECIES Note: SUSCEPTIBILITIES PERFORMED ON PREVIOUS CULTURE WITHIN THE LAST 5 DAYS. Performed at [facility]    Report Status 12/09/2014 FINAL  Final  Blood culture (routine x 2)     Status: None   Collection Time: 12/06/14 10:00 PM  Result Value Ref Range Status   Specimen Description BLOOD RIGHT ARM  Final   Special Requests BOTTLES DRAWN AEROBIC AND ANAEROBIC 10CC EACH  Final   Culture  Setup Time   Final    GRAM POSITIVE COCCI IN PAIRS AND CHAINS Gram Stain Report Called to,Read Back By and Verified With: JAMES,T. AT 7619 ON 12/07/2014 BY BAUGHAM,M Performed at [facility]    Culture   Final    ENTEROCOCCUS SPECIES Performed at [facility]    Report Status 12/09/2014 FINAL  Final   Organism ID, Bacteria ENTEROCOCCUS SPECIES  Final      Susceptibility   Enterococcus species - MIC*    AMPICILLIN <=2 SENSITIVE Sensitive     VANCOMYCIN 1 SENSITIVE Sensitive     GENTAMICIN SYNERGY SENSITIVE Sensitive     LINEZOLID 2 SENSITIVE Sensitive     * ENTEROCOCCUS SPECIES  Blood culture (routine x 2)     Status: None   Collection Time: 12/06/14 10:06 PM  Result Value Ref Range Status   Specimen Description BLOOD RIGHT HAND  Final   Special Requests BOTTLES DRAWN AEROBIC AND ANAEROBIC 10CC EACH  Final   Culture  Setup Time   Final    GRAM POSITIVE COCCI IN PAIRS AND CHAINS Gram Stain Report Called to,Read Back By and Verified With: JAMES,T. AT 5093 ON 12/07/2014 BY BAUGHAM,M. Performed at [facility]    Culture   Final    ENTEROCOCCUS SPECIES SUSCEPTIBILITIES PERFORMED ON PREVIOUS CULTURE WITHIN THE LAST 5 DAYS. Performed at [facility]    Report Status 12/09/2014 FINAL  Final  Culture, Urine     Status: None   Collection Time: 12/06/14 10:16 PM  Result Value Ref Range Status   Specimen Description URINE, CATHETERIZED   Final   Special Requests NONE  Final   Culture   Final    NO GROWTH 2 DAYS Performed at [facility]    Report Status 12/09/2014 FINAL  Final  MRSA PCR Screening     Status: None   Collection Time: 12/07/14 12:25 AM  Result Value Ref Range Status   MRSA by PCR NEGATIVE NEGATIVE Final    Comment:        The GeneXpert MRSA Assay (FDA approved for NASAL specimens only), is one component of a comprehensive MRSA colonization surveillance program. It is not intended to diagnose MRSA infection nor to guide or monitor treatment for MRSA infections.   Culture, blood (routine x 2)     Status: None (Preliminary result)   Collection Time: 12/08/14 12:57 PM  Result Value Ref Range Status   Specimen Description BLOOD LEFT HAND  Final   Special Requests   Final    Immunocompromised BOTTLES DRAWN AEROBIC AND ANAEROBIC 6 CC EACH BOTTLE   Culture PENDING  Incomplete   Report Status PENDING  Incomplete  Culture, blood (routine x 2)  Status: None (Preliminary result)   Collection Time: 12/08/14  1:00 PM  Result Value Ref Range Status   Specimen Description BLOOD RIGHT HAND  Final   Special Requests   Final    Immunocompromised BOTTLES DRAWN AEROBIC AND ANAEROBIC 6 CC EACH BOTTLE   Culture PENDING  Incomplete   Report Status PENDING  Incomplete     Impression/Recommendation  Principal Problem:   Bacteremia Active Problems:   HTN (hypertension)   History of pulmonary embolism   Type 2 diabetes, uncontrolled, with renal manifestation   CKD (chronic kidney disease), stage IV   Chronic combined systolic and diastolic CHF (congestive heart failure)   Chronic anticoagulation   Atrial fibrillation   Implantable cardioverter-defibrillator-CRT- Mdt   COPD (chronic obstructive pulmonary disease)   Anemia of chronic disease   Fever   SOB (shortness of breath)   [name] is a 69 y.o. female with  Enterococcal AICD infection, DM with CKD stage IV, ischemic CHF with ef  15%  #1 AICD infection with Enterococcus:  --ICD has to come out to effect cure --we need clarity on the disparity between cultures done on the 9th and those done during this admission. When I called the lab yesterday they had stated that the "[employer]" machine was unable to do S and they were planning on KB method but I need to have them recheck this as the results are not concordant at all.  It is VERY important that we know whether this organism is AMP S or not because I would much prefer to use AMP + Gent or possibly AMP + Rocephin high dose (to avoid nephrotoxicity) rather than VANCO + GENT  I am endeavoring to contact micro lab re these disparities  I will check CT abdomen with oral contrast to make sure we are not missing and intrabdominal abscess that  Might be source of this bacteremia.  Would be helpful to have the old records re what she grew before in 2007  I spent greater than 60 minutes with the patient including greater than 50% of time in face to face counsel of the patient re her PM infection  and in coordination of their care.     12/09/2014, 4:55 PM   Thank you so much for this interesting consult  [place] for [facility] [phone] (pager) [phone] (office) 12/09/2014, 4:55 PM  [facility] 12/09/2014, 4:55 PM    ADDENDUM: INDEED AS I HAD MENTIONED THE CULTURES DONE INITIALLY DID NOT GROW WELL AND WERE REJECTED BY [employer] MACHINE WHEN RUN AT [facility]  THEY DID EVENTUALLY GET THE VITEK AND A KIRBY BAUER PLATE TO WORK REPORTEDLY AND THAT THEN WAS READ AS SHOWING AMP R   THE SUBSEQUENT BLOOD CULTURES HAVE BEED DONE ON A MORE EASILY GROWING ENTEROCOCCUS THAT GREW AT [facility] (THE MICRO LAB MOVED IN THE TIME BETWEEN CULTURES ) AND SHOWED A SUSC ORGANISM  I WILL THEREFORE START PT ON [place] ALONG WITH CONTINUE VANCOMYCIN WHILE WE SEEK GREATER CLARITY ON DATA FROM [facility] AND ASK OUR MICRO TO LOOK AT THEIR DATA AS WELL

## 2014-12-09 NOTE — Progress Notes (Signed)
ANTIBIOTIC CONSULT NOTE - INITIAL  Pharmacy Consult for Unasyn Indication: Entercoccus ICD infection  No Known Allergies  Patient Measurements: Height: 5\' 4"  (162.6 cm) Weight: 148 lb 14.4 oz (67.541 kg) (scale a) IBW/kg (Calculated) : 54.7  Vital Signs: Temp: 98.7 F (37.1 C) (06/18 1416) Temp Source: Oral (06/18 1416) BP: 127/59 mmHg (06/18 1416) Pulse Rate: 71 (06/18 1416) Intake/Output from previous day: 06/17 0701 - 06/18 0700 In: 1440 [P.O.:840; I.V.:500; IV Piggyback:100] Out: 202 [Urine:200; Stool:2] Intake/Output from this shift: Total I/O In: 660 [P.O.:660] Out: -   Labs:  Recent Labs  12/06/14 2130 12/07/14 0628 12/08/14 0538  WBC 9.6 11.1* 6.4  HGB 10.1* 8.4* 9.0*  PLT 179 143* 136*  CREATININE 2.11* 2.18* 1.78*   Estimated Creatinine Clearance: 27.4 mL/min (by C-G formula based on Cr of 1.78). No results for input(s): VANCOTROUGH, VANCOPEAK, VANCORANDOM, GENTTROUGH, GENTPEAK, GENTRANDOM, TOBRATROUGH, TOBRAPEAK, TOBRARND, AMIKACINPEAK, AMIKACINTROU, AMIKACIN in the last 72 hours.   Microbiology: Recent Results (from the past 720 hour(s))  Rapid strep screen   (If patient has fever and/or without cough or runny nose)     Status: None   Collection Time: 11/30/14  8:05 AM  Result Value Ref Range Status   Streptococcus, Group A Screen (Direct) NEGATIVE NEGATIVE Final    Comment: (NOTE) A Rapid Antigen test may result negative if the antigen level in the sample is below the detection level of this test. The FDA has not cleared this test as a stand-alone test therefore the rapid antigen negative result has reflexed to a Group A Strep culture.   Culture, Group A Strep     Status: None   Collection Time: 11/30/14  8:05 AM  Result Value Ref Range Status   Strep A Culture Negative  Final    Comment: (NOTE) Performed At: [facility] [place], [place] [id number] Lindon Romp MD RN:[id number]   MRSA PCR Screening     Status:  Abnormal   Collection Time: 11/30/14  1:19 PM  Result Value Ref Range Status   MRSA by PCR POSITIVE (A) NEGATIVE Final    Comment:        The GeneXpert MRSA Assay (FDA approved for NASAL specimens only), is one component of a comprehensive MRSA colonization surveillance program. It is not intended to diagnose MRSA infection nor to guide or monitor treatment for MRSA infections. RESULT CALLED TO, READ BACK BY AND VERIFIED WITH: BOWIE,C RN @ [phone] 11/30/14 LEONARDA,   Respiratory virus panel     Status: Abnormal   Collection Time: 11/30/14  1:31 PM  Result Value Ref Range Status   Respiratory Syncytial Virus A Negative Negative Final   Respiratory Syncytial Virus B Negative Negative Final   Influenza A Negative Negative Final   Influenza B Negative Negative Final   Parainfluenza 1 Negative Negative Final   Parainfluenza 2 Negative Negative Final   Parainfluenza 3 Negative Negative Final   Metapneumovirus Negative Negative Final   Rhinovirus Positive (A) Negative Final   Adenovirus Negative Negative Final    Comment: (NOTE) Performed At: [facility] [address] [place], [place] [id number] Lindon Romp MD OK:[id number]   Urine culture     Status: None   Collection Time: 11/30/14  1:33 PM  Result Value Ref Range Status   Specimen Description URINE, RANDOM  Final   Special Requests NONE  Final   Colony Count   Final    9,000 COLONIES/ML Performed at [facility]  Culture   Final    INSIGNIFICANT GROWTH Performed at [facility]    Report Status 12/01/2014 FINAL  Final  Culture, blood (routine x 2)     Status: None   Collection Time: 11/30/14  7:12 PM  Result Value Ref Range Status   Specimen Description BLOOD RIGHT ARM  Final   Special Requests   Final    BOTTLES DRAWN AEROBIC AND ANAEROBIC 10CC BLUE 5CC RED   Culture   Final    ENTEROCOCCUS SPECIES Note: REPORT FAXED BY REQUEST Note: Gram Stain Report Called to,Read Back By and  Verified With: CECILIA POWELL 12/04/14 1425 BY SMITHERSJ    Report Status 12/09/2014 FINAL  Final   Organism ID, Bacteria ENTEROCOCCUS SPECIES  Final      Susceptibility   Enterococcus species - MIC*    AMPICILLIN RESISTANT      VANCOMYCIN Value in next row Sensitive      <=0.5 SENS[facility]IVEPerformed at [facility]  Culture, blood (routine x 2)     Status: None   Collection Time: 11/30/14  7:15 PM  Result Value Ref Range Status   Specimen Description BLOOD LEFT WRIST  Final   Special Requests BOTTLES DRAWN AEROBIC AND ANAEROBIC 10CC EACH  Final   Culture   Final    ENTEROCOCCUS SPECIES Note: SUSCEPTIBILITIES PERFORMED ON PREVIOUS CULTURE WITHIN THE LAST 5 DAYS. Performed at [facility]    Report Status 12/09/2014 FINAL  Final  Blood culture (routine x 2)     Status: None   Collection Time: 12/06/14 10:00 PM  Result Value Ref Range Status   Specimen Description BLOOD RIGHT ARM  Final   Special Requests BOTTLES DRAWN AEROBIC AND ANAEROBIC 10CC EACH  Final   Culture  Setup Time   Final    GRAM POSITIVE COCCI IN PAIRS AND CHAINS Gram Stain Report Called to,Read Back By and Verified With: JAMES,T. AT 1610 ON 12/07/2014 BY BAUGHAM,M Performed at [facility]    Culture   Final    ENTEROCOCCUS SPECIES Performed at [facility]    Report Status 12/09/2014 FINAL  Final   Organism ID, Bacteria ENTEROCOCCUS SPECIES  Final      Susceptibility   Enterococcus species - MIC*    AMPICILLIN <=2 SENSITIVE Sensitive     VANCOMYCIN 1 SENSITIVE Sensitive     GENTAMICIN SYNERGY SENSITIVE Sensitive     LINEZOLID 2 SENSITIVE Sensitive     * ENTEROCOCCUS SPECIES  Blood culture (routine x 2)     Status: None   Collection Time: 12/06/14 10:06 PM  Result Value Ref Range Status   Specimen Description BLOOD RIGHT HAND  Final   Special Requests BOTTLES DRAWN AEROBIC AND ANAEROBIC 10CC EACH  Final   Culture  Setup Time   Final    GRAM POSITIVE  COCCI IN PAIRS AND CHAINS Gram Stain Report Called to,Read Back By and Verified With: JAMES,T. AT 9604 ON 12/07/2014 BY BAUGHAM,M. Performed at [facility]    Culture   Final    ENTEROCOCCUS SPECIES SUSCEPTIBILITIES PERFORMED ON PREVIOUS CULTURE WITHIN THE LAST 5 DAYS. Performed at [facility]    Report Status 12/09/2014 FINAL  Final  Culture, Urine     Status: None   Collection Time: 12/06/14 10:16 PM  Result Value Ref Range Status   Specimen Description URINE, CATHETERIZED  Final   Special Requests NONE  Final   Culture   Final  NO GROWTH 2 DAYS Performed at [facility]    Report Status 12/09/2014 FINAL  Final  MRSA PCR Screening     Status: None   Collection Time: 12/07/14 12:25 AM  Result Value Ref Range Status   MRSA by PCR NEGATIVE NEGATIVE Final    Comment:        The GeneXpert MRSA Assay (FDA approved for NASAL specimens only), is one component of a comprehensive MRSA colonization surveillance program. It is not intended to diagnose MRSA infection nor to guide or monitor treatment for MRSA infections.   Culture, blood (routine x 2)     Status: None (Preliminary result)   Collection Time: 12/08/14 12:57 PM  Result Value Ref Range Status   Specimen Description BLOOD LEFT HAND  Final   Special Requests   Final    Immunocompromised BOTTLES DRAWN AEROBIC AND ANAEROBIC 6 CC EACH BOTTLE   Culture PENDING  Incomplete   Report Status PENDING  Incomplete  Culture, blood (routine x 2)     Status: None (Preliminary result)   Collection Time: 12/08/14  1:00 PM  Result Value Ref Range Status   Specimen Description BLOOD RIGHT HAND  Final   Special Requests   Final    Immunocompromised BOTTLES DRAWN AEROBIC AND ANAEROBIC 6 CC EACH BOTTLE   Culture PENDING  Incomplete   Report Status PENDING  Incomplete    Medical History: Past Medical History  Diagnosis Date  . Cardiomyopathy, nonischemic     a. 1999 nl cath;  b. 12/05 [facility];  c.  10/2005 ICD extraction 2/2 enterococcus bacteremia and Veg on RV lead;  c. 05/2008 low risk Myoview (scarring w/ some evidence of inf ischemia);  d. 11/2012 Echo: EF 15-20%;  e. 01/2013 s/p MDT [place] [facility] CRT D, ser # [id number];  f. 05/2013 Echo: EF 15%.  . Enterococcal infection     a. 10/2005 - AICD-explanted  . Pulmonary embolism     a. 07/2005 after total right hip arthroplasty  . Hilar density     a. infrahilar mass/adenopathy on CT scan 5/07; subsequently  resolved  . LBBB (left bundle branch block)   . GERD (gastroesophageal reflux disease)   . Hyperlipidemia   . Hypertension   . Tobacco abuse     a. discontinued in 1997, and then resumed  . Urinary incontinence   . Anemia     a. mild/chronic  . Villous adenoma of colon     a. tubovillous adenomatous polyp with focal high grade dysplasia; presented with hematochezia - followed by Dr. Laural Golden.  . Implantable cardioverter-defibrillator-CRT- Mdt     a.  01/2013 s/p MDT [place] [facility] CRT D, ser # [id number]  . Chronic systolic CHF (congestive heart failure)     a. 11/2012 Echo: EF 15-20%;  b. 05/2013 TEE EF 15%.  . Pneumonia 07/2005  . Obstructive sleep apnea     a. mild-did not tolerate CPAP (01/24/2013)  . History of blood transfusion   . Degenerative joint disease     of knees, shoulder, and hips  . PAF (paroxysmal atrial fibrillation)     a. 07/2012 s/p TEE/DCCV;  b. chronic coumadin;  c. 05/2013 Recurrent Afib->TEE/DCCV and amio initiation.  . CKD (chronic kidney disease), stage III     creatinin-1.44 in 1/09; 1.51 in 1/10  . Type II diabetes mellitus     type 2    Assessment: 31 YOF with hx prior ICD vegetation with Enteroccocus in 2007 treated removal of ICD and  IV antibiotics. A new ICD was placed in 2014 in the setting of worsening heart failure and now blood cultures are showing enterococcus again. Imaging show a vegetation on the ICD.   Blood cultures from 6/9 are showing amp resistant however cultures from 6/15 are showing amp  sensitive. ID is working with lab to determine whether the species is truly amp resistant or sensitive. In the meantime, ID has consulted pharmacy to transition from ampicillin to ampicillin/sulbactam in the case that perhaps the strand is a Conservation officer, historic buildings. Will renally adjust Unasyn for the patient's renal function, SCr 1.78, CrCl~25-30 ml/min.   Goal of Therapy:  Proper antibiotics for infection/cultures adjusted for renal/hepatic function   Plan:  1. Unasyn 3g IV every 12 hours 2. Will continue to follow renal function, culture results, LOT, and antibiotic de-escalation plans   Alycia Rossetti, PharmD, BCPS Clinical Pharmacist Pager: [phone] 12/09/2014 6:29 PM

## 2014-12-10 LAB — GLUCOSE, CAPILLARY
GLUCOSE-CAPILLARY: 107 mg/dL — AB (ref 65–99)
Glucose-Capillary: 91 mg/dL (ref 65–99)
Glucose-Capillary: 194 mg/dL — ABNORMAL HIGH (ref 65–99)
Glucose-Capillary: 112 mg/dL — ABNORMAL HIGH (ref 65–99)

## 2014-12-10 LAB — CLOSTRIDIUM DIFFICILE BY PCR: Toxigenic C. Difficile by PCR: NEGATIVE

## 2014-12-10 LAB — DIGOXIN LEVEL: Digoxin Level: 0.5 ng/mL — ABNORMAL LOW (ref 0.8–2.0)

## 2014-12-10 LAB — SEDIMENTATION RATE: Sed Rate: 58 mm/h — ABNORMAL HIGH (ref 0–22)

## 2014-12-10 LAB — PREALBUMIN: Prealbumin: 16.7 mg/dL — ABNORMAL LOW (ref 18–38)

## 2014-12-10 LAB — PROTIME-INR
INR: 1.25 (ref 0.00–1.49)
Prothrombin Time: 15.9 seconds — ABNORMAL HIGH (ref 11.6–15.2)

## 2014-12-10 LAB — C-REACTIVE PROTEIN: CRP: 6 mg/dL — ABNORMAL HIGH (ref <1.0)

## 2014-12-10 MED ORDER — WARFARIN SODIUM 3 MG PO TABS
3.0000 mg | ORAL_TABLET | Freq: Once | ORAL | Status: DC
  Filled 2014-12-10: qty 1

## 2014-12-10 MED ORDER — ENOXAPARIN SODIUM 30 MG/0.3ML ~~LOC~~ SOLN
30.0000 mg | SUBCUTANEOUS | Status: DC
  Administered 2014-12-10 – 2014-12-15 (×6): 30 mg via SUBCUTANEOUS
  Filled 2014-12-10 (×6): qty 0.3

## 2014-12-10 MED ORDER — WARFARIN SODIUM 2.5 MG PO TABS
2.5000 mg | ORAL_TABLET | Freq: Once | ORAL | Status: DC
  Filled 2014-12-10: qty 1

## 2014-12-10 NOTE — Progress Notes (Addendum)
Patient ID: [name], female   DOB: 09/16/1945, 69 y.o.   MRN: [id number]    SUBJECTIVE:  she denies chest pain or sob. No fever or chills.  Marland Kitchen amiodarone  200 mg Oral Daily  . ampicillin-sulbactam (UNASYN) IV  3 g Intravenous Q12H  . antiseptic oral rinse  7 mL Mouth Rinse BID  . carvedilol  9.375 mg Oral BID WC  . colchicine  0.6 mg Oral BID  . digoxin  0.0625 mg Oral QODAY  . feeding supplement (ENSURE ENLIVE)  237 mL Oral BID BM  . furosemide  40 mg Oral Daily  . glimepiride  2 mg Oral Daily  . insulin aspart  0-15 Units Subcutaneous TID WC  . insulin aspart  0-5 Units Subcutaneous QHS  . isosorbide mononitrate  30 mg Oral Daily  . levothyroxine  25 mcg Oral QAC breakfast  . metoCLOPramide  5 mg Oral QID  . pravastatin  80 mg Oral QHS  . sodium chloride  3 mL Intravenous Q12H  . vancomycin  750 mg Intravenous Q24H  . warfarin  2.5 mg Oral ONCE-1800  . Warfarin - Pharmacist Dosing Inpatient   Does not apply Q24H      OBJECTIVE: Physical Exam: Filed Vitals:   12/09/14 0610 12/09/14 1416 12/09/14 2053 12/10/14 0605  BP: 138/66 127/59 139/67 143/69  Pulse: 70 71 70 74  Temp: 98.2 F (36.8 C) 98.7 F (37.1 C) 98.9 F (37.2 C) 98.6 F (37 C)  TempSrc: Oral Oral Oral Oral  Resp: 18 18 18 17   Height:      Weight: 148 lb 14.4 oz (67.541 kg)   149 lb 7.6 oz (67.8 kg)  SpO2: 98% 99% 99% 99%    Intake/Output Summary (Last 24 hours) at 12/10/14 0906 Last data filed at 12/10/14 0815  Gross per 24 hour  Intake    740 ml  Output      0 ml  Net    740 ml    Telemetry reveals sinus rhythm  GEN- The patient is well appearing, alert and oriented x 3 today.   Head- normocephalic, atraumatic Eyes-  Sclera clear, conjunctiva pink Ears- hearing intact Oropharynx- clear Neck- supple, no JVP Lymph- no cervical lymphadenopathy Lungs- Clear to ausculation bilaterally, normal work of breathing Heart- Regular rate and rhythm, no murmurs, rubs or gallops, PMI not laterally  displaced GI- soft, NT, ND, + BS Extremities- no clubbing, cyanosis, or edema Skin- no rash or lesion Psych- euthymic mood, full affect Neuro- strength and sensation are intact  LABS: Basic Metabolic Panel:  Recent Labs  12/08/14 0538  NA 139  K 4.2  CL 104  CO2 27  GLUCOSE 116*  BUN 43*  CREATININE 1.78*  CALCIUM 8.8*   Liver Function Tests: No results for input(s): AST, ALT, ALKPHOS, BILITOT, PROT, ALBUMIN in the last 72 hours. No results for input(s): LIPASE, AMYLASE in the last 72 hours. CBC:  Recent Labs  12/08/14 0538  WBC 6.4  HGB 9.0*  HCT 27.4*  MCV 95.8  PLT 136*   Cardiac Enzymes: No results for input(s): CKTOTAL, CKMB, CKMBINDEX, TROPONINI in the last 72 hours. BNP: Invalid input(s): POCBNP D-Dimer: No results for input(s): DDIMER in the last 72 hours. Hemoglobin A1C: No results for input(s): HGBA1C in the last 72 hours. Fasting Lipid Panel: No results for input(s): CHOL, HDL, LDLCALC, TRIG, CHOLHDL, LDLDIRECT in the last 72 hours. Thyroid Function Tests: No results for input(s): TSH, T4TOTAL, T3FREE, THYROIDAB in the last 72  hours.  Invalid input(s): FREET3 Anemia Panel: No results for input(s): VITAMINB12, FOLATE, FERRITIN, TIBC, IRON, RETICCTPCT in the last 72 hours.  RADIOLOGY: Ct Abdomen Pelvis Wo Contrast  12/09/2014   CLINICAL DATA:  Fever of unknown origin. History of chronic renal disease.  EXAM: CT ABDOMEN AND PELVIS WITHOUT CONTRAST  TECHNIQUE: Multidetector CT imaging of the abdomen and pelvis was performed following the standard protocol without IV contrast.  COMPARISON:  07/06/2013.  FINDINGS: Significant decrease in pericardial fluid with minimal fluid remaining. This measures 7 mm in maximum thickness.  Calcified granuloma in the spleen. Interval mild bilateral perinephric soft tissue stranding and fluid. Interval mid inhomogeneity of the lower pole of the right kidney. Prominent extrarenal pelves are unchanged. No hydronephrosis or  urinary tract calculi seen.  Right hip prosthesis with associated streak artifacts. Calcified granulomata in the spleen. Interval minimal perihepatic free peritoneal fluid. There is also small amount of free peritoneal fluid in the pelvis. No significant change in a low density right adrenal mass, measuring 1.5 x 1.3 cm on image number 19 and -4 Hounsfield units in density.  A small cyst in the dome of the liver on the right is unchanged. Poorly distended gallbladder. Unremarkable non contrasted appearance of the pancreas and left adrenal gland.  Multiple colonic diverticula without evidence of diverticulitis. No enlarged lymph nodes. No evidence of appendicitis. Cardiac pacer and AICD leads. Stable scarring at both lung bases. Severe left hip degenerative changes with bony remodeling. Lumbar and lower thoracic spine degenerative changes and mild scoliosis. Atheromatous arterial calcifications.  IMPRESSION: 1. Interval bilateral perinephric soft tissue stranding and edema and inhomogeneity of the lower pole of the right kidney. These findings are suspicious for acute pyelonephritis. 2. Interval small amount of free peritoneal fluid. 3. Colonic diverticulosis without evidence of diverticulitis. 4. Stable right adrenal adenoma. 5. Significantly smaller pericardial effusion.   Electronically Signed   By: Claudie Revering M.D.   On: 12/09/2014 19:18   Dg Chest 2 View  12/06/2014   CLINICAL DATA:  Acute onset of shortness of breath. Initial encounter.  EXAM: CHEST  2 VIEW  COMPARISON:  Chest radiograph performed 11/30/2014  FINDINGS: The lungs are well-aerated. Trace fluid is suggested along the right minor fissure. Minimal bibasilar atelectasis is noted. There is no evidence of pleural effusion or pneumothorax.  The heart is borderline enlarged. A pacemaker/AICD is noted at the right chest wall, with leads ending at the right atrium, right ventricle and coronary sinus. No acute osseous abnormalities are seen.   IMPRESSION: Trace fluid again suggested along the right minor fissure. Borderline cardiomegaly. Minimal bibasilar atelectasis noted.   Electronically Signed   By: Garald Balding M.D.   On: 12/06/2014 23:03   Dg Chest 2 View  11/30/2014   CLINICAL DATA:  Shortness of breath.  EXAM: CHEST  2 VIEW  COMPARISON:  10/17/2014.  01/10/2014.  FINDINGS: Mediastinum hilar structures are normal. Cardiac pacer with lead tips in right atrium right ventricle. Cardiomegaly. Mild bilateral interstitial prominence with Kerley B-lines noted. These findings are most consistent mild congestive heart failure. Tiny left pleural effusion cannot be excluded. No pneumothorax. Degenerative changes both shoulders.  IMPRESSION: Findings consistent with mild congestive heart failure with pulmonary interstitial edema and tiny left pleural effusion. Cardiac pacer noted in stable position.   Electronically Signed   By: Marcello Moores  Register   On: 11/30/2014 08:42   US Renal  11/30/2014   CLINICAL DATA:  Chronic kidney disease stage 4  EXAM: RENAL /  URINARY TRACT ULTRASOUND COMPLETE  COMPARISON:  01/17/2014  FINDINGS: Right Kidney:  Length: 9.6 cm. Extrarenal pelvis on the right similar to prior study. 12 mm cyst midpole. Increased renal echogenicity.  Left Kidney:  Length: 11.1 cm. 12 mm upper pole cyst. Increased echogenicity of the renal cortex.  Bladder:  Appears normal for degree of bladder distention.  IMPRESSION: Medical renal disease.   Electronically Signed   By: Skipper Cliche M.D.   On: 11/30/2014 17:22    ASSESSMENT AND PLAN:  Principal Problem:   Bacteremia Active Problems:   HTN (hypertension)   History of pulmonary embolism   Type 2 diabetes, uncontrolled, with renal manifestation   CKD (chronic kidney disease), stage IV   Chronic combined systolic and diastolic CHF (congestive heart failure)   Chronic anticoagulation   Atrial fibrillation   Implantable cardioverter-defibrillator-CRT- Mdt   COPD (chronic obstructive  pulmonary disease)   Anemia of chronic disease   Fever   SOB (shortness of breath)   ICD (implantable cardioverter-defibrillator) infection   Enterococcal bacteremia   Diabetic feet  Plan - ICD system extraction on Wednesday. Type and duration of her anti-biotic therapy as per ID service. Warfarin stopped as she will need to undergo extraction. I have started lovenox. Cristopher Peru, MD 12/10/2014 9:06 AM

## 2014-12-10 NOTE — Progress Notes (Signed)
ANTICOAGULATION CONSULT NOTE - follow up  Pharmacy Consult for Coumadin (chronic Rx PTA) Indication: H/O PE  No Known Allergies  Patient Measurements: Height: 5\' 4"  (162.6 cm) Weight: 149 lb 7.6 oz (67.8 kg) IBW/kg (Calculated) : 54.7  Vital Signs: Temp: 98.6 F (37 C) (06/19 0605) Temp Source: Oral (06/19 0605) BP: 143/69 mmHg (06/19 0605) Pulse Rate: 74 (06/19 0605)  Labs:  Recent Labs  12/08/14 0538 12/09/14 1335 12/10/14 0514  HGB 9.0*  --   --   HCT 27.4*  --   --   PLT 136*  --   --   LABPROT 17.3* 17.3* 15.9*  INR 1.40 1.40 1.25  CREATININE 1.78*  --   --    Estimated Creatinine Clearance: 27.4 mL/min (by C-G formula based on Cr of 1.78).  Medical History: Past Medical History  Diagnosis Date  . Cardiomyopathy, nonischemic     a. 1999 nl cath;  b. 12/05 [facility] [place];  c. 10/2005 ICD extraction 2/2 enterococcus bacteremia and Veg on RV lead;  c. 05/2008 low risk Myoview (scarring w/ some evidence of inf ischemia);  d. 11/2012 Echo: EF 15-20%;  e. 01/2013 s/p MDT [facility] CRT D, ser # [id number];  f. 05/2013 Echo: EF 15%.  . Enterococcal infection     a. 10/2005 - AICD-explanted  . Pulmonary embolism     a. 07/2005 after total right hip arthroplasty  . Hilar density     a. infrahilar mass/adenopathy on CT scan 5/07; subsequently  resolved  . LBBB (left bundle branch block)   . GERD (gastroesophageal reflux disease)   . Hyperlipidemia   . Hypertension   . Tobacco abuse     a. discontinued in 1997, and then resumed  . Urinary incontinence   . Anemia     a. mild/chronic  . Villous adenoma of colon     a. tubovillous adenomatous polyp with focal high grade dysplasia; presented with hematochezia - followed by Dr. Laural Golden.  . Implantable cardioverter-defibrillator-CRT- Mdt     a.  01/2013 s/p MDT [facility] CRT D, ser # [id number]  . Chronic systolic CHF (congestive heart failure)     a. 11/2012 Echo: EF 15-20%;  b. 05/2013 TEE EF 15%.  . Pneumonia 07/2005  .  Obstructive sleep apnea     a. mild-did not tolerate CPAP (01/24/2013)  . History of blood transfusion   . Degenerative joint disease     of knees, shoulder, and hips  . PAF (paroxysmal atrial fibrillation)     a. 07/2012 s/p TEE/DCCV;  b. chronic coumadin;  c. 05/2013 Recurrent Afib->TEE/DCCV and amio initiation.  . CKD (chronic kidney disease), stage III     creatinin-1.44 in 1/09; 1.51 in 1/10  . Type II diabetes mellitus     type 2   Medications:  Prescriptions prior to admission  Medication Sig Dispense Refill Last Dose  . amiodarone (PACERONE) 200 MG tablet Take 1 tablet (200 mg total) by mouth daily. 90 tablet 3 12/08/2014 at 0900  . carvedilol (COREG) 3.125 MG tablet Take 3 tablets (9.375 mg total) by mouth 2 (two) times daily with a meal. 180 tablet 2 12/08/2014 at 0900  . cetirizine (ZYRTEC) 10 MG tablet Take 10 mg by mouth daily as needed for allergies.    Past Week at Unknown time  . colchicine 0.6 MG tablet Take 0.6 mg by mouth 2 (two) times daily.    12/08/2014 at 0900  . digoxin (LANOXIN) 0.125 MG tablet Take 0.5 tablets (  0.0625 mg total) by mouth every other day. 30 tablet 3 12/07/2014 at 0900  . feeding supplement, ENSURE ENLIVE, (ENSURE ENLIVE) LIQD Take 237 mLs by mouth 2 (two) times daily between meals. 237 mL 12 12/08/2014 at Unknown time  . ferrous sulfate 324 (65 FE) MG TBEC Take 1 tablet (325 mg total) by mouth 2 (two) times daily. 60 tablet  Past Week at Unknown time  . furosemide (LASIX) 40 MG tablet TAKE 1 AND 1/2 TABLETS BY MOUTH TWICE DAILY. 90 tablet 3 Past Week at Unknown time  . glimepiride (AMARYL) 2 MG tablet Take 2 mg by mouth daily.   12/08/2014 at 0900  . isosorbide mononitrate (IMDUR) 30 MG 24 hr tablet Take 1 tablet (30 mg total) by mouth daily. 30 tablet 0 12/08/2014 at 0900  . levothyroxine (SYNTHROID, LEVOTHROID) 25 MCG tablet Take 1 tablet (25 mcg total) by mouth daily before breakfast. 30 tablet 0 12/08/2014 at 0900  . metoCLOPramide (REGLAN) 5 MG tablet  Take 5 mg by mouth 4 (four) times daily.   12/08/2014 at 0900  . ondansetron (ZOFRAN) 4 MG tablet Take 4 mg by mouth every 4 (four) hours as needed for nausea or vomiting.   12/07/2014 at Unknown time  . oxyCODONE-acetaminophen (PERCOCET) 10-325 MG per tablet Take 1 tablet by mouth every 6 (six) hours as needed for pain.    12/05/2014 at Unknown time  . pravastatin (PRAVACHOL) 40 MG tablet Take 80 mg by mouth at bedtime.    12/07/2014 at Unknown time  . warfarin (COUMADIN) 2.5 MG tablet Take 0.5 tablets (1.25 mg total) by mouth daily at 6 PM. 10 tablet 0 12/07/2014 at 1415  . polyethylene glycol powder (GLYCOLAX/MIRALAX) powder Take 1 Container by mouth daily as needed for mild constipation.    unknown   Assessment: 69yo female on chronic Coumadin PTA for h/o PE.  INR was SUBtherapeutic on admission. Patient was scheduled to receive a dose of Coumadin on 6/17 but did not receive dose since it was automatically held during patient transfer from [facility] to [facility]. INR today remains sub-therapeutic at 1.25 today likely due to missed dose. Patient takes Coumadin 1.25 mg daily at home    Goal of Therapy:  INR 2-3 Monitor platelets by anticoagulation protocol: Yes   Plan:  Repeat Coumadin 2.5 mg today x 1 (dose increase to boost INR) INR daily.  Albertina Parr, PharmD., BCPS Clinical Pharmacist Pager [phone]

## 2014-12-10 NOTE — Consult Note (Signed)
WOC wound consult note Reason for Consult:Consulted for DFU but no DFU noted on bilateral foot assessment. Patient has diabetes and is at risk for neuropathic ulcers. Wound type: None.  Pressure Ulcer POA: No Measurement: No wound Wound bed:No wound Drainage (amount, consistency, odor) None Periwound: no wound Dressing procedure/placement/frequency:N/A.  No wound.  Patient is provided with a pressure redistribution chair cushion as she defers OOB despite urging due to discomfort in hips. She is not a surgical candidate for surgery. Patient is encouraged to cleanse feet daily and to inspect feet for injuries.  She is taught to wear protective footwear at all times. She indicates good understanding of these instrucitons. [place] nursing team will not follow, but will remain available to this patient, the nursing and medical team.  Please re-consult if needed. Thanks, Maudie Flakes, MSN, RN, [place], [place], [place] [phone])

## 2014-12-10 NOTE — Progress Notes (Signed)
[facility] for Infectious Disease    Subjective: No new complaints   Antibiotics:  Anti-infectives    Start     Dose/Rate Route Frequency Ordered Stop   12/10/14 0600  Ampicillin-Sulbactam (UNASYN) 3 g in sodium chloride 0.9 % 100 mL IVPB     3 g 100 mL/hr over 60 Minutes Intravenous Every 12 hours 12/09/14 1831     12/09/14 1845  Ampicillin-Sulbactam (UNASYN) 3 g in sodium chloride 0.9 % 100 mL IVPB     3 g 100 mL/hr over 60 Minutes Intravenous  Once 12/09/14 1831 12/09/14 2148   12/09/14 0600  levofloxacin (LEVAQUIN) IVPB 750 mg  Status:  Discontinued     750 mg 100 mL/hr over 90 Minutes Intravenous Every 48 hours 12/07/14 1124 12/08/14 1210   12/08/14 1400  ampicillin (OMNIPEN) 2 g in sodium chloride 0.9 % 50 mL IVPB  Status:  Discontinued     2 g 150 mL/hr over 20 Minutes Intravenous 3 times per day 12/08/14 1222 12/09/14 1037   12/08/14 1000  vancomycin (VANCOCIN) IVPB 750 mg/150 ml premix     750 mg 150 mL/hr over 60 Minutes Intravenous Every 24 hours 12/07/14 1115     12/07/14 1030  vancomycin (VANCOCIN) 1,500 mg in sodium chloride 0.9 % 500 mL IVPB     1,500 mg 250 mL/hr over 120 Minutes Intravenous  Once 12/07/14 0920 12/07/14 1359   12/07/14 0100  levofloxacin (LEVAQUIN) IVPB 750 mg     750 mg 100 mL/hr over 90 Minutes Intravenous  Once 12/07/14 0053 12/07/14 0301   12/06/14 2300  cefTRIAXone (ROCEPHIN) 1 g in dextrose 5 % 50 mL IVPB     1 g 100 mL/hr over 30 Minutes Intravenous  Once 12/06/14 2254 12/06/14 2338      Medications: Scheduled Meds: . amiodarone  200 mg Oral Daily  . ampicillin-sulbactam (UNASYN) IV  3 g Intravenous Q12H  . antiseptic oral rinse  7 mL Mouth Rinse BID  . carvedilol  9.375 mg Oral BID WC  . colchicine  0.6 mg Oral BID  . digoxin  0.0625 mg Oral QODAY  . enoxaparin (LOVENOX) injection  30 mg Subcutaneous Q24H  . feeding supplement (ENSURE ENLIVE)  237 mL Oral BID BM  . furosemide  40 mg Oral Daily  . glimepiride  2  mg Oral Daily  . insulin aspart  0-15 Units Subcutaneous TID WC  . insulin aspart  0-5 Units Subcutaneous QHS  . isosorbide mononitrate  30 mg Oral Daily  . levothyroxine  25 mcg Oral QAC breakfast  . metoCLOPramide  5 mg Oral QID  . pravastatin  80 mg Oral QHS  . sodium chloride  3 mL Intravenous Q12H  . vancomycin  750 mg Intravenous Q24H   Continuous Infusions:  PRN Meds:.sodium chloride, acetaminophen **OR** acetaminophen, ondansetron **OR** ondansetron (ZOFRAN) IV, ondansetron, oxyCODONE-acetaminophen, polyethylene glycol, sodium chloride    Objective: Weight change: 7.6 oz (0.214 kg)  Intake/Output Summary (Last 24 hours) at 12/10/14 1348 Last data filed at 12/10/14 0815  Gross per 24 hour  Intake    680 ml  Output      0 ml  Net    680 ml   Blood pressure 143/69, pulse 74, temperature 98.6 F (37 C), temperature source Oral, resp. rate 17, height 5\' 4"  (1.626 m), weight 149 lb 7.6 oz (67.8 kg), SpO2 99 %. Temp:  [98.6 F (37 C)-98.9 F (37.2 C)] 98.6 F (37 C) (  06/19 0605) Pulse Rate:  [70-74] 74 (06/19 0605) Resp:  [17-18] 17 (06/19 0605) BP: (127-143)/(59-69) 143/69 mmHg (06/19 0605) SpO2:  [99 %] 99 % (06/19 0605) Weight:  [149 lb 7.6 oz (67.8 kg)] 149 lb 7.6 oz (67.8 kg) (06/19 1610)  Physical Exam: General: Alert and awake, oriented x3, not in any acute distress. HEENT: anicteric sclera, pupils reactive to light and accommodation, EOMI, oropharynx clear and without exudate CVS regular rate, normal r, no murmur rubs or gallops Chest: dec breath sounds at bases Abdomen: soft nontender, nondistended, normal bowel sounds, Extremities: 2+ edema Skin:   No diabetic foot ulcers  Neuro: nonfocal, strength and sensation intact  CBC: CBC Latest Ref Rng 12/08/2014 12/07/2014 12/06/2014  WBC 4.0 - 10.5 K/uL 6.4 11.1(H) 9.6  Hemoglobin 12.0 - 15.0 g/dL 9.0(L) 8.4(L) 10.1(L)  Hematocrit 36.0 - 46.0 % 27.4(L) 25.4(L) 30.7(L)  Platelets 150 - 400 K/uL 136(L) 143(L)  179       BMET  Recent Labs  12/08/14 0538  NA 139  K 4.2  CL 104  CO2 27  GLUCOSE 116*  BUN 43*  CREATININE 1.78*  CALCIUM 8.8*     Liver Panel  No results for input(s): PROT, ALBUMIN, AST, ALT, ALKPHOS, BILITOT, BILIDIR, IBILI in the last 72 hours.     Sedimentation Rate  Recent Labs  12/10/14 0514  ESRSEDRATE 58*   C-Reactive Protein  Recent Labs  12/10/14 0514  CRP 6.0*    Micro Results: Recent Results (from the past 720 hour(s))  Rapid strep screen   (If patient has fever and/or without cough or runny nose)     Status: None   Collection Time: 11/30/14  8:05 AM  Result Value Ref Range Status   Streptococcus, Group A Screen (Direct) NEGATIVE NEGATIVE Final    Comment: (NOTE) A Rapid Antigen test may result negative if the antigen level in the sample is below the detection level of this test. The FDA has not cleared this test as a stand-alone test therefore the rapid antigen negative result has reflexed to a Group A Strep culture.   Culture, Group A Strep     Status: None   Collection Time: 11/30/14  8:05 AM  Result Value Ref Range Status   Strep A Culture Negative  Final    Comment: (NOTE) Performed At: [facility] [place], [place] [id number] Lindon Romp MD JX:[id number]   MRSA PCR Screening     Status: Abnormal   Collection Time: 11/30/14  1:19 PM  Result Value Ref Range Status   MRSA by PCR POSITIVE (A) NEGATIVE Final    Comment:        The GeneXpert MRSA Assay (FDA approved for NASAL specimens only), is one component of a comprehensive MRSA colonization surveillance program. It is not intended to diagnose MRSA infection nor to guide or monitor treatment for MRSA infections. RESULT CALLED TO, READ BACK BY AND VERIFIED WITH: BOWIE,C RN @ 1308 11/30/14 LEONARDA,   Respiratory virus panel     Status: Abnormal   Collection Time: 11/30/14  1:31 PM  Result Value Ref Range Status   Respiratory Syncytial  Virus A Negative Negative Final   Respiratory Syncytial Virus B Negative Negative Final   Influenza A Negative Negative Final   Influenza B Negative Negative Final   Parainfluenza 1 Negative Negative Final   Parainfluenza 2 Negative Negative Final   Parainfluenza 3 Negative Negative Final   Metapneumovirus Negative Negative Final   Rhinovirus Positive (  A) Negative Final   Adenovirus Negative Negative Final    Comment: (NOTE) Performed At: [facility] [place], [place] [id number] Lindon Romp MD [id number]   Urine culture     Status: None   Collection Time: 11/30/14  1:33 PM  Result Value Ref Range Status   Specimen Description URINE, RANDOM  Final   Special Requests NONE  Final   Colony Count   Final    9,000 COLONIES/ML Performed at [facility]    Culture   Final    INSIGNIFICANT GROWTH Performed at [facility]    Report Status 12/01/2014 FINAL  Final  Culture, blood (routine x 2)     Status: None   Collection Time: 11/30/14  7:12 PM  Result Value Ref Range Status   Specimen Description BLOOD RIGHT ARM  Final   Special Requests   Final    BOTTLES DRAWN AEROBIC AND ANAEROBIC 10CC BLUE 5CC RED   Culture   Final    ENTEROCOCCUS SPECIES Note: REPORT FAXED BY REQUEST Note: Gram Stain Report Called to,Read Back By and Verified With: CECILIA POWELL 12/04/14 1425 BY SMITHERSJ    Report Status 12/09/2014 FINAL  Final   Organism ID, Bacteria ENTEROCOCCUS SPECIES  Final      Susceptibility   Enterococcus species - MIC*    AMPICILLIN RESISTANT      VANCOMYCIN Value in next row Sensitive      <=0.5 SENSITIVEPerformed at [place]  Culture, blood (routine x 2)     Status: None   Collection Time: 11/30/14  7:15 PM  Result Value Ref Range Status   Specimen Description BLOOD LEFT WRIST  Final   Special Requests BOTTLES DRAWN AEROBIC AND ANAEROBIC 10CC EACH  Final   Culture   Final    ENTEROCOCCUS  SPECIES Note: SUSCEPTIBILITIES PERFORMED ON PREVIOUS CULTURE WITHIN THE LAST 5 DAYS. Performed at [facility]    Report Status 12/09/2014 FINAL  Final  Blood culture (routine x 2)     Status: None   Collection Time: 12/06/14 10:00 PM  Result Value Ref Range Status   Specimen Description BLOOD RIGHT ARM  Final   Special Requests BOTTLES DRAWN AEROBIC AND ANAEROBIC 10CC EACH  Final   Culture  Setup Time   Final    GRAM POSITIVE COCCI IN PAIRS AND CHAINS Gram Stain Report Called to,Read Back By and Verified With: JAMES,T. AT 5284 ON 12/07/2014 BY BAUGHAM,M Performed at [facility]    Culture   Final    ENTEROCOCCUS SPECIES Performed at [facility]    Report Status 12/09/2014 FINAL  Final   Organism ID, Bacteria ENTEROCOCCUS SPECIES  Final      Susceptibility   Enterococcus species - MIC*    AMPICILLIN <=2 SENSITIVE Sensitive     VANCOMYCIN 1 SENSITIVE Sensitive     GENTAMICIN SYNERGY SENSITIVE Sensitive     LINEZOLID 2 SENSITIVE Sensitive     * ENTEROCOCCUS SPECIES  Blood culture (routine x 2)     Status: None   Collection Time: 12/06/14 10:06 PM  Result Value Ref Range Status   Specimen Description BLOOD RIGHT HAND  Final   Special Requests BOTTLES DRAWN AEROBIC AND ANAEROBIC 10CC EACH  Final   Culture  Setup Time   Final    GRAM POSITIVE COCCI IN PAIRS AND CHAINS Gram Stain Report Called to,Read Back By and Verified With: JAMES,T. AT 1324 ON  12/07/2014 BY Elza Rafter. Performed at [facility]    Culture   Final    ENTEROCOCCUS SPECIES SUSCEPTIBILITIES PERFORMED ON PREVIOUS CULTURE WITHIN THE LAST 5 DAYS. Performed at [facility]    Report Status 12/09/2014 FINAL  Final  Culture, Urine     Status: None   Collection Time: 12/06/14 10:16 PM  Result Value Ref Range Status   Specimen Description URINE, CATHETERIZED  Final   Special Requests NONE  Final   Culture   Final    NO GROWTH 2 DAYS Performed at [facility]    Report  Status 12/09/2014 FINAL  Final  MRSA PCR Screening     Status: None   Collection Time: 12/07/14 12:25 AM  Result Value Ref Range Status   MRSA by PCR NEGATIVE NEGATIVE Final    Comment:        The GeneXpert MRSA Assay (FDA approved for NASAL specimens only), is one component of a comprehensive MRSA colonization surveillance program. It is not intended to diagnose MRSA infection nor to guide or monitor treatment for MRSA infections.   Culture, blood (routine x 2)     Status: None (Preliminary result)   Collection Time: 12/08/14 12:57 PM  Result Value Ref Range Status   Specimen Description BLOOD LEFT HAND  Final   Special Requests   Final    Immunocompromised BOTTLES DRAWN AEROBIC AND ANAEROBIC 6 CC EACH BOTTLE   Culture PENDING  Incomplete   Report Status PENDING  Incomplete  Culture, blood (routine x 2)     Status: None (Preliminary result)   Collection Time: 12/08/14  1:00 PM  Result Value Ref Range Status   Specimen Description BLOOD RIGHT HAND  Final   Special Requests   Final    Immunocompromised BOTTLES DRAWN AEROBIC AND ANAEROBIC 6 CC EACH BOTTLE   Culture PENDING  Incomplete   Report Status PENDING  Incomplete    Studies/Results: Ct Abdomen Pelvis Wo Contrast  12/09/2014   CLINICAL DATA:  Fever of unknown origin. History of chronic renal disease.  EXAM: CT ABDOMEN AND PELVIS WITHOUT CONTRAST  TECHNIQUE: Multidetector CT imaging of the abdomen and pelvis was performed following the standard protocol without IV contrast.  COMPARISON:  07/06/2013.  FINDINGS: Significant decrease in pericardial fluid with minimal fluid remaining. This measures 7 mm in maximum thickness.  Calcified granuloma in the spleen. Interval mild bilateral perinephric soft tissue stranding and fluid. Interval mid inhomogeneity of the lower pole of the right kidney. Prominent extrarenal pelves are unchanged. No hydronephrosis or urinary tract calculi seen.  Right hip prosthesis with associated streak  artifacts. Calcified granulomata in the spleen. Interval minimal perihepatic free peritoneal fluid. There is also small amount of free peritoneal fluid in the pelvis. No significant change in a low density right adrenal mass, measuring 1.5 x 1.3 cm on image number 19 and -4 Hounsfield units in density.  A small cyst in the dome of the liver on the right is unchanged. Poorly distended gallbladder. Unremarkable non contrasted appearance of the pancreas and left adrenal gland.  Multiple colonic diverticula without evidence of diverticulitis. No enlarged lymph nodes. No evidence of appendicitis. Cardiac pacer and AICD leads. Stable scarring at both lung bases. Severe left hip degenerative changes with bony remodeling. Lumbar and lower thoracic spine degenerative changes and mild scoliosis. Atheromatous arterial calcifications.  IMPRESSION: 1. Interval bilateral perinephric soft tissue stranding and edema and inhomogeneity of the lower pole of the right kidney. These findings are suspicious for  acute pyelonephritis. 2. Interval small amount of free peritoneal fluid. 3. Colonic diverticulosis without evidence of diverticulitis. 4. Stable right adrenal adenoma. 5. Significantly smaller pericardial effusion.   Electronically Signed   By: Claudie Revering M.D.   On: 12/09/2014 19:18      Assessment/Plan:  Principal Problem:   Bacteremia Active Problems:   HTN (hypertension)   History of pulmonary embolism   Type 2 diabetes, uncontrolled, with renal manifestation   CKD (chronic kidney disease), stage IV   Chronic combined systolic and diastolic CHF (congestive heart failure)   Chronic anticoagulation   Atrial fibrillation   Implantable cardioverter-defibrillator-CRT- Mdt   COPD (chronic obstructive pulmonary disease)   Anemia of chronic disease   Fever   SOB (shortness of breath)   ICD (implantable cardioverter-defibrillator) infection   Enterococcal bacteremia   Diabetic feet    [name] is a 69  y.o. female with  69 y.o. female with Enterococcal AICD infection, DM with CKD stage IV, ischemic CHF with ef 15%  #1 AICD infection with Enterococcus:  --ICD has to come out to effect cure --we need clarity on the disparity between cultures done on the 9th and those done during this admission. When I called [facility] 2 days ago they had stated that the "Vitek" machine was unable to do S and they were planning on KB method but apparently they were reportedly able to get Vitek to work but now it showed AMP R but no MIC given?  I have asked BOTH [facility] and [facility] to re-examine BOTH of the cultures they have each run for reliability of results and if possible to repeat the tests.   I am INCLINED to believe the cultures that grew on 10/07/14 since the organism was not reportedly difficult to grow and inclined to believe THOSE SENSI data rather than ones from a culture and Sensi attempt that did not work on first attempt.  If we can be confident that this is an AMP S organism my inclination would be to go with double BETA LACTAM therapy ie high dose AMP and high dose Rocephin x 6 weeks rather than AMP and GENT  For now I have left her on AMP/SULBACTAM and Vanomcyin pending clarity on culture data   Dr. Linus Salmons is back tomorrow.   LOS: 3 days   Alcide Evener 12/10/2014, 1:48 PM

## 2014-12-10 NOTE — Progress Notes (Signed)
[facility]                                                                              Patient Demographics  [name], is a 69 y.o. female, DOB - 09-Jul-1945, XNA:[id number]  Admit date - 12/06/2014   Admitting Physician Phillips Grout, MD  Outpatient Primary MD for the patient is Robert Bellow, MD  LOS - 3   Chief Complaint  Patient presents with  . Shortness of Breath       Brief HPI  69 yo female h/o chf, ckd, aicd, PE, on coumadin comes in with over one day of fever, chills and today rigors. Pt has been having a mild cough but nothing too impressive. No swelling in legs. No rashes. No chest pain or abdominal pain. No dysuria or change in urinary flow. Some mild sob, no nasal congestion. No n/v/d. No sick contacts. No pain or swelling around her aicd site. Her temp is over 102 in the ED, source unclear. Asked to admit for fever. Pt given rocephin in ED for possible uti. No neck pain, vision changes.    Assessment & Plan    Principal Problem: Enterococcal bacteremia: ICD lead endocarditis - per blood culture drawn 11/30/14 during last hospitalization, Blood cultures drawn 12/06/14 with same.  -Patient was transferred [facility], TEE done which showed moderate to severe MR, severe LV dysfunction, soft tissue attached to the pacemaker lead with mobile portion on the distal end consistent with vegetation - Cardiology and ID following, ICD extraction on 6/22 - Continue IV Unasyn per ID  Active Problems: Chronic systolic and diastolic HF. Echo 09/2014 with EF 15% and grade 2 diastolic dysfunction, remains compensated. - On amiodarone, carvedilol, imdur. - Continue Lasix 40 mg daily, strict I's and O's and daily weights, cardiology following  COPD: remains stable at baseline   Atrial fibrillation:  -  rate controlled, continue amiodarone  - Chadscore 7. On coumadin Per pharmacy   CKD: stage IV -  baseline creatinine 1.8-2.0 -  Restart Lasix  HTN:BP currently stable   Diabetes mellitus - Fairly controlled, continue sliding scale insulin    Code Status:  full code  Family Communication: Discussed in detail with the patient, all imaging results, lab results explained to the patient    Disposition Plan: Hopefully next week  Time Spent in minutes 25 minutes  Procedure TEE  Consults  Cardiology ID  DVT Prophylaxis  warfarin  Medications  Scheduled Meds: . amiodarone  200 mg Oral Daily  . ampicillin-sulbactam (UNASYN) IV  3 g Intravenous Q12H  . antiseptic oral rinse  7 mL Mouth Rinse BID  . carvedilol  9.375 mg Oral BID WC  . colchicine  0.6 mg Oral BID  . digoxin  0.0625 mg Oral QODAY  . enoxaparin (LOVENOX) injection  30 mg Subcutaneous Q24H  . feeding supplement (ENSURE ENLIVE)  237 mL Oral BID BM  . furosemide  40 mg Oral Daily  . glimepiride  2 mg Oral Daily  . insulin aspart  0-15 Units Subcutaneous TID WC  . insulin aspart  0-5 Units Subcutaneous QHS  . isosorbide mononitrate  30 mg Oral Daily  . levothyroxine  25 mcg Oral QAC breakfast  . metoCLOPramide  5 mg Oral QID  . pravastatin  80 mg Oral QHS  . sodium chloride  3 mL Intravenous Q12H  . vancomycin  750 mg Intravenous Q24H  . warfarin  2.5 mg Oral ONCE-1800   Continuous Infusions:  PRN Meds:.sodium chloride, acetaminophen **OR** acetaminophen, ondansetron **OR** ondansetron (ZOFRAN) IV, ondansetron, oxyCODONE-acetaminophen, polyethylene glycol, sodium chloride   Antibiotics   Anti-infectives    Start     Dose/Rate Route Frequency Ordered Stop   12/10/14 0600  Ampicillin-Sulbactam (UNASYN) 3 g in sodium chloride 0.9 % 100 mL IVPB     3 g 100 mL/hr over 60 Minutes Intravenous Every 12 hours 12/09/14 1831     12/09/14 1845  Ampicillin-Sulbactam (UNASYN) 3 g in sodium chloride 0.9 % 100 mL IVPB     3 g 100 mL/hr over 60 Minutes Intravenous  Once 12/09/14 1831 12/09/14 2148   12/09/14 0600  levofloxacin (LEVAQUIN) IVPB 750  mg  Status:  Discontinued     750 mg 100 mL/hr over 90 Minutes Intravenous Every 48 hours 12/07/14 1124 12/08/14 1210   12/08/14 1400  ampicillin (OMNIPEN) 2 g in sodium chloride 0.9 % 50 mL IVPB  Status:  Discontinued     2 g 150 mL/hr over 20 Minutes Intravenous 3 times per day 12/08/14 1222 12/09/14 1037   12/08/14 1000  vancomycin (VANCOCIN) IVPB 750 mg/150 ml premix     750 mg 150 mL/hr over 60 Minutes Intravenous Every 24 hours 12/07/14 1115     12/07/14 1030  vancomycin (VANCOCIN) 1,500 mg in sodium chloride 0.9 % 500 mL IVPB     1,500 mg 250 mL/hr over 120 Minutes Intravenous  Once 12/07/14 0920 12/07/14 1359   12/07/14 0100  levofloxacin (LEVAQUIN) IVPB 750 mg     750 mg 100 mL/hr over 90 Minutes Intravenous  Once 12/07/14 0053 12/07/14 0301   12/06/14 2300  cefTRIAXone (ROCEPHIN) 1 g in dextrose 5 % 50 mL IVPB     1 g 100 mL/hr over 30 Minutes Intravenous  Once 12/06/14 2254 12/06/14 2338        Subjective:   [name] was seen and examined today. Denies any specific complaints, afebrile, no chest pain or shortness of breath.  Patient denies dizziness, chest pain, shortness of breath, abdominal pain, N/V/D/C, new weakness, numbess, tingling. No acute events overnight.   Objective:   Blood pressure 143/69, pulse 74, temperature 98.6 F (37 C), temperature source Oral, resp. rate 17, height 5\' 4"  (1.626 m), weight 67.8 kg (149 lb 7.6 oz), SpO2 99 %.  Wt Readings from Last 3 Encounters:  12/10/14 67.8 kg (149 lb 7.6 oz)  12/04/14 67.699 kg (149 lb 4 oz)  12/02/14 69.1 kg (152 lb 5.4 oz)     Intake/Output Summary (Last 24 hours) at 12/10/14 1107 Last data filed at 12/10/14 0815  Gross per 24 hour  Intake    740 ml  Output      0 ml  Net    740 ml    Exam  General: Alert and oriented x 3, NAD  HEENT:  PERRLA, EOMI  Neck: Supple, no JVD, no masses  CVS: S1 S2 auscultated, 2/6 SM , AICD in the right chest wall  Respiratory: Decreased breath sounds at the  bases   Abdomen: Soft, nontender, nondistended, + bowel sounds  Ext: no cyanosis clubbing or edema  Neuro: no new deficits  Skin: No rashes  Psych: Normal affect and demeanor, alert and oriented x3    Data Review   Micro Results Recent Results (from the past 240 hour(s))  MRSA PCR Screening     Status: Abnormal   Collection Time: 11/30/14  1:19 PM  Result Value Ref Range Status   MRSA by PCR POSITIVE (A) NEGATIVE Final    Comment:        The GeneXpert MRSA Assay (FDA approved for NASAL specimens only), is one component of a comprehensive MRSA colonization surveillance program. It is not intended to diagnose MRSA infection nor to guide or monitor treatment for MRSA infections. RESULT CALLED TO, READ BACK BY AND VERIFIED WITH: BOWIE,C RN @ 0973 11/30/14 LEONARDA,   Respiratory virus panel     Status: Abnormal   Collection Time: 11/30/14  1:31 PM  Result Value Ref Range Status   Respiratory Syncytial Virus A Negative Negative Final   Respiratory Syncytial Virus B Negative Negative Final   Influenza A Negative Negative Final   Influenza B Negative Negative Final   Parainfluenza 1 Negative Negative Final   Parainfluenza 2 Negative Negative Final   Parainfluenza 3 Negative Negative Final   Metapneumovirus Negative Negative Final   Rhinovirus Positive (A) Negative Final   Adenovirus Negative Negative Final    Comment: (NOTE) Performed At: [facility] [place], [place] [id number] Lindon Romp MD ST:[id number]   Urine culture     Status: None   Collection Time: 11/30/14  1:33 PM  Result Value Ref Range Status   Specimen Description URINE, RANDOM  Final   Special Requests NONE  Final   Colony Count   Final    9,000 COLONIES/ML Performed at [facility]    Culture   Final    INSIGNIFICANT GROWTH Performed at [facility]    Report Status 12/01/2014 FINAL  Final  Culture, blood (routine x 2)     Status: None   Collection  Time: 11/30/14  7:12 PM  Result Value Ref Range Status   Specimen Description BLOOD RIGHT ARM  Final   Special Requests   Final    BOTTLES DRAWN AEROBIC AND ANAEROBIC 10CC BLUE 5CC RED   Culture   Final    ENTEROCOCCUS SPECIES Note: REPORT FAXED BY REQUEST Note: Gram Stain Report Called to,Read Back By and Verified With: CECILIA POWELL 12/04/14 1425 BY SMITHERSJ    Report Status 12/09/2014 FINAL  Final   Organism ID, Bacteria ENTEROCOCCUS SPECIES  Final      Susceptibility   Enterococcus species - MIC*    AMPICILLIN RESISTANT      VANCOMYCIN Value in next row Sensitive      <=0.5 SENSITIVEPerformed at [facility]    * ENTEROCOCCUS SPECIES  Culture, blood (routine x 2)     Status: None   Collection Time: 11/30/14  7:15 PM  Result Value Ref Range Status   Specimen Description BLOOD LEFT WRIST  Final   Special Requests BOTTLES DRAWN AEROBIC AND ANAEROBIC 10CC EACH  Final   Culture   Final    ENTEROCOCCUS SPECIES Note: SUSCEPTIBILITIES PERFORMED ON PREVIOUS CULTURE WITHIN THE LAST 5 DAYS. Performed at [facility]    Report Status 12/09/2014 FINAL  Final  Blood culture (routine x 2)     Status: None   Collection Time: 12/06/14 10:00 PM  Result Value Ref Range Status   Specimen Description BLOOD RIGHT ARM  Final   Special Requests BOTTLES DRAWN  AEROBIC AND ANAEROBIC 10CC EACH  Final   Culture  Setup Time   Final    GRAM POSITIVE COCCI IN PAIRS AND CHAINS Gram Stain Report Called to,Read Back By and Verified With: JAMES,T. AT 1191 ON 12/07/2014 BY BAUGHAM,M Performed at [facility]    Culture   Final    ENTEROCOCCUS SPECIES Performed at [facility]    Report Status 12/09/2014 FINAL  Final   Organism ID, Bacteria ENTEROCOCCUS SPECIES  Final      Susceptibility   Enterococcus species - MIC*    AMPICILLIN <=2 SENSITIVE Sensitive     VANCOMYCIN 1 SENSITIVE Sensitive     GENTAMICIN SYNERGY SENSITIVE Sensitive     LINEZOLID 2 SENSITIVE Sensitive      * ENTEROCOCCUS SPECIES  Blood culture (routine x 2)     Status: None   Collection Time: 12/06/14 10:06 PM  Result Value Ref Range Status   Specimen Description BLOOD RIGHT HAND  Final   Special Requests BOTTLES DRAWN AEROBIC AND ANAEROBIC 10CC EACH  Final   Culture  Setup Time   Final    GRAM POSITIVE COCCI IN PAIRS AND CHAINS Gram Stain Report Called to,Read Back By and Verified With: JAMES,T. AT 1807 ON 12/07/2014 BY BAUGHAM,M. Performed at [facility]    Culture   Final    ENTEROCOCCUS SPECIES SUSCEPTIBILITIES PERFORMED ON PREVIOUS CULTURE WITHIN THE LAST 5 DAYS. Performed at [facility]    Report Status 12/09/2014 FINAL  Final  Culture, Urine     Status: None   Collection Time: 12/06/14 10:16 PM  Result Value Ref Range Status   Specimen Description URINE, CATHETERIZED  Final   Special Requests NONE  Final   Culture   Final    NO GROWTH 2 DAYS Performed at [facility]    Report Status 12/09/2014 FINAL  Final  MRSA PCR Screening     Status: None   Collection Time: 12/07/14 12:25 AM  Result Value Ref Range Status   MRSA by PCR NEGATIVE NEGATIVE Final    Comment:        The GeneXpert MRSA Assay (FDA approved for NASAL specimens only), is one component of a comprehensive MRSA colonization surveillance program. It is not intended to diagnose MRSA infection nor to guide or monitor treatment for MRSA infections.   Culture, blood (routine x 2)     Status: None (Preliminary result)   Collection Time: 12/08/14 12:57 PM  Result Value Ref Range Status   Specimen Description BLOOD LEFT HAND  Final   Special Requests   Final    Immunocompromised BOTTLES DRAWN AEROBIC AND ANAEROBIC 6 CC EACH BOTTLE   Culture PENDING  Incomplete   Report Status PENDING  Incomplete  Culture, blood (routine x 2)     Status: None (Preliminary result)   Collection Time: 12/08/14  1:00 PM  Result Value Ref Range Status   Specimen Description BLOOD RIGHT HAND  Final    Special Requests   Final    Immunocompromised BOTTLES DRAWN AEROBIC AND ANAEROBIC 6 CC EACH BOTTLE   Culture PENDING  Incomplete   Report Status PENDING  Incomplete    Radiology Reports Ct Abdomen Pelvis Wo Contrast  12/09/2014   CLINICAL DATA:  Fever of unknown origin. History of chronic renal disease.  EXAM: CT ABDOMEN AND PELVIS WITHOUT CONTRAST  TECHNIQUE: Multidetector CT imaging of the abdomen and pelvis was performed following the standard protocol without IV contrast.  COMPARISON:  07/06/2013.  FINDINGS: Significant decrease in pericardial fluid with minimal fluid remaining. This measures 7 mm in maximum thickness.  Calcified granuloma in the spleen. Interval mild bilateral perinephric soft tissue stranding and fluid. Interval mid inhomogeneity of the lower pole of the right kidney. Prominent extrarenal pelves are unchanged. No hydronephrosis or urinary tract calculi seen.  Right hip prosthesis with associated streak artifacts. Calcified granulomata in the spleen. Interval minimal perihepatic free peritoneal fluid. There is also small amount of free peritoneal fluid in the pelvis. No significant change in a low density right adrenal mass, measuring 1.5 x 1.3 cm on image number 19 and -4 Hounsfield units in density.  A small cyst in the dome of the liver on the right is unchanged. Poorly distended gallbladder. Unremarkable non contrasted appearance of the pancreas and left adrenal gland.  Multiple colonic diverticula without evidence of diverticulitis. No enlarged lymph nodes. No evidence of appendicitis. Cardiac pacer and AICD leads. Stable scarring at both lung bases. Severe left hip degenerative changes with bony remodeling. Lumbar and lower thoracic spine degenerative changes and mild scoliosis. Atheromatous arterial calcifications.  IMPRESSION: 1. Interval bilateral perinephric soft tissue stranding and edema and inhomogeneity of the lower pole of the right kidney. These findings are suspicious  for acute pyelonephritis. 2. Interval small amount of free peritoneal fluid. 3. Colonic diverticulosis without evidence of diverticulitis. 4. Stable right adrenal adenoma. 5. Significantly smaller pericardial effusion.   Electronically Signed   By: Claudie Revering M.D.   On: 12/09/2014 19:18   Dg Chest 2 View  12/06/2014   CLINICAL DATA:  Acute onset of shortness of breath. Initial encounter.  EXAM: CHEST  2 VIEW  COMPARISON:  Chest radiograph performed 11/30/2014  FINDINGS: The lungs are well-aerated. Trace fluid is suggested along the right minor fissure. Minimal bibasilar atelectasis is noted. There is no evidence of pleural effusion or pneumothorax.  The heart is borderline enlarged. A pacemaker/AICD is noted at the right chest wall, with leads ending at the right atrium, right ventricle and coronary sinus. No acute osseous abnormalities are seen.  IMPRESSION: Trace fluid again suggested along the right minor fissure. Borderline cardiomegaly. Minimal bibasilar atelectasis noted.   Electronically Signed   By: Garald Balding M.D.   On: 12/06/2014 23:03   Dg Chest 2 View  11/30/2014   CLINICAL DATA:  Shortness of breath.  EXAM: CHEST  2 VIEW  COMPARISON:  10/17/2014.  01/10/2014.  FINDINGS: Mediastinum hilar structures are normal. Cardiac pacer with lead tips in right atrium right ventricle. Cardiomegaly. Mild bilateral interstitial prominence with Kerley B-lines noted. These findings are most consistent mild congestive heart failure. Tiny left pleural effusion cannot be excluded. No pneumothorax. Degenerative changes both shoulders.  IMPRESSION: Findings consistent with mild congestive heart failure with pulmonary interstitial edema and tiny left pleural effusion. Cardiac pacer noted in stable position.   Electronically Signed   By: Marcello Moores  Register   On: 11/30/2014 08:42   US Renal  11/30/2014   CLINICAL DATA:  Chronic kidney disease stage 4  EXAM: RENAL / URINARY TRACT ULTRASOUND COMPLETE  COMPARISON:   01/17/2014  FINDINGS: Right Kidney:  Length: 9.6 cm. Extrarenal pelvis on the right similar to prior study. 12 mm cyst midpole. Increased renal echogenicity.  Left Kidney:  Length: 11.1 cm. 12 mm upper pole cyst. Increased echogenicity of the renal cortex.  Bladder:  Appears normal for degree of bladder distention.  IMPRESSION: Medical renal disease.   Electronically Signed   By: Elodia Florence.D.  On: 11/30/2014 17:22    CBC  Recent Labs Lab 12/06/14 2130 12/07/14 0628 12/08/14 0538  WBC 9.6 11.1* 6.4  HGB 10.1* 8.4* 9.0*  HCT 30.7* 25.4* 27.4*  PLT 179 143* 136*  MCV 95.0 96.2 95.8  MCH 31.3 31.8 31.5  MCHC 32.9 33.1 32.8  RDW 15.7* 16.3* 16.2*  LYMPHSABS 0.8  --   --   MONOABS 0.4  --   --   EOSABS 0.1  --   --   BASOSABS 0.0  --   --     Chemistries   Recent Labs Lab 12/06/14 2130 12/07/14 0628 12/08/14 0538  NA 135 138 139  K 4.3 3.8 4.2  CL 99* 101 104  CO2 25 26 27   GLUCOSE 193* 279* 116*  BUN 52* 53* 43*  CREATININE 2.11* 2.18* 1.78*  CALCIUM 8.9 8.5* 8.8*   ------------------------------------------------------------------------------------------------------------------ estimated creatinine clearance is 27.4 mL/min (by C-G formula based on Cr of 1.78). ------------------------------------------------------------------------------------------------------------------ No results for input(s): HGBA1C in the last 72 hours. ------------------------------------------------------------------------------------------------------------------ No results for input(s): CHOL, HDL, LDLCALC, TRIG, CHOLHDL, LDLDIRECT in the last 72 hours. ------------------------------------------------------------------------------------------------------------------ No results for input(s): TSH, T4TOTAL, T3FREE, THYROIDAB in the last 72 hours.  Invalid input(s): FREET3 ------------------------------------------------------------------------------------------------------------------ No  results for input(s): VITAMINB12, FOLATE, FERRITIN, TIBC, IRON, RETICCTPCT in the last 72 hours.  Coagulation profile  Recent Labs Lab 12/06/14 2131 12/07/14 0628 12/08/14 0538 12/09/14 1335 12/10/14 0514  INR 1.26 1.44 1.40 1.40 1.25    No results for input(s): DDIMER in the last 72 hours.  Cardiac Enzymes No results for input(s): CKMB, TROPONINI, MYOGLOBIN in the last 168 hours.  Invalid input(s): CK ------------------------------------------------------------------------------------------------------------------ Invalid input(s): Dell  12/08/14 2120 12/09/14 0627 12/09/14 1153 12/09/14 1627 12/09/14 2118 12/10/14 0556  GLUCAP 107* 95 221* 72 80 91     RAI,RIPUDEEP M.D. [facility] 12/10/2014, 11:07 AM  Pager: [phone]   Between 7am to 7pm - call Pager - [phone]  After 7pm go to [email or web address] - password [email or web address]  Call night coverage person covering after 7pm

## 2014-12-11 ENCOUNTER — Telehealth: Payer: Self-pay | Admitting: Cardiology

## 2014-12-11 ENCOUNTER — Encounter (HOSPITAL_COMMUNITY): Payer: Self-pay | Admitting: Internal Medicine

## 2014-12-11 ENCOUNTER — Encounter: Payer: Commercial Managed Care - HMO | Admitting: *Deleted

## 2014-12-11 DIAGNOSIS — Z1611 Resistance to penicillins: Secondary | ICD-10-CM

## 2014-12-11 DIAGNOSIS — I330 Acute and subacute infective endocarditis: Secondary | ICD-10-CM

## 2014-12-11 LAB — BASIC METABOLIC PANEL
ANION GAP: 7 (ref 5–15)
BUN: 21 mg/dL — ABNORMAL HIGH (ref 6–20)
CHLORIDE: 107 mmol/L (ref 101–111)
CO2: 23 mmol/L (ref 22–32)
Calcium: 8.5 mg/dL — ABNORMAL LOW (ref 8.9–10.3)
Creatinine, Ser: 1.58 mg/dL — ABNORMAL HIGH (ref 0.44–1.00)
GFR calc Af Amer: 37 mL/min — ABNORMAL LOW (ref >60)
GFR, EST NON AFRICAN AMERICAN: 32 mL/min — AB (ref >60)
Glucose, Bld: 81 mg/dL (ref 65–99)
POTASSIUM: 3.6 mmol/L (ref 3.5–5.1)
SODIUM: 137 mmol/L (ref 135–145)

## 2014-12-11 LAB — GLUCOSE, CAPILLARY
GLUCOSE-CAPILLARY: 83 mg/dL (ref 65–99)
Glucose-Capillary: 109 mg/dL — ABNORMAL HIGH (ref 65–99)
Glucose-Capillary: 181 mg/dL — ABNORMAL HIGH (ref 65–99)
Glucose-Capillary: 93 mg/dL (ref 65–99)

## 2014-12-11 LAB — PROTIME-INR
INR: 1.52 — AB (ref 0.00–1.49)
PROTHROMBIN TIME: 18.3 s — AB (ref 11.6–15.2)

## 2014-12-11 LAB — HEMOGLOBIN A1C
HEMOGLOBIN A1C: 6.7 % — AB (ref 4.8–5.6)
MEAN PLASMA GLUCOSE: 146 mg/dL

## 2014-12-11 LAB — HIV ANTIBODY (ROUTINE TESTING W REFLEX): HIV SCREEN 4TH GENERATION: NONREACTIVE

## 2014-12-11 MED ORDER — CEFTRIAXONE SODIUM IN DEXTROSE 40 MG/ML IV SOLN
2.0000 g | Freq: Two times a day (BID) | INTRAVENOUS | Status: DC
  Administered 2014-12-11 – 2014-12-15 (×9): 2 g via INTRAVENOUS
  Filled 2014-12-11 (×10): qty 50

## 2014-12-11 MED ORDER — COLCHICINE 0.6 MG PO TABS
0.6000 mg | ORAL_TABLET | Freq: Every day | ORAL | Status: DC
  Administered 2014-12-12 – 2014-12-15 (×4): 0.6 mg via ORAL
  Filled 2014-12-11 (×4): qty 1

## 2014-12-11 MED ORDER — SODIUM CHLORIDE 0.9 % IV SOLN
2.0000 g | Freq: Three times a day (TID) | INTRAVENOUS | Status: DC
  Administered 2014-12-11 – 2014-12-15 (×12): 2 g via INTRAVENOUS
  Filled 2014-12-11 (×14): qty 2000

## 2014-12-11 MED ORDER — FUROSEMIDE 40 MG PO TABS
40.0000 mg | ORAL_TABLET | Freq: Two times a day (BID) | ORAL | Status: DC
  Administered 2014-12-11 – 2014-12-12 (×2): 40 mg via ORAL
  Filled 2014-12-11 (×4): qty 1

## 2014-12-11 MED ORDER — SODIUM CHLORIDE 0.9 % IV SOLN
2.0000 g | Freq: Four times a day (QID) | INTRAVENOUS | Status: DC
  Administered 2014-12-11: 2 g via INTRAVENOUS
  Filled 2014-12-11 (×2): qty 2000

## 2014-12-11 NOTE — Telephone Encounter (Signed)
LMOVM reminding pt to send remote transmission.   

## 2014-12-11 NOTE — Progress Notes (Signed)
Patient ID: [name], female   DOB: 03-13-1946, 69 y.o.   MRN: [id number]    SUBJECTIVE: The patient is doing well today.  At this time, she denies chest pain, shortness of breath, or any new concerns. Anxious about lead extraction.  Marland Kitchen amiodarone  200 mg Oral Daily  . ampicillin-sulbactam (UNASYN) IV  3 g Intravenous Q12H  . antiseptic oral rinse  7 mL Mouth Rinse BID  . carvedilol  9.375 mg Oral BID WC  . colchicine  0.6 mg Oral BID  . digoxin  0.0625 mg Oral QODAY  . enoxaparin (LOVENOX) injection  30 mg Subcutaneous Q24H  . feeding supplement (ENSURE ENLIVE)  237 mL Oral BID BM  . furosemide  40 mg Oral Daily  . glimepiride  2 mg Oral Daily  . insulin aspart  0-15 Units Subcutaneous TID WC  . insulin aspart  0-5 Units Subcutaneous QHS  . isosorbide mononitrate  30 mg Oral Daily  . levothyroxine  25 mcg Oral QAC breakfast  . metoCLOPramide  5 mg Oral QID  . pravastatin  80 mg Oral QHS  . sodium chloride  3 mL Intravenous Q12H  . vancomycin  750 mg Intravenous Q24H      OBJECTIVE: Physical Exam: Filed Vitals:   12/10/14 0605 12/10/14 1519 12/10/14 1900 12/10/14 2053  BP: 143/69 115/66  127/52  Pulse: 74 71  72  Temp: 98.6 F (37 C) 98.2 F (36.8 C)  98.1 F (36.7 C)  TempSrc: Oral Oral  Oral  Resp: 17 18  20   Height:      Weight: 149 lb 7.6 oz (67.8 kg)     SpO2: 99% 99% 96%     Intake/Output Summary (Last 24 hours) at 12/11/14 0805 Last data filed at 12/11/14 0200  Gross per 24 hour  Intake   1350 ml  Output    100 ml  Net   1250 ml    Telemetry reveals sinus rhythm  GEN- The patient is well appearing, alert and oriented x 3 today.   Head- normocephalic, atraumatic Eyes-  Sclera clear, conjunctiva pink Ears- hearing intact Oropharynx- clear Neck- supple, no JVP Lungs- Clear to ausculation bilaterally, normal work of breathing Heart- Regular rate and rhythm, no murmurs, rubs or gallops, PMI not laterally displaced GI- soft, NT, ND, + BS Extremities-  no clubbing, cyanosis, or edema Skin- no rash or lesion Psych- euthymic mood, full affect Neuro- strength and sensation are intact  LABS: Basic Metabolic Panel:  Recent Labs  12/11/14 0400  NA 137  K 3.6  CL 107  CO2 23  GLUCOSE 81  BUN 21*  CREATININE 1.58*  CALCIUM 8.5*   Liver Function Tests: No results for input(s): AST, ALT, ALKPHOS, BILITOT, PROT, ALBUMIN in the last 72 hours. No results for input(s): LIPASE, AMYLASE in the last 72 hours. CBC: No results for input(s): WBC, NEUTROABS, HGB, HCT, MCV, PLT in the last 72 hours. Cardiac Enzymes: No results for input(s): CKTOTAL, CKMB, CKMBINDEX, TROPONINI in the last 72 hours. BNP: Invalid input(s): POCBNP D-Dimer: No results for input(s): DDIMER in the last 72 hours. Hemoglobin A1C: No results for input(s): HGBA1C in the last 72 hours. Fasting Lipid Panel: No results for input(s): CHOL, HDL, LDLCALC, TRIG, CHOLHDL, LDLDIRECT in the last 72 hours. Thyroid Function Tests: No results for input(s): TSH, T4TOTAL, T3FREE, THYROIDAB in the last 72 hours.  Invalid input(s): FREET3 Anemia Panel: No results for input(s): VITAMINB12, FOLATE, FERRITIN, TIBC, IRON, RETICCTPCT in the last 72  hours.  RADIOLOGY: Ct Abdomen Pelvis Wo Contrast  12/09/2014   CLINICAL DATA:  Fever of unknown origin. History of chronic renal disease.  EXAM: CT ABDOMEN AND PELVIS WITHOUT CONTRAST  TECHNIQUE: Multidetector CT imaging of the abdomen and pelvis was performed following the standard protocol without IV contrast.  COMPARISON:  07/06/2013.  FINDINGS: Significant decrease in pericardial fluid with minimal fluid remaining. This measures 7 mm in maximum thickness.  Calcified granuloma in the spleen. Interval mild bilateral perinephric soft tissue stranding and fluid. Interval mid inhomogeneity of the lower pole of the right kidney. Prominent extrarenal pelves are unchanged. No hydronephrosis or urinary tract calculi seen.  Right hip prosthesis with  associated streak artifacts. Calcified granulomata in the spleen. Interval minimal perihepatic free peritoneal fluid. There is also small amount of free peritoneal fluid in the pelvis. No significant change in a low density right adrenal mass, measuring 1.5 x 1.3 cm on image number 19 and -4 Hounsfield units in density.  A small cyst in the dome of the liver on the right is unchanged. Poorly distended gallbladder. Unremarkable non contrasted appearance of the pancreas and left adrenal gland.  Multiple colonic diverticula without evidence of diverticulitis. No enlarged lymph nodes. No evidence of appendicitis. Cardiac pacer and AICD leads. Stable scarring at both lung bases. Severe left hip degenerative changes with bony remodeling. Lumbar and lower thoracic spine degenerative changes and mild scoliosis. Atheromatous arterial calcifications.  IMPRESSION: 1. Interval bilateral perinephric soft tissue stranding and edema and inhomogeneity of the lower pole of the right kidney. These findings are suspicious for acute pyelonephritis. 2. Interval small amount of free peritoneal fluid. 3. Colonic diverticulosis without evidence of diverticulitis. 4. Stable right adrenal adenoma. 5. Significantly smaller pericardial effusion.   Electronically Signed   By: Claudie Revering M.D.   On: 12/09/2014 19:18   Dg Chest 2 View  12/06/2014   CLINICAL DATA:  Acute onset of shortness of breath. Initial encounter.  EXAM: CHEST  2 VIEW  COMPARISON:  Chest radiograph performed 11/30/2014  FINDINGS: The lungs are well-aerated. Trace fluid is suggested along the right minor fissure. Minimal bibasilar atelectasis is noted. There is no evidence of pleural effusion or pneumothorax.  The heart is borderline enlarged. A pacemaker/AICD is noted at the right chest wall, with leads ending at the right atrium, right ventricle and coronary sinus. No acute osseous abnormalities are seen.  IMPRESSION: Trace fluid again suggested along the right minor  fissure. Borderline cardiomegaly. Minimal bibasilar atelectasis noted.   Electronically Signed   By: Garald Balding M.D.   On: 12/06/2014 23:03   Dg Chest 2 View  11/30/2014   CLINICAL DATA:  Shortness of breath.  EXAM: CHEST  2 VIEW  COMPARISON:  10/17/2014.  01/10/2014.  FINDINGS: Mediastinum hilar structures are normal. Cardiac pacer with lead tips in right atrium right ventricle. Cardiomegaly. Mild bilateral interstitial prominence with Kerley B-lines noted. These findings are most consistent mild congestive heart failure. Tiny left pleural effusion cannot be excluded. No pneumothorax. Degenerative changes both shoulders.  IMPRESSION: Findings consistent with mild congestive heart failure with pulmonary interstitial edema and tiny left pleural effusion. Cardiac pacer noted in stable position.   Electronically Signed   By: Marcello Moores  Register   On: 11/30/2014 08:42   US Renal  11/30/2014   CLINICAL DATA:  Chronic kidney disease stage 4  EXAM: RENAL / URINARY TRACT ULTRASOUND COMPLETE  COMPARISON:  01/17/2014  FINDINGS: Right Kidney:  Length: 9.6 cm. Extrarenal pelvis on the right  similar to prior study. 12 mm cyst midpole. Increased renal echogenicity.  Left Kidney:  Length: 11.1 cm. 12 mm upper pole cyst. Increased echogenicity of the renal cortex.  Bladder:  Appears normal for degree of bladder distention.  IMPRESSION: Medical renal disease.   Electronically Signed   By: Skipper Cliche M.D.   On: 11/30/2014 17:22    ASSESSMENT AND PLAN:  Principal Problem:   Bacteremia Active Problems:   HTN (hypertension)   History of pulmonary embolism   Type 2 diabetes, uncontrolled, with renal manifestation   CKD (chronic kidney disease), stage IV   Chronic combined systolic and diastolic CHF (congestive heart failure)   Chronic anticoagulation   Atrial fibrillation   Implantable cardioverter-defibrillator-CRT- Mdt   COPD (chronic obstructive pulmonary disease)   Anemia of chronic disease   Fever   SOB  (shortness of breath)   ICD (implantable cardioverter-defibrillator) infection   Enterococcal bacteremia   Diabetic feet  Will schedule lead extraction on Wednesday. Patient is appropriately anxious. Coumadin has been held. No need to reverse at this point. Continue lovenox and IV anti-biotics as per ID service. Note question about sensitivites to ampicillin. Patient has not had complete heart block. At this point no indication to place a temporary device. Will continue to treat her heart failure.  Cristopher Peru, MD 12/11/2014 8:05 AM

## 2014-12-11 NOTE — Progress Notes (Signed)
UR COMPLETED  

## 2014-12-11 NOTE — Progress Notes (Signed)
[facility]                                                                              Patient Demographics  [name], is a 69 y.o. female, DOB - [birth year], FTD:[id number]  Admit date - 12/06/2014   Admitting Physician Phillips Grout, MD  Outpatient Primary MD for the patient is Robert Bellow, MD  LOS - 4   Chief Complaint  Patient presents with  . Shortness of Breath       Brief HPI  69 yo female h/o chf, ckd, aicd, PE, on coumadin comes in with over one day of fever, chills and today rigors. Pt has been having a mild cough but nothing too impressive. No swelling in legs. No rashes. No chest pain or abdominal pain. No dysuria or change in urinary flow. Some mild sob, no nasal congestion. No n/v/d. No sick contacts. No pain or swelling around her aicd site. Her temp is over 102 in the ED, source unclear. Asked to admit for fever. Pt given rocephin in ED for possible uti. No neck pain, vision changes.    Assessment & Plan    Principal Problem: Enterococcal bacteremia: ICD lead endocarditis - per blood culture drawn 11/30/14 during last hospitalization, Blood cultures drawn 12/06/14 showed enterococcus sensitive to ampicillin -Patient was transferred [facility], TEE done which showed moderate to severe MR, severe LV dysfunction, soft tissue attached to the pacemaker lead with mobile portion on the distal end consistent with vegetation - Cardiology and ID following, ICD extraction on 6/22 - Antibiotics changed to ampicillin by ID today, Unasyn was discontinued  Active Problems: Chronic systolic and diastolic HF. Echo 09/2014 with EF 15% and grade 2 diastolic dysfunction, remains compensated. - On amiodarone, carvedilol, imdur. - Cardiology following, still 4.9 L positive, increase the Lasix to 40 mg BID - Continue Coreg, digoxin, Lasix, Imdur, statin, not on ACEI/ARB due to renal insufficiency  COPD: remains stable at baseline     Atrial fibrillation:  -  rate controlled, continue amiodarone, digoxin  - Chadscore 7 - On coumadin Per pharmacy   CKD: stage IV -  baseline creatinine 1.8-2.0 - Creatinine at baseline, however overall fluid balance positive, increase Lasix   HTN:BP currently stable   Diabetes mellitus - Fairly controlled, continue sliding scale insulin    Code Status:  full code  Family Communication: Discussed in detail with the patient, all imaging results, lab results explained to the patient    Disposition Plan: Pending AICD extraction on Wednesday  Time Spent in minutes 25 minutes  Procedure TEE  Consults  Cardiology ID  DVT Prophylaxis  warfarin  Medications  Scheduled Meds: . amiodarone  200 mg Oral Daily  . ampicillin (OMNIPEN) IV  2 g Intravenous 4 times per day  . antiseptic oral rinse  7 mL Mouth Rinse BID  . carvedilol  9.375 mg Oral BID WC  . colchicine  0.6 mg Oral BID  . digoxin  0.0625 mg Oral QODAY  . enoxaparin (LOVENOX) injection  30 mg Subcutaneous Q24H  . feeding supplement (ENSURE ENLIVE)  237 mL Oral BID BM  . furosemide  40 mg Oral Daily  . glimepiride  2 mg Oral Daily  . insulin aspart  0-15 Units Subcutaneous TID WC  . insulin aspart  0-5 Units Subcutaneous QHS  . isosorbide mononitrate  30 mg Oral Daily  . levothyroxine  25 mcg Oral QAC breakfast  . metoCLOPramide  5 mg Oral QID  . pravastatin  80 mg Oral QHS  . sodium chloride  3 mL Intravenous Q12H   Continuous Infusions:  PRN Meds:.sodium chloride, acetaminophen **OR** acetaminophen, ondansetron **OR** ondansetron (ZOFRAN) IV, ondansetron, oxyCODONE-acetaminophen, polyethylene glycol, sodium chloride   Antibiotics   Anti-infectives    Start     Dose/Rate Route Frequency Ordered Stop   12/11/14 1000  ampicillin (OMNIPEN) 2 g in sodium chloride 0.9 % 50 mL IVPB     2 g 150 mL/hr over 20 Minutes Intravenous 4 times per day 12/11/14 0840     12/10/14 0600  Ampicillin-Sulbactam (UNASYN) 3 g  in sodium chloride 0.9 % 100 mL IVPB  Status:  Discontinued     3 g 100 mL/hr over 60 Minutes Intravenous Every 12 hours 12/09/14 1831 12/11/14 0840   12/09/14 1845  Ampicillin-Sulbactam (UNASYN) 3 g in sodium chloride 0.9 % 100 mL IVPB     3 g 100 mL/hr over 60 Minutes Intravenous  Once 12/09/14 1831 12/09/14 2148   12/09/14 0600  levofloxacin (LEVAQUIN) IVPB 750 mg  Status:  Discontinued     750 mg 100 mL/hr over 90 Minutes Intravenous Every 48 hours 12/07/14 1124 12/08/14 1210   12/08/14 1400  ampicillin (OMNIPEN) 2 g in sodium chloride 0.9 % 50 mL IVPB  Status:  Discontinued     2 g 150 mL/hr over 20 Minutes Intravenous 3 times per day 12/08/14 1222 12/09/14 1037   12/08/14 1000  vancomycin (VANCOCIN) IVPB 750 mg/150 ml premix  Status:  Discontinued     750 mg 150 mL/hr over 60 Minutes Intravenous Every 24 hours 12/07/14 1115 12/11/14 0840   12/07/14 1030  vancomycin (VANCOCIN) 1,500 mg in sodium chloride 0.9 % 500 mL IVPB     1,500 mg 250 mL/hr over 120 Minutes Intravenous  Once 12/07/14 0920 12/07/14 1359   12/07/14 0100  levofloxacin (LEVAQUIN) IVPB 750 mg     750 mg 100 mL/hr over 90 Minutes Intravenous  Once 12/07/14 0053 12/07/14 0301   12/06/14 2300  cefTRIAXone (ROCEPHIN) 1 g in dextrose 5 % 50 mL IVPB     1 g 100 mL/hr over 30 Minutes Intravenous  Once 12/06/14 2254 12/06/14 2338        Subjective:   Theresea Trautmann was seen and examined today.  afebrile, no new complaints, no chest pain. Denies any dizziness, abdominal pain, N/V/D/C, new weakness, numbess, tingling. No acute events overnight.   Objective:   Blood pressure 127/52, pulse 72, temperature 98.1 F (36.7 C), temperature source Oral, resp. rate 20, height 5\' 4"  (1.626 m), weight 67.8 kg (149 lb 7.6 oz), SpO2 96 %.  Wt Readings from Last 3 Encounters:  12/10/14 67.8 kg (149 lb 7.6 oz)  12/04/14 67.699 kg (149 lb 4 oz)  12/02/14 69.1 kg (152 lb 5.4 oz)     Intake/Output Summary (Last 24 hours) at  12/11/14 1001 Last data filed at 12/11/14 0826  Gross per 24 hour  Intake   1470 ml  Output    100 ml  Net   1370 ml    Exam  General: Alert and oriented x 3, NAD  HEENT:  PERRLA, EOMI  Neck: Supple  CVS: S1 S2 auscultated, 2/6 SM , AICD in the right chest wall  Respiratory: Decreased BS at the bases   Abdomen: Soft, NT, NBS, ND  Ext: no cyanosis clubbing or edema  Neuro: no new deficits  Skin: No rashes  Psych: Normal affect and demeanor, alert and oriented x3    Data Review   Micro Results Recent Results (from the past 240 hour(s))  Blood culture (routine x 2)     Status: None   Collection Time: 12/06/14 10:00 PM  Result Value Ref Range Status   Specimen Description BLOOD RIGHT ARM  Final   Special Requests BOTTLES DRAWN AEROBIC AND ANAEROBIC 10CC EACH  Final   Culture  Setup Time   Final    GRAM POSITIVE COCCI IN PAIRS AND CHAINS Gram Stain Report Called to,Read Back By and Verified With: JAMES,T. AT [place] ON 12/07/2014 BY BAUGHAM,M Performed at [facility]    Culture   Final    ENTEROCOCCUS SPECIES Performed at [facility]    Report Status 12/09/2014 FINAL  Final   Organism ID, Bacteria ENTEROCOCCUS SPECIES  Final      Susceptibility   Enterococcus species - MIC*    AMPICILLIN <=2 SENSITIVE Sensitive     VANCOMYCIN 1 SENSITIVE Sensitive     GENTAMICIN SYNERGY SENSITIVE Sensitive     LINEZOLID 2 SENSITIVE Sensitive     * ENTEROCOCCUS SPECIES  Blood culture (routine x 2)     Status: None   Collection Time: 12/06/14 10:06 PM  Result Value Ref Range Status   Specimen Description BLOOD RIGHT HAND  Final   Special Requests BOTTLES DRAWN AEROBIC AND ANAEROBIC 10CC EACH  Final   Culture  Setup Time   Final    GRAM POSITIVE COCCI IN PAIRS AND CHAINS Gram Stain Report Called to,Read Back By and Verified With: JAMES,T. AT 1807 ON 12/07/2014 BY BAUGHAM,M. Performed at [facility]    Culture   Final    ENTEROCOCCUS  SPECIES SUSCEPTIBILITIES PERFORMED ON PREVIOUS CULTURE WITHIN THE LAST 5 DAYS. Performed at [facility]    Report Status 12/09/2014 FINAL  Final  Culture, Urine     Status: None   Collection Time: 12/06/14 10:16 PM  Result Value Ref Range Status   Specimen Description URINE, CATHETERIZED  Final   Special Requests NONE  Final   Culture   Final    NO GROWTH 2 DAYS Performed at [facility]    Report Status 12/09/2014 FINAL  Final  MRSA PCR Screening     Status: None   Collection Time: 12/07/14 12:25 AM  Result Value Ref Range Status   MRSA by PCR NEGATIVE NEGATIVE Final    Comment:        The GeneXpert MRSA Assay (FDA approved for NASAL specimens only), is one component of a comprehensive MRSA colonization surveillance program. It is not intended to diagnose MRSA infection nor to guide or monitor treatment for MRSA infections.   Culture, blood (routine x 2)     Status: None (Preliminary result)   Collection Time: 12/08/14 12:57 PM  Result Value Ref Range Status   Specimen Description BLOOD LEFT HAND  Final   Special Requests   Final    Immunocompromised BOTTLES DRAWN AEROBIC AND ANAEROBIC 6 CC EACH BOTTLE   Culture PENDING  Incomplete   Report Status PENDING  Incomplete  Culture, blood (routine x 2)     Status: None (Preliminary result)   Collection  Time: 12/08/14  1:00 PM  Result Value Ref Range Status   Specimen Description BLOOD RIGHT HAND  Final   Special Requests   Final    Immunocompromised BOTTLES DRAWN AEROBIC AND ANAEROBIC 6 CC EACH BOTTLE   Culture PENDING  Incomplete   Report Status PENDING  Incomplete  Clostridium Difficile by PCR (not at [facility])     Status: None   Collection Time: 12/10/14  9:27 AM  Result Value Ref Range Status   C difficile by pcr NEGATIVE NEGATIVE Final    Radiology Reports Ct Abdomen Pelvis Wo Contrast  12/09/2014   CLINICAL DATA:  Fever of unknown origin. History of chronic renal disease.  EXAM: CT ABDOMEN AND PELVIS  WITHOUT CONTRAST  TECHNIQUE: Multidetector CT imaging of the abdomen and pelvis was performed following the standard protocol without IV contrast.  COMPARISON:  07/06/2013.  FINDINGS: Significant decrease in pericardial fluid with minimal fluid remaining. This measures 7 mm in maximum thickness.  Calcified granuloma in the spleen. Interval mild bilateral perinephric soft tissue stranding and fluid. Interval mid inhomogeneity of the lower pole of the right kidney. Prominent extrarenal pelves are unchanged. No hydronephrosis or urinary tract calculi seen.  Right hip prosthesis with associated streak artifacts. Calcified granulomata in the spleen. Interval minimal perihepatic free peritoneal fluid. There is also small amount of free peritoneal fluid in the pelvis. No significant change in a low density right adrenal mass, measuring 1.5 x 1.3 cm on image number 19 and -4 Hounsfield units in density.  A small cyst in the dome of the liver on the right is unchanged. Poorly distended gallbladder. Unremarkable non contrasted appearance of the pancreas and left adrenal gland.  Multiple colonic diverticula without evidence of diverticulitis. No enlarged lymph nodes. No evidence of appendicitis. Cardiac pacer and AICD leads. Stable scarring at both lung bases. Severe left hip degenerative changes with bony remodeling. Lumbar and lower thoracic spine degenerative changes and mild scoliosis. Atheromatous arterial calcifications.  IMPRESSION: 1. Interval bilateral perinephric soft tissue stranding and edema and inhomogeneity of the lower pole of the right kidney. These findings are suspicious for acute pyelonephritis. 2. Interval small amount of free peritoneal fluid. 3. Colonic diverticulosis without evidence of diverticulitis. 4. Stable right adrenal adenoma. 5. Significantly smaller pericardial effusion.   Electronically Signed   By: Claudie Revering M.D.   On: 12/09/2014 19:18   Dg Chest 2 View  12/06/2014   CLINICAL DATA:   Acute onset of shortness of breath. Initial encounter.  EXAM: CHEST  2 VIEW  COMPARISON:  Chest radiograph performed 11/30/2014  FINDINGS: The lungs are well-aerated. Trace fluid is suggested along the right minor fissure. Minimal bibasilar atelectasis is noted. There is no evidence of pleural effusion or pneumothorax.  The heart is borderline enlarged. A pacemaker/AICD is noted at the right chest wall, with leads ending at the right atrium, right ventricle and coronary sinus. No acute osseous abnormalities are seen.  IMPRESSION: Trace fluid again suggested along the right minor fissure. Borderline cardiomegaly. Minimal bibasilar atelectasis noted.   Electronically Signed   By: Garald Balding M.D.   On: 12/06/2014 23:03   Dg Chest 2 View  11/30/2014   CLINICAL DATA:  Shortness of breath.  EXAM: CHEST  2 VIEW  COMPARISON:  10/17/2014.  01/10/2014.  FINDINGS: Mediastinum hilar structures are normal. Cardiac pacer with lead tips in right atrium right ventricle. Cardiomegaly. Mild bilateral interstitial prominence with Kerley B-lines noted. These findings are most consistent mild congestive heart failure. Tiny  left pleural effusion cannot be excluded. No pneumothorax. Degenerative changes both shoulders.  IMPRESSION: Findings consistent with mild congestive heart failure with pulmonary interstitial edema and tiny left pleural effusion. Cardiac pacer noted in stable position.   Electronically Signed   By: Marcello Moores  Register   On: 11/30/2014 08:42   US Renal  11/30/2014   CLINICAL DATA:  Chronic kidney disease stage 4  EXAM: RENAL / URINARY TRACT ULTRASOUND COMPLETE  COMPARISON:  01/17/2014  FINDINGS: Right Kidney:  Length: 9.6 cm. Extrarenal pelvis on the right similar to prior study. 12 mm cyst midpole. Increased renal echogenicity.  Left Kidney:  Length: 11.1 cm. 12 mm upper pole cyst. Increased echogenicity of the renal cortex.  Bladder:  Appears normal for degree of bladder distention.  IMPRESSION: Medical renal  disease.   Electronically Signed   By: Skipper Cliche M.D.   On: 11/30/2014 17:22    CBC  Recent Labs Lab 12/06/14 2130 12/07/14 0628 12/08/14 0538  WBC 9.6 11.1* 6.4  HGB 10.1* 8.4* 9.0*  HCT 30.7* 25.4* 27.4*  PLT 179 143* 136*  MCV 95.0 96.2 95.8  MCH 31.3 31.8 31.5  MCHC 32.9 33.1 32.8  RDW 15.7* 16.3* 16.2*  LYMPHSABS 0.8  --   --   MONOABS 0.4  --   --   EOSABS 0.1  --   --   BASOSABS 0.0  --   --     Chemistries   Recent Labs Lab 12/06/14 2130 12/07/14 0628 12/08/14 0538 12/11/14 0400  NA 135 138 139 137  K 4.3 3.8 4.2 3.6  CL 99* 101 104 107  CO2 25 26 27 23   GLUCOSE 193* 279* 116* 81  BUN 52* 53* 43* 21*  CREATININE 2.11* 2.18* 1.78* 1.58*  CALCIUM 8.9 8.5* 8.8* 8.5*   ------------------------------------------------------------------------------------------------------------------ estimated creatinine clearance is 30.9 mL/min (by C-G formula based on Cr of 1.58). ------------------------------------------------------------------------------------------------------------------ No results for input(s): HGBA1C in the last 72 hours. ------------------------------------------------------------------------------------------------------------------ No results for input(s): CHOL, HDL, LDLCALC, TRIG, CHOLHDL, LDLDIRECT in the last 72 hours. ------------------------------------------------------------------------------------------------------------------ No results for input(s): TSH, T4TOTAL, T3FREE, THYROIDAB in the last 72 hours.  Invalid input(s): FREET3 ------------------------------------------------------------------------------------------------------------------ No results for input(s): VITAMINB12, FOLATE, FERRITIN, TIBC, IRON, RETICCTPCT in the last 72 hours.  Coagulation profile  Recent Labs Lab 12/07/14 0628 12/08/14 0538 12/09/14 1335 12/10/14 0514 12/11/14 0400  INR 1.44 1.40 1.40 1.25 1.52*    No results for input(s): DDIMER in the last  72 hours.  Cardiac Enzymes No results for input(s): CKMB, TROPONINI, MYOGLOBIN in the last 168 hours.  Invalid input(s): CK ------------------------------------------------------------------------------------------------------------------ Invalid input(s): [place]  12/09/14 2118 12/10/14 0556 12/10/14 1128 12/10/14 1613 12/10/14 2125 12/11/14 0636  GLUCAP 80 91 194* 112* 107* 109*     Eriana Suliman M.D. [facility] 12/11/2014, 10:01 AM  Pager: [phone]   Between 7am to 7pm - call Pager - [phone]  After 7pm go to [email or web address] - password [email or web address]  Call night coverage person covering after 7pm

## 2014-12-11 NOTE — Progress Notes (Deleted)
SATURATION QUALIFICATIONS: (This note is used to comply with regulatory documentation for home oxygen)  Patient Saturations on Room Air at Rest = 100%  Patient Saturations on Room Air while Ambulating = 98%   Please briefly explain why patient needs home oxygen: Pt has no need for oxygen while ambulating or at rest.

## 2014-12-11 NOTE — Progress Notes (Signed)
ANTIBIOTIC CONSULT NOTE - INITIAL  Pharmacy Consult:  Rocephin Indication:  Enterococcal endocarditis  No Known Allergies  Patient Measurements: Height: 5\' 4"  (162.6 cm) Weight: 149 lb 7.6 oz (67.8 kg) IBW/kg (Calculated) : 54.7  Vital Signs:   Intake/Output from previous day: 06/19 0701 - 06/20 0700 In: 1350 [P.O.:800; IV Piggyback:550] Out: 100 [Urine:100] Intake/Output from this shift: Total I/O In: 360 [P.O.:360] Out: 100 [Urine:100]  Labs:  Recent Labs  12/11/14 0400  CREATININE 1.58*   Estimated Creatinine Clearance: 30.9 mL/min (by C-G formula based on Cr of 1.58). No results for input(s): VANCOTROUGH, VANCOPEAK, VANCORANDOM, GENTTROUGH, GENTPEAK, GENTRANDOM, TOBRATROUGH, TOBRAPEAK, TOBRARND, AMIKACINPEAK, AMIKACINTROU, AMIKACIN in the last 72 hours.   Microbiology: Recent Results (from the past 720 hour(s))  Rapid strep screen   (If patient has fever and/or without cough or runny nose)     Status: None   Collection Time: 11/30/14  8:05 AM  Result Value Ref Range Status   Streptococcus, Group A Screen (Direct) NEGATIVE NEGATIVE Final    Comment: (NOTE) A Rapid Antigen test may result negative if the antigen level in the sample is below the detection level of this test. The FDA has not cleared this test as a stand-alone test therefore the rapid antigen negative result has reflexed to a Group A Strep culture.   Culture, Group A Strep     Status: None   Collection Time: 11/30/14  8:05 AM  Result Value Ref Range Status   Strep A Culture Negative  Final    Comment: (NOTE) Performed At: [facility] [place], [place] [id number] Lindon Romp MD VU:[id number]   MRSA PCR Screening     Status: Abnormal   Collection Time: 11/30/14  1:19 PM  Result Value Ref Range Status   MRSA by PCR POSITIVE (A) NEGATIVE Final    Comment:        The GeneXpert MRSA Assay (FDA approved for NASAL specimens only), is one component of a comprehensive  MRSA colonization surveillance program. It is not intended to diagnose MRSA infection nor to guide or monitor treatment for MRSA infections. RESULT CALLED TO, READ BACK BY AND VERIFIED WITH: BOWIE,C RN @ 1683 11/30/14 LEONARDA,   Respiratory virus panel     Status: Abnormal   Collection Time: 11/30/14  1:31 PM  Result Value Ref Range Status   Respiratory Syncytial Virus A Negative Negative Final   Respiratory Syncytial Virus B Negative Negative Final   Influenza A Negative Negative Final   Influenza B Negative Negative Final   Parainfluenza 1 Negative Negative Final   Parainfluenza 2 Negative Negative Final   Parainfluenza 3 Negative Negative Final   Metapneumovirus Negative Negative Final   Rhinovirus Positive (A) Negative Final   Adenovirus Negative Negative Final    Comment: (NOTE) Performed At: [facility] [place], [place] [id number] Lindon Romp MD ZM:[id number]   Urine culture     Status: None   Collection Time: 11/30/14  1:33 PM  Result Value Ref Range Status   Specimen Description URINE, RANDOM  Final   Special Requests NONE  Final   Colony Count   Final    9,000 COLONIES/ML Performed at [facility]    Culture   Final    INSIGNIFICANT GROWTH Performed at [facility]    Report Status 12/01/2014 FINAL  Final  Culture, blood (routine x 2)     Status: None   Collection Time: 11/30/14  7:12 PM  Result Value Ref Range Status   Specimen Description BLOOD RIGHT ARM  Final   Special Requests   Final    BOTTLES DRAWN AEROBIC AND ANAEROBIC 10CC BLUE 5CC RED   Culture   Final    ENTEROCOCCUS SPECIES Note: REPORT FAXED BY REQUEST Note: Gram Stain Report Called to,Read Back By and Verified With: CECILIA POWELL 12/04/14 1425 BY SMITHERSJ    Report Status 12/09/2014 FINAL  Final   Organism ID, Bacteria ENTEROCOCCUS SPECIES  Final      Susceptibility   Enterococcus species - MIC*    AMPICILLIN RESISTANT      VANCOMYCIN Value  in next row Sensitive      <=0.5 SENSITIVEPerformed at [place]  Culture, blood (routine x 2)     Status: None   Collection Time: 11/30/14  7:15 PM  Result Value Ref Range Status   Specimen Description BLOOD LEFT WRIST  Final   Special Requests BOTTLES DRAWN AEROBIC AND ANAEROBIC 10CC EACH  Final   Culture   Final    ENTEROCOCCUS SPECIES Note: SUSCEPTIBILITIES PERFORMED ON PREVIOUS CULTURE WITHIN THE LAST 5 DAYS. Performed at [facility]    Report Status 12/09/2014 FINAL  Final  Blood culture (routine x 2)     Status: None   Collection Time: 12/06/14 10:00 PM  Result Value Ref Range Status   Specimen Description BLOOD RIGHT ARM  Final   Special Requests BOTTLES DRAWN AEROBIC AND ANAEROBIC 10CC EACH  Final   Culture  Setup Time   Final    GRAM POSITIVE COCCI IN PAIRS AND CHAINS Gram Stain Report Called to,Read Back By and Verified With: JAMES,T. AT 2979 ON 12/07/2014 BY BAUGHAM,M Performed at [facility]    Culture   Final    ENTEROCOCCUS SPECIES Performed at [facility]    Report Status 12/09/2014 FINAL  Final   Organism ID, Bacteria ENTEROCOCCUS SPECIES  Final      Susceptibility   Enterococcus species - MIC*    AMPICILLIN <=2 SENSITIVE Sensitive     VANCOMYCIN 1 SENSITIVE Sensitive     GENTAMICIN SYNERGY SENSITIVE Sensitive     LINEZOLID 2 SENSITIVE Sensitive     * ENTEROCOCCUS SPECIES  Blood culture (routine x 2)     Status: None   Collection Time: 12/06/14 10:06 PM  Result Value Ref Range Status   Specimen Description BLOOD RIGHT HAND  Final   Special Requests BOTTLES DRAWN AEROBIC AND ANAEROBIC 10CC EACH  Final   Culture  Setup Time   Final    GRAM POSITIVE COCCI IN PAIRS AND CHAINS Gram Stain Report Called to,Read Back By and Verified With: JAMES,T. AT 8921 ON 12/07/2014 BY BAUGHAM,M. Performed at [facility]    Culture   Final    ENTEROCOCCUS SPECIES SUSCEPTIBILITIES PERFORMED ON PREVIOUS  CULTURE WITHIN THE LAST 5 DAYS. Performed at [facility]    Report Status 12/09/2014 FINAL  Final  Culture, Urine     Status: None   Collection Time: 12/06/14 10:16 PM  Result Value Ref Range Status   Specimen Description URINE, CATHETERIZED  Final   Special Requests NONE  Final   Culture   Final    NO GROWTH 2 DAYS Performed at [facility]    Report Status 12/09/2014 FINAL  Final  MRSA PCR Screening     Status: None   Collection Time: 12/07/14 12:25 AM  Result Value Ref Range Status  MRSA by PCR NEGATIVE NEGATIVE Final    Comment:        The GeneXpert MRSA Assay (FDA approved for NASAL specimens only), is one component of a comprehensive MRSA colonization surveillance program. It is not intended to diagnose MRSA infection nor to guide or monitor treatment for MRSA infections.   Culture, blood (routine x 2)     Status: None (Preliminary result)   Collection Time: 12/08/14 12:57 PM  Result Value Ref Range Status   Specimen Description BLOOD LEFT HAND  Final   Special Requests   Final    Immunocompromised BOTTLES DRAWN AEROBIC AND ANAEROBIC 6 CC EACH BOTTLE   Culture PENDING  Incomplete   Report Status PENDING  Incomplete  Culture, blood (routine x 2)     Status: None (Preliminary result)   Collection Time: 12/08/14  1:00 PM  Result Value Ref Range Status   Specimen Description BLOOD RIGHT HAND  Final   Special Requests   Final    Immunocompromised BOTTLES DRAWN AEROBIC AND ANAEROBIC 6 CC EACH BOTTLE   Culture PENDING  Incomplete   Report Status PENDING  Incomplete  Clostridium Difficile by PCR (not at [facility])     Status: None   Collection Time: 12/10/14  9:27 AM  Result Value Ref Range Status   C difficile by pcr NEGATIVE NEGATIVE Final    Medical History: Past Medical History  Diagnosis Date  . Cardiomyopathy, nonischemic     a. 1999 nl cath;  b. 12/05 [facility];  c. 10/2005 ICD extraction 2/2 enterococcus bacteremia and Veg on RV lead;  c.  05/2008 low risk Myoview (scarring w/ some evidence of inf ischemia);  d. 11/2012 Echo: EF 15-20%;  e. 01/2013 s/p MDT [place] CRT D, ser # [id number];  f. 05/2013 Echo: EF 15%.  . Enterococcal infection     a. 10/2005 - AICD-explanted  . Pulmonary embolism     a. 07/2005 after total right hip arthroplasty  . Hilar density     a. infrahilar mass/adenopathy on CT scan 5/07; subsequently  resolved  . LBBB (left bundle branch block)   . GERD (gastroesophageal reflux disease)   . Hyperlipidemia   . Hypertension   . Tobacco abuse     a. discontinued in 1997, and then resumed  . Urinary incontinence   . Anemia     a. mild/chronic  . Villous adenoma of colon     a. tubovillous adenomatous polyp with focal high grade dysplasia; presented with hematochezia - followed by Dr. Laural Golden.  . Implantable cardioverter-defibrillator-CRT- Mdt     a.  01/2013 s/p MDT [place] CRT D, ser # [id number]  . Chronic systolic CHF (congestive heart failure)     a. 11/2012 Echo: EF 15-20%;  b. 05/2013 TEE EF 15%.  . Pneumonia 07/2005  . Obstructive sleep apnea     a. mild-did not tolerate CPAP (01/24/2013)  . History of blood transfusion   . Degenerative joint disease     of knees, shoulder, and hips  . PAF (paroxysmal atrial fibrillation)     a. 07/2012 s/p TEE/DCCV;  b. chronic coumadin;  c. 05/2013 Recurrent Afib->TEE/DCCV and amio initiation.  . CKD (chronic kidney disease), stage III     creatinin-1.44 in 1/09; 1.51 in 1/10  . 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  Patient's renal function appears to be at  baseline.  Vanc 6/17 >> 6/20 Amp 6/17 >> 6/18, resumed 6/20 >> CTX 6/20 >> LVQ 6/16 >> 6/17  Unasyn 6/18 >> 6/20   6/15 UCx - negative 6/15 BCx x2 - Enteroccocus (pan sensitive) 6/17 BCx x2 - NGTD  6/19 C.diff - negative   Goal of Therapy:  Resolution of infection   Plan:  - Rocephin 2gm  IV Q12H - Ampicillin 2gm IV Q8H.  Reduced per discussion with Dr. Linus Salmons since still appropriate for renal function but less frequently for ease of outpatient administration - Pharmacy will sign off abx dosing and follow peripherally. - F/U resume Coumadin post ICD extraction, watch plts while on Lovenox    Mayukha Symmonds D. Mina Marble, PharmD, BCPS Pager:  [phone] 12/11/2014, 10:44 AM

## 2014-12-11 NOTE — Consult Note (Signed)
   [facility] CM Inpatient Consult   12/11/2014  [name] 07-Jan-1946 [id number] Patient evaluated for community based chronic disease management services with [place] [facility] as a benefit of patient's [employer] Medicare Insurance. Spoke with patient at bedside to explain [place] Management services. Explained to the patient that she  will receive post discharge  calls and will be evaluated for monthly home visits for assessments and disease process education at no additional cost to her with in-network provider and insurance. Patient states, "I will need to let my husband look this over first.  I just can't afford another bill.  They told me someone might have to come out and help me with some shots or something." Explained to the patient the difference with home health care and care management.    Left contact information and [facility] literature at bedside. Made Inpatient Case Manager aware that [place] of referral and will follow up with the patient after she speaks with her husband. . Of note, [facility] Care Management services does not replace or interfere with any services that are arranged by inpatient case management or social work.  For additional questions or referrals please contact:   Natividad Brood, RN BSN [facility] Liaison  [phone] business mobile phone

## 2014-12-11 NOTE — Progress Notes (Signed)
        [place] for Infectious Disease   Patients cultures from [facility] were repeated by [facility] and NOW show their isolate to be sensitive to AMP  I will DC vancomycin and change unasyn to high dose AMP  I will defer to Dr. Linus Salmons ? To go to double beta lactam vs gent  My preference is for the latter

## 2014-12-11 NOTE — Progress Notes (Signed)
[place] for Infectious Disease  Date of Admission:  12/06/2014  Antibiotics: ampicillin  Subjective: No complaints  Objective: Temp:  [98.1 F (36.7 C)-98.2 F (36.8 C)] 98.1 F (36.7 C) (06/19 2053) Pulse Rate:  [71-72] 72 (06/19 2053) Resp:  [18-20] 20 (06/19 2053) BP: (115-127)/(52-66) 127/52 mmHg (06/19 2053) SpO2:  [96 %-99 %] 96 % (06/19 1900)  General: awake, alert, nad Skin: no rashes Lungs: CTAB Cor: RRR   Lab Results Lab Results  Component Value Date   WBC 6.4 12/08/2014   HGB 9.0* 12/08/2014   HCT 27.4* 12/08/2014   MCV 95.8 12/08/2014   PLT 136* 12/08/2014    Lab Results  Component Value Date   CREATININE 1.58* 12/11/2014   BUN 21* 12/11/2014   NA 137 12/11/2014   K 3.6 12/11/2014   CL 107 12/11/2014   CO2 23 12/11/2014    Lab Results  Component Value Date   ALT 11* 10/26/2014   AST 13* 10/26/2014   ALKPHOS 62 10/26/2014   BILITOT 0.9 10/26/2014      Microbiology: Recent Results (from the past 240 hour(s))  Blood culture (routine x 2)     Status: None   Collection Time: 12/06/14 10:00 PM  Result Value Ref Range Status   Specimen Description BLOOD RIGHT ARM  Final   Special Requests BOTTLES DRAWN AEROBIC AND ANAEROBIC 10CC EACH  Final   Culture  Setup Time   Final    GRAM POSITIVE COCCI IN PAIRS AND CHAINS Gram Stain Report Called to,Read Back By and Verified With: JAMES,T. AT [place] ON 12/07/2014 BY BAUGHAM,M Performed at [facility]    Culture   Final    ENTEROCOCCUS SPECIES Performed at [facility]    Report Status 12/09/2014 FINAL  Final   Organism ID, Bacteria ENTEROCOCCUS SPECIES  Final      Susceptibility   Enterococcus species - MIC*    AMPICILLIN <=2 SENSITIVE Sensitive     VANCOMYCIN 1 SENSITIVE Sensitive     GENTAMICIN SYNERGY SENSITIVE Sensitive     LINEZOLID 2 SENSITIVE Sensitive     * ENTEROCOCCUS SPECIES  Blood culture (routine x 2)     Status: None   Collection Time: 12/06/14 10:06 PM    Result Value Ref Range Status   Specimen Description BLOOD RIGHT HAND  Final   Special Requests BOTTLES DRAWN AEROBIC AND ANAEROBIC 10CC EACH  Final   Culture  Setup Time   Final    GRAM POSITIVE COCCI IN PAIRS AND CHAINS Gram Stain Report Called to,Read Back By and Verified With: JAMES,T. AT 1224 ON 12/07/2014 BY BAUGHAM,M. Performed at [facility]    Culture   Final    ENTEROCOCCUS SPECIES SUSCEPTIBILITIES PERFORMED ON PREVIOUS CULTURE WITHIN THE LAST 5 DAYS. Performed at [facility]    Report Status 12/09/2014 FINAL  Final  Culture, Urine     Status: None   Collection Time: 12/06/14 10:16 PM  Result Value Ref Range Status   Specimen Description URINE, CATHETERIZED  Final   Special Requests NONE  Final   Culture   Final    NO GROWTH 2 DAYS Performed at [facility]    Report Status 12/09/2014 FINAL  Final  MRSA PCR Screening     Status: None   Collection Time: 12/07/14 12:25 AM  Result Value Ref Range Status   MRSA by PCR NEGATIVE NEGATIVE Final    Comment:        The GeneXpert  MRSA Assay (FDA approved for NASAL specimens only), is one component of a comprehensive MRSA colonization surveillance program. It is not intended to diagnose MRSA infection nor to guide or monitor treatment for MRSA infections.   Culture, blood (routine x 2)     Status: None (Preliminary result)   Collection Time: 12/08/14 12:57 PM  Result Value Ref Range Status   Specimen Description BLOOD LEFT HAND  Final   Special Requests   Final    Immunocompromised BOTTLES DRAWN AEROBIC AND ANAEROBIC 6 CC EACH BOTTLE   Culture PENDING  Incomplete   Report Status PENDING  Incomplete  Culture, blood (routine x 2)     Status: None (Preliminary result)   Collection Time: 12/08/14  1:00 PM  Result Value Ref Range Status   Specimen Description BLOOD RIGHT HAND  Final   Special Requests   Final    Immunocompromised BOTTLES DRAWN AEROBIC AND ANAEROBIC 6 CC EACH BOTTLE   Culture  PENDING  Incomplete   Report Status PENDING  Incomplete  Clostridium Difficile by PCR (not at [facility])     Status: None   Collection Time: 12/10/14  9:27 AM  Result Value Ref Range Status   C difficile by pcr NEGATIVE NEGATIVE Final    Studies/Results: Ct Abdomen Pelvis Wo Contrast  12/09/2014   CLINICAL DATA:  Fever of unknown origin. History of chronic renal disease.  EXAM: CT ABDOMEN AND PELVIS WITHOUT CONTRAST  TECHNIQUE: Multidetector CT imaging of the abdomen and pelvis was performed following the standard protocol without IV contrast.  COMPARISON:  07/06/2013.  FINDINGS: Significant decrease in pericardial fluid with minimal fluid remaining. This measures 7 mm in maximum thickness.  Calcified granuloma in the spleen. Interval mild bilateral perinephric soft tissue stranding and fluid. Interval mid inhomogeneity of the lower pole of the right kidney. Prominent extrarenal pelves are unchanged. No hydronephrosis or urinary tract calculi seen.  Right hip prosthesis with associated streak artifacts. Calcified granulomata in the spleen. Interval minimal perihepatic free peritoneal fluid. There is also small amount of free peritoneal fluid in the pelvis. No significant change in a low density right adrenal mass, measuring 1.5 x 1.3 cm on image number 19 and -4 Hounsfield units in density.  A small cyst in the dome of the liver on the right is unchanged. Poorly distended gallbladder. Unremarkable non contrasted appearance of the pancreas and left adrenal gland.  Multiple colonic diverticula without evidence of diverticulitis. No enlarged lymph nodes. No evidence of appendicitis. Cardiac pacer and AICD leads. Stable scarring at both lung bases. Severe left hip degenerative changes with bony remodeling. Lumbar and lower thoracic spine degenerative changes and mild scoliosis. Atheromatous arterial calcifications.  IMPRESSION: 1. Interval bilateral perinephric soft tissue stranding and edema and inhomogeneity of  the lower pole of the right kidney. These findings are suspicious for acute pyelonephritis. 2. Interval small amount of free peritoneal fluid. 3. Colonic diverticulosis without evidence of diverticulitis. 4. Stable right adrenal adenoma. 5. Significantly smaller pericardial effusion.   Electronically Signed   By: Claudie Revering M.D.   On: 12/09/2014 19:18    Assessment/Plan:  1) ICD lead endocarditis - Enterococcus, ICD to be removed Wed.  2/2.  Ampicillin IS sensitive.  Repeat blood cultures ngtd.  Her creat is 1.58 so will avoid gentamicin and use dual beta lactam therapy with ampicillin and ceftriaxone for 6 weeks. This will require bid ceftriaxone and q 8 ampicillin at discharge.    Scharlene Gloss, [place] [facility] [email or web address] [phone] pager   [phone] cell 12/11/2014, 10:35 AM

## 2014-12-12 ENCOUNTER — Encounter: Payer: Self-pay | Admitting: Cardiology

## 2014-12-12 LAB — BASIC METABOLIC PANEL
Anion gap: 8 (ref 5–15)
BUN: 23 mg/dL — ABNORMAL HIGH (ref 6–20)
CALCIUM: 8.8 mg/dL — AB (ref 8.9–10.3)
CO2: 25 mmol/L (ref 22–32)
Chloride: 106 mmol/L (ref 101–111)
Creatinine, Ser: 1.39 mg/dL — ABNORMAL HIGH (ref 0.44–1.00)
GFR, EST AFRICAN AMERICAN: 43 mL/min — AB (ref >60)
GFR, EST NON AFRICAN AMERICAN: 37 mL/min — AB (ref >60)
GLUCOSE: 81 mg/dL (ref 65–99)
POTASSIUM: 3.7 mmol/L (ref 3.5–5.1)
SODIUM: 139 mmol/L (ref 135–145)

## 2014-12-12 LAB — GLUCOSE, CAPILLARY
GLUCOSE-CAPILLARY: 172 mg/dL — AB (ref 65–99)
GLUCOSE-CAPILLARY: 70 mg/dL (ref 65–99)
GLUCOSE-CAPILLARY: 109 mg/dL — AB (ref 65–99)
Glucose-Capillary: 93 mg/dL (ref 65–99)

## 2014-12-12 LAB — PROTIME-INR
INR: 1.54 — AB (ref 0.00–1.49)
Prothrombin Time: 18.5 seconds — ABNORMAL HIGH (ref 11.6–15.2)

## 2014-12-12 MED ORDER — FUROSEMIDE 20 MG PO TABS
60.0000 mg | ORAL_TABLET | Freq: Two times a day (BID) | ORAL | Status: DC
  Administered 2014-12-12 – 2014-12-15 (×7): 60 mg via ORAL
  Filled 2014-12-12 (×8): qty 1

## 2014-12-12 NOTE — Progress Notes (Signed)
Patient ID: [name], female   DOB: 08/25/1945, 69 y.o.   MRN: [id number]    SUBJECTIVE: The patient is doing well today.  At this time, she denies chest pain, shortness of breath, fever or any new concerns.  Marland Kitchen amiodarone  200 mg Oral Daily  . ampicillin (OMNIPEN) IV  2 g Intravenous 3 times per day  . antiseptic oral rinse  7 mL Mouth Rinse BID  . carvedilol  9.375 mg Oral BID WC  . cefTRIAXone (ROCEPHIN)  IV  2 g Intravenous Q12H  . colchicine  0.6 mg Oral Daily  . digoxin  0.0625 mg Oral QODAY  . enoxaparin (LOVENOX) injection  30 mg Subcutaneous Q24H  . feeding supplement (ENSURE ENLIVE)  237 mL Oral BID BM  . furosemide  40 mg Oral BID  . glimepiride  2 mg Oral Daily  . insulin aspart  0-15 Units Subcutaneous TID WC  . insulin aspart  0-5 Units Subcutaneous QHS  . isosorbide mononitrate  30 mg Oral Daily  . levothyroxine  25 mcg Oral QAC breakfast  . metoCLOPramide  5 mg Oral QID  . pravastatin  80 mg Oral QHS  . sodium chloride  3 mL Intravenous Q12H      OBJECTIVE: Physical Exam: Filed Vitals:   12/11/14 1000 12/11/14 1446 12/11/14 2002 12/12/14 0551  BP: 136/64 129/68 127/62 122/54  Pulse: 69 71 80 70  Temp:  98.6 F (37 C) 98.3 F (36.8 C) 98.2 F (36.8 C)  TempSrc:  Oral Oral Oral  Resp:  18 18 18   Height:      Weight:    152 lb 1.6 oz (68.992 kg)  SpO2:  99% 98% 98%    Intake/Output Summary (Last 24 hours) at 12/12/14 0819 Last data filed at 12/12/14 0802  Gross per 24 hour  Intake   1400 ml  Output    525 ml  Net    875 ml    Telemetry reveals sinus rhythm  GEN- The patient is well appearing, alert and oriented x 3 today.   Head- normocephalic, atraumatic Eyes-  Sclera clear, conjunctiva pink Ears- hearing intact Oropharynx- clear Neck- supple, no JVP Lymph- no cervical lymphadenopathy Lungs- Clear to ausculation bilaterally, normal work of breathing Heart- Regular rate and rhythm, no murmurs, rubs or gallops, PMI not laterally  displaced GI- soft, NT, ND, + BS Extremities- no clubbing, cyanosis, or edema Skin- no rash or lesion Psych- euthymic mood, full affect Neuro- strength and sensation are intact  LABS: Basic Metabolic Panel:  Recent Labs  12/11/14 0400 12/12/14 0530  NA 137 139  K 3.6 3.7  CL 107 106  CO2 23 25  GLUCOSE 81 81  BUN 21* 23*  CREATININE 1.58* 1.39*  CALCIUM 8.5* 8.8*   Liver Function Tests: No results for input(s): AST, ALT, ALKPHOS, BILITOT, PROT, ALBUMIN in the last 72 hours. No results for input(s): LIPASE, AMYLASE in the last 72 hours. CBC: No results for input(s): WBC, NEUTROABS, HGB, HCT, MCV, PLT in the last 72 hours. Cardiac Enzymes: No results for input(s): CKTOTAL, CKMB, CKMBINDEX, TROPONINI in the last 72 hours. BNP: Invalid input(s): POCBNP D-Dimer: No results for input(s): DDIMER in the last 72 hours. Hemoglobin A1C:  Recent Labs  12/10/14 0514  HGBA1C 6.7*   Fasting Lipid Panel: No results for input(s): CHOL, HDL, LDLCALC, TRIG, CHOLHDL, LDLDIRECT in the last 72 hours. Thyroid Function Tests: No results for input(s): TSH, T4TOTAL, T3FREE, THYROIDAB in the last 72 hours.  Invalid input(s): FREET3 Anemia Panel: No results for input(s): VITAMINB12, FOLATE, FERRITIN, TIBC, IRON, RETICCTPCT in the last 72 hours.  RADIOLOGY: Ct Abdomen Pelvis Wo Contrast  12/09/2014   CLINICAL DATA:  Fever of unknown origin. History of chronic renal disease.  EXAM: CT ABDOMEN AND PELVIS WITHOUT CONTRAST  TECHNIQUE: Multidetector CT imaging of the abdomen and pelvis was performed following the standard protocol without IV contrast.  COMPARISON:  07/06/2013.  FINDINGS: Significant decrease in pericardial fluid with minimal fluid remaining. This measures 7 mm in maximum thickness.  Calcified granuloma in the spleen. Interval mild bilateral perinephric soft tissue stranding and fluid. Interval mid inhomogeneity of the lower pole of the right kidney. Prominent extrarenal pelves are  unchanged. No hydronephrosis or urinary tract calculi seen.  Right hip prosthesis with associated streak artifacts. Calcified granulomata in the spleen. Interval minimal perihepatic free peritoneal fluid. There is also small amount of free peritoneal fluid in the pelvis. No significant change in a low density right adrenal mass, measuring 1.5 x 1.3 cm on image number 19 and -4 Hounsfield units in density.  A small cyst in the dome of the liver on the right is unchanged. Poorly distended gallbladder. Unremarkable non contrasted appearance of the pancreas and left adrenal gland.  Multiple colonic diverticula without evidence of diverticulitis. No enlarged lymph nodes. No evidence of appendicitis. Cardiac pacer and AICD leads. Stable scarring at both lung bases. Severe left hip degenerative changes with bony remodeling. Lumbar and lower thoracic spine degenerative changes and mild scoliosis. Atheromatous arterial calcifications.  IMPRESSION: 1. Interval bilateral perinephric soft tissue stranding and edema and inhomogeneity of the lower pole of the right kidney. These findings are suspicious for acute pyelonephritis. 2. Interval small amount of free peritoneal fluid. 3. Colonic diverticulosis without evidence of diverticulitis. 4. Stable right adrenal adenoma. 5. Significantly smaller pericardial effusion.   Electronically Signed   By: Claudie Revering M.D.   On: 12/09/2014 19:18   Dg Chest 2 View  12/06/2014   CLINICAL DATA:  Acute onset of shortness of breath. Initial encounter.  EXAM: CHEST  2 VIEW  COMPARISON:  Chest radiograph performed 11/30/2014  FINDINGS: The lungs are well-aerated. Trace fluid is suggested along the right minor fissure. Minimal bibasilar atelectasis is noted. There is no evidence of pleural effusion or pneumothorax.  The heart is borderline enlarged. A pacemaker/AICD is noted at the right chest wall, with leads ending at the right atrium, right ventricle and coronary sinus. No acute osseous  abnormalities are seen.  IMPRESSION: Trace fluid again suggested along the right minor fissure. Borderline cardiomegaly. Minimal bibasilar atelectasis noted.   Electronically Signed   By: Garald Balding M.D.   On: 12/06/2014 23:03   Dg Chest 2 View  11/30/2014   CLINICAL DATA:  Shortness of breath.  EXAM: CHEST  2 VIEW  COMPARISON:  10/17/2014.  01/10/2014.  FINDINGS: Mediastinum hilar structures are normal. Cardiac pacer with lead tips in right atrium right ventricle. Cardiomegaly. Mild bilateral interstitial prominence with Kerley B-lines noted. These findings are most consistent mild congestive heart failure. Tiny left pleural effusion cannot be excluded. No pneumothorax. Degenerative changes both shoulders.  IMPRESSION: Findings consistent with mild congestive heart failure with pulmonary interstitial edema and tiny left pleural effusion. Cardiac pacer noted in stable position.   Electronically Signed   By: Marcello Moores  Register   On: 11/30/2014 08:42   US Renal  11/30/2014   CLINICAL DATA:  Chronic kidney disease stage 4  EXAM: RENAL / URINARY TRACT  ULTRASOUND COMPLETE  COMPARISON:  01/17/2014  FINDINGS: Right Kidney:  Length: 9.6 cm. Extrarenal pelvis on the right similar to prior study. 12 mm cyst midpole. Increased renal echogenicity.  Left Kidney:  Length: 11.1 cm. 12 mm upper pole cyst. Increased echogenicity of the renal cortex.  Bladder:  Appears normal for degree of bladder distention.  IMPRESSION: Medical renal disease.   Electronically Signed   By: Skipper Cliche M.D.   On: 11/30/2014 17:22    ASSESSMENT AND PLAN:  Principal Problem:   Bacteremia Active Problems:   HTN (hypertension)   History of pulmonary embolism   Type 2 diabetes, uncontrolled, with renal manifestation   CKD (chronic kidney disease), stage IV   Chronic combined systolic and diastolic CHF (congestive heart failure)   Chronic anticoagulation   Atrial fibrillation   Implantable cardioverter-defibrillator-CRT- Mdt   COPD  (chronic obstructive pulmonary disease)   Anemia of chronic disease   Fever   SOB (shortness of breath)   ICD (implantable cardioverter-defibrillator) infection   Enterococcal bacteremia   Diabetic feet  Plan - ICD system extraction tomorrow. I have reviewed the risks/benefit/goals/expectations of the procedure and she wishes to proceed. Cristopher Peru, MD 12/12/2014 8:19 AM

## 2014-12-12 NOTE — Progress Notes (Signed)
[facility]                                                                              Patient Demographics  [name], is a 69 y.o. female, DOB - Feb 20, 1946, CHY:[id number]  Admit date - 12/06/2014   Admitting Physician Phillips Grout, MD  Outpatient Primary MD for the patient is Robert Bellow, MD  LOS - 5   Chief Complaint  Patient presents with  . Shortness of Breath       Brief HPI  69 yo female h/o chf, ckd, aicd, PE, on coumadin comes in with over one day of fever, chills and today rigors. Pt has been having a mild cough but nothing too impressive. No swelling in legs. No rashes. No chest pain or abdominal pain. No dysuria or change in urinary flow. Some mild sob, no nasal congestion. No n/v/d. No sick contacts. No pain or swelling around her aicd site. Her temp is over 102 in the ED, source unclear. Asked to admit for fever. Pt given rocephin in ED for possible uti. No neck pain, vision changes.    Assessment & Plan    Principal Problem: Enterococcal bacteremia: ICD lead endocarditis - per blood culture drawn 11/30/14 during last hospitalization, Blood cultures drawn 12/06/14 showed enterococcus sensitive to ampicillin -Patient was transferred [facility], TEE done which showed moderate to severe MR, severe LV dysfunction, soft tissue attached to the pacemaker lead with mobile portion on the distal end consistent with vegetation - Cardiology and ID following, ICD extraction on 6/22 - Antibiotics changed to ampicillin by ID  Active Problems: Chronic systolic and diastolic HF. Echo 09/2014 with EF 15% and grade 2 diastolic dysfunction, remains compensated. - On amiodarone, carvedilol, imdur. - Cardiology following, continue Lasix, I increased it to 60 mg twice a day, 5.4 L positive, weight is still trending up - Continue Coreg, digoxin, Lasix, Imdur, statin, not on ACEI/ARB due to renal insufficiency  COPD: remains stable at  baseline   Atrial fibrillation:  -  rate controlled, continue amiodarone, digoxin  - [place] score 7 - On coumadin Per pharmacy   CKD: stage IV -  baseline creatinine 1.8-2.0 - Creatinine at baseline, however overall fluid balance positive, increase Lasix   HTN:BP currently stable   Diabetes mellitus - Fairly controlled, continue sliding scale insulin    Code Status:  full code  Family Communication: Discussed in detail with the patient, all imaging results, lab results explained to the patient    Disposition Plan: Pending AICD extraction on Wednesday  Time Spent in minutes 25 minutes  Procedure TEE  Consults  Cardiology ID  DVT Prophylaxis  warfarin  Medications  Scheduled Meds: . amiodarone  200 mg Oral Daily  . ampicillin (OMNIPEN) IV  2 g Intravenous 3 times per day  . antiseptic oral rinse  7 mL Mouth Rinse BID  . carvedilol  9.375 mg Oral BID WC  . cefTRIAXone (ROCEPHIN)  IV  2 g Intravenous Q12H  . colchicine  0.6 mg Oral Daily  . digoxin  0.0625 mg Oral QODAY  . enoxaparin (LOVENOX) injection  30 mg Subcutaneous Q24H  .  feeding supplement (ENSURE ENLIVE)  237 mL Oral BID BM  . furosemide  40 mg Oral BID  . glimepiride  2 mg Oral Daily  . insulin aspart  0-15 Units Subcutaneous TID WC  . insulin aspart  0-5 Units Subcutaneous QHS  . isosorbide mononitrate  30 mg Oral Daily  . levothyroxine  25 mcg Oral QAC breakfast  . metoCLOPramide  5 mg Oral QID  . pravastatin  80 mg Oral QHS  . sodium chloride  3 mL Intravenous Q12H   Continuous Infusions:  PRN Meds:.sodium chloride, acetaminophen **OR** acetaminophen, ondansetron **OR** ondansetron (ZOFRAN) IV, ondansetron, oxyCODONE-acetaminophen, polyethylene glycol, sodium chloride   Antibiotics   Anti-infectives    Start     Dose/Rate Route Frequency Ordered Stop   12/11/14 2000  ampicillin (OMNIPEN) 2 g in sodium chloride 0.9 % 50 mL IVPB     2 g 150 mL/hr over 20 Minutes Intravenous 3 times per day  12/11/14 1048     12/11/14 1200  cefTRIAXone (ROCEPHIN) 2 g in dextrose 5 % 50 mL IVPB - Premix     2 g 100 mL/hr over 30 Minutes Intravenous Every 12 hours 12/11/14 1048     12/11/14 1000  ampicillin (OMNIPEN) 2 g in sodium chloride 0.9 % 50 mL IVPB  Status:  Discontinued     2 g 150 mL/hr over 20 Minutes Intravenous 4 times per day 12/11/14 0840 12/11/14 1048   12/10/14 0600  Ampicillin-Sulbactam (UNASYN) 3 g in sodium chloride 0.9 % 100 mL IVPB  Status:  Discontinued     3 g 100 mL/hr over 60 Minutes Intravenous Every 12 hours 12/09/14 1831 12/11/14 0840   12/09/14 1845  Ampicillin-Sulbactam (UNASYN) 3 g in sodium chloride 0.9 % 100 mL IVPB     3 g 100 mL/hr over 60 Minutes Intravenous  Once 12/09/14 1831 12/09/14 2148   12/09/14 0600  levofloxacin (LEVAQUIN) IVPB 750 mg  Status:  Discontinued     750 mg 100 mL/hr over 90 Minutes Intravenous Every 48 hours 12/07/14 1124 12/08/14 1210   12/08/14 1400  ampicillin (OMNIPEN) 2 g in sodium chloride 0.9 % 50 mL IVPB  Status:  Discontinued     2 g 150 mL/hr over 20 Minutes Intravenous 3 times per day 12/08/14 1222 12/09/14 1037   12/08/14 1000  vancomycin (VANCOCIN) IVPB 750 mg/150 ml premix  Status:  Discontinued     750 mg 150 mL/hr over 60 Minutes Intravenous Every 24 hours 12/07/14 1115 12/11/14 0840   12/07/14 1030  vancomycin (VANCOCIN) 1,500 mg in sodium chloride 0.9 % 500 mL IVPB     1,500 mg 250 mL/hr over 120 Minutes Intravenous  Once 12/07/14 0920 12/07/14 1359   12/07/14 0100  levofloxacin (LEVAQUIN) IVPB 750 mg     750 mg 100 mL/hr over 90 Minutes Intravenous  Once 12/07/14 0053 12/07/14 0301   12/06/14 2300  cefTRIAXone (ROCEPHIN) 1 g in dextrose 5 % 50 mL IVPB     1 g 100 mL/hr over 30 Minutes Intravenous  Once 12/06/14 2254 12/06/14 2338        Subjective:   [name] was seen and examined today. Patient feels fine, no complaints, no chest pain or shortness of breath. Denies any dizziness, abdominal pain, N/V/D/C,  new weakness, numbess, tingling. No acute events overnight.   Objective:   Blood pressure 129/58, pulse 70, temperature 98.4 F (36.9 C), temperature source Oral, resp. rate 18, height 5\' 4"  (1.626 m), weight 68.992 kg (152  lb 1.6 oz), SpO2 98 %.  Wt Readings from Last 3 Encounters:  12/12/14 68.992 kg (152 lb 1.6 oz)  12/04/14 67.699 kg (149 lb 4 oz)  12/02/14 69.1 kg (152 lb 5.4 oz)     Intake/Output Summary (Last 24 hours) at 12/12/14 1045 Last data filed at 12/12/14 0802  Gross per 24 hour  Intake   1040 ml  Output    425 ml  Net    615 ml    Exam  General: Alert and oriented x 3, NAD  HEENT:  PERRLA, EOMI  Neck: Supple  CVS: S1 S2 auscultated, 2/6 SM , AICD in the right chest wall  Respiratory: Clear to auscultation bilaterally  Abdomen: Soft, nontender, nondistended, normal bowel sounds  Ext: no cyanosis clubbing or edema  Neuro: no new deficits  Skin: No rashes  Psych: Normal affect and demeanor, alert and oriented x3    Data Review   Micro Results Recent Results (from the past 240 hour(s))  Blood culture (routine x 2)     Status: None   Collection Time: 12/06/14 10:00 PM  Result Value Ref Range Status   Specimen Description BLOOD RIGHT ARM  Final   Special Requests BOTTLES DRAWN AEROBIC AND ANAEROBIC 10CC EACH  Final   Culture  Setup Time   Final    GRAM POSITIVE COCCI IN PAIRS AND CHAINS Gram Stain Report Called to,Read Back By and Verified With: JAMES,T. AT 3716 ON 12/07/2014 BY BAUGHAM,M Performed at [facility]    Culture   Final    ENTEROCOCCUS SPECIES Performed at [facility]    Report Status 12/09/2014 FINAL  Final   Organism ID, Bacteria ENTEROCOCCUS SPECIES  Final      Susceptibility   Enterococcus species - MIC*    AMPICILLIN <=2 SENSITIVE Sensitive     VANCOMYCIN 1 SENSITIVE Sensitive     GENTAMICIN SYNERGY SENSITIVE Sensitive     LINEZOLID 2 SENSITIVE Sensitive     * ENTEROCOCCUS SPECIES  Blood culture  (routine x 2)     Status: None   Collection Time: 12/06/14 10:06 PM  Result Value Ref Range Status   Specimen Description BLOOD RIGHT HAND  Final   Special Requests BOTTLES DRAWN AEROBIC AND ANAEROBIC 10CC EACH  Final   Culture  Setup Time   Final    GRAM POSITIVE COCCI IN PAIRS AND CHAINS Gram Stain Report Called to,Read Back By and Verified With: JAMES,T. AT 1807 ON 12/07/2014 BY BAUGHAM,M. Performed at [facility]    Culture   Final    ENTEROCOCCUS SPECIES SUSCEPTIBILITIES PERFORMED ON PREVIOUS CULTURE WITHIN THE LAST 5 DAYS. Performed at [facility]    Report Status 12/09/2014 FINAL  Final  Culture, Urine     Status: None   Collection Time: 12/06/14 10:16 PM  Result Value Ref Range Status   Specimen Description URINE, CATHETERIZED  Final   Special Requests NONE  Final   Culture   Final    NO GROWTH 2 DAYS Performed at [facility]    Report Status 12/09/2014 FINAL  Final  MRSA PCR Screening     Status: None   Collection Time: 12/07/14 12:25 AM  Result Value Ref Range Status   MRSA by PCR NEGATIVE NEGATIVE Final    Comment:        The GeneXpert MRSA Assay (FDA approved for NASAL specimens only), is one component of a comprehensive MRSA colonization surveillance program. It is not intended to diagnose  MRSA infection nor to guide or monitor treatment for MRSA infections.   Culture, blood (routine x 2)     Status: None (Preliminary result)   Collection Time: 12/08/14 12:57 PM  Result Value Ref Range Status   Specimen Description BLOOD LEFT HAND  Final   Special Requests   Final    Immunocompromised BOTTLES DRAWN AEROBIC AND ANAEROBIC 6 CC EACH BOTTLE   Culture NO GROWTH 3 DAYS  Final   Report Status PENDING  Incomplete  Culture, blood (routine x 2)     Status: None (Preliminary result)   Collection Time: 12/08/14  1:00 PM  Result Value Ref Range Status   Specimen Description BLOOD RIGHT HAND  Final   Special Requests   Final     Immunocompromised BOTTLES DRAWN AEROBIC AND ANAEROBIC 6 CC EACH BOTTLE   Culture NO GROWTH 3 DAYS  Final   Report Status PENDING  Incomplete  Clostridium Difficile by PCR (not at [facility])     Status: None   Collection Time: 12/10/14  9:27 AM  Result Value Ref Range Status   C difficile by pcr NEGATIVE NEGATIVE Final    Radiology Reports Ct Abdomen Pelvis Wo Contrast  12/09/2014   CLINICAL DATA:  Fever of unknown origin. History of chronic renal disease.  EXAM: CT ABDOMEN AND PELVIS WITHOUT CONTRAST  TECHNIQUE: Multidetector CT imaging of the abdomen and pelvis was performed following the standard protocol without IV contrast.  COMPARISON:  07/06/2013.  FINDINGS: Significant decrease in pericardial fluid with minimal fluid remaining. This measures 7 mm in maximum thickness.  Calcified granuloma in the spleen. Interval mild bilateral perinephric soft tissue stranding and fluid. Interval mid inhomogeneity of the lower pole of the right kidney. Prominent extrarenal pelves are unchanged. No hydronephrosis or urinary tract calculi seen.  Right hip prosthesis with associated streak artifacts. Calcified granulomata in the spleen. Interval minimal perihepatic free peritoneal fluid. There is also small amount of free peritoneal fluid in the pelvis. No significant change in a low density right adrenal mass, measuring 1.5 x 1.3 cm on image number 19 and -4 Hounsfield units in density.  A small cyst in the dome of the liver on the right is unchanged. Poorly distended gallbladder. Unremarkable non contrasted appearance of the pancreas and left adrenal gland.  Multiple colonic diverticula without evidence of diverticulitis. No enlarged lymph nodes. No evidence of appendicitis. Cardiac pacer and AICD leads. Stable scarring at both lung bases. Severe left hip degenerative changes with bony remodeling. Lumbar and lower thoracic spine degenerative changes and mild scoliosis. Atheromatous arterial calcifications.  IMPRESSION:  1. Interval bilateral perinephric soft tissue stranding and edema and inhomogeneity of the lower pole of the right kidney. These findings are suspicious for acute pyelonephritis. 2. Interval small amount of free peritoneal fluid. 3. Colonic diverticulosis without evidence of diverticulitis. 4. Stable right adrenal adenoma. 5. Significantly smaller pericardial effusion.   Electronically Signed   By: Claudie Revering M.D.   On: 12/09/2014 19:18   Dg Chest 2 View  12/06/2014   CLINICAL DATA:  Acute onset of shortness of breath. Initial encounter.  EXAM: CHEST  2 VIEW  COMPARISON:  Chest radiograph performed 11/30/2014  FINDINGS: The lungs are well-aerated. Trace fluid is suggested along the right minor fissure. Minimal bibasilar atelectasis is noted. There is no evidence of pleural effusion or pneumothorax.  The heart is borderline enlarged. A pacemaker/AICD is noted at the right chest wall, with leads ending at the right atrium, right ventricle and coronary  sinus. No acute osseous abnormalities are seen.  IMPRESSION: Trace fluid again suggested along the right minor fissure. Borderline cardiomegaly. Minimal bibasilar atelectasis noted.   Electronically Signed   By: Garald Balding M.D.   On: 12/06/2014 23:03   Dg Chest 2 View  11/30/2014   CLINICAL DATA:  Shortness of breath.  EXAM: CHEST  2 VIEW  COMPARISON:  10/17/2014.  01/10/2014.  FINDINGS: Mediastinum hilar structures are normal. Cardiac pacer with lead tips in right atrium right ventricle. Cardiomegaly. Mild bilateral interstitial prominence with Kerley B-lines noted. These findings are most consistent mild congestive heart failure. Tiny left pleural effusion cannot be excluded. No pneumothorax. Degenerative changes both shoulders.  IMPRESSION: Findings consistent with mild congestive heart failure with pulmonary interstitial edema and tiny left pleural effusion. Cardiac pacer noted in stable position.   Electronically Signed   By: Marcello Moores  Register   On:  11/30/2014 08:42   US Renal  11/30/2014   CLINICAL DATA:  Chronic kidney disease stage 4  EXAM: RENAL / URINARY TRACT ULTRASOUND COMPLETE  COMPARISON:  01/17/2014  FINDINGS: Right Kidney:  Length: 9.6 cm. Extrarenal pelvis on the right similar to prior study. 12 mm cyst midpole. Increased renal echogenicity.  Left Kidney:  Length: 11.1 cm. 12 mm upper pole cyst. Increased echogenicity of the renal cortex.  Bladder:  Appears normal for degree of bladder distention.  IMPRESSION: Medical renal disease.   Electronically Signed   By: Skipper Cliche M.D.   On: 11/30/2014 17:22    CBC  Recent Labs Lab 12/06/14 2130 12/07/14 0628 12/08/14 0538  WBC 9.6 11.1* 6.4  HGB 10.1* 8.4* 9.0*  HCT 30.7* 25.4* 27.4*  PLT 179 143* 136*  MCV 95.0 96.2 95.8  MCH 31.3 31.8 31.5  MCHC 32.9 33.1 32.8  RDW 15.7* 16.3* 16.2*  LYMPHSABS 0.8  --   --   MONOABS 0.4  --   --   EOSABS 0.1  --   --   BASOSABS 0.0  --   --     Chemistries   Recent Labs Lab 12/06/14 2130 12/07/14 0628 12/08/14 0538 12/11/14 0400 12/12/14 0530  NA 135 138 139 137 139  K 4.3 3.8 4.2 3.6 3.7  CL 99* 101 104 107 106  CO2 25 26 27 23 25   GLUCOSE 193* 279* 116* 81 81  BUN 52* 53* 43* 21* 23*  CREATININE 2.11* 2.18* 1.78* 1.58* 1.39*  CALCIUM 8.9 8.5* 8.8* 8.5* 8.8*   ------------------------------------------------------------------------------------------------------------------ estimated creatinine clearance is 35.4 mL/min (by C-G formula based on Cr of 1.39). ------------------------------------------------------------------------------------------------------------------  Recent Labs  12/10/14 0514  HGBA1C 6.7*   ------------------------------------------------------------------------------------------------------------------ No results for input(s): CHOL, HDL, LDLCALC, TRIG, CHOLHDL, LDLDIRECT in the last 72  hours. ------------------------------------------------------------------------------------------------------------------ No results for input(s): TSH, T4TOTAL, T3FREE, THYROIDAB in the last 72 hours.  Invalid input(s): FREET3 ------------------------------------------------------------------------------------------------------------------ No results for input(s): VITAMINB12, FOLATE, FERRITIN, TIBC, IRON, RETICCTPCT in the last 72 hours.  Coagulation profile  Recent Labs Lab 12/08/14 0538 12/09/14 1335 12/10/14 0514 12/11/14 0400 12/12/14 0530  INR 1.40 1.40 1.25 1.52* 1.54*    No results for input(s): DDIMER in the last 72 hours.  Cardiac Enzymes No results for input(s): CKMB, TROPONINI, MYOGLOBIN in the last 168 hours.  Invalid input(s): CK ------------------------------------------------------------------------------------------------------------------ Invalid input(s): Esmond   Recent Labs  12/10/14 2125 12/11/14 0636 12/11/14 1206 12/11/14 1627 12/11/14 2104 12/12/14 0642  GLUCAP 107* 109* 181* 93 61 93     [name] M.D. [facility] 12/12/2014, 10:45 AM  Pager:  [phone]   Between 7am to 7pm - call Pager - [phone]  After 7pm go to [email or web address] - password [email or web address]  Call night coverage person covering after 7pm

## 2014-12-13 ENCOUNTER — Inpatient Hospital Stay (HOSPITAL_COMMUNITY): Payer: Commercial Managed Care - HMO | Admitting: Certified Registered"

## 2014-12-13 ENCOUNTER — Inpatient Hospital Stay (HOSPITAL_COMMUNITY): Payer: Commercial Managed Care - HMO

## 2014-12-13 ENCOUNTER — Encounter: Payer: Self-pay | Admitting: Surgery

## 2014-12-13 ENCOUNTER — Encounter (HOSPITAL_COMMUNITY)
Admission: EM | Disposition: A | Discharge status: Home Health | Payer: Self-pay | Source: Home / Self Care | Attending: Internal Medicine

## 2014-12-13 DIAGNOSIS — D638 Anemia in other chronic diseases classified elsewhere: Secondary | ICD-10-CM

## 2014-12-13 DIAGNOSIS — T827XXD Infection and inflammatory reaction due to other cardiac and vascular devices, implants and grafts, subsequent encounter: Secondary | ICD-10-CM

## 2014-12-13 DIAGNOSIS — N289 Disorder of kidney and ureter, unspecified: Secondary | ICD-10-CM

## 2014-12-13 DIAGNOSIS — T827XXS Infection and inflammatory reaction due to other cardiac and vascular devices, implants and grafts, sequela: Secondary | ICD-10-CM

## 2014-12-13 DIAGNOSIS — I10 Essential (primary) hypertension: Secondary | ICD-10-CM

## 2014-12-13 DIAGNOSIS — E1165 Type 2 diabetes mellitus with hyperglycemia: Secondary | ICD-10-CM

## 2014-12-13 DIAGNOSIS — Z7901 Long term (current) use of anticoagulants: Secondary | ICD-10-CM

## 2014-12-13 DIAGNOSIS — I482 Chronic atrial fibrillation: Secondary | ICD-10-CM

## 2014-12-13 DIAGNOSIS — E1129 Type 2 diabetes mellitus with other diabetic kidney complication: Secondary | ICD-10-CM

## 2014-12-13 DIAGNOSIS — E11621 Type 2 diabetes mellitus with foot ulcer: Secondary | ICD-10-CM

## 2014-12-13 HISTORY — PX: ICD LEAD REMOVAL: SHX5855

## 2014-12-13 LAB — BASIC METABOLIC PANEL
ANION GAP: 10 (ref 5–15)
BUN: 22 mg/dL — ABNORMAL HIGH (ref 6–20)
CHLORIDE: 102 mmol/L (ref 101–111)
CO2: 26 mmol/L (ref 22–32)
CREATININE: 1.49 mg/dL — AB (ref 0.44–1.00)
Calcium: 9.1 mg/dL (ref 8.9–10.3)
GFR calc Af Amer: 40 mL/min — ABNORMAL LOW (ref >60)
GFR calc non Af Amer: 34 mL/min — ABNORMAL LOW (ref >60)
Glucose, Bld: 73 mg/dL (ref 65–99)
POTASSIUM: 4 mmol/L (ref 3.5–5.1)
Sodium: 138 mmol/L (ref 135–145)

## 2014-12-13 LAB — TYPE AND SCREEN
ABO/RH(D): A NEG
Antibody Screen: NEGATIVE

## 2014-12-13 LAB — GLUCOSE, CAPILLARY
GLUCOSE-CAPILLARY: 122 mg/dL — AB (ref 65–99)
GLUCOSE-CAPILLARY: 104 mg/dL — AB (ref 65–99)
Glucose-Capillary: 99 mg/dL (ref 65–99)
Glucose-Capillary: 90 mg/dL (ref 65–99)
Glucose-Capillary: 96 mg/dL (ref 65–99)

## 2014-12-13 LAB — PROTIME-INR
INR: 1.42 (ref 0.00–1.49)
Prothrombin Time: 17.5 seconds — ABNORMAL HIGH (ref 11.6–15.2)

## 2014-12-13 SURGERY — REMOVAL, ELECTRODE LEAD, ICD
Anesthesia: General | Site: Chest

## 2014-12-13 MED ORDER — PROPOFOL 10 MG/ML IV BOLUS
INTRAVENOUS | Status: DC | PRN
  Administered 2014-12-13: 20 mg via INTRAVENOUS
  Administered 2014-12-13: 60 mg via INTRAVENOUS
  Administered 2014-12-13: 30 mg via INTRAVENOUS

## 2014-12-13 MED ORDER — ONDANSETRON HCL 4 MG/2ML IJ SOLN
INTRAMUSCULAR | Status: DC | PRN
  Administered 2014-12-13: 4 mg via INTRAVENOUS

## 2014-12-13 MED ORDER — LACTATED RINGERS IV SOLN
INTRAVENOUS | Status: DC | PRN
  Administered 2014-12-13: 08:00:00 via INTRAVENOUS

## 2014-12-13 MED ORDER — SODIUM CHLORIDE 0.9 % IJ SOLN
INTRAMUSCULAR | Status: AC
  Filled 2014-12-13: qty 10

## 2014-12-13 MED ORDER — LIDOCAINE HCL (PF) 1 % IJ SOLN
INTRAMUSCULAR | Status: AC
  Filled 2014-12-13: qty 30

## 2014-12-13 MED ORDER — EPHEDRINE SULFATE 50 MG/ML IJ SOLN
INTRAMUSCULAR | Status: DC | PRN
  Administered 2014-12-13: 5 mg via INTRAVENOUS

## 2014-12-13 MED ORDER — SUCCINYLCHOLINE CHLORIDE 20 MG/ML IJ SOLN
INTRAMUSCULAR | Status: AC
  Filled 2014-12-13: qty 1

## 2014-12-13 MED ORDER — MIDAZOLAM HCL 2 MG/2ML IJ SOLN
INTRAMUSCULAR | Status: AC
  Filled 2014-12-13: qty 2

## 2014-12-13 MED ORDER — ROCURONIUM BROMIDE 100 MG/10ML IV SOLN
INTRAVENOUS | Status: DC | PRN
  Administered 2014-12-13: 30 mg via INTRAVENOUS

## 2014-12-13 MED ORDER — ONDANSETRON HCL 4 MG/2ML IJ SOLN
INTRAMUSCULAR | Status: AC
  Filled 2014-12-13: qty 2

## 2014-12-13 MED ORDER — FENTANYL CITRATE (PF) 250 MCG/5ML IJ SOLN
INTRAMUSCULAR | Status: DC | PRN
  Administered 2014-12-13: 100 ug via INTRAVENOUS
  Administered 2014-12-13: 50 ug via INTRAVENOUS

## 2014-12-13 MED ORDER — PROPOFOL 10 MG/ML IV BOLUS
INTRAVENOUS | Status: AC
  Filled 2014-12-13: qty 20

## 2014-12-13 MED ORDER — SODIUM CHLORIDE 0.9 % IR SOLN
Status: DC | PRN
  Administered 2014-12-13: 500 mL

## 2014-12-13 MED ORDER — SODIUM CHLORIDE 0.9 % IR SOLN
Freq: Once | Status: AC
  Administered 2014-12-13: 500 mL
  Filled 2014-12-13: qty 2

## 2014-12-13 MED ORDER — PHENYLEPHRINE 40 MCG/ML (10ML) SYRINGE FOR IV PUSH (FOR BLOOD PRESSURE SUPPORT)
PREFILLED_SYRINGE | INTRAVENOUS | Status: AC
  Filled 2014-12-13: qty 10

## 2014-12-13 MED ORDER — ACETAMINOPHEN 325 MG PO TABS: 325.0000 mg | ORAL_TABLET | ORAL | Status: DC | PRN

## 2014-12-13 MED ORDER — LIDOCAINE HCL (PF) 1 % IJ SOLN
INTRAMUSCULAR | Status: DC | PRN
  Administered 2014-12-13: 30 mL

## 2014-12-13 MED ORDER — ONDANSETRON HCL 4 MG/2ML IJ SOLN: 4.0000 mg | Freq: Four times a day (QID) | INTRAMUSCULAR | Status: DC | PRN

## 2014-12-13 MED ORDER — PHENYLEPHRINE HCL 10 MG/ML IJ SOLN
INTRAMUSCULAR | Status: DC | PRN
  Administered 2014-12-13: 80 ug via INTRAVENOUS

## 2014-12-13 MED ORDER — LIDOCAINE HCL (CARDIAC) 20 MG/ML IV SOLN
INTRAVENOUS | Status: AC
  Filled 2014-12-13: qty 5

## 2014-12-13 MED ORDER — PHENYLEPHRINE HCL 10 MG/ML IJ SOLN
10.0000 mg | INTRAVENOUS | Status: DC | PRN
  Administered 2014-12-13: 20 ug/min via INTRAVENOUS

## 2014-12-13 MED ORDER — NOREPINEPHRINE BITARTRATE 1 MG/ML IV SOLN
0.0000 ug/min | INTRAVENOUS | Status: DC
  Filled 2014-12-13: qty 4

## 2014-12-13 MED ORDER — FENTANYL CITRATE (PF) 250 MCG/5ML IJ SOLN
INTRAMUSCULAR | Status: AC
  Filled 2014-12-13: qty 5

## 2014-12-13 MED ORDER — NEOSTIGMINE METHYLSULFATE 10 MG/10ML IV SOLN
INTRAVENOUS | Status: DC | PRN
  Administered 2014-12-13: 3 mg via INTRAVENOUS

## 2014-12-13 MED ORDER — SODIUM CHLORIDE 0.9 % IV SOLN
INTRAVENOUS | Status: DC | PRN
  Administered 2014-12-13: 10:00:00 via INTRAVENOUS

## 2014-12-13 MED ORDER — GLYCOPYRROLATE 0.2 MG/ML IJ SOLN
INTRAMUSCULAR | Status: DC | PRN
  Administered 2014-12-13: 0.4 mg via INTRAVENOUS

## 2014-12-13 MED ORDER — MIDAZOLAM HCL 5 MG/5ML IJ SOLN
INTRAMUSCULAR | Status: DC | PRN
  Administered 2014-12-13: 2 mg via INTRAVENOUS

## 2014-12-13 MED ORDER — EPHEDRINE SULFATE 50 MG/ML IJ SOLN
INTRAMUSCULAR | Status: AC
  Filled 2014-12-13: qty 1

## 2014-12-13 MED ORDER — ROCURONIUM BROMIDE 50 MG/5ML IV SOLN
INTRAVENOUS | Status: AC
  Filled 2014-12-13: qty 1

## 2014-12-13 SURGICAL SUPPLY — 56 items
BAG BANDED W/RUBBER/TAPE 36X54 (MISCELLANEOUS) IMPLANT
BAG DECANTER FOR FLEXI CONT (MISCELLANEOUS) ×3 IMPLANT
BLADE 10 SAFETY STRL DISP (BLADE) ×3 IMPLANT
BLADE STERNUM SYSTEM 6 (BLADE) ×3 IMPLANT
BLADE SURG 10 STRL SS (BLADE) ×3 IMPLANT
BLADE SURG ROTATE 9660 (MISCELLANEOUS) IMPLANT
BNDG COHESIVE 4X5 WHT NS (GAUZE/BANDAGES/DRESSINGS) ×3 IMPLANT
CANISTER SUCTION 2500CC (MISCELLANEOUS) ×3 IMPLANT
COIL ONE TIE COMPRESSION (MISCELLANEOUS) ×3 IMPLANT
CONT SPEC 4OZ CLIKSEAL STRL BL (MISCELLANEOUS) ×3 IMPLANT
COVER BACK TABLE 60X90IN (DRAPES) ×3 IMPLANT
COVER TABLE BACK 60X90 (DRAPES) ×3 IMPLANT
DRAPE C-ARM 42X72 X-RAY (DRAPES) IMPLANT
DRAPE CARDIOVASCULAR INCISE (DRAPES) ×2
DRAPE INCISE IOBAN 66X45 STRL (DRAPES) IMPLANT
DRAPE PROXIMA HALF (DRAPES) ×6 IMPLANT
DRAPE SRG 135X102X78XABS (DRAPES) ×1 IMPLANT
DRSG TEGADERM 4X4.75 (GAUZE/BANDAGES/DRESSINGS) ×3 IMPLANT
ELECT REM PT RETURN 9FT ADLT (ELECTROSURGICAL) ×6
ELECTRODE REM PT RTRN 9FT ADLT (ELECTROSURGICAL) ×2 IMPLANT
GAUZE PACKING IODOFORM 1 (PACKING) IMPLANT
GAUZE PACKING IODOFORM 1X5 (MISCELLANEOUS) ×3 IMPLANT
GAUZE SPONGE 4X4 12PLY STRL (GAUZE/BANDAGES/DRESSINGS) ×3 IMPLANT
GAUZE SPONGE 4X4 16PLY XRAY LF (GAUZE/BANDAGES/DRESSINGS) IMPLANT
GLOVE BIOGEL PI IND STRL 6.5 (GLOVE) ×1 IMPLANT
GLOVE BIOGEL PI IND STRL 7.5 (GLOVE) ×1 IMPLANT
GLOVE BIOGEL PI INDICATOR 6.5 (GLOVE) ×2
GLOVE BIOGEL PI INDICATOR 7.5 (GLOVE) ×2
GLOVE ECLIPSE 8.0 STRL XLNG CF (GLOVE) ×3 IMPLANT
GOWN STRL REUS W/ TWL LRG LVL3 (GOWN DISPOSABLE) ×1 IMPLANT
GOWN STRL REUS W/ TWL XL LVL3 (GOWN DISPOSABLE) ×1 IMPLANT
GOWN STRL REUS W/TWL LRG LVL3 (GOWN DISPOSABLE) ×2
GOWN STRL REUS W/TWL XL LVL3 (GOWN DISPOSABLE) ×3
KIT ROOM TURNOVER OR (KITS) ×3 IMPLANT
NEEDLE PERC 18GX7CM (NEEDLE) ×3 IMPLANT
NS IRRIG 1000ML POUR BTL (IV SOLUTION) IMPLANT
PAD ARMBOARD 7.5X6 YLW CONV (MISCELLANEOUS) ×6 IMPLANT
PAD ELECT DEFIB RADIOL ZOLL (MISCELLANEOUS) ×3 IMPLANT
SHEATH EVOLUTION RL 11F (SHEATH) ×3 IMPLANT
SHEATH PINNACLE 6F 10CM (SHEATH) ×3 IMPLANT
SPONGE GAUZE 4X4 12PLY STER LF (GAUZE/BANDAGES/DRESSINGS) ×6 IMPLANT
STYLET LIBERATOR LOCKING (MISCELLANEOUS) ×3 IMPLANT
SUT PROLENE 2 0 CT2 30 (SUTURE) ×3 IMPLANT
SUT PROLENE 2 0 SH DA (SUTURE) IMPLANT
SUT SILK 1 TIES 10X30 (SUTURE) ×3 IMPLANT
SUT VIC AB 2-0 CT2 18 VCP726D (SUTURE) IMPLANT
SUT VIC AB 3-0 X1 27 (SUTURE) IMPLANT
TAPE CLOTH SURG 6X10 WHT LF (GAUZE/BANDAGES/DRESSINGS) ×3 IMPLANT
TOWEL OR 17X24 6PK STRL BLUE (TOWEL DISPOSABLE) ×6 IMPLANT
TOWEL OR 17X26 10 PK STRL BLUE (TOWEL DISPOSABLE) ×6 IMPLANT
TOWEL OR NON WOVEN STRL DISP B (DISPOSABLE) ×3 IMPLANT
TRAY FOLEY CATH 16FRSI W/METER (SET/KITS/TRAYS/PACK) ×3 IMPLANT
TUBE CONNECTING 12'X1/4 (SUCTIONS)
TUBE CONNECTING 12X1/4 (SUCTIONS) IMPLANT
WIRE MAILMAN 182CM (WIRE) ×3 IMPLANT
YANKAUER SUCT BULB TIP NO VENT (SUCTIONS) IMPLANT

## 2014-12-13 NOTE — Anesthesia Procedure Notes (Signed)
Procedure Name: Intubation Date/Time: 12/13/2014 9:11 AM Performed by: Julian Reil Pre-anesthesia Checklist: Patient identified, Emergency Drugs available, Suction available and Patient being monitored Patient Re-evaluated:Patient Re-evaluated prior to inductionOxygen Delivery Method: Circle system utilized Preoxygenation: Pre-oxygenation with 100% oxygen Intubation Type: IV induction Ventilation: Mask ventilation without difficulty Laryngoscope Size: Mac and 3 Grade View: Grade I Tube type: Oral Tube size: 7.5 mm Number of attempts: 1 Airway Equipment and Method: Stylet Placement Confirmation: ETT inserted through vocal cords under direct vision,  positive ETCO2 and breath sounds checked- equal and bilateral Secured at: 21 cm Tube secured with: Tape Dental Injury: Teeth and Oropharynx as per pre-operative assessment

## 2014-12-13 NOTE — Progress Notes (Signed)
Contacted on call provider - pt ok to transport without tele.

## 2014-12-13 NOTE — Progress Notes (Signed)
A line d/c with cath intact pressure applied x69minutes and pressure applied

## 2014-12-13 NOTE — Progress Notes (Signed)
[facility]  Date of Admission:  12/06/2014  Antibiotics: Ampicillin and ceftriaxone  Subjective: No complaints  Objective: Temp:  [98.2 F (36.8 C)-98.8 F (37.1 C)] 98.8 F (37.1 C) (06/22 1200) Pulse Rate:  [66-72] 66 (06/22 1200) Resp:  [17-20] 18 (06/22 1200) BP: (121-150)/(58-78) 122/58 mmHg (06/22 1200) SpO2:  [94 %-100 %] 97 % (06/22 1200) Weight:  [147 lb 1.6 oz (66.724 kg)] 147 lb 1.6 oz (66.724 kg) (06/22 0537)  General: awake, alert, nad Skin: no rashes Lungs: CTAB Cor: RRR   Lab Results Lab Results  Component Value Date   WBC 6.4 12/08/2014   HGB 9.0* 12/08/2014   HCT 27.4* 12/08/2014   MCV 95.8 12/08/2014   PLT 136* 12/08/2014    Lab Results  Component Value Date   CREATININE 1.49* 12/13/2014   BUN 22* 12/13/2014   NA 138 12/13/2014   K 4.0 12/13/2014   CL 102 12/13/2014   CO2 26 12/13/2014    Lab Results  Component Value Date   ALT 11* 10/26/2014   AST 13* 10/26/2014   ALKPHOS 62 10/26/2014   BILITOT 0.9 10/26/2014      Microbiology: Recent Results (from the past 240 hour(s))  Blood culture (routine x 2)     Status: None   Collection Time: 12/06/14 10:00 PM  Result Value Ref Range Status   Specimen Description BLOOD RIGHT ARM  Final   Special Requests BOTTLES DRAWN AEROBIC AND ANAEROBIC 10CC EACH  Final   Culture  Setup Time   Final    GRAM POSITIVE COCCI IN PAIRS AND CHAINS Gram Stain Report Called to,Read Back By and Verified With: JAMES,T. AT 1807 ON 12/07/2014 BY BAUGHAM,M Performed at [facility]    Culture   Final    ENTEROCOCCUS SPECIES Performed at [facility]    Report Status 12/09/2014 FINAL  Final   Organism ID, Bacteria ENTEROCOCCUS SPECIES  Final      Susceptibility   Enterococcus species - MIC*    AMPICILLIN <=2 SENSITIVE Sensitive     VANCOMYCIN 1 SENSITIVE Sensitive     GENTAMICIN SYNERGY SENSITIVE Sensitive     LINEZOLID 2 SENSITIVE Sensitive     * ENTEROCOCCUS  SPECIES  Blood culture (routine x 2)     Status: None   Collection Time: 12/06/14 10:06 PM  Result Value Ref Range Status   Specimen Description BLOOD RIGHT HAND  Final   Special Requests BOTTLES DRAWN AEROBIC AND ANAEROBIC 10CC EACH  Final   Culture  Setup Time   Final    GRAM POSITIVE COCCI IN PAIRS AND CHAINS Gram Stain Report Called to,Read Back By and Verified With: JAMES,T. AT 7741 ON 12/07/2014 BY BAUGHAM,M. Performed at [facility]    Culture   Final    ENTEROCOCCUS SPECIES SUSCEPTIBILITIES PERFORMED ON PREVIOUS CULTURE WITHIN THE LAST 5 DAYS. Performed at [facility]    Report Status 12/09/2014 FINAL  Final  Culture, Urine     Status: None   Collection Time: 12/06/14 10:16 PM  Result Value Ref Range Status   Specimen Description URINE, CATHETERIZED  Final   Special Requests NONE  Final   Culture   Final    NO GROWTH 2 DAYS Performed at [facility]    Report Status 12/09/2014 FINAL  Final  MRSA PCR Screening     Status: None   Collection Time: 12/07/14 12:25 AM  Result Value Ref Range Status   MRSA by  PCR NEGATIVE NEGATIVE Final    Comment:        The GeneXpert MRSA Assay (FDA approved for NASAL specimens only), is one component of a comprehensive MRSA colonization surveillance program. It is not intended to diagnose MRSA infection nor to guide or monitor treatment for MRSA infections.   Culture, blood (routine x 2)     Status: None (Preliminary result)   Collection Time: 12/08/14 12:57 PM  Result Value Ref Range Status   Specimen Description BLOOD LEFT HAND  Final   Special Requests   Final    Immunocompromised BOTTLES DRAWN AEROBIC AND ANAEROBIC 6 CC EACH BOTTLE   Culture NO GROWTH 4 DAYS  Final   Report Status PENDING  Incomplete  Culture, blood (routine x 2)     Status: None (Preliminary result)   Collection Time: 12/08/14  1:00 PM  Result Value Ref Range Status   Specimen Description BLOOD RIGHT HAND  Final   Special Requests    Final    Immunocompromised BOTTLES DRAWN AEROBIC AND ANAEROBIC 6 CC EACH BOTTLE   Culture NO GROWTH 4 DAYS  Final   Report Status PENDING  Incomplete  Clostridium Difficile by PCR (not at [facility])     Status: None   Collection Time: 12/10/14  9:27 AM  Result Value Ref Range Status   C difficile by pcr NEGATIVE NEGATIVE Final    Studies/Results: No results found.  Assessment/Plan:  1) ICD lead endocarditis - Enterococcus, ICD removed today.  2/2 positive and repeat cultures negative.  Ampicillin IS sensitive after discussion with micro lab.  Repeat blood cultures ngtd.  Avoiding gentamicin due to renal insufficiency and use dual beta lactam therapy with ampicillin and ceftriaxone for 6 weeks through August 2nd. This will require bid ceftriaxone and q 8 ampicillin at discharge.   picc line ok to put in if her renal insufficiency allows Could replace ICD from ID perspective after 72 hours from today as long as repeat blood cultures remain negative (now day4)  I will follow up again on Friday  COMER, ROBERT, [place] [facility] [place] [email or web address] [phone] pager   [phone] cell 12/13/2014, 2:27 PM

## 2014-12-13 NOTE — Anesthesia Postprocedure Evaluation (Signed)
  Anesthesia Post-op Note  Patient: [name]  Procedure(s) Performed: Procedure(s) with comments: ICD LEAD REMOVAL/EXTRACTION  (N/A) - [place] back up  Patient Location: PACU  Anesthesia Type:General  Level of Consciousness: awake  Airway and Oxygen Therapy: Patient Spontanous Breathing  Post-op Pain: none  Post-op Assessment: Post-op Vital signs reviewed, Patient's Cardiovascular Status Stable, Respiratory Function Stable, Patent Airway, No signs of Nausea or vomiting and Pain level controlled              Post-op Vital Signs: Reviewed and stable  Last Vitals:  Filed Vitals:   12/13/14 1200  BP: 122/58  Pulse: 66  Temp: 37.1 C  Resp: 18    Complications: No apparent anesthesia complications

## 2014-12-13 NOTE — CV Procedure (Signed)
Electrophysiology procedure note  Procedure performed: Extraction of an ICD system  Preprocedure diagnosis: ICD lead endocarditis  Postprocedure diagnosis: Same as preprocedure diagnosis  Description of the procedure: After informed consent was obtained, the patient was taken to the operating room in the fasting state. The anesthesia service was utilized to provide general anesthesia. After the usual preparation and draping, a 7 [place] hemostatic sheath was placed in the right femoral vein. Next 30 cc of lidocaine was infiltrated into the right infraclavicular region. A 6 cm incision was carried out just above the ICD insertion site. Electrocautery was utilized to dissect down to the ICD leads and ICD generator. The generator was removed with gentle traction. The leads were disconnected from the generator and the leads were freed up other fibrous adhesions with electrocautery. The sewing sleeve was cut. The left ventricular lead was first targeted for extraction. An angioplasty guidewire was advanced into the left ventricular lead. The lead was removed with gentle traction. Next the atrial lead was targeted. A 52 cm stylette was inserted into the atrial lead, and the helix retracted. The lead was removed with gentle traction. There were no hemodynamic sequelae. The defibrillator lead was then evaluated. A 58 cm stylette was inserted into the lead and the helix retracted. Gentle traction was placed on the lead and it remained fixed in place. At this point the lead was cut after the stylet was removed. A liberator locking stylette was inserted into the lead being advanced into the tip of the lead. It was locked in place in the usual manner. The proximal portion of the lead was secured with a one tie suture. An 71 French Cook RL dissection sheath was advanced over the defibrillator lead and with gentle traction, the lead was removed in total. There were no hemodynamic consequences. Hemostasis was obtained. The  pocket was irrigated. Electrocautery was utilized to assure hemostasis. The incision was closed with a single layer of interrupted proline suture. A pressure dressing was placed. The 7 French venous sheath was removed. Hemostasis was obtained. The patient was returned to the recovery area in satisfactory condition.  Complications: There were no immediate procedure, patient's  Conclusion: Successful extraction of a biventricular ICD system which had been in place for almost 2 years secondary to enterococcal endocarditis.  Cristopher Peru, M.D.

## 2014-12-13 NOTE — OR Nursing (Signed)
The following leads and generator were removed by Dr. Lovena Le on 12-13-2014:  Leads:  MDT model/Sn [id number] from the Right Atrium  MDT model/SN [id number] v from the Right Ventricle  MDT model/SN [id number] v from the Left Ventricle  Generator:  MDT model/Sn VIVA xT ([id number]) [id number]

## 2014-12-13 NOTE — Progress Notes (Signed)
*  PRELIMINARY RESULTS* Echocardiogram 2D Echocardiogram has been performed.  Leavy Cella 12/13/2014, 9:59 AM

## 2014-12-13 NOTE — Progress Notes (Signed)
[facility]                                                                              Patient Demographics  [name], is a 69 y.o. female, DOB - Jul 18, 1945, OEV:[id number]  Admit date - 12/06/2014   Admitting Physician Phillips Grout, MD Outpatient Primary MD for the patient is Robert Bellow, MD  LOS - 6  Chief Complaint  Patient presents with  . Shortness of Breath       Subjective:   Seen with husband at bedside, denies any significant complaints.  Brief HPI  69 yo female h/o chf, ckd, aicd, PE, on coumadin comes in with over one day of fever, chills and today rigors. Pt has been having a mild cough but nothing too impressive. No swelling in legs. No rashes. No chest pain or abdominal pain. No dysuria or change in urinary flow. Some mild sob, no nasal congestion. No n/v/d. No sick contacts. No pain or swelling around her aicd site. Her temp is over 102 in the ED, source unclear. Asked to admit for fever. Pt given rocephin in ED for possible uti. No neck pain, vision changes.  Assessment & Plan   Enterococcal bacteremia: ICD lead endocarditis - per blood culture drawn 11/30/14 during last hospitalization, Blood cultures drawn 12/06/14 showed enterococcus sensitive to ampicillin -Patient was transferred [facility], TEE done which showed moderate to severe MR, severe LV dysfunction, soft tissue attached to the pacemaker lead with mobile portion on the distal end consistent with vegetation -Status post removal of ICD with its infected leads done by Dr. Lovena Le on 12/13/14 -Seen by ID and recommended Rocephin and ampicillin for total of 6 weeks.  Chronic systolic and diastolic HF - Echo 01/2992 with EF 15% and grade 2 diastolic dysfunction, remains compensated. - On amiodarone, carvedilol, imdur. - Cardiology following, continue Lasix, I increased it to 60 mg twice a day, 5.4 L positive, weight is still trending up - Continue Coreg,  digoxin, Lasix, Imdur, statin, not on ACEI/ARB due to renal insufficiency  COPD: remains stable at baseline   Atrial fibrillation:  -  rate controlled, continue amiodarone, digoxin  - On Coumadin per pharmacy.  - This patients CHA2DS2-VASc Score and unadjusted Ischemic Stroke Rate (% per year) is equal to 9.7 % stroke rate/year from a score of 6 for CHF, HTN, DM, CAD, age 83 and female. Above score calculated as 1 point each if present [CHF, HTN, DM, Vascular=MI/PAD/Aortic Plaque, Age if 65-74, or Female] Above score calculated as 2 points each if present [Age > 75, or Stroke/TIA/TE]  CKD: stage IV -  baseline creatinine 1.8-2.0 - Creatinine at baseline, however overall fluid balance positive, increase Lasix   HTN:BP currently stable   Diabetes mellitus - Fairly controlled, continue sliding scale insulin    Code Status:  full code  Family Communication: Discussed in detail with the patient, all imaging results, lab results explained to the patient    Disposition Plan: Pending AICD extraction on Wednesday  Time Spent in minutes 25 minutes  Procedure TEE  Consults  Cardiology ID  DVT Prophylaxis  warfarin  Medications  Scheduled Meds: . amiodarone  200 mg Oral Daily  . ampicillin (OMNIPEN) IV  2 g Intravenous 3 times per day  . antiseptic oral rinse  7 mL Mouth Rinse BID  . carvedilol  9.375 mg Oral BID WC  . cefTRIAXone (ROCEPHIN)  IV  2 g Intravenous Q12H  . colchicine  0.6 mg Oral Daily  . digoxin  0.0625 mg Oral QODAY  . enoxaparin (LOVENOX) injection  30 mg Subcutaneous Q24H  . feeding supplement (ENSURE ENLIVE)  237 mL Oral BID BM  . furosemide  60 mg Oral BID  . glimepiride  2 mg Oral Daily  . insulin aspart  0-15 Units Subcutaneous TID WC  . insulin aspart  0-5 Units Subcutaneous QHS  . isosorbide mononitrate  30 mg Oral Daily  . levothyroxine  25 mcg Oral QAC breakfast  . metoCLOPramide  5 mg Oral QID  . midazolam      . pravastatin  80 mg Oral QHS    . sodium chloride  3 mL Intravenous Q12H   Continuous Infusions:  PRN Meds:.sodium chloride, [DISCONTINUED] acetaminophen **OR** acetaminophen, acetaminophen, ondansetron **OR** ondansetron (ZOFRAN) IV, ondansetron, oxyCODONE-acetaminophen, polyethylene glycol, sodium chloride   Antibiotics   Anti-infectives    Start     Dose/Rate Route Frequency Ordered Stop   12/13/14 0830  gentamicin (GARAMYCIN) 80 mg in sodium chloride irrigation 0.9 % 500 mL irrigation      Irrigation Once 12/13/14 0829 12/13/14 0940   12/11/14 2000  ampicillin (OMNIPEN) 2 g in sodium chloride 0.9 % 50 mL IVPB     2 g 150 mL/hr over 20 Minutes Intravenous 3 times per day 12/11/14 1048     12/11/14 1200  cefTRIAXone (ROCEPHIN) 2 g in dextrose 5 % 50 mL IVPB - Premix     2 g 100 mL/hr over 30 Minutes Intravenous Every 12 hours 12/11/14 1048     12/11/14 1000  ampicillin (OMNIPEN) 2 g in sodium chloride 0.9 % 50 mL IVPB  Status:  Discontinued     2 g 150 mL/hr over 20 Minutes Intravenous 4 times per day 12/11/14 0840 12/11/14 1048   12/10/14 0600  Ampicillin-Sulbactam (UNASYN) 3 g in sodium chloride 0.9 % 100 mL IVPB  Status:  Discontinued     3 g 100 mL/hr over 60 Minutes Intravenous Every 12 hours 12/09/14 1831 12/11/14 0840   12/09/14 1845  Ampicillin-Sulbactam (UNASYN) 3 g in sodium chloride 0.9 % 100 mL IVPB     3 g 100 mL/hr over 60 Minutes Intravenous  Once 12/09/14 1831 12/09/14 2148   12/09/14 0600  levofloxacin (LEVAQUIN) IVPB 750 mg  Status:  Discontinued     750 mg 100 mL/hr over 90 Minutes Intravenous Every 48 hours 12/07/14 1124 12/08/14 1210   12/08/14 1400  ampicillin (OMNIPEN) 2 g in sodium chloride 0.9 % 50 mL IVPB  Status:  Discontinued     2 g 150 mL/hr over 20 Minutes Intravenous 3 times per day 12/08/14 1222 12/09/14 1037   12/08/14 1000  vancomycin (VANCOCIN) IVPB 750 mg/150 ml premix  Status:  Discontinued     750 mg 150 mL/hr over 60 Minutes Intravenous Every 24 hours 12/07/14 1115  12/11/14 0840   12/07/14 1030  vancomycin (VANCOCIN) 1,500 mg in sodium chloride 0.9 % 500 mL IVPB     1,500 mg 250 mL/hr over 120 Minutes Intravenous  Once 12/07/14 0920 12/07/14 1359   12/07/14 0100  levofloxacin (LEVAQUIN) IVPB 750 mg     750  mg 100 mL/hr over 90 Minutes Intravenous  Once 12/07/14 0053 12/07/14 0301   12/06/14 2300  cefTRIAXone (ROCEPHIN) 1 g in dextrose 5 % 50 mL IVPB     1 g 100 mL/hr over 30 Minutes Intravenous  Once 12/06/14 2254 12/06/14 2338         Objective:   Blood pressure 122/58, pulse 66, temperature 98.8 F (37.1 C), temperature source Oral, resp. rate 18, height 5\' 4"  (1.626 m), weight 66.724 kg (147 lb 1.6 oz), SpO2 97 %.  Wt Readings from Last 3 Encounters:  12/13/14 66.724 kg (147 lb 1.6 oz)  12/04/14 67.699 kg (149 lb 4 oz)  12/02/14 69.1 kg (152 lb 5.4 oz)     Intake/Output Summary (Last 24 hours) at 12/13/14 1557 Last data filed at 12/13/14 1010  Gross per 24 hour  Intake   1230 ml  Output    225 ml  Net   1005 ml    Exam  General: Alert and oriented x 3, NAD  HEENT:  PERRLA, EOMI  Neck: Supple  CVS: S1 S2 auscultated, 2/6 SM , AICD in the right chest wall  Respiratory: Clear to auscultation bilaterally  Abdomen: Soft, nontender, nondistended, normal bowel sounds  Ext: no cyanosis clubbing or edema  Neuro: no new deficits  Skin: No rashes  Psych: Normal affect and demeanor, alert and oriented x3    Data Review   Micro Results Recent Results (from the past 240 hour(s))  Blood culture (routine x 2)     Status: None   Collection Time: 12/06/14 10:00 PM  Result Value Ref Range Status   Specimen Description BLOOD RIGHT ARM  Final   Special Requests BOTTLES DRAWN AEROBIC AND ANAEROBIC 10CC EACH  Final   Culture  Setup Time   Final    GRAM POSITIVE COCCI IN PAIRS AND CHAINS Gram Stain Report Called to,Read Back By and Verified With: JAMES,T. AT 6222 ON 12/07/2014 BY BAUGHAM,M Performed at [facility]     Culture   Final    ENTEROCOCCUS SPECIES Performed at [facility]    Report Status 12/09/2014 FINAL  Final   Organism ID, Bacteria ENTEROCOCCUS SPECIES  Final      Susceptibility   Enterococcus species - MIC*    AMPICILLIN <=2 SENSITIVE Sensitive     VANCOMYCIN 1 SENSITIVE Sensitive     GENTAMICIN SYNERGY SENSITIVE Sensitive     LINEZOLID 2 SENSITIVE Sensitive     * ENTEROCOCCUS SPECIES  Blood culture (routine x 2)     Status: None   Collection Time: 12/06/14 10:06 PM  Result Value Ref Range Status   Specimen Description BLOOD RIGHT HAND  Final   Special Requests BOTTLES DRAWN AEROBIC AND ANAEROBIC 10CC EACH  Final   Culture  Setup Time   Final    GRAM POSITIVE COCCI IN PAIRS AND CHAINS Gram Stain Report Called to,Read Back By and Verified With: JAMES,T. AT 1807 ON 12/07/2014 BY BAUGHAM,M. Performed at [facility]    Culture   Final    ENTEROCOCCUS SPECIES SUSCEPTIBILITIES PERFORMED ON PREVIOUS CULTURE WITHIN THE LAST 5 DAYS. Performed at [facility]    Report Status 12/09/2014 FINAL  Final  Culture, Urine     Status: None   Collection Time: 12/06/14 10:16 PM  Result Value Ref Range Status   Specimen Description URINE, CATHETERIZED  Final   Special Requests NONE  Final   Culture   Final    NO GROWTH 2 DAYS  Performed at [facility]    Report Status 12/09/2014 FINAL  Final  MRSA PCR Screening     Status: None   Collection Time: 12/07/14 12:25 AM  Result Value Ref Range Status   MRSA by PCR NEGATIVE NEGATIVE Final    Comment:        The GeneXpert MRSA Assay (FDA approved for NASAL specimens only), is one component of a comprehensive MRSA colonization surveillance program. It is not intended to diagnose MRSA infection nor to guide or monitor treatment for MRSA infections.   Culture, blood (routine x 2)     Status: None (Preliminary result)   Collection Time: 12/08/14 12:57 PM  Result Value Ref Range Status   Specimen Description  BLOOD LEFT HAND  Final   Special Requests   Final    Immunocompromised BOTTLES DRAWN AEROBIC AND ANAEROBIC 6 CC EACH BOTTLE   Culture NO GROWTH 4 DAYS  Final   Report Status PENDING  Incomplete  Culture, blood (routine x 2)     Status: None (Preliminary result)   Collection Time: 12/08/14  1:00 PM  Result Value Ref Range Status   Specimen Description BLOOD RIGHT HAND  Final   Special Requests   Final    Immunocompromised BOTTLES DRAWN AEROBIC AND ANAEROBIC 6 CC EACH BOTTLE   Culture NO GROWTH 4 DAYS  Final   Report Status PENDING  Incomplete  Clostridium Difficile by PCR (not at [facility])     Status: None   Collection Time: 12/10/14  9:27 AM  Result Value Ref Range Status   C difficile by pcr NEGATIVE NEGATIVE Final    Radiology Reports Ct Abdomen Pelvis Wo Contrast  12/09/2014   CLINICAL DATA:  Fever of unknown origin. History of chronic renal disease.  EXAM: CT ABDOMEN AND PELVIS WITHOUT CONTRAST  TECHNIQUE: Multidetector CT imaging of the abdomen and pelvis was performed following the standard protocol without IV contrast.  COMPARISON:  07/06/2013.  FINDINGS: Significant decrease in pericardial fluid with minimal fluid remaining. This measures 7 mm in maximum thickness.  Calcified granuloma in the spleen. Interval mild bilateral perinephric soft tissue stranding and fluid. Interval mid inhomogeneity of the lower pole of the right kidney. Prominent extrarenal pelves are unchanged. No hydronephrosis or urinary tract calculi seen.  Right hip prosthesis with associated streak artifacts. Calcified granulomata in the spleen. Interval minimal perihepatic free peritoneal fluid. There is also small amount of free peritoneal fluid in the pelvis. No significant change in a low density right adrenal mass, measuring 1.5 x 1.3 cm on image number 19 and -4 Hounsfield units in density.  A small cyst in the dome of the liver on the right is unchanged. Poorly distended gallbladder. Unremarkable non contrasted  appearance of the pancreas and left adrenal gland.  Multiple colonic diverticula without evidence of diverticulitis. No enlarged lymph nodes. No evidence of appendicitis. Cardiac pacer and AICD leads. Stable scarring at both lung bases. Severe left hip degenerative changes with bony remodeling. Lumbar and lower thoracic spine degenerative changes and mild scoliosis. Atheromatous arterial calcifications.  IMPRESSION: 1. Interval bilateral perinephric soft tissue stranding and edema and inhomogeneity of the lower pole of the right kidney. These findings are suspicious for acute pyelonephritis. 2. Interval small amount of free peritoneal fluid. 3. Colonic diverticulosis without evidence of diverticulitis. 4. Stable right adrenal adenoma. 5. Significantly smaller pericardial effusion.   Electronically Signed   By: Claudie Revering M.D.   On: 12/09/2014 19:18   Dg Chest 2 View  12/06/2014   CLINICAL DATA:  Acute onset of shortness of breath. Initial encounter.  EXAM: CHEST  2 VIEW  COMPARISON:  Chest radiograph performed 11/30/2014  FINDINGS: The lungs are well-aerated. Trace fluid is suggested along the right minor fissure. Minimal bibasilar atelectasis is noted. There is no evidence of pleural effusion or pneumothorax.  The heart is borderline enlarged. A pacemaker/AICD is noted at the right chest wall, with leads ending at the right atrium, right ventricle and coronary sinus. No acute osseous abnormalities are seen.  IMPRESSION: Trace fluid again suggested along the right minor fissure. Borderline cardiomegaly. Minimal bibasilar atelectasis noted.   Electronically Signed   By: Garald Balding M.D.   On: 12/06/2014 23:03   Dg Chest 2 View  11/30/2014   CLINICAL DATA:  Shortness of breath.  EXAM: CHEST  2 VIEW  COMPARISON:  10/17/2014.  01/10/2014.  FINDINGS: Mediastinum hilar structures are normal. Cardiac pacer with lead tips in right atrium right ventricle. Cardiomegaly. Mild bilateral interstitial prominence with  Kerley B-lines noted. These findings are most consistent mild congestive heart failure. Tiny left pleural effusion cannot be excluded. No pneumothorax. Degenerative changes both shoulders.  IMPRESSION: Findings consistent with mild congestive heart failure with pulmonary interstitial edema and tiny left pleural effusion. Cardiac pacer noted in stable position.   Electronically Signed   By: Marcello Moores  Register   On: 11/30/2014 08:42   US Renal  11/30/2014   CLINICAL DATA:  Chronic kidney disease stage 4  EXAM: RENAL / URINARY TRACT ULTRASOUND COMPLETE  COMPARISON:  01/17/2014  FINDINGS: Right Kidney:  Length: 9.6 cm. Extrarenal pelvis on the right similar to prior study. 12 mm cyst midpole. Increased renal echogenicity.  Left Kidney:  Length: 11.1 cm. 12 mm upper pole cyst. Increased echogenicity of the renal cortex.  Bladder:  Appears normal for degree of bladder distention.  IMPRESSION: Medical renal disease.   Electronically Signed   By: Skipper Cliche M.D.   On: 11/30/2014 17:22    CBC  Recent Labs Lab 12/06/14 2130 12/07/14 0628 12/08/14 0538  WBC 9.6 11.1* 6.4  HGB 10.1* 8.4* 9.0*  HCT 30.7* 25.4* 27.4*  PLT 179 143* 136*  MCV 95.0 96.2 95.8  MCH 31.3 31.8 31.5  MCHC 32.9 33.1 32.8  RDW 15.7* 16.3* 16.2*  LYMPHSABS 0.8  --   --   MONOABS 0.4  --   --   EOSABS 0.1  --   --   BASOSABS 0.0  --   --     Chemistries   Recent Labs Lab 12/07/14 0628 12/08/14 0538 12/11/14 0400 12/12/14 0530 12/13/14 0512  NA 138 139 137 139 138  K 3.8 4.2 3.6 3.7 4.0  CL 101 104 107 106 102  CO2 26 27 23 25 26   GLUCOSE 279* 116* 81 81 73  BUN 53* 43* 21* 23* 22*  CREATININE 2.18* 1.78* 1.58* 1.39* 1.49*  CALCIUM 8.5* 8.8* 8.5* 8.8* 9.1   ------------------------------------------------------------------------------------------------------------------ estimated creatinine clearance is 32.5 mL/min (by C-G formula based on Cr of  1.49). ------------------------------------------------------------------------------------------------------------------ No results for input(s): HGBA1C in the last 72 hours. ------------------------------------------------------------------------------------------------------------------ No results for input(s): CHOL, HDL, LDLCALC, TRIG, CHOLHDL, LDLDIRECT in the last 72 hours. ------------------------------------------------------------------------------------------------------------------ No results for input(s): TSH, T4TOTAL, T3FREE, THYROIDAB in the last 72 hours.  Invalid input(s): FREET3 ------------------------------------------------------------------------------------------------------------------ No results for input(s): VITAMINB12, FOLATE, FERRITIN, TIBC, IRON, RETICCTPCT in the last 72 hours.  Coagulation profile  Recent Labs Lab 12/09/14 1335 12/10/14 0514 12/11/14 0400 12/12/14 0530 12/13/14 8921  INR 1.40 1.25 1.52* 1.54* 1.42    No results for input(s): DDIMER in the last 72 hours.  Cardiac Enzymes No results for input(s): CKMB, TROPONINI, MYOGLOBIN in the last 168 hours.  Invalid input(s): CK ------------------------------------------------------------------------------------------------------------------ Invalid input(s): [place]  12/12/14 0642 12/12/14 1055 12/12/14 1614 12/12/14 2121 12/13/14 0640 12/13/14 1213  GLUCAP 93 172* 70 109* 99 104*     Marialy Urbanczyk A M.D. [facility] 12/13/2014, 3:57 PM  Pager: [phone]  Between 7am to 7pm - call Pager - [phone]  After 7pm go to [email or web address] - password [email or web address]  Call night coverage person covering after 7pm

## 2014-12-13 NOTE — Anesthesia Preprocedure Evaluation (Addendum)
Anesthesia Evaluation  Patient identified by MRN, date of birth, ID band Patient awake    Reviewed: Allergy & Precautions, H&P , NPO status , Patient's Chart, lab work & pertinent test results, reviewed documented beta blocker date and time   Airway Mallampati: I  TM Distance: >3 FB Neck ROM: Full    Dental  (+) Dental Advisory Given, Missing   Pulmonary shortness of breath, sleep apnea , pneumonia -, COPDCurrent Smoker, former smoker,  breath sounds clear to auscultation        Cardiovascular hypertension, Pt. on medications and Pt. on home beta blockers +CHF + dysrhythmias Atrial Fibrillation + Cardiac Defibrillator Rhythm:Irregular Rate:Tachycardia  Echo 09/2014 - Left ventricle: The cavity size was normal. Wall thickness wasincreased in a pattern of moderate LVH. Systolic function wasseverely reduced. The estimated ejection fraction was in therange of 15% to 20%. Diffuse hypokinesis. Features are consistentwith a pseudonormal left ventricular filling pattern, withconcomitant abnormal relaxation and increased filling pressure(grade 2 diastolic dysfunction). - Aortic valve: Mildly calcified annulus. Trileaflet; mildly thickened leaflets. There was mild to moderate regurgitation.Valve area (VTI): 1.51 cm^2. Valve area (Vmax): 1.67 cm^2.Regurgitation pressure half-time: 479 ms. - Mitral valve: Mildly calcified annulus. Mildly thickened leaflets. There was mild regurgitation. - Left atrium: The atrium was severely dilated. - Right atrium: The atrium was mildly dilated. - Pulmonary arteries: PA peak pressure: 33 mm Hg (S). PASP isborderline elevated. - Technically difficult study.     Neuro/Psych    GI/Hepatic GERD-  ,  Endo/Other  diabetesHypothyroidism   Renal/GU Renal disease     Musculoskeletal  (+) Arthritis -,   Abdominal Normal abdominal exam  (+)   Peds  Hematology  (+) anemia ,   Anesthesia Other  Findings   Reproductive/Obstetrics                           Anesthesia Physical  Anesthesia Plan  ASA: IV  Anesthesia Plan: General   Post-op Pain Management:    Induction: Intravenous  Airway Management Planned: Oral ETT  Additional Equipment: Arterial line and CVP  Intra-op Plan:   Post-operative Plan: Extubation in OR  Informed Consent: I have reviewed the patients History and Physical, chart, labs and discussed the procedure including the risks, benefits and alternatives for the proposed anesthesia with the patient or authorized representative who has indicated his/her understanding and acceptance.   Dental advisory given  Plan Discussed with: CRNA  Anesthesia Plan Comments:        Anesthesia Quick Evaluation

## 2014-12-13 NOTE — H&P (Signed)
Patient Name: [name] Date of Encounter: 12/13/2014     Principal Problem:   Bacteremia Active Problems:   HTN (hypertension)   History of pulmonary embolism   Type 2 diabetes, uncontrolled, with renal manifestation   CKD (chronic kidney disease), stage IV   Chronic combined systolic and diastolic CHF (congestive heart failure)   Chronic anticoagulation   Atrial fibrillation   Implantable cardioverter-defibrillator-CRT- Mdt   COPD (chronic obstructive pulmonary disease)   Anemia of chronic disease   Fever   SOB (shortness of breath)   ICD (implantable cardioverter-defibrillator) infection   Enterococcal bacteremia   Diabetic feet    SUBJECTIVE  No chest pain or sob. Answered questions from her family.  CURRENT MEDS . amiodarone  200 mg Oral Daily  . ampicillin (OMNIPEN) IV  2 g Intravenous 3 times per day  . antiseptic oral rinse  7 mL Mouth Rinse BID  . carvedilol  9.375 mg Oral BID WC  . cefTRIAXone (ROCEPHIN)  IV  2 g Intravenous Q12H  . colchicine  0.6 mg Oral Daily  . digoxin  0.0625 mg Oral QODAY  . enoxaparin (LOVENOX) injection  30 mg Subcutaneous Q24H  . feeding supplement (ENSURE ENLIVE)  237 mL Oral BID BM  . furosemide  60 mg Oral BID  . glimepiride  2 mg Oral Daily  . insulin aspart  0-15 Units Subcutaneous TID WC  . insulin aspart  0-5 Units Subcutaneous QHS  . isosorbide mononitrate  30 mg Oral Daily  . levothyroxine  25 mcg Oral QAC breakfast  . metoCLOPramide  5 mg Oral QID  . pravastatin  80 mg Oral QHS  . sodium chloride  3 mL Intravenous Q12H    OBJECTIVE  Filed Vitals:   12/12/14 1445 12/12/14 2123 12/13/14 0528 12/13/14 0537  BP: 121/61 140/62 150/78   Pulse: 71 71 72   Temp: 98.6 F (37 C) 98.4 F (36.9 C) 98.3 F (36.8 C)   TempSrc: Oral Oral Oral   Resp: 20 20 18    Height:      Weight:    147 lb 1.6 oz (66.724 kg)  SpO2: 98% 98% 94%     Intake/Output Summary (Last 24 hours) at 12/13/14 0716 Last data filed at  12/13/14 0600  Gross per 24 hour  Intake   1570 ml  Output    225 ml  Net   1345 ml   Filed Weights   12/10/14 0605 12/12/14 0551 12/13/14 0537  Weight: 149 lb 7.6 oz (67.8 kg) 152 lb 1.6 oz (68.992 kg) 147 lb 1.6 oz (66.724 kg)    PHYSICAL EXAM  General: Pleasant, NAD. Neuro: Alert and oriented X 3. Moves all extremities spontaneously. Psych: Normal affect. HEENT:  Normal  Neck: Supple without bruits or JVD. Lungs:  Resp regular and unlabored, CTA. Heart: RRR no s3, s4, or murmurs. Abdomen: Soft, non-tender, non-distended, BS + x 4.  Extremities: No clubbing, cyanosis or edema. DP/PT/Radials 2+ and equal bilaterally.  Accessory Clinical Findings  CBC No results for input(s): WBC, NEUTROABS, HGB, HCT, MCV, PLT in the last 72 hours. Basic Metabolic Panel  Recent Labs  12/12/14 0530 12/13/14 0512  NA 139 138  K 3.7 4.0  CL 106 102  CO2 25 26  GLUCOSE 81 73  BUN 23* 22*  CREATININE 1.39* 1.49*  CALCIUM 8.8* 9.1   Liver Function Tests No results for input(s): AST, ALT, ALKPHOS, BILITOT, PROT, ALBUMIN in the last 72 hours. No results  for input(s): LIPASE, AMYLASE in the last 72 hours. Cardiac Enzymes No results for input(s): CKTOTAL, CKMB, CKMBINDEX, TROPONINI in the last 72 hours. BNP Invalid input(s): POCBNP D-Dimer No results for input(s): DDIMER in the last 72 hours. Hemoglobin A1C No results for input(s): HGBA1C in the last 72 hours. Fasting Lipid Panel No results for input(s): CHOL, HDL, LDLCALC, TRIG, CHOLHDL, LDLDIRECT in the last 72 hours. Thyroid Function Tests No results for input(s): TSH, T4TOTAL, T3FREE, THYROIDAB in the last 72 hours.  Invalid input(s): FREET3  TELE  AV pacing  Radiology/Studies  Ct Abdomen Pelvis Wo Contrast  12/09/2014   CLINICAL DATA:  Fever of unknown origin. History of chronic renal disease.  EXAM: CT ABDOMEN AND PELVIS WITHOUT CONTRAST  TECHNIQUE: Multidetector CT imaging of the abdomen and pelvis was performed  following the standard protocol without IV contrast.  COMPARISON:  07/06/2013.  FINDINGS: Significant decrease in pericardial fluid with minimal fluid remaining. This measures 7 mm in maximum thickness.  Calcified granuloma in the spleen. Interval mild bilateral perinephric soft tissue stranding and fluid. Interval mid inhomogeneity of the lower pole of the right kidney. Prominent extrarenal pelves are unchanged. No hydronephrosis or urinary tract calculi seen.  Right hip prosthesis with associated streak artifacts. Calcified granulomata in the spleen. Interval minimal perihepatic free peritoneal fluid. There is also small amount of free peritoneal fluid in the pelvis. No significant change in a low density right adrenal mass, measuring 1.5 x 1.3 cm on image number 19 and -4 Hounsfield units in density.  A small cyst in the dome of the liver on the right is unchanged. Poorly distended gallbladder. Unremarkable non contrasted appearance of the pancreas and left adrenal gland.  Multiple colonic diverticula without evidence of diverticulitis. No enlarged lymph nodes. No evidence of appendicitis. Cardiac pacer and AICD leads. Stable scarring at both lung bases. Severe left hip degenerative changes with bony remodeling. Lumbar and lower thoracic spine degenerative changes and mild scoliosis. Atheromatous arterial calcifications.  IMPRESSION: 1. Interval bilateral perinephric soft tissue stranding and edema and inhomogeneity of the lower pole of the right kidney. These findings are suspicious for acute pyelonephritis. 2. Interval small amount of free peritoneal fluid. 3. Colonic diverticulosis without evidence of diverticulitis. 4. Stable right adrenal adenoma. 5. Significantly smaller pericardial effusion.   Electronically Signed   By: Claudie Revering M.D.   On: 12/09/2014 19:18   Dg Chest 2 View  12/06/2014   CLINICAL DATA:  Acute onset of shortness of breath. Initial encounter.  EXAM: CHEST  2 VIEW  COMPARISON:  Chest  radiograph performed 11/30/2014  FINDINGS: The lungs are well-aerated. Trace fluid is suggested along the right minor fissure. Minimal bibasilar atelectasis is noted. There is no evidence of pleural effusion or pneumothorax.  The heart is borderline enlarged. A pacemaker/AICD is noted at the right chest wall, with leads ending at the right atrium, right ventricle and coronary sinus. No acute osseous abnormalities are seen.  IMPRESSION: Trace fluid again suggested along the right minor fissure. Borderline cardiomegaly. Minimal bibasilar atelectasis noted.   Electronically Signed   By: Garald Balding M.D.   On: 12/06/2014 23:03   Dg Chest 2 View  11/30/2014   CLINICAL DATA:  Shortness of breath.  EXAM: CHEST  2 VIEW  COMPARISON:  10/17/2014.  01/10/2014.  FINDINGS: Mediastinum hilar structures are normal. Cardiac pacer with lead tips in right atrium right ventricle. Cardiomegaly. Mild bilateral interstitial prominence with Kerley B-lines noted. These findings are most consistent mild congestive heart  failure. Tiny left pleural effusion cannot be excluded. No pneumothorax. Degenerative changes both shoulders.  IMPRESSION: Findings consistent with mild congestive heart failure with pulmonary interstitial edema and tiny left pleural effusion. Cardiac pacer noted in stable position.   Electronically Signed   By: Marcello Moores  Register   On: 11/30/2014 08:42   US Renal  11/30/2014   CLINICAL DATA:  Chronic kidney disease stage 4  EXAM: RENAL / URINARY TRACT ULTRASOUND COMPLETE  COMPARISON:  01/17/2014  FINDINGS: Right Kidney:  Length: 9.6 cm. Extrarenal pelvis on the right similar to prior study. 12 mm cyst midpole. Increased renal echogenicity.  Left Kidney:  Length: 11.1 cm. 12 mm upper pole cyst. Increased echogenicity of the renal cortex.  Bladder:  Appears normal for degree of bladder distention.  IMPRESSION: Medical renal disease.   Electronically Signed   By: Skipper Cliche M.D.   On: 11/30/2014 17:22     ASSESSMENT AND PLAN  1. Enterococcal endocarditis 2. S/p BiV ICD 3. Chronic systolic heart failure 4. Atrial fib Rec: ok for procedure today. I have answered all questions from her family.   Gregg Taylor,M.D.  12/13/2014 7:16 AM

## 2014-12-13 NOTE — Transfer of Care (Signed)
Immediate Anesthesia Transfer of Care Note  Patient: [name]  Procedure(s) Performed: Procedure(s) with comments: ICD LEAD REMOVAL/EXTRACTION  (N/A) - Bartle back up  Patient Location: PACU  Anesthesia Type:General  Level of Consciousness: awake, alert , oriented and patient cooperative  Airway & Oxygen Therapy: Patient Spontanous Breathing and Patient connected to nasal cannula oxygen  Post-op Assessment: Report given to RN, Post -op Vital signs reviewed and stable and Patient moving all extremities  Post vital signs: Reviewed and stable  Last Vitals:  Filed Vitals:   12/13/14 0528  BP: 150/78  Pulse: 72  Temp: 36.8 C  Resp: 18    Complications: No apparent anesthesia complications

## 2014-12-14 ENCOUNTER — Encounter (HOSPITAL_COMMUNITY): Payer: Self-pay | Admitting: Internal Medicine

## 2014-12-14 LAB — GLUCOSE, CAPILLARY
Glucose-Capillary: 54 mg/dL — ABNORMAL LOW (ref 65–99)
Glucose-Capillary: 84 mg/dL (ref 65–99)
Glucose-Capillary: 60 mg/dL — ABNORMAL LOW (ref 65–99)
Glucose-Capillary: 72 mg/dL (ref 65–99)
Glucose-Capillary: 67 mg/dL (ref 65–99)
Glucose-Capillary: 60 mg/dL — ABNORMAL LOW (ref 65–99)
Glucose-Capillary: 71 mg/dL (ref 65–99)

## 2014-12-14 LAB — POCT I-STAT 7, (LYTES, BLD GAS, ICA,H+H)
Acid-Base Excess: 1 mmol/L (ref 0.0–2.0)
Bicarbonate: 24.8 mEq/L — ABNORMAL HIGH (ref 20.0–24.0)
Calcium, Ion: 1.26 mmol/L (ref 1.13–1.30)
HCT: 28 % — ABNORMAL LOW (ref 36.0–46.0)
Hemoglobin: 9.5 g/dL — ABNORMAL LOW (ref 12.0–15.0)
O2 SAT: 100 %
PCO2 ART: 35.2 mmHg (ref 35.0–45.0)
PO2 ART: 226 mmHg — AB (ref 80.0–100.0)
Potassium: 3.6 mmol/L (ref 3.5–5.1)
Sodium: 142 mmol/L (ref 135–145)
TCO2: 26 mmol/L (ref 0–100)
pH, Arterial: 7.453 — ABNORMAL HIGH (ref 7.350–7.450)

## 2014-12-14 LAB — CULTURE, BLOOD (ROUTINE X 2)
Culture: NO GROWTH
Culture: NO GROWTH

## 2014-12-14 LAB — PROTIME-INR
INR: 1.58 — AB (ref 0.00–1.49)
Prothrombin Time: 18.9 seconds — ABNORMAL HIGH (ref 11.6–15.2)

## 2014-12-14 MED ORDER — WARFARIN - PHARMACIST DOSING INPATIENT: Freq: Every day | Status: DC

## 2014-12-14 MED ORDER — WARFARIN SODIUM 2 MG PO TABS
2.0000 mg | ORAL_TABLET | Freq: Once | ORAL | Status: AC
  Administered 2014-12-14: 2 mg via ORAL
  Filled 2014-12-14: qty 1

## 2014-12-14 NOTE — Progress Notes (Signed)
Inpatient Diabetes Program Recommendations  AACE/ADA: New Consensus Statement on Inpatient Glycemic Control (2013)  Target Ranges:  Prepandial:   less than 140 mg/dL      Peak postprandial:   less than 180 mg/dL (1-2 hours)      Critically ill patients:  140 - 180 mg/dL   Inpatient Diabetes Program Recommendations Correction (SSI): decrease to sensitive scale Oral Agents: discontinue Amaryl  Thank you  Raoul Pitch BSN, RN,CDE Inpatient Diabetes Coordinator [phone] (team pager)

## 2014-12-14 NOTE — Progress Notes (Signed)
[facility]                                                                              Patient Demographics  [name], is a 69 y.o. female, DOB - [birth year], VVO:[id number]  Admit date - 12/06/2014   Admitting Physician Phillips Grout, MD Outpatient Primary MD for the patient is Robert Bellow, MD  LOS - 7  Chief Complaint  Patient presents with  . Shortness of Breath       Subjective:   Feels okay, denies any complaints. No fever or chills. PICC line to be placed, likely to be discharged in a.m.   Brief HPI  69 yo female h/o chf, ckd, aicd, PE, on coumadin comes in with over one day of fever, chills and today rigors. Pt has been having a mild cough but nothing too impressive. No swelling in legs. No rashes. No chest pain or abdominal pain. No dysuria or change in urinary flow. Some mild sob, no nasal congestion. No n/v/d. No sick contacts. No pain or swelling around her aicd site. Her temp is over 102 in the ED, source unclear. Asked to admit for fever. Pt given rocephin in ED for possible uti. No neck pain, vision changes.  Assessment & Plan   Enterococcal bacteremia: ICD lead endocarditis - per blood culture drawn 11/30/14 during last hospitalization, Blood cultures drawn 12/06/14 showed enterococcus sensitive to ampicillin -Patient was transferred [facility], TEE done which showed moderate to severe MR, severe LV dysfunction, soft tissue attached to the pacemaker lead with mobile portion on the distal end consistent with vegetation -Status post removal of ICD with its infected leads done by Dr. Lovena Le on 12/13/14 -Seen by ID and recommended Rocephin and ampicillin for total of 6 weeks. -Likely for discharge in a.m. after placement of PICC line. Home health services to be set up  Chronic systolic and diastolic HF - Echo 07/6946 with EF 15% and grade 2 diastolic dysfunction, remains compensated. - On amiodarone, carvedilol, imdur. -  Cardiology following, continue Lasix, I increased it to 60 mg twice a day, 5.4 L positive, weight is still trending up - Continue Coreg, digoxin, Lasix, Imdur, statin, not on ACEI/ARB due to renal insufficiency  COPD: remains stable at baseline   Atrial fibrillation:  -  rate controlled, continue amiodarone, digoxin  - On Coumadin per pharmacy.  - This patients CHA2DS2-VASc Score and unadjusted Ischemic Stroke Rate (% per year) is equal to 9.7 % stroke rate/year from a score of 6 for CHF, HTN, DM, CAD, age 68 and female. Above score calculated as 1 point each if present [CHF, HTN, DM, Vascular=MI/PAD/Aortic Plaque, Age if 65-74, or Female] Above score calculated as 2 points each if present [Age > 75, or Stroke/TIA/TE]  CKD: stage IV -  baseline creatinine 1.8-2.0 - Creatinine at baseline, however overall fluid balance positive, increase Lasix   HTN:BP currently stable   Diabetes mellitus - Fairly controlled, continue sliding scale insulin    Code Status:  full code  Family Communication: Discussed in detail with the patient, all imaging results, lab results explained to the patient  Disposition Plan: Pending PICC line placement  Time Spent in minutes 25 minutes  Procedure TEE  Consults  Cardiology ID  DVT Prophylaxis  warfarin  Medications  Scheduled Meds: . amiodarone  200 mg Oral Daily  . ampicillin (OMNIPEN) IV  2 g Intravenous 3 times per day  . antiseptic oral rinse  7 mL Mouth Rinse BID  . carvedilol  9.375 mg Oral BID WC  . cefTRIAXone (ROCEPHIN)  IV  2 g Intravenous Q12H  . colchicine  0.6 mg Oral Daily  . digoxin  0.0625 mg Oral QODAY  . enoxaparin (LOVENOX) injection  30 mg Subcutaneous Q24H  . feeding supplement (ENSURE ENLIVE)  237 mL Oral BID BM  . furosemide  60 mg Oral BID  . glimepiride  2 mg Oral Daily  . insulin aspart  0-15 Units Subcutaneous TID WC  . insulin aspart  0-5 Units Subcutaneous QHS  . isosorbide mononitrate  30 mg Oral Daily    . levothyroxine  25 mcg Oral QAC breakfast  . metoCLOPramide  5 mg Oral QID  . pravastatin  80 mg Oral QHS  . sodium chloride  3 mL Intravenous Q12H  . warfarin  2 mg Oral ONCE-1800  . Warfarin - Pharmacist Dosing Inpatient   Does not apply q1800   Continuous Infusions:  PRN Meds:.sodium chloride, [DISCONTINUED] acetaminophen **OR** acetaminophen, acetaminophen, ondansetron **OR** ondansetron (ZOFRAN) IV, ondansetron, oxyCODONE-acetaminophen, polyethylene glycol, sodium chloride   Antibiotics   Anti-infectives    Start     Dose/Rate Route Frequency Ordered Stop   12/13/14 0830  gentamicin (GARAMYCIN) 80 mg in sodium chloride irrigation 0.9 % 500 mL irrigation      Irrigation Once 12/13/14 0829 12/13/14 0940   12/11/14 2000  ampicillin (OMNIPEN) 2 g in sodium chloride 0.9 % 50 mL IVPB     2 g 150 mL/hr over 20 Minutes Intravenous 3 times per day 12/11/14 1048     12/11/14 1200  cefTRIAXone (ROCEPHIN) 2 g in dextrose 5 % 50 mL IVPB - Premix     2 g 100 mL/hr over 30 Minutes Intravenous Every 12 hours 12/11/14 1048     12/11/14 1000  ampicillin (OMNIPEN) 2 g in sodium chloride 0.9 % 50 mL IVPB  Status:  Discontinued     2 g 150 mL/hr over 20 Minutes Intravenous 4 times per day 12/11/14 0840 12/11/14 1048   12/10/14 0600  Ampicillin-Sulbactam (UNASYN) 3 g in sodium chloride 0.9 % 100 mL IVPB  Status:  Discontinued     3 g 100 mL/hr over 60 Minutes Intravenous Every 12 hours 12/09/14 1831 12/11/14 0840   12/09/14 1845  Ampicillin-Sulbactam (UNASYN) 3 g in sodium chloride 0.9 % 100 mL IVPB     3 g 100 mL/hr over 60 Minutes Intravenous  Once 12/09/14 1831 12/09/14 2148   12/09/14 0600  levofloxacin (LEVAQUIN) IVPB 750 mg  Status:  Discontinued     750 mg 100 mL/hr over 90 Minutes Intravenous Every 48 hours 12/07/14 1124 12/08/14 1210   12/08/14 1400  ampicillin (OMNIPEN) 2 g in sodium chloride 0.9 % 50 mL IVPB  Status:  Discontinued     2 g 150 mL/hr over 20 Minutes Intravenous 3  times per day 12/08/14 1222 12/09/14 1037   12/08/14 1000  vancomycin (VANCOCIN) IVPB 750 mg/150 ml premix  Status:  Discontinued     750 mg 150 mL/hr over 60 Minutes Intravenous Every 24 hours 12/07/14 1115 12/11/14 0840   12/07/14 1030  vancomycin (VANCOCIN)  1,500 mg in sodium chloride 0.9 % 500 mL IVPB     1,500 mg 250 mL/hr over 120 Minutes Intravenous  Once 12/07/14 0920 12/07/14 1359   12/07/14 0100  levofloxacin (LEVAQUIN) IVPB 750 mg     750 mg 100 mL/hr over 90 Minutes Intravenous  Once 12/07/14 0053 12/07/14 0301   12/06/14 2300  cefTRIAXone (ROCEPHIN) 1 g in dextrose 5 % 50 mL IVPB     1 g 100 mL/hr over 30 Minutes Intravenous  Once 12/06/14 2254 12/06/14 2338         Objective:   Blood pressure 117/45, pulse 67, temperature 98 F (36.7 C), temperature source Oral, resp. rate 18, height 5\' 4"  (1.626 m), weight 65.8 kg (145 lb 1 oz), SpO2 98 %.  Wt Readings from Last 3 Encounters:  12/14/14 65.8 kg (145 lb 1 oz)  12/04/14 67.699 kg (149 lb 4 oz)  12/02/14 69.1 kg (152 lb 5.4 oz)     Intake/Output Summary (Last 24 hours) at 12/14/14 1313 Last data filed at 12/14/14 1300  Gross per 24 hour  Intake    820 ml  Output   1400 ml  Net   -580 ml    Exam  General: Alert and oriented x 3, NAD  HEENT:  PERRLA, EOMI  Neck: Supple  CVS: S1 S2 auscultated, 2/6 SM , AICD in the right chest wall  Respiratory: Clear to auscultation bilaterally  Abdomen: Soft, nontender, nondistended, normal bowel sounds  Ext: no cyanosis clubbing or edema  Neuro: no new deficits  Skin: No rashes  Psych: Normal affect and demeanor, alert and oriented x3    Data Review   Micro Results Recent Results (from the past 240 hour(s))  Blood culture (routine x 2)     Status: None   Collection Time: 12/06/14 10:00 PM  Result Value Ref Range Status   Specimen Description BLOOD RIGHT ARM  Final   Special Requests BOTTLES DRAWN AEROBIC AND ANAEROBIC 10CC EACH  Final   Culture  Setup  Time   Final    GRAM POSITIVE COCCI IN PAIRS AND CHAINS Gram Stain Report Called to,Read Back By and Verified With: JAMES,T. AT 1610 ON 12/07/2014 BY BAUGHAM,M Performed at [facility]    Culture   Final    ENTEROCOCCUS SPECIES Performed at [facility]    Report Status 12/09/2014 FINAL  Final   Organism ID, Bacteria ENTEROCOCCUS SPECIES  Final      Susceptibility   Enterococcus species - MIC*    AMPICILLIN <=2 SENSITIVE Sensitive     VANCOMYCIN 1 SENSITIVE Sensitive     GENTAMICIN SYNERGY SENSITIVE Sensitive     LINEZOLID 2 SENSITIVE Sensitive     * ENTEROCOCCUS SPECIES  Blood culture (routine x 2)     Status: None   Collection Time: 12/06/14 10:06 PM  Result Value Ref Range Status   Specimen Description BLOOD RIGHT HAND  Final   Special Requests BOTTLES DRAWN AEROBIC AND ANAEROBIC 10CC EACH  Final   Culture  Setup Time   Final    GRAM POSITIVE COCCI IN PAIRS AND CHAINS Gram Stain Report Called to,Read Back By and Verified With: JAMES,T. AT 1807 ON 12/07/2014 BY BAUGHAM,M. Performed at [facility]    Culture   Final    ENTEROCOCCUS SPECIES SUSCEPTIBILITIES PERFORMED ON PREVIOUS CULTURE WITHIN THE LAST 5 DAYS. Performed at [facility]    Report Status 12/09/2014 FINAL  Final  Culture, Urine  Status: None   Collection Time: 12/06/14 10:16 PM  Result Value Ref Range Status   Specimen Description URINE, CATHETERIZED  Final   Special Requests NONE  Final   Culture   Final    NO GROWTH 2 DAYS Performed at [facility]    Report Status 12/09/2014 FINAL  Final  MRSA PCR Screening     Status: None   Collection Time: 12/07/14 12:25 AM  Result Value Ref Range Status   MRSA by PCR NEGATIVE NEGATIVE Final    Comment:        The GeneXpert MRSA Assay (FDA approved for NASAL specimens only), is one component of a comprehensive MRSA colonization surveillance program. It is not intended to diagnose MRSA infection nor to guide or monitor  treatment for MRSA infections.   Culture, blood (routine x 2)     Status: None   Collection Time: 12/08/14 12:57 PM  Result Value Ref Range Status   Specimen Description BLOOD LEFT HAND  Final   Special Requests   Final    Immunocompromised BOTTLES DRAWN AEROBIC AND ANAEROBIC 6 CC EACH BOTTLE   Culture NO GROWTH 5 DAYS  Final   Report Status 12/14/2014 FINAL  Final  Culture, blood (routine x 2)     Status: None   Collection Time: 12/08/14  1:00 PM  Result Value Ref Range Status   Specimen Description BLOOD RIGHT HAND  Final   Special Requests   Final    Immunocompromised BOTTLES DRAWN AEROBIC AND ANAEROBIC 6 CC EACH BOTTLE   Culture NO GROWTH 5 DAYS  Final   Report Status 12/14/2014 FINAL  Final  Clostridium Difficile by PCR (not at [facility])     Status: None   Collection Time: 12/10/14  9:27 AM  Result Value Ref Range Status   C difficile by pcr NEGATIVE NEGATIVE Final    Radiology Reports Ct Abdomen Pelvis Wo Contrast  12/09/2014   CLINICAL DATA:  Fever of unknown origin. History of chronic renal disease.  EXAM: CT ABDOMEN AND PELVIS WITHOUT CONTRAST  TECHNIQUE: Multidetector CT imaging of the abdomen and pelvis was performed following the standard protocol without IV contrast.  COMPARISON:  07/06/2013.  FINDINGS: Significant decrease in pericardial fluid with minimal fluid remaining. This measures 7 mm in maximum thickness.  Calcified granuloma in the spleen. Interval mild bilateral perinephric soft tissue stranding and fluid. Interval mid inhomogeneity of the lower pole of the right kidney. Prominent extrarenal pelves are unchanged. No hydronephrosis or urinary tract calculi seen.  Right hip prosthesis with associated streak artifacts. Calcified granulomata in the spleen. Interval minimal perihepatic free peritoneal fluid. There is also small amount of free peritoneal fluid in the pelvis. No significant change in a low density right adrenal mass, measuring 1.5 x 1.3 cm on image number 19  and -4 Hounsfield units in density.  A small cyst in the dome of the liver on the right is unchanged. Poorly distended gallbladder. Unremarkable non contrasted appearance of the pancreas and left adrenal gland.  Multiple colonic diverticula without evidence of diverticulitis. No enlarged lymph nodes. No evidence of appendicitis. Cardiac pacer and AICD leads. Stable scarring at both lung bases. Severe left hip degenerative changes with bony remodeling. Lumbar and lower thoracic spine degenerative changes and mild scoliosis. Atheromatous arterial calcifications.  IMPRESSION: 1. Interval bilateral perinephric soft tissue stranding and edema and inhomogeneity of the lower pole of the right kidney. These findings are suspicious for acute pyelonephritis. 2. Interval small amount of free peritoneal  fluid. 3. Colonic diverticulosis without evidence of diverticulitis. 4. Stable right adrenal adenoma. 5. Significantly smaller pericardial effusion.   Electronically Signed   By: Claudie Revering M.D.   On: 12/09/2014 19:18   Dg Chest 2 View  12/06/2014   CLINICAL DATA:  Acute onset of shortness of breath. Initial encounter.  EXAM: CHEST  2 VIEW  COMPARISON:  Chest radiograph performed 11/30/2014  FINDINGS: The lungs are well-aerated. Trace fluid is suggested along the right minor fissure. Minimal bibasilar atelectasis is noted. There is no evidence of pleural effusion or pneumothorax.  The heart is borderline enlarged. A pacemaker/AICD is noted at the right chest wall, with leads ending at the right atrium, right ventricle and coronary sinus. No acute osseous abnormalities are seen.  IMPRESSION: Trace fluid again suggested along the right minor fissure. Borderline cardiomegaly. Minimal bibasilar atelectasis noted.   Electronically Signed   By: Garald Balding M.D.   On: 12/06/2014 23:03   Dg Chest 2 View  11/30/2014   CLINICAL DATA:  Shortness of breath.  EXAM: CHEST  2 VIEW  COMPARISON:  10/17/2014.  01/10/2014.  FINDINGS:  Mediastinum hilar structures are normal. Cardiac pacer with lead tips in right atrium right ventricle. Cardiomegaly. Mild bilateral interstitial prominence with Kerley B-lines noted. These findings are most consistent mild congestive heart failure. Tiny left pleural effusion cannot be excluded. No pneumothorax. Degenerative changes both shoulders.  IMPRESSION: Findings consistent with mild congestive heart failure with pulmonary interstitial edema and tiny left pleural effusion. Cardiac pacer noted in stable position.   Electronically Signed   By: Marcello Moores  Register   On: 11/30/2014 08:42   US Renal  11/30/2014   CLINICAL DATA:  Chronic kidney disease stage 4  EXAM: RENAL / URINARY TRACT ULTRASOUND COMPLETE  COMPARISON:  01/17/2014  FINDINGS: Right Kidney:  Length: 9.6 cm. Extrarenal pelvis on the right similar to prior study. 12 mm cyst midpole. Increased renal echogenicity.  Left Kidney:  Length: 11.1 cm. 12 mm upper pole cyst. Increased echogenicity of the renal cortex.  Bladder:  Appears normal for degree of bladder distention.  IMPRESSION: Medical renal disease.   Electronically Signed   By: Skipper Cliche M.D.   On: 11/30/2014 17:22    CBC  Recent Labs Lab 12/08/14 0538  WBC 6.4  HGB 9.0*  HCT 27.4*  PLT 136*  MCV 95.8  MCH 31.5  MCHC 32.8  RDW 16.2*    Chemistries   Recent Labs Lab 12/08/14 0538 12/11/14 0400 12/12/14 0530 12/13/14 0512  NA 139 137 139 138  K 4.2 3.6 3.7 4.0  CL 104 107 106 102  CO2 27 23 25 26   GLUCOSE 116* 81 81 73  BUN 43* 21* 23* 22*  CREATININE 1.78* 1.58* 1.39* 1.49*  CALCIUM 8.8* 8.5* 8.8* 9.1   ------------------------------------------------------------------------------------------------------------------ estimated creatinine clearance is 32.3 mL/min (by C-G formula based on Cr of 1.49). ------------------------------------------------------------------------------------------------------------------ No results for input(s): HGBA1C in the last  72 hours. ------------------------------------------------------------------------------------------------------------------ No results for input(s): CHOL, HDL, LDLCALC, TRIG, CHOLHDL, LDLDIRECT in the last 72 hours. ------------------------------------------------------------------------------------------------------------------ No results for input(s): TSH, T4TOTAL, T3FREE, THYROIDAB in the last 72 hours.  Invalid input(s): FREET3 ------------------------------------------------------------------------------------------------------------------ No results for input(s): VITAMINB12, FOLATE, FERRITIN, TIBC, IRON, RETICCTPCT in the last 72 hours.  Coagulation profile  Recent Labs Lab 12/10/14 0514 12/11/14 0400 12/12/14 0530 12/13/14 0512 12/14/14 0344  INR 1.25 1.52* 1.54* 1.42 1.58*    No results for input(s): DDIMER in the last 72 hours.  Cardiac Enzymes No results  for input(s): CKMB, TROPONINI, MYOGLOBIN in the last 168 hours.  Invalid input(s): CK ------------------------------------------------------------------------------------------------------------------ Invalid input(s): [place]  12/13/14 1612 12/13/14 2056 12/14/14 0622 12/14/14 0659 12/14/14 1229 12/14/14 1249  GLUCAP 90 96 54* 84 60* 72     Ahna Konkle A M.D. [facility] 12/14/2014, 1:13 PM  Pager: [phone]  Between 7am to 7pm - call Pager - [phone]  After 7pm go to [email or web address] - password [email or web address]  Call night coverage person covering after 7pm

## 2014-12-14 NOTE — Progress Notes (Signed)
Patient ID: HAYES REHFELDT, female   DOB: June 09, 1946, 69 y.o.   MRN: [id number]    Patient Name: [name] Date of Encounter: 12/14/2014     Principal Problem:   Bacteremia Active Problems:   HTN (hypertension)   History of pulmonary embolism   Type 2 diabetes, uncontrolled, with renal manifestation   CKD (chronic kidney disease), stage IV   Chronic combined systolic and diastolic CHF (congestive heart failure)   Chronic anticoagulation   Atrial fibrillation   Implantable cardioverter-defibrillator-CRT- Mdt   COPD (chronic obstructive pulmonary disease)   Anemia of chronic disease   Fever   SOB (shortness of breath)   ICD (implantable cardioverter-defibrillator) infection   Enterococcal bacteremia   Diabetic feet    SUBJECTIVE  S/p lead extraction with minimal incisional soreness  CURRENT MEDS . amiodarone  200 mg Oral Daily  . ampicillin (OMNIPEN) IV  2 g Intravenous 3 times per day  . antiseptic oral rinse  7 mL Mouth Rinse BID  . carvedilol  9.375 mg Oral BID WC  . cefTRIAXone (ROCEPHIN)  IV  2 g Intravenous Q12H  . colchicine  0.6 mg Oral Daily  . digoxin  0.0625 mg Oral QODAY  . enoxaparin (LOVENOX) injection  30 mg Subcutaneous Q24H  . feeding supplement (ENSURE ENLIVE)  237 mL Oral BID BM  . furosemide  60 mg Oral BID  . glimepiride  2 mg Oral Daily  . insulin aspart  0-15 Units Subcutaneous TID WC  . insulin aspart  0-5 Units Subcutaneous QHS  . isosorbide mononitrate  30 mg Oral Daily  . levothyroxine  25 mcg Oral QAC breakfast  . metoCLOPramide  5 mg Oral QID  . pravastatin  80 mg Oral QHS  . sodium chloride  3 mL Intravenous Q12H    OBJECTIVE  Filed Vitals:   12/13/14 1100 12/13/14 1200 12/13/14 2022 12/14/14 0532  BP: 133/62 122/58 108/43 117/45  Pulse: 68 66 76 67  Temp: 98.2 F (36.8 C) 98.8 F (37.1 C) 99.7 F (37.6 C) 98 F (36.7 C)  TempSrc:   Oral Oral  Resp:  18 18 18   Height:      Weight:    145 lb 1 oz (65.8 kg)  SpO2: 100% 97%  95% 98%    Intake/Output Summary (Last 24 hours) at 12/14/14 0832 Last data filed at 12/14/14 0805  Gross per 24 hour  Intake   1420 ml  Output   1400 ml  Net     20 ml   Filed Weights   12/12/14 0551 12/13/14 0537 12/14/14 0532  Weight: 152 lb 1.6 oz (68.992 kg) 147 lb 1.6 oz (66.724 kg) 145 lb 1 oz (65.8 kg)    PHYSICAL EXAM  General: Pleasant, NAD. Neuro: Alert and oriented X 3. Moves all extremities spontaneously. Psych: Normal affect. HEENT:  Normal  Neck: Supple without bruits or JVD. Lungs:  Resp regular and unlabored, CTA. Heart: RRR no s3, s4, or murmurs. Abdomen: Soft, non-tender, non-distended, BS + x 4.  Extremities: No clubbing, cyanosis or edema. DP/PT/Radials 2+ and equal bilaterally.  Accessory Clinical Findings  CBC No results for input(s): WBC, NEUTROABS, HGB, HCT, MCV, PLT in the last 72 hours. Basic Metabolic Panel  Recent Labs  12/12/14 0530 12/13/14 0512  NA 139 138  K 3.7 4.0  CL 106 102  CO2 25 26  GLUCOSE 81 73  BUN 23* 22*  CREATININE 1.39* 1.49*  CALCIUM 8.8* 9.1   Liver Function Tests  No results for input(s): AST, ALT, ALKPHOS, BILITOT, PROT, ALBUMIN in the last 72 hours. No results for input(s): LIPASE, AMYLASE in the last 72 hours. Cardiac Enzymes No results for input(s): CKTOTAL, CKMB, CKMBINDEX, TROPONINI in the last 72 hours. BNP Invalid input(s): POCBNP D-Dimer No results for input(s): DDIMER in the last 72 hours. Hemoglobin A1C No results for input(s): HGBA1C in the last 72 hours. Fasting Lipid Panel No results for input(s): CHOL, HDL, LDLCALC, TRIG, CHOLHDL, LDLDIRECT in the last 72 hours. Thyroid Function Tests No results for input(s): TSH, T4TOTAL, T3FREE, THYROIDAB in the last 72 hours.  Invalid input(s): FREET3  TELE  nsr  Radiology/Studies  Ct Abdomen Pelvis Wo Contrast  12/09/2014   CLINICAL DATA:  Fever of unknown origin. History of chronic renal disease.  EXAM: CT ABDOMEN AND PELVIS WITHOUT CONTRAST   TECHNIQUE: Multidetector CT imaging of the abdomen and pelvis was performed following the standard protocol without IV contrast.  COMPARISON:  07/06/2013.  FINDINGS: Significant decrease in pericardial fluid with minimal fluid remaining. This measures 7 mm in maximum thickness.  Calcified granuloma in the spleen. Interval mild bilateral perinephric soft tissue stranding and fluid. Interval mid inhomogeneity of the lower pole of the right kidney. Prominent extrarenal pelves are unchanged. No hydronephrosis or urinary tract calculi seen.  Right hip prosthesis with associated streak artifacts. Calcified granulomata in the spleen. Interval minimal perihepatic free peritoneal fluid. There is also small amount of free peritoneal fluid in the pelvis. No significant change in a low density right adrenal mass, measuring 1.5 x 1.3 cm on image number 19 and -4 Hounsfield units in density.  A small cyst in the dome of the liver on the right is unchanged. Poorly distended gallbladder. Unremarkable non contrasted appearance of the pancreas and left adrenal gland.  Multiple colonic diverticula without evidence of diverticulitis. No enlarged lymph nodes. No evidence of appendicitis. Cardiac pacer and AICD leads. Stable scarring at both lung bases. Severe left hip degenerative changes with bony remodeling. Lumbar and lower thoracic spine degenerative changes and mild scoliosis. Atheromatous arterial calcifications.  IMPRESSION: 1. Interval bilateral perinephric soft tissue stranding and edema and inhomogeneity of the lower pole of the right kidney. These findings are suspicious for acute pyelonephritis. 2. Interval small amount of free peritoneal fluid. 3. Colonic diverticulosis without evidence of diverticulitis. 4. Stable right adrenal adenoma. 5. Significantly smaller pericardial effusion.   Electronically Signed   By: Claudie Revering M.D.   On: 12/09/2014 19:18   Dg Chest 2 View  12/06/2014   CLINICAL DATA:  Acute onset of  shortness of breath. Initial encounter.  EXAM: CHEST  2 VIEW  COMPARISON:  Chest radiograph performed 11/30/2014  FINDINGS: The lungs are well-aerated. Trace fluid is suggested along the right minor fissure. Minimal bibasilar atelectasis is noted. There is no evidence of pleural effusion or pneumothorax.  The heart is borderline enlarged. A pacemaker/AICD is noted at the right chest wall, with leads ending at the right atrium, right ventricle and coronary sinus. No acute osseous abnormalities are seen.  IMPRESSION: Trace fluid again suggested along the right minor fissure. Borderline cardiomegaly. Minimal bibasilar atelectasis noted.   Electronically Signed   By: Garald Balding M.D.   On: 12/06/2014 23:03   Dg Chest 2 View  11/30/2014   CLINICAL DATA:  Shortness of breath.  EXAM: CHEST  2 VIEW  COMPARISON:  10/17/2014.  01/10/2014.  FINDINGS: Mediastinum hilar structures are normal. Cardiac pacer with lead tips in right atrium right ventricle. Cardiomegaly.  Mild bilateral interstitial prominence with Kerley B-lines noted. These findings are most consistent mild congestive heart failure. Tiny left pleural effusion cannot be excluded. No pneumothorax. Degenerative changes both shoulders.  IMPRESSION: Findings consistent with mild congestive heart failure with pulmonary interstitial edema and tiny left pleural effusion. Cardiac pacer noted in stable position.   Electronically Signed   By: Marcello Moores  Register   On: 11/30/2014 08:42   US Renal  11/30/2014   CLINICAL DATA:  Chronic kidney disease stage 4  EXAM: RENAL / URINARY TRACT ULTRASOUND COMPLETE  COMPARISON:  01/17/2014  FINDINGS: Right Kidney:  Length: 9.6 cm. Extrarenal pelvis on the right similar to prior study. 12 mm cyst midpole. Increased renal echogenicity.  Left Kidney:  Length: 11.1 cm. 12 mm upper pole cyst. Increased echogenicity of the renal cortex.  Bladder:  Appears normal for degree of bladder distention.  IMPRESSION: Medical renal disease.    Electronically Signed   By: Skipper Cliche M.D.   On: 11/30/2014 17:22    ASSESSMENT AND PLAN  1. ICD system infection 2. Enterococcal endocarditis 3. S/p ICD system extraction Rec: ok to discharge from our perspective once we can obtain a PICC line and get anti-biotics arranged as an outpatient. She will not be a candidate for additional ICD therapy as she has gotten infections twice, long after device implant, presumably due to transient bacteremia. Continue all home meds.    Haruki Arnold,M.D.  12/14/2014 8:32 AM

## 2014-12-14 NOTE — Progress Notes (Signed)
ANTICOAGULATION CONSULT NOTE - Initial Consult  Pharmacy Consult:  Coumadin Indication:  History of PE  No Known Allergies  Patient Measurements: Height: 5\' 4"  (162.6 cm) Weight: 145 lb 1 oz (65.8 kg) IBW/kg (Calculated) : 54.7  Vital Signs: Temp: 98 F (36.7 C) (06/23 0532) Temp Source: Oral (06/23 0532) BP: 117/45 mmHg (06/23 0532) Pulse Rate: 67 (06/23 0532)  Labs:  Recent Labs  12/12/14 0530 12/13/14 0512 12/14/14 0344  LABPROT 18.5* 17.5* 18.9*  INR 1.54* 1.42 1.58*  CREATININE 1.39* 1.49*  --     Estimated Creatinine Clearance: 32.3 mL/min (by C-G formula based on Cr of 1.49).   Medical History: Past Medical History  Diagnosis Date  . Cardiomyopathy, nonischemic     a. 1999 nl cath;  b. 12/05 [facility] [place];  c. 10/2005 ICD extraction 2/2 enterococcus bacteremia and Veg on RV lead;  c. 05/2008 low risk Myoview (scarring w/ some evidence of inf ischemia);  d. 11/2012 Echo: EF 15-20%;  e. 01/2013 s/p MDT [place] CRT D, ser # [id number];  f. 05/2013 Echo: EF 15%.  . Enterococcal infection     a. 10/2005 - AICD-explanted  . Pulmonary embolism     a. 07/2005 after total right hip arthroplasty  . Hilar density     a. infrahilar mass/adenopathy on CT scan 5/07; subsequently  resolved  . LBBB (left bundle branch block)   . GERD (gastroesophageal reflux disease)   . Hyperlipidemia   . Hypertension   . Tobacco abuse     a. discontinued in 1997, and then resumed  . Urinary incontinence   . Anemia     a. mild/chronic  . Villous adenoma of colon     a. tubovillous adenomatous polyp with focal high grade dysplasia; presented with hematochezia - followed by Dr. Laural Golden.  . Implantable cardioverter-defibrillator-CRT- Mdt     a.  01/2013 s/p MDT [place] CRT D, ser # [id number]  . Chronic systolic CHF (congestive heart failure)     a. 11/2012 Echo: EF 15-20%;  b. 05/2013 TEE EF 15%.  . Pneumonia 07/2005  . Obstructive sleep apnea     a. mild-did not tolerate CPAP (01/24/2013)   . History of blood transfusion   . Degenerative joint disease     of knees, shoulder, and hips  . PAF (paroxysmal atrial fibrillation)     a. 07/2012 s/p TEE/DCCV;  b. chronic coumadin;  c. 05/2013 Recurrent Afib->TEE/DCCV and amio initiation.  . CKD (chronic kidney disease), stage III     creatinin-1.44 in 1/09; 1.51 in 1/10  . Type II diabetes mellitus     type 2       Assessment: [name] on Coumadin PTA for history of PE.  She presented to the ED with chills and fever, and found to have Enterococcal ICD endocarditis.  Coumadin was held for ICD extraction and she was placed on prophylactic Lovenox.  Now s/p ICD extraction on 12/13/11 and Coumadin to resume today.  INR currently at 1.58; no bleeding reported.   Goal of Therapy:  INR 2-3    Plan:  - Coumadin 2mg  PO today - Continue Lovenox until INR therapeutic - Daily PT / INR    Oakley Kossman D. Mina Marble, PharmD, BCPS Pager:  [phone] 12/14/2014, 9:23 AM

## 2014-12-14 NOTE — Care Management Note (Signed)
Case Management Note  Patient Details  Name: [name] MRN: [id number] Date of Birth: [birth year]  Subjective/Objective:  Admitted with Bacteremia                  Action/Plan: Patient is to return home with spouse and [facility] for home IV antibiotics. Pam with [facility] is aware and has talked to the patient and spouse;  Expected Discharge Date:  12/16/2014               Expected Discharge Plan:  [place]  In-House Referral:  NA  Discharge planning Services  CM Consult  Post Acute Care Choice:  NA Choice offered to:  NA, Patient  HH Arranged:  RN [place] Agency:  [place]  Status of Service:  Completed, signed off  Medicare Important Message Given:  Yes Date Medicare IM Given:  12/14/14 Medicare IM give by:  Mindi Slicker RN   Royston Bake, RN,BSN,MHA [phone] 12/14/2014, 10:08 AM

## 2014-12-14 NOTE — Progress Notes (Signed)
Peripherally Inserted Central Catheter/Midline Placement  The IV Nurse has discussed with the patient and/or persons authorized to consent for the patient, the purpose of this procedure and the potential benefits and risks involved with this procedure.  The benefits include less needle sticks, lab draws from the catheter and patient may be discharged home with the catheter.  Risks include, but not limited to, infection, bleeding, blood clot (thrombus formation), and puncture of an artery; nerve damage and irregular heat beat.  Alternatives to this procedure were also discussed.  PICC/Midline Placement Documentation        [name] 12/14/2014, 12:31 PM

## 2014-12-14 NOTE — Progress Notes (Signed)
Attempted PICC insertion via left basilic vein without success.   She had an AICD on left side in the past that had to be removed.   I had no difficulty accessing the vein however was unable to thread beyond the shoulder/subclavian area.  Primary RN made aware.  I also called Interventional Radiology and spoke with [facility].   Gave her information.   "They may get to her today but might be tomorrow".   Pt for discharge tomorrow.  She has PIV access.

## 2014-12-15 ENCOUNTER — Inpatient Hospital Stay (HOSPITAL_COMMUNITY): Payer: Commercial Managed Care - HMO

## 2014-12-15 DIAGNOSIS — R509 Fever, unspecified: Secondary | ICD-10-CM

## 2014-12-15 LAB — BASIC METABOLIC PANEL
ANION GAP: 9 (ref 5–15)
BUN: 18 mg/dL (ref 6–20)
CO2: 26 mmol/L (ref 22–32)
CREATININE: 1.65 mg/dL — AB (ref 0.44–1.00)
Calcium: 8.5 mg/dL — ABNORMAL LOW (ref 8.9–10.3)
Chloride: 99 mmol/L — ABNORMAL LOW (ref 101–111)
GFR calc Af Amer: 35 mL/min — ABNORMAL LOW (ref >60)
GFR calc non Af Amer: 30 mL/min — ABNORMAL LOW (ref >60)
Glucose, Bld: 126 mg/dL — ABNORMAL HIGH (ref 65–99)
Potassium: 3.5 mmol/L (ref 3.5–5.1)
Sodium: 134 mmol/L — ABNORMAL LOW (ref 135–145)

## 2014-12-15 LAB — CBC
HEMATOCRIT: 27.7 % — AB (ref 36.0–46.0)
HEMOGLOBIN: 9 g/dL — AB (ref 12.0–15.0)
MCH: 30.2 pg (ref 26.0–34.0)
MCHC: 32.5 g/dL (ref 30.0–36.0)
MCV: 93 fL (ref 78.0–100.0)
Platelets: 188 10*3/uL (ref 150–400)
RBC: 2.98 MIL/uL — ABNORMAL LOW (ref 3.87–5.11)
RDW: 15.8 % — ABNORMAL HIGH (ref 11.5–15.5)
WBC: 7 10*3/uL (ref 4.0–10.5)

## 2014-12-15 LAB — GLUCOSE, CAPILLARY
GLUCOSE-CAPILLARY: 193 mg/dL — AB (ref 65–99)
Glucose-Capillary: 100 mg/dL — ABNORMAL HIGH (ref 65–99)
Glucose-Capillary: 186 mg/dL — ABNORMAL HIGH (ref 65–99)

## 2014-12-15 LAB — PROTIME-INR
INR: 1.57 — ABNORMAL HIGH (ref 0.00–1.49)
PROTHROMBIN TIME: 18.8 s — AB (ref 11.6–15.2)

## 2014-12-15 MED ORDER — IOHEXOL 300 MG/ML  SOLN
50.0000 mL | Freq: Once | INTRAMUSCULAR | Status: AC | PRN
  Administered 2014-12-15: 10 mL via INTRAVENOUS

## 2014-12-15 MED ORDER — WARFARIN SODIUM 2 MG PO TABS
2.0000 mg | ORAL_TABLET | Freq: Once | ORAL | Status: AC
  Administered 2014-12-15: 2 mg via ORAL
  Filled 2014-12-15: qty 1

## 2014-12-15 MED ORDER — SODIUM CHLORIDE 0.9 % IV SOLN: 3.0000 g | Freq: Three times a day (TID) | INTRAVENOUS | Status: DC

## 2014-12-15 MED ORDER — CEFTRIAXONE SODIUM IN DEXTROSE 40 MG/ML IV SOLN: 2.0000 g | Freq: Two times a day (BID) | INTRAVENOUS | Status: DC

## 2014-12-15 MED ORDER — LIDOCAINE HCL 1 % IJ SOLN
INTRAMUSCULAR | Status: AC
  Filled 2014-12-15: qty 20

## 2014-12-15 NOTE — Discharge Instructions (Signed)
Keep incision clean and dry until seen in office for wound check appointment   Information on my medicine - Coumadin   (Warfarin)  This medication education was reviewed with me or my healthcare representative as part of my discharge preparation.  The pharmacist that spoke with me during my hospital stay was:  Saundra Shelling, [facility]  Why was Coumadin prescribed for you? Coumadin was prescribed for you because you have a blood clot or a medical condition that can cause an increased risk of forming blood clots. Blood clots can cause serious health problems by blocking the flow of blood to the heart, lung, or brain. Coumadin can prevent harmful blood clots from forming. As a reminder your indication for Coumadin is:   Pulmonary Embolism Treatment  What test will check on my response to Coumadin? While on Coumadin (warfarin) you will need to have an INR test regularly to ensure that your dose is keeping you in the desired range. The INR (international normalized ratio) number is calculated from the result of the laboratory test called prothrombin time (PT).  If an INR APPOINTMENT HAS NOT ALREADY BEEN MADE FOR YOU please schedule an appointment to have this lab work done by your health care provider within 7 days. Your INR goal is usually a number between:  2 to 3 or your provider may give you a more narrow range like 2-2.5.  Ask your health care provider during an office visit what your goal INR is.  What  do you need to  know  About  COUMADIN? Take Coumadin (warfarin) exactly as prescribed by your healthcare provider about the same time each day.  DO NOT stop taking without talking to the doctor who prescribed the medication.  Stopping without other blood clot prevention medication to take the place of Coumadin may increase your risk of developing a new clot or stroke.  Get refills before you run out.  What do you do if you miss a dose? If you miss a dose, take it as soon as you remember on the same  day then continue your regularly scheduled regimen the next day.  Do not take two doses of Coumadin at the same time.  Important Safety Information A possible side effect of Coumadin (Warfarin) is an increased risk of bleeding. You should call your healthcare provider right away if you experience any of the following: ? Bleeding from an injury or your nose that does not stop. ? Unusual colored urine (red or dark brown) or unusual colored stools (red or black). ? Unusual bruising for unknown reasons. ? A serious fall or if you hit your head (even if there is no bleeding).  Some foods or medicines interact with Coumadin (warfarin) and might alter your response to warfarin. To help avoid this: ? Eat a balanced diet, maintaining a consistent amount of Vitamin K. ? Notify your provider about major diet changes you plan to make. ? Avoid alcohol or limit your intake to 1 drink for women and 2 drinks for men per day. (1 drink is 5 oz. wine, 12 oz. beer, or 1.5 oz. liquor.)  Make sure that ANY health care provider who prescribes medication for you knows that you are taking Coumadin (warfarin).  Also make sure the healthcare provider who is monitoring your Coumadin knows when you have started a new medication including herbals and non-prescription products.  Coumadin (Warfarin)  Major Drug Interactions  Increased Warfarin Effect Decreased Warfarin Effect  Alcohol (large quantities) Antibiotics (esp. Septra/Bactrim,  Flagyl, Cipro) Amiodarone (Cordarone) Aspirin (ASA) Cimetidine (Tagamet) Megestrol (Megace) NSAIDs (ibuprofen, naproxen, etc.) Piroxicam (Feldene) Propafenone (Rythmol SR) Propranolol (Inderal) Isoniazid (INH) Posaconazole (Noxafil) Barbiturates (Phenobarbital) Carbamazepine (Tegretol) Chlordiazepoxide (Librium) Cholestyramine (Questran) Griseofulvin Oral Contraceptives Rifampin Sucralfate (Carafate) Vitamin K   Coumadin (Warfarin) Major Herbal Interactions  Increased  Warfarin Effect Decreased Warfarin Effect  Garlic Ginseng Ginkgo biloba Coenzyme Q10 Green tea St. Johns wort    Coumadin (Warfarin) FOOD Interactions  Eat a consistent number of servings per week of foods HIGH in Vitamin K (1 serving =  cup)  Collards (cooked, or boiled & drained) Kale (cooked, or boiled & drained) Mustard greens (cooked, or boiled & drained) Parsley *serving size only =  cup Spinach (cooked, or boiled & drained) Swiss chard (cooked, or boiled & drained) Turnip greens (cooked, or boiled & drained)  Eat a consistent number of servings per week of foods MEDIUM-HIGH in Vitamin K (1 serving = 1 cup)  Asparagus (cooked, or boiled & drained) Broccoli (cooked, boiled & drained, or raw & chopped) Brussel sprouts (cooked, or boiled & drained) *serving size only =  cup Lettuce, raw (green leaf, endive, romaine) Spinach, raw Turnip greens, raw & chopped   These websites have more information on Coumadin (warfarin):  FailFactory.se; VeganReport.com.au;

## 2014-12-15 NOTE — Progress Notes (Signed)
[facility] for Infectious Disease  Date of Admission:  12/06/2014  Antibiotics: Ampicillin and ceftriaxone  Subjective: No complaints  Objective: Temp:  [98.3 F (36.8 C)-99.3 F (37.4 C)] 99.3 F (37.4 C) (06/24 0438) Pulse Rate:  [60-70] 67 (06/24 0438) Resp:  [18] 18 (06/24 0438) BP: (107-129)/(49-61) 118/52 mmHg (06/24 0438) SpO2:  [94 %-98 %] 98 % (06/24 0438) Weight:  [150 lb 8 oz (68.266 kg)] 150 lb 8 oz (68.266 kg) (06/24 0438)  General: awake, alert, nad Skin: no rashes Lungs: CTAB Cor: RRR   Lab Results Lab Results  Component Value Date   WBC 7.0 12/15/2014   HGB 9.0* 12/15/2014   HCT 27.7* 12/15/2014   MCV 93.0 12/15/2014   PLT 188 12/15/2014    Lab Results  Component Value Date   CREATININE 1.65* 12/15/2014   BUN 18 12/15/2014   NA 134* 12/15/2014   K 3.5 12/15/2014   CL 99* 12/15/2014   CO2 26 12/15/2014    Lab Results  Component Value Date   ALT 11* 10/26/2014   AST 13* 10/26/2014   ALKPHOS 62 10/26/2014   BILITOT 0.9 10/26/2014      Microbiology: Recent Results (from the past 240 hour(s))  Blood culture (routine x 2)     Status: None   Collection Time: 12/06/14 10:00 PM  Result Value Ref Range Status   Specimen Description BLOOD RIGHT ARM  Final   Special Requests BOTTLES DRAWN AEROBIC AND ANAEROBIC 10CC EACH  Final   Culture  Setup Time   Final    GRAM POSITIVE COCCI IN PAIRS AND CHAINS Gram Stain Report Called to,Read Back By and Verified With: JAMES,T. AT [place] ON 12/07/2014 BY BAUGHAM,M Performed at [facility]    Culture   Final    ENTEROCOCCUS SPECIES Performed at [facility]    Report Status 12/09/2014 FINAL  Final   Organism ID, Bacteria ENTEROCOCCUS SPECIES  Final      Susceptibility   Enterococcus species - MIC*    AMPICILLIN <=2 SENSITIVE Sensitive     VANCOMYCIN 1 SENSITIVE Sensitive     GENTAMICIN SYNERGY SENSITIVE Sensitive     LINEZOLID 2 SENSITIVE Sensitive     * ENTEROCOCCUS SPECIES    Blood culture (routine x 2)     Status: None   Collection Time: 12/06/14 10:06 PM  Result Value Ref Range Status   Specimen Description BLOOD RIGHT HAND  Final   Special Requests BOTTLES DRAWN AEROBIC AND ANAEROBIC 10CC EACH  Final   Culture  Setup Time   Final    GRAM POSITIVE COCCI IN PAIRS AND CHAINS Gram Stain Report Called to,Read Back By and Verified With: JAMES,T. AT [id number] ON 12/07/2014 BY BAUGHAM,M. Performed at [facility]    Culture   Final    ENTEROCOCCUS SPECIES SUSCEPTIBILITIES PERFORMED ON PREVIOUS CULTURE WITHIN THE LAST 5 DAYS. Performed at [facility]    Report Status 12/09/2014 FINAL  Final  Culture, Urine     Status: None   Collection Time: 12/06/14 10:16 PM  Result Value Ref Range Status   Specimen Description URINE, CATHETERIZED  Final   Special Requests NONE  Final   Culture   Final    NO GROWTH 2 DAYS Performed at [facility]    Report Status 12/09/2014 FINAL  Final  MRSA PCR Screening     Status: None   Collection Time: 12/07/14 12:25 AM  Result Value Ref Range Status   MRSA  by PCR NEGATIVE NEGATIVE Final    Comment:        The GeneXpert MRSA Assay (FDA approved for NASAL specimens only), is one component of a comprehensive MRSA colonization surveillance program. It is not intended to diagnose MRSA infection nor to guide or monitor treatment for MRSA infections.   Culture, blood (routine x 2)     Status: None   Collection Time: 12/08/14 12:57 PM  Result Value Ref Range Status   Specimen Description BLOOD LEFT HAND  Final   Special Requests   Final    Immunocompromised BOTTLES DRAWN AEROBIC AND ANAEROBIC 6 CC EACH BOTTLE   Culture NO GROWTH 5 DAYS  Final   Report Status 12/14/2014 FINAL  Final  Culture, blood (routine x 2)     Status: None   Collection Time: 12/08/14  1:00 PM  Result Value Ref Range Status   Specimen Description BLOOD RIGHT HAND  Final   Special Requests   Final    Immunocompromised BOTTLES DRAWN  AEROBIC AND ANAEROBIC 6 CC EACH BOTTLE   Culture NO GROWTH 5 DAYS  Final   Report Status 12/14/2014 FINAL  Final  Clostridium Difficile by PCR (not at [facility])     Status: None   Collection Time: 12/10/14  9:27 AM  Result Value Ref Range Status   C difficile by pcr NEGATIVE NEGATIVE Final    Studies/Results: No results found.  Assessment/Plan:  1) ICD lead endocarditis - Enterococcus, ICD removed today.  2/2 positive and repeat cultures negative.  Ampicillin IS sensitive after discussion with micro lab.  Repeat blood cultures ngtd.  Avoiding gentamicin due to renal insufficiency and use dual beta lactam therapy with ampicillin and ceftriaxone for 6 weeks through August 2nd. This will require bid ceftriaxone and q 8 ampicillin/sulbactam at discharge (amp/sulbactam used due to cost issues).   Repeat cultures remain negative  We will arrange follow up in 2-3 weeks  thanks  Scharlene Gloss, [place] for Infectious Disease [place] [email or web address] [phone] pager   [phone] cell 12/15/2014, 10:32 AM

## 2014-12-15 NOTE — Consult Note (Signed)
   [facility] CM Inpatient Consult   12/15/2014  [name] [birth year] [id number] Met with the patient to follow up on the referral. Patient consented to [facility] services post hospital stay.  She will be followed for disease and care management needs.  Explained to the patient that there is no additional cost associated with [facility] Care Management.  Patient will be assess for home visits and post discharge telephone calls.  Consent signed. Folder with [facility] information and contact information given to the patient.  [facility] Care Management does not replace or interfere with any services needed post discharge.  For questions, please contact: Natividad Brood, RN BSN [facility] Liaison  [phone] business mobile phone

## 2014-12-15 NOTE — Progress Notes (Signed)
SUBJECTIVE: The patient is doing well today.  + R shoulder pain, worse with inspiration. At this time, she denies chest pain, or any new concerns.  CURRENT MEDICATIONS: . amiodarone  200 mg Oral Daily  . ampicillin (OMNIPEN) IV  2 g Intravenous 3 times per day  . antiseptic oral rinse  7 mL Mouth Rinse BID  . carvedilol  9.375 mg Oral BID WC  . cefTRIAXone (ROCEPHIN)  IV  2 g Intravenous Q12H  . colchicine  0.6 mg Oral Daily  . digoxin  0.0625 mg Oral QODAY  . enoxaparin (LOVENOX) injection  30 mg Subcutaneous Q24H  . feeding supplement (ENSURE ENLIVE)  237 mL Oral BID BM  . furosemide  60 mg Oral BID  . glimepiride  2 mg Oral Daily  . insulin aspart  0-15 Units Subcutaneous TID WC  . insulin aspart  0-5 Units Subcutaneous QHS  . isosorbide mononitrate  30 mg Oral Daily  . levothyroxine  25 mcg Oral QAC breakfast  . metoCLOPramide  5 mg Oral QID  . pravastatin  80 mg Oral QHS  . sodium chloride  3 mL Intravenous Q12H  . Warfarin - Pharmacist Dosing Inpatient   Does not apply q1800      OBJECTIVE: Physical Exam: Filed Vitals:   12/14/14 0532 12/14/14 1536 12/14/14 2052 12/15/14 0438  BP: 117/45 107/49 129/61 118/52  Pulse: 67 60 70 67  Temp: 98 F (36.7 C) 98.3 F (36.8 C) 98.8 F (37.1 C) 99.3 F (37.4 C)  TempSrc: Oral Oral Oral Oral  Resp: 18 18 18 18   Height:      Weight: 145 lb 1 oz (65.8 kg)   150 lb 8 oz (68.266 kg)  SpO2: 98% 94% 97% 98%    Intake/Output Summary (Last 24 hours) at 12/15/14 0739 Last data filed at 12/15/14 0600  Gross per 24 hour  Intake   1190 ml  Output   1550 ml  Net   -360 ml    Telemetry reveals sinus rhythm  GEN- The patient is elderly and chronically ill appearing, alert and oriented x 3 today.   Head- normocephalic, atraumatic Eyes-  Sclera clear, conjunctiva pink Ears- hearing intact Oropharynx- clear Neck- supple,   Lungs- Clear to ausculation bilaterally, normal work of breathing Heart- Regular rate and rhythm  GI-  soft, NT, ND, + BS Extremities- no clubbing, cyanosis, or edema Skin- no rash or lesion, right chest with dressing in place Psych- euthymic mood, full affect Neuro- strength and sensation are intact  LABS: Basic Metabolic Panel:  Recent Labs  12/13/14 0512 12/13/14 0835  NA 138 142  K 4.0 3.6  CL 102  --   CO2 26  --   GLUCOSE 73  --   BUN 22*  --   CREATININE 1.49*  --   CALCIUM 9.1  --    CBC:  Recent Labs  12/13/14 0835  HGB 9.5*  HCT 28.0*    RADIOLOGY: Dg Chest 2 View 12/06/2014   CLINICAL DATA:  Acute onset of shortness of breath. Initial encounter.  EXAM: CHEST  2 VIEW  COMPARISON:  Chest radiograph performed 11/30/2014  FINDINGS: The lungs are well-aerated. Trace fluid is suggested along the right minor fissure. Minimal bibasilar atelectasis is noted. There is no evidence of pleural effusion or pneumothorax.  The heart is borderline enlarged. A pacemaker/AICD is noted at the right chest wall, with leads ending at the right atrium, right ventricle and coronary sinus. No acute osseous  abnormalities are seen.  IMPRESSION: Trace fluid again suggested along the right minor fissure. Borderline cardiomegaly. Minimal bibasilar atelectasis noted.   Electronically Signed   By: Garald Balding M.D.   On: 12/06/2014 23:03    ASSESSMENT AND PLAN:  Principal Problem:   Bacteremia Active Problems:   HTN (hypertension)   History of pulmonary embolism   Type 2 diabetes, uncontrolled, with renal manifestation   CKD (chronic kidney disease), stage IV   Chronic combined systolic and diastolic CHF (congestive heart failure)   Chronic anticoagulation   Atrial fibrillation   Implantable cardioverter-defibrillator-CRT- Mdt   COPD (chronic obstructive pulmonary disease)   Anemia of chronic disease   Fever   SOB (shortness of breath)   ICD (implantable cardioverter-defibrillator) infection   Enterococcal bacteremia   Diabetic feet  1.  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   S/p ICD system removal 12/13/14 Will need PICC line placed for antibiotics prior to discharge Follow up in the office in 10-14 days for suture removal (I will schedule) Keep incision clean and dry until seen in office  PT evaluation today Remove foley Ok to discharge from EP standpoint once [facility] antibiotics arranged and PICC placed.  Electrophysiology team to see as needed while here. Please call with questions.  Chanetta Marshall, NP 12/15/2014 7:42 AM  I have seen, examined the patient, and reviewed the above assessment and plan.  Changes to above are made where necessary.   Electrophysiology team to see as needed while here. Our office will arrange wound check in 10 days   Co Sign: Thompson Grayer, MD 12/15/2014 8:01 AM

## 2014-12-15 NOTE — Progress Notes (Signed)
Patient discharging home today on IV antibiotics provided by [facility]. Carolynn Sayers RN with [facility] made aware; Mindi Slicker RN,BSN,MHA [phone]

## 2014-12-15 NOTE — Procedures (Signed)
Successful placement of left basilic vein approach 23 cm single lumen PICC line with tip within the left axilla.  The PICC line is ready for immediate use.

## 2014-12-15 NOTE — Progress Notes (Signed)
ANTICOAGULATION CONSULT NOTE - FOLLOW UP  Pharmacy Consult:  Coumadin Indication:  History of PE  No Known Allergies  Patient Measurements: Height: 5\' 4"  (162.6 cm) Weight: 150 lb 8 oz (68.266 kg) IBW/kg (Calculated) : 54.7  Vital Signs: Temp: 99.3 F (37.4 C) (06/24 0438) Temp Source: Oral (06/24 0438) BP: 118/52 mmHg (06/24 0438) Pulse Rate: 67 (06/24 0438)  Labs:  Recent Labs  12/13/14 0512 12/13/14 0835 12/14/14 0344 12/15/14 0318  HGB  --  9.5*  --   --   HCT  --  28.0*  --   --   LABPROT 17.5*  --  18.9* 18.8*  INR 1.42  --  1.58* 1.57*  CREATININE 1.49*  --   --   --     Estimated Creatinine Clearance: 32.9 mL/min (by C-G formula based on Cr of 1.49).    Assessment: 63 YOF on Coumadin PTA for history of PE.  She presented to the ED with chills and fever, and found to have Enterococcal ICD endocarditis.  Coumadin was held for ICD extraction and she was placed on prophylactic Lovenox.  Now s/p ICD extraction on 12/13/11 and Coumadin resumed on 12/14/14.  INR sub-therapeutic as expected; no bleeding reported.   Goal of Therapy:  INR 2-3    Plan:  - Coumadin 2mg  PO today - Continue Lovenox until INR therapeutic - Daily PT / INR - Consider holding glimepiride d/t hypoglycemia    Mckenna Boruff D. Mina Marble, PharmD, BCPS Pager:  [phone] 12/15/2014, 7:36 AM

## 2014-12-15 NOTE — Discharge Summary (Signed)
Physician Discharge Summary  [name] NTI:[id number] DOB: [birth year] DOA: 12/06/2014  PCP: Robert Bellow, MD  Admit date: 12/06/2014 Discharge date: 12/15/2014  Time spent: 40 minutes  Recommendations for Outpatient Follow-up:  1. Follow-up with Dr. Rayann Heman in 10 days. 2. Follow-up with Dr. Linus Salmons in 2-3 weeks. 3. Rocephin 1 g twice a day, Unasyn 3 g every 8 hours for 6 weeks, stop date is August 2nd of 2016. 4. Check BMP and INR in one week.  Discharge Diagnoses:  Principal Problem:   Bacteremia Active Problems:   HTN (hypertension)   History of pulmonary embolism   Type 2 diabetes, uncontrolled, with renal manifestation   CKD (chronic kidney disease), stage IV   Chronic combined systolic and diastolic CHF (congestive heart failure)   Chronic anticoagulation   Atrial fibrillation   Implantable cardioverter-defibrillator-CRT- Mdt   COPD (chronic obstructive pulmonary disease)   Anemia of chronic disease   Fever   SOB (shortness of breath)   ICD (implantable cardioverter-defibrillator) infection   Enterococcal bacteremia   Diabetic feet   Discharge Condition: Stable  Diet recommendation: Heart healthy diet  Filed Weights   12/13/14 0537 12/14/14 0532 12/15/14 0438  Weight: 66.724 kg (147 lb 1.6 oz) 65.8 kg (145 lb 1 oz) 68.266 kg (150 lb 8 oz)    History of present illness:  69 yo female h/o chf, ckd, aicd, PE, on coumadin comes in with over one day of fever, chills and today rigors. Pt has been having a mild cough but nothing too impressive. No swelling in legs. No rashes. No chest pain or abdominal pain. No dysuria or change in urinary flow. Some mild sob, no nasal congestion. No n/v/d. No sick contacts. No pain or swelling around her aicd site. Her temp is over 102 in the ED, source unclear. Asked to admit for fever. Pt given rocephin in ED for possible uti. No neck pain, vision changes.  Hospital Course:    Enterococcal bacteremia: ICD lead  endocarditis - per blood culture drawn 11/30/14 during last hospitalization, Blood cultures drawn 12/06/14 showed enterococcus sensitive to ampicillin -Patient was transferred [facility], TEE done which showed moderate to severe MR, severe LV dysfunction, soft tissue attached to the pacemaker lead with mobile portion on the distal end consistent with vegetation -Status post removal of ICD with its infected leads done by Dr. Lovena Le on 12/13/14 -Seen by ID and recommended Rocephin and ampicillin for total of 6 weeks. -PICC line placed for antibiotics, ID change recommendation to Rocephin and Unasyn till 01/23/2015.  Chronic systolic and diastolic HF - Echo 01/6760 with EF 15% and grade 2 diastolic dysfunction, remains compensated. - On amiodarone, carvedilol, imdur. - Appears to be euvolemic without any symptoms or signs suggesting decompensation. - Continue Coreg, digoxin, Lasix, Imdur, statin, not on ACEI/ARB due to renal insufficiency  COPD: remains stable at baseline  Atrial fibrillation:  -Rate controlled, continue amiodarone, digoxin  - On Coumadin per pharmacy.  - This patients CHA2DS2-VASc Score and unadjusted Ischemic Stroke Rate (% per year) is equal to 9.7 % stroke rate/year from a score of 6 for CHF, HTN, DM, CAD, age 61 and female. Above score calculated as 1 point each if present [CHF, HTN, DM, Vascular=MI/PAD/Aortic Plaque, Age if 65-74, or Female] Above score calculated as 2 points each if present [Age > 75, or Stroke/TIA/TE]  CKD: stage IV - baseline creatinine 1.8-2.0 - Creatinine at 1.6 on discharge.  HTN:BP currently stable   Diabetes mellitus - Fairly controlled, continue  sliding scale insulin     Procedures:  Extraction of ICD system secondary to ICD lead endocarditis done by Dr. Lovena Le on 12/13/2014.  Consultations:  Cardiology.  Infectious disease.  Discharge Exam: Filed Vitals:   12/15/14 0438  BP: 118/52  Pulse: 67  Temp: 99.3 F (37.4  C)  Resp: 18   General: Alert and awake, oriented x3, not in any acute distress. HEENT: anicteric sclera, pupils reactive to light and accommodation, EOMI CVS: S1-S2 clear, no murmur rubs or gallops Chest: clear to auscultation bilaterally, no wheezing, rales or rhonchi Abdomen: soft nontender, nondistended, normal bowel sounds, no organomegaly Extremities: no cyanosis, clubbing or edema noted bilaterally Neuro: Cranial nerves II-XII intact, no focal neurological deficits  Discharge Instructions   Discharge Instructions    Diet - low sodium heart healthy    Complete by:  As directed      Increase activity slowly    Complete by:  As directed           Current Discharge Medication List    START taking these medications   Details  Ampicillin-Sulbactam 3 g in sodium chloride 0.9 % 100 mL Inject 3 g into the vein every 8 (eight) hours. Qty: 3 g, Refills: 6    cefTRIAXone (ROCEPHIN) 40 MG/ML IVPB Inject 50 mLs (2 g total) into the vein every 12 (twelve) hours. Qty: 50 mL, Refills: 6      CONTINUE these medications which have NOT CHANGED   Details  amiodarone (PACERONE) 200 MG tablet Take 1 tablet (200 mg total) by mouth daily. Qty: 90 tablet, Refills: 3   Associated Diagnoses: Atrial fibrillation, unspecified    carvedilol (COREG) 3.125 MG tablet Take 3 tablets (9.375 mg total) by mouth 2 (two) times daily with a meal. Qty: 180 tablet, Refills: 2    cetirizine (ZYRTEC) 10 MG tablet Take 10 mg by mouth daily as needed for allergies.     colchicine 0.6 MG tablet Take 0.6 mg by mouth 2 (two) times daily.     digoxin (LANOXIN) 0.125 MG tablet Take 0.5 tablets (0.0625 mg total) by mouth every other day. Qty: 30 tablet, Refills: 3   Associated Diagnoses: Atrial fibrillation, persistent; Cardiomyopathy in other diseases classified elsewhere; Essential hypertension, benign; Atrial fibrillation    feeding supplement, ENSURE ENLIVE, (ENSURE ENLIVE) LIQD Take 237 mLs by mouth 2  (two) times daily between meals. Qty: 237 mL, Refills: 12    ferrous sulfate 324 (65 FE) MG TBEC Take 1 tablet (325 mg total) by mouth 2 (two) times daily. Qty: 60 tablet    furosemide (LASIX) 40 MG tablet TAKE 1 AND 1/2 TABLETS BY MOUTH TWICE DAILY. Qty: 90 tablet, Refills: 3    glimepiride (AMARYL) 2 MG tablet Take 2 mg by mouth daily.    isosorbide mononitrate (IMDUR) 30 MG 24 hr tablet Take 1 tablet (30 mg total) by mouth daily. Qty: 30 tablet, Refills: 0    levothyroxine (SYNTHROID, LEVOTHROID) 25 MCG tablet Take 1 tablet (25 mcg total) by mouth daily before breakfast. Qty: 30 tablet, Refills: 0    metoCLOPramide (REGLAN) 5 MG tablet Take 5 mg by mouth 4 (four) times daily.    ondansetron (ZOFRAN) 4 MG tablet Take 4 mg by mouth every 4 (four) hours as needed for nausea or vomiting.    oxyCODONE-acetaminophen (PERCOCET) 10-325 MG per tablet Take 1 tablet by mouth every 6 (six) hours as needed for pain.     pravastatin (PRAVACHOL) 40 MG tablet Take 80 mg by  mouth at bedtime.     warfarin (COUMADIN) 2.5 MG tablet Take 0.5 tablets (1.25 mg total) by mouth daily at 6 PM. Qty: 10 tablet, Refills: 0    polyethylene glycol powder (GLYCOLAX/MIRALAX) powder Take 1 Container by mouth daily as needed for mild constipation.        No Known Allergies Follow-up Information    Follow up with [facility] On 12/28/2014.   Why:  at [facility] for wound check   Contact information:   [address] [place] [address]        The results of significant diagnostics from this hospitalization (including imaging, microbiology, ancillary and laboratory) are listed below for reference.    Significant Diagnostic Studies: Ct Abdomen Pelvis Wo Contrast  12/09/2014   CLINICAL DATA:  Fever of unknown origin. History of chronic renal disease.  EXAM: CT ABDOMEN AND PELVIS WITHOUT CONTRAST  TECHNIQUE: Multidetector CT imaging of the abdomen and pelvis was performed  following the standard protocol without IV contrast.  COMPARISON:  07/06/2013.  FINDINGS: Significant decrease in pericardial fluid with minimal fluid remaining. This measures 7 mm in maximum thickness.  Calcified granuloma in the spleen. Interval mild bilateral perinephric soft tissue stranding and fluid. Interval mid inhomogeneity of the lower pole of the right kidney. Prominent extrarenal pelves are unchanged. No hydronephrosis or urinary tract calculi seen.  Right hip prosthesis with associated streak artifacts. Calcified granulomata in the spleen. Interval minimal perihepatic free peritoneal fluid. There is also small amount of free peritoneal fluid in the pelvis. No significant change in a low density right adrenal mass, measuring 1.5 x 1.3 cm on image number 19 and -4 Hounsfield units in density.  A small cyst in the dome of the liver on the right is unchanged. Poorly distended gallbladder. Unremarkable non contrasted appearance of the pancreas and left adrenal gland.  Multiple colonic diverticula without evidence of diverticulitis. No enlarged lymph nodes. No evidence of appendicitis. Cardiac pacer and AICD leads. Stable scarring at both lung bases. Severe left hip degenerative changes with bony remodeling. Lumbar and lower thoracic spine degenerative changes and mild scoliosis. Atheromatous arterial calcifications.  IMPRESSION: 1. Interval bilateral perinephric soft tissue stranding and edema and inhomogeneity of the lower pole of the right kidney. These findings are suspicious for acute pyelonephritis. 2. Interval small amount of free peritoneal fluid. 3. Colonic diverticulosis without evidence of diverticulitis. 4. Stable right adrenal adenoma. 5. Significantly smaller pericardial effusion.   Electronically Signed   By: Claudie Revering M.D.   On: 12/09/2014 19:18   Dg Chest 2 View  12/06/2014   CLINICAL DATA:  Acute onset of shortness of breath. Initial encounter.  EXAM: CHEST  2 VIEW  COMPARISON:  Chest  radiograph performed 11/30/2014  FINDINGS: The lungs are well-aerated. Trace fluid is suggested along the right minor fissure. Minimal bibasilar atelectasis is noted. There is no evidence of pleural effusion or pneumothorax.  The heart is borderline enlarged. A pacemaker/AICD is noted at the right chest wall, with leads ending at the right atrium, right ventricle and coronary sinus. No acute osseous abnormalities are seen.  IMPRESSION: Trace fluid again suggested along the right minor fissure. Borderline cardiomegaly. Minimal bibasilar atelectasis noted.   Electronically Signed   By: Garald Balding M.D.   On: 12/06/2014 23:03   Dg Chest 2 View  11/30/2014   CLINICAL DATA:  Shortness of breath.  EXAM: CHEST  2 VIEW  COMPARISON:  10/17/2014.  01/10/2014.  FINDINGS: Mediastinum  hilar structures are normal. Cardiac pacer with lead tips in right atrium right ventricle. Cardiomegaly. Mild bilateral interstitial prominence with Kerley B-lines noted. These findings are most consistent mild congestive heart failure. Tiny left pleural effusion cannot be excluded. No pneumothorax. Degenerative changes both shoulders.  IMPRESSION: Findings consistent with mild congestive heart failure with pulmonary interstitial edema and tiny left pleural effusion. Cardiac pacer noted in stable position.   Electronically Signed   By: Marcello Moores  Register   On: 11/30/2014 08:42   US Renal  11/30/2014   CLINICAL DATA:  Chronic kidney disease stage 4  EXAM: RENAL / URINARY TRACT ULTRASOUND COMPLETE  COMPARISON:  01/17/2014  FINDINGS: Right Kidney:  Length: 9.6 cm. Extrarenal pelvis on the right similar to prior study. 12 mm cyst midpole. Increased renal echogenicity.  Left Kidney:  Length: 11.1 cm. 12 mm upper pole cyst. Increased echogenicity of the renal cortex.  Bladder:  Appears normal for degree of bladder distention.  IMPRESSION: Medical renal disease.   Electronically Signed   By: Skipper Cliche M.D.   On: 11/30/2014 17:22     Microbiology: Recent Results (from the past 240 hour(s))  Blood culture (routine x 2)     Status: None   Collection Time: 12/06/14 10:00 PM  Result Value Ref Range Status   Specimen Description BLOOD RIGHT ARM  Final   Special Requests BOTTLES DRAWN AEROBIC AND ANAEROBIC 10CC EACH  Final   Culture  Setup Time   Final    GRAM POSITIVE COCCI IN PAIRS AND CHAINS Gram Stain Report Called to,Read Back By and Verified With: JAMES,T. AT 2094 ON 12/07/2014 BY BAUGHAM,M Performed at [facility]    Culture   Final    ENTEROCOCCUS SPECIES Performed at [facility]    Report Status 12/09/2014 FINAL  Final   Organism ID, Bacteria ENTEROCOCCUS SPECIES  Final      Susceptibility   Enterococcus species - MIC*    AMPICILLIN <=2 SENSITIVE Sensitive     VANCOMYCIN 1 SENSITIVE Sensitive     GENTAMICIN SYNERGY SENSITIVE Sensitive     LINEZOLID 2 SENSITIVE Sensitive     * ENTEROCOCCUS SPECIES  Blood culture (routine x 2)     Status: None   Collection Time: 12/06/14 10:06 PM  Result Value Ref Range Status   Specimen Description BLOOD RIGHT HAND  Final   Special Requests BOTTLES DRAWN AEROBIC AND ANAEROBIC 10CC EACH  Final   Culture  Setup Time   Final    GRAM POSITIVE COCCI IN PAIRS AND CHAINS Gram Stain Report Called to,Read Back By and Verified With: JAMES,T. AT 7096 ON 12/07/2014 BY BAUGHAM,M. Performed at [facility]    Culture   Final    ENTEROCOCCUS SPECIES SUSCEPTIBILITIES PERFORMED ON PREVIOUS CULTURE WITHIN THE LAST 5 DAYS. Performed at [facility]    Report Status 12/09/2014 FINAL  Final  Culture, Urine     Status: None   Collection Time: 12/06/14 10:16 PM  Result Value Ref Range Status   Specimen Description URINE, CATHETERIZED  Final   Special Requests NONE  Final   Culture   Final    NO GROWTH 2 DAYS Performed at [facility]    Report Status 12/09/2014 FINAL  Final  MRSA PCR Screening     Status: None   Collection Time: 12/07/14  12:25 AM  Result Value Ref Range Status   MRSA by PCR NEGATIVE NEGATIVE Final    Comment:  The GeneXpert MRSA Assay (FDA approved for NASAL specimens only), is one component of a comprehensive MRSA colonization surveillance program. It is not intended to diagnose MRSA infection nor to guide or monitor treatment for MRSA infections.   Culture, blood (routine x 2)     Status: None   Collection Time: 12/08/14 12:57 PM  Result Value Ref Range Status   Specimen Description BLOOD LEFT HAND  Final   Special Requests   Final    Immunocompromised BOTTLES DRAWN AEROBIC AND ANAEROBIC 6 CC EACH BOTTLE   Culture NO GROWTH 5 DAYS  Final   Report Status 12/14/2014 FINAL  Final  Culture, blood (routine x 2)     Status: None   Collection Time: 12/08/14  1:00 PM  Result Value Ref Range Status   Specimen Description BLOOD RIGHT HAND  Final   Special Requests   Final    Immunocompromised BOTTLES DRAWN AEROBIC AND ANAEROBIC 6 CC EACH BOTTLE   Culture NO GROWTH 5 DAYS  Final   Report Status 12/14/2014 FINAL  Final  Clostridium Difficile by PCR (not at [facility])     Status: None   Collection Time: 12/10/14  9:27 AM  Result Value Ref Range Status   C difficile by pcr NEGATIVE NEGATIVE Final     Labs: Basic Metabolic Panel:  Recent Labs Lab 12/11/14 0400 12/12/14 0530 12/13/14 0512 12/13/14 0835 12/15/14 0824  NA 137 139 138 142 134*  K 3.6 3.7 4.0 3.6 3.5  CL 107 106 102  --  99*  CO2 23 25 26   --  26  GLUCOSE 81 81 73  --  126*  BUN 21* 23* 22*  --  18  CREATININE 1.58* 1.39* 1.49*  --  1.65*  CALCIUM 8.5* 8.8* 9.1  --  8.5*   Liver Function Tests: No results for input(s): AST, ALT, ALKPHOS, BILITOT, PROT, ALBUMIN in the last 168 hours. No results for input(s): LIPASE, AMYLASE in the last 168 hours. No results for input(s): AMMONIA in the last 168 hours. CBC:  Recent Labs Lab 12/13/14 0835 12/15/14 0824  WBC  --  7.0  HGB 9.5* 9.0*  HCT 28.0* 27.7*  MCV  --  93.0  PLT   --  188   Cardiac Enzymes: No results for input(s): CKTOTAL, CKMB, CKMBINDEX, TROPONINI in the last 168 hours. BNP: BNP (last 3 results)  Recent Labs  10/17/14 1909 11/30/14 0825  BNP 488.1* 1322.3*    ProBNP (last 3 results)  Recent Labs  04/11/14 1220 04/27/14 1045 05/30/14 1016  PROBNP 8376.0* 14381.0* 6690.0*    CBG:  Recent Labs Lab 12/14/14 1249 12/14/14 1659 12/14/14 2053 12/14/14 2126 12/15/14 0555  GLUCAP 72 67 60* 71 100*       Signed:  Devynn Hessler A  [facility] 12/15/2014, 10:58 AM

## 2014-12-15 NOTE — Progress Notes (Signed)
Inpatient Diabetes Program Recommendations  AACE/ADA: New Consensus Statement on Inpatient Glycemic Control (2013)  Target Ranges:  Prepandial:   less than 140 mg/dL      Peak postprandial:   less than 180 mg/dL (1-2 hours)      Critically ill patients:  140 - 180 mg/dL   Inpatient Diabetes Program Recommendations Correction (SSI): decrease to sensitive scale Oral Agents: noted amaryl discontinued Sensitive tidwc without HS coverage  Thank you Rosita Kea, RN, MSN, CDE  Diabetes Inpatient Program Office: [phone] Pager: [phone] 8:00 am to 5:00 pm

## 2014-12-15 NOTE — Progress Notes (Signed)
Pt a/o, c/o pain at incision, pts Foley d/c'd DTV at 6pm, IV abx given [facility] saw pt, pt being d/c to home with home health

## 2014-12-15 NOTE — Patient Outreach (Signed)
[facility]) Care Management  12/15/2014  RENEISHA STILLEY 1945/09/16 [id number]   Referral from Natividad Brood, RN, assigned Burgess Amor, RN to outreach.  [name]. [place], [place] Management [place] Assistant Phone: [phone] Fax: [phone]

## 2014-12-18 ENCOUNTER — Other Ambulatory Visit: Payer: Self-pay | Admitting: *Deleted

## 2014-12-18 ENCOUNTER — Other Ambulatory Visit (HOSPITAL_COMMUNITY)
Admission: RE | Admit: 2014-12-18 | Discharge: 2014-12-18 | Disposition: A | Discharge status: Home / Self Care | Payer: Commercial Managed Care - HMO | Source: Other Acute Inpatient Hospital | Attending: Family Medicine | Admitting: Family Medicine

## 2014-12-18 DIAGNOSIS — E118 Type 2 diabetes mellitus with unspecified complications: Secondary | ICD-10-CM | POA: Insufficient documentation

## 2014-12-18 LAB — URINE MICROSCOPIC-ADD ON

## 2014-12-18 LAB — URINALYSIS, ROUTINE W REFLEX MICROSCOPIC
Bilirubin Urine: NEGATIVE
Glucose, UA: NEGATIVE mg/dL
Ketones, ur: NEGATIVE mg/dL
LEUKOCYTES UA: NEGATIVE
Nitrite: NEGATIVE
SPECIFIC GRAVITY, URINE: 1.02 (ref 1.005–1.030)
UROBILINOGEN UA: 0.2 mg/dL (ref 0.0–1.0)
pH: 6.5 (ref 5.0–8.0)

## 2014-12-18 NOTE — Patient Outreach (Signed)
TOC week #1 call completed with patient and spouse. RNCM reviewed [facility] program and purpose of call.  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 Patient and spouse report that patient stable since coming home from hospital. Patient home health has started, she is getting nurse and PT 2 times a week. Nurse is monitoring health and incision site, states it is all "normal" Patient also reporting that spouse is administering IV antibiotics for her, no problems so far with administering medications. Follow up appointments have been scheduled and patient and spouse aware, they have transportation.  Patient does report poor appetite, no weight loss, reports weight is stable, but does drink 1-2 ensure daily.   Plan to continue [facility] program and Transition of care calls, scheduled initial outreach home visit for July 13. RNCM gave contact name and number for any questions or concerns.  Royetta Crochet. Laymond Purser, RN, BSN, [place] [phone]

## 2014-12-20 ENCOUNTER — Telehealth: Payer: Self-pay | Admitting: Internal Medicine

## 2014-12-20 LAB — URINE CULTURE: CULTURE: NO GROWTH

## 2014-12-20 NOTE — Telephone Encounter (Signed)
Spoke w/ pt and I informed pt that the letter was sent before I spoke w/ her on 12-14-14 and she can disregard letter. Pt verbalized understanding.

## 2014-12-20 NOTE — Telephone Encounter (Signed)
FYI   Pt received a letter regarding device. Husband states she no longer has a device.

## 2014-12-21 ENCOUNTER — Telehealth: Payer: Self-pay | Admitting: Internal Medicine

## 2014-12-21 NOTE — Telephone Encounter (Signed)
New Message  RN from [facility] calling to speak w/ RN about incision- fluid filled pocket under incision. Please call back and discuss.

## 2014-12-21 NOTE — Telephone Encounter (Signed)
Discussed with Dr Lovena Le and spoke with the [facility].  The area is not red or hot to touch just feels like there is fluid in pocket.  No drainage.  Dr Lovena Le says unless it started to drain or she ran a fever to keep a watch on it and keep her appointment on 12/28/14 for wound check.  [facility] aware and will call with any changes

## 2014-12-28 ENCOUNTER — Encounter: Payer: Self-pay | Admitting: Internal Medicine

## 2014-12-28 ENCOUNTER — Encounter (HOSPITAL_COMMUNITY)
Admission: RE | Admit: 2014-12-28 | Discharge: 2014-12-28 | Disposition: A | Discharge status: Home / Self Care | Payer: Commercial Managed Care - HMO | Source: Ambulatory Visit | Attending: Family Medicine | Admitting: Family Medicine

## 2014-12-28 ENCOUNTER — Ambulatory Visit (INDEPENDENT_AMBULATORY_CARE_PROVIDER_SITE_OTHER): Payer: Commercial Managed Care - HMO | Admitting: Internal Medicine

## 2014-12-28 ENCOUNTER — Other Ambulatory Visit: Payer: Self-pay | Admitting: Internal Medicine

## 2014-12-28 ENCOUNTER — Ambulatory Visit: Payer: Commercial Managed Care - HMO | Admitting: *Deleted

## 2014-12-28 ENCOUNTER — Encounter (HOSPITAL_COMMUNITY): Payer: Self-pay

## 2014-12-28 ENCOUNTER — Ambulatory Visit (HOSPITAL_COMMUNITY)
Admission: RE | Admit: 2014-12-28 | Discharge: 2014-12-28 | Disposition: A | Discharge status: Home / Self Care | Payer: Commercial Managed Care - HMO | Source: Ambulatory Visit | Attending: Internal Medicine | Admitting: Internal Medicine

## 2014-12-28 VITALS — BP 106/63 | HR 70 | Temp 98.1°F | Wt 147.0 lb

## 2014-12-28 DIAGNOSIS — D631 Anemia in chronic kidney disease: Secondary | ICD-10-CM | POA: Insufficient documentation

## 2014-12-28 DIAGNOSIS — T827XXD Infection and inflammatory reaction due to other cardiac and vascular devices, implants and grafts, subsequent encounter: Secondary | ICD-10-CM

## 2014-12-28 DIAGNOSIS — Z7901 Long term (current) use of anticoagulants: Secondary | ICD-10-CM | POA: Diagnosis not present

## 2014-12-28 DIAGNOSIS — T827XXA Infection and inflammatory reaction due to other cardiac and vascular devices, implants and grafts, initial encounter: Secondary | ICD-10-CM

## 2014-12-28 DIAGNOSIS — I330 Acute and subacute infective endocarditis: Secondary | ICD-10-CM | POA: Diagnosis not present

## 2014-12-28 DIAGNOSIS — N189 Chronic kidney disease, unspecified: Secondary | ICD-10-CM | POA: Diagnosis present

## 2014-12-28 LAB — HEMOGLOBIN AND HEMATOCRIT, BLOOD
HEMATOCRIT: 27.9 % — AB (ref 36.0–46.0)
Hemoglobin: 9 g/dL — ABNORMAL LOW (ref 12.0–15.0)

## 2014-12-28 MED ORDER — HEPARIN SOD (PORK) LOCK FLUSH 100 UNIT/ML IV SOLN
INTRAVENOUS | Status: AC
  Filled 2014-12-28: qty 5

## 2014-12-28 MED ORDER — LIDOCAINE HCL 1 % IJ SOLN
INTRAMUSCULAR | Status: AC
  Filled 2014-12-28: qty 20

## 2014-12-28 NOTE — Progress Notes (Signed)
Wound check in clinic s/p ICD extraction 12/13/14. Sutures removed. Wound well healed- no redness, edema, drainage. Wound care instructions given- leave open to air, wash with soap and water, no ointments or lotions over incision. Advised to call device clinic if redness, swelling, drainage, fever or chills noted. Dr. Lovena Le recommends to follow up with HF clinic, infectious disease for antibiotic management and he will see them as needed. He is not inclined to reimplant ICD.  Pt and husband verbalized understanding.

## 2014-12-28 NOTE — Progress Notes (Signed)
   Subjective:    Patient ID: [name], female    DOB: 06-06-1946, 69 y.o.   MRN: [id number]  HPI She comes in for hospital follow-up. She has a history of enterococcal ICD infection previously in 2007 and was hospitalized for fever and chills and blood cultures again were enterococcus positive. It was ampicillin sensitive and she was started on antibiotics with initially ampicillin and gentamicin however this was changed due to some renal insufficiency to ampicillin with ceftriaxone. She did have her ICD evaluated and noted a vegetation and so this was removed. The plan is to not replace ICD. She then was discharged on ceftriaxone and Unasyn (due to cost difference compared to ampicillin), and has now finished about 2 weeks of a projected 6 week course. She did have problems with her PICC line yesterday and had to be removed. She otherwise is doing well with no fever or chills. She has had some anemia with her last hemoglobin 8. Her primary physician is scheduling her for a transfusion. Otherwise no diarrhea or rashes.   Review of Systems  Constitutional: Positive for fatigue. Negative for fever and chills.  Gastrointestinal: Negative for nausea and diarrhea.  Skin: Negative for rash.  Neurological: Negative for dizziness and light-headedness.       Objective:   Physical Exam  Constitutional: She appears well-developed and well-nourished. No distress.  Eyes: No scleral icterus.  Cardiovascular: Normal rate, regular rhythm and normal heart sounds.   No murmur heard. Pulmonary/Chest: Effort normal and breath sounds normal. No respiratory distress.  Skin: No rash noted.          Assessment & Plan:

## 2014-12-28 NOTE — Progress Notes (Addendum)
Results for [name], [name] (MRN [id number]) as of 12/28/2014 15:15 Pt for 2 units PC in the am   Ref. Range 12/28/2014 14:45  Hemoglobin Latest Ref Range: 12.0-15.0 g/dL 9.0 (L)  HCT Latest Ref Range: 36.0-46.0 % 27.9 (L)   Dr. Karie Kirks called with results, cancelled order for blood transfusion in the am. Patient notified, and acknowledged.

## 2014-12-28 NOTE — Procedures (Signed)
Successful placement of dual lumen PICC line to the right basilic vein. Length 46 cm Tip at lower SVC/RA No complications Ready for use.  Chastin Riesgo S Kaytie Ratcliffe PA-C @TIMESTAMP @

## 2014-12-28 NOTE — Discharge Instructions (Signed)

## 2014-12-28 NOTE — Assessment & Plan Note (Signed)
She continues to be on antibiotics and will continue for about 4 more weeks for enterococcal bacteremia and ICD vegetation. Her PICC line will be replaced at interventional radiology today. I will have her return in about 3-4 weeks.

## 2014-12-29 ENCOUNTER — Other Ambulatory Visit: Payer: Self-pay | Admitting: *Deleted

## 2014-12-29 ENCOUNTER — Encounter (HOSPITAL_COMMUNITY): Admission: RE | Admit: 2014-12-29 | Payer: Commercial Managed Care - HMO | Source: Ambulatory Visit

## 2014-12-29 NOTE — Patient Outreach (Signed)
TOC week #2  Call to patient for Transition of care. Patient reporting she has been doing well, she is still on her antibiotics, no problem with them so far. Spouse continues to assist with care.  Patient reports she has several appointments next week but will still be able to see RNCM at scheduled appointment time. Plan to visit next week for community assessment and continue to monitor patient with Transition of care calls.  Royetta Crochet. Laymond Purser, RN, BSN, [place] [phone]

## 2015-01-01 ENCOUNTER — Encounter (HOSPITAL_BASED_OUTPATIENT_CLINIC_OR_DEPARTMENT_OTHER): Payer: Commercial Managed Care - HMO | Admitting: Oncology

## 2015-01-01 ENCOUNTER — Other Ambulatory Visit: Payer: Self-pay | Admitting: Physician Assistant

## 2015-01-01 ENCOUNTER — Encounter (HOSPITAL_COMMUNITY): Payer: Self-pay | Admitting: Oncology

## 2015-01-01 VITALS — BP 126/50 | HR 62 | Temp 98.2°F | Resp 18 | Wt 152.0 lb

## 2015-01-01 DIAGNOSIS — D638 Anemia in other chronic diseases classified elsewhere: Secondary | ICD-10-CM | POA: Diagnosis not present

## 2015-01-01 DIAGNOSIS — D509 Iron deficiency anemia, unspecified: Secondary | ICD-10-CM | POA: Diagnosis not present

## 2015-01-01 DIAGNOSIS — D631 Anemia in chronic kidney disease: Secondary | ICD-10-CM

## 2015-01-01 DIAGNOSIS — R7881 Bacteremia: Secondary | ICD-10-CM

## 2015-01-01 DIAGNOSIS — N184 Chronic kidney disease, stage 4 (severe): Secondary | ICD-10-CM

## 2015-01-01 DIAGNOSIS — D500 Iron deficiency anemia secondary to blood loss (chronic): Secondary | ICD-10-CM

## 2015-01-01 DIAGNOSIS — I4891 Unspecified atrial fibrillation: Secondary | ICD-10-CM

## 2015-01-01 DIAGNOSIS — N189 Chronic kidney disease, unspecified: Secondary | ICD-10-CM

## 2015-01-01 NOTE — Progress Notes (Signed)
[facility] Hematology/Oncology Consultation   Name: [name]      MRN: [id number]    Location: Room/bed info not found  Date: 01/01/2015 Time:12:30 PM   REFERRING PHYSICIAN:  Dr. Mahala Menghini  REASON FOR CONSULT:  Anemia of chronic disease   DIAGNOSIS:  Anemia of chronic disease with an significant element of anemia of chronic renal disease.  HISTORY OF PRESENT ILLNESS:   [name] is a pleasant 69 year old white American woman with a past medical history significant for Type II DM with renal complications, LBBB, ICD, hypothyroidism, HTN, dyslipidemia, COPD, Chronic systolic and diastolic CHF, A-Fib, and chronic renal disease stage IV who is referred to [facility] for evaluation and management of anemia, normocytic, normochromic.  She has been seen in the hospital by Dr. Beryle Beams (Heme) in May 2016.  I personally reviewed and went over laboratory results with the patient.  The results are noted within this dictation.  I personally reviewed and went over radiographic studies with the patient.  The results are noted within this dictation.    Chart reviewed.  [name] has been in and out of the hospital quite a bit as of late. Her most recent admission was from enterococcus bacteremia with an involved subcutaneous ICD requiring removal.  Now, as an outpatient, she is on IV antibiotics.  She notes that she has approximately 3 more weeks worth.    Her anemia work-up is complete including colonoscopy, EGD, and camera capsule study for a GI standpoint,  No source of bleeding was identified.  From a hematology standpoint, her multiple myeloma work-up is negative.  Bone marrow aspiration and biopsy is not indicated at this time as it would not change our treatment.  She was started on 40 mcg of Aranesp in the hospital on 10/20/2014 and another dose of 100 mcg on 12/01/2014, but none since.  She did receive Feraheme 510 mg on 4/29 and 12/01/2014 when her ferritin was in the  teens.   PAST MEDICAL HISTORY:   Past Medical History  Diagnosis Date  . Cardiomyopathy, nonischemic     a. 1999 nl cath;  b. 12/05 [employer];  c. 10/2005 ICD extraction 2/2 enterococcus bacteremia and Veg on RV lead;  c. 05/2008 low risk Myoview (scarring w/ some evidence of inf ischemia);  d. 11/2012 Echo: EF 15-20%;  e. 01/2013 s/p MDT [place] CRT D, ser # [id number];  f. 05/2013 Echo: EF 15%.  . Enterococcal infection     a. 10/2005 - AICD-explanted  . Pulmonary embolism     a. 07/2005 after total right hip arthroplasty  . Hilar density     a. infrahilar mass/adenopathy on CT scan 5/07; subsequently  resolved  . LBBB (left bundle branch block)   . GERD (gastroesophageal reflux disease)   . Hyperlipidemia   . Hypertension   . Tobacco abuse     a. discontinued in 1997, and then resumed  . Urinary incontinence   . Anemia     a. mild/chronic  . Villous adenoma of colon     a. tubovillous adenomatous polyp with focal high grade dysplasia; presented with hematochezia - followed by Dr. Laural Golden.  . Implantable cardioverter-defibrillator-CRT- Mdt     a.  01/2013 s/p MDT [place] CRT D, ser # [id number]  . Chronic systolic CHF (congestive heart failure)     a. 11/2012 Echo: EF 15-20%;  b. 05/2013 TEE EF 15%.  . Pneumonia 07/2005  . Obstructive  sleep apnea     a. mild-did not tolerate CPAP (01/24/2013)  . History of blood transfusion   . Degenerative joint disease     of knees, shoulder, and hips  . PAF (paroxysmal atrial fibrillation)     a. 07/2012 s/p TEE/DCCV;  b. chronic coumadin;  c. 05/2013 Recurrent Afib->TEE/DCCV and amio initiation.  . CKD (chronic kidney disease), stage III     creatinin-1.44 in 1/09; 1.51 in 1/10  . Type II diabetes mellitus     type 2    ALLERGIES: No Known Allergies    MEDICATIONS: I have reviewed the patient's current medications.    Current Outpatient Prescriptions on File Prior to Visit  Medication Sig Dispense Refill  . amiodarone (PACERONE) 200  MG tablet Take 1 tablet (200 mg total) by mouth daily. 90 tablet 3  . Ampicillin-Sulbactam 3 g in sodium chloride 0.9 % 100 mL Inject 3 g into the vein every 8 (eight) hours. 3 g 6  . carvedilol (COREG) 3.125 MG tablet Take 3 tablets (9.375 mg total) by mouth 2 (two) times daily with a meal. 180 tablet 2  . cefTRIAXone (ROCEPHIN) 40 MG/ML IVPB Inject 50 mLs (2 g total) into the vein every 12 (twelve) hours. 50 mL 6  . digoxin (LANOXIN) 0.125 MG tablet Take 0.5 tablets (0.0625 mg total) by mouth every other day. 30 tablet 3  . feeding supplement, ENSURE ENLIVE, (ENSURE ENLIVE) LIQD Take 237 mLs by mouth 2 (two) times daily between meals. 237 mL 12  . ferrous sulfate 324 (65 FE) MG TBEC Take 1 tablet (325 mg total) by mouth 2 (two) times daily. 60 tablet   . furosemide (LASIX) 40 MG tablet TAKE 1 AND 1/2 TABLETS BY MOUTH TWICE DAILY. 90 tablet 3  . glimepiride (AMARYL) 2 MG tablet Take 2 mg by mouth daily.    . isosorbide mononitrate (IMDUR) 30 MG 24 hr tablet Take 1 tablet (30 mg total) by mouth daily. 30 tablet 0  . levothyroxine (SYNTHROID, LEVOTHROID) 25 MCG tablet Take 1 tablet (25 mcg total) by mouth daily before breakfast. 30 tablet 0  . metoCLOPramide (REGLAN) 5 MG tablet Take 5 mg by mouth 4 (four) times daily.    . ondansetron (ZOFRAN) 4 MG tablet Take 4 mg by mouth every 4 (four) hours as needed for nausea or vomiting.    Marland Kitchen oxyCODONE-acetaminophen (PERCOCET) 10-325 MG per tablet Take 1 tablet by mouth every 6 (six) hours as needed for pain.     . polyethylene glycol powder (GLYCOLAX/MIRALAX) powder Take 1 Container by mouth daily as needed for mild constipation.     . pravastatin (PRAVACHOL) 40 MG tablet Take 80 mg by mouth at bedtime.     Marland Kitchen warfarin (COUMADIN) 2.5 MG tablet Take 0.5 tablets (1.25 mg total) by mouth daily at 6 PM. 10 tablet 0  . cetirizine (ZYRTEC) 10 MG tablet Take 10 mg by mouth daily as needed for allergies.     Marland Kitchen colchicine 0.6 MG tablet Take 0.6 mg by mouth 2 (two)  times daily.      No current facility-administered medications on file prior to visit.     PAST SURGICAL HISTORY Past Surgical History  Procedure Laterality Date  . Pacemaker removal  11/18/05    Enterococcal infection  . Total hip arthroplasty Right 07/2005  . Knee arthroscopy Right 1980's?  . Colonoscopy w/ polypectomy  2009  . Cardioversion N/A 08/20/2012    Procedure: TEE GUIDED CARDIOVERSION;  Surgeon: Yehuda Savannah,  MD;  Location: [facility];  Service: Cardiovascular;  Laterality: N/A;  To be done @ bedside  . Tee without cardioversion N/A 08/20/2012    Procedure: TRANSESOPHAGEAL ECHOCARDIOGRAM (TEE);  Surgeon: Yehuda Savannah, MD;  Location: [facility];  Service: Cardiovascular;  Laterality: N/A;  . Bi-ventricular implantable cardioverter defibrillator  (crt-d)  01/24/2013  . A-v cardiac pacemaker insertion  12/05    Biventricular pacemaker/AICD  . Abdominal hysterectomy  1990/92    Initial partial hysterectomy followed by BSO  . Tubal ligation  1980's  . Cardiac catheterization    . Colonoscopy with esophagogastroduodenoscopy (egd) N/A 06/10/2013    Procedure: COLONOSCOPY WITH ESOPHAGOGASTRODUODENOSCOPY (EGD);  Surgeon: Rogene Houston, MD;  [facility];  Service: Endoscopy;  Laterality: N/A;  925  . Tee without cardioversion N/A 06/20/2013    Procedure: TRANSESOPHAGEAL ECHOCARDIOGRAM (TEE);  Surgeon: Dorothy Spark, MD;  Location: [place];  Service: Cardiovascular;  Laterality: N/A;  . Cardioversion N/A 06/20/2013    Procedure: CARDIOVERSION;  Surgeon: Dorothy Spark, MD;  Location: [facility] ENDOSCOPY;  Service: Cardiovascular;  Laterality: N/A;  . Bi-ventricular implantable cardioverter defibrillator N/A 01/24/2013    Procedure: BI-VENTRICULAR IMPLANTABLE CARDIOVERTER DEFIBRILLATOR  (CRT-D);  Surgeon: Evans Lance, MD;  Location: [facility] CATH LAB;  Service: Cardiovascular;  Laterality: N/A;  . Right heart catheterization N/A 04/18/2014    Procedure: RIGHT HEART CATH;   Surgeon: Larey Dresser, MD;  Location: [facility] CATH LAB;  Service: Cardiovascular;  Laterality: N/A;  . Esophagogastroduodenoscopy N/A 10/21/2014    Procedure: ESOPHAGOGASTRODUODENOSCOPY (EGD);  Surgeon: Inda Castle, MD;  Location: [place];  Service: Endoscopy;  Laterality: N/A;  . Colonoscopy Left 10/24/2014    Procedure: COLONOSCOPY;  Surgeon: Carol Ada, MD;  Location: [facility] ENDOSCOPY;  Service: Endoscopy;  Laterality: Left;  Freda Munro capsule study N/A 10/24/2014    Procedure: GIVENS CAPSULE STUDY;  Surgeon: Carol Ada, MD;  Location: [facility] ENDOSCOPY;  Service: Endoscopy;  Laterality: N/A;  . Tee without cardioversion N/A 12/08/2014    Procedure: TRANSESOPHAGEAL ECHOCARDIOGRAM (TEE);  Surgeon: Fay Records, MD;  [facility];  Service: Cardiovascular;  Laterality: N/A;  . Icd lead removal N/A 12/13/2014    Procedure: ICD LEAD REMOVAL/EXTRACTION ;  Surgeon: Evans Lance, MD;  Location: [place];  Service: Cardiovascular;  Laterality: N/A;  [place] back up    FAMILY HISTORY: Family History  Problem Relation Age of Onset  . Hypertension Mother   . Diabetes Mother   . Coronary artery disease Father   . Diabetes Brother   . Hypertension Brother   . Lung cancer Brother   . Arthritis Other   . Diabetes Other   . Heart disease Other     female < 42   She has 6 children (ages 40- 28) with 54 grandchildren, and 10 great-grandchildren.  She has been married x 51 years.  SOCIAL HISTORY:  reports that she has quit smoking. Her smoking use included Cigarettes. She quit after 12 years of use. She has never used smokeless tobacco. She reports that she does not drink alcohol or use illicit drugs.  She denies any EtOH, tobacco, and illicit drug abuse.  She used to smoke but "did not inhale."  She used to work as a Secretary/administrator at [employer].  She retired with disability from her heart condition(s).  PERFORMANCE STATUS: The patient's performance status is 1 - Symptomatic but completely  ambulatory  PHYSICAL EXAM: Most Recent Vital Signs: Blood pressure 126/50, pulse 62,  temperature 98.2 F (36.8 C), temperature source Oral, resp. rate 18, weight 152 lb (68.947 kg), SpO2 98 %. General appearance: alert, cooperative, no distress and accompanied by husband Head: Normocephalic, without obvious abnormality, atraumatic Neck: no adenopathy and supple, symmetrical, trachea midline Lungs: clear to auscultation bilaterally and normal percussion bilaterally Heart: irregularly irregular rhythm and gallop noted and systolic ejection murmur Extremities: edema right lower extremity is positive for edema, 1+ pitting, without erythema, heat, or pain Skin: Skin color, texture, turgor normal. No rashes or lesions Neurologic: Grossly normal  LABORATORY DATA:  CBC    Component Value Date/Time   WBC 7.0 12/15/2014 0824   RBC 2.98* 12/15/2014 0824   RBC 2.52* 10/18/2014 0850   HGB 9.0* 12/28/2014 1445   HCT 27.9* 12/28/2014 1445   PLT 188 12/15/2014 0824   MCV 93.0 12/15/2014 0824   MCH 30.2 12/15/2014 0824   MCHC 32.5 12/15/2014 0824   RDW 15.8* 12/15/2014 0824   LYMPHSABS 0.8 12/06/2014 2130   MONOABS 0.4 12/06/2014 2130   EOSABS 0.1 12/06/2014 2130   BASOSABS 0.0 12/06/2014 2130      Chemistry      Component Value Date/Time   NA 134* 12/15/2014 0824   K 3.5 12/15/2014 0824   CL 99* 12/15/2014 0824   CO2 26 12/15/2014 0824   BUN 18 12/15/2014 0824   CREATININE 1.65* 12/15/2014 0824   CREATININE 1.17* 01/20/2013 1010      Component Value Date/Time   CALCIUM 8.5* 12/15/2014 0824   ALKPHOS 62 10/26/2014 0456   AST 13* 10/26/2014 0456   ALT 11* 10/26/2014 0456   BILITOT 0.9 10/26/2014 0456       PATHOLOGY:  None  ASSESSMENT:  1. Anemia of chronic disease with a definite element of anemia of chronic renal disease.  S/P 5-6 units of blood x 1 year. 2. Element of iron deficiency anemia, S/P IV Feraheme 510 mg on 4/29 and 12/01/2014. 3. Enterococcus bacteremia,  currently on IV antibiotics.   Patient Active Problem List   Diagnosis Date Noted  . Anemia of chronic disease 11/30/2014    Priority: High  . ICD (implantable cardioverter-defibrillator) infection   . Enterococcal bacteremia   . Diabetic feet   . Bacteremia 12/07/2014  . SOB (shortness of breath)   . Acute on chronic combined systolic and diastolic CHF (congestive heart failure) 12/01/2014  . COPD (chronic obstructive pulmonary disease) 11/30/2014  . Hypothyroidism 11/30/2014  . Fever 11/30/2014  . Symptomatic anemia 10/17/2014  . Implantable cardioverter-defibrillator-CRT- Mdt   . Atrial fibrillation 06/19/2013  . Chronic anticoagulation 08/19/2012  . Chronic combined systolic and diastolic CHF (congestive heart failure) 08/03/2012  . History of pulmonary embolism   . Type 2 diabetes, uncontrolled, with renal manifestation   . Tobacco abuse, in remission   . CKD (chronic kidney disease), stage IV   . LBBB 01/29/2009  . Dyslipidemia 06/04/2007  . HTN (hypertension) 06/04/2007     PLAN:  1. I personally reviewed and went over laboratory results with the patient.  The results are noted within this dictation. 2. I personally reviewed and went over radiographic studies with the patient.  The results are noted within this dictation.   3. Chart reviewed 4. Labs next week: CBC diff, CMET, iron/TIBC, ferritin. 5. Will start Aranesp at 60 mcg every 2 weeks. 6. Return in 4 weeks for follow-up.  All questions were answered. The patient knows to call the clinic with any problems, questions or concerns. We can certainly see the  patient much sooner if necessary.  Patient and plan discussed with Dr. Ancil Linsey and she is in agreement with the aforementioned.   This note is electronically signed by: Doy Mince  01/01/2015 12:30 PM   Patient seen and examined. Plan is detailed above. She has required a transfusion of several units of blood over the last several months. It  is felt she may have a component of GI related blood loss given iron deficiency (serum ferritin as low as 18 ng/ml) however an identifiable source has not been found. She also has significant chronic kidney disease, stage IV and certainly has a component of anemia of chronic kidney disease.  In regards to proceeding with a bone marrow biopsy I do not feel this is necessary currently. We will begin her on Aranesp twice monthly. We will closely monitor her counts and iron levels. We will plan on seeing her back in 4 weeks to review her tolerance. If she is interim problems or concerns I advised the patient to notify us. Risks and benefits of erythropoietin therapy have been discussed with the patient and she wishes to proceed. Plan is detailed above. Molli Hazard, MD

## 2015-01-01 NOTE — Patient Instructions (Addendum)
[facility] Discharge Instructions  RECOMMENDATIONS MADE BY THE CONSULTANT AND ANY TEST RESULTS WILL BE SENT TO YOUR REFERRING PHYSICIAN.  Exam and discussion today with Kirby Crigler, PA-C Return in one week for lab work and Aranesp injection.  Thank you for choosing [facility] to provide your oncology and hematology care.  To afford each patient quality time with our provider, please arrive at least 15 minutes before your scheduled appointment time.    You need to re-schedule your appointment should you arrive 10 or more minutes late.  We strive to give you quality time with our providers, and arriving late affects you and other patients whose appointments are after yours.  Also, if you no show three or more times for appointments you may be dismissed from the clinic at the providers discretion.     Again, thank you for choosing [facility].  Our hope is that these requests will decrease the amount of time that you wait before being seen by our physicians.       _____________________________________________________________  Should you have questions after your visit to [facility], please contact our office at [phone] between the hours of 8:30 a.m. and 4:30 p.m.  Voicemails left after 4:30 p.m. will not be returned until the following business day.  For prescription refill requests, have your pharmacy contact our office.

## 2015-01-02 ENCOUNTER — Ambulatory Visit (HOSPITAL_COMMUNITY)
Admission: RE | Admit: 2015-01-02 | Discharge: 2015-01-02 | Disposition: A | Discharge status: Home / Self Care | Payer: Commercial Managed Care - HMO | Source: Ambulatory Visit | Attending: Cardiology | Admitting: Cardiology

## 2015-01-02 ENCOUNTER — Encounter (HOSPITAL_COMMUNITY): Payer: Self-pay | Admitting: Oncology

## 2015-01-02 ENCOUNTER — Encounter (HOSPITAL_COMMUNITY): Payer: Self-pay

## 2015-01-02 VITALS — BP 108/50 | HR 64 | Wt 154.4 lb

## 2015-01-02 DIAGNOSIS — I480 Paroxysmal atrial fibrillation: Secondary | ICD-10-CM | POA: Diagnosis not present

## 2015-01-02 DIAGNOSIS — E785 Hyperlipidemia, unspecified: Secondary | ICD-10-CM | POA: Diagnosis not present

## 2015-01-02 DIAGNOSIS — I447 Left bundle-branch block, unspecified: Secondary | ICD-10-CM | POA: Insufficient documentation

## 2015-01-02 DIAGNOSIS — I429 Cardiomyopathy, unspecified: Secondary | ICD-10-CM | POA: Insufficient documentation

## 2015-01-02 DIAGNOSIS — R0683 Snoring: Secondary | ICD-10-CM | POA: Diagnosis not present

## 2015-01-02 DIAGNOSIS — Z7901 Long term (current) use of anticoagulants: Secondary | ICD-10-CM | POA: Diagnosis not present

## 2015-01-02 DIAGNOSIS — Z95810 Presence of automatic (implantable) cardiac defibrillator: Secondary | ICD-10-CM | POA: Insufficient documentation

## 2015-01-02 DIAGNOSIS — K219 Gastro-esophageal reflux disease without esophagitis: Secondary | ICD-10-CM | POA: Diagnosis not present

## 2015-01-02 DIAGNOSIS — D649 Anemia, unspecified: Secondary | ICD-10-CM | POA: Insufficient documentation

## 2015-01-02 DIAGNOSIS — Z79899 Other long term (current) drug therapy: Secondary | ICD-10-CM | POA: Diagnosis not present

## 2015-01-02 DIAGNOSIS — I5042 Chronic combined systolic (congestive) and diastolic (congestive) heart failure: Secondary | ICD-10-CM

## 2015-01-02 DIAGNOSIS — I5022 Chronic systolic (congestive) heart failure: Secondary | ICD-10-CM | POA: Insufficient documentation

## 2015-01-02 DIAGNOSIS — Z8249 Family history of ischemic heart disease and other diseases of the circulatory system: Secondary | ICD-10-CM | POA: Insufficient documentation

## 2015-01-02 DIAGNOSIS — Z87891 Personal history of nicotine dependence: Secondary | ICD-10-CM | POA: Diagnosis not present

## 2015-01-02 DIAGNOSIS — I129 Hypertensive chronic kidney disease with stage 1 through stage 4 chronic kidney disease, or unspecified chronic kidney disease: Secondary | ICD-10-CM | POA: Diagnosis not present

## 2015-01-02 DIAGNOSIS — N189 Chronic kidney disease, unspecified: Secondary | ICD-10-CM | POA: Diagnosis not present

## 2015-01-02 DIAGNOSIS — N184 Chronic kidney disease, stage 4 (severe): Secondary | ICD-10-CM | POA: Diagnosis not present

## 2015-01-02 DIAGNOSIS — E039 Hypothyroidism, unspecified: Secondary | ICD-10-CM | POA: Insufficient documentation

## 2015-01-02 DIAGNOSIS — G4733 Obstructive sleep apnea (adult) (pediatric): Secondary | ICD-10-CM | POA: Diagnosis not present

## 2015-01-02 LAB — COMPREHENSIVE METABOLIC PANEL
ALK PHOS: 87 U/L (ref 38–126)
ALT: 13 U/L — AB (ref 14–54)
AST: 15 U/L (ref 15–41)
Albumin: 2.8 g/dL — ABNORMAL LOW (ref 3.5–5.0)
Anion gap: 9 (ref 5–15)
BILIRUBIN TOTAL: 0.5 mg/dL (ref 0.3–1.2)
BUN: 34 mg/dL — ABNORMAL HIGH (ref 6–20)
CO2: 27 mmol/L (ref 22–32)
Calcium: 8.9 mg/dL (ref 8.9–10.3)
Chloride: 101 mmol/L (ref 101–111)
Creatinine, Ser: 2.04 mg/dL — ABNORMAL HIGH (ref 0.44–1.00)
GFR calc Af Amer: 27 mL/min — ABNORMAL LOW (ref >60)
GFR, EST NON AFRICAN AMERICAN: 23 mL/min — AB (ref >60)
Glucose, Bld: 131 mg/dL — ABNORMAL HIGH (ref 65–99)
POTASSIUM: 3.9 mmol/L (ref 3.5–5.1)
Sodium: 137 mmol/L (ref 135–145)
Total Protein: 7 g/dL (ref 6.5–8.1)

## 2015-01-02 LAB — DIGOXIN LEVEL: Digoxin Level: 0.6 ng/mL — ABNORMAL LOW (ref 0.8–2.0)

## 2015-01-02 LAB — TSH: TSH: 1.959 u[IU]/mL (ref 0.350–4.500)

## 2015-01-02 MED ORDER — SPIRONOLACTONE 25 MG PO TABS: 12.5000 mg | ORAL_TABLET | Freq: Every day | ORAL | Status: DC

## 2015-01-02 NOTE — Patient Instructions (Addendum)
Routine lab work today. Will notify you of abnormal results, otherwise no news is good news!  Will send order for [facility] to repeat labs at home in 2 weeks.  START Spironolactone 12.5 mg (1/2 tablet) once daily. Rx sent to [facility].  Will refer you to [facility] for a split night sleep study. [address], [place] [phone]  Follow up 1 month.  Do the following things EVERYDAY: 1) Weigh yourself in the morning before breakfast. Write it down and keep it in a log. 2) Take your medicines as prescribed 3) Eat low salt foods-Limit salt (sodium) to 2000 mg per day.  4) Stay as active as you can everyday 5) Limit all fluids for the day to less than 2 liters

## 2015-01-02 NOTE — Progress Notes (Signed)
Patient ID: [name], female   DOB: 1946/06/13, 69 y.o.   MRN: [id number] PCP: Dr. Karie Kirks Cardiology: Dr. Lovena Le  69 yo with history of chronic systolic CHF from nonischemic cardiomyopathy, prior PE, CKD, and paroxysmal atrial fibrillation presents for CHF clinic followup.  She has a long history of cardiomyopathy, dating back to 1999.  At that time, she had coronary angiography showing no significant disease. She was initially followed by Dr Lattie Haw, later by Dr. Lovena Le. She had her initial CRT-D device in 2005.  This was removed in 2007 due to enterococcal bacteremia with vegetation.  She had Medtronic CRT- D device placed in 8/14.  She has had trouble with paroxysmal atrial fibrillation, and she was put on amiodarone and cardioverted in 12/14.  TEE in 12/14 showed EF 15% but RV appeared normal.   Had RHC on 04/18/14 for possible low output. This showed volume depletion with normal cardiac output. Lasix held for a few days then cut back from 80 bid to 40 bid. Lisinopril also held but has been restarted.   Admitted with anemia 6/9 through 12/02/2014. Received 1UPRBCs. Diuresed with IV lasix. She had follow up with nephrology for Epogen injections. Discharge weight was 152 pounds.   She was admitted later in 6/16, this time with enterococcal bacteremia and pacemaker lead vegetation by TEE.  Her CRT-D device was again extracted.  Her device remains out and she is still getting antibiotics via PICC.   She returns for post hospital follow up. She says that she feels about the same as prior to device removal.  She is generally not very active and uses a walker around the house.  No dyspnea walking in the house.  Some dyspnea with fast walking.  No orthopnea/PND.  She is generally fatigued and sleepy during the day.  No BRBPR or melena.     Labs (7/15): K 3.5, creatinine 1.78, hemoglobin 9.4,m BNP 24607 Labs (10/15): K 4.7, creatinine 2.3, digoxin 0.7, BNP 8376, HCT 30.9 Labs (04/18/14): K 4.3  creatinine 2.8  Labs (11/15): K 5.3, creatinine 1.9 Labs (10/26/2014) K 3.7 Creatinine 1.94 Hgb 9.3, LFTs normal Labs (11/2014): K 3.6 Creatinine 1.80=>1.65, digoxin 0.5 Labs (7/16): hgb 9  ECG: NSR, LBBB   PMH:  1. Chronic systolic CHF: Nonischemic cardiomyopathy.  1999 had LHC with normal coronaries.  12/05 had Guidant BiV ICD placed. 5/07 had enterococcal bacteremia with ICD lead vegetation, device extracted.  8/14 had placement of Medtronic BiV ICD device.  Cardiolite (12/09) with multiple wall segments showing scarring, some inferior ischemia.  Echo (6/14) with EF 15-20%, diffuse hypokinesis with some regionality, normal RV size and systolic function.  TEE (12/14) with EF 15%, normal RV size and systolic function. RHC (10/15) with mean RA 1, PA 27/8, mean PCWP 5, CI 2.71. Enterococcal bacteremia 6/16 with CRT-D extraction.  Echo (4/16) with EF 15-20%, moderate LVH, diffuse hypokinesis, mild-moderate AI, mild MR, PASP 33 mmHg.  2. PE: 2/07 after right THR.  3. Right THR 4. LBBB 5. GERD 6. Hyperlipidemia 7. HTN 8. Anemia: 12/14 had EGD that was normal, colonoscopy with 3 polyps removed.  9. CKD 10. OSA: Mild, did not tolerate OSA 11. Atrial fibrillation: Paroxysmal.  TEE-guided DCCV in 2/14, TEE-guided DCCV in 12/14 on amiodarone.  12. Enterococcal bacteremia with CRT-D device removal in 5/07 then again in 6/16.    SH: Married, lives in [place], previous smoker.   FH: Mother with MI.   ROS: All systems reviewed and negative except as per HPI.  Current Outpatient Prescriptions  Medication Sig Dispense Refill  . ACCU-CHEK AVIVA PLUS test strip     . amiodarone (PACERONE) 200 MG tablet Take 1 tablet (200 mg total) by mouth daily. 90 tablet 3  . Ampicillin-Sulbactam 3 g in sodium chloride 0.9 % 100 mL Inject 3 g into the vein every 8 (eight) hours. 3 g 6  . carvedilol (COREG) 3.125 MG tablet Take 3 tablets (9.375 mg total) by mouth 2 (two) times daily with a meal. 180 tablet 2  .  cefTRIAXone (ROCEPHIN) 40 MG/ML IVPB Inject 50 mLs (2 g total) into the vein every 12 (twelve) hours. 50 mL 6  . cetirizine (ZYRTEC) 10 MG tablet Take 10 mg by mouth daily as needed for allergies.     Marland Kitchen colchicine 0.6 MG tablet Take 0.6 mg by mouth 2 (two) times daily.     . digoxin (LANOXIN) 0.125 MG tablet Take 0.5 tablets (0.0625 mg total) by mouth every other day. 30 tablet 3  . feeding supplement, ENSURE ENLIVE, (ENSURE ENLIVE) LIQD Take 237 mLs by mouth 2 (two) times daily between meals. 237 mL 12  . ferrous sulfate 324 (65 FE) MG TBEC Take 1 tablet (325 mg total) by mouth 2 (two) times daily. 60 tablet   . furosemide (LASIX) 40 MG tablet TAKE 1 AND 1/2 TABLETS BY MOUTH TWICE DAILY. 90 tablet 3  . glimepiride (AMARYL) 2 MG tablet Take 2 mg by mouth daily.    . isosorbide mononitrate (IMDUR) 30 MG 24 hr tablet Take 1 tablet (30 mg total) by mouth daily. 30 tablet 0  . levothyroxine (SYNTHROID, LEVOTHROID) 25 MCG tablet Take 1 tablet (25 mcg total) by mouth daily before breakfast. 30 tablet 0  . lisinopril (PRINIVIL,ZESTRIL) 5 MG tablet Take 5 mg by mouth daily.    . metoCLOPramide (REGLAN) 5 MG tablet Take 5 mg by mouth 4 (four) times daily.    . ondansetron (ZOFRAN) 4 MG tablet Take 4 mg by mouth every 4 (four) hours as needed for nausea or vomiting.    Marland Kitchen oxyCODONE-acetaminophen (PERCOCET) 10-325 MG per tablet Take 1 tablet by mouth every 6 (six) hours as needed for pain.     . polyethylene glycol powder (GLYCOLAX/MIRALAX) powder Take 1 Container by mouth daily as needed for mild constipation.     . pravastatin (PRAVACHOL) 40 MG tablet Take 80 mg by mouth at bedtime.     . sodium chloride 0.9 % infusion     . warfarin (COUMADIN) 2.5 MG tablet Take 0.5 tablets (1.25 mg total) by mouth daily at 6 PM. 10 tablet 0  . spironolactone (ALDACTONE) 25 MG tablet Take 0.5 tablets (12.5 mg total) by mouth daily. 45 tablet 3   No current facility-administered medications for this encounter.    BP  108/50 mmHg  Pulse 64  Wt 154 lb 6.4 oz (70.035 kg)  SpO2 96% General: NAD. Husband present.  Neck: JVP 5-6 cm, no thyromegaly or thyroid nodule.  Lungs: Clear to auscultation bilaterally with normal respiratory effort. CV: Nondisplaced PMI.  Heart regular S1/S2, no S3/S4, 1/6 early SEM RUSB.  Trace ankle edema.  No carotid bruit.  Normal pedal pulses.  Abdomen: Soft, nontender, no hepatosplenomegaly, no distention.  Skin: Intact without lesions or rashes.  Neurologic: Alert and oriented x 3.  Psych: Normal affect. Extremities: No clubbing or cyanosis.   HEENT: Normal.   Assessment/Plan: 1. Chronic systolic CHF: Patient has a nonischemic cardiomyopathy. Echo EF 15-20% on 5/16 echo.  Rosine (10/15)  showed low filling pressures and normal cardiac output.She is unable to do CPX testing and probably would not be a good LVAD candidate.  NYHA II-III symptoms but not very active. Volume status stable. She had removal of CRT-D system for the 2nd time due to endocarditis in 6/16, symptoms seem about the same.  - Continue current dose of lasix.  - Continue current lisinopril and Coreg.  - Digoxin every other day, check level today.  - Add spironolactone 12.5 mg daily with BMET in 2 wks.  - Would probably not replace CRT-D system at this point given 2 episodes of endocarditis now with device explantation.  2. Atrial fibrillation: Paroxysmal, in NSR.   - Continue amiodarone for now.  Check LFTs today, should have regular eye exams.  Hypothyroid getting Levoxyl.  - She has been on coumadin with history of anemia. No definite GI bleeding source noted.Goal 2-2.5. Followed by PCP.  3. CKD: Follow creatinine closely.   4. Anemia: She had EGD and c-scope in 12/14. In 12/15, she had transfusion but apparently no-showed for her GI workup. Seen by hematology earlier this year, suspect normocytic anemia is most likely due to combination of CKD and hypothyroidism, less likely MDS. Erythropoeitin started.  She has had 5 units PRBCs in 5/16 and 1 unit in 6/16. Per GI: Iron deficiency anemia with blood in stool, few scattered lymphangiectasias noted on the small bowel capsule study but no source of blood loss identified. Small lesions could be missed as she had some debris in the small bowel. Continue aranesp injections every 3 weeks.  If she has recurrent bleed can consider stopping coumadin.  5. OSA: Significant daytime sleepiness.  I will arrange for a sleep study.   Followup in 1 month.    Loralie Champagne 01/02/2015

## 2015-01-03 ENCOUNTER — Encounter: Payer: Self-pay | Admitting: *Deleted

## 2015-01-03 ENCOUNTER — Other Ambulatory Visit: Payer: Self-pay | Admitting: *Deleted

## 2015-01-03 NOTE — Patient Outreach (Signed)
[facility]) Care Management   01/03/2015  [name] 12-18-1945 [id number]  [name] is an 69 y.o. female  Subjective:   Patient reporting she is doing good today, she states she saw Dr. Aundra Dubin, cardiologist yesterday, she reports that he told her she was doing well. He did give new medication-Spironolactone Spouse is assisting patient with IV antibiotics for the infective endocarditis, states there is approximately 3 more weeks of medication doses. Patient has been set up to get sleep study early August. Patient also [facility] to [facility] what patient spouse thinks is in injection to "build up her blood".  Patient has f/u appointment with Dr. Linus Salmons, who is infectious disease MD following her infective endocarditis.   Patient states this is the second time she has gotten an infection from a defibrillator and had to have the device removed, plan is to not replace the defibrillator as she has "just as much chance with one as without one".  Patient still getting [facility] services, PT was there at end of [facility] visit.   Goal per spouse "to finish up antibiotics"  Objective:   BP 100/50 mmHg  Pulse 60  Resp 20  Ht 1.651 m (5\' 5" )  Wt 150 lb (68.04 kg)  BMI 24.96 kg/m2  SpO2 98% Review of Systems  Constitutional: Negative.   Eyes: Negative.   Respiratory: Negative.   Cardiovascular: Negative.   Gastrointestinal: Negative.   Genitourinary: Negative.   Musculoskeletal: Negative.   Neurological: Negative.   Endo/Heme/Allergies: Negative.   Psychiatric/Behavioral: Negative.     Physical Exam  Constitutional: She is oriented to person, place, and time. She appears well-developed and well-nourished.  Cardiovascular: Normal rate.   Respiratory: Effort normal.  GI: Soft.  Musculoskeletal: Normal range of motion.  Neurological: She is alert and oriented to person, place, and time.  Skin: Skin is warm.       Current Medications:   Current  Outpatient Prescriptions  Medication Sig Dispense Refill  . amiodarone (PACERONE) 200 MG tablet Take 1 tablet (200 mg total) by mouth daily. 90 tablet 3  . Ampicillin-Sulbactam 3 g in sodium chloride 0.9 % 100 mL Inject 3 g into the vein every 8 (eight) hours. 3 g 6  . carvedilol (COREG) 3.125 MG tablet Take 3 tablets (9.375 mg total) by mouth 2 (two) times daily with a meal. 180 tablet 2  . cefTRIAXone (ROCEPHIN) 40 MG/ML IVPB Inject 50 mLs (2 g total) into the vein every 12 (twelve) hours. 50 mL 6  . cetirizine (ZYRTEC) 10 MG tablet Take 10 mg by mouth daily as needed for allergies.     Marland Kitchen digoxin (LANOXIN) 0.125 MG tablet Take 0.5 tablets (0.0625 mg total) by mouth every other day. 30 tablet 3  . feeding supplement, ENSURE ENLIVE, (ENSURE ENLIVE) LIQD Take 237 mLs by mouth 2 (two) times daily between meals. 237 mL 12  . ferrous sulfate 324 (65 FE) MG TBEC Take 1 tablet (325 mg total) by mouth 2 (two) times daily. 60 tablet   . furosemide (LASIX) 40 MG tablet TAKE 1 AND 1/2 TABLETS BY MOUTH TWICE DAILY. 90 tablet 3  . glimepiride (AMARYL) 2 MG tablet Take 2 mg by mouth daily.    . isosorbide mononitrate (IMDUR) 30 MG 24 hr tablet Take 1 tablet (30 mg total) by mouth daily. 30 tablet 0  . levothyroxine (SYNTHROID, LEVOTHROID) 25 MCG tablet Take 1 tablet (25 mcg total) by mouth daily before breakfast.  30 tablet 0  . lisinopril (PRINIVIL,ZESTRIL) 5 MG tablet Take 5 mg by mouth daily.    . metoCLOPramide (REGLAN) 5 MG tablet Take 5 mg by mouth 4 (four) times daily.    . ondansetron (ZOFRAN) 4 MG tablet Take 4 mg by mouth every 4 (four) hours as needed for nausea or vomiting.    Marland Kitchen oxyCODONE-acetaminophen (PERCOCET) 10-325 MG per tablet Take 1 tablet by mouth every 6 (six) hours as needed for pain.     . polyethylene glycol powder (GLYCOLAX/MIRALAX) powder Take 1 Container by mouth daily as needed for mild constipation.     . pravastatin (PRAVACHOL) 40 MG tablet Take 80 mg by mouth at bedtime.     .  sodium chloride 0.9 % infusion     . spironolactone (ALDACTONE) 25 MG tablet Take 0.5 tablets (12.5 mg total) by mouth daily. 45 tablet 3  . warfarin (COUMADIN) 2.5 MG tablet Take 0.5 tablets (1.25 mg total) by mouth daily at 6 PM. 10 tablet 0  . ACCU-CHEK AVIVA PLUS test strip     . colchicine 0.6 MG tablet Take 0.6 mg by mouth 2 (two) times daily.      No current facility-administered medications for this visit.    Functional Status:   In your present state of health, do you have any difficulty performing the following activities: 01/03/2015 12/07/2014  Hearing? N N  Vision? N N  Difficulty concentrating or making decisions? N N  Walking or climbing stairs? N Y  Dressing or bathing? N N  Doing errands, shopping? Tempie Donning  Preparing Food and eating ? Y -  Using the Toilet? N -  In the past six months, have you accidently leaked urine? Y -  Do you have problems with loss of bowel control? N -  Managing your Medications? Y -  Managing your Finances? N -  Housekeeping or managing your Housekeeping? Y -    Fall/Depression Screening:    Fall Risk  12/28/2014  Falls in the past year? Yes  Number falls in past yr: 1  Injury with Fall? No  Risk for fall due to : History of fall(s);Impaired mobility  Follow up Falls evaluation completed   PHQ 2/9 Scores 12/28/2014  PHQ - 2 Score 0    Assessment:   Assessment completed with patient and spouse. Reviewed new medication, education given Needs to continue [facility] services Needs to keep upcoming appointments Needs to continue IV antibiotics Will monitor BP and keep a log  Plan:  Patient spouse will monitor BP daily and report low readings to MD Plan to continue [facility] program Plan to visit again 01/31/15  [facility] E. Laymond Purser, RN, BSN, [place] [phone]   [facility] CM Care Plan Problem One        Patient Outreach from 01/03/2015 in [place] Problem One  Inpatient Hospitalzation for infective  endocarditis   Care Plan for Problem One  Active   [facility] Long Term Goal (31-90 days)  Patient will not have hospitalization for infective endocarditis in the next 90 days   [facility] Long Term Goal Start Date  12/18/14   Interventions for Problem One Long Term Goal  Using teachback method started patient in transition of care calls, verified services in place, verified appointments in place, reviewed signs and symptoms to report   [facility] CM Short Term Goal #1 (0-30 days)  Patient will not a rehospitalization for infective endocarditis in the next 30  days   [facility] CM Short Term Goal #1 Start Date  12/18/14   Interventions for Short Term Goal #1  Using teachback method again reviewed signs and symptoms of infection to report to MD   [facility] CM Short Term Goal #2 (0-30 days)  Patient will take all of her prescribed antibiotics over the next 6 weeks   [facility] CM Short Term Goal #2 Start Date  12/11/14   Interventions for Short Term Goal #2  Using teachback method reviewed with patient and spouse IV medication regimen     [facility] CM Care Plan Problem Two        Patient Outreach from 01/03/2015 in [place] Problem Two  Patient Blood pressure running low as evidenced by VS readings   Care Plan for Problem Two  Active   Interventions for Problem Two Long Term Goal   Using teachback method reviewed use of automatic BP cuff with patient and spouse, reviewed when to report BP to MD if too low or high and to instructed to take readings to appointments   [facility] Long Term Goal (31-90) days  Patient BP will be within normal limits over the next 90 days   [facility] Long Term Goal Start Date  01/03/15   [facility] CM Short Term Goal #1 (0-30 days)  Patient spouse will monitor and record BP readings over the next 30 days   [facility] CM Short Term Goal #1 Start Date  01/03/15   Interventions for Short Term Goal #2   Gave [facility] calendar reviewed wehre to document BP readings

## 2015-01-05 ENCOUNTER — Other Ambulatory Visit (HOSPITAL_COMMUNITY): Payer: Self-pay

## 2015-01-05 ENCOUNTER — Telehealth (HOSPITAL_COMMUNITY): Payer: Self-pay | Admitting: *Deleted

## 2015-01-05 DIAGNOSIS — D638 Anemia in other chronic diseases classified elsewhere: Secondary | ICD-10-CM

## 2015-01-05 MED ORDER — FUROSEMIDE 40 MG PO TABS: 40.0000 mg | ORAL_TABLET | Freq: Two times a day (BID) | ORAL | Status: DC

## 2015-01-05 NOTE — Telephone Encounter (Signed)
-----   Message from Larey Dresser, MD sent at 01/04/2015 12:40 AM EDT ----- Would have her decrease Lasix to 40 mg bid, repeat BMET 2 wks.

## 2015-01-05 NOTE — Telephone Encounter (Signed)
Notes Recorded by Scarlette Calico, RN on 01/05/2015 at 5:01 PM Pt and husband aware and agreeable, order sent to [facility] to draw bmet in 2 weeks

## 2015-01-07 ENCOUNTER — Inpatient Hospital Stay (HOSPITAL_COMMUNITY)
Admission: EM | Admit: 2015-01-07 | Discharge: 2015-01-10 | DRG: 193 | Disposition: A | Discharge status: Home Health | Payer: Commercial Managed Care - HMO | Attending: Family Medicine | Admitting: Family Medicine

## 2015-01-07 ENCOUNTER — Encounter (HOSPITAL_COMMUNITY): Payer: Self-pay | Admitting: *Deleted

## 2015-01-07 ENCOUNTER — Emergency Department (HOSPITAL_COMMUNITY): Payer: Commercial Managed Care - HMO

## 2015-01-07 DIAGNOSIS — J189 Pneumonia, unspecified organism: Secondary | ICD-10-CM | POA: Diagnosis not present

## 2015-01-07 DIAGNOSIS — R7881 Bacteremia: Secondary | ICD-10-CM | POA: Diagnosis present

## 2015-01-07 DIAGNOSIS — Y95 Nosocomial condition: Secondary | ICD-10-CM | POA: Diagnosis present

## 2015-01-07 DIAGNOSIS — Z801 Family history of malignant neoplasm of trachea, bronchus and lung: Secondary | ICD-10-CM

## 2015-01-07 DIAGNOSIS — I129 Hypertensive chronic kidney disease with stage 1 through stage 4 chronic kidney disease, or unspecified chronic kidney disease: Secondary | ICD-10-CM | POA: Diagnosis present

## 2015-01-07 DIAGNOSIS — B952 Enterococcus as the cause of diseases classified elsewhere: Secondary | ICD-10-CM

## 2015-01-07 DIAGNOSIS — Z96641 Presence of right artificial hip joint: Secondary | ICD-10-CM | POA: Diagnosis present

## 2015-01-07 DIAGNOSIS — Z8249 Family history of ischemic heart disease and other diseases of the circulatory system: Secondary | ICD-10-CM

## 2015-01-07 DIAGNOSIS — N189 Chronic kidney disease, unspecified: Secondary | ICD-10-CM

## 2015-01-07 DIAGNOSIS — K219 Gastro-esophageal reflux disease without esophagitis: Secondary | ICD-10-CM | POA: Diagnosis present

## 2015-01-07 DIAGNOSIS — E1129 Type 2 diabetes mellitus with other diabetic kidney complication: Secondary | ICD-10-CM | POA: Diagnosis present

## 2015-01-07 DIAGNOSIS — N184 Chronic kidney disease, stage 4 (severe): Secondary | ICD-10-CM | POA: Diagnosis present

## 2015-01-07 DIAGNOSIS — D631 Anemia in chronic kidney disease: Secondary | ICD-10-CM | POA: Diagnosis present

## 2015-01-07 DIAGNOSIS — I5042 Chronic combined systolic (congestive) and diastolic (congestive) heart failure: Secondary | ICD-10-CM | POA: Diagnosis present

## 2015-01-07 DIAGNOSIS — E1165 Type 2 diabetes mellitus with hyperglycemia: Secondary | ICD-10-CM | POA: Diagnosis present

## 2015-01-07 DIAGNOSIS — Z87891 Personal history of nicotine dependence: Secondary | ICD-10-CM

## 2015-01-07 DIAGNOSIS — G4733 Obstructive sleep apnea (adult) (pediatric): Secondary | ICD-10-CM | POA: Diagnosis present

## 2015-01-07 DIAGNOSIS — M1990 Unspecified osteoarthritis, unspecified site: Secondary | ICD-10-CM | POA: Diagnosis present

## 2015-01-07 DIAGNOSIS — E118 Type 2 diabetes mellitus with unspecified complications: Secondary | ICD-10-CM | POA: Diagnosis present

## 2015-01-07 DIAGNOSIS — Z833 Family history of diabetes mellitus: Secondary | ICD-10-CM

## 2015-01-07 DIAGNOSIS — Z7901 Long term (current) use of anticoagulants: Secondary | ICD-10-CM

## 2015-01-07 DIAGNOSIS — I429 Cardiomyopathy, unspecified: Secondary | ICD-10-CM | POA: Diagnosis present

## 2015-01-07 DIAGNOSIS — E785 Hyperlipidemia, unspecified: Secondary | ICD-10-CM | POA: Diagnosis present

## 2015-01-07 DIAGNOSIS — E1122 Type 2 diabetes mellitus with diabetic chronic kidney disease: Secondary | ICD-10-CM | POA: Diagnosis present

## 2015-01-07 DIAGNOSIS — IMO0002 Reserved for concepts with insufficient information to code with codable children: Secondary | ICD-10-CM | POA: Diagnosis present

## 2015-01-07 DIAGNOSIS — D509 Iron deficiency anemia, unspecified: Secondary | ICD-10-CM | POA: Diagnosis present

## 2015-01-07 DIAGNOSIS — Z86711 Personal history of pulmonary embolism: Secondary | ICD-10-CM | POA: Diagnosis not present

## 2015-01-07 DIAGNOSIS — J9601 Acute respiratory failure with hypoxia: Secondary | ICD-10-CM | POA: Diagnosis present

## 2015-01-07 DIAGNOSIS — D638 Anemia in other chronic diseases classified elsewhere: Secondary | ICD-10-CM | POA: Diagnosis present

## 2015-01-07 DIAGNOSIS — I480 Paroxysmal atrial fibrillation: Secondary | ICD-10-CM | POA: Diagnosis present

## 2015-01-07 LAB — COMPREHENSIVE METABOLIC PANEL
ALT: 12 U/L — ABNORMAL LOW (ref 14–54)
AST: 14 U/L — ABNORMAL LOW (ref 15–41)
Albumin: 3.3 g/dL — ABNORMAL LOW (ref 3.5–5.0)
Alkaline Phosphatase: 85 U/L (ref 38–126)
Anion gap: 10 (ref 5–15)
BUN: 35 mg/dL — ABNORMAL HIGH (ref 6–20)
CALCIUM: 8.7 mg/dL — AB (ref 8.9–10.3)
CHLORIDE: 102 mmol/L (ref 101–111)
CO2: 26 mmol/L (ref 22–32)
Creatinine, Ser: 1.7 mg/dL — ABNORMAL HIGH (ref 0.44–1.00)
GFR calc Af Amer: 34 mL/min — ABNORMAL LOW (ref >60)
GFR, EST NON AFRICAN AMERICAN: 29 mL/min — AB (ref >60)
Glucose, Bld: 193 mg/dL — ABNORMAL HIGH (ref 65–99)
Potassium: 3.8 mmol/L (ref 3.5–5.1)
SODIUM: 138 mmol/L (ref 135–145)
Total Bilirubin: 0.8 mg/dL (ref 0.3–1.2)
Total Protein: 7.4 g/dL (ref 6.5–8.1)

## 2015-01-07 LAB — CBC WITH DIFFERENTIAL/PLATELET
Basophils Absolute: 0 K/uL (ref 0.0–0.1)
Basophils Relative: 0 % (ref 0–1)
Eosinophils Absolute: 0.1 K/uL (ref 0.0–0.7)
Eosinophils Relative: 1 % (ref 0–5)
HCT: 26.2 % — ABNORMAL LOW (ref 36.0–46.0)
Hemoglobin: 8.8 g/dL — ABNORMAL LOW (ref 12.0–15.0)
Lymphocytes Relative: 5 % — ABNORMAL LOW (ref 12–46)
Lymphs Abs: 0.5 K/uL — ABNORMAL LOW (ref 0.7–4.0)
MCH: 32 pg (ref 26.0–34.0)
MCHC: 33.6 g/dL (ref 30.0–36.0)
MCV: 95.3 fL (ref 78.0–100.0)
Monocytes Absolute: 0.6 K/uL (ref 0.1–1.0)
Monocytes Relative: 6 % (ref 3–12)
Neutro Abs: 9 K/uL — ABNORMAL HIGH (ref 1.7–7.7)
Neutrophils Relative %: 88 % — ABNORMAL HIGH (ref 43–77)
Platelets: 200 K/uL (ref 150–400)
RBC: 2.75 MIL/uL — ABNORMAL LOW (ref 3.87–5.11)
RDW: 16.9 % — ABNORMAL HIGH (ref 11.5–15.5)
WBC: 10.1 K/uL (ref 4.0–10.5)

## 2015-01-07 LAB — LACTIC ACID, PLASMA: Lactic Acid, Venous: 0.9 mmol/L (ref 0.5–2.0)

## 2015-01-07 MED ORDER — DEXTROSE 5 % IV SOLN
500.0000 mg | Freq: Once | INTRAVENOUS | Status: DC
  Filled 2015-01-07: qty 500

## 2015-01-07 MED ORDER — DEXTROSE 5 % IV SOLN
1.0000 g | Freq: Once | INTRAVENOUS | Status: AC
  Administered 2015-01-07: 1 g via INTRAVENOUS
  Filled 2015-01-07: qty 10

## 2015-01-07 MED ORDER — SODIUM CHLORIDE 0.9 % IV BOLUS (SEPSIS)
500.0000 mL | Freq: Once | INTRAVENOUS | Status: AC
  Administered 2015-01-07: 21:00:00 via INTRAVENOUS

## 2015-01-07 MED ORDER — VANCOMYCIN HCL IN DEXTROSE 1-5 GM/200ML-% IV SOLN
1000.0000 mg | Freq: Once | INTRAVENOUS | Status: AC
  Administered 2015-01-07: 1000 mg via INTRAVENOUS
  Filled 2015-01-07: qty 200

## 2015-01-07 NOTE — H&P (Signed)
[name] is an 69 y.o. female.    Newt Minion (pcp)  Chief Complaint: dyspnea HPI: 69 yo female with hx of PE, Cardiomyopathy, EF 15%, Pafib (chads2= ), apparently c/o cough and dyspnea starting today.  Subjective fever as well.  Pt presented to ED for above complaints and found to have pneumonia.  Pt will be admitted for hcap  Past Medical History  Diagnosis Date  . Cardiomyopathy, nonischemic     a. 1999 nl cath;  b. 12/05 Guidant [place];  c. 10/2005 ICD extraction 2/2 enterococcus bacteremia and Veg on RV lead;  c. 05/2008 low risk Myoview (scarring w/ some evidence of inf ischemia);  d. 11/2012 Echo: EF 15-20%;  e. 01/2013 s/p MDT [place] [facility] CRT D, ser # [id number];  f. 05/2013 Echo: EF 15%.  . Enterococcal infection     a. 10/2005 - AICD-explanted  . Pulmonary embolism     a. 07/2005 after total right hip arthroplasty  . Hilar density     a. infrahilar mass/adenopathy on CT scan 5/07; subsequently  resolved  . LBBB (left bundle branch block)   . GERD (gastroesophageal reflux disease)   . Hyperlipidemia   . Hypertension   . Tobacco abuse     a. discontinued in 1997, and then resumed  . Urinary incontinence   . Anemia     a. mild/chronic  . Villous adenoma of colon     a. tubovillous adenomatous polyp with focal high grade dysplasia; presented with hematochezia - followed by Dr. Laural Golden.  . Implantable cardioverter-defibrillator-CRT- Mdt     a.  01/2013 s/p MDT [place] [facility] CRT D, ser # [id number]  . Chronic systolic CHF (congestive heart failure)     a. 11/2012 Echo: EF 15-20%;  b. 05/2013 TEE EF 15%.  . Pneumonia 07/2005  . Obstructive sleep apnea     a. mild-did not tolerate CPAP (01/24/2013)  . History of blood transfusion   . Degenerative joint disease     of knees, shoulder, and hips  . PAF (paroxysmal atrial fibrillation)     a. 07/2012 s/p TEE/DCCV;  b. chronic coumadin;  c. 05/2013 Recurrent Afib->TEE/DCCV and amio initiation.  . CKD (chronic kidney disease), stage III      creatinin-1.44 in 1/09; 1.51 in 1/10  . Type II diabetes mellitus     type 2    Past Surgical History  Procedure Laterality Date  . Pacemaker removal  11/18/05    Enterococcal infection  . Total hip arthroplasty Right 07/2005  . Knee arthroscopy Right 1980's?  . Colonoscopy w/ polypectomy  2009  . Cardioversion N/A 08/20/2012    Procedure: TEE GUIDED CARDIOVERSION;  Surgeon: Yehuda Savannah, MD;  Location: [facility];  Service: Cardiovascular;  Laterality: N/A;  To be done @ bedside  . Tee without cardioversion N/A 08/20/2012    Procedure: TRANSESOPHAGEAL ECHOCARDIOGRAM (TEE);  Surgeon: Yehuda Savannah, MD;  Location: [facility];  Service: Cardiovascular;  Laterality: N/A;  . Bi-ventricular implantable cardioverter defibrillator  (crt-d)  01/24/2013  . A-v cardiac pacemaker insertion  12/05    Biventricular pacemaker/AICD  . Abdominal hysterectomy  1990/92    Initial partial hysterectomy followed by BSO  . Tubal ligation  1980's  . Cardiac catheterization    . Colonoscopy with esophagogastroduodenoscopy (egd) N/A 06/10/2013    Procedure: COLONOSCOPY WITH ESOPHAGOGASTRODUODENOSCOPY (EGD);  Surgeon: Rogene Houston, MD;  Location: [facility];  Service: Endoscopy;  Laterality: N/A;  925  . Tee without cardioversion N/A 06/20/2013  Procedure: TRANSESOPHAGEAL ECHOCARDIOGRAM (TEE);  Surgeon: Dorothy Spark, MD;  Location: [place];  Service: Cardiovascular;  Laterality: N/A;  . Cardioversion N/A 06/20/2013    Procedure: CARDIOVERSION;  Surgeon: Dorothy Spark, MD;  Location: [facility]COPY;  Service: Cardiovascular;  Laterality: N/A;  . Bi-ventricular implantable cardioverter defibrillator N/A 01/24/2013    Procedure: BI-VENTRICULAR IMPLANTABLE CARDIOVERTER DEFIBRILLATOR  (CRT-D);  Surgeon: Evans Lance, MD;  Location: [facility] CATH LAB;  Service: Cardiovascular;  Laterality: N/A;  . Right heart catheterization N/A 04/18/2014    Procedure: RIGHT HEART CATH;  Surgeon: Larey Dresser, MD;   Location: [facility] CATH LAB;  Service: Cardiovascular;  Laterality: N/A;  . Esophagogastroduodenoscopy N/A 10/21/2014    Procedure: ESOPHAGOGASTRODUODENOSCOPY (EGD);  Surgeon: Inda Castle, MD;  Location: [place];  Service: Endoscopy;  Laterality: N/A;  . Colonoscopy Left 10/24/2014    Procedure: COLONOSCOPY;  Surgeon: Carol Ada, MD;  Location: [facility] ENDOSCOPY;  Service: Endoscopy;  Laterality: Left;  Freda Munro capsule study N/A 10/24/2014    Procedure: GIVENS CAPSULE STUDY;  Surgeon: Carol Ada, MD;  Location: [facility] ENDOSCOPY;  Service: Endoscopy;  Laterality: N/A;  . Tee without cardioversion N/A 12/08/2014    Procedure: TRANSESOPHAGEAL ECHOCARDIOGRAM (TEE);  Surgeon: Fay Records, MD;  Location: [facility];  Service: Cardiovascular;  Laterality: N/A;  . Icd lead removal N/A 12/13/2014    Procedure: ICD LEAD REMOVAL/EXTRACTION ;  Surgeon: Evans Lance, MD;  Location: [facility] OR;  Service: Cardiovascular;  Laterality: N/A;  Bartle back up    Family History  Problem Relation Age of Onset  . Hypertension Mother   . Diabetes Mother   . Coronary artery disease Father   . Diabetes Brother   . Hypertension Brother   . Lung cancer Brother   . Arthritis Other   . Diabetes Other   . Heart disease Other     female < 36   Social History:  reports that she has quit smoking. Her smoking use included Cigarettes. She quit after 12 years of use. She has never used smokeless tobacco. She reports that she does not drink alcohol or use illicit drugs.  Allergies: No Known Allergies   (Not in a hospital admission)  Results for orders placed or performed during the hospital encounter of 01/07/15 (from the past 48 hour(s))  CBC with Differential     Status: Abnormal   Collection Time: 01/07/15  8:48 PM  Result Value Ref Range   WBC 10.1 4.0 - 10.5 K/uL   RBC 2.75 (L) 3.87 - 5.11 MIL/uL   Hemoglobin 8.8 (L) 12.0 - 15.0 g/dL   HCT 26.2 (L) 36.0 - 46.0 %   MCV 95.3 78.0 - 100.0 fL   MCH 32.0 26.0 - 34.0 pg    MCHC 33.6 30.0 - 36.0 g/dL   RDW 16.9 (H) 11.5 - 15.5 %   Platelets 200 150 - 400 K/uL   Neutrophils Relative % 88 (H) 43 - 77 %   Neutro Abs 9.0 (H) 1.7 - 7.7 K/uL   Lymphocytes Relative 5 (L) 12 - 46 %   Lymphs Abs 0.5 (L) 0.7 - 4.0 K/uL   Monocytes Relative 6 3 - 12 %   Monocytes Absolute 0.6 0.1 - 1.0 K/uL   Eosinophils Relative 1 0 - 5 %   Eosinophils Absolute 0.1 0.0 - 0.7 K/uL   Basophils Relative 0 0 - 1 %   Basophils Absolute 0.0 0.0 - 0.1 K/uL  Comprehensive metabolic panel  Status: Abnormal   Collection Time: 01/07/15  8:48 PM  Result Value Ref Range   Sodium 138 135 - 145 mmol/L   Potassium 3.8 3.5 - 5.1 mmol/L   Chloride 102 101 - 111 mmol/L   CO2 26 22 - 32 mmol/L   Glucose, Bld 193 (H) 65 - 99 mg/dL   BUN 35 (H) 6 - 20 mg/dL   Creatinine, Ser 1.70 (H) 0.44 - 1.00 mg/dL   Calcium 8.7 (L) 8.9 - 10.3 mg/dL   Total Protein 7.4 6.5 - 8.1 g/dL   Albumin 3.3 (L) 3.5 - 5.0 g/dL   AST 14 (L) 15 - 41 U/L   ALT 12 (L) 14 - 54 U/L   Alkaline Phosphatase 85 38 - 126 U/L   Total Bilirubin 0.8 0.3 - 1.2 mg/dL   GFR calc non Af Amer 29 (L) >60 mL/min   GFR calc Af Amer 34 (L) >60 mL/min    Comment: (NOTE) The eGFR has been calculated using the CKD EPI equation. This calculation has not been validated in all clinical situations. eGFR's persistently <60 mL/min signify possible Chronic Kidney Disease.    Anion gap 10 5 - 15  Blood culture (routine x 2)     Status: None (Preliminary result)   Collection Time: 01/07/15  8:48 PM  Result Value Ref Range   Specimen Description A-LINE    Special Requests      BOTTLES DRAWN AEROBIC AND ANAEROBIC 6CC DRAWN BY RN   Culture PENDING    Report Status PENDING   Blood culture (routine x 2)     Status: None (Preliminary result)   Collection Time: 01/07/15  8:48 PM  Result Value Ref Range   Specimen Description A-LINE    Special Requests      BOTTLES DRAWN AEROBIC AND ANAEROBIC 6CC DRAWN BY RN   Culture PENDING    Report  Status PENDING   Lactic acid, plasma     Status: None   Collection Time: 01/07/15  8:48 PM  Result Value Ref Range   Lactic Acid, Venous 0.9 0.5 - 2.0 mmol/L   Dg Chest 2 View  01/07/2015   CLINICAL DATA:  Cough and fever  EXAM: CHEST - 2 VIEW  COMPARISON:  12/06/2014  FINDINGS: Cardiac shadow is stable. The previously seen pacing device is been removed in the interval. A right-sided PICC line is noted in the mid superior vena cava. Left lung is clear. Diffuse infiltrate is noted within the right lung projecting in the lower lobe. This is new from the prior exam. No acute bony abnormality is seen.  IMPRESSION: Right lower lobe pneumonia.   Electronically Signed   By: Inez Catalina M.D.   On: 01/07/2015 21:41    Review of Systems  Constitutional: Positive for fever. Negative for chills, weight loss, malaise/fatigue and diaphoresis.  HENT: Negative.   Eyes: Negative.   Respiratory: Positive for cough and shortness of breath. Negative for hemoptysis, sputum production and wheezing.   Cardiovascular: Negative.   Gastrointestinal: Negative.   Musculoskeletal: Negative.   Skin: Negative.   Neurological: Negative.  Negative for weakness.  Endo/Heme/Allergies: Negative.   Psychiatric/Behavioral: Negative.     Blood pressure 129/72, pulse 72, temperature 100 F (37.8 C), temperature source Oral, resp. rate 20, height '5\' 5"'  (1.651 m), weight 67.586 kg (149 lb), SpO2 100 %. Physical Exam  Constitutional: She is oriented to person, place, and time. She appears well-developed and well-nourished.  HENT:  Head: Normocephalic and atraumatic.  Mouth/Throat: No oropharyngeal exudate.  Eyes: Conjunctivae and EOM are normal. Pupils are equal, round, and reactive to light. No scleral icterus.  Neck: Normal range of motion. Neck supple. No JVD present. No tracheal deviation present. No thyromegaly present.  Cardiovascular: Normal rate and regular rhythm.  Exam reveals no gallop and no friction rub.   No  murmur heard. Respiratory: Effort normal and breath sounds normal. No respiratory distress. She has no wheezes. She has no rales.  GI: Soft. Bowel sounds are normal. She exhibits no distension. There is no tenderness. There is no rebound and no guarding.  Musculoskeletal: Normal range of motion. She exhibits no edema or tenderness.  Lymphadenopathy:    She has no cervical adenopathy.  Neurological: She is alert and oriented to person, place, and time. She has normal reflexes. She displays normal reflexes. No cranial nerve deficit. She exhibits normal muscle tone. Coordination normal.  Skin: Skin is warm and dry. No rash noted. No erythema. No pallor.  Psychiatric: She has a normal mood and affect. Her behavior is normal. Judgment and thought content normal.     Assessment/Plan Dyspnea secondary to hcap Sputum culture Blood culture x2 vanco iv, zosyn iv, levaquin iv pharmacy to dose  Dm2 fsbs ac and qhs, iss  Anemia Check cbc in am,  Pt was scheduled to have what sounds like aranesp injection by oncology , please inquire more about this in am  CKD stage 3 Check cmp in am  CM/Pafib (CHADS2vasc=4) Stable  DVT prophylaxis:  Cy Blamer 01/07/2015, 11:17 PM

## 2015-01-07 NOTE — ED Notes (Signed)
Stood patient at bedside O2 sat 94 percent.

## 2015-01-07 NOTE — Progress Notes (Signed)
ANTIBIOTIC CONSULT NOTE-Preliminary  Pharmacy Consult for Vancomycin Indication: pneumonia  No Known Allergies  Patient Measurements: Height: 5\' 5"  (165.1 cm) Weight: 149 lb (67.586 kg) IBW/kg (Calculated) : 57  Vital Signs: Temp: 100 F (37.8 C) (07/17 1950) Temp Source: Oral (07/17 1950) BP: 141/75 mmHg (07/17 1950) Pulse Rate: 79 (07/17 1950)  Labs:  Recent Labs  01/07/15 2048  WBC 10.1  HGB 8.8*  PLT 200  CREATININE 1.70*    Estimated Creatinine Clearance: 27.3 mL/min (by C-G formula based on Cr of 1.7).  No results for input(s): VANCOTROUGH, VANCOPEAK, VANCORANDOM, GENTTROUGH, GENTPEAK, GENTRANDOM, TOBRATROUGH, TOBRAPEAK, TOBRARND, AMIKACINPEAK, AMIKACINTROU, AMIKACIN in the last 72 hours.   Microbiology: Recent Results (from the past 720 hour(s))  Clostridium Difficile by PCR (not at [facility])     Status: None   Collection Time: 12/10/14  9:27 AM  Result Value Ref Range Status   C difficile by pcr NEGATIVE NEGATIVE Final  Culture, Urine     Status: None   Collection Time: 12/18/14 10:30 AM  Result Value Ref Range Status   Specimen Description URINE, CLEAN CATCH  Final   Special Requests NONE  Final   Culture   Final    NO GROWTH 2 DAYS Performed at [facility]    Report Status 12/20/2014 FINAL  Final  Blood culture (routine x 2)     Status: None (Preliminary result)   Collection Time: 01/07/15  8:48 PM  Result Value Ref Range Status   Specimen Description A-LINE  Final   Special Requests   Final    BOTTLES DRAWN AEROBIC AND ANAEROBIC 6CC DRAWN BY RN   Culture PENDING  Incomplete   Report Status PENDING  Incomplete  Blood culture (routine x 2)     Status: None (Preliminary result)   Collection Time: 01/07/15  8:48 PM  Result Value Ref Range Status   Specimen Description A-LINE  Final   Special Requests   Final    BOTTLES DRAWN AEROBIC AND ANAEROBIC 6CC DRAWN BY RN   Culture PENDING  Incomplete   Report Status PENDING  Incomplete    Medical  History: Past Medical History  Diagnosis Date  . Cardiomyopathy, nonischemic     a. 1999 nl cath;  b. 12/05 Guidant [place];  c. 10/2005 ICD extraction 2/2 enterococcus bacteremia and Veg on RV lead;  c. 05/2008 low risk Myoview (scarring w/ some evidence of inf ischemia);  d. 11/2012 Echo: EF 15-20%;  e. 01/2013 s/p MDT [facility] CRT D, ser # [id number];  f. 05/2013 Echo: EF 15%.  . Enterococcal infection     a. 10/2005 - AICD-explanted  . Pulmonary embolism     a. 07/2005 after total right hip arthroplasty  . Hilar density     a. infrahilar mass/adenopathy on CT scan 5/07; subsequently  resolved  . LBBB (left bundle branch block)   . GERD (gastroesophageal reflux disease)   . Hyperlipidemia   . Hypertension   . Tobacco abuse     a. discontinued in 1997, and then resumed  . Urinary incontinence   . Anemia     a. mild/chronic  . Villous adenoma of colon     a. tubovillous adenomatous polyp with focal high grade dysplasia; presented with hematochezia - followed by Dr. Laural Golden.  . Implantable cardioverter-defibrillator-CRT- Mdt     a.  01/2013 s/p MDT [facility] CRT D, ser # [id number]  . Chronic systolic CHF (congestive heart failure)     a. 11/2012 Echo: EF  15-20%;  b. 05/2013 TEE EF 15%.  . Pneumonia 07/2005  . Obstructive sleep apnea     a. mild-did not tolerate CPAP (01/24/2013)  . History of blood transfusion   . Degenerative joint disease     of knees, shoulder, and hips  . PAF (paroxysmal atrial fibrillation)     a. 07/2012 s/p TEE/DCCV;  b. chronic coumadin;  c. 05/2013 Recurrent Afib->TEE/DCCV and amio initiation.  . CKD (chronic kidney disease), stage III     creatinin-1.44 in 1/09; 1.51 in 1/10  . Type II diabetes mellitus     type 2    Anti-infectives    Start     Dose/Rate Route Frequency Ordered Stop   01/07/15 2315  vancomycin (VANCOCIN) IVPB 1000 mg/200 mL premix     1,000 mg 200 mL/hr over 60 Minutes Intravenous  Once 01/07/15 2309     01/07/15 2145  cefTRIAXone (ROCEPHIN)  1 g in dextrose 5 % 50 mL IVPB     1 g 100 mL/hr over 30 Minutes Intravenous  Once 01/07/15 2134 01/07/15 2218   01/07/15 2145  azithromycin (ZITHROMAX) 500 mg in dextrose 5 % 250 mL IVPB  Status:  Discontinued     500 mg 250 mL/hr over 60 Minutes Intravenous  Once 01/07/15 2134 01/07/15 2251     Assessment: 69yo female c/o fever, chill, and SOB.  Asked to initiate Vancomycin for suspected pna.  Estimated Creatinine Clearance: 27.3 mL/min (by C-G formula based on Cr of 1.7).  Goal of Therapy:  Vancomycin trough level 15-20 mcg/ml  Plan:  Preliminary review of pertinent patient information completed.  Protocol will be initiated with a one-time dose(s) of Vancomycin 1000mg .  Forestine Na clinical pharmacist will complete review during morning rounds to assess patient and finalize treatment regimen.  Ena Dawley, [place] 01/07/2015,11:12 PM

## 2015-01-07 NOTE — ED Provider Notes (Signed)
CSN: [id number]     Arrival date & time 01/07/15  1942 History  This chart was scribed for Nat Christen, MD by Irene Pap, ED Scribe. This patient was seen in room APA04/APA04 and patient care was started at 8:03 PM.   Chief Complaint  Patient presents with  . Shortness of Breath   The history is provided by the patient and the spouse. No language interpreter was used.   HPI Comments: NEOSHA SWITALSKI is a 69 y.o. female with hx of CHF, PAF, and GERD who presents to the Emergency Department complaining of cough, wheezing, chills, fever, SOB, and abdominal pain onset earlier today. Husband states that her BP was also up today, but was able to eat and walk around normally; states that she does not look as well as she usually does. Husband reports using two different drip anti-biotics via IV, one 3x a day and one 2x a day; had PICC line placed one week ago. States that the pt has been hospitalized 3-4 times in the past 3 months and Dr. Teressa Senter placed PICC line. States Dr. Linus Salmons is her infectious disease doctor. Denies dysuria, difficulty urinating, or productive cough.   Past Medical History  Diagnosis Date  . Cardiomyopathy, nonischemic     a. 1999 nl cath;  b. 12/05 [facility] [place];  c. 10/2005 ICD extraction 2/2 enterococcus bacteremia and Veg on RV lead;  c. 05/2008 low risk Myoview (scarring w/ some evidence of inf ischemia);  d. 11/2012 Echo: EF 15-20%;  e. 01/2013 s/p MDT [place] CRT D, ser # [id number];  f. 05/2013 Echo: EF 15%.  . Enterococcal infection     a. 10/2005 - AICD-explanted  . Pulmonary embolism     a. 07/2005 after total right hip arthroplasty  . Hilar density     a. infrahilar mass/adenopathy on CT scan 5/07; subsequently  resolved  . LBBB (left bundle branch block)   . GERD (gastroesophageal reflux disease)   . Hyperlipidemia   . Hypertension   . Tobacco abuse     a. discontinued in 1997, and then resumed  . Urinary incontinence   . Anemia     a. mild/chronic  . Villous  adenoma of colon     a. tubovillous adenomatous polyp with focal high grade dysplasia; presented with hematochezia - followed by Dr. Laural Golden.  . Implantable cardioverter-defibrillator-CRT- Mdt     a.  01/2013 s/p MDT [place] CRT D, ser # [id number]  . Chronic systolic CHF (congestive heart failure)     a. 11/2012 Echo: EF 15-20%;  b. 05/2013 TEE EF 15%.  . Pneumonia 07/2005  . Obstructive sleep apnea     a. mild-did not tolerate CPAP (01/24/2013)  . History of blood transfusion   . Degenerative joint disease     of knees, shoulder, and hips  . PAF (paroxysmal atrial fibrillation)     a. 07/2012 s/p TEE/DCCV;  b. chronic coumadin;  c. 05/2013 Recurrent Afib->TEE/DCCV and amio initiation.  . CKD (chronic kidney disease), stage III     creatinin-1.44 in 1/09; 1.51 in 1/10  . Type II diabetes mellitus     type 2   Past Surgical History  Procedure Laterality Date  . Pacemaker removal  11/18/05    Enterococcal infection  . Total hip arthroplasty Right 07/2005  . Knee arthroscopy Right 1980's?  . Colonoscopy w/ polypectomy  2009  . Cardioversion N/A 08/20/2012    Procedure: TEE GUIDED CARDIOVERSION;  Surgeon: Yehuda Savannah, MD;  Location: [facility];  Service: Cardiovascular;  Laterality: N/A;  To be done @ bedside  . Tee without cardioversion N/A 08/20/2012    Procedure: TRANSESOPHAGEAL ECHOCARDIOGRAM (TEE);  Surgeon: Yehuda Savannah, MD;  Location: [facility];  Service: Cardiovascular;  Laterality: N/A;  . Bi-ventricular implantable cardioverter defibrillator  (crt-d)  01/24/2013  . A-v cardiac pacemaker insertion  12/05    Biventricular pacemaker/AICD  . Abdominal hysterectomy  1990/92    Initial partial hysterectomy followed by BSO  . Tubal ligation  1980's  . Cardiac catheterization    . Colonoscopy with esophagogastroduodenoscopy (egd) N/A 06/10/2013    Procedure: COLONOSCOPY WITH ESOPHAGOGASTRODUODENOSCOPY (EGD);  Surgeon: Rogene Houston, MD;  Location: [facility];  Service: Endoscopy;   Laterality: N/A;  925  . Tee without cardioversion N/A 06/20/2013    Procedure: TRANSESOPHAGEAL ECHOCARDIOGRAM (TEE);  Surgeon: Dorothy Spark, MD;  Location: [place];  Service: Cardiovascular;  Laterality: N/A;  . Cardioversion N/A 06/20/2013    Procedure: CARDIOVERSION;  Surgeon: Dorothy Spark, MD;  Location: [facility] ENDOSCOPY;  Service: Cardiovascular;  Laterality: N/A;  . Bi-ventricular implantable cardioverter defibrillator N/A 01/24/2013    Procedure: BI-VENTRICULAR IMPLANTABLE CARDIOVERTER DEFIBRILLATOR  (CRT-D);  Surgeon: Evans Lance, MD;  Location: [facility] CATH LAB;  Service: Cardiovascular;  Laterality: N/A;  . Right heart catheterization N/A 04/18/2014    Procedure: RIGHT HEART CATH;  Surgeon: Larey Dresser, MD;  Location: [facility] CATH LAB;  Service: Cardiovascular;  Laterality: N/A;  . Esophagogastroduodenoscopy N/A 10/21/2014    Procedure: ESOPHAGOGASTRODUODENOSCOPY (EGD);  Surgeon: Inda Castle, MD;  Location: [place];  Service: Endoscopy;  Laterality: N/A;  . Colonoscopy Left 10/24/2014    Procedure: COLONOSCOPY;  Surgeon: Carol Ada, MD;  Location: [facility] ENDOSCOPY;  Service: Endoscopy;  Laterality: Left;  Freda Munro capsule study N/A 10/24/2014    Procedure: GIVENS CAPSULE STUDY;  Surgeon: Carol Ada, MD;  Location: [facility] ENDOSCOPY;  Service: Endoscopy;  Laterality: N/A;  . Tee without cardioversion N/A 12/08/2014    Procedure: TRANSESOPHAGEAL ECHOCARDIOGRAM (TEE);  Surgeon: Fay Records, MD;  [facility];  Service: Cardiovascular;  Laterality: N/A;  . Icd lead removal N/A 12/13/2014    Procedure: ICD LEAD REMOVAL/EXTRACTION ;  Surgeon: Evans Lance, MD;  Location: [facility] OR;  Service: Cardiovascular;  Laterality: N/A;  [place] back up   Family History  Problem Relation Age of Onset  . Hypertension Mother   . Diabetes Mother   . Coronary artery disease Father   . Diabetes Brother   . Hypertension Brother   . Lung cancer Brother   . Arthritis Other   . Diabetes Other    . Heart disease Other     female < 55   History  Substance Use Topics  . Smoking status: Former Smoker -- 12 years    Types: Cigarettes  . Smokeless tobacco: Never Used     Comment: 05/2013: Smokes an occasional cigarette.  Says that she doesn't inhale.  . Alcohol Use: No   OB History    No data available     Review of Systems  A complete 10 system review of systems was obtained and all systems are negative except as noted in the HPI and PMH.   Allergies  Review of patient's allergies indicates no known allergies.  Home Medications   Prior to Admission medications   Medication Sig Start Date End Date Taking? Authorizing Provider  amiodarone (PACERONE) 200 MG tablet Take 1 tablet (200 mg total) by mouth  daily. 02/08/14  Yes Imogene Burn, PA-C  Ampicillin-Sulbactam 3 g in sodium chloride 0.9 % 100 mL Inject 3 g into the vein every 8 (eight) hours. 12/15/14  Yes Verlee Monte, MD  carvedilol (COREG) 3.125 MG tablet Take 3 tablets (9.375 mg total) by mouth 2 (two) times daily with a meal. 10/31/14  Yes Larey Dresser, MD  cefTRIAXone (ROCEPHIN) 40 MG/ML IVPB Inject 50 mLs (2 g total) into the vein every 12 (twelve) hours. 12/15/14  Yes Verlee Monte, MD  digoxin (LANOXIN) 0.125 MG tablet Take 0.5 tablets (0.0625 mg total) by mouth every other day. 10/26/14  Yes Rushil Sherrye Payor, MD  feeding supplement, ENSURE ENLIVE, (ENSURE ENLIVE) LIQD Take 237 mLs by mouth 2 (two) times daily between meals. Patient taking differently: Take 237 mLs by mouth daily.  10/26/14  Yes Rushil Sherrye Payor, MD  ferrous sulfate 324 (65 FE) MG TBEC Take 1 tablet (325 mg total) by mouth 2 (two) times daily. 12/02/14  Yes Orson Eva, MD  furosemide (LASIX) 40 MG tablet Take 1 tablet (40 mg total) by mouth 2 (two) times daily. 01/05/15  Yes Larey Dresser, MD  glimepiride (AMARYL) 2 MG tablet Take 2 mg by mouth daily. 12/05/14  Yes Historical Provider, MD  isosorbide mononitrate (IMDUR) 30 MG 24 hr tablet Take 1 tablet (30 mg  total) by mouth daily. 10/26/14  Yes Rushil Sherrye Payor, MD  levothyroxine (SYNTHROID, LEVOTHROID) 25 MCG tablet Take 1 tablet (25 mcg total) by mouth daily before breakfast. 10/26/14  Yes Rushil Sherrye Payor, MD  lisinopril (PRINIVIL,ZESTRIL) 5 MG tablet Take 5 mg by mouth daily. 12/01/14  Yes Historical Provider, MD  metoCLOPramide (REGLAN) 5 MG tablet Take 5 mg by mouth 2 (two) times daily.    Yes Historical Provider, MD  ondansetron (ZOFRAN) 4 MG tablet Take 4 mg by mouth every 4 (four) hours as needed for nausea or vomiting.   Yes Historical Provider, MD  oxyCODONE-acetaminophen (PERCOCET) 10-325 MG per tablet Take 1 tablet by mouth every 6 (six) hours as needed for pain.  12/09/13  Yes Historical Provider, MD  polyethylene glycol (MIRALAX / GLYCOLAX) packet Take 17 g by mouth daily as needed for mild constipation.   Yes Historical Provider, MD  pravastatin (PRAVACHOL) 40 MG tablet Take 80 mg by mouth at bedtime.    Yes Historical Provider, MD  sodium chloride 0.9 % injection Inject 10 mLs into the vein every 12 (twelve) hours.   Yes Historical Provider, MD  spironolactone (ALDACTONE) 25 MG tablet Take 0.5 tablets (12.5 mg total) by mouth daily. 01/02/15  Yes Larey Dresser, MD  warfarin (COUMADIN) 2.5 MG tablet Take 0.5 tablets (1.25 mg total) by mouth daily at 6 PM. Patient taking differently: Take 1.25-2.5 mg by mouth daily at 6 PM. Takes 1 tablet on Mon, Wed, Fri, take 0.5 tablet on all other days 10/26/14  Yes Rushil Sherrye Payor, MD  colchicine 0.6 MG tablet Take 0.6 mg by mouth 2 (two) times daily as needed.  12/20/13   Historical Provider, MD  polyethylene glycol powder (GLYCOLAX/MIRALAX) powder Take 1 Container by mouth daily as needed for mild constipation.  11/14/14   Historical Provider, MD   BP 141/75 mmHg  Pulse 79  Temp(Src) 100 F (37.8 C) (Oral)  Resp 20  Ht 5\' 5"  (1.651 m)  Wt 149 lb (67.586 kg)  BMI 24.79 kg/m2  SpO2 94%  Physical Exam  Constitutional: She is oriented to person, place, and  time. She  appears well-developed and well-nourished.  HENT:  Head: Normocephalic and atraumatic.  Eyes: Conjunctivae and EOM are normal. Pupils are equal, round, and reactive to light.  Neck: Normal range of motion. Neck supple.  Cardiovascular: Normal rate and regular rhythm.   Pulmonary/Chest: Effort normal and breath sounds normal.  Abdominal: Soft. Bowel sounds are normal.  Musculoskeletal: Normal range of motion.  PICC line in right antecubital   Neurological: She is alert and oriented to person, place, and time.  Skin: Skin is warm and dry.  Psychiatric: She has a normal mood and affect. Her behavior is normal.  Nursing note and vitals reviewed.   ED Course  Procedures (including critical care time) DIAGNOSTIC STUDIES: Oxygen Saturation is 94% on RA, adequate by my interpretation.    COORDINATION OF CARE: 8:13 PM-Discussed treatment plan which includes urine, blood cultures, chest x-ray with pt at bedside and pt agreed to plan.   Labs Review Results for orders placed or performed during the hospital encounter of 01/07/15  Blood culture (routine x 2)  Result Value Ref Range   Specimen Description A-LINE    Special Requests      BOTTLES DRAWN AEROBIC AND ANAEROBIC 6CC DRAWN BY RN   Culture PENDING    Report Status PENDING   Blood culture (routine x 2)  Result Value Ref Range   Specimen Description A-LINE    Special Requests      BOTTLES DRAWN AEROBIC AND ANAEROBIC 6CC DRAWN BY RN   Culture PENDING    Report Status PENDING   CBC with Differential  Result Value Ref Range   WBC 10.1 4.0 - 10.5 K/uL   RBC 2.75 (L) 3.87 - 5.11 MIL/uL   Hemoglobin 8.8 (L) 12.0 - 15.0 g/dL   HCT 26.2 (L) 36.0 - 46.0 %   MCV 95.3 78.0 - 100.0 fL   MCH 32.0 26.0 - 34.0 pg   MCHC 33.6 30.0 - 36.0 g/dL   RDW 16.9 (H) 11.5 - 15.5 %   Platelets 200 150 - 400 K/uL   Neutrophils Relative % 88 (H) 43 - 77 %   Neutro Abs 9.0 (H) 1.7 - 7.7 K/uL   Lymphocytes Relative 5 (L) 12 - 46 %   Lymphs  Abs 0.5 (L) 0.7 - 4.0 K/uL   Monocytes Relative 6 3 - 12 %   Monocytes Absolute 0.6 0.1 - 1.0 K/uL   Eosinophils Relative 1 0 - 5 %   Eosinophils Absolute 0.1 0.0 - 0.7 K/uL   Basophils Relative 0 0 - 1 %   Basophils Absolute 0.0 0.0 - 0.1 K/uL  Comprehensive metabolic panel  Result Value Ref Range   Sodium 138 135 - 145 mmol/L   Potassium 3.8 3.5 - 5.1 mmol/L   Chloride 102 101 - 111 mmol/L   CO2 26 22 - 32 mmol/L   Glucose, Bld 193 (H) 65 - 99 mg/dL   BUN 35 (H) 6 - 20 mg/dL   Creatinine, Ser 1.70 (H) 0.44 - 1.00 mg/dL   Calcium 8.7 (L) 8.9 - 10.3 mg/dL   Total Protein 7.4 6.5 - 8.1 g/dL   Albumin 3.3 (L) 3.5 - 5.0 g/dL   AST 14 (L) 15 - 41 U/L   ALT 12 (L) 14 - 54 U/L   Alkaline Phosphatase 85 38 - 126 U/L   Total Bilirubin 0.8 0.3 - 1.2 mg/dL   GFR calc non Af Amer 29 (L) >60 mL/min   GFR calc Af Amer 34 (L) >60 mL/min  Anion gap 10 5 - 15  Lactic acid, plasma  Result Value Ref Range   Lactic Acid, Venous 0.9 0.5 - 2.0 mmol/L    Imaging Review Dg Chest 2 View  01/07/2015   CLINICAL DATA:  Cough and fever  EXAM: CHEST - 2 VIEW  COMPARISON:  12/06/2014  FINDINGS: Cardiac shadow is stable. The previously seen pacing device is been removed in the interval. A right-sided PICC line is noted in the mid superior vena cava. Left lung is clear. Diffuse infiltrate is noted within the right lung projecting in the lower lobe. This is new from the prior exam. No acute bony abnormality is seen.  IMPRESSION: Right lower lobe pneumonia.   Electronically Signed   By: Inez Catalina M.D.   On: 01/07/2015 21:41    EKG Interpretation None     CRITICAL CARE Performed by: Nat Christen  ?  Total critical care time: 30  Critical care time was exclusive of separately billable procedures and treating other patients.  Critical care was necessary to treat or prevent imminent or life-threatening deterioration.  Critical care was time spent personally by me on the following activities:  development of treatment plan with patient and/or surrogate as well as nursing, discussions with consultants, evaluation of patient's response to treatment, examination of patient, obtaining history from patient or surrogate, ordering and performing treatments and interventions, ordering and review of laboratory studies, ordering and review of radiographic studies, pulse oximetry and re-evaluation of patient's condition.  MDM   Final diagnoses:  Healthcare-associated pneumonia  Enterococcal bacteremia    Patient on home intravenous antibiotics for enterococcus bacteremia. Now she has developed a right lower lobe pneumonia. She desaturates with any activity and even in bed. Will start antibiotic for healthcare associated pneumonia and admit to telemetry.     Nat Christen, MD 01/07/15 [id number]

## 2015-01-07 NOTE — ED Notes (Signed)
Pt c/o having chills and sob that started today

## 2015-01-07 NOTE — ED Notes (Signed)
Patient's personal meds of ampicillin 3gm were used per dr cook's orders.

## 2015-01-08 ENCOUNTER — Encounter (HOSPITAL_COMMUNITY): Payer: Self-pay | Admitting: *Deleted

## 2015-01-08 ENCOUNTER — Other Ambulatory Visit (HOSPITAL_COMMUNITY): Payer: Commercial Managed Care - HMO

## 2015-01-08 ENCOUNTER — Encounter (HOSPITAL_COMMUNITY): Payer: Commercial Managed Care - HMO

## 2015-01-08 DIAGNOSIS — E118 Type 2 diabetes mellitus with unspecified complications: Secondary | ICD-10-CM | POA: Diagnosis present

## 2015-01-08 DIAGNOSIS — J9601 Acute respiratory failure with hypoxia: Secondary | ICD-10-CM | POA: Diagnosis present

## 2015-01-08 DIAGNOSIS — I480 Paroxysmal atrial fibrillation: Secondary | ICD-10-CM | POA: Diagnosis present

## 2015-01-08 LAB — COMPREHENSIVE METABOLIC PANEL
ALT: 10 U/L — ABNORMAL LOW (ref 14–54)
ANION GAP: 10 (ref 5–15)
AST: 10 U/L — AB (ref 15–41)
Albumin: 2.9 g/dL — ABNORMAL LOW (ref 3.5–5.0)
Alkaline Phosphatase: 71 U/L (ref 38–126)
BILIRUBIN TOTAL: 0.8 mg/dL (ref 0.3–1.2)
BUN: 32 mg/dL — ABNORMAL HIGH (ref 6–20)
CALCIUM: 8.5 mg/dL — AB (ref 8.9–10.3)
CO2: 27 mmol/L (ref 22–32)
CREATININE: 1.63 mg/dL — AB (ref 0.44–1.00)
Chloride: 103 mmol/L (ref 101–111)
GFR calc Af Amer: 36 mL/min — ABNORMAL LOW (ref >60)
GFR calc non Af Amer: 31 mL/min — ABNORMAL LOW (ref >60)
Glucose, Bld: 78 mg/dL (ref 65–99)
POTASSIUM: 3.8 mmol/L (ref 3.5–5.1)
Sodium: 140 mmol/L (ref 135–145)
Total Protein: 6.7 g/dL (ref 6.5–8.1)

## 2015-01-08 LAB — URINALYSIS, ROUTINE W REFLEX MICROSCOPIC
Bilirubin Urine: NEGATIVE
Glucose, UA: NEGATIVE mg/dL
Ketones, ur: NEGATIVE mg/dL
Leukocytes, UA: NEGATIVE
NITRITE: NEGATIVE
Specific Gravity, Urine: 1.02 (ref 1.005–1.030)
Urobilinogen, UA: 0.2 mg/dL (ref 0.0–1.0)
pH: 5.5 (ref 5.0–8.0)

## 2015-01-08 LAB — PROTIME-INR
INR: 1.5 — ABNORMAL HIGH (ref 0.00–1.49)
PROTHROMBIN TIME: 18.2 s — AB (ref 11.6–15.2)

## 2015-01-08 LAB — GLUCOSE, CAPILLARY
GLUCOSE-CAPILLARY: 152 mg/dL — AB (ref 65–99)
GLUCOSE-CAPILLARY: 107 mg/dL — AB (ref 65–99)
GLUCOSE-CAPILLARY: 84 mg/dL (ref 65–99)
Glucose-Capillary: 202 mg/dL — ABNORMAL HIGH (ref 65–99)
Glucose-Capillary: 73 mg/dL (ref 65–99)
Glucose-Capillary: 105 mg/dL — ABNORMAL HIGH (ref 65–99)

## 2015-01-08 LAB — URINE MICROSCOPIC-ADD ON

## 2015-01-08 LAB — CBC
HEMATOCRIT: 23.8 % — AB (ref 36.0–46.0)
Hemoglobin: 7.8 g/dL — ABNORMAL LOW (ref 12.0–15.0)
MCH: 31.5 pg (ref 26.0–34.0)
MCHC: 32.8 g/dL (ref 30.0–36.0)
MCV: 96 fL (ref 78.0–100.0)
PLATELETS: 170 10*3/uL (ref 150–400)
RBC: 2.48 MIL/uL — ABNORMAL LOW (ref 3.87–5.11)
RDW: 17 % — ABNORMAL HIGH (ref 11.5–15.5)
WBC: 6.6 10*3/uL (ref 4.0–10.5)

## 2015-01-08 LAB — STREP PNEUMONIAE URINARY ANTIGEN: Strep Pneumo Urinary Antigen: NEGATIVE

## 2015-01-08 LAB — MRSA PCR SCREENING: MRSA by PCR: NEGATIVE

## 2015-01-08 MED ORDER — FUROSEMIDE 40 MG PO TABS
40.0000 mg | ORAL_TABLET | Freq: Two times a day (BID) | ORAL | Status: DC
  Administered 2015-01-08 – 2015-01-10 (×5): 40 mg via ORAL
  Filled 2015-01-08 (×5): qty 1

## 2015-01-08 MED ORDER — WARFARIN SODIUM 5 MG PO TABS
2.5000 mg | ORAL_TABLET | Freq: Once | ORAL | Status: AC
  Administered 2015-01-08: 2.5 mg via ORAL
  Filled 2015-01-08: qty 1

## 2015-01-08 MED ORDER — CARVEDILOL 3.125 MG PO TABS
9.3750 mg | ORAL_TABLET | Freq: Two times a day (BID) | ORAL | Status: DC
  Administered 2015-01-08 – 2015-01-10 (×4): 9.375 mg via ORAL
  Filled 2015-01-08 (×5): qty 3

## 2015-01-08 MED ORDER — VANCOMYCIN HCL IN DEXTROSE 750-5 MG/150ML-% IV SOLN
750.0000 mg | INTRAVENOUS | Status: DC
  Administered 2015-01-08 – 2015-01-09 (×2): 750 mg via INTRAVENOUS
  Filled 2015-01-08 (×3): qty 150

## 2015-01-08 MED ORDER — LISINOPRIL 5 MG PO TABS
5.0000 mg | ORAL_TABLET | Freq: Every day | ORAL | Status: DC
  Administered 2015-01-08 – 2015-01-10 (×3): 5 mg via ORAL
  Filled 2015-01-08 (×3): qty 1

## 2015-01-08 MED ORDER — LEVOFLOXACIN IN D5W 750 MG/150ML IV SOLN
750.0000 mg | INTRAVENOUS | Status: DC
  Administered 2015-01-08: 750 mg via INTRAVENOUS
  Filled 2015-01-08: qty 150

## 2015-01-08 MED ORDER — PRAVASTATIN SODIUM 40 MG PO TABS
80.0000 mg | ORAL_TABLET | Freq: Every day | ORAL | Status: DC
  Administered 2015-01-08 – 2015-01-09 (×3): 80 mg via ORAL
  Filled 2015-01-08 (×3): qty 2

## 2015-01-08 MED ORDER — LEVOTHYROXINE SODIUM 25 MCG PO TABS
25.0000 ug | ORAL_TABLET | Freq: Every day | ORAL | Status: DC
  Administered 2015-01-08 – 2015-01-10 (×3): 25 ug via ORAL
  Filled 2015-01-08 (×3): qty 1

## 2015-01-08 MED ORDER — GUAIFENESIN ER 600 MG PO TB12
1200.0000 mg | ORAL_TABLET | Freq: Two times a day (BID) | ORAL | Status: DC
  Administered 2015-01-08 – 2015-01-10 (×5): 1200 mg via ORAL
  Filled 2015-01-08 (×5): qty 2

## 2015-01-08 MED ORDER — ISOSORBIDE MONONITRATE ER 60 MG PO TB24
30.0000 mg | ORAL_TABLET | Freq: Every day | ORAL | Status: DC
  Administered 2015-01-08 – 2015-01-10 (×3): 30 mg via ORAL
  Filled 2015-01-08 (×3): qty 1

## 2015-01-08 MED ORDER — INSULIN ASPART 100 UNIT/ML ~~LOC~~ SOLN
0.0000 [IU] | Freq: Three times a day (TID) | SUBCUTANEOUS | Status: DC
  Administered 2015-01-09 – 2015-01-10 (×2): 1 [IU] via SUBCUTANEOUS

## 2015-01-08 MED ORDER — PIPERACILLIN-TAZOBACTAM 3.375 G IVPB
3.3750 g | Freq: Three times a day (TID) | INTRAVENOUS | Status: DC
  Administered 2015-01-08 – 2015-01-10 (×7): 3.375 g via INTRAVENOUS
  Filled 2015-01-08 (×10): qty 50

## 2015-01-08 MED ORDER — GLIMEPIRIDE 2 MG PO TABS
2.0000 mg | ORAL_TABLET | Freq: Every day | ORAL | Status: DC
  Administered 2015-01-08: 2 mg via ORAL
  Filled 2015-01-08: qty 1

## 2015-01-08 MED ORDER — WARFARIN - PHARMACIST DOSING INPATIENT
Status: DC
Start: 2015-01-08 — End: 2015-01-10

## 2015-01-08 MED ORDER — METOCLOPRAMIDE HCL 10 MG PO TABS
5.0000 mg | ORAL_TABLET | Freq: Two times a day (BID) | ORAL | Status: DC
  Administered 2015-01-08 – 2015-01-10 (×6): 5 mg via ORAL
  Filled 2015-01-08 (×6): qty 1

## 2015-01-08 MED ORDER — IPRATROPIUM-ALBUTEROL 0.5-2.5 (3) MG/3ML IN SOLN
3.0000 mL | Freq: Three times a day (TID) | RESPIRATORY_TRACT | Status: DC
  Administered 2015-01-09 – 2015-01-10 (×4): 3 mL via RESPIRATORY_TRACT
  Filled 2015-01-08 (×4): qty 3

## 2015-01-08 MED ORDER — IPRATROPIUM-ALBUTEROL 0.5-2.5 (3) MG/3ML IN SOLN
3.0000 mL | Freq: Four times a day (QID) | RESPIRATORY_TRACT | Status: DC
  Administered 2015-01-08 (×2): 3 mL via RESPIRATORY_TRACT
  Filled 2015-01-08 (×2): qty 3

## 2015-01-08 MED ORDER — OXYCODONE-ACETAMINOPHEN 5-325 MG PO TABS
2.0000 | ORAL_TABLET | Freq: Four times a day (QID) | ORAL | Status: DC | PRN
  Administered 2015-01-10: 2 via ORAL
  Filled 2015-01-08: qty 2

## 2015-01-08 MED ORDER — FERROUS SULFATE 325 (65 FE) MG PO TABS
325.0000 mg | ORAL_TABLET | Freq: Two times a day (BID) | ORAL | Status: DC
  Administered 2015-01-08 – 2015-01-10 (×6): 325 mg via ORAL
  Filled 2015-01-08 (×6): qty 1

## 2015-01-08 MED ORDER — ENSURE ENLIVE PO LIQD
237.0000 mL | Freq: Two times a day (BID) | ORAL | Status: DC
  Administered 2015-01-08 – 2015-01-10 (×4): 237 mL via ORAL

## 2015-01-08 MED ORDER — POLYETHYLENE GLYCOL 3350 17 G PO PACK: 17.0000 g | PACK | Freq: Every day | ORAL | Status: DC | PRN

## 2015-01-08 MED ORDER — SPIRONOLACTONE 25 MG PO TABS
12.5000 mg | ORAL_TABLET | Freq: Every day | ORAL | Status: DC
  Administered 2015-01-08 – 2015-01-10 (×3): 12.5 mg via ORAL
  Filled 2015-01-08 (×3): qty 1

## 2015-01-08 MED ORDER — DIGOXIN 125 MCG PO TABS
0.0625 mg | ORAL_TABLET | ORAL | Status: DC
  Administered 2015-01-08 – 2015-01-10 (×2): 0.0625 mg via ORAL
  Filled 2015-01-08 (×2): qty 1

## 2015-01-08 MED ORDER — ONDANSETRON HCL 4 MG PO TABS: 4.0000 mg | ORAL_TABLET | ORAL | Status: DC | PRN

## 2015-01-08 MED ORDER — COLCHICINE 0.6 MG PO TABS: 0.6000 mg | ORAL_TABLET | Freq: Two times a day (BID) | ORAL | Status: DC | PRN

## 2015-01-08 MED ORDER — AMIODARONE HCL 200 MG PO TABS
200.0000 mg | ORAL_TABLET | Freq: Every day | ORAL | Status: DC
  Administered 2015-01-08 – 2015-01-10 (×3): 200 mg via ORAL
  Filled 2015-01-08 (×3): qty 1

## 2015-01-08 MED ORDER — INSULIN ASPART 100 UNIT/ML ~~LOC~~ SOLN
0.0000 [IU] | Freq: Every day | SUBCUTANEOUS | Status: DC
  Administered 2015-01-08: 2 [IU] via SUBCUTANEOUS

## 2015-01-08 NOTE — Care Management Note (Signed)
Case Management Note  Patient Details  Name: [name] MRN: [id number] Date of Birth: 1946-02-14  Subjective/Objective:                  Pt admitted from home with pneumonia. Pt lives with her husband and will return home at discharge. Pt has a cane, walker, and BSC for home use. Pt is active with [facility] RN for IV AB (for  Weeks).  Action/Plan: Will continue to follow for discharge planning needs. Will arrange resumption of [name] RN at discharge.  Expected Discharge Date:  01/11/15               Expected Discharge Plan:  [place]  In-House Referral:  NA  Discharge planning Services  CM Consult  Post Acute Care Choice:  Resumption of Svcs/PTA Provider Choice offered to:  Patient  DME Arranged:    DME Agency:     HH Arranged:  RN, IV Antibiotics HH Agency:  [name]  Status of Service:  In process, will continue to follow  Medicare Important Message Given:    Date Medicare IM Given:    Medicare IM give by:    Date Additional Medicare IM Given:    Additional Medicare Important Message give by:     If discussed at [place] of Stay Meetings, dates discussed:    Additional Comments:  Joylene Draft, RN 01/08/2015, 11:52 AM

## 2015-01-08 NOTE — Progress Notes (Signed)
ANTIBIOTIC CONSULT NOTE- follow up  Pharmacy Consult for Vancomycin, Zosyn, Levaquin Indication: pneumonia  No Known Allergies  Patient Measurements: Height: 5\' 5"  (165.1 cm) Weight: 152 lb 1.9 oz (69 kg) IBW/kg (Calculated) : 57  Vital Signs: Temp: 98.5 F (36.9 C) (07/18 0653) Temp Source: Oral (07/18 0653) BP: 114/62 mmHg (07/18 0653) Pulse Rate: 74 (07/18 0653)  Labs:  Recent Labs  01/07/15 2048 01/08/15 0704  WBC 10.1 6.6  HGB 8.8* 7.8*  PLT 200 170  CREATININE 1.70* 1.63*   Estimated Creatinine Clearance: 30.9 mL/min (by C-G formula based on Cr of 1.63).  No results for input(s): VANCOTROUGH, VANCOPEAK, VANCORANDOM, GENTTROUGH, GENTPEAK, GENTRANDOM, TOBRATROUGH, TOBRAPEAK, TOBRARND, AMIKACINPEAK, AMIKACINTROU, AMIKACIN in the last 72 hours.   Microbiology: Recent Results (from the past 720 hour(s))  Clostridium Difficile by PCR (not at [facility])     Status: None   Collection Time: 12/10/14  9:27 AM  Result Value Ref Range Status   C difficile by pcr NEGATIVE NEGATIVE Final  Culture, Urine     Status: None   Collection Time: 12/18/14 10:30 AM  Result Value Ref Range Status   Specimen Description URINE, CLEAN CATCH  Final   Special Requests NONE  Final   Culture   Final    NO GROWTH 2 DAYS Performed at [facility]    Report Status 12/20/2014 FINAL  Final  Blood culture (routine x 2)     Status: None (Preliminary result)   Collection Time: 01/07/15  8:48 PM  Result Value Ref Range Status   Specimen Description A-LINE  Final   Special Requests   Final    BOTTLES DRAWN AEROBIC AND ANAEROBIC 6CC DRAWN BY RN   Culture NO GROWTH < 12 HOURS  Final   Report Status PENDING  Incomplete  Blood culture (routine x 2)     Status: None (Preliminary result)   Collection Time: 01/07/15  8:48 PM  Result Value Ref Range Status   Specimen Description A-LINE  Final   Special Requests   Final    BOTTLES DRAWN AEROBIC AND ANAEROBIC 6CC DRAWN BY RN   Culture NO GROWTH  < 12 HOURS  Final   Report Status PENDING  Incomplete  MRSA PCR Screening     Status: None   Collection Time: 01/08/15  1:33 AM  Result Value Ref Range Status   MRSA by PCR NEGATIVE NEGATIVE Final    Comment:        The GeneXpert MRSA Assay (FDA approved for NASAL specimens only), is one component of a comprehensive MRSA colonization surveillance program. It is not intended to diagnose MRSA infection nor to guide or monitor treatment for MRSA infections.    Medical History: Past Medical History  Diagnosis Date  . Cardiomyopathy, nonischemic     a. 1999 nl cath;  b. 12/05 [facility] [place];  c. 10/2005 ICD extraction 2/2 enterococcus bacteremia and Veg on RV lead;  c. 05/2008 low risk Myoview (scarring w/ some evidence of inf ischemia);  d. 11/2012 Echo: EF 15-20%;  e. 01/2013 s/p MDT [place] [facility] CRT D, ser # [id number];  f. 05/2013 Echo: EF 15%.  . Enterococcal infection     a. 10/2005 - AICD-explanted  . Pulmonary embolism     a. 07/2005 after total right hip arthroplasty  . Hilar density     a. infrahilar mass/adenopathy on CT scan 5/07; subsequently  resolved  . LBBB (left bundle branch block)   . GERD (gastroesophageal reflux disease)   .  Hyperlipidemia   . Hypertension   . Tobacco abuse     a. discontinued in 1997, and then resumed  . Urinary incontinence   . Anemia     a. mild/chronic  . Villous adenoma of colon     a. tubovillous adenomatous polyp with focal high grade dysplasia; presented with hematochezia - followed by Dr. Laural Golden.  . Implantable cardioverter-defibrillator-CRT- Mdt     a.  01/2013 s/p MDT [place] [facility] CRT D, ser # [id number]  . Chronic systolic CHF (congestive heart failure)     a. 11/2012 Echo: EF 15-20%;  b. 05/2013 TEE EF 15%.  . Pneumonia 07/2005  . Obstructive sleep apnea     a. mild-did not tolerate CPAP (01/24/2013)  . History of blood transfusion   . Degenerative joint disease     of knees, shoulder, and hips  . PAF (paroxysmal atrial fibrillation)     a.  07/2012 s/p TEE/DCCV;  b. chronic coumadin;  c. 05/2013 Recurrent Afib->TEE/DCCV and amio initiation.  . CKD (chronic kidney disease), stage III     creatinin-1.44 in 1/09; 1.51 in 1/10  . Type II diabetes mellitus     type 2    Anti-infectives    Start     Dose/Rate Route Frequency Ordered Stop   01/08/15 1800  vancomycin (VANCOCIN) IVPB 750 mg/150 ml premix     750 mg 150 mL/hr over 60 Minutes Intravenous Every 24 hours 01/08/15 0044     01/08/15 1000  piperacillin-tazobactam (ZOSYN) IVPB 3.375 g     3.375 g 12.5 mL/hr over 240 Minutes Intravenous Every 8 hours 01/08/15 0825     01/08/15 0900  levofloxacin (LEVAQUIN) IVPB 750 mg     750 mg 100 mL/hr over 90 Minutes Intravenous Every 48 hours 01/08/15 0827     01/07/15 2315  vancomycin (VANCOCIN) IVPB 1000 mg/200 mL premix     1,000 mg 200 mL/hr over 60 Minutes Intravenous  Once 01/07/15 2309 01/08/15 0034   01/07/15 2145  cefTRIAXone (ROCEPHIN) 1 g in dextrose 5 % 50 mL IVPB     1 g 100 mL/hr over 30 Minutes Intravenous  Once 01/07/15 2134 01/07/15 2218   01/07/15 2145  azithromycin (ZITHROMAX) 500 mg in dextrose 5 % 250 mL IVPB  Status:  Discontinued     500 mg 250 mL/hr over 60 Minutes Intravenous  Once 01/07/15 2134 01/07/15 2251     Assessment: 69yo female c/o fever, chill, and SOB.  Asked to initiate Vancomycin, Zosyn, and Levaquin for suspected pna. Pt received Rocephin in ED on admission, Zithromax ordered but not given (discontinued by provider).   Estimated Creatinine Clearance: 30.9 mL/min (by C-G formula based on Cr of 1.63).  Goal of Therapy:  Vancomycin trough level 15-20 mcg/ml  Plan:  Vancomycin 750mg  IV q24hrs Check trough at steady state Levaquin 750mg  IV q48hrs Switch to PO when improved / appropriate Zosyn 3.375gm IV q8h, each dose over 4 hrs Monitor labs, renal fxn, progress, and c/s  Ena Dawley, RPH 01/08/2015,8:28 AM

## 2015-01-08 NOTE — Progress Notes (Addendum)
[facility] PROGRESS NOTE  [name] TML:[id number] DOB: 01-12-1946 DOA: 01/07/2015 PCP: Robert Bellow, MD  Assessment/Plan: 1. Acute respiratory failure with hypoxia related to pneumonia. We'll try and wean off oxygen as tolerated. Appears comfortable at this time. 2. Healthcare associated pneumonia. On vancomycin and Zosyn. Follow-up sputum cultures and urinary antigens. Pneumococcal antigen is negative thus far. 3. Recent enterococcal bacteremia. Discussed with infectious disease, Dr. Johnnye Sima. Recommendations are to continue on vancomycin and Zosyn for pneumonia during her hospital stay but should be counted towards her antibiotic days for enterococcal bacteremia. When she is discharged from the hospital, she should continue on ceftriaxone and ampicillin as previously prescribed for bacteremia until 01/23/15 4. CKD stage III. Creatinine is currently at baseline. Continue to follow. 5. Paroxysmal atrial fibrillation. Currently in sinus rhythm. Anticoagulated on Coumadin. 6. Anemia of chronic disease. Appears to be near her baseline. No evidence of bleeding at this time. Check stool FOBT. Continue to monitor  7. Chronic combined systolic and diastolic congestive heart failure. Appears compensated at this time. Continue Lasix, beta blockers and ACE inhibitor. 8. Diabetes. Hold oral agents, continue sliding scale insulin  Code Status: full code Family Communication: discussed with patient Disposition Plan: discharge home once improved   Consultants:    Procedures:    Antibiotics:  Vancomycin 7/17>>  Zosyn 7/17>>  HPI/Subjective: Still short of breath. Has non productive cough. No chest pain  Objective: Filed Vitals:   01/08/15 1039  BP: 121/62  Pulse: 61  Temp:   Resp:     Intake/Output Summary (Last 24 hours) at 01/08/15 1315 Last data filed at 01/08/15 1302  Gross per 24 hour  Intake    600 ml  Output    200 ml  Net    400 ml   Filed Weights    01/07/15 1950 01/08/15 0054 01/08/15 0653  Weight: 67.586 kg (149 lb) 68.7 kg (151 lb 7.3 oz) 69 kg (152 lb 1.9 oz)    Exam:   General:  NAD  Cardiovascular: s1, s2, rrr  Respiratory: crackles at right base  Abdomen: soft, nt, nd, bs+  Musculoskeletal: no edema b/l   Data Reviewed: Basic Metabolic Panel:  Recent Labs Lab 01/02/15 1139 01/07/15 2048 01/08/15 0704  NA 137 138 140  K 3.9 3.8 3.8  CL 101 102 103  CO2 27 26 27   GLUCOSE 131* 193* 78  BUN 34* 35* 32*  CREATININE 2.04* 1.70* 1.63*  CALCIUM 8.9 8.7* 8.5*   Liver Function Tests:  Recent Labs Lab 01/02/15 1139 01/07/15 2048 01/08/15 0704  AST 15 14* 10*  ALT 13* 12* 10*  ALKPHOS 87 85 71  BILITOT 0.5 0.8 0.8  PROT 7.0 7.4 6.7  ALBUMIN 2.8* 3.3* 2.9*   No results for input(s): LIPASE, AMYLASE in the last 168 hours. No results for input(s): AMMONIA in the last 168 hours. CBC:  Recent Labs Lab 01/07/15 2048 01/08/15 0704  WBC 10.1 6.6  NEUTROABS 9.0*  --   HGB 8.8* 7.8*  HCT 26.2* 23.8*  MCV 95.3 96.0  PLT 200 170   Cardiac Enzymes: No results for input(s): CKTOTAL, CKMB, CKMBINDEX, TROPONINI in the last 168 hours. BNP (last 3 results)  Recent Labs  10/17/14 1909 11/30/14 0825  BNP 488.1* 1322.3*    ProBNP (last 3 results)  Recent Labs  04/11/14 1220 04/27/14 1045 05/30/14 1016  PROBNP 8376.0* 14381.0* 6690.0*    CBG:  Recent Labs Lab 01/08/15 0118 01/08/15 0754 01/08/15 0932 01/08/15 1143  GLUCAP 202*  73 152* 107*    Recent Results (from the past 240 hour(s))  Blood culture (routine x 2)     Status: None (Preliminary result)   Collection Time: 01/07/15  8:48 PM  Result Value Ref Range Status   Specimen Description A-LINE  Final   Special Requests   Final    BOTTLES DRAWN AEROBIC AND ANAEROBIC 6CC DRAWN BY RN   Culture NO GROWTH < 24 HOURS  Final   Report Status PENDING  Incomplete  Blood culture (routine x 2)     Status: None (Preliminary result)   Collection  Time: 01/07/15  8:48 PM  Result Value Ref Range Status   Specimen Description A-LINE  Final   Special Requests   Final    BOTTLES DRAWN AEROBIC AND ANAEROBIC 6CC DRAWN BY RN   Culture NO GROWTH < 24 HOURS  Final   Report Status PENDING  Incomplete  MRSA PCR Screening     Status: None   Collection Time: 01/08/15  1:33 AM  Result Value Ref Range Status   MRSA by PCR NEGATIVE NEGATIVE Final    Comment:        The GeneXpert MRSA Assay (FDA approved for NASAL specimens only), is one component of a comprehensive MRSA colonization surveillance program. It is not intended to diagnose MRSA infection nor to guide or monitor treatment for MRSA infections.      Studies: Dg Chest 2 View  01/07/2015   CLINICAL DATA:  Cough and fever  EXAM: CHEST - 2 VIEW  COMPARISON:  12/06/2014  FINDINGS: Cardiac shadow is stable. The previously seen pacing device is been removed in the interval. A right-sided PICC line is noted in the mid superior vena cava. Left lung is clear. Diffuse infiltrate is noted within the right lung projecting in the lower lobe. This is new from the prior exam. No acute bony abnormality is seen.  IMPRESSION: Right lower lobe pneumonia.   Electronically Signed   By: Inez Catalina M.D.   On: 01/07/2015 21:41    Scheduled Meds: . amiodarone  200 mg Oral Daily  . carvedilol  9.375 mg Oral BID WC  . digoxin  0.0625 mg Oral QODAY  . feeding supplement (ENSURE ENLIVE)  237 mL Oral BID BM  . ferrous sulfate  325 mg Oral BID  . furosemide  40 mg Oral BID  . glimepiride  2 mg Oral Daily  . guaiFENesin  1,200 mg Oral BID  . insulin aspart  0-5 Units Subcutaneous QHS  . insulin aspart  0-9 Units Subcutaneous TID WC  . ipratropium-albuterol  3 mL Nebulization Q6H  . isosorbide mononitrate  30 mg Oral Daily  . levothyroxine  25 mcg Oral QAC breakfast  . lisinopril  5 mg Oral Daily  . metoCLOPramide  5 mg Oral BID  . piperacillin-tazobactam (ZOSYN)  IV  3.375 g Intravenous Q8H  .  pravastatin  80 mg Oral QHS  . spironolactone  12.5 mg Oral Daily  . vancomycin  750 mg Intravenous Q24H  . warfarin  2.5 mg Oral Once  . Warfarin - Pharmacist Dosing Inpatient   Does not apply Q24H   Continuous Infusions:   Active Problems:   Type 2 diabetes, uncontrolled, with renal manifestation   CKD (chronic kidney disease), stage IV   Anemia of chronic disease   Healthcare-associated pneumonia   HCAP (healthcare-associated pneumonia)    Time spent: 97mins    Tessah Patchen  [facility] Pager [phone]. If 7PM-7AM, please contact night-coverage at  [email or web address], password [facility] 01/08/2015, 1:15 PM  LOS: 1 day

## 2015-01-08 NOTE — Progress Notes (Signed)
Notified by Alexus the NT that she has asked the patient on several occasions about checking her for incontinent episodes.  She refuses stating that she in a few minutes after she is asked each time.

## 2015-01-08 NOTE — Plan of Care (Signed)
Problem: Phase III Progression Outcomes Goal: Tolerating diet Outcome: Progressing Poor appetite.     

## 2015-01-08 NOTE — Progress Notes (Signed)
ANTICOAGULATION CONSULT NOTE - Initial Consult  Pharmacy Consult for Coumadin Indication: H/O PAF, PE  No Known Allergies  Patient Measurements: Height: 5\' 5"  (165.1 cm) Weight: 152 lb 1.9 oz (69 kg) IBW/kg (Calculated) : 57  Vital Signs: Temp: 98.5 F (36.9 C) (07/18 0653) Temp Source: Oral (07/18 0653) BP: 114/62 mmHg (07/18 0653) Pulse Rate: 74 (07/18 0653)  Labs:  Recent Labs  01/07/15 2048 01/08/15 0704 01/08/15 0850  HGB 8.8* 7.8*  --   HCT 26.2* 23.8*  --   PLT 200 170  --   LABPROT  --   --  18.2*  INR  --   --  1.50*  CREATININE 1.70* 1.63*  --    Estimated Creatinine Clearance: 30.9 mL/min (by C-G formula based on Cr of 1.63).  Medical History: Past Medical History  Diagnosis Date  . Cardiomyopathy, nonischemic     a. 1999 nl cath;  b. 12/05 [facility] [place];  c. 10/2005 ICD extraction 2/2 enterococcus bacteremia and Veg on RV lead;  c. 05/2008 low risk Myoview (scarring w/ some evidence of inf ischemia);  d. 11/2012 Echo: EF 15-20%;  e. 01/2013 s/p MDT [place] CRT D, ser # [id number];  f. 05/2013 Echo: EF 15%.  . Enterococcal infection     a. 10/2005 - AICD-explanted  . Pulmonary embolism     a. 07/2005 after total right hip arthroplasty  . Hilar density     a. infrahilar mass/adenopathy on CT scan 5/07; subsequently  resolved  . LBBB (left bundle branch block)   . GERD (gastroesophageal reflux disease)   . Hyperlipidemia   . Hypertension   . Tobacco abuse     a. discontinued in 1997, and then resumed  . Urinary incontinence   . Anemia     a. mild/chronic  . Villous adenoma of colon     a. tubovillous adenomatous polyp with focal high grade dysplasia; presented with hematochezia - followed by Dr. Laural Golden.  . Implantable cardioverter-defibrillator-CRT- Mdt     a.  01/2013 s/p MDT [place] CRT D, ser # [id number]  . Chronic systolic CHF (congestive heart failure)     a. 11/2012 Echo: EF 15-20%;  b. 05/2013 TEE EF 15%.  . Pneumonia 07/2005  . Obstructive  sleep apnea     a. mild-did not tolerate CPAP (01/24/2013)  . History of blood transfusion   . Degenerative joint disease     of knees, shoulder, and hips  . PAF (paroxysmal atrial fibrillation)     a. 07/2012 s/p TEE/DCCV;  b. chronic coumadin;  c. 05/2013 Recurrent Afib->TEE/DCCV and amio initiation.  . CKD (chronic kidney disease), stage III     creatinin-1.44 in 1/09; 1.51 in 1/10  . Type II diabetes mellitus     type 2   Medications:  Prescriptions prior to admission  Medication Sig Dispense Refill Last Dose  . amiodarone (PACERONE) 200 MG tablet Take 1 tablet (200 mg total) by mouth daily. 90 tablet 3 01/07/2015  . Ampicillin-Sulbactam 3 g in sodium chloride 0.9 % 100 mL Inject 3 g into the vein every 8 (eight) hours. 3 g 6 01/07/2015  . carvedilol (COREG) 3.125 MG tablet Take 3 tablets (9.375 mg total) by mouth 2 (two) times daily with a meal. 180 tablet 2 01/07/2015 at 0800  . cefTRIAXone (ROCEPHIN) 40 MG/ML IVPB Inject 50 mLs (2 g total) into the vein every 12 (twelve) hours. 50 mL 6 01/07/2015  . digoxin (LANOXIN) 0.125 MG tablet Take 0.5 tablets (  0.0625 mg total) by mouth every other day. 30 tablet 3 01/07/2015  . feeding supplement, ENSURE ENLIVE, (ENSURE ENLIVE) LIQD Take 237 mLs by mouth 2 (two) times daily between meals. (Patient taking differently: Take 237 mLs by mouth daily. ) 237 mL 12 01/07/2015  . ferrous sulfate 324 (65 FE) MG TBEC Take 1 tablet (325 mg total) by mouth 2 (two) times daily. 60 tablet  01/07/2015  . furosemide (LASIX) 40 MG tablet Take 1 tablet (40 mg total) by mouth 2 (two) times daily. 90 tablet 3 01/07/2015  . glimepiride (AMARYL) 2 MG tablet Take 2 mg by mouth daily.   01/07/2015  . isosorbide mononitrate (IMDUR) 30 MG 24 hr tablet Take 1 tablet (30 mg total) by mouth daily. 30 tablet 0 01/07/2015  . levothyroxine (SYNTHROID, LEVOTHROID) 25 MCG tablet Take 1 tablet (25 mcg total) by mouth daily before breakfast. 30 tablet 0 01/07/2015  . lisinopril  (PRINIVIL,ZESTRIL) 5 MG tablet Take 5 mg by mouth daily.   01/07/2015  . metoCLOPramide (REGLAN) 5 MG tablet Take 5 mg by mouth 2 (two) times daily.    01/07/2015  . ondansetron (ZOFRAN) 4 MG tablet Take 4 mg by mouth every 4 (four) hours as needed for nausea or vomiting.   01/07/2015  . oxyCODONE-acetaminophen (PERCOCET) 10-325 MG per tablet Take 1 tablet by mouth every 6 (six) hours as needed for pain.    01/07/2015  . polyethylene glycol (MIRALAX / GLYCOLAX) packet Take 17 g by mouth daily as needed for mild constipation.   01/07/2015  . pravastatin (PRAVACHOL) 40 MG tablet Take 80 mg by mouth at bedtime.    01/06/2015  . sodium chloride 0.9 % injection Inject 10 mLs into the vein every 12 (twelve) hours.   01/07/2015  . spironolactone (ALDACTONE) 25 MG tablet Take 0.5 tablets (12.5 mg total) by mouth daily. 45 tablet 3 01/07/2015  . warfarin (COUMADIN) 2.5 MG tablet Take 0.5 tablets (1.25 mg total) by mouth daily at 6 PM. (Patient taking differently: Take 1.25-2.5 mg by mouth daily at 6 PM. Takes 1 tablet on Mon, Wed, Fri, take 0.5 tablet on all other days) 10 tablet 0 01/06/2015  . colchicine 0.6 MG tablet Take 0.6 mg by mouth 2 (two) times daily as needed.    More Than A Month  . polyethylene glycol powder (GLYCOLAX/MIRALAX) powder Take 1 Container by mouth daily as needed for mild constipation.    Taking   Assessment: 69yo female with h/o PE AND PAF.  Pt on Coumadin PTA, home dose listed above.  INR is SUBtherapeutic on admission.  Pt on several meds that may interact with Warfarin.  Goal of Therapy:  INR 2-3 Monitor platelets by anticoagulation protocol: Yes   Plan:  Coumadin 2.5mg  po today x 1 INR daily  Alaric Gladwin A 01/08/2015,10:45 AM

## 2015-01-09 ENCOUNTER — Encounter: Payer: Self-pay | Admitting: Internal Medicine

## 2015-01-09 DIAGNOSIS — I5042 Chronic combined systolic (congestive) and diastolic (congestive) heart failure: Secondary | ICD-10-CM

## 2015-01-09 DIAGNOSIS — E118 Type 2 diabetes mellitus with unspecified complications: Secondary | ICD-10-CM

## 2015-01-09 LAB — LEGIONELLA ANTIGEN, URINE

## 2015-01-09 LAB — BASIC METABOLIC PANEL
Anion gap: 8 (ref 5–15)
BUN: 33 mg/dL — ABNORMAL HIGH (ref 6–20)
CALCIUM: 8.9 mg/dL (ref 8.9–10.3)
CHLORIDE: 103 mmol/L (ref 101–111)
CO2: 28 mmol/L (ref 22–32)
Creatinine, Ser: 1.7 mg/dL — ABNORMAL HIGH (ref 0.44–1.00)
GFR calc non Af Amer: 29 mL/min — ABNORMAL LOW (ref >60)
GFR, EST AFRICAN AMERICAN: 34 mL/min — AB (ref >60)
Glucose, Bld: 63 mg/dL — ABNORMAL LOW (ref 65–99)
Potassium: 3.7 mmol/L (ref 3.5–5.1)
Sodium: 139 mmol/L (ref 135–145)

## 2015-01-09 LAB — GLUCOSE, CAPILLARY
GLUCOSE-CAPILLARY: 57 mg/dL — AB (ref 65–99)
Glucose-Capillary: 68 mg/dL (ref 65–99)
Glucose-Capillary: 109 mg/dL — ABNORMAL HIGH (ref 65–99)
Glucose-Capillary: 144 mg/dL — ABNORMAL HIGH (ref 65–99)

## 2015-01-09 LAB — CBC
HCT: 24.7 % — ABNORMAL LOW (ref 36.0–46.0)
Hemoglobin: 8 g/dL — ABNORMAL LOW (ref 12.0–15.0)
MCH: 31.1 pg (ref 26.0–34.0)
MCHC: 32.4 g/dL (ref 30.0–36.0)
MCV: 96.1 fL (ref 78.0–100.0)
Platelets: 154 10*3/uL (ref 150–400)
RBC: 2.57 MIL/uL — ABNORMAL LOW (ref 3.87–5.11)
RDW: 17.1 % — AB (ref 11.5–15.5)
WBC: 4.7 10*3/uL (ref 4.0–10.5)

## 2015-01-09 LAB — HEMOGLOBIN A1C
Hgb A1c MFr Bld: 5.9 % — ABNORMAL HIGH (ref 4.8–5.6)
Mean Plasma Glucose: 123 mg/dL

## 2015-01-09 LAB — PROTIME-INR
INR: 1.45 (ref 0.00–1.49)
PROTHROMBIN TIME: 17.7 s — AB (ref 11.6–15.2)

## 2015-01-09 MED ORDER — WARFARIN SODIUM 2.5 MG PO TABS
2.5000 mg | ORAL_TABLET | Freq: Once | ORAL | Status: AC
  Administered 2015-01-09: 2.5 mg via ORAL
  Filled 2015-01-09: qty 1

## 2015-01-09 NOTE — Progress Notes (Signed)
[facility] PROGRESS NOTE  [name] HQP:[id number] DOB: 16-Sep-1945 DOA: 01/07/2015 PCP: Robert Bellow, MD  Assessment/Plan: 1. Acute respiratory failure with hypoxia related to pneumonia. Continue to wean off oxygen as tolerated. 2. Healthcare associated pneumonia. On vancomycin and Zosyn. Follow-up sputum cultures. Pneumococcal and Legionella antigen are negative. 3. Recent enterococcal bacteremia. Discussed with infectious disease, Dr. Johnnye Sima. Recommendations are to continue on vancomycin and Zosyn for pneumonia during her hospital stay, but these antibiotics should be counted towards her antibiotic days for enterococcal bacteremia. When she is discharged from the hospital, she should continue on ceftriaxone and ampicillin as previously prescribed for bacteremia until 01/23/15 4. CKD stage III. Creatinine is currently at baseline. Continue to follow. 5. Paroxysmal atrial fibrillation. Currently in sinus rhythm. Anticoagulated on Coumadin. 6. Anemia of chronic disease. Appears to be near her baseline. No evidence of bleeding at this time. Check stool FOBT. Continue to monitor  7. Chronic combined systolic and diastolic congestive heart failure. Appears compensated at this time. Continue Lasix, beta blockers and ACE inhibitor. 8. Diabetes. Hold oral agents, continue sliding scale insulin  Code Status: full code Family Communication: discussed with patient Disposition Plan: discharge home once improved   Consultants:    Procedures:    Antibiotics:  Vancomycin 7/17>>  Zosyn 7/17>>  HPI/Subjective: She continues to cough, shortness of breath is improving. No chest pain  Objective: Filed Vitals:   01/09/15 1508  BP: 107/51  Pulse: 62  Temp: 98.2 F (36.8 C)  Resp: 20    Intake/Output Summary (Last 24 hours) at 01/09/15 1706 Last data filed at 01/09/15 1300  Gross per 24 hour  Intake   1790 ml  Output      0 ml  Net   1790 ml   Filed Weights   01/07/15  1950 01/08/15 0054 01/08/15 0653  Weight: 67.586 kg (149 lb) 68.7 kg (151 lb 7.3 oz) 69 kg (152 lb 1.9 oz)    Exam:   General:  NAD, sitting up in bed  Cardiovascular: s1, s2, regular rate  Respiratory: crackles at right base, no wheezing  Abdomen: soft, nt, nd, bs+  Musculoskeletal: no edema b/l   Data Reviewed: Basic Metabolic Panel:  Recent Labs Lab 01/07/15 2048 01/08/15 0704 01/09/15 0700  NA 138 140 139  K 3.8 3.8 3.7  CL 102 103 103  CO2 26 27 28   GLUCOSE 193* 78 63*  BUN 35* 32* 33*  CREATININE 1.70* 1.63* 1.70*  CALCIUM 8.7* 8.5* 8.9   Liver Function Tests:  Recent Labs Lab 01/07/15 2048 01/08/15 0704  AST 14* 10*  ALT 12* 10*  ALKPHOS 85 71  BILITOT 0.8 0.8  PROT 7.4 6.7  ALBUMIN 3.3* 2.9*   No results for input(s): LIPASE, AMYLASE in the last 168 hours. No results for input(s): AMMONIA in the last 168 hours. CBC:  Recent Labs Lab 01/07/15 2048 01/08/15 0704 01/09/15 0700  WBC 10.1 6.6 4.7  NEUTROABS 9.0*  --   --   HGB 8.8* 7.8* 8.0*  HCT 26.2* 23.8* 24.7*  MCV 95.3 96.0 96.1  PLT 200 170 154   Cardiac Enzymes: No results for input(s): CKTOTAL, CKMB, CKMBINDEX, TROPONINI in the last 168 hours. BNP (last 3 results)  Recent Labs  10/17/14 1909 11/30/14 0825  BNP 488.1* 1322.3*    ProBNP (last 3 results)  Recent Labs  04/11/14 1220 04/27/14 1045 05/30/14 1016  PROBNP 8376.0* 14381.0* 6690.0*    CBG:  Recent Labs Lab 01/08/15 2009 01/09/15 0724 01/09/15  6378 01/09/15 1121 01/09/15 1623  GLUCAP 105* 57* 68 109* 144*    Recent Results (from the past 240 hour(s))  Blood culture (routine x 2)     Status: None (Preliminary result)   Collection Time: 01/07/15  8:48 PM  Result Value Ref Range Status   Specimen Description BLOOD PICC LINE DRAWN BY RN  Final   Special Requests BOTTLES DRAWN AEROBIC AND ANAEROBIC 6CC  Final   Culture NO GROWTH 2 DAYS  Final   Report Status PENDING  Incomplete  Blood culture (routine  x 2)     Status: None (Preliminary result)   Collection Time: 01/07/15  8:48 PM  Result Value Ref Range Status   Specimen Description BLOOD PICC LINE DRAWN BY RN  Final   Special Requests BOTTLES DRAWN AEROBIC AND ANAEROBIC 6CC  Final   Culture NO GROWTH 2 DAYS  Final   Report Status PENDING  Incomplete  MRSA PCR Screening     Status: None   Collection Time: 01/08/15  1:33 AM  Result Value Ref Range Status   MRSA by PCR NEGATIVE NEGATIVE Final    Comment:        The GeneXpert MRSA Assay (FDA approved for NASAL specimens only), is one component of a comprehensive MRSA colonization surveillance program. It is not intended to diagnose MRSA infection nor to guide or monitor treatment for MRSA infections.      Studies: Dg Chest 2 View  01/07/2015   CLINICAL DATA:  Cough and fever  EXAM: CHEST - 2 VIEW  COMPARISON:  12/06/2014  FINDINGS: Cardiac shadow is stable. The previously seen pacing device is been removed in the interval. A right-sided PICC line is noted in the mid superior vena cava. Left lung is clear. Diffuse infiltrate is noted within the right lung projecting in the lower lobe. This is new from the prior exam. No acute bony abnormality is seen.  IMPRESSION: Right lower lobe pneumonia.   Electronically Signed   By: Inez Catalina M.D.   On: 01/07/2015 21:41    Scheduled Meds: . amiodarone  200 mg Oral Daily  . carvedilol  9.375 mg Oral BID WC  . digoxin  0.0625 mg Oral QODAY  . feeding supplement (ENSURE ENLIVE)  237 mL Oral BID BM  . ferrous sulfate  325 mg Oral BID  . furosemide  40 mg Oral BID  . guaiFENesin  1,200 mg Oral BID  . insulin aspart  0-5 Units Subcutaneous QHS  . insulin aspart  0-9 Units Subcutaneous TID WC  . ipratropium-albuterol  3 mL Nebulization TID  . isosorbide mononitrate  30 mg Oral Daily  . levothyroxine  25 mcg Oral QAC breakfast  . lisinopril  5 mg Oral Daily  . metoCLOPramide  5 mg Oral BID  . piperacillin-tazobactam (ZOSYN)  IV  3.375 g  Intravenous Q8H  . pravastatin  80 mg Oral QHS  . spironolactone  12.5 mg Oral Daily  . vancomycin  750 mg Intravenous Q24H  . warfarin  2.5 mg Oral Once  . Warfarin - Pharmacist Dosing Inpatient   Does not apply Q24H   Continuous Infusions:   Active Problems:   Type 2 diabetes, uncontrolled, with renal manifestation   CKD (chronic kidney disease), stage IV   Chronic combined systolic and diastolic CHF (congestive heart failure)   Anemia of chronic disease   Enterococcal bacteremia   Healthcare-associated pneumonia   HCAP (healthcare-associated pneumonia)   Acute respiratory failure with hypoxia   Paroxysmal atrial  fibrillation   Diabetes mellitus type 2 with complications    Time spent: 55mins    MEMON,JEHANZEB  [facility] Pager [phone]. If 7PM-7AM, please contact night-coverage at [email or web address], password [facility] 01/09/2015, 5:06 PM  LOS: 2 days

## 2015-01-09 NOTE — Progress Notes (Signed)
ANTICOAGULATION CONSULT NOTE - Follow Up Consult  Pharmacy Consult for Warfarin Indication: atrial fibrillation and pulmonary embolus  No Known Allergies  Patient Measurements: Height: 5\' 5"  (165.1 cm) Weight: 152 lb 1.9 oz (69 kg) IBW/kg (Calculated) : 57  Vital Signs: Temp: 97.5 F (36.4 C) (07/19 0631) Temp Source: Oral (07/19 0631) BP: 122/59 mmHg (07/19 0631) Pulse Rate: 56 (07/19 0631)  Labs:  Recent Labs  01/07/15 2048 01/08/15 0704 01/08/15 0850 01/09/15 0700  HGB 8.8* 7.8*  --  8.0*  HCT 26.2* 23.8*  --  24.7*  PLT 200 170  --  154  LABPROT  --   --  18.2* 17.7*  INR  --   --  1.50* 1.45  CREATININE 1.70* 1.63*  --  1.70*    Estimated Creatinine Clearance: 29.6 mL/min (by C-G formula based on Cr of 1.7).   Medications:  Prescriptions prior to admission  Medication Sig Dispense Refill Last Dose  . amiodarone (PACERONE) 200 MG tablet Take 1 tablet (200 mg total) by mouth daily. 90 tablet 3 01/07/2015  . Ampicillin-Sulbactam 3 g in sodium chloride 0.9 % 100 mL Inject 3 g into the vein every 8 (eight) hours. 3 g 6 01/07/2015  . carvedilol (COREG) 3.125 MG tablet Take 3 tablets (9.375 mg total) by mouth 2 (two) times daily with a meal. 180 tablet 2 01/07/2015 at 0800  . cefTRIAXone (ROCEPHIN) 40 MG/ML IVPB Inject 50 mLs (2 g total) into the vein every 12 (twelve) hours. 50 mL 6 01/07/2015  . digoxin (LANOXIN) 0.125 MG tablet Take 0.5 tablets (0.0625 mg total) by mouth every other day. 30 tablet 3 01/07/2015  . feeding supplement, ENSURE ENLIVE, (ENSURE ENLIVE) LIQD Take 237 mLs by mouth 2 (two) times daily between meals. (Patient taking differently: Take 237 mLs by mouth daily. ) 237 mL 12 01/07/2015  . ferrous sulfate 324 (65 FE) MG TBEC Take 1 tablet (325 mg total) by mouth 2 (two) times daily. 60 tablet  01/07/2015  . furosemide (LASIX) 40 MG tablet Take 1 tablet (40 mg total) by mouth 2 (two) times daily. 90 tablet 3 01/07/2015  . glimepiride (AMARYL) 2 MG tablet Take  2 mg by mouth daily.   01/07/2015  . isosorbide mononitrate (IMDUR) 30 MG 24 hr tablet Take 1 tablet (30 mg total) by mouth daily. 30 tablet 0 01/07/2015  . levothyroxine (SYNTHROID, LEVOTHROID) 25 MCG tablet Take 1 tablet (25 mcg total) by mouth daily before breakfast. 30 tablet 0 01/07/2015  . lisinopril (PRINIVIL,ZESTRIL) 5 MG tablet Take 5 mg by mouth daily.   01/07/2015  . metoCLOPramide (REGLAN) 5 MG tablet Take 5 mg by mouth 2 (two) times daily.    01/07/2015  . ondansetron (ZOFRAN) 4 MG tablet Take 4 mg by mouth every 4 (four) hours as needed for nausea or vomiting.   01/07/2015  . oxyCODONE-acetaminophen (PERCOCET) 10-325 MG per tablet Take 1 tablet by mouth every 6 (six) hours as needed for pain.    01/07/2015  . polyethylene glycol (MIRALAX / GLYCOLAX) packet Take 17 g by mouth daily as needed for mild constipation.   01/07/2015  . pravastatin (PRAVACHOL) 40 MG tablet Take 80 mg by mouth at bedtime.    01/06/2015  . sodium chloride 0.9 % injection Inject 10 mLs into the vein every 12 (twelve) hours.   01/07/2015  . spironolactone (ALDACTONE) 25 MG tablet Take 0.5 tablets (12.5 mg total) by mouth daily. 45 tablet 3 01/07/2015  . warfarin (COUMADIN) 2.5 MG  tablet Take 0.5 tablets (1.25 mg total) by mouth daily at 6 PM. (Patient taking differently: Take 1.25-2.5 mg by mouth daily at 6 PM. Takes 1 tablet on Mon, Wed, Fri, take 0.5 tablet on all other days) 10 tablet 0 01/06/2015  . colchicine 0.6 MG tablet Take 0.6 mg by mouth 2 (two) times daily as needed.    More Than A Month  . polyethylene glycol powder (GLYCOLAX/MIRALAX) powder Take 1 Container by mouth daily as needed for mild constipation.    Taking    Assessment: 69yo female with h/o PE AND PAF. Pt on Coumadin PTA, home dose listed above. INR remains SUBtherapeutic. Pt on several meds that may interact with Warfarin.  Goal of Therapy:  INR 2-3   Plan:  Warfarin 2.5mg  PO x 1 Daily PT/INR  Pricilla Larsson 01/09/2015,8:37 AM

## 2015-01-09 NOTE — Plan of Care (Signed)
Pt SPO2 levels remained above 90% on RA at rest. Pt SPO2 levels also remained above 90% on RA with ambulation and activity X 1032ft. Pt will not need O2 for Discharge to home at this time.

## 2015-01-10 DIAGNOSIS — J9601 Acute respiratory failure with hypoxia: Secondary | ICD-10-CM

## 2015-01-10 DIAGNOSIS — N184 Chronic kidney disease, stage 4 (severe): Secondary | ICD-10-CM

## 2015-01-10 DIAGNOSIS — B952 Enterococcus as the cause of diseases classified elsewhere: Secondary | ICD-10-CM

## 2015-01-10 DIAGNOSIS — J189 Pneumonia, unspecified organism: Principal | ICD-10-CM

## 2015-01-10 DIAGNOSIS — R7881 Bacteremia: Secondary | ICD-10-CM

## 2015-01-10 LAB — GLUCOSE, CAPILLARY
GLUCOSE-CAPILLARY: 92 mg/dL (ref 65–99)
GLUCOSE-CAPILLARY: 105 mg/dL — AB (ref 65–99)
Glucose-Capillary: 144 mg/dL — ABNORMAL HIGH (ref 65–99)

## 2015-01-10 LAB — CBC
HEMATOCRIT: 24.4 % — AB (ref 36.0–46.0)
Hemoglobin: 8.1 g/dL — ABNORMAL LOW (ref 12.0–15.0)
MCH: 32 pg (ref 26.0–34.0)
MCHC: 33.2 g/dL (ref 30.0–36.0)
MCV: 96.4 fL (ref 78.0–100.0)
Platelets: 164 10*3/uL (ref 150–400)
RBC: 2.53 MIL/uL — AB (ref 3.87–5.11)
RDW: 16.9 % — AB (ref 11.5–15.5)
WBC: 3.9 10*3/uL — AB (ref 4.0–10.5)

## 2015-01-10 LAB — BASIC METABOLIC PANEL
Anion gap: 10 (ref 5–15)
BUN: 36 mg/dL — AB (ref 6–20)
CALCIUM: 8.9 mg/dL (ref 8.9–10.3)
CHLORIDE: 103 mmol/L (ref 101–111)
CO2: 27 mmol/L (ref 22–32)
Creatinine, Ser: 1.71 mg/dL — ABNORMAL HIGH (ref 0.44–1.00)
GFR calc Af Amer: 34 mL/min — ABNORMAL LOW (ref >60)
GFR, EST NON AFRICAN AMERICAN: 29 mL/min — AB (ref >60)
Glucose, Bld: 106 mg/dL — ABNORMAL HIGH (ref 65–99)
Potassium: 3.7 mmol/L (ref 3.5–5.1)
Sodium: 140 mmol/L (ref 135–145)

## 2015-01-10 LAB — PROTIME-INR
INR: 1.51 — ABNORMAL HIGH (ref 0.00–1.49)
PROTHROMBIN TIME: 18.3 s — AB (ref 11.6–15.2)

## 2015-01-10 MED ORDER — WARFARIN SODIUM 5 MG PO TABS: 2.5000 mg | ORAL_TABLET | Freq: Once | ORAL | Status: DC

## 2015-01-10 MED ORDER — WARFARIN SODIUM 2.5 MG PO TABS: 1.2500 mg | ORAL_TABLET | Freq: Every day | ORAL | Status: DC

## 2015-01-10 MED ORDER — HEPARIN SOD (PORK) LOCK FLUSH 100 UNIT/ML IV SOLN
250.0000 [IU] | INTRAVENOUS | Status: AC | PRN
  Administered 2015-01-10: 250 [IU]
  Filled 2015-01-10: qty 5

## 2015-01-10 NOTE — Discharge Summary (Signed)
Physician Discharge Summary  EVERLIE EBLE JOA:[id number] DOB: [birth year] DOA: 01/07/2015  PCP: Robert Bellow, MD  Admit date: 01/07/2015 Discharge date: 01/10/2015  Recommendations for Outpatient Follow-up:  1. Resolution of pneumonia 2. Ongoing treatment for enterococcal bacteremia. Has f/u appointment with Dr. Linus Salmons. 3. Resume HH RN   Follow-up Information    Follow up with Robert Bellow, MD. Schedule an appointment as soon as possible for a visit in 1 week.   Specialty:  Family Medicine   Contact information:   [place] [address] [phone]      Discharge Diagnoses:  1. Acute hypoxic respiratory failure 2. HCAP 3. Enterococcal bacteremia present on admission 4. CKD stage III-IV 5. Anemia of chronic disease 6. Chronic systolic CHF 7. DM  Discharge Condition: improved Disposition: home  Diet recommendation: heart healthy, diabetic diet  Filed Weights   01/07/15 1950 01/08/15 0054 01/08/15 0653  Weight: 67.586 kg (149 lb) 68.7 kg (151 lb 7.3 oz) 69 kg (152 lb 1.9 oz)    History of present illness:  69 year old woman with history of enterococcal bacteremia in June 2016, currently on ceftriaxone and Unasyn through August 2 who presented with cough and shortness of breath. She was admitted for acute hypoxic respiratory failure, HCAP  Hospital Course:  Ms. Polimeni rapidly improved with empiric abx and was successfully weaned off oxygen. Hospitalization was uncomplicated. Plan for discharge home on previous antibiotics as below.   Acute hypoxic respiratory failure. Resolved. Secondary to pneumonia. Off oxygen.  HCAP. Follows urine cultures. Pneumococcal and Legionella antigen negative. Continuing to improve.  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 Dr. Roderic Palau discussed with Dr. Johnnye Sima infectious disease. Recommendations were to continue vancomycin and Zosyn during hospital stay, on discharge continue  previous ceftriaxone and Unasyn until 01/23/2015.  Chronic kidney disease stage III-IV. Stable.   Paroxysmal atrial fibrillation. In sinus rhythm. Continue warfarin.  Anemia of chronic disease, iron deficiency anemia. On Aranesp.  Chronic combined systolic congestive heart failure. Continue Lasix, beta blocker, ACE inhibitor.  Diabetes mellitus.   Doing well. Plan home today. Discussed with Dr. Linus Salmons by telephone. Concurs with previous recs, recs home on Unasyn and ceftriaxone, no additional abx.  Consultants:  None   Procedures:  None   Antibiotics:  Vancomycin 7/17>> 7/20  Zosyn 7/17>> 7/20  Discharge Instructions  Discharge Instructions    Activity as tolerated - No restrictions    Complete by:  As directed      Diet - low sodium heart healthy    Complete by:  As directed      Diet Carb Modified    Complete by:  As directed      Discharge instructions    Complete by:  As directed   Call your physician or seek immediate medical attention for fever, shortness of breath, trouble breathing or worsening of condition.          Current Discharge Medication List    CONTINUE these medications which have CHANGED   Details  warfarin (COUMADIN) 2.5 MG tablet Take 0.5-1 tablets (1.25-2.5 mg total) by mouth daily at 6 PM. Takes 1 tablet on Mon, Wed, Fri, take 0.5 tablet on all other days      CONTINUE these medications which have NOT CHANGED   Details  amiodarone (PACERONE) 200 MG tablet Take 1 tablet (200 mg total) by mouth daily. Qty: 90 tablet, Refills: 3   Associated Diagnoses: Atrial fibrillation, unspecified    Ampicillin-Sulbactam 3 g in sodium  chloride 0.9 % 100 mL Inject 3 g into the vein every 8 (eight) hours. Qty: 3 g, Refills: 6    carvedilol (COREG) 3.125 MG tablet Take 3 tablets (9.375 mg total) by mouth 2 (two) times daily with a meal. Qty: 180 tablet, Refills: 2    cefTRIAXone (ROCEPHIN) 40 MG/ML IVPB Inject 50 mLs (2 g total) into the vein every  12 (twelve) hours. Qty: 50 mL, Refills: 6    digoxin (LANOXIN) 0.125 MG tablet Take 0.5 tablets (0.0625 mg total) by mouth every other day. Qty: 30 tablet, Refills: 3   Associated Diagnoses: Atrial fibrillation, persistent; Cardiomyopathy in other diseases classified elsewhere; Essential hypertension, benign; Atrial fibrillation    feeding supplement, ENSURE ENLIVE, (ENSURE ENLIVE) LIQD Take 237 mLs by mouth 2 (two) times daily between meals. Qty: 237 mL, Refills: 12    ferrous sulfate 324 (65 FE) MG TBEC Take 1 tablet (325 mg total) by mouth 2 (two) times daily. Qty: 60 tablet    furosemide (LASIX) 40 MG tablet Take 1 tablet (40 mg total) by mouth 2 (two) times daily. Qty: 90 tablet, Refills: 3    glimepiride (AMARYL) 2 MG tablet Take 2 mg by mouth daily.    isosorbide mononitrate (IMDUR) 30 MG 24 hr tablet Take 1 tablet (30 mg total) by mouth daily. Qty: 30 tablet, Refills: 0    levothyroxine (SYNTHROID, LEVOTHROID) 25 MCG tablet Take 1 tablet (25 mcg total) by mouth daily before breakfast. Qty: 30 tablet, Refills: 0    lisinopril (PRINIVIL,ZESTRIL) 5 MG tablet Take 5 mg by mouth daily.    metoCLOPramide (REGLAN) 5 MG tablet Take 5 mg by mouth 2 (two) times daily.     ondansetron (ZOFRAN) 4 MG tablet Take 4 mg by mouth every 4 (four) hours as needed for nausea or vomiting.    oxyCODONE-acetaminophen (PERCOCET) 10-325 MG per tablet Take 1 tablet by mouth every 6 (six) hours as needed for pain.     polyethylene glycol (MIRALAX / GLYCOLAX) packet Take 17 g by mouth daily as needed for mild constipation.    pravastatin (PRAVACHOL) 40 MG tablet Take 80 mg by mouth at bedtime.     spironolactone (ALDACTONE) 25 MG tablet Take 0.5 tablets (12.5 mg total) by mouth daily. Qty: 45 tablet, Refills: 3    colchicine 0.6 MG tablet Take 0.6 mg by mouth 2 (two) times daily as needed.       STOP taking these medications     sodium chloride 0.9 % injection      polyethylene glycol powder  (GLYCOLAX/MIRALAX) powder        No Known Allergies  The results of significant diagnostics from this hospitalization (including imaging, microbiology, ancillary and laboratory) are listed below for reference.    Significant Diagnostic Studies: Dg Chest 2 View  01/07/2015   CLINICAL DATA:  Cough and fever  EXAM: CHEST - 2 VIEW  COMPARISON:  12/06/2014  FINDINGS: Cardiac shadow is stable. The previously seen pacing device is been removed in the interval. A right-sided PICC line is noted in the mid superior vena cava. Left lung is clear. Diffuse infiltrate is noted within the right lung projecting in the lower lobe. This is new from the prior exam. No acute bony abnormality is seen.  IMPRESSION: Right lower lobe pneumonia.   Electronically Signed   By: Inez Catalina M.D.   On: 01/07/2015 21:41   Microbiology: Recent Results (from the past 240 hour(s))  Blood culture (routine x 2)  Status: None (Preliminary result)   Collection Time: 01/07/15  8:48 PM  Result Value Ref Range Status   Specimen Description BLOOD PICC LINE DRAWN BY RN  Final   Special Requests BOTTLES DRAWN AEROBIC AND ANAEROBIC 6CC  Final   Culture NO GROWTH 3 DAYS  Final   Report Status PENDING  Incomplete  Blood culture (routine x 2)     Status: None (Preliminary result)   Collection Time: 01/07/15  8:48 PM  Result Value Ref Range Status   Specimen Description BLOOD PICC LINE DRAWN BY RN  Final   Special Requests BOTTLES DRAWN AEROBIC AND ANAEROBIC 6CC  Final   Culture NO GROWTH 3 DAYS  Final   Report Status PENDING  Incomplete  MRSA PCR Screening     Status: None   Collection Time: 01/08/15  1:33 AM  Result Value Ref Range Status   MRSA by PCR NEGATIVE NEGATIVE Final    Comment:        The GeneXpert MRSA Assay (FDA approved for NASAL specimens only), is one component of a comprehensive MRSA colonization surveillance program. It is not intended to diagnose MRSA infection nor to guide or monitor treatment  for MRSA infections.      Labs: Basic Metabolic Panel:  Recent Labs Lab 01/07/15 2048 01/08/15 0704 01/09/15 0700 01/10/15 0637  NA 138 140 139 140  K 3.8 3.8 3.7 3.7  CL 102 103 103 103  CO2 26 27 28 27   GLUCOSE 193* 78 63* 106*  BUN 35* 32* 33* 36*  CREATININE 1.70* 1.63* 1.70* 1.71*  CALCIUM 8.7* 8.5* 8.9 8.9   Liver Function Tests:  Recent Labs Lab 01/07/15 2048 01/08/15 0704  AST 14* 10*  ALT 12* 10*  ALKPHOS 85 71  BILITOT 0.8 0.8  PROT 7.4 6.7  ALBUMIN 3.3* 2.9*   CBC:  Recent Labs Lab 01/07/15 2048 01/08/15 0704 01/09/15 0700 01/10/15 0637  WBC 10.1 6.6 4.7 3.9*  NEUTROABS 9.0*  --   --   --   HGB 8.8* 7.8* 8.0* 8.1*  HCT 26.2* 23.8* 24.7* 24.4*  MCV 95.3 96.0 96.1 96.4  PLT 200 170 154 164   CBG:  Recent Labs Lab 01/09/15 1121 01/09/15 1623 01/09/15 2225 01/10/15 0738 01/10/15 1120  GLUCAP 109* 144* 92 105* 144*    Principal Problem:   Acute respiratory failure with hypoxia Active Problems:   Type 2 diabetes, uncontrolled, with renal manifestation   CKD (chronic kidney disease), stage IV   Anemia of chronic disease   Enterococcal bacteremia   HCAP (healthcare-associated pneumonia)   Paroxysmal atrial fibrillation   Diabetes mellitus type 2 with complications   Time coordinating discharge: 35 minutes  Signed:  Murray Hodgkins, MD [facility] 01/10/2015, 12:43 PM

## 2015-01-10 NOTE — Progress Notes (Signed)
Patient discharged home with husband.  Reviewed medications.  HH to resume.  PICC line flushed with heparin and left in place.  Instructed to follow up with PCP.  No questions at this time.  Stable to DC home.  Left floor via [place] with staff

## 2015-01-10 NOTE — Care Management Note (Signed)
Case Management Note  Patient Details  Name: JACKY HARTUNG MRN: [id number] Date of Birth: [birth year]  Subjective/Objective:                    Action/Plan:   Expected Discharge Date:  01/11/15               Expected Discharge Plan:  [place]  In-House Referral:  NA  Discharge planning Services  CM Consult  Post Acute Care Choice:  Resumption of Svcs/PTA Provider Choice offered to:  Patient  DME Arranged:    DME Agency:     HH Arranged:  RN, IV Antibiotics HH Agency:  [place]  Status of Service:  Completed, signed off  Medicare Important Message Given:    Date Medicare IM Given:    Medicare IM give by:    Date Additional Medicare IM Given:    Additional Medicare Important Message give by:     If discussed at [place] of Stay Meetings, dates discussed:    Additional Comments: Pt discharged home today with resumption of [facility] for IV AB (per pts choice). Romualdo Bolk of [facility] is aware and will collect the pts information from the chart. [place] services to resume at discharge. No DME needs noted. Pt and pts nurse aware of discharge arrangements. Christinia Gully Macy, RN 01/10/2015, 1:21 PM

## 2015-01-10 NOTE — Plan of Care (Signed)
Problem: Phase III Progression Outcomes Goal: Convert IV antibiotics to PO Outcome: Adequate for Discharge Patient previously on IV abx at home prior to admission -will be continued

## 2015-01-10 NOTE — Progress Notes (Signed)
PROGRESS NOTE  [name] YTK:[id number] DOB: 1945-09-11 DOA: 01/07/2015 PCP: Robert Bellow, MD  Infectious disease: Thayer Headings, MD   Summary: 69 year old woman with history of enterococcal bacteremia in June 2016, currently on ceftriaxone and Unasyn through August 2 who presented with cough and shortness of breath. She was admitted for acute hypoxic respiratory failure, HCAP  Assessment/Plan: 1. Acute hypoxic respiratory failure. Resolved. Secondary to pneumonia. Off oxygen. 2. HCAP. Follows urine cultures. Pneumococcal and Legionella antigen negative. Continuing to improve. 3. 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 Dr. Roderic Palau discussed with Dr. Johnnye Sima infectious disease. Recommendations were to continue vancomycin and Zosyn during hospital stay, on discharge continue previous ceftriaxone and Unasyn until 01/23/2015. 4. Chronic kidney disease stage III-IV. Stable.  5. Paroxysmal atrial fibrillation. In sinus rhythm. Continue warfarin. 6. Anemia of chronic disease, iron deficiency anemia. On Aranesp. 7. Chronic combined systolic congestive heart failure. Continue Lasix, beta blocker, ACE inhibitor. 8. Diabetes mellitus.   Doing well. Plan home today. Discussed with Dr. Linus Salmons by telephone. Concurs with previous recs, recs home on Unasyn and ceftriaxone, no additional abx.  Has f/u with Dr. Linus Salmons next month. .  Resume HH RN  Murray Hodgkins, MD  [facility]  Pager [phone] If 7PM-7AM, please contact night-coverage at [email or web address], password [facility] 01/10/2015, 11:09 AM  LOS: 3 days   Consultants:    Procedures:    Antibiotics:  Vancomycin 7/17>>  Zosyn 7/17>>  HPI/Subjective: Feels good. Breathing well. No n/v. Eating ok.  Objective: Filed Vitals:   01/09/15 1508 01/09/15 2219 01/10/15 0623 01/10/15 0719  BP: 107/51 128/64 105/70   Pulse: 62 65 60   Temp: 98.2 F (36.8 C) 98.5 F (36.9 C) 98.5 F (36.9 C)     TempSrc: Oral Oral Oral   Resp: 20 20 17    Height:      Weight:      SpO2: 99% 100% 100% 98%    Intake/Output Summary (Last 24 hours) at 01/10/15 1109 Last data filed at 01/10/15 0800  Gross per 24 hour  Intake    720 ml  Output      0 ml  Net    720 ml     Filed Weights   01/07/15 1950 01/08/15 0054 01/08/15 0653  Weight: 67.586 kg (149 lb) 68.7 kg (151 lb 7.3 oz) 69 kg (152 lb 1.9 oz)    Exam:     Afebrile, VSS, no hypoxia General: Appears calm and comfortable Cardiovascular: RRR, no m/r/g. No LE edema. Telemetry: SR, no arrhythmias  Respiratory: CTA bilaterally, no w/r/r. Normal respiratory effort. Psychiatric: grossly normal mood and affect, speech fluent and appropriate  New data reviewed:  Basic metabolic panel without significant change, BUN 36, creatinine 1.71. Appears to be at baseline.  Hemoglobin stable 8.1. Platelet count 164. WBC 3.9.  Scheduled Meds: . amiodarone  200 mg Oral Daily  . carvedilol  9.375 mg Oral BID WC  . digoxin  0.0625 mg Oral QODAY  . feeding supplement (ENSURE ENLIVE)  237 mL Oral BID BM  . ferrous sulfate  325 mg Oral BID  . furosemide  40 mg Oral BID  . guaiFENesin  1,200 mg Oral BID  . insulin aspart  0-5 Units Subcutaneous QHS  . insulin aspart  0-9 Units Subcutaneous TID WC  . ipratropium-albuterol  3 mL Nebulization TID  . isosorbide mononitrate  30 mg Oral Daily  . levothyroxine  25 mcg Oral QAC breakfast  . lisinopril  5 mg  Oral Daily  . metoCLOPramide  5 mg Oral BID  . piperacillin-tazobactam (ZOSYN)  IV  3.375 g Intravenous Q8H  . pravastatin  80 mg Oral QHS  . spironolactone  12.5 mg Oral Daily  . vancomycin  750 mg Intravenous Q24H  . warfarin  2.5 mg Oral Once  . Warfarin - Pharmacist Dosing Inpatient   Does not apply Q24H   Continuous Infusions:   Principal Problem:   Acute respiratory failure with hypoxia Active Problems:   Type 2 diabetes, uncontrolled, with renal manifestation   CKD (chronic kidney  disease), stage IV   Anemia of chronic disease   Enterococcal bacteremia   HCAP (healthcare-associated pneumonia)   Paroxysmal atrial fibrillation   Diabetes mellitus type 2 with complications

## 2015-01-10 NOTE — Progress Notes (Signed)
ANTICOAGULATION CONSULT NOTE - Follow Up Consult  Pharmacy Consult for Warfarin Indication: atrial fibrillation and pulmonary embolus  No Known Allergies  Patient Measurements: Height: 5\' 5"  (165.1 cm) Weight: 152 lb 1.9 oz (69 kg) IBW/kg (Calculated) : 57  Vital Signs: Temp: 98.5 F (36.9 C) (07/20 0623) Temp Source: Oral (07/20 0623) BP: 105/70 mmHg (07/20 0623) Pulse Rate: 60 (07/20 0623)  Labs:  Recent Labs  01/08/15 0704 01/08/15 0850 01/09/15 0700 01/10/15 0637  HGB 7.8*  --  8.0* 8.1*  HCT 23.8*  --  24.7* 24.4*  PLT 170  --  154 164  LABPROT  --  18.2* 17.7* 18.3*  INR  --  1.50* 1.45 1.51*  CREATININE 1.63*  --  1.70* 1.71*   Estimated Creatinine Clearance: 29.4 mL/min (by C-G formula based on Cr of 1.71).  Medications:  Prescriptions prior to admission  Medication Sig Dispense Refill Last Dose  . amiodarone (PACERONE) 200 MG tablet Take 1 tablet (200 mg total) by mouth daily. 90 tablet 3 01/07/2015  . Ampicillin-Sulbactam 3 g in sodium chloride 0.9 % 100 mL Inject 3 g into the vein every 8 (eight) hours. 3 g 6 01/07/2015  . carvedilol (COREG) 3.125 MG tablet Take 3 tablets (9.375 mg total) by mouth 2 (two) times daily with a meal. 180 tablet 2 01/07/2015 at 0800  . cefTRIAXone (ROCEPHIN) 40 MG/ML IVPB Inject 50 mLs (2 g total) into the vein every 12 (twelve) hours. 50 mL 6 01/07/2015  . digoxin (LANOXIN) 0.125 MG tablet Take 0.5 tablets (0.0625 mg total) by mouth every other day. 30 tablet 3 01/07/2015  . feeding supplement, ENSURE ENLIVE, (ENSURE ENLIVE) LIQD Take 237 mLs by mouth 2 (two) times daily between meals. (Patient taking differently: Take 237 mLs by mouth daily. ) 237 mL 12 01/07/2015  . ferrous sulfate 324 (65 FE) MG TBEC Take 1 tablet (325 mg total) by mouth 2 (two) times daily. 60 tablet  01/07/2015  . furosemide (LASIX) 40 MG tablet Take 1 tablet (40 mg total) by mouth 2 (two) times daily. 90 tablet 3 01/07/2015  . glimepiride (AMARYL) 2 MG tablet Take  2 mg by mouth daily.   01/07/2015  . isosorbide mononitrate (IMDUR) 30 MG 24 hr tablet Take 1 tablet (30 mg total) by mouth daily. 30 tablet 0 01/07/2015  . levothyroxine (SYNTHROID, LEVOTHROID) 25 MCG tablet Take 1 tablet (25 mcg total) by mouth daily before breakfast. 30 tablet 0 01/07/2015  . lisinopril (PRINIVIL,ZESTRIL) 5 MG tablet Take 5 mg by mouth daily.   01/07/2015  . metoCLOPramide (REGLAN) 5 MG tablet Take 5 mg by mouth 2 (two) times daily.    01/07/2015  . ondansetron (ZOFRAN) 4 MG tablet Take 4 mg by mouth every 4 (four) hours as needed for nausea or vomiting.   01/07/2015  . oxyCODONE-acetaminophen (PERCOCET) 10-325 MG per tablet Take 1 tablet by mouth every 6 (six) hours as needed for pain.    01/07/2015  . polyethylene glycol (MIRALAX / GLYCOLAX) packet Take 17 g by mouth daily as needed for mild constipation.   01/07/2015  . pravastatin (PRAVACHOL) 40 MG tablet Take 80 mg by mouth at bedtime.    01/06/2015  . sodium chloride 0.9 % injection Inject 10 mLs into the vein every 12 (twelve) hours.   01/07/2015  . spironolactone (ALDACTONE) 25 MG tablet Take 0.5 tablets (12.5 mg total) by mouth daily. 45 tablet 3 01/07/2015  . warfarin (COUMADIN) 2.5 MG tablet Take 0.5 tablets (1.25 mg  total) by mouth daily at 6 PM. (Patient taking differently: Take 1.25-2.5 mg by mouth daily at 6 PM. Takes 1 tablet on Mon, Wed, Fri, take 0.5 tablet on all other days) 10 tablet 0 01/06/2015  . colchicine 0.6 MG tablet Take 0.6 mg by mouth 2 (two) times daily as needed.    More Than A Month  . polyethylene glycol powder (GLYCOLAX/MIRALAX) powder Take 1 Container by mouth daily as needed for mild constipation.    Taking   Assessment: 69yo female with h/o PE AND PAF. Pt on Coumadin PTA, home dose listed above. INR remains SUBtherapeutic. INR is trending up toward goal.  Pt on several meds that may interact with Warfarin.  CBC stable.   Goal of Therapy:  INR 2-3   Plan:  Repeat Warfarin 2.5mg  PO x 1 Daily  PT/INR  Cheyne Bungert A 01/10/2015,10:40 AM

## 2015-01-10 NOTE — Care Management Important Message (Signed)
Important Message  Patient Details  Name: [name] MRN: [id number] Date of Birth: [birth year]   Medicare Important Message Given:  Yes-second notification given    Joylene Draft, RN 01/10/2015, 1:22 PM

## 2015-01-11 ENCOUNTER — Encounter: Payer: Self-pay | Admitting: *Deleted

## 2015-01-11 ENCOUNTER — Other Ambulatory Visit: Payer: Self-pay | Admitting: *Deleted

## 2015-01-11 NOTE — Patient Outreach (Signed)
TOC week #1 Transition of care program restarted, due to hospitalization for pneumonia Call to patient, spoke with patient, spouse was outside and unavailable. Subjective: Patient reports she was in the hospital for pneumonia. 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 Patient states she is weighing daily, spouse managing meds and checking her BP and recording weights and BP. Patient reports [place] restarted, nurse was out this morning.  Assessment: Call completed with patient No new needs or concerns voiced during call  Plan: RNCM will continue to call and visit for Transition of care program, monitor for care management needs Royetta Crochet. Laymond Purser, RN, BSN, [place] [phone]

## 2015-01-12 LAB — CULTURE, BLOOD (ROUTINE X 2)
CULTURE: NO GROWTH
CULTURE: NO GROWTH

## 2015-01-15 ENCOUNTER — Other Ambulatory Visit (HOSPITAL_COMMUNITY)
Admission: RE | Admit: 2015-01-15 | Discharge: 2015-01-15 | Disposition: A | Discharge status: Home / Self Care | Payer: Commercial Managed Care - HMO | Source: Skilled Nursing Facility | Attending: Internal Medicine | Admitting: Internal Medicine

## 2015-01-15 DIAGNOSIS — Z5181 Encounter for therapeutic drug level monitoring: Secondary | ICD-10-CM | POA: Diagnosis present

## 2015-01-15 LAB — CBC WITH DIFFERENTIAL/PLATELET
BASOS PCT: 0 % (ref 0–1)
Basophils Absolute: 0 10*3/uL (ref 0.0–0.1)
Eosinophils Absolute: 0.2 10*3/uL (ref 0.0–0.7)
Eosinophils Relative: 4 % (ref 0–5)
HEMATOCRIT: 25.9 % — AB (ref 36.0–46.0)
HEMOGLOBIN: 8.4 g/dL — AB (ref 12.0–15.0)
LYMPHS ABS: 0.7 10*3/uL (ref 0.7–4.0)
LYMPHS PCT: 15 % (ref 12–46)
MCH: 31.8 pg (ref 26.0–34.0)
MCHC: 32.4 g/dL (ref 30.0–36.0)
MCV: 98.1 fL (ref 78.0–100.0)
Monocytes Absolute: 0.4 10*3/uL (ref 0.1–1.0)
Monocytes Relative: 7 % (ref 3–12)
NEUTROS ABS: 3.6 10*3/uL (ref 1.7–7.7)
Neutrophils Relative %: 74 % (ref 43–77)
Platelets: 188 10*3/uL (ref 150–400)
RBC: 2.64 MIL/uL — AB (ref 3.87–5.11)
RDW: 16.7 % — ABNORMAL HIGH (ref 11.5–15.5)
WBC: 4.9 10*3/uL (ref 4.0–10.5)

## 2015-01-15 LAB — BASIC METABOLIC PANEL
Anion gap: 10 (ref 5–15)
BUN: 37 mg/dL — ABNORMAL HIGH (ref 6–20)
CO2: 28 mmol/L (ref 22–32)
CREATININE: 1.86 mg/dL — AB (ref 0.44–1.00)
Calcium: 9.1 mg/dL (ref 8.9–10.3)
Chloride: 102 mmol/L (ref 101–111)
GFR calc Af Amer: 30 mL/min — ABNORMAL LOW (ref >60)
GFR calc non Af Amer: 26 mL/min — ABNORMAL LOW (ref >60)
GLUCOSE: 144 mg/dL — AB (ref 65–99)
Potassium: 4 mmol/L (ref 3.5–5.1)
SODIUM: 140 mmol/L (ref 135–145)

## 2015-01-15 LAB — C-REACTIVE PROTEIN: CRP: 1 mg/dL — AB (ref <1.0)

## 2015-01-15 LAB — SEDIMENTATION RATE: Sed Rate: 90 mm/hr — ABNORMAL HIGH (ref 0–22)

## 2015-01-17 ENCOUNTER — Telehealth: Payer: Self-pay | Admitting: *Deleted

## 2015-01-17 NOTE — Telephone Encounter (Signed)
Received fax from [facility] with latest BMP, creatinine and BUN are still elevated. Please advise.  All results visible in EPIC. Landis Gandy, RN

## 2015-01-18 ENCOUNTER — Other Ambulatory Visit: Payer: Self-pay | Admitting: *Deleted

## 2015-01-18 NOTE — Telephone Encounter (Signed)
It looks pretty stable from previous levels and so would not be related to medications.  If she is concerned about it, could see nephrology. thanks

## 2015-01-18 NOTE — Patient Outreach (Signed)
TOC week #2  Call to patient home Spoke with patient. Subjective: Patient reporting she is doing good, actually having more energy to do some things like walk around home, make up her bed. Patient states she has one more week of IV antibiotics, spouse still assisting with administering medication. Patient weight stable at 147# States her blood sugars are good, but she cannot remember what the number was but spouse is keeping a record of them. Patient states Home health still coming out, they were there this am, no new issues reported.  Assessment: No new concerns this call.  Plan:  Continue to call for [facility] program. Visit on 01/31/15 as scheduled Tina Kidney E. Laymond Purser, RN, BSN, [place] [phone]

## 2015-01-19 ENCOUNTER — Other Ambulatory Visit (HOSPITAL_COMMUNITY): Payer: Self-pay | Admitting: Respiratory Therapy

## 2015-01-22 ENCOUNTER — Encounter (HOSPITAL_COMMUNITY): Payer: Commercial Managed Care - HMO

## 2015-01-22 ENCOUNTER — Encounter (HOSPITAL_COMMUNITY): Payer: Commercial Managed Care - HMO | Attending: Hematology & Oncology

## 2015-01-22 ENCOUNTER — Other Ambulatory Visit (HOSPITAL_COMMUNITY)
Admission: RE | Admit: 2015-01-22 | Discharge: 2015-01-22 | Disposition: A | Discharge status: Home / Self Care | Payer: Commercial Managed Care - HMO | Source: Other Acute Inpatient Hospital | Attending: Internal Medicine | Admitting: Internal Medicine

## 2015-01-22 ENCOUNTER — Telehealth: Payer: Self-pay | Admitting: Licensed Clinical Social Worker

## 2015-01-22 VITALS — BP 137/66 | HR 66 | Temp 98.2°F | Resp 16

## 2015-01-22 DIAGNOSIS — Z7901 Long term (current) use of anticoagulants: Secondary | ICD-10-CM | POA: Insufficient documentation

## 2015-01-22 DIAGNOSIS — N189 Chronic kidney disease, unspecified: Secondary | ICD-10-CM | POA: Insufficient documentation

## 2015-01-22 DIAGNOSIS — D638 Anemia in other chronic diseases classified elsewhere: Secondary | ICD-10-CM

## 2015-01-22 DIAGNOSIS — N184 Chronic kidney disease, stage 4 (severe): Secondary | ICD-10-CM

## 2015-01-22 DIAGNOSIS — Z5181 Encounter for therapeutic drug level monitoring: Secondary | ICD-10-CM | POA: Insufficient documentation

## 2015-01-22 DIAGNOSIS — D649 Anemia, unspecified: Secondary | ICD-10-CM

## 2015-01-22 DIAGNOSIS — D631 Anemia in chronic kidney disease: Secondary | ICD-10-CM

## 2015-01-22 LAB — SEDIMENTATION RATE: Sed Rate: 42 mm/hr — ABNORMAL HIGH (ref 0–22)

## 2015-01-22 LAB — BASIC METABOLIC PANEL
Anion gap: 10 (ref 5–15)
BUN: 36 mg/dL — ABNORMAL HIGH (ref 6–20)
CALCIUM: 9.1 mg/dL (ref 8.9–10.3)
CO2: 26 mmol/L (ref 22–32)
CREATININE: 1.67 mg/dL — AB (ref 0.44–1.00)
Chloride: 102 mmol/L (ref 101–111)
GFR, EST AFRICAN AMERICAN: 34 mL/min — AB (ref >60)
GFR, EST NON AFRICAN AMERICAN: 30 mL/min — AB (ref >60)
Glucose, Bld: 153 mg/dL — ABNORMAL HIGH (ref 65–99)
POTASSIUM: 4 mmol/L (ref 3.5–5.1)
Sodium: 138 mmol/L (ref 135–145)

## 2015-01-22 LAB — CBC WITH DIFFERENTIAL/PLATELET
BASOS ABS: 0 10*3/uL (ref 0.0–0.1)
BASOS PCT: 1 % (ref 0–1)
EOS ABS: 0.1 10*3/uL (ref 0.0–0.7)
Eosinophils Relative: 3 % (ref 0–5)
HCT: 27.2 % — ABNORMAL LOW (ref 36.0–46.0)
Hemoglobin: 8.9 g/dL — ABNORMAL LOW (ref 12.0–15.0)
Lymphocytes Relative: 18 % (ref 12–46)
Lymphs Abs: 0.8 10*3/uL (ref 0.7–4.0)
MCH: 31.8 pg (ref 26.0–34.0)
MCHC: 32.7 g/dL (ref 30.0–36.0)
MCV: 97.1 fL (ref 78.0–100.0)
MONOS PCT: 8 % (ref 3–12)
Monocytes Absolute: 0.3 10*3/uL (ref 0.1–1.0)
Neutro Abs: 2.9 10*3/uL (ref 1.7–7.7)
Neutrophils Relative %: 70 % (ref 43–77)
PLATELETS: 190 10*3/uL (ref 150–400)
RBC: 2.8 MIL/uL — ABNORMAL LOW (ref 3.87–5.11)
RDW: 16.1 % — ABNORMAL HIGH (ref 11.5–15.5)
WBC: 4.1 10*3/uL (ref 4.0–10.5)

## 2015-01-22 MED ORDER — DARBEPOETIN ALFA 60 MCG/0.3ML IJ SOSY
60.0000 ug | PREFILLED_SYRINGE | Freq: Once | INTRAMUSCULAR | Status: AC
  Administered 2015-01-22: 60 ug via SUBCUTANEOUS

## 2015-01-22 MED ORDER — DARBEPOETIN ALFA 60 MCG/0.3ML IJ SOSY
PREFILLED_SYRINGE | INTRAMUSCULAR | Status: AC
  Filled 2015-01-22: qty 0.3

## 2015-01-22 NOTE — Progress Notes (Signed)
Patient ID: [name], female   DOB: 1945/08/27, 69 y.o.   MRN: [id number]  I provided surgical backup for pacemaker lead extraction from 08:15 to 10:13 am on 12/13/2014.

## 2015-01-22 NOTE — Telephone Encounter (Signed)
[name], RN called from [place] wanting to know if the patient's [facility] could be pulled on Wednesday because her antibiotics end tomorrow. Patient has an appointment tomorrow so she will be evaluated and a decision will be made.

## 2015-01-22 NOTE — Patient Instructions (Signed)
[place] at [facility] Discharge Instructions  RECOMMENDATIONS MADE BY THE CONSULTANT AND ANY TEST RESULTS WILL BE SENT TO YOUR REFERRING PHYSICIAN.  We do not need to repeat labs now if Home Health done them this morning. Have them send [place] a copy of the lab work. [facility] # [phone]) For future visits we will need the lab results before you come or we will do them when you arrive for your appointment. Return as scheduled August 15th for lab work and MD visit.  Thank you for choosing [place] at [facility] to provide your oncology and hematology care.  To afford each patient quality time with our provider, please arrive at least 15 minutes before your scheduled appointment time.    You need to re-schedule your appointment should you arrive 10 or more minutes late.  We strive to give you quality time with our providers, and arriving late affects you and other patients whose appointments are after yours.  Also, if you no show three or more times for appointments you may be dismissed from the clinic at the providers discretion.     Again, thank you for choosing [facility].  Our hope is that these requests will decrease the amount of time that you wait before being seen by our physicians.       _____________________________________________________________  Should you have questions after your visit to [facility], please contact our office at [phone] between the hours of 8:30 a.m. and 4:30 p.m.  Voicemails left after 4:30 p.m. will not be returned until the following business day.  For prescription refill requests, have your pharmacy contact our office.

## 2015-01-22 NOTE — Progress Notes (Signed)
LABS NOT DRAWN PER TRACY N

## 2015-01-22 NOTE — Progress Notes (Signed)
[name] presents today for injection per MD orders. Aranesp 60 mcg administered SQ in left Abdomen. Administration without incident. Patient tolerated well.

## 2015-01-23 ENCOUNTER — Ambulatory Visit (INDEPENDENT_AMBULATORY_CARE_PROVIDER_SITE_OTHER): Payer: Commercial Managed Care - HMO | Admitting: Internal Medicine

## 2015-01-23 ENCOUNTER — Other Ambulatory Visit: Payer: Self-pay | Admitting: *Deleted

## 2015-01-23 ENCOUNTER — Encounter: Payer: Self-pay | Admitting: Internal Medicine

## 2015-01-23 VITALS — BP 125/67 | HR 51 | Temp 97.7°F | Ht 65.0 in | Wt 153.0 lb

## 2015-01-23 DIAGNOSIS — T827XXD Infection and inflammatory reaction due to other cardiac and vascular devices, implants and grafts, subsequent encounter: Secondary | ICD-10-CM | POA: Diagnosis not present

## 2015-01-23 NOTE — Addendum Note (Signed)
Addended by: Laverle Patter on: 01/23/2015 10:52 AM   Modules accepted: Orders

## 2015-01-23 NOTE — Progress Notes (Signed)
Per Dr Linus Salmons 42 cm  Double lumen Peripherally Inserted Central Catheter  removed from right basilic . No sutures present. Dressing was clean and dry . Area cleansed with chlorhexidine and petroleum dressing applied. Pt advised no heavy lifting with this arm, leave dressing for 24 hours and call the office if dressing becomes soaked with blood or sharp pain presents.  Pt tolerated procedure well.    Laverle Patter, RN

## 2015-01-23 NOTE — Assessment & Plan Note (Signed)
Did well, completed treatment with no new issues.  Will stop antibiotics, have picc removed.

## 2015-01-23 NOTE — Patient Outreach (Signed)
TOC week #3 attempted. Call to patient home number, no answer, unable to leave a message. Plan to continue to attempt to reach later. Continue TOC program calls. [name]. Laymond Purser, RN, BSN, [place] [phone]

## 2015-01-23 NOTE — Progress Notes (Signed)
   Subjective:    Patient ID: [name], female    DOB: May 03, 1946, 69 y.o.   MRN: [id number]  HPI She comes in for hospital follow-up. She has a history of enterococcal ICD infection previously in 2007 and was hospitalized for fever and chills and blood cultures again were enterococcus positive. It was ampicillin sensitive and she was started on antibiotics with initially ampicillin and gentamicin however this was changed due to some renal insufficiency to ampicillin with ceftriaxone. She did have her ICD evaluated and noted a vegetation and so this was removed. The plan is to not replace ICD. She then was discharged on ceftriaxone and Unasyn (due to cost difference compared to ampicillin), and has now finished about 2 weeks of a projected 6 week course. She did have problems with her PICC line yesterday and had to be removed. Replaced and did well since.  No new issues.  Sees cardiology and nephrology.    Review of Systems  Constitutional: Positive for fatigue. Negative for fever and chills.  Gastrointestinal: Negative for nausea and diarrhea.  Skin: Negative for rash.  Neurological: Negative for dizziness and light-headedness.       Objective:   Physical Exam  Constitutional: She appears well-developed and well-nourished. No distress.  Eyes: No scleral icterus.  Cardiovascular: Normal rate, regular rhythm and normal heart sounds.   No murmur heard. Pulmonary/Chest: Effort normal and breath sounds normal. No respiratory distress.  Skin: No rash noted.          Assessment & Plan:

## 2015-01-24 ENCOUNTER — Other Ambulatory Visit (HOSPITAL_COMMUNITY): Payer: Self-pay | Admitting: Respiratory Therapy

## 2015-01-30 ENCOUNTER — Other Ambulatory Visit (HOSPITAL_COMMUNITY): Payer: Self-pay | Admitting: Cardiology

## 2015-01-31 ENCOUNTER — Other Ambulatory Visit: Payer: Self-pay | Admitting: *Deleted

## 2015-01-31 ENCOUNTER — Encounter: Payer: Self-pay | Admitting: *Deleted

## 2015-01-31 NOTE — Patient Outreach (Signed)
[place] [facility]) Care Management   01/31/2015  [name] 1946/01/03 [id number]  [name] is an 69 y.o. female  Subjective:  Patient reports she is doing well, feeling stronger everyday. Sleep study rescheduled for 02/15/15, they went to previous appointment  but states there was some type of mix up and had to be rescheduled.  Finished with antibiotics and PICC line is out of arm, site within normal limits. Patient states she feel better and some stronger since getting injections started. She is still getting home health services, she states she has increased her walking to 200 ft in the house is not able to go outside due to heat and humidity. Patient is using incentive spirometer for assistance with deep breathing, does not use oxygen. Spouse is keeping up with BP, pulse, Weights and CBG's in the blue [facility] calendar. Spouse verbalizes signs and symptoms to report to MD. He states they want patient to stay out of the hospital, he encouraged patient to let him know if she has any signs or symptoms so he can let the MD know. Patient weight stable at 147. Patient reports good appetite and that she is doing some light cooking, she has cut back on drinking ensure to about every other day.  Objective:  Filed Vitals:   01/31/15 1019  BP: 108/52  Pulse: 58  Resp: 20    Review of Systems  Constitutional: Negative.   HENT: Negative.   Eyes: Negative.   Respiratory: Negative.   Cardiovascular: Negative.   Gastrointestinal: Negative.   Genitourinary: Negative.   Musculoskeletal: Negative.   Skin: Negative.   Neurological: Negative.   Endo/Heme/Allergies: Negative.   Psychiatric/Behavioral: Negative.     Physical Exam  Constitutional: She is oriented to person, place, and time. She appears well-developed.  HENT:  Head: Normocephalic.  Neck: Normal range of motion.  Cardiovascular: Regular rhythm.   Heart Rate 58  Respiratory: Effort normal.  Coarse breath sounds   GI: Soft.  Neurological: She is alert and oriented to person, place, and time.  Skin: Skin is warm and dry.  Psychiatric: She has a normal mood and affect.    Current Medications:   Current Outpatient Prescriptions  Medication Sig Dispense Refill  . amiodarone (PACERONE) 200 MG tablet TAKE 1 TABLET BY MOUTH DAILY. 90 tablet 3  . carvedilol (COREG) 3.125 MG tablet TAKE 3 TABLETS BY MOUTH TWICE DAILY WITH A MEAL. 180 tablet 3  . colchicine 0.6 MG tablet Take 0.6 mg by mouth 2 (two) times daily as needed.     . digoxin (LANOXIN) 0.125 MG tablet Take 0.5 tablets (0.0625 mg total) by mouth every other day. 30 tablet 3  . feeding supplement, ENSURE ENLIVE, (ENSURE ENLIVE) LIQD Take 237 mLs by mouth 2 (two) times daily between meals. (Patient taking differently: Take 237 mLs by mouth daily. ) 237 mL 12  . ferrous sulfate 324 (65 FE) MG TBEC Take 1 tablet (325 mg total) by mouth 2 (two) times daily. 60 tablet   . furosemide (LASIX) 40 MG tablet Take 1 tablet (40 mg total) by mouth 2 (two) times daily. 90 tablet 3  . glimepiride (AMARYL) 2 MG tablet Take 2 mg by mouth daily.    . isosorbide mononitrate (IMDUR) 30 MG 24 hr tablet Take 1 tablet (30 mg total) by mouth daily. 30 tablet 0  . levothyroxine (SYNTHROID, LEVOTHROID) 25 MCG tablet Take 1 tablet (25 mcg total) by mouth daily before breakfast. 30 tablet 0  .  lisinopril (PRINIVIL,ZESTRIL) 5 MG tablet Take 5 mg by mouth daily.    . metoCLOPramide (REGLAN) 5 MG tablet Take 5 mg by mouth 2 (two) times daily.     . ondansetron (ZOFRAN) 4 MG tablet Take 4 mg by mouth every 4 (four) hours as needed for nausea or vomiting.    Marland Kitchen oxyCODONE-acetaminophen (PERCOCET) 10-325 MG per tablet Take 1 tablet by mouth every 6 (six) hours as needed for pain.     . polyethylene glycol (MIRALAX / GLYCOLAX) packet Take 17 g by mouth daily as needed for mild constipation.    . pravastatin (PRAVACHOL) 40 MG tablet Take 80 mg by mouth at bedtime.     Marland Kitchen spironolactone  (ALDACTONE) 25 MG tablet Take 0.5 tablets (12.5 mg total) by mouth daily. 45 tablet 3  . warfarin (COUMADIN) 2.5 MG tablet Take 0.5-1 tablets (1.25-2.5 mg total) by mouth daily at 6 PM. Takes 1 tablet on Mon, Wed, Fri, take 0.5 tablet on all other days     No current facility-administered medications for this visit.    Functional Status:   In your present state of health, do you have any difficulty performing the following activities: 01/08/2015 01/03/2015  Hearing? N N  Vision? N N  Difficulty concentrating or making decisions? N N  Walking or climbing stairs? N N  Dressing or bathing? Y N  Doing errands, shopping? [name]  Preparing Food and eating ? - Y  Using the Toilet? - N  In the past six months, have you accidently leaked urine? - Y  Do you have problems with loss of bowel control? - N  Managing your Medications? - Y  Managing your Finances? - N  Housekeeping or managing your Housekeeping? - Y    Fall/Depression Screening:    Fall Risk  01/23/2015 12/28/2014  Falls in the past year? Yes Yes  Number falls in past yr: 1 1  Injury with Fall? No No  Risk for fall due to : History of fall(s);Impaired balance/gait;Impaired mobility History of fall(s);Impaired mobility  Follow up Education provided Falls evaluation completed   PHQ 2/9 Scores 01/23/2015 12/28/2014  PHQ - 2 Score 0 0    Assessment:   Patient doing well, no new issues this visit Needs review and reinforcement of care plan goals and interventions.  Plan:  Revisit in September Tina E. Niemczura, RN, BSN, [place] [phone]  [facility] CM Care Plan Problem One        Patient Outreach from 01/31/2015 in [place] Problem One  Inpatient Hospitalzation for infective endocarditis   Care Plan for Problem One  Active   [facility] Long Term Goal (31-90 days)  Patient will not have hospitalization for infective endocarditis in the next 90 days   [facility] Long Term Goal Start Date  12/18/14    Interventions for Problem One Long Term Goal  Using teachback method reviewed appointments and medications reinforced signs and symptoms to report   [facility] CM Short Term Goal #1 (0-30 days)  Patient will not a rehospitalization for infective endocarditis in the next 30 days   [facility] CM Short Term Goal #1 Start Date  12/18/14   [facility] CM Short Term Goal #1 Met Date  01/31/15   Interventions for Short Term Goal #1  goal met, no inpatient hopsitalizations in past 30 days   [facility] CM Short Term Goal #2 (0-30 days)  Patient will take all of her prescribed antibiotics over  the next 6 weeks   [facility] CM Short Term Goal #2 Met Date  01/23/15   Interventions for Short Term Goal #2  completed IV antibiotics on 01/23/15    [facility] CM Care Plan Problem Two        Patient Outreach from 01/31/2015 in [place] Problem Two  Patient Blood pressure running low as evidenced by VS readings   Care Plan for Problem Two  Active   Interventions for Problem Two Long Term Goal   Using teachback reviewed BP log, discussed taking log to appointments   [facility] Long Term Goal (31-90) days  Patient BP will be within normal limits over the next 90 days   [facility] Long Term Goal Start Date  01/03/15   [facility] CM Short Term Goal #1 (0-30 days)  Patient spouse will monitor and record BP readings over the next 30 days   [facility] CM Short Term Goal #1 Start Date  01/03/15   Interventions for Short Term Goal #2   Using teachback method reviewed bp logs with patient and spouse, reinforced when to notify MD

## 2015-02-01 ENCOUNTER — Ambulatory Visit (HOSPITAL_COMMUNITY): Payer: Commercial Managed Care - HMO | Admitting: Hematology & Oncology

## 2015-02-02 ENCOUNTER — Other Ambulatory Visit (HOSPITAL_COMMUNITY): Payer: Self-pay | Admitting: Respiratory Therapy

## 2015-02-05 ENCOUNTER — Encounter (HOSPITAL_BASED_OUTPATIENT_CLINIC_OR_DEPARTMENT_OTHER): Payer: Commercial Managed Care - HMO | Admitting: Hematology & Oncology

## 2015-02-05 ENCOUNTER — Encounter (HOSPITAL_COMMUNITY): Payer: Self-pay | Admitting: Hematology & Oncology

## 2015-02-05 ENCOUNTER — Encounter (HOSPITAL_COMMUNITY): Payer: Commercial Managed Care - HMO | Attending: Hematology & Oncology

## 2015-02-05 ENCOUNTER — Encounter (HOSPITAL_BASED_OUTPATIENT_CLINIC_OR_DEPARTMENT_OTHER): Payer: Commercial Managed Care - HMO

## 2015-02-05 VITALS — BP 104/53 | HR 53 | Temp 98.2°F | Resp 16 | Wt 150.9 lb

## 2015-02-05 DIAGNOSIS — N189 Chronic kidney disease, unspecified: Secondary | ICD-10-CM

## 2015-02-05 DIAGNOSIS — I5043 Acute on chronic combined systolic (congestive) and diastolic (congestive) heart failure: Secondary | ICD-10-CM

## 2015-02-05 DIAGNOSIS — N184 Chronic kidney disease, stage 4 (severe): Secondary | ICD-10-CM

## 2015-02-05 DIAGNOSIS — D649 Anemia, unspecified: Secondary | ICD-10-CM

## 2015-02-05 DIAGNOSIS — D638 Anemia in other chronic diseases classified elsewhere: Secondary | ICD-10-CM

## 2015-02-05 DIAGNOSIS — D509 Iron deficiency anemia, unspecified: Secondary | ICD-10-CM

## 2015-02-05 DIAGNOSIS — D631 Anemia in chronic kidney disease: Secondary | ICD-10-CM | POA: Diagnosis not present

## 2015-02-05 DIAGNOSIS — Z7901 Long term (current) use of anticoagulants: Secondary | ICD-10-CM | POA: Diagnosis not present

## 2015-02-05 LAB — CBC
HEMATOCRIT: 30.9 % — AB (ref 36.0–46.0)
HEMOGLOBIN: 10 g/dL — AB (ref 12.0–15.0)
MCH: 32.2 pg (ref 26.0–34.0)
MCHC: 32.4 g/dL (ref 30.0–36.0)
MCV: 99.4 fL (ref 78.0–100.0)
Platelets: 192 10*3/uL (ref 150–400)
RBC: 3.11 MIL/uL — ABNORMAL LOW (ref 3.87–5.11)
RDW: 15.3 % (ref 11.5–15.5)
WBC: 5.2 10*3/uL (ref 4.0–10.5)

## 2015-02-05 MED ORDER — DARBEPOETIN ALFA 60 MCG/0.3ML IJ SOSY
60.0000 ug | PREFILLED_SYRINGE | Freq: Once | INTRAMUSCULAR | Status: AC
  Administered 2015-02-05: 60 ug via SUBCUTANEOUS

## 2015-02-05 MED ORDER — DARBEPOETIN ALFA 60 MCG/0.3ML IJ SOSY
PREFILLED_SYRINGE | INTRAMUSCULAR | Status: AC
  Filled 2015-02-05: qty 0.3

## 2015-02-05 NOTE — Progress Notes (Signed)
[name] presents today for injection per MD orders. Aranesp 60 mcg administered SQ in left Abdomen. Administration without incident. Patient tolerated well.

## 2015-02-05 NOTE — Patient Instructions (Signed)
[place] at [facility] Discharge Instructions  RECOMMENDATIONS MADE BY THE CONSULTANT AND ANY TEST RESULTS WILL BE SENT TO YOUR REFERRING PHYSICIAN.  Hemoglobin 10.0 today. Aranesp 60 mcg given today as ordered. Continue lab work and injection every 2 weeks. Return as scheduled.  Thank you for choosing [place] at [facility] to provide your oncology and hematology care.  To afford each patient quality time with our provider, please arrive at least 15 minutes before your scheduled appointment time.    You need to re-schedule your appointment should you arrive 10 or more minutes late.  We strive to give you quality time with our providers, and arriving late affects you and other patients whose appointments are after yours.  Also, if you no show three or more times for appointments you may be dismissed from the clinic at the providers discretion.     Again, thank you for choosing [facility].  Our hope is that these requests will decrease the amount of time that you wait before being seen by our physicians.       _____________________________________________________________  Should you have questions after your visit to [facility], please contact our office at [phone] between the hours of 8:30 a.m. and 4:30 p.m.  Voicemails left after 4:30 p.m. will not be returned until the following business day.  For prescription refill requests, have your pharmacy contact our office.

## 2015-02-05 NOTE — Progress Notes (Signed)
[facility] Hematology/Oncology Consultation   Name: [name]      MRN: [id number]     Date: 02/05/2015 Time:3:57 PM   REFERRING PHYSICIAN:  Dr. Mahala Menghini  REASON FOR CONSULT:  Anemia of chronic disease   DIAGNOSIS:  Anemia of chronic disease with an significant element of anemia of chronic renal disease.  HISTORY OF PRESENT ILLNESS:   [name] is a pleasant 69 year old white American woman with a past medical history significant for Type II DM with renal complications, LBBB, ICD, hypothyroidism, HTN, dyslipidemia, COPD, Chronic systolic and diastolic CHF, A-Fib, and chronic renal disease stage IV who is referred to [facility] for evaluation and management of anemia, normocytic, normochromic.  She has been seen in the hospital by Dr. Beryle Beams ([place]) in May 2016.  Chart reviewed.  Day has been in and out of the hospital quite a bit as of late. Her most recent admission was from enterococcus bacteremia with an involved subcutaneous ICD requiring removal. She is not having it replaced.  Her anemia work-up is complete including colonoscopy, EGD, and camera capsule study for a GI standpoint,  No source of bleeding was identified.  From a hematology standpoint, her multiple myeloma work-up is negative.  Bone marrow aspiration and biopsy is not indicated at this time as it would not change our treatment.  She was started on 40 mcg of Aranesp in the hospital on 10/20/2014 and another dose of 100 mcg on 12/01/2014, but none since.  She did receive Feraheme 510 mg on 4/29 and 12/01/2014 when her ferritin was in the teens.   The patient is present with her husband.  She is due for her 2nd Aranesp shot today.  She is using a walker. She questions how long it will be before her energy begins to improve.  Her Cardiologist is Dr. Algernon Huxley. She states that her appetite is fair.  She has no other complaints or concerns at this time.  PAST MEDICAL HISTORY:   Past Medical  History  Diagnosis Date  . Cardiomyopathy, nonischemic     a. 1999 nl cath;  b. 12/05 Guidant [place];  c. 10/2005 ICD extraction 2/2 enterococcus bacteremia and Veg on RV lead;  c. 05/2008 low risk Myoview (scarring w/ some evidence of inf ischemia);  d. 11/2012 Echo: EF 15-20%;  e. 01/2013 s/p MDT [place] CRT D, ser # [id number];  f. 05/2013 Echo: EF 15%.  . Enterococcal infection     a. 10/2005 - AICD-explanted  . Pulmonary embolism     a. 07/2005 after total right hip arthroplasty  . Hilar density     a. infrahilar mass/adenopathy on CT scan 5/07; subsequently  resolved  . LBBB (left bundle branch block)   . GERD (gastroesophageal reflux disease)   . Hyperlipidemia   . Hypertension   . Tobacco abuse     a. discontinued in 1997, and then resumed  . Urinary incontinence   . Anemia     a. mild/chronic  . Villous adenoma of colon     a. tubovillous adenomatous polyp with focal high grade dysplasia; presented with hematochezia - followed by Dr. Laural Golden.  . Implantable cardioverter-defibrillator-CRT- Mdt     a.  01/2013 s/p MDT [place] CRT D, ser # [id number]  . Chronic systolic CHF (congestive heart failure)     a. 11/2012 Echo: EF 15-20%;  b. 05/2013 TEE EF 15%.  . Pneumonia 07/2005  . Obstructive sleep apnea  a. mild-did not tolerate CPAP (01/24/2013)  . History of blood transfusion   . Degenerative joint disease     of knees, shoulder, and hips  . PAF (paroxysmal atrial fibrillation)     a. 07/2012 s/p TEE/DCCV;  b. chronic coumadin;  c. 05/2013 Recurrent Afib->TEE/DCCV and amio initiation.  . CKD (chronic kidney disease), stage III     creatinin-1.44 in 1/09; 1.51 in 1/10  . Type II diabetes mellitus     type 2    ALLERGIES: No Known Allergies    MEDICATIONS: I have reviewed the patient's current medications.    Current Outpatient Prescriptions on File Prior to Visit  Medication Sig Dispense Refill  . amiodarone (PACERONE) 200 MG tablet TAKE 1 TABLET BY MOUTH DAILY. 90 tablet 3   . carvedilol (COREG) 3.125 MG tablet TAKE 3 TABLETS BY MOUTH TWICE DAILY WITH A MEAL. 180 tablet 3  . colchicine 0.6 MG tablet Take 0.6 mg by mouth 2 (two) times daily as needed.     . digoxin (LANOXIN) 0.125 MG tablet Take 0.5 tablets (0.0625 mg total) by mouth every other day. 30 tablet 3  . feeding supplement, ENSURE ENLIVE, (ENSURE ENLIVE) LIQD Take 237 mLs by mouth 2 (two) times daily between meals. (Patient taking differently: Take 237 mLs by mouth daily. ) 237 mL 12  . ferrous sulfate 324 (65 FE) MG TBEC Take 1 tablet (325 mg total) by mouth 2 (two) times daily. 60 tablet   . furosemide (LASIX) 40 MG tablet Take 1 tablet (40 mg total) by mouth 2 (two) times daily. 90 tablet 3  . glimepiride (AMARYL) 2 MG tablet Take 2 mg by mouth daily.    . isosorbide mononitrate (IMDUR) 30 MG 24 hr tablet Take 1 tablet (30 mg total) by mouth daily. 30 tablet 0  . levothyroxine (SYNTHROID, LEVOTHROID) 25 MCG tablet Take 1 tablet (25 mcg total) by mouth daily before breakfast. 30 tablet 0  . lisinopril (PRINIVIL,ZESTRIL) 5 MG tablet Take 5 mg by mouth daily.    . metoCLOPramide (REGLAN) 5 MG tablet Take 5 mg by mouth 2 (two) times daily.     . ondansetron (ZOFRAN) 4 MG tablet Take 4 mg by mouth every 4 (four) hours as needed for nausea or vomiting.    Marland Kitchen oxyCODONE-acetaminophen (PERCOCET) 10-325 MG per tablet Take 1 tablet by mouth every 6 (six) hours as needed for pain.     . polyethylene glycol (MIRALAX / GLYCOLAX) packet Take 17 g by mouth daily as needed for mild constipation.    . pravastatin (PRAVACHOL) 40 MG tablet Take 80 mg by mouth at bedtime.     Marland Kitchen spironolactone (ALDACTONE) 25 MG tablet Take 0.5 tablets (12.5 mg total) by mouth daily. 45 tablet 3  . warfarin (COUMADIN) 2.5 MG tablet Take 0.5-1 tablets (1.25-2.5 mg total) by mouth daily at 6 PM. Takes 1 tablet on Mon, Wed, Fri, take 0.5 tablet on all other days     No current facility-administered medications on file prior to visit.     PAST  SURGICAL HISTORY Past Surgical History  Procedure Laterality Date  . Pacemaker removal  11/18/05    Enterococcal infection  . Total hip arthroplasty Right 07/2005  . Knee arthroscopy Right 1980's?  . Colonoscopy w/ polypectomy  2009  . Cardioversion N/A 08/20/2012    Procedure: TEE GUIDED CARDIOVERSION;  Surgeon: Yehuda Savannah, MD;  Location: [facility];  Service: Cardiovascular;  Laterality: N/A;  To be done @ bedside  .  Tee without cardioversion N/A 08/20/2012    Procedure: TRANSESOPHAGEAL ECHOCARDIOGRAM (TEE);  Surgeon: Yehuda Savannah, MD;  Location: [facility];  Service: Cardiovascular;  Laterality: N/A;  . Bi-ventricular implantable cardioverter defibrillator  (crt-d)  01/24/2013  . A-v cardiac pacemaker insertion  12/05    Biventricular pacemaker/AICD  . Abdominal hysterectomy  1990/92    Initial partial hysterectomy followed by BSO  . Tubal ligation  1980's  . Cardiac catheterization    . Colonoscopy with esophagogastroduodenoscopy (egd) N/A 06/10/2013    Procedure: COLONOSCOPY WITH ESOPHAGOGASTRODUODENOSCOPY (EGD);  Surgeon: Rogene Houston, MD;  Location: [facility];  Service: Endoscopy;  Laterality: N/A;  925  . Tee without cardioversion N/A 06/20/2013    Procedure: TRANSESOPHAGEAL ECHOCARDIOGRAM (TEE);  Surgeon: Dorothy Spark, MD;  Location: [place];  Service: Cardiovascular;  Laterality: N/A;  . Cardioversion N/A 06/20/2013    Procedure: CARDIOVERSION;  Surgeon: Dorothy Spark, MD;  Location: [facility] ENDOSCOPY;  Service: Cardiovascular;  Laterality: N/A;  . Bi-ventricular implantable cardioverter defibrillator N/A 01/24/2013    Procedure: BI-VENTRICULAR IMPLANTABLE CARDIOVERTER DEFIBRILLATOR  (CRT-D);  Surgeon: Evans Lance, MD;  Location: [facility] CATH LAB;  Service: Cardiovascular;  Laterality: N/A;  . Right heart catheterization N/A 04/18/2014    Procedure: RIGHT HEART CATH;  Surgeon: Larey Dresser, MD;  Location: [facility] CATH LAB;  Service: Cardiovascular;  Laterality: N/A;  .  Esophagogastroduodenoscopy N/A 10/21/2014    Procedure: ESOPHAGOGASTRODUODENOSCOPY (EGD);  Surgeon: Inda Castle, MD;  Location: [place];  Service: Endoscopy;  Laterality: N/A;  . Colonoscopy Left 10/24/2014    Procedure: COLONOSCOPY;  Surgeon: Carol Ada, MD;  Location: [facility] ENDOSCOPY;  Service: Endoscopy;  Laterality: Left;  Freda Munro capsule study N/A 10/24/2014    Procedure: GIVENS CAPSULE STUDY;  Surgeon: Carol Ada, MD;  Location: [facility] ENDOSCOPY;  Service: Endoscopy;  Laterality: N/A;  . Tee without cardioversion N/A 12/08/2014    Procedure: TRANSESOPHAGEAL ECHOCARDIOGRAM (TEE);  Surgeon: Fay Records, MD;  [facility]: [facility];  Service: Cardiovascular;  Laterality: N/A;  . Icd lead removal N/A 12/13/2014    Procedure: ICD LEAD REMOVAL/EXTRACTION ;  Surgeon: Evans Lance, MD;  Location: [place];  Service: Cardiovascular;  Laterality: N/A;  Bartle back up    FAMILY HISTORY: Family History  Problem Relation Age of Onset  . Hypertension Mother   . Diabetes Mother   . Coronary artery disease Father   . Diabetes Brother   . Hypertension Brother   . Lung cancer Brother   . Arthritis Other   . Diabetes Other   . Heart disease Other     female < 78   She has 6 children (ages 75- 79) with 27 grandchildren, and 10 great-grandchildren.  She has been married x 51 years.  SOCIAL HISTORY:  reports that she has quit smoking. Her smoking use included Cigarettes. She quit after 12 years of use. She has never used smokeless tobacco. She reports that she does not drink alcohol or use illicit drugs.  She denies any EtOH, tobacco, and illicit drug abuse.  She used to smoke but "did not inhale."  She used to work as a Secretary/administrator at [employer].  She retired with disability from her heart condition(s).  14 point review of systems was performed and is negative except as detailed under history of present illness and above   PERFORMANCE STATUS: The patient's performance status is 1 -  Symptomatic but completely ambulatory patient uses a walker  PHYSICAL EXAM: Most Recent  Vital Signs: Blood pressure 104/53, pulse 53, temperature 98.2 F (36.8 C), temperature source Oral, resp. rate 16, weight 150 lb 14.4 oz (68.448 kg), SpO2 98 %. General appearance: alert, cooperative, no distress and accompanied by husband Walks to exam table using her walker Head: Normocephalic, without obvious abnormality, atraumatic Neck: no adenopathy and supple, symmetrical, trachea midline Lungs: clear to auscultation bilaterally and normal percussion bilaterally Heart: irregularly irregular rhythm and gallop noted and systolic ejection murmur Abdomen: soft/NT good BS throughout Extremities: edema right lower extremity is positive for edema, 1+ pitting, without erythema, heat, or pain Skin: Skin color, texture, turgor normal. No rashes or lesions Neurologic: Grossly normal  LABORATORY DATA:  CBC    Component Value Date/Time   WBC 5.2 02/05/2015 1105   RBC 3.11* 02/05/2015 1105   RBC 2.52* 10/18/2014 0850   HGB 10.0* 02/05/2015 1105   HCT 30.9* 02/05/2015 1105   PLT 192 02/05/2015 1105   MCV 99.4 02/05/2015 1105   MCH 32.2 02/05/2015 1105   MCHC 32.4 02/05/2015 1105   RDW 15.3 02/05/2015 1105   LYMPHSABS 0.8 01/22/2015 0910   MONOABS 0.3 01/22/2015 0910   EOSABS 0.1 01/22/2015 0910   BASOSABS 0.0 01/22/2015 0910      Chemistry      Component Value Date/Time   NA 138 01/22/2015 0910   K 4.0 01/22/2015 0910   CL 102 01/22/2015 0910   CO2 26 01/22/2015 0910   BUN 36* 01/22/2015 0910   CREATININE 1.67* 01/22/2015 0910   CREATININE 1.17* 01/20/2013 1010      Component Value Date/Time   CALCIUM 9.1 01/22/2015 0910   ALKPHOS 71 01/08/2015 0704   AST 10* 01/08/2015 0704   ALT 10* 01/08/2015 0704   BILITOT 0.8 01/08/2015 0704       PATHOLOGY:  None  ASSESSMENT:  1. Anemia of chronic disease with a definite element of anemia of chronic renal disease.  S/P 5-6 units of blood x  1 year. 2. Element of iron deficiency anemia, S/P IV Feraheme 510 mg on 4/29 and 12/01/2014.    Patient Active Problem List   Diagnosis Date Noted  . Acute respiratory failure with hypoxia 01/08/2015  . Paroxysmal atrial fibrillation 01/08/2015  . Diabetes mellitus type 2 with complications 35/00/9381  . Healthcare-associated pneumonia 01/07/2015  . HCAP (healthcare-associated pneumonia) 01/07/2015  . ICD (implantable cardioverter-defibrillator) infection   . Enterococcal bacteremia   . Diabetic feet   . Bacteremia 12/07/2014  . SOB (shortness of breath)   . Acute on chronic combined systolic and diastolic CHF (congestive heart failure) 12/01/2014  . COPD (chronic obstructive pulmonary disease) 11/30/2014  . Anemia of chronic disease 11/30/2014  . Hypothyroidism 11/30/2014  . Fever 11/30/2014  . Symptomatic anemia 10/17/2014  . Implantable cardioverter-defibrillator-CRT- Mdt   . Atrial fibrillation 06/19/2013  . Chronic anticoagulation 08/19/2012  . Chronic combined systolic and diastolic CHF (congestive heart failure) 08/03/2012  . History of pulmonary embolism   . Type 2 diabetes, uncontrolled, with renal manifestation   . Tobacco abuse, in remission   . CKD (chronic kidney disease), stage IV   . LBBB 01/29/2009  . Dyslipidemia 06/04/2007  . HTN (hypertension) 06/04/2007     PLAN:   I would continue with aranesp as ordered. Pending her next CBC we may need to decrease dosing depending upon the rise of her H/H. I advised the patient that she may still intermittently require IV iron replacement. I explained that this is the preferred method of iron replacement while  on aranesp. I will see her back in one month. If at that point she is doing well we can move her visits out. She will continue on aranesp every 2 weeks.  All questions were answered. The patient knows to call the clinic with any problems, questions or concerns. We can certainly see the patient much sooner if  necessary.   This document serves as a record of services personally performed by Ancil Linsey, MD. It was created on her behalf by Janace Hoard, a trained medical scribe. The creation of this record is based on the scribe's personal observations and the provider's statements to them. This document has been checked and approved by the attending provider.  I have reviewed the above documentation for accuracy and completeness, and I agree with the above.  This note was electronically signed.  Kelby Fam. Whitney Muse, MD

## 2015-02-06 ENCOUNTER — Ambulatory Visit (HOSPITAL_COMMUNITY)
Admission: RE | Admit: 2015-02-06 | Discharge: 2015-02-06 | Disposition: A | Discharge status: Home / Self Care | Payer: Commercial Managed Care - HMO | Source: Ambulatory Visit | Attending: Internal Medicine | Admitting: Internal Medicine

## 2015-02-06 VITALS — BP 132/64 | HR 50 | Wt 152.8 lb

## 2015-02-06 DIAGNOSIS — I129 Hypertensive chronic kidney disease with stage 1 through stage 4 chronic kidney disease, or unspecified chronic kidney disease: Secondary | ICD-10-CM | POA: Insufficient documentation

## 2015-02-06 DIAGNOSIS — E785 Hyperlipidemia, unspecified: Secondary | ICD-10-CM | POA: Insufficient documentation

## 2015-02-06 DIAGNOSIS — I429 Cardiomyopathy, unspecified: Secondary | ICD-10-CM | POA: Insufficient documentation

## 2015-02-06 DIAGNOSIS — G4733 Obstructive sleep apnea (adult) (pediatric): Secondary | ICD-10-CM | POA: Insufficient documentation

## 2015-02-06 DIAGNOSIS — I5022 Chronic systolic (congestive) heart failure: Secondary | ICD-10-CM | POA: Diagnosis not present

## 2015-02-06 DIAGNOSIS — Z7901 Long term (current) use of anticoagulants: Secondary | ICD-10-CM | POA: Insufficient documentation

## 2015-02-06 DIAGNOSIS — Z87891 Personal history of nicotine dependence: Secondary | ICD-10-CM | POA: Diagnosis not present

## 2015-02-06 DIAGNOSIS — K219 Gastro-esophageal reflux disease without esophagitis: Secondary | ICD-10-CM | POA: Diagnosis not present

## 2015-02-06 DIAGNOSIS — I5042 Chronic combined systolic (congestive) and diastolic (congestive) heart failure: Secondary | ICD-10-CM

## 2015-02-06 DIAGNOSIS — N189 Chronic kidney disease, unspecified: Secondary | ICD-10-CM | POA: Diagnosis not present

## 2015-02-06 DIAGNOSIS — Z79899 Other long term (current) drug therapy: Secondary | ICD-10-CM | POA: Insufficient documentation

## 2015-02-06 DIAGNOSIS — I480 Paroxysmal atrial fibrillation: Secondary | ICD-10-CM | POA: Diagnosis not present

## 2015-02-06 LAB — COMPREHENSIVE METABOLIC PANEL
ALK PHOS: 66 U/L (ref 38–126)
ALT: 14 U/L (ref 14–54)
AST: 18 U/L (ref 15–41)
Albumin: 3.8 g/dL (ref 3.5–5.0)
Anion gap: 8 (ref 5–15)
BILIRUBIN TOTAL: 0.9 mg/dL (ref 0.3–1.2)
BUN: 53 mg/dL — ABNORMAL HIGH (ref 6–20)
CALCIUM: 9.6 mg/dL (ref 8.9–10.3)
CO2: 28 mmol/L (ref 22–32)
CREATININE: 2.26 mg/dL — AB (ref 0.44–1.00)
Chloride: 105 mmol/L (ref 101–111)
GFR, EST AFRICAN AMERICAN: 24 mL/min — AB (ref >60)
GFR, EST NON AFRICAN AMERICAN: 21 mL/min — AB (ref >60)
Glucose, Bld: 151 mg/dL — ABNORMAL HIGH (ref 65–99)
Potassium: 4.8 mmol/L (ref 3.5–5.1)
Sodium: 141 mmol/L (ref 135–145)
Total Protein: 7.1 g/dL (ref 6.5–8.1)

## 2015-02-06 LAB — T4, FREE: FREE T4: 0.8 ng/dL (ref 0.61–1.12)

## 2015-02-06 LAB — DIGOXIN LEVEL: DIGOXIN LVL: 0.7 ng/mL — AB (ref 0.8–2.0)

## 2015-02-06 LAB — TSH: TSH: 14.487 u[IU]/mL — ABNORMAL HIGH (ref 0.350–4.500)

## 2015-02-06 MED ORDER — LISINOPRIL 10 MG PO TABS: 10.0000 mg | ORAL_TABLET | Freq: Every day | ORAL | Status: DC

## 2015-02-06 NOTE — Patient Instructions (Signed)
Increase Lisinopril to 10 mg daily, kyou can take 2 of your 5 mg tablets until you run out then we have sent in a prescription for 10 mg tablets to [facility] in 1 week  Your physician recommends that you schedule a follow-up appointment in: 2 months

## 2015-02-06 NOTE — Progress Notes (Signed)
Patient ID: [name], female   DOB: [birth year], 69 y.o.   MRN: [id number] PCP: Dr. Karie Kirks Cardiology: Dr. Lovena Le  69 yo with history of chronic systolic CHF from nonischemic cardiomyopathy, prior PE, CKD, and paroxysmal atrial fibrillation presents for CHF clinic followup.  She has a long history of cardiomyopathy, dating back to 1999.  At that time, she had coronary angiography showing no significant disease. She was initially followed by Dr Lattie Haw, later by Dr. Lovena Le. She had her initial CRT-D device in 2005.  This was removed in 2007 due to enterococcal bacteremia with vegetation.  She had Medtronic CRT- D device placed in 8/14.  She has had trouble with paroxysmal atrial fibrillation, and she was put on amiodarone and cardioverted in 12/14.  TEE in 12/14 showed EF 15% but RV appeared normal.   Had RHC on 04/18/14 for possible low output. This showed volume depletion with normal cardiac output. Lasix held for a few days then cut back from 80 bid to 40 bid. Lisinopril also held but has been restarted.   Admitted with anemia 6/9 through 12/02/2014. Received 1UPRBCs. Diuresed with IV lasix. She had follow up with nephrology for Epogen injections. Discharge weight was 152 pounds.   She was admitted later in 6/16, this time with enterococcal bacteremia and pacemaker lead vegetation by TEE.  Her CRT-D device was again extracted.  Her device remains out and she is still getting antibiotics via PICC.   She was admitted to [facility] in 7/16 with PNA and treated with abx.  She returns for post hospital follow up. She says that she feels pretty good. She is generally not very active and uses a walker around the house.  No dyspnea walking in the house. Says she can do anything she wants as long as she takes her time. No edema.  Weight stable at home 147-150. Started on aranesp shots at [facility]    Labs (7/15): K 3.5, creatinine 1.78, hemoglobin 9.4,m BNP 24607 Labs (10/15): K 4.7, creatinine 2.3,  digoxin 0.7, BNP 8376, HCT 30.9 Labs (04/18/14): K 4.3 creatinine 2.8  Labs (11/15): K 5.3, creatinine 1.9 Labs (10/26/2014) K 3.7 Creatinine 1.94 Hgb 9.3, LFTs normal Labs (11/2014): K 3.6 Creatinine 1.80=>1.65, digoxin 0.5 Labs (7/16): hgb 9 Labs (01/22/15): K 4.0 creatinine 1.67  ECG: NSR, LBBB   PMH:  1. Chronic systolic CHF: Nonischemic cardiomyopathy.  1999 had LHC with normal coronaries.  12/05 had Guidant BiV ICD placed. 5/07 had enterococcal bacteremia with ICD lead vegetation, device extracted.  8/14 had placement of Medtronic BiV ICD device.  Cardiolite (12/09) with multiple wall segments showing scarring, some inferior ischemia.  Echo (6/14) with EF 15-20%, diffuse hypokinesis with some regionality, normal RV size and systolic function.  TEE (12/14) with EF 15%, normal RV size and systolic function. RHC (10/15) with mean RA 1, PA 27/8, mean PCWP 5, CI 2.71. Enterococcal bacteremia 6/16 with CRT-D extraction.  Echo (4/16) with EF 15-20%, moderate LVH, diffuse hypokinesis, mild-moderate AI, mild MR, PASP 33 mmHg.  2. PE: 2/07 after right THR.  3. Right THR 4. LBBB 5. GERD 6. Hyperlipidemia 7. HTN 8. Anemia: 12/14 had EGD that was normal, colonoscopy with 3 polyps removed.  9. CKD 10. OSA: Mild, did not tolerate OSA 11. Atrial fibrillation: Paroxysmal.  TEE-guided DCCV in 2/14, TEE-guided DCCV in 12/14 on amiodarone.  12. Enterococcal bacteremia with CRT-D device removal in 5/07 then again in 6/16.    SH: Married, lives in [place], previous smoker.  FH: Mother with MI.   ROS: All systems reviewed and negative except as per HPI.   Current Outpatient Prescriptions  Medication Sig Dispense Refill  . amiodarone (PACERONE) 200 MG tablet TAKE 1 TABLET BY MOUTH DAILY. 90 tablet 3  . carvedilol (COREG) 3.125 MG tablet TAKE 3 TABLETS BY MOUTH TWICE DAILY WITH A MEAL. 180 tablet 3  . colchicine 0.6 MG tablet Take 0.6 mg by mouth 2 (two) times daily as needed.     . digoxin (LANOXIN)  0.125 MG tablet Take 0.5 tablets (0.0625 mg total) by mouth every other day. 30 tablet 3  . feeding supplement, ENSURE ENLIVE, (ENSURE ENLIVE) LIQD Take 237 mLs by mouth 2 (two) times daily between meals. (Patient taking differently: Take 237 mLs by mouth daily. ) 237 mL 12  . ferrous sulfate 324 (65 FE) MG TBEC Take 1 tablet (325 mg total) by mouth 2 (two) times daily. 60 tablet   . furosemide (LASIX) 40 MG tablet Take 1 tablet (40 mg total) by mouth 2 (two) times daily. 90 tablet 3  . glimepiride (AMARYL) 2 MG tablet Take 2 mg by mouth daily.    . isosorbide mononitrate (IMDUR) 30 MG 24 hr tablet Take 1 tablet (30 mg total) by mouth daily. 30 tablet 0  . levothyroxine (SYNTHROID, LEVOTHROID) 25 MCG tablet Take 1 tablet (25 mcg total) by mouth daily before breakfast. 30 tablet 0  . lisinopril (PRINIVIL,ZESTRIL) 5 MG tablet Take 5 mg by mouth daily.    . metoCLOPramide (REGLAN) 5 MG tablet Take 5 mg by mouth 2 (two) times daily.     . ondansetron (ZOFRAN) 4 MG tablet Take 4 mg by mouth every 4 (four) hours as needed for nausea or vomiting.    Marland Kitchen oxyCODONE-acetaminophen (PERCOCET) 10-325 MG per tablet Take 1 tablet by mouth every 6 (six) hours as needed for pain.     . polyethylene glycol (MIRALAX / GLYCOLAX) packet Take 17 g by mouth daily as needed for mild constipation.    . pravastatin (PRAVACHOL) 40 MG tablet Take 80 mg by mouth at bedtime.     Marland Kitchen spironolactone (ALDACTONE) 25 MG tablet Take 0.5 tablets (12.5 mg total) by mouth daily. 45 tablet 3  . warfarin (COUMADIN) 2.5 MG tablet Take 0.5-1 tablets (1.25-2.5 mg total) by mouth daily at 6 PM. Takes 1 tablet on Mon, Wed, Fri, take 0.5 tablet on all other days     No current facility-administered medications for this encounter.    BP 132/64 mmHg  Pulse 50  Wt 152 lb 12.8 oz (69.31 kg)  SpO2 98% General: NAD. Husband present.  Neck: JVP 6 cm, no thyromegaly or thyroid nodule.  Lungs: Clear to auscultation bilaterally with normal respiratory  effort. CV: Nondisplaced PMI.  Heart regular S1/S2, no S3/S4, 1/6 early SEM RUSB.  No ankle edema.  No carotid bruit.  Normal pedal pulses.  Abdomen: Soft, nontender, no hepatosplenomegaly, no distention.  Skin: Intact without lesions or rashes.  Neurologic: Alert and oriented x 3.  Psych: Normal affect. Extremities: No clubbing or cyanosis.   HEENT: Normal.   Assessment/Plan: 1. Chronic systolic CHF: Patient has a nonischemic cardiomyopathy. Echo EF 15-20% on 5/16 echo.  RHC (10/15) showed low filling pressures and normal cardiac output.She is unable to do CPX testing and probably would not be a good LVAD candidate.  NYHA II-III symptoms but not very active. Volume status stable. She had removal of CRT-D system for the 2nd time due to endocarditis in 6/16,  symptoms seem about the same.  - Continue current dose of lasix.  - Continue current Coreg. HR too low to titrate. Increase to 10mg  daily. If tolerates consider switch to low-dose Entresto at next visit.  - Continue digoxin level today.  - Continue spiro. - Would probably not replace CRT-D system at this point given 2 episodes of endocarditis now with device explantation.  2. Atrial fibrillation: Paroxysmal, in NSR.   - Continue amiodarone for now. May be able to decrease to 100mg  daily soon.  Check LFTs and TFTs today, should have regular eye exams.  Hypothyroid getting Levoxyl.  - She has been on coumadin with history of anemia. No definite GI bleeding source noted.Goal 2-2.5. Followed by PCP.  3. CKD: Follow creatinine closely. Last labs were stable 4. Anemia: She had EGD and c-scope in 12/14. In 12/15, she had transfusion but apparently no-showed for her GI workup. Seen by hematology earlier this year, suspect normocytic anemia is most likely due to combination of CKD and hypothyroidism, less likely MDS. Erythropoeitin started. She has had 5 units PRBCs in 5/16 and 1 unit in 6/16. Per GI: Iron deficiency anemia with blood in  stool, few scattered lymphangiectasias noted on the small bowel capsule study but no source of blood loss identified. Small lesions could be missed as she had some debris in the small bowel. Continue aranesp injections every 3 weeks.  If she has recurrent bleed can consider stopping coumadin.  5. OSA: Significant daytime sleepiness.Still pending sleep study.   Followup in 1 month.    Glori Bickers MD 02/06/2015

## 2015-02-06 NOTE — Progress Notes (Signed)
Labs drawn

## 2015-02-06 NOTE — Addendum Note (Signed)
Encounter addended by: Scarlette Calico, RN on: 02/06/2015 10:56 AM<BR>     Documentation filed: Dx Association, Patient Instructions Section, Orders

## 2015-02-07 LAB — T3: T3, Total: 78 ng/dL (ref 71–180)

## 2015-02-09 ENCOUNTER — Other Ambulatory Visit (HOSPITAL_COMMUNITY): Payer: Self-pay | Admitting: Respiratory Therapy

## 2015-02-13 LAB — T3, FREE: T3, Free: 2.5 pg/mL (ref 2.0–4.4)

## 2015-02-13 NOTE — Addendum Note (Signed)
Encounter addended by: Scarlette Calico, RN on: 02/13/2015  5:00 PM<BR>     Documentation filed: Clinical Notes

## 2015-02-19 ENCOUNTER — Other Ambulatory Visit: Payer: Self-pay | Admitting: Internal Medicine

## 2015-02-20 ENCOUNTER — Encounter (HOSPITAL_BASED_OUTPATIENT_CLINIC_OR_DEPARTMENT_OTHER): Payer: Commercial Managed Care - HMO

## 2015-02-20 VITALS — BP 94/82 | HR 54 | Temp 98.0°F | Resp 16

## 2015-02-20 DIAGNOSIS — N184 Chronic kidney disease, stage 4 (severe): Secondary | ICD-10-CM | POA: Diagnosis not present

## 2015-02-20 DIAGNOSIS — D631 Anemia in chronic kidney disease: Secondary | ICD-10-CM

## 2015-02-20 DIAGNOSIS — D638 Anemia in other chronic diseases classified elsewhere: Secondary | ICD-10-CM

## 2015-02-20 DIAGNOSIS — N189 Chronic kidney disease, unspecified: Secondary | ICD-10-CM | POA: Diagnosis not present

## 2015-02-20 DIAGNOSIS — D649 Anemia, unspecified: Secondary | ICD-10-CM

## 2015-02-20 LAB — CBC
HEMATOCRIT: 32.3 % — AB (ref 36.0–46.0)
HEMOGLOBIN: 10.6 g/dL — AB (ref 12.0–15.0)
MCH: 32.2 pg (ref 26.0–34.0)
MCHC: 32.8 g/dL (ref 30.0–36.0)
MCV: 98.2 fL (ref 78.0–100.0)
Platelets: 175 10*3/uL (ref 150–400)
RBC: 3.29 MIL/uL — ABNORMAL LOW (ref 3.87–5.11)
RDW: 14.1 % (ref 11.5–15.5)
WBC: 5.6 10*3/uL (ref 4.0–10.5)

## 2015-02-20 MED ORDER — DARBEPOETIN ALFA 60 MCG/0.3ML IJ SOSY
60.0000 ug | PREFILLED_SYRINGE | Freq: Once | INTRAMUSCULAR | Status: AC
  Administered 2015-02-20: 60 ug via SUBCUTANEOUS

## 2015-02-20 MED ORDER — DARBEPOETIN ALFA 60 MCG/0.3ML IJ SOSY
PREFILLED_SYRINGE | INTRAMUSCULAR | Status: AC
  Filled 2015-02-20: qty 0.3

## 2015-02-20 NOTE — Progress Notes (Signed)
LABS DRAWN

## 2015-02-20 NOTE — Progress Notes (Signed)
[name] presents today for injection per MD orders. Aranesp 60mcg administered SQ in right Abdomen. Administration without incident. Patient tolerated well.  

## 2015-02-21 ENCOUNTER — Other Ambulatory Visit: Payer: Self-pay | Admitting: *Deleted

## 2015-02-21 NOTE — Patient Outreach (Signed)
Call to patient to reschedule appointment due to provider conflict. Patient reporting she is doing well. She reports no new issues. Appointment rescheduled for 03/02/15 am Plan for outreach visit next week. [name]. Laymond Purser, RN, BSN, [place] [phone]

## 2015-02-23 ENCOUNTER — Ambulatory Visit (HOSPITAL_COMMUNITY)
Admission: RE | Admit: 2015-02-23 | Discharge: 2015-02-23 | Disposition: A | Discharge status: Home / Self Care | Payer: Commercial Managed Care - HMO | Source: Ambulatory Visit | Attending: Family Medicine | Admitting: Family Medicine

## 2015-02-23 ENCOUNTER — Other Ambulatory Visit (HOSPITAL_COMMUNITY): Payer: Self-pay | Admitting: Family Medicine

## 2015-02-23 DIAGNOSIS — J690 Pneumonitis due to inhalation of food and vomit: Secondary | ICD-10-CM | POA: Diagnosis not present

## 2015-02-23 DIAGNOSIS — R079 Chest pain, unspecified: Secondary | ICD-10-CM | POA: Insufficient documentation

## 2015-03-01 ENCOUNTER — Ambulatory Visit: Payer: Commercial Managed Care - HMO | Admitting: *Deleted

## 2015-03-02 ENCOUNTER — Other Ambulatory Visit: Payer: Commercial Managed Care - HMO | Admitting: *Deleted

## 2015-03-02 NOTE — Patient Outreach (Signed)
[place] [facility]) Care Management   03/02/2015  [name] 04/07/1946 [id number]  NIMSI MALES is an 69 y.o. female  Subjective:  Patient states she is doing fairly well today, she was able to do some "piddling" Patient reports a fall last week, her knees and right great toe were bruised but states she is better.  Patient denies pain. Patient saw her Primary care doctor, he did not make any changes to medications and thought patient was "doing good" Patient scheduled for sleep study on Monday, patient states she does not know if she will want to wear the CPAP, states she has had the CPAP before but it was loud and she could not sleep good with it on. Patient reports appetite is ok, uses supplements at times Patient spouse assists her and patient reports daughter moving close by and she is happy to have her near by for additional support.   Objective:   BP 118/52 mmHg  Pulse 55  Resp 20  Wt 148 lb (67.132 kg)  SpO2 98% Review of Systems  Constitutional: Negative.   HENT: Negative.   Eyes: Negative.   Respiratory: Negative.   Cardiovascular: Negative.   Gastrointestinal: Negative.   Genitourinary: Negative.   Musculoskeletal: Negative.   Skin: Negative.   Neurological: Negative.   Endo/Heme/Allergies: Negative.   Psychiatric/Behavioral: Negative.     Physical Exam  Constitutional: She is oriented to person, place, and time. She appears well-developed and well-nourished.  Neck: Normal range of motion.  Cardiovascular: Regular rhythm and normal heart sounds.   Respiratory: Effort normal and breath sounds normal.  GI: Soft. Bowel sounds are normal.  Musculoskeletal: Normal range of motion.  Neurological: She is alert and oriented to person, place, and time.  Skin: Skin is warm and dry.  Bruising on knees and right great toe, bruising on toes    Current Medications:   Current Outpatient Prescriptions  Medication Sig Dispense Refill  . amiodarone  (PACERONE) 200 MG tablet TAKE 1 TABLET BY MOUTH DAILY. 90 tablet 3  . carvedilol (COREG) 3.125 MG tablet TAKE 3 TABLETS BY MOUTH TWICE DAILY WITH A MEAL. 180 tablet 3  . colchicine 0.6 MG tablet Take 0.6 mg by mouth 2 (two) times daily as needed.     . digoxin (LANOXIN) 0.125 MG tablet Take 0.5 tablets (0.0625 mg total) by mouth every other day. 30 tablet 3  . feeding supplement, ENSURE ENLIVE, (ENSURE ENLIVE) LIQD Take 237 mLs by mouth 2 (two) times daily between meals. (Patient taking differently: Take 237 mLs by mouth daily. ) 237 mL 12  . ferrous sulfate 324 (65 FE) MG TBEC Take 1 tablet (325 mg total) by mouth 2 (two) times daily. 60 tablet   . furosemide (LASIX) 40 MG tablet Take 1 tablet (40 mg total) by mouth 2 (two) times daily. 90 tablet 3  . glimepiride (AMARYL) 2 MG tablet Take 2 mg by mouth daily.    . isosorbide mononitrate (IMDUR) 30 MG 24 hr tablet Take 1 tablet (30 mg total) by mouth daily. 30 tablet 0  . levothyroxine (SYNTHROID, LEVOTHROID) 25 MCG tablet Take 1 tablet (25 mcg total) by mouth daily before breakfast. 30 tablet 0  . lisinopril (PRINIVIL,ZESTRIL) 10 MG tablet Take 1 tablet (10 mg total) by mouth daily. 30 tablet 3  . metoCLOPramide (REGLAN) 5 MG tablet Take 5 mg by mouth 2 (two) times daily.     . ondansetron (ZOFRAN) 4 MG tablet Take 4 mg by mouth every  4 (four) hours as needed for nausea or vomiting.    Marland Kitchen oxyCODONE-acetaminophen (PERCOCET) 10-325 MG per tablet Take 1 tablet by mouth every 6 (six) hours as needed for pain.     . polyethylene glycol (MIRALAX / GLYCOLAX) packet Take 17 g by mouth daily as needed for mild constipation.    . pravastatin (PRAVACHOL) 40 MG tablet Take 80 mg by mouth at bedtime.     Marland Kitchen spironolactone (ALDACTONE) 25 MG tablet Take 0.5 tablets (12.5 mg total) by mouth daily. 45 tablet 3  . warfarin (COUMADIN) 2.5 MG tablet Take 0.5-1 tablets (1.25-2.5 mg total) by mouth daily at 6 PM. Takes 1 tablet on Mon, Wed, Fri, take 0.5 tablet on all  other days     No current facility-administered medications for this visit.    Functional Status:   In your present state of health, do you have any difficulty performing the following activities: 01/31/2015 01/08/2015  Hearing? N N  Vision? N N  Difficulty concentrating or making decisions? N N  Walking or climbing stairs? N N  Dressing or bathing? Y Y  Doing errands, shopping? Tempie Donning  Preparing Food and eating ? Y -  Using the Toilet? N -  In the past six months, have you accidently leaked urine? Y -  Do you have problems with loss of bowel control? N -  Managing your Medications? Y -  Managing your Finances? N -  Housekeeping or managing your Housekeeping? Y -    Fall/Depression Screening:   Fall Risk  03/02/2015 01/23/2015 12/28/2014  Falls in the past year? Yes Yes Yes  Number falls in past yr: 1 1 1   Injury with Fall? No No No  Risk for fall due to : Impaired balance/gait;History of fall(s) History of fall(s);Impaired balance/gait;Impaired mobility History of fall(s);Impaired mobility  Risk for fall due to (comments): uses walker or can if she goes out - -  Follow up Falls prevention discussed;Education provided Education provided Falls evaluation completed    PHQ 2/9 Scores 01/23/2015 12/28/2014  PHQ - 2 Score 0 0   ASSESSMENT:  Risk for falls Hypotension ? Sleep apnea  Plan:  Reviewed safety Reviewed for signs and symptoms of low BP Gave EMMI education on Sleep study, encouraged her to try new CPAP if she ends up needing the treatment after the study is completed  Patient and/or spouse will record BP and CBG readings in blue [facility] calendar Patient will go to Sleep study  Patient will keep upcoming appointments Patient and/or spouse will report low BP readings or any signs or symptoms  RNCM will visit again next month  Mary E. Laymond Purser, RN, BSN, [place] [phone] [facility] CM Care Plan Problem One        Most Recent Value   Care Plan Problem One   Inpatient Hospitalzation for infective endocarditis   Role Documenting the Problem One  Care Management Coordinator   Care Plan for Problem One  Active   [facility] Long Term Goal (31-90 days)  Patient will not have hospitalization for infective endocarditis in the next 90 days   [facility] Long Term Goal Start Date  12/18/14   Interventions for Problem One Long Term Goal  Using teachback method reviewed for any signs or symptoms of infection, encouraged patient to keep all appoitntments    [facility] CM Care Plan Problem Two        Most Recent Value   Care Plan Problem Two  Patient Blood  pressure running low as evidenced by VS readings   Role Documenting the Problem Two  Care Management [place] for Problem Two  Active   Interventions for Problem Two Long Term Goal   Using teachback method reinstructed patient to have spouse record BP in log and to take readings to appointments   [facility] Long Term Goal (31-90) days  Patient BP will be within normal limits over the next 90 days   [facility]N Long Term Goal Start Date  03/02/15 [restarted as patient and spouse not keeping BP record]   [facility] CM Short Term Goal #1 (0-30 days)  Patient spouse will monitor and record BP readings over the next 30 days   [facility] CM Short Term Goal #1 Start Date  03/02/15 Billy Fischer as they have not been recording BP readings]   Interventions for Short Term Goal #2   Reinforced need to check BP daily and record them, also to report any low readings and any signs and symptoms    THN CM Care Plan Problem Three        Most Recent Value   Care Plan Problem Three  High for falls as evidenced by fall last week   Role Documenting the Problem Three  Care Management [place] for Problem Three  Active   [facility]N Long Term Goal (31-90) days  Patient will not have any falls over the next 60 days   [facility]N Long Term Goal Start Date  03/02/15   Interventions for Problem Three Long Term Goal  Using teachback method, reviewed cause of last week's  fall with patient and reivewed for signs and symptoms of low BP, reivewed signs and symptoms of Low BP to report, Reviewed safety

## 2015-03-04 ENCOUNTER — Ambulatory Visit: Payer: Commercial Managed Care - HMO | Attending: Cardiology | Admitting: Sleep Medicine

## 2015-03-04 VITALS — Ht 65.0 in | Wt 150.0 lb

## 2015-03-04 DIAGNOSIS — G4730 Sleep apnea, unspecified: Secondary | ICD-10-CM

## 2015-03-04 DIAGNOSIS — G4733 Obstructive sleep apnea (adult) (pediatric): Secondary | ICD-10-CM | POA: Diagnosis not present

## 2015-03-04 DIAGNOSIS — G4710 Hypersomnia, unspecified: Secondary | ICD-10-CM

## 2015-03-04 DIAGNOSIS — R0683 Snoring: Secondary | ICD-10-CM | POA: Diagnosis present

## 2015-03-05 ENCOUNTER — Ambulatory Visit (HOSPITAL_COMMUNITY): Payer: Commercial Managed Care - HMO | Admitting: Hematology & Oncology

## 2015-03-05 ENCOUNTER — Other Ambulatory Visit (HOSPITAL_COMMUNITY): Payer: Commercial Managed Care - HMO

## 2015-03-05 ENCOUNTER — Ambulatory Visit (HOSPITAL_COMMUNITY): Payer: Commercial Managed Care - HMO

## 2015-03-06 ENCOUNTER — Encounter (HOSPITAL_COMMUNITY): Payer: Commercial Managed Care - HMO

## 2015-03-06 ENCOUNTER — Encounter (HOSPITAL_COMMUNITY): Payer: Commercial Managed Care - HMO | Attending: Hematology & Oncology | Admitting: Oncology

## 2015-03-06 ENCOUNTER — Encounter (HOSPITAL_COMMUNITY): Payer: Self-pay | Admitting: Oncology

## 2015-03-06 ENCOUNTER — Encounter (HOSPITAL_BASED_OUTPATIENT_CLINIC_OR_DEPARTMENT_OTHER): Payer: Commercial Managed Care - HMO

## 2015-03-06 VITALS — BP 105/58 | HR 48 | Temp 98.3°F | Wt 152.0 lb

## 2015-03-06 DIAGNOSIS — D631 Anemia in chronic kidney disease: Secondary | ICD-10-CM | POA: Diagnosis not present

## 2015-03-06 DIAGNOSIS — D638 Anemia in other chronic diseases classified elsewhere: Secondary | ICD-10-CM | POA: Diagnosis not present

## 2015-03-06 DIAGNOSIS — N184 Chronic kidney disease, stage 4 (severe): Secondary | ICD-10-CM

## 2015-03-06 DIAGNOSIS — I5043 Acute on chronic combined systolic (congestive) and diastolic (congestive) heart failure: Secondary | ICD-10-CM

## 2015-03-06 DIAGNOSIS — N189 Chronic kidney disease, unspecified: Secondary | ICD-10-CM | POA: Diagnosis not present

## 2015-03-06 DIAGNOSIS — Z7901 Long term (current) use of anticoagulants: Secondary | ICD-10-CM | POA: Insufficient documentation

## 2015-03-06 DIAGNOSIS — E611 Iron deficiency: Secondary | ICD-10-CM | POA: Diagnosis not present

## 2015-03-06 LAB — CBC
HCT: 35.2 % — ABNORMAL LOW (ref 36.0–46.0)
Hemoglobin: 11.6 g/dL — ABNORMAL LOW (ref 12.0–15.0)
MCH: 31.9 pg (ref 26.0–34.0)
MCHC: 33 g/dL (ref 30.0–36.0)
MCV: 96.7 fL (ref 78.0–100.0)
PLATELETS: 190 10*3/uL (ref 150–400)
RBC: 3.64 MIL/uL — AB (ref 3.87–5.11)
RDW: 13.4 % (ref 11.5–15.5)
WBC: 5.6 10*3/uL (ref 4.0–10.5)

## 2015-03-06 LAB — FERRITIN: FERRITIN: 65 ng/mL (ref 11–307)

## 2015-03-06 NOTE — Addendum Note (Signed)
Addended by: Baird Cancer on: 03/06/2015 03:27 PM   Modules accepted: Orders, Level of Service

## 2015-03-06 NOTE — Progress Notes (Addendum)
Robert Bellow, MD [place] [id number]  Anemia of chronic disease  Iron deficiency - Plan: ferric gluconate (NULECIT) 125 mg in sodium chloride 0.9 % 100 mL IVPB, ferric gluconate (NULECIT) 125 mg in sodium chloride 0.9 % 100 mL IVPB  CURRENT THERAPY:  Aranesp 60 mcg every 2 weeks beginning on 01/22/2015  INTERVAL HISTORY: [name] 69 y.o. female returns for followup of anemia of chronic renal disease.  I personally reviewed and went over laboratory results with the patient.  The results are noted within this dictation.  Thus far, she has experienced an excellent response to ESA therapy.  Her Hgb is greater than 11.1 g/dL and therefore, she does not qualify for ESA therapy today.  We will continue to monitor labs and administer Aranesp if/when Hgb is less than 11.1 g/dL.  He appetite is stable.  Her weight is stable as well.  She notes that she feels better than she has in the past.    Past Medical History  Diagnosis Date  . Cardiomyopathy, nonischemic     a. 1999 nl cath;  b. 12/05 [facility] [place];  c. 10/2005 ICD extraction 2/2 enterococcus bacteremia and Veg on RV lead;  c. 05/2008 low risk Myoview (scarring w/ some evidence of inf ischemia);  d. 11/2012 Echo: EF 15-20%;  e. 01/2013 s/p MDT [place] CRT D, ser # [id number];  f. 05/2013 Echo: EF 15%.  . Enterococcal infection     a. 10/2005 - AICD-explanted  . Pulmonary embolism     a. 07/2005 after total right hip arthroplasty  . Hilar density     a. infrahilar mass/adenopathy on CT scan 5/07; subsequently  resolved  . LBBB (left bundle branch block)   . GERD (gastroesophageal reflux disease)   . Hyperlipidemia   . Hypertension   . Tobacco abuse     a. discontinued in 1997, and then resumed  . Urinary incontinence   . Anemia     a. mild/chronic  . Villous adenoma of colon     a. tubovillous adenomatous polyp with focal high grade dysplasia; presented with hematochezia - followed by Dr.  Laural Golden.  . Implantable cardioverter-defibrillator-CRT- Mdt     a.  01/2013 s/p MDT [place] CRT D, ser # [id number]  . Chronic systolic CHF (congestive heart failure)     a. 11/2012 Echo: EF 15-20%;  b. 05/2013 TEE EF 15%.  . Pneumonia 07/2005  . Obstructive sleep apnea     a. mild-did not tolerate CPAP (01/24/2013)  . History of blood transfusion   . Degenerative joint disease     of knees, shoulder, and hips  . PAF (paroxysmal atrial fibrillation)     a. 07/2012 s/p TEE/DCCV;  b. chronic coumadin;  c. 05/2013 Recurrent Afib->TEE/DCCV and amio initiation.  . CKD (chronic kidney disease), stage III     creatinin-1.44 in 1/09; 1.51 in 1/10  . Type II diabetes mellitus     type 2    has Dyslipidemia; HTN (hypertension); LBBB; History of pulmonary embolism; Type 2 diabetes, uncontrolled, with renal manifestation; Tobacco abuse, in remission; CKD (chronic kidney disease), stage IV; Chronic combined systolic and diastolic CHF (congestive heart failure); Chronic anticoagulation; Atrial fibrillation; Implantable cardioverter-defibrillator-CRT- Mdt; Symptomatic anemia; COPD (chronic obstructive pulmonary disease); Anemia of chronic disease; Hypothyroidism; Fever; Acute on chronic combined systolic and diastolic CHF (congestive heart failure); SOB (shortness of breath); Bacteremia; ICD (implantable cardioverter-defibrillator) infection; Enterococcal bacteremia; Diabetic feet; Healthcare-associated pneumonia;  HCAP (healthcare-associated pneumonia); Acute respiratory failure with hypoxia; Paroxysmal atrial fibrillation; and Diabetes mellitus type 2 with complications on her problem list.     has No Known Allergies.  Current Outpatient Prescriptions on File Prior to Visit  Medication Sig Dispense Refill  . amiodarone (PACERONE) 200 MG tablet TAKE 1 TABLET BY MOUTH DAILY. 90 tablet 3  . carvedilol (COREG) 3.125 MG tablet TAKE 3 TABLETS BY MOUTH TWICE DAILY WITH A MEAL. 180 tablet 3  . colchicine 0.6 MG tablet  Take 0.6 mg by mouth 2 (two) times daily as needed.     . digoxin (LANOXIN) 0.125 MG tablet Take 0.5 tablets (0.0625 mg total) by mouth every other day. 30 tablet 3  . feeding supplement, ENSURE ENLIVE, (ENSURE ENLIVE) LIQD Take 237 mLs by mouth 2 (two) times daily between meals. (Patient taking differently: Take 237 mLs by mouth daily. ) 237 mL 12  . ferrous sulfate 324 (65 FE) MG TBEC Take 1 tablet (325 mg total) by mouth 2 (two) times daily. 60 tablet   . furosemide (LASIX) 40 MG tablet Take 1 tablet (40 mg total) by mouth 2 (two) times daily. 90 tablet 3  . glimepiride (AMARYL) 2 MG tablet Take 2 mg by mouth daily.    . isosorbide mononitrate (IMDUR) 30 MG 24 hr tablet Take 1 tablet (30 mg total) by mouth daily. 30 tablet 0  . levothyroxine (SYNTHROID, LEVOTHROID) 25 MCG tablet Take 1 tablet (25 mcg total) by mouth daily before breakfast. 30 tablet 0  . lisinopril (PRINIVIL,ZESTRIL) 10 MG tablet Take 1 tablet (10 mg total) by mouth daily. 30 tablet 3  . metoCLOPramide (REGLAN) 5 MG tablet Take 5 mg by mouth 2 (two) times daily.     . ondansetron (ZOFRAN) 4 MG tablet Take 4 mg by mouth every 4 (four) hours as needed for nausea or vomiting.    Marland Kitchen oxyCODONE-acetaminophen (PERCOCET) 10-325 MG per tablet Take 1 tablet by mouth every 6 (six) hours as needed for pain.     . polyethylene glycol (MIRALAX / GLYCOLAX) packet Take 17 g by mouth daily as needed for mild constipation.    . pravastatin (PRAVACHOL) 40 MG tablet Take 80 mg by mouth at bedtime.     Marland Kitchen spironolactone (ALDACTONE) 25 MG tablet Take 0.5 tablets (12.5 mg total) by mouth daily. 45 tablet 3  . warfarin (COUMADIN) 2.5 MG tablet Take 0.5-1 tablets (1.25-2.5 mg total) by mouth daily at 6 PM. Takes 1 tablet on Mon, Wed, Fri, take 0.5 tablet on all other days     No current facility-administered medications on file prior to visit.    Past Surgical History  Procedure Laterality Date  . Pacemaker removal  11/18/05    Enterococcal infection    . Total hip arthroplasty Right 07/2005  . Knee arthroscopy Right 1980's?  . Colonoscopy w/ polypectomy  2009  . Cardioversion N/A 08/20/2012    Procedure: TEE GUIDED CARDIOVERSION;  Surgeon: Yehuda Savannah, MD;  Location: [facility];  Service: Cardiovascular;  Laterality: N/A;  To be done @ bedside  . Tee without cardioversion N/A 08/20/2012    Procedure: TRANSESOPHAGEAL ECHOCARDIOGRAM (TEE);  Surgeon: Yehuda Savannah, MD;  Location: [facility];  Service: Cardiovascular;  Laterality: N/A;  . Bi-ventricular implantable cardioverter defibrillator  (crt-d)  01/24/2013  . A-v cardiac pacemaker insertion  12/05    Biventricular pacemaker/AICD  . Abdominal hysterectomy  1990/92    Initial partial hysterectomy followed by BSO  . Tubal ligation  1980's  .  Cardiac catheterization    . Colonoscopy with esophagogastroduodenoscopy (egd) N/A 06/10/2013    Procedure: COLONOSCOPY WITH ESOPHAGOGASTRODUODENOSCOPY (EGD);  Surgeon: Rogene Houston, MD;  Location: [facility];  Service: Endoscopy;  Laterality: N/A;  925  . Tee without cardioversion N/A 06/20/2013    Procedure: TRANSESOPHAGEAL ECHOCARDIOGRAM (TEE);  Surgeon: Dorothy Spark, MD;  Location: [place];  Service: Cardiovascular;  Laterality: N/A;  . Cardioversion N/A 06/20/2013    Procedure: CARDIOVERSION;  Surgeon: Dorothy Spark, MD;  Location[facility] ENDOSCOPY;  Service: Cardiovascular;  Laterality: N/A;  . Bi-ventricular implantable cardioverter defibrillator N/A 01/24/2013    Procedure: BI-VENTRICULAR IMPLANTABLE CARDIOVERTER DEFIBRILLATOR  (CRT-D);  Surgeon: Evans Lance, MD;  Location: [facility] CATH LAB;  Service: Cardiovascular;  Laterality: N/A;  . Right heart catheterization N/A 04/18/2014    Procedure: RIGHT HEART CATH;  Surgeon: Larey Dresser, MD;  Location: [facility] CATH LAB;  Service: Cardiovascular;  Laterality: N/A;  . Esophagogastroduodenoscopy N/A 10/21/2014    Procedure: ESOPHAGOGASTRODUODENOSCOPY (EGD);  Surgeon: Inda Castle, MD;   Location: [place];  Service: Endoscopy;  Laterality: N/A;  . Colonoscopy Left 10/24/2014    Procedure: COLONOSCOPY;  Surgeon: Carol Ada, MD;  Location: [facility] ENDOSCOPY;  Service: Endoscopy;  Laterality: Left;  Freda Munro capsule study N/A 10/24/2014    Procedure: GIVENS CAPSULE STUDY;  Surgeon: Carol Ada, MD;  Location: [facility] ENDOSCOPY;  Service: Endoscopy;  Laterality: N/A;  . Tee without cardioversion N/A 12/08/2014    Procedure: TRANSESOPHAGEAL ECHOCARDIOGRAM (TEE);  Surgeon: Fay Records, MD;  [facility]: [facility];  Service: Cardiovascular;  Laterality: N/A;  . Icd lead removal N/A 12/13/2014    Procedure: ICD LEAD REMOVAL/EXTRACTION ;  Surgeon: Evans Lance, MD;  Location: [facility] OR;  Service: Cardiovascular;  Laterality: N/A;  [place] back up    Denies any headaches, dizziness, double vision, fevers, chills, night sweats, nausea, vomiting, diarrhea, constipation, chest pain, heart palpitations, shortness of breath, blood in stool, black tarry stool, urinary pain, urinary burning, urinary frequency, hematuria.   PHYSICAL EXAMINATION  ECOG PERFORMANCE STATUS: 1 - Symptomatic but completely ambulatory  Filed Vitals:   03/06/15 1024  BP: 105/58  Pulse: 48  Temp: 98.3 F (36.8 C)    GENERAL:alert, no distress, well nourished, well developed, comfortable, cooperative, smiling and chronically ill appearing and accompanied by her husband. SKIN: skin color, texture, turgor are normal, no rashes or significant lesions, SK on forehead and lesion on bridge of nose that we will need to follow. HEAD: Normocephalic, No masses, lesions, tenderness or abnormalities EYES: normal, PERRLA, EOMI, Conjunctiva are pink and non-injected EARS: External ears normal OROPHARYNX:lips, buccal mucosa, and tongue normal and mucous membranes are moist  NECK: supple, no adenopathy, thyroid normal size, non-tender, without nodularity, trachea midline LYMPH:  no palpable lymphadenopathy BREAST:not examined LUNGS:  clear to auscultation and percussion HEART: regular rate & rhythm, no murmurs, no gallops, S1 normal and S2 normal ABDOMEN:abdomen soft, non-tender and no masses or organomegaly BACK: Back symmetric, no curvature., No CVA tenderness EXTREMITIES:less then 2 second capillary refill, no joint deformities, effusion, or inflammation, no skin discoloration, no cyanosis  NEURO: alert & oriented x 3 with fluent speech, no focal motor/sensory deficits   LABORATORY DATA: CBC    Component Value Date/Time   WBC 5.6 03/06/2015 1009   RBC 3.64* 03/06/2015 1009   RBC 2.52* 10/18/2014 0850   HGB 11.6* 03/06/2015 1009   HCT 35.2* 03/06/2015 1009   PLT 190 03/06/2015 1009   MCV 96.7 03/06/2015  1009   MCH 31.9 03/06/2015 1009   MCHC 33.0 03/06/2015 1009   RDW 13.4 03/06/2015 1009   LYMPHSABS 0.8 01/22/2015 0910   MONOABS 0.3 01/22/2015 0910   EOSABS 0.1 01/22/2015 0910   BASOSABS 0.0 01/22/2015 0910      Chemistry      Component Value Date/Time   NA 141 02/06/2015 1224   K 4.8 02/06/2015 1224   CL 105 02/06/2015 1224   CO2 28 02/06/2015 1224   BUN 53* 02/06/2015 1224   CREATININE 2.26* 02/06/2015 1224   CREATININE 1.17* 01/20/2013 1010      Component Value Date/Time   CALCIUM 9.6 02/06/2015 1224   ALKPHOS 66 02/06/2015 1224   AST 18 02/06/2015 1224   ALT 14 02/06/2015 1224   BILITOT 0.9 02/06/2015 1224        PENDING LABS:   RADIOGRAPHIC STUDIES:  Dg Chest 2 View  02/23/2015   CLINICAL DATA:  Right lateral chest pain, aspiration pneumonia  EXAM: CHEST  2 VIEW  COMPARISON:  01/07/2015  FINDINGS: Near complete resolution of the previously seen right lower lobe predominant airspace opacity is identified. Right-sided PICC line has been removed. Moderate enlargement of the cardiac silhouette is reidentified. Left lung grossly clear. Mild hyperinflation suggests emphysema. No pleural effusion. No acute osseous abnormality.  IMPRESSION: Near complete resolution of previously seen right  lower lobe airspace disease. Consider PA and lateral chest radiographs in 4-6 weeks to ensure complete resolution and help exclude underlying malignancy.   Electronically Signed   By: Conchita Paris M.D.   On: 02/23/2015 15:09     PATHOLOGY:    ASSESSMENT AND PLAN:  Anemia of chronic disease Anemia of chronic renal disease, currently on Aranesp 60 mcg every 2 weeks beginning on 01/22/2015.  I personally reviewed and went over laboratory results with the patient.  The results are noted within this dictation.  Supportive therapy plan reviewed.  Labs today: CBC, ferritin  Labs every 2 weeks: CBC  Labs every 6 weeks: Ferritin.  Return in 8 weeks for follow-up.  Addendum: Ferritin returned and is noted to be less than 100.  Iron deficit is nearly 250 mg.  Orders placed for IV ferric gluconate 125 mg x 2.   THERAPY PLAN:  Continue with ESA therapy and we will adjust the dose according to lab results.  Additionally, we will monitor iron stores and replaced as needed. If she goes 2 months without needing ESA, will space out frequency to every 4 weeks.  All questions were answered. The patient knows to call the clinic with any problems, questions or concerns. We can certainly see the patient much sooner if necessary.  Patient and plan discussed with Dr. Ancil Linsey and she is in agreement with the aforementioned.   This note is electronically signed by: Doy Mince 03/06/2015 3:27 PM

## 2015-03-06 NOTE — Assessment & Plan Note (Addendum)
Anemia of chronic renal disease, currently on Aranesp 60 mcg every 2 weeks beginning on 01/22/2015.  I personally reviewed and went over laboratory results with the patient.  The results are noted within this dictation.  Supportive therapy plan reviewed.  Labs today: CBC, ferritin  Labs every 2 weeks: CBC  Labs every 6 weeks: Ferritin.  Return in 8 weeks for follow-up.  Addendum: Ferritin returned and is noted to be less than 100.  Iron deficit is nearly 250 mg.  Orders placed for IV ferric gluconate 125 mg x 2.

## 2015-03-06 NOTE — Progress Notes (Signed)
Labs drawn

## 2015-03-06 NOTE — Progress Notes (Signed)
See office visit encounter. 

## 2015-03-06 NOTE — Patient Instructions (Signed)
..  [place] at [facility] Discharge Instructions  RECOMMENDATIONS MADE BY THE CONSULTANT AND ANY TEST RESULTS WILL BE SENT TO YOUR REFERRING PHYSICIAN.  Continue cbc every 2 weeks  Ferritin every 6 weeks  Continue aranesp every 2 weeks  Return in 8 weeks f/u  No aranesp today  Thank you for choosing [place] at [facility] to provide your oncology and hematology care.  To afford each patient quality time with our provider, please arrive at least 15 minutes before your scheduled appointment time.    You need to re-schedule your appointment should you arrive 10 or more minutes late.  We strive to give you quality time with our providers, and arriving late affects you and other patients whose appointments are after yours.  Also, if you no show three or more times for appointments you may be dismissed from the clinic at the providers discretion.     Again, thank you for choosing [facility].  Our hope is that these requests will decrease the amount of time that you wait before being seen by our physicians.       _____________________________________________________________  Should you have questions after your visit to [facility], please contact our office at [phone] between the hours of 8:30 a.m. and 4:30 p.m.  Voicemails left after 4:30 p.m. will not be returned until the following business day.  For prescription refill requests, have your pharmacy contact our office.

## 2015-03-09 ENCOUNTER — Encounter (HOSPITAL_BASED_OUTPATIENT_CLINIC_OR_DEPARTMENT_OTHER): Payer: Commercial Managed Care - HMO

## 2015-03-09 ENCOUNTER — Encounter (HOSPITAL_COMMUNITY): Payer: Self-pay

## 2015-03-09 ENCOUNTER — Other Ambulatory Visit (HOSPITAL_COMMUNITY): Payer: Self-pay | Admitting: Respiratory Therapy

## 2015-03-09 VITALS — BP 123/58 | HR 55 | Temp 97.6°F | Resp 18

## 2015-03-09 DIAGNOSIS — D509 Iron deficiency anemia, unspecified: Secondary | ICD-10-CM | POA: Diagnosis not present

## 2015-03-09 DIAGNOSIS — E611 Iron deficiency: Secondary | ICD-10-CM

## 2015-03-09 MED ORDER — SODIUM CHLORIDE 0.9 % IV SOLN
125.0000 mg | Freq: Once | INTRAVENOUS | Status: AC
  Administered 2015-03-09: 125 mg via INTRAVENOUS
  Filled 2015-03-09: qty 10

## 2015-03-09 MED ORDER — SODIUM CHLORIDE 0.9 % IV SOLN
INTRAVENOUS | Status: DC
  Administered 2015-03-09: 09:00:00 via INTRAVENOUS

## 2015-03-09 NOTE — Patient Instructions (Signed)
[place] at [facility] Discharge Instructions  RECOMMENDATIONS MADE BY THE CONSULTANT AND ANY TEST RESULTS WILL BE SENT TO YOUR REFERRING PHYSICIAN.  Iron infusion today. Return as scheduled next week for iron infusion. Return as scheduled for lab work and possible Aranesp injection.  Thank you for choosing [place] at [facility] to provide your oncology and hematology care.  To afford each patient quality time with our provider, please arrive at least 15 minutes before your scheduled appointment time.    You need to re-schedule your appointment should you arrive 10 or more minutes late.  We strive to give you quality time with our providers, and arriving late affects you and other patients whose appointments are after yours.  Also, if you no show three or more times for appointments you may be dismissed from the clinic at the providers discretion.     Again, thank you for choosing [facility].  Our hope is that these requests will decrease the amount of time that you wait before being seen by our physicians.       _____________________________________________________________  Should you have questions after your visit to [facility], please contact our office at [phone] between the hours of 8:30 a.m. and 4:30 p.m.  Voicemails left after 4:30 p.m. will not be returned until the following business day.  For prescription refill requests, have your pharmacy contact our office.

## 2015-03-09 NOTE — Progress Notes (Signed)
1030:  Tolerated infusion w/o adverse reaction.  A&Ox4, in no distress.  VSS.  Discharged ambulatory in c/o husband.

## 2015-03-12 ENCOUNTER — Encounter (HOSPITAL_BASED_OUTPATIENT_CLINIC_OR_DEPARTMENT_OTHER): Payer: Commercial Managed Care - HMO

## 2015-03-12 VITALS — BP 121/59 | HR 54 | Temp 98.3°F | Resp 16

## 2015-03-12 DIAGNOSIS — D638 Anemia in other chronic diseases classified elsewhere: Secondary | ICD-10-CM

## 2015-03-12 DIAGNOSIS — E611 Iron deficiency: Secondary | ICD-10-CM

## 2015-03-12 MED ORDER — SODIUM CHLORIDE 0.9 % IV SOLN
125.0000 mg | Freq: Once | INTRAVENOUS | Status: AC
  Administered 2015-03-12: 125 mg via INTRAVENOUS
  Filled 2015-03-12: qty 10

## 2015-03-12 MED ORDER — SODIUM CHLORIDE 0.9 % IV SOLN
INTRAVENOUS | Status: DC
  Administered 2015-03-12: 13:00:00 via INTRAVENOUS

## 2015-03-12 NOTE — Progress Notes (Signed)
Patient tolerated infusion well.  VSS 30 minutes post infusion.   

## 2015-03-12 NOTE — Patient Instructions (Signed)
[place] at [facility] Discharge Instructions  RECOMMENDATIONS MADE BY THE CONSULTANT AND ANY TEST RESULTS WILL BE SENT TO YOUR REFERRING PHYSICIAN. You received Iron IV today.  Please call the clinic with any questions or concerns.  Thank you for choosing [place] at [facility] to provide your oncology and hematology care.  To afford each patient quality time with our provider, please arrive at least 15 minutes before your scheduled appointment time.    You need to re-schedule your appointment should you arrive 10 or more minutes late.  We strive to give you quality time with our providers, and arriving late affects you and other patients whose appointments are after yours.  Also, if you no show three or more times for appointments you may be dismissed from the clinic at the providers discretion.     Again, thank you for choosing [facility].  Our hope is that these requests will decrease the amount of time that you wait before being seen by our physicians.       _____________________________________________________________  Should you have questions after your visit to [facility], please contact our office at [phone] between the hours of 8:30 a.m. and 4:30 p.m.  Voicemails left after 4:30 p.m. will not be returned until the following business day.  For prescription refill requests, have your pharmacy contact our office.

## 2015-03-12 NOTE — Sleep Study (Signed)
  [place] A. Merlene Laughter, MD     [email or web address]             NOCTURNAL POLYSOMNOGRAPHY   LOCATION: [facility]   Patient Name: Tina, [name] Date: 03/04/2015 Gender: Not Specified D.O.B: Mar 10, 1946 Age (years): 21 Referring Nishi Neiswonger: Loralie Champagne Height (inches): 65 Interpreting Physician: Phillips Odor MD, ABSM Weight (lbs): 150 RPSGT: Rosebud Poles BMI: 25 MRN: [id number] Neck Size: 15.00 CLINICAL INFORMATION Sleep Study Type: Split Night CPAP  Indication for sleep study: Snoring  Epworth Sleepiness Score: 10  SLEEP STUDY TECHNIQUE As per the AASM Manual for the Scoring of Sleep and Associated Events v2.3 (April 2016) with a hypopnea requiring 4% desaturations.  The channels recorded and monitored were frontal, central and occipital EEG, electrooculogram (EOG), submentalis EMG (chin), nasal and oral airflow, thoracic and abdominal wall motion, anterior tibialis EMG, snore microphone, electrocardiogram, and pulse oximetry. Continuous positive airway pressure (CPAP) was initiated when the patient met split night criteria and was titrated according to treat sleep-disordered breathing.  MEDICATIONS Medications taken by the patient : N/A  Medications administered by patient during sleep study : No sleep medicine administered. RESPIRATORY PARAMETERS Diagnostic  Total AHI (/hr): 32.8 RDI (/hr): 32.8 OA Index (/hr): 3.2 CA Index (/hr): 0.8 REM AHI (/hr): 25.5 NREM AHI (/hr): 34.2 Supine AHI (/hr): 40.2 Non-supine AHI (/hr): 16.77 Min O2 Sat (%): 89.00 Mean O2 (%): 94.09 Time below 88% (min): 0.0   Titration  Optimal Pressure (cm):  AHI at Optimal Pressure (/hr): N/A Min O2 at Optimal Pressure (%): 89.00 Supine % at Optimal (%): N/A Sleep % at Optimal (%): N/A   SLEEP ARCHITECTURE The recording time for the entire night was 418.5 minutes.  During a baseline period of 161.3 minutes, the patient slept for 148.0 minutes in REM and nonREM, yielding a  sleep efficiency of 91.8%. Sleep onset after lights out was 1.7 minutes with a REM latency of 52.0 minutes. The patient spent 1.35% of the night in stage N1 sleep, 50.68% in stage N2 sleep, 32.09% in stage N3 and 15.88% in REM.  During the titration period of 254.7 minutes, the patient slept for 208.3 minutes in REM and nonREM, yielding a sleep efficiency of 81.8%. Sleep onset after CPAP initiation was 15.4 minutes with a REM latency of 73.5 minutes. The patient spent 0.96% of the night in stage N1 sleep, 69.51% in stage N2 sleep, 17.77% in stage N3 and 11.76% in REM.  CARDIAC DATA The 2 lead EKG demonstrated sinus rhythm. The mean heart rate was 47.79 beats per minute. Other EKG findings include: None. LEG MOVEMENT DATA The total Periodic Limb Movements of Sleep (PLMS) were 0. The PLMS index was 0.00 .  IMPRESSIONS Moderate to severe obstructive sleep apnea occurred during the diagnostic portion of the study (AHI = 32.8/hour). An optimal CPAP pressure of 8 is reached per the study, but CPAP 9 is suggested as there were some residual events on 8.     Delano Metz, MD Diplomate, American Board of Sleep Medicine.

## 2015-03-13 ENCOUNTER — Ambulatory Visit: Payer: Commercial Managed Care - HMO | Admitting: Urology

## 2015-03-14 ENCOUNTER — Other Ambulatory Visit (HOSPITAL_COMMUNITY): Payer: Self-pay

## 2015-03-14 ENCOUNTER — Other Ambulatory Visit (HOSPITAL_COMMUNITY): Payer: Self-pay | Admitting: Respiratory Therapy

## 2015-03-16 ENCOUNTER — Other Ambulatory Visit (HOSPITAL_COMMUNITY): Payer: Self-pay

## 2015-03-16 DIAGNOSIS — D638 Anemia in other chronic diseases classified elsewhere: Secondary | ICD-10-CM

## 2015-03-19 NOTE — Progress Notes (Signed)
This encounter was created in error - please disregard.

## 2015-03-20 ENCOUNTER — Encounter (HOSPITAL_BASED_OUTPATIENT_CLINIC_OR_DEPARTMENT_OTHER): Payer: Commercial Managed Care - HMO

## 2015-03-20 VITALS — BP 116/58 | HR 52 | Temp 98.4°F | Resp 16

## 2015-03-20 DIAGNOSIS — D631 Anemia in chronic kidney disease: Secondary | ICD-10-CM

## 2015-03-20 DIAGNOSIS — D638 Anemia in other chronic diseases classified elsewhere: Secondary | ICD-10-CM | POA: Diagnosis not present

## 2015-03-20 DIAGNOSIS — N189 Chronic kidney disease, unspecified: Secondary | ICD-10-CM | POA: Diagnosis not present

## 2015-03-20 DIAGNOSIS — N184 Chronic kidney disease, stage 4 (severe): Secondary | ICD-10-CM

## 2015-03-20 DIAGNOSIS — D649 Anemia, unspecified: Secondary | ICD-10-CM

## 2015-03-20 LAB — CBC
HEMATOCRIT: 33 % — AB (ref 36.0–46.0)
HEMOGLOBIN: 10.9 g/dL — AB (ref 12.0–15.0)
MCH: 31.8 pg (ref 26.0–34.0)
MCHC: 33 g/dL (ref 30.0–36.0)
MCV: 96.2 fL (ref 78.0–100.0)
Platelets: 149 10*3/uL — ABNORMAL LOW (ref 150–400)
RBC: 3.43 MIL/uL — ABNORMAL LOW (ref 3.87–5.11)
RDW: 12.8 % (ref 11.5–15.5)
WBC: 5 10*3/uL (ref 4.0–10.5)

## 2015-03-20 MED ORDER — DARBEPOETIN ALFA 60 MCG/0.3ML IJ SOSY
60.0000 ug | PREFILLED_SYRINGE | Freq: Once | INTRAMUSCULAR | Status: AC
  Administered 2015-03-20: 60 ug via SUBCUTANEOUS

## 2015-03-20 MED ORDER — DARBEPOETIN ALFA 60 MCG/0.3ML IJ SOSY
PREFILLED_SYRINGE | INTRAMUSCULAR | Status: AC
  Filled 2015-03-20: qty 0.3

## 2015-03-20 NOTE — Progress Notes (Signed)
[name] presents today for injection per MD orders. Aranesp 60 mcg administered SQ in left Abdomen. Administration without incident. Patient tolerated well.  

## 2015-03-20 NOTE — Patient Instructions (Signed)
[place] at [facility] Discharge Instructions  RECOMMENDATIONS MADE BY THE CONSULTANT AND ANY TEST RESULTS WILL BE SENT TO YOUR REFERRING PHYSICIAN.  Hemoglobin 10.9 today. Aranesp 60 mcg injection given as ordered. Return as scheduled every 2 weeks for lab work and injection.  Thank you for choosing [place] at [facility] to provide your oncology and hematology care.  To afford each patient quality time with our provider, please arrive at least 15 minutes before your scheduled appointment time.    You need to re-schedule your appointment should you arrive 10 or more minutes late.  We strive to give you quality time with our providers, and arriving late affects you and other patients whose appointments are after yours.  Also, if you no show three or more times for appointments you may be dismissed from the clinic at the providers discretion.     Again, thank you for choosing [facility].  Our hope is that these requests will decrease the amount of time that you wait before being seen by our physicians.       _____________________________________________________________  Should you have questions after your visit to [facility], please contact our office at [phone] between the hours of 8:30 a.m. and 4:30 p.m.  Voicemails left after 4:30 p.m. will not be returned until the following business day.  For prescription refill requests, have your pharmacy contact our office.

## 2015-03-21 NOTE — Progress Notes (Signed)
Labs drawn

## 2015-03-28 ENCOUNTER — Other Ambulatory Visit: Payer: Self-pay | Admitting: Internal Medicine

## 2015-04-03 ENCOUNTER — Encounter (HOSPITAL_COMMUNITY): Payer: Commercial Managed Care - HMO

## 2015-04-03 ENCOUNTER — Encounter (HOSPITAL_COMMUNITY): Payer: Commercial Managed Care - HMO | Attending: Hematology & Oncology

## 2015-04-03 VITALS — BP 105/51 | HR 52 | Temp 98.0°F | Resp 18

## 2015-04-03 DIAGNOSIS — D631 Anemia in chronic kidney disease: Secondary | ICD-10-CM | POA: Diagnosis not present

## 2015-04-03 DIAGNOSIS — Z23 Encounter for immunization: Secondary | ICD-10-CM

## 2015-04-03 DIAGNOSIS — N189 Chronic kidney disease, unspecified: Secondary | ICD-10-CM | POA: Diagnosis present

## 2015-04-03 DIAGNOSIS — Z7901 Long term (current) use of anticoagulants: Secondary | ICD-10-CM | POA: Diagnosis not present

## 2015-04-03 DIAGNOSIS — D638 Anemia in other chronic diseases classified elsewhere: Secondary | ICD-10-CM | POA: Diagnosis not present

## 2015-04-03 DIAGNOSIS — D649 Anemia, unspecified: Secondary | ICD-10-CM

## 2015-04-03 DIAGNOSIS — N184 Chronic kidney disease, stage 4 (severe): Secondary | ICD-10-CM

## 2015-04-03 LAB — CBC WITH DIFFERENTIAL/PLATELET
Basophils Absolute: 0 10*3/uL (ref 0.0–0.1)
Basophils Relative: 0 %
EOS ABS: 0.1 10*3/uL (ref 0.0–0.7)
EOS PCT: 1 %
HCT: 32.9 % — ABNORMAL LOW (ref 36.0–46.0)
Hemoglobin: 10.6 g/dL — ABNORMAL LOW (ref 12.0–15.0)
LYMPHS ABS: 1 10*3/uL (ref 0.7–4.0)
Lymphocytes Relative: 13 %
MCH: 31 pg (ref 26.0–34.0)
MCHC: 32.2 g/dL (ref 30.0–36.0)
MCV: 96.2 fL (ref 78.0–100.0)
Monocytes Absolute: 0.6 10*3/uL (ref 0.1–1.0)
Monocytes Relative: 8 %
Neutro Abs: 6 10*3/uL (ref 1.7–7.7)
Neutrophils Relative %: 78 %
PLATELETS: 166 10*3/uL (ref 150–400)
RBC: 3.42 MIL/uL — AB (ref 3.87–5.11)
RDW: 13.5 % (ref 11.5–15.5)
WBC: 7.7 10*3/uL (ref 4.0–10.5)

## 2015-04-03 MED ORDER — INFLUENZA VAC SPLIT QUAD 0.5 ML IM SUSY
PREFILLED_SYRINGE | INTRAMUSCULAR | Status: AC
  Filled 2015-04-03: qty 0.5

## 2015-04-03 MED ORDER — DARBEPOETIN ALFA 60 MCG/0.3ML IJ SOSY
60.0000 ug | PREFILLED_SYRINGE | Freq: Once | INTRAMUSCULAR | Status: AC
  Administered 2015-04-03: 60 ug via SUBCUTANEOUS

## 2015-04-03 MED ORDER — INFLUENZA VAC SPLIT QUAD 0.5 ML IM SUSY
0.5000 mL | PREFILLED_SYRINGE | INTRAMUSCULAR | Status: AC
  Administered 2015-04-03: 0.5 mL via INTRAMUSCULAR

## 2015-04-03 MED ORDER — DARBEPOETIN ALFA 60 MCG/0.3ML IJ SOSY: 60.0000 ug | PREFILLED_SYRINGE | Freq: Once | INTRAMUSCULAR | Status: DC

## 2015-04-03 MED ORDER — DARBEPOETIN ALFA 60 MCG/0.3ML IJ SOSY
PREFILLED_SYRINGE | INTRAMUSCULAR | Status: AC
  Filled 2015-04-03: qty 0.3

## 2015-04-03 NOTE — Progress Notes (Signed)
[name] presents today for injection per MD orders. Aranesp 49mcg administered SQ in left Abdomen. Administration without incident. Patient tolerated well.  Patient also given Flu Vaccine IM in the left deltoid. Patient tolerated well.

## 2015-04-04 ENCOUNTER — Encounter: Payer: Self-pay | Admitting: *Deleted

## 2015-04-04 ENCOUNTER — Ambulatory Visit: Payer: Commercial Managed Care - HMO | Admitting: *Deleted

## 2015-04-04 NOTE — Patient Outreach (Signed)
[facility] ([facility]) Care Management  04/04/2015  [name] 11/21/1945 [id number]   Arrived at patient home, no answer to door or home phone. Call to patient spouse's cell number, spoke with patient spouse, he states that wife must of left with his daughter in law. Rescheduled appointment. Will visit next week, plan to discharge if patient agrees and goals are met. Mary E. Niemczura, RN, BSN, CCM  [facility] Community Care Manager [phone] 

## 2015-04-09 ENCOUNTER — Encounter: Payer: Self-pay | Admitting: *Deleted

## 2015-04-09 ENCOUNTER — Other Ambulatory Visit: Payer: Self-pay | Admitting: *Deleted

## 2015-04-09 NOTE — Patient Outreach (Signed)
[facility]) Care Management   04/09/2015  [name] 13-Feb-1946 [id number]  [name] is an 69 y.o. female  Subjective:  "I am so sleepy and do not know why", she is still getting Aranesp and last blood count was "10 something"  Reports no changes to medications Reports no results from sleep study Patient reports weight is up to 155# over past couple of weeks as she has been eating "good" she has cut back on her ensure supplements. Still not logging weights, BP or CBG readings. Patient asking about fluid intake  Objective:   BP 118/50 mmHg  Pulse 55  Resp 20  Wt 155 lb (70.308 kg)  SpO2 97% Review of Systems  Constitutional: Negative.   HENT: Negative.   Respiratory: Negative.   Cardiovascular: Negative.   Gastrointestinal: Negative.   Genitourinary: Negative.   Musculoskeletal: Negative.   Skin: Negative.   Neurological: Negative.   Endo/Heme/Allergies: Negative.     Physical Exam  Constitutional: She is oriented to person, place, and time.  Cardiovascular:  Murmur heard. Irregular heart rate  Respiratory: Effort normal.  GI: Soft.  Musculoskeletal: She exhibits edema.  Slight pedal edema  Neurological: She is alert and oriented to person, place, and time.  Skin: Skin is warm and dry.    Current Medications:   Current Outpatient Prescriptions  Medication Sig Dispense Refill  . amiodarone (PACERONE) 200 MG tablet TAKE 1 TABLET BY MOUTH DAILY. 90 tablet 3  . carvedilol (COREG) 3.125 MG tablet TAKE 3 TABLETS BY MOUTH TWICE DAILY WITH A MEAL. 180 tablet 3  . colchicine 0.6 MG tablet Take 0.6 mg by mouth 2 (two) times daily as needed.     . digoxin (LANOXIN) 0.125 MG tablet Take 0.5 tablets (0.0625 mg total) by mouth every other day. 30 tablet 3  . feeding supplement, ENSURE ENLIVE, (ENSURE ENLIVE) LIQD Take 237 mLs by mouth 2 (two) times daily between meals. (Patient taking differently: Take 237 mLs by mouth daily. ) 237 mL 12  . ferrous  sulfate 324 (65 FE) MG TBEC Take 1 tablet (325 mg total) by mouth 2 (two) times daily. 60 tablet   . furosemide (LASIX) 40 MG tablet Take 1 tablet (40 mg total) by mouth 2 (two) times daily. 90 tablet 3  . glimepiride (AMARYL) 2 MG tablet Take 2 mg by mouth daily.    . isosorbide mononitrate (IMDUR) 30 MG 24 hr tablet Take 1 tablet (30 mg total) by mouth daily. 30 tablet 0  . levothyroxine (SYNTHROID, LEVOTHROID) 25 MCG tablet Take 1 tablet (25 mcg total) by mouth daily before breakfast. 30 tablet 0  . lisinopril (PRINIVIL,ZESTRIL) 10 MG tablet Take 1 tablet (10 mg total) by mouth daily. 30 tablet 3  . metoCLOPramide (REGLAN) 5 MG tablet Take 5 mg by mouth 2 (two) times daily.     . ondansetron (ZOFRAN) 4 MG tablet Take 4 mg by mouth every 4 (four) hours as needed for nausea or vomiting.    Marland Kitchen oxyCODONE-acetaminophen (PERCOCET) 10-325 MG per tablet Take 1 tablet by mouth every 6 (six) hours as needed for pain.     . polyethylene glycol (MIRALAX / GLYCOLAX) packet Take 17 g by mouth daily as needed for mild constipation.    . pravastatin (PRAVACHOL) 40 MG tablet Take 80 mg by mouth at bedtime.     Marland Kitchen spironolactone (ALDACTONE) 25 MG tablet Take 0.5 tablets (12.5 mg total) by mouth daily. 45 tablet 3  . warfarin (COUMADIN)  2.5 MG tablet Take 0.5-1 tablets (1.25-2.5 mg total) by mouth daily at 6 PM. Takes 1 tablet on Mon, Wed, Fri, take 0.5 tablet on all other days     No current facility-administered medications for this visit.    Functional Status:   Fall Risk  04/09/2015 03/02/2015 01/23/2015 12/28/2014  Falls in the past year? Yes Yes Yes Yes  Number falls in past yr: '1 1 1 1  ' Injury with Fall? No No No No  Risk for fall due to : Impaired balance/gait;History of fall(s) Impaired balance/gait;History of fall(s) History of fall(s);Impaired balance/gait;Impaired mobility History of fall(s);Impaired mobility  Risk for fall due to (comments): - uses walker or can if she goes out - -  Follow up - Falls  prevention discussed;Education provided Education provided Falls evaluation completed   In your present state of health, do you have any difficulty performing the following activities: 04/09/2015 03/02/2015  Hearing? N N  Vision? N N  Difficulty concentrating or making decisions? N N  Walking or climbing stairs? N N  Dressing or bathing? N Y  Doing errands, shopping? [name]  Preparing Food and eating ? - Y  Using the Toilet? - -  In the past six months, have you accidently leaked urine? - Y  Do you have problems with loss of bowel control? - N  Managing your Medications? - Y  Managing your Finances? - N  Housekeeping or managing your Housekeeping? - Y    Fall/Depression Screening:    Fall Risk  04/09/2015 03/02/2015 01/23/2015 12/28/2014  Falls in the past year? Yes Yes Yes Yes  Number falls in past yr: '1 1 1 1  ' Injury with Fall? No No No No  Risk for fall due to : Impaired balance/gait;History of fall(s) Impaired balance/gait;History of fall(s) History of fall(s);Impaired balance/gait;Impaired mobility History of fall(s);Impaired mobility  Risk for fall due to (comments): - uses walker or can if she goes out - -  Follow up - Falls prevention discussed;Education provided Education provided Falls evaluation completed   PHQ 2/9 Scores 01/23/2015 12/28/2014  PHQ - 2 Score 0 0    Assessment:   Fall risk-reviewed safety Needs results of sleep study HF-questions about weights and fluid intake  Plan:  Instructed patient to get sleep study results from Dr. Aundra Dubin Reviewed HF zones, patient has them posted on kitchen wall Instructed to write weights and report increases and symptoms Reviewed fluid restrictions and instructed on restrictions Gave a "know before you go" guide, patient posted next to HF zones on wall in kitchen  Plan to discharge patient will send MD case closure letter Plan to close out care plan problems and discharge patient, patient agrees to discharge.  [facility] CM Care Plan  Problem One        Most Recent Value   Care Plan Problem One  Inpatient Hospitalzation for infective endocarditis   Care Plan for Problem One  Not Active   [facility] Long Term Goal (31-90 days)  Patient will not have hospitalization for infective endocarditis in the next 90 days    [facility] CM Care Plan Problem Two        Most Recent Value   Care Plan Problem Two  Patient Blood pressure running low as evidenced by VS readings   Role Documenting the Problem Two  Care Management Coordinator   Care Plan for Problem Two  Not Active   Interventions for Problem Two Long Term Goal   Using teachback encouraged patient to log  BP in log and report signs and symptoms to MD   [facility] Long Term Goal (31-90) days  Patient BP will be within normal limits over the next 90 days   [facility] Long Term Goal Start Date  03/02/15   [facility] Long Term Goal Met Date  04/09/15   [facility] CM Short Term Goal #1 (0-30 days)  Patient spouse will monitor and record BP readings over the next 30 days   [facility] CM Short Term Goal #1 Start Date  03/02/15   [facility] CM Short Term Goal #1 Met Date   04/09/15   Interventions for Short Term Goal #2   Reinstructed need to check BP daily & record,also report any low readings    [facility] CM Care Plan Problem Three        Most Recent Value   Care Plan Problem Three  High for falls as evidenced by fall last week   Role Documenting the Problem Three  Care Management Coordinator   Care Plan for Problem Three  Not Active   [facility] Long Term Goal (31-90) days  Patient will not have any falls over the next 60 days   [facility] Long Term Goal Start Date  03/02/15   Interventions for Problem Three Long Term Goal  Reviewed safety and instructed on use of walker around home and outside to increase safety      Mary E. Laymond Purser, RN, BSN, [place] [phone]

## 2015-04-16 ENCOUNTER — Telehealth (HOSPITAL_COMMUNITY): Payer: Self-pay | Admitting: *Deleted

## 2015-04-16 DIAGNOSIS — G4730 Sleep apnea, unspecified: Secondary | ICD-10-CM

## 2015-04-16 NOTE — Telephone Encounter (Signed)
-----   Message -----      From: Larey Dresser, MD     Sent: 04/11/2015  1:26 PM      To: [name] Salles, RN, Scarlette Calico, RN        Moderate to severe OSA on sleep study. Please check with patient to see if this has been addressed (is she now on CPAP?)    Received mess from Dr Aundra Dubin, pt's husband is aware of results, he states they have not been set up with CPAP, per Dr Aundra Dubin will sch pt to see Dr Radford Pax for cpap, order placed

## 2015-04-17 ENCOUNTER — Encounter (HOSPITAL_COMMUNITY): Payer: Commercial Managed Care - HMO | Attending: Hematology & Oncology

## 2015-04-17 ENCOUNTER — Encounter (HOSPITAL_BASED_OUTPATIENT_CLINIC_OR_DEPARTMENT_OTHER): Payer: Commercial Managed Care - HMO

## 2015-04-17 DIAGNOSIS — I5043 Acute on chronic combined systolic (congestive) and diastolic (congestive) heart failure: Secondary | ICD-10-CM | POA: Insufficient documentation

## 2015-04-17 DIAGNOSIS — D631 Anemia in chronic kidney disease: Secondary | ICD-10-CM | POA: Diagnosis not present

## 2015-04-17 DIAGNOSIS — D638 Anemia in other chronic diseases classified elsewhere: Secondary | ICD-10-CM

## 2015-04-17 DIAGNOSIS — N184 Chronic kidney disease, stage 4 (severe): Secondary | ICD-10-CM

## 2015-04-17 DIAGNOSIS — N189 Chronic kidney disease, unspecified: Secondary | ICD-10-CM

## 2015-04-17 LAB — CBC
HEMATOCRIT: 34.7 % — AB (ref 36.0–46.0)
Hemoglobin: 11.3 g/dL — ABNORMAL LOW (ref 12.0–15.0)
MCH: 31.9 pg (ref 26.0–34.0)
MCHC: 32.6 g/dL (ref 30.0–36.0)
MCV: 98 fL (ref 78.0–100.0)
PLATELETS: 212 10*3/uL (ref 150–400)
RBC: 3.54 MIL/uL — ABNORMAL LOW (ref 3.87–5.11)
RDW: 14 % (ref 11.5–15.5)
WBC: 6.3 10*3/uL (ref 4.0–10.5)

## 2015-04-17 LAB — FERRITIN: Ferritin: 125 ng/mL (ref 11–307)

## 2015-04-17 NOTE — Progress Notes (Signed)
Aranesp held - hemoglobin 11.3

## 2015-04-17 NOTE — Progress Notes (Signed)
..  [name]'s reason for visit today are for labs as scheduled per MD orders.  Venipuncture performed with a 23 gauge butterfly needle to [place] tolerated venipuncture well and without incident; questions were answered and patient was discharged.

## 2015-04-18 ENCOUNTER — Other Ambulatory Visit (HOSPITAL_COMMUNITY): Payer: Self-pay | Admitting: Family Medicine

## 2015-04-18 DIAGNOSIS — Z1231 Encounter for screening mammogram for malignant neoplasm of breast: Secondary | ICD-10-CM

## 2015-04-18 DIAGNOSIS — Z780 Asymptomatic menopausal state: Secondary | ICD-10-CM

## 2015-04-19 NOTE — Patient Outreach (Signed)
[facility]) Care Management  04/19/2015  [name] 1946/01/06 [id number]   Notification from Burgess Amor, RN to close patient for [place] Management.  Thanks, [name]. [place], [place] Assistant Phone: [phone] Fax: [phone]

## 2015-04-20 ENCOUNTER — Encounter: Payer: Self-pay | Admitting: Internal Medicine

## 2015-04-25 ENCOUNTER — Ambulatory Visit (HOSPITAL_COMMUNITY)
Admission: RE | Admit: 2015-04-25 | Discharge: 2015-04-25 | Disposition: A | Discharge status: Home / Self Care | Payer: Commercial Managed Care - HMO | Source: Ambulatory Visit | Attending: Family Medicine | Admitting: Family Medicine

## 2015-04-25 ENCOUNTER — Other Ambulatory Visit (HOSPITAL_COMMUNITY): Payer: Self-pay | Admitting: Family Medicine

## 2015-04-25 DIAGNOSIS — Z1231 Encounter for screening mammogram for malignant neoplasm of breast: Secondary | ICD-10-CM | POA: Diagnosis not present

## 2015-04-25 DIAGNOSIS — Z780 Asymptomatic menopausal state: Secondary | ICD-10-CM

## 2015-04-30 ENCOUNTER — Other Ambulatory Visit: Payer: Self-pay | Admitting: Internal Medicine

## 2015-05-01 ENCOUNTER — Encounter (HOSPITAL_COMMUNITY): Payer: Commercial Managed Care - HMO | Attending: Hematology & Oncology | Admitting: Oncology

## 2015-05-01 ENCOUNTER — Encounter (HOSPITAL_COMMUNITY): Payer: Commercial Managed Care - HMO

## 2015-05-01 ENCOUNTER — Encounter (HOSPITAL_COMMUNITY): Payer: Self-pay | Admitting: Oncology

## 2015-05-01 ENCOUNTER — Encounter (HOSPITAL_COMMUNITY): Payer: Commercial Managed Care - HMO | Attending: Hematology & Oncology

## 2015-05-01 VITALS — BP 108/49 | HR 50 | Temp 97.7°F | Resp 14 | Wt 159.8 lb

## 2015-05-01 DIAGNOSIS — D631 Anemia in chronic kidney disease: Secondary | ICD-10-CM | POA: Diagnosis not present

## 2015-05-01 DIAGNOSIS — D638 Anemia in other chronic diseases classified elsewhere: Secondary | ICD-10-CM

## 2015-05-01 DIAGNOSIS — Z7901 Long term (current) use of anticoagulants: Secondary | ICD-10-CM | POA: Insufficient documentation

## 2015-05-01 DIAGNOSIS — N189 Chronic kidney disease, unspecified: Secondary | ICD-10-CM | POA: Insufficient documentation

## 2015-05-01 DIAGNOSIS — D649 Anemia, unspecified: Secondary | ICD-10-CM

## 2015-05-01 DIAGNOSIS — N184 Chronic kidney disease, stage 4 (severe): Secondary | ICD-10-CM

## 2015-05-01 DIAGNOSIS — N183 Chronic kidney disease, stage 3 (moderate): Secondary | ICD-10-CM | POA: Diagnosis not present

## 2015-05-01 LAB — CBC WITH DIFFERENTIAL/PLATELET
BASOS ABS: 0 10*3/uL (ref 0.0–0.1)
BASOS PCT: 0 %
EOS ABS: 0.1 10*3/uL (ref 0.0–0.7)
Eosinophils Relative: 2 %
HEMATOCRIT: 33.5 % — AB (ref 36.0–46.0)
HEMOGLOBIN: 11 g/dL — AB (ref 12.0–15.0)
Lymphocytes Relative: 24 %
Lymphs Abs: 1.3 10*3/uL (ref 0.7–4.0)
MCH: 31.3 pg (ref 26.0–34.0)
MCHC: 32.8 g/dL (ref 30.0–36.0)
MCV: 95.4 fL (ref 78.0–100.0)
Monocytes Absolute: 0.3 10*3/uL (ref 0.1–1.0)
Monocytes Relative: 5 %
NEUTROS ABS: 3.8 10*3/uL (ref 1.7–7.7)
NEUTROS PCT: 69 %
Platelets: 177 10*3/uL (ref 150–400)
RBC: 3.51 MIL/uL — ABNORMAL LOW (ref 3.87–5.11)
RDW: 13.4 % (ref 11.5–15.5)
WBC: 5.5 10*3/uL (ref 4.0–10.5)

## 2015-05-01 MED ORDER — DARBEPOETIN ALFA 60 MCG/0.3ML IJ SOSY
60.0000 ug | PREFILLED_SYRINGE | Freq: Once | INTRAMUSCULAR | Status: AC
  Administered 2015-05-01: 60 ug via SUBCUTANEOUS
  Filled 2015-05-01: qty 0.3

## 2015-05-01 NOTE — Progress Notes (Signed)
Please see doctors encounter for more information 

## 2015-05-01 NOTE — Assessment & Plan Note (Signed)
Anemia of chronic renal disease, currently on Aranesp 60 mcg every 2 weeks beginning on 01/22/2015.  I personally reviewed and went over laboratory results with the patient.  The results are noted within this dictation.  Supportive therapy plan reviewed.  Labs today: CBC  Labs every 2 weeks: CBC  Labs every 6 weeks: Ferritin (due ~ 12/6).  Continue same dose of Aranesp 60 mcg every 2 weeks.  Return in 12 weeks for follow-up.

## 2015-05-01 NOTE — Progress Notes (Signed)
[name] presents today for injection per MD orders. Aranesp 60mcg administered SQ in right Abdomen. Administration without incident. Patient tolerated well.  

## 2015-05-01 NOTE — Progress Notes (Signed)
Robert Bellow, MD [place] [address]  Anemia of chronic disease  CURRENT THERAPY:  Aranesp 60 mcg every 2 weeks beginning on 01/22/2015 with iron support as indicated.  Oncology Flowsheet 03/09/2015 03/12/2015  ferric gluconate (NULECIT) IV 125 mg 125 mg    INTERVAL HISTORY: [name] 69 y.o. female returns for followup of anemia of chronic renal disease.  I personally reviewed and went over laboratory results with the patient.  The results are noted within this dictation.    He appetite is stable.  Her weight is stable as well.  She notes that she feels better than she has in the past.  She denies any blood in stool, black tarry stool, vaginal bleeding, hemoptysis, and hematuria.     Past Medical History  Diagnosis Date  . Cardiomyopathy, nonischemic ([place])     a. 1999 nl cath;  b. 12/05 [employer] [place];  c. 10/2005 ICD extraction 2/2 enterococcus bacteremia and Veg on RV lead;  c. 05/2008 low risk Myoview (scarring w/ some evidence of inf ischemia);  d. 11/2012 Echo: EF 15-20%;  e. 01/2013 s/p MDT [place] CRT D, ser # [id number];  f. 05/2013 Echo: EF 15%.  . Enterococcal infection     a. 10/2005 - AICD-explanted  . Pulmonary embolism ([place])     a. 07/2005 after total right hip arthroplasty  . Hilar density     a. infrahilar mass/adenopathy on CT scan 5/07; subsequently  resolved  . LBBB (left bundle branch block)   . GERD (gastroesophageal reflux disease)   . Hyperlipidemia   . Hypertension   . Tobacco abuse     a. discontinued in 1997, and then resumed  . Urinary incontinence   . Anemia     a. mild/chronic  . Villous adenoma of colon     a. tubovillous adenomatous polyp with focal high grade dysplasia; presented with hematochezia - followed by Dr. Laural Golden.  . Implantable cardioverter-defibrillator-CRT- Mdt     a.  01/2013 s/p MDT [place] CRT D, ser # [id number]  . Chronic systolic CHF (congestive heart failure) ([place])     a. 11/2012 Echo:  EF 15-20%;  b. 05/2013 TEE EF 15%.  . Pneumonia 07/2005  . Obstructive sleep apnea     a. mild-did not tolerate CPAP (01/24/2013)  . History of blood transfusion   . Degenerative joint disease     of knees, shoulder, and hips  . PAF (paroxysmal atrial fibrillation) ([place])     a. 07/2012 s/p TEE/DCCV;  b. chronic coumadin;  c. 05/2013 Recurrent Afib->TEE/DCCV and amio initiation.  . CKD (chronic kidney disease), stage III     creatinin-1.44 in 1/09; 1.51 in 1/10  . Type II diabetes mellitus ([place])     type 2    has Dyslipidemia; HTN (hypertension); LBBB; History of pulmonary embolism; Type 2 diabetes, uncontrolled, with renal manifestation ([place]); Tobacco abuse, in remission; CKD (chronic kidney disease), stage IV ([place]); Chronic combined systolic and diastolic CHF (congestive heart failure) ([place]); Chronic anticoagulation; Atrial fibrillation ([place]); Implantable cardioverter-defibrillator-CRT- Mdt; Symptomatic anemia; COPD (chronic obstructive pulmonary disease) ([place]); Anemia of chronic disease; Hypothyroidism; Fever; Acute on chronic combined systolic and diastolic CHF (congestive heart failure) (HCC); SOB (shortness of breath); Bacteremia; ICD (implantable cardioverter-defibrillator) infection ([place]); Enterococcal bacteremia; Diabetic feet ([place]); Healthcare-associated pneumonia; HCAP (healthcare-associated pneumonia); Acute respiratory failure with hypoxia ([place]); Paroxysmal atrial fibrillation ([place]); and Diabetes mellitus type 2 with complications ([place]) on her problem list.  has No Known Allergies.  Current Outpatient Prescriptions on File Prior to Visit  Medication Sig Dispense Refill  . amiodarone (PACERONE) 200 MG tablet TAKE 1 TABLET BY MOUTH DAILY. 90 tablet 3  . carvedilol (COREG) 3.125 MG tablet TAKE 3 TABLETS BY MOUTH TWICE DAILY WITH A MEAL. 180 tablet 3  . colchicine 0.6 MG tablet Take 0.6 mg by mouth 2 (two) times daily as needed.     . digoxin (LANOXIN) 0.125 MG tablet Take 0.5 tablets  (0.0625 mg total) by mouth every other day. 30 tablet 3  . feeding supplement, ENSURE ENLIVE, (ENSURE ENLIVE) LIQD Take 237 mLs by mouth 2 (two) times daily between meals. (Patient taking differently: Take 237 mLs by mouth daily. ) 237 mL 12  . ferrous sulfate 324 (65 FE) MG TBEC Take 1 tablet (325 mg total) by mouth 2 (two) times daily. 60 tablet   . furosemide (LASIX) 40 MG tablet Take 1 tablet (40 mg total) by mouth 2 (two) times daily. 90 tablet 3  . glimepiride (AMARYL) 2 MG tablet Take 2 mg by mouth daily.    . isosorbide mononitrate (IMDUR) 30 MG 24 hr tablet Take 1 tablet (30 mg total) by mouth daily. 30 tablet 0  . levothyroxine (SYNTHROID, LEVOTHROID) 25 MCG tablet Take 1 tablet (25 mcg total) by mouth daily before breakfast. 30 tablet 0  . lisinopril (PRINIVIL,ZESTRIL) 10 MG tablet Take 1 tablet (10 mg total) by mouth daily. 30 tablet 3  . metoCLOPramide (REGLAN) 5 MG tablet Take 5 mg by mouth 2 (two) times daily.     Marland Kitchen oxyCODONE-acetaminophen (PERCOCET) 10-325 MG per tablet Take 1 tablet by mouth every 6 (six) hours as needed for pain.     . polyethylene glycol (MIRALAX / GLYCOLAX) packet Take 17 g by mouth daily as needed for mild constipation.    . pravastatin (PRAVACHOL) 40 MG tablet Take 80 mg by mouth at bedtime.     Marland Kitchen spironolactone (ALDACTONE) 25 MG tablet Take 0.5 tablets (12.5 mg total) by mouth daily. 45 tablet 3  . warfarin (COUMADIN) 2.5 MG tablet Take 0.5-1 tablets (1.25-2.5 mg total) by mouth daily at 6 PM. Takes 1 tablet on Mon, Wed, Fri, take 0.5 tablet on all other days    . ondansetron (ZOFRAN) 4 MG tablet Take 4 mg by mouth every 4 (four) hours as needed for nausea or vomiting.     No current facility-administered medications on file prior to visit.    Past Surgical History  Procedure Laterality Date  . Pacemaker removal  11/18/05    Enterococcal infection  . Total hip arthroplasty Right 07/2005  . Knee arthroscopy Right 1980's?  . Colonoscopy w/ polypectomy   2009  . Cardioversion N/A 08/20/2012    Procedure: TEE GUIDED CARDIOVERSION;  Surgeon: Yehuda Savannah, MD;  Location: [facility];  Service: Cardiovascular;  Laterality: N/A;  To be done @ bedside  . Tee without cardioversion N/A 08/20/2012    Procedure: TRANSESOPHAGEAL ECHOCARDIOGRAM (TEE);  Surgeon: Yehuda Savannah, MD;  Location: [facility];  Service: Cardiovascular;  Laterality: N/A;  . Bi-ventricular implantable cardioverter defibrillator  (crt-d)  01/24/2013  . A-v cardiac pacemaker insertion  12/05    Biventricular pacemaker/AICD  . Abdominal hysterectomy  1990/92    Initial partial hysterectomy followed by BSO  . Tubal ligation  1980's  . Cardiac catheterization    . Colonoscopy with esophagogastroduodenoscopy (egd) N/A 06/10/2013    Procedure: COLONOSCOPY WITH ESOPHAGOGASTRODUODENOSCOPY (EGD);  Surgeon: Rogene Houston,  MD;  Location: [facility];  Service: Endoscopy;  Laterality: N/A;  925  . Tee without cardioversion N/A 06/20/2013    Procedure: TRANSESOPHAGEAL ECHOCARDIOGRAM (TEE);  Surgeon: Dorothy Spark, MD;  Location: [place];  Service: Cardiovascular;  Laterality: N/A;  . Cardioversion N/A 06/20/2013    Procedure: CARDIOVERSION;  Surgeon: Dorothy Spark, MD;  Location: [facility] ENDOSCOPY;  Service: Cardiovascular;  Laterality: N/A;  . Bi-ventricular implantable cardioverter defibrillator N/A 01/24/2013    Procedure: BI-VENTRICULAR IMPLANTABLE CARDIOVERTER DEFIBRILLATOR  (CRT-D);  Surgeon: Evans Lance, MD;  Location: [facility] CATH LAB;  Service: Cardiovascular;  Laterality: N/A;  . Right heart catheterization N/A 04/18/2014    Procedure: RIGHT HEART CATH;  Surgeon: Larey Dresser, MD;  Location: [facility] CATH LAB;  Service: Cardiovascular;  Laterality: N/A;  . Esophagogastroduodenoscopy N/A 10/21/2014    Procedure: ESOPHAGOGASTRODUODENOSCOPY (EGD);  Surgeon: Inda Castle, MD;  Location: [place];  Service: Endoscopy;  Laterality: N/A;  . Colonoscopy Left 10/24/2014    Procedure:  COLONOSCOPY;  Surgeon: Carol Ada, MD;  Location: [facility] ENDOSCOPY;  Service: Endoscopy;  Laterality: Left;  Freda Munro capsule study N/A 10/24/2014    Procedure: GIVENS CAPSULE STUDY;  Surgeon: Carol Ada, MD;  Location: [facility] ENDOSCOPY;  Service: Endoscopy;  Laterality: N/A;  . Tee without cardioversion N/A 12/08/2014    Procedure: TRANSESOPHAGEAL ECHOCARDIOGRAM (TEE);  Surgeon: Fay Records, MD;  [facility];  Service: Cardiovascular;  Laterality: N/A;  . Icd lead removal N/A 12/13/2014    Procedure: ICD LEAD REMOVAL/EXTRACTION ;  Surgeon: Evans Lance, MD;  Location: [facility] OR;  Service: Cardiovascular;  Laterality: N/A;  [place] back up    Denies any headaches, dizziness, double vision, fevers, chills, night sweats, nausea, vomiting, diarrhea, constipation, chest pain, heart palpitations, shortness of breath, blood in stool, black tarry stool, urinary pain, urinary burning, urinary frequency, hematuria.   PHYSICAL EXAMINATION  ECOG PERFORMANCE STATUS: 1 - Symptomatic but completely ambulatory  Filed Vitals:   05/01/15 1304  BP: 108/49  Pulse: 50  Temp: 97.7 F (36.5 C)  Resp: 14    GENERAL:alert, no distress, well nourished, well developed, comfortable, cooperative, smiling and chronically ill appearing. SKIN: skin color, texture, turgor are normal, no rashes or significant lesions, SK on forehead and lesion on bridge of nose that we will need to follow. HEAD: Normocephalic, No masses, lesions, tenderness or abnormalities EYES: normal, PERRLA, EOMI, Conjunctiva are pink and non-injected EARS: External ears normal OROPHARYNX:lips, buccal mucosa, and tongue normal and mucous membranes are moist  NECK: supple, no adenopathy, thyroid normal size, non-tender, without nodularity, trachea midline LYMPH:  no palpable lymphadenopathy BREAST:not examined LUNGS: clear to auscultation and percussion HEART: regular rate & rhythm, no murmurs, no gallops, S1 normal and S2  normal ABDOMEN:abdomen soft, non-tender and no masses or organomegaly BACK: Back symmetric, no curvature., No CVA tenderness EXTREMITIES:less then 2 second capillary refill, no joint deformities, effusion, or inflammation, no skin discoloration, no cyanosis  NEURO: alert & oriented x 3 with fluent speech, no focal motor/sensory deficits   LABORATORY DATA: CBC    Component Value Date/Time   WBC 6.3 04/17/2015 1153   RBC 3.54* 04/17/2015 1153   RBC 2.52* 10/18/2014 0850   HGB 11.3* 04/17/2015 1153   HCT 34.7* 04/17/2015 1153   PLT 212 04/17/2015 1153   MCV 98.0 04/17/2015 1153   MCH 31.9 04/17/2015 1153   MCHC 32.6 04/17/2015 1153   RDW 14.0 04/17/2015 1153   LYMPHSABS 1.0 04/03/2015 1040  MONOABS 0.6 04/03/2015 1040   EOSABS 0.1 04/03/2015 1040   BASOSABS 0.0 04/03/2015 1040      Chemistry      Component Value Date/Time   NA 141 02/06/2015 1224   K 4.8 02/06/2015 1224   CL 105 02/06/2015 1224   CO2 28 02/06/2015 1224   BUN 53* 02/06/2015 1224   CREATININE 2.26* 02/06/2015 1224   CREATININE 1.17* 01/20/2013 1010      Component Value Date/Time   CALCIUM 9.6 02/06/2015 1224   ALKPHOS 66 02/06/2015 1224   AST 18 02/06/2015 1224   ALT 14 02/06/2015 1224   BILITOT 0.9 02/06/2015 1224      Lab Results  Component Value Date   FERRITIN 125 04/17/2015     PENDING LABS:   RADIOGRAPHIC STUDIES:  Dg Bone Density  04/25/2015  EXAM: DUAL X-RAY ABSORPTIOMETRY (DXA) FOR BONE MINERAL DENSITY IMPRESSION: Ordering Physician:  Dr. Lemmie Evens, Your patient Dorlene Footman completed a BMD test on 04/25/2015 using the [place] (software version: 14.10) manufactured by [employer]. The following summarizes the results of our evaluation. PATIENT BIOGRAPHICAL: Name: Nivia, Gervase Patient ID: [id number] Birth Date: 02/01/1946 Height: 65.0 in. Gender: Female Exam Date: 04/25/2015 Weight: 156.0 lbs. Indications: Bilateral Oophrectomy, Caucasian, Low Calcium  Intake, Post Menopausal, Vitamin D Deficiency Fractures: Treatments: Vitamin D DENSITOMETRY RESULTS: Site         Region        Measured Date Measured Age WHO Classification Young Adult T-score BMD         %Change vs. Previous Significant Change (*) [facility] Spine L1-L4 (L2,L3) 04/25/2015 71.3 Osteopenia -1.8 0.954 g/cm2 Left Forearm Radius 33% 04/25/2015 71.3 Osteoporosis -3.4 0.468 g/cm2 ASSESSMENT: BMD as determined from Forearm Radius 33% is 0.468 g/cm2 with a T-Score of -3.4. This patient is considered osteoporotic according to [facility]) criteria. (L-2-3 was excluded due to advanced degenerative changes.) (Patient does not meet criteria for FRAX assessment.) World Health Organization [facility]) criteria for post-menopausal, Caucasian Women: Normal:       T-score at or above -1 SD Osteopenia:   T-score between -1 and -2.5 SD Osteoporosis: T-score at or below -2.5 SD RECOMMENDATIONS: [place] recommends that FDA-approved medial therapies be considered in postmenopausal women and men age 42 or older with a: 1. Hip or vertebral (clinical or morphometric) fracture. 2. T-Score of < -2.5 at the spine or hip. 3. Ten-year fracture probability by FRAX of 3% or greater for hip fracture or 20% or greater for major osteoporotic fracture. All treatment decisions require clinical judgment and consideration of individual patient factors, including patient preferences, co-morbidities, previous drug use, risk factors not captured in the FRAX model (e.g. falls, vitamin D deficiency, increased bone turnover, interval significant decline in bone density) and possible under-or over-estimation of fracture risk by FRAX. All patients should ensure an adequate intake of dietary calcium (1200 mg/d) and vitamin D (800 IU daily) unless contraindicated. FOLLOW-UP: People with diagnosed cases of osteoporosis or osteopenia should be regularly tested for bone mineral density. For patients eligible for  Medicare, routine testing is allowed once every 2 years. Testing frequency can be increased for patients who have rapidly progressing disease, or for those who are receiving medical therapy to restore bone mass. I have reviewed this report, and agree with the above findings. [facility] Electronically Signed   By: David  Martinique M.D.   On: 04/25/2015 08:54     PATHOLOGY:    ASSESSMENT  AND PLAN:  Anemia of chronic disease Anemia of chronic renal disease, currently on Aranesp 60 mcg every 2 weeks beginning on 01/22/2015.  I personally reviewed and went over laboratory results with the patient.  The results are noted within this dictation.  Supportive therapy plan reviewed.  Labs today: CBC  Labs every 2 weeks: CBC  Labs every 6 weeks: Ferritin (due ~ 12/6).  Continue same dose of Aranesp 60 mcg every 2 weeks.  Return in 12 weeks for follow-up.   THERAPY PLAN:  Continue with ESA therapy and we will adjust the dose according to lab results.  Additionally, we will monitor iron stores and replaced as needed. If she goes 2 months without needing ESA, will space out frequency to every 4 weeks.  All questions were answered. The patient knows to call the clinic with any problems, questions or concerns. We can certainly see the patient much sooner if necessary.  Patient and plan discussed with Dr. Ancil Linsey and she is in agreement with the aforementioned.   This note is electronically signed by: Robynn Pane, PA-C 05/01/2015 1:50 PM

## 2015-05-01 NOTE — Patient Instructions (Addendum)
[place] at [facility] Discharge Instructions  RECOMMENDATIONS MADE BY THE CONSULTANT AND ANY TEST RESULTS WILL BE SENT TO YOUR REFERRING PHYSICIAN.  Exam and discussion by Robynn Pane, PA-C Will give aranesp today Will continue current therapy Report increased shortness of breath or fatigue Labs and injection every 2 weeks Office visit in 3 months.  Thank you for choosing [place] at [facility] to provide your oncology and hematology care.  To afford each patient quality time with our provider, please arrive at least 15 minutes before your scheduled appointment time.    You need to re-schedule your appointment should you arrive 10 or more minutes late.  We strive to give you quality time with our providers, and arriving late affects you and other patients whose appointments are after yours.  Also, if you no show three or more times for appointments you may be dismissed from the clinic at the providers discretion.     Again, thank you for choosing [facility].  Our hope is that these requests will decrease the amount of time that you wait before being seen by our physicians.       _____________________________________________________________  Should you have questions after your visit to [facility], please contact our office at [phone] between the hours of 8:30 a.m. and 4:30 p.m.  Voicemails left after 4:30 p.m. will not be returned until the following business day.  For prescription refill requests, have your pharmacy contact our office.

## 2015-05-04 ENCOUNTER — Telehealth: Payer: Self-pay | Admitting: *Deleted

## 2015-05-04 DIAGNOSIS — G4733 Obstructive sleep apnea (adult) (pediatric): Secondary | ICD-10-CM

## 2015-05-04 NOTE — Telephone Encounter (Signed)
Spoke with patient's husband to let him know that orders are being placed and [facility] will be contacting them to set up CPAP.  Once patient is on CPAP, we will schedule 10 week office visit with [name].

## 2015-05-10 ENCOUNTER — Other Ambulatory Visit (HOSPITAL_COMMUNITY): Payer: Self-pay

## 2015-05-15 ENCOUNTER — Encounter (HOSPITAL_BASED_OUTPATIENT_CLINIC_OR_DEPARTMENT_OTHER): Payer: Commercial Managed Care - HMO

## 2015-05-15 ENCOUNTER — Encounter (HOSPITAL_COMMUNITY): Payer: Commercial Managed Care - HMO

## 2015-05-15 DIAGNOSIS — N189 Chronic kidney disease, unspecified: Secondary | ICD-10-CM | POA: Diagnosis not present

## 2015-05-15 DIAGNOSIS — D638 Anemia in other chronic diseases classified elsewhere: Secondary | ICD-10-CM | POA: Diagnosis not present

## 2015-05-15 LAB — CBC WITH DIFFERENTIAL/PLATELET
Basophils Absolute: 0 10*3/uL (ref 0.0–0.1)
Basophils Relative: 0 %
EOS ABS: 0 10*3/uL (ref 0.0–0.7)
EOS PCT: 1 %
HCT: 35.5 % — ABNORMAL LOW (ref 36.0–46.0)
Hemoglobin: 11.7 g/dL — ABNORMAL LOW (ref 12.0–15.0)
LYMPHS ABS: 1.2 10*3/uL (ref 0.7–4.0)
LYMPHS PCT: 20 %
MCH: 31.6 pg (ref 26.0–34.0)
MCHC: 33 g/dL (ref 30.0–36.0)
MCV: 95.9 fL (ref 78.0–100.0)
MONO ABS: 0.6 10*3/uL (ref 0.1–1.0)
MONOS PCT: 9 %
Neutro Abs: 4.3 10*3/uL (ref 1.7–7.7)
Neutrophils Relative %: 70 %
PLATELETS: 191 10*3/uL (ref 150–400)
RBC: 3.7 MIL/uL — ABNORMAL LOW (ref 3.87–5.11)
RDW: 13.9 % (ref 11.5–15.5)
WBC: 6 10*3/uL (ref 4.0–10.5)

## 2015-05-15 NOTE — Progress Notes (Signed)
Labs drawn

## 2015-05-15 NOTE — Progress Notes (Signed)
Patient didn't receive injection today due to lab results and treatment parameters.  HGB: 11.7.  Patient was given lab results and schedule for return appointments.  Patient and daughter verbalized understanding.

## 2015-05-28 ENCOUNTER — Other Ambulatory Visit: Payer: Self-pay | Admitting: Internal Medicine

## 2015-05-28 ENCOUNTER — Other Ambulatory Visit (HOSPITAL_COMMUNITY): Payer: Self-pay | Admitting: Cardiology

## 2015-05-29 ENCOUNTER — Encounter (HOSPITAL_COMMUNITY): Payer: Commercial Managed Care - HMO

## 2015-05-29 ENCOUNTER — Encounter (HOSPITAL_COMMUNITY): Payer: Commercial Managed Care - HMO | Attending: Hematology & Oncology

## 2015-05-29 ENCOUNTER — Other Ambulatory Visit: Payer: Self-pay | Admitting: Internal Medicine

## 2015-05-29 DIAGNOSIS — N189 Chronic kidney disease, unspecified: Secondary | ICD-10-CM | POA: Diagnosis present

## 2015-05-29 DIAGNOSIS — D638 Anemia in other chronic diseases classified elsewhere: Secondary | ICD-10-CM

## 2015-05-29 DIAGNOSIS — Z7901 Long term (current) use of anticoagulants: Secondary | ICD-10-CM | POA: Diagnosis not present

## 2015-05-29 DIAGNOSIS — D631 Anemia in chronic kidney disease: Secondary | ICD-10-CM | POA: Diagnosis not present

## 2015-05-29 DIAGNOSIS — I5043 Acute on chronic combined systolic (congestive) and diastolic (congestive) heart failure: Secondary | ICD-10-CM

## 2015-05-29 DIAGNOSIS — N184 Chronic kidney disease, stage 4 (severe): Secondary | ICD-10-CM

## 2015-05-29 LAB — CBC WITH DIFFERENTIAL/PLATELET
BASOS ABS: 0 10*3/uL (ref 0.0–0.1)
Basophils Relative: 0 %
Eosinophils Absolute: 0.1 10*3/uL (ref 0.0–0.7)
Eosinophils Relative: 2 %
HEMATOCRIT: 35.1 % — AB (ref 36.0–46.0)
HEMOGLOBIN: 11.5 g/dL — AB (ref 12.0–15.0)
LYMPHS ABS: 1.3 10*3/uL (ref 0.7–4.0)
LYMPHS PCT: 18 %
MCH: 31.4 pg (ref 26.0–34.0)
MCHC: 32.8 g/dL (ref 30.0–36.0)
MCV: 95.9 fL (ref 78.0–100.0)
Monocytes Absolute: 0.3 10*3/uL (ref 0.1–1.0)
Monocytes Relative: 4 %
NEUTROS ABS: 5.4 10*3/uL (ref 1.7–7.7)
Neutrophils Relative %: 76 %
Platelets: 227 10*3/uL (ref 150–400)
RBC: 3.66 MIL/uL — AB (ref 3.87–5.11)
RDW: 13.6 % (ref 11.5–15.5)
WBC: 7.1 10*3/uL (ref 4.0–10.5)

## 2015-05-29 LAB — FERRITIN: FERRITIN: 169 ng/mL (ref 11–307)

## 2015-05-29 NOTE — Progress Notes (Signed)
Patient didn't receive injection today r/t lab values.  Patient aware of results and return appointment.

## 2015-05-31 ENCOUNTER — Other Ambulatory Visit (HOSPITAL_COMMUNITY): Payer: Self-pay

## 2015-05-31 DIAGNOSIS — D638 Anemia in other chronic diseases classified elsewhere: Secondary | ICD-10-CM

## 2015-06-12 ENCOUNTER — Encounter (HOSPITAL_BASED_OUTPATIENT_CLINIC_OR_DEPARTMENT_OTHER): Payer: Commercial Managed Care - HMO

## 2015-06-12 ENCOUNTER — Encounter (HOSPITAL_COMMUNITY): Payer: Commercial Managed Care - HMO

## 2015-06-12 DIAGNOSIS — D638 Anemia in other chronic diseases classified elsewhere: Secondary | ICD-10-CM

## 2015-06-12 DIAGNOSIS — N189 Chronic kidney disease, unspecified: Secondary | ICD-10-CM | POA: Diagnosis not present

## 2015-06-12 LAB — CBC WITH DIFFERENTIAL/PLATELET
Basophils Absolute: 0 10*3/uL (ref 0.0–0.1)
Basophils Relative: 0 %
EOS ABS: 0.1 10*3/uL (ref 0.0–0.7)
EOS PCT: 2 %
HEMATOCRIT: 34.6 % — AB (ref 36.0–46.0)
HEMOGLOBIN: 11.5 g/dL — AB (ref 12.0–15.0)
LYMPHS ABS: 1.1 10*3/uL (ref 0.7–4.0)
Lymphocytes Relative: 17 %
MCH: 31.9 pg (ref 26.0–34.0)
MCHC: 33.2 g/dL (ref 30.0–36.0)
MCV: 96.1 fL (ref 78.0–100.0)
MONOS PCT: 6 %
Monocytes Absolute: 0.4 10*3/uL (ref 0.1–1.0)
NEUTROS PCT: 75 %
Neutro Abs: 4.6 10*3/uL (ref 1.7–7.7)
Platelets: 191 10*3/uL (ref 150–400)
RBC: 3.6 MIL/uL — ABNORMAL LOW (ref 3.87–5.11)
RDW: 13.4 % (ref 11.5–15.5)
WBC: 6.1 10*3/uL (ref 4.0–10.5)

## 2015-06-12 NOTE — Progress Notes (Signed)
[name] presented for labwork. Labs per MD order drawn via Peripheral Line 23 gauge needle inserted in left AC  Good blood return present. Procedure without incident.  Needle removed intact. Patient tolerated procedure well.

## 2015-06-12 NOTE — Progress Notes (Signed)
Hemoglobin 11.5 today. Aranesp not given, treatment parameters not met. Discussed previous results between 05/01/15 and today with Dr.Penland. Patient last received aranesp on 11/8 and hgb has been greater than 11 since that time. Per Dr.Penland decrease frequency to every 4 weeks and based on next's visits labs she will determine future appointments. Discussed with patient and husband. Verbalized understanding.

## 2015-06-22 ENCOUNTER — Telehealth (HOSPITAL_COMMUNITY): Payer: Self-pay | Admitting: *Deleted

## 2015-06-22 NOTE — Telephone Encounter (Signed)
Pt husband called to let us know that the pt hasn't been wearing her CPAP at night due to the pressure being to high. Wanted to change the settings on the machine.  Pt was suppose to set up an appt to follow up with Dr Radford Pax but hasn't seen her yet nor is an appointment set up.  [facility] ordered the sleep study. Will get appt made for her to see Dr Radford Pax and try and get the setting changed.  [facility] needs an RX for the changes.

## 2015-06-26 ENCOUNTER — Other Ambulatory Visit (HOSPITAL_COMMUNITY): Payer: Commercial Managed Care - HMO

## 2015-06-26 ENCOUNTER — Encounter (HOSPITAL_COMMUNITY): Payer: Commercial Managed Care - HMO

## 2015-07-10 ENCOUNTER — Encounter (HOSPITAL_COMMUNITY): Payer: Commercial Managed Care - HMO | Attending: Hematology & Oncology

## 2015-07-10 VITALS — BP 125/56 | HR 42 | Temp 98.0°F | Resp 16

## 2015-07-10 DIAGNOSIS — D631 Anemia in chronic kidney disease: Secondary | ICD-10-CM

## 2015-07-10 DIAGNOSIS — N184 Chronic kidney disease, stage 4 (severe): Secondary | ICD-10-CM

## 2015-07-10 DIAGNOSIS — D638 Anemia in other chronic diseases classified elsewhere: Secondary | ICD-10-CM

## 2015-07-10 DIAGNOSIS — I5043 Acute on chronic combined systolic (congestive) and diastolic (congestive) heart failure: Secondary | ICD-10-CM

## 2015-07-10 DIAGNOSIS — N189 Chronic kidney disease, unspecified: Secondary | ICD-10-CM | POA: Insufficient documentation

## 2015-07-10 DIAGNOSIS — N183 Chronic kidney disease, stage 3 (moderate): Secondary | ICD-10-CM | POA: Diagnosis not present

## 2015-07-10 DIAGNOSIS — D649 Anemia, unspecified: Secondary | ICD-10-CM

## 2015-07-10 DIAGNOSIS — Z7901 Long term (current) use of anticoagulants: Secondary | ICD-10-CM | POA: Diagnosis not present

## 2015-07-10 LAB — CBC WITH DIFFERENTIAL/PLATELET
BASOS PCT: 0 %
Basophils Absolute: 0 10*3/uL (ref 0.0–0.1)
EOS ABS: 0.1 10*3/uL (ref 0.0–0.7)
Eosinophils Relative: 2 %
HEMATOCRIT: 32.5 % — AB (ref 36.0–46.0)
Hemoglobin: 10.9 g/dL — ABNORMAL LOW (ref 12.0–15.0)
LYMPHS ABS: 1.4 10*3/uL (ref 0.7–4.0)
Lymphocytes Relative: 19 %
MCH: 32.4 pg (ref 26.0–34.0)
MCHC: 33.5 g/dL (ref 30.0–36.0)
MCV: 96.7 fL (ref 78.0–100.0)
MONOS PCT: 6 %
Monocytes Absolute: 0.4 10*3/uL (ref 0.1–1.0)
Neutro Abs: 5.4 10*3/uL (ref 1.7–7.7)
Neutrophils Relative %: 73 %
Platelets: 178 10*3/uL (ref 150–400)
RBC: 3.36 MIL/uL — ABNORMAL LOW (ref 3.87–5.11)
RDW: 13.2 % (ref 11.5–15.5)
WBC: 7.4 10*3/uL (ref 4.0–10.5)

## 2015-07-10 LAB — FERRITIN: Ferritin: 214 ng/mL (ref 11–307)

## 2015-07-10 MED ORDER — DARBEPOETIN ALFA 60 MCG/0.3ML IJ SOSY
60.0000 ug | PREFILLED_SYRINGE | Freq: Once | INTRAMUSCULAR | Status: AC
  Administered 2015-07-10: 60 ug via SUBCUTANEOUS
  Filled 2015-07-10: qty 0.3

## 2015-07-10 NOTE — Progress Notes (Signed)
Sabrea R Weisberg presents today for injection per MD orders. Aranesp 60mcg administered SQ in right Abdomen. Administration without incident. Patient tolerated well.  

## 2015-07-10 NOTE — Patient Instructions (Signed)
[facility] at [facility] Discharge Instructions  RECOMMENDATIONS MADE BY THE CONSULTANT AND ANY TEST RESULTS WILL BE SENT TO YOUR REFERRING PHYSICIAN.  Aranesp today.   Return as scheduled.    Thank you for choosing [facility] at [facility] to provide your oncology and hematology care.  To afford each patient quality time with our provider, please arrive at least 15 minutes before your scheduled appointment time.    You need to re-schedule your appointment should you arrive 10 or more minutes late.  We strive to give you quality time with our providers, and arriving late affects you and other patients whose appointments are after yours.  Also, if you no show three or more times for appointments you may be dismissed from the clinic at the providers discretion.     Again, thank you for choosing [facility].  Our hope is that these requests will decrease the amount of time that you wait before being seen by our physicians.       _____________________________________________________________  Should you have questions after your visit to [facility], please contact our office at [phone] between the hours of 8:30 a.m. and 4:30 p.m.  Voicemails left after 4:30 p.m. will not be returned until the following business day.  For prescription refill requests, have your pharmacy contact our office.     

## 2015-07-22 NOTE — Assessment & Plan Note (Addendum)
Anemia of chronic renal disease, currently on Aranesp 60 mcg every 2 weeks beginning on 01/22/2015.  Additionally, she requires IV iron support intermittently.  Oncology Flowsheet 03/20/2015 04/03/2015 05/01/2015 07/10/2015  Darbepoetin Alfa (ARANESP) [place] 60 mcg 60 mcg 60 mcg 60 mcg   Oncology Flowsheet 03/09/2015 03/12/2015  ferric gluconate (NULECIT) IV 125 mg 125 mg   I personally reviewed and went over laboratory results with the patient.  The results are noted within this dictation.  Supportive therapy plan reviewed.  Labs today: CBC  Given her response to ESA therapy, she is interested in spacing out her appointments for Aranesp.  I will change her supportive therapy plan to every 3 weeks and maintain the same dose of Aranesp.  Labs every 3 weeks: CBC  Labs every 6 weeks: Ferritin.  This is due with her next blood draw.  Continue same dose of Aranesp 60 mcg every 3 weeks.  Return in 12 weeks for follow-up.

## 2015-07-22 NOTE — Progress Notes (Signed)
Robert Bellow, MD [place] [address]  Anemia of chronic disease  CURRENT THERAPY:  Aranesp 60 mcg every 2 weeks beginning on 01/22/2015 with iron support as indicated.  Changing to every 3 weeks moving forward.  Oncology Flowsheet 03/20/2015 04/03/2015 05/01/2015 07/10/2015  Darbepoetin Alfa (ARANESP) [place] 60 mcg 60 mcg 60 mcg 60 mcg    Oncology Flowsheet 03/09/2015 03/12/2015  ferric gluconate (NULECIT) IV 125 mg 125 mg    INTERVAL HISTORY: [name] 70 y.o. female returns for followup of anemia of chronic renal disease, on ESA therapy, previously on 60 mcg every 2 weeks with Aranesp, but changing to every 3 weeks moving forward given her excellent response to therapy in addition to iron support as indicated.  I personally reviewed and went over laboratory results with the patient.  The results are noted within this dictation.    Her HGB has responded extraordinarily well.  She has only needed Aranesp 3 times over the past 3 months.    He appetite is stable.  Her weight is stable as well.  She notes that she feels better than she has in the past.  She denies any blood in stool, black tarry stool, vaginal bleeding, hemoptysis, and hematuria.     Past Medical History  Diagnosis Date  . Cardiomyopathy, nonischemic ([place])     a. 1999 nl cath;  b. 12/05 [facility] [place];  c. 10/2005 ICD extraction 2/2 enterococcus bacteremia and Veg on RV lead;  c. 05/2008 low risk Myoview (scarring w/ some evidence of inf ischemia);  d. 11/2012 Echo: EF 15-20%;  e. 01/2013 s/p MDT [place] CRT D, ser # [id number];  f. 05/2013 Echo: EF 15%.  . Enterococcal infection     a. 10/2005 - AICD-explanted  . Pulmonary embolism ([place])     a. 07/2005 after total right hip arthroplasty  . Hilar density     a. infrahilar mass/adenopathy on CT scan 5/07; subsequently  resolved  . LBBB (left bundle branch block)   . GERD (gastroesophageal reflux disease)   . Hyperlipidemia   .  Hypertension   . Tobacco abuse     a. discontinued in 1997, and then resumed  . Urinary incontinence   . Anemia     a. mild/chronic  . Villous adenoma of colon     a. tubovillous adenomatous polyp with focal high grade dysplasia; presented with hematochezia - followed by Dr. Laural Golden.  . Implantable cardioverter-defibrillator-CRT- Mdt     a.  01/2013 s/p MDT [place] CRT D, ser # [id number]  . Chronic systolic CHF (congestive heart failure) ([place])     a. 11/2012 Echo: EF 15-20%;  b. 05/2013 TEE EF 15%.  . Pneumonia 07/2005  . Obstructive sleep apnea     a. mild-did not tolerate CPAP (01/24/2013)  . History of blood transfusion   . Degenerative joint disease     of knees, shoulder, and hips  . PAF (paroxysmal atrial fibrillation) ([place])     a. 07/2012 s/p TEE/DCCV;  b. chronic coumadin;  c. 05/2013 Recurrent Afib->TEE/DCCV and amio initiation.  . CKD (chronic kidney disease), stage III     creatinin-1.44 in 1/09; 1.51 in 1/10  . Type II diabetes mellitus ([place])     type 2    has Dyslipidemia; HTN (hypertension); LBBB; History of pulmonary embolism; Type 2 diabetes, uncontrolled, with renal manifestation ([place]); Tobacco abuse, in remission; CKD (chronic kidney disease), stage IV ([place]); Chronic combined  systolic and diastolic CHF (congestive heart failure) ([place]); Chronic anticoagulation; Atrial fibrillation ([place]); Implantable cardioverter-defibrillator-CRT- Mdt; Symptomatic anemia; COPD (chronic obstructive pulmonary disease) ([place]); Anemia of chronic disease; Hypothyroidism; Fever; Acute on chronic combined systolic and diastolic CHF (congestive heart failure) ([facility]); SOB (shortness of breath); Bacteremia; ICD (implantable cardioverter-defibrillator) infection ([place]); Enterococcal bacteremia; Diabetic feet ([place]); Healthcare-associated pneumonia; HCAP (healthcare-associated pneumonia); Acute respiratory failure with hypoxia ([place]); Paroxysmal atrial fibrillation ([place]); and Diabetes mellitus type 2 with  complications ([place]) on her problem list.     has No Known Allergies.  Current Outpatient Prescriptions on File Prior to Visit  Medication Sig Dispense Refill  . amiodarone (PACERONE) 200 MG tablet TAKE 1 TABLET BY MOUTH DAILY. 90 tablet 3  . carvedilol (COREG) 3.125 MG tablet TAKE 3 TABLETS BY MOUTH TWICE DAILY WITH A MEAL. 180 tablet 3  . digoxin (LANOXIN) 0.125 MG tablet Take 0.5 tablets (0.0625 mg total) by mouth every other day. 30 tablet 3  . feeding supplement, ENSURE ENLIVE, (ENSURE ENLIVE) LIQD Take 237 mLs by mouth 2 (two) times daily between meals. (Patient taking differently: Take 237 mLs by mouth daily. ) 237 mL 12  . ferrous sulfate 324 (65 FE) MG TBEC Take 1 tablet (325 mg total) by mouth 2 (two) times daily. 60 tablet   . furosemide (LASIX) 40 MG tablet Take 1 tablet (40 mg total) by mouth 2 (two) times daily. 90 tablet 3  . glimepiride (AMARYL) 2 MG tablet Take 2 mg by mouth daily.    . isosorbide mononitrate (IMDUR) 30 MG 24 hr tablet Take 1 tablet (30 mg total) by mouth daily. 30 tablet 0  . levothyroxine (SYNTHROID, LEVOTHROID) 25 MCG tablet Take 1 tablet (25 mcg total) by mouth daily before breakfast. 30 tablet 0  . lisinopril (PRINIVIL,ZESTRIL) 10 MG tablet TAKE ONE TABLET BY MOUTH DAILY. 30 tablet 3  . metoCLOPramide (REGLAN) 5 MG tablet Take 5 mg by mouth 2 (two) times daily.     . ondansetron (ZOFRAN) 4 MG tablet Take 4 mg by mouth every 4 (four) hours as needed for nausea or vomiting.    Marland Kitchen oxyCODONE-acetaminophen (PERCOCET) 10-325 MG per tablet Take 1 tablet by mouth every 6 (six) hours as needed for pain.     . polyethylene glycol (MIRALAX / GLYCOLAX) packet Take 17 g by mouth daily as needed for mild constipation.    . pravastatin (PRAVACHOL) 40 MG tablet Take 80 mg by mouth at bedtime.     Marland Kitchen spironolactone (ALDACTONE) 25 MG tablet Take 0.5 tablets (12.5 mg total) by mouth daily. 45 tablet 3  . colchicine 0.6 MG tablet Take 0.6 mg by mouth 2 (two) times daily as  needed. Reported on 07/24/2015    . warfarin (COUMADIN) 2.5 MG tablet Take 0.5-1 tablets (1.25-2.5 mg total) by mouth daily at 6 PM. Takes 1 tablet on Mon, Wed, Fri, take 0.5 tablet on all other days     No current facility-administered medications on file prior to visit.    Past Surgical History  Procedure Laterality Date  . Pacemaker removal  11/18/05    Enterococcal infection  . Total hip arthroplasty Right 07/2005  . Knee arthroscopy Right 1980's?  . Colonoscopy w/ polypectomy  2009  . Cardioversion N/A 08/20/2012    Procedure: TEE GUIDED CARDIOVERSION;  Surgeon: Yehuda Savannah, MD;  Location: [facility];  Service: Cardiovascular;  Laterality: N/A;  To be done @ bedside  . Tee without cardioversion N/A 08/20/2012    Procedure: TRANSESOPHAGEAL ECHOCARDIOGRAM (TEE);  Surgeon: Yehuda Savannah, MD;  Location: [facility];  Service: Cardiovascular;  Laterality: N/A;  . Bi-ventricular implantable cardioverter defibrillator  (crt-d)  01/24/2013  . A-v cardiac pacemaker insertion  12/05    Biventricular pacemaker/AICD  . Abdominal hysterectomy  1990/92    Initial partial hysterectomy followed by BSO  . Tubal ligation  1980's  . Cardiac catheterization    . Colonoscopy with esophagogastroduodenoscopy (egd) N/A 06/10/2013    Procedure: COLONOSCOPY WITH ESOPHAGOGASTRODUODENOSCOPY (EGD);  Surgeon: Rogene Houston, MD;  Location: [facility];  Service: Endoscopy;  Laterality: N/A;  925  . Tee without cardioversion N/A 06/20/2013    Procedure: TRANSESOPHAGEAL ECHOCARDIOGRAM (TEE);  Surgeon: Dorothy Spark, MD;  Location: [facility];  Service: Cardiovascular;  Laterality: N/A;  . Cardioversion N/A 06/20/2013    Procedure: CARDIOVERSION;  Surgeon: Dorothy Spark, MD;  Location[facility] END[facility]COPY;  Service: Cardiovascular;  Laterality: N/A;  . Bi-ventricular implantable cardioverter defibrillator N/A 01/24/2013    Procedure: BI-VENTRICULAR IMPLANTABLE CARDIOVERTER DEFIBRILLATOR  (CRT-D);  Surgeon: Evans Lance, MD;  Location[facility] CATH LAB;  Service: Cardiovascular;  Laterality: N/A;  . Right heart catheterization N/A 04/18/2014    Procedure: RIGHT HEART CATH;  Surgeon: Larey Dresser, MD;  Location: [facility] CATH LAB;  Service: Cardiovascular;  Laterality: N/A;  . Esophagogastroduodenoscopy N/A 10/21/2014    Procedure: ESOPHAGOGASTRODUODENOSCOPY (EGD);  Surgeon: Inda Castle, MD;  Location: [place] [place];  Service: Endoscopy;  Laterality: N/A;  . Colonoscopy Left 10/24/2014    Procedure: COLONOSCOPY;  Surgeon: Carol Ada, MD;  Location: [facility] ENDOSCOPY;  Service: Endoscopy;  Laterality: Left;  Freda Munro capsule study N/A 10/24/2014    Procedure: GIVENS CAPSULE STUDY;  Surgeon: Carol Ada, MD;  Location: [facility] ENDOSCOPY;  Service: Endoscopy;  Laterality: N/A;  . Tee without cardioversion N/A 12/08/2014    Procedure: TRANSESOPHAGEAL ECHOCARDIOGRAM (TEE);  Surgeon: Fay Records, MD;  Location: [facility];  Service: Cardiovascular;  Laterality: N/A;  . Icd lead removal N/A 12/13/2014    Procedure: ICD LEAD REMOVAL/EXTRACTION ;  Surgeon: Evans Lance, MD;  Location: [facility] OR;  Service: Cardiovascular;  Laterality: N/A;  [place] back up    Denies any headaches, dizziness, double vision, fevers, chills, night sweats, nausea, vomiting, diarrhea, constipation, chest pain, heart palpitations, shortness of breath, blood in stool, black tarry stool, urinary pain, urinary burning, urinary frequency, hematuria.   PHYSICAL EXAMINATION  ECOG PERFORMANCE STATUS: 1 - Symptomatic but completely ambulatory  Filed Vitals:   07/24/15 1130  BP: 141/60  Pulse: 53  Temp: 98.4 F (36.9 C)  Resp: 20    GENERAL:alert, no distress, well nourished, well developed, comfortable, cooperative, smiling and chronically ill appearing. SKIN: skin color, texture, turgor are normal, no rashes or significant lesions, SK on forehead and lesion on bridge of nose that we will need to follow. HEAD: Normocephalic, No masses, lesions,  tenderness or abnormalities EYES: normal, PERRLA, EOMI, Conjunctiva are pink and non-injected EARS: External ears normal OROPHARYNX:lips, buccal mucosa, and tongue normal and mucous membranes are moist  NECK: supple, no adenopathy, thyroid normal size, non-tender, without nodularity, trachea midline LYMPH:  no palpable lymphadenopathy BREAST:not examined LUNGS: clear to auscultation and percussion HEART: regular rate & rhythm, no murmurs, no gallops, S1 normal and S2 normal ABDOMEN:abdomen soft, non-tender and no masses or organomegaly BACK: Back symmetric, no curvature., No CVA tenderness EXTREMITIES:less then 2 second capillary refill, no joint deformities, effusion, or inflammation, no skin discoloration, no cyanosis  NEURO: alert & oriented x 3  with fluent speech, no focal motor/sensory deficits   LABORATORY DATA: CBC    Component Value Date/Time   WBC 5.7 07/24/2015 1053   RBC 3.45* 07/24/2015 1053   RBC 2.52* 10/18/2014 0850   HGB 11.3* 07/24/2015 1053   HCT 33.8* 07/24/2015 1053   PLT 187 07/24/2015 1053   MCV 98.0 07/24/2015 1053   MCH 32.8 07/24/2015 1053   MCHC 33.4 07/24/2015 1053   RDW 13.5 07/24/2015 1053   LYMPHSABS 1.2 07/24/2015 1053   MONOABS 0.4 07/24/2015 1053   EOSABS 0.1 07/24/2015 1053   BASOSABS 0.0 07/24/2015 1053      Chemistry      Component Value Date/Time   NA 141 02/06/2015 1224   K 4.8 02/06/2015 1224   CL 105 02/06/2015 1224   CO2 28 02/06/2015 1224   BUN 53* 02/06/2015 1224   CREATININE 2.26* 02/06/2015 1224   CREATININE 1.17* 01/20/2013 1010      Component Value Date/Time   CALCIUM 9.6 02/06/2015 1224   ALKPHOS 66 02/06/2015 1224   AST 18 02/06/2015 1224   ALT 14 02/06/2015 1224   BILITOT 0.9 02/06/2015 1224      Lab Results  Component Value Date   FERRITIN 214 07/10/2015     PENDING LABS:   RADIOGRAPHIC STUDIES:  No results found.   PATHOLOGY:    ASSESSMENT AND PLAN:  Anemia of chronic disease Anemia of  chronic renal disease, currently on Aranesp 60 mcg every 2 weeks beginning on 01/22/2015.  Additionally, she requires IV iron support intermittently.  Oncology Flowsheet 03/20/2015 04/03/2015 05/01/2015 07/10/2015  Darbepoetin Alfa (ARANESP) [place] 60 mcg 60 mcg 60 mcg 60 mcg   Oncology Flowsheet 03/09/2015 03/12/2015  ferric gluconate (NULECIT) IV 125 mg 125 mg   I personally reviewed and went over laboratory results with the patient.  The results are noted within this dictation.  Supportive therapy plan reviewed.  Labs today: CBC  Given her response to ESA therapy, she is interested in spacing out her appointments for Aranesp.  I will change her supportive therapy plan to every 3 weeks and maintain the same dose of Aranesp.  Labs every 3 weeks: CBC  Labs every 6 weeks: Ferritin.  This is due with her next blood draw.  Continue same dose of Aranesp 60 mcg every 3 weeks.  Return in 12 weeks for follow-up.   THERAPY PLAN:  Will adjust Aranesp plan to 60 mcg every 3 weeks (instead of every 2 weeks) at the patient's request given her excellent HGB response and only needing 3 injections from October- Jan 2017.  If continued maintenance of HGB is noted, will need to consider further decrease in frequency of Aranesp (ie every 4 weeks).  All questions were answered. The patient knows to call the clinic with any problems, questions or concerns. We can certainly see the patient much sooner if necessary.  Patient and plan discussed with Dr. Ancil Linsey and she is in agreement with the aforementioned.   This note is electronically signed by: Doy Mince 07/24/2015 12:14 PM

## 2015-07-24 ENCOUNTER — Encounter (HOSPITAL_BASED_OUTPATIENT_CLINIC_OR_DEPARTMENT_OTHER): Payer: Commercial Managed Care - HMO | Admitting: Oncology

## 2015-07-24 ENCOUNTER — Encounter (HOSPITAL_COMMUNITY): Payer: Commercial Managed Care - HMO

## 2015-07-24 VITALS — BP 141/60 | HR 53 | Temp 98.4°F | Resp 20 | Wt 162.3 lb

## 2015-07-24 DIAGNOSIS — D638 Anemia in other chronic diseases classified elsewhere: Secondary | ICD-10-CM

## 2015-07-24 DIAGNOSIS — N189 Chronic kidney disease, unspecified: Secondary | ICD-10-CM | POA: Diagnosis not present

## 2015-07-24 LAB — CBC WITH DIFFERENTIAL/PLATELET
BASOS ABS: 0 10*3/uL (ref 0.0–0.1)
Basophils Relative: 1 %
Eosinophils Absolute: 0.1 10*3/uL (ref 0.0–0.7)
Eosinophils Relative: 2 %
HEMATOCRIT: 33.8 % — AB (ref 36.0–46.0)
Hemoglobin: 11.3 g/dL — ABNORMAL LOW (ref 12.0–15.0)
LYMPHS PCT: 21 %
Lymphs Abs: 1.2 10*3/uL (ref 0.7–4.0)
MCH: 32.8 pg (ref 26.0–34.0)
MCHC: 33.4 g/dL (ref 30.0–36.0)
MCV: 98 fL (ref 78.0–100.0)
Monocytes Absolute: 0.4 10*3/uL (ref 0.1–1.0)
Monocytes Relative: 7 %
NEUTROS ABS: 4 10*3/uL (ref 1.7–7.7)
NEUTROS PCT: 69 %
PLATELETS: 187 10*3/uL (ref 150–400)
RBC: 3.45 MIL/uL — AB (ref 3.87–5.11)
RDW: 13.5 % (ref 11.5–15.5)
WBC: 5.7 10*3/uL (ref 4.0–10.5)

## 2015-07-24 NOTE — Patient Instructions (Addendum)
[place] at [facility] Discharge Instructions  RECOMMENDATIONS MADE BY THE CONSULTANT AND ANY TEST RESULTS WILL BE SENT TO YOUR REFERRING PHYSICIAN.  Exam and discussion today with Kirby Crigler, PA-C. Lab work and Aranesp injections changed to every 3 weeks. Office visit in 12 weeks.   Thank you for choosing [place] at [facility] to provide your oncology and hematology care.  To afford each patient quality time with our provider, please arrive at least 15 minutes before your scheduled appointment time.   Beginning January 23rd 2017 lab work for the [facility] will be done in the  Main lab at [facility] on 1st floor. If you have a lab appointment with the [facility] please come in thru the  Main Entrance and check in at the main information desk  You need to re-schedule your appointment should you arrive 10 or more minutes late.  We strive to give you quality time with our providers, and arriving late affects you and other patients whose appointments are after yours.  Also, if you no show three or more times for appointments you may be dismissed from the clinic at the providers discretion.     Again, thank you for choosing [facility].  Our hope is that these requests will decrease the amount of time that you wait before being seen by our physicians.       _____________________________________________________________  Should you have questions after your visit to [facility], please contact our office at [phone] between the hours of 8:30 a.m. and 4:30 p.m.  Voicemails left after 4:30 p.m. will not be returned until the following business day.  For prescription refill requests, have your pharmacy contact our office.

## 2015-07-24 NOTE — Progress Notes (Signed)
..  [name] seen by [facility] today. No aranesp today due to hgb greater than 11

## 2015-07-27 ENCOUNTER — Other Ambulatory Visit: Payer: Self-pay | Admitting: Internal Medicine

## 2015-07-30 ENCOUNTER — Encounter (HOSPITAL_COMMUNITY)
Admission: RE | Admit: 2015-07-30 | Discharge: 2015-07-30 | Disposition: A | Discharge status: Home / Self Care | Payer: Commercial Managed Care - HMO | Source: Ambulatory Visit | Attending: Ophthalmology | Admitting: Ophthalmology

## 2015-07-30 ENCOUNTER — Encounter (HOSPITAL_COMMUNITY): Payer: Self-pay

## 2015-07-30 DIAGNOSIS — H269 Unspecified cataract: Secondary | ICD-10-CM | POA: Diagnosis not present

## 2015-07-30 DIAGNOSIS — Z01812 Encounter for preprocedural laboratory examination: Secondary | ICD-10-CM | POA: Insufficient documentation

## 2015-07-30 HISTORY — DX: Infection and inflammatory reaction due to other cardiac and vascular devices, implants and grafts, initial encounter: T82.7XXA

## 2015-07-30 LAB — BASIC METABOLIC PANEL
ANION GAP: 10 (ref 5–15)
BUN: 60 mg/dL — ABNORMAL HIGH (ref 6–20)
CALCIUM: 9.4 mg/dL (ref 8.9–10.3)
CHLORIDE: 104 mmol/L (ref 101–111)
CO2: 24 mmol/L (ref 22–32)
Creatinine, Ser: 2.56 mg/dL — ABNORMAL HIGH (ref 0.44–1.00)
GFR calc non Af Amer: 18 mL/min — ABNORMAL LOW (ref >60)
GFR, EST AFRICAN AMERICAN: 21 mL/min — AB (ref >60)
GLUCOSE: 273 mg/dL — AB (ref 65–99)
POTASSIUM: 4.7 mmol/L (ref 3.5–5.1)
Sodium: 138 mmol/L (ref 135–145)

## 2015-07-30 NOTE — Patient Instructions (Signed)
Your procedure is scheduled on:   08/07/2015              Report to [facility] at  7:00   AM.  Call this number if you have problems the morning of surgery: [phone]   Remember:   Do not eat or drink :After Midnight.    Take these medicines the morning of surgery with A SIP OF WATER:  Amiodarone, Coreg, Digoxin, Synthroid, and lisinopril          Do not wear jewelry, make-up or nail polish.  Do not wear lotions, powders, or perfumes. You may wear deodorant.  Do not shave 48 hours prior to surgery.  Do not bring valuables to the hospital.  Contacts, dentures or bridgework may not be worn into surgery.  Patients discharged the day of surgery will not be allowed to drive home.  Name and phone number of your driver:    @[email or web address] @ Cataract Surgery  A cataract is a clouding of the lens of the eye. When a lens becomes cloudy, vision is reduced based on the degree and nature of the clouding. Surgery may be needed to improve vision. Surgery removes the cloudy lens and usually replaces it with a substitute lens (intraocular lens, IOL). LET YOUR EYE DOCTOR KNOW ABOUT:  Allergies to food or medicine.   Medicines taken including herbs, eyedrops, over-the-counter medicines, and creams.   Use of steroids (by mouth or creams).   Previous problems with anesthetics or numbing medicine.   History of bleeding problems or blood clots.   Previous surgery.   Other health problems, including diabetes and kidney problems.   Possibility of pregnancy, if this applies.  RISKS AND COMPLICATIONS  Infection.   Inflammation of the eyeball (endophthalmitis) that can spread to both eyes (sympathetic ophthalmia).   Poor wound healing.   If an IOL is inserted, it can later fall out of proper position. This is very uncommon.   Clouding of the part of your eye that holds an IOL in place. This is called an "after-cataract." These are uncommon, but easily treated.  BEFORE THE PROCEDURE  Do not  eat or drink anything except small amounts of water for 8 to 12 before your surgery, or as directed by your caregiver.   Unless you are told otherwise, continue any eyedrops you have been prescribed.   Talk to your primary caregiver about all other medicines that you take (both prescription and non-prescription). In some cases, you may need to stop or change medicines near the time of your surgery. This is most important if you are taking blood-thinning medicine.Do not stop medicines unless you are told to do so.   Arrange for someone to drive you to and from the procedure.   Do not put contact lenses in either eye on the day of your surgery.  PROCEDURE There is more than one method for safely removing a cataract. Your doctor can explain the differences and help determine which is best for you. Phacoemulsification surgery is the most common form of cataract surgery.  An injection is given behind the eye or eyedrops are given to make this a painless procedure.   A small cut (incision) is made on the edge of the clear, dome-shaped surface that covers the front of the eye (cornea).   A tiny probe is painlessly inserted into the eye. This device gives off ultrasound waves that soften and break up the cloudy center of the lens. This makes it easier for the  cloudy lens to be removed by suction.   An IOL may be implanted.   The normal lens of the eye is covered by a clear capsule. Part of that capsule is intentionally left in the eye to support the IOL.   Your surgeon may or may not use stitches to close the incision.  There are other forms of cataract surgery that require a larger incision and stiches to close the eye. This approach is taken in cases where the doctor feels that the cataract cannot be easily removed using phacoemulsification. AFTER THE PROCEDURE  When an IOL is implanted, it does not need care. It becomes a permanent part of your eye and cannot be seen or felt.   Your doctor  will schedule follow-up exams to check on your progress.   Review your other medicines with your doctor to see which can be resumed after surgery.   Use eyedrops or take medicine as prescribed by your doctor.  Document Released: 05/29/2011 Document Reviewed: 05/26/2011 [facility] Patient Information 2012 [place].  .Cataract Surgery Care After Refer to this sheet in the next few weeks. These instructions provide you with information on caring for yourself after your procedure. Your caregiver may also give you more specific instructions. Your treatment has been planned according to current medical practices, but problems sometimes occur. Call your caregiver if you have any problems or questions after your procedure.  HOME CARE INSTRUCTIONS   Avoid strenuous activities as directed by your caregiver.   Ask your caregiver when you can resume driving.   Use eyedrops or other medicines to help healing and control pressure inside your eye as directed by your caregiver.   Only take over-the-counter or prescription medicines for pain, discomfort, or fever as directed by your caregiver.   Do not to touch or rub your eyes.   You may be instructed to use a protective shield during the first few days and nights after surgery. If not, wear sunglasses to protect your eyes. This is to protect the eye from pressure or from being accidentally bumped.   Keep the area around your eye clean and dry. Avoid swimming or allowing water to hit you directly in the face while showering. Keep soap and shampoo out of your eyes.   Do not bend or lift heavy objects. Bending increases pressure in the eye. You can walk, climb stairs, and do light household chores.   Do not put a contact lens into the eye that had surgery until your caregiver says it is okay to do so.   Ask your doctor when you can return to work. This will depend on the kind of work that you do. If you work in a dusty environment, you may be  advised to wear protective eyewear for a period of time.   Ask your caregiver when it will be safe to engage in sexual activity.   Continue with your regular eye exams as directed by your caregiver.  What to expect:  It is normal to feel itching and mild discomfort for a few days after cataract surgery. Some fluid discharge is also common, and your eye may be sensitive to light and touch.   After 1 to 2 days, even moderate discomfort should disappear. In most cases, healing will take about 6 weeks.   If you received an intraocular lens (IOL), you may notice that colors are very bright or have a blue tinge. Also, if you have been in bright sunlight, everything may appear reddish  for a few hours. If you see these color tinges, it is because your lens is clear and no longer cloudy. Within a few months after receiving an IOL, these extra colors should go away. When you have healed, you will probably need new glasses.  SEEK MEDICAL CARE IF:   You have increased bruising around your eye.   You have discomfort not helped by medicine.  SEEK IMMEDIATE MEDICAL CARE IF:   You have a fever.   You have a worsening or sudden vision loss.   You have redness, swelling, or increasing pain in the eye.   You have a thick discharge from the eye that had surgery.  MAKE SURE YOU:  Understand these instructions.   Will watch your condition.   Will get help right away if you are not doing well or get worse.  Document Released: 12/27/2004 Document Revised: 05/29/2011 Document Reviewed: 01/31/2011 [facility] Patient Information 2012 [place].    Monitored Anesthesia Care  Monitored anesthesia care is an anesthesia service for a medical procedure. Anesthesia is the loss of the ability to feel pain. It is produced by medications called anesthetics. It may affect a small area of your body (local anesthesia), a large area of your body (regional anesthesia), or your entire body (general anesthesia). The  need for monitored anesthesia care depends your procedure, your condition, and the potential need for regional or general anesthesia. It is often provided during procedures where:   General anesthesia may be needed if there are complications. This is because you need special care when you are under general anesthesia.   You will be under local or regional anesthesia. This is so that you are able to have higher levels of anesthesia if needed.   You will receive calming medications (sedatives). This is especially the case if sedatives are given to put you in a semi-conscious state of relaxation (deep sedation). This is because the amount of sedative needed to produce this state can be hard to predict. Too much of a sedative can produce general anesthesia. Monitored anesthesia care is performed by one or more caregivers who have special training in all types of anesthesia. You will need to meet with these caregivers before your procedure. During this meeting, they will ask you about your medical history. They will also give you instructions to follow. (For example, you will need to stop eating and drinking before your procedure. You may also need to stop or change medications you are taking.) During your procedure, your caregivers will stay with you. They will:   Watch your condition. This includes watching you blood pressure, breathing, and level of pain.   Diagnose and treat problems that occur.   Give medications if they are needed. These may include calming medications (sedatives) and anesthetics.   Make sure you are comfortable.  Having monitored anesthesia care does not necessarily mean that you will be under anesthesia. It does mean that your caregivers will be able to manage anesthesia if you need it or if it occurs. It also means that you will be able to have a different type of anesthesia than you are having if you need it. When your procedure is complete, your caregivers will continue  to watch your condition. They will make sure any medications wear off before you are allowed to go home.  Document Released: 03/05/2005 Document Revised: 10/04/2012 Document Reviewed: 07/21/2012 [facility] Patient Information 2014 [place], [place].

## 2015-07-31 ENCOUNTER — Encounter (HOSPITAL_COMMUNITY): Admission: RE | Admit: 2015-07-31 | Payer: Commercial Managed Care - HMO | Source: Ambulatory Visit

## 2015-08-06 MED ORDER — KETOROLAC TROMETHAMINE 0.5 % OP SOLN
OPHTHALMIC | Status: AC
  Filled 2015-08-06: qty 5

## 2015-08-06 MED ORDER — PHENYLEPHRINE HCL 2.5 % OP SOLN
OPHTHALMIC | Status: AC
  Filled 2015-08-06: qty 15

## 2015-08-06 MED ORDER — CYCLOPENTOLATE-PHENYLEPHRINE OP SOLN OPTIME - NO CHARGE
OPHTHALMIC | Status: AC
  Filled 2015-08-06: qty 2

## 2015-08-06 MED ORDER — TETRACAINE HCL 0.5 % OP SOLN
OPHTHALMIC | Status: AC
  Filled 2015-08-06: qty 4

## 2015-08-07 ENCOUNTER — Encounter (HOSPITAL_COMMUNITY): Payer: Self-pay | Admitting: *Deleted

## 2015-08-07 ENCOUNTER — Ambulatory Visit (HOSPITAL_COMMUNITY)
Admission: RE | Admit: 2015-08-07 | Discharge: 2015-08-07 | Disposition: A | Discharge status: Home / Self Care | Payer: Commercial Managed Care - HMO | Source: Ambulatory Visit | Attending: Ophthalmology | Admitting: Ophthalmology

## 2015-08-07 ENCOUNTER — Encounter (HOSPITAL_COMMUNITY)
Admission: RE | Disposition: A | Discharge status: Home / Self Care | Payer: Self-pay | Source: Ambulatory Visit | Attending: Ophthalmology

## 2015-08-07 ENCOUNTER — Ambulatory Visit (HOSPITAL_COMMUNITY): Payer: Commercial Managed Care - HMO | Admitting: Anesthesiology

## 2015-08-07 ENCOUNTER — Ambulatory Visit (HOSPITAL_COMMUNITY)
Admission: RE | Admit: 2015-08-07 | Payer: Commercial Managed Care - HMO | Source: Ambulatory Visit | Admitting: Ophthalmology

## 2015-08-07 DIAGNOSIS — Z79899 Other long term (current) drug therapy: Secondary | ICD-10-CM | POA: Diagnosis not present

## 2015-08-07 DIAGNOSIS — E119 Type 2 diabetes mellitus without complications: Secondary | ICD-10-CM | POA: Insufficient documentation

## 2015-08-07 DIAGNOSIS — M1991 Primary osteoarthritis, unspecified site: Secondary | ICD-10-CM | POA: Diagnosis not present

## 2015-08-07 DIAGNOSIS — Z7901 Long term (current) use of anticoagulants: Secondary | ICD-10-CM | POA: Insufficient documentation

## 2015-08-07 DIAGNOSIS — Z87891 Personal history of nicotine dependence: Secondary | ICD-10-CM | POA: Diagnosis not present

## 2015-08-07 DIAGNOSIS — I110 Hypertensive heart disease with heart failure: Secondary | ICD-10-CM | POA: Diagnosis not present

## 2015-08-07 DIAGNOSIS — I4891 Unspecified atrial fibrillation: Secondary | ICD-10-CM | POA: Diagnosis not present

## 2015-08-07 DIAGNOSIS — I509 Heart failure, unspecified: Secondary | ICD-10-CM | POA: Insufficient documentation

## 2015-08-07 DIAGNOSIS — K219 Gastro-esophageal reflux disease without esophagitis: Secondary | ICD-10-CM | POA: Diagnosis not present

## 2015-08-07 DIAGNOSIS — H268 Other specified cataract: Secondary | ICD-10-CM | POA: Diagnosis not present

## 2015-08-07 HISTORY — PX: CATARACT EXTRACTION W/PHACO: SHX586

## 2015-08-07 LAB — GLUCOSE, CAPILLARY: Glucose-Capillary: 198 mg/dL — ABNORMAL HIGH (ref 65–99)

## 2015-08-07 SURGERY — PHACOEMULSIFICATION, CATARACT, WITH IOL INSERTION
Anesthesia: Monitor Anesthesia Care | Site: Eye | Laterality: Left

## 2015-08-07 MED ORDER — FENTANYL CITRATE (PF) 100 MCG/2ML IJ SOLN
INTRAMUSCULAR | Status: AC
  Filled 2015-08-07: qty 2

## 2015-08-07 MED ORDER — PHENYLEPHRINE HCL 2.5 % OP SOLN
1.0000 [drp] | OPHTHALMIC | Status: AC
  Administered 2015-08-07 (×3): 1 [drp] via OPHTHALMIC

## 2015-08-07 MED ORDER — LIDOCAINE HCL (PF) 1 % IJ SOLN
INTRAMUSCULAR | Status: AC
  Filled 2015-08-07: qty 2

## 2015-08-07 MED ORDER — BSS IO SOLN
INTRAOCULAR | Status: DC | PRN
  Administered 2015-08-07: 15 mL

## 2015-08-07 MED ORDER — EPINEPHRINE HCL 1 MG/ML IJ SOLN
INTRAMUSCULAR | Status: AC
  Filled 2015-08-07: qty 1

## 2015-08-07 MED ORDER — LACTATED RINGERS IV SOLN
INTRAVENOUS | Status: DC
  Administered 2015-08-07: 08:00:00 via INTRAVENOUS

## 2015-08-07 MED ORDER — EPINEPHRINE HCL 1 MG/ML IJ SOLN
INTRAOCULAR | Status: DC | PRN
  Administered 2015-08-07: 500 mL

## 2015-08-07 MED ORDER — TETRACAINE 0.5 % OP SOLN OPTIME - NO CHARGE
OPHTHALMIC | Status: DC | PRN
  Administered 2015-08-07: 2 [drp] via OPHTHALMIC

## 2015-08-07 MED ORDER — MIDAZOLAM HCL 2 MG/2ML IJ SOLN
INTRAMUSCULAR | Status: AC
  Filled 2015-08-07: qty 2

## 2015-08-07 MED ORDER — FENTANYL CITRATE (PF) 100 MCG/2ML IJ SOLN
25.0000 ug | INTRAMUSCULAR | Status: AC
  Administered 2015-08-07: 25 ug via INTRAVENOUS

## 2015-08-07 MED ORDER — TETRACAINE HCL 0.5 % OP SOLN
1.0000 [drp] | OPHTHALMIC | Status: AC
  Administered 2015-08-07 (×3): 1 [drp] via OPHTHALMIC

## 2015-08-07 MED ORDER — PROVISC 10 MG/ML IO SOLN
INTRAOCULAR | Status: DC | PRN
  Administered 2015-08-07: 0.85 mL via INTRAOCULAR

## 2015-08-07 MED ORDER — CYCLOPENTOLATE-PHENYLEPHRINE 0.2-1 % OP SOLN
1.0000 [drp] | OPHTHALMIC | Status: AC
  Administered 2015-08-07 (×3): 1 [drp] via OPHTHALMIC

## 2015-08-07 MED ORDER — KETOROLAC TROMETHAMINE 0.5 % OP SOLN
1.0000 [drp] | OPHTHALMIC | Status: AC
  Administered 2015-08-07 (×3): 1 [drp] via OPHTHALMIC

## 2015-08-07 MED ORDER — MIDAZOLAM HCL 2 MG/2ML IJ SOLN
1.0000 mg | INTRAMUSCULAR | Status: DC | PRN
Start: 2015-08-07 — End: 2015-08-07
  Administered 2015-08-07: 2 mg via INTRAVENOUS

## 2015-08-07 SURGICAL SUPPLY — 10 items
CLOTH BEACON ORANGE TIMEOUT ST (SAFETY) ×3 IMPLANT
EYE SHIELD UNIVERSAL CLEAR (GAUZE/BANDAGES/DRESSINGS) ×3 IMPLANT
GLOVE BIOGEL PI IND STRL 7.0 (GLOVE) ×1 IMPLANT
GLOVE BIOGEL PI INDICATOR 7.0 (GLOVE) ×2
GLOVE EXAM NITRILE MD LF STRL (GLOVE) ×3 IMPLANT
LENS ALC ACRYL/TECN (Ophthalmic Related) ×3 IMPLANT
PAD ARMBOARD 7.5X6 YLW CONV (MISCELLANEOUS) ×3 IMPLANT
TAPE SURG TRANSPORE 1 IN (GAUZE/BANDAGES/DRESSINGS) ×1 IMPLANT
TAPE SURGICAL TRANSPORE 1 IN (GAUZE/BANDAGES/DRESSINGS) ×2
WATER STERILE IRR 250ML POUR (IV SOLUTION) ×3 IMPLANT

## 2015-08-07 NOTE — Anesthesia Postprocedure Evaluation (Signed)
Anesthesia Post Note  Patient: [name]  Procedure(s) Performed: Procedure(s) (LRB): CATARACT EXTRACTION PHACO AND INTRAOCULAR LENS PLACEMENT (IOC) (Left)  Patient location during evaluation: Short Stay Anesthesia Type: MAC Level of consciousness: awake and alert, oriented and patient cooperative Pain management: pain level controlled Vital Signs Assessment: post-procedure vital signs reviewed and stable Respiratory status: spontaneous breathing Cardiovascular status: blood pressure returned to baseline, stable and bradycardic Postop Assessment: no signs of nausea or vomiting and adequate PO intake Anesthetic complications: no    Last Vitals:  Filed Vitals:   08/07/15 0812  BP: 141/67  Pulse: 49  Temp: 37 C    Last Pain: There were no vitals filed for this visit.               Akiva Josey J

## 2015-08-07 NOTE — Transfer of Care (Signed)
Immediate Anesthesia Transfer of Care Note  Patient: [name]  Procedure(s) Performed: Procedure(s) with comments: CATARACT EXTRACTION PHACO AND INTRAOCULAR LENS PLACEMENT (IOC) (Left) - CDE: 7.88  Patient Location: Short Stay  Anesthesia Type:MAC  Level of Consciousness: awake, alert , oriented and patient cooperative  Airway & Oxygen Therapy: Patient Spontanous Breathing  Post-op Assessment: Report given to RN, Post -op Vital signs reviewed and stable and Patient moving all extremities  Post vital signs: Reviewed and stable  Last Vitals:  Filed Vitals:   08/07/15 0812  BP: 141/67  Pulse: 49  Temp: 37 C    Complications: No apparent anesthesia complications

## 2015-08-07 NOTE — Discharge Instructions (Signed)
°  °          [facility] Instructions °[address]- [place] [address]-[place] °    ° °1. Avoid closing eyes tightly. One often closes the eye tightly when laughing, talking, sneezing, coughing or if they feel irritated. At these times, you should be careful not to close your eyes tightly. ° °2. Instill eye drops as instructed. To instill drops in your eye, open it, look up and have someone gently pull the lower lid down and instill a couple of drops inside the lower lid. ° °3. Do not touch upper lid. ° °4. Take Advil or Tylenol for pain. ° °5. You may use either eye for near work, such as reading or sewing and you may watch television. ° °6. You may have your hair done at the beauty parlor at any time. ° °7. Wear dark glasses with or without your own glasses if you are in bright light. ° °8. Call our office at [phone] or [phone] if you have sharp pain in your eye or unusual symptoms. ° °9.  FOLLOW UP WITH DR. SHAPIRO TODAY IN HIS [place] OFFICE AT 2:45pm. ° °  °I have received a copy of the above instructions and will follow them.  ° ° ° °IF YOU ARE IN IMMEDIATE DANGER CALL 911! ° °It is important for you to keep your follow-up appointment with your physician after discharge, OR, for you /your caregiver to make a follow-up appointment with your physician / medical provider after discharge. ° °Show these instructions to the next healthcare provider you see. °PATIENT INSTRUCTIONS °POST-ANESTHESIA ° °IMMEDIATELY FOLLOWING SURGERY:  Do not drive or operate machinery for the first twenty four hours after surgery.  Do not make any important decisions for twenty four hours after surgery or while taking narcotic pain medications or sedatives.  If you develop intractable nausea and vomiting or a severe headache please notify your doctor immediately. ° °FOLLOW-UP:  Please make an appointment with your surgeon as instructed. You do not need to follow up with anesthesia unless  specifically instructed to do so. ° °WOUND CARE INSTRUCTIONS (if applicable):  Keep a dry clean dressing on the anesthesia/puncture wound site if there is drainage.  Once the wound has quit draining you may leave it open to air.  Generally you should leave the bandage intact for twenty four hours unless there is drainage.  If the epidural site drains for more than 36-48 hours please call the anesthesia department. ° °QUESTIONS?:  Please feel free to call your physician or the hospital operator if you have any questions, and they will be happy to assist you.    ° ° ° °

## 2015-08-07 NOTE — Op Note (Signed)
Patient brought to the operating room and prepped and draped in the usual manner.  Lid speculum inserted in left eye.  Stab incision made at the twelve o'clock position.  Provisc instilled in the anterior chamber.   A 2.4 mm. Stab incision was made temporally.  An anterior capsulotomy was done with a bent 25 gauge needle.  The nucleus was hydrodissected.  The Phaco tip was inserted in the anterior chamber and the nucleus was emulsified.  CDE was 7.88.  The cortical material was then removed with the I and A tip.  Posterior capsule was the polished.  The anterior chamber was deepened with Provisc.  A 20.5 Diopter Alcon SN60WF IOL was then inserted in the capsular bag.  Provisc was then removed with the I and A tip.  The wound was then hydrated.  Patient sent to the Recovery Room in good condition with follow up in my office.  Preoperative Diagnosis:  Nuclear Cataract OS Postoperative Diagnosis:  Same Procedure name: Kelman Phacoemulsification OS with IOL

## 2015-08-07 NOTE — Anesthesia Preprocedure Evaluation (Signed)
Anesthesia Evaluation  Patient identified by MRN, date of birth, ID band Patient awake    Reviewed: Allergy & Precautions, H&P , NPO status , Patient's Chart, lab work & pertinent test results, reviewed documented beta blocker date and time   History of Anesthesia Complications Negative for: history of anesthetic complications  Airway Mallampati: I  TM Distance: >3 FB     Dental  (+) Edentulous Upper, Edentulous Lower   Pulmonary sleep apnea , pneumonia, former smoker,    breath sounds clear to auscultation       Cardiovascular hypertension, Pt. on medications +CHF  + dysrhythmias Atrial Fibrillation  Rhythm:Irregular Rate:Normal     Neuro/Psych    GI/Hepatic GERD  ,  Endo/Other  diabetes, Type 2  Renal/GU Renal InsufficiencyRenal disease     Musculoskeletal   Abdominal   Peds  Hematology   Anesthesia Other Findings   Reproductive/Obstetrics                             Anesthesia Physical Anesthesia Plan  ASA: IV  Anesthesia Plan: MAC   Post-op Pain Management:    Induction: Intravenous  Airway Management Planned: Nasal Cannula  Additional Equipment:   Intra-op Plan:   Post-operative Plan:   Informed Consent: I have reviewed the patients History and Physical, chart, labs and discussed the procedure including the risks, benefits and alternatives for the proposed anesthesia with the patient or authorized representative who has indicated his/her understanding and acceptance.     Plan Discussed with: Anesthesiologist  Anesthesia Plan Comments: (08/20/12  Plan MAC, discussed with anesthesiologist.)        Anesthesia Quick Evaluation

## 2015-08-07 NOTE — H&P (Signed)
The patient was re examined and there is no change in the patients condition since the original H and P. 

## 2015-08-08 ENCOUNTER — Encounter (HOSPITAL_COMMUNITY): Payer: Self-pay | Admitting: Ophthalmology

## 2015-08-14 ENCOUNTER — Encounter (HOSPITAL_COMMUNITY)
Admission: RE | Admit: 2015-08-14 | Discharge: 2015-08-14 | Disposition: A | Discharge status: Home / Self Care | Payer: Commercial Managed Care - HMO | Source: Ambulatory Visit | Attending: Ophthalmology | Admitting: Ophthalmology

## 2015-08-14 ENCOUNTER — Encounter (HOSPITAL_COMMUNITY): Payer: Commercial Managed Care - HMO

## 2015-08-14 ENCOUNTER — Encounter (HOSPITAL_COMMUNITY): Payer: Self-pay

## 2015-08-14 ENCOUNTER — Other Ambulatory Visit (HOSPITAL_COMMUNITY): Payer: Commercial Managed Care - HMO

## 2015-08-14 ENCOUNTER — Encounter (HOSPITAL_COMMUNITY): Payer: Commercial Managed Care - HMO | Attending: Hematology & Oncology

## 2015-08-14 DIAGNOSIS — D638 Anemia in other chronic diseases classified elsewhere: Secondary | ICD-10-CM

## 2015-08-14 DIAGNOSIS — N189 Chronic kidney disease, unspecified: Secondary | ICD-10-CM | POA: Insufficient documentation

## 2015-08-14 DIAGNOSIS — N184 Chronic kidney disease, stage 4 (severe): Secondary | ICD-10-CM

## 2015-08-14 DIAGNOSIS — D631 Anemia in chronic kidney disease: Secondary | ICD-10-CM | POA: Diagnosis not present

## 2015-08-14 DIAGNOSIS — I5043 Acute on chronic combined systolic (congestive) and diastolic (congestive) heart failure: Secondary | ICD-10-CM

## 2015-08-14 DIAGNOSIS — Z7901 Long term (current) use of anticoagulants: Secondary | ICD-10-CM | POA: Insufficient documentation

## 2015-08-14 LAB — CBC WITH DIFFERENTIAL/PLATELET
Basophils Absolute: 0 10*3/uL (ref 0.0–0.1)
Basophils Relative: 0 %
Eosinophils Absolute: 0.1 10*3/uL (ref 0.0–0.7)
Eosinophils Relative: 1 %
HEMATOCRIT: 33 % — AB (ref 36.0–46.0)
HEMOGLOBIN: 11.1 g/dL — AB (ref 12.0–15.0)
LYMPHS ABS: 1.3 10*3/uL (ref 0.7–4.0)
LYMPHS PCT: 18 %
MCH: 32.6 pg (ref 26.0–34.0)
MCHC: 33.6 g/dL (ref 30.0–36.0)
MCV: 97.1 fL (ref 78.0–100.0)
MONOS PCT: 7 %
Monocytes Absolute: 0.5 10*3/uL (ref 0.1–1.0)
NEUTROS ABS: 5.1 10*3/uL (ref 1.7–7.7)
NEUTROS PCT: 74 %
Platelets: 171 10*3/uL (ref 150–400)
RBC: 3.4 MIL/uL — ABNORMAL LOW (ref 3.87–5.11)
RDW: 12.8 % (ref 11.5–15.5)
WBC: 7 10*3/uL (ref 4.0–10.5)

## 2015-08-14 LAB — FERRITIN: Ferritin: 193 ng/mL (ref 11–307)

## 2015-08-14 NOTE — Progress Notes (Signed)
Patient didn't need injection r/t lab results and treatment parameters.  Patient aware and schedule was given for follow up.

## 2015-08-20 MED ORDER — PHENYLEPHRINE HCL 2.5 % OP SOLN
OPHTHALMIC | Status: AC
  Filled 2015-08-20: qty 15

## 2015-08-20 MED ORDER — CYCLOPENTOLATE-PHENYLEPHRINE OP SOLN OPTIME - NO CHARGE
OPHTHALMIC | Status: AC
  Filled 2015-08-20: qty 2

## 2015-08-20 MED ORDER — KETOROLAC TROMETHAMINE 0.5 % OP SOLN
OPHTHALMIC | Status: AC
  Filled 2015-08-20: qty 5

## 2015-08-20 MED ORDER — TETRACAINE HCL 0.5 % OP SOLN
OPHTHALMIC | Status: AC
  Filled 2015-08-20: qty 4

## 2015-08-21 ENCOUNTER — Ambulatory Visit (HOSPITAL_COMMUNITY)
Admission: RE | Admit: 2015-08-21 | Payer: Commercial Managed Care - HMO | Source: Ambulatory Visit | Admitting: Ophthalmology

## 2015-08-21 ENCOUNTER — Ambulatory Visit (HOSPITAL_COMMUNITY): Payer: Commercial Managed Care - HMO | Admitting: Anesthesiology

## 2015-08-21 ENCOUNTER — Ambulatory Visit (HOSPITAL_COMMUNITY)
Admission: RE | Admit: 2015-08-21 | Discharge: 2015-08-21 | Disposition: A | Discharge status: Home / Self Care | Payer: Commercial Managed Care - HMO | Source: Ambulatory Visit | Attending: Ophthalmology | Admitting: Ophthalmology

## 2015-08-21 ENCOUNTER — Encounter (HOSPITAL_COMMUNITY)
Admission: RE | Disposition: A | Discharge status: Home / Self Care | Payer: Self-pay | Source: Ambulatory Visit | Attending: Ophthalmology

## 2015-08-21 ENCOUNTER — Encounter (HOSPITAL_COMMUNITY): Admission: RE | Payer: Self-pay | Source: Ambulatory Visit

## 2015-08-21 ENCOUNTER — Encounter (HOSPITAL_COMMUNITY): Payer: Self-pay | Admitting: *Deleted

## 2015-08-21 DIAGNOSIS — I509 Heart failure, unspecified: Secondary | ICD-10-CM | POA: Insufficient documentation

## 2015-08-21 DIAGNOSIS — I110 Hypertensive heart disease with heart failure: Secondary | ICD-10-CM | POA: Insufficient documentation

## 2015-08-21 DIAGNOSIS — E7800 Pure hypercholesterolemia, unspecified: Secondary | ICD-10-CM | POA: Diagnosis not present

## 2015-08-21 DIAGNOSIS — I4891 Unspecified atrial fibrillation: Secondary | ICD-10-CM | POA: Insufficient documentation

## 2015-08-21 DIAGNOSIS — E119 Type 2 diabetes mellitus without complications: Secondary | ICD-10-CM | POA: Diagnosis not present

## 2015-08-21 DIAGNOSIS — K219 Gastro-esophageal reflux disease without esophagitis: Secondary | ICD-10-CM | POA: Diagnosis not present

## 2015-08-21 DIAGNOSIS — Z7901 Long term (current) use of anticoagulants: Secondary | ICD-10-CM | POA: Insufficient documentation

## 2015-08-21 DIAGNOSIS — H268 Other specified cataract: Secondary | ICD-10-CM | POA: Insufficient documentation

## 2015-08-21 DIAGNOSIS — Z87891 Personal history of nicotine dependence: Secondary | ICD-10-CM | POA: Insufficient documentation

## 2015-08-21 DIAGNOSIS — Z79899 Other long term (current) drug therapy: Secondary | ICD-10-CM | POA: Diagnosis not present

## 2015-08-21 HISTORY — PX: CATARACT EXTRACTION W/PHACO: SHX586

## 2015-08-21 LAB — GLUCOSE, CAPILLARY: Glucose-Capillary: 187 mg/dL — ABNORMAL HIGH (ref 65–99)

## 2015-08-21 SURGERY — PHACOEMULSIFICATION, CATARACT, WITH IOL INSERTION
Anesthesia: Monitor Anesthesia Care | Site: Eye | Laterality: Right

## 2015-08-21 SURGERY — PHACOEMULSIFICATION, CATARACT, WITH IOL INSERTION
Anesthesia: Monitor Anesthesia Care | Laterality: Right

## 2015-08-21 MED ORDER — FENTANYL CITRATE (PF) 100 MCG/2ML IJ SOLN
25.0000 ug | Freq: Once | INTRAMUSCULAR | Status: AC
  Administered 2015-08-21: 25 ug via INTRAVENOUS

## 2015-08-21 MED ORDER — LACTATED RINGERS IV SOLN
INTRAVENOUS | Status: DC
  Administered 2015-08-21: 1000 mL via INTRAVENOUS

## 2015-08-21 MED ORDER — FENTANYL CITRATE (PF) 100 MCG/2ML IJ SOLN
INTRAMUSCULAR | Status: AC
  Filled 2015-08-21: qty 2

## 2015-08-21 MED ORDER — BSS IO SOLN
INTRAOCULAR | Status: DC | PRN
  Administered 2015-08-21: 15 mL

## 2015-08-21 MED ORDER — BSS IO SOLN
INTRAOCULAR | Status: DC | PRN
  Administered 2015-08-21: 500 mL

## 2015-08-21 MED ORDER — EPINEPHRINE HCL 1 MG/ML IJ SOLN
INTRAMUSCULAR | Status: AC
  Filled 2015-08-21: qty 1

## 2015-08-21 MED ORDER — MIDAZOLAM HCL 2 MG/2ML IJ SOLN
INTRAMUSCULAR | Status: AC
Start: 2015-08-21 — End: 2015-08-21
  Filled 2015-08-21: qty 2

## 2015-08-21 MED ORDER — MIDAZOLAM HCL 2 MG/2ML IJ SOLN
1.0000 mg | INTRAMUSCULAR | Status: DC | PRN
Start: 2015-08-21 — End: 2015-08-21
  Administered 2015-08-21: 2 mg via INTRAVENOUS

## 2015-08-21 MED ORDER — PROVISC 10 MG/ML IO SOLN
INTRAOCULAR | Status: DC | PRN
  Administered 2015-08-21: 0.85 mL via INTRAOCULAR

## 2015-08-21 MED ORDER — TETRACAINE 0.5 % OP SOLN OPTIME - NO CHARGE
OPHTHALMIC | Status: DC | PRN
  Administered 2015-08-21: 2 [drp] via OPHTHALMIC

## 2015-08-21 MED ORDER — PHENYLEPHRINE HCL 2.5 % OP SOLN
1.0000 [drp] | OPHTHALMIC | Status: AC
  Administered 2015-08-21 (×3): 1 [drp] via OPHTHALMIC

## 2015-08-21 MED ORDER — TETRACAINE HCL 0.5 % OP SOLN
1.0000 [drp] | OPHTHALMIC | Status: AC
  Administered 2015-08-21 (×3): 1 [drp] via OPHTHALMIC

## 2015-08-21 MED ORDER — LIDOCAINE HCL (PF) 1 % IJ SOLN
INTRAMUSCULAR | Status: AC
  Filled 2015-08-21: qty 2

## 2015-08-21 MED ORDER — CYCLOPENTOLATE-PHENYLEPHRINE 0.2-1 % OP SOLN
1.0000 [drp] | OPHTHALMIC | Status: AC
  Administered 2015-08-21 (×3): 1 [drp] via OPHTHALMIC

## 2015-08-21 MED ORDER — KETOROLAC TROMETHAMINE 0.5 % OP SOLN
1.0000 [drp] | OPHTHALMIC | Status: AC
  Administered 2015-08-21 (×3): 1 [drp] via OPHTHALMIC

## 2015-08-21 SURGICAL SUPPLY — 10 items
CLOTH BEACON ORANGE TIMEOUT ST (SAFETY) ×3 IMPLANT
EYE SHIELD UNIVERSAL CLEAR (GAUZE/BANDAGES/DRESSINGS) ×3 IMPLANT
GLOVE BIOGEL PI IND STRL 7.0 (GLOVE) ×1 IMPLANT
GLOVE BIOGEL PI INDICATOR 7.0 (GLOVE) ×2
GLOVE EXAM NITRILE MD LF STRL (GLOVE) ×3 IMPLANT
LENS ALC ACRYL/TECN (Ophthalmic Related) ×3 IMPLANT
PAD ARMBOARD 7.5X6 YLW CONV (MISCELLANEOUS) ×3 IMPLANT
TAPE SURG TRANSPORE 1 IN (GAUZE/BANDAGES/DRESSINGS) ×1 IMPLANT
TAPE SURGICAL TRANSPORE 1 IN (GAUZE/BANDAGES/DRESSINGS) ×2
WATER STERILE IRR 250ML POUR (IV SOLUTION) ×3 IMPLANT

## 2015-08-21 NOTE — Anesthesia Postprocedure Evaluation (Signed)
  Anesthesia Post-op Note  Patient: [name]  Procedure(s) Performed: Procedure(s) (LRB): CATARACT EXTRACTION PHACO AND INTRAOCULAR LENS PLACEMENT (IOC) (Right)  Patient Location:  Short Stay  Anesthesia Type: MAC  Level of Consciousness: awake  Airway and Oxygen Therapy: Patient Spontanous Breathing  Post-op Pain: none  Post-op Assessment: Post-op Vital signs reviewed, Patient's Cardiovascular Status Stable, Respiratory Function Stable, Patent Airway, No signs of Nausea or vomiting and Pain level controlled  Post-op Vital Signs: Reviewed and stable  Complications: No apparent anesthesia complications

## 2015-08-21 NOTE — Anesthesia Procedure Notes (Signed)
Procedure Name: MAC Date/Time: 08/21/2015 7:19 AM Performed by: Vista Deck Pre-anesthesia Checklist: Patient identified, Emergency Drugs available, Suction available, Timeout performed and Patient being monitored Patient Re-evaluated:Patient Re-evaluated prior to inductionOxygen Delivery Method: Nasal Cannula

## 2015-08-21 NOTE — Anesthesia Preprocedure Evaluation (Signed)
Anesthesia Evaluation  Patient identified by MRN, date of birth, ID band Patient awake    Reviewed: Allergy & Precautions, H&P , NPO status , Patient's Chart, lab work & pertinent test results, reviewed documented beta blocker date and time   History of Anesthesia Complications Negative for: history of anesthetic complications  Airway Mallampati: I  TM Distance: >3 FB     Dental  (+) Edentulous Upper, Edentulous Lower   Pulmonary sleep apnea , pneumonia, former smoker,    breath sounds clear to auscultation       Cardiovascular hypertension, Pt. on medications +CHF  + dysrhythmias Atrial Fibrillation  Rhythm:Irregular Rate:Normal     Neuro/Psych    GI/Hepatic GERD  ,  Endo/Other  diabetes, Type 2  Renal/GU Renal InsufficiencyRenal disease     Musculoskeletal   Abdominal   Peds  Hematology   Anesthesia Other Findings   Reproductive/Obstetrics                             Anesthesia Physical Anesthesia Plan  ASA: IV  Anesthesia Plan: MAC   Post-op Pain Management:    Induction: Intravenous  Airway Management Planned: Nasal Cannula  Additional Equipment:   Intra-op Plan:   Post-operative Plan:   Informed Consent: I have reviewed the patients History and Physical, chart, labs and discussed the procedure including the risks, benefits and alternatives for the proposed anesthesia with the patient or authorized representative who has indicated his/her understanding and acceptance.     Plan Discussed with: Anesthesiologist  Anesthesia Plan Comments: (08/20/12  Plan MAC, discussed with anesthesiologist.)        Anesthesia Quick Evaluation

## 2015-08-21 NOTE — Discharge Instructions (Signed)
°  °          [facility] Instructions [place] [id number]      1. Avoid closing eyes tightly. One often closes the eye tightly when laughing, talking, sneezing, coughing or if they feel irritated. At these times, you should be careful not to close your eyes tightly.  2. Instill eye drops as instructed. To instill drops in your eye, open it, look up and have someone gently pull the lower lid down and instill a couple of drops inside the lower lid.  3. Do not touch upper lid.  4. Take Advil or Tylenol for pain.  5. You may use either eye for near work, such as reading or sewing and you may watch television.  6. You may have your hair done at the beauty parlor at any time.  7. Wear dark glasses with or without your own glasses if you are in bright light.  8. Call our office at [phone] or [phone] if you have sharp pain in your eye or unusual symptoms.  9.  FOLLOW UP WITH DR. SHAPIRO TODAY IN HIS [place] OFFICE AT 1:00pm.    I have received a copy of the above instructions and will follow them.     IF YOU ARE IN IMMEDIATE DANGER CALL 911!  It is important for you to keep your follow-up appointment with your physician after discharge, OR, for you /your caregiver to make a follow-up appointment with your physician / medical provider after discharge.  Show these instructions to the next healthcare provider you see.

## 2015-08-21 NOTE — H&P (Signed)
The patient was re examined and there is no change in the patients condition since the original H and P. 

## 2015-08-21 NOTE — Op Note (Signed)
Patient brought to the operating room and prepped and draped in the usual manner.  Lid speculum inserted in right eye.  Stab incision made at the twelve o'clock position.  Provisc instilled in the anterior chamber.   A 2.4 mm. Stab incision was made temporally.  An anterior capsulotomy was done with a bent 25 gauge needle.  The nucleus was hydrodissected.  The Phaco tip was inserted in the anterior chamber and the nucleus was emulsified.  CDE was 8.55.  The cortical material was then removed with the I and A tip.  Posterior capsule was the polished.  The anterior chamber was deepened with Provisc.  A 21.0 Diopter Alcon SN60WF IOL was then inserted in the capsular bag.  Provisc was then removed with the I and A tip.  The wound was then hydrated.  Patient sent to the Recovery Room in good condition with follow up in my office.  Preoperative Diagnosis:  Nuclear Cataract OD Postoperative Diagnosis:  Same Procedure name: Kelman Phacoemulsification OD with IOL

## 2015-08-21 NOTE — Transfer of Care (Signed)
Immediate Anesthesia Transfer of Care Note  Patient: [name]  Procedure(s) Performed: Procedure(s) (LRB): CATARACT EXTRACTION PHACO AND INTRAOCULAR LENS PLACEMENT (IOC) (Right)  Patient Location: Shortstay  Anesthesia Type: MAC  Level of Consciousness: awake  Airway & Oxygen Therapy: Patient Spontanous Breathing   Post-op Assessment: Report given to PACU RN, Post -op Vital signs reviewed and stable and Patient moving all extremities  Post vital signs: Reviewed and stable  Complications: No apparent anesthesia complications

## 2015-08-22 ENCOUNTER — Encounter (HOSPITAL_COMMUNITY): Payer: Self-pay | Admitting: Ophthalmology

## 2015-08-24 ENCOUNTER — Other Ambulatory Visit: Payer: Self-pay | Admitting: Internal Medicine

## 2015-08-27 ENCOUNTER — Other Ambulatory Visit: Payer: Self-pay | Admitting: Internal Medicine

## 2015-09-04 ENCOUNTER — Encounter (HOSPITAL_COMMUNITY): Payer: Commercial Managed Care - HMO | Attending: Hematology & Oncology

## 2015-09-04 VITALS — BP 107/40 | HR 48 | Temp 98.0°F | Resp 16

## 2015-09-04 DIAGNOSIS — D631 Anemia in chronic kidney disease: Secondary | ICD-10-CM | POA: Diagnosis not present

## 2015-09-04 DIAGNOSIS — N183 Chronic kidney disease, stage 3 (moderate): Secondary | ICD-10-CM | POA: Diagnosis not present

## 2015-09-04 DIAGNOSIS — Z7901 Long term (current) use of anticoagulants: Secondary | ICD-10-CM | POA: Insufficient documentation

## 2015-09-04 DIAGNOSIS — D649 Anemia, unspecified: Secondary | ICD-10-CM

## 2015-09-04 DIAGNOSIS — N189 Chronic kidney disease, unspecified: Secondary | ICD-10-CM | POA: Diagnosis present

## 2015-09-04 DIAGNOSIS — D638 Anemia in other chronic diseases classified elsewhere: Secondary | ICD-10-CM

## 2015-09-04 DIAGNOSIS — N184 Chronic kidney disease, stage 4 (severe): Secondary | ICD-10-CM

## 2015-09-04 LAB — CBC WITH DIFFERENTIAL/PLATELET
Basophils Absolute: 0 10*3/uL (ref 0.0–0.1)
Basophils Relative: 0 %
EOS ABS: 0.1 10*3/uL (ref 0.0–0.7)
EOS PCT: 2 %
HCT: 32 % — ABNORMAL LOW (ref 36.0–46.0)
HEMOGLOBIN: 10.8 g/dL — AB (ref 12.0–15.0)
LYMPHS ABS: 1.3 10*3/uL (ref 0.7–4.0)
LYMPHS PCT: 22 %
MCH: 33.1 pg (ref 26.0–34.0)
MCHC: 33.8 g/dL (ref 30.0–36.0)
MCV: 98.2 fL (ref 78.0–100.0)
MONOS PCT: 8 %
Monocytes Absolute: 0.4 10*3/uL (ref 0.1–1.0)
NEUTROS PCT: 68 %
Neutro Abs: 4 10*3/uL (ref 1.7–7.7)
Platelets: 189 10*3/uL (ref 150–400)
RBC: 3.26 MIL/uL — AB (ref 3.87–5.11)
RDW: 12.7 % (ref 11.5–15.5)
WBC: 5.9 10*3/uL (ref 4.0–10.5)

## 2015-09-04 MED ORDER — DARBEPOETIN ALFA 60 MCG/0.3ML IJ SOSY
60.0000 ug | PREFILLED_SYRINGE | Freq: Once | INTRAMUSCULAR | Status: AC
  Administered 2015-09-04: 60 ug via SUBCUTANEOUS

## 2015-09-04 MED ORDER — DARBEPOETIN ALFA 60 MCG/0.3ML IJ SOSY
PREFILLED_SYRINGE | INTRAMUSCULAR | Status: AC
  Filled 2015-09-04: qty 0.3

## 2015-09-04 NOTE — Progress Notes (Signed)
[name] presents today for injection per MD orders. Aranesp 60 mcg administered SQ in left Abdomen. Administration without incident. Patient tolerated well.  

## 2015-09-04 NOTE — Patient Instructions (Signed)
[place] at [facility] Discharge Instructions  RECOMMENDATIONS MADE BY THE CONSULTANT AND ANY TEST RESULTS WILL BE SENT TO YOUR REFERRING PHYSICIAN.  Aranesp injection today.   Please return as scheduled.    Thank you for choosing [place] at [facility] to provide your oncology and hematology care.  To afford each patient quality time with our provider, please arrive at least 15 minutes before your scheduled appointment time.   Beginning January 23rd 2017 lab work for the [facility] will be done in the  Main lab at [facility] on 1st floor. If you have a lab appointment with the [place] please come in thru the  Main Entrance and check in at the main information desk  You need to re-schedule your appointment should you arrive 10 or more minutes late.  We strive to give you quality time with our providers, and arriving late affects you and other patients whose appointments are after yours.  Also, if you no show three or more times for appointments you may be dismissed from the clinic at the providers discretion.     Again, thank you for choosing [facility].  Our hope is that these requests will decrease the amount of time that you wait before being seen by our physicians.       _____________________________________________________________  Should you have questions after your visit to [facility], please contact our office at [phone] between the hours of 8:30 a.m. and 4:30 p.m.  Voicemails left after 4:30 p.m. will not be returned until the following business day.  For prescription refill requests, have your pharmacy contact our office.         Resources For Cancer Patients and their Caregivers ? American Cancer Society: Can assist with transportation, wigs, general needs, runs Look Good Feel Better.        ([phone] 301-1004 ? Cancer Care: Provides financial assistance, online support groups,  medication/co-pay assistance.  1-800-813-HOPE 315-010-4939) ? [place] Assists [place] cancer patients and their families through emotional , educational and financial support.  551-621-1375 ? [place] DSS Where to apply for food stamps, Medicaid and utility assistance. 931-345-3110 ? [facility]: Transportation to medical appointments. 986-026-4677 ? Social Security Administration: May apply for disability if have a Stage IV cancer. 626 049 3700 (480)672-8844 ? [employer], Disability and Transit Services: Assists with nutrition, care and transit needs. 510-546-0781

## 2015-09-20 ENCOUNTER — Ambulatory Visit: Payer: Commercial Managed Care - HMO | Admitting: Cardiology

## 2015-09-25 ENCOUNTER — Encounter (HOSPITAL_COMMUNITY): Payer: Commercial Managed Care - HMO | Attending: Hematology & Oncology

## 2015-09-25 ENCOUNTER — Other Ambulatory Visit (HOSPITAL_COMMUNITY): Payer: Self-pay | Admitting: Cardiology

## 2015-09-25 DIAGNOSIS — I5043 Acute on chronic combined systolic (congestive) and diastolic (congestive) heart failure: Secondary | ICD-10-CM

## 2015-09-25 DIAGNOSIS — N189 Chronic kidney disease, unspecified: Secondary | ICD-10-CM | POA: Diagnosis not present

## 2015-09-25 DIAGNOSIS — Z7901 Long term (current) use of anticoagulants: Secondary | ICD-10-CM | POA: Insufficient documentation

## 2015-09-25 DIAGNOSIS — D638 Anemia in other chronic diseases classified elsewhere: Secondary | ICD-10-CM

## 2015-09-25 DIAGNOSIS — N184 Chronic kidney disease, stage 4 (severe): Secondary | ICD-10-CM

## 2015-09-25 DIAGNOSIS — D631 Anemia in chronic kidney disease: Secondary | ICD-10-CM | POA: Diagnosis not present

## 2015-09-25 LAB — FERRITIN: Ferritin: 174 ng/mL (ref 11–307)

## 2015-09-25 LAB — CBC WITH DIFFERENTIAL/PLATELET
BASOS PCT: 0 %
Basophils Absolute: 0 10*3/uL (ref 0.0–0.1)
EOS ABS: 0.1 10*3/uL (ref 0.0–0.7)
Eosinophils Relative: 1 %
HEMATOCRIT: 32.6 % — AB (ref 36.0–46.0)
HEMOGLOBIN: 11.1 g/dL — AB (ref 12.0–15.0)
LYMPHS ABS: 1.2 10*3/uL (ref 0.7–4.0)
Lymphocytes Relative: 18 %
MCH: 33.1 pg (ref 26.0–34.0)
MCHC: 34 g/dL (ref 30.0–36.0)
MCV: 97.3 fL (ref 78.0–100.0)
Monocytes Absolute: 0.6 10*3/uL (ref 0.1–1.0)
Monocytes Relative: 8 %
NEUTROS ABS: 5.1 10*3/uL (ref 1.7–7.7)
NEUTROS PCT: 73 %
Platelets: 173 10*3/uL (ref 150–400)
RBC: 3.35 MIL/uL — AB (ref 3.87–5.11)
RDW: 12.7 % (ref 11.5–15.5)
WBC: 6.9 10*3/uL (ref 4.0–10.5)

## 2015-09-25 NOTE — Progress Notes (Signed)
Patient didn't need injection today r/t lab results and treatment parameters.  Patient to return for follow up as scheduled.

## 2015-09-26 ENCOUNTER — Encounter: Payer: Self-pay | Admitting: Cardiology

## 2015-09-27 ENCOUNTER — Other Ambulatory Visit: Payer: Self-pay | Admitting: Internal Medicine

## 2015-09-27 ENCOUNTER — Other Ambulatory Visit (HOSPITAL_COMMUNITY): Payer: Self-pay | Admitting: Cardiology

## 2015-10-10 ENCOUNTER — Ambulatory Visit (INDEPENDENT_AMBULATORY_CARE_PROVIDER_SITE_OTHER): Payer: Commercial Managed Care - HMO | Admitting: Physician Assistant

## 2015-10-10 ENCOUNTER — Encounter: Payer: Self-pay | Admitting: Physician Assistant

## 2015-10-10 VITALS — BP 116/64 | HR 59 | Ht 65.0 in | Wt 166.0 lb

## 2015-10-10 DIAGNOSIS — I5042 Chronic combined systolic (congestive) and diastolic (congestive) heart failure: Secondary | ICD-10-CM | POA: Diagnosis not present

## 2015-10-10 DIAGNOSIS — I10 Essential (primary) hypertension: Secondary | ICD-10-CM

## 2015-10-10 DIAGNOSIS — I480 Paroxysmal atrial fibrillation: Secondary | ICD-10-CM

## 2015-10-10 DIAGNOSIS — N184 Chronic kidney disease, stage 4 (severe): Secondary | ICD-10-CM | POA: Diagnosis not present

## 2015-10-10 NOTE — Progress Notes (Signed)
Cardiology Office Note    Date:  10/10/2015   ID:  [name], DOB 21-Dec-1945, MRN [id number]  PCP:  Robert Bellow, MD  Cardiologist: Dr. Lovena Le  CHF clinic: Dr. Missy [name]  Chief complaint routine follow-up  History of Present Illness:  [name] is a 70 y.o. female with history of chronic systolic CHF from nonischemic cardiomyopathy, prior PE, CKD, and paroxysmal atrial fibrillation. She has a long history of cardiomyopathy, dating back to 1999. At that time, she had coronary angiography showing no significant disease. She was initially followed by Dr [name], later by Dr. Lovena Le. She had her initial CRT-D device in 2005. This was removed in 2007 due to enterococcal bacteremia with vegetation. She had Medtronic CRT- D device placed in 8/14. She has had trouble with paroxysmal atrial fibrillation, and she was put on amiodarone and cardioverted in 12/14. TEE in 12/14 showed EF 15% but RV appeared normal. 6/16, this time with enterococcal bacteremia and pacemaker lead vegetation by TEE. Her CRT-D device was again extracted. Her device remains out and she is still getting antibiotics via PICC. Echo (4/16) with EF 15-20%, moderate LVH, diffuse hypokinesis, mild-moderate AI, mild MR, PASP 33 mmHg.    Had [place] on 04/18/14 for possible low output. This showed volume depletion with normal cardiac output.  Patient had an abnormal sleep study 03/16/15 and CPAP was recommended. The pressures were too high and she called to have them lowered but could not get in with Dr. Radford Pax for 3 months. She became frustrated and said the machine back.  Patient comes in today accompanied by her husband. She has done quite well over the past 6 months. She does not weigh herself daily but has gained 14 pounds since she was seen in August and the heart failure clinic. This is not fluid gain but regular weight gain. She denies any chest pain, palpitations, dyspnea, edema, dizziness or presyncope. She has  chronic dyspnea on exertion but it has been stable. She uses very little salt.      Past Medical History  Diagnosis Date  . Cardiomyopathy, nonischemic ([place])     a. 1999 nl cath;  b. 12/05 [employer] [place];  c. 10/2005 ICD extraction 2/2 enterococcus bacteremia and Veg on RV lead;  c. 05/2008 low risk Myoview (scarring w/ some evidence of inf ischemia);  d. 11/2012 Echo: EF 15-20%;  e. 01/2013 s/p MDT [place] CRT D, ser # [id number];  f. 05/2013 Echo: EF 15%.  . Enterococcal infection     a. 10/2005 - AICD-explanted  . Pulmonary embolism ([place])     a. 07/2005 after total right hip arthroplasty  . Hilar density     a. infrahilar mass/adenopathy on CT scan 5/07; subsequently  resolved  . LBBB (left bundle branch block)   . GERD (gastroesophageal reflux disease)   . Hyperlipidemia   . Hypertension   . Tobacco abuse     a. discontinued in 1997, and then resumed  . Urinary incontinence   . Anemia     a. mild/chronic  . Villous adenoma of colon     a. tubovillous adenomatous polyp with focal high grade dysplasia; presented with hematochezia - followed by Dr. Laural [name].  . Implantable cardioverter-defibrillator-CRT- Mdt     a.  01/2013 s/p MDT [place] CRT D, ser # [id number]  . Chronic systolic CHF (congestive heart failure) ([place])     a. 11/2012 Echo: EF 15-20%;  b. 05/2013 TEE EF 15%.  . Pneumonia 07/2005  .  Obstructive sleep apnea     a. mild-did not tolerate CPAP (01/24/2013)  . History of blood transfusion   . Degenerative joint disease     of knees, shoulder, and hips  . PAF (paroxysmal atrial fibrillation) ([place])     a. 07/2012 s/p TEE/DCCV;  b. chronic coumadin;  c. 05/2013 Recurrent Afib->TEE/DCCV and amio initiation.  . CKD (chronic kidney disease), stage III     creatinin-1.44 in 1/09; 1.51 in 1/10  . Type II diabetes mellitus ([place])     type 2  . ICD (implantable cardioverter-defibrillator) infection ([name])     removed 2016    Past Surgical History  Procedure Laterality Date  .  Pacemaker removal  11/18/05    Enterococcal infection  . Total hip arthroplasty Right 07/2005  . Knee arthroscopy Right 1980's?  . Colonoscopy w/ polypectomy  2009  . Cardioversion N/A 08/20/2012    Procedure: TEE GUIDED CARDIOVERSION;  Surgeon: Yehuda Savannah, MD;  Location: [facility];  Service: Cardiovascular;  Laterality: N/A;  To be done @ bedside  . Tee without cardioversion N/A 08/20/2012    Procedure: TRANSESOPHAGEAL ECHOCARDIOGRAM (TEE);  Surgeon: Yehuda Savannah, MD;  Location: [facility];  Service: Cardiovascular;  Laterality: N/A;  . Bi-ventricular implantable cardioverter defibrillator  (crt-d)  01/24/2013  . A-v cardiac pacemaker insertion  12/05    Biventricular pacemaker/AICD  . Abdominal hysterectomy  1990/92    Initial partial hysterectomy followed by BSO  . Tubal ligation  1980's  . Cardiac catheterization    . Colonoscopy with esophagogastroduodenoscopy (egd) N/A 06/10/2013    Procedure: COLONOSCOPY WITH ESOPHAGOGASTRODUODENOSCOPY (EGD);  Surgeon: Tina Houston, MD;  [facility];  Service: Endoscopy;  Laterality: N/A;  925  . Tee without cardioversion N/A 06/20/2013    Procedure: TRANSESOPHAGEAL ECHOCARDIOGRAM (TEE);  Surgeon: Tina Spark, MD;  Location: [facility];  Service: Cardiovascular;  Laterality: N/A;  . Cardioversion N/A 06/20/2013    Procedure: CARDIOVERSION;  Surgeon: Tina Spark, MD;  Location: [facility] ENDOSCOPY;  Service: Cardiovascular;  Laterality: N/A;  . Bi-ventricular implantable cardioverter defibrillator N/A 01/24/2013    Procedure: BI-VENTRICULAR IMPLANTABLE CARDIOVERTER DEFIBRILLATOR  (CRT-D);  Surgeon: Tina Lance, MD;  Location: [facility] CATH LAB;  Service: Cardiovascular;  Laterality: N/A;  . Right heart catheterization N/A 04/18/2014    Procedure: RIGHT HEART CATH;  Surgeon: Tina Dresser, MD;  Location: [facility] CATH LAB;  Service: Cardiovascular;  Laterality: N/A;  . Esophagogastroduodenoscopy N/A 10/21/2014    Procedure:  ESOPHAGOGASTRODUODENOSCOPY (EGD);  Surgeon: Tina Castle, MD;  Location: [facility];  Service: Endoscopy;  Laterality: N/A;  . Colonoscopy Left 10/24/2014    Procedure: COLONOSCOPY;  Surgeon: Carol Ada, MD;  Location: [facility] ENDOSCOPY;  Service: Endoscopy;  Laterality: Left;  Freda Munro capsule study N/A 10/24/2014    Procedure: GIVENS CAPSULE STUDY;  Surgeon: Carol Ada, MD;  Location: [facility] ENDOSCOPY;  Service: Endoscopy;  Laterality: N/A;  . Tee without cardioversion N/A 12/08/2014    Procedure: TRANSESOPHAGEAL ECHOCARDIOGRAM (TEE);  Surgeon: Fay Records, MD;  [facility];  Service: Cardiovascular;  Laterality: N/A;  . Icd lead removal N/A 12/13/2014    Procedure: ICD LEAD REMOVAL/EXTRACTION ;  Surgeon: Tina Lance, MD;  Location: [facility];  Service: Cardiovascular;  Laterality: N/A;  [place] back up  . Cataract extraction w/phaco Left 08/07/2015    Procedure: CATARACT EXTRACTION PHACO AND INTRAOCULAR LENS PLACEMENT (IOC);  Surgeon: Rutherford Guys, MD;  Location: [facility];  Service: Ophthalmology;  Laterality: Left;  CDE: 7.88  . Cataract extraction w/phaco Right 08/21/2015    Procedure: CATARACT EXTRACTION PHACO AND INTRAOCULAR LENS PLACEMENT (IOC);  Surgeon: Rutherford Guys, MD;  Location: [facility];  Service: Ophthalmology;  Laterality: Right;  CDE:8.55    Current Medications: Outpatient Prescriptions Prior to Visit  Medication Sig Dispense Refill  . amiodarone (PACERONE) 200 MG tablet TAKE 1 TABLET BY MOUTH DAILY. 90 tablet 3  . carvedilol (COREG) 3.125 MG tablet TAKE 3 TABLETS BY MOUTH TWICE DAILY WITH A MEAL. 180 tablet 0  . colchicine 0.6 MG tablet Take 0.6 mg by mouth 2 (two) times daily as needed (gout). Reported on 07/24/2015    . digoxin (LANOXIN) 0.125 MG tablet Take 0.5 tablets (0.0625 mg total) by mouth every other day. 30 tablet 3  . feeding supplement, ENSURE ENLIVE, (ENSURE ENLIVE) LIQD Take 237 mLs by mouth 2 (two) times daily between meals. (Patient taking differently: Take 237  mLs by mouth daily. ) 237 mL 12  . ferrous sulfate 324 (65 FE) MG TBEC Take 1 tablet (325 mg total) by mouth 2 (two) times daily. 60 tablet   . furosemide (LASIX) 40 MG tablet TAKE 1 TABLET BY MOUTH TWICE DAILY. 60 tablet 3  . glimepiride (AMARYL) 2 MG tablet Take 2 mg by mouth daily.    . isosorbide mononitrate (IMDUR) 30 MG 24 hr tablet Take 1 tablet (30 mg total) by mouth daily. 30 tablet 0  . levothyroxine (SYNTHROID, LEVOTHROID) 25 MCG tablet Take 1 tablet (25 mcg total) by mouth daily before breakfast. 30 tablet 0  . lisinopril (PRINIVIL,ZESTRIL) 10 MG tablet TAKE ONE TABLET BY MOUTH DAILY. 30 tablet 3  . metoCLOPramide (REGLAN) 5 MG tablet Take 5 mg by mouth 2 (two) times daily.     . ondansetron (ZOFRAN) 4 MG tablet Take 4 mg by mouth every 4 (four) hours as needed for nausea or vomiting.    Marland Kitchen oxyCODONE-acetaminophen (PERCOCET) 10-325 MG per tablet Take 1 tablet by mouth every 6 (six) hours as needed for pain.     . polyethylene glycol (MIRALAX / GLYCOLAX) packet Take 17 g by mouth daily as needed for mild constipation.    . pravastatin (PRAVACHOL) 40 MG tablet Take 80 mg by mouth at bedtime.     Marland Kitchen spironolactone (ALDACTONE) 25 MG tablet Take 0.5 tablets (12.5 mg total) by mouth daily. 45 tablet 3  . warfarin (COUMADIN) 2.5 MG tablet Take 0.5-1 tablets (1.25-2.5 mg total) by mouth daily at 6 PM. Takes 1 tablet on Mon, Wed, Fri, take 0.5 tablet on all other days     No facility-administered medications prior to visit.     Allergies:   Review of patient's allergies indicates no known allergies.   Social History   Social History  . Marital Status: Married    Spouse Name: N/A  . Number of Children: N/A  . Years of Education: N/A   Social History Main Topics  . Smoking status: Former Smoker -- 12 years    Types: Cigarettes  . Smokeless tobacco: Never Used     Comment: 05/2013: Smokes an occasional cigarette.  Says that she doesn't inhale.  . Alcohol Use: No  . Drug Use: No  .  Sexual Activity: Not Currently    Birth Control/ Protection: None   Other Topics Concern  . None   Social History Narrative   Married, lives in [place] with spouse. Retired Secretary/administrator.      Family History:  The patient's    family history  includes Arthritis in her other; Coronary artery disease in her father; Diabetes in her brother, mother, and other; Heart disease in her other; Hypertension in her brother and mother; Lung cancer in her brother.   ROS:   Please see the history of present illness.    Review of Systems  Constitution: Positive for weakness, malaise/fatigue and weight gain. Negative for chills, decreased appetite, diaphoresis, fever and night sweats.  HENT: Negative.   Eyes: Negative.   Cardiovascular: Positive for dyspnea on exertion.  Respiratory: Positive for shortness of breath and snoring. Negative for cough, hemoptysis, sputum production and wheezing.   Skin: Negative.   Musculoskeletal: Positive for muscle weakness and myalgias.  Gastrointestinal: Negative.    All other systems reviewed and are negative.   PHYSICAL EXAM:   VS:  BP 116/64 mmHg  Pulse 59  Ht '5\' 5"'  (1.651 m)  Wt 166 lb (75.297 kg)  BMI 27.62 kg/m2  SpO2 97%   GEN: Well nourished, well developed, in no acute distress HEENT: normal Neck: no JVD, carotid bruits, or masses Cardiac:  RRR; positive S3 and S4, 2/6 systolic murmur at the left sternal border, 2/6 systolic murmur at the apex, no edema  Respiratory:  clear to auscultation bilaterally, normal work of breathing GI: soft, nontender, nondistended, + BS MS: no deformity or atrophy Skin: warm and dry, no rash Neuro:  Alert and Oriented x 3, Strength and sensation are intact Psych: euthymic mood, full affect  Wt Readings from Last 3 Encounters:  10/10/15 166 lb (75.297 kg)  07/30/15 163 lb (73.936 kg)  07/24/15 162 lb 4.8 oz (73.619 kg)      Studies/Labs Reviewed:   EKG:  EKG is Not ordered today.    Recent Labs: 11/30/2014:  B Natriuretic Peptide 1322.3* 02/06/2015: ALT 14; TSH 14.487* 07/30/2015: BUN 60*; Creatinine, Ser 2.56*; Potassium 4.7; Sodium 138 09/25/2015: Hemoglobin 11.1*; Platelets 173   Lipid Panel    Component Value Date/Time   CHOL 171 05/27/2010   TRIG 154 05/27/2010   HDL 38 05/27/2010   CHOLHDL 7.7 Ratio 07/16/2007 0000   VLDL 64* 07/16/2007 0000   LDLCALC 102 05/27/2010    Additional studies/ records that were reviewed today include:  2-D echo 09/2014 Study Conclusions   - Left ventricle: The cavity size was normal. Wall thickness was   increased in a pattern of moderate LVH. Systolic function was   severely reduced. The estimated ejection fraction was in the   range of 15% to 20%. Diffuse hypokinesis. Features are consistent   with a pseudonormal left ventricular filling pattern, with   concomitant abnormal relaxation and increased filling pressure   (grade 2 diastolic dysfunction). - Aortic valve: Mildly calcified annulus. Trileaflet; mildly   thickened leaflets. There was mild to moderate regurgitation.   Valve area (VTI): 1.51 cm^2. Valve area (Vmax): 1.67 cm^2.   Regurgitation pressure half-time: 479 ms. - Mitral valve: Mildly calcified annulus. Mildly thickened leaflets   . There was mild regurgitation. - Left atrium: The atrium was severely dilated. - Right atrium: The atrium was mildly dilated. - Pulmonary arteries: PA peak pressure: 33 mm Hg (S). PASP is   borderline elevated. - Technically difficult study.       ASSESSMENT:    1. Chronic combined systolic and diastolic CHF (congestive heart failure) ([place])   2. Essential hypertension   3. Paroxysmal atrial fibrillation (HCC)   4. CKD (chronic kidney disease), stage IV (HCC)      PLAN:  In order of problems  listed above: Overall the patient's heart failure is well compensated she has gained 14 pounds but this is due to eating and not fluid retention. Recommend that she weigh herself daily and take an extra Lasix  if needed for weight gain of 2 or 3 pounds overnight. Continue 2 g sodium diet. Follow-up in heart failure clinic in 4-6 months.  Blood pressure is stable  Continue follow-up with Dr. Lovena Le for PAF patient is on Coumadin and amiodarone  Have given her prescription for be met to be checked her last creatinine was 2.56 in February. She gets frequent CBCs checked by oncology so she will have it drawn next week.    Medication Adjustments/Labs and Tests Ordered: Current medicines are reviewed at length with the patient today.  Concerns regarding medicines are outlined above.  Medication changes, Labs and Tests ordered today are listed in the Patient Instructions below. Patient Instructions  Your physician recommends that you schedule a follow-up appointment in: 4-6 Months with Dr. Haroldine Laws  Your physician recommends that you continue on your current medications as directed. Please refer to the Current Medication list given to you today.  Your physician recommends that you return for lab work in:  Please have BMET Done at next lab draw.   If you need a refill on your cardiac medications before your next appointment, please call your pharmacy.  Thank you for choosing [facility]!         Sumner Boast, PA-C  10/10/2015 [facility] [address], [place], [place]  [address] Phone: [phone]; Fax: [phone]

## 2015-10-10 NOTE — Patient Instructions (Signed)
Your physician recommends that you schedule a follow-up appointment in: 4-6 Months with Dr. Haroldine Laws  Your physician recommends that you continue on your current medications as directed. Please refer to the Current Medication list given to you today.  Your physician recommends that you return for lab work in:  Please have BMET Done at next lab draw.   If you need a refill on your cardiac medications before your next appointment, please call your pharmacy.  Thank you for choosing [facility]!

## 2015-10-15 NOTE — Assessment & Plan Note (Deleted)
Anemia of chronic renal disease, currently on Aranesp 60 mcg every 3 weeks.  Additionally, she requires IV iron support intermittently.  Oncology Flowsheet 07/10/2015 09/04/2015  Darbepoetin Alfa (ARANESP) [place] 60 mcg 60 mcg   I personally reviewed and went over laboratory results with the patient.  The results are noted within this dictation.  Supportive therapy plan reviewed.  Labs today: CBC  Supportive therapy plan is reviewed.  Labs every 3 weeks: CBC  Labs every 90 days: Ferritin.    Continue same dose of Aranesp 60 mcg every 3 weeks.  Return in 4 months for follow-up.

## 2015-10-15 NOTE — Progress Notes (Signed)
No SHOW

## 2015-10-16 ENCOUNTER — Other Ambulatory Visit (HOSPITAL_COMMUNITY): Payer: Commercial Managed Care - HMO

## 2015-10-16 ENCOUNTER — Encounter (HOSPITAL_BASED_OUTPATIENT_CLINIC_OR_DEPARTMENT_OTHER): Payer: Commercial Managed Care - HMO

## 2015-10-16 ENCOUNTER — Other Ambulatory Visit (HOSPITAL_COMMUNITY)
Admission: RE | Admit: 2015-10-16 | Discharge: 2015-10-16 | Disposition: A | Discharge status: Home / Self Care | Payer: Medicare HMO | Source: Ambulatory Visit | Attending: Physician Assistant | Admitting: Physician Assistant

## 2015-10-16 ENCOUNTER — Encounter (HOSPITAL_COMMUNITY): Payer: Commercial Managed Care - HMO

## 2015-10-16 ENCOUNTER — Ambulatory Visit (HOSPITAL_COMMUNITY): Payer: Commercial Managed Care - HMO | Admitting: Oncology

## 2015-10-16 VITALS — BP 124/63 | HR 55 | Temp 98.0°F | Resp 16

## 2015-10-16 DIAGNOSIS — N189 Chronic kidney disease, unspecified: Secondary | ICD-10-CM | POA: Diagnosis present

## 2015-10-16 DIAGNOSIS — D649 Anemia, unspecified: Secondary | ICD-10-CM

## 2015-10-16 DIAGNOSIS — N184 Chronic kidney disease, stage 4 (severe): Secondary | ICD-10-CM

## 2015-10-16 DIAGNOSIS — D638 Anemia in other chronic diseases classified elsewhere: Secondary | ICD-10-CM

## 2015-10-16 DIAGNOSIS — D631 Anemia in chronic kidney disease: Secondary | ICD-10-CM

## 2015-10-16 DIAGNOSIS — N183 Chronic kidney disease, stage 3 (moderate): Secondary | ICD-10-CM | POA: Diagnosis not present

## 2015-10-16 LAB — BASIC METABOLIC PANEL
ANION GAP: 10 (ref 5–15)
BUN: 51 mg/dL — ABNORMAL HIGH (ref 6–20)
CALCIUM: 9.4 mg/dL (ref 8.9–10.3)
CO2: 25 mmol/L (ref 22–32)
Chloride: 104 mmol/L (ref 101–111)
Creatinine, Ser: 2.52 mg/dL — ABNORMAL HIGH (ref 0.44–1.00)
GFR, EST AFRICAN AMERICAN: 21 mL/min — AB (ref >60)
GFR, EST NON AFRICAN AMERICAN: 18 mL/min — AB (ref >60)
GLUCOSE: 223 mg/dL — AB (ref 65–99)
POTASSIUM: 4.6 mmol/L (ref 3.5–5.1)
SODIUM: 139 mmol/L (ref 135–145)

## 2015-10-16 LAB — CBC WITH DIFFERENTIAL/PLATELET
BASOS ABS: 0 10*3/uL (ref 0.0–0.1)
Basophils Relative: 0 %
EOS PCT: 1 %
Eosinophils Absolute: 0.1 10*3/uL (ref 0.0–0.7)
HEMATOCRIT: 30.9 % — AB (ref 36.0–46.0)
Hemoglobin: 10.3 g/dL — ABNORMAL LOW (ref 12.0–15.0)
LYMPHS PCT: 19 %
Lymphs Abs: 1 10*3/uL (ref 0.7–4.0)
MCH: 32.8 pg (ref 26.0–34.0)
MCHC: 33.3 g/dL (ref 30.0–36.0)
MCV: 98.4 fL (ref 78.0–100.0)
Monocytes Absolute: 0.4 10*3/uL (ref 0.1–1.0)
Monocytes Relative: 7 %
NEUTROS ABS: 4 10*3/uL (ref 1.7–7.7)
Neutrophils Relative %: 73 %
PLATELETS: 169 10*3/uL (ref 150–400)
RBC: 3.14 MIL/uL — AB (ref 3.87–5.11)
RDW: 12 % (ref 11.5–15.5)
WBC: 5.5 10*3/uL (ref 4.0–10.5)

## 2015-10-16 LAB — FERRITIN: FERRITIN: 192 ng/mL (ref 11–307)

## 2015-10-16 MED ORDER — DARBEPOETIN ALFA 60 MCG/0.3ML IJ SOSY
60.0000 ug | PREFILLED_SYRINGE | Freq: Once | INTRAMUSCULAR | Status: AC
  Administered 2015-10-16: 60 ug via SUBCUTANEOUS

## 2015-10-16 MED ORDER — DARBEPOETIN ALFA 60 MCG/0.3ML IJ SOSY
PREFILLED_SYRINGE | INTRAMUSCULAR | Status: AC
  Filled 2015-10-16: qty 0.3

## 2015-10-16 NOTE — Patient Instructions (Signed)
[place] at [facility] Discharge Instructions  RECOMMENDATIONS MADE BY THE CONSULTANT AND ANY TEST RESULTS WILL BE SENT TO YOUR REFERRING PHYSICIAN.  Aranesp injection today.    Thank you for choosing [facility] at [facility] to provide your oncology and hematology care.  To afford each patient quality time with our provider, please arrive at least 15 minutes before your scheduled appointment time.   Beginning January 23rd 2017 lab work for the [facility] will be done in the  Main lab at [facility] on 1st floor. If you have a lab appointment with the [facility] please come in thru the  Main Entrance and check in at the main information desk  You need to re-schedule your appointment should you arrive 10 or more minutes late.  We strive to give you quality time with our providers, and arriving late affects you and other patients whose appointments are after yours.  Also, if you no show three or more times for appointments you may be dismissed from the clinic at the providers discretion.     Again, thank you for choosing [facility].  Our hope is that these requests will decrease the amount of time that you wait before being seen by our physicians.       _____________________________________________________________  Should you have questions after your visit to [facility], please contact our office at [phone] between the hours of 8:30 a.m. and 4:30 p.m.  Voicemails left after 4:30 p.m. will not be returned until the following business day.  For prescription refill requests, have your pharmacy contact our office.         Resources For Cancer Patients and their Caregivers ? American Cancer Society: Can assist with transportation, wigs, general needs, runs Look Good Feel Better.        724-518-9141 ? Cancer Care: Provides financial assistance, online support groups, medication/co-pay assistance.   1-800-813-HOPE 782 356 4283) ? [place] Assists [place] cancer patients and their families through emotional , educational and financial support.  720-389-9993 ? [place] DSS Where to apply for food stamps, Medicaid and utility assistance. 608-075-9108 ? [facility]: Transportation to medical appointments. 779-714-3213 ? Social Security Administration: May apply for disability if have a Stage IV cancer. (986) 191-7092 605-348-5355 ? [employer], Disability and Transit Services: Assists with nutrition, care and transit needs. 316-222-4261

## 2015-10-16 NOTE — Progress Notes (Signed)
[name] presents today for injection per MD orders. Aranesp 60 mcg administered SQ in left Abdomen. Administration without incident. Patient tolerated well.  

## 2015-11-05 NOTE — Progress Notes (Signed)
Robert Bellow, MD [place] [address]  Anemia of chronic disease  CURRENT THERAPY: Aranesp 60 mcg every 3 weeks.  Changing to every 4 weeks, 60 mcg.  INTERVAL HISTORY: [name] 70 y.o. female returns for followup of anemia of chronic renal disease, on ESA therapy every 3 weeks, but changing to every 4 weeks beginning today, 11/06/2015, based upon response and ESA needs.   I personally reviewed and went over laboratory results with the patient. The results are noted within this dictation.   Her HGB has responded extraordinarily well. She has only needed Aranesp 3 times over the past 5 months.   He appetite is stable. Her weight is stable as well.  She does not qualify for an Aranesp injection today.  She notes that she used to see nephrology but admits to not seeing Dr. Theador Hawthorne in quite some time. Her renal function is stable going back to August 2016, but she does require nephrology follow-up.  I've encouraged her to get in touch with her primary care physician regarding reestablishment of nephrology care.  Review of Systems  Constitutional: Negative for fever, chills, weight loss and malaise/fatigue.  HENT: Negative.   Eyes: Negative.   Respiratory: Negative.  Negative for hemoptysis.   Cardiovascular: Negative.  Negative for chest pain.  Gastrointestinal: Negative.  Negative for nausea, vomiting, abdominal pain, diarrhea and constipation.  Genitourinary: Negative.  Negative for dysuria, urgency and hematuria.  Musculoskeletal: Negative.  Negative for falls.  Skin: Negative.   Neurological: Positive for weakness.  Endo/Heme/Allergies: Negative.   Psychiatric/Behavioral: Negative.     Past Medical History  Diagnosis Date  . Cardiomyopathy, nonischemic ([place])     a. 1999 nl cath;  b. 12/05 Guidant B and E;  c. 10/2005 ICD extraction 2/2 enterococcus bacteremia and Veg on RV lead;  c. 05/2008 low risk Myoview (scarring w/ some evidence  of inf ischemia);  d. 11/2012 Echo: EF 15-20%;  e. 01/2013 s/p MDT [place] CRT D, ser # [id number];  f. 05/2013 Echo: EF 15%.  . Enterococcal infection     a. 10/2005 - AICD-explanted  . Pulmonary embolism ([place])     a. 07/2005 after total right hip arthroplasty  . Hilar density     a. infrahilar mass/adenopathy on CT scan 5/07; subsequently  resolved  . LBBB (left bundle branch block)   . GERD (gastroesophageal reflux disease)   . Hyperlipidemia   . Hypertension   . Tobacco abuse     a. discontinued in 1997, and then resumed  . Urinary incontinence   . Anemia     a. mild/chronic  . Villous adenoma of colon     a. tubovillous adenomatous polyp with focal high grade dysplasia; presented with hematochezia - followed by Dr. Laural Golden.  . Implantable cardioverter-defibrillator-CRT- Mdt     a.  01/2013 s/p MDT [place] CRT D, ser # [id number]  . Chronic systolic CHF (congestive heart failure) ([place])     a. 11/2012 Echo: EF 15-20%;  b. 05/2013 TEE EF 15%.  . Pneumonia 07/2005  . Obstructive sleep apnea     a. mild-did not tolerate CPAP (01/24/2013)  . History of blood transfusion   . Degenerative joint disease     of knees, shoulder, and hips  . PAF (paroxysmal atrial fibrillation) ([place])     a. 07/2012 s/p TEE/DCCV;  b. chronic coumadin;  c. 05/2013 Recurrent Afib->TEE/DCCV and amio initiation.  . CKD (chronic kidney disease), stage  III     creatinin-1.44 in 1/09; 1.51 in 1/10  . Type II diabetes mellitus ([place])     type 2  . ICD (implantable cardioverter-defibrillator) infection ([place])     removed 2016    Past Surgical History  Procedure Laterality Date  . Pacemaker removal  11/18/05    Enterococcal infection  . Total hip arthroplasty Right 07/2005  . Knee arthroscopy Right 1980's?  . Colonoscopy w/ polypectomy  2009  . Cardioversion N/A 08/20/2012    Procedure: TEE GUIDED CARDIOVERSION;  Surgeon: Yehuda Savannah, MD;  Location: [facility];  Service: Cardiovascular;  Laterality: N/A;  To be done @  bedside  . Tee without cardioversion N/A 08/20/2012    Procedure: TRANSESOPHAGEAL ECHOCARDIOGRAM (TEE);  Surgeon: Yehuda Savannah, MD;  Location: [facility];  Service: Cardiovascular;  Laterality: N/A;  . Bi-ventricular implantable cardioverter defibrillator  (crt-d)  01/24/2013  . A-v cardiac pacemaker insertion  12/05    Biventricular pacemaker/AICD  . Abdominal hysterectomy  1990/92    Initial partial hysterectomy followed by BSO  . Tubal ligation  1980's  . Cardiac catheterization    . Colonoscopy with esophagogastroduodenoscopy (egd) N/A 06/10/2013    Procedure: COLONOSCOPY WITH ESOPHAGOGASTRODUODENOSCOPY (EGD);  Surgeon: Rogene Houston, MD;  [facility]: [facility];  Service: Endoscopy;  Laterality: N/A;  925  . Tee without cardioversion N/A 06/20/2013    Procedure: TRANSESOPHAGEAL ECHOCARDIOGRAM (TEE);  Surgeon: Dorothy Spark, MD;  Location: [facility];  Service: Cardiovascular;  Laterality: N/A;  . Cardioversion N/A 06/20/2013    Procedure: CARDIOVERSION;  Surgeon: Dorothy Spark, MD;  Location: [facility] ENDOSCOPY;  Service: Cardiovascular;  Laterality: N/A;  . Bi-ventricular implantable cardioverter defibrillator N/A 01/24/2013    Procedure: BI-VENTRICULAR IMPLANTABLE CARDIOVERTER DEFIBRILLATOR  (CRT-D);  Surgeon: Evans Lance, MD;  Location: [facility] CATH LAB;  Service: Cardiovascular;  Laterality: N/A;  . Right heart catheterization N/A 04/18/2014    Procedure: RIGHT HEART CATH;  Surgeon: Larey Dresser, MD;  Location: [facility] CATH LAB;  Service: Cardiovascular;  Laterality: N/A;  . Esophagogastroduodenoscopy N/A 10/21/2014    Procedure: ESOPHAGOGASTRODUODENOSCOPY (EGD);  Surgeon: Inda Castle, MD;  Location: [facility];  Service: Endoscopy;  Laterality: N/A;  . Colonoscopy Left 10/24/2014    Procedure: COLONOSCOPY;  Surgeon: Carol Ada, MD;  Location: [facility] ENDOSCOPY;  Service: Endoscopy;  Laterality: Left;  Freda Munro capsule study N/A 10/24/2014    Procedure: GIVENS CAPSULE STUDY;  Surgeon:  Carol Ada, MD;  Location: [facility] ENDOSCOPY;  Service: Endoscopy;  Laterality: N/A;  . Tee without cardioversion N/A 12/08/2014    Procedure: TRANSESOPHAGEAL ECHOCARDIOGRAM (TEE);  Surgeon: Fay Records, MD;  [facility];  Service: Cardiovascular;  Laterality: N/A;  . Icd lead removal N/A 12/13/2014    Procedure: ICD LEAD REMOVAL/EXTRACTION ;  Surgeon: Evans Lance, MD;  Location: [place];  Service: Cardiovascular;  Laterality: N/A;  [place] back up  . Cataract extraction w/phaco Left 08/07/2015    Procedure: CATARACT EXTRACTION PHACO AND INTRAOCULAR LENS PLACEMENT (IOC);  Surgeon: Rutherford Guys, MD;  Location: [facility];  Service: Ophthalmology;  Laterality: Left;  CDE: 7.88  . Cataract extraction w/phaco Right 08/21/2015    Procedure: CATARACT EXTRACTION PHACO AND INTRAOCULAR LENS PLACEMENT (IOC);  Surgeon: Rutherford Guys, MD;  Location: [facility];  Service: Ophthalmology;  Laterality: Right;  CDE:8.55    Family History  Problem Relation Age of Onset  . Hypertension Mother   . Diabetes Mother   . Coronary artery disease Father   .  Diabetes Brother   . Hypertension Brother   . Lung cancer Brother   . Arthritis Other   . Diabetes Other   . Heart disease Other     female < 84    Social History   Social History  . Marital Status: Married    Spouse Name: N/A  . Number of Children: N/A  . Years of Education: N/A   Social History Main Topics  . Smoking status: Former Smoker -- 12 years    Types: Cigarettes  . Smokeless tobacco: Never Used     Comment: 05/2013: Smokes an occasional cigarette.  Says that she doesn't inhale.  . Alcohol Use: No  . Drug Use: No  . Sexual Activity: Not Currently    Birth Control/ Protection: None   Other Topics Concern  . None   Social History Narrative   Married, lives in [place] with spouse. Retired Secretary/administrator.      PHYSICAL EXAMINATION  ECOG PERFORMANCE STATUS: 1 - Symptomatic but completely ambulatory  Filed Vitals:   11/06/15 1051  BP:  96/47  Pulse: 60  Temp: 98 F (36.7 C)  Resp: 16    GENERAL:alert, no distress, well nourished, well developed, comfortable, cooperative, smiling and accompanied by her husband SKIN: skin color, texture, turgor are normal, no rashes or significant lesions HEAD: Normocephalic, No masses, lesions, tenderness or abnormalities EYES: normal, EOMI, Conjunctiva are pink and non-injected EARS: External ears normal OROPHARYNX:lips, buccal mucosa, and tongue normal and mucous membranes are moist  NECK: supple, trachea midline LYMPH:  no palpable lymphadenopathy BREAST:not examined LUNGS: clear to auscultation  HEART: regular rate & rhythm, no murmurs, S1 normal, S2 normal, S4 and 2/6,  LLSB systolic murmur.  S3 documented by cardiology but I can appreciate today on exam. ABDOMEN:abdomen soft, non-tender and normal bowel sounds BACK: Back symmetric, no curvature. EXTREMITIES:less then 2 second capillary refill, no skin discoloration, no cyanosis  NEURO: alert & oriented x 3 with fluent speech, no focal motor/sensory deficits, gait normal with a rolling walker   LABORATORY DATA: CBC    Component Value Date/Time   WBC 8.0 11/06/2015 0959   RBC 3.51* 11/06/2015 0959   RBC 2.52* 10/18/2014 0850   HGB 11.6* 11/06/2015 0959   HCT 34.2* 11/06/2015 0959   PLT 202 11/06/2015 0959   MCV 97.4 11/06/2015 0959   MCH 33.0 11/06/2015 0959   MCHC 33.9 11/06/2015 0959   RDW 12.7 11/06/2015 0959   LYMPHSABS 1.3 11/06/2015 0959   MONOABS 0.5 11/06/2015 0959   EOSABS 0.1 11/06/2015 0959   BASOSABS 0.0 11/06/2015 0959      Chemistry      Component Value Date/Time   NA 139 10/16/2015 1300   K 4.6 10/16/2015 1300   CL 104 10/16/2015 1300   CO2 25 10/16/2015 1300   BUN 51* 10/16/2015 1300   CREATININE 2.52* 10/16/2015 1300   CREATININE 1.17* 01/20/2013 1010      Component Value Date/Time   CALCIUM 9.4 10/16/2015 1300   ALKPHOS 66 02/06/2015 1224   AST 18 02/06/2015 1224   ALT 14 02/06/2015  1224   BILITOT 0.9 02/06/2015 1224        PENDING LABS:   RADIOGRAPHIC STUDIES:  No results found.   PATHOLOGY:    ASSESSMENT AND PLAN:  Anemia of chronic disease Anemia of chronic renal disease, currently on Aranesp 60 mcg every 3 weeks.  Due to her low need for ESA, will change therapy plan to Aranesp 60 mcg every 4 weeks.  Additionally, she requires IV iron support intermittently.  Oncology Flowsheet 07/10/2015 09/04/2015 10/16/2015  Darbepoetin Alfa (ARANESP) [place] 60 mcg 60 mcg 60 mcg   I personally reviewed and went over laboratory results with the patient.  The results are noted within this dictation.  Supportive therapy plan reviewed.  Labs today: CBC  Supportive therapy plan is reviewed.  Labs every 4 weeks: CBC  Labs every 90 days: Ferritin.    Continue same dose of Aranesp 60 mcg but will change the frequency to every 4 weeks.  New supportive therapy plan built to reflect changes.  Historically, she has needed Aranesp every 6-8 weeks.  Depending on her needs over the next 4 months, I would not hesitate to consider further spacing of Aranesp injections and lab appointments.  However, if her HGB is not maintained, will need to increase frequency again.  She is advised to touch base with her primary care provider as I would suspect she would benefit from continued nephrology follow-up and patient admits to not seeing nephrology in some time.  Renal function is stable going back to August 2016.  Return in 4 months for follow-up.    ORDERS PLACED FOR THIS ENCOUNTER: No orders of the defined types were placed in this encounter.    MEDICATIONS PRESCRIBED THIS ENCOUNTER: No orders of the defined types were placed in this encounter.    THERAPY PLAN:  Change supportive therapy plan to Aranesp 60 mcg every 4 weeks (compared to every 3 weeks).  All questions were answered. The patient knows to call the clinic with any problems, questions or concerns. We can certainly  see the patient much sooner if necessary.  Patient and plan discussed with Dr. Ancil Linsey and she is in agreement with the aforementioned.   This note is electronically signed by: Doy Mince 11/06/2015 3:21 PM

## 2015-11-05 NOTE — Assessment & Plan Note (Addendum)
Anemia of chronic renal disease, currently on Aranesp 60 mcg every 3 weeks.  Due to her low need for ESA, will change therapy plan to Aranesp 60 mcg every 4 weeks.  Additionally, she requires IV iron support intermittently.  Oncology Flowsheet 07/10/2015 09/04/2015 10/16/2015  Darbepoetin Alfa (ARANESP) [place] 60 mcg 60 mcg 60 mcg   I personally reviewed and went over laboratory results with the patient.  The results are noted within this dictation.  Supportive therapy plan reviewed.  Labs today: CBC  Supportive therapy plan is reviewed.  Labs every 4 weeks: CBC  Labs every 90 days: Ferritin.    Continue same dose of Aranesp 60 mcg but will change the frequency to every 4 weeks.  New supportive therapy plan built to reflect changes.  Historically, she has needed Aranesp every 6-8 weeks.  Depending on her needs over the next 4 months, I would not hesitate to consider further spacing of Aranesp injections and lab appointments.  However, if her HGB is not maintained, will need to increase frequency again.  She is advised to touch base with her primary care provider as I would suspect she would benefit from continued nephrology follow-up and patient admits to not seeing nephrology in some time.  Renal function is stable going back to August 2016.  Return in 4 months for follow-up.

## 2015-11-06 ENCOUNTER — Encounter (HOSPITAL_COMMUNITY): Payer: Commercial Managed Care - HMO | Attending: Hematology & Oncology | Admitting: Oncology

## 2015-11-06 ENCOUNTER — Encounter (HOSPITAL_COMMUNITY): Payer: Self-pay | Admitting: Oncology

## 2015-11-06 ENCOUNTER — Encounter (HOSPITAL_COMMUNITY): Payer: Commercial Managed Care - HMO

## 2015-11-06 VITALS — BP 96/47 | HR 60 | Temp 98.0°F | Resp 16 | Wt 165.3 lb

## 2015-11-06 DIAGNOSIS — N189 Chronic kidney disease, unspecified: Secondary | ICD-10-CM | POA: Insufficient documentation

## 2015-11-06 DIAGNOSIS — Z7901 Long term (current) use of anticoagulants: Secondary | ICD-10-CM | POA: Insufficient documentation

## 2015-11-06 DIAGNOSIS — D638 Anemia in other chronic diseases classified elsewhere: Secondary | ICD-10-CM

## 2015-11-06 DIAGNOSIS — D631 Anemia in chronic kidney disease: Secondary | ICD-10-CM | POA: Insufficient documentation

## 2015-11-06 LAB — CBC WITH DIFFERENTIAL/PLATELET
BASOS ABS: 0 10*3/uL (ref 0.0–0.1)
BASOS PCT: 0 %
EOS PCT: 1 %
Eosinophils Absolute: 0.1 10*3/uL (ref 0.0–0.7)
HEMATOCRIT: 34.2 % — AB (ref 36.0–46.0)
Hemoglobin: 11.6 g/dL — ABNORMAL LOW (ref 12.0–15.0)
LYMPHS ABS: 1.3 10*3/uL (ref 0.7–4.0)
LYMPHS PCT: 16 %
MCH: 33 pg (ref 26.0–34.0)
MCHC: 33.9 g/dL (ref 30.0–36.0)
MCV: 97.4 fL (ref 78.0–100.0)
MONOS PCT: 6 %
Monocytes Absolute: 0.5 10*3/uL (ref 0.1–1.0)
NEUTROS PCT: 78 %
Neutro Abs: 6.2 10*3/uL (ref 1.7–7.7)
PLATELETS: 202 10*3/uL (ref 150–400)
RBC: 3.51 MIL/uL — ABNORMAL LOW (ref 3.87–5.11)
RDW: 12.7 % (ref 11.5–15.5)
WBC: 8 10*3/uL (ref 4.0–10.5)

## 2015-11-06 LAB — FERRITIN: FERRITIN: 182 ng/mL (ref 11–307)

## 2015-11-06 NOTE — Patient Instructions (Signed)
[place] at [facility] Discharge Instructions  RECOMMENDATIONS MADE BY THE CONSULTANT AND ANY TEST RESULTS WILL BE SENT TO YOUR REFERRING PHYSICIAN.  HGB is excellent today at 11.6 g/dL.  You do not qualify for an Aranesp injection today. We will change your treatment plan to the following:  A. Labs every 4 weeks  B. Aranesp every 4 weeks (if lab meet treatment criteria). Follow-up with Dr. Karie Kirks as directed. You should see a Kidney Doctor for your renal function.  Please talk to Dr. Karie Kirks about this.  Your Kidney Function is stable compared to our records when compared to Feb 2017 and August 2016.   Return in 4 months for follow-up.  CBC    Component Value Date/Time   WBC 8.0 11/06/2015 0959   RBC 3.51* 11/06/2015 0959   RBC 2.52* 10/18/2014 0850   HGB 11.6* 11/06/2015 0959   HCT 34.2* 11/06/2015 0959   PLT 202 11/06/2015 0959   MCV 97.4 11/06/2015 0959   MCH 33.0 11/06/2015 0959   MCHC 33.9 11/06/2015 0959   RDW 12.7 11/06/2015 0959   LYMPHSABS 1.3 11/06/2015 0959   MONOABS 0.5 11/06/2015 0959   EOSABS 0.1 11/06/2015 0959   BASOSABS 0.0 11/06/2015 0959      Chemistry      Component Value Date/Time   NA 139 10/16/2015 1300   K 4.6 10/16/2015 1300   CL 104 10/16/2015 1300   CO2 25 10/16/2015 1300   BUN 51* 10/16/2015 1300   CREATININE 2.52* 10/16/2015 1300   CREATININE 1.17* 01/20/2013 1010      Component Value Date/Time   CALCIUM 9.4 10/16/2015 1300   ALKPHOS 66 02/06/2015 1224   AST 18 02/06/2015 1224   ALT 14 02/06/2015 1224   BILITOT 0.9 02/06/2015 1224     Lab Results  Component Value Date   FERRITIN 192 10/16/2015    Thank you for choosing [place] at [facility] to provide your oncology and hematology care.  To afford each patient quality time with our provider, please arrive at least 15 minutes before your scheduled appointment time.   Beginning January 23rd 2017 lab work for the [facility]  will be done in the  Main lab at [facility] on 1st floor. If you have a lab appointment with the [facility] please come in thru the  Main Entrance and check in at the main information desk  You need to re-schedule your appointment should you arrive 10 or more minutes late.  We strive to give you quality time with our providers, and arriving late affects you and other patients whose appointments are after yours.  Also, if you no show three or more times for appointments you may be dismissed from the clinic at the providers discretion.     Again, thank you for choosing [facility].  Our hope is that these requests will decrease the amount of time that you wait before being seen by our physicians.       _____________________________________________________________  Should you have questions after your visit to [facility], please contact our office at [phone] between the hours of 8:30 a.m. and 4:30 p.m.  Voicemails left after 4:30 p.m. will not be returned until the following business day.  For prescription refill requests, have your pharmacy contact our office.         Resources For Cancer Patients and their Caregivers ? American Cancer Society: Can assist with transportation,  wigs, general needs, runs Look Good Feel Better.        3101558205 ? Cancer Care: Provides financial assistance, online support groups, medication/co-pay assistance.  1-800-813-HOPE 240-506-0790) ? [place] Assists [place] cancer patients and their families through emotional , educational and financial support.  253 288 2867 ? [place] DSS Where to apply for food stamps, Medicaid and utility assistance. (662) 016-0514 ? RCATS: Transportation to medical appointments. 937-887-8294 ? Social Security Administration: May apply for disability if have a Stage IV cancer. 416-157-6632 907-836-0393 ? [employer], Disability and Transit  Services: Assists with nutrition, care and transit needs. [place] Support Programs: @10RELATIVEDAYS @ > Cancer Support Group  2nd Tuesday of the month 1pm-2pm, Journey Room  > Creative Journey  3rd Tuesday of the month 1130am-1pm, Journey Room  > Look Good Feel Better  1st Wednesday of the month 10am-12 noon, Journey Room (Call [place] to register [phone])

## 2015-11-06 NOTE — Progress Notes (Signed)
Patient doesn't need aranesp r/t lab results and treatment parameters.  Patient aware.

## 2015-11-15 ENCOUNTER — Other Ambulatory Visit: Payer: Self-pay | Admitting: Internal Medicine

## 2015-11-15 ENCOUNTER — Other Ambulatory Visit: Payer: Self-pay

## 2015-11-15 DIAGNOSIS — I4891 Unspecified atrial fibrillation: Secondary | ICD-10-CM

## 2015-11-15 MED ORDER — AMIODARONE HCL 200 MG PO TABS: 200.0000 mg | ORAL_TABLET | Freq: Every day | ORAL | Status: DC

## 2015-11-21 ENCOUNTER — Other Ambulatory Visit: Payer: Self-pay | Admitting: Internal Medicine

## 2015-11-21 ENCOUNTER — Other Ambulatory Visit (HOSPITAL_COMMUNITY): Payer: Self-pay | Admitting: Cardiology

## 2015-12-04 ENCOUNTER — Encounter (HOSPITAL_COMMUNITY): Payer: Medicare HMO

## 2015-12-04 ENCOUNTER — Encounter (HOSPITAL_COMMUNITY): Payer: Self-pay

## 2015-12-04 ENCOUNTER — Encounter (HOSPITAL_COMMUNITY): Payer: Medicare HMO | Attending: Hematology & Oncology

## 2015-12-04 VITALS — BP 106/44 | HR 54 | Resp 18

## 2015-12-04 DIAGNOSIS — N189 Chronic kidney disease, unspecified: Secondary | ICD-10-CM | POA: Insufficient documentation

## 2015-12-04 DIAGNOSIS — D631 Anemia in chronic kidney disease: Secondary | ICD-10-CM | POA: Diagnosis not present

## 2015-12-04 DIAGNOSIS — D638 Anemia in other chronic diseases classified elsewhere: Secondary | ICD-10-CM | POA: Diagnosis present

## 2015-12-04 LAB — CBC WITH DIFFERENTIAL/PLATELET
BASOS ABS: 0 10*3/uL (ref 0.0–0.1)
Basophils Relative: 0 %
EOS ABS: 0.1 10*3/uL (ref 0.0–0.7)
EOS PCT: 1 %
HCT: 29.5 % — ABNORMAL LOW (ref 36.0–46.0)
HEMOGLOBIN: 10.1 g/dL — AB (ref 12.0–15.0)
Lymphocytes Relative: 23 %
Lymphs Abs: 1.2 10*3/uL (ref 0.7–4.0)
MCH: 32.8 pg (ref 26.0–34.0)
MCHC: 34.2 g/dL (ref 30.0–36.0)
MCV: 95.8 fL (ref 78.0–100.0)
Monocytes Absolute: 0.4 10*3/uL (ref 0.1–1.0)
Monocytes Relative: 7 %
NEUTROS PCT: 69 %
Neutro Abs: 3.6 10*3/uL (ref 1.7–7.7)
PLATELETS: 176 10*3/uL (ref 150–400)
RBC: 3.08 MIL/uL — AB (ref 3.87–5.11)
RDW: 12.7 % (ref 11.5–15.5)
WBC: 5.3 10*3/uL (ref 4.0–10.5)

## 2015-12-04 LAB — FERRITIN: FERRITIN: 278 ng/mL (ref 11–307)

## 2015-12-04 MED ORDER — DARBEPOETIN ALFA 60 MCG/0.3ML IJ SOSY
PREFILLED_SYRINGE | INTRAMUSCULAR | Status: AC
  Filled 2015-12-04: qty 0.3

## 2015-12-04 MED ORDER — DARBEPOETIN ALFA 60 MCG/0.3ML IJ SOSY
60.0000 ug | PREFILLED_SYRINGE | Freq: Once | INTRAMUSCULAR | Status: AC
  Administered 2015-12-04: 60 ug via SUBCUTANEOUS

## 2015-12-04 NOTE — Progress Notes (Signed)
[name]'s reason for visit today is for an injection and labs as scheduled per MD orders.   [name] also received aranesp 60 mcg per MD orders; see [facility] for administration details.  [name] tolerated all procedures well and without incident; questions were answered and patient was discharged.

## 2015-12-04 NOTE — Patient Instructions (Signed)
[place] at [facility] Discharge Instructions  RECOMMENDATIONS MADE BY THE CONSULTANT AND ANY TEST RESULTS WILL BE SENT TO YOUR REFERRING PHYSICIAN.   Hemoglobin 10.1 aranesp 60 mcg today aranesp every 4 weeks  Follow up as scheduled  Please call the clinic if you have any questions or concerns    Thank you for choosing [place] at [facility] to provide your oncology and hematology care.  To afford each patient quality time with our provider, please arrive at least 15 minutes before your scheduled appointment time.   Beginning January 23rd 2017 lab work for the [facility] will be done in the  Main lab at [facility] on 1st floor. If you have a lab appointment with the [facility] please come in thru the  Main Entrance and check in at the main information desk  You need to re-schedule your appointment should you arrive 10 or more minutes late.  We strive to give you quality time with our providers, and arriving late affects you and other patients whose appointments are after yours.  Also, if you no show three or more times for appointments you may be dismissed from the clinic at the providers discretion.     Again, thank you for choosing [facility].  Our hope is that these requests will decrease the amount of time that you wait before being seen by our physicians.       _____________________________________________________________  Should you have questions after your visit to [facility], please contact our office at [phone] between the hours of 8:30 a.m. and 4:30 p.m.  Voicemails left after 4:30 p.m. will not be returned until the following business day.  For prescription refill requests, have your pharmacy contact our office.         Resources For Cancer Patients and their Caregivers ? American Cancer Society: Can assist with transportation, wigs, general needs, runs Look Good Feel Better.         (318)068-3797 ? Cancer Care: Provides financial assistance, online support groups, medication/co-pay assistance.  1-800-813-HOPE 512-775-7513) ? [place] Assists [place] cancer patients and their families through emotional , educational and financial support.  305-467-9110 ? [place] DSS Where to apply for food stamps, Medicaid and utility assistance. (937)253-4091 ? RCATS: Transportation to medical appointments. 234-337-4641 ? Social Security Administration: May apply for disability if have a Stage IV cancer. 407-392-2683 ([phone] 792-8991 ? [employer], Disability and Transit Services: Assists with nutrition, care and transit needs. [place] Support Programs: @10RELATIVEDAYS @ > Cancer Support Group  2nd Tuesday of the month 1pm-2pm, Journey Room  > Creative Journey  3rd Tuesday of the month 1130am-1pm, Journey Room  > Look Good Feel Better  1st Wednesday of the month 10am-12 noon, Journey Room (Call [place] to register [phone])

## 2015-12-17 ENCOUNTER — Other Ambulatory Visit: Payer: Self-pay | Admitting: Internal Medicine

## 2015-12-17 ENCOUNTER — Other Ambulatory Visit (HOSPITAL_COMMUNITY): Payer: Self-pay | Admitting: Cardiology

## 2015-12-27 ENCOUNTER — Other Ambulatory Visit: Payer: Self-pay | Admitting: Internal Medicine

## 2016-01-01 ENCOUNTER — Encounter (HOSPITAL_COMMUNITY): Payer: Medicare HMO | Attending: Hematology & Oncology

## 2016-01-01 ENCOUNTER — Encounter (HOSPITAL_COMMUNITY): Payer: Self-pay

## 2016-01-01 ENCOUNTER — Encounter (HOSPITAL_COMMUNITY): Payer: Commercial Managed Care - HMO | Attending: Hematology & Oncology

## 2016-01-01 VITALS — BP 101/48 | HR 51 | Temp 98.2°F

## 2016-01-01 DIAGNOSIS — D638 Anemia in other chronic diseases classified elsewhere: Secondary | ICD-10-CM | POA: Diagnosis not present

## 2016-01-01 DIAGNOSIS — N189 Chronic kidney disease, unspecified: Secondary | ICD-10-CM

## 2016-01-01 DIAGNOSIS — D631 Anemia in chronic kidney disease: Secondary | ICD-10-CM | POA: Diagnosis not present

## 2016-01-01 LAB — CBC WITH DIFFERENTIAL/PLATELET
BASOS ABS: 0 10*3/uL (ref 0.0–0.1)
BASOS PCT: 0 %
Eosinophils Absolute: 0 10*3/uL (ref 0.0–0.7)
Eosinophils Relative: 1 %
HEMATOCRIT: 30.2 % — AB (ref 36.0–46.0)
Hemoglobin: 9.9 g/dL — ABNORMAL LOW (ref 12.0–15.0)
Lymphocytes Relative: 24 %
Lymphs Abs: 1.2 10*3/uL (ref 0.7–4.0)
MCH: 31.8 pg (ref 26.0–34.0)
MCHC: 32.8 g/dL (ref 30.0–36.0)
MCV: 97.1 fL (ref 78.0–100.0)
MONO ABS: 0.3 10*3/uL (ref 0.1–1.0)
Monocytes Relative: 6 %
NEUTROS ABS: 3.6 10*3/uL (ref 1.7–7.7)
NEUTROS PCT: 69 %
Platelets: 163 10*3/uL (ref 150–400)
RBC: 3.11 MIL/uL — ABNORMAL LOW (ref 3.87–5.11)
RDW: 12.8 % (ref 11.5–15.5)
WBC: 5.2 10*3/uL (ref 4.0–10.5)

## 2016-01-01 MED ORDER — DARBEPOETIN ALFA 40 MCG/0.4ML IJ SOSY
PREFILLED_SYRINGE | INTRAMUSCULAR | Status: AC
  Filled 2016-01-01: qty 0.4

## 2016-01-01 MED ORDER — DARBEPOETIN ALFA 60 MCG/0.3ML IJ SOSY
60.0000 ug | PREFILLED_SYRINGE | Freq: Once | INTRAMUSCULAR | Status: AC
  Administered 2016-01-01: 60 ug via SUBCUTANEOUS

## 2016-01-01 NOTE — Progress Notes (Signed)
[name] presents today for injection per MD orders. Aranesp 79mcg administered SQ in left abdomen. Administration without incident. Patient tolerated well.   3 hemoccult cards given and orders placed. Pt instructed on what to do.

## 2016-01-10 DIAGNOSIS — D638 Anemia in other chronic diseases classified elsewhere: Secondary | ICD-10-CM | POA: Diagnosis not present

## 2016-01-10 LAB — OCCULT BLOOD X 1 CARD TO LAB, STOOL
FECAL OCCULT BLD: NEGATIVE
FECAL OCCULT BLD: NEGATIVE
Fecal Occult Bld: POSITIVE — AB

## 2016-01-23 ENCOUNTER — Other Ambulatory Visit: Payer: Self-pay | Admitting: Internal Medicine

## 2016-01-23 ENCOUNTER — Other Ambulatory Visit (HOSPITAL_COMMUNITY): Payer: Self-pay | Admitting: Cardiology

## 2016-01-23 NOTE — Telephone Encounter (Signed)
carvedilol (COREG) 3.125 MG tablet  Medication  Date: 12/18/2015 Department: [facility] Ordering/Authorizing: Larey Dresser, MD  Order Providers   Prescribing Provider Encounter Provider  Larey Dresser, MD Larey Dresser, MD  Medication Detail    Disp Refills Start End   carvedilol (COREG) 3.125 MG tablet 180 tablet 0 12/18/2015    Sig: TAKE 3 TABLETS BY MOUTH TWICE DAILY WITH A MEAL.   E-Prescribing Status: Receipt confirmed by pharmacy (12/18/2015 4:57 PM EDT)   Pharmacy   [place], [place]

## 2016-01-24 ENCOUNTER — Telehealth (HOSPITAL_COMMUNITY): Payer: Self-pay | Admitting: Hematology & Oncology

## 2016-01-24 NOTE — Telephone Encounter (Signed)
CALLED PT GET NEW INSURANCE INFO.  NOT HOME HAD TO LEAVE A MSG

## 2016-01-28 ENCOUNTER — Other Ambulatory Visit: Payer: Self-pay | Admitting: Internal Medicine

## 2016-01-28 DIAGNOSIS — I4891 Unspecified atrial fibrillation: Secondary | ICD-10-CM

## 2016-01-28 DIAGNOSIS — I4819 Other persistent atrial fibrillation: Secondary | ICD-10-CM

## 2016-01-28 DIAGNOSIS — I10 Essential (primary) hypertension: Secondary | ICD-10-CM

## 2016-01-29 ENCOUNTER — Encounter (HOSPITAL_COMMUNITY): Payer: Medicare HMO | Attending: Oncology

## 2016-01-29 ENCOUNTER — Encounter (HOSPITAL_COMMUNITY): Payer: Medicare HMO | Attending: Hematology & Oncology

## 2016-01-29 VITALS — BP 105/52 | HR 55 | Temp 97.8°F | Resp 20

## 2016-01-29 DIAGNOSIS — N189 Chronic kidney disease, unspecified: Secondary | ICD-10-CM | POA: Diagnosis not present

## 2016-01-29 DIAGNOSIS — D638 Anemia in other chronic diseases classified elsewhere: Secondary | ICD-10-CM

## 2016-01-29 DIAGNOSIS — D631 Anemia in chronic kidney disease: Secondary | ICD-10-CM | POA: Diagnosis not present

## 2016-01-29 LAB — CBC WITH DIFFERENTIAL/PLATELET
BASOS ABS: 0 10*3/uL (ref 0.0–0.1)
BASOS PCT: 0 %
EOS ABS: 0.1 10*3/uL (ref 0.0–0.7)
EOS PCT: 1 %
HCT: 30.5 % — ABNORMAL LOW (ref 36.0–46.0)
Hemoglobin: 10.1 g/dL — ABNORMAL LOW (ref 12.0–15.0)
LYMPHS PCT: 19 %
Lymphs Abs: 1.2 10*3/uL (ref 0.7–4.0)
MCH: 32.4 pg (ref 26.0–34.0)
MCHC: 33.1 g/dL (ref 30.0–36.0)
MCV: 97.8 fL (ref 78.0–100.0)
MONO ABS: 0.4 10*3/uL (ref 0.1–1.0)
Monocytes Relative: 7 %
Neutro Abs: 4.6 10*3/uL (ref 1.7–7.7)
Neutrophils Relative %: 73 %
PLATELETS: 181 10*3/uL (ref 150–400)
RBC: 3.12 MIL/uL — AB (ref 3.87–5.11)
RDW: 12.7 % (ref 11.5–15.5)
WBC: 6.3 10*3/uL (ref 4.0–10.5)

## 2016-01-29 MED ORDER — DARBEPOETIN ALFA 60 MCG/0.3ML IJ SOSY
60.0000 ug | PREFILLED_SYRINGE | Freq: Once | INTRAMUSCULAR | Status: AC
Start: 2016-01-29 — End: 2016-01-29
  Administered 2016-01-29: 60 ug via SUBCUTANEOUS
  Filled 2016-01-29: qty 0.3

## 2016-01-29 NOTE — Progress Notes (Signed)
Sherelyn Borgelt Freeberg's reason for visit today is for an injection and labs as scheduled per MD orders.  Labs were drawn prior to administration of ordered medication.  [name] also received Aranesp 60,052mcg for HGB of 10.1 per MD orders; see [facility] for administration details.  [name] tolerated all procedures well and without incident; questions were answered and patient was discharged self ambulatory using walker in satisfactory condition

## 2016-01-29 NOTE — Patient Instructions (Signed)
[facility] at [facility] Discharge Instructions  RECOMMENDATIONS MADE BY THE CONSULTANT AND ANY TEST RESULTS WILL BE SENT TO YOUR REFERRING PHYSICIAN.  Received Aranesp today. Follow-up as scheduled. Call clinic for any questions or concerns  Thank you for choosing [facility] at [facility] to provide your oncology and hematology care.  To afford each patient quality time with our provider, please arrive at least 15 minutes before your scheduled appointment time.   Beginning January 23rd 2017 lab work for the Cancer Center will be done in the  Main lab at [place] on 1st floor. If you have a lab appointment with the Cancer Center please come in thru the  Main Entrance and check in at the main information desk  You need to re-schedule your appointment should you arrive 10 or more minutes late.  We strive to give you quality time with our providers, and arriving late affects you and other patients whose appointments are after yours.  Also, if you no show three or more times for appointments you may be dismissed from the clinic at the providers discretion.     Again, thank you for choosing [facility].  Our hope is that these requests will decrease the amount of time that you wait before being seen by our physicians.       _____________________________________________________________  Should you have questions after your visit to [facility], please contact our office at [phone] between the hours of 8:30 a.m. and 4:30 p.m.  Voicemails left after 4:30 p.m. will not be returned until the following business day.  For prescription refill requests, have your pharmacy contact our office.         Resources For Cancer Patients and their Caregivers ? American Cancer Society: Can assist with transportation, wigs, general needs, runs Look Good Feel Better.        1-888-227-6333 ? Cancer Care: Provides financial  assistance, online support groups, medication/co-pay assistance.  1-800-813-HOPE (4673) ? [facility] Assists [place] cancer patients and their families through emotional , educational and financial support.  336-427-4357 ? [place] DSS Where to apply for food stamps, Medicaid and utility assistance. 336-342-1394 ? RCATS: Transportation to medical appointments. 336-347-2287 ? Social Security Administration: May apply for disability if have a Stage IV cancer. 336-342-7796 1-800-772-1213 ? [place] Aging, Disability and Transit Services: Assists with nutrition, care and transit needs. 336-349-2343  Cancer Center Support Programs: @[email or web address]@ > Cancer Support Group  2nd Tuesday of the month 1pm-2pm, Journey Room  > Creative Journey  3rd Tuesday of the month 1130am-1pm, Journey Room  > Look Good Feel Better  1st Wednesday of the month 10am-12 noon, Journey Room (Call American Cancer Society to register 1-800-395-5775)   

## 2016-02-20 ENCOUNTER — Encounter (HOSPITAL_COMMUNITY): Payer: Self-pay

## 2016-02-20 ENCOUNTER — Other Ambulatory Visit (HOSPITAL_COMMUNITY): Payer: Self-pay | Admitting: *Deleted

## 2016-02-20 ENCOUNTER — Ambulatory Visit (HOSPITAL_COMMUNITY)
Admission: RE | Admit: 2016-02-20 | Discharge: 2016-02-20 | Disposition: A | Discharge status: Home / Self Care | Payer: Medicare HMO | Source: Ambulatory Visit | Attending: Cardiology | Admitting: Cardiology

## 2016-02-20 ENCOUNTER — Telehealth (HOSPITAL_COMMUNITY): Payer: Self-pay | Admitting: *Deleted

## 2016-02-20 VITALS — BP 128/60 | HR 61 | Wt 166.8 lb

## 2016-02-20 DIAGNOSIS — I480 Paroxysmal atrial fibrillation: Secondary | ICD-10-CM | POA: Diagnosis not present

## 2016-02-20 DIAGNOSIS — Z79899 Other long term (current) drug therapy: Secondary | ICD-10-CM | POA: Insufficient documentation

## 2016-02-20 DIAGNOSIS — Z8249 Family history of ischemic heart disease and other diseases of the circulatory system: Secondary | ICD-10-CM | POA: Insufficient documentation

## 2016-02-20 DIAGNOSIS — G4733 Obstructive sleep apnea (adult) (pediatric): Secondary | ICD-10-CM | POA: Diagnosis not present

## 2016-02-20 DIAGNOSIS — D649 Anemia, unspecified: Secondary | ICD-10-CM | POA: Diagnosis not present

## 2016-02-20 DIAGNOSIS — N184 Chronic kidney disease, stage 4 (severe): Secondary | ICD-10-CM | POA: Diagnosis not present

## 2016-02-20 DIAGNOSIS — I5022 Chronic systolic (congestive) heart failure: Secondary | ICD-10-CM | POA: Diagnosis not present

## 2016-02-20 DIAGNOSIS — E039 Hypothyroidism, unspecified: Secondary | ICD-10-CM | POA: Diagnosis not present

## 2016-02-20 DIAGNOSIS — Z87891 Personal history of nicotine dependence: Secondary | ICD-10-CM | POA: Diagnosis not present

## 2016-02-20 DIAGNOSIS — I4891 Unspecified atrial fibrillation: Secondary | ICD-10-CM

## 2016-02-20 DIAGNOSIS — Z7901 Long term (current) use of anticoagulants: Secondary | ICD-10-CM | POA: Diagnosis not present

## 2016-02-20 DIAGNOSIS — I428 Other cardiomyopathies: Secondary | ICD-10-CM | POA: Insufficient documentation

## 2016-02-20 DIAGNOSIS — N189 Chronic kidney disease, unspecified: Secondary | ICD-10-CM | POA: Diagnosis not present

## 2016-02-20 DIAGNOSIS — I130 Hypertensive heart and chronic kidney disease with heart failure and stage 1 through stage 4 chronic kidney disease, or unspecified chronic kidney disease: Secondary | ICD-10-CM | POA: Diagnosis not present

## 2016-02-20 DIAGNOSIS — I5042 Chronic combined systolic (congestive) and diastolic (congestive) heart failure: Secondary | ICD-10-CM

## 2016-02-20 LAB — COMPREHENSIVE METABOLIC PANEL
ALBUMIN: 3.8 g/dL (ref 3.5–5.0)
ALK PHOS: 69 U/L (ref 38–126)
ALT: 14 U/L (ref 14–54)
AST: 15 U/L (ref 15–41)
Anion gap: 11 (ref 5–15)
BILIRUBIN TOTAL: 0.9 mg/dL (ref 0.3–1.2)
BUN: 52 mg/dL — AB (ref 6–20)
CALCIUM: 9.8 mg/dL (ref 8.9–10.3)
CO2: 24 mmol/L (ref 22–32)
Chloride: 101 mmol/L (ref 101–111)
Creatinine, Ser: 2.97 mg/dL — ABNORMAL HIGH (ref 0.44–1.00)
GFR calc Af Amer: 17 mL/min — ABNORMAL LOW (ref >60)
GFR calc non Af Amer: 15 mL/min — ABNORMAL LOW (ref >60)
GLUCOSE: 271 mg/dL — AB (ref 65–99)
Potassium: 4.9 mmol/L (ref 3.5–5.1)
SODIUM: 136 mmol/L (ref 135–145)
TOTAL PROTEIN: 7.2 g/dL (ref 6.5–8.1)

## 2016-02-20 LAB — DIGOXIN LEVEL: Digoxin Level: 0.6 ng/mL — ABNORMAL LOW (ref 0.8–2.0)

## 2016-02-20 MED ORDER — CARVEDILOL 6.25 MG PO TABS: 6.2500 mg | ORAL_TABLET | Freq: Two times a day (BID) | ORAL | 3 refills | Status: DC

## 2016-02-20 MED ORDER — AMIODARONE HCL 200 MG PO TABS: 100.0000 mg | ORAL_TABLET | Freq: Every day | ORAL | 3 refills | Status: DC

## 2016-02-20 NOTE — Progress Notes (Signed)
Patient ID: [name], female   DOB: February 25, 1946, 70 y.o.   MRN: [id number] PCP: Dr. Karie Kirks HF Cardiology: Dr. Aundra Dubin  70 yo with history of chronic systolic CHF from nonischemic cardiomyopathy, prior PE, CKD, and paroxysmal atrial fibrillation presents for CHF clinic followup.  She has a long history of cardiomyopathy, dating back to 1999.  At that time, she had coronary angiography showing no significant disease. She was initially followed by Dr Lattie Haw, later by Dr. Lovena Le. She had her initial CRT-D device in 2005.  This was removed in 2007 due to enterococcal bacteremia with vegetation.  She had Medtronic CRT- D device placed in 8/14.  She has had trouble with paroxysmal atrial fibrillation, and she was put on amiodarone and cardioverted in 12/14.  TEE in 12/14 showed EF 15% but RV appeared normal.   Had RHC on 04/18/14 for possible low output. This showed volume depletion with normal cardiac output. Lasix held for a few days then cut back from 80 bid to 40 bid. Lisinopril also held but has been restarted.   Admitted with anemia 6/9 through 12/02/2014. Received 1UPRBCs. Diuresed with IV lasix. She had follow up with hematology for Epogen injections. Discharge weight was 152 pounds.   She was admitted later in 6/16, this time with enterococcal bacteremia and pacemaker lead vegetation by TEE.  Her CRT-D device was again extracted.  Her device remains out and she is still getting antibiotics via PICC.   She was admitted to [facility] in 7/16 with PNA and treated with abx.  She returns for cardiology followup, has not been seen in this office for about a year.  She seems stable. Walks with a walker outside and a cane in the house. No dyspnea walking on flat ground.  No orthopnea/PND. Gets out to stores a couple of times a week.  Occasional nonexertional chest tightness, once or twice a month.  She does not think that she feels any different since losing BiV pacing.  No palpitations.  She is in NSR today.  Still not using CPAP.   Labs (7/15): K 3.5, creatinine 1.78, hemoglobin 9.4,m BNP 24607 Labs (10/15): K 4.7, creatinine 2.3, digoxin 0.7, BNP 8376, HCT 30.9 Labs (04/18/14): K 4.3 creatinine 2.8  Labs (11/15): K 5.3, creatinine 1.9 Labs (10/26/2014) K 3.7 Creatinine 1.94 Hgb 9.3, LFTs normal Labs (11/2014): K 3.6 Creatinine 1.80=>1.65, digoxin 0.5 Labs (7/16): hgb 9 Labs (01/22/15): K 4.0 creatinine 1.67 Labs (8/17): K 4.6, creatinine 2.52, hgb 10.1  ECG: NSR, 1st degree AVB, LBBB 202 msec   PMH:  1. Chronic systolic CHF: Nonischemic cardiomyopathy.  1999 had LHC with normal coronaries.  12/05 had Guidant BiV ICD placed. 5/07 had enterococcal bacteremia with ICD lead vegetation, device extracted.  8/14 had placement of Medtronic BiV ICD device.  Cardiolite (12/09) with multiple wall segments showing scarring, some inferior ischemia.  Echo (6/14) with EF 15-20%, diffuse hypokinesis with some regionality, normal RV size and systolic function.  TEE (12/14) with EF 15%, normal RV size and systolic function. RHC (10/15) with mean RA 1, PA 27/8, mean PCWP 5, CI 2.71. Enterococcal bacteremia 6/16 with CRT-D extraction.  Echo (4/16) with EF 15-20%, moderate LVH, diffuse hypokinesis, mild-moderate AI, mild MR, PASP 33 mmHg.  2. PE: 2/07 after right THR.  3. Right THR 4. LBBB 5. GERD 6. Hyperlipidemia 7. HTN 8. Anemia: 12/14 had EGD that was normal, colonoscopy with 3 polyps removed.  9. CKD 10. OSA: Sleep study 9/16 with moderate to severe OSA.  11. Atrial fibrillation: Paroxysmal.  TEE-guided DCCV in 2/14, TEE-guided DCCV in 12/14 on amiodarone.  12. Enterococcal bacteremia with CRT-D device removal in 5/07 then again in 6/16.    SH: Married, lives in [place], previous smoker.   FH: Mother with MI.   ROS: All systems reviewed and negative except as per HPI.   Current Outpatient Prescriptions  Medication Sig Dispense Refill  . ACCU-CHEK AVIVA PLUS test strip     . amiodarone (PACERONE) 200  MG tablet Take 0.5 tablets (100 mg total) by mouth daily. 90 tablet 3  . carvedilol (COREG) 6.25 MG tablet Take 1 tablet (6.25 mg total) by mouth 2 (two) times daily with a meal. 60 tablet 3  . colchicine 0.6 MG tablet Take 0.6 mg by mouth 2 (two) times daily as needed (gout). Reported on 11/06/2015    . feeding supplement, ENSURE ENLIVE, (ENSURE ENLIVE) LIQD Take 237 mLs by mouth 2 (two) times daily between meals. 237 mL 12  . ferrous sulfate 324 (65 FE) MG TBEC Take 1 tablet (325 mg total) by mouth 2 (two) times daily. 60 tablet   . furosemide (LASIX) 40 MG tablet TAKE 1 TABLET BY MOUTH TWICE DAILY. 60 tablet 3  . glimepiride (AMARYL) 2 MG tablet Take 2 mg by mouth daily.    . isosorbide mononitrate (IMDUR) 30 MG 24 hr tablet Take 1 tablet (30 mg total) by mouth daily. 30 tablet 0  . levothyroxine (SYNTHROID, LEVOTHROID) 25 MCG tablet Take 1 tablet (25 mcg total) by mouth daily before breakfast. 30 tablet 0  . lisinopril (PRINIVIL,ZESTRIL) 10 MG tablet TAKE ONE TABLET BY MOUTH DAILY. 30 tablet 3  . metoCLOPramide (REGLAN) 5 MG tablet Take 5 mg by mouth 2 (two) times daily.     . ondansetron (ZOFRAN) 4 MG tablet Take 4 mg by mouth every 4 (four) hours as needed for nausea or vomiting. Reported on 11/06/2015    . oxyCODONE-acetaminophen (PERCOCET) 10-325 MG per tablet Take 1 tablet by mouth every 6 (six) hours as needed for pain.     . polyethylene glycol (MIRALAX / GLYCOLAX) packet Take 17 g by mouth daily as needed for mild constipation.    . pravastatin (PRAVACHOL) 40 MG tablet Take 80 mg by mouth at bedtime.     Marland Kitchen spironolactone (ALDACTONE) 25 MG tablet Take 0.5 tablets (12.5 mg total) by mouth daily. 45 tablet 3  . warfarin (COUMADIN) 2.5 MG tablet Take 0.5-1 tablets (1.25-2.5 mg total) by mouth daily at 6 PM. Takes 1 tablet on Mon, Wed, Fri, take 0.5 tablet on all other days     No current facility-administered medications for this encounter.     BP 128/60   Pulse 61   Wt 166 lb 12 oz  (75.6 kg)   SpO2 97%   BMI 27.75 kg/m  General: NAD. Husband present.  Neck: JVP 6 cm, no thyromegaly or thyroid nodule.  Lungs: Clear to auscultation bilaterally with normal respiratory effort. CV: Nondisplaced PMI.  Heart regular S1/S2, no S3/S4, 1/6 early SEM RUSB.  No ankle edema.  No carotid bruit.  Normal pedal pulses.  Abdomen: Soft, nontender, no hepatosplenomegaly, no distention.  Skin: Intact without lesions or rashes.  Neurologic: Alert and oriented x 3.  Psych: Normal affect. Extremities: No clubbing or cyanosis.   HEENT: Normal.   Assessment/Plan: 1. Chronic systolic CHF: Patient has a nonischemic cardiomyopathy. Echo EF 15-20% on 5/16 echo.  RHC (10/15) showed low filling pressures and normal cardiac output.She is unable  to do CPX testing and probably would not be a good LVAD candidate.  NYHA II-III symptoms but not very active. Volume status stable. She had removal of CRT-D system for the 2nd time due to endocarditis in 6/16, symptoms seem about the same.  - Continue current dose of lasix.  - Increase Coreg to 6.25 mg bid and decrease amiodarone to 100 mg daily.  - Continue digoxin for now and check level today. Will also get BMET, low threshold to stop digoxin with any rise in creatinine.  - Continue spironolactone and lisinopril at current doses. - Would not replace CRT-D system at this point given 2 episodes of endocarditis now with device explantation.  She also is not interested in replacing the device.  - Echo to reassess EF.  2. Atrial fibrillation: Paroxysmal, in NSR.   - Decrease amiodarone to 100 mg daily. Check LFTs today, should have regular eye exams.  Hypothyroid getting Levoxyl (followed by PCP).  - She has been on coumadin with history of anemia. No definite GI bleeding source noted.Goal 2-2.5. Followed by PCP.  3. CKD: Follow creatinine closely. BMET today.  4. Anemia: She had EGD and c-scope in 12/14. In 12/15, she had transfusion but apparently  no-showed for her GI workup. Seen by hematology earlier this year, suspect normocytic anemia is most likely due to combination of CKD and hypothyroidism, less likely MDS. Erythropoeitin started. She has had 5 units PRBCs in 5/16 and 1 unit in 6/16. Per GI: Iron deficiency anemia with blood in stool, few scattered lymphangiectasias noted on the small bowel capsule study but no source of blood loss identified. Small lesions could be missed as she had some debris in the small bowel. Continue aranesp injections every 3 weeks.  If she has recurrent bleed can consider stopping coumadin.  5. OSA: Moderate to severe OSA on sleep study.  Still waiting for CPAP, will contact Dr Radford Pax.  Followup in 2 months.    Loralie Champagne MD 02/20/2016

## 2016-02-20 NOTE — Patient Instructions (Signed)
Increase Carvedilol to 6.25 mg Twice daily   Decrease Amiodarone to 100 mg (1/2 tab) daily  Labs today  Your physician has requested that you have an echocardiogram. Echocardiography is a painless test that uses sound waves to create images of your heart. It provides your doctor with information about the size and shape of your heart and how well your heart's chambers and valves are working. This procedure takes approximately one hour. There are no restrictions for this procedure.  We will contact you in 2 months to schedule your next appointment.

## 2016-02-20 NOTE — Telephone Encounter (Signed)
Notes Recorded by Harvie Junior, CMA on 02/20/2016 at 4:20 PM EDT Patient aware. Medication updated in patients chart.   ------  Notes Recorded by Larey Dresser, MD on 02/20/2016 at 3:55 PM EDT Creatinine higher than before. Even though digoxin level is within normal range, I think that we need to stop it due to risk of toxicity with renal dysfunction.    Ref Range & Units 10:15 50mo ago 69mo ago   Sodium 135 - 145 mmol/L 136 139 138   Potassium 3.5 - 5.1 mmol/L 4.9 4.6 4.7   Chloride 101 - 111 mmol/L 101 104 104   CO2 22 - 32 mmol/L 24 25 24    Glucose, Bld 65 - 99 mg/dL 271  223  273    BUN 6 - 20 mg/dL 52  51  60    Creatinine, Ser 0.44 - 1.00 mg/dL 2.97  2.52  2.56    Calcium 8.9 - 10.3 mg/dL 9.8 9.4 9.4   Total Protein 6.5 - 8.1 g/dL 7.2     Albumin 3.5 - 5.0 g/dL 3.8     AST 15 - 41 U/L 15     ALT 14 - 54 U/L 14     Alkaline Phosphatase 38 - 126 U/L 69     Total Bilirubin 0.3 - 1.2 mg/dL 0.9     GFR calc non Af Amer >60 mL/min 15  18  18     GFR calc Af Amer >60 mL/min 17  21CM  21CM

## 2016-02-21 ENCOUNTER — Other Ambulatory Visit: Payer: Self-pay | Admitting: Internal Medicine

## 2016-02-21 ENCOUNTER — Other Ambulatory Visit (HOSPITAL_COMMUNITY): Payer: Self-pay | Admitting: *Deleted

## 2016-02-21 DIAGNOSIS — D638 Anemia in other chronic diseases classified elsewhere: Secondary | ICD-10-CM

## 2016-02-26 ENCOUNTER — Encounter (HOSPITAL_COMMUNITY): Payer: Medicare HMO | Attending: Hematology & Oncology

## 2016-02-26 ENCOUNTER — Encounter (HOSPITAL_COMMUNITY): Payer: Commercial Managed Care - HMO | Attending: Oncology | Admitting: Oncology

## 2016-02-26 ENCOUNTER — Encounter (HOSPITAL_COMMUNITY): Payer: Self-pay | Admitting: Oncology

## 2016-02-26 ENCOUNTER — Encounter (HOSPITAL_COMMUNITY): Payer: Commercial Managed Care - HMO

## 2016-02-26 VITALS — BP 114/54 | HR 52 | Temp 98.7°F | Resp 16 | Ht 65.0 in | Wt 166.0 lb

## 2016-02-26 DIAGNOSIS — D638 Anemia in other chronic diseases classified elsewhere: Secondary | ICD-10-CM | POA: Diagnosis present

## 2016-02-26 DIAGNOSIS — N189 Chronic kidney disease, unspecified: Secondary | ICD-10-CM | POA: Diagnosis not present

## 2016-02-26 DIAGNOSIS — D631 Anemia in chronic kidney disease: Secondary | ICD-10-CM | POA: Diagnosis not present

## 2016-02-26 DIAGNOSIS — D500 Iron deficiency anemia secondary to blood loss (chronic): Secondary | ICD-10-CM

## 2016-02-26 HISTORY — DX: Iron deficiency anemia secondary to blood loss (chronic): D50.0

## 2016-02-26 LAB — CBC WITH DIFFERENTIAL/PLATELET
BASOS ABS: 0 10*3/uL (ref 0.0–0.1)
BASOS PCT: 0 %
Eosinophils Absolute: 0 10*3/uL (ref 0.0–0.7)
Eosinophils Relative: 1 %
HEMATOCRIT: 32.3 % — AB (ref 36.0–46.0)
Hemoglobin: 10.9 g/dL — ABNORMAL LOW (ref 12.0–15.0)
LYMPHS PCT: 26 %
Lymphs Abs: 1.6 10*3/uL (ref 0.7–4.0)
MCH: 32.4 pg (ref 26.0–34.0)
MCHC: 33.7 g/dL (ref 30.0–36.0)
MCV: 96.1 fL (ref 78.0–100.0)
Monocytes Absolute: 0.3 10*3/uL (ref 0.1–1.0)
Monocytes Relative: 4 %
NEUTROS ABS: 4.2 10*3/uL (ref 1.7–7.7)
NEUTROS PCT: 69 %
Platelets: 177 10*3/uL (ref 150–400)
RBC: 3.36 MIL/uL — AB (ref 3.87–5.11)
RDW: 12.2 % (ref 11.5–15.5)
WBC: 6.1 10*3/uL (ref 4.0–10.5)

## 2016-02-26 LAB — COMPREHENSIVE METABOLIC PANEL
ALT: 13 U/L — ABNORMAL LOW (ref 14–54)
AST: 15 U/L (ref 15–41)
Albumin: 4.1 g/dL (ref 3.5–5.0)
Alkaline Phosphatase: 71 U/L (ref 38–126)
Anion gap: 9 (ref 5–15)
BUN: 50 mg/dL — ABNORMAL HIGH (ref 6–20)
CHLORIDE: 99 mmol/L — AB (ref 101–111)
CO2: 28 mmol/L (ref 22–32)
Calcium: 9.4 mg/dL (ref 8.9–10.3)
Creatinine, Ser: 2.78 mg/dL — ABNORMAL HIGH (ref 0.44–1.00)
GFR, EST AFRICAN AMERICAN: 18 mL/min — AB (ref >60)
GFR, EST NON AFRICAN AMERICAN: 16 mL/min — AB (ref >60)
Glucose, Bld: 258 mg/dL — ABNORMAL HIGH (ref 65–99)
POTASSIUM: 4.8 mmol/L (ref 3.5–5.1)
Sodium: 136 mmol/L (ref 135–145)
Total Bilirubin: 0.8 mg/dL (ref 0.3–1.2)
Total Protein: 7.4 g/dL (ref 6.5–8.1)

## 2016-02-26 LAB — FERRITIN: FERRITIN: 192 ng/mL (ref 11–307)

## 2016-02-26 MED ORDER — DARBEPOETIN ALFA 60 MCG/0.3ML IJ SOSY
PREFILLED_SYRINGE | INTRAMUSCULAR | Status: AC
  Filled 2016-02-26: qty 0.3

## 2016-02-26 MED ORDER — DARBEPOETIN ALFA 60 MCG/0.3ML IJ SOSY
60.0000 ug | PREFILLED_SYRINGE | Freq: Once | INTRAMUSCULAR | Status: AC
  Administered 2016-02-26: 60 ug via SUBCUTANEOUS

## 2016-02-26 NOTE — Patient Instructions (Signed)
[facility] at [facility] Discharge Instructions  RECOMMENDATIONS MADE BY THE CONSULTANT AND ANY TEST RESULTS WILL BE SENT TO YOUR REFERRING PHYSICIAN.  You were given an Aranesp injection today.  Return as scheduled.   Thank you for choosing [facility] at [facility] to provide your oncology and hematology care.  To afford each patient quality time with our provider, please arrive at least 15 minutes before your scheduled appointment time.   Beginning January 23rd 2017 lab work for the Cancer Center will be done in the  Main lab at [place] on 1st floor. If you have a lab appointment with the Cancer Center please come in thru the  Main Entrance and check in at the main information desk  You need to re-schedule your appointment should you arrive 10 or more minutes late.  We strive to give you quality time with our providers, and arriving late affects you and other patients whose appointments are after yours.  Also, if you no show three or more times for appointments you may be dismissed from the clinic at the providers discretion.     Again, thank you for choosing [facility].  Our hope is that these requests will decrease the amount of time that you wait before being seen by our physicians.       _____________________________________________________________  Should you have questions after your visit to  Cancer Center, please contact our office at [phone] between the hours of 8:30 a.m. and 4:30 p.m.  Voicemails left after 4:30 p.m. will not be returned until the following business day.  For prescription refill requests, have your pharmacy contact our office.         Resources For Cancer Patients and their Caregivers ? American Cancer Society: Can assist with transportation, wigs, general needs, runs Look Good Feel Better.        1-888-227-6333 ? Cancer Care: Provides financial assistance, online support  groups, medication/co-pay assistance.  1-800-813-HOPE (4673) ? [facility] Assists [place] cancer patients and their families through emotional , educational and financial support.  336-427-4357 ? [place] DSS Where to apply for food stamps, Medicaid and utility assistance. 336-342-1394 ? RCATS: Transportation to medical appointments. 336-347-2287 ? Social Security Administration: May apply for disability if have a Stage IV cancer. 336-342-7796 1-800-772-1213 ? [place] Aging, Disability and Transit Services: Assists with nutrition, care and transit needs. 336-349-2343  Cancer Center Support Programs: @10RELATIVEDAYS@ > Cancer Support Group  2nd Tuesday of the month 1pm-2pm, Journey Room  > Creative Journey  3rd Tuesday of the month 1130am-1pm, Journey Room  > Look Good Feel Better  1st Wednesday of the month 10am-12 noon, Journey Room (Call American Cancer Society to register 1-800-395-5775)    

## 2016-02-26 NOTE — Assessment & Plan Note (Addendum)
Anemia of GI blood loss with 1/3 + stool cards.  Previously seen by Dr. Carol Ada.

## 2016-02-26 NOTE — Patient Instructions (Signed)
[place] at [facility] Discharge Instructions  RECOMMENDATIONS MADE BY THE CONSULTANT AND ANY TEST RESULTS WILL BE SENT TO YOUR REFERRING PHYSICIAN.  You were seen by Gershon Mussel today Labs every 4 weeks Aranesp every 4 weeks Return to center for follow up in 4 months.   Thank you for choosing [place] at [facility] to provide your oncology and hematology care.  To afford each patient quality time with our provider, please arrive at least 15 minutes before your scheduled appointment time.   Beginning January 23rd 2017 lab work for the [facility] will be done in the  Main lab at [facility] on 1st floor. If you have a lab appointment with the [facility] please come in thru the  Main Entrance and check in at the main information desk  You need to re-schedule your appointment should you arrive 10 or more minutes late.  We strive to give you quality time with our providers, and arriving late affects you and other patients whose appointments are after yours.  Also, if you no show three or more times for appointments you may be dismissed from the clinic at the providers discretion.     Again, thank you for choosing [facility].  Our hope is that these requests will decrease the amount of time that you wait before being seen by our physicians.       _____________________________________________________________  Should you have questions after your visit to [facility], please contact our office at [phone] between the hours of 8:30 a.m. and 4:30 p.m.  Voicemails left after 4:30 p.m. will not be returned until the following business day.  For prescription refill requests, have your pharmacy contact our office.         Resources For Cancer Patients and their Caregivers ? American Cancer Society: Can assist with transportation, wigs, general needs, runs Look Good Feel Better.        670-151-2099 ? Cancer  Care: Provides financial assistance, online support groups, medication/co-pay assistance.  1-800-813-HOPE (838)605-6068) ? [facility] Assists [place] cancer patients and their families through emotional , educational and financial support.  623-530-1257 ? [place] DSS Where to apply for food stamps, Medicaid and utility assistance. 5040215366 ? RCATS: Transportation to medical appointments. 301 728 4964 ? Social Security Administration: May apply for disability if have a Stage IV cancer. 313-542-0760 279-815-3789 ? [employer], Disability and Transit Services: Assists with nutrition, care and transit needs. [place] Support Programs: @10RELATIVEDAYS @ > Cancer Support Group  2nd Tuesday of the month 1pm-2pm, Journey Room  > Creative Journey  3rd Tuesday of the month 1130am-1pm, Journey Room  > Look Good Feel Better  1st Wednesday of the month 10am-12 noon, Journey Room (Call [place] to register [phone])

## 2016-02-26 NOTE — Progress Notes (Signed)
Robert Bellow, MD [place] [address]  Anemia of chronic disease - Plan: CBC with Differential, CBC with Differential, Comprehensive metabolic panel, Ferritin  Anemia due to GI blood loss - Plan: CBC with Differential, Ferritin  CURRENT THERAPY: Aranesp 60 mcg every 4 weeks.    INTERVAL HISTORY: [name] 70 y.o. female returns for followup of anemia of chronic renal disease, on ESA therapy every 4 weeks.  She is doing well.  She continues to demonstrate a good response to therapy with Aranesp.    I have educated her on 1/3 positive stools cards.  I have recommended GI referral, but at this time, she would rather decline this referral.  She is seeing a nephrologist.  She denies any blood in her stool that is visible.  She denies any dark, sticky stools.  Review of Systems  Constitutional: Negative.  Negative for chills, fever, malaise/fatigue and weight loss.  HENT: Negative.   Eyes: Negative.   Respiratory: Negative.  Negative for hemoptysis.   Cardiovascular: Negative.  Negative for chest pain.  Gastrointestinal: Negative.  Negative for abdominal pain, blood in stool, constipation, diarrhea, melena, nausea and vomiting.  Genitourinary: Negative.  Negative for dysuria, hematuria and urgency.  Musculoskeletal: Negative.  Negative for falls.  Skin: Negative.   Neurological: Negative.   Endo/Heme/Allergies: Negative.   Psychiatric/Behavioral: Negative.     Past Medical History:  Diagnosis Date  . Anemia    a. mild/chronic  . Anemia due to GI blood loss 02/26/2016  . Cardiomyopathy, nonischemic ([place])    a. 1999 nl cath;  b. 12/05 Guidant [place];  c. 10/2005 ICD extraction 2/2 enterococcus bacteremia and Veg on RV lead;  c. 05/2008 low risk Myoview (scarring w/ some evidence of inf ischemia);  d. 11/2012 Echo: EF 15-20%;  e. 01/2013 s/p MDT [place] CRT D, ser # [id number];  f. 05/2013 Echo: EF 15%.  . Chronic systolic CHF (congestive heart  failure) ([place])    a. 11/2012 Echo: EF 15-20%;  b. 05/2013 TEE EF 15%.  . CKD (chronic kidney disease), stage III    creatinin-1.44 in 1/09; 1.51 in 1/10  . Degenerative joint disease    of knees, shoulder, and hips  . Enterococcal infection    a. 10/2005 - AICD-explanted  . GERD (gastroesophageal reflux disease)   . Hilar density    a. infrahilar mass/adenopathy on CT scan 5/07; subsequently  resolved  . History of blood transfusion   . Hyperlipidemia   . Hypertension   . ICD (implantable cardioverter-defibrillator) infection ([place])    removed 2016  . Implantable cardioverter-defibrillator-CRT- Mdt    a.  01/2013 s/p MDT [place] XT CRT D, ser # [id number]  . LBBB (left bundle branch block)   . Obstructive sleep apnea    a. mild-did not tolerate CPAP (01/24/2013)  . PAF (paroxysmal atrial fibrillation) ([place])    a. 07/2012 s/p TEE/DCCV;  b. chronic coumadin;  c. 05/2013 Recurrent Afib->TEE/DCCV and amio initiation.  . Pneumonia 07/2005  . Pulmonary embolism ([place])    a. 07/2005 after total right hip arthroplasty  . Tobacco abuse    a. discontinued in 1997, and then resumed  . Type II diabetes mellitus ([place])    type 2  . Urinary incontinence   . Villous adenoma of colon    a. tubovillous adenomatous polyp with focal high grade dysplasia; presented with hematochezia - followed by Dr. Laural Golden.    Past Surgical History:  Procedure Laterality Date  . A-V CARDIAC PACEMAKER INSERTION  12/05   Biventricular pacemaker/AICD  . ABDOMINAL HYSTERECTOMY  1990/92   Initial partial hysterectomy followed by BSO  . BI-VENTRICULAR IMPLANTABLE CARDIOVERTER DEFIBRILLATOR N/A 01/24/2013   Procedure: BI-VENTRICULAR IMPLANTABLE CARDIOVERTER DEFIBRILLATOR  (CRT-D);  Surgeon: Evans Lance, MD;  Location: [facility] CATH LAB;  Service: Cardiovascular;  Laterality: N/A;  . BI-VENTRICULAR IMPLANTABLE CARDIOVERTER DEFIBRILLATOR  (CRT-D)  01/24/2013  . CARDIAC CATHETERIZATION    . CARDIOVERSION N/A 08/20/2012   Procedure: TEE  GUIDED CARDIOVERSION;  Surgeon: Yehuda Savannah, MD;  Location: [facility] OR[facility];  Service: Cardiovascular;  Laterality: N/A;  To be done @ bedside  . CARDIOVERSION N/A 06/20/2013   Procedure: CARDIOVERSION;  Surgeon: Dorothy Spark, MD;  Location: [place];  Service: Cardiovascular;  Laterality: N/A;  . CATARACT EXTRACTION W/PHACO Left 08/07/2015   Procedure: CATARACT EXTRACTION PHACO AND INTRAOCULAR LENS PLACEMENT ([place]);  Surgeon: Rutherford Guys, MD;  Location: [facility];  Service: Ophthalmology;  Laterality: Left;  CDE: 7.88  . CATARACT EXTRACTION W/PHACO Right 08/21/2015   Procedure: CATARACT EXTRACTION PHACO AND INTRAOCULAR LENS PLACEMENT (IOC);  Surgeon: Rutherford Guys, MD;  Location: [facility];  Service: Ophthalmology;  Laterality: Right;  CDE:8.55  . COLONOSCOPY Left 10/24/2014   Procedure: COLONOSCOPY;  Surgeon: Carol Ada, MD;  Location[facility] ENDOSCOPY;  Service: Endoscopy;  Laterality: Left;  . COLONOSCOPY W/ POLYPECTOMY  2009  . COLONOSCOPY WITH ESOPHAGOGASTRODUODENOSCOPY (EGD) N/A 06/10/2013   Procedure: COLONOSCOPY WITH ESOPHAGOGASTRODUODENOSCOPY (EGD);  Surgeon: Rogene Houston, MD;  [facility];  Service: Endoscopy;  Laterality: N/A;  925  . ESOPHAGOGASTRODUODENOSCOPY N/A 10/21/2014   Procedure: ESOPHAGOGASTRODUODENOSCOPY (EGD);  Surgeon: Inda Castle, MD;  Location: [place];  Service: Endoscopy;  Laterality: N/A;  . GIVENS CAPSULE STUDY N/A 10/24/2014   Procedure: GIVENS CAPSULE STUDY;  Surgeon: Carol Ada, MD;  Location: [facility] [place];  Service: Endoscopy;  Laterality: N/A;  . ICD LEAD REMOVAL N/A 12/13/2014   Procedure: ICD LEAD REMOVAL/EXTRACTION ;  Surgeon: Evans Lance, MD;  Location: [place];  Service: Cardiovascular;  Laterality: N/A;  [place] back up  . KNEE ARTHROSCOPY Right 1980's?  Marland Kitchen PACEMAKER REMOVAL  11/18/05   Enterococcal infection  . RIGHT HEART CATHETERIZATION N/A 04/18/2014   Procedure: RIGHT HEART CATH;  Surgeon: Larey Dresser, MD;  Location: [facility] CATH LAB;   Service: Cardiovascular;  Laterality: N/A;  . TEE WITHOUT CARDIOVERSION N/A 08/20/2012   Procedure: TRANSESOPHAGEAL ECHOCARDIOGRAM (TEE);  Surgeon: Yehuda Savannah, MD;  Location: [facility];  Service: Cardiovascular;  Laterality: N/A;  . TEE WITHOUT CARDIOVERSION N/A 06/20/2013   Procedure: TRANSESOPHAGEAL ECHOCARDIOGRAM (TEE);  Surgeon: Dorothy Spark, MD;  Location: [place];  Service: Cardiovascular;  Laterality: N/A;  . TEE WITHOUT CARDIOVERSION N/A 12/08/2014   Procedure: TRANSESOPHAGEAL ECHOCARDIOGRAM (TEE);  Surgeon: Fay Records, MD;  [facility];  Service: Cardiovascular;  Laterality: N/A;  . TOTAL HIP ARTHROPLASTY Right 07/2005  . TUBAL LIGATION  1980's    Family History  Problem Relation Age of Onset  . Hypertension Mother   . Diabetes Mother   . Coronary artery disease Father   . Diabetes Brother   . Hypertension Brother   . Lung cancer Brother   . Arthritis Other   . Diabetes Other   . Heart disease Other     female < 33    Social History   Social History  . Marital status: Married    Spouse name: N/A  . Number of  children: N/A  . Years of education: N/A   Social History Main Topics  . Smoking status: Former Smoker    Years: 12.00    Types: Cigarettes  . Smokeless tobacco: Never Used     Comment: 05/2013: Smokes an occasional cigarette.  Says that she doesn't inhale.  . Alcohol use No  . Drug use: No  . Sexual activity: Not Currently    Birth control/ protection: None   Other Topics Concern  . None   Social History Narrative   Married, lives in [place] with spouse. Retired Secretary/administrator.      PHYSICAL EXAMINATION  ECOG PERFORMANCE STATUS: 1 - Symptomatic but completely ambulatory  Vitals:   02/26/16 1434  BP: (!) 114/54  Pulse: (!) 52  Resp: 16  Temp: 98.7 F (37.1 C)    GENERAL:alert, no distress, well nourished, well developed, comfortable, cooperative, smiling and accompanied by her husband SKIN: skin color, texture,  turgor are normal, no rashes or significant lesions HEAD: Normocephalic, No masses, lesions, tenderness or abnormalities EYES: normal, EOMI, Conjunctiva are pink and non-injected EARS: External ears normal OROPHARYNX:lips, buccal mucosa, and tongue normal and mucous membranes are moist  NECK: supple, trachea midline LYMPH:  no palpable lymphadenopathy BREAST:not examined LUNGS: clear to auscultation  HEART: regular rate & rhythm, no murmurs, S1 normal, S2 normal, S4 and 2/6,  LLSB systolic murmur.   ABDOMEN:abdomen soft, non-tender and normal bowel sounds BACK: Back symmetric, no curvature. EXTREMITIES:less then 2 second capillary refill, no skin discoloration, no cyanosis  NEURO: alert & oriented x 3 with fluent speech, no focal motor/sensory deficits, gait normal with a rolling walker   LABORATORY DATA: CBC    Component Value Date/Time   WBC 6.1 02/26/2016 1347   RBC 3.36 (L) 02/26/2016 1347   HGB 10.9 (L) 02/26/2016 1347   HCT 32.3 (L) 02/26/2016 1347   PLT 177 02/26/2016 1347   MCV 96.1 02/26/2016 1347   MCH 32.4 02/26/2016 1347   MCHC 33.7 02/26/2016 1347   RDW 12.2 02/26/2016 1347   LYMPHSABS 1.6 02/26/2016 1347   MONOABS 0.3 02/26/2016 1347   EOSABS 0.0 02/26/2016 1347   BASOSABS 0.0 02/26/2016 1347      Chemistry      Component Value Date/Time   NA 136 02/26/2016 1347   K 4.8 02/26/2016 1347   CL 99 (L) 02/26/2016 1347   CO2 28 02/26/2016 1347   BUN 50 (H) 02/26/2016 1347   CREATININE 2.78 (H) 02/26/2016 1347   CREATININE 1.17 (H) 01/20/2013 1010      Component Value Date/Time   CALCIUM 9.4 02/26/2016 1347   ALKPHOS 71 02/26/2016 1347   AST 15 02/26/2016 1347   ALT 13 (L) 02/26/2016 1347   BILITOT 0.8 02/26/2016 1347        PENDING LABS:   RADIOGRAPHIC STUDIES:  No results found.   PATHOLOGY:    ASSESSMENT AND PLAN:  Anemia of chronic disease Anemia of chronic renal disease, currently on Aranesp 60 mcg every 4 weeks, due to her low need  for ESA.  Oncology Flowsheet 12/04/2015 01/01/2016 01/29/2016  Darbepoetin Alfa (ARANESP) [place] 60 mcg 60 mcg 60 mcg    Labs today: CBC diff, CMET, ferritin.  I personally reviewed and went over laboratory results with the patient.  The results are noted within this dictation.  In July, an acute drop in HGB was noted and therefore stool cards x 3 were performed.  1/3 was positive for occult blood.   She refuses GI referral  at this time, but if HGB drops again and stool cards are positive, she is agreeable to referral at that time.   Supportive therapy plan is reviewed.  Labs every 4 weeks: CBC  Labs every 90 days: Ferritin.    Labs in 4 months: CMET.  Continue same dose of Aranesp 60 mcg every 4 weeks.  Return in 4 months for follow-up.  Anemia due to GI blood loss Anemia of GI blood loss with 1/3 + stool cards.  Previously seen by Dr. Carol Ada.   ORDERS PLACED FOR THIS ENCOUNTER: Orders Placed This Encounter  Procedures  . CBC with Differential  . CBC with Differential  . Comprehensive metabolic panel  . Ferritin    MEDICATIONS PRESCRIBED THIS ENCOUNTER: Meds ordered this encounter  Medications  . DISCONTD: carvedilol (COREG) 3.125 MG tablet  . DISCONTD: calcitRIOL (ROCALTROL) 0.25 MCG capsule    THERAPY PLAN:  Change supportive therapy plan to Aranesp 60 mcg every 4 weeks (compared to every 3 weeks).  All questions were answered. The patient knows to call the clinic with any problems, questions or concerns. We can certainly see the patient much sooner if necessary.  Patient and plan discussed with Dr. Ancil Linsey and she is in agreement with the aforementioned.   This note is electronically signed by: Doy Mince 02/26/2016 6:15 PM

## 2016-02-26 NOTE — Assessment & Plan Note (Addendum)
Anemia of chronic renal disease, currently on Aranesp 60 mcg every 4 weeks, due to her low need for ESA.  Oncology Flowsheet 12/04/2015 01/01/2016 01/29/2016  Darbepoetin Alfa (ARANESP) [place] 60 mcg 60 mcg 60 mcg    Labs today: CBC diff, CMET, ferritin.  I personally reviewed and went over laboratory results with the patient.  The results are noted within this dictation.  In July, an acute drop in HGB was noted and therefore stool cards x 3 were performed.  1/3 was positive for occult blood.   She refuses GI referral at this time, but if HGB drops again and stool cards are positive, she is agreeable to referral at that time.   Supportive therapy plan is reviewed.  Labs every 4 weeks: CBC  Labs every 90 days: Ferritin.    Labs in 4 months: CMET.  Continue same dose of Aranesp 60 mcg every 4 weeks.  Return in 4 months for follow-up.

## 2016-02-26 NOTE — Progress Notes (Signed)
Pt here today for Aranesp injection. Pt given injection in right lower abdomen. Pt tolerated well. Pt stable and discharged home ambulatory with her husband.

## 2016-03-06 ENCOUNTER — Ambulatory Visit (HOSPITAL_COMMUNITY): Payer: Medicare HMO | Attending: Cardiology

## 2016-03-06 ENCOUNTER — Other Ambulatory Visit: Payer: Self-pay

## 2016-03-06 DIAGNOSIS — I340 Nonrheumatic mitral (valve) insufficiency: Secondary | ICD-10-CM | POA: Insufficient documentation

## 2016-03-06 DIAGNOSIS — I272 Other secondary pulmonary hypertension: Secondary | ICD-10-CM | POA: Insufficient documentation

## 2016-03-06 DIAGNOSIS — I5042 Chronic combined systolic (congestive) and diastolic (congestive) heart failure: Secondary | ICD-10-CM | POA: Insufficient documentation

## 2016-03-06 DIAGNOSIS — I517 Cardiomegaly: Secondary | ICD-10-CM | POA: Insufficient documentation

## 2016-03-06 LAB — ECHOCARDIOGRAM COMPLETE
AO mean calculated velocity dopler: 146 cm/s
AOVTI: 44.1 cm
AV Area VTI: 1.95 cm2
AV Mean grad: 9 mmHg
AV Peak grad: 17 mmHg
AV VEL mean LVOT/AV: 0.51
AV area mean vel ind: 1.05 cm2/m2
AV peak Index: 1.07
AVA: 2.19 cm2
AVAREAMEANV: 1.92 cm2
AVAREAVTIIND: 1.2 cm2/m2
AVPHT: 539 ms
AVPKVEL: 208 cm/s
Ao pk vel: 0.51 m/s
CHL CUP AV VEL: 2.19
CHL CUP MV DEC (S): 454
E decel time: 454 msec
E/e' ratio: 21.16
FS: 11 % — AB (ref 28–44)
IVS/LV PW RATIO, ED: 0.64
LA ID, A-P, ES: 51 mm
LA diam index: 2.79 cm/m2
LA vol index: 59.6 mL/m2
LA vol: 109 mL
LAVOLA4C: 105 mL
LEFT ATRIUM END SYS DIAM: 51 mm
LV TDI E'LATERAL: 3.37
LVEEAVG: 21.16
LVEEMED: 21.16
LVELAT: 3.37 cm/s
LVOT VTI: 25.4 cm
LVOT area: 3.8 cm2
LVOT diameter: 22 mm
LVOT peak VTI: 0.58 cm
LVOTPV: 107 cm/s
LVOTSV: 97 mL
MV pk A vel: 81.5 m/s
MVPG: 2 mmHg
MVPKEVEL: 71.3 m/s
PW: 12.2 mm — AB (ref 0.6–1.1)
RV TAPSE: 21.4 mm
Reg peak vel: 295 cm/s
TDI e' medial: 4.1
TR max vel: 295 cm/s
Valve area index: 1.2

## 2016-03-14 ENCOUNTER — Telehealth: Payer: Self-pay | Admitting: Cardiology

## 2016-03-14 DIAGNOSIS — G4733 Obstructive sleep apnea (adult) (pediatric): Secondary | ICD-10-CM

## 2016-03-14 IMAGING — US IR US GUIDE VASC ACCESS LEFT
1 series · 1 of 1 positions shown · IV contrast (agent unspecified)
Comparison: none

INDICATION: Poor venous access. In need of durable intravenous access for
antibiotic administration. Patient with history of prior left
anterior chest wall pacemaker as well as recent removal of right
anterior chest wall pacemaker due to infection.

EXAM:
ULTRASOUND AND FLUOROSCOPIC GUIDED MIDLINE PICC INSERTION
MEDICATIONS:
None.
CONTRAST:  None
FLUOROSCOPY TIME:  1 minute (47.9 mGy)
COMPLICATIONS:
None immediate
TECHNIQUE: The procedure, risks, benefits, and alternatives were explained to
the patient and informed written consent was obtained. A timeout was
performed prior to the initiation of the procedure.

[Series 1: ir fluoro/shunt/fist · 1 of 1 slices shown]
[im 1/1]
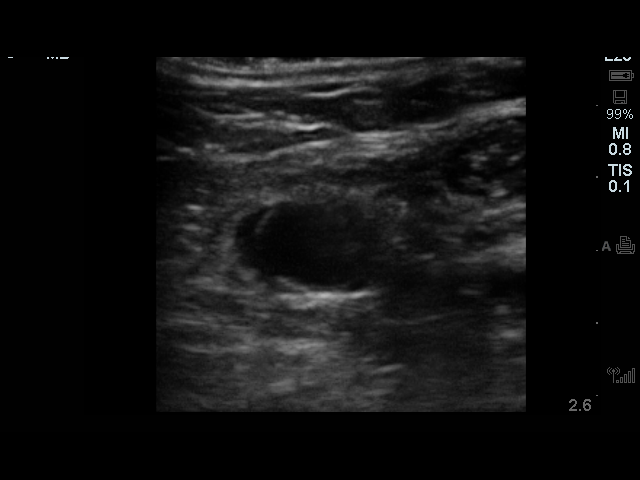

[1 of 1 positions shown; findings below may reference images not displayed]

Given the presence of recently removed right anterior chest wall
pacemaker, decision was made to place a left upper extremity
approach PICC line.

As such, the left upper extremity was prepped with chlorhexidine in
a sterile fashion, and a sterile drape was applied covering the
operative field. Maximum barrier sterile technique with sterile
gowns and gloves were used for the procedure. A timeout was
performed prior to the initiation of the procedure. Local anesthesia
was provided with 1% lidocaine.

Under direct ultrasound guidance, the left basilic vein was accessed
with a micropuncture kit after the overlying soft tissues were
anesthetized with 1% lidocaine. An ultrasound image was saved for
documentation purposes. As was difficulty advancing the guidewire
beyond the level of the mid left subclavian vein, a central venogram
was performed demonstrated complete occlusion of the left subclavian
vein at the level of the middle third of the clavicle. As such,
decision was made to place a midline PICC. A guidewire was use for
measurement purposes and the PICC line was cut to length. A
peel-away sheath was placed and a 23 cm, 5 French, dual lumen was
inserted to level of the left axilla. A post procedure spot
fluoroscopic was obtained. The catheter easily aspirated and flushed
and was sutured in place. A dressing was placed. The patient
tolerated the procedure well without immediate post procedural
complication.
FINDINGS: Left upper extremity central venogram demonstrates complete
occlusion within mid aspects of the left subclavian vein at the
level of the middle third of the clavicle.

After catheter placement, the tip lies within the left axilla. The
catheter aspirates and flushes normally and is ready for immediate
use.
IMPRESSION: 1. Successful ultrasound and fluoroscopic guided placement of a left
basilic vein approach, 23 cm, 5 French, dual lumen PICC with tip
within the left axilla. The PICC line is ready for immediate use.
2. Left upper extremity central venogram demonstrates complete
occlusion of the mid aspect of the left subclavian vein at the level
the middle third of the clavicle. Would recommend utilizing the
right upper extremity for future PICC line placement.

## 2016-03-14 NOTE — Telephone Encounter (Signed)
New message      Calling to let Dr Radford Pax know pt never used her CPAP machine.  Dr Karie Kirks want pt to be put back on CPAP machine.  Please call pt to get this started

## 2016-03-18 ENCOUNTER — Other Ambulatory Visit (HOSPITAL_COMMUNITY): Payer: Self-pay | Admitting: Cardiology

## 2016-03-18 NOTE — Telephone Encounter (Signed)
Patient's primary care and Dr. Aundra Dubin have both urged patient to get back on CPAP.   Where she was non-compliant before, I have let her know that she will have to start the process over again and do a new sleep study.   Patient was unsure about going back for sleep study, but she is aware that is the only way she will be able to get another machine.  I will send her information to the sleep lab for scheduling.

## 2016-03-25 ENCOUNTER — Other Ambulatory Visit (HOSPITAL_COMMUNITY): Payer: Medicare HMO

## 2016-03-25 ENCOUNTER — Ambulatory Visit (HOSPITAL_COMMUNITY): Payer: Medicare HMO

## 2016-03-27 IMAGING — XA IR FLUORO GUIDE CV LINE*R*
1 series · 2 of 2 positions shown · IV contrast (agent unspecified)
Comparison: none

INDICATION: Needs central venous access for antibiotics for infective
endocarditis.

EXAM:
ULTRASOUND AND FLUOROSCOPIC GUIDED PICC LINE INSERTION
MEDICATIONS:
None.
CONTRAST:  None
FLUOROSCOPY TIME:  4 minutes, 30 seconds.
COMPLICATIONS:
None immediate
TECHNIQUE: The procedure, risks, benefits, and alternatives were explained to
the patient and informed written consent was obtained. A timeout was
performed prior to the initiation of the procedure.

[Series 1: run · 2 of 2 slices shown]
[im 1/2]
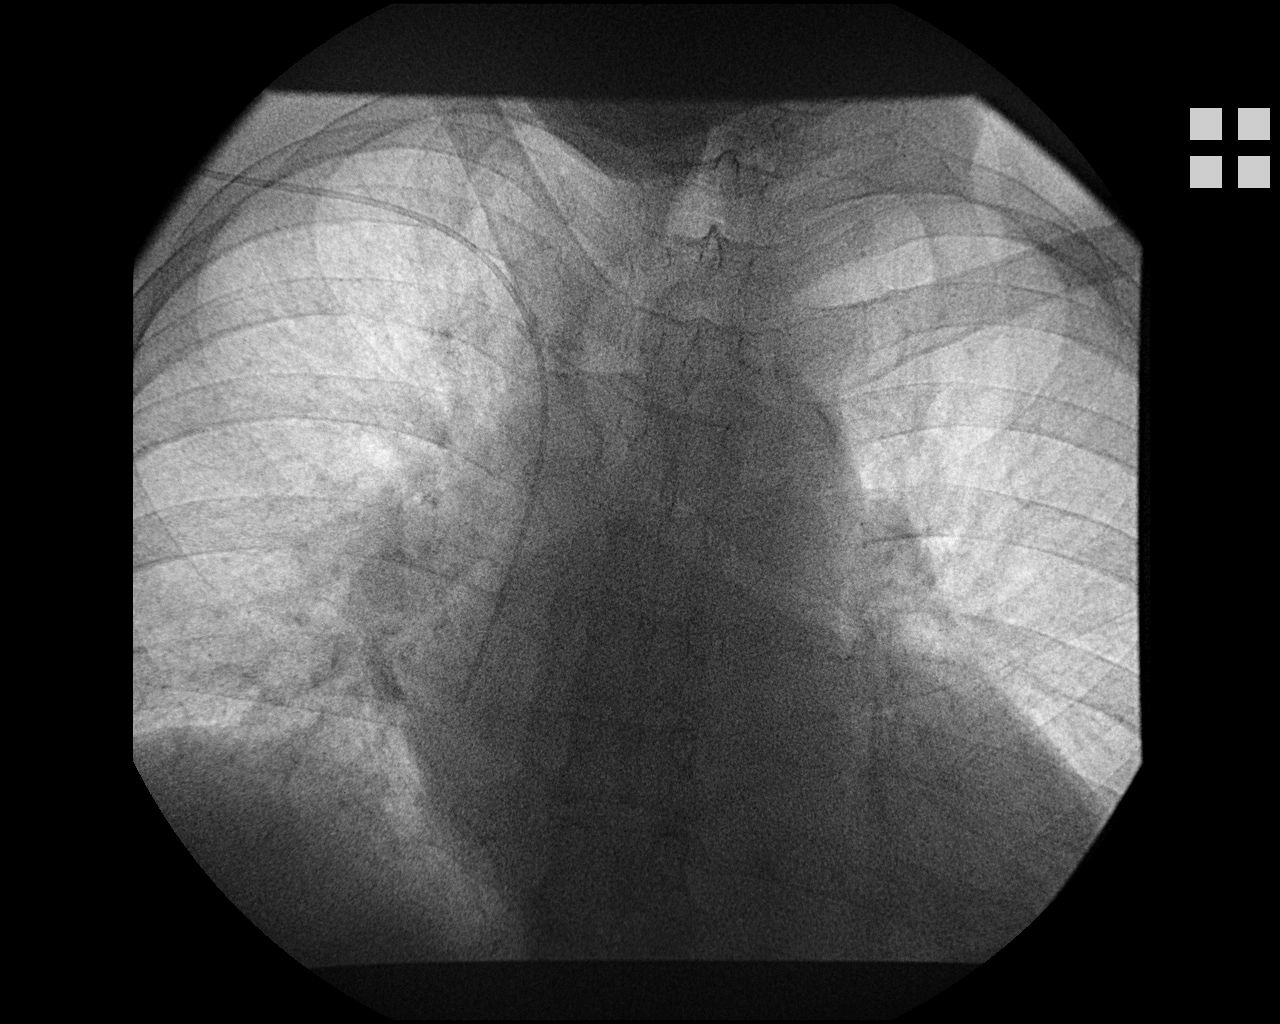
[im 2/2]
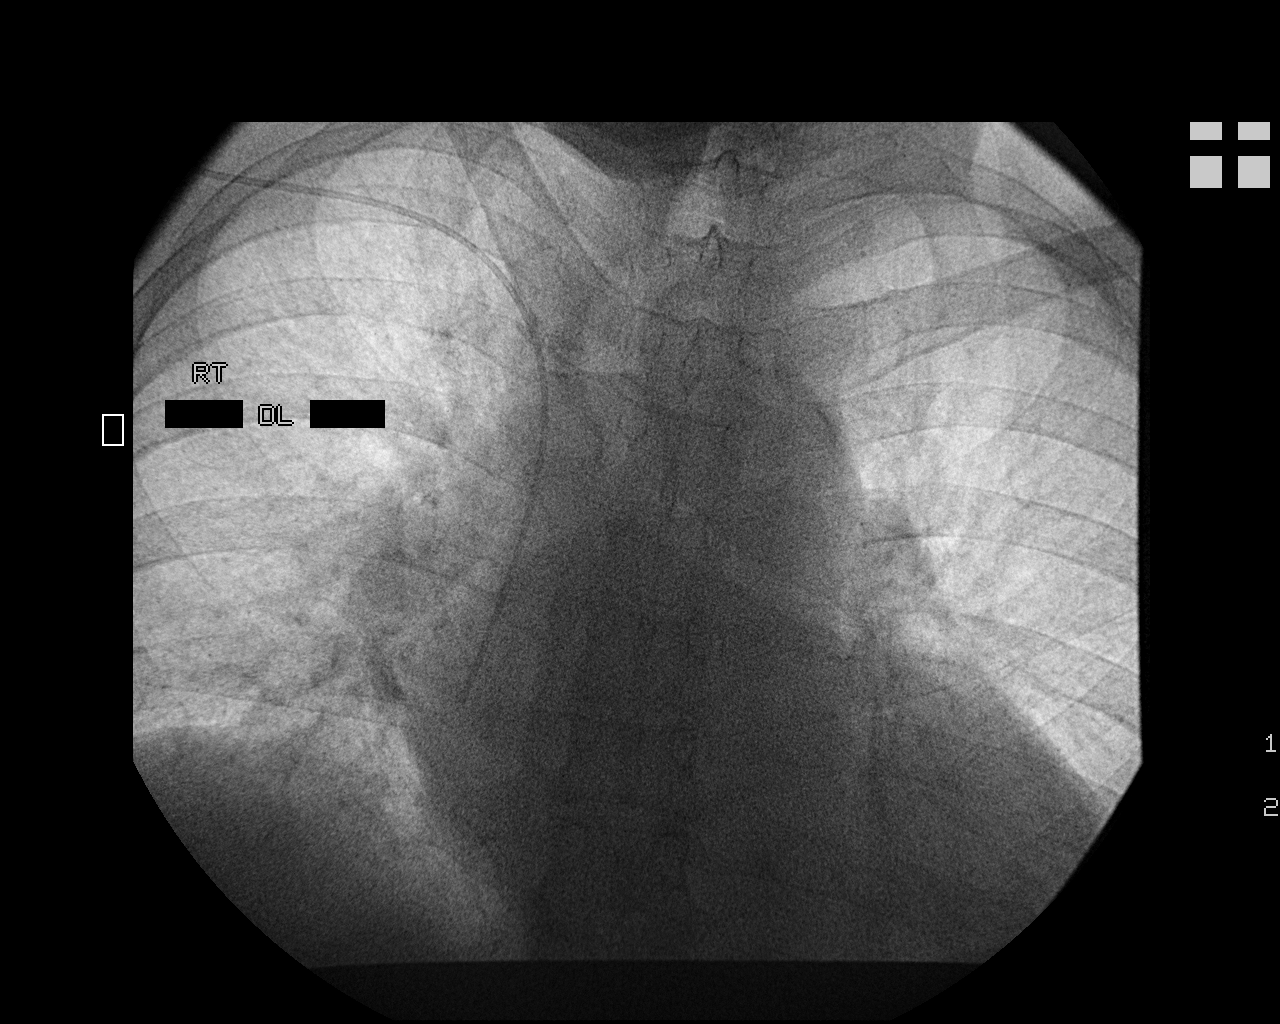

[2 of 2 positions shown; findings below may reference images not displayed]

The right upper extremity was prepped with chlorhexidine in a
sterile fashion, and a sterile drape was applied covering the
operative field. Maximum barrier sterile technique with sterile
gowns and gloves were used for the procedure. A timeout was
performed prior to the initiation of the procedure. Local anesthesia
was provided with 1% lidocaine.

Under direct ultrasound guidance, the right basilic vein was
accessed with a micropuncture kit after the overlying soft tissues
were anesthetized with 1% lidocaine. An ultrasound image was saved
for documentation purposes. A guidewire was advanced to the level of
the superior caval-atrial junction for measurement purposes and the
PICC line was cut to length. A peel-away sheath was placed and a 46
cm, 5 French, dual lumen was inserted to level of the superior
caval-atrial junction. A post procedure spot fluoroscopic was
obtained. The catheter easily aspirated and flushed and was sutured
in place. A dressing was placed. The patient tolerated the procedure
well without immediate post procedural complication.
FINDINGS: After catheter placement, the tip lies within the superior
cavoatrial junction. The catheter aspirates and flushes normally and
is ready for immediate use.
IMPRESSION: Successful ultrasound and fluoroscopic guided placement of a right
basilic vein approach, 46 cm, 5 French, dual lumen PICC with tip at
the superior caval-atrial junction. The PICC line is ready for
immediate use.

## 2016-03-31 ENCOUNTER — Encounter (HOSPITAL_COMMUNITY): Payer: Medicare HMO

## 2016-03-31 ENCOUNTER — Encounter (HOSPITAL_COMMUNITY): Payer: Medicare HMO | Attending: Oncology

## 2016-03-31 VITALS — BP 93/53 | HR 62 | Temp 98.2°F | Resp 18

## 2016-03-31 DIAGNOSIS — D638 Anemia in other chronic diseases classified elsewhere: Secondary | ICD-10-CM

## 2016-03-31 DIAGNOSIS — N189 Chronic kidney disease, unspecified: Secondary | ICD-10-CM

## 2016-03-31 DIAGNOSIS — D631 Anemia in chronic kidney disease: Secondary | ICD-10-CM

## 2016-03-31 LAB — CBC WITH DIFFERENTIAL/PLATELET
BASOS PCT: 0 %
Basophils Absolute: 0 10*3/uL (ref 0.0–0.1)
EOS ABS: 0 10*3/uL (ref 0.0–0.7)
Eosinophils Relative: 1 %
HEMATOCRIT: 32 % — AB (ref 36.0–46.0)
Hemoglobin: 10.6 g/dL — ABNORMAL LOW (ref 12.0–15.0)
Lymphocytes Relative: 22 %
Lymphs Abs: 1.5 10*3/uL (ref 0.7–4.0)
MCH: 31.7 pg (ref 26.0–34.0)
MCHC: 33.1 g/dL (ref 30.0–36.0)
MCV: 95.8 fL (ref 78.0–100.0)
MONO ABS: 0.4 10*3/uL (ref 0.1–1.0)
MONOS PCT: 5 %
Neutro Abs: 4.9 10*3/uL (ref 1.7–7.7)
Neutrophils Relative %: 72 %
Platelets: 199 10*3/uL (ref 150–400)
RBC: 3.34 MIL/uL — ABNORMAL LOW (ref 3.87–5.11)
RDW: 12.5 % (ref 11.5–15.5)
WBC: 6.7 10*3/uL (ref 4.0–10.5)

## 2016-03-31 MED ORDER — DARBEPOETIN ALFA 60 MCG/0.3ML IJ SOSY
60.0000 ug | PREFILLED_SYRINGE | Freq: Once | INTRAMUSCULAR | Status: AC
  Administered 2016-03-31: 60 ug via SUBCUTANEOUS

## 2016-03-31 MED ORDER — DARBEPOETIN ALFA 60 MCG/0.3ML IJ SOSY
PREFILLED_SYRINGE | INTRAMUSCULAR | Status: AC
  Filled 2016-03-31: qty 0.3

## 2016-03-31 MED ORDER — INFLUENZA VAC SPLIT QUAD 0.5 ML IM SUSY
PREFILLED_SYRINGE | INTRAMUSCULAR | Status: AC
  Filled 2016-03-31: qty 0.5

## 2016-03-31 NOTE — Patient Instructions (Signed)
[place] at [facility] Discharge Instructions  RECOMMENDATIONS MADE BY THE CONSULTANT AND ANY TEST RESULTS WILL BE SENT TO YOUR REFERRING PHYSICIAN.  Hemoglobin 10.6 today. Aranesp 60 mcg injection given as ordered. Return as scheduled.  Thank you for choosing [place] at [facility] to provide your oncology and hematology care.  To afford each patient quality time with our provider, please arrive at least 15 minutes before your scheduled appointment time.   Beginning January 23rd 2017 lab work for the [facility] will be done in the  Main lab at [facility] on 1st floor. If you have a lab appointment with the [facility] please come in thru the  Main Entrance and check in at the main information desk  You need to re-schedule your appointment should you arrive 10 or more minutes late.  We strive to give you quality time with our providers, and arriving late affects you and other patients whose appointments are after yours.  Also, if you no show three or more times for appointments you may be dismissed from the clinic at the providers discretion.     Again, thank you for choosing [facility].  Our hope is that these requests will decrease the amount of time that you wait before being seen by our physicians.       _____________________________________________________________  Should you have questions after your visit to [facility], please contact our office at [phone] between the hours of 8:30 a.m. and 4:30 p.m.  Voicemails left after 4:30 p.m. will not be returned until the following business day.  For prescription refill requests, have your pharmacy contact our office.         Resources For Cancer Patients and their Caregivers ? American Cancer Society: Can assist with transportation, wigs, general needs, runs Look Good Feel Better.        906-770-6759 ? Cancer Care: Provides financial  assistance, online support groups, medication/co-pay assistance.  1-800-813-HOPE 858-017-9139) ? [place] [place] Assists [place] cancer patients and their families through emotional , educational and financial support.  513-057-2861 ? [place] DSS Where to apply for food stamps, Medicaid and utility assistance. (334) 483-7822 ? [facility]: Transportation to medical appointments. 509 273 9343 ? Social Security Administration: May apply for disability if have a Stage IV cancer. 8050216721 951-885-9926 ? [employer], Disability and Transit Services: Assists with nutrition, care and transit needs. [place] Support Programs: @10RELATIVEDAYS @ > Cancer Support Group  2nd Tuesday of the month 1pm-2pm, Journey Room  > Creative Journey  3rd Tuesday of the month 1130am-1pm, Journey Room  > Look Good Feel Better  1st Wednesday of the month 10am-12 noon, Journey Room (Call [place] to register [phone])

## 2016-03-31 NOTE — Progress Notes (Signed)
[name] presents today for injection per MD orders. Aranesp 60mcg administered SQ in right Abdomen. Administration without incident. Patient tolerated well.  

## 2016-04-04 ENCOUNTER — Telehealth: Payer: Self-pay | Admitting: *Deleted

## 2016-04-04 NOTE — Telephone Encounter (Signed)
Prior Authorization started for sleep study  Pending clinical review with [facility]   REF [id number]

## 2016-04-21 ENCOUNTER — Ambulatory Visit (HOSPITAL_COMMUNITY)
Admission: RE | Admit: 2016-04-21 | Discharge: 2016-04-21 | Disposition: A | Discharge status: Home / Self Care | Payer: Medicare HMO | Source: Ambulatory Visit | Attending: Cardiology | Admitting: Cardiology

## 2016-04-21 VITALS — BP 114/66 | HR 67 | Wt 167.8 lb

## 2016-04-21 DIAGNOSIS — I428 Other cardiomyopathies: Secondary | ICD-10-CM | POA: Diagnosis not present

## 2016-04-21 DIAGNOSIS — Z79899 Other long term (current) drug therapy: Secondary | ICD-10-CM | POA: Diagnosis not present

## 2016-04-21 DIAGNOSIS — Z87891 Personal history of nicotine dependence: Secondary | ICD-10-CM | POA: Insufficient documentation

## 2016-04-21 DIAGNOSIS — I480 Paroxysmal atrial fibrillation: Secondary | ICD-10-CM | POA: Diagnosis not present

## 2016-04-21 DIAGNOSIS — G4733 Obstructive sleep apnea (adult) (pediatric): Secondary | ICD-10-CM | POA: Diagnosis not present

## 2016-04-21 DIAGNOSIS — D649 Anemia, unspecified: Secondary | ICD-10-CM | POA: Diagnosis not present

## 2016-04-21 DIAGNOSIS — Z7901 Long term (current) use of anticoagulants: Secondary | ICD-10-CM | POA: Diagnosis not present

## 2016-04-21 DIAGNOSIS — I5022 Chronic systolic (congestive) heart failure: Secondary | ICD-10-CM | POA: Diagnosis not present

## 2016-04-21 DIAGNOSIS — N184 Chronic kidney disease, stage 4 (severe): Secondary | ICD-10-CM | POA: Insufficient documentation

## 2016-04-21 DIAGNOSIS — I5043 Acute on chronic combined systolic (congestive) and diastolic (congestive) heart failure: Secondary | ICD-10-CM

## 2016-04-21 DIAGNOSIS — I5042 Chronic combined systolic (congestive) and diastolic (congestive) heart failure: Secondary | ICD-10-CM

## 2016-04-21 LAB — COMPREHENSIVE METABOLIC PANEL
ALT: 12 U/L — AB (ref 14–54)
AST: 14 U/L — ABNORMAL LOW (ref 15–41)
Albumin: 4 g/dL (ref 3.5–5.0)
Alkaline Phosphatase: 68 U/L (ref 38–126)
Anion gap: 8 (ref 5–15)
BUN: 53 mg/dL — ABNORMAL HIGH (ref 6–20)
CHLORIDE: 105 mmol/L (ref 101–111)
CO2: 25 mmol/L (ref 22–32)
Calcium: 9.9 mg/dL (ref 8.9–10.3)
Creatinine, Ser: 2.75 mg/dL — ABNORMAL HIGH (ref 0.44–1.00)
GFR, EST AFRICAN AMERICAN: 19 mL/min — AB (ref >60)
GFR, EST NON AFRICAN AMERICAN: 16 mL/min — AB (ref >60)
Glucose, Bld: 195 mg/dL — ABNORMAL HIGH (ref 65–99)
POTASSIUM: 4.4 mmol/L (ref 3.5–5.1)
SODIUM: 138 mmol/L (ref 135–145)
Total Bilirubin: 1 mg/dL (ref 0.3–1.2)
Total Protein: 7.7 g/dL (ref 6.5–8.1)

## 2016-04-21 LAB — BRAIN NATRIURETIC PEPTIDE: B NATRIURETIC PEPTIDE 5: 445.8 pg/mL — AB (ref 0.0–100.0)

## 2016-04-21 NOTE — Progress Notes (Signed)
Patient ID: [name], female   DOB: 09/28/1945, 70 y.o.   MRN: [id number] PCP: Dr. Karie Kirks HF Cardiology: Dr. Aundra Dubin  70 yo with history of chronic systolic CHF from nonischemic cardiomyopathy, prior PE, CKD, and paroxysmal atrial fibrillation presents for CHF clinic followup.  She has a long history of cardiomyopathy, dating back to 1999.  At that time, she had coronary angiography showing no significant disease. She was initially followed by Dr Lattie Haw, later by Dr. Lovena Le. She had her initial CRT-D device in 2005.  This was removed in 2007 due to enterococcal bacteremia with vegetation.  She had Medtronic CRT- D device placed in 8/14.  She has had trouble with paroxysmal atrial fibrillation, and she was put on amiodarone and cardioverted in 12/14.  TEE in 12/14 showed EF 15% but RV appeared normal.   Had RHC on 04/18/14 for possible low output. This showed volume depletion with normal cardiac output. Lasix held for a few days then cut back from 80 bid to 40 bid. Lisinopril also held but has been restarted.   Admitted with anemia 6/9 through 12/02/2014. Received 1UPRBCs. Diuresed with IV lasix. She had follow up with hematology for Epogen injections. Discharge weight was 152 pounds.   She was admitted later in 6/16, this time with enterococcal bacteremia and pacemaker lead vegetation by TEE.  Her CRT-D device was again extracted.  Her device has remained out since that time.  She has not noted a significant change in symptoms without BiV pacing.    She returns for followup.  She is stable generally.  Walks with a walker => able to go in stores like [employer] without much problem.  No dyspnea if she takes her time walking on flat ground. She is short of breath with fast walking. Weight is stable.  Digoxin was stopped in 8/17 with elevated creatinine.  She is seeing Dr Joelyn Oms at [facility].    Labs (7/15): K 3.5, creatinine 1.78, hemoglobin 9.4,m BNP 24607 Labs  (10/15): K 4.7, creatinine 2.3, digoxin 0.7, BNP 8376, HCT 30.9 Labs (04/18/14): K 4.3 creatinine 2.8  Labs (11/15): K 5.3, creatinine 1.9 Labs (10/26/2014) K 3.7 Creatinine 1.94 Hgb 9.3, LFTs normal Labs (11/2014): K 3.6 Creatinine 1.80=>1.65, digoxin 0.5 Labs (7/16): hgb 9 Labs (01/22/15): K 4.0 creatinine 1.67 Labs (8/17): K 4.6, creatinine 2.52, hgb 10.1 Labs (9/17): K 4.8, creatinine 2.78 Labs (10/17): hgb 10.7  ECG (8/17): NSR, profound LBBB   PMH:  1. Chronic systolic CHF: Nonischemic cardiomyopathy.  1999 had LHC with normal coronaries.  12/05 had Guidant BiV ICD placed. 5/07 had enterococcal bacteremia with ICD lead vegetation, device extracted.  8/14 had placement of Medtronic BiV ICD device.  Cardiolite (12/09) with multiple wall segments showing scarring, some inferior ischemia.  Echo (6/14) with EF 15-20%, diffuse hypokinesis with some regionality, normal RV size and systolic function.  TEE (12/14) with EF 15%, normal RV size and systolic function. RHC (10/15) with mean RA 1, PA 27/8, mean PCWP 5, CI 2.71. Enterococcal bacteremia 6/16 with CRT-D extraction.  Echo (4/16) with EF 15-20%, moderate LVH, diffuse hypokinesis, mild-moderate AI, mild MR, PASP 33 mmHg.  - Echo (9/17) with EF 20%, moderate LV dilation, moderate MR, normal RV size/systolic function.  2. PE: 2/07 after right THR.  3. Right THR 4. LBBB 5. GERD 6. Hyperlipidemia 7. HTN 8. Anemia: 12/14 had EGD that was normal, colonoscopy with 3 polyps removed.  9. CKD 10. OSA: Sleep study 9/16 with moderate  to severe OSA.  11. Atrial fibrillation: Paroxysmal.  TEE-guided DCCV in 2/14, TEE-guided DCCV in 12/14 on amiodarone.  12. Enterococcal bacteremia with CRT-D device removal in 5/07 then again in 6/16.    SH: Married, lives in [place], previous smoker.   FH: Mother with MI.   ROS: All systems reviewed and negative except as per HPI.   Current Outpatient Prescriptions  Medication Sig Dispense Refill  . ACCU-CHEK  AVIVA PLUS test strip     . amiodarone (PACERONE) 200 MG tablet Take 0.5 tablets (100 mg total) by mouth daily. 90 tablet 3  . carvedilol (COREG) 6.25 MG tablet Take 1 tablet (6.25 mg total) by mouth 2 (two) times daily with a meal. 60 tablet 3  . colchicine 0.6 MG tablet Take 0.6 mg by mouth 2 (two) times daily as needed (gout). Reported on 11/06/2015    . feeding supplement, ENSURE ENLIVE, (ENSURE ENLIVE) LIQD Take 237 mLs by mouth 2 (two) times daily between meals. 237 mL 12  . ferrous sulfate 324 (65 FE) MG TBEC Take 1 tablet (325 mg total) by mouth 2 (two) times daily. 60 tablet   . furosemide (LASIX) 40 MG tablet TAKE 1 TABLET BY MOUTH TWICE DAILY. 60 tablet 3  . glimepiride (AMARYL) 2 MG tablet Take 2 mg by mouth daily.    . isosorbide mononitrate (IMDUR) 30 MG 24 hr tablet Take 1 tablet (30 mg total) by mouth daily. 30 tablet 0  . levothyroxine (SYNTHROID, LEVOTHROID) 25 MCG tablet Take 1 tablet (25 mcg total) by mouth daily before breakfast. 30 tablet 0  . lisinopril (PRINIVIL,ZESTRIL) 10 MG tablet TAKE ONE TABLET BY MOUTH DAILY. 30 tablet 3  . metoCLOPramide (REGLAN) 5 MG tablet Take 5 mg by mouth 2 (two) times daily.     . ondansetron (ZOFRAN) 4 MG tablet Take 4 mg by mouth every 4 (four) hours as needed for nausea or vomiting. Reported on 11/06/2015    . oxyCODONE-acetaminophen (PERCOCET) 10-325 MG per tablet Take 1 tablet by mouth every 6 (six) hours as needed for pain.     . polyethylene glycol (MIRALAX / GLYCOLAX) packet Take 17 g by mouth daily as needed for mild constipation.    . pravastatin (PRAVACHOL) 40 MG tablet Take 80 mg by mouth at bedtime.     Marland Kitchen spironolactone (ALDACTONE) 25 MG tablet TAKE 1/2 TABLET BY MOUTH DAILY. 45 tablet 2  . warfarin (COUMADIN) 2.5 MG tablet Take 0.5-1 tablets (1.25-2.5 mg total) by mouth daily at 6 PM. Takes 1 tablet on Mon, Wed, Fri, take 0.5 tablet on all other days     No current facility-administered medications for this encounter.     BP  114/66 (BP Location: Left Arm, Patient Position: Sitting, Cuff Size: Normal)   Pulse 67   Wt 167 lb 12.8 oz (76.1 kg)   SpO2 98%   BMI 27.92 kg/m  General: NAD. Husband present.  Neck: JVP 6 cm, no thyromegaly or thyroid nodule.  Lungs: Clear to auscultation bilaterally with normal respiratory effort. CV: Nondisplaced PMI.  Heart regular S1/S2 with paradoxical S2 split, no S3/S4, 1/6 early SEM RUSB.  No ankle edema.  No carotid bruit.  Normal pedal pulses.  Abdomen: Soft, nontender, no hepatosplenomegaly, no distention.  Skin: Intact without lesions or rashes.  Neurologic: Alert and oriented x 3.  Psych: Normal affect. Extremities: No clubbing or cyanosis.   HEENT: Normal.   Assessment/Plan: 1. Chronic systolic CHF: Patient has a nonischemic cardiomyopathy. Echo (9/17) with EF  20%, moderate LV dilation. RHC (10/15) showed low filling pressures and normal cardiac output.She is unable to do CPX testing and probably would not be a good LVAD candidate.  NYHA II-III symptoms but not very active. Volume status stable. She had removal of CRT-D system for the 2nd time due to endocarditis in 6/16, symptoms seem about the same without BiV pacing as with.  - Continue current dose of lasix.  - Continue current Coreg dose, will not increase with episodes of bradycardia.   - Off digoxin with creatinine rise.   - Continue spironolactone and lisinopril at current doses but check BMET today.  Need to be very careful of hyperkalemia.  - Would not replace CRT-D system at this point given 2 episodes of endocarditis now with device explantation.  She also is not interested in replacing the device.  2. Atrial fibrillation: Paroxysmal, in NSR.   - Continue amiodarone 100 mg daily, check LFTs today.  PCP following thyroid (on levothyroxine).  Needs regular eye exam.  - She has been on coumadin with history of anemia. No definite GI bleeding source noted.Goal 2-2.5. Followed by PCP.  3. CKD: Stage IV.  Sees  nephrology.  Need to be very careful with lisinopril and spironolactone.  BMET today.  4. Anemia: She had EGD and c-scope in 12/14. In 12/15, she had transfusion but apparently no-showed for her GI workup. Seen by hematology earlier this year, suspect normocytic anemia is most likely due to combination of CKD and hypothyroidism, less likely MDS. Erythropoeitin started. She has had 5 units PRBCs in 5/16 and 1 unit in 6/16. Per GI: Iron deficiency anemia with blood in stool, few scattered lymphangiectasias noted on the small bowel capsule study but no source of blood loss identified. Small lesions could be missed as she had some debris in the small bowel. Continue aranesp injections every 3 weeks.  If she has recurrent bleed can consider stopping coumadin +/- [place].  5. OSA: Moderate to severe OSA on sleep study.  Has CPAP titration scheduled.   Followup in 3 months.    Loralie Champagne MD 04/21/2016

## 2016-04-21 NOTE — Patient Instructions (Signed)
Labs today We will only contact you if something comes back abnormal or we need to make some changes. Otherwise no news is good news!    Your physician recommends that you schedule a follow-up appointment in: 3 months with Dr McLean   Do the following things EVERYDAY: 1) Weigh yourself in the morning before breakfast. Write it down and keep it in a log. 2) Take your medicines as prescribed 3) Eat low salt foods-Limit salt (sodium) to 2000 mg per day.  4) Stay as active as you can everyday 5) Limit all fluids for the day to less than 2 liters   

## 2016-04-22 ENCOUNTER — Ambulatory Visit (HOSPITAL_COMMUNITY): Payer: Medicare HMO

## 2016-04-22 ENCOUNTER — Other Ambulatory Visit (HOSPITAL_COMMUNITY): Payer: Medicare HMO

## 2016-04-23 ENCOUNTER — Ambulatory Visit: Payer: Medicare HMO | Attending: Cardiology | Admitting: Cardiology

## 2016-04-23 DIAGNOSIS — I493 Ventricular premature depolarization: Secondary | ICD-10-CM | POA: Insufficient documentation

## 2016-04-23 DIAGNOSIS — R0683 Snoring: Secondary | ICD-10-CM | POA: Diagnosis not present

## 2016-04-23 DIAGNOSIS — G4733 Obstructive sleep apnea (adult) (pediatric): Secondary | ICD-10-CM | POA: Insufficient documentation

## 2016-04-28 ENCOUNTER — Encounter (HOSPITAL_COMMUNITY): Payer: Medicare HMO | Attending: Hematology & Oncology

## 2016-04-28 ENCOUNTER — Encounter (HOSPITAL_COMMUNITY): Payer: Medicare HMO

## 2016-04-28 DIAGNOSIS — D500 Iron deficiency anemia secondary to blood loss (chronic): Secondary | ICD-10-CM | POA: Insufficient documentation

## 2016-04-28 DIAGNOSIS — D638 Anemia in other chronic diseases classified elsewhere: Secondary | ICD-10-CM

## 2016-04-28 LAB — CBC WITH DIFFERENTIAL/PLATELET
BASOS ABS: 0 10*3/uL (ref 0.0–0.1)
Basophils Relative: 0 %
Eosinophils Absolute: 0 10*3/uL (ref 0.0–0.7)
Eosinophils Relative: 1 %
HCT: 33.8 % — ABNORMAL LOW (ref 36.0–46.0)
Hemoglobin: 11.1 g/dL — ABNORMAL LOW (ref 12.0–15.0)
LYMPHS ABS: 1.3 10*3/uL (ref 0.7–4.0)
LYMPHS PCT: 25 %
MCH: 32 pg (ref 26.0–34.0)
MCHC: 32.8 g/dL (ref 30.0–36.0)
MCV: 97.4 fL (ref 78.0–100.0)
Monocytes Absolute: 0.3 10*3/uL (ref 0.1–1.0)
Monocytes Relative: 6 %
NEUTROS ABS: 3.7 10*3/uL (ref 1.7–7.7)
Neutrophils Relative %: 68 %
Platelets: 192 10*3/uL (ref 150–400)
RBC: 3.47 MIL/uL — AB (ref 3.87–5.11)
RDW: 12.7 % (ref 11.5–15.5)
WBC: 5.4 10*3/uL (ref 4.0–10.5)

## 2016-04-28 LAB — COMPREHENSIVE METABOLIC PANEL
ALK PHOS: 66 U/L (ref 38–126)
ALT: 10 U/L — AB (ref 14–54)
ANION GAP: 7 (ref 5–15)
AST: 14 U/L — AB (ref 15–41)
Albumin: 4 g/dL (ref 3.5–5.0)
BILIRUBIN TOTAL: 0.9 mg/dL (ref 0.3–1.2)
BUN: 48 mg/dL — ABNORMAL HIGH (ref 6–20)
CHLORIDE: 103 mmol/L (ref 101–111)
CO2: 25 mmol/L (ref 22–32)
Calcium: 9.6 mg/dL (ref 8.9–10.3)
Creatinine, Ser: 2.82 mg/dL — ABNORMAL HIGH (ref 0.44–1.00)
GFR calc non Af Amer: 16 mL/min — ABNORMAL LOW (ref >60)
GFR, EST AFRICAN AMERICAN: 18 mL/min — AB (ref >60)
GLUCOSE: 262 mg/dL — AB (ref 65–99)
POTASSIUM: 4.6 mmol/L (ref 3.5–5.1)
Sodium: 135 mmol/L (ref 135–145)
Total Protein: 7.6 g/dL (ref 6.5–8.1)

## 2016-04-28 NOTE — Patient Instructions (Signed)
[place] at [facility] Discharge Instructions  RECOMMENDATIONS MADE BY THE CONSULTANT AND ANY TEST RESULTS WILL BE SENT TO YOUR REFERRING PHYSICIAN.  Hemoglobin 11.1 today. Aranesp not needed. Return as scheduled.  Thank you for choosing [place] at [facility] to provide your oncology and hematology care.  To afford each patient quality time with our provider, please arrive at least 15 minutes before your scheduled appointment time.   Beginning January 23rd 2017 lab work for the [facility] will be done in the  Main lab at [facility] on 1st floor. If you have a lab appointment with the [facility] please come in thru the  Main Entrance and check in at the main information desk  You need to re-schedule your appointment should you arrive 10 or more minutes late.  We strive to give you quality time with our providers, and arriving late affects you and other patients whose appointments are after yours.  Also, if you no show three or more times for appointments you may be dismissed from the clinic at the providers discretion.     Again, thank you for choosing [facility].  Our hope is that these requests will decrease the amount of time that you wait before being seen by our physicians.       _____________________________________________________________  Should you have questions after your visit to [facility], please contact our office at [phone] between the hours of 8:30 a.m. and 4:30 p.m.  Voicemails left after 4:30 p.m. will not be returned until the following business day.  For prescription refill requests, have your pharmacy contact our office.         Resources For Cancer Patients and their Caregivers ? American Cancer Society: Can assist with transportation, wigs, general needs, runs Look Good Feel Better.        615-455-3965 ? Cancer Care: Provides financial assistance, online support  groups, medication/co-pay assistance.  1-800-813-HOPE 416-531-8277) ? [place] Assists [place] cancer patients and their families through emotional , educational and financial support.  (813)370-2692 ? [place] DSS Where to apply for food stamps, Medicaid and utility assistance. 404-339-5131 ? RCATS: Transportation to medical appointments. 365-763-4151 ? Social Security Administration: May apply for disability if have a Stage IV cancer. 5712855087 769-515-4850 ? [employer], Disability and Transit Services: Assists with nutrition, care and transit needs. [place] Support Programs: @10RELATIVEDAYS @ > Cancer Support Group  2nd Tuesday of the month 1pm-2pm, Journey Room  > Creative Journey  3rd Tuesday of the month 1130am-1pm, Journey Room  > Look Good Feel Better  1st Wednesday of the month 10am-12 noon, Journey Room (Call [place] to register [phone])

## 2016-04-28 NOTE — Procedures (Signed)
   Patient Name: [name], [name] Date: 04/23/2016 Gender: Not Specified D.O.B: Dec 06, 1945 Age (years): 70 Referring Provider: Loralie Champagne Height (inches): 10 Interpreting Physician: Fransico Him MD, ABSM Weight (lbs): 150 RPSGT: Peak, Robert BMI: 25 MRN: [id number] Neck Size: 15.00  CLINICAL INFORMATION Sleep Study Type: NPSG Indication for sleep study: OSA Epworth Sleepiness Score: 13 Most recent polysomnogram dated 03/04/2015 revealed an AHI of 32.8/h and RDI of 32.8/h. Most recent titration study dated 03/04/2015 revealed an AHI of 6.1/h.  SLEEP STUDY TECHNIQUE As per the AASM Manual for the Scoring of Sleep and Associated Events v2.3 (April 2016) with a hypopnea requiring 4% desaturations. The channels recorded and monitored were frontal, central and occipital EEG, electrooculogram (EOG), submentalis EMG (chin), nasal and oral airflow, thoracic and abdominal wall motion, anterior tibialis EMG, snore microphone, electrocardiogram, and pulse oximetry.  MEDICATIONS Medications self-administered by patient taken the night of the study : N/A  SLEEP ARCHITECTURE The study was initiated at 10:42:14 PM and ended at 6:17:32 AM. Sleep onset time was 0.7 minutes and the sleep efficiency was 87.8%. The total sleep time was 399.6 minutes. Stage REM latency was 46.0 minutes. The patient spent 8.63% of the night in stage N1 sleep, 66.22% in stage N2 sleep, 13.64% in stage N3 and 11.51% in REM. Alpha intrusion was absent. Supine sleep was 54.30%.  RESPIRATORY PARAMETERS The overall apnea/hypopnea index (AHI) was 19.2 per hour. There were 36 total apneas, including 8 obstructive, 28 central and 0 mixed apneas. There were 92 hypopneas and 83 RERAs. The AHI during Stage REM sleep was 10.4 per hour. AHI while supine was 17.1 per hour. The mean oxygen saturation was 95.52%. The minimum SpO2 during sleep was 88.00%. Soft snoring was noted during this study.  CARDIAC DATA The 2 lead  EKG demonstrated sinus rhythm. The mean heart rate was 55.93 beats per minute. Other EKG findings include: PVCs.  LEG MOVEMENT DATA The total PLMS were 0 with a resulting PLMS index of 0.00. Associated arousal with leg movement index was 0.0 .  IMPRESSIONS - Moderate obstructive sleep apnea occurred during this study (AHI = 19.2/h). - No significant central sleep apnea occurred during this study (CAI = 4.2/h). - The patient had minimal or no oxygen desaturation during the study (Min O2 = 88.00%) - The patient snored with Soft snoring volume. - EKG findings include PVCs. - Clinically significant periodic limb movements did not occur during sleep. No significant associated arousals.  DIAGNOSIS - Obstructive Sleep Apnea (327.23 [G47.33 ICD-10])  RECOMMENDATIONS - Therapeutic CPAP titration to determine optimal pressure required to alleviate sleep disordered breathing. - Avoid alcohol, sedatives and other CNS depressants that may worsen sleep apnea and disrupt normal sleep architecture. - Sleep hygiene should be reviewed to assess factors that may improve sleep quality. - Weight management and regular exercise should be initiated or continued if appropriate.  [place], American Board of Sleep Medicine  ELECTRONICALLY SIGNED ON:  04/28/2016, 12:25 PM [place] PH: [phone]   FX: [phone] [place]

## 2016-04-28 NOTE — Progress Notes (Signed)
Hemoglobin 11.1 today. Aranesp not given, treatment parameters not met.

## 2016-04-29 LAB — FERRITIN: FERRITIN: 189 ng/mL (ref 11–307)

## 2016-05-01 NOTE — Progress Notes (Signed)
This was patient second sleep study. She received the machine but was non compliant and turned it in. She decided she did want the machine and had to undergo another sleep study in order to get another machine.

## 2016-05-01 NOTE — Progress Notes (Signed)
Patient has been to the sleep 3 times this year. [facility] states they can do an auto titration if we order it. Routed back to DRTurner to advise

## 2016-05-02 ENCOUNTER — Encounter: Payer: Self-pay | Admitting: *Deleted

## 2016-05-02 ENCOUNTER — Other Ambulatory Visit: Payer: Self-pay | Admitting: *Deleted

## 2016-05-02 DIAGNOSIS — G4733 Obstructive sleep apnea (adult) (pediatric): Secondary | ICD-10-CM

## 2016-05-02 NOTE — Progress Notes (Signed)
Spoke to the patient and her husband and they both verbalized understanding.

## 2016-05-20 ENCOUNTER — Other Ambulatory Visit (HOSPITAL_COMMUNITY): Payer: Medicare HMO

## 2016-05-20 ENCOUNTER — Ambulatory Visit (HOSPITAL_COMMUNITY): Payer: Medicare HMO

## 2016-05-23 ENCOUNTER — Other Ambulatory Visit (HOSPITAL_COMMUNITY): Payer: Self-pay

## 2016-05-23 DIAGNOSIS — D649 Anemia, unspecified: Secondary | ICD-10-CM

## 2016-05-26 ENCOUNTER — Encounter (HOSPITAL_COMMUNITY): Payer: Medicare HMO | Attending: Hematology & Oncology

## 2016-05-26 ENCOUNTER — Encounter (HOSPITAL_COMMUNITY): Payer: Medicare HMO

## 2016-05-26 ENCOUNTER — Other Ambulatory Visit: Payer: Self-pay | Admitting: Internal Medicine

## 2016-05-26 VITALS — BP 90/53 | HR 72 | Temp 98.2°F | Resp 18

## 2016-05-26 DIAGNOSIS — D649 Anemia, unspecified: Secondary | ICD-10-CM | POA: Insufficient documentation

## 2016-05-26 DIAGNOSIS — D638 Anemia in other chronic diseases classified elsewhere: Secondary | ICD-10-CM

## 2016-05-26 DIAGNOSIS — D631 Anemia in chronic kidney disease: Secondary | ICD-10-CM | POA: Diagnosis not present

## 2016-05-26 DIAGNOSIS — N189 Chronic kidney disease, unspecified: Secondary | ICD-10-CM

## 2016-05-26 LAB — CBC WITH DIFFERENTIAL/PLATELET
BASOS ABS: 0 10*3/uL (ref 0.0–0.1)
BASOS PCT: 0 %
Eosinophils Absolute: 0.1 10*3/uL (ref 0.0–0.7)
Eosinophils Relative: 1 %
HEMATOCRIT: 31.5 % — AB (ref 36.0–46.0)
HEMOGLOBIN: 10.3 g/dL — AB (ref 12.0–15.0)
LYMPHS PCT: 21 %
Lymphs Abs: 1.3 10*3/uL (ref 0.7–4.0)
MCH: 31.8 pg (ref 26.0–34.0)
MCHC: 32.7 g/dL (ref 30.0–36.0)
MCV: 97.2 fL (ref 78.0–100.0)
MONO ABS: 0.4 10*3/uL (ref 0.1–1.0)
Monocytes Relative: 6 %
NEUTROS ABS: 4.4 10*3/uL (ref 1.7–7.7)
NEUTROS PCT: 72 %
Platelets: 202 10*3/uL (ref 150–400)
RBC: 3.24 MIL/uL — ABNORMAL LOW (ref 3.87–5.11)
RDW: 12.6 % (ref 11.5–15.5)
WBC: 6.2 10*3/uL (ref 4.0–10.5)

## 2016-05-26 MED ORDER — DARBEPOETIN ALFA 60 MCG/0.3ML IJ SOSY
PREFILLED_SYRINGE | INTRAMUSCULAR | Status: AC
  Filled 2016-05-26: qty 0.3

## 2016-05-26 MED ORDER — DARBEPOETIN ALFA 60 MCG/0.3ML IJ SOSY
60.0000 ug | PREFILLED_SYRINGE | Freq: Once | INTRAMUSCULAR | Status: AC
  Administered 2016-05-26: 60 ug via SUBCUTANEOUS

## 2016-05-26 NOTE — Progress Notes (Signed)
[name] presents today for injection per MD orders. Aranesp 60 mcg administered SQ in left Abdomen. Administration without incident. Patient tolerated well.  

## 2016-05-26 NOTE — Patient Instructions (Signed)
[place] at [facility] Discharge Instructions  RECOMMENDATIONS MADE BY THE CONSULTANT AND ANY TEST RESULTS WILL BE SENT TO YOUR REFERRING PHYSICIAN.  Hemoglobin 10.3. Aranesp 60 mcg injection given as ordered. Return as scheduled.  Thank you for choosing [place] at [facility] to provide your oncology and hematology care.  To afford each patient quality time with our provider, please arrive at least 15 minutes before your scheduled appointment time.   Beginning January 23rd 2017 lab work for the [facility] will be done in the  Main lab at [facility] on 1st floor. If you have a lab appointment with the [facility] please come in thru the  Main Entrance and check in at the main information desk  You need to re-schedule your appointment should you arrive 10 or more minutes late.  We strive to give you quality time with our providers, and arriving late affects you and other patients whose appointments are after yours.  Also, if you no show three or more times for appointments you may be dismissed from the clinic at the providers discretion.     Again, thank you for choosing [facility].  Our hope is that these requests will decrease the amount of time that you wait before being seen by our physicians.       _____________________________________________________________  Should you have questions after your visit to [facility], please contact our office at [phone] between the hours of 8:30 a.m. and 4:30 p.m.  Voicemails left after 4:30 p.m. will not be returned until the following business day.  For prescription refill requests, have your pharmacy contact our office.         Resources For Cancer Patients and their Caregivers ? American Cancer Society: Can assist with transportation, wigs, general needs, runs Look Good Feel Better.        754-475-3079 ? Cancer Care: Provides financial assistance,  online support groups, medication/co-pay assistance.  1-800-813-HOPE (541)341-2939) ? [place] Assists [place] cancer patients and their families through emotional , educational and financial support.  626-134-1323 ? [place] DSS Where to apply for food stamps, Medicaid and utility assistance. (680) 557-2738 ? RCATS: Transportation to medical appointments. 281-698-3459 ? Social Security Administration: May apply for disability if have a Stage IV cancer. (801) 439-4326 530 120 3098 ? [employer], Disability and Transit Services: Assists with nutrition, care and transit needs. [place] Support Programs: @10RELATIVEDAYS @ > Cancer Support Group  2nd Tuesday of the month 1pm-2pm, Journey Room  > Creative Journey  3rd Tuesday of the month 1130am-1pm, Journey Room  > Look Good Feel Better  1st Wednesday of the month 10am-12 noon, Journey Room (Call [place] to register [phone])

## 2016-05-27 ENCOUNTER — Encounter (HOSPITAL_BASED_OUTPATIENT_CLINIC_OR_DEPARTMENT_OTHER): Payer: Medicare HMO

## 2016-06-13 ENCOUNTER — Other Ambulatory Visit (HOSPITAL_COMMUNITY): Payer: Self-pay | Admitting: Cardiology

## 2016-06-19 ENCOUNTER — Ambulatory Visit (HOSPITAL_COMMUNITY): Payer: Medicare HMO

## 2016-06-19 ENCOUNTER — Other Ambulatory Visit (HOSPITAL_COMMUNITY): Payer: Medicare HMO

## 2016-06-19 ENCOUNTER — Ambulatory Visit (HOSPITAL_COMMUNITY): Payer: Medicare HMO | Admitting: Oncology

## 2016-06-24 ENCOUNTER — Ambulatory Visit (HOSPITAL_BASED_OUTPATIENT_CLINIC_OR_DEPARTMENT_OTHER): Payer: Medicare HMO

## 2016-06-25 ENCOUNTER — Encounter (HOSPITAL_BASED_OUTPATIENT_CLINIC_OR_DEPARTMENT_OTHER): Payer: Medicare HMO

## 2016-06-25 ENCOUNTER — Encounter (HOSPITAL_COMMUNITY): Payer: Self-pay | Admitting: Oncology

## 2016-06-25 ENCOUNTER — Ambulatory Visit (HOSPITAL_BASED_OUTPATIENT_CLINIC_OR_DEPARTMENT_OTHER): Payer: Medicare HMO | Attending: Cardiology | Admitting: Cardiology

## 2016-06-25 ENCOUNTER — Encounter (HOSPITAL_COMMUNITY): Payer: Medicare HMO

## 2016-06-25 ENCOUNTER — Encounter (HOSPITAL_COMMUNITY): Payer: Medicare HMO | Attending: Oncology | Admitting: Oncology

## 2016-06-25 VITALS — BP 110/53 | HR 65 | Temp 97.7°F | Resp 18 | Wt 173.0 lb

## 2016-06-25 DIAGNOSIS — N184 Chronic kidney disease, stage 4 (severe): Secondary | ICD-10-CM | POA: Insufficient documentation

## 2016-06-25 DIAGNOSIS — N189 Chronic kidney disease, unspecified: Principal | ICD-10-CM

## 2016-06-25 DIAGNOSIS — Z7901 Long term (current) use of anticoagulants: Secondary | ICD-10-CM | POA: Diagnosis not present

## 2016-06-25 DIAGNOSIS — Z79899 Other long term (current) drug therapy: Secondary | ICD-10-CM | POA: Diagnosis not present

## 2016-06-25 DIAGNOSIS — D631 Anemia in chronic kidney disease: Secondary | ICD-10-CM | POA: Insufficient documentation

## 2016-06-25 DIAGNOSIS — D649 Anemia, unspecified: Secondary | ICD-10-CM

## 2016-06-25 DIAGNOSIS — D500 Iron deficiency anemia secondary to blood loss (chronic): Secondary | ICD-10-CM | POA: Diagnosis not present

## 2016-06-25 DIAGNOSIS — I493 Ventricular premature depolarization: Secondary | ICD-10-CM | POA: Insufficient documentation

## 2016-06-25 DIAGNOSIS — G4733 Obstructive sleep apnea (adult) (pediatric): Secondary | ICD-10-CM | POA: Diagnosis not present

## 2016-06-25 LAB — CBC WITH DIFFERENTIAL/PLATELET
BASOS ABS: 0 10*3/uL (ref 0.0–0.1)
Basophils Relative: 0 %
EOS PCT: 1 %
Eosinophils Absolute: 0.1 10*3/uL (ref 0.0–0.7)
HCT: 31.4 % — ABNORMAL LOW (ref 36.0–46.0)
Hemoglobin: 10.5 g/dL — ABNORMAL LOW (ref 12.0–15.0)
LYMPHS ABS: 1.8 10*3/uL (ref 0.7–4.0)
LYMPHS PCT: 28 %
MCH: 32.8 pg (ref 26.0–34.0)
MCHC: 33.4 g/dL (ref 30.0–36.0)
MCV: 98.1 fL (ref 78.0–100.0)
MONO ABS: 0.4 10*3/uL (ref 0.1–1.0)
Monocytes Relative: 6 %
Neutro Abs: 4 10*3/uL (ref 1.7–7.7)
Neutrophils Relative %: 65 %
PLATELETS: 203 10*3/uL (ref 150–400)
RBC: 3.2 MIL/uL — ABNORMAL LOW (ref 3.87–5.11)
RDW: 12.5 % (ref 11.5–15.5)
WBC: 6.3 10*3/uL (ref 4.0–10.5)

## 2016-06-25 LAB — FERRITIN: Ferritin: 223 ng/mL (ref 11–307)

## 2016-06-25 MED ORDER — DARBEPOETIN ALFA 60 MCG/0.3ML IJ SOSY
PREFILLED_SYRINGE | INTRAMUSCULAR | Status: AC
  Filled 2016-06-25: qty 0.3

## 2016-06-25 MED ORDER — DARBEPOETIN ALFA 60 MCG/0.3ML IJ SOSY
60.0000 ug | PREFILLED_SYRINGE | Freq: Once | INTRAMUSCULAR | Status: AC
  Administered 2016-06-25: 60 ug via SUBCUTANEOUS

## 2016-06-25 NOTE — Progress Notes (Signed)
[name] presents today for injection per MD orders. Aranesp 60 mcg administered SQ in left Abdomen. Administration without incident. Patient tolerated well.  

## 2016-06-25 NOTE — Patient Instructions (Addendum)
[place] at [facility] Discharge Instructions  RECOMMENDATIONS MADE BY THE CONSULTANT AND ANY TEST RESULTS WILL BE SENT TO YOUR REFERRING PHYSICIAN.   Labs monthly  Aranesp every 4 weeks  Return in 4 months for follow up    Thank you for choosing [place] at [facility] to provide your oncology and hematology care.  To afford each patient quality time with our provider, please arrive at least 15 minutes before your scheduled appointment time.    If you have a lab appointment with the [facility] please come in thru the  Main Entrance and check in at the main information desk  You need to re-schedule your appointment should you arrive 10 or more minutes late.  We strive to give you quality time with our providers, and arriving late affects you and other patients whose appointments are after yours.  Also, if you no show three or more times for appointments you may be dismissed from the clinic at the providers discretion.     Again, thank you for choosing [facility].  Our hope is that these requests will decrease the amount of time that you wait before being seen by our physicians.       _____________________________________________________________  Should you have questions after your visit to [facility], please contact our office at [phone] between the hours of 8:30 a.m. and 4:30 p.m.  Voicemails left after 4:30 p.m. will not be returned until the following business day.  For prescription refill requests, have your pharmacy contact our office.       Resources For Cancer Patients and their Caregivers ? American Cancer Society: Can assist with transportation, wigs, general needs, runs Look Good Feel Better.        415-767-4999 ? Cancer Care: Provides financial assistance, online support groups, medication/co-pay assistance.  1-800-813-HOPE 854-808-6042) ? [place] Assists  [place] cancer patients and their families through emotional , educational and financial support.  (701) 155-3665 ? [place] DSS Where to apply for food stamps, Medicaid and utility assistance. (775)499-0467 ? RCATS: Transportation to medical appointments. (938)012-4495 ? Social Security Administration: May apply for disability if have a Stage IV cancer. (209) 201-5508 703-730-3034 ? [employer], Disability and Transit Services: Assists with nutrition, care and transit needs. [place] Support Programs: @10RELATIVEDAYS @ > Cancer Support Group  2nd Tuesday of the month 1pm-2pm, Journey Room  > Creative Journey  3rd Tuesday of the month 1130am-1pm, Journey Room  > Look Good Feel Better  1st Wednesday of the month 10am-12 noon, Journey Room (Call [place] to register [phone])

## 2016-06-25 NOTE — Progress Notes (Signed)
Robert Bellow, MD [place] [address]  Anemia in stage 4 chronic kidney disease [facility]) - Plan: Comprehensive metabolic panel  Anemia due to GI blood loss  CURRENT THERAPY: Aranesp 60 mcg every 4 weeks.    INTERVAL HISTORY: [name] 71 y.o. female returns for followup of anemia of chronic renal disease, on ESA therapy every 4 weeks.  She is doing well.  She continues to demonstrate a good response to therapy with Aranesp.    I have educated her on 1/3 positive stools cards.  I have recommended GI referral, but at this time, she would rather decline this referral.  She is seeing a nephrologist, Dr. Joelyn Oms.  She notes that some of her medications were altered in an attempt to maintain or improve her renal function.    She notes BRBPR on toilet paper and in stool when she strains only.    Review of Systems  Constitutional: Negative.  Negative for chills, fever, malaise/fatigue and weight loss.  HENT: Negative.   Eyes: Negative.   Respiratory: Negative.  Negative for hemoptysis.   Cardiovascular: Negative.  Negative for chest pain.  Gastrointestinal: Positive for blood in stool. Negative for abdominal pain, constipation, diarrhea, melena, nausea and vomiting.  Genitourinary: Negative.  Negative for dysuria, hematuria and urgency.  Musculoskeletal: Negative.  Negative for falls.  Skin: Negative.   Neurological: Negative.   Endo/Heme/Allergies: Negative.   Psychiatric/Behavioral: Negative.     Past Medical History:  Diagnosis Date  . Anemia    a. mild/chronic  . Anemia due to GI blood loss 02/26/2016  . Anemia in chronic renal disease 11/30/2014  . Cardiomyopathy, nonischemic ([place])    a. 1999 nl cath;  b. 12/05 [facility] [place];  c. 10/2005 ICD extraction 2/2 enterococcus bacteremia and Veg on RV lead;  c. 05/2008 low risk Myoview (scarring w/ some evidence of inf ischemia);  d. 11/2012 Echo: EF 15-20%;  e. 01/2013 s/p MDT [place] CRT D, ser #  [id number];  f. 05/2013 Echo: EF 15%.  . Chronic systolic CHF (congestive heart failure) ([place])    a. 11/2012 Echo: EF 15-20%;  b. 05/2013 TEE EF 15%.  . CKD (chronic kidney disease), stage III    creatinin-1.44 in 1/09; 1.51 in 1/10  . Degenerative joint disease    of knees, shoulder, and hips  . Enterococcal infection    a. 10/2005 - AICD-explanted  . GERD (gastroesophageal reflux disease)   . Hilar density    a. infrahilar mass/adenopathy on CT scan 5/07; subsequently  resolved  . History of blood transfusion   . Hyperlipidemia   . Hypertension   . ICD (implantable cardioverter-defibrillator) infection ([place])    removed 2016  . Implantable cardioverter-defibrillator-CRT- Mdt    a.  01/2013 s/p MDT V[place] XT CRT D, ser # [id number]  . LBBB (left bundle branch block)   . Obstructive sleep apnea    a. mild-did not tolerate CPAP (01/24/2013)  . PAF (paroxysmal atrial fibrillation) ([place])    a. 07/2012 s/p TEE/DCCV;  b. chronic coumadin;  c. 05/2013 Recurrent Afib->TEE/DCCV and amio initiation.  . Pneumonia 07/2005  . Pulmonary embolism ([place])    a. 07/2005 after total right hip arthroplasty  . Tobacco abuse    a. discontinued in 1997, and then resumed  . Type II diabetes mellitus ([place])    type 2  . Urinary incontinence   . Villous adenoma of colon    a. tubovillous  adenomatous polyp with focal high grade dysplasia; presented with hematochezia - followed by Dr. Laural Golden.    Past Surgical History:  Procedure Laterality Date  . A-V CARDIAC PACEMAKER INSERTION  12/05   Biventricular pacemaker/AICD  . ABDOMINAL HYSTERECTOMY  1990/92   Initial partial hysterectomy followed by BSO  . BI-VENTRICULAR IMPLANTABLE CARDIOVERTER DEFIBRILLATOR N/A 01/24/2013   Procedure: BI-VENTRICULAR IMPLANTABLE CARDIOVERTER DEFIBRILLATOR  (CRT-D);  Surgeon: Evans Lance, MD;  Location: [facility] CATH LAB;  Service: Cardiovascular;  Laterality: N/A;  . BI-VENTRICULAR IMPLANTABLE CARDIOVERTER DEFIBRILLATOR  (CRT-D)  01/24/2013    . CARDIAC CATHETERIZATION    . CARDIOVERSION N/A 08/20/2012   Procedure: TEE GUIDED CARDIOVERSION;  Surgeon: Yehuda Savannah, MD;  Location: [facility] OR[facility];  Service: Cardiovascular;  Laterality: N/A;  To be done @ bedside  . CARDIOVERSION N/A 06/20/2013   Procedure: CARDIOVERSION;  Surgeon: Dorothy Spark, MD;  Location: [place];  Service: Cardiovascular;  Laterality: N/A;  . CATARACT EXTRACTION W/PHACO Left 08/07/2015   Procedure: CATARACT EXTRACTION PHACO AND INTRAOCULAR LENS PLACEMENT ([place]);  Surgeon: Rutherford Guys, MD;  Location: [facility] ORS;  Service: Ophthalmology;  Laterality: Left;  CDE: 7.88  . CATARACT EXTRACTION W/PHACO Right 08/21/2015   Procedure: CATARACT EXTRACTION PHACO AND INTRAOCULAR LENS PLACEMENT (IOC);  Surgeon: Rutherford Guys, MD;  Location: [facility];  Service: Ophthalmology;  Laterality: Right;  CDE:8.55  . COLONOSCOPY Left 10/24/2014   Procedure: COLONOSCOPY;  Surgeon: Carol Ada, MD;  Location: [facility] ENDOSCOPY;  Service: Endoscopy;  Laterality: Left;  . COLONOSCOPY W/ POLYPECTOMY  2009  . COLONOSCOPY WITH ESOPHAGOGASTRODUODENOSCOPY (EGD) N/A 06/10/2013   Procedure: COLONOSCOPY WITH ESOPHAGOGASTRODUODENOSCOPY (EGD);  Surgeon: Rogene Houston, MD;  [facility];  Service: Endoscopy;  Laterality: N/A;  925  . ESOPHAGOGASTRODUODENOSCOPY N/A 10/21/2014   Procedure: ESOPHAGOGASTRODUODENOSCOPY (EGD);  Surgeon: Inda Castle, MD;  Location: [place];  Service: Endoscopy;  Laterality: N/A;  . GIVENS CAPSULE STUDY N/A 10/24/2014   Procedure: GIVENS CAPSULE STUDY;  Surgeon: Carol Ada, MD;  Location: [place];  Service: Endoscopy;  Laterality: N/A;  . ICD LEAD REMOVAL N/A 12/13/2014   Procedure: ICD LEAD REMOVAL/EXTRACTION ;  Surgeon: Evans Lance, MD;  Location: [place];  Service: Cardiovascular;  Laterality: N/A;  [place] back up  . KNEE ARTHROSCOPY Right 1980's?  Marland Kitchen PACEMAKER REMOVAL  11/18/05   Enterococcal infection  . RIGHT HEART CATHETERIZATION N/A 04/18/2014    Procedure: RIGHT HEART CATH;  Surgeon: Larey Dresser, MD;  Location[facility] CATH LAB;  Service: Cardiovascular;  Laterality: N/A;  . TEE WITHOUT CARDIOVERSION N/A 08/20/2012   Procedure: TRANSESOPHAGEAL ECHOCARDIOGRAM (TEE);  Surgeon: Yehuda Savannah, MD;  Location: [facility];  Service: Cardiovascular;  Laterality: N/A;  . TEE WITHOUT CARDIOVERSION N/A 06/20/2013   Procedure: TRANSESOPHAGEAL ECHOCARDIOGRAM (TEE);  Surgeon: Dorothy Spark, MD;  Location: [place];  Service: Cardiovascular;  Laterality: N/A;  . TEE WITHOUT CARDIOVERSION N/A 12/08/2014   Procedure: TRANSESOPHAGEAL ECHOCARDIOGRAM (TEE);  Surgeon: Fay Records, MD;  Location[facility];  Service: Cardiovascular;  Laterality: N/A;  . TOTAL HIP ARTHROPLASTY Right 07/2005  . TUBAL LIGATION  1980's    Family History  Problem Relation Age of Onset  . Hypertension Mother   . Diabetes Mother   . Coronary artery disease Father   . Diabetes Brother   . Hypertension Brother   . Lung cancer Brother   . Arthritis Other   . Diabetes Other   . Heart disease Other     female < 68  Social History   Social History  . Marital status: Married    Spouse name: N/A  . Number of children: N/A  . Years of education: N/A   Social History Main Topics  . Smoking status: Former Smoker    Years: 12.00    Types: Cigarettes  . Smokeless tobacco: Never Used     Comment: 05/2013: Smokes an occasional cigarette.  Says that she doesn't inhale.  . Alcohol use No  . Drug use: No  . Sexual activity: Not Currently    Birth control/ protection: None   Other Topics Concern  . Not on file   Social History Narrative   Married, lives in [place] with spouse. Retired Secretary/administrator.      PHYSICAL EXAMINATION  ECOG PERFORMANCE STATUS: 1 - Symptomatic but completely ambulatory  Vitals:   06/25/16 1402  BP: (!) 110/53  Pulse: 65  Resp: 18  Temp: 97.7 F (36.5 C)    GENERAL:alert, no distress, well nourished, well developed,  comfortable, cooperative, smiling and accompanied by her husband SKIN: skin color, texture, turgor are normal, no rashes or significant lesions HEAD: Normocephalic, No masses, lesions, tenderness or abnormalities EYES: normal, EOMI, Conjunctiva are pink and non-injected EARS: External ears normal OROPHARYNX:lips, buccal mucosa, and tongue normal and mucous membranes are moist  NECK: supple, trachea midline LYMPH:  no palpable lymphadenopathy BREAST:not examined LUNGS: clear to auscultation  HEART: regular rate & rhythm, no murmurs, S1 normal, S2 normal, S4 and 2/6,  LLSB systolic murmur.   ABDOMEN:abdomen soft, non-tender and normal bowel sounds BACK: Back symmetric, no curvature. EXTREMITIES:less then 2 second capillary refill, no skin discoloration, no cyanosis  NEURO: alert & oriented x 3 with fluent speech, no focal motor/sensory deficits, gait normal with a rolling walker   LABORATORY DATA: CBC    Component Value Date/Time   WBC 6.3 06/25/2016 1336   RBC 3.20 (L) 06/25/2016 1336   HGB 10.5 (L) 06/25/2016 1336   HCT 31.4 (L) 06/25/2016 1336   PLT 203 06/25/2016 1336   MCV 98.1 06/25/2016 1336   MCH 32.8 06/25/2016 1336   MCHC 33.4 06/25/2016 1336   RDW 12.5 06/25/2016 1336   LYMPHSABS 1.8 06/25/2016 1336   MONOABS 0.4 06/25/2016 1336   EOSABS 0.1 06/25/2016 1336   BASOSABS 0.0 06/25/2016 1336      Chemistry      Component Value Date/Time   NA 135 04/28/2016 1250   K 4.6 04/28/2016 1250   CL 103 04/28/2016 1250   CO2 25 04/28/2016 1250   BUN 48 (H) 04/28/2016 1250   CREATININE 2.82 (H) 04/28/2016 1250   CREATININE 1.17 (H) 01/20/2013 1010      Component Value Date/Time   CALCIUM 9.6 04/28/2016 1250   ALKPHOS 66 04/28/2016 1250   AST 14 (L) 04/28/2016 1250   ALT 10 (L) 04/28/2016 1250   BILITOT 0.9 04/28/2016 1250     Lab Results  Component Value Date   FERRITIN 189 04/28/2016     PENDING LABS:   RADIOGRAPHIC STUDIES:  No results  found.   PATHOLOGY:    ASSESSMENT AND PLAN:  Anemia in chronic renal disease Anemia of chronic renal disease, currently on Aranesp 60 mcg every 4 weeks, due to her low need for ESA.  Labs today: CBC diff, ferritin.  I personally reviewed and went over laboratory results with the patient.  The results are noted within this dictation.  In July, an acute drop in HGB was noted and therefore stool cards x 3  were performed.  1/3 was positive for occult blood.   She refuses GI referral at this time, but if HGB drops again and stool cards are positive, she is agreeable to referral at that time.  She again refuses referral.   Supportive therapy plan is re-built and reviewed.  Labs every 4 weeks: CBC  Labs every 90 days: Ferritin.    Labs in 4 months: CMET.  Continue same dose of Aranesp 60 mcg every 4 weeks.  Return in 4 months for follow-up.  Anemia due to GI blood loss Anemia of GI blood loss with 1/3 + stool cards.  Previously seen by Dr. Carol Ada.    She notes BRBPR on toilet paper and intermittently in stool when straining to move bowels.  She is given a constipation sheet for recommendations regarding bowel regimen.  Refuses GI referral.   ORDERS PLACED FOR THIS ENCOUNTER: Orders Placed This Encounter  Procedures  . Comprehensive metabolic panel    MEDICATIONS PRESCRIBED THIS ENCOUNTER: Meds ordered this encounter  Medications  . calcitRIOL (ROCALTROL) 0.25 MCG capsule    Sig: Take 0.25 mcg by mouth daily.  . polyethylene glycol powder (GLYCOLAX/MIRALAX) powder    Sig: daily as needed.    THERAPY PLAN:  Continue Aranesp every 4 weeks with change in frequency based upon her response moving forward.  All questions were answered. The patient knows to call the clinic with any problems, questions or concerns. We can certainly see the patient much sooner if necessary.  Patient and plan discussed with Dr. Ancil Linsey and she is in agreement with the  aforementioned.   This note is electronically signed by: Doy Mince 06/25/2016 2:43 PM

## 2016-06-25 NOTE — Patient Instructions (Signed)
[facility] at [facility] Discharge Instructions  RECOMMENDATIONS MADE BY THE CONSULTANT AND ANY TEST RESULTS WILL BE SENT TO YOUR REFERRING PHYSICIAN.  Aranesp today.    Thank you for choosing [facility] at [facility] to provide your oncology and hematology care.  To afford each patient quality time with our provider, please arrive at least 15 minutes before your scheduled appointment time.    If you have a lab appointment with the Cancer Center please come in thru the  Main Entrance and check in at the main information desk  You need to re-schedule your appointment should you arrive 10 or more minutes late.  We strive to give you quality time with our providers, and arriving late affects you and other patients whose appointments are after yours.  Also, if you no show three or more times for appointments you may be dismissed from the clinic at the providers discretion.     Again, thank you for choosing [facility].  Our hope is that these requests will decrease the amount of time that you wait before being seen by our physicians.       _____________________________________________________________  Should you have questions after your visit to  Cancer Center, please contact our office at [phone] between the hours of 8:30 a.m. and 4:30 p.m.  Voicemails left after 4:30 p.m. will not be returned until the following business day.  For prescription refill requests, have your pharmacy contact our office.       Resources For Cancer Patients and their Caregivers ? American Cancer Society: Can assist with transportation, wigs, general needs, runs Look Good Feel Better.        1-888-227-6333 ? Cancer Care: Provides financial assistance, online support groups, medication/co-pay assistance.  1-800-813-HOPE (4673) ? [facility] Assists [place] cancer patients and their families through emotional  , educational and financial support.  336-427-4357 ? [place] DSS Where to apply for food stamps, Medicaid and utility assistance. 336-342-1394 ? RCATS: Transportation to medical appointments. 336-347-2287 ? Social Security Administration: May apply for disability if have a Stage IV cancer. 336-342-7796 1-800-772-1213 ? [place] Aging, Disability and Transit Services: Assists with nutrition, care and transit needs. 336-349-2343  Cancer Center Support Programs: @10RELATIVEDAYS@ > Cancer Support Group  2nd Tuesday of the month 1pm-2pm, Journey Room  > Creative Journey  3rd Tuesday of the month 1130am-1pm, Journey Room  > Look Good Feel Better  1st Wednesday of the month 10am-12 noon, Journey Room (Call American Cancer Society to register 1-800-395-5775)    

## 2016-06-25 NOTE — Assessment & Plan Note (Addendum)
Anemia of chronic renal disease, currently on Aranesp 60 mcg every 4 weeks, due to her low need for ESA.  Labs today: CBC diff, ferritin.  I personally reviewed and went over laboratory results with the patient.  The results are noted within this dictation.  In July, an acute drop in HGB was noted and therefore stool cards x 3 were performed.  1/3 was positive for occult blood.   She refuses GI referral at this time, but if HGB drops again and stool cards are positive, she is agreeable to referral at that time.  She again refuses referral.   Supportive therapy plan is re-built and reviewed.  Labs every 4 weeks: CBC  Labs every 90 days: Ferritin.    Labs in 4 months: CMET.  Continue same dose of Aranesp 60 mcg every 4 weeks.  Return in 4 months for follow-up.

## 2016-06-25 NOTE — Assessment & Plan Note (Addendum)
Anemia of GI blood loss with 1/3 + stool cards.  Previously seen by Dr. Carol Ada.    She notes BRBPR on toilet paper and intermittently in stool when straining to move bowels.  She is given a constipation sheet for recommendations regarding bowel regimen.  Refuses GI referral.

## 2016-06-29 NOTE — Procedures (Signed)
   Patient Name: [name], [name] Date: 06/25/2016 Gender: Not Specified D.O.B: Aug 30, 1945 Age (years): 70 Referring Provider: Loralie Champagne Height (inches): 65 Interpreting Physician: Tina Him MD, ABSM Weight (lbs): 173 RPSGT: Laren Everts BMI: 29 MRN: [id number] Neck Size: 15.00  CLINICAL INFORMATION The patient is referred for a CPAP titration to treat sleep apnea. Date of NPSG, Split Night or HST:04/23/2016  SLEEP STUDY TECHNIQUE As per the AASM Manual for the Scoring of Sleep and Associated Events v2.3 (April 2016) with a hypopnea requiring 4% desaturations.  The channels recorded and monitored were frontal, central and occipital EEG, electrooculogram (EOG), submentalis EMG (chin), nasal and oral airflow, thoracic and abdominal wall motion, anterior tibialis EMG, snore microphone, electrocardiogram, and pulse oximetry. Continuous positive airway pressure (CPAP) was initiated at the beginning of the study and titrated to treat sleep-disordered breathing.  MEDICATIONS Medications self-administered by patient taken the night of the study : FERROUS SULFATE, LASIX, PRAVASTATIN, COUMADIN, COREG  TECHNICIAN COMMENTS Comments added by technician: NONE  Comments added by scorer: N/A  RESPIRATORY PARAMETERS Optimal PAP Pressure (cm): 11  AHI at Optimal Pressure (/hr):2.4 Overall Minimal O2 (%):89.00  Supine % at Optimal Pressure (%):100 Minimal O2 at Optimal Pressure (%): 92.0    SLEEP ARCHITECTURE The study was initiated at 10:51:12 PM and ended at 5:03:09 AM.  Sleep onset time was 1.1 minutes and the sleep efficiency was 82.4%. The total sleep time was 306.5 minutes.  The patient spent 5.06% of the night in stage N1 sleep, 75.20% in stage N2 sleep, 1.31% in stage N3 and 18.43% in REM.Stage REM latency was 104.0 minutes  Wake after sleep onset was 64.3. Alpha intrusion was absent. Supine sleep was 84.34%.  CARDIAC DATA The 2 lead EKG demonstrated sinus rhythm.  The mean heart rate was 56.95 beats per minute. Other EKG findings include: PVCs.  LEG MOVEMENT DATA The total Periodic Limb Movements of Sleep (PLMS) were 0. The PLMS index was 0.00. A PLMS index of <15 is considered normal in adults.  IMPRESSIONS - The optimal PAP pressure was 11 cm of water. - Central sleep apnea was not noted during this titration (CAI = 3.7/h). - Mild oxygen desaturations were observed during this titration (min O2 = 89.00%). - No snoring was audible during this study. - 2-lead EKG demonstrated: PVCs - Clinically significant periodic limb movements were not noted during this study. Arousals associated with PLMs were rare.  DIAGNOSIS - Obstructive Sleep Apnea (327.23 [G47.33 ICD-10])  RECOMMENDATIONS - Trial of CPAP therapy on 11 cm H2O with a Medium Pillow size Resmed Full Face Mask Mirage Liberty Frame (S) mask and heated humidification. - Avoid alcohol, sedatives and other CNS depressants that may worsen sleep apnea and disrupt normal sleep architecture. - Sleep hygiene should be reviewed to assess factors that may improve sleep quality. - Weight management and regular exercise should be initiated or continued. - Return to Sleep Center for re-evaluation after 10 weeks of therapy  [name], [place] [facility]  ELECTRONICALLY SIGNED ON:  06/29/2016, 2:53 PM [place] PH: [phone]   FX: [phone] [place]

## 2016-06-30 NOTE — Progress Notes (Signed)
Called the patient and gave her the sleep study results, she verbalized understanding 

## 2016-07-23 ENCOUNTER — Encounter (HOSPITAL_COMMUNITY): Payer: Medicare HMO

## 2016-07-23 ENCOUNTER — Encounter (HOSPITAL_BASED_OUTPATIENT_CLINIC_OR_DEPARTMENT_OTHER): Payer: Medicare HMO

## 2016-07-23 VITALS — BP 121/55 | HR 66 | Temp 98.1°F | Resp 18

## 2016-07-23 DIAGNOSIS — D649 Anemia, unspecified: Secondary | ICD-10-CM

## 2016-07-23 DIAGNOSIS — N184 Chronic kidney disease, stage 4 (severe): Secondary | ICD-10-CM

## 2016-07-23 DIAGNOSIS — D631 Anemia in chronic kidney disease: Secondary | ICD-10-CM

## 2016-07-23 DIAGNOSIS — N189 Chronic kidney disease, unspecified: Principal | ICD-10-CM

## 2016-07-23 LAB — CBC WITH DIFFERENTIAL/PLATELET
BASOS ABS: 0 10*3/uL (ref 0.0–0.1)
BASOS PCT: 0 %
Eosinophils Absolute: 0.1 10*3/uL (ref 0.0–0.7)
Eosinophils Relative: 1 %
HEMATOCRIT: 31.8 % — AB (ref 36.0–46.0)
HEMOGLOBIN: 10.6 g/dL — AB (ref 12.0–15.0)
Lymphocytes Relative: 21 %
Lymphs Abs: 1.3 10*3/uL (ref 0.7–4.0)
MCH: 32.3 pg (ref 26.0–34.0)
MCHC: 33.3 g/dL (ref 30.0–36.0)
MCV: 97 fL (ref 78.0–100.0)
MONO ABS: 0.4 10*3/uL (ref 0.1–1.0)
Monocytes Relative: 7 %
NEUTROS ABS: 4.4 10*3/uL (ref 1.7–7.7)
NEUTROS PCT: 71 %
Platelets: 195 10*3/uL (ref 150–400)
RBC: 3.28 MIL/uL — AB (ref 3.87–5.11)
RDW: 12.7 % (ref 11.5–15.5)
WBC: 6.2 10*3/uL (ref 4.0–10.5)

## 2016-07-23 LAB — FERRITIN: Ferritin: 170 ng/mL (ref 11–307)

## 2016-07-23 MED ORDER — DARBEPOETIN ALFA 60 MCG/0.3ML IJ SOSY
PREFILLED_SYRINGE | INTRAMUSCULAR | Status: AC
  Filled 2016-07-23: qty 0.3

## 2016-07-23 MED ORDER — DARBEPOETIN ALFA 60 MCG/0.3ML IJ SOSY
60.0000 ug | PREFILLED_SYRINGE | Freq: Once | INTRAMUSCULAR | Status: AC
  Administered 2016-07-23: 60 ug via SUBCUTANEOUS

## 2016-07-23 NOTE — Patient Instructions (Signed)
[place] at [facility] Discharge Instructions  RECOMMENDATIONS MADE BY THE CONSULTANT AND ANY TEST RESULTS WILL BE SENT TO YOUR REFERRING PHYSICIAN.  Hemoglobin 10.6. Aranesp 60 mcg injection given as ordered. Return as scheduled.  Thank you for choosing [place] at [facility] to provide your oncology and hematology care.  To afford each patient quality time with our provider, please arrive at least 15 minutes before your scheduled appointment time.    If you have a lab appointment with the [place] please come in thru the  Main Entrance and check in at the main information desk  You need to re-schedule your appointment should you arrive 10 or more minutes late.  We strive to give you quality time with our providers, and arriving late affects you and other patients whose appointments are after yours.  Also, if you no show three or more times for appointments you may be dismissed from the clinic at the providers discretion.     Again, thank you for choosing [facility].  Our hope is that these requests will decrease the amount of time that you wait before being seen by our physicians.       _____________________________________________________________  Should you have questions after your visit to [facility], please contact our office at [phone] between the hours of 8:30 a.m. and 4:30 p.m.  Voicemails left after 4:30 p.m. will not be returned until the following business day.  For prescription refill requests, have your pharmacy contact our office.       Resources For Cancer Patients and their Caregivers ? American Cancer Society: Can assist with transportation, wigs, general needs, runs Look Good Feel Better.        9151340681 ? Cancer Care: Provides financial assistance, online support groups, medication/co-pay assistance.  1-800-813-HOPE (248)649-4507) ? [place] Assists  [place] cancer patients and their families through emotional , educational and financial support.  (605)257-1757 ? [place] DSS Where to apply for food stamps, Medicaid and utility assistance. 607-087-5959 ? RCATS: Transportation to medical appointments. (318)627-4103 ? Social Security Administration: May apply for disability if have a Stage IV cancer. 702-240-4621 (845)327-3494 ? [employer], Disability and Transit Services: Assists with nutrition, care and transit needs. [place] Support Programs: @10RELATIVEDAYS @ > Cancer Support Group  2nd Tuesday of the month 1pm-2pm, Journey Room  > Creative Journey  3rd Tuesday of the month 1130am-1pm, Journey Room  > Look Good Feel Better  1st Wednesday of the month 10am-12 noon, Journey Room (Call [place] to register [phone])

## 2016-07-23 NOTE — Progress Notes (Signed)
[name] presents today for injection per MD orders. Aranesp 60mcg administered SQ in right Abdomen. Administration without incident. Patient tolerated well.  

## 2016-07-24 ENCOUNTER — Other Ambulatory Visit (HOSPITAL_COMMUNITY): Payer: Self-pay | Admitting: Family Medicine

## 2016-07-24 DIAGNOSIS — Z1231 Encounter for screening mammogram for malignant neoplasm of breast: Secondary | ICD-10-CM

## 2016-07-25 ENCOUNTER — Ambulatory Visit (HOSPITAL_COMMUNITY)
Admission: RE | Admit: 2016-07-25 | Discharge: 2016-07-25 | Disposition: A | Discharge status: Home / Self Care | Payer: Medicare HMO | Source: Ambulatory Visit | Attending: Family Medicine | Admitting: Family Medicine

## 2016-07-25 DIAGNOSIS — Z1231 Encounter for screening mammogram for malignant neoplasm of breast: Secondary | ICD-10-CM | POA: Insufficient documentation

## 2016-08-20 ENCOUNTER — Ambulatory Visit (HOSPITAL_COMMUNITY): Payer: Medicare HMO

## 2016-08-20 ENCOUNTER — Other Ambulatory Visit (HOSPITAL_COMMUNITY): Payer: Medicare HMO

## 2016-09-14 DIAGNOSIS — G4733 Obstructive sleep apnea (adult) (pediatric): Secondary | ICD-10-CM | POA: Insufficient documentation

## 2016-09-14 NOTE — Progress Notes (Signed)
Cardiology Office Note    Date:  09/15/2016   ID:  [name], [name] 08/16/1945, MRN [id number]  PCP:  Robert Bellow, MD  Cardiologist: Loralie Champagne, MD   Chief Complaint  Patient presents with  . Sleep Apnea  . Hypertension    History of Present Illness:  [name] is a 71 y.o. female who is referred by Dr. Aundra Dubin for evaluation of OSA.  A PSG was ordered due to CHF.  According to her husband she snored at night prior to CPAP.  She also noticed that she would feel sleepy when she would wake up and would nap throughout the day.   She recently underwent a PSG 04/2016 for OSA which showed moderate OSA with an AHI of 19/hr with subsequent CPAP titration to 11cm H2O.  She is now here for followup.  She is tolerating her CPAP well.  She feels the pressure is adequate but she is having  problems with her full face mask.  She says that the pillows keep popping out of her nose and the mask leaks.  Since going on her CPAP, she feels more rested in the am but still has daytime sleepiness.  She goes to bed at 10pm but cannot go to sleep until she takes the mask off.   She denies any mouth or nasal dryness and no significant nasal congestion.      Past Medical History:  Diagnosis Date  . Anemia    a. mild/chronic  . Anemia due to GI blood loss 02/26/2016  . Anemia in chronic renal disease 11/30/2014  . Cardiomyopathy, nonischemic ([place])    a. 1999 nl cath;  b. 12/05 [place] [place];  c. 10/2005 ICD extraction 2/2 enterococcus bacteremia and Veg on RV lead;  c. 05/2008 low risk Myoview (scarring w/ some evidence of inf ischemia);  d. 11/2012 Echo: EF 15-20%;  e. 01/2013 s/p MDT [place] CRT D, ser # [id number];  f. 05/2013 Echo: EF 15%.  . Chronic systolic CHF (congestive heart failure) ([place])    a. 11/2012 Echo: EF 15-20%;  b. 05/2013 TEE EF 15%.  . CKD (chronic kidney disease), stage III    creatinin-1.44 in 1/09; 1.51 in 1/10  . Degenerative joint disease    of knees, shoulder, and hips  .  Enterococcal infection    a. 10/2005 - AICD-explanted  . GERD (gastroesophageal reflux disease)   . Hilar density    a. infrahilar mass/adenopathy on CT scan 5/07; subsequently  resolved  . History of blood transfusion   . Hyperlipidemia   . Hypertension   . ICD (implantable cardioverter-defibrillator) infection ([place])    removed 2016  . Implantable cardioverter-defibrillator-CRT- Mdt    a.  01/2013 s/p MDT Viva XT CRT D, ser # [id number]  . LBBB (left bundle branch block)   . Obstructive sleep apnea    a. mild-did not tolerate CPAP (01/24/2013)  . PAF (paroxysmal atrial fibrillation) ([place])    a. 07/2012 s/p TEE/DCCV;  b. chronic coumadin;  c. 05/2013 Recurrent Afib->TEE/DCCV and amio initiation.  . Pneumonia 07/2005  . Pulmonary embolism ([place])    a. 07/2005 after total right hip arthroplasty  . Tobacco abuse    a. discontinued in 1997, and then resumed  . Type II diabetes mellitus ([place])    type 2  . Urinary incontinence   . Villous adenoma of colon    a. tubovillous adenomatous polyp with focal high grade dysplasia; presented with hematochezia - followed by Dr.  Rehman.    Past Surgical History:  Procedure Laterality Date  . A-V CARDIAC PACEMAKER INSERTION  12/05   Biventricular pacemaker/AICD  . ABDOMINAL HYSTERECTOMY  1990/92   Initial partial hysterectomy followed by BSO  . BI-VENTRICULAR IMPLANTABLE CARDIOVERTER DEFIBRILLATOR N/A 01/24/2013   Procedure: BI-VENTRICULAR IMPLANTABLE CARDIOVERTER DEFIBRILLATOR  (CRT-D);  Surgeon: Evans Lance, MD;  Location: [facility] CATH LAB;  Service: Cardiovascular;  Laterality: N/A;  . BI-VENTRICULAR IMPLANTABLE CARDIOVERTER DEFIBRILLATOR  (CRT-D)  01/24/2013  . CARDIAC CATHETERIZATION    . CARDIOVERSION N/A 08/20/2012   Procedure: TEE GUIDED CARDIOVERSION;  Surgeon: Yehuda Savannah, MD;  Location: [facility];  Service: Cardiovascular;  Laterality: N/A;  To be done @ bedside  . CARDIOVERSION N/A 06/20/2013   Procedure: CARDIOVERSION;  Surgeon: Dorothy Spark, MD;  Location: [place];  Service: Cardiovascular;  Laterality: N/A;  . CATARACT EXTRACTION W/PHACO Left 08/07/2015   Procedure: CATARACT EXTRACTION PHACO AND INTRAOCULAR LENS PLACEMENT ([place]);  Surgeon: Rutherford Guys, MD;  Location: [facility];  Service: Ophthalmology;  Laterality: Left;  CDE: 7.88  . CATARACT EXTRACTION W/PHACO Right 08/21/2015   Procedure: CATARACT EXTRACTION PHACO AND INTRAOCULAR LENS PLACEMENT (IOC);  Surgeon: Rutherford Guys, MD;  Location: [facility];  Service: Ophthalmology;  Laterality: Right;  CDE:8.55  . COLONOSCOPY Left 10/24/2014   Procedure: COLONOSCOPY;  Surgeon: Carol Ada, MD;  Location[facility] ENDOSCOPY;  Service: Endoscopy;  Laterality: Left;  . COLONOSCOPY W/ POLYPECTOMY  2009  . COLONOSCOPY WITH ESOPHAGOGASTRODUODENOSCOPY (EGD) N/A 06/10/2013   Procedure: COLONOSCOPY WITH ESOPHAGOGASTRODUODENOSCOPY (EGD);  Surgeon: Rogene Houston, MD;  [facility];  Service: Endoscopy;  Laterality: N/A;  925  . ESOPHAGOGASTRODUODENOSCOPY N/A 10/21/2014   Procedure: ESOPHAGOGASTRODUODENOSCOPY (EGD);  Surgeon: Inda Castle, MD;  Location: [place];  Service: Endoscopy;  Laterality: N/A;  . GIVENS CAPSULE STUDY N/A 10/24/2014   Procedure: GIVENS CAPSULE STUDY;  Surgeon: Carol Ada, MD;  Location: [place];  Service: Endoscopy;  Laterality: N/A;  . ICD LEAD REMOVAL N/A 12/13/2014   Procedure: ICD LEAD REMOVAL/EXTRACTION ;  Surgeon: Evans Lance, MD;  Location: [place];  Service: Cardiovascular;  Laterality: N/A;  [place] back up  . KNEE ARTHROSCOPY Right 1980's?  Marland Kitchen PACEMAKER REMOVAL  11/18/05   Enterococcal infection  . RIGHT HEART CATHETERIZATION N/A 04/18/2014   Procedure: RIGHT HEART CATH;  Surgeon: Larey Dresser, MD;  Location: [facility] CATH LAB;  Service: Cardiovascular;  Laterality: N/A;  . TEE WITHOUT CARDIOVERSION N/A 08/20/2012   Procedure: TRANSESOPHAGEAL ECHOCARDIOGRAM (TEE);  Surgeon: Yehuda Savannah, MD;  [facility];  Service: Cardiovascular;   Laterality: N/A;  . TEE WITHOUT CARDIOVERSION N/A 06/20/2013   Procedure: TRANSESOPHAGEAL ECHOCARDIOGRAM (TEE);  Surgeon: Dorothy Spark, MD;  Location: [place];  Service: Cardiovascular;  Laterality: N/A;  . TEE WITHOUT CARDIOVERSION N/A 12/08/2014   Procedure: TRANSESOPHAGEAL ECHOCARDIOGRAM (TEE);  Surgeon: Fay Records, MD;  [facility];  Service: Cardiovascular;  Laterality: N/A;  . TOTAL HIP ARTHROPLASTY Right 07/2005  . TUBAL LIGATION  1980's    Current Medications: Current Meds  Medication Sig  . ACCU-CHEK AVIVA PLUS test strip   . amiodarone (PACERONE) 200 MG tablet Take 0.5 tablets (100 mg total) by mouth daily.  . calcitRIOL (ROCALTROL) 0.25 MCG capsule Take 0.25 mcg by mouth daily.  . carvedilol (COREG) 6.25 MG tablet TAKE 1 TABLET BY MOUTH TWICE DAILY WITH A MEAL.  Marland Kitchen colchicine 0.6 MG tablet Take 0.6 mg by mouth 2 (two) times daily as needed (gout).  Reported on 11/06/2015  . feeding supplement, ENSURE ENLIVE, (ENSURE ENLIVE) LIQD Take 237 mLs by mouth 2 (two) times daily between meals.  . ferrous sulfate 324 (65 FE) MG TBEC Take 1 tablet (325 mg total) by mouth 2 (two) times daily.  . furosemide (LASIX) 40 MG tablet TAKE 1 TABLET BY MOUTH TWICE DAILY.  Marland Kitchen glimepiride (AMARYL) 2 MG tablet Take 2 mg by mouth daily.  . isosorbide mononitrate (IMDUR) 30 MG 24 hr tablet Take 1 tablet (30 mg total) by mouth daily.  Marland Kitchen levothyroxine (SYNTHROID, LEVOTHROID) 25 MCG tablet Take 1 tablet (25 mcg total) by mouth daily before breakfast.  . lisinopril (PRINIVIL,ZESTRIL) 10 MG tablet TAKE ONE TABLET BY MOUTH DAILY.  Marland Kitchen metoCLOPramide (REGLAN) 5 MG tablet Take 5 mg by mouth 2 (two) times daily.   . ondansetron (ZOFRAN) 4 MG tablet Take 4 mg by mouth every 4 (four) hours as needed for nausea or vomiting. Reported on 11/06/2015  . oxyCODONE-acetaminophen (PERCOCET) 10-325 MG per tablet Take 1 tablet by mouth every 6 (six) hours as needed for pain.   . polyethylene glycol (MIRALAX /  GLYCOLAX) packet Take 17 g by mouth daily as needed for mild constipation.  . pravastatin (PRAVACHOL) 40 MG tablet Take 80 mg by mouth at bedtime.   Marland Kitchen spironolactone (ALDACTONE) 25 MG tablet TAKE 1/2 TABLET BY MOUTH DAILY.  Marland Kitchen warfarin (COUMADIN) 2.5 MG tablet Take 0.5-1 tablets (1.25-2.5 mg total) by mouth daily at 6 PM. Takes 1 tablet on Mon, Wed, Fri, take 0.5 tablet on all other days  . [DISCONTINUED] polyethylene glycol powder (GLYCOLAX/MIRALAX) powder daily as needed.    Allergies:   Patient has no known allergies.   Social History   Social History  . Marital status: Married    Spouse name: N/A  . Number of children: N/A  . Years of education: N/A   Social History Main Topics  . Smoking status: Former Smoker    Years: 12.00    Types: Cigarettes  . Smokeless tobacco: Never Used     Comment: 05/2013: Smokes an occasional cigarette.  Says that she doesn't inhale.  . Alcohol use No  . Drug use: No  . Sexual activity: Not Currently    Birth control/ protection: None   Other Topics Concern  . None   Social History Narrative   Married, lives in [place] with spouse. Retired Secretary/administrator.      Family History:  The patient's \ family history includes Arthritis in her other; Coronary artery disease in her father; Diabetes in her brother, mother, and other; Heart disease in her other; Hypertension in her brother and mother; Lung cancer in her brother.   ROS:   Please see the history of present illness.    ROS All other systems reviewed and are negative.  No flowsheet data found.     PHYSICAL EXAM:   VS:  BP 100/62   Pulse 77   Ht 5\' 5"  (1.651 m)   Wt 182 lb 6.4 oz (82.7 kg)   SpO2 93%   BMI 30.35 kg/m    GEN: Well nourished, well developed, in no acute distress  HEENT: normal  Neck: no JVD, carotid bruits, or masses Cardiac: RRR; no murmurs, rubs, or gallops,no edema.  Intact distal pulses bilaterally.  Respiratory:  clear to auscultation bilaterally, normal work  of breathing GI: soft, nontender, nondistended, + BS MS: no deformity or atrophy  Skin: warm and dry, no rash Neuro:  Alert and Oriented x 3, Strength  and sensation are intact Psych: euthymic mood, full affect  Wt Readings from Last 3 Encounters:  09/15/16 182 lb 6.4 oz (82.7 kg)  06/25/16 173 lb (78.5 kg)  06/25/16 173 lb (78.5 kg)      Studies/Labs Reviewed:   EKG:  EKG is not ordered today.    Recent Labs: 04/21/2016: B Natriuretic Peptide 445.8 04/28/2016: ALT 10; BUN 48; Creatinine, Ser 2.82; Potassium 4.6; Sodium 135 07/23/2016: Hemoglobin 10.6; Platelets 195   Lipid Panel    Component Value Date/Time   CHOL 171 05/27/2010   TRIG 154 05/27/2010   HDL 38 05/27/2010   CHOLHDL 7.7 Ratio 07/16/2007 0000   VLDL 64 (H) 07/16/2007 0000   LDLCALC 102 05/27/2010    Additional studies/ records that were reviewed today include:  CPAP download    ASSESSMENT:    1. OSA (obstructive sleep apnea)   2. Essential hypertension   3. Obesity (BMI 30-39.9)      PLAN:  In order of problems listed above:  OSA - the patient is tolerating PAP therapy well without any problems. The PAP download was reviewed today and showed an AHI of 1.7/hr on 11 cm H2O with 83% compliance in using more than 4 hours nightly.  The patient has been using and benefiting from CPAP use and will continue to benefit from therapy.  HTN - BP controlled on current meds.  Obesity - I have encouraged him to get into a routine exercise program and cut back on carbs and portions.      Medication Adjustments/Labs and Tests Ordered: Current medicines are reviewed at length with the patient today.  Concerns regarding medicines are outlined above.  Medication changes, Labs and Tests ordered today are listed in the Patient Instructions below.  There are no Patient Instructions on file for this visit.   Signed, Fransico Him, MD  09/15/2016 11:14 AM    [facility] [address],  [place], [place]  [address] Phone: [phone]; Fax: [phone]

## 2016-09-15 ENCOUNTER — Ambulatory Visit (INDEPENDENT_AMBULATORY_CARE_PROVIDER_SITE_OTHER): Payer: Medicare HMO | Admitting: Cardiology

## 2016-09-15 ENCOUNTER — Encounter (INDEPENDENT_AMBULATORY_CARE_PROVIDER_SITE_OTHER): Payer: Self-pay

## 2016-09-15 ENCOUNTER — Encounter: Payer: Self-pay | Admitting: Cardiology

## 2016-09-15 VITALS — BP 100/62 | HR 77 | Ht 65.0 in | Wt 182.4 lb

## 2016-09-15 DIAGNOSIS — E669 Obesity, unspecified: Secondary | ICD-10-CM | POA: Insufficient documentation

## 2016-09-15 DIAGNOSIS — G4733 Obstructive sleep apnea (adult) (pediatric): Secondary | ICD-10-CM | POA: Diagnosis not present

## 2016-09-15 DIAGNOSIS — I10 Essential (primary) hypertension: Secondary | ICD-10-CM | POA: Diagnosis not present

## 2016-09-15 NOTE — Patient Instructions (Signed)

## 2016-09-17 ENCOUNTER — Encounter (HOSPITAL_COMMUNITY): Payer: Medicare HMO | Attending: Oncology

## 2016-09-17 ENCOUNTER — Encounter (HOSPITAL_COMMUNITY): Payer: Self-pay

## 2016-09-17 ENCOUNTER — Other Ambulatory Visit: Payer: Self-pay | Admitting: Physician Assistant

## 2016-09-17 VITALS — BP 101/59 | HR 67 | Temp 98.1°F | Resp 18

## 2016-09-17 DIAGNOSIS — N184 Chronic kidney disease, stage 4 (severe): Secondary | ICD-10-CM | POA: Diagnosis not present

## 2016-09-17 DIAGNOSIS — D649 Anemia, unspecified: Secondary | ICD-10-CM

## 2016-09-17 DIAGNOSIS — D631 Anemia in chronic kidney disease: Secondary | ICD-10-CM | POA: Insufficient documentation

## 2016-09-17 DIAGNOSIS — N189 Chronic kidney disease, unspecified: Principal | ICD-10-CM

## 2016-09-17 DIAGNOSIS — I4891 Unspecified atrial fibrillation: Secondary | ICD-10-CM

## 2016-09-17 LAB — CBC WITH DIFFERENTIAL/PLATELET
Basophils Absolute: 0 10*3/uL (ref 0.0–0.1)
Basophils Relative: 1 %
Eosinophils Absolute: 0.1 10*3/uL (ref 0.0–0.7)
Eosinophils Relative: 1 %
HCT: 31.9 % — ABNORMAL LOW (ref 36.0–46.0)
Hemoglobin: 10.7 g/dL — ABNORMAL LOW (ref 12.0–15.0)
Lymphocytes Relative: 21 %
Lymphs Abs: 1.1 10*3/uL (ref 0.7–4.0)
MCH: 32 pg (ref 26.0–34.0)
MCHC: 33.5 g/dL (ref 30.0–36.0)
MCV: 95.5 fL (ref 78.0–100.0)
Monocytes Absolute: 0.4 10*3/uL (ref 0.1–1.0)
Monocytes Relative: 7 %
Neutro Abs: 3.9 10*3/uL (ref 1.7–7.7)
Neutrophils Relative %: 70 %
Platelets: 202 10*3/uL (ref 150–400)
RBC: 3.34 MIL/uL — ABNORMAL LOW (ref 3.87–5.11)
RDW: 12.4 % (ref 11.5–15.5)
WBC: 5.5 10*3/uL (ref 4.0–10.5)

## 2016-09-17 LAB — FERRITIN: Ferritin: 215 ng/mL (ref 11–307)

## 2016-09-17 MED ORDER — DARBEPOETIN ALFA 60 MCG/0.3ML IJ SOSY
PREFILLED_SYRINGE | INTRAMUSCULAR | Status: AC
  Filled 2016-09-17: qty 0.3

## 2016-09-17 MED ORDER — DARBEPOETIN ALFA 60 MCG/0.3ML IJ SOSY
60.0000 ug | PREFILLED_SYRINGE | Freq: Once | INTRAMUSCULAR | Status: AC
  Administered 2016-09-17: 60 ug via SUBCUTANEOUS

## 2016-09-17 NOTE — Patient Instructions (Signed)
[facility] Discharge Instructions  RECOMMENDATIONS MADE BY THE CONSULTANT AND ANY TEST RESULTS WILL BE SENT TO YOUR REFERRING PHYSICIAN.  Aranesp today.    Thank you for choosing [facility] at [facility] to provide your oncology and hematology care.  To afford each patient quality time with our provider, please arrive at least 15 minutes before your scheduled appointment time.    If you have a lab appointment with the Cancer Center please come in thru the  Main Entrance and check in at the main information desk  You need to re-schedule your appointment should you arrive 10 or more minutes late.  We strive to give you quality time with our providers, and arriving late affects you and other patients whose appointments are after yours.  Also, if you no show three or more times for appointments you may be dismissed from the clinic at the providers discretion.     Again, thank you for choosing [facility].  Our hope is that these requests will decrease the amount of time that you wait before being seen by our physicians.       _____________________________________________________________  Should you have questions after your visit to  Cancer Center, please contact our office at [phone] between the hours of 8:30 a.m. and 4:30 p.m.  Voicemails left after 4:30 p.m. will not be returned until the following business day.  For prescription refill requests, have your pharmacy contact our office.       Resources For Cancer Patients and their Caregivers ? American Cancer Society: Can assist with transportation, wigs, general needs, runs Look Good Feel Better.        1-888-227-6333 ? Cancer Care: Provides financial assistance, online support groups, medication/co-pay assistance.  1-800-813-HOPE (4673) ? [facility] Assists [place] cancer patients and their families through emotional  , educational and financial support.  336-427-4357 ? [place] DSS Where to apply for food stamps, Medicaid and utility assistance. 336-342-1394 ? [facility]: Transportation to medical appointments. 336-347-2287 ? Social Security Administration: May apply for disability if have a Stage IV cancer. 336-342-7796 1-800-772-1213 ? [place] Aging, Disability and Transit Services: Assists with nutrition, care and transit needs. 336-349-2343  Cancer Center Support Programs: @10RELATIVEDAYS@ > Cancer Support Group  2nd Tuesday of the month 1pm-2pm, Journey Room  > Creative Journey  3rd Tuesday of the month 1130am-1pm, Journey Room  > Look Good Feel Better  1st Wednesday of the month 10am-12 noon, Journey Room (Call American Cancer Society to register 1-800-395-5775)    

## 2016-09-17 NOTE — Progress Notes (Signed)
[name] presents today for injection per MD orders. Aranesp 51mcg administered SQ in right Abdomen. Administration without incident. Patient tolerated well.

## 2016-09-22 ENCOUNTER — Telehealth: Payer: Self-pay | Admitting: *Deleted

## 2016-09-22 NOTE — Telephone Encounter (Signed)
Called [name] , the patient has a mask fit on 09/26/16

## 2016-09-22 NOTE — Telephone Encounter (Signed)
-----   Message from Theodoro Parma, RN sent at 09/15/2016  1:31 PM EDT ----- Regarding: dme FYI Dr. Radford Pax spoke with Apolonio Schneiders (I think) at [facility] today. She called the patient to schedule an appointment to try some new masks.  Gae Bon - Once she gets an appropriate mask, please wait 4 weeks and get a download  Thanks!

## 2016-10-15 ENCOUNTER — Encounter (HOSPITAL_COMMUNITY): Payer: Medicare HMO

## 2016-10-15 ENCOUNTER — Encounter (HOSPITAL_BASED_OUTPATIENT_CLINIC_OR_DEPARTMENT_OTHER): Payer: Medicare HMO

## 2016-10-15 ENCOUNTER — Encounter (HOSPITAL_COMMUNITY): Payer: Medicare HMO | Attending: Oncology | Admitting: Oncology

## 2016-10-15 ENCOUNTER — Encounter (HOSPITAL_COMMUNITY): Payer: Self-pay

## 2016-10-15 VITALS — BP 103/48 | HR 66 | Temp 98.3°F | Resp 18

## 2016-10-15 DIAGNOSIS — K922 Gastrointestinal hemorrhage, unspecified: Secondary | ICD-10-CM | POA: Diagnosis not present

## 2016-10-15 DIAGNOSIS — D631 Anemia in chronic kidney disease: Secondary | ICD-10-CM | POA: Diagnosis not present

## 2016-10-15 DIAGNOSIS — N189 Chronic kidney disease, unspecified: Secondary | ICD-10-CM

## 2016-10-15 DIAGNOSIS — D649 Anemia, unspecified: Secondary | ICD-10-CM

## 2016-10-15 DIAGNOSIS — N184 Chronic kidney disease, stage 4 (severe): Secondary | ICD-10-CM

## 2016-10-15 DIAGNOSIS — D500 Iron deficiency anemia secondary to blood loss (chronic): Secondary | ICD-10-CM | POA: Diagnosis not present

## 2016-10-15 LAB — CBC WITH DIFFERENTIAL/PLATELET
BASOS ABS: 0 10*3/uL (ref 0.0–0.1)
BASOS PCT: 0 %
EOS PCT: 1 %
Eosinophils Absolute: 0.1 10*3/uL (ref 0.0–0.7)
HEMATOCRIT: 31.2 % — AB (ref 36.0–46.0)
Hemoglobin: 10.4 g/dL — ABNORMAL LOW (ref 12.0–15.0)
Lymphocytes Relative: 25 %
Lymphs Abs: 1.3 10*3/uL (ref 0.7–4.0)
MCH: 32 pg (ref 26.0–34.0)
MCHC: 33.3 g/dL (ref 30.0–36.0)
MCV: 96 fL (ref 78.0–100.0)
MONO ABS: 0.2 10*3/uL (ref 0.1–1.0)
Monocytes Relative: 4 %
NEUTROS ABS: 3.5 10*3/uL (ref 1.7–7.7)
Neutrophils Relative %: 70 %
PLATELETS: 194 10*3/uL (ref 150–400)
RBC: 3.25 MIL/uL — ABNORMAL LOW (ref 3.87–5.11)
RDW: 12.5 % (ref 11.5–15.5)
WBC: 5.1 10*3/uL (ref 4.0–10.5)

## 2016-10-15 LAB — FERRITIN: Ferritin: 171 ng/mL (ref 11–307)

## 2016-10-15 MED ORDER — DARBEPOETIN ALFA 60 MCG/0.3ML IJ SOSY
60.0000 ug | PREFILLED_SYRINGE | Freq: Once | INTRAMUSCULAR | Status: AC
Start: 2016-10-15 — End: 2016-10-15
  Administered 2016-10-15: 60 ug via SUBCUTANEOUS
  Filled 2016-10-15: qty 0.3

## 2016-10-15 NOTE — Assessment & Plan Note (Addendum)
Element of anemia secondary to GI blood loss with positive stool card in the past.  Further complicated by chronic anticoagulation secondary to atrial fibrillation.  She has previously seen Dr. Carol Ada.  In the past, she has refused GI referral confirms this again today.    We will monitor iron studies moving forward as outlined above.  We will maintain a ferritin greater than 100 and normal iron saturations with IV iron replacement therapy if/when indicated.

## 2016-10-15 NOTE — Progress Notes (Signed)
Robert Bellow, MD [place] [address]  Anemia in stage 4 chronic kidney disease [facility]) - Plan: CBC with Differential, Basic metabolic panel  Anemia due to GI blood loss - Plan: CBC with Differential, Iron and TIBC, Ferritin  CURRENT THERAPY: Aranesp 60 mcg every 4 weeks  INTERVAL HISTORY: [name] 71 y.o. female returns for followup of anemia of chronic renal disease, Stage IV, on ESA therapy.  HPI Elements   Location: Blood  Quality:   Severity: Moderate anemia, 10 g/dL range, in the setting of Stage IV renal disease  Duration: Years  Context: In the setting of chronic renal disease, chronic anticoagulation due to A-fib, COPD, DM type II, and chronic GI blood loss.  Timing: Chronic  Modifying Factors:   Associated Signs & Symptoms: Fatigue, weakness   She reports an appetite at 25% and and energy level 25%.  She denies any pain.  She continues to follow with her nephrologist.  She reports that her her kidneys are stable.  She denies any complaints today.  She denies any signs or symptoms of VTE including unilateral extremity swelling/erythema/pain/feet and pleuritic chest pain or sudden onset of shortness of breath.  Review of Systems  Constitutional: Negative.  Negative for chills, fever and weight loss.  HENT: Negative.   Eyes: Negative.   Respiratory: Negative.  Negative for cough.   Cardiovascular: Negative.  Negative for chest pain.  Gastrointestinal: Negative.  Negative for blood in stool, constipation, diarrhea, melena, nausea and vomiting.  Genitourinary: Negative.   Musculoskeletal: Negative.   Skin: Negative.   Neurological: Negative.  Negative for weakness.  Endo/Heme/Allergies: Negative.   Psychiatric/Behavioral: Negative.     Past Medical History:  Diagnosis Date  . Anemia    a. mild/chronic  . Anemia due to GI blood loss 02/26/2016  . Anemia in chronic renal disease 11/30/2014  . Cardiomyopathy, nonischemic ([place])      a. 1999 nl cath;  b. 12/05 Guidant Lamoni;  c. 10/2005 ICD extraction 2/2 enterococcus bacteremia and Veg on RV lead;  c. 05/2008 low risk Myoview (scarring w/ some evidence of inf ischemia);  d. 11/2012 Echo: EF 15-20%;  e. 01/2013 s/p MDT [place] CRT D, ser # [id number];  f. 05/2013 Echo: EF 15%.  . Chronic systolic CHF (congestive heart failure) ([place])    a. 11/2012 Echo: EF 15-20%;  b. 05/2013 TEE EF 15%.  . CKD (chronic kidney disease), stage III    creatinin-1.44 in 1/09; 1.51 in 1/10  . Degenerative joint disease    of knees, shoulder, and hips  . Enterococcal infection    a. 10/2005 - AICD-explanted  . GERD (gastroesophageal reflux disease)   . Hilar density    a. infrahilar mass/adenopathy on CT scan 5/07; subsequently  resolved  . History of blood transfusion   . Hyperlipidemia   . Hypertension   . ICD (implantable cardioverter-defibrillator) infection ([place])    removed 2016  . Implantable cardioverter-defibrillator-CRT- Mdt    a.  01/2013 s/p MDT Viva XT CRT D, ser # [id number]  . LBBB (left bundle branch block)   . Obstructive sleep apnea    a. mild-did not tolerate CPAP (01/24/2013)  . PAF (paroxysmal atrial fibrillation) ([place])    a. 07/2012 s/p TEE/DCCV;  b. chronic coumadin;  c. 05/2013 Recurrent Afib->TEE/DCCV and amio initiation.  . Pneumonia 07/2005  . Pulmonary embolism ([place])    a. 07/2005 after total right hip arthroplasty  .  Tobacco abuse    a. discontinued in 1997, and then resumed  . Type II diabetes mellitus ([place])    type 2  . Urinary incontinence   . Villous adenoma of colon    a. tubovillous adenomatous polyp with focal high grade dysplasia; presented with hematochezia - followed by Dr. Laural Golden.    Past Surgical History:  Procedure Laterality Date  . A-V CARDIAC PACEMAKER INSERTION  12/05   Biventricular pacemaker/AICD  . ABDOMINAL HYSTERECTOMY  1990/92   Initial partial hysterectomy followed by BSO  . BI-VENTRICULAR IMPLANTABLE CARDIOVERTER DEFIBRILLATOR N/A  01/24/2013   Procedure: BI-VENTRICULAR IMPLANTABLE CARDIOVERTER DEFIBRILLATOR  (CRT-D);  Surgeon: Evans Lance, MD;  Location: [facility] CATH LAB;  Service: Cardiovascular;  Laterality: N/A;  . BI-VENTRICULAR IMPLANTABLE CARDIOVERTER DEFIBRILLATOR  (CRT-D)  01/24/2013  . CARDIAC CATHETERIZATION    . CARDIOVERSION N/A 08/20/2012   Procedure: TEE GUIDED CARDIOVERSION;  Surgeon: Yehuda Savannah, MD;  Location: [facility] ORS;  Service: Cardiovascular;  Laterality: N/A;  To be done @ bedside  . CARDIOVERSION N/A 06/20/2013   Procedure: CARDIOVERSION;  Surgeon: Dorothy Spark, MD;  Location: [place];  Service: Cardiovascular;  Laterality: N/A;  . CATARACT EXTRACTION W/PHACO Left 08/07/2015   Procedure: CATARACT EXTRACTION PHACO AND INTRAOCULAR LENS PLACEMENT ([place]);  Surgeon: Rutherford Guys, MD;  Location: [facility] ORS;  Service: Ophthalmology;  Laterality: Left;  CDE: 7.88  . CATARACT EXTRACTION W/PHACO Right 08/21/2015   Procedure: CATARACT EXTRACTION PHACO AND INTRAOCULAR LENS PLACEMENT (IOC);  Surgeon: Rutherford Guys, MD;  Location: [facility] ORS;  Service: Ophthalmology;  Laterality: Right;  CDE:8.55  . COLONOSCOPY Left 10/24/2014   Procedure: COLONOSCOPY;  Surgeon: Carol Ada, MD;  Location: [facility] ENDOSCOPY;  Service: Endoscopy;  Laterality: Left;  . COLONOSCOPY W/ POLYPECTOMY  2009  . COLONOSCOPY WITH ESOPHAGOGASTRODUODENOSCOPY (EGD) N/A 06/10/2013   Procedure: COLONOSCOPY WITH ESOPHAGOGASTRODUODENOSCOPY (EGD);  Surgeon: Rogene Houston, MD;  [facility];  Service: Endoscopy;  Laterality: N/A;  925  . ESOPHAGOGASTRODUODENOSCOPY N/A 10/21/2014   Procedure: ESOPHAGOGASTRODUODENOSCOPY (EGD);  Surgeon: Inda Castle, MD;  Location: [place];  Service: Endoscopy;  Laterality: N/A;  . GIVENS CAPSULE STUDY N/A 10/24/2014   Procedure: GIVENS CAPSULE STUDY;  Surgeon: Carol Ada, MD;  Location: [place];  Service: Endoscopy;  Laterality: N/A;  . ICD LEAD REMOVAL N/A 12/13/2014   Procedure: ICD LEAD  REMOVAL/EXTRACTION ;  Surgeon: Evans Lance, MD;  Location: [place];  Service: Cardiovascular;  Laterality: N/A;  [place]le back up  . KNEE ARTHROSCOPY Right 1980's?  Marland Kitchen PACEMAKER REMOVAL  11/18/05   Enterococcal infection  . RIGHT HEART CATHETERIZATION N/A 04/18/2014   Procedure: RIGHT HEART CATH;  Surgeon: Larey Dresser, MD;  Location: [facility] CATH LAB;  Service: Cardiovascular;  Laterality: N/A;  . TEE WITHOUT CARDIOVERSION N/A 08/20/2012   Procedure: TRANSESOPHAGEAL ECHOCARDIOGRAM (TEE);  Surgeon: Yehuda Savannah, MD;  Location: [facility];  Service: Cardiovascular;  Laterality: N/A;  . TEE WITHOUT CARDIOVERSION N/A 06/20/2013   Procedure: TRANSESOPHAGEAL ECHOCARDIOGRAM (TEE);  Surgeon: Dorothy Spark, MD;  Location: [place];  Service: Cardiovascular;  Laterality: N/A;  . TEE WITHOUT CARDIOVERSION N/A 12/08/2014   Procedure: TRANSESOPHAGEAL ECHOCARDIOGRAM (TEE);  Surgeon: Fay Records, MD;  [facility];  Service: Cardiovascular;  Laterality: N/A;  . TOTAL HIP ARTHROPLASTY Right 07/2005  . TUBAL LIGATION  1980's    Family History  Problem Relation Age of Onset  . Hypertension Mother   . Diabetes Mother   . Coronary artery disease Father   .  Diabetes Brother   . Hypertension Brother   . Lung cancer Brother   . Arthritis Other   . Diabetes Other   . Heart disease Other     female < 81    Social History   Social History  . Marital status: Married    Spouse name: N/A  . Number of children: N/A  . Years of education: N/A   Social History Main Topics  . Smoking status: Former Smoker    Years: 12.00    Types: Cigarettes  . Smokeless tobacco: Never Used     Comment: 05/2013: Smokes an occasional cigarette.  Says that she doesn't inhale.  . Alcohol use No  . Drug use: No  . Sexual activity: Not Currently    Birth control/ protection: None   Other Topics Concern  . Not on file   Social History Narrative   Married, lives in [place] with spouse. Retired  Secretary/administrator.      PHYSICAL EXAMINATION  ECOG PERFORMANCE STATUS: 1 - Symptomatic but completely ambulatory  There were no vitals filed for this visit.  Vitals - 1 value per visit 11/07/6158  SYSTOLIC 737  DIASTOLIC 48  Pulse 66  Temperature 98.3  Respirations 18    GENERAL:alert, no distress, well nourished, well developed, comfortable, cooperative, obese, smiling and accompanied by husband SKIN: skin color, texture, turgor are normal, no rashes or significant lesions HEAD: Normocephalic, No masses, lesions, tenderness or abnormalities EYES: normal, EOMI, Conjunctiva are pink and non-injected EARS: External ears normal OROPHARYNX:lips, buccal mucosa, and tongue normal and mucous membranes are moist  NECK: supple, trachea midline LYMPH:  no palpable lymphadenopathy BREAST:not examined LUNGS: clear to auscultation  HEART: regular rate & rhythm and no murmurs ABDOMEN:abdomen soft, obese and normal bowel sounds BACK: Back symmetric, no curvature. EXTREMITIES:less then 2 second capillary refill, no joint deformities, effusion, or inflammation, no skin discoloration, no cyanosis  NEURO: alert & oriented x 3 with fluent speech, no focal motor/sensory deficits, gait normal with assistance of rolling walker.   LABORATORY DATA: CBC    Component Value Date/Time   WBC 5.1 10/15/2016 1254   RBC 3.25 (L) 10/15/2016 1254   HGB 10.4 (L) 10/15/2016 1254   HCT 31.2 (L) 10/15/2016 1254   PLT 194 10/15/2016 1254   MCV 96.0 10/15/2016 1254   MCH 32.0 10/15/2016 1254   MCHC 33.3 10/15/2016 1254   RDW 12.5 10/15/2016 1254   LYMPHSABS 1.3 10/15/2016 1254   MONOABS 0.2 10/15/2016 1254   EOSABS 0.1 10/15/2016 1254   BASOSABS 0.0 10/15/2016 1254      Chemistry      Component Value Date/Time   NA 135 04/28/2016 1250   K 4.6 04/28/2016 1250   CL 103 04/28/2016 1250   CO2 25 04/28/2016 1250   BUN 48 (H) 04/28/2016 1250   CREATININE 2.82 (H) 04/28/2016 1250   CREATININE 1.17 (H)  01/20/2013 1010      Component Value Date/Time   CALCIUM 9.6 04/28/2016 1250   ALKPHOS 66 04/28/2016 1250   AST 14 (L) 04/28/2016 1250   ALT 10 (L) 04/28/2016 1250   BILITOT 0.9 04/28/2016 1250     Lab Results  Component Value Date   IRON 86 10/18/2014   TIBC 279 10/18/2014   FERRITIN 215 09/17/2016     PENDING LABS:   RADIOGRAPHIC STUDIES:  No results found.   PATHOLOGY:    ASSESSMENT AND PLAN:  Anemia in chronic renal disease Anemia of chronic renal disease, stage IV, on  ESA therapy consisting of Aranesp 60 mcg which has been maintaining her hemoglobin in the 10 g/dL range.  Labs today: CBC diff, ferritin.  I personally reviewed and went over laboratory results with the patient.  The results are noted within this dictation.  Blood pressure today is 103/48.  No concerns regarding hypertension related to ESA therapy at this time.  Hemoglobin is 10.4 g/dL.  She satisfy treatment parameters for Aranesp today.  She denies any signs or symptoms of VTE which is a risk of ESA therapy.   Supportive therapy plan is reviewed.  Labs every 4 weeks: CBC diff Labs in 4 mo: BMET, iron/TIBC, Ferritin.    Continue Aranesp 60 mcg every 4 weeks.  Problem list reviewed with patient and edited accordingly.  Medications are reviewed with the patient and edited accordingly.  Return in 4 months for follow-up.  More than 50% of the time spent with the patient was utilized for counseling and coordination of care.  Anemia due to GI blood loss Element of anemia secondary to GI blood loss with positive stool card in the past.  Further complicated by chronic anticoagulation secondary to atrial fibrillation.  She has previously seen Dr. Carol Ada.  In the past, she has refused GI referral confirms this again today.    We will monitor iron studies moving forward as outlined above.  We will maintain a ferritin greater than 100 and normal iron saturations with IV iron replacement  therapy if/when indicated.   ORDERS PLACED FOR THIS ENCOUNTER: Orders Placed This Encounter  Procedures  . CBC with Differential  . Basic metabolic panel  . Iron and TIBC  . Ferritin    MEDICATIONS PRESCRIBED THIS ENCOUNTER: No orders of the defined types were placed in this encounter.   THERAPY PLAN:  Continue with ESA support and ongoing surveillance of iron studies.  Will need to keep her ferritin above 100.  All questions were answered. The patient knows to call the clinic with any problems, questions or concerns. We can certainly see the patient much sooner if necessary.  Patient and plan discussed with Dr. Twana First and she is in agreement with the aforementioned.   This note is electronically signed by: Doy Mince 10/15/2016 2:36 PM

## 2016-10-15 NOTE — Progress Notes (Signed)
[name] presents today for injection per the provider's orders.  Aranesp administration without incident; see MAR for injection details.  Patient tolerated procedure well and without incident.  No questions or complaints noted at this time.

## 2016-10-15 NOTE — Patient Instructions (Signed)
[name] at [facility] Discharge Instructions  RECOMMENDATIONS MADE BY THE CONSULTANT AND ANY TEST RESULTS WILL BE SENT TO YOUR REFERRING PHYSICIAN.  Aranesp injection today for hemoglobin of 10.4 Return as scheduled for lab work and injections.   Thank you for choosing [place] at [facility] to provide your oncology and hematology care.  To afford each patient quality time with our provider, please arrive at least 15 minutes before your scheduled appointment time.    If you have a lab appointment with the [facility] please come in thru the  Main Entrance and check in at the main information desk  You need to re-schedule your appointment should you arrive 10 or more minutes late.  We strive to give you quality time with our providers, and arriving late affects you and other patients whose appointments are after yours.  Also, if you no show three or more times for appointments you may be dismissed from the clinic at the providers discretion.     Again, thank you for choosing [facility].  Our hope is that these requests will decrease the amount of time that you wait before being seen by our physicians.       _____________________________________________________________  Should you have questions after your visit to [facility], please contact our office at [phone] between the hours of 8:30 a.m. and 4:30 p.m.  Voicemails left after 4:30 p.m. will not be returned until the following business day.  For prescription refill requests, have your pharmacy contact our office.       Resources For Cancer Patients and their Caregivers ? American Cancer Society: Can assist with transportation, wigs, general needs, runs Look Good Feel Better.        573-030-4448 ? Cancer Care: Provides financial assistance, online support groups, medication/co-pay assistance.  1-800-813-HOPE 919-550-5523) ? [place] Assists [place] cancer patients and their families through emotional , educational and financial support.  361-809-1322 ? [place] DSS Where to apply for food stamps, Medicaid and utility assistance. (402)777-9917 ? RCATS: Transportation to medical appointments. (567)809-6966 ? Social Security Administration: May apply for disability if have a Stage IV cancer. 503 826 0838 (707)541-6956 ? [employer], Disability and Transit Services: Assists with nutrition, care and transit needs. [place] Support Programs: @10RELATIVEDAYS @ > Cancer Support Group  2nd Tuesday of the month 1pm-2pm, Journey Room  > Creative Journey  3rd Tuesday of the month 1130am-1pm, Journey Room  > Look Good Feel Better  1st Wednesday of the month 10am-12 noon, Journey Room (Call [place] to register [phone])

## 2016-10-15 NOTE — Patient Instructions (Addendum)
[place] at [facility] Discharge Instructions  RECOMMENDATIONS MADE BY THE CONSULTANT AND ANY TEST RESULTS WILL BE SENT TO YOUR REFERRING PHYSICIAN.  You were seen today by Kirby Crigler PA-C. Continue Aranesp injection and labs every 4 weeks. Return in about 4 weeks for follow up and treatment.    Thank you for choosing [place] at [facility] to provide your oncology and hematology care.  To afford each patient quality time with our provider, please arrive at least 15 minutes before your scheduled appointment time.    If you have a lab appointment with the [place] please come in thru the  Main Entrance and check in at the main information desk  You need to re-schedule your appointment should you arrive 10 or more minutes late.  We strive to give you quality time with our providers, and arriving late affects you and other patients whose appointments are after yours.  Also, if you no show three or more times for appointments you may be dismissed from the clinic at the providers discretion.     Again, thank you for choosing [facility].  Our hope is that these requests will decrease the amount of time that you wait before being seen by our physicians.       _____________________________________________________________  Should you have questions after your visit to [facility], please contact our office at [phone] between the hours of 8:30 a.m. and 4:30 p.m.  Voicemails left after 4:30 p.m. will not be returned until the following business day.  For prescription refill requests, have your pharmacy contact our office.       Resources For Cancer Patients and their Caregivers ? American Cancer Society: Can assist with transportation, wigs, general needs, runs Look Good Feel Better.        219-034-1940 ? Cancer Care: Provides financial assistance, online support groups, medication/co-pay assistance.   1-800-813-HOPE 3013992296) ? [facility] Assists [place] cancer patients and their families through emotional , educational and financial support.  7866005347 ? [place] DSS Where to apply for food stamps, Medicaid and utility assistance. 2764663879 ? RCATS: Transportation to medical appointments. 561-553-4465 ? Social Security Administration: May apply for disability if have a Stage IV cancer. (660)425-2807 (323) 240-5467 ? [employer], Disability and Transit Services: Assists with nutrition, care and transit needs. [place] Support Programs: @10RELATIVEDAYS @ > Cancer Support Group  2nd Tuesday of the month 1pm-2pm, Journey Room  > Creative Journey  3rd Tuesday of the month 1130am-1pm, Journey Room  > Look Good Feel Better  1st Wednesday of the month 10am-12 noon, Journey Room (Call [place] to register [phone])

## 2016-10-15 NOTE — Assessment & Plan Note (Signed)
Anemia of chronic renal disease, stage IV, on ESA therapy consisting of Aranesp 60 mcg which has been maintaining her hemoglobin in the 10 g/dL range.  Labs today: CBC diff, ferritin.  I personally reviewed and went over laboratory results with the patient.  The results are noted within this dictation.  Blood pressure today is 103/48.  No concerns regarding hypertension related to ESA therapy at this time.  Hemoglobin is 10.4 g/dL.  She satisfy treatment parameters for Aranesp today.  She denies any signs or symptoms of VTE which is a risk of ESA therapy.   Supportive therapy plan is reviewed.  Labs every 4 weeks: CBC diff Labs in 4 mo: BMET, iron/TIBC, Ferritin.    Continue Aranesp 60 mcg every 4 weeks.  Problem list reviewed with patient and edited accordingly.  Medications are reviewed with the patient and edited accordingly.  Return in 4 months for follow-up.  More than 50% of the time spent with the patient was utilized for counseling and coordination of care.

## 2016-10-26 ENCOUNTER — Encounter: Payer: Self-pay | Admitting: Cardiology

## 2016-11-05 ENCOUNTER — Telehealth: Payer: Self-pay | Admitting: *Deleted

## 2016-11-05 NOTE — Telephone Encounter (Signed)
Message  Received: 5 days ago  Message Contents  [name], [name], MD  Freada Bergeron, CMA        Good AHI and compliance. Continue current CPAP settings.     CPAP  Order: [id number]  Status:  Final result Visible to patient:  No (Not Released)  Notes recorded by Freada Bergeron, CMA on 11/05/2016 at 10:30 AM EDT Informed patient of results and patient understanding was verbalized. Patient states this machine works well for her and she thanked for the call. ------  Notes recorded by Freada Bergeron, CMA on 11/04/2016 at 11:04 AM EDT LMTCB ------  Notes recorded by Sueanne Margarita, MD on 10/31/2016 at 1:14 PM EDT Good AHI and compliance. Continue current CPAP settings.

## 2016-11-05 NOTE — Telephone Encounter (Signed)
-----   Message from Sueanne Margarita, MD sent at 10/31/2016  1:14 PM EDT ----- Good AHI and compliance.  Continue current CPAP settings.

## 2016-11-12 ENCOUNTER — Encounter (HOSPITAL_COMMUNITY): Payer: Medicare HMO

## 2016-11-12 ENCOUNTER — Encounter (HOSPITAL_COMMUNITY): Payer: Medicare HMO | Attending: Oncology

## 2016-11-12 ENCOUNTER — Encounter (HOSPITAL_COMMUNITY): Payer: Self-pay

## 2016-11-12 VITALS — BP 103/48 | HR 71 | Temp 98.1°F | Resp 18

## 2016-11-12 DIAGNOSIS — D500 Iron deficiency anemia secondary to blood loss (chronic): Secondary | ICD-10-CM

## 2016-11-12 DIAGNOSIS — D631 Anemia in chronic kidney disease: Secondary | ICD-10-CM | POA: Diagnosis not present

## 2016-11-12 DIAGNOSIS — N189 Chronic kidney disease, unspecified: Secondary | ICD-10-CM

## 2016-11-12 DIAGNOSIS — N184 Chronic kidney disease, stage 4 (severe): Principal | ICD-10-CM

## 2016-11-12 LAB — CBC WITH DIFFERENTIAL/PLATELET
BASOS ABS: 0 10*3/uL (ref 0.0–0.1)
Basophils Relative: 0 %
EOS ABS: 0.1 10*3/uL (ref 0.0–0.7)
Eosinophils Relative: 1 %
HEMATOCRIT: 30.9 % — AB (ref 36.0–46.0)
HEMOGLOBIN: 10.2 g/dL — AB (ref 12.0–15.0)
Lymphocytes Relative: 27 %
Lymphs Abs: 1.5 10*3/uL (ref 0.7–4.0)
MCH: 31.9 pg (ref 26.0–34.0)
MCHC: 33 g/dL (ref 30.0–36.0)
MCV: 96.6 fL (ref 78.0–100.0)
MONO ABS: 0.4 10*3/uL (ref 0.1–1.0)
Monocytes Relative: 7 %
NEUTROS ABS: 3.6 10*3/uL (ref 1.7–7.7)
NEUTROS PCT: 65 %
PLATELETS: 187 10*3/uL (ref 150–400)
RBC: 3.2 MIL/uL — ABNORMAL LOW (ref 3.87–5.11)
RDW: 12.5 % (ref 11.5–15.5)
WBC: 5.6 10*3/uL (ref 4.0–10.5)

## 2016-11-12 MED ORDER — DARBEPOETIN ALFA 60 MCG/0.3ML IJ SOSY
PREFILLED_SYRINGE | INTRAMUSCULAR | Status: AC
  Filled 2016-11-12: qty 0.3

## 2016-11-12 MED ORDER — DARBEPOETIN ALFA 60 MCG/0.3ML IJ SOSY
60.0000 ug | PREFILLED_SYRINGE | Freq: Once | INTRAMUSCULAR | Status: AC
  Administered 2016-11-12: 60 ug via SUBCUTANEOUS

## 2016-11-12 NOTE — Progress Notes (Signed)
[name] presents today for injection per MD orders. Aranesp 48mcg administered SQ in right Abdomen. Administration without incident. Patient tolerated well.   Vitals stable and discharged home from clinic via walker with husband. Follow up as scheduled.

## 2016-11-12 NOTE — Patient Instructions (Signed)
[facility] at [facility] Discharge Instructions  RECOMMENDATIONS MADE BY THE CONSULTANT AND ANY TEST RESULTS WILL BE SENT TO YOUR REFERRING PHYSICIAN.  Aranesp given Follow up as scheduled  Thank you for choosing [facility] at [facility] to provide your oncology and hematology care.  To afford each patient quality time with our provider, please arrive at least 15 minutes before your scheduled appointment time.    If you have a lab appointment with the Cancer Center please come in thru the  Main Entrance and check in at the main information desk  You need to re-schedule your appointment should you arrive 10 or more minutes late.  We strive to give you quality time with our providers, and arriving late affects you and other patients whose appointments are after yours.  Also, if you no show three or more times for appointments you may be dismissed from the clinic at the providers discretion.     Again, thank you for choosing [facility].  Our hope is that these requests will decrease the amount of time that you wait before being seen by our physicians.       _____________________________________________________________  Should you have questions after your visit to [facility], please contact our office at [phone] between the hours of 8:30 a.m. and 4:30 p.m.  Voicemails left after 4:30 p.m. will not be returned until the following business day.  For prescription refill requests, have your pharmacy contact our office.       Resources For Cancer Patients and their Caregivers ? American Cancer Society: Can assist with transportation, wigs, general needs, runs Look Good Feel Better.        1-888-227-6333 ? Cancer Care: Provides financial assistance, online support groups, medication/co-pay assistance.  1-800-813-HOPE (4673) ? [facility] Assists [place] cancer patients and their  families through emotional , educational and financial support.  336-427-4357 ? [place] DSS Where to apply for food stamps, Medicaid and utility assistance. 336-342-1394 ? RCATS: Transportation to medical appointments. 336-347-2287 ? Social Security Administration: May apply for disability if have a Stage IV cancer. 336-342-7796 1-800-772-1213 ? [place] Aging, Disability and Transit Services: Assists with nutrition, care and transit needs. 336-349-2343  Cancer Center Support Programs: @10RELATIVEDAYS@ > Cancer Support Group  2nd Tuesday of the month 1pm-2pm, Journey Room  > Creative Journey  3rd Tuesday of the month 1130am-1pm, Journey Room  > Look Good Feel Better  1st Wednesday of the month 10am-12 noon, Journey Room (Call American [employer] to register 1-800-395-5775)   

## 2016-12-10 ENCOUNTER — Encounter (HOSPITAL_BASED_OUTPATIENT_CLINIC_OR_DEPARTMENT_OTHER): Payer: Medicare HMO

## 2016-12-10 ENCOUNTER — Encounter (HOSPITAL_COMMUNITY): Payer: Medicare HMO | Attending: Oncology

## 2016-12-10 ENCOUNTER — Encounter (HOSPITAL_COMMUNITY): Payer: Self-pay

## 2016-12-10 VITALS — BP 104/50 | HR 73 | Temp 98.3°F | Resp 18

## 2016-12-10 DIAGNOSIS — N189 Chronic kidney disease, unspecified: Secondary | ICD-10-CM

## 2016-12-10 DIAGNOSIS — N184 Chronic kidney disease, stage 4 (severe): Secondary | ICD-10-CM

## 2016-12-10 DIAGNOSIS — D631 Anemia in chronic kidney disease: Secondary | ICD-10-CM

## 2016-12-10 DIAGNOSIS — D500 Iron deficiency anemia secondary to blood loss (chronic): Secondary | ICD-10-CM | POA: Insufficient documentation

## 2016-12-10 LAB — CBC WITH DIFFERENTIAL/PLATELET
BASOS PCT: 0 %
Basophils Absolute: 0 10*3/uL (ref 0.0–0.1)
EOS ABS: 0.1 10*3/uL (ref 0.0–0.7)
Eosinophils Relative: 1 %
HEMATOCRIT: 32.3 % — AB (ref 36.0–46.0)
HEMOGLOBIN: 10.6 g/dL — AB (ref 12.0–15.0)
LYMPHS ABS: 1.4 10*3/uL (ref 0.7–4.0)
Lymphocytes Relative: 27 %
MCH: 31.6 pg (ref 26.0–34.0)
MCHC: 32.8 g/dL (ref 30.0–36.0)
MCV: 96.4 fL (ref 78.0–100.0)
MONOS PCT: 7 %
Monocytes Absolute: 0.4 10*3/uL (ref 0.1–1.0)
NEUTROS PCT: 65 %
Neutro Abs: 3.3 10*3/uL (ref 1.7–7.7)
Platelets: 168 10*3/uL (ref 150–400)
RBC: 3.35 MIL/uL — AB (ref 3.87–5.11)
RDW: 12.3 % (ref 11.5–15.5)
WBC: 5.1 10*3/uL (ref 4.0–10.5)

## 2016-12-10 MED ORDER — DARBEPOETIN ALFA 60 MCG/0.3ML IJ SOSY
60.0000 ug | PREFILLED_SYRINGE | Freq: Once | INTRAMUSCULAR | Status: AC
  Administered 2016-12-10: 60 ug via SUBCUTANEOUS
  Filled 2016-12-10: qty 0.3

## 2016-12-10 NOTE — Patient Instructions (Signed)
[facility] at [facility] Discharge Instructions  RECOMMENDATIONS MADE BY THE CONSULTANT AND ANY TEST RESULTS WILL BE SENT TO YOUR REFERRING PHYSICIAN.  Aranesp given Follow up as scheduled  Thank you for choosing [facility] at [facility] to provide your oncology and hematology care.  To afford each patient quality time with our provider, please arrive at least 15 minutes before your scheduled appointment time.    If you have a lab appointment with the Cancer Center please come in thru the  Main Entrance and check in at the main information desk  You need to re-schedule your appointment should you arrive 10 or more minutes late.  We strive to give you quality time with our providers, and arriving late affects you and other patients whose appointments are after yours.  Also, if you no show three or more times for appointments you may be dismissed from the clinic at the providers discretion.     Again, thank you for choosing [facility].  Our hope is that these requests will decrease the amount of time that you wait before being seen by our physicians.       _____________________________________________________________  Should you have questions after your visit to  Cancer Center, please contact our office at [phone] between the hours of 8:30 a.m. and 4:30 p.m.  Voicemails left after 4:30 p.m. will not be returned until the following business day.  For prescription refill requests, have your pharmacy contact our office.       Resources For Cancer Patients and their Caregivers ? American Cancer Society: Can assist with transportation, wigs, general needs, runs Look Good Feel Better.        1-888-227-6333 ? Cancer Care: Provides financial assistance, online support groups, medication/co-pay assistance.  1-800-813-HOPE (4673) ? [facility] Assists [place] cancer patients and their  families through emotional , educational and financial support.  336-427-4357 ? [place] DSS Where to apply for food stamps, Medicaid and utility assistance. 336-342-1394 ? [facility]: Transportation to medical appointments. 336-347-2287 ? Social Security Administration: May apply for disability if have a Stage IV cancer. 336-342-7796 1-800-772-1213 ? [place] Aging, Disability and Transit Services: Assists with nutrition, care and transit needs. 336-349-2343  Cancer Center Support Programs: @10RELATIVEDAYS@ > Cancer Support Group  2nd Tuesday of the month 1pm-2pm, Journey Room  > Creative Journey  3rd Tuesday of the month 1130am-1pm, Journey Room  > Look Good Feel Better  1st Wednesday of the month 10am-12 noon, Journey Room (Call American Cancer Society to register 1-800-395-5775)   

## 2016-12-10 NOTE — Progress Notes (Signed)
[name] presents today for injection per MD orders. Aranesp 44mcg administered SQ in right Abdomen. Administration without incident. Patient tolerated well. Vitals stable and discharged home from clinic ambulatory.follow up as scheduled.

## 2017-01-07 ENCOUNTER — Other Ambulatory Visit (HOSPITAL_COMMUNITY): Payer: Medicare HMO

## 2017-01-07 ENCOUNTER — Ambulatory Visit (HOSPITAL_COMMUNITY): Payer: Medicare HMO

## 2017-01-09 ENCOUNTER — Encounter (HOSPITAL_COMMUNITY): Payer: Medicare HMO | Attending: Oncology

## 2017-01-09 ENCOUNTER — Encounter (HOSPITAL_COMMUNITY): Payer: Medicare HMO

## 2017-01-09 DIAGNOSIS — N189 Chronic kidney disease, unspecified: Secondary | ICD-10-CM

## 2017-01-09 DIAGNOSIS — N184 Chronic kidney disease, stage 4 (severe): Principal | ICD-10-CM

## 2017-01-09 DIAGNOSIS — D500 Iron deficiency anemia secondary to blood loss (chronic): Secondary | ICD-10-CM

## 2017-01-09 DIAGNOSIS — D631 Anemia in chronic kidney disease: Secondary | ICD-10-CM

## 2017-01-09 LAB — CBC WITH DIFFERENTIAL/PLATELET
BASOS ABS: 0 10*3/uL (ref 0.0–0.1)
Basophils Relative: 0 %
EOS ABS: 0.1 10*3/uL (ref 0.0–0.7)
Eosinophils Relative: 2 %
HCT: 30.7 % — ABNORMAL LOW (ref 36.0–46.0)
Hemoglobin: 10.2 g/dL — ABNORMAL LOW (ref 12.0–15.0)
LYMPHS ABS: 1.6 10*3/uL (ref 0.7–4.0)
LYMPHS PCT: 25 %
MCH: 31.7 pg (ref 26.0–34.0)
MCHC: 33.2 g/dL (ref 30.0–36.0)
MCV: 95.3 fL (ref 78.0–100.0)
MONO ABS: 0.4 10*3/uL (ref 0.1–1.0)
Monocytes Relative: 6 %
Neutro Abs: 4.3 10*3/uL (ref 1.7–7.7)
Neutrophils Relative %: 67 %
PLATELETS: 191 10*3/uL (ref 150–400)
RBC: 3.22 MIL/uL — ABNORMAL LOW (ref 3.87–5.11)
RDW: 12.6 % (ref 11.5–15.5)
WBC: 6.4 10*3/uL (ref 4.0–10.5)

## 2017-01-09 MED ORDER — DARBEPOETIN ALFA 60 MCG/0.3ML IJ SOSY
60.0000 ug | PREFILLED_SYRINGE | Freq: Once | INTRAMUSCULAR | Status: AC
  Administered 2017-01-09: 60 ug via SUBCUTANEOUS

## 2017-01-09 MED ORDER — DARBEPOETIN ALFA 60 MCG/0.3ML IJ SOSY
PREFILLED_SYRINGE | INTRAMUSCULAR | Status: AC
  Filled 2017-01-09: qty 0.3

## 2017-01-09 NOTE — Progress Notes (Signed)
[name] presents today for injection per MD orders. Aranesp 58mcg administered SQ in right Abdomen. Administration without incident. Patient tolerated well.

## 2017-01-15 ENCOUNTER — Other Ambulatory Visit: Payer: Self-pay

## 2017-01-15 DIAGNOSIS — N185 Chronic kidney disease, stage 5: Secondary | ICD-10-CM

## 2017-02-04 ENCOUNTER — Ambulatory Visit (HOSPITAL_COMMUNITY): Payer: Medicare HMO

## 2017-02-04 ENCOUNTER — Other Ambulatory Visit (HOSPITAL_COMMUNITY): Payer: Medicare HMO

## 2017-02-06 ENCOUNTER — Encounter (HOSPITAL_COMMUNITY): Payer: Self-pay

## 2017-02-06 ENCOUNTER — Other Ambulatory Visit (HOSPITAL_COMMUNITY): Payer: Self-pay

## 2017-02-06 ENCOUNTER — Ambulatory Visit (HOSPITAL_COMMUNITY): Payer: Medicare HMO

## 2017-02-06 ENCOUNTER — Encounter (HOSPITAL_BASED_OUTPATIENT_CLINIC_OR_DEPARTMENT_OTHER): Payer: Medicare HMO

## 2017-02-06 ENCOUNTER — Encounter (HOSPITAL_COMMUNITY): Payer: Medicare HMO | Attending: Oncology

## 2017-02-06 VITALS — BP 92/57 | HR 74 | Resp 18

## 2017-02-06 DIAGNOSIS — N189 Chronic kidney disease, unspecified: Secondary | ICD-10-CM | POA: Diagnosis not present

## 2017-02-06 DIAGNOSIS — D500 Iron deficiency anemia secondary to blood loss (chronic): Secondary | ICD-10-CM | POA: Diagnosis not present

## 2017-02-06 DIAGNOSIS — D631 Anemia in chronic kidney disease: Secondary | ICD-10-CM

## 2017-02-06 DIAGNOSIS — N184 Chronic kidney disease, stage 4 (severe): Secondary | ICD-10-CM | POA: Insufficient documentation

## 2017-02-06 LAB — CBC WITH DIFFERENTIAL/PLATELET
BASOS PCT: 0 %
Basophils Absolute: 0 10*3/uL (ref 0.0–0.1)
Eosinophils Absolute: 0.2 10*3/uL (ref 0.0–0.7)
Eosinophils Relative: 4 %
HEMATOCRIT: 32 % — AB (ref 36.0–46.0)
HEMOGLOBIN: 10.8 g/dL — AB (ref 12.0–15.0)
Lymphocytes Relative: 25 %
Lymphs Abs: 1.4 10*3/uL (ref 0.7–4.0)
MCH: 32.1 pg (ref 26.0–34.0)
MCHC: 33.8 g/dL (ref 30.0–36.0)
MCV: 95.2 fL (ref 78.0–100.0)
MONO ABS: 0.5 10*3/uL (ref 0.1–1.0)
MONOS PCT: 9 %
NEUTROS PCT: 62 %
Neutro Abs: 3.4 10*3/uL (ref 1.7–7.7)
Platelets: 202 10*3/uL (ref 150–400)
RBC: 3.36 MIL/uL — ABNORMAL LOW (ref 3.87–5.11)
RDW: 12.7 % (ref 11.5–15.5)
WBC: 5.5 10*3/uL (ref 4.0–10.5)

## 2017-02-06 LAB — BASIC METABOLIC PANEL
ANION GAP: 10 (ref 5–15)
BUN: 51 mg/dL — AB (ref 6–20)
CHLORIDE: 102 mmol/L (ref 101–111)
CO2: 24 mmol/L (ref 22–32)
Calcium: 9.9 mg/dL (ref 8.9–10.3)
Creatinine, Ser: 2.95 mg/dL — ABNORMAL HIGH (ref 0.44–1.00)
GFR calc Af Amer: 17 mL/min — ABNORMAL LOW (ref >60)
GFR, EST NON AFRICAN AMERICAN: 15 mL/min — AB (ref >60)
GLUCOSE: 197 mg/dL — AB (ref 65–99)
POTASSIUM: 4.6 mmol/L (ref 3.5–5.1)
Sodium: 136 mmol/L (ref 135–145)

## 2017-02-06 LAB — IRON AND TIBC
Iron: 84 ug/dL (ref 28–170)
SATURATION RATIOS: 34 % — AB (ref 10.4–31.8)
TIBC: 244 ug/dL — ABNORMAL LOW (ref 250–450)
UIBC: 160 ug/dL

## 2017-02-06 LAB — FERRITIN: FERRITIN: 230 ng/mL (ref 11–307)

## 2017-02-06 MED ORDER — DARBEPOETIN ALFA 60 MCG/0.3ML IJ SOSY
60.0000 ug | PREFILLED_SYRINGE | Freq: Once | INTRAMUSCULAR | Status: AC
  Administered 2017-02-06: 60 ug via SUBCUTANEOUS

## 2017-02-06 MED ORDER — DARBEPOETIN ALFA 60 MCG/0.3ML IJ SOSY
PREFILLED_SYRINGE | INTRAMUSCULAR | Status: AC
  Filled 2017-02-06: qty 0.3

## 2017-02-06 NOTE — Progress Notes (Signed)
[name] presents today for injection per MD orders. Aranesp 79mcg administered SQ in left Abdomen. Administration without incident. Patient tolerated well. Vitals stable and discharged home from clinic via walker. Follow up as scheduled.

## 2017-02-06 NOTE — Patient Instructions (Signed)
[facility] at [facility] Discharge Instructions  RECOMMENDATIONS MADE BY THE CONSULTANT AND ANY TEST RESULTS WILL BE SENT TO YOUR REFERRING PHYSICIAN.  Aranesp given today  Follow up as scheduled.  Thank you for choosing [facility] at [facility] to provide your oncology and hematology care.  To afford each patient quality time with our provider, please arrive at least 15 minutes before your scheduled appointment time.    If you have a lab appointment with the Cancer Center please come in thru the  Main Entrance and check in at the main information desk  You need to re-schedule your appointment should you arrive 10 or more minutes late.  We strive to give you quality time with our providers, and arriving late affects you and other patients whose appointments are after yours.  Also, if you no show three or more times for appointments you may be dismissed from the clinic at the providers discretion.     Again, thank you for choosing [facility].  Our hope is that these requests will decrease the amount of time that you wait before being seen by our physicians.       _____________________________________________________________  Should you have questions after your visit to [facility], please contact our office at [phone] between the hours of 8:30 a.m. and 4:30 p.m.  Voicemails left after 4:30 p.m. will not be returned until the following business day.  For prescription refill requests, have your pharmacy contact our office.       Resources For Cancer Patients and their Caregivers ? American Cancer Society: Can assist with transportation, wigs, general needs, runs Look Good Feel Better.        1-888-227-6333 ? Cancer Care: Provides financial assistance, online support groups, medication/co-pay assistance.  1-800-813-HOPE (4673) ? [facility] Assists [place] cancer patients and  their families through emotional , educational and financial support.  336-427-4357 ? [place] DSS Where to apply for food stamps, Medicaid and utility assistance. 336-342-1394 ? [facility]: Transportation to medical appointments. 336-347-2287 ? Social Security Administration: May apply for disability if have a Stage IV cancer. 336-342-7796 1-800-772-1213 ? [place] Aging, Disability and Transit Services: Assists with nutrition, care and transit needs. 336-349-2343  Cancer Center Support Programs: @10RELATIVEDAYS@ > Cancer Support Group  2nd Tuesday of the month 1pm-2pm, Journey Room  > Creative Journey  3rd Tuesday of the month 1130am-1pm, Journey Room  > Look Good Feel Better  1st Wednesday of the month 10am-12 noon, Journey Room (Call American Cancer Society to register 1-800-395-5775)   

## 2017-02-18 ENCOUNTER — Encounter: Payer: Self-pay | Admitting: Surgery

## 2017-02-25 ENCOUNTER — Ambulatory Visit (HOSPITAL_COMMUNITY)
Admission: RE | Admit: 2017-02-25 | Discharge: 2017-02-25 | Disposition: A | Discharge status: Home / Self Care | Payer: Medicare HMO | Source: Ambulatory Visit | Attending: Surgery | Admitting: Surgery

## 2017-02-25 ENCOUNTER — Encounter: Payer: Self-pay | Admitting: Surgery

## 2017-02-25 ENCOUNTER — Ambulatory Visit (INDEPENDENT_AMBULATORY_CARE_PROVIDER_SITE_OTHER): Payer: Medicare HMO | Admitting: Surgery

## 2017-02-25 ENCOUNTER — Ambulatory Visit (INDEPENDENT_AMBULATORY_CARE_PROVIDER_SITE_OTHER)
Admission: RE | Admit: 2017-02-25 | Discharge: 2017-02-25 | Disposition: A | Discharge status: Home / Self Care | Payer: Medicare HMO | Source: Ambulatory Visit | Attending: Surgery | Admitting: Surgery

## 2017-02-25 VITALS — BP 111/67 | HR 69 | Temp 98.6°F | Resp 18 | Ht 65.0 in | Wt 190.4 lb

## 2017-02-25 DIAGNOSIS — N184 Chronic kidney disease, stage 4 (severe): Secondary | ICD-10-CM | POA: Diagnosis not present

## 2017-02-25 DIAGNOSIS — Z01818 Encounter for other preprocedural examination: Secondary | ICD-10-CM | POA: Diagnosis not present

## 2017-02-25 DIAGNOSIS — N185 Chronic kidney disease, stage 5: Secondary | ICD-10-CM

## 2017-02-25 NOTE — Progress Notes (Signed)
[facility] [place]  Patient name: [name] MRN: [id number] DOB: 1945/09/15 Sex: female   REQUESTING PROVIDER:    Dr. Joelyn Oms   REASON FOR CONSULT:    CKD  HISTORY OF PRESENT ILLNESS:   [name] is a 71 y.o. female, who is Referred today for dialysis access.  Her renal dysfunction secondary to hypertension and diabetes.  She also suffers from nonischemic cardiomyopathy.  She has had pacemakers placed in the past but these have been removed secondary to infection.  She also has a history of pulmonary embolism after right hip arthroplasty.  She is on Coumadin for atrial fibrillation.  She is right-handed but uses her left hand frequently.  PAST MEDICAL HISTORY    Past Medical History:  Diagnosis Date  . Anemia    a. mild/chronic  . Anemia due to GI blood loss 02/26/2016  . Anemia in chronic renal disease 11/30/2014  . Cardiomyopathy, nonischemic ([place])    a. 1999 nl cath;  b. 12/05 Guidant Pen Mar;  c. 10/2005 ICD extraction 2/2 enterococcus bacteremia and Veg on RV lead;  c. 05/2008 low risk Myoview (scarring w/ some evidence of inf ischemia);  d. 11/2012 Echo: EF 15-20%;  e. 01/2013 s/p MDT [place] CRT D, ser # [id number];  f. 05/2013 Echo: EF 15%.  . Chronic systolic CHF (congestive heart failure) ([place])    a. 11/2012 Echo: EF 15-20%;  b. 05/2013 TEE EF 15%.  . CKD (chronic kidney disease), stage III    creatinin-1.44 in 1/09; 1.51 in 1/10  . Degenerative joint disease    of knees, shoulder, and hips  . Enterococcal infection    a. 10/2005 - AICD-explanted  . GERD (gastroesophageal reflux disease)   . Hilar density    a. infrahilar mass/adenopathy on CT scan 5/07; subsequently  resolved  . History of blood transfusion   . Hyperlipidemia   . Hypertension   . ICD (implantable cardioverter-defibrillator) infection ([place])    removed 2016  . Implantable cardioverter-defibrillator-CRT- Mdt    a.  01/2013 s/p MDT Viva XT  CRT D, ser # [id number]  . LBBB (left bundle branch block)   . Obstructive sleep apnea    a. mild-did not tolerate CPAP (01/24/2013)  . PAF (paroxysmal atrial fibrillation) ([place])    a. 07/2012 s/p TEE/DCCV;  b. chronic coumadin;  c. 05/2013 Recurrent Afib->TEE/DCCV and amio initiation.  . Pneumonia 07/2005  . Pulmonary embolism ([place])    a. 07/2005 after total right hip arthroplasty  . Tobacco abuse    a. discontinued in 1997, and then resumed  . Type II diabetes mellitus ([place])    type 2  . Urinary incontinence   . Villous adenoma of colon    a. tubovillous adenomatous polyp with focal high grade dysplasia; presented with hematochezia - followed by Dr. Laural Golden.     FAMILY HISTORY   Family History  Problem Relation Age of Onset  . Hypertension Mother   . Diabetes Mother   . Coronary artery disease Father   . Diabetes Brother   . Hypertension Brother   . Lung cancer Brother   . Arthritis Other   . Diabetes Other   . Heart disease Other        female < 73    SOCIAL HISTORY:   Social History   Social History  . Marital status: Married    Spouse name: N/A  . Number of children: N/A  . Years of education: N/A  Occupational History  . Not on file.   Social History Main Topics  . Smoking status: Former Smoker    Years: 12.00    Types: Cigarettes  . Smokeless tobacco: Never Used     Comment: 05/2013: Smokes an occasional cigarette.  Says that she doesn't inhale.  . Alcohol use No  . Drug use: No  . Sexual activity: Not Currently    Birth control/ protection: None   Other Topics Concern  . Not on file   Social History Narrative   Married, lives in [place] with spouse. Retired Secretary/administrator.     ALLERGIES:    No Known Allergies  CURRENT MEDICATIONS:    Current Outpatient Prescriptions  Medication Sig Dispense Refill  . ACCU-CHEK AVIVA PLUS test strip     . amiodarone (PACERONE) 100 MG tablet Take 1 tablet (100 mg total) by mouth daily. 90 tablet 3  .  calcitRIOL (ROCALTROL) 0.25 MCG capsule Take 0.25 mcg by mouth daily.    . carvedilol (COREG) 6.25 MG tablet TAKE 1 TABLET BY MOUTH TWICE DAILY WITH A MEAL. 60 tablet 0  . colchicine 0.6 MG tablet Take 0.6 mg by mouth 2 (two) times daily as needed (gout). Reported on 11/06/2015    . feeding supplement, ENSURE ENLIVE, (ENSURE ENLIVE) LIQD Take 237 mLs by mouth 2 (two) times daily between meals. 237 mL 12  . ferrous sulfate 324 (65 FE) MG TBEC Take 1 tablet (325 mg total) by mouth 2 (two) times daily. 60 tablet   . furosemide (LASIX) 40 MG tablet TAKE 1 TABLET BY MOUTH TWICE DAILY. 60 tablet 3  . glimepiride (AMARYL) 2 MG tablet Take 2 mg by mouth daily.    . isosorbide mononitrate (IMDUR) 30 MG 24 hr tablet Take 1 tablet (30 mg total) by mouth daily. 30 tablet 0  . levothyroxine (SYNTHROID, LEVOTHROID) 25 MCG tablet Take 1 tablet (25 mcg total) by mouth daily before breakfast. 30 tablet 0  . lisinopril (PRINIVIL,ZESTRIL) 10 MG tablet TAKE ONE TABLET BY MOUTH DAILY. 30 tablet 0  . metoCLOPramide (REGLAN) 5 MG tablet Take 5 mg by mouth 2 (two) times daily.     . ondansetron (ZOFRAN) 4 MG tablet Take 4 mg by mouth every 4 (four) hours as needed for nausea or vomiting. Reported on 11/06/2015    . oxyCODONE-acetaminophen (PERCOCET) 10-325 MG per tablet Take 1 tablet by mouth every 6 (six) hours as needed for pain.     . polyethylene glycol (MIRALAX / GLYCOLAX) packet Take 17 g by mouth daily as needed for mild constipation.    . pravastatin (PRAVACHOL) 40 MG tablet Take 80 mg by mouth at bedtime.     Marland Kitchen spironolactone (ALDACTONE) 25 MG tablet TAKE 1/2 TABLET BY MOUTH DAILY. 45 tablet 2  . warfarin (COUMADIN) 2.5 MG tablet Take 0.5-1 tablets (1.25-2.5 mg total) by mouth daily at 6 PM. Takes 1 tablet on Mon, Wed, Fri, take 0.5 tablet on all other days     No current facility-administered medications for this visit.     REVIEW OF SYSTEMS:   [X]  denotes positive finding, [ ]  denotes negative  finding Cardiac  Comments:  Chest pain or chest pressure:    Shortness of breath upon exertion:    Short of breath when lying flat:    Irregular heart rhythm:        Vascular    Pain in calf, thigh, or hip brought on by ambulation:    Pain in feet at night that  wakes you up from your sleep:     Blood clot in your veins:    Leg swelling:         Pulmonary    Oxygen at home:    Productive cough:     Wheezing:         Neurologic    Sudden weakness in arms or legs:     Sudden numbness in arms or legs:     Sudden onset of difficulty speaking or slurred speech:    Temporary loss of vision in one eye:     Problems with dizziness:         Gastrointestinal    Blood in stool:      Vomited blood:         Genitourinary    Burning when urinating:     Blood in urine:        Psychiatric    Major depression:         Hematologic    Bleeding problems:    Problems with blood clotting too easily:        Skin    Rashes or ulcers:        Constitutional    Fever or chills:     PHYSICAL EXAM:   Vitals:   02/25/17 1558  BP: 111/67  Pulse: 69  Resp: 18  Temp: 98.6 F (37 C)  TempSrc: Oral  SpO2: 91%  Weight: 190 lb 6.4 oz (86.4 kg)  Height: 5\' 5"  (1.651 m)    GENERAL: The patient is a well-nourished female, in no acute distress. The vital signs are documented above. CARDIAC: There is a regular rate and rhythm.  VASCULAR: Palpable right radial pulse PULMONARY: Nonlabored respirations MUSCULOSKELETAL: There are no major deformities or cyanosis. NEUROLOGIC: No focal weakness or paresthesias are detected. SKIN: There are no ulcers or rashes noted. PSYCHIATRIC: The patient has a normal affect.  STUDIES:   I have reviewed her vein mapping studies.  It appears that the best vein visible was a right basilic vein with measurements greater than 0.5 cm throughout.  Arterial duplex shows triphasic waveforms  ASSESSMENT and PLAN   Chronic renal insufficiency: I discussed with  the patient and her husband that based on vein mapping, her best option would be a right basilic vein fistula.  I discussed doing this in 2 stages.  I discussed the risk of non-maturity as well as the risk of steal syndrome.  She will need to be off her Coumadin prior to her operation.  I will discuss this with Dr. Karie Kirks to determine whether or not she needs a Lovenox bridge.  I'm scheduling her procedure for Thursday, September 13.     Annamarie Major, MD [facility] [phone] Pager [phone]

## 2017-02-26 ENCOUNTER — Other Ambulatory Visit: Payer: Self-pay

## 2017-03-03 ENCOUNTER — Telehealth: Payer: Self-pay

## 2017-03-03 NOTE — Telephone Encounter (Signed)
Medication Discontinuation Clearance Form sent to Dr. Karie Kirks on 02/25/17 via fax.  Also notified Dr. Vickey Sages office that form was being faxed on 02/25/17.   Received completed Medication Discontinuation Clearance Form today. (03/03/17)  Dr. Karie Kirks completed the form as needing bridging with Lovenox; hand written instructions stated "Please take care of the Lovenox.  Please excuse this delay-we just received this note."  Phone call to Dr. Trula Slade.  Advised of the recommendation written per Dr. Karie Kirks.  Also informed Dr. Trula Slade that the pt. Was instructed to take last dose of Coumadin on 9/7, and then to hold 5 days prior to her surgery.  Dr. Trula Slade reviewed pt's history and last office note.  Advised that she will not need to take the Lovenox at this time.         Called pt. and checked that she had stopped her Coumadin on 9/8.  The pt. stated she had stopped it as instructed.  Advised that Dr. Trula Slade did not feel she would need a Lovenox bridge at this time.  Reminded of arrival time of 7:00 AM on 03/05/17 to Admitting at [facility]. Verb. Understanding.

## 2017-03-04 ENCOUNTER — Encounter: Payer: Self-pay | Admitting: Surgery

## 2017-03-04 ENCOUNTER — Other Ambulatory Visit (HOSPITAL_COMMUNITY)
Admission: RE | Admit: 2017-03-04 | Discharge: 2017-03-04 | Disposition: A | Discharge status: Home / Self Care | Payer: Medicare HMO | Source: Ambulatory Visit | Attending: Family Medicine | Admitting: Family Medicine

## 2017-03-04 ENCOUNTER — Other Ambulatory Visit (HOSPITAL_COMMUNITY): Payer: Self-pay | Admitting: *Deleted

## 2017-03-04 ENCOUNTER — Encounter (HOSPITAL_COMMUNITY): Payer: Self-pay | Admitting: *Deleted

## 2017-03-04 DIAGNOSIS — D631 Anemia in chronic kidney disease: Secondary | ICD-10-CM

## 2017-03-04 DIAGNOSIS — Z7901 Long term (current) use of anticoagulants: Secondary | ICD-10-CM | POA: Diagnosis present

## 2017-03-04 DIAGNOSIS — N184 Chronic kidney disease, stage 4 (severe): Principal | ICD-10-CM

## 2017-03-04 LAB — PROTIME-INR
INR: 1.42
Prothrombin Time: 17.2 seconds — ABNORMAL HIGH (ref 11.4–15.2)

## 2017-03-04 NOTE — Progress Notes (Signed)
Spoke with pt's husband, [name] for pre-op call. Pt was in the room with him during call. Pt has a cardiac history of A-fib and cardiomyopathy. Dr. Aundra Dubin is her cardiologist. She has had 2 ICD's, but both have been removed due to infection. Last dose of Coumadin was 02/27/17 PM dose. Pt is a type 2 diabetic. Pt does not check her blood sugar on a regular basis. [name] does not know what or when her last A1C was. Instructed [name] to have pt check her blood sugar in the AM when she gets up and every 2 hours until she leaves for the hospital. If blood sugar is 70 or below, treat with 1/2 cup of clear juice (apple or cranberry) and recheck blood sugar 15 minutes after drinking juice. If blood sugar continues to be 70 or below, call the Short Stay department and ask to speak to a nurse. He voiced understanding.

## 2017-03-04 NOTE — Progress Notes (Signed)
Anesthesia Chart Review:  Pt is a same day work up.   Pt is a 71 year old female scheduled for R 1st stage basilic vein transposition on 03/05/2017 with Harold Barban, MD  - PCP is Leslie Andrea, MD - cardiologist is Loralie Champagne, MD; last office visit 04/21/16.   PMH includes:  Nonischemic cardiomyopathy (dx 1999; 2 prior ICDs, both explanted for bacteremia with vegetation on TEE; last ICD explanted 2016; no plans for re-insertion ICD), chronic systolic HF, LBBB, PAF, HTN, DM, hyperlipidemia, CKD (stage IV), OSA, anemia, PE (2007), GERD. Former smoker.   Medications include: amiodarone, carvedilol, iron, lasix, glimepiride, imdur, levothyroxine, lisinopril, pravastatin, spironolactone, coumadin. Last dose coumadin 02/27/17.   Labs will be obtained DOS.   EKG will be obtained DOS.   Echo 03/06/16:  - Left ventricle: The cavity size was moderately dilated. Wall thickness was normal. The estimated ejection fraction was 20%. Diffuse hypokinesis. Doppler parameters are consistent with abnormal left ventricular relaxation (grade 1 diastolic dysfunction). - Aortic valve: Trileaflet; moderately calcified leaflets. There was no stenosis. There was trivial regurgitation. Mean gradient (S): 9 mm Hg. - Mitral valve: Mildly to moderately calcified annulus. There was mild to moderate regurgitation. - Left atrium: The atrium was moderately to severely dilated. - Right ventricle: The cavity size was normal. Systolic function was normal. - Tricuspid valve: There was mild regurgitation. Peak RV-RA gradient (S): 35 mm Hg. - Pulmonary arteries: PA peak pressure: 38 mm Hg (S). - Inferior vena cava: The vessel was normal in size. The respirophasic diameter changes were in the normal range (>= 50%), consistent with normal central venous pressure. - Impressions:  Moderately dilated LV with EF 20%, diffuse hypokinesis. Mild to moderate mitral regurgitation. Normal RV size and systolic function. Severe left atrial  enlargement. Mild pulmonary hypertension.  Pt will need further assessment DOS by assigned anesthesiologist.  If no acute CV sx, and labs and EKG acceptable DOS, I anticipate pt can proceed as scheduled.   Willeen Cass, FNP-BC [facility] ([facility]) Short Stay Surgical Center/Anesthesiology Phone: [phone] 03/04/2017 4:41 PM

## 2017-03-05 ENCOUNTER — Ambulatory Visit (HOSPITAL_COMMUNITY): Payer: Medicare HMO | Admitting: Emergency Medicine

## 2017-03-05 ENCOUNTER — Ambulatory Visit (HOSPITAL_COMMUNITY)
Admission: RE | Admit: 2017-03-05 | Discharge: 2017-03-05 | Disposition: A | Discharge status: Home / Self Care | Payer: Medicare HMO | Source: Ambulatory Visit | Attending: Surgery | Admitting: Surgery

## 2017-03-05 ENCOUNTER — Other Ambulatory Visit: Payer: Self-pay

## 2017-03-05 ENCOUNTER — Encounter (HOSPITAL_COMMUNITY): Payer: Self-pay | Admitting: *Deleted

## 2017-03-05 ENCOUNTER — Encounter (HOSPITAL_COMMUNITY)
Admission: RE | Disposition: A | Discharge status: Home / Self Care | Payer: Self-pay | Source: Ambulatory Visit | Attending: Surgery

## 2017-03-05 DIAGNOSIS — I5022 Chronic systolic (congestive) heart failure: Secondary | ICD-10-CM | POA: Diagnosis not present

## 2017-03-05 DIAGNOSIS — Z87891 Personal history of nicotine dependence: Secondary | ICD-10-CM | POA: Diagnosis not present

## 2017-03-05 DIAGNOSIS — E785 Hyperlipidemia, unspecified: Secondary | ICD-10-CM | POA: Diagnosis not present

## 2017-03-05 DIAGNOSIS — Z7984 Long term (current) use of oral hypoglycemic drugs: Secondary | ICD-10-CM | POA: Insufficient documentation

## 2017-03-05 DIAGNOSIS — I428 Other cardiomyopathies: Secondary | ICD-10-CM | POA: Insufficient documentation

## 2017-03-05 DIAGNOSIS — Z86711 Personal history of pulmonary embolism: Secondary | ICD-10-CM | POA: Insufficient documentation

## 2017-03-05 DIAGNOSIS — N185 Chronic kidney disease, stage 5: Secondary | ICD-10-CM | POA: Insufficient documentation

## 2017-03-05 DIAGNOSIS — I447 Left bundle-branch block, unspecified: Secondary | ICD-10-CM | POA: Diagnosis not present

## 2017-03-05 DIAGNOSIS — Z7901 Long term (current) use of anticoagulants: Secondary | ICD-10-CM | POA: Insufficient documentation

## 2017-03-05 DIAGNOSIS — I132 Hypertensive heart and chronic kidney disease with heart failure and with stage 5 chronic kidney disease, or end stage renal disease: Secondary | ICD-10-CM | POA: Insufficient documentation

## 2017-03-05 DIAGNOSIS — E1122 Type 2 diabetes mellitus with diabetic chronic kidney disease: Secondary | ICD-10-CM | POA: Insufficient documentation

## 2017-03-05 DIAGNOSIS — K219 Gastro-esophageal reflux disease without esophagitis: Secondary | ICD-10-CM | POA: Diagnosis not present

## 2017-03-05 DIAGNOSIS — Z950 Presence of cardiac pacemaker: Secondary | ICD-10-CM | POA: Insufficient documentation

## 2017-03-05 DIAGNOSIS — D631 Anemia in chronic kidney disease: Secondary | ICD-10-CM | POA: Insufficient documentation

## 2017-03-05 DIAGNOSIS — G4733 Obstructive sleep apnea (adult) (pediatric): Secondary | ICD-10-CM | POA: Diagnosis not present

## 2017-03-05 HISTORY — PX: BASCILIC VEIN TRANSPOSITION: SHX5742

## 2017-03-05 HISTORY — DX: Hypothyroidism, unspecified: E03.9

## 2017-03-05 HISTORY — DX: Peripheral vascular disease, unspecified: I73.9

## 2017-03-05 LAB — GLUCOSE, CAPILLARY
GLUCOSE-CAPILLARY: 170 mg/dL — AB (ref 65–99)
GLUCOSE-CAPILLARY: 178 mg/dL — AB (ref 65–99)

## 2017-03-05 LAB — POCT I-STAT 4, (NA,K, GLUC, HGB,HCT)
Glucose, Bld: 176 mg/dL — ABNORMAL HIGH (ref 65–99)
HCT: 26 % — ABNORMAL LOW (ref 36.0–46.0)
HEMOGLOBIN: 8.8 g/dL — AB (ref 12.0–15.0)
Potassium: 4 mmol/L (ref 3.5–5.1)
SODIUM: 140 mmol/L (ref 135–145)

## 2017-03-05 LAB — SURGICAL PCR SCREEN
MRSA, PCR: NEGATIVE
STAPHYLOCOCCUS AUREUS: NEGATIVE

## 2017-03-05 LAB — PROTIME-INR
INR: 1.29
Prothrombin Time: 16 seconds — ABNORMAL HIGH (ref 11.4–15.2)

## 2017-03-05 SURGERY — TRANSPOSITION, VEIN, BASILIC
Anesthesia: Monitor Anesthesia Care | Site: Arm Upper | Laterality: Right

## 2017-03-05 MED ORDER — SODIUM CHLORIDE 0.9 % IV SOLN
INTRAVENOUS | Status: DC
  Administered 2017-03-05: 08:00:00 via INTRAVENOUS

## 2017-03-05 MED ORDER — FENTANYL CITRATE (PF) 100 MCG/2ML IJ SOLN: 25.0000 ug | INTRAMUSCULAR | Status: DC | PRN

## 2017-03-05 MED ORDER — PHENYLEPHRINE 40 MCG/ML (10ML) SYRINGE FOR IV PUSH (FOR BLOOD PRESSURE SUPPORT)
PREFILLED_SYRINGE | INTRAVENOUS | Status: DC | PRN
  Administered 2017-03-05 (×2): 80 ug via INTRAVENOUS
  Administered 2017-03-05 (×2): 40 ug via INTRAVENOUS

## 2017-03-05 MED ORDER — MUPIROCIN 2 % EX OINT
1.0000 "application " | TOPICAL_OINTMENT | Freq: Once | CUTANEOUS | Status: AC
  Administered 2017-03-05: 1 via TOPICAL

## 2017-03-05 MED ORDER — ONDANSETRON HCL 4 MG/2ML IJ SOLN: 4.0000 mg | Freq: Once | INTRAMUSCULAR | Status: DC | PRN

## 2017-03-05 MED ORDER — KETAMINE HCL 10 MG/ML IJ SOLN
INTRAMUSCULAR | Status: DC | PRN
  Administered 2017-03-05 (×2): 10 mg via INTRAVENOUS

## 2017-03-05 MED ORDER — MUPIROCIN 2 % EX OINT
TOPICAL_OINTMENT | CUTANEOUS | Status: AC
  Filled 2017-03-05: qty 22

## 2017-03-05 MED ORDER — PROPOFOL 10 MG/ML IV BOLUS
INTRAVENOUS | Status: DC | PRN
  Administered 2017-03-05: 20 mg via INTRAVENOUS

## 2017-03-05 MED ORDER — EPHEDRINE SULFATE-NACL 50-0.9 MG/10ML-% IV SOSY
PREFILLED_SYRINGE | INTRAVENOUS | Status: DC | PRN
  Administered 2017-03-05 (×3): 5 mg via INTRAVENOUS
  Administered 2017-03-05: 10 mg via INTRAVENOUS

## 2017-03-05 MED ORDER — DEXTROSE 5 % IV SOLN
1.5000 g | INTRAVENOUS | Status: AC
  Administered 2017-03-05: 1.5 g via INTRAVENOUS

## 2017-03-05 MED ORDER — KETAMINE HCL-SODIUM CHLORIDE 100-0.9 MG/10ML-% IV SOSY
PREFILLED_SYRINGE | INTRAVENOUS | Status: AC
  Filled 2017-03-05: qty 10

## 2017-03-05 MED ORDER — DEXAMETHASONE SODIUM PHOSPHATE 10 MG/ML IJ SOLN
INTRAMUSCULAR | Status: AC
  Filled 2017-03-05: qty 1

## 2017-03-05 MED ORDER — EPHEDRINE 5 MG/ML INJ
INTRAVENOUS | Status: AC
  Filled 2017-03-05: qty 10

## 2017-03-05 MED ORDER — GLYCOPYRROLATE 0.2 MG/ML IJ SOLN
INTRAMUSCULAR | Status: DC | PRN
  Administered 2017-03-05: 0.1 mg via INTRAVENOUS

## 2017-03-05 MED ORDER — LIDOCAINE-EPINEPHRINE (PF) 1 %-1:200000 IJ SOLN
INTRAMUSCULAR | Status: DC | PRN
  Administered 2017-03-05: 30 mL

## 2017-03-05 MED ORDER — HEPARIN SODIUM (PORCINE) 1000 UNIT/ML IJ SOLN
INTRAMUSCULAR | Status: DC | PRN
  Administered 2017-03-05: 3000 [IU] via INTRAVENOUS

## 2017-03-05 MED ORDER — DEXAMETHASONE SODIUM PHOSPHATE 10 MG/ML IJ SOLN
INTRAMUSCULAR | Status: DC | PRN
  Administered 2017-03-05: 4 mg via INTRAVENOUS

## 2017-03-05 MED ORDER — ONDANSETRON HCL 4 MG/2ML IJ SOLN
INTRAMUSCULAR | Status: AC
  Filled 2017-03-05: qty 2

## 2017-03-05 MED ORDER — MIDAZOLAM HCL 2 MG/2ML IJ SOLN
INTRAMUSCULAR | Status: AC
  Filled 2017-03-05: qty 2

## 2017-03-05 MED ORDER — HEMOSTATIC AGENTS (NO CHARGE) OPTIME
TOPICAL | Status: DC | PRN
  Administered 2017-03-05: 1 via TOPICAL

## 2017-03-05 MED ORDER — HEPARIN SODIUM (PORCINE) 1000 UNIT/ML IJ SOLN
INTRAMUSCULAR | Status: AC
  Filled 2017-03-05: qty 1

## 2017-03-05 MED ORDER — PHENYLEPHRINE 40 MCG/ML (10ML) SYRINGE FOR IV PUSH (FOR BLOOD PRESSURE SUPPORT)
PREFILLED_SYRINGE | INTRAVENOUS | Status: AC
  Filled 2017-03-05: qty 10

## 2017-03-05 MED ORDER — FENTANYL CITRATE (PF) 250 MCG/5ML IJ SOLN
INTRAMUSCULAR | Status: DC | PRN
  Administered 2017-03-05: 25 ug via INTRAVENOUS
  Administered 2017-03-05: 50 ug via INTRAVENOUS

## 2017-03-05 MED ORDER — 0.9 % SODIUM CHLORIDE (POUR BTL) OPTIME
TOPICAL | Status: DC | PRN
Start: 2017-03-05 — End: 2017-03-05
  Administered 2017-03-05: 1000 mL

## 2017-03-05 MED ORDER — MIDAZOLAM HCL 2 MG/2ML IJ SOLN
INTRAMUSCULAR | Status: DC | PRN
  Administered 2017-03-05: 2 mg via INTRAVENOUS

## 2017-03-05 MED ORDER — LIDOCAINE 2% (20 MG/ML) 5 ML SYRINGE
INTRAMUSCULAR | Status: AC
Start: 2017-03-05 — End: 2017-03-05
  Filled 2017-03-05: qty 5

## 2017-03-05 MED ORDER — FENTANYL CITRATE (PF) 250 MCG/5ML IJ SOLN
INTRAMUSCULAR | Status: AC
  Filled 2017-03-05: qty 5

## 2017-03-05 MED ORDER — CHLORHEXIDINE GLUCONATE CLOTH 2 % EX PADS: 6.0000 | MEDICATED_PAD | Freq: Once | CUTANEOUS | Status: DC

## 2017-03-05 MED ORDER — PROTAMINE SULFATE 10 MG/ML IV SOLN
INTRAVENOUS | Status: AC
  Filled 2017-03-05: qty 25

## 2017-03-05 MED ORDER — PROTAMINE SULFATE 10 MG/ML IV SOLN
INTRAVENOUS | Status: DC | PRN
  Administered 2017-03-05: 25 mg via INTRAVENOUS

## 2017-03-05 MED ORDER — LIDOCAINE HCL (PF) 1 % IJ SOLN
INTRAMUSCULAR | Status: AC
  Filled 2017-03-05: qty 30

## 2017-03-05 MED ORDER — LIDOCAINE-EPINEPHRINE (PF) 1 %-1:200000 IJ SOLN
INTRAMUSCULAR | Status: AC
  Filled 2017-03-05: qty 30

## 2017-03-05 MED ORDER — DEXTROSE 5 % IV SOLN
INTRAVENOUS | Status: AC
  Filled 2017-03-05: qty 1.5

## 2017-03-05 MED ORDER — ONDANSETRON HCL 4 MG/2ML IJ SOLN
INTRAMUSCULAR | Status: DC | PRN
  Administered 2017-03-05: 4 mg via INTRAVENOUS

## 2017-03-05 MED ORDER — PROPOFOL 1000 MG/100ML IV EMUL
INTRAVENOUS | Status: AC
Start: 2017-03-05 — End: 2017-03-05
  Filled 2017-03-05: qty 100

## 2017-03-05 MED ORDER — SODIUM CHLORIDE 0.9 % IV SOLN
INTRAVENOUS | Status: DC | PRN
  Administered 2017-03-05: 10:00:00

## 2017-03-05 MED ORDER — LIDOCAINE HCL (CARDIAC) 20 MG/ML IV SOLN
INTRAVENOUS | Status: DC | PRN
  Administered 2017-03-05: 60 mg via INTRAVENOUS

## 2017-03-05 MED ORDER — PROPOFOL 500 MG/50ML IV EMUL
INTRAVENOUS | Status: DC | PRN
  Administered 2017-03-05: 50 ug/kg/min via INTRAVENOUS

## 2017-03-05 SURGICAL SUPPLY — 35 items
ARMBAND PINK RESTRICT EXTREMIT (MISCELLANEOUS) ×3 IMPLANT
CANISTER SUCT 3000ML PPV (MISCELLANEOUS) ×3 IMPLANT
CLIP VESOCCLUDE MED 24/CT (CLIP) IMPLANT
CLIP VESOCCLUDE MED 6/CT (CLIP) ×3 IMPLANT
CLIP VESOCCLUDE SM WIDE 24/CT (CLIP) IMPLANT
CLIP VESOCCLUDE SM WIDE 6/CT (CLIP) ×3 IMPLANT
COVER PROBE W GEL 5X96 (DRAPES) ×3 IMPLANT
DERMABOND ADVANCED (GAUZE/BANDAGES/DRESSINGS) ×2
DERMABOND ADVANCED .7 DNX12 (GAUZE/BANDAGES/DRESSINGS) ×1 IMPLANT
ELECT REM PT RETURN 9FT ADLT (ELECTROSURGICAL) ×3
ELECTRODE REM PT RTRN 9FT ADLT (ELECTROSURGICAL) ×1 IMPLANT
GLOVE BIOGEL PI IND STRL 6 (GLOVE) ×1 IMPLANT
GLOVE BIOGEL PI IND STRL 6.5 (GLOVE) ×2 IMPLANT
GLOVE BIOGEL PI IND STRL 7.5 (GLOVE) ×1 IMPLANT
GLOVE BIOGEL PI INDICATOR 6 (GLOVE) ×2
GLOVE BIOGEL PI INDICATOR 6.5 (GLOVE) ×4
GLOVE BIOGEL PI INDICATOR 7.5 (GLOVE) ×2
GLOVE SURG SS PI 7.5 STRL IVOR (GLOVE) ×3 IMPLANT
GOWN STRL REUS W/ TWL LRG LVL3 (GOWN DISPOSABLE) ×2 IMPLANT
GOWN STRL REUS W/ TWL XL LVL3 (GOWN DISPOSABLE) ×1 IMPLANT
GOWN STRL REUS W/TWL LRG LVL3 (GOWN DISPOSABLE) ×4
GOWN STRL REUS W/TWL XL LVL3 (GOWN DISPOSABLE) ×2
HEMOSTAT SNOW SURGICEL 2X4 (HEMOSTASIS) ×3 IMPLANT
KIT BASIN OR (CUSTOM PROCEDURE TRAY) ×3 IMPLANT
KIT ROOM TURNOVER OR (KITS) ×3 IMPLANT
NS IRRIG 1000ML POUR BTL (IV SOLUTION) ×3 IMPLANT
PACK CV ACCESS (CUSTOM PROCEDURE TRAY) ×3 IMPLANT
PAD ARMBOARD 7.5X6 YLW CONV (MISCELLANEOUS) ×6 IMPLANT
SUT PROLENE 6 0 CC (SUTURE) ×3 IMPLANT
SUT SILK 2 0 SH (SUTURE) IMPLANT
SUT VIC AB 3-0 SH 27 (SUTURE) ×2
SUT VIC AB 3-0 SH 27X BRD (SUTURE) ×1 IMPLANT
SUT VICRYL 4-0 PS2 18IN ABS (SUTURE) ×3 IMPLANT
UNDERPAD 30X30 (UNDERPADS AND DIAPERS) ×3 IMPLANT
WATER STERILE IRR 1000ML POUR (IV SOLUTION) ×3 IMPLANT

## 2017-03-05 NOTE — Anesthesia Postprocedure Evaluation (Signed)
Anesthesia Post Note  Patient: [name]  Procedure(s) Performed: Procedure(s) (LRB): RIGHT 1ST STAGE BASCILIC VEIN TRANSPOSITION (Right)     Patient location during evaluation: PACU Anesthesia Type: MAC Level of consciousness: awake and alert Pain management: pain level controlled Vital Signs Assessment: post-procedure vital signs reviewed and stable Respiratory status: spontaneous breathing, nonlabored ventilation, respiratory function stable and patient connected to nasal cannula oxygen Cardiovascular status: stable and blood pressure returned to baseline Postop Assessment: no apparent nausea or vomiting Anesthetic complications: no    Last Vitals:  Vitals:   03/05/17 1103 03/05/17 1106  BP:  (!) 130/55  Pulse: 69 68  Resp: 18 20  Temp: (!) 36.3 C   SpO2: 97% 97%    Last Pain:  Vitals:   03/05/17 1103  TempSrc:   PainSc: 0-No pain                 Ryan P Ellender

## 2017-03-05 NOTE — OR Nursing (Signed)
Verified all information for Intra-op chart with Erling Conte, RN at [phone]

## 2017-03-05 NOTE — Transfer of Care (Signed)
Immediate Anesthesia Transfer of Care Note  Patient: [name]  Procedure(s) Performed: Procedure(s): RIGHT 1ST STAGE [place] (Right)  Patient Location: PACU  Anesthesia Type:MAC  Level of Consciousness: awake, alert  and oriented  Airway & Oxygen Therapy: Patient Spontanous Breathing and Patient connected to nasal cannula oxygen  Post-op Assessment: Report given to RN and Post -op Vital signs reviewed and stable  Post vital signs: Reviewed and stable  Last Vitals:  Vitals:   03/05/17 0802 03/05/17 0812  BP: (!) 91/48 (!) 120/44  Pulse: 63   Resp: 19   Temp: 36.7 C   SpO2: 97%     Last Pain:  Vitals:   03/05/17 0802  TempSrc: Oral      Patients Stated Pain Goal: 2 (46/65/99 3570)  Complications: No apparent anesthesia complications

## 2017-03-05 NOTE — Anesthesia Procedure Notes (Signed)
Procedure Name: MAC Date/Time: 03/05/2017 9:42 AM Performed by: Teressa Lower Pre-anesthesia Checklist: Patient identified, Emergency Drugs available, Suction available, Patient being monitored and Timeout performed Oxygen Delivery Method: Simple face mask Ventilation: Oral airway inserted - appropriate to patient size

## 2017-03-05 NOTE — H&P (View-Only) (Signed)
[facility] [place]  Patient name: [name] MRN: [id number] DOB: [birth year] Sex: female   REQUESTING PROVIDER:    Dr. Joelyn Oms   REASON FOR CONSULT:    CKD  HISTORY OF PRESENT ILLNESS:   [name] is a 71 y.o. female, who is Referred today for dialysis access.  Her renal dysfunction secondary to hypertension and diabetes.  She also suffers from nonischemic cardiomyopathy.  She has had pacemakers placed in the past but these have been removed secondary to infection.  She also has a history of pulmonary embolism after right hip arthroplasty.  She is on Coumadin for atrial fibrillation.  She is right-handed but uses her left hand frequently.  PAST MEDICAL HISTORY    Past Medical History:  Diagnosis Date  . Anemia    a. mild/chronic  . Anemia due to GI blood loss 02/26/2016  . Anemia in chronic renal disease 11/30/2014  . Cardiomyopathy, nonischemic ([place])    a. 1999 nl cath;  b. 12/05 Guidant [place];  c. 10/2005 ICD extraction 2/2 enterococcus bacteremia and Veg on RV lead;  c. 05/2008 low risk Myoview (scarring w/ some evidence of inf ischemia);  d. 11/2012 Echo: EF 15-20%;  e. 01/2013 s/p MDT [place] CRT D, ser # [id number];  f. 05/2013 Echo: EF 15%.  . Chronic systolic CHF (congestive heart failure) ([place])    a. 11/2012 Echo: EF 15-20%;  b. 05/2013 TEE EF 15%.  . CKD (chronic kidney disease), stage III    creatinin-1.44 in 1/09; 1.51 in 1/10  . Degenerative joint disease    of knees, shoulder, and hips  . Enterococcal infection    a. 10/2005 - AICD-explanted  . GERD (gastroesophageal reflux disease)   . Hilar density    a. infrahilar mass/adenopathy on CT scan 5/07; subsequently  resolved  . History of blood transfusion   . Hyperlipidemia   . Hypertension   . ICD (implantable cardioverter-defibrillator) infection ([place])    removed 2016  . Implantable cardioverter-defibrillator-CRT- Mdt    a.  01/2013 s/p MDT Viva XT  CRT D, ser # [id number]  . LBBB (left bundle branch block)   . Obstructive sleep apnea    a. mild-did not tolerate CPAP (01/24/2013)  . PAF (paroxysmal atrial fibrillation) ([place])    a. 07/2012 s/p TEE/DCCV;  b. chronic coumadin;  c. 05/2013 Recurrent Afib->TEE/DCCV and amio initiation.  . Pneumonia 07/2005  . Pulmonary embolism ([place])    a. 07/2005 after total right hip arthroplasty  . Tobacco abuse    a. discontinued in 1997, and then resumed  . Type II diabetes mellitus ([place])    type 2  . Urinary incontinence   . Villous adenoma of colon    a. tubovillous adenomatous polyp with focal high grade dysplasia; presented with hematochezia - followed by Dr. Laural Golden.     FAMILY HISTORY   Family History  Problem Relation Age of Onset  . Hypertension Mother   . Diabetes Mother   . Coronary artery disease Father   . Diabetes Brother   . Hypertension Brother   . Lung cancer Brother   . Arthritis Other   . Diabetes Other   . Heart disease Other        female < 63    SOCIAL HISTORY:   Social History   Social History  . Marital status: Married    Spouse name: N/A  . Number of children: N/A  . Years of education: N/A  Occupational History  . Not on file.   Social History Main Topics  . Smoking status: Former Smoker    Years: 12.00    Types: Cigarettes  . Smokeless tobacco: Never Used     Comment: 05/2013: Smokes an occasional cigarette.  Says that she doesn't inhale.  . Alcohol use No  . Drug use: No  . Sexual activity: Not Currently    Birth control/ protection: None   Other Topics Concern  . Not on file   Social History Narrative   Married, lives in [place] with spouse. Retired Secretary/administrator.     ALLERGIES:    No Known Allergies  CURRENT MEDICATIONS:    Current Outpatient Prescriptions  Medication Sig Dispense Refill  . ACCU-CHEK AVIVA PLUS test strip     . amiodarone (PACERONE) 100 MG tablet Take 1 tablet (100 mg total) by mouth daily. 90 tablet 3  .  calcitRIOL (ROCALTROL) 0.25 MCG capsule Take 0.25 mcg by mouth daily.    . carvedilol (COREG) 6.25 MG tablet TAKE 1 TABLET BY MOUTH TWICE DAILY WITH A MEAL. 60 tablet 0  . colchicine 0.6 MG tablet Take 0.6 mg by mouth 2 (two) times daily as needed (gout). Reported on 11/06/2015    . feeding supplement, ENSURE ENLIVE, (ENSURE ENLIVE) LIQD Take 237 mLs by mouth 2 (two) times daily between meals. 237 mL 12  . ferrous sulfate 324 (65 FE) MG TBEC Take 1 tablet (325 mg total) by mouth 2 (two) times daily. 60 tablet   . furosemide (LASIX) 40 MG tablet TAKE 1 TABLET BY MOUTH TWICE DAILY. 60 tablet 3  . glimepiride (AMARYL) 2 MG tablet Take 2 mg by mouth daily.    . isosorbide mononitrate (IMDUR) 30 MG 24 hr tablet Take 1 tablet (30 mg total) by mouth daily. 30 tablet 0  . levothyroxine (SYNTHROID, LEVOTHROID) 25 MCG tablet Take 1 tablet (25 mcg total) by mouth daily before breakfast. 30 tablet 0  . lisinopril (PRINIVIL,ZESTRIL) 10 MG tablet TAKE ONE TABLET BY MOUTH DAILY. 30 tablet 0  . metoCLOPramide (REGLAN) 5 MG tablet Take 5 mg by mouth 2 (two) times daily.     . ondansetron (ZOFRAN) 4 MG tablet Take 4 mg by mouth every 4 (four) hours as needed for nausea or vomiting. Reported on 11/06/2015    . oxyCODONE-acetaminophen (PERCOCET) 10-325 MG per tablet Take 1 tablet by mouth every 6 (six) hours as needed for pain.     . polyethylene glycol (MIRALAX / GLYCOLAX) packet Take 17 g by mouth daily as needed for mild constipation.    . pravastatin (PRAVACHOL) 40 MG tablet Take 80 mg by mouth at bedtime.     Marland Kitchen spironolactone (ALDACTONE) 25 MG tablet TAKE 1/2 TABLET BY MOUTH DAILY. 45 tablet 2  . warfarin (COUMADIN) 2.5 MG tablet Take 0.5-1 tablets (1.25-2.5 mg total) by mouth daily at 6 PM. Takes 1 tablet on Mon, Wed, Fri, take 0.5 tablet on all other days     No current facility-administered medications for this visit.     REVIEW OF SYSTEMS:   [X]  denotes positive finding, [ ]  denotes negative  finding Cardiac  Comments:  Chest pain or chest pressure:    Shortness of breath upon exertion:    Short of breath when lying flat:    Irregular heart rhythm:        Vascular    Pain in calf, thigh, or hip brought on by ambulation:    Pain in feet at night that  wakes you up from your sleep:     Blood clot in your veins:    Leg swelling:         Pulmonary    Oxygen at home:    Productive cough:     Wheezing:         Neurologic    Sudden weakness in arms or legs:     Sudden numbness in arms or legs:     Sudden onset of difficulty speaking or slurred speech:    Temporary loss of vision in one eye:     Problems with dizziness:         Gastrointestinal    Blood in stool:      Vomited blood:         Genitourinary    Burning when urinating:     Blood in urine:        Psychiatric    Major depression:         Hematologic    Bleeding problems:    Problems with blood clotting too easily:        Skin    Rashes or ulcers:        Constitutional    Fever or chills:     PHYSICAL EXAM:   Vitals:   02/25/17 1558  BP: 111/67  Pulse: 69  Resp: 18  Temp: 98.6 F (37 C)  TempSrc: Oral  SpO2: 91%  Weight: 190 lb 6.4 oz (86.4 kg)  Height: 5\' 5"  (1.651 m)    GENERAL: The patient is a well-nourished female, in no acute distress. The vital signs are documented above. CARDIAC: There is a regular rate and rhythm.  VASCULAR: Palpable right radial pulse PULMONARY: Nonlabored respirations MUSCULOSKELETAL: There are no major deformities or cyanosis. NEUROLOGIC: No focal weakness or paresthesias are detected. SKIN: There are no ulcers or rashes noted. PSYCHIATRIC: The patient has a normal affect.  STUDIES:   I have reviewed her vein mapping studies.  It appears that the best vein visible was a right basilic vein with measurements greater than 0.5 cm throughout.  Arterial duplex shows triphasic waveforms  ASSESSMENT and PLAN   Chronic renal insufficiency: I discussed with  the patient and her husband that based on vein mapping, her best option would be a right basilic vein fistula.  I discussed doing this in 2 stages.  I discussed the risk of non-maturity as well as the risk of steal syndrome.  She will need to be off her Coumadin prior to her operation.  I will discuss this with Dr. Karie Kirks to determine whether or not she needs a Lovenox bridge.  I'm scheduling her procedure for Thursday, September 13.     Annamarie Major, MD [facility] [phone] Pager [phone]

## 2017-03-05 NOTE — Interval H&P Note (Signed)
History and Physical Interval Note:  03/05/2017 7:51 AM  [name]  has presented today for surgery, with the diagnosis of Chronic Kidney Disease Stage 5   N18.5  The various methods of treatment have been discussed with the patient and family. After consideration of risks, benefits and other options for treatment, the patient has consented to  Procedure(s): RIGHT 1ST STAGE [place] (Right) as a surgical intervention .  The patient's history has been reviewed, patient examined, no change in status, stable for surgery.  I have reviewed the patient's chart and labs.  Questions were answered to the patient's satisfaction.     Annamarie Major

## 2017-03-05 NOTE — Anesthesia Preprocedure Evaluation (Addendum)
Anesthesia Evaluation  Patient identified by MRN, date of birth, ID band Patient awake    Reviewed: Allergy & Precautions, H&P , NPO status , Patient's Chart, lab work & pertinent test results, reviewed documented beta blocker date and time   History of Anesthesia Complications Negative for: history of anesthetic complications  Airway Mallampati: II  TM Distance: >3 FB     Dental  (+) Edentulous Upper, Edentulous Lower   Pulmonary sleep apnea and Continuous Positive Airway Pressure Ventilation , former smoker, PE   breath sounds clear to auscultation       Cardiovascular hypertension, Pt. on medications and Pt. on home beta blockers +CHF  + dysrhythmias Atrial Fibrillation  Rhythm:Irregular Rate:Normal  ECG: SB, 1st degree AV block, LBBB. Rate 52  ECHO: Moderately dilated LV with EF 20%, diffuse hypokinesis. Mild to moderate mitral regurgitation. Normal RV size and systolic function. Severe left atrial enlargement. Mild pulmonary hypertension.  Cardiologist is Loralie Champagne, MD; last office visit 04/21/16.     Neuro/Psych    GI/Hepatic   Endo/Other  diabetes, Type 2, Oral Hypoglycemic AgentsHypothyroidism   Renal/GU CRFRenal disease     Musculoskeletal   Abdominal (+) + obese,   Peds  Hematology   Anesthesia Other Findings   Reproductive/Obstetrics                            Anesthesia Physical  Anesthesia Plan  ASA: IV  Anesthesia Plan: MAC   Post-op Pain Management:    Induction: Intravenous  PONV Risk Score and Plan: 2 and Ondansetron and Propofol infusion  Airway Management Planned: Natural Airway  Additional Equipment:   Intra-op Plan:   Post-operative Plan:   Informed Consent: I have reviewed the patients History and Physical, chart, labs and discussed the procedure including the risks, benefits and alternatives for the proposed anesthesia with the patient or  authorized representative who has indicated his/her understanding and acceptance.   Dental advisory given  Plan Discussed with: CRNA  Anesthesia Plan Comments:       Anesthesia Quick Evaluation

## 2017-03-06 ENCOUNTER — Encounter (HOSPITAL_COMMUNITY): Payer: Self-pay | Admitting: Surgery

## 2017-03-06 ENCOUNTER — Ambulatory Visit (HOSPITAL_COMMUNITY): Payer: Medicare HMO

## 2017-03-06 ENCOUNTER — Other Ambulatory Visit (HOSPITAL_COMMUNITY): Payer: Medicare HMO

## 2017-03-06 ENCOUNTER — Ambulatory Visit (HOSPITAL_COMMUNITY): Payer: Medicare HMO | Admitting: Oncology

## 2017-03-09 ENCOUNTER — Encounter (HOSPITAL_COMMUNITY): Payer: Medicare HMO | Attending: Oncology

## 2017-03-09 ENCOUNTER — Encounter (HOSPITAL_COMMUNITY): Payer: Medicare HMO

## 2017-03-09 ENCOUNTER — Encounter (HOSPITAL_BASED_OUTPATIENT_CLINIC_OR_DEPARTMENT_OTHER): Payer: Medicare HMO | Admitting: Oncology

## 2017-03-09 ENCOUNTER — Encounter (HOSPITAL_COMMUNITY): Payer: Self-pay | Admitting: Oncology

## 2017-03-09 VITALS — BP 110/47 | HR 71 | Resp 18 | Ht 65.0 in | Wt 190.0 lb

## 2017-03-09 DIAGNOSIS — D631 Anemia in chronic kidney disease: Secondary | ICD-10-CM

## 2017-03-09 DIAGNOSIS — N189 Chronic kidney disease, unspecified: Secondary | ICD-10-CM | POA: Insufficient documentation

## 2017-03-09 DIAGNOSIS — N184 Chronic kidney disease, stage 4 (severe): Principal | ICD-10-CM

## 2017-03-09 LAB — CBC WITH DIFFERENTIAL/PLATELET
BASOS ABS: 0 10*3/uL (ref 0.0–0.1)
Basophils Relative: 1 %
EOS PCT: 6 %
Eosinophils Absolute: 0.3 10*3/uL (ref 0.0–0.7)
HEMATOCRIT: 29.9 % — AB (ref 36.0–46.0)
Hemoglobin: 9.9 g/dL — ABNORMAL LOW (ref 12.0–15.0)
LYMPHS PCT: 22 %
Lymphs Abs: 1.2 10*3/uL (ref 0.7–4.0)
MCH: 31.9 pg (ref 26.0–34.0)
MCHC: 33.1 g/dL (ref 30.0–36.0)
MCV: 96.5 fL (ref 78.0–100.0)
MONOS PCT: 11 %
Monocytes Absolute: 0.6 10*3/uL (ref 0.1–1.0)
NEUTROS ABS: 3.3 10*3/uL (ref 1.7–7.7)
Neutrophils Relative %: 60 %
PLATELETS: 173 10*3/uL (ref 150–400)
RBC: 3.1 MIL/uL — ABNORMAL LOW (ref 3.87–5.11)
RDW: 13 % (ref 11.5–15.5)
WBC: 5.4 10*3/uL (ref 4.0–10.5)

## 2017-03-09 MED ORDER — DARBEPOETIN ALFA 60 MCG/0.3ML IJ SOSY
60.0000 ug | PREFILLED_SYRINGE | Freq: Once | INTRAMUSCULAR | Status: AC
  Administered 2017-03-09: 60 ug via SUBCUTANEOUS
  Filled 2017-03-09: qty 0.3

## 2017-03-09 NOTE — Progress Notes (Signed)
[name] presents today for injection per MD orders. Aranesp 60 mcg administered SQ in right lower abdomen. Administration without incident. Patient tolerated well. Patient discharged ambulatory with walker and in stable condition from clinic with spouse. Patient to follow up as scheduled.

## 2017-03-09 NOTE — Patient Instructions (Addendum)
[place] at [facility] Discharge Instructions  RECOMMENDATIONS MADE BY THE CONSULTANT AND ANY TEST RESULTS WILL BE SENT TO YOUR REFERRING PHYSICIAN.  You were seen today by Dr. Oliva Bustard. Continue monthly labs and Aranesp injection. Return in 3 months for labs and follow up.    Thank you for choosing [place] at [facility] to provide your oncology and hematology care.  To afford each patient quality time with our provider, please arrive at least 15 minutes before your scheduled appointment time.    If you have a lab appointment with the [facility] please come in thru the  Main Entrance and check in at the main information desk  You need to re-schedule your appointment should you arrive 10 or more minutes late.  We strive to give you quality time with our providers, and arriving late affects you and other patients whose appointments are after yours.  Also, if you no show three or more times for appointments you may be dismissed from the clinic at the providers discretion.     Again, thank you for choosing [facility].  Our hope is that these requests will decrease the amount of time that you wait before being seen by our physicians.       _____________________________________________________________  Should you have questions after your visit to [facility], please contact our office at [phone] between the hours of 8:30 a.m. and 4:30 p.m.  Voicemails left after 4:30 p.m. will not be returned until the following business day.  For prescription refill requests, have your pharmacy contact our office.       Resources For Cancer Patients and their Caregivers ? American Cancer Society: Can assist with transportation, wigs, general needs, runs Look Good Feel Better.        (925)246-9855 ? Cancer Care: Provides financial assistance, online support groups, medication/co-pay assistance.  1-800-813-HOPE  206-330-1856) ? [facility] Assists [place] cancer patients and their families through emotional , educational and financial support.  4344846132 ? [place] DSS Where to apply for food stamps, Medicaid and utility assistance. 416 317 2516 ? [facility]: Transportation to medical appointments. 508-293-3328 ? Social Security Administration: May apply for disability if have a Stage IV cancer. 423-646-4440 (404) 228-4771 ? [employer], Disability and Transit Services: Assists with nutrition, care and transit needs. [place] Support Programs: @10RELATIVEDAYS @ > Cancer Support Group  2nd Tuesday of the month 1pm-2pm, Journey Room  > Creative Journey  3rd Tuesday of the month 1130am-1pm, Journey Room  > Look Good Feel Better  1st Wednesday of the month 10am-12 noon, Journey Room (Call [place] to register [phone])

## 2017-03-09 NOTE — Progress Notes (Signed)
Tina Evens, MD [place] [address]  No diagnosis found.  CURRENT THERAPY: Aranesp 60 mcg every 4 weeks  INTERVAL HISTORY: [name] 71 y.o. female returns for followup of anemia of chronic renal disease, Stage IV, on ESA therapy.. Patient recently had the arteriovenous fistula none.  Feeling stronger.  Hemoglobin is 9.9 g.  Here for further follow-up and treatment consideration   HPI Elements   Location: Blood  Quality:   Severity: Moderate anemia, 10 g/dL range, in the setting of Stage IV renal disease  Duration: Years  Context: In the setting of chronic renal disease, chronic anticoagulation due to A-fib, COPD, DM type II, and chronic GI blood loss.  Timing: Chronic  Modifying Factors:   Associated Signs & Symptoms: Fatigue, weakness   She reports an appetite at 25% and and energy level 25%.  She denies any pain.  She continues to follow with her nephrologist.  She reports that her her kidneys are stable.  She denies any complaints today.  She denies any signs or symptoms of VTE including unilateral extremity swelling/erythema/pain/feet and pleuritic chest pain or sudden onset of shortness of breath.  Review of Systems  Constitutional: Negative.  Negative for chills, fever and weight loss.  HENT: Negative.   Eyes: Negative.   Respiratory: Negative.  Negative for cough.   Cardiovascular: Negative.  Negative for chest pain.  Gastrointestinal: Negative.  Negative for blood in stool, constipation, diarrhea, melena, nausea and vomiting.  Genitourinary: Negative.   Musculoskeletal: Negative.   Skin: Negative.   Neurological: Negative.  Negative for weakness.  Endo/Heme/Allergies: Negative.   Psychiatric/Behavioral: Negative.   Ecchymosis at the right elbow at the site of arteriovenous fistula  Past Medical History:  Diagnosis Date  . Anemia    a. mild/chronic  . Anemia due to GI blood loss 02/26/2016  . Anemia in chronic renal  disease 11/30/2014  . Cardiomyopathy, nonischemic ([place])    a. 1999 nl cath;  b. 12/05 [employer] [place];  c. 10/2005 ICD extraction 2/2 enterococcus bacteremia and Veg on RV lead;  c. 05/2008 low risk Myoview (scarring w/ some evidence of inf ischemia);  d. 11/2012 Echo: EF 15-20%;  e. 01/2013 s/p MDT [place] CRT D, ser # [id number];  f. 05/2013 Echo: EF 15%.  . Chronic systolic CHF (congestive heart failure) ([place])    a. 11/2012 Echo: EF 15-20%;  b. 05/2013 TEE EF 15%.  . CKD (chronic kidney disease), stage III    creatinin-1.44 in 1/09; 1.51 in 1/10  . Degenerative joint disease    of knees, shoulder, and hips  . Enterococcal infection    a. 10/2005 - AICD-explanted  . GERD (gastroesophageal reflux disease)   . Hilar density    a. infrahilar mass/adenopathy on CT scan 5/07; subsequently  resolved  . History of blood transfusion   . Hyperlipidemia   . Hypertension   . Hypothyroidism   . ICD (implantable cardioverter-defibrillator) infection ([place])    removed 2016  . Implantable cardioverter-defibrillator-CRT- Mdt    a.  01/2013 s/p MDT [place] CRT D, ser # [id number] - ICD has been removed  . LBBB (left bundle branch block)   . Obstructive sleep apnea    uses cpap  . PAF (paroxysmal atrial fibrillation) ([place])    a. 07/2012 s/p TEE/DCCV;  b. chronic coumadin;  c. 05/2013 Recurrent Afib->TEE/DCCV and amio initiation.  . Peripheral vascular disease ([place])   . Pneumonia 07/2005  .  Pulmonary embolism ([place])    a. 07/2005 after total right hip arthroplasty  . Tobacco abuse    a. discontinued in 1997, and then resumed  . Type II diabetes mellitus ([place])    type 2  . Urinary incontinence   . Villous adenoma of colon    a. tubovillous adenomatous polyp with focal high grade dysplasia; presented with hematochezia - followed by Dr. Laural Golden.    Past Surgical History:  Procedure Laterality Date  . A-V CARDIAC PACEMAKER INSERTION  12/05   Biventricular pacemaker/AICD  . ABDOMINAL HYSTERECTOMY  1990/92    Initial partial hysterectomy followed by BSO  . BASCILIC VEIN TRANSPOSITION Right 03/05/2017   Procedure: RIGHT 1ST STAGE BASCILIC VEIN TRANSPOSITION;  Surgeon: Serafina Mitchell, MD;  [facility]: [facility];  Service: Vascular;  Laterality: Right;  . BI-VENTRICULAR IMPLANTABLE CARDIOVERTER DEFIBRILLATOR N/A 01/24/2013   Procedure: BI-VENTRICULAR IMPLANTABLE CARDIOVERTER DEFIBRILLATOR  (CRT-D);  Surgeon: Evans Lance, MD;  Location: [facility] CATH LAB;  Service: Cardiovascular;  Laterality: N/A;  . BI-VENTRICULAR IMPLANTABLE CARDIOVERTER DEFIBRILLATOR  (CRT-D)  01/24/2013  . CARDIAC CATHETERIZATION    . CARDIOVERSION N/A 08/20/2012   Procedure: TEE GUIDED CARDIOVERSION;  Surgeon: Yehuda Savannah, MD;  Location: [facility] ORS;  Service: Cardiovascular;  Laterality: N/A;  To be done @ bedside  . CARDIOVERSION N/A 06/20/2013   Procedure: CARDIOVERSION;  Surgeon: Dorothy Spark, MD;  Location: [place];  Service: Cardiovascular;  Laterality: N/A;  . CATARACT EXTRACTION W/PHACO Left 08/07/2015   Procedure: CATARACT EXTRACTION PHACO AND INTRAOCULAR LENS PLACEMENT ([place]);  Surgeon: Rutherford Guys, MD;  Location: [facility] ORS;  Service: Ophthalmology;  Laterality: Left;  CDE: 7.88  . CATARACT EXTRACTION W/PHACO Right 08/21/2015   Procedure: CATARACT EXTRACTION PHACO AND INTRAOCULAR LENS PLACEMENT (IOC);  Surgeon: Rutherford Guys, MD;  Location: [facility] ORS;  Service: Ophthalmology;  Laterality: Right;  CDE:8.55  . COLONOSCOPY Left 10/24/2014   Procedure: COLONOSCOPY;  Surgeon: Carol Ada, MD;  Location: [facility] ENDOSCOPY;  Service: Endoscopy;  Laterality: Left;  . COLONOSCOPY W/ POLYPECTOMY  2009  . COLONOSCOPY WITH ESOPHAGOGASTRODUODENOSCOPY (EGD) N/A 06/10/2013   Procedure: COLONOSCOPY WITH ESOPHAGOGASTRODUODENOSCOPY (EGD);  Surgeon: Rogene Houston, MD;  [facility]O [facility];  Service: Endoscopy;  Laterality: N/A;  925  . ESOPHAGOGASTRODUODENOSCOPY N/A 10/21/2014   Procedure: ESOPHAGOGASTRODUODENOSCOPY (EGD);  Surgeon: Inda Castle,  MD;  Location: [place];  Service: Endoscopy;  Laterality: N/A;  . GIVENS CAPSULE STUDY N/A 10/24/2014   Procedure: GIVENS CAPSULE STUDY;  Surgeon: Carol Ada, MD;  Location: [place];  Service: Endoscopy;  Laterality: N/A;  . ICD LEAD REMOVAL N/A 12/13/2014   Procedure: ICD LEAD REMOVAL/EXTRACTION ;  Surgeon: Evans Lance, MD;  Location: [place];  Service: Cardiovascular;  Laterality: N/A;  [place] back up  . KNEE ARTHROSCOPY Right 1980's?  Marland Kitchen PACEMAKER REMOVAL  11/18/05   Enterococcal infection  . RIGHT HEART CATHETERIZATION N/A 04/18/2014   Procedure: RIGHT HEART CATH;  Surgeon: Larey Dresser, MD;  Location: [facility] CATH LAB;  Service: Cardiovascular;  Laterality: N/A;  . TEE WITHOUT CARDIOVERSION N/A 08/20/2012   Procedure: TRANSESOPHAGEAL ECHOCARDIOGRAM (TEE);  Surgeon: Yehuda Savannah, MD;  Location[facility] ORS;  Service: Cardiovascular;  Laterality: N/A;  . TEE WITHOUT CARDIOVERSION N/A 06/20/2013   Procedure: TRANSESOPHAGEAL ECHOCARDIOGRAM (TEE);  Surgeon: Dorothy Spark, MD;  Location: [place];  Service: Cardiovascular;  Laterality: N/A;  . TEE WITHOUT CARDIOVERSION N/A 12/08/2014   Procedure: TRANSESOPHAGEAL ECHOCARDIOGRAM (TEE);  Surgeon: Fay Records, MD;  [facility];  Service: Cardiovascular;  Laterality: N/A;  . TOTAL HIP ARTHROPLASTY Right 07/2005  . TUBAL LIGATION  1980's    Family History  Problem Relation Age of Onset  . Hypertension Mother   . Diabetes Mother   . Coronary artery disease Father   . Diabetes Brother   . Hypertension Brother   . Lung cancer Brother   . Arthritis Other   . Diabetes Other   . Heart disease Other        female < 80    Social History   Social History  . Marital status: Married    Spouse name: N/A  . Number of children: N/A  . Years of education: N/A   Social History Main Topics  . Smoking status: Former Smoker    Years: 12.00    Types: Cigarettes  . Smokeless tobacco: Never Used     Comment: 05/2013: Smokes an  occasional cigarette.  Says that she doesn't inhale.  . Alcohol use No  . Drug use: No  . Sexual activity: Not Currently    Birth control/ protection: None   Other Topics Concern  . Not on file   Social History Narrative   Married, lives in [place] with spouse. Retired Secretary/administrator.      PHYSICAL EXAMINATION  ECOG PERFORMANCE STATUS: 1 - Symptomatic but completely ambulatory  There were no vitals filed for this visit.  Vitals - 1 value per visit 07/17/5807  SYSTOLIC 983  DIASTOLIC 48  Pulse 66  Temperature 98.3  Respirations 18    GENERAL:alert, no distress, well nourished, well developed, comfortable, cooperative, obese, smiling and accompanied by husband SKIN: skin color, texture, turgor are normal, no rashes or significant lesions HEAD: Normocephalic, No masses, lesions, tenderness or abnormalities EYES: normal, EOMI, Conjunctiva are pink and non-injected EARS: External ears normal OROPHARYNX:lips, buccal mucosa, and tongue normal and mucous membranes are moist  NECK: supple, trachea midline LYMPH:  no palpable lymphadenopathy BREAST:not examined LUNGS: clear to auscultation  HEART: regular rate & rhythm and no murmurs ABDOMEN:abdomen soft, obese and normal bowel sounds BACK: Back symmetric, no curvature. EXTREMITIES:less then 2 second capillary refill, no joint deformities, effusion, or inflammation, no skin discoloration, no cyanosis  NEURO: alert & oriented x 3 with fluent speech, no focal motor/sensory deficits, gait normal with assistance of rolling walker.   LABORATORY DATA: CBC    Component Value Date/Time   WBC 5.4 03/09/2017 0847   RBC 3.10 (L) 03/09/2017 0847   HGB 9.9 (L) 03/09/2017 0847   HCT 29.9 (L) 03/09/2017 0847   PLT 173 03/09/2017 0847   MCV 96.5 03/09/2017 0847   MCH 31.9 03/09/2017 0847   MCHC 33.1 03/09/2017 0847   RDW 13.0 03/09/2017 0847   LYMPHSABS 1.2 03/09/2017 0847   MONOABS 0.6 03/09/2017 0847   EOSABS 0.3 03/09/2017 0847    BASOSABS 0.0 03/09/2017 0847      Chemistry      Component Value Date/Time   NA 140 03/05/2017 0755   K 4.0 03/05/2017 0755   CL 102 02/06/2017 0837   CO2 24 02/06/2017 0837   BUN 51 (H) 02/06/2017 0837   CREATININE 2.95 (H) 02/06/2017 0837   CREATININE 1.17 (H) 01/20/2013 1010      Component Value Date/Time   CALCIUM 9.9 02/06/2017 0837   ALKPHOS 66 04/28/2016 1250   AST 14 (L) 04/28/2016 1250   ALT 10 (L) 04/28/2016 1250   BILITOT 0.9 04/28/2016 1250     Lab Results  Component Value Date  IRON 84 02/06/2017   TIBC 244 (L) 02/06/2017   FERRITIN 230 02/06/2017     CBC has been reviewed.  Hemoglobin is an acceptable range.  PATHOLOGY:    ASSESSMENT AND PLAN:    Continue Aranesp.   ORDERS PLACED FOR THIS ENCOUNTER: Reevaluate patient in 3 months unless patient go for dialysis than ESA can be continued at the dialysis center  [place]: No orders of the defined types were placed in this encounter.   THERAPY PLAN:  Continue with ESA support and ongoing surveillance of iron studies.  Will need to keep her ferritin above 100.  All questions were answered. The patient knows to call the clinic with any problems, questions or concerns. We can certainly see the patient much sooner if necessary.  Patient and plan discussed with Dr. Twana First and she is in agreement with the aforementioned.   This note is electronically signed by: Forest Gleason, MD 03/09/2017 9:15 AM

## 2017-03-09 NOTE — Patient Instructions (Signed)
[place] at [facility] Discharge Instructions  RECOMMENDATIONS MADE BY THE CONSULTANT AND ANY TEST RESULTS WILL BE SENT TO YOUR REFERRING PHYSICIAN.  Your Hgb was 9.9 today. You received your Aranesp injection. Continue to get it every 28 days.  Follow up as scheduled.  Thank you for choosing [place] at [facility] to provide your oncology and hematology care.  To afford each patient quality time with our provider, please arrive at least 15 minutes before your scheduled appointment time.    If you have a lab appointment with the [facility] please come in thru the  Main Entrance and check in at the main information desk  You need to re-schedule your appointment should you arrive 10 or more minutes late.  We strive to give you quality time with our providers, and arriving late affects you and other patients whose appointments are after yours.  Also, if you no show three or more times for appointments you may be dismissed from the clinic at the providers discretion.     Again, thank you for choosing [facility].  Our hope is that these requests will decrease the amount of time that you wait before being seen by our physicians.       _____________________________________________________________  Should you have questions after your visit to [facility], please contact our office at [phone] between the hours of 8:30 a.m. and 4:30 p.m.  Voicemails left after 4:30 p.m. will not be returned until the following business day.  For prescription refill requests, have your pharmacy contact our office.       Resources For Cancer Patients and their Caregivers ? American Cancer Society: Can assist with transportation, wigs, general needs, runs Look Good Feel Better.        901-531-9019 ? Cancer Care: Provides financial assistance, online support groups, medication/co-pay assistance.  1-800-813-HOPE (845) 672-0258) ? [facility] Assists [place] cancer patients and their families through emotional , educational and financial support.  (308)174-0114 ? [place] DSS Where to apply for food stamps, Medicaid and utility assistance. 2082944290 ? RCATS: Transportation to medical appointments. 561-460-3106 ? Social Security Administration: May apply for disability if have a Stage IV cancer. 610-165-1852 (518)175-9507 ? [employer], Disability and Transit Services: Assists with nutrition, care and transit needs. [place] Support Programs: @10RELATIVEDAYS @ > Cancer Support Group  2nd Tuesday of the month 1pm-2pm, Journey Room  > Creative Journey  3rd Tuesday of the month 1130am-1pm, Journey Room  > Look Good Feel Better  1st Wednesday of the month 10am-12 noon, Journey Room (Call [place] to register [phone])

## 2017-03-09 NOTE — Op Note (Signed)
    Patient name: [name] MRN: [id number] DOB: 1945-08-15 Sex: female  03/05/2017 Pre-operative Diagnosis: Chronic renal insufficiency Post-operative diagnosis:  Same Surgeon:  Annamarie Major Assistants:  Leontine Locket Procedure:   Right first stage basilic vein fistula Anesthesia:  Mac Blood Loss:  See anesthesia record Specimens:  None  Findings:  Excellent 5 mm vein  Indications:  The patient is here for renal access  Procedure:  The patient was identified in the holding area and taken to [place] [address]  The patient was then placed supine on the table. MAC anesthesia was administered.  The patient was prepped and draped in the usual sterile fashion.  A time out was called and antibiotics were administered.  Ultrasound was used to evaluate the basilic vein.  This appeared to be an excellent vein.  The cephalic vein was not visualized.  I made an oblique incision just above the antecubital crease.  I dissected down until I identified the basilic vein.  This was a healthy-appearing millimeters vein.  This was mobilized and side branches were ligated.  I then dissected out the brachial artery.  This was on the small side about 3 mm but disease free.  3000 units of heparin were given.  I ligated the vein distally.  The artery was then occluded with vascular clamps.  A #11 blade was used to make an arteriotomy which was extended longitudinally with Potts scissors.  The vein was cut to appropriate length and then spatulated to fit the size of the arteriotomy.  Prior to completion, the appropriate flushing maneuvers were performed and the anastomosis was completed.  The patient had wrist Doppler signals in the radial and ulnar arteries.  This a good thrill within the fistula.  25 mg of protamine were then administered.  The incision was closed with a layer of 30 followed by a layer of 4-0 Vicryl and Dermabond.   Disposition:  To PACU stable.   Theotis Burrow, M.D. [facility] Office: [phone] Pager:  [phone]

## 2017-04-06 ENCOUNTER — Encounter (HOSPITAL_BASED_OUTPATIENT_CLINIC_OR_DEPARTMENT_OTHER): Payer: Medicare HMO

## 2017-04-06 ENCOUNTER — Encounter (HOSPITAL_COMMUNITY): Payer: Self-pay

## 2017-04-06 ENCOUNTER — Encounter (HOSPITAL_COMMUNITY): Payer: Medicare HMO | Attending: Oncology

## 2017-04-06 VITALS — BP 101/54 | HR 70 | Resp 18

## 2017-04-06 DIAGNOSIS — D631 Anemia in chronic kidney disease: Secondary | ICD-10-CM | POA: Diagnosis not present

## 2017-04-06 DIAGNOSIS — N184 Chronic kidney disease, stage 4 (severe): Secondary | ICD-10-CM | POA: Insufficient documentation

## 2017-04-06 DIAGNOSIS — N189 Chronic kidney disease, unspecified: Principal | ICD-10-CM

## 2017-04-06 LAB — CBC WITH DIFFERENTIAL/PLATELET
BASOS ABS: 0 10*3/uL (ref 0.0–0.1)
BASOS PCT: 0 %
EOS ABS: 0.2 10*3/uL (ref 0.0–0.7)
Eosinophils Relative: 3 %
HCT: 29.8 % — ABNORMAL LOW (ref 36.0–46.0)
HEMOGLOBIN: 9.5 g/dL — AB (ref 12.0–15.0)
Lymphocytes Relative: 18 %
Lymphs Abs: 1 10*3/uL (ref 0.7–4.0)
MCH: 31.1 pg (ref 26.0–34.0)
MCHC: 31.9 g/dL (ref 30.0–36.0)
MCV: 97.7 fL (ref 78.0–100.0)
Monocytes Absolute: 0.4 10*3/uL (ref 0.1–1.0)
Monocytes Relative: 8 %
NEUTROS PCT: 71 %
Neutro Abs: 3.8 10*3/uL (ref 1.7–7.7)
PLATELETS: 193 10*3/uL (ref 150–400)
RBC: 3.05 MIL/uL — AB (ref 3.87–5.11)
RDW: 13.2 % (ref 11.5–15.5)
WBC: 5.3 10*3/uL (ref 4.0–10.5)

## 2017-04-06 MED ORDER — DARBEPOETIN ALFA 60 MCG/0.3ML IJ SOSY
60.0000 ug | PREFILLED_SYRINGE | Freq: Once | INTRAMUSCULAR | Status: AC
  Administered 2017-04-06: 60 ug via SUBCUTANEOUS

## 2017-04-06 MED ORDER — DARBEPOETIN ALFA 60 MCG/0.3ML IJ SOSY
PREFILLED_SYRINGE | INTRAMUSCULAR | Status: AC
  Filled 2017-04-06: qty 0.3

## 2017-04-06 NOTE — Progress Notes (Signed)
[name] presents today for injection per MD orders. Aranesp 29mcg administered SQ in right Abdomen. Administration without incident. Patient tolerated well.   Treatment given per orders. Patient tolerated it well without problems. Vitals stable and discharged home from clinic ambulatory. Follow up as scheduled.

## 2017-04-06 NOTE — Patient Instructions (Signed)
[facility] at [facility] Discharge Instructions  RECOMMENDATIONS MADE BY THE CONSULTANT AND ANY TEST RESULTS WILL BE SENT TO YOUR REFERRING PHYSICIAN.  Aranesp given today  Follow up as scheduled.  Thank you for choosing [facility] at [facility] to provide your oncology and hematology care.  To afford each patient quality time with our provider, please arrive at least 15 minutes before your scheduled appointment time.    If you have a lab appointment with the Cancer Center please come in thru the  Main Entrance and check in at the main information desk  You need to re-schedule your appointment should you arrive 10 or more minutes late.  We strive to give you quality time with our providers, and arriving late affects you and other patients whose appointments are after yours.  Also, if you no show three or more times for appointments you may be dismissed from the clinic at the providers discretion.     Again, thank you for choosing [facility].  Our hope is that these requests will decrease the amount of time that you wait before being seen by our physicians.       _____________________________________________________________  Should you have questions after your visit to [facility], please contact our office at [phone] between the hours of 8:30 a.m. and 4:30 p.m.  Voicemails left after 4:30 p.m. will not be returned until the following business day.  For prescription refill requests, have your pharmacy contact our office.       Resources For Cancer Patients and their Caregivers ? American Cancer Society: Can assist with transportation, wigs, general needs, runs Look Good Feel Better.        1-888-227-6333 ? Cancer Care: Provides financial assistance, online support groups, medication/co-pay assistance.  1-800-813-HOPE (4673) ? [facility] Assists [place] cancer patients and  their families through emotional , educational and financial support.  336-427-4357 ? [place] DSS Where to apply for food stamps, Medicaid and utility assistance. 336-342-1394 ? RCATS: Transportation to medical appointments. 336-347-2287 ? Social Security Administration: May apply for disability if have a Stage IV cancer. 336-342-7796 1-800-772-1213 ? [place] Aging, Disability and Transit Services: Assists with nutrition, care and transit needs. 336-349-2343  Cancer Center Support Programs: @10RELATIVEDAYS@ > Cancer Support Group  2nd Tuesday of the month 1pm-2pm, Journey Room  > Creative Journey  3rd Tuesday of the month 1130am-1pm, Journey Room  > Look Good Feel Better  1st Wednesday of the month 10am-12 noon, Journey Room (Call American Cancer [employer] to register 1-800-395-5775)   

## 2017-05-04 ENCOUNTER — Encounter (HOSPITAL_COMMUNITY): Payer: Medicare HMO | Attending: Oncology

## 2017-05-04 ENCOUNTER — Encounter (HOSPITAL_BASED_OUTPATIENT_CLINIC_OR_DEPARTMENT_OTHER): Payer: Medicare HMO

## 2017-05-04 VITALS — BP 100/46 | HR 66 | Temp 98.0°F | Resp 16

## 2017-05-04 DIAGNOSIS — N184 Chronic kidney disease, stage 4 (severe): Secondary | ICD-10-CM | POA: Diagnosis present

## 2017-05-04 DIAGNOSIS — D631 Anemia in chronic kidney disease: Secondary | ICD-10-CM | POA: Diagnosis not present

## 2017-05-04 DIAGNOSIS — N189 Chronic kidney disease, unspecified: Principal | ICD-10-CM

## 2017-05-04 LAB — CBC WITH DIFFERENTIAL/PLATELET
Basophils Absolute: 0 10*3/uL (ref 0.0–0.1)
Basophils Relative: 0 %
Eosinophils Absolute: 0.1 10*3/uL (ref 0.0–0.7)
Eosinophils Relative: 2 %
HEMATOCRIT: 29.4 % — AB (ref 36.0–46.0)
HEMOGLOBIN: 9.5 g/dL — AB (ref 12.0–15.0)
LYMPHS ABS: 1.2 10*3/uL (ref 0.7–4.0)
LYMPHS PCT: 18 %
MCH: 31.8 pg (ref 26.0–34.0)
MCHC: 32.3 g/dL (ref 30.0–36.0)
MCV: 98.3 fL (ref 78.0–100.0)
Monocytes Absolute: 0.5 10*3/uL (ref 0.1–1.0)
Monocytes Relative: 7 %
NEUTROS PCT: 73 %
Neutro Abs: 4.9 10*3/uL (ref 1.7–7.7)
Platelets: 180 10*3/uL (ref 150–400)
RBC: 2.99 MIL/uL — AB (ref 3.87–5.11)
RDW: 13.4 % (ref 11.5–15.5)
WBC: 6.7 10*3/uL (ref 4.0–10.5)

## 2017-05-04 MED ORDER — DARBEPOETIN ALFA 60 MCG/0.3ML IJ SOSY
60.0000 ug | PREFILLED_SYRINGE | Freq: Once | INTRAMUSCULAR | Status: AC
  Administered 2017-05-04: 60 ug via SUBCUTANEOUS
  Filled 2017-05-04: qty 0.3

## 2017-05-05 ENCOUNTER — Encounter (HOSPITAL_COMMUNITY): Payer: Self-pay

## 2017-05-05 NOTE — Patient Instructions (Signed)
[facility] at [facility] Discharge Instructions  RECOMMENDATIONS MADE BY THE CONSULTANT AND ANY TEST RESULTS WILL BE SENT TO YOUR REFERRING PHYSICIAN.  Aranesp given Follow up as scheduled  Thank you for choosing [facility] at [facility] to provide your oncology and hematology care.  To afford each patient quality time with our provider, please arrive at least 15 minutes before your scheduled appointment time.    If you have a lab appointment with the Cancer Center please come in thru the  Main Entrance and check in at the main information desk  You need to re-schedule your appointment should you arrive 10 or more minutes late.  We strive to give you quality time with our providers, and arriving late affects you and other patients whose appointments are after yours.  Also, if you no show three or more times for appointments you may be dismissed from the clinic at the providers discretion.     Again, thank you for choosing  Cancer Center.  Our hope is that these requests will decrease the amount of time that you wait before being seen by our physicians.       _____________________________________________________________  Should you have questions after your visit to  Cancer Center, please contact our office at [phone] between the hours of 8:30 a.m. and 4:30 p.m.  Voicemails left after 4:30 p.m. will not be returned until the following business day.  For prescription refill requests, have your pharmacy contact our office.       Resources For Cancer Patients and their Caregivers ? American Cancer Society: Can assist with transportation, wigs, general needs, runs Look Good Feel Better.        1-888-227-6333 ? Cancer Care: Provides financial assistance, online support groups, medication/co-pay assistance.  1-800-813-HOPE (4673) ? [facility] Assists [place] cancer patients and their  families through emotional , educational and financial support.  336-427-4357 ? [place] DSS Where to apply for food stamps, Medicaid and utility assistance. 336-342-1394 ? RCATS: Transportation to medical appointments. 336-347-2287 ? Social Security Administration: May apply for disability if have a Stage IV cancer. 336-342-7796 1-800-772-1213 ? [place] Aging, Disability and Transit Services: Assists with nutrition, care and transit needs. 336-349-2343  Cancer Center Support Programs: @10RELATIVEDAYS@ > Cancer Support Group  2nd Tuesday of the month 1pm-2pm, Journey Room  > Creative Journey  3rd Tuesday of the month 1130am-1pm, Journey Room  > Look Good Feel Better  1st Wednesday of the month 10am-12 noon, Journey Room (Call American [employer] to register 1-800-395-5775)   

## 2017-05-05 NOTE — Progress Notes (Signed)
[name] presents today for injection per MD orders. Aranesp 60mg  administered SQ in Abdomen. Administration without incident. Patient tolerated well.   Treatment given per orders. Patient tolerated it well without problems. Vitals stable and discharged home from clinic ambulatory. Follow up as scheduled.

## 2017-05-12 ENCOUNTER — Other Ambulatory Visit: Payer: Self-pay

## 2017-05-12 DIAGNOSIS — Z992 Dependence on renal dialysis: Secondary | ICD-10-CM

## 2017-05-12 DIAGNOSIS — Z48812 Encounter for surgical aftercare following surgery on the circulatory system: Secondary | ICD-10-CM

## 2017-05-12 DIAGNOSIS — N186 End stage renal disease: Secondary | ICD-10-CM

## 2017-05-13 ENCOUNTER — Ambulatory Visit (HOSPITAL_COMMUNITY)
Admission: RE | Admit: 2017-05-13 | Discharge: 2017-05-13 | Disposition: A | Discharge status: Home / Self Care | Payer: Medicare HMO | Source: Ambulatory Visit | Attending: Surgery | Admitting: Surgery

## 2017-05-13 DIAGNOSIS — Z992 Dependence on renal dialysis: Secondary | ICD-10-CM | POA: Diagnosis not present

## 2017-05-13 DIAGNOSIS — Z48812 Encounter for surgical aftercare following surgery on the circulatory system: Secondary | ICD-10-CM | POA: Insufficient documentation

## 2017-05-13 DIAGNOSIS — N186 End stage renal disease: Secondary | ICD-10-CM

## 2017-05-20 ENCOUNTER — Ambulatory Visit (INDEPENDENT_AMBULATORY_CARE_PROVIDER_SITE_OTHER): Payer: Medicare HMO | Admitting: Surgery

## 2017-05-20 ENCOUNTER — Encounter: Payer: Self-pay | Admitting: Surgery

## 2017-05-20 ENCOUNTER — Other Ambulatory Visit: Payer: Self-pay | Admitting: *Deleted

## 2017-05-20 ENCOUNTER — Telehealth: Payer: Self-pay | Admitting: *Deleted

## 2017-05-20 ENCOUNTER — Encounter: Payer: Self-pay | Admitting: *Deleted

## 2017-05-20 VITALS — BP 114/56 | HR 67 | Temp 97.3°F | Resp 18 | Ht 65.0 in | Wt 188.0 lb

## 2017-05-20 DIAGNOSIS — N184 Chronic kidney disease, stage 4 (severe): Secondary | ICD-10-CM

## 2017-05-20 NOTE — Telephone Encounter (Signed)
Verified with Kathryne Hitch. At Dr. Vickey Sages office they will stop Coumadin and bridge with Lovenox pre-op for patient. Called and spoke with patient's spouse regarding this and to expect call from Dr. Vickey Sages office and follow their instructions. Verbalized understanding.

## 2017-05-20 NOTE — Progress Notes (Signed)
Patient name: [name] MRN: [id number] DOB: 03/23/1946 Sex: female  REASON FOR VISIT:     post op  HISTORY OF PRESENT ILLNESS:   [name] is a 71 y.o. female who returns today for her first postoperative visit.  She is status post right first stage basilic vein fistula creation on 03/05/2017.  She is not yet on dialysis.  She denies any problems in her right arm.  She does not have any numbness in her hand.  CURRENT MEDICATIONS:    Current Outpatient Medications  Medication Sig Dispense Refill  . ACCU-CHEK AVIVA PLUS test strip     . amiodarone (PACERONE) 200 MG tablet Take 100 mg by mouth daily.     . calcitRIOL (ROCALTROL) 0.25 MCG capsule Take 0.25 mcg by mouth daily.    . carvedilol (COREG) 6.25 MG tablet TAKE 1 TABLET BY MOUTH TWICE DAILY WITH A MEAL. 60 tablet 0  . colchicine 0.6 MG tablet Take 0.6 mg by mouth 2 (two) times daily as needed (gout). Reported on 11/06/2015    . feeding supplement, ENSURE ENLIVE, (ENSURE ENLIVE) LIQD Take 237 mLs by mouth 2 (two) times daily between meals. 237 mL 12  . ferrous sulfate 324 (65 FE) MG TBEC Take 1 tablet (325 mg total) by mouth 2 (two) times daily. 60 tablet   . furosemide (LASIX) 40 MG tablet TAKE 1 TABLET BY MOUTH TWICE DAILY. 60 tablet 3  . glimepiride (AMARYL) 2 MG tablet Take 2 mg by mouth daily with breakfast.     . isosorbide mononitrate (IMDUR) 30 MG 24 hr tablet Take 1 tablet (30 mg total) by mouth daily. 30 tablet 0  . levothyroxine (SYNTHROID, LEVOTHROID) 50 MCG tablet Take 50 mcg by mouth daily before breakfast.    . lisinopril (PRINIVIL,ZESTRIL) 10 MG tablet TAKE ONE TABLET BY MOUTH DAILY. (Patient taking differently: TAKE ONE-HALF TABLET BY MOUTH DAILY.) 30 tablet 0  . metoCLOPramide (REGLAN) 5 MG tablet Take 5 mg by mouth 4 (four) times daily -  before meals and at bedtime.     . ondansetron (ZOFRAN) 4 MG tablet Take 4 mg by mouth every 4 (four) hours as needed for nausea or  vomiting. Reported on 11/06/2015    . oxyCODONE-acetaminophen (PERCOCET) 10-325 MG per tablet Take 1 tablet by mouth every 6 (six) hours as needed for pain.     . polyethylene glycol (MIRALAX / GLYCOLAX) packet Take 17 g by mouth daily as needed for mild constipation.    . polyethylene glycol powder (GLYCOLAX/MIRALAX) powder     . pravastatin (PRAVACHOL) 40 MG tablet Take 80 mg by mouth at bedtime.     Marland Kitchen spironolactone (ALDACTONE) 25 MG tablet TAKE 1/2 TABLET BY MOUTH DAILY. 45 tablet 2  . warfarin (COUMADIN) 2.5 MG tablet Take 0.5-1 tablets (1.25-2.5 mg total) by mouth daily at 6 PM. Takes 1 tablet on Mon, Wed, Fri, take 0.5 tablet on all other days (Patient taking differently: Take 1.25-2.5 mg by mouth daily at 6 PM. Takes 1 tablet daily except 0.5mg  tablet on Mon, Wed, Fri.)     No current facility-administered medications for this visit.     REVIEW OF SYSTEMS:   [X]  denotes positive finding, [ ]  denotes negative finding Cardiac  Comments:  Chest pain or chest pressure:    Shortness of breath upon exertion:    Short of breath when lying flat:    Irregular heart rhythm:    Constitutional    Fever or chills:  PHYSICAL EXAM:   Vitals:   05/20/17 0828  BP: (!) 114/56  Pulse: 67  Resp: 18  Temp: (!) 97.3 F (36.3 C)  TempSrc: Oral  SpO2: 94%  Weight: 188 lb (85.3 kg)  Height: 5\' 5"  (1.651 m)    GENERAL: The patient is a well-nourished female, in no acute distress. The vital signs are documented above. CARDIOVASCULAR: There is a regular rate and rhythm. PULMONARY: Non-labored respirations Excellent thrill within the fistula.  STUDIES:   Venous ultrasound shows an excellent basilic vein measuring from 0.45 cm at the anastomosis up to 1.5 cm in the upper arm.   MEDICAL ISSUES:   Status post right first stage basilic vein fistula creation: The patient will be scheduled for a second stage procedure in early December.  Details of the operation were discussed with the  patient.  She is on Coumadin which we will discontinue prior to her operation.  Annamarie Major, MD [facility] [phone] Pager [phone]

## 2017-05-20 NOTE — H&P (View-Only) (Signed)
Patient name: [name] MRN: [id number] DOB: 1945/08/31 Sex: female  REASON FOR VISIT:     post op  HISTORY OF PRESENT ILLNESS:   [name] is a 71 y.o. female who returns today for her first postoperative visit.  She is status post right first stage basilic vein fistula creation on 03/05/2017.  She is not yet on dialysis.  She denies any problems in her right arm.  She does not have any numbness in her hand.  CURRENT MEDICATIONS:    Current Outpatient Medications  Medication Sig Dispense Refill  . ACCU-CHEK AVIVA PLUS test strip     . amiodarone (PACERONE) 200 MG tablet Take 100 mg by mouth daily.     . calcitRIOL (ROCALTROL) 0.25 MCG capsule Take 0.25 mcg by mouth daily.    . carvedilol (COREG) 6.25 MG tablet TAKE 1 TABLET BY MOUTH TWICE DAILY WITH A MEAL. 60 tablet 0  . colchicine 0.6 MG tablet Take 0.6 mg by mouth 2 (two) times daily as needed (gout). Reported on 11/06/2015    . feeding supplement, ENSURE ENLIVE, (ENSURE ENLIVE) LIQD Take 237 mLs by mouth 2 (two) times daily between meals. 237 mL 12  . ferrous sulfate 324 (65 FE) MG TBEC Take 1 tablet (325 mg total) by mouth 2 (two) times daily. 60 tablet   . furosemide (LASIX) 40 MG tablet TAKE 1 TABLET BY MOUTH TWICE DAILY. 60 tablet 3  . glimepiride (AMARYL) 2 MG tablet Take 2 mg by mouth daily with breakfast.     . isosorbide mononitrate (IMDUR) 30 MG 24 hr tablet Take 1 tablet (30 mg total) by mouth daily. 30 tablet 0  . levothyroxine (SYNTHROID, LEVOTHROID) 50 MCG tablet Take 50 mcg by mouth daily before breakfast.    . lisinopril (PRINIVIL,ZESTRIL) 10 MG tablet TAKE ONE TABLET BY MOUTH DAILY. (Patient taking differently: TAKE ONE-HALF TABLET BY MOUTH DAILY.) 30 tablet 0  . metoCLOPramide (REGLAN) 5 MG tablet Take 5 mg by mouth 4 (four) times daily -  before meals and at bedtime.     . ondansetron (ZOFRAN) 4 MG tablet Take 4 mg by mouth every 4 (four) hours as needed for nausea or  vomiting. Reported on 11/06/2015    . oxyCODONE-acetaminophen (PERCOCET) 10-325 MG per tablet Take 1 tablet by mouth every 6 (six) hours as needed for pain.     . polyethylene glycol (MIRALAX / GLYCOLAX) packet Take 17 g by mouth daily as needed for mild constipation.    . polyethylene glycol powder (GLYCOLAX/MIRALAX) powder     . pravastatin (PRAVACHOL) 40 MG tablet Take 80 mg by mouth at bedtime.     Marland Kitchen spironolactone (ALDACTONE) 25 MG tablet TAKE 1/2 TABLET BY MOUTH DAILY. 45 tablet 2  . warfarin (COUMADIN) 2.5 MG tablet Take 0.5-1 tablets (1.25-2.5 mg total) by mouth daily at 6 PM. Takes 1 tablet on Mon, Wed, Fri, take 0.5 tablet on all other days (Patient taking differently: Take 1.25-2.5 mg by mouth daily at 6 PM. Takes 1 tablet daily except 0.5mg  tablet on Mon, Wed, Fri.)     No current facility-administered medications for this visit.     REVIEW OF SYSTEMS:   [X]  denotes positive finding, [ ]  denotes negative finding Cardiac  Comments:  Chest pain or chest pressure:    Shortness of breath upon exertion:    Short of breath when lying flat:    Irregular heart rhythm:    Constitutional    Fever or chills:  PHYSICAL EXAM:   Vitals:   05/20/17 0828  BP: (!) 114/56  Pulse: 67  Resp: 18  Temp: (!) 97.3 F (36.3 C)  TempSrc: Oral  SpO2: 94%  Weight: 188 lb (85.3 kg)  Height: 5\' 5"  (1.651 m)    GENERAL: The patient is a well-nourished female, in no acute distress. The vital signs are documented above. CARDIOVASCULAR: There is a regular rate and rhythm. PULMONARY: Non-labored respirations Excellent thrill within the fistula.  STUDIES:   Venous ultrasound shows an excellent basilic vein measuring from 0.45 cm at the anastomosis up to 1.5 cm in the upper arm.   MEDICAL ISSUES:   Status post right first stage basilic vein fistula creation: The patient will be scheduled for a second stage procedure in early December.  Details of the operation were discussed with the  patient.  She is on Coumadin which we will discontinue prior to her operation.  Annamarie Major, MD [facility] [phone] Pager [phone]

## 2017-05-20 NOTE — Progress Notes (Signed)
Faxed clearance form to Dr. Vickey Sages office for medical clearance [id number].

## 2017-05-27 ENCOUNTER — Encounter (HOSPITAL_COMMUNITY): Payer: Self-pay

## 2017-05-27 ENCOUNTER — Other Ambulatory Visit: Payer: Self-pay

## 2017-05-27 NOTE — Progress Notes (Signed)
PCP - Dr. Karie Kirks  Cardiologist - Dr. Aundra Dubin  Chest x-ray - Denies  EKG - 03/05/17 (E)  Stress Test - 06/10/08 (E)  ECHO - 03/06/16 (E)  Cardiac Cath - 01/2013 (E)  Sleep Study - Yes CPAP - Yes-Does not know settings  LABS- 05/28/17- I-STAT 4, PT, UA, HA1C  HA1C- 12/6 Fasting Blood Sugar -  Checks Blood Sugar ___1__ time a month  All information received from her husband Fritz Pickerel. Fritz Pickerel advised to have pt check her BS in the am, and if the result is 70 and below, give 1/2 cup of clear juice (apple or cranberry), and to recheck the BS in 15 minutes. Fritz Pickerel also told to inform the staff if the BS was 220 and above upon arrival. Fritz Pickerel sts the pt's last dose of Coumadin was on Friday, 11/30, and she started Lovenox injections on Monday. Pt has stopped the Lovenox and will resume on Friday, per her doctor.  Chart will be given to anesthesia for review due to cardiac history.  Pt denies having chest pain, sob, or fever during the phone interview. Pt to arrive to the Short Stay area at 5:30AM on 05/28/17. All instructions explained to the pt and husband, with a verbal understanding. The opportunity to ask questions was provided.

## 2017-05-27 NOTE — Progress Notes (Signed)
Anesthesia Chart Review:  Pt is a same day work up.   Pt is a 71 year old female scheduled for R 2nd stage basilic vein transposition on 05/26/2017 with Harold Barban, MD  - PCP is Leslie Andrea, MD - cardiologist is Loralie Champagne, MD; last office visit 04/21/16.   PMH includes:  Nonischemic cardiomyopathy (dx 1999; 2 prior ICDs, both explanted for bacteremia with vegetation on TEE; last ICD explanted 2016; no plans for re-insertion ICD), chronic systolic HF, LBBB, PAF, HTN, DM, hyperlipidemia, CKD (stage IV), OSA, anemia, PE (2007), GERD. Former smoker. S/p 1st stage basilic vein transposition 03/05/17.  Medications include: amiodarone, carvedilol, iron, lasix, glimepiride, imdur, levothyroxine, lisinopril, pravastatin, spironolactone, coumadin. Last dose coumadin 05/22/17. Lovenox bridge started 05/25/17.   Labs will be obtained day of surgery.   EKG 03/05/17: Sinus rhythm with Fusion complexes. LAD. LBBB.   Echo 03/06/16:  - Left ventricle: The cavity size was moderately dilated. Wall thickness was normal. The estimated ejection fraction was 20%. Diffuse hypokinesis. Doppler parameters are consistent with abnormal left ventricular relaxation (grade 1 diastolic dysfunction). - Aortic valve: Trileaflet; moderately calcified leaflets. There was no stenosis. There was trivial regurgitation. Mean gradient (S): 9 mm Hg. - Mitral valve: Mildly to moderately calcified annulus. There was mild to moderate regurgitation. - Left atrium: The atrium was moderately to severely dilated. - Right ventricle: The cavity size was normal. Systolic function was normal. - Tricuspid valve: There was mild regurgitation. Peak RV-RA gradient (S): 35 mm Hg. - Pulmonary arteries: PA peak pressure: 38 mm Hg (S). - Inferior vena cava: The vessel was normal in size. The respirophasic diameter changes were in the normal range (>= 50%), consistent with normal central venous pressure. - Impressions:  Moderately  dilated LV with EF 20%, diffuse hypokinesis. Mild to moderate mitral regurgitation. Normal RV size and systolicfunction. Severe left atrial enlargement. Mild pulmonary hypertension.  Pt has not seen cardiology in over a year.  She did tolerate 1st stage basilic vein transposition 03/05/17. Pt will need further assessment day of surgery by assigned anesthesiologist.  If no acute CV sx, and labs and EKG acceptable day of surgery, I anticipate pt can proceed as scheduled.   Willeen Cass, FNP-BC [facility]/Anesthesiology Phone: [phone] 05/27/2017 2:38 PM

## 2017-05-28 ENCOUNTER — Ambulatory Visit (HOSPITAL_COMMUNITY): Payer: Medicare HMO | Admitting: Anesthesiology

## 2017-05-28 ENCOUNTER — Encounter (HOSPITAL_COMMUNITY): Payer: Self-pay | Admitting: *Deleted

## 2017-05-28 ENCOUNTER — Ambulatory Visit (HOSPITAL_COMMUNITY)
Admission: RE | Admit: 2017-05-28 | Discharge: 2017-05-28 | Disposition: A | Discharge status: Home / Self Care | Payer: Medicare HMO | Source: Ambulatory Visit | Attending: Surgery | Admitting: Surgery

## 2017-05-28 ENCOUNTER — Encounter (HOSPITAL_COMMUNITY)
Admission: RE | Disposition: A | Discharge status: Home / Self Care | Payer: Self-pay | Source: Ambulatory Visit | Attending: Surgery

## 2017-05-28 DIAGNOSIS — G4733 Obstructive sleep apnea (adult) (pediatric): Secondary | ICD-10-CM | POA: Insufficient documentation

## 2017-05-28 DIAGNOSIS — K219 Gastro-esophageal reflux disease without esophagitis: Secondary | ICD-10-CM | POA: Diagnosis not present

## 2017-05-28 DIAGNOSIS — M1990 Unspecified osteoarthritis, unspecified site: Secondary | ICD-10-CM | POA: Diagnosis not present

## 2017-05-28 DIAGNOSIS — I428 Other cardiomyopathies: Secondary | ICD-10-CM | POA: Insufficient documentation

## 2017-05-28 DIAGNOSIS — E1151 Type 2 diabetes mellitus with diabetic peripheral angiopathy without gangrene: Secondary | ICD-10-CM | POA: Diagnosis not present

## 2017-05-28 DIAGNOSIS — I132 Hypertensive heart and chronic kidney disease with heart failure and with stage 5 chronic kidney disease, or end stage renal disease: Secondary | ICD-10-CM | POA: Diagnosis present

## 2017-05-28 DIAGNOSIS — N186 End stage renal disease: Secondary | ICD-10-CM | POA: Insufficient documentation

## 2017-05-28 DIAGNOSIS — J449 Chronic obstructive pulmonary disease, unspecified: Secondary | ICD-10-CM | POA: Diagnosis not present

## 2017-05-28 DIAGNOSIS — Z7984 Long term (current) use of oral hypoglycemic drugs: Secondary | ICD-10-CM | POA: Insufficient documentation

## 2017-05-28 DIAGNOSIS — E039 Hypothyroidism, unspecified: Secondary | ICD-10-CM | POA: Insufficient documentation

## 2017-05-28 DIAGNOSIS — N185 Chronic kidney disease, stage 5: Secondary | ICD-10-CM | POA: Diagnosis not present

## 2017-05-28 DIAGNOSIS — E1122 Type 2 diabetes mellitus with diabetic chronic kidney disease: Secondary | ICD-10-CM | POA: Insufficient documentation

## 2017-05-28 DIAGNOSIS — D631 Anemia in chronic kidney disease: Secondary | ICD-10-CM | POA: Insufficient documentation

## 2017-05-28 DIAGNOSIS — Z87891 Personal history of nicotine dependence: Secondary | ICD-10-CM | POA: Diagnosis not present

## 2017-05-28 DIAGNOSIS — Z7901 Long term (current) use of anticoagulants: Secondary | ICD-10-CM | POA: Insufficient documentation

## 2017-05-28 DIAGNOSIS — I509 Heart failure, unspecified: Secondary | ICD-10-CM | POA: Diagnosis not present

## 2017-05-28 HISTORY — PX: BASCILIC VEIN TRANSPOSITION: SHX5742

## 2017-05-28 LAB — GLUCOSE, CAPILLARY
Glucose-Capillary: 200 mg/dL — ABNORMAL HIGH (ref 65–99)
Glucose-Capillary: 184 mg/dL — ABNORMAL HIGH (ref 65–99)

## 2017-05-28 LAB — POCT I-STAT 4, (NA,K, GLUC, HGB,HCT)
GLUCOSE: 196 mg/dL — AB (ref 65–99)
HEMATOCRIT: 25 % — AB (ref 36.0–46.0)
Hemoglobin: 8.5 g/dL — ABNORMAL LOW (ref 12.0–15.0)
Potassium: 4.2 mmol/L (ref 3.5–5.1)
Sodium: 143 mmol/L (ref 135–145)

## 2017-05-28 LAB — HEMOGLOBIN A1C
Hgb A1c MFr Bld: 6.9 % — ABNORMAL HIGH (ref 4.8–5.6)
Mean Plasma Glucose: 151.33 mg/dL

## 2017-05-28 LAB — PROTIME-INR
INR: 1.67
Prothrombin Time: 19.6 seconds — ABNORMAL HIGH (ref 11.4–15.2)

## 2017-05-28 SURGERY — TRANSPOSITION, VEIN, BASILIC
Anesthesia: Monitor Anesthesia Care | Laterality: Right

## 2017-05-28 MED ORDER — MIDAZOLAM HCL 5 MG/5ML IJ SOLN
INTRAMUSCULAR | Status: DC | PRN
  Administered 2017-05-28 (×2): 0.5 mg via INTRAVENOUS

## 2017-05-28 MED ORDER — SODIUM CHLORIDE 0.9 % IV SOLN
INTRAVENOUS | Status: DC | PRN
  Administered 2017-05-28: 08:00:00 500 mL

## 2017-05-28 MED ORDER — LIDOCAINE-EPINEPHRINE (PF) 1 %-1:200000 IJ SOLN
INTRAMUSCULAR | Status: AC
  Filled 2017-05-28: qty 30

## 2017-05-28 MED ORDER — FENTANYL CITRATE (PF) 100 MCG/2ML IJ SOLN
INTRAMUSCULAR | Status: AC
  Filled 2017-05-28: qty 2

## 2017-05-28 MED ORDER — 0.9 % SODIUM CHLORIDE (POUR BTL) OPTIME
TOPICAL | Status: DC | PRN
Start: 2017-05-28 — End: 2017-05-28
  Administered 2017-05-28: 1000 mL

## 2017-05-28 MED ORDER — SODIUM CHLORIDE 0.9 % IV SOLN: INTRAVENOUS | Status: DC

## 2017-05-28 MED ORDER — PHENYLEPHRINE 40 MCG/ML (10ML) SYRINGE FOR IV PUSH (FOR BLOOD PRESSURE SUPPORT)
PREFILLED_SYRINGE | INTRAVENOUS | Status: DC | PRN
  Administered 2017-05-28: 80 ug via INTRAVENOUS

## 2017-05-28 MED ORDER — PROPOFOL 10 MG/ML IV BOLUS
INTRAVENOUS | Status: AC
  Filled 2017-05-28: qty 20

## 2017-05-28 MED ORDER — OXYCODONE HCL 5 MG PO TABS
ORAL_TABLET | ORAL | Status: AC
  Filled 2017-05-28: qty 1

## 2017-05-28 MED ORDER — DEXAMETHASONE SODIUM PHOSPHATE 10 MG/ML IJ SOLN
INTRAMUSCULAR | Status: DC | PRN
  Administered 2017-05-28: 5 mg via INTRAVENOUS

## 2017-05-28 MED ORDER — FENTANYL CITRATE (PF) 250 MCG/5ML IJ SOLN
INTRAMUSCULAR | Status: AC
Start: 2017-05-28 — End: ?
  Filled 2017-05-28: qty 5

## 2017-05-28 MED ORDER — ONDANSETRON HCL 4 MG/2ML IJ SOLN
INTRAMUSCULAR | Status: DC | PRN
  Administered 2017-05-28: 4 mg via INTRAVENOUS

## 2017-05-28 MED ORDER — LIDOCAINE-EPINEPHRINE (PF) 1 %-1:200000 IJ SOLN
INTRAMUSCULAR | Status: DC | PRN
  Administered 2017-05-28: 30 mL
  Administered 2017-05-28: 12 mL

## 2017-05-28 MED ORDER — OXYCODONE HCL 5 MG/5ML PO SOLN: 5.0000 mg | Freq: Once | ORAL | Status: AC | PRN

## 2017-05-28 MED ORDER — PROPOFOL 10 MG/ML IV BOLUS
INTRAVENOUS | Status: DC | PRN
  Administered 2017-05-28 (×7): 10 mg via INTRAVENOUS

## 2017-05-28 MED ORDER — PHENYLEPHRINE HCL 10 MG/ML IJ SOLN
INTRAMUSCULAR | Status: DC | PRN
  Administered 2017-05-28: 25 ug/min via INTRAVENOUS

## 2017-05-28 MED ORDER — PROPOFOL 500 MG/50ML IV EMUL
INTRAVENOUS | Status: DC | PRN
  Administered 2017-05-28: 25 ug/kg/min via INTRAVENOUS

## 2017-05-28 MED ORDER — OXYCODONE HCL 5 MG PO TABS
5.0000 mg | ORAL_TABLET | Freq: Once | ORAL | Status: AC | PRN
  Administered 2017-05-28: 5 mg via ORAL

## 2017-05-28 MED ORDER — OXYCODONE-ACETAMINOPHEN 5-325 MG PO TABS: 1.0000 | ORAL_TABLET | Freq: Four times a day (QID) | ORAL | 0 refills | Status: DC | PRN

## 2017-05-28 MED ORDER — FENTANYL CITRATE (PF) 100 MCG/2ML IJ SOLN
INTRAMUSCULAR | Status: DC | PRN
  Administered 2017-05-28 (×6): 25 ug via INTRAVENOUS

## 2017-05-28 MED ORDER — MIDAZOLAM HCL 2 MG/2ML IJ SOLN
INTRAMUSCULAR | Status: AC
  Filled 2017-05-28: qty 2

## 2017-05-28 MED ORDER — SODIUM CHLORIDE 0.9 % IV SOLN
INTRAVENOUS | Status: DC | PRN
  Administered 2017-05-28 (×2): via INTRAVENOUS

## 2017-05-28 MED ORDER — FENTANYL CITRATE (PF) 100 MCG/2ML IJ SOLN
25.0000 ug | INTRAMUSCULAR | Status: DC | PRN
  Administered 2017-05-28: 25 ug via INTRAVENOUS

## 2017-05-28 MED ORDER — DEXTROSE 5 % IV SOLN
1.5000 g | INTRAVENOUS | Status: AC
  Administered 2017-05-28: 1.5 g via INTRAVENOUS
  Filled 2017-05-28: qty 1.5

## 2017-05-28 SURGICAL SUPPLY — 41 items
ARMBAND PINK RESTRICT EXTREMIT (MISCELLANEOUS) ×3 IMPLANT
CANISTER SUCT 3000ML PPV (MISCELLANEOUS) ×3 IMPLANT
CLIP VESOCCLUDE MED 24/CT (CLIP) IMPLANT
CLIP VESOCCLUDE MED 6/CT (CLIP) IMPLANT
CLIP VESOCCLUDE SM WIDE 24/CT (CLIP) IMPLANT
CLIP VESOCCLUDE SM WIDE 6/CT (CLIP) IMPLANT
COVER PROBE W GEL 5X96 (DRAPES) ×3 IMPLANT
DECANTER SPIKE VIAL GLASS SM (MISCELLANEOUS) ×6 IMPLANT
DERMABOND ADVANCED (GAUZE/BANDAGES/DRESSINGS) ×2
DERMABOND ADVANCED .7 DNX12 (GAUZE/BANDAGES/DRESSINGS) ×1 IMPLANT
ELECT REM PT RETURN 9FT ADLT (ELECTROSURGICAL) ×3
ELECTRODE REM PT RTRN 9FT ADLT (ELECTROSURGICAL) ×1 IMPLANT
GLOVE BIO SURGEON STRL SZ7.5 (GLOVE) ×6 IMPLANT
GLOVE BIOGEL PI IND STRL 6.5 (GLOVE) ×2 IMPLANT
GLOVE BIOGEL PI IND STRL 7.5 (GLOVE) ×2 IMPLANT
GLOVE BIOGEL PI INDICATOR 6.5 (GLOVE) ×4
GLOVE BIOGEL PI INDICATOR 7.5 (GLOVE) ×4
GLOVE ECLIPSE 7.0 STRL STRAW (GLOVE) ×3 IMPLANT
GLOVE SURG SS PI 7.5 STRL IVOR (GLOVE) ×3 IMPLANT
GOWN STRL REUS W/ TWL LRG LVL3 (GOWN DISPOSABLE) ×2 IMPLANT
GOWN STRL REUS W/ TWL XL LVL3 (GOWN DISPOSABLE) ×2 IMPLANT
GOWN STRL REUS W/TWL LRG LVL3 (GOWN DISPOSABLE) ×4
GOWN STRL REUS W/TWL XL LVL3 (GOWN DISPOSABLE) ×6
HEMOSTAT SNOW SURGICEL 2X4 (HEMOSTASIS) IMPLANT
KIT BASIN OR (CUSTOM PROCEDURE TRAY) ×3 IMPLANT
KIT ROOM TURNOVER OR (KITS) ×3 IMPLANT
NS IRRIG 1000ML POUR BTL (IV SOLUTION) ×3 IMPLANT
PACK CV ACCESS (CUSTOM PROCEDURE TRAY) ×3 IMPLANT
PAD ARMBOARD 7.5X6 YLW CONV (MISCELLANEOUS) ×6 IMPLANT
SPONGE LAP 18X18 X RAY DECT (DISPOSABLE) ×3 IMPLANT
SUT PROLENE 5 0 C 1 24 (SUTURE) ×3 IMPLANT
SUT PROLENE 6 0 CC (SUTURE) ×3 IMPLANT
SUT SILK 2 0 SH (SUTURE) IMPLANT
SUT SILK 3 0 (SUTURE) ×3
SUT SILK 3-0 18XBRD TIE 12 (SUTURE) ×1 IMPLANT
SUT VIC AB 3-0 SH 27 (SUTURE) ×6
SUT VIC AB 3-0 SH 27X BRD (SUTURE) ×2 IMPLANT
SUT VICRYL 4-0 PS2 18IN ABS (SUTURE) ×6 IMPLANT
TOWEL GREEN STERILE (TOWEL DISPOSABLE) ×3 IMPLANT
UNDERPAD 30X30 (UNDERPADS AND DIAPERS) ×3 IMPLANT
WATER STERILE IRR 1000ML POUR (IV SOLUTION) ×3 IMPLANT

## 2017-05-28 NOTE — Interval H&P Note (Signed)
History and Physical Interval Note:  05/28/2017 7:21 AM  [name]  has presented today for surgery, with the diagnosis of CHRONIC KIDNEY DISEASE STAGE IV  The various methods of treatment have been discussed with the patient and family. After consideration of risks, benefits and other options for treatment, the patient has consented to  Procedure(s): BASILIC VEIN TRANSPOSITION SECOND STAGE RIGHT (Right) as a surgical intervention .  The patient's history has been reviewed, patient examined, no change in status, stable for surgery.  I have reviewed the patient's chart and labs.  Questions were answered to the patient's satisfaction.     Annamarie Major

## 2017-05-28 NOTE — Anesthesia Preprocedure Evaluation (Signed)
Anesthesia Evaluation  Patient identified by MRN, date of birth, ID band Patient awake    Reviewed: Allergy & Precautions, NPO status , Patient's Chart, lab work & pertinent test results, reviewed documented beta blocker date and time   History of Anesthesia Complications Negative for: history of anesthetic complications  Airway Mallampati: II  TM Distance: >3 FB Neck ROM: Full    Dental  (+) Edentulous Upper, Edentulous Lower   Pulmonary sleep apnea , COPD, former smoker,    breath sounds clear to auscultation       Cardiovascular hypertension, Pt. on medications and Pt. on home beta blockers + Peripheral Vascular Disease and +CHF  + dysrhythmias Atrial Fibrillation (-) pacemaker(-) Cardiac Defibrillator  Rhythm:Regular + Systolic murmurs    Neuro/Psych negative neurological ROS  negative psych ROS   GI/Hepatic GERD  Medicated and Controlled,  Endo/Other  diabetesHypothyroidism   Renal/GU CRFRenal disease     Musculoskeletal  (+) Arthritis ,   Abdominal   Peds  Hematology  (+) anemia ,   Anesthesia Other Findings Pt is a 71 year old female scheduled for R 2nd stage basilic vein transposition on 05/26/2017 with Harold Barban, MD  - PCP is Leslie Andrea, MD - cardiologist is Loralie Champagne, MD; last office visit 04/21/16.  PMH includes:Nonischemic cardiomyopathy (dx 1999; 2 priorICDs, bothexplanted for bacteremia with vegetation on TEE; last ICD explanted2016; no plans for re-insertion ICD), chronic systolic HF, LBBB, PAF, HTN, DM, hyperlipidemia, CKD (stage IV), OSA, anemia, PE (2007),GERD. Former smoker. S/p 1st stage basilic vein transposition 03/05/17.  Medications include: amiodarone, carvedilol, iron, lasix, glimepiride, imdur, levothyroxine, lisinopril, pravastatin, spironolactone, coumadin. Last dose coumadin 05/22/17. Lovenox bridge started 05/25/17.   Labs will be obtained day of surgery.    EKG 03/05/17: Sinus rhythm with Fusion complexes. LAD. LBBB.   Echo 03/06/16: - Left ventricle: The cavity size was moderately dilated. Wall thickness was normal. The estimated ejection fraction was 20%.Diffuse hypokinesis. Doppler parameters are consistent with abnormal left ventricular relaxation (grade 1 diastolic dysfunction). - Aortic valve: Trileaflet; moderately calcified leaflets. There was no stenosis. There was trivial regurgitation. Mean gradient (S): 9 mm Hg. - Mitral valve: Mildly to moderately calcified annulus. There was mild to moderate regurgitation. - Left atrium: The atrium was moderately to severely dilated. - Right ventricle: The cavity size was normal. Systolic function was normal. - Tricuspid valve: There was mild regurgitation. Peak RV-RA gradient (S): 35 mm Hg. - Pulmonary arteries: PA peak pressure: 38 mm Hg (S). - Inferior vena cava: The vessel was normal in size. The respirophasic diameter changes were in the normal range (>= 50%), consistent with normal central venous pressure. -Impressions: Moderately dilated LV with EF 20%, diffuse hypokinesis. Mild to moderate mitral regurgitation. Normal RV size and systolicfunction. Severe left atrial enlargement. Mild pulmonary hypertension.  Reproductive/Obstetrics                             Anesthesia Physical Anesthesia Plan  ASA: IV  Anesthesia Plan: MAC   Post-op Pain Management:    Induction:   PONV Risk Score and Plan: 2 and Ondansetron and Treatment may vary due to age or medical condition  Airway Management Planned: Nasal Cannula  Additional Equipment: None  Intra-op Plan:   Post-operative Plan:   Informed Consent: I have reviewed the patients History and Physical, chart, labs and discussed the procedure including the risks, benefits and alternatives for the proposed anesthesia with the patient or authorized  representative who has indicated his/her understanding and  acceptance.   Dental advisory given  Plan Discussed with: CRNA and Surgeon  Anesthesia Plan Comments:         Anesthesia Quick Evaluation

## 2017-05-28 NOTE — Anesthesia Postprocedure Evaluation (Signed)
Anesthesia Post Note  Patient: [name]  Procedure(s) Performed: BASILIC VEIN TRANSPOSITION SECOND STAGE RIGHT (Right )     Patient location during evaluation: PACU Anesthesia Type: MAC Level of consciousness: awake and alert Pain management: pain level controlled Vital Signs Assessment: post-procedure vital signs reviewed and stable Respiratory status: spontaneous breathing, nonlabored ventilation, respiratory function stable and patient connected to nasal cannula oxygen Cardiovascular status: stable and blood pressure returned to baseline Postop Assessment: no apparent nausea or vomiting Anesthetic complications: no    Last Vitals:  Vitals:   05/28/17 1043 05/28/17 1100  BP:  120/66  Pulse: 68 69  Resp: 12 15  Temp:    SpO2: 95% 95%    Last Pain:  Vitals:   05/28/17 1043  TempSrc:   PainSc: 4                  Tymeer Vaquera

## 2017-05-28 NOTE — Transfer of Care (Signed)
Immediate Anesthesia Transfer of Care Note  Patient: [name]  Procedure(s) Performed: BASILIC VEIN TRANSPOSITION SECOND STAGE RIGHT (Right )  Patient Location: PACU  Anesthesia Type:MAC  Level of Consciousness: awake, alert , oriented and sedated  Airway & Oxygen Therapy: Patient Spontanous Breathing and Patient connected to nasal cannula oxygen  Post-op Assessment: Report given to RN, Post -op Vital signs reviewed and stable and Patient moving all extremities  Post vital signs: stable  Last Vitals:  Vitals:   05/28/17 0613 05/28/17 0952  BP: (!) 124/52   Pulse: 81 69  Resp: 18 15  Temp: 36.9 C 36.7 C  SpO2: 95% 92%    Last Pain:  Vitals:   05/28/17 0613  TempSrc: Oral      Patients Stated Pain Goal: 2 (97/02/63 7858)  Complications: No apparent anesthesia complications

## 2017-05-29 ENCOUNTER — Telehealth: Payer: Self-pay | Admitting: Surgery

## 2017-05-29 ENCOUNTER — Encounter (HOSPITAL_COMMUNITY): Payer: Self-pay | Admitting: Surgery

## 2017-05-29 NOTE — Telephone Encounter (Signed)
-----   Message from Mena Goes, RN sent at 05/28/2017 11:56 AM EST ----- Regarding: 4 weeks postop   ----- Message ----- From: Ulyses Amor, PA-C Sent: 05/28/2017   9:47 AM To: Vvs Charge Pool  Second stage basilic transposition F/U with Brabham in 4 weeks

## 2017-05-29 NOTE — Telephone Encounter (Signed)
Sched appt 06/29/17 at 11:15. Spoke to pt.

## 2017-06-01 ENCOUNTER — Other Ambulatory Visit (HOSPITAL_COMMUNITY): Payer: Medicare HMO

## 2017-06-01 ENCOUNTER — Ambulatory Visit (HOSPITAL_COMMUNITY): Payer: Medicare HMO

## 2017-06-01 NOTE — Op Note (Signed)
    Patient name: [name] MRN: [id number] DOB: Dec 29, 1945 Sex: female  05/28/2017 Pre-operative Diagnosis: ESRD Post-operative diagnosis:  Same Surgeon:  Annamarie Major Assistants:  Lennie Muckle Procedure:   2nd stage right basilic vein transposition Anesthesia:  MAC Blood Loss:  See anesthesia record Specimens:  none  Findings:  Excellent vein  Indications: The patient is here today for second stage basilic vein fistula creation  Procedure:  The patient was identified in the holding area and taken to [place] [address]  The patient was then placed supine on the table. MAC anesthesia was administered.  The patient was prepped and draped in the usual sterile fashion.  A time out was called and antibiotics were administered.  1% lidocaine was used for local anesthesia.  I made 2 longitudinal incisions in the upper arm.  Through these incisions I circumferentially dissected out the basilic vein.  The associated nerve was protected throughout the procedure.  Side branches were ligated between silk ties.  The vein was of excellent caliber measuring approximately 6 mm throughout.  Once the vein was fully mobilized, I created a subcutaneous tunnel with a curved tunneler.  I then occluded the fistula at the antecubital crease and at the axilla.  I marked it for orientation.  I transected the vein near the antecubital crease.  The vein was then brought through the previously created tunnel making sure to maintain proper orientation.  I then performed a end to end anastomosis with running 5-0 Prolene.  Prior to completion the appropriate flushing maneuvers were performed and the anastomosis was completed.  There was an excellent thrill within the fistula once the clamps were released.  She had a palpable radial pulse.  Hemostasis was then achieved.  I then reapproximated the subcutaneous tissue in both incisions with 3-0 Vicryl.  The skin was closed with 4-0 Vicryl followed by Dermabond.  There were no immediate  complications.   Disposition: To PACU stable   V. Annamarie Major, M.D. [facility] [place] Office: [phone] Pager:  [phone]

## 2017-06-04 ENCOUNTER — Other Ambulatory Visit (HOSPITAL_COMMUNITY): Payer: Medicare HMO

## 2017-06-04 ENCOUNTER — Encounter (HOSPITAL_COMMUNITY): Payer: Medicare HMO | Attending: Oncology

## 2017-06-04 ENCOUNTER — Ambulatory Visit (HOSPITAL_COMMUNITY): Payer: Medicare HMO | Admitting: Oncology

## 2017-06-04 DIAGNOSIS — D631 Anemia in chronic kidney disease: Secondary | ICD-10-CM | POA: Insufficient documentation

## 2017-06-04 DIAGNOSIS — N189 Chronic kidney disease, unspecified: Secondary | ICD-10-CM | POA: Insufficient documentation

## 2017-06-08 ENCOUNTER — Other Ambulatory Visit: Payer: Self-pay

## 2017-06-08 ENCOUNTER — Encounter (HOSPITAL_COMMUNITY): Payer: Self-pay

## 2017-06-08 ENCOUNTER — Encounter (HOSPITAL_BASED_OUTPATIENT_CLINIC_OR_DEPARTMENT_OTHER): Payer: Medicare HMO | Admitting: Oncology

## 2017-06-08 ENCOUNTER — Encounter (HOSPITAL_COMMUNITY): Payer: Medicare HMO

## 2017-06-08 ENCOUNTER — Encounter (HOSPITAL_BASED_OUTPATIENT_CLINIC_OR_DEPARTMENT_OTHER): Payer: Medicare HMO

## 2017-06-08 VITALS — BP 110/49 | HR 70 | Temp 98.4°F | Resp 20 | Ht 65.0 in | Wt 190.5 lb

## 2017-06-08 DIAGNOSIS — D500 Iron deficiency anemia secondary to blood loss (chronic): Secondary | ICD-10-CM

## 2017-06-08 DIAGNOSIS — N184 Chronic kidney disease, stage 4 (severe): Secondary | ICD-10-CM

## 2017-06-08 DIAGNOSIS — D631 Anemia in chronic kidney disease: Secondary | ICD-10-CM

## 2017-06-08 DIAGNOSIS — Z992 Dependence on renal dialysis: Secondary | ICD-10-CM | POA: Diagnosis not present

## 2017-06-08 DIAGNOSIS — R5383 Other fatigue: Secondary | ICD-10-CM | POA: Diagnosis not present

## 2017-06-08 DIAGNOSIS — N189 Chronic kidney disease, unspecified: Secondary | ICD-10-CM | POA: Diagnosis not present

## 2017-06-08 DIAGNOSIS — N186 End stage renal disease: Secondary | ICD-10-CM

## 2017-06-08 LAB — CBC WITH DIFFERENTIAL/PLATELET
BASOS ABS: 0 10*3/uL (ref 0.0–0.1)
Basophils Relative: 0 %
Eosinophils Absolute: 0.1 10*3/uL (ref 0.0–0.7)
Eosinophils Relative: 1 %
HEMATOCRIT: 29.1 % — AB (ref 36.0–46.0)
Hemoglobin: 9.2 g/dL — ABNORMAL LOW (ref 12.0–15.0)
LYMPHS ABS: 1.5 10*3/uL (ref 0.7–4.0)
LYMPHS PCT: 19 %
MCH: 31.5 pg (ref 26.0–34.0)
MCHC: 31.6 g/dL (ref 30.0–36.0)
MCV: 99.7 fL (ref 78.0–100.0)
MONO ABS: 0.5 10*3/uL (ref 0.1–1.0)
Monocytes Relative: 6 %
NEUTROS ABS: 5.7 10*3/uL (ref 1.7–7.7)
Neutrophils Relative %: 74 %
Platelets: 175 10*3/uL (ref 150–400)
RBC: 2.92 MIL/uL — AB (ref 3.87–5.11)
RDW: 14 % (ref 11.5–15.5)
WBC: 7.7 10*3/uL (ref 4.0–10.5)

## 2017-06-08 MED ORDER — DARBEPOETIN ALFA 150 MCG/0.3ML IJ SOSY
150.0000 ug | PREFILLED_SYRINGE | Freq: Once | INTRAMUSCULAR | Status: AC
  Administered 2017-06-08: 150 ug via SUBCUTANEOUS

## 2017-06-08 MED ORDER — DARBEPOETIN ALFA 150 MCG/0.3ML IJ SOSY
PREFILLED_SYRINGE | INTRAMUSCULAR | Status: AC
  Filled 2017-06-08: qty 0.3

## 2017-06-08 NOTE — Progress Notes (Signed)
[name] presents today for injection per MD orders. Aranesp 150 mcg administered SQ in right lower abdomen. Administration without incident. Patient tolerated well. Patient discharged ambulatory with walker in stable condition with spouse Follow up as scheduled.

## 2017-06-08 NOTE — Patient Instructions (Signed)
[facility] Discharge Instructions  RECOMMENDATIONS MADE BY THE CONSULTANT AND ANY TEST RESULTS WILL BE SENT TO YOUR REFERRING PHYSICIAN.  You had your Aranesp injection today Follow up as scheduled.  Thank you for choosing  Can[facility] Center at [facility] to provide your oncology and hematology care.  To afford each patient quality time with our provider, please arrive at least 15 minutes before your scheduled appointment time.    If you have a lab appointment with the Cancer Center please come in thru the  Main Entrance and check in at the main information desk  You need to re-schedule your appointment should you arrive 10 or more minutes late.  We strive to give you quality time with our providers, and arriving late affects you and other patients whose appointments are after yours.  Also, if you no show three or more times for appointments you may be dismissed from the clinic at the providers discretion.     Again, thank you for choosing [facility].  Our hope is that these requests will decrease the amount of time that you wait before being seen by our physicians.       _____________________________________________________________  Should you have questions after your visit to [facility], please contact our office at [phone] between the hours of 8:30 a.m. and 4:30 p.m.  Voicemails left after 4:30 p.m. will not be returned until the following business day.  For prescription refill requests, have your pharmacy contact our office.       Resources For Cancer Patients and their Caregivers ? American Cancer Society: Can assist with transportation, wigs, general needs, runs Look Good Feel Better.        1-888-227-6333 ? Cancer Care: Provides financial assistance, online support groups, medication/co-pay assistance.  1-800-813-HOPE (4673) ? [facility] Assists [place] cancer  patients and their families through emotional , educational and financial support.  336-427-4357 ? [place] DSS Where to apply for food stamps, Medicaid and utility assistance. 336-342-1394 ? [facility]: Transportation to medical appointments. 336-347-2287 ? Social Security Administration: May apply for disability if have a Stage IV cancer. 336-342-7796 1-800-772-1213 ? [place] Aging, Disability and Transit Services: Assists with nutrition, care and transit needs. 336-349-2343  Cancer Center Support Programs: @10RELATIVEDAYS@ > Cancer Support Group  2nd Tuesday of the month 1pm-2pm, Journey Room  > Creative Journey  3rd Tuesday of the month 1130am-1pm, Journey Room  > Look Good Feel Better  1st Wednesday of the month 10am-12 noon, Journey Room (Call American Cancer Society to register 1-800-395-5775)    

## 2017-06-08 NOTE — Progress Notes (Signed)
Lemmie Evens, MD [place] [id number]  No diagnosis found.  CURRENT THERAPY: Aranesp 60 mcg every 4 weeks  INTERVAL HISTORY: [name] 71 y.o. female returns for followup of anemia of chronic renal disease, Stage IV, on ESA therapy.  Patient presents today for continue follow-up.  She states she has been doing relatively well however complains of fatigue.  She denies any chest pain, shortness of breath, abdominal pain, nausea, vomiting, diarrhea, focal weakness.  She bruises easily but denies any major bleeding.  Review of Systems  Constitutional: Positive for malaise/fatigue. Negative for chills, fever and weight loss.  HENT: Negative.   Eyes: Negative.   Respiratory: Negative.  Negative for cough.   Cardiovascular: Negative.  Negative for chest pain.  Gastrointestinal: Negative.  Negative for blood in stool, constipation, diarrhea, melena, nausea and vomiting.  Genitourinary: Negative.   Musculoskeletal: Negative.   Skin: Negative.   Neurological: Negative.  Negative for weakness.  Endo/Heme/Allergies: Negative.   Psychiatric/Behavioral: Negative.     Past Medical History:  Diagnosis Date  . Anemia    a. mild/chronic  . Anemia due to GI blood loss 02/26/2016  . Anemia in chronic renal disease 11/30/2014  . Cardiomyopathy, nonischemic ([place])    a. 1999 nl cath;  b. 12/05 Guidant [place];  c. 10/2005 ICD extraction 2/2 enterococcus bacteremia and Veg on RV lead;  c. 05/2008 low risk Myoview (scarring w/ some evidence of inf ischemia);  d. 11/2012 Echo: EF 15-20%;  e. 01/2013 s/p MDT [place] CRT D, ser # [id number];  f. 05/2013 Echo: EF 15%.  . Chronic systolic CHF (congestive heart failure) ([place])    a. 11/2012 Echo: EF 15-20%;  b. 05/2013 TEE EF 15%.  . CKD (chronic kidney disease), stage III ([place])    creatinin-1.44 in 1/09; 1.51 in 1/10  . Degenerative joint disease    of knees, shoulder, and hips  . Enterococcal infection    a. 10/2005 -  AICD-explanted  . GERD (gastroesophageal reflux disease)   . Hilar density    a. infrahilar mass/adenopathy on CT scan 5/07; subsequently  resolved  . History of blood transfusion   . Hyperlipidemia   . Hypertension   . Hypothyroidism   . ICD (implantable cardioverter-defibrillator) infection ([place])    removed 2016  . Implantable cardioverter-defibrillator-CRT- Mdt    a.  01/2013 s/p MDT [place] [facility] CRT D, ser # [id number] - ICD has been removed  . LBBB (left bundle branch block)   . Obstructive sleep apnea    uses cpap  . PAF (paroxysmal atrial fibrillation) ([place])    a. 07/2012 s/p TEE/DCCV;  b. chronic coumadin;  c. 05/2013 Recurrent Afib->TEE/DCCV and amio initiation.  . Peripheral vascular disease ([place])   . Pneumonia 07/2005  . Pulmonary embolism ([place])    a. 07/2005 after total right hip arthroplasty  . Tobacco abuse    a. discontinued in 1997, and then resumed  . Type II diabetes mellitus ([place])    type 2  . Urinary incontinence   . Villous adenoma of colon    a. tubovillous adenomatous polyp with focal high grade dysplasia; presented with hematochezia - followed by Dr. Laural Golden.    Past Surgical History:  Procedure Laterality Date  . A-V CARDIAC PACEMAKER INSERTION  12/05   Biventricular pacemaker/AICD  . ABDOMINAL HYSTERECTOMY  1990/92   Initial partial hysterectomy followed by BSO  . BASCILIC VEIN TRANSPOSITION Right 03/05/2017   Procedure: RIGHT  1ST STAGE BASCILIC VEIN TRANSPOSITION;  Surgeon: Serafina Mitchell, MD;  Location: [place];  Service: Vascular;  Laterality: Right;  . BASCILIC VEIN TRANSPOSITION Right 05/28/2017   Procedure: BASILIC VEIN TRANSPOSITION SECOND STAGE RIGHT;  Surgeon: Serafina Mitchell, MD;  [facility];  Service: Vascular;  Laterality: Right;  . BI-VENTRICULAR IMPLANTABLE CARDIOVERTER DEFIBRILLATOR N/A 01/24/2013   Procedure: BI-VENTRICULAR IMPLANTABLE CARDIOVERTER DEFIBRILLATOR  (CRT-D);  Surgeon: Evans Lance, MD;  Location: [facility] CATH LAB;  Service:  Cardiovascular;  Laterality: N/A;  . BI-VENTRICULAR IMPLANTABLE CARDIOVERTER DEFIBRILLATOR  (CRT-D)  01/24/2013  . CARDIAC CATHETERIZATION    . CARDIOVERSION N/A 08/20/2012   Procedure: TEE GUIDED CARDIOVERSION;  Surgeon: Yehuda Savannah, MD;  Location: [facility];  Service: Cardiovascular;  Laterality: N/A;  To be done @ bedside  . CARDIOVERSION N/A 06/20/2013   Procedure: CARDIOVERSION;  Surgeon: Dorothy Spark, MD;  Location: [place];  Service: Cardiovascular;  Laterality: N/A;  . CATARACT EXTRACTION W/PHACO Left 08/07/2015   Procedure: CATARACT EXTRACTION PHACO AND INTRAOCULAR LENS PLACEMENT ([place]);  Surgeon: Rutherford Guys, MD;  Location: [facility];  Service: Ophthalmology;  Laterality: Left;  CDE: 7.88  . CATARACT EXTRACTION W/PHACO Right 08/21/2015   Procedure: CATARACT EXTRACTION PHACO AND INTRAOCULAR LENS PLACEMENT (IOC);  Surgeon: Rutherford Guys, MD;  Location: [facility];  Service: Ophthalmology;  Laterality: Right;  CDE:8.55  . COLONOSCOPY Left 10/24/2014   Procedure: COLONOSCOPY;  Surgeon: Carol Ada, MD;  Location: [facility] ENDOSCOPY;  Service: Endoscopy;  Laterality: Left;  . COLONOSCOPY W/ POLYPECTOMY  2009  . COLONOSCOPY WITH ESOPHAGOGASTRODUODENOSCOPY (EGD) N/A 06/10/2013   Procedure: COLONOSCOPY WITH ESOPHAGOGASTRODUODENOSCOPY (EGD);  Surgeon: Rogene Houston, MD;  [facility];  Service: Endoscopy;  Laterality: N/A;  925  . ESOPHAGOGASTRODUODENOSCOPY N/A 10/21/2014   Procedure: ESOPHAGOGASTRODUODENOSCOPY (EGD);  Surgeon: Inda Castle, MD;  Location: [place];  Service: Endoscopy;  Laterality: N/A;  . GIVENS CAPSULE STUDY N/A 10/24/2014   Procedure: GIVENS CAPSULE STUDY;  Surgeon: Carol Ada, MD;  Location: [place];  Service: Endoscopy;  Laterality: N/A;  . ICD LEAD REMOVAL N/A 12/13/2014   Procedure: ICD LEAD REMOVAL/EXTRACTION ;  Surgeon: Evans Lance, MD;  Location: [place];  Service: Cardiovascular;  Laterality: N/A;  [place] back up  . KNEE ARTHROSCOPY Right 1980's?    Marland Kitchen PACEMAKER REMOVAL  11/18/05   Enterococcal infection  . RIGHT HEART CATHETERIZATION N/A 04/18/2014   Procedure: RIGHT HEART CATH;  Surgeon: Larey Dresser, MD;  Location: [facility] CATH LAB;  Service: Cardiovascular;  Laterality: N/A;  . TEE WITHOUT CARDIOVERSION N/A 08/20/2012   Procedure: TRANSESOPHAGEAL ECHOCARDIOGRAM (TEE);  Surgeon: Yehuda Savannah, MD;  [facility];  Service: Cardiovascular;  Laterality: N/A;  . TEE WITHOUT CARDIOVERSION N/A 06/20/2013   Procedure: TRANSESOPHAGEAL ECHOCARDIOGRAM (TEE);  Surgeon: Dorothy Spark, MD;  Location: [place];  Service: Cardiovascular;  Laterality: N/A;  . TEE WITHOUT CARDIOVERSION N/A 12/08/2014   Procedure: TRANSESOPHAGEAL ECHOCARDIOGRAM (TEE);  Surgeon: Fay Records, MD;  [facility];  Service: Cardiovascular;  Laterality: N/A;  . TOTAL HIP ARTHROPLASTY Right 07/2005  . TUBAL LIGATION  1980's    Family History  Problem Relation Age of Onset  . Hypertension Mother   . Diabetes Mother   . Coronary artery disease Father   . Diabetes Brother   . Hypertension Brother   . Lung cancer Brother   . Arthritis Other   . Diabetes Other   . Heart disease Other  female < 33    Social History   Socioeconomic History  . Marital status: Married    Spouse name: None  . Number of children: None  . Years of education: None  . Highest education level: None  Social Needs  . Financial resource strain: None  . Food insecurity - worry: None  . Food insecurity - inability: None  . Transportation needs - medical: None  . Transportation needs - non-medical: None  Occupational History  . None  Tobacco Use  . Smoking status: Former Smoker    Years: 12.00    Types: Cigarettes  . Smokeless tobacco: Never Used  . Tobacco comment: 05/2013: Smokes an occasional cigarette.  Says that she doesn't inhale.  Substance and Sexual Activity  . Alcohol use: No    Alcohol/week: 0.0 oz  . Drug use: No  . Sexual activity: Not  Currently    Birth control/protection: None  Other Topics Concern  . None  Social History Narrative   Married, lives in [place] with spouse. Retired Secretary/administrator.      PHYSICAL EXAMINATION  ECOG PERFORMANCE STATUS: 1 - Symptomatic but completely ambulatory  Vitals:   06/08/17 1445  BP: (!) 110/49  Pulse: 70  Resp: 20  Temp: 98.4 F (36.9 C)  SpO2: 97%     Constitutional: Well-developed, well-nourished, and in no distress.   HENT:  Head: Normocephalic and atraumatic.  Mouth/Throat: No oropharyngeal exudate. Mucosa moist. Eyes: Pupils are equal, round, and reactive to light. Conjunctivae are normal. No scleral icterus.  Neck: Normal range of motion. Neck supple. No JVD present.  Cardiovascular: Normal rate, regular rhythm and normal heart sounds.  Exam reveals no gallop and no friction rub.   No murmur heard. Pulmonary/Chest: Effort normal and breath sounds normal. No respiratory distress. No wheezes.No rales.  Abdominal: Soft. Bowel sounds are normal. No distension. There is no tenderness. There is no guarding.  Musculoskeletal: No edema or tenderness.  Lymphadenopathy:    No cervical or supraclavicular adenopathy.  Neurological: Alert and oriented to person, place, and time. No cranial nerve deficit.  Skin: Skin is warm and dry. No rash noted. No erythema. No pallor.  Psychiatric: Affect and judgment normal.    LABORATORY DATA: CBC    Component Value Date/Time   WBC 7.7 06/08/2017 1404   RBC 2.92 (L) 06/08/2017 1404   HGB 9.2 (L) 06/08/2017 1404   HCT 29.1 (L) 06/08/2017 1404   PLT 175 06/08/2017 1404   MCV 99.7 06/08/2017 1404   MCH 31.5 06/08/2017 1404   MCHC 31.6 06/08/2017 1404   RDW 14.0 06/08/2017 1404   LYMPHSABS 1.5 06/08/2017 1404   MONOABS 0.5 06/08/2017 1404   EOSABS 0.1 06/08/2017 1404   BASOSABS 0.0 06/08/2017 1404      Chemistry      Component Value Date/Time   NA 143 05/28/2017 0639   K 4.2 05/28/2017 0639   CL 102 02/06/2017 0837    CO2 24 02/06/2017 0837   BUN 51 (H) 02/06/2017 0837   CREATININE 2.95 (H) 02/06/2017 0837   CREATININE 1.17 (H) 01/20/2013 1010      Component Value Date/Time   CALCIUM 9.9 02/06/2017 0837   ALKPHOS 66 04/28/2016 1250   AST 14 (L) 04/28/2016 1250   ALT 10 (L) 04/28/2016 1250   BILITOT 0.9 04/28/2016 1250     Lab Results  Component Value Date   IRON 84 02/06/2017   TIBC 244 (L) 02/06/2017   FERRITIN 230 02/06/2017  ASSESSMENT AND PLAN:  Anemia of chronic renal disease, stage IV, on ESA therapy with history of iron deficiency.  PLAN: -In the last few months patient's hemoglobin has been running in the 9 g/dL range.  Therefore we will plan to increase her Aranesp to 150 mcg monthly.  She will get a dose of Aranesp today. -RTC in 3 months for follow up with labs.    THERAPY PLAN:  Continue with ESA support and ongoing surveillance of iron studies.  Will need to keep her ferritin above 100.   This note is electronically signed by: Twana First, MD 06/08/2017 2:47 PM

## 2017-06-23 DIAGNOSIS — N390 Urinary tract infection, site not specified: Secondary | ICD-10-CM

## 2017-06-23 DIAGNOSIS — B9620 Unspecified Escherichia coli [E. coli] as the cause of diseases classified elsewhere: Secondary | ICD-10-CM

## 2017-06-23 HISTORY — DX: Unspecified Escherichia coli (E. coli) as the cause of diseases classified elsewhere: B96.20

## 2017-06-23 HISTORY — DX: Urinary tract infection, site not specified: N39.0

## 2017-06-26 ENCOUNTER — Other Ambulatory Visit (HOSPITAL_COMMUNITY): Payer: Self-pay

## 2017-06-26 ENCOUNTER — Inpatient Hospital Stay (HOSPITAL_COMMUNITY)
Admission: EM | Admit: 2017-06-26 | Discharge: 2017-06-30 | DRG: 291 | Disposition: A | Discharge status: Home Health | Payer: Medicare HMO | Attending: Internal Medicine | Admitting: Internal Medicine

## 2017-06-26 ENCOUNTER — Other Ambulatory Visit: Payer: Self-pay

## 2017-06-26 ENCOUNTER — Emergency Department (HOSPITAL_COMMUNITY): Payer: Medicare HMO

## 2017-06-26 ENCOUNTER — Encounter (HOSPITAL_COMMUNITY): Payer: Self-pay | Admitting: Emergency Medicine

## 2017-06-26 DIAGNOSIS — Z6831 Body mass index (BMI) 31.0-31.9, adult: Secondary | ICD-10-CM | POA: Diagnosis not present

## 2017-06-26 DIAGNOSIS — E785 Hyperlipidemia, unspecified: Secondary | ICD-10-CM | POA: Diagnosis present

## 2017-06-26 DIAGNOSIS — I4891 Unspecified atrial fibrillation: Secondary | ICD-10-CM | POA: Diagnosis not present

## 2017-06-26 DIAGNOSIS — E1129 Type 2 diabetes mellitus with other diabetic kidney complication: Secondary | ICD-10-CM | POA: Diagnosis present

## 2017-06-26 DIAGNOSIS — D631 Anemia in chronic kidney disease: Secondary | ICD-10-CM | POA: Diagnosis present

## 2017-06-26 DIAGNOSIS — K5900 Constipation, unspecified: Secondary | ICD-10-CM | POA: Diagnosis present

## 2017-06-26 DIAGNOSIS — G4733 Obstructive sleep apnea (adult) (pediatric): Secondary | ICD-10-CM | POA: Diagnosis present

## 2017-06-26 DIAGNOSIS — E669 Obesity, unspecified: Secondary | ICD-10-CM | POA: Diagnosis present

## 2017-06-26 DIAGNOSIS — J449 Chronic obstructive pulmonary disease, unspecified: Secondary | ICD-10-CM | POA: Diagnosis present

## 2017-06-26 DIAGNOSIS — Z96641 Presence of right artificial hip joint: Secondary | ICD-10-CM | POA: Diagnosis present

## 2017-06-26 DIAGNOSIS — R0600 Dyspnea, unspecified: Secondary | ICD-10-CM

## 2017-06-26 DIAGNOSIS — E1165 Type 2 diabetes mellitus with hyperglycemia: Secondary | ICD-10-CM | POA: Diagnosis present

## 2017-06-26 DIAGNOSIS — I428 Other cardiomyopathies: Secondary | ICD-10-CM | POA: Diagnosis present

## 2017-06-26 DIAGNOSIS — E1151 Type 2 diabetes mellitus with diabetic peripheral angiopathy without gangrene: Secondary | ICD-10-CM | POA: Diagnosis present

## 2017-06-26 DIAGNOSIS — Z86711 Personal history of pulmonary embolism: Secondary | ICD-10-CM

## 2017-06-26 DIAGNOSIS — E1122 Type 2 diabetes mellitus with diabetic chronic kidney disease: Secondary | ICD-10-CM | POA: Diagnosis present

## 2017-06-26 DIAGNOSIS — E118 Type 2 diabetes mellitus with unspecified complications: Secondary | ICD-10-CM | POA: Diagnosis present

## 2017-06-26 DIAGNOSIS — I5023 Acute on chronic systolic (congestive) heart failure: Secondary | ICD-10-CM | POA: Diagnosis present

## 2017-06-26 DIAGNOSIS — I340 Nonrheumatic mitral (valve) insufficiency: Secondary | ICD-10-CM | POA: Diagnosis not present

## 2017-06-26 DIAGNOSIS — E039 Hypothyroidism, unspecified: Secondary | ICD-10-CM | POA: Diagnosis present

## 2017-06-26 DIAGNOSIS — Z95810 Presence of automatic (implantable) cardiac defibrillator: Secondary | ICD-10-CM | POA: Diagnosis present

## 2017-06-26 DIAGNOSIS — I509 Heart failure, unspecified: Secondary | ICD-10-CM

## 2017-06-26 DIAGNOSIS — Z7901 Long term (current) use of anticoagulants: Secondary | ICD-10-CM

## 2017-06-26 DIAGNOSIS — B9620 Unspecified Escherichia coli [E. coli] as the cause of diseases classified elsewhere: Secondary | ICD-10-CM | POA: Diagnosis present

## 2017-06-26 DIAGNOSIS — IMO0002 Reserved for concepts with insufficient information to code with codable children: Secondary | ICD-10-CM | POA: Diagnosis present

## 2017-06-26 DIAGNOSIS — I130 Hypertensive heart and chronic kidney disease with heart failure and stage 1 through stage 4 chronic kidney disease, or unspecified chronic kidney disease: Principal | ICD-10-CM | POA: Diagnosis present

## 2017-06-26 DIAGNOSIS — Z950 Presence of cardiac pacemaker: Secondary | ICD-10-CM

## 2017-06-26 DIAGNOSIS — I480 Paroxysmal atrial fibrillation: Secondary | ICD-10-CM | POA: Diagnosis present

## 2017-06-26 DIAGNOSIS — N189 Chronic kidney disease, unspecified: Secondary | ICD-10-CM | POA: Diagnosis not present

## 2017-06-26 DIAGNOSIS — I4581 Long QT syndrome: Secondary | ICD-10-CM | POA: Diagnosis present

## 2017-06-26 DIAGNOSIS — N184 Chronic kidney disease, stage 4 (severe): Secondary | ICD-10-CM | POA: Diagnosis present

## 2017-06-26 DIAGNOSIS — N390 Urinary tract infection, site not specified: Secondary | ICD-10-CM | POA: Diagnosis present

## 2017-06-26 DIAGNOSIS — K649 Unspecified hemorrhoids: Secondary | ICD-10-CM | POA: Diagnosis present

## 2017-06-26 DIAGNOSIS — R531 Weakness: Secondary | ICD-10-CM | POA: Diagnosis present

## 2017-06-26 DIAGNOSIS — Z79899 Other long term (current) drug therapy: Secondary | ICD-10-CM

## 2017-06-26 DIAGNOSIS — F17210 Nicotine dependence, cigarettes, uncomplicated: Secondary | ICD-10-CM | POA: Diagnosis present

## 2017-06-26 DIAGNOSIS — I5043 Acute on chronic combined systolic (congestive) and diastolic (congestive) heart failure: Secondary | ICD-10-CM | POA: Diagnosis present

## 2017-06-26 DIAGNOSIS — R05 Cough: Secondary | ICD-10-CM | POA: Diagnosis present

## 2017-06-26 DIAGNOSIS — K219 Gastro-esophageal reflux disease without esophagitis: Secondary | ICD-10-CM | POA: Diagnosis present

## 2017-06-26 DIAGNOSIS — N179 Acute kidney failure, unspecified: Secondary | ICD-10-CM | POA: Diagnosis not present

## 2017-06-26 DIAGNOSIS — K625 Hemorrhage of anus and rectum: Secondary | ICD-10-CM | POA: Diagnosis not present

## 2017-06-26 DIAGNOSIS — R319 Hematuria, unspecified: Secondary | ICD-10-CM | POA: Diagnosis not present

## 2017-06-26 LAB — COMPREHENSIVE METABOLIC PANEL
ALT: 28 U/L (ref 14–54)
ANION GAP: 17 — AB (ref 5–15)
AST: 32 U/L (ref 15–41)
Albumin: 3.8 g/dL (ref 3.5–5.0)
Alkaline Phosphatase: 49 U/L (ref 38–126)
BUN: 70 mg/dL — ABNORMAL HIGH (ref 6–20)
CHLORIDE: 105 mmol/L (ref 101–111)
CO2: 18 mmol/L — ABNORMAL LOW (ref 22–32)
CREATININE: 3.01 mg/dL — AB (ref 0.44–1.00)
Calcium: 9.9 mg/dL (ref 8.9–10.3)
GFR, EST AFRICAN AMERICAN: 17 mL/min — AB (ref >60)
GFR, EST NON AFRICAN AMERICAN: 14 mL/min — AB (ref >60)
Glucose, Bld: 212 mg/dL — ABNORMAL HIGH (ref 65–99)
Potassium: 4.4 mmol/L (ref 3.5–5.1)
Sodium: 140 mmol/L (ref 135–145)
Total Bilirubin: 1.3 mg/dL — ABNORMAL HIGH (ref 0.3–1.2)
Total Protein: 7.1 g/dL (ref 6.5–8.1)

## 2017-06-26 LAB — CBC WITH DIFFERENTIAL/PLATELET
Basophils Absolute: 0 10*3/uL (ref 0.0–0.1)
Basophils Relative: 0 %
EOS ABS: 0 10*3/uL (ref 0.0–0.7)
Eosinophils Relative: 0 %
HCT: 34 % — ABNORMAL LOW (ref 36.0–46.0)
HEMOGLOBIN: 10.5 g/dL — AB (ref 12.0–15.0)
LYMPHS ABS: 1 10*3/uL (ref 0.7–4.0)
Lymphocytes Relative: 13 %
MCH: 31 pg (ref 26.0–34.0)
MCHC: 30.9 g/dL (ref 30.0–36.0)
MCV: 100.3 fL — ABNORMAL HIGH (ref 78.0–100.0)
Monocytes Absolute: 0.6 10*3/uL (ref 0.1–1.0)
Monocytes Relative: 7 %
NEUTROS ABS: 6.4 10*3/uL (ref 1.7–7.7)
NEUTROS PCT: 80 %
Platelets: 242 10*3/uL (ref 150–400)
RBC: 3.39 MIL/uL — AB (ref 3.87–5.11)
RDW: 15.3 % (ref 11.5–15.5)
WBC: 8 10*3/uL (ref 4.0–10.5)

## 2017-06-26 LAB — URINALYSIS, ROUTINE W REFLEX MICROSCOPIC
BILIRUBIN URINE: NEGATIVE
Bacteria, UA: NONE SEEN
GLUCOSE, UA: NEGATIVE mg/dL
Hgb urine dipstick: NEGATIVE
KETONES UR: NEGATIVE mg/dL
Leukocytes, UA: NEGATIVE
NITRITE: NEGATIVE
PH: 5 (ref 5.0–8.0)
Protein, ur: 30 mg/dL — AB
Specific Gravity, Urine: 1.012 (ref 1.005–1.030)

## 2017-06-26 LAB — BRAIN NATRIURETIC PEPTIDE: B NATRIURETIC PEPTIDE 5: 2226 pg/mL — AB (ref 0.0–100.0)

## 2017-06-26 LAB — MRSA PCR SCREENING: MRSA by PCR: NEGATIVE

## 2017-06-26 LAB — TROPONIN I: TROPONIN I: 0.05 ng/mL — AB (ref <0.03)

## 2017-06-26 LAB — MAGNESIUM: MAGNESIUM: 2.4 mg/dL (ref 1.7–2.4)

## 2017-06-26 LAB — PROTIME-INR
INR: 2.97
Prothrombin Time: 30.6 seconds — ABNORMAL HIGH (ref 11.4–15.2)

## 2017-06-26 MED ORDER — ENSURE ENLIVE PO LIQD
237.0000 mL | Freq: Two times a day (BID) | ORAL | Status: DC
  Administered 2017-06-26 – 2017-06-30 (×5): 237 mL via ORAL
  Filled 2017-06-26 (×6): qty 237

## 2017-06-26 MED ORDER — FERROUS SULFATE 325 (65 FE) MG PO TABS
325.0000 mg | ORAL_TABLET | Freq: Two times a day (BID) | ORAL | Status: DC
  Administered 2017-06-26 – 2017-06-30 (×9): 325 mg via ORAL
  Filled 2017-06-26 (×15): qty 1

## 2017-06-26 MED ORDER — SODIUM CHLORIDE 0.9 % IV SOLN
250.0000 mL | INTRAVENOUS | Status: DC | PRN
  Administered 2017-06-29: 250 mL via INTRAVENOUS

## 2017-06-26 MED ORDER — ONDANSETRON HCL 4 MG/2ML IJ SOLN: 4.0000 mg | Freq: Four times a day (QID) | INTRAMUSCULAR | Status: DC | PRN

## 2017-06-26 MED ORDER — OXYCODONE HCL 5 MG PO TABS: 5.0000 mg | ORAL_TABLET | Freq: Four times a day (QID) | ORAL | Status: DC | PRN

## 2017-06-26 MED ORDER — LEVOTHYROXINE SODIUM 50 MCG PO TABS
50.0000 ug | ORAL_TABLET | Freq: Every day | ORAL | Status: DC
  Administered 2017-06-27 – 2017-06-30 (×4): 50 ug via ORAL
  Filled 2017-06-26 (×4): qty 1

## 2017-06-26 MED ORDER — LEVOFLOXACIN IN D5W 500 MG/100ML IV SOLN
500.0000 mg | INTRAVENOUS | Status: DC
  Filled 2017-06-26: qty 100

## 2017-06-26 MED ORDER — OXYCODONE-ACETAMINOPHEN 10-325 MG PO TABS: 1.0000 | ORAL_TABLET | Freq: Four times a day (QID) | ORAL | Status: DC | PRN

## 2017-06-26 MED ORDER — LISINOPRIL 10 MG PO TABS
10.0000 mg | ORAL_TABLET | Freq: Every day | ORAL | Status: DC
  Administered 2017-06-26: 10 mg via ORAL
  Filled 2017-06-26 (×2): qty 1

## 2017-06-26 MED ORDER — ONDANSETRON HCL 4 MG/2ML IJ SOLN
4.0000 mg | Freq: Four times a day (QID) | INTRAMUSCULAR | Status: DC | PRN
  Administered 2017-06-29: 4 mg via INTRAVENOUS
  Filled 2017-06-26: qty 2

## 2017-06-26 MED ORDER — WARFARIN SODIUM 1 MG PO TABS
1.0000 mg | ORAL_TABLET | Freq: Once | ORAL | Status: AC
  Administered 2017-06-26: 1 mg via ORAL
  Filled 2017-06-26: qty 1

## 2017-06-26 MED ORDER — SODIUM CHLORIDE 0.9% FLUSH: 3.0000 mL | INTRAVENOUS | Status: DC | PRN

## 2017-06-26 MED ORDER — PRAVASTATIN SODIUM 40 MG PO TABS
80.0000 mg | ORAL_TABLET | Freq: Every day | ORAL | Status: DC
  Administered 2017-06-26 – 2017-06-29 (×4): 80 mg via ORAL
  Filled 2017-06-26 (×7): qty 2

## 2017-06-26 MED ORDER — CALCITRIOL 0.25 MCG PO CAPS
0.2500 ug | ORAL_CAPSULE | Freq: Every day | ORAL | Status: DC
  Administered 2017-06-26 – 2017-06-30 (×5): 0.25 ug via ORAL
  Filled 2017-06-26 (×8): qty 1

## 2017-06-26 MED ORDER — WARFARIN - PHARMACIST DOSING INPATIENT
Status: DC
  Administered 2017-06-29 – 2017-06-30 (×2)

## 2017-06-26 MED ORDER — DEXTROSE 5 % IV SOLN
1.0000 g | INTRAVENOUS | Status: DC
  Administered 2017-06-26 – 2017-06-29 (×4): 1 g via INTRAVENOUS
  Filled 2017-06-26 (×4): qty 10

## 2017-06-26 MED ORDER — SPIRONOLACTONE 25 MG PO TABS
12.5000 mg | ORAL_TABLET | Freq: Every day | ORAL | Status: DC
  Administered 2017-06-26: 12.5 mg via ORAL
  Filled 2017-06-26 (×3): qty 0.5
  Filled 2017-06-26: qty 1
  Filled 2017-06-26: qty 0.5

## 2017-06-26 MED ORDER — COLCHICINE 0.6 MG PO TABS: 0.6000 mg | ORAL_TABLET | Freq: Two times a day (BID) | ORAL | Status: DC | PRN

## 2017-06-26 MED ORDER — ISOSORBIDE MONONITRATE ER 60 MG PO TB24
30.0000 mg | ORAL_TABLET | Freq: Every day | ORAL | Status: DC
  Administered 2017-06-26 – 2017-06-30 (×5): 30 mg via ORAL
  Filled 2017-06-26 (×8): qty 1

## 2017-06-26 MED ORDER — OXYCODONE-ACETAMINOPHEN 5-325 MG PO TABS: 1.0000 | ORAL_TABLET | Freq: Four times a day (QID) | ORAL | Status: DC | PRN

## 2017-06-26 MED ORDER — SODIUM CHLORIDE 0.9% FLUSH
3.0000 mL | Freq: Two times a day (BID) | INTRAVENOUS | Status: DC
  Administered 2017-06-26 – 2017-06-30 (×7): 3 mL via INTRAVENOUS

## 2017-06-26 MED ORDER — ACETAMINOPHEN 325 MG PO TABS: 650.0000 mg | ORAL_TABLET | Freq: Four times a day (QID) | ORAL | Status: DC | PRN

## 2017-06-26 MED ORDER — ONDANSETRON HCL 4 MG PO TABS
4.0000 mg | ORAL_TABLET | Freq: Four times a day (QID) | ORAL | Status: DC | PRN
  Filled 2017-06-26 (×3): qty 1

## 2017-06-26 MED ORDER — CARVEDILOL 3.125 MG PO TABS
6.2500 mg | ORAL_TABLET | Freq: Two times a day (BID) | ORAL | Status: DC
  Administered 2017-06-26 – 2017-06-30 (×9): 6.25 mg via ORAL
  Filled 2017-06-26 (×9): qty 2

## 2017-06-26 MED ORDER — ACETAMINOPHEN 650 MG RE SUPP: 650.0000 mg | Freq: Four times a day (QID) | RECTAL | Status: DC | PRN

## 2017-06-26 MED ORDER — FUROSEMIDE 10 MG/ML IJ SOLN
60.0000 mg | Freq: Two times a day (BID) | INTRAMUSCULAR | Status: DC
  Administered 2017-06-26 – 2017-06-28 (×3): 60 mg via INTRAVENOUS
  Filled 2017-06-26 (×3): qty 6

## 2017-06-26 MED ORDER — POLYETHYLENE GLYCOL 3350 17 G PO PACK
17.0000 g | PACK | Freq: Every day | ORAL | Status: DC | PRN
  Administered 2017-06-29: 17 g via ORAL
  Filled 2017-06-26: qty 1

## 2017-06-26 MED ORDER — ACETAMINOPHEN 325 MG PO TABS
650.0000 mg | ORAL_TABLET | ORAL | Status: DC | PRN
  Filled 2017-06-26: qty 2

## 2017-06-26 MED ORDER — AMIODARONE HCL 200 MG PO TABS
200.0000 mg | ORAL_TABLET | Freq: Every day | ORAL | Status: DC
  Administered 2017-06-26 – 2017-06-30 (×5): 200 mg via ORAL
  Filled 2017-06-26 (×5): qty 1

## 2017-06-26 MED ORDER — ONDANSETRON HCL 4 MG PO TABS
4.0000 mg | ORAL_TABLET | ORAL | Status: DC | PRN
  Administered 2017-06-26 – 2017-06-28 (×3): 4 mg via ORAL

## 2017-06-26 NOTE — Progress Notes (Addendum)
Pharmacy Antibiotic Note  [name] is a 72 y.o. female admitted on 06/26/2017 with UTI.  Pharmacy has been consulted for UTI dosing.  Plan: Levaquin 500mg  IV every 48 hours. Monitor labs, micro and vitals.   Height: 5\' 5"  (165.1 cm) Weight: 190 lb (86.2 kg) IBW/kg (Calculated) : 57  Temp (24hrs), Avg:98 F (36.7 C), Min:98 F (36.7 C), Max:98 F (36.7 C)  Recent Labs  Lab 06/26/17 0806  WBC 8.0  CREATININE 3.01*    Estimated Creatinine Clearance: 18.1 mL/min (A) (by C-G formula based on SCr of 3.01 mg/dL (H)).    No Known Allergies  Antimicrobials this admission: Levaquin 1/4 >>    >>   Dose adjustments this admission: Levaquin adjusted for renal function  Microbiology results:  BCx:  1/4 UCx: pending   Sputum:    MRSA PCR:   Thank you for allowing pharmacy to be a part of this patient's care.  Pricilla Larsson 06/26/2017 1:41 PM

## 2017-06-26 NOTE — ED Notes (Signed)
CRITICAL VALUE ALERT  Critical Value:  Troponin 0.05  Date & Time Notied:  06/26/17 0911  Provider Notified: Alvino Chapel  Orders Received/Actions taken:

## 2017-06-26 NOTE — ED Triage Notes (Signed)
Pt reports cough, body aches, chills, and shortness of breath since last night.

## 2017-06-26 NOTE — Progress Notes (Signed)
ANTICOAGULATION CONSULT NOTE - Initial Consult  Pharmacy Consult for Warfarin (home med) Indication: atrial fibrillation  No Known Allergies  Patient Measurements: Height: 5\' 5"  (165.1 cm) Weight: 190 lb (86.2 kg) IBW/kg (Calculated) : 57 Heparin Dosing Weight:   Vital Signs: Temp: 98 F (36.7 C) (01/04 0733) Temp Source: Oral (01/04 0733) BP: 121/95 (01/04 1300) Pulse Rate: 92 (01/04 1215)  Labs: Recent Labs    06/26/17 0806  HGB 10.5*  HCT 34.0*  PLT 242  LABPROT 30.6*  INR 2.97  CREATININE 3.01*  TROPONINI 0.05*    Estimated Creatinine Clearance: 18.1 mL/min (A) (by C-G formula based on SCr of 3.01 mg/dL (H)).   Medical History: Past Medical History:  Diagnosis Date  . Anemia    a. mild/chronic  . Anemia due to GI blood loss 02/26/2016  . Anemia in chronic renal disease 11/30/2014  . Cardiomyopathy, nonischemic ([place])    a. 1999 nl cath;  b. 12/05 Guidant [place];  c. 10/2005 ICD extraction 2/2 enterococcus bacteremia and Veg on RV lead;  c. 05/2008 low risk Myoview (scarring w/ some evidence of inf ischemia);  d. 11/2012 Echo: EF 15-20%;  e. 01/2013 s/p MDT [place] CRT D, ser # [id number];  f. 05/2013 Echo: EF 15%.  . Chronic systolic CHF (congestive heart failure) ([place])    a. 11/2012 Echo: EF 15-20%;  b. 05/2013 TEE EF 15%.  . CKD (chronic kidney disease), stage III ([place])    creatinin-1.44 in 1/09; 1.51 in 1/10  . Degenerative joint disease    of knees, shoulder, and hips  . Enterococcal infection    a. 10/2005 - AICD-explanted  . GERD (gastroesophageal reflux disease)   . Hilar density    a. infrahilar mass/adenopathy on CT scan 5/07; subsequently  resolved  . History of blood transfusion   . Hyperlipidemia   . Hypertension   . Hypothyroidism   . ICD (implantable cardioverter-defibrillator) infection ([place])    removed 2016  . Implantable cardioverter-defibrillator-CRT- Mdt    a.  01/2013 s/p MDT [place] CRT D, ser # [id number] - ICD has been removed  . LBBB  (left bundle branch block)   . Obstructive sleep apnea    uses cpap  . PAF (paroxysmal atrial fibrillation) ([place])    a. 07/2012 s/p TEE/DCCV;  b. chronic coumadin;  c. 05/2013 Recurrent Afib->TEE/DCCV and amio initiation.  . Peripheral vascular disease ([place])   . Pneumonia 07/2005  . Pulmonary embolism ([place])    a. 07/2005 after total right hip arthroplasty  . Tobacco abuse    a. discontinued in 1997, and then resumed  . Type II diabetes mellitus ([place])    type 2  . Urinary incontinence   . Villous adenoma of colon    a. tubovillous adenomatous polyp with focal high grade dysplasia; presented with hematochezia - followed by Dr. Laural Golden.   Medications:   (Not in a hospital admission)  Assessment: OK for protocol.  INR at high end of goal range.  Last dose 06/25/17 per home med list.   Home regimen 1.25mg  MWF and 2.5mg  TTSS  Goal of Therapy:  INR 2-3   Plan:  Warfarin 1mg  PO x 1 Daily PT/INR Monitor for signs and symptoms of bleeding.   Pricilla Larsson 06/26/2017,1:47 PM

## 2017-06-26 NOTE — ED Notes (Addendum)
Pt c/o being hot. Door opened. Suspicious activity on monitor. Another ekg done and given to dr Alvino Chapel which states look same as first one. Pt denise pain

## 2017-06-26 NOTE — H&P (Addendum)
TRH H&P   Patient Demographics:    [name], is a 72 y.o. female  MRN: [id number]   DOB - 1945/08/18  Admit Date - 06/26/2017  Outpatient Primary MD for the patient is Lemmie Evens, MD  Referring MD: Dr Alvino Chapel  Outpatient Specialists:  Dr. Aundra Dubin cardiology Dr. Joelyn Oms, renal   Patient coming from: Home  Chief Complaint  Patient presents with  . Shortness of Breath      HPI:    [name]  is a 72 y.o. 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 Patient was diagnosed with UTI by her PCP on 12/31 and was being treated with cefuroxime 500 mg twice a day but since her symptoms did not improve she was switched to Levaquin yesterday. The urine culture was available to PCP today and grew 50-100 take Escherichia coli which is sensitive to cephalosporins, quinolone, Zosyn, Bactrim and Augmentin. Patient reports having generalized weakness with increasing shortness of breath, orthopnea, nonproductive cough and body aches. Patient denies any headache, dizziness, chest pain, palpitations, reports nausea but no vomiting, abdominal pain, diarrhea, muscle aches or joint pain. Denies any fevers or chills. Reports some dysuria. Denies any recent illness or sick contact. Denies any recent travel. Patient reports that she has noticed some weight gain but is unable to specify. Reports being adherent to her medications.  Course in the ED Patient was afebrile, occasionally heart rate in the 120s (A. fib), tachypneic to the 30s, stable blood pressure and O2 sat on room air. Blood work showed a normal WBC, 11  of 10.5 (baseline), chemistry showed BUN of 70 and creatinine of 2.01 (at baseline), troponin of 0.05 and glucose of 212. BNP was markedly elevated at 2200. INR was 2.97. UA was negative for UTI. Chest x-ray showed cardiomegaly without fluid overload.  Hospitalist consulted for admission to telemetry for acute on chronic systolic CHF.     Review of systems:    In addition to the HPI above, No Fever-chills, No Headache, No changes with Vision or hearing, No problems swallowing food or Liquids, Cough and shortness of breath, orthopnea +, no chest pain No Abdominal pain, nausea +, no vomiting Bowel movements are regular, No Blood in stool or Urine, Dysuria + No new skin rashes or bruises, No new joints pains-aches,  Generalized weakness +, tingling, numbness in any extremity, Recent weight gain No polyuria, polydypsia or polyphagia, No significant Mental Stressors.     With Past History of the following :    Past Medical History:  Diagnosis Date  . Anemia    a. mild/chronic  . Anemia  due to GI blood loss 02/26/2016  . Anemia in chronic renal disease 11/30/2014  . Cardiomyopathy, nonischemic ([place])    a. 1999 nl cath;  b. 12/05 [employer] [place];  c. 10/2005 ICD extraction 2/2 enterococcus bacteremia and Veg on RV lead;  c. 05/2008 low risk Myoview (scarring w/ some evidence of inf ischemia);  d. 11/2012 Echo: EF 15-20%;  e. 01/2013 s/p MDT [place] CRT D, ser # [id number];  f. 05/2013 Echo: EF 15%.  . Chronic systolic CHF (congestive heart failure) ([place])    a. 11/2012 Echo: EF 15-20%;  b. 05/2013 TEE EF 15%.  . CKD (chronic kidney disease), stage III ([place])    creatinin-1.44 in 1/09; 1.51 in 1/10  . Degenerative joint disease    of knees, shoulder, and hips  . Enterococcal infection    a. 10/2005 - AICD-explanted  . GERD (gastroesophageal reflux disease)   . Hilar density    a. infrahilar mass/adenopathy on CT scan 5/07; subsequently  resolved  . History of blood transfusion   .  Hyperlipidemia   . Hypertension   . Hypothyroidism   . ICD (implantable cardioverter-defibrillator) infection ([place])    removed 2016  . Implantable cardioverter-defibrillator-CRT- Mdt    a.  01/2013 s/p MDT [place] CRT D, ser # [id number] - ICD has been removed  . LBBB (left bundle branch block)   . Obstructive sleep apnea    uses cpap  . PAF (paroxysmal atrial fibrillation) ([place])    a. 07/2012 s/p TEE/DCCV;  b. chronic coumadin;  c. 05/2013 Recurrent Afib->TEE/DCCV and amio initiation.  . Peripheral vascular disease ([place])   . Pneumonia 07/2005  . Pulmonary embolism ([place])    a. 07/2005 after total right hip arthroplasty  . Tobacco abuse    a. discontinued in 1997, and then resumed  . Type II diabetes mellitus ([place])    type 2  . Urinary incontinence   . Villous adenoma of colon    a. tubovillous adenomatous polyp with focal high grade dysplasia; presented with hematochezia - followed by Dr. Laural Golden.      Past Surgical History:  Procedure Laterality Date  . A-V CARDIAC PACEMAKER INSERTION  12/05   Biventricular pacemaker/AICD  . ABDOMINAL HYSTERECTOMY  1990/92   Initial partial hysterectomy followed by BSO  . BASCILIC VEIN TRANSPOSITION Right 03/05/2017   Procedure: RIGHT 1ST STAGE BASCILIC VEIN TRANSPOSITION;  Surgeon: Serafina Mitchell, MD;  Location: [place];  Service: Vascular;  Laterality: Right;  . BASCILIC VEIN TRANSPOSITION Right 05/28/2017   Procedure: BASILIC VEIN TRANSPOSITION SECOND STAGE RIGHT;  Surgeon: Serafina Mitchell, MD;  [facility];  Service: Vascular;  Laterality: Right;  . BI-VENTRICULAR IMPLANTABLE CARDIOVERTER DEFIBRILLATOR N/A 01/24/2013   Procedure: BI-VENTRICULAR IMPLANTABLE CARDIOVERTER DEFIBRILLATOR  (CRT-D);  Surgeon: Evans Lance, MD;  Location[facility] LAB;  Service: Cardiovascular;  Laterality: N/A;  . BI-VENTRICULAR IMPLANTABLE CARDIOVERTER DEFIBRILLATOR  (CRT-D)  01/24/2013  . CARDIAC CATHETERIZATION    . CARDIOVERSION N/A 08/20/2012   Procedure: TEE  GUIDED CARDIOVERSION;  Surgeon: Yehuda Savannah, MD;  Location: [facility];  Service: Cardiovascular;  Laterality: N/A;  To be done @ bedside  . CARDIOVERSION N/A 06/20/2013   Procedure: CARDIOVERSION;  Surgeon: Dorothy Spark, MD;  Location: [place];  Service: Cardiovascular;  Laterality: N/A;  . CATARACT EXTRACTION W/PHACO Left 08/07/2015   Procedure: CATARACT EXTRACTION PHACO AND INTRAOCULAR LENS PLACEMENT ([place]);  Surgeon: Rutherford Guys, MD;  Location: [facility];  Service: Ophthalmology;  Laterality: Left;  CDE:  7.88  . CATARACT EXTRACTION W/PHACO Right 08/21/2015   Procedure: CATARACT EXTRACTION PHACO AND INTRAOCULAR LENS PLACEMENT (IOC);  Surgeon: Rutherford Guys, MD;  Location: [facility];  Service: Ophthalmology;  Laterality: Right;  CDE:8.55  . COLONOSCOPY Left 10/24/2014   Procedure: COLONOSCOPY;  Surgeon: Carol Ada, MD;  Location: [facility] ENDOSCOPY;  Service: Endoscopy;  Laterality: Left;  . COLONOSCOPY W/ POLYPECTOMY  2009  . COLONOSCOPY WITH ESOPHAGOGASTRODUODENOSCOPY (EGD) N/A 06/10/2013   Procedure: COLONOSCOPY WITH ESOPHAGOGASTRODUODENOSCOPY (EGD);  Surgeon: Rogene Houston, MD;  [facility];  Service: Endoscopy;  Laterality: N/A;  925  . ESOPHAGOGASTRODUODENOSCOPY N/A 10/21/2014   Procedure: ESOPHAGOGASTRODUODENOSCOPY (EGD);  Surgeon: Inda Castle, MD;  Location: [place];  Service: Endoscopy;  Laterality: N/A;  . GIVENS CAPSULE STUDY N/A 10/24/2014   Procedure: GIVENS CAPSULE STUDY;  Surgeon: Carol Ada, MD;  Location: [place];  Service: Endoscopy;  Laterality: N/A;  . ICD LEAD REMOVAL N/A 12/13/2014   Procedure: ICD LEAD REMOVAL/EXTRACTION ;  Surgeon: Evans Lance, MD;  Location: [place];  Service: Cardiovascular;  Laterality: N/A;  [place] back up  . KNEE ARTHROSCOPY Right 1980's?  Marland Kitchen PACEMAKER REMOVAL  11/18/05   Enterococcal infection  . RIGHT HEART CATHETERIZATION N/A 04/18/2014   Procedure: RIGHT HEART CATH;  Surgeon: Larey Dresser, MD;  Location: [facility] CATH LAB;   Service: Cardiovascular;  Laterality: N/A;  . TEE WITHOUT CARDIOVERSION N/A 08/20/2012   Procedure: TRANSESOPHAGEAL ECHOCARDIOGRAM (TEE);  Surgeon: Yehuda Savannah, MD;  [facility];  Service: Cardiovascular;  Laterality: N/A;  . TEE WITHOUT CARDIOVERSION N/A 06/20/2013   Procedure: TRANSESOPHAGEAL ECHOCARDIOGRAM (TEE);  Surgeon: Dorothy Spark, MD;  Location: [place];  Service: Cardiovascular;  Laterality: N/A;  . TEE WITHOUT CARDIOVERSION N/A 12/08/2014   Procedure: TRANSESOPHAGEAL ECHOCARDIOGRAM (TEE);  Surgeon: Fay Records, MD;  [facility];  Service: Cardiovascular;  Laterality: N/A;  . TOTAL HIP ARTHROPLASTY Right 07/2005  . TUBAL LIGATION  1980's      Social History:     Social History   Tobacco Use  . Smoking status: Former Smoker    Years: 12.00    Types: Cigarettes  . Smokeless tobacco: Never Used  . Tobacco comment: 05/2013: Smokes an occasional cigarette.  Says that she doesn't inhale.  Substance Use Topics  . Alcohol use: No    Alcohol/week: 0.0 oz     Lives - home with husband  Mobility - independent     Family History :     Family History  Problem Relation Age of Onset  . Hypertension Mother   . Diabetes Mother   . Coronary artery disease Father   . Diabetes Brother   . Hypertension Brother   . Lung cancer Brother   . Arthritis Other   . Diabetes Other   . Heart disease Other        female < 55     Home Medications:   Prior to Admission medications   Medication Sig Start Date End Date Taking? Authorizing Provider  amiodarone (PACERONE) 200 MG tablet Take 200 mg by mouth daily.  02/25/17  Yes [provider]  calcitRIOL (ROCALTROL) 0.25 MCG capsule Take 0.25 mcg by mouth daily. 06/09/16  Yes [provider]  carvedilol (COREG) 6.25 MG tablet TAKE 1 TABLET BY MOUTH TWICE DAILY WITH A MEAL. 06/13/16  Yes Larey Dresser, MD  colchicine 0.6 MG tablet Take 0.6 mg by mouth 2 (two) times daily as needed (gout).  Reported on 11/06/2015  12/20/13  Yes [provider]  feeding supplement, ENSURE ENLIVE, (ENSURE ENLIVE) LIQD Take 237 mLs by mouth 2 (two) times daily between meals. Patient taking differently: Take 237 mLs by mouth daily with lunch.  10/26/14  Yes Riccardo Dubin, MD  ferrous sulfate 324 (65 FE) MG TBEC Take 1 tablet (325 mg total) by mouth 2 (two) times daily. 12/02/14  Yes Tat, Shanon Brow, MD  furosemide (LASIX) 40 MG tablet TAKE 1 TABLET BY MOUTH TWICE DAILY. 05/27/16  Yes Larey Dresser, MD  glimepiride (AMARYL) 2 MG tablet Take 2 mg by mouth daily with breakfast.  12/05/14  Yes [provider]  isosorbide mononitrate (IMDUR) 30 MG 24 hr tablet Take 1 tablet (30 mg total) by mouth daily. 10/26/14  Yes Riccardo Dubin, MD  levofloxacin (LEVAQUIN) 250 MG tablet Take 250 mg by mouth every other day. Take  2 tablets on day one and then one tablet every other day For 10 days 06/25/17  Yes [provider]  levothyroxine (SYNTHROID, LEVOTHROID) 50 MCG tablet Take 50 mcg by mouth daily before breakfast. 02/25/17  Yes [provider]  lisinopril (PRINIVIL,ZESTRIL) 10 MG tablet TAKE ONE TABLET BY MOUTH DAILY. Patient taking differently: TAKE ONE-HALF TABLET BY MOUTH DAILY. 06/13/16  Yes Larey Dresser, MD  ondansetron (ZOFRAN) 4 MG tablet Take 4 mg by mouth every 4 (four) hours as needed for nausea or vomiting. Reported on 11/06/2015   Yes [provider]  oxyCODONE-acetaminophen (PERCOCET) 10-325 MG per tablet Take 1 tablet by mouth every 6 (six) hours as needed for pain.  12/09/13  Yes [provider]  polyethylene glycol powder (GLYCOLAX/MIRALAX) powder Take 17 g by mouth daily as needed (for constipation.).  02/25/17  Yes [provider]  pravastatin (PRAVACHOL) 40 MG tablet Take 80 mg by mouth at bedtime.    Yes [provider]  spironolactone (ALDACTONE) 25 MG tablet TAKE 1/2 TABLET BY MOUTH DAILY. 03/18/16  Yes Bensimhon, Shaune Pascal, MD  warfarin  (COUMADIN) 2.5 MG tablet Take 0.5-1 tablets (1.25-2.5 mg total) by mouth daily at 6 PM. Takes 1 tablet on Mon, Wed, Fri, take 0.5 tablet on all other days Patient taking differently: Take 1.25-2.5 mg by mouth daily at 6 PM. Take 0.5 tablet (1.25 mg) by mouth on Monday, Wednesday, & Friday then Take 1 tablet (5 mg) on all other days Sunday, Tuesday, Thursday, & Saturday 01/10/15  Yes Samuella Cota, MD     Allergies:    No Known Allergies   Physical Exam:   Vitals  Blood pressure (!) 121/95, pulse 92, temperature 98 F (36.7 C), temperature source Oral, resp. rate (!) 28, height 5\' 5"  (1.651 m), weight 86.2 kg (190 lb), SpO2 96 %.   Gen.: Elderly female appears fatigued, not in distress HEENT: Pupils reactive bilaterally, EOMI, JVD not appreciated, pallor present, no icterus, moist oral mucosa, supple neck Chest: Fine bibasilar crackles, no rhonchi or wheeze CVS: S1 and S2 irregularly irregular, no murmurs rub or gallop GI: Soft, nondistended, nontender, bowel sounds present Musculoskeletal: Warm, trace pitting edema bilaterally  CNS: Alert and oriented   Data Review:    CBC Recent Labs  Lab 06/26/17 0806  WBC 8.0  HGB 10.5*  HCT 34.0*  PLT 242  MCV 100.3*  MCH 31.0  MCHC 30.9  RDW 15.3  LYMPHSABS 1.0  MONOABS 0.6  EOSABS 0.0  BASOSABS 0.0   ------------------------------------------------------------------------------------------------------------------  Chemistries  Recent Labs  Lab 06/26/17 0806  NA 140  K 4.4  CL 105  CO2 18*  GLUCOSE 212*  BUN 70*  CREATININE 3.01*  CALCIUM 9.9  AST 32  ALT 28  ALKPHOS 49  BILITOT 1.3*   ------------------------------------------------------------------------------------------------------------------ estimated creatinine clearance is 18.1 mL/min (A) (by C-G formula based on SCr of 3.01 mg/dL  (H)). ------------------------------------------------------------------------------------------------------------------ No results for input(s): TSH, T4TOTAL, T3FREE, THYROIDAB in the last 72 hours.  Invalid input(s): FREET3  Coagulation profile Recent Labs  Lab 06/26/17 0806  INR 2.97   ------------------------------------------------------------------------------------------------------------------- No results for input(s): DDIMER in the last 72 hours. -------------------------------------------------------------------------------------------------------------------  Cardiac Enzymes Recent Labs  Lab 06/26/17 0806  TROPONINI 0.05*   ------------------------------------------------------------------------------------------------------------------    Component Value Date/Time   BNP 2,226.0 (H) 06/26/2017 0806     ---------------------------------------------------------------------------------------------------------------  Urinalysis    Component Value Date/Time   COLORURINE YELLOW 06/26/2017 0830   APPEARANCEUR HAZY (A) 06/26/2017 0830   LABSPEC 1.012 06/26/2017 0830   PHURINE 5.0 06/26/2017 0830   GLUCOSEU NEGATIVE 06/26/2017 0830   HGBUR NEGATIVE 06/26/2017 0830   BILIRUBINUR NEGATIVE 06/26/2017 0830   KETONESUR NEGATIVE 06/26/2017 0830   PROTEINUR 30 (A) 06/26/2017 0830   UROBILINOGEN 0.2 01/08/2015 0940   NITRITE NEGATIVE 06/26/2017 0830   LEUKOCYTESUR NEGATIVE 06/26/2017 0830    ----------------------------------------------------------------------------------------------------------------   Imaging Results:    Dg Chest Portable 1 View  Result Date: 06/26/2017 CLINICAL DATA:  Shortness of breath. EXAM: PORTABLE CHEST 1 VIEW COMPARISON:  02/23/2015 FINDINGS: Midline trachea. Cardiomegaly accentuated by AP portable technique. No definite pleural fluid. No pneumothorax. Low lung volumes with resultant pulmonary interstitial prominence. Scarring at the left  greater than right lung bases. No lobar consolidation. Advanced degenerative changes of both glenohumeral joints. IMPRESSION: Cardiomegaly and low lung volumes.  No acute findings. Electronically Signed   By: Abigail Miyamoto M.D.   On: 06/26/2017 08:29    My personal review of EKG: A. fib at 96 with nonspecific IVCD with LAD, prolonged QTC of 534   Assessment & Plan:    Principal Problem:   Acute on chronic combined systolic and diastolic CHF (congestive heart failure) ([place]) Likely triggered by use recent UTI.  Admit to telemetry. Place on IV Lasix 60 mg every 12 hours (patient on 40 mg by mouth twice a day at home). Strict I/O and daily weight. Check 2-D echo. Continue Coreg, lisinopril, Aldactone and statin.  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 Had CRT-D placed in 2014 which was extracted in 2016 due to enteric focal bacteremia and pacemaker lead vegetation. (Prior to that she had [employer] CRT-D placed in 2005 which was removed in 2007 due to enterococcal bacteremia) Patient was following at the heart failure clinic (has not seen Dr. Aundra Dubin since October 2017). Continue medications as outlined above. She needs to follow-up with her cardiologist as outpatient.     UTI (urinary tract infection) Was started on antibiotic 5 days back, switched to Levaquin based on sensitivity by her PCP yesterday. I would continue Levaquin for now. Urine culture sent from ED. Given her prolonged QTc I will place her on IV Rocephin since urine culture was sensitive to cephalosporins.     Type 2 diabetes, controlled, with renal manifestation (HCC) Hold Amaryl. Recent A1c of 6.9. Monitor on sliding scale coverage.    Chronic renal disease, stage 4, severely decreased glomerular filtration rate (GFR) between 15-29 mL/min/1.73 square meter (HCC) Renal function around baseline. Follows with Dr. Joelyn Oms. Recently had AV graft placed by Dr. Trula Slade.  COPD (chronic  obstructive pulmonary disease) (HCC) Stable. Continue home inhalers     Paroxysmal atrial fibrillation ([place]) Hearted occasionally in 110s. Continue beta blocker and amiodarone. Resume Coumadin with dosing per pharmacy. Was previously on digoxin which was discontinued due to progressive renal disease.  Prolonged QTC (534) Avoid QT prolonging agents. Check magnesium.       OSA (obstructive sleep apnea) Continue nighttime CPAP    Obesity (BMI 30-39.9)  Generalized weakness PT evaluation.   DVT Prophylaxis on Coumadin  AM Labs Ordered, also please review Full Orders  Family Communication: Admission, patients condition and plan of care including tests being ordered have been discussed with the patient her husband and daughter at bedside   Code Status full code  Likely DC to home  Condition GUARDED   Consults called: None   Admission status: Patient   Time spent in minutes : 60   [name] Doubrava M.D on 06/26/2017 at 1:26 PM  Between 7am to 7pm - Pager - [phone]. After 7pm go to [email or web address] - password [email or web address]  [facility] - Office  [phone]

## 2017-06-26 NOTE — ED Notes (Signed)
Meal given

## 2017-06-26 NOTE — ED Provider Notes (Addendum)
[facility] EMERGENCY DEPARTMENT Provider Note   CSN: [id number] Arrival date & time: 06/26/17  1517     History   Chief Complaint Chief Complaint  Patient presents with  . Shortness of Breath    HPI CONDA [name] is a 72 y.o. female.  HPI Patient presents with shortness of breath cough body aches and feeling bad.  Reportedly started feeling bad last night but also states she saw her doctor for this on Monday and has been treated for urinary tract infection and pneumonia.  Is on Levaquin.  States she had been on a different antibiotic but the culture came back and grew E. coli.  Started on Levaquin yesterday.  Dull chest pain.  Has had nausea without vomiting.  History of atrial fibrillation but cannot tell when she is in it. Past Medical History:  Diagnosis Date  . Anemia    a. mild/chronic  . Anemia due to GI blood loss 02/26/2016  . Anemia in chronic renal disease 11/30/2014  . Cardiomyopathy, nonischemic ([place])    a. 1999 nl cath;  b. 12/05 Guidant [place];  c. 10/2005 ICD extraction 2/2 enterococcus bacteremia and Veg on RV lead;  c. 05/2008 low risk Myoview (scarring w/ some evidence of inf ischemia);  d. 11/2012 Echo: EF 15-20%;  e. 01/2013 s/p MDT [place] CRT D, ser # [id number];  f. 05/2013 Echo: EF 15%.  . Chronic systolic CHF (congestive heart failure) ([place])    a. 11/2012 Echo: EF 15-20%;  b. 05/2013 TEE EF 15%.  . CKD (chronic kidney disease), stage III ([place])    creatinin-1.44 in 1/09; 1.51 in 1/10  . Degenerative joint disease    of knees, shoulder, and hips  . Enterococcal infection    a. 10/2005 - AICD-explanted  . GERD (gastroesophageal reflux disease)   . Hilar density    a. infrahilar mass/adenopathy on CT scan 5/07; subsequently  resolved  . History of blood transfusion   . Hyperlipidemia   . Hypertension   . Hypothyroidism   . ICD (implantable cardioverter-defibrillator) infection ([place])    removed 2016  . Implantable cardioverter-defibrillator-CRT- Mdt    a.   01/2013 s/p MDT [place] CRT D, ser # [id number] - ICD has been removed  . LBBB (left bundle branch block)   . Obstructive sleep apnea    uses cpap  . PAF (paroxysmal atrial fibrillation) ([place])    a. 07/2012 s/p TEE/DCCV;  b. chronic coumadin;  c. 05/2013 Recurrent Afib->TEE/DCCV and amio initiation.  . Peripheral vascular disease ([place])   . Pneumonia 07/2005  . Pulmonary embolism ([place])    a. 07/2005 after total right hip arthroplasty  . Tobacco abuse    a. discontinued in 1997, and then resumed  . Type II diabetes mellitus ([place])    type 2  . Urinary incontinence   . Villous adenoma of colon    a. tubovillous adenomatous polyp with focal high grade dysplasia; presented with hematochezia - followed by Dr. Laural Golden.    Patient Active Problem List   Diagnosis Date Noted  . Obesity (BMI 30-39.9) 09/15/2016  . OSA (obstructive sleep apnea) 09/14/2016  . Anemia due to GI blood loss 02/26/2016  . Acute respiratory failure with hypoxia ([place]) 01/08/2015  . Paroxysmal atrial fibrillation ([place]) 01/08/2015  . Diabetes mellitus type 2 with complications ([place]) 27/08/5007  . Healthcare-associated pneumonia 01/07/2015  . HCAP (healthcare-associated pneumonia) 01/07/2015  . ICD (implantable cardioverter-defibrillator) infection ([place])   . Enterococcal bacteremia   . Diabetic feet (  [place])   . Bacteremia 12/07/2014  . SOB (shortness of breath)   . Acute on chronic combined systolic and diastolic CHF (congestive heart failure) ([place]) 12/01/2014  . COPD (chronic obstructive pulmonary disease) ([place]) 11/30/2014  . Anemia in chronic renal disease 11/30/2014  . Hypothyroidism 11/30/2014  . Fever 11/30/2014  . Symptomatic anemia 10/17/2014  . Implantable cardioverter-defibrillator-CRT- Mdt   . Atrial fibrillation ([place]) 06/19/2013  . Chronic anticoagulation 08/19/2012  . Chronic combined systolic and diastolic CHF (congestive heart failure) ([place]) 08/03/2012  . History of pulmonary embolism   . Type 2 diabetes,  uncontrolled, with renal manifestation ([place])   . Tobacco abuse, in remission   . Chronic renal disease, stage 4, severely decreased glomerular filtration rate (GFR) between 15-29 mL/min/1.73 square meter (HCC)   . LBBB 01/29/2009  . Dyslipidemia 06/04/2007  . HTN (hypertension) 06/04/2007    Past Surgical History:  Procedure Laterality Date  . A-V CARDIAC PACEMAKER INSERTION  12/05   Biventricular pacemaker/AICD  . ABDOMINAL HYSTERECTOMY  1990/92   Initial partial hysterectomy followed by BSO  . BASCILIC VEIN TRANSPOSITION Right 03/05/2017   Procedure: RIGHT 1ST STAGE BASCILIC VEIN TRANSPOSITION;  Surgeon: Serafina Mitchell, MD;  Location: [place];  Service: Vascular;  Laterality: Right;  . BASCILIC VEIN TRANSPOSITION Right 05/28/2017   Procedure: BASILIC VEIN TRANSPOSITION SECOND STAGE RIGHT;  Surgeon: Serafina Mitchell, MD;  Location: [facility];  Service: Vascular;  Laterality: Right;  . BI-VENTRICULAR IMPLANTABLE CARDIOVERTER DEFIBRILLATOR N/A 01/24/2013   Procedure: BI-VENTRICULAR IMPLANTABLE CARDIOVERTER DEFIBRILLATOR  (CRT-D);  Surgeon: Evans Lance, MD;  Location: [facility] CATH LAB;  Service: Cardiovascular;  Laterality: N/A;  . BI-VENTRICULAR IMPLANTABLE CARDIOVERTER DEFIBRILLATOR  (CRT-D)  01/24/2013  . CARDIAC CATHETERIZATION    . CARDIOVERSION N/A 08/20/2012   Procedure: TEE GUIDED CARDIOVERSION;  Surgeon: Yehuda Savannah, MD;  Location: [facility] ORS;  Service: Cardiovascular;  Laterality: N/A;  To be done @ bedside  . CARDIOVERSION N/A 06/20/2013   Procedure: CARDIOVERSION;  Surgeon: Dorothy Spark, MD;  Location: [facility];  Service: Cardiovascular;  Laterality: N/A;  . CATARACT EXTRACTION W/PHACO Left 08/07/2015   Procedure: CATARACT EXTRACTION PHACO AND INTRAOCULAR LENS PLACEMENT ([place]);  Surgeon: Rutherford Guys, MD;  Location: [facility];  Service: Ophthalmology;  Laterality: Left;  CDE: 7.88  . CATARACT EXTRACTION W/PHACO Right 08/21/2015   Procedure: CATARACT EXTRACTION PHACO AND INTRAOCULAR  LENS PLACEMENT (IOC);  Surgeon: Rutherford Guys, MD;  Location: [facility];  Service: Ophthalmology;  Laterality: Right;  CDE:8.55  . COLONOSCOPY Left 10/24/2014   Procedure: COLONOSCOPY;  Surgeon: Carol Ada, MD;  Location: [facility] ENDOSCOPY;  Service: Endoscopy;  Laterality: Left;  . COLONOSCOPY W/ POLYPECTOMY  2009  . COLONOSCOPY WITH ESOPHAGOGASTRODUODENOSCOPY (EGD) N/A 06/10/2013   Procedure: COLONOSCOPY WITH ESOPHAGOGASTRODUODENOSCOPY (EGD);  Surgeon: Rogene Houston, MD;  [facility];  Service: Endoscopy;  Laterality: N/A;  925  . ESOPHAGOGASTRODUODENOSCOPY N/A 10/21/2014   Procedure: ESOPHAGOGASTRODUODENOSCOPY (EGD);  Surgeon: Inda Castle, MD;  Location: [place];  Service: Endoscopy;  Laterality: N/A;  . GIVENS CAPSULE STUDY N/A 10/24/2014   Procedure: GIVENS CAPSULE STUDY;  Surgeon: Carol Ada, MD;  Location: [facility];  Service: Endoscopy;  Laterality: N/A;  . ICD LEAD REMOVAL N/A 12/13/2014   Procedure: ICD LEAD REMOVAL/EXTRACTION ;  Surgeon: Evans Lance, MD;  Location: [facility];  Service: Cardiovascular;  Laterality: N/A;  [place] back up  . KNEE ARTHROSCOPY Right 1980's?  Marland Kitchen PACEMAKER REMOVAL  11/18/05   Enterococcal infection  . RIGHT HEART  CATHETERIZATION N/A 04/18/2014   Procedure: RIGHT HEART CATH;  Surgeon: Larey Dresser, MD;  Location: [facility] CATH LAB;  Service: Cardiovascular;  Laterality: N/A;  . TEE WITHOUT CARDIOVERSION N/A 08/20/2012   Procedure: TRANSESOPHAGEAL ECHOCARDIOGRAM (TEE);  Surgeon: Yehuda Savannah, MD;  Location: [facility];  Service: Cardiovascular;  Laterality: N/A;  . TEE WITHOUT CARDIOVERSION N/A 06/20/2013   Procedure: TRANSESOPHAGEAL ECHOCARDIOGRAM (TEE);  Surgeon: Dorothy Spark, MD;  Location: [place];  Service: Cardiovascular;  Laterality: N/A;  . TEE WITHOUT CARDIOVERSION N/A 12/08/2014   Procedure: TRANSESOPHAGEAL ECHOCARDIOGRAM (TEE);  Surgeon: Fay Records, MD;  [facility];  Service: Cardiovascular;  Laterality: N/A;  .  TOTAL HIP ARTHROPLASTY Right 07/2005  . TUBAL LIGATION  1980's    OB History    No data available       Home Medications    Prior to Admission medications   Medication Sig Start Date End Date Taking? Authorizing Provider  amiodarone (PACERONE) 200 MG tablet Take 200 mg by mouth daily.  02/25/17  Yes [provider]  calcitRIOL (ROCALTROL) 0.25 MCG capsule Take 0.25 mcg by mouth daily. 06/09/16  Yes [provider]  carvedilol (COREG) 6.25 MG tablet TAKE 1 TABLET BY MOUTH TWICE DAILY WITH A MEAL. 06/13/16  Yes Larey Dresser, MD  colchicine 0.6 MG tablet Take 0.6 mg by mouth 2 (two) times daily as needed (gout). Reported on 11/06/2015 12/20/13  Yes [provider]  feeding supplement, ENSURE ENLIVE, (ENSURE ENLIVE) LIQD Take 237 mLs by mouth 2 (two) times daily between meals. Patient taking differently: Take 237 mLs by mouth daily with lunch.  10/26/14  Yes Riccardo Dubin, MD  ferrous sulfate 324 (65 FE) MG TBEC Take 1 tablet (325 mg total) by mouth 2 (two) times daily. 12/02/14  Yes Tat, Shanon Brow, MD  furosemide (LASIX) 40 MG tablet TAKE 1 TABLET BY MOUTH TWICE DAILY. 05/27/16  Yes Larey Dresser, MD  glimepiride (AMARYL) 2 MG tablet Take 2 mg by mouth daily with breakfast.  12/05/14  Yes [provider]  isosorbide mononitrate (IMDUR) 30 MG 24 hr tablet Take 1 tablet (30 mg total) by mouth daily. 10/26/14  Yes Riccardo Dubin, MD  levofloxacin (LEVAQUIN) 250 MG tablet Take 250 mg by mouth every other day. Take  2 tablets on day one and then one tablet every other day For 10 days 06/25/17  Yes [provider]  levothyroxine (SYNTHROID, LEVOTHROID) 50 MCG tablet Take 50 mcg by mouth daily before breakfast. 02/25/17  Yes [provider]  lisinopril (PRINIVIL,ZESTRIL) 10 MG tablet TAKE ONE TABLET BY MOUTH DAILY. Patient taking differently: TAKE ONE-HALF TABLET BY MOUTH DAILY. 06/13/16  Yes Larey Dresser, MD  metoCLOPramide (REGLAN) 5 MG tablet Take  5 mg by mouth 4 (four) times daily -  before meals and at bedtime.    Yes [provider]  oxyCODONE-acetaminophen (PERCOCET) 10-325 MG per tablet Take 1 tablet by mouth every 6 (six) hours as needed for pain.  12/09/13  Yes [provider]  polyethylene glycol powder (GLYCOLAX/MIRALAX) powder Take 17 g by mouth daily as needed (for constipation.).  02/25/17  Yes [provider]  pravastatin (PRAVACHOL) 40 MG tablet Take 80 mg by mouth at bedtime.    Yes [provider]  spironolactone (ALDACTONE) 25 MG tablet TAKE 1/2 TABLET BY MOUTH DAILY. 03/18/16  Yes Bensimhon, Shaune Pascal, MD  warfarin (COUMADIN) 2.5 MG tablet Take 0.5-1 tablets (1.25-2.5 mg total) by mouth  daily at 6 PM. Takes 1 tablet on Mon, Wed, Fri, take 0.5 tablet on all other days Patient taking differently: Take 1.25-2.5 mg by mouth daily at 6 PM. Take 0.5 tablet (1.25 mg) by mouth on Monday, Wednesday, & Friday then Take 1 tablet (5 mg) on all other days Sunday, Tuesday, Thursday, & Saturday 01/10/15  Yes Samuella Cota, MD  ondansetron (ZOFRAN) 4 MG tablet Take 4 mg by mouth every 4 (four) hours as needed for nausea or vomiting. Reported on 11/06/2015    [provider]    Family History Family History  Problem Relation Age of Onset  . Hypertension Mother   . Diabetes Mother   . Coronary artery disease Father   . Diabetes Brother   . Hypertension Brother   . Lung cancer Brother   . Arthritis Other   . Diabetes Other   . Heart disease Other        female < 49    Social History Social History   Tobacco Use  . Smoking status: Former Smoker    Years: 12.00    Types: Cigarettes  . Smokeless tobacco: Never Used  . Tobacco comment: 05/2013: Smokes an occasional cigarette.  Says that she doesn't inhale.  Substance Use Topics  . Alcohol use: No    Alcohol/week: 0.0 oz  . Drug use: No     Allergies   Patient has no known allergies.   Review of Systems Review of Systems    Constitutional: Positive for appetite change and chills.  HENT: Positive for congestion.   Respiratory: Positive for cough and shortness of breath.   Gastrointestinal: Positive for nausea.  Genitourinary: Positive for dysuria.  Musculoskeletal: Negative for back pain.  Skin: Negative for rash.  Neurological: Negative for syncope.  Hematological: Negative for adenopathy.  Psychiatric/Behavioral: Negative for confusion.     Physical Exam Updated Vital Signs BP 106/71   Pulse (!) 116   Temp 98 F (36.7 C) (Oral)   Resp 20   Ht 5\' 5"  (1.651 m)   Wt 86.2 kg (190 lb)   SpO2 96%   BMI 31.62 kg/m   Physical Exam  Constitutional: She appears well-developed.  HENT:  Head: Normocephalic.  Neck: Neck supple.  Cardiovascular:  Irregular tachycardia  Pulmonary/Chest:  Diffuse harsh breath sounds  Abdominal: Soft. There is no tenderness. There is no rebound and no guarding.  Musculoskeletal:  Mild bilateral lower extremity pitting edema.  Neurological: She is alert.  Skin: Skin is warm. Capillary refill takes less than 2 seconds.  Psychiatric: She has a normal mood and affect.     ED Treatments / Results  Labs (all labs ordered are listed, but only abnormal results are displayed) Labs Reviewed  TROPONIN I - Abnormal; Notable for the following components:      Result Value   Troponin I 0.05 (*)    All other components within normal limits  BRAIN NATRIURETIC PEPTIDE - Abnormal; Notable for the following components:   B Natriuretic Peptide 2,226.0 (*)    All other components within normal limits  COMPREHENSIVE METABOLIC PANEL - Abnormal; Notable for the following components:   CO2 18 (*)    Glucose, Bld 212 (*)    BUN 70 (*)    Creatinine, Ser 3.01 (*)    Total Bilirubin 1.3 (*)    GFR calc non Af Amer 14 (*)    GFR calc Af Amer 17 (*)    Anion gap 17 (*)    All  other components within normal limits  CBC WITH DIFFERENTIAL/PLATELET - Abnormal; Notable for the following  components:   RBC 3.39 (*)    Hemoglobin 10.5 (*)    HCT 34.0 (*)    MCV 100.3 (*)    All other components within normal limits  URINALYSIS, ROUTINE W REFLEX MICROSCOPIC - Abnormal; Notable for the following components:   APPearance HAZY (*)    Protein, ur 30 (*)    Squamous Epithelial / LPF 0-5 (*)    All other components within normal limits  PROTIME-INR - Abnormal; Notable for the following components:   Prothrombin Time 30.6 (*)    All other components within normal limits  URINE CULTURE    EKG  EKG Interpretation  Date/Time:  Friday June 26 2017 07:40:34 EST Ventricular Rate:  88 PR Interval:    QRS Duration: 184 QT Interval:  436 QTC Calculation: 528 R Axis:   -81 Text Interpretation:  Atrial fibrillation Left bundle branch block LVH with secondary repolarization abnormality Confirmed by Davonna Belling [phone]) on 06/26/2017 9:11:56 AM       Radiology Dg Chest Portable 1 View  Result Date: 06/26/2017 CLINICAL DATA:  Shortness of breath. EXAM: PORTABLE CHEST 1 VIEW COMPARISON:  02/23/2015 FINDINGS: Midline trachea. Cardiomegaly accentuated by AP portable technique. No definite pleural fluid. No pneumothorax. Low lung volumes with resultant pulmonary interstitial prominence. Scarring at the left greater than right lung bases. No lobar consolidation. Advanced degenerative changes of both glenohumeral joints. IMPRESSION: Cardiomegaly and low lung volumes.  No acute findings. Electronically Signed   By: Abigail Miyamoto M.D.   On: 06/26/2017 08:29    Procedures Procedures (including critical care time)  Medications Ordered in ED Medications - No data to display   Initial Impression / Assessment and Plan / ED Course  I have reviewed the triage vital signs and the nursing notes.  Pertinent labs & imaging results that were available during my care of the patient were reviewed by me and considered in my medical decision making (see chart for details).     Patient  presents with shortness of breath and cough.  Has been treated for what sounds like pulmonary and urinary infections.  Reportedly had positive E. coli UTI.  On Levaquin.  Worsening shortness of breath however.  Has had chills.  Swelling on legs and elevated BNP but lungs are more harsh.  Still dyspneic and elevated troponin.  Slight chest pain.  Will admit to hospitalist.  Patient is already on Coumadin. History of atrial fibrillation and appears to be currently in A. fib.  At times she will have a mild RVR.  ChadsVASC of at least 62 with age sex CHF hypertension and diabetes.   Final Clinical Impressions(s) / ED Diagnoses   Final diagnoses:  Dyspnea, unspecified type  Atrial fibrillation, unspecified type ([place])  Acute on chronic congestive heart failure, unspecified heart failure type [facility])    ED Discharge Orders    None       Davonna Belling, MD 06/26/17 1055    Davonna Belling, MD 06/26/17 1056

## 2017-06-27 ENCOUNTER — Other Ambulatory Visit: Payer: Self-pay

## 2017-06-27 ENCOUNTER — Inpatient Hospital Stay (HOSPITAL_COMMUNITY): Payer: Medicare HMO

## 2017-06-27 DIAGNOSIS — I340 Nonrheumatic mitral (valve) insufficiency: Secondary | ICD-10-CM

## 2017-06-27 DIAGNOSIS — R319 Hematuria, unspecified: Secondary | ICD-10-CM

## 2017-06-27 LAB — BASIC METABOLIC PANEL
ANION GAP: 13 (ref 5–15)
BUN: 81 mg/dL — ABNORMAL HIGH (ref 6–20)
CHLORIDE: 106 mmol/L (ref 101–111)
CO2: 20 mmol/L — AB (ref 22–32)
Calcium: 9.4 mg/dL (ref 8.9–10.3)
Creatinine, Ser: 3.38 mg/dL — ABNORMAL HIGH (ref 0.44–1.00)
GFR calc non Af Amer: 13 mL/min — ABNORMAL LOW (ref >60)
GFR, EST AFRICAN AMERICAN: 14 mL/min — AB (ref >60)
Glucose, Bld: 129 mg/dL — ABNORMAL HIGH (ref 65–99)
POTASSIUM: 4.6 mmol/L (ref 3.5–5.1)
SODIUM: 139 mmol/L (ref 135–145)

## 2017-06-27 LAB — URINE CULTURE: Culture: NO GROWTH

## 2017-06-27 LAB — ECHOCARDIOGRAM COMPLETE
Height: 65 in
WEIGHTICAEL: 3051.17 [oz_av]

## 2017-06-27 LAB — PROTIME-INR
INR: 3.97
PROTHROMBIN TIME: 38.5 s — AB (ref 11.4–15.2)

## 2017-06-27 NOTE — Progress Notes (Signed)
*  PRELIMINARY RESULTS* Echocardiogram 2D Echocardiogram has been performed.  Leavy Cella 06/27/2017, 12:41 PM

## 2017-06-27 NOTE — Progress Notes (Signed)
[place] for Warfarin (home med) Indication: atrial fibrillation  No Known Allergies  Patient Measurements: Height: 5\' 5"  (165.1 cm) Weight: 190 lb 11.2 oz (86.5 kg) IBW/kg (Calculated) : 57 Heparin Dosing Weight:   Vital Signs: Temp: 98.1 F (36.7 C) (01/05 0600) Temp Source: Oral (01/05 0600) BP: 106/66 (01/05 0600) Pulse Rate: 60 (01/05 0600)  Labs: Recent Labs    06/26/17 0806 06/27/17 0600  HGB 10.5*  --   HCT 34.0*  --   PLT 242  --   LABPROT 30.6* 38.5*  INR 2.97 3.97  CREATININE 3.01* 3.38*  TROPONINI 0.05*  --     Estimated Creatinine Clearance: 16.1 mL/min (A) (by C-G formula based on SCr of 3.38 mg/dL (H)).   Medical History: Past Medical History:  Diagnosis Date  . Anemia    a. mild/chronic  . Anemia due to GI blood loss 02/26/2016  . Anemia in chronic renal disease 11/30/2014  . Cardiomyopathy, nonischemic ([place])    a. 1999 nl cath;  b. 12/05 Guidant [place];  c. 10/2005 ICD extraction 2/2 enterococcus bacteremia and Veg on RV lead;  c. 05/2008 low risk Myoview (scarring w/ some evidence of inf ischemia);  d. 11/2012 Echo: EF 15-20%;  e. 01/2013 s/p MDT [place] CRT D, ser # [id number];  f. 05/2013 Echo: EF 15%.  . Chronic systolic CHF (congestive heart failure) ([place])    a. 11/2012 Echo: EF 15-20%;  b. 05/2013 TEE EF 15%.  . CKD (chronic kidney disease), stage III ([place])    creatinin-1.44 in 1/09; 1.51 in 1/10  . Degenerative joint disease    of knees, shoulder, and hips  . Enterococcal infection    a. 10/2005 - AICD-explanted  . GERD (gastroesophageal reflux disease)   . Hilar density    a. infrahilar mass/adenopathy on CT scan 5/07; subsequently  resolved  . History of blood transfusion   . Hyperlipidemia   . Hypertension   . Hypothyroidism   . ICD (implantable cardioverter-defibrillator) infection ([place])    removed 2016  . Implantable cardioverter-defibrillator-CRT- Mdt    a.  01/2013 s/p MDT [place] CRT D, ser #  [id number] - ICD has been removed  . LBBB (left bundle branch block)   . Obstructive sleep apnea    uses cpap  . PAF (paroxysmal atrial fibrillation) ([place])    a. 07/2012 s/p TEE/DCCV;  b. chronic coumadin;  c. 05/2013 Recurrent Afib->TEE/DCCV and amio initiation.  . Peripheral vascular disease ([place])   . Pneumonia 07/2005  . Pulmonary embolism ([place])    a. 07/2005 after total right hip arthroplasty  . Tobacco abuse    a. discontinued in 1997, and then resumed  . Type II diabetes mellitus ([place])    type 2  . Urinary incontinence   . Villous adenoma of colon    a. tubovillous adenomatous polyp with focal high grade dysplasia; presented with hematochezia - followed by Dr. Laural Golden.   Medications:  Medications Prior to Admission  Medication Sig Dispense Refill Last Dose  . amiodarone (PACERONE) 200 MG tablet Take 200 mg by mouth daily.    06/25/2017 at Unknown time  . calcitRIOL (ROCALTROL) 0.25 MCG capsule Take 0.25 mcg by mouth daily.   06/25/2017 at Unknown time  . carvedilol (COREG) 6.25 MG tablet TAKE 1 TABLET BY MOUTH TWICE DAILY WITH A MEAL. 60 tablet 0 06/25/2017 at 2030  . colchicine 0.6 MG tablet Take 0.6 mg by mouth 2 (two) times daily as needed (  gout). Reported on 11/06/2015   unknown  . feeding supplement, ENSURE ENLIVE, (ENSURE ENLIVE) LIQD Take 237 mLs by mouth 2 (two) times daily between meals. (Patient taking differently: Take 237 mLs by mouth daily with lunch. ) 237 mL 12 Past Week at Unknown time  . ferrous sulfate 324 (65 FE) MG TBEC Take 1 tablet (325 mg total) by mouth 2 (two) times daily. 60 tablet  06/25/2017 at Unknown time  . furosemide (LASIX) 40 MG tablet TAKE 1 TABLET BY MOUTH TWICE DAILY. 60 tablet 3 06/25/2017 at Unknown time  . glimepiride (AMARYL) 2 MG tablet Take 2 mg by mouth daily with breakfast.    06/25/2017 at Unknown time  . isosorbide mononitrate (IMDUR) 30 MG 24 hr tablet Take 1 tablet (30 mg total) by mouth daily. 30 tablet 0 06/25/2017 at Unknown time  . levofloxacin  (LEVAQUIN) 250 MG tablet Take 250 mg by mouth every other day. Take  2 tablets on day one and then one tablet every other day For 10 days   06/25/2017 at Unknown time  . levothyroxine (SYNTHROID, LEVOTHROID) 50 MCG tablet Take 50 mcg by mouth daily before breakfast.   06/25/2017 at Unknown time  . lisinopril (PRINIVIL,ZESTRIL) 10 MG tablet TAKE ONE TABLET BY MOUTH DAILY. (Patient taking differently: TAKE ONE-HALF TABLET BY MOUTH DAILY.) 30 tablet 0 06/25/2017 at Unknown time  . ondansetron (ZOFRAN) 4 MG tablet Take 4 mg by mouth every 4 (four) hours as needed for nausea or vomiting. Reported on 11/06/2015   06/25/2017 at Unknown time  . oxyCODONE-acetaminophen (PERCOCET) 10-325 MG per tablet Take 1 tablet by mouth every 6 (six) hours as needed for pain.    Past Week at Unknown time  . polyethylene glycol powder (GLYCOLAX/MIRALAX) powder Take 17 g by mouth daily as needed (for constipation.).    unknown  . pravastatin (PRAVACHOL) 40 MG tablet Take 80 mg by mouth at bedtime.    06/25/2017 at Unknown time  . spironolactone (ALDACTONE) 25 MG tablet TAKE 1/2 TABLET BY MOUTH DAILY. 45 tablet 2 06/25/2017 at Unknown time  . warfarin (COUMADIN) 2.5 MG tablet Take 0.5-1 tablets (1.25-2.5 mg total) by mouth daily at 6 PM. Takes 1 tablet on Mon, Wed, Fri, take 0.5 tablet on all other days (Patient taking differently: Take 1.25-2.5 mg by mouth daily at 6 PM. Take 0.5 tablet (1.25 mg) by mouth on Monday, Wednesday, & Friday then Take 1 tablet (2.5 mg) on all other days Sunday, Tuesday, Thursday, & Saturday)   06/25/2017 at 2030    Assessment: INR supratherapeutic.No bleeding reported  Goal of Therapy:  INR 2-3   Plan:  No coumadin today Daily PT/INR Monitor for signs and symptoms of bleeding.   Clearnce Leja Poteet 06/27/2017,9:11 AM

## 2017-06-27 NOTE — Progress Notes (Signed)
Patient has refused a CPAP unit tonight due to constant cough and having to spit constantly. Patient was placed on 3lpm [place] and CPAP was placed on stand-by unit patient is able to wear comfortably. Her saturation on the [place] was 94% HR 104.

## 2017-06-27 NOTE — Progress Notes (Signed)
**Note De-Identified Kaulder Zahner Obfuscation** EKG complete, RN notified of results and placed in patient chart

## 2017-06-27 NOTE — Progress Notes (Signed)
PROGRESS NOTE                                                                                                                                                                                                             Patient Demographics:    [name], is a 72 y.o. female, DOB - [birth year], XBD:[id number]  Admit date - 06/26/2017   Admitting Physician Louellen Molder, MD  Outpatient Primary MD for the patient is Lemmie Evens, MD  LOS - 1  Outpatient Specialists: Dr Aundra Dubin ( cardiology) Dr Joelyn Oms ( renal)  Chief Complaint  Patient presents with  . Shortness of Breath       Brief Narrative   72 y.o. 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 Patient was diagnosed with UTI by her PCP on 12/31 and being treated on abx. Patient admitted for acute on chr systolic CHF and UTI.     Subjective:   breathing slightly better , still has some dysuria.  Assessment  & Plan :   Principal Problem:   Acute on chronic combined systolic and diastolic CHF (congestive heart failure) ([place]) Likely triggered by use recent UTI.  Admit to telemetry. on IV Lasix 60 mg every 12 hours (patient on 40 mg by mouth twice a day at home). Strict I/O and daily weight. Check 2-D echo with EF 20-25% with diffuse hypokinesis. Continue Coreg, lisinopril, Aldactone and statin.  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 Had CRT-D placed in 2014 which was extracted in 2016 due to enteric focal bacteremia and pacemaker lead vegetation. (Prior to that she had Medtronic CRT-D placed in 2005 which was removed in 2007 due to  enterococcal bacteremia) Patient  has not seen Dr. Aundra Dubin since October 2017 at the CHF clinic and on questioning reports she wasn't called from the office. Will arrange f/up upon discharge. Continue medications as outlined      UTI (urinary tract infection) Was started on antibiotic 5 days back, switched to Levaquin based on sensitivity by her PCP on 1/3.  Given her prolonged QTc switched on rocephin ( urine cx from PCP office growing 50-100k ecoli sensitive to Augmentin, cephalosporins and quinolones)     Type 2 diabetes, controlled, with renal manifestation (HCC) Hold Amaryl. Recent A1c of 6.9. Monitor on sliding scale coverage.    Chronic renal disease, stage 4, severely decreased glomerular filtration rate (GFR) between 15-29 mL/min/1.73 square meter (HCC) Mild worsening of renal fn with lasix. . Follows with Dr. Joelyn Oms. Recently had AV graft placed by Dr. Trula Slade.     COPD (chronic obstructive pulmonary disease) ([place]) Stable. Continue home inhalers     Paroxysmal atrial fibrillation (HCC) HR stable. continue  beta blocker and amiodarone.  Coumadin with dosing per pharmacy. Was previously on digoxin which was discontinued due to progressive renal disease.  Prolonged QTC (534) Avoid QT prolonging agents. magnesium normal.       OSA (obstructive sleep apnea)  nighttime CPAP    Obesity (BMI 30-39.9)  Generalized weakness PT evaluation.     Code Status : full code  Family Communication  : husband at bedside  Disposition Plan  : home in 1-2 days  Barriers For Discharge : active symptoms  Consults  :  none  Procedures  : echo  DVT Prophylaxis  :  coumadin  Lab Results  Component Value Date   PLT 242 06/26/2017    Antibiotics  :    Anti-infectives (From admission, onward)   Start     Dose/Rate Route Frequency Ordered Stop   06/26/17 1400  levofloxacin (LEVAQUIN) IVPB 500 mg  Status:  Discontinued     500 mg 100 mL/hr over 60 Minutes  Intravenous Every 48 hours 06/26/17 1346 06/26/17 1349   06/26/17 1400  cefTRIAXone (ROCEPHIN) 1 g in dextrose 5 % 50 mL IVPB     1 g 100 mL/hr over 30 Minutes Intravenous Every 24 hours 06/26/17 1355          Objective:   Vitals:   06/27/17 0500 06/27/17 0600 06/27/17 0924 06/27/17 1300  BP:  106/66  110/62  Pulse:  60  77  Resp:  18  19  Temp:  98.1 F (36.7 C)  98.6 F (37 C)  TempSrc:  Oral  Oral  SpO2:  95% 96% 95%  Weight: 86.5 kg (190 lb 11.2 oz)     Height:        Wt Readings from Last 3 Encounters:  06/27/17 86.5 kg (190 lb 11.2 oz)  06/08/17 86.4 kg (190 lb 8 oz)  05/28/17 86.2 kg (190 lb)     Intake/Output Summary (Last 24 hours) at 06/27/2017 1618 Last data filed at 06/27/2017 1238 Gross per 24 hour  Intake 720 ml  Output -  Net 720 ml     Physical Exam Gen: fatigued, in NAD' HEENT: moist mucosa, no JVD. CHEST: Diminished bibasilar breath sounds CVS: S1&S2 irregular, no murmurs GI: soft, ND, BS+, suprapubic tenderness+  musculoskeletal: warm, no edema     Data Review:    CBC Recent Labs  Lab 06/26/17 0806  WBC 8.0  HGB 10.5*  HCT 34.0*  PLT 242  MCV 100.3*  MCH 31.0  MCHC 30.9  RDW 15.3  LYMPHSABS 1.0  MONOABS 0.6  EOSABS 0.0  BASOSABS 0.0    Chemistries  Recent Labs  Lab 06/26/17 0806 06/27/17 0600  NA 140 139  K 4.4 4.6  CL 105 106  CO2 18* 20*  GLUCOSE 212* 129*  BUN 70* 81*  CREATININE 3.01* 3.38*  CALCIUM 9.9  9.4  MG 2.4  --   AST 32  --   ALT 28  --   ALKPHOS 49  --   BILITOT 1.3*  --    ------------------------------------------------------------------------------------------------------------------ No results for input(s): CHOL, HDL, LDLCALC, TRIG, CHOLHDL, LDLDIRECT in the last 72 hours.  Lab Results  Component Value Date   HGBA1C 6.9 (H) 05/28/2017   ------------------------------------------------------------------------------------------------------------------ No results for input(s): TSH, T4TOTAL,  T3FREE, THYROIDAB in the last 72 hours.  Invalid input(s): FREET3 ------------------------------------------------------------------------------------------------------------------ No results for input(s): VITAMINB12, FOLATE, FERRITIN, TIBC, IRON, RETICCTPCT in the last 72 hours.  Coagulation profile Recent Labs  Lab 06/26/17 0806 06/27/17 0600  INR 2.97 3.97    No results for input(s): DDIMER in the last 72 hours.  Cardiac Enzymes Recent Labs  Lab 06/26/17 0806  TROPONINI 0.05*   ------------------------------------------------------------------------------------------------------------------    Component Value Date/Time   BNP 2,226.0 (H) 06/26/2017 0806    Inpatient Medications  Scheduled Meds: . amiodarone  200 mg Oral Daily  . calcitRIOL  0.25 mcg Oral Daily  . carvedilol  6.25 mg Oral BID WC  . feeding supplement (ENSURE ENLIVE)  237 mL Oral BID BM  . ferrous sulfate  325 mg Oral BID  . furosemide  60 mg Intravenous Q12H  . isosorbide mononitrate  30 mg Oral Daily  . levothyroxine  50 mcg Oral QAC breakfast  . pravastatin  80 mg Oral QHS  . sodium chloride flush  3 mL Intravenous Q12H  . Warfarin - Pharmacist Dosing Inpatient   Does not apply Q24H   Continuous Infusions: . sodium chloride    . cefTRIAXone (ROCEPHIN)  IV Stopped (06/27/17 1445)   PRN Meds:.sodium chloride, acetaminophen **OR** acetaminophen, acetaminophen, colchicine, ondansetron **OR** ondansetron (ZOFRAN) IV, ondansetron, oxyCODONE-acetaminophen **AND** oxyCODONE, polyethylene glycol, sodium chloride flush  Micro Results Recent Results (from the past 240 hour(s))  Urine culture     Status: None   Collection Time: 06/26/17  8:30 AM  Result Value Ref Range Status   Specimen Description URINE, CLEAN CATCH  Final   Special Requests NONE  Final   Culture   Final    NO GROWTH Performed at [facility], [address] [place], [place] [address]    Report Status 06/27/2017 FINAL  Final   MRSA PCR Screening     Status: None   Collection Time: 06/26/17  5:46 PM  Result Value Ref Range Status   MRSA by PCR NEGATIVE NEGATIVE Final    Comment:        The GeneXpert MRSA Assay (FDA approved for NASAL specimens only), is one component of a comprehensive MRSA colonization surveillance program. It is not intended to diagnose MRSA infection nor to guide or monitor treatment for MRSA infections.     Radiology Reports Dg Chest Portable 1 View  Result Date: 06/26/2017 CLINICAL DATA:  Shortness of breath. EXAM: PORTABLE CHEST 1 VIEW COMPARISON:  02/23/2015 FINDINGS: Midline trachea. Cardiomegaly accentuated by AP portable technique. No definite pleural fluid. No pneumothorax. Low lung volumes with resultant pulmonary interstitial prominence. Scarring at the left greater than right lung bases. No lobar consolidation. Advanced degenerative changes of both glenohumeral joints. IMPRESSION: Cardiomegaly and low lung volumes.  No acute findings. Electronically Signed   By: Abigail Miyamoto M.D.   On: 06/26/2017 08:29    Time Spent in minutes  25   Domonique Brouillard M.D on 06/27/2017 at 4:18 PM  Between 7am to 7pm - Pager - [phone]  After 7pm go to [email or web address] -  password [facility]  [facility] -  Office  [phone]

## 2017-06-28 LAB — PROTIME-INR
INR: 3.51
Prothrombin Time: 34.9 seconds — ABNORMAL HIGH (ref 11.4–15.2)

## 2017-06-28 LAB — BASIC METABOLIC PANEL
Anion gap: 13 (ref 5–15)
BUN: 93 mg/dL — AB (ref 6–20)
CALCIUM: 9.5 mg/dL (ref 8.9–10.3)
CO2: 21 mmol/L — AB (ref 22–32)
Chloride: 105 mmol/L (ref 101–111)
Creatinine, Ser: 3.51 mg/dL — ABNORMAL HIGH (ref 0.44–1.00)
GFR calc Af Amer: 14 mL/min — ABNORMAL LOW (ref >60)
GFR, EST NON AFRICAN AMERICAN: 12 mL/min — AB (ref >60)
GLUCOSE: 119 mg/dL — AB (ref 65–99)
POTASSIUM: 4.4 mmol/L (ref 3.5–5.1)
Sodium: 139 mmol/L (ref 135–145)

## 2017-06-28 MED ORDER — FUROSEMIDE 10 MG/ML IJ SOLN
80.0000 mg | Freq: Two times a day (BID) | INTRAMUSCULAR | Status: DC
  Administered 2017-06-28 – 2017-06-30 (×5): 80 mg via INTRAVENOUS
  Filled 2017-06-28 (×5): qty 8

## 2017-06-28 NOTE — Progress Notes (Signed)
PROGRESS NOTE                                                                                                                                                                                                             Patient Demographics:    [name], is a 72 y.o. female, DOB - 1945/11/28, QIW:[id number]  Admit date - 06/26/2017   Admitting Physician Louellen Molder, MD  Outpatient Primary MD for the patient is Lemmie Evens, MD  LOS - 2  Outpatient Specialists: Dr Aundra Dubin ( cardiology) Dr Joelyn Oms ( renal)  Chief Complaint  Patient presents with  . Shortness of Breath       Brief Narrative   72 y.o. 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 Patient was diagnosed with UTI by her PCP on 12/31 and being treated on abx. Patient admitted for acute on chr systolic CHF and UTI.     Subjective:   Report breathing to be slightly better but still has positive balance. Denies further abdominal pain or dysuria.  Assessment  & Plan :   Principal Problem:   Acute on chronic combined systolic and diastolic CHF (congestive heart failure) (HCC) Likely triggered by use recent UTI.   still has a positive balance. I would increase her Lasix dose to 80 mg every 12 hours. Strict I/O and daily weight. Check 2-D echo with EF 20-25% with diffuse hypokinesis. Continue Coreg, lisinopril, Aldactone and statin.  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 Had CRT-D placed in 2014 which was extracted in 2016 due to enteric focal bacteremia and pacemaker lead vegetation. (Prior to that she had Medtronic CRT-D placed  in 2005 which was removed in 2007 due to enterococcal bacteremia) Patient  has not seen Dr. Aundra Dubin since October 2017 at the CHF clinic and on questioning reports she wasn't called from the office. Will arrange f/up upon discharge. Continue medications as outlined above.     UTI (urinary tract infection) Was started on antibiotic 5 days back, switched to Levaquin based on sensitivity by  her PCP on 1/3.  Given her prolonged QTc switched on rocephin ( urine cx from PCP office growing 50-100k ecoli sensitive to Augmentin, cephalosporins and quinolones) Will complete 7 day course of antibiotics today and will discontinue. Urinary symptoms resolved.     Type 2 diabetes, controlled, with renal manifestation (HCC) Hold Amaryl. Recent A1c of 6.9. Monitor on sliding scale coverage.    Chronic renal disease, stage 4, severely decreased glomerular filtration rate (GFR) between 15-29 mL/min/1.73 square meter (HCC) Mild worsening of renal fn with lasix. . Follows with Dr. Joelyn Oms. Recently had AV graft placed by Dr. Trula Slade.     COPD (chronic obstructive pulmonary disease) ([place]) Stable. Continue home inhalers     Paroxysmal atrial fibrillation (HCC) HR stable. continue  beta blocker and amiodarone.  Coumadin with dosing per pharmacy. Was previously on digoxin which was discontinued due to progressive renal disease.  Prolonged QTC (534) Avoid QT prolonging agents. magnesium normal.       OSA (obstructive sleep apnea)  nighttime CPAP    Obesity (BMI 30-39.9)  Generalized weakness PT evaluation.     Code Status : full code  Family Communication  : None at bedside today  Disposition Plan  : home in 1-2 days if dyspnea improved  Barriers For Discharge : active symptoms  Consults  :  none  Procedures  : echo  DVT Prophylaxis  :  coumadin  Lab Results  Component Value Date   PLT 242 06/26/2017    Antibiotics  :    Anti-infectives (From admission, onward)   Start      Dose/Rate Route Frequency Ordered Stop   06/26/17 1400  levofloxacin (LEVAQUIN) IVPB 500 mg  Status:  Discontinued     500 mg 100 mL/hr over 60 Minutes Intravenous Every 48 hours 06/26/17 1346 06/26/17 1349   06/26/17 1400  cefTRIAXone (ROCEPHIN) 1 g in dextrose 5 % 50 mL IVPB     1 g 100 mL/hr over 30 Minutes Intravenous Every 24 hours 06/26/17 1355          Objective:   Vitals:   06/27/17 0924 06/27/17 1300 06/27/17 2046 06/28/17 0525  BP:  110/62 99/66 (!) 88/59  Pulse:  77 72 93  Resp:  19 20 20   Temp:  98.6 F (37 C) (!) 97.5 F (36.4 C) (!) 97.4 F (36.3 C)  TempSrc:  Oral Oral Oral  SpO2: 96% 95% 100% 95%  Weight:    87.3 kg (192 lb 7.4 oz)  Height:        Wt Readings from Last 3 Encounters:  06/28/17 87.3 kg (192 lb 7.4 oz)  06/08/17 86.4 kg (190 lb 8 oz)  05/28/17 86.2 kg (190 lb)     Intake/Output Summary (Last 24 hours) at 06/28/2017 1034 Last data filed at 06/28/2017 0528 Gross per 24 hour  Intake 723 ml  Output 600 ml  Net 123 ml     Physical Exam Gen.: Not in distress HEENT: Moist mucosa, supple neck Chest: Fine bibasilar crackles CVS: S1 and S2 irregular, no murmurs GI: Soft, nondistended, nontender, bowel sounds present Musculoskeletal: Warm, no edema      Data Review:    CBC Recent Labs  Lab 06/26/17 0806  WBC 8.0  HGB 10.5*  HCT 34.0*  PLT 242  MCV 100.3*  MCH 31.0  MCHC 30.9  RDW 15.3  LYMPHSABS 1.0  MONOABS 0.6  EOSABS 0.0  BASOSABS 0.0    Chemistries  Recent Labs  Lab 06/26/17 0806 06/27/17  0600 06/28/17 0611  NA 140 139 139  K 4.4 4.6 4.4  CL 105 106 105  CO2 18* 20* 21*  GLUCOSE 212* 129* 119*  BUN 70* 81* 93*  CREATININE 3.01* 3.38* 3.51*  CALCIUM 9.9 9.4 9.5  MG 2.4  --   --   AST 32  --   --   ALT 28  --   --   ALKPHOS 49  --   --   BILITOT 1.3*  --   --    ------------------------------------------------------------------------------------------------------------------ No results for input(s):  CHOL, HDL, LDLCALC, TRIG, CHOLHDL, LDLDIRECT in the last 72 hours.  Lab Results  Component Value Date   HGBA1C 6.9 (H) 05/28/2017   ------------------------------------------------------------------------------------------------------------------ No results for input(s): TSH, T4TOTAL, T3FREE, THYROIDAB in the last 72 hours.  Invalid input(s): FREET3 ------------------------------------------------------------------------------------------------------------------ No results for input(s): VITAMINB12, FOLATE, FERRITIN, TIBC, IRON, RETICCTPCT in the last 72 hours.  Coagulation profile Recent Labs  Lab 06/26/17 0806 06/27/17 0600 06/28/17 0611  INR 2.97 3.97 3.51    No results for input(s): DDIMER in the last 72 hours.  Cardiac Enzymes Recent Labs  Lab 06/26/17 0806  TROPONINI 0.05*   ------------------------------------------------------------------------------------------------------------------    Component Value Date/Time   BNP 2,226.0 (H) 06/26/2017 0806    Inpatient Medications  Scheduled Meds: . amiodarone  200 mg Oral Daily  . calcitRIOL  0.25 mcg Oral Daily  . carvedilol  6.25 mg Oral BID WC  . feeding supplement (ENSURE ENLIVE)  237 mL Oral BID BM  . ferrous sulfate  325 mg Oral BID  . furosemide  80 mg Intravenous Q12H  . isosorbide mononitrate  30 mg Oral Daily  . levothyroxine  50 mcg Oral QAC breakfast  . pravastatin  80 mg Oral QHS  . sodium chloride flush  3 mL Intravenous Q12H  . Warfarin - Pharmacist Dosing Inpatient   Does not apply Q24H   Continuous Infusions: . sodium chloride    . cefTRIAXone (ROCEPHIN)  IV Stopped (06/27/17 1445)   PRN Meds:.sodium chloride, acetaminophen **OR** acetaminophen, acetaminophen, colchicine, ondansetron **OR** ondansetron (ZOFRAN) IV, ondansetron, oxyCODONE-acetaminophen **AND** oxyCODONE, polyethylene glycol, sodium chloride flush  Micro Results Recent Results (from the past 240 hour(s))  Urine culture      Status: None   Collection Time: 06/26/17  8:30 AM  Result Value Ref Range Status   Specimen Description URINE, CLEAN CATCH  Final   Special Requests NONE  Final   Culture   Final    NO GROWTH Performed at [facility], [address] [place], [place] [address]    Report Status 06/27/2017 FINAL  Final  MRSA PCR Screening     Status: None   Collection Time: 06/26/17  5:46 PM  Result Value Ref Range Status   MRSA by PCR NEGATIVE NEGATIVE Final    Comment:        The GeneXpert MRSA Assay (FDA approved for NASAL specimens only), is one component of a comprehensive MRSA colonization surveillance program. It is not intended to diagnose MRSA infection nor to guide or monitor treatment for MRSA infections.     Radiology Reports Dg Chest Portable 1 View  Result Date: 06/26/2017 CLINICAL DATA:  Shortness of breath. EXAM: PORTABLE CHEST 1 VIEW COMPARISON:  02/23/2015 FINDINGS: Midline trachea. Cardiomegaly accentuated by AP portable technique. No definite pleural fluid. No pneumothorax. Low lung volumes with resultant pulmonary interstitial prominence. Scarring at the left greater than right lung bases. No lobar consolidation. Advanced degenerative changes of both glenohumeral  joints. IMPRESSION: Cardiomegaly and low lung volumes.  No acute findings. Electronically Signed   By: Abigail Miyamoto M.D.   On: 06/26/2017 08:29    Time Spent in minutes  25   Latifa Noble M.D on 06/28/2017 at 10:34 AM  Between 7am to 7pm - Pager - [phone]  After 7pm go to [email or web address] - password [facility]  [facility] -  Office  [phone]

## 2017-06-28 NOTE — Progress Notes (Signed)
[place] for Warfarin (home med) Indication: atrial fibrillation  No Known Allergies  Patient Measurements: Height: 5\' 5"  (165.1 cm) Weight: 192 lb 7.4 oz (87.3 kg) IBW/kg (Calculated) : 57 Heparin Dosing Weight:   Vital Signs: Temp: 97.4 F (36.3 C) (01/06 0525) Temp Source: Oral (01/06 0525) BP: 88/59 (01/06 0525) Pulse Rate: 93 (01/06 0525)  Labs: Recent Labs    06/26/17 0806 06/27/17 0600 06/28/17 0611  HGB 10.5*  --   --   HCT 34.0*  --   --   PLT 242  --   --   LABPROT 30.6* 38.5* 34.9*  INR 2.97 3.97 3.51  CREATININE 3.01* 3.38* 3.51*  TROPONINI 0.05*  --   --     Estimated Creatinine Clearance: 15.6 mL/min (A) (by C-G formula based on SCr of 3.51 mg/dL (H)).   Medical History: Past Medical History:  Diagnosis Date  . Anemia    a. mild/chronic  . Anemia due to GI blood loss 02/26/2016  . Anemia in chronic renal disease 11/30/2014  . Cardiomyopathy, nonischemic ([place])    a. 1999 nl cath;  b. 12/05 [facility];  c. 10/2005 ICD extraction 2/2 enterococcus bacteremia and Veg on RV lead;  c. 05/2008 low risk Myoview (scarring w/ some evidence of inf ischemia);  d. 11/2012 Echo: EF 15-20%;  e. 01/2013 s/p MDT [place] CRT D, ser # [id number];  f. 05/2013 Echo: EF 15%.  . Chronic systolic CHF (congestive heart failure) ([place])    a. 11/2012 Echo: EF 15-20%;  b. 05/2013 TEE EF 15%.  . CKD (chronic kidney disease), stage III ([place])    creatinin-1.44 in 1/09; 1.51 in 1/10  . Degenerative joint disease    of knees, shoulder, and hips  . Enterococcal infection    a. 10/2005 - AICD-explanted  . GERD (gastroesophageal reflux disease)   . Hilar density    a. infrahilar mass/adenopathy on CT scan 5/07; subsequently  resolved  . History of blood transfusion   . Hyperlipidemia   . Hypertension   . Hypothyroidism   . ICD (implantable cardioverter-defibrillator) infection ([place])    removed 2016  . Implantable  cardioverter-defibrillator-CRT- Mdt    a.  01/2013 s/p MDT [place] CRT D, ser # [id number] - ICD has been removed  . LBBB (left bundle branch block)   . Obstructive sleep apnea    uses cpap  . PAF (paroxysmal atrial fibrillation) ([place])    a. 07/2012 s/p TEE/DCCV;  b. chronic coumadin;  c. 05/2013 Recurrent Afib->TEE/DCCV and amio initiation.  . Peripheral vascular disease ([place])   . Pneumonia 07/2005  . Pulmonary embolism ([place])    a. 07/2005 after total right hip arthroplasty  . Tobacco abuse    a. discontinued in 1997, and then resumed  . Type II diabetes mellitus ([place])    type 2  . Urinary incontinence   . Villous adenoma of colon    a. tubovillous adenomatous polyp with focal high grade dysplasia; presented with hematochezia - followed by Dr. Laural Golden.   Medications:  Medications Prior to Admission  Medication Sig Dispense Refill Last Dose  . amiodarone (PACERONE) 200 MG tablet Take 200 mg by mouth daily.    06/25/2017 at Unknown time  . calcitRIOL (ROCALTROL) 0.25 MCG capsule Take 0.25 mcg by mouth daily.   06/25/2017 at Unknown time  . carvedilol (COREG) 6.25 MG tablet TAKE 1 TABLET BY MOUTH TWICE DAILY WITH A MEAL. 60 tablet 0 06/25/2017 at 2030  .  colchicine 0.6 MG tablet Take 0.6 mg by mouth 2 (two) times daily as needed (gout). Reported on 11/06/2015   unknown  . feeding supplement, ENSURE ENLIVE, (ENSURE ENLIVE) LIQD Take 237 mLs by mouth 2 (two) times daily between meals. (Patient taking differently: Take 237 mLs by mouth daily with lunch. ) 237 mL 12 Past Week at Unknown time  . ferrous sulfate 324 (65 FE) MG TBEC Take 1 tablet (325 mg total) by mouth 2 (two) times daily. 60 tablet  06/25/2017 at Unknown time  . furosemide (LASIX) 40 MG tablet TAKE 1 TABLET BY MOUTH TWICE DAILY. 60 tablet 3 06/25/2017 at Unknown time  . glimepiride (AMARYL) 2 MG tablet Take 2 mg by mouth daily with breakfast.    06/25/2017 at Unknown time  . isosorbide mononitrate (IMDUR) 30 MG 24 hr tablet Take 1 tablet (30 mg  total) by mouth daily. 30 tablet 0 06/25/2017 at Unknown time  . levofloxacin (LEVAQUIN) 250 MG tablet Take 250 mg by mouth every other day. Take  2 tablets on day one and then one tablet every other day For 10 days   06/25/2017 at Unknown time  . levothyroxine (SYNTHROID, LEVOTHROID) 50 MCG tablet Take 50 mcg by mouth daily before breakfast.   06/25/2017 at Unknown time  . lisinopril (PRINIVIL,ZESTRIL) 10 MG tablet TAKE ONE TABLET BY MOUTH DAILY. (Patient taking differently: TAKE ONE-HALF TABLET BY MOUTH DAILY.) 30 tablet 0 06/25/2017 at Unknown time  . ondansetron (ZOFRAN) 4 MG tablet Take 4 mg by mouth every 4 (four) hours as needed for nausea or vomiting. Reported on 11/06/2015   06/25/2017 at Unknown time  . oxyCODONE-acetaminophen (PERCOCET) 10-325 MG per tablet Take 1 tablet by mouth every 6 (six) hours as needed for pain.    Past Week at Unknown time  . polyethylene glycol powder (GLYCOLAX/MIRALAX) powder Take 17 g by mouth daily as needed (for constipation.).    unknown  . pravastatin (PRAVACHOL) 40 MG tablet Take 80 mg by mouth at bedtime.    06/25/2017 at Unknown time  . spironolactone (ALDACTONE) 25 MG tablet TAKE 1/2 TABLET BY MOUTH DAILY. 45 tablet 2 06/25/2017 at Unknown time  . warfarin (COUMADIN) 2.5 MG tablet Take 0.5-1 tablets (1.25-2.5 mg total) by mouth daily at 6 PM. Takes 1 tablet on Mon, Wed, Fri, take 0.5 tablet on all other days (Patient taking differently: Take 1.25-2.5 mg by mouth daily at 6 PM. Take 0.5 tablet (1.25 mg) by mouth on Monday, Wednesday, & Friday then Take 1 tablet (2.5 mg) on all other days Sunday, Tuesday, Thursday, & Saturday)   06/25/2017 at 2030    Assessment: INR supratherapeutic.No bleeding reported  Goal of Therapy:  INR 2-3   Plan:  No coumadin today Daily PT/INR Monitor for signs and symptoms of bleeding.   Joseeduardo Brix, [place] 06/28/2017,10:07 AM

## 2017-06-29 ENCOUNTER — Encounter: Payer: Medicare HMO | Admitting: Surgery

## 2017-06-29 DIAGNOSIS — K625 Hemorrhage of anus and rectum: Secondary | ICD-10-CM

## 2017-06-29 DIAGNOSIS — N179 Acute kidney failure, unspecified: Secondary | ICD-10-CM

## 2017-06-29 LAB — BASIC METABOLIC PANEL
Anion gap: 13 (ref 5–15)
BUN: 95 mg/dL — AB (ref 6–20)
CO2: 22 mmol/L (ref 22–32)
CREATININE: 3.57 mg/dL — AB (ref 0.44–1.00)
Calcium: 9 mg/dL (ref 8.9–10.3)
Chloride: 101 mmol/L (ref 101–111)
GFR calc non Af Amer: 12 mL/min — ABNORMAL LOW (ref >60)
GFR, EST AFRICAN AMERICAN: 14 mL/min — AB (ref >60)
Glucose, Bld: 156 mg/dL — ABNORMAL HIGH (ref 65–99)
POTASSIUM: 4 mmol/L (ref 3.5–5.1)
SODIUM: 136 mmol/L (ref 135–145)

## 2017-06-29 LAB — PROTIME-INR
INR: 2.44
PROTHROMBIN TIME: 26.3 s — AB (ref 11.4–15.2)

## 2017-06-29 LAB — HEMOGLOBIN AND HEMATOCRIT, BLOOD
HEMATOCRIT: 33.6 % — AB (ref 36.0–46.0)
Hemoglobin: 10.5 g/dL — ABNORMAL LOW (ref 12.0–15.0)

## 2017-06-29 MED ORDER — WARFARIN SODIUM 1 MG PO TABS
1.0000 mg | ORAL_TABLET | Freq: Once | ORAL | Status: AC
  Administered 2017-06-29: 1 mg via ORAL
  Filled 2017-06-29: qty 1

## 2017-06-29 MED ORDER — GUAIFENESIN-DM 100-10 MG/5ML PO SYRP
5.0000 mL | ORAL_SOLUTION | ORAL | Status: DC | PRN
  Administered 2017-06-29 – 2017-06-30 (×2): 5 mL via ORAL
  Filled 2017-06-29 (×2): qty 5

## 2017-06-29 MED ORDER — GUAIFENESIN ER 600 MG PO TB12
600.0000 mg | ORAL_TABLET | Freq: Two times a day (BID) | ORAL | Status: DC
  Administered 2017-06-29 – 2017-06-30 (×3): 600 mg via ORAL
  Filled 2017-06-29 (×3): qty 1

## 2017-06-29 NOTE — Plan of Care (Signed)
  Acute Rehab PT Goals(only PT should resolve) Pt Will Go Supine/Side To Sit 06/29/2017 1118 - Progressing by Lonell Grandchild, PT Flowsheets Taken 06/29/2017 1118  Pt will go Supine/Side to Sit with supervision Patient Will Transfer Sit To/From Stand 06/29/2017 1118 - Progressing by Lonell Grandchild, PT Flowsheets Taken 06/29/2017 1118  Patient will transfer sit to/from stand with supervision Pt Will Transfer Bed To Chair/Chair To Bed 06/29/2017 1118 - Progressing by Lonell Grandchild, PT Flowsheets Taken 06/29/2017 1118  Pt will Transfer Bed to Chair/Chair to Bed with supervision Pt Will Ambulate 06/29/2017 1118 - Progressing by Lonell Grandchild, PT Flowsheets Taken 06/29/2017 1118  Pt will Ambulate 50 feet;with supervision;with rolling walker   11:19 AM, 06/29/17 Lonell Grandchild, MPT Physical Therapist with [facility] [phone] office [phone] mobile phone

## 2017-06-29 NOTE — Progress Notes (Signed)
Pt has small BM this morning. Bright red blood noted in BSC. Pt states this has happened to her before when she was constipated. Pt c/o constipation at this time. Miralax given per PRN order. Pt also complains of cough, no PRN's ordered at this time. Dr. Clementeen Graham paged and made aware of the above.

## 2017-06-29 NOTE — Progress Notes (Signed)
PROGRESS NOTE                                                                                                                                                                                                             Patient Demographics:    [name], is a 72 y.o. female, DOB - 03/07/1946, JYN:[id number]  Admit date - 06/26/2017   Admitting Physician Louellen Molder, MD  Outpatient Primary MD for the patient is Lemmie Evens, MD  LOS - 3  Outpatient Specialists: Dr Aundra Dubin ( cardiology) Dr Joelyn Oms ( renal)  Chief Complaint  Patient presents with  . Shortness of Breath       Brief Narrative   72 y.o. 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 Patient was diagnosed with UTI by her PCP on 12/31 and being treated on abx. Patient admitted for acute on chr systolic CHF and UTI.     Subjective:   Breathing continues to improve but feels weak and constipated. Had a small bowel movement this morning mixed with bright red blood and reports that this happens when she gets constipated and has to strain.  Assessment  & Plan :   Principal Problem:   Acute on chronic combined systolic and diastolic CHF (congestive heart failure) (HCC) Likely triggered by use recent UTI.   has good urine output which is not documented properly. Continue strict I/O and daily weight. Continue IV Lasix 80 mg twice a day. On adrenal function. Check 2-D echo with EF 20-25% with diffuse hypokinesis. Continue Coreg,and statin. Holding lisinopril and Aldactone given worsened renal function.  Active Problems: Nonischemic cardiomyopathy EF of 20% as per 2-D echo in  02/2016. Had CRT-D placed in 2014 which was extracted in 2016 due to enteric focal bacteremia and pacemaker lead vegetation. (Prior to that she had Med[employer] CRT-D placed in 2005 which was removed in 2007 due to enterococcal bacteremia) Patient  has not seen Dr. Aundra Dubin since October 2017 at the CHF clinic and on questioning reports she wasn't called from the office. Will arrange f/up upon discharge. Continue  medications as outlined above.     UTI (urinary tract infection) Urine culture from PCP office going 50-100 K Escherichia coli sensitive to all the antibiotics is received (Keflex, Levaquin and Rocephin). Complete seven-day course of antibiotics after today.  Bright red blood per rectum Suspect hemorrhoidal bleed with straining. Ordered MiraLAX. H&H stable. Monitor for now.     acute on Chronic renal disease, stage 4, severely decreased glomerular filtration rate (GFR) between 15-29 mL/min/1.73 square meter (HCC) Worsened renal function with IV Lasix. Hold lisinopril and Aldactone.. Follows with Dr. Joelyn Oms. Recently had AV graft placed by Dr. Trula Slade.     Type 2 diabetes, controlled, with renal manifestation ([place]) Holding Amaryl. Recent A1c of 6.9. Monitor on sliding scale coverage.      COPD (chronic obstructive pulmonary disease) (HCC) Stable. Continue home inhalers     Paroxysmal atrial fibrillation (HCC) HR stable. continue  beta blocker and amiodarone.  Coumadin with dosing per pharmacy. Was previously on digoxin which was discontinued due to progressive renal disease.  Prolonged QTC (534) Avoid QT prolonging agents. magnesium normal.       OSA (obstructive sleep apnea)  nighttime CPAP    Generalized weakness PT recommends home health with supervision     Code Status : full code  Family Communication  : Husband and daughter at bedside  Disposition Plan  : home tomorrow if dyspnea improved and no further rectal bleed.  Barriers For Discharge :  active symptoms  Consults  :  none  Procedures  : echo  DVT Prophylaxis  :  coumadin  Lab Results  Component Value Date   PLT 242 06/26/2017    Antibiotics  :    Anti-infectives (From admission, onward)   Start     Dose/Rate Route Frequency Ordered Stop   06/26/17 1400  levofloxacin (LEVAQUIN) IVPB 500 mg  Status:  Discontinued     500 mg 100 mL/hr over 60 Minutes Intravenous Every 48 hours 06/26/17 1346 06/26/17 1349   06/26/17 1400  cefTRIAXone (ROCEPHIN) 1 g in dextrose 5 % 50 mL IVPB     1 g 100 mL/hr over 30 Minutes Intravenous Every 24 hours 06/26/17 1355          Objective:   Vitals:   06/28/17 2115 06/29/17 0520 06/29/17 0908 06/29/17 0909  BP: (!) 90/47 96/66    Pulse: 82 80    Resp: 18 20    Temp: 98.2 F (36.8 C) 98.2 F (36.8 C)    TempSrc: Oral Oral    SpO2: 100% 100% 99% 96%  Weight:  86.8 kg (191 lb 5.8 oz)    Height:        Wt Readings from Last 3 Encounters:  06/29/17 86.8 kg (191 lb 5.8 oz)  06/08/17 86.4 kg (190 lb 8 oz)  05/28/17 86.2 kg (190 lb)     Intake/Output Summary (Last 24 hours) at 06/29/2017 1132 Last data filed at 06/29/2017 0904 Gross per 24 hour  Intake 480 ml  Output 1250 ml  Net -770 ml     Physical Exam Gen.: Elderly female not in distress, appears fatigued HEENT: Moist mucosa, supple neck, no JVD Chest: Improved bibasilar crackles CVS: S1 and S2 irregular, no murmurs GI: Soft, nondistended, nontender, bowel sounds present Musculoskeletal : Warm, no edema      Data Review:    CBC Recent Labs  Lab 06/26/17 0806 06/29/17 0536  WBC 8.0  --   HGB 10.5* 10.5*  HCT 34.0* 33.6*  PLT 242  --  MCV 100.3*  --   MCH 31.0  --   MCHC 30.9  --   RDW 15.3  --   LYMPHSABS 1.0  --   MONOABS 0.6  --   EOSABS 0.0  --   BASOSABS 0.0  --     Chemistries  Recent Labs  Lab 06/26/17 0806 06/27/17 0600 06/28/17 0611 06/29/17 0536  NA 140 139 139 136  K 4.4 4.6 4.4 4.0  CL 105 106 105 101  CO2 18* 20* 21* 22    GLUCOSE 212* 129* 119* 156*  BUN 70* 81* 93* 95*  CREATININE 3.01* 3.38* 3.51* 3.57*  CALCIUM 9.9 9.4 9.5 9.0  MG 2.4  --   --   --   AST 32  --   --   --   ALT 28  --   --   --   ALKPHOS 49  --   --   --   BILITOT 1.3*  --   --   --    ------------------------------------------------------------------------------------------------------------------ No results for input(s): CHOL, HDL, LDLCALC, TRIG, CHOLHDL, LDLDIRECT in the last 72 hours.  Lab Results  Component Value Date   HGBA1C 6.9 (H) 05/28/2017   ------------------------------------------------------------------------------------------------------------------ No results for input(s): TSH, T4TOTAL, T3FREE, THYROIDAB in the last 72 hours.  Invalid input(s): FREET3 ------------------------------------------------------------------------------------------------------------------ No results for input(s): VITAMINB12, FOLATE, FERRITIN, TIBC, IRON, RETICCTPCT in the last 72 hours.  Coagulation profile Recent Labs  Lab 06/26/17 0806 06/27/17 0600 06/28/17 0611 06/29/17 0536  INR 2.97 3.97 3.51 2.44    No results for input(s): DDIMER in the last 72 hours.  Cardiac Enzymes Recent Labs  Lab 06/26/17 0806  TROPONINI 0.05*   ------------------------------------------------------------------------------------------------------------------    Component Value Date/Time   BNP 2,226.0 (H) 06/26/2017 0806    Inpatient Medications  Scheduled Meds: . amiodarone  200 mg Oral Daily  . calcitRIOL  0.25 mcg Oral Daily  . carvedilol  6.25 mg Oral BID WC  . feeding supplement (ENSURE ENLIVE)  237 mL Oral BID BM  . ferrous sulfate  325 mg Oral BID  . furosemide  80 mg Intravenous Q12H  . guaiFENesin  600 mg Oral BID  . isosorbide mononitrate  30 mg Oral Daily  . levothyroxine  50 mcg Oral QAC breakfast  . pravastatin  80 mg Oral QHS  . sodium chloride flush  3 mL Intravenous Q12H  . Warfarin - Pharmacist Dosing Inpatient    Does not apply Q24H   Continuous Infusions: . sodium chloride    . cefTRIAXone (ROCEPHIN)  IV Stopped (06/28/17 1529)   PRN Meds:.sodium chloride, acetaminophen **OR** acetaminophen, acetaminophen, colchicine, guaiFENesin-dextromethorphan, ondansetron **OR** ondansetron (ZOFRAN) IV, ondansetron, oxyCODONE-acetaminophen **AND** oxyCODONE, polyethylene glycol, sodium chloride flush  Micro Results Recent Results (from the past 240 hour(s))  Urine culture     Status: None   Collection Time: 06/26/17  8:30 AM  Result Value Ref Range Status   Specimen Description URINE, CLEAN CATCH  Final   Special Requests NONE  Final   Culture   Final    NO GROWTH Performed at [facility], [address], [place], [place] [address]    Report Status 06/27/2017 FINAL  Final  MRSA PCR Screening     Status: None   Collection Time: 06/26/17  5:46 PM  Result Value Ref Range Status   MRSA by PCR NEGATIVE NEGATIVE Final    Comment:        The GeneXpert MRSA Assay (FDA approved for NASAL specimens only),  is one component of a comprehensive MRSA colonization surveillance program. It is not intended to diagnose MRSA infection nor to guide or monitor treatment for MRSA infections.     Radiology Reports Dg Chest Portable 1 View  Result Date: 06/26/2017 CLINICAL DATA:  Shortness of breath. EXAM: PORTABLE CHEST 1 VIEW COMPARISON:  02/23/2015 FINDINGS: Midline trachea. Cardiomegaly accentuated by AP portable technique. No definite pleural fluid. No pneumothorax. Low lung volumes with resultant pulmonary interstitial prominence. Scarring at the left greater than right lung bases. No lobar consolidation. Advanced degenerative changes of both glenohumeral joints. IMPRESSION: Cardiomegaly and low lung volumes.  No acute findings. Electronically Signed   By: Abigail Miyamoto M.D.   On: 06/26/2017 08:29    Time Spent in minutes  25   Davidlee Jeanbaptiste M.D on 06/29/2017 at 11:32 AM  Between 7am to 7pm - Pager -  [phone]  After 7pm go to [email or web address] - password [facility]  [facility] -  Office  [phone]

## 2017-06-29 NOTE — Evaluation (Addendum)
Physical Therapy Evaluation Patient Details Name: [name] MRN: [id number] DOB: 06-12-1946 Today's Date: 06/29/2017   History of Present Illness   [name]  is a 72 y.o. 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 Patient was diagnosed with UTI by her PCP on 12/31 and was being treated with cefuroxime 500 mg twice a day but since her symptoms did not improve she was switched to Levaquin yesterday. The urine culture was available to PCP today and grew 50-100 take Escherichia coli which is sensitive to cephalosporins, quinolone, Zosyn, Bactrim and Augmentin.    Clinical Impression  Patient limited for functional mobility as stated below secondary to BLE weakness, fatigue and fair/poor standing balance.  Patient on room air during gait training with O2 sats between 95-98%. Patient will benefit from continued physical therapy in hospital and recommended venue below to increase strength, balance, endurance for safe ADLs and gait.    Follow Up Recommendations Home health PT;Supervision - Intermittent    Equipment Recommendations  None recommended by PT    Recommendations for Other Services       Precautions / Restrictions Precautions Precautions: Fall Restrictions Weight Bearing Restrictions: No      Mobility  Bed Mobility Overal bed mobility: Needs Assistance Bed Mobility: Supine to Sit;Sit to Supine     Supine to sit: Min guard Sit to supine: Min assist   General bed mobility comments: requires help to move BLE when getting back in bed  Transfers Overall transfer level: Needs assistance Equipment used: Rolling walker (2 wheeled) Transfers: Sit to/from Stand Sit to Stand: Min guard             Ambulation/Gait Ambulation/Gait assistance: Min guard Ambulation Distance (Feet): 25 Feet Assistive device: Rolling walker (2 wheeled) Gait Pattern/deviations: Decreased step length - right;Decreased step length - left;Decreased stride length   Gait velocity interpretation: Below normal speed for age/gender General Gait Details: demonstrates slow labored movement, slightly unsteady, no loss of balance, limited secondary to c/o fatigue  Stairs            Wheelchair Mobility    Modified Rankin (Stroke Patients Only)       Balance Overall balance assessment: Needs assistance Sitting-balance support: No upper extremity supported;Feet supported Sitting balance-Leahy Scale: Good     Standing balance support: Bilateral upper extremity supported;During functional activity Standing balance-Leahy Scale: Fair                               Pertinent Vitals/Pain Pain Assessment: No/denies pain    Home Living Family/patient expects to be discharged to:: Private residence Living Arrangements: Spouse/significant other Available Help at Discharge: Family Type of Home: House Home Access: Stairs to enter Entrance Stairs-Rails: Left;Right;Can reach both Technical brewer of Steps: 3 Home Layout: One level Home Equipment: Environmental consultant - 4 wheels;Cane - single point;Wheelchair - [employer];Shower seat;Electric scooter      Prior Function Level of Independence: Independent with assistive device(s)         Comments: uses SPC PRN or 4 wheeled walker for longer distances     Hand Dominance        Extremity/Trunk Assessment   Upper Extremity  Assessment Upper Extremity Assessment: Generalized weakness    Lower Extremity Assessment Lower Extremity Assessment: Generalized weakness    Cervical / Trunk Assessment Cervical / Trunk Assessment: Normal  Communication   Communication: No difficulties  Cognition Arousal/Alertness:  Awake/alert Behavior During Therapy: WFL for tasks assessed/performed Overall Cognitive Status: Within Functional Limits for tasks assessed                                        General Comments      Exercises     Assessment/Plan    PT Assessment Patient needs continued PT services  PT Problem List Decreased strength;Decreased activity tolerance;Decreased balance;Decreased mobility       PT Treatment Interventions Gait training;Stair training;Functional mobility training;Therapeutic activities;Therapeutic exercise;Patient/family education    PT Goals (Current goals can be found in the Care Plan section)  Acute Rehab PT Goals PT Goal Formulation: With patient/family Time For Goal Achievement: 07/02/17 Potential to Achieve Goals: Good    Frequency Min 3X/week   Barriers to discharge        Co-evaluation               AM-PAC PT "6 Clicks" Daily Activity  Outcome Measure Difficulty turning over in bed (including adjusting bedclothes, sheets and blankets)?: None Difficulty moving from lying on back to sitting on the side of the bed? : A Little Difficulty sitting down on and standing up from a chair with arms (e.g., wheelchair, bedside commode, etc,.)?: A Little Help needed moving to and from a bed to chair (including a wheelchair)?: A Little Help needed walking in hospital room?: A Little Help needed climbing 3-5 steps with a railing? : A Lot 6 Click Score: 18    End of Session   Activity Tolerance: Patient tolerated treatment well;Patient limited by fatigue Patient left: in bed;with call bell/phone within reach;with family/visitor present Nurse Communication: Mobility status PT Visit Diagnosis: Unsteadiness on feet (R26.81);Other abnormalities of gait and mobility (R26.89);Muscle weakness (generalized) (M62.81)    Time: 9381-8299 PT Time Calculation (min) (ACUTE ONLY): 27 min   Charges:   PT Evaluation $PT Eval Moderate Complexity: 1  Mod PT Treatments $Therapeutic Activity: 23-37 mins   PT G Codes:        11:16 AM, 2017-07-21 Lonell Grandchild, MPT Physical Therapist with [facility] [phone] office [phone] mobile phone

## 2017-06-29 NOTE — Progress Notes (Signed)
Patient continues wear a nasal cannula at 2lpm [place] and refuses to wear a CPAP at this time;will continue to monitor patient.

## 2017-06-29 NOTE — Care Management Note (Signed)
Case Management Note  Patient Details  Name: KIASIA CHOU MRN: [id number] Date of Birth: [birth year]  Subjective/Objective:    Adm with CHF/UTI. From home with husband, ind with ADL's. Has Cane and RW pta, uses RW primarily. Has PCP, husband drives her to appointments. Recommended for [facility] PT. Offered choice of [facility]. Patient is concerned about any co-pays.                 Action/Plan: Tim of [facility] made aware of referral, will retrieve orders when available. Anticipate DC tomorrow. Patient has weaned to room air.   Expected Discharge Date:    06/30/2017               Expected Discharge Plan:  [place]  In-House Referral:     Discharge planning Services  CM Consult  Post Acute Care Choice:  Home Health Choice offered to:  Patient  DME Arranged:    DME Agency:     HH Arranged:  PT [place]:  [facility] [employer]  Status of Service:  In process, will continue to follow  If discussed at Long Length of Stay Meetings, dates discussed:    Additional Comments:  Lianah Peed, Chauncey [place], RN 06/29/2017, 2:38 PM

## 2017-06-29 NOTE — Progress Notes (Signed)
[place] for Warfarin (home med) Indication: atrial fibrillation  No Known Allergies  Patient Measurements: Height: 5\' 5"  (165.1 cm) Weight: 191 lb 5.8 oz (86.8 kg) IBW/kg (Calculated) : 57 Heparin Dosing Weight:   Vital Signs: Temp: 98.2 F (36.8 C) (01/07 0520) Temp Source: Oral (01/07 0520) BP: 96/66 (01/07 0520) Pulse Rate: 80 (01/07 0520)  Labs: Recent Labs    06/27/17 0600 06/28/17 0611 06/29/17 0536  HGB  --   --  10.5*  HCT  --   --  33.6*  LABPROT 38.5* 34.9* 26.3*  INR 3.97 3.51 2.44  CREATININE 3.38* 3.51* 3.57*   Estimated Creatinine Clearance: 15.3 mL/min (A) (by C-G formula based on SCr of 3.57 mg/dL (H)).  Assessment: INR back to goal range. Bright red blood per rectum Suspect hemorrhoidal bleed with straining. Ordered MiraLAX. H&H stable. Monitor for now.  Goal of Therapy:  INR 2-3   Plan:  Coumadin 1mg  po x 1 Daily PT/INR Monitor for signs and symptoms of bleeding.   Pricilla Larsson 06/29/2017,12:42 PM

## 2017-06-30 DIAGNOSIS — K5900 Constipation, unspecified: Secondary | ICD-10-CM

## 2017-06-30 DIAGNOSIS — B9620 Unspecified Escherichia coli [E. coli] as the cause of diseases classified elsewhere: Secondary | ICD-10-CM

## 2017-06-30 DIAGNOSIS — D631 Anemia in chronic kidney disease: Secondary | ICD-10-CM

## 2017-06-30 DIAGNOSIS — N189 Chronic kidney disease, unspecified: Secondary | ICD-10-CM

## 2017-06-30 LAB — PROTIME-INR
INR: 1.79
Prothrombin Time: 20.7 seconds — ABNORMAL HIGH (ref 11.4–15.2)

## 2017-06-30 LAB — CBC
HEMATOCRIT: 34.1 % — AB (ref 36.0–46.0)
HEMOGLOBIN: 10.8 g/dL — AB (ref 12.0–15.0)
MCH: 31.5 pg (ref 26.0–34.0)
MCHC: 31.7 g/dL (ref 30.0–36.0)
MCV: 99.4 fL (ref 78.0–100.0)
Platelets: 206 10*3/uL (ref 150–400)
RBC: 3.43 MIL/uL — ABNORMAL LOW (ref 3.87–5.11)
RDW: 15.3 % (ref 11.5–15.5)
WBC: 5.5 10*3/uL (ref 4.0–10.5)

## 2017-06-30 LAB — BASIC METABOLIC PANEL
Anion gap: 14 (ref 5–15)
BUN: 94 mg/dL — ABNORMAL HIGH (ref 6–20)
CALCIUM: 9 mg/dL (ref 8.9–10.3)
CO2: 21 mmol/L — AB (ref 22–32)
CREATININE: 3.42 mg/dL — AB (ref 0.44–1.00)
Chloride: 100 mmol/L — ABNORMAL LOW (ref 101–111)
GFR calc Af Amer: 14 mL/min — ABNORMAL LOW (ref >60)
GFR calc non Af Amer: 12 mL/min — ABNORMAL LOW (ref >60)
GLUCOSE: 135 mg/dL — AB (ref 65–99)
Potassium: 4 mmol/L (ref 3.5–5.1)
Sodium: 135 mmol/L (ref 135–145)

## 2017-06-30 MED ORDER — LISINOPRIL 10 MG PO TABS: 10.0000 mg | ORAL_TABLET | Freq: Every day | ORAL | 0 refills | Status: DC

## 2017-06-30 MED ORDER — WARFARIN SODIUM 2.5 MG PO TABS
2.5000 mg | ORAL_TABLET | Freq: Once | ORAL | Status: AC
  Administered 2017-06-30: 2.5 mg via ORAL
  Filled 2017-06-30: qty 1

## 2017-06-30 MED ORDER — GUAIFENESIN-DM 100-10 MG/5ML PO SYRP: 5.0000 mL | ORAL_SOLUTION | ORAL | 0 refills | Status: DC | PRN

## 2017-06-30 MED ORDER — GUAIFENESIN ER 600 MG PO TB12: 600.0000 mg | ORAL_TABLET | Freq: Two times a day (BID) | ORAL | 0 refills | Status: DC

## 2017-06-30 MED ORDER — SENNOSIDES-DOCUSATE SODIUM 8.6-50 MG PO TABS: 2.0000 | ORAL_TABLET | Freq: Every day | ORAL | 0 refills | Status: DC | PRN

## 2017-06-30 MED ORDER — BISACODYL 10 MG RE SUPP
10.0000 mg | Freq: Every day | RECTAL | Status: DC
  Administered 2017-06-30: 10 mg via RECTAL
  Filled 2017-06-30: qty 1

## 2017-06-30 MED ORDER — POLYETHYLENE GLYCOL 3350 17 GM/SCOOP PO POWD: 17.0000 g | Freq: Every day | ORAL | 0 refills | Status: AC

## 2017-06-30 MED ORDER — POLYETHYLENE GLYCOL 3350 17 G PO PACK: 17.0000 g | PACK | Freq: Two times a day (BID) | ORAL | Status: DC

## 2017-06-30 NOTE — Care Management Important Message (Signed)
Important Message  Patient Details  Name: [name] MRN: [id number] Date of Birth: [birth year]   Medicare Important Message Given:  Yes    Sherald Barge, RN 06/30/2017, 1:39 PM

## 2017-06-30 NOTE — Progress Notes (Signed)
[place] for Warfarin (home med) Indication: atrial fibrillation  No Known Allergies  Patient Measurements: Height: 5\' 5"  (165.1 cm) Weight: 188 lb 11.4 oz (85.6 kg) IBW/kg (Calculated) : 57 Heparin Dosing Weight:   Vital Signs: Temp: 98 F (36.7 C) (01/08 0521) Temp Source: Oral (01/08 0521) BP: 90/59 (01/08 0521) Pulse Rate: 60 (01/08 0521)  Labs: Recent Labs    06/28/17 0611 06/29/17 0536 06/30/17 0505  HGB  --  10.5* 10.8*  HCT  --  33.6* 34.1*  PLT  --   --  206  LABPROT 34.9* 26.3* 20.7*  INR 3.51 2.44 1.79  CREATININE 3.51* 3.57* 3.42*   Estimated Creatinine Clearance: 15.8 mL/min (A) (by C-G formula based on SCr of 3.42 mg/dL (H)).  Assessment: INR quickly dropped to subtherapeutic level.  Bright red blood per rectum.  No Vitamin K charted as given.  H/H appears stable.  Pt is on amiodarone.   Suspect hemorrhoidal bleed with straining. Ordered MiraLAX. Monitor for now.  Goal of Therapy:  INR 2-3   Plan:  Coumadin 2.5mg  po x 1 Daily PT/INR, monitor CBC Monitor for signs and symptoms of bleeding.   [place] Crane, Andreu Drudge A 06/30/2017,10:12 AM

## 2017-06-30 NOTE — Progress Notes (Signed)
Pt IV removed, WNL. D/C instructions given to pt. Verbalized understanding. Pt family at bedside to transport home.

## 2017-06-30 NOTE — Discharge Summary (Signed)
Physician Discharge Summary  [name] GGY:[id number] DOB: [birth year] DOA: 06/26/2017  PCP: Lemmie Evens, MD  Admit date: 06/26/2017 Discharge date: 06/30/2017  Admitted From: Home Disposition: Home  Recommendations for Outpatient Follow-up:  1. Follow up with PCP in 1-2 weeks. Please monitor renal function during outpatient follow-up 2. Follow-up at advanced heart failure clinic at [facility] on 07/08/2017 at 2:30 PM  [place]: RN and PT Equipment/Devices: None  Discharge Condition: Fair CODE STATUS: Full code Diet recommendation: Heart Healthy / Carb Modified     Discharge Diagnoses:  Principal Problem:   Acute on chronic combined systolic and diastolic CHF (congestive heart failure) (HCC)   Active Problems:   Escherichia coli UTI (urinary tract infection)   Dyslipidemia   Type 2 diabetes, uncontrolled, with renal manifestation (HCC)   Chronic renal disease, stage 4, severely decreased glomerular filtration rate (GFR) between 15-29 mL/min/1.73 square meter (HCC)   Implantable cardioverter-defibrillator-CRT- Mdt   COPD (chronic obstructive pulmonary disease) (HCC)   Paroxysmal atrial fibrillation ([place])   Diabetes mellitus type 2 with complications (HCC)   OSA (obstructive sleep apnea)   Obesity (BMI 30-39.9)   Brief narrative/history of present illness Please refer to admission H&P for details, in brief,72 y.o.female,with 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 fib on Coumadin, type2diabetes mellitus with chronic kidney disease stage III-IV,anemia of chronic kidney disease, obstructive sleep apnea on CPAP, dyslipidemia and obesity presented to the ED with increasing shortness of breath for past 3-4 days. Patient was diagnosed with UTI by her PCP on 12/31 and being treated on abx. Patient admitted for acute on chr systolic CHF and UTI.   Principal Problem: Acute on chronic combined systolic and diastolic CHF (congestive heart  failure) (HCC) Likely triggered by recent UTI.   having good urine output on IV Lasix 80 mg twice daily (I/O and  - 2-D echo with EF 20-25% with diffuse hypokinesis. Dilated LA, RV and RA. Continue Coreg,and statin.  resume lisinopril and Aldactone upon discharge since renal function stable. -Patient would be discharged on her home medications (Lasix a 40 mg twice a day, lisinopril, Aldactone, Coreg and statin). She has appointment at the advanced heart failure clinic on 1/16.  Active Problems: Nonischemic cardiomyopathy  Had CRT-Dplaced in 2014 which was extracted in 2016 due to enteric focal bacteremia and pacemaker lead vegetation. (Prior to that she had Medtronic CRT-D placed in 2005 which was removed in 2007 due to enterococcal bacteremia) Patient   last saw Dr. Aundra Dubin in October 2017 at the CHF clinic. Outpatient follow-up for next week has been arranged. Continue medications as outlined above.   UTI (urinary tract infection) Urine culture from PCP office going 50-100 K Escherichia coli sensitive to all the antibiotics she has received (Keflex, Levaquin and Rocephin). Complete seven-day course of antibiotics on 1/7.   Bright red blood per rectum Suspect hemorrhoidal bleed with straining. Currently resolved. H&H stable.    acute on Chronic renal disease, stage 4, severely decreased glomerular filtration rate (GFR) between 15-29 mL/min/1.73 square meter (HCC) Worsened renal function with IV Lasix. Held lisinopril and Aldactone.. Function now returning to baseline. Follows with Dr. Joelyn Oms.Recently had AV graft placed by Dr. Trula Slade. Resume all home medications.   Type 2 diabetes, controlled,with renal manifestation (HCC)  Recent A1c of 6.9. Continue Amaryl.    COPD (chronic obstructive pulmonary disease) (HCC) Stable. Continue home inhalers   Paroxysmal atrial fibrillation (HCC) HR stable. continue  beta blocker and amiodarone.  continue Coumadin.  Was previously on digoxin which was discontinued due to progressive renal disease.  Prolonged QTC (534) Magnesium normal. Avoided QT prolonging agents.   OSA (obstructive sleep apnea)  nighttime CPAP   Generalized weakness PT recommends home health with supervision. Will arrange RN and PT.  Productive cough Chest x-ray negative for infiltrate. Added antitussives.  Constipation Prescribe scheduled MiraLAX and when necessary Senokot.  Consults: None Procedure: 2-D echo  Disposition: Home   Discharge Instructions   Allergies as of 06/30/2017   No Known Allergies     Medication List    STOP taking these medications   levofloxacin 250 MG tablet Commonly known as:  LEVAQUIN     TAKE these medications   amiodarone 200 MG tablet Commonly known as:  PACERONE Take 200 mg by mouth daily.   calcitRIOL 0.25 MCG capsule Commonly known as:  ROCALTROL Take 0.25 mcg by mouth daily.   carvedilol 6.25 MG tablet Commonly known as:  COREG TAKE 1 TABLET BY MOUTH TWICE DAILY WITH A MEAL.   colchicine 0.6 MG tablet Take 0.6 mg by mouth 2 (two) times daily as needed (gout). Reported on 11/06/2015   feeding supplement (ENSURE ENLIVE) Liqd Take 237 mLs by mouth 2 (two) times daily between meals. What changed:  when to take this   ferrous sulfate 324 (65 Fe) MG Tbec Take 1 tablet (325 mg total) by mouth 2 (two) times daily.   furosemide 40 MG tablet Commonly known as:  LASIX TAKE 1 TABLET BY MOUTH TWICE DAILY.   glimepiride 2 MG tablet Commonly known as:  AMARYL Take 2 mg by mouth daily with breakfast.   guaiFENesin 600 MG 12 hr tablet Commonly known as:  MUCINEX Take 1 tablet (600 mg total) by mouth 2 (two) times daily.   guaiFENesin-dextromethorphan 100-10 MG/5ML syrup Commonly known as:  ROBITUSSIN DM Take 5 mLs by mouth every 4 (four) hours as needed for cough.   isosorbide mononitrate 30 MG 24 hr tablet Commonly known as:  IMDUR Take 1 tablet (30 mg  total) by mouth daily.   levothyroxine 50 MCG tablet Commonly known as:  SYNTHROID, LEVOTHROID Take 50 mcg by mouth daily before breakfast.   lisinopril 10 MG tablet Commonly known as:  PRINIVIL,ZESTRIL Take 1 tablet (10 mg total) by mouth daily. What changed:    how much to take  how to take this  when to take this   ondansetron 4 MG tablet Commonly known as:  ZOFRAN Take 4 mg by mouth every 4 (four) hours as needed for nausea or vomiting. Reported on 11/06/2015   oxyCODONE-acetaminophen 10-325 MG tablet Commonly known as:  PERCOCET Take 1 tablet by mouth every 6 (six) hours as needed for pain.   polyethylene glycol powder powder Commonly known as:  GLYCOLAX/MIRALAX Take 17 g by mouth daily. What changed:    when to take this  reasons to take this   pravastatin 40 MG tablet Commonly known as:  PRAVACHOL Take 80 mg by mouth at bedtime.   senna-docusate 8.6-50 MG tablet Commonly known as:  Senokot-S Take 2 tablets by mouth daily as needed for mild constipation.   spironolactone 25 MG tablet Commonly known as:  ALDACTONE TAKE 1/2 TABLET BY MOUTH DAILY.   warfarin 2.5 MG tablet Commonly known as:  COUMADIN Take 0.5-1 tablets (1.25-2.5 mg total) by mouth daily at 6 PM. Takes 1 tablet on Mon, Wed, Fri, take 0.5 tablet on all other days What changed:  additional instructions  Follow-up Information    [facility]. Go on 07/08/2017.   Specialty:  Cardiology Why:  2:30 PM, Advanced Heart Failure Clinic, parking code [place] information: [address] [id number] Danice Goltz [place] [address] [phone]       Lemmie Evens, MD. Schedule an appointment as soon as possible for a visit in 1 week(s).   Specialty:  Family Medicine Contact information: [place] [address] [phone]          No Known Allergies    Procedures/Studies: Dg Chest Portable 1  View  Result Date: 06/26/2017 CLINICAL DATA:  Shortness of breath. EXAM: PORTABLE CHEST 1 VIEW COMPARISON:  02/23/2015 FINDINGS: Midline trachea. Cardiomegaly accentuated by AP portable technique. No definite pleural fluid. No pneumothorax. Low lung volumes with resultant pulmonary interstitial prominence. Scarring at the left greater than right lung bases. No lobar consolidation. Advanced degenerative changes of both glenohumeral joints. IMPRESSION: Cardiomegaly and low lung volumes.  No acute findings. Electronically Signed   By: Abigail Miyamoto M.D.   On: 06/26/2017 08:29    2-D echo Study Conclusions  - Left ventricle: The cavity size was normal. Wall thickness was   increased in a pattern of mild LVH. Systolic function was   severely reduced. The estimated ejection fraction was in the   range of 20% to 25%. Diffuse hypokinesis. The study is not   technically sufficient to allow evaluation of LV diastolic   function. - Aortic valve: Moderately calcified annulus. Trileaflet;   moderately thickened leaflets. There was mild regurgitation.   Valve area (VTI): 1.56 cm^2. Valve area (Vmax): 1.66 cm^2. - Mitral valve: Mildly calcified annulus. Mildly thickened leaflets   . There was mild regurgitation. - Left atrium: The atrium was severely dilated. - Right ventricle: The cavity size was mildly to moderately   dilated. - Right atrium: The atrium was moderately dilated.  Subjective: Reports her breathing to be much better except for cough. Diuresing quite well. Had 2 small bowel movements today with suppository.  Discharge Exam: Vitals:   06/29/17 2104 06/30/17 0521  BP: 100/66 (!) 90/59  Pulse: 64 60  Resp: 18 18  Temp: 98.6 F (37 C) 98 F (36.7 C)  SpO2: 97% 96%   Vitals:   06/29/17 0909 06/29/17 1505 06/29/17 2104 06/30/17 0521  BP:  97/69 100/66 (!) 90/59  Pulse:  62 64 60  Resp:  18 18 18   Temp:  98.3 F (36.8 C) 98.6 F (37 C) 98 F (36.7 C)  TempSrc:  Oral Oral Oral   SpO2: 96% 98% 97% 96%  Weight:    85.6 kg (188 lb 11.4 oz)  Height:       Gen.: Elderly female not in distress, appears fatigued HEENT: Moist mucosa, supple neck, no JVD Chest: Clear breath sounds bilaterally, no added sounds CVS: S1 and S2 irregular, no murmurs GI: Soft, nondistended, nontender, bowel sounds present Musculoskeletal : Warm, no edema       The results of significant diagnostics from this hospitalization (including imaging, microbiology, ancillary and laboratory) are listed below for reference.     Microbiology: Recent Results (from the past 240 hour(s))  Urine culture     Status: None   Collection Time: 06/26/17  8:30 AM  Result Value Ref Range Status   Specimen Description URINE, CLEAN CATCH  Final   Special Requests NONE  Final   Culture   Final    NO GROWTH Performed at [facility]  [facility], [place] [address] [place], [place] [address]    Report Status 06/27/2017 FINAL  Final  MRSA PCR Screening     Status: None   Collection Time: 06/26/17  5:46 PM  Result Value Ref Range Status   MRSA by PCR NEGATIVE NEGATIVE Final    Comment:        The GeneXpert MRSA Assay (FDA approved for NASAL specimens only), is one component of a comprehensive MRSA colonization surveillance program. It is not intended to diagnose MRSA infection nor to guide or monitor treatment for MRSA infections.      Labs: BNP (last 3 results) Recent Labs    06/26/17 0806  BNP 2,595.6*   Basic Metabolic Panel: Recent Labs  Lab 06/26/17 0806 06/27/17 0600 06/28/17 0611 06/29/17 0536 06/30/17 0505  NA 140 139 139 136 135  K 4.4 4.6 4.4 4.0 4.0  CL 105 106 105 101 100*  CO2 18* 20* 21* 22 21*  GLUCOSE 212* 129* 119* 156* 135*  BUN 70* 81* 93* 95* 94*  CREATININE 3.01* 3.38* 3.51* 3.57* 3.42*  CALCIUM 9.9 9.4 9.5 9.0 9.0  MG 2.4  --   --   --   --    Liver Function Tests: Recent Labs  Lab 06/26/17 0806  AST 32  ALT 28  ALKPHOS 49  BILITOT 1.3*  PROT 7.1   ALBUMIN 3.8   No results for input(s): LIPASE, AMYLASE in the last 168 hours. No results for input(s): AMMONIA in the last 168 hours. CBC: Recent Labs  Lab 06/26/17 0806 06/29/17 0536 06/30/17 0505  WBC 8.0  --  5.5  NEUTROABS 6.4  --   --   HGB 10.5* 10.5* 10.8*  HCT 34.0* 33.6* 34.1*  MCV 100.3*  --  99.4  PLT 242  --  206   Cardiac Enzymes: Recent Labs  Lab 06/26/17 0806  TROPONINI 0.05*   BNP: Invalid input(s): POCBNP CBG: No results for input(s): GLUCAP in the last 168 hours. D-Dimer No results for input(s): DDIMER in the last 72 hours. Hgb A1c No results for input(s): HGBA1C in the last 72 hours. Lipid Profile No results for input(s): CHOL, HDL, LDLCALC, TRIG, CHOLHDL, LDLDIRECT in the last 72 hours. Thyroid function studies No results for input(s): TSH, T4TOTAL, T3FREE, THYROIDAB in the last 72 hours.  Invalid input(s): FREET3 Anemia work up No results for input(s): VITAMINB12, FOLATE, FERRITIN, TIBC, IRON, RETICCTPCT in the last 72 hours. Urinalysis    Component Value Date/Time   COLORURINE YELLOW 06/26/2017 0830   APPEARANCEUR HAZY (A) 06/26/2017 0830   LABSPEC 1.012 06/26/2017 0830   PHURINE 5.0 06/26/2017 0830   GLUCOSEU NEGATIVE 06/26/2017 0830   HGBUR NEGATIVE 06/26/2017 0830   BILIRUBINUR NEGATIVE 06/26/2017 0830   KETONESUR NEGATIVE 06/26/2017 0830   PROTEINUR 30 (A) 06/26/2017 0830   UROBILINOGEN 0.2 01/08/2015 0940   NITRITE NEGATIVE 06/26/2017 0830   LEUKOCYTESUR NEGATIVE 06/26/2017 0830   Sepsis Labs Invalid input(s): PROCALCITONIN,  WBC,  LACTICIDVEN Microbiology Recent Results (from the past 240 hour(s))  Urine culture     Status: None   Collection Time: 06/26/17  8:30 AM  Result Value Ref Range Status   Specimen Description URINE, CLEAN CATCH  Final   Special Requests NONE  Final   Culture   Final    NO GROWTH Performed at [facility], [place] [address], [place], [place] [address]    Report Status 06/27/2017 FINAL  Final   MRSA PCR Screening  Status: None   Collection Time: 06/26/17  5:46 PM  Result Value Ref Range Status   MRSA by PCR NEGATIVE NEGATIVE Final    Comment:        The GeneXpert MRSA Assay (FDA approved for NASAL specimens only), is one component of a comprehensive MRSA colonization surveillance program. It is not intended to diagnose MRSA infection nor to guide or monitor treatment for MRSA infections.      Time coordinating discharge: Over 30 minutes  SIGNED:   Louellen Molder, MD  [facility] 06/30/2017, 1:39 PM Pager   If 7PM-7AM, please contact night-coverage [email or web address] Password [email or web address]

## 2017-06-30 NOTE — Care Management Note (Signed)
Case Management Note  Patient Details  Name: FALLAN MCCAREY MRN: [id number] Date of Birth: October 13, 1945  Expected Discharge Date:       06/30/2016           Expected Discharge Plan:  [place]  In-House Referral:     Discharge planning Services  CM Consult  Post Acute Care Choice:  Home Health Choice offered to:  Patient  DME Arranged:    DME Agency:     HH Arranged:  PT [place]:  [facility]  Status of Service:  Completed, signed off  If discussed at [employer], dates discussed:    Additional Comments: DC home today with [place] PT. Tim, [facility] rep, aware of DC home today and will pull pt info from chart. Pt aware HH has 48 hrs to make first visit.   Sherald Barge, RN 06/30/2017, 1:40 PM

## 2017-06-30 NOTE — Discharge Instructions (Signed)
Heart Failure Heart failure is a condition in which the heart has trouble pumping blood because it has become weak or stiff. This means that the heart does not pump blood efficiently for the body to work well. For some people with heart failure, fluid may back up into the lungs and there may be swelling (edema) in the lower legs. Heart failure is usually a long-term (chronic) condition. It is important for you to take good care of yourself and follow the treatment plan from your health care provider. What are the causes? This condition is caused by some health problems, including:  High blood pressure (hypertension). Hypertension causes the heart muscle to work harder than normal. High blood pressure eventually causes the heart to become stiff and weak.  Coronary artery disease (CAD). CAD is the buildup of cholesterol and fat (plaques) in the arteries of the heart.  Heart attack (myocardial infarction). Injured tissue, which is caused by the heart attack, does not contract as well and the heart's ability to pump blood is weakened.  Abnormal heart valves. When the heart valves do not open and close properly, the heart muscle must pump harder to keep the blood flowing.  Heart muscle disease (cardiomyopathy or myocarditis). Heart muscle disease is damage to the heart muscle from a variety of causes, such as drug or alcohol abuse, infections, or unknown causes. These can increase the risk of heart failure.  Lung disease. When the lungs do not work properly, the heart must work harder.  What increases the risk? Risk of heart failure increases as a person ages. This condition is also more likely to develop in people who:  Are overweight.  Are female.  Smoke or chew tobacco.  Abuse alcohol or illegal drugs.  Have taken medicines that can damage the heart, such as chemotherapy drugs.  Have diabetes. ? High blood sugar (glucose) is associated with high fat (lipid) levels in the blood. ? Diabetes  can also damage tiny blood vessels that carry nutrients to the heart muscle.  Have abnormal heart rhythms.  Have thyroid problems.  Have low blood counts (anemia).  What are the signs or symptoms? Symptoms of this condition include:  Shortness of breath with activity, such as when climbing stairs.  Persistent cough.  Swelling of the feet, ankles, legs, or abdomen.  Unexplained weight gain.  Difficulty breathing when lying flat (orthopnea).  Waking from sleep because of the need to sit up and get more air.  Rapid heartbeat.  Fatigue and loss of energy.  Feeling light-headed, dizzy, or close to fainting.  Loss of appetite.  Nausea.  Increased urination during the night (nocturia).  Confusion.  How is this diagnosed? This condition is diagnosed based on:  Medical history, symptoms, and a physical exam.  Diagnostic tests, which may include: ? Echocardiogram. ? Electrocardiogram (ECG). ? Chest X-ray. ? Blood tests. ? Exercise stress test. ? Radionuclide scans. ? Cardiac catheterization and angiogram.  How is this treated? Treatment for this condition is aimed at managing the symptoms of heart failure. Medicines, behavioral changes, or other treatments may be necessary to treat heart failure. Medicines These may include:  Angiotensin-converting enzyme (ACE) inhibitors. This type of medicine blocks the effects of a blood protein called angiotensin-converting enzyme. ACE inhibitors relax (dilate) the blood vessels and help to lower blood pressure.  Angiotensin receptor blockers (ARBs). This type of medicine blocks the actions of a blood protein called angiotensin. ARBs dilate the blood vessels and help to lower blood pressure.  Water   pills (diuretics). Diuretics cause the kidneys to remove salt and water from the blood. The extra fluid is removed through urination, leaving a lower volume of blood that the heart has to pump.  Beta blockers. These improve heart  muscle strength and they prevent the heart from beating too quickly.  Digoxin. This increases the force of the heartbeat.  Healthy behavior changes These may include:  Reaching and maintaining a healthy weight.  Stopping smoking or chewing tobacco.  Eating heart-healthy foods.  Limiting or avoiding alcohol.  Stopping use of street drugs (illegal drugs).  Physical activity.  Other treatments These may include:  Surgery to open blocked coronary arteries or repair damaged heart valves.  Placement of a biventricular pacemaker to improve heart muscle function (cardiac resynchronization therapy). This device paces both the right ventricle and left ventricle.  Placement of a device to treat serious abnormal heart rhythms (implantable cardioverter defibrillator, or ICD).  Placement of a device to improve the pumping ability of the heart (left ventricular assist device, or LVAD).  Heart transplant. This can cure heart failure, and it is considered for certain patients who do not improve with other therapies.  Follow these instructions at home: Medicines  Take over-the-counter and prescription medicines only as told by your health care provider. Medicines are important in reducing the workload of your heart, slowing the progression of heart failure, and improving your symptoms. ? Do not stop taking your medicine unless your health care provider told you to do that. ? Do not skip any dose of medicine. ? Refill your prescriptions before you run out of medicine. You need your medicines every day. Eating and drinking   Eat heart-healthy foods. Talk with a dietitian to make an eating plan that is right for you. ? Choose foods that contain no trans fat and are low in saturated fat and cholesterol. Healthy choices include fresh or frozen fruits and vegetables, fish, lean meats, legumes, fat-free or low-fat dairy products, and whole-grain or high-fiber foods. ? Limit salt (sodium) if  directed by your health care provider. Sodium restriction may reduce symptoms of heart failure. Ask a dietitian to recommend heart-healthy seasonings. ? Use healthy cooking methods instead of frying. Healthy methods include roasting, grilling, broiling, baking, poaching, steaming, and stir-frying.  Limit your fluid intake if directed by your health care provider. Fluid restriction may reduce symptoms of heart failure. Lifestyle  Stop smoking or using chewing tobacco. Nicotine and tobacco can damage your heart and your blood vessels. Do not use nicotine gum or patches before talking to your health care provider.  Limit alcohol intake to no more than 1 drink per day for non-pregnant women and 2 drinks per day for men. One drink equals 12 oz of beer, 5 oz of wine, or 1 oz of hard liquor. ? Drinking more than that is harmful to your heart. Tell your health care provider if you drink alcohol several times a week. ? Talk with your health care provider about whether any level of alcohol use is safe for you. ? If your heart has already been damaged by alcohol or you have severe heart failure, drinking alcohol should be stopped completely.  Stop use of illegal drugs.  Lose weight if directed by your health care provider. Weight loss may reduce symptoms of heart failure.  Do moderate physical activity if directed by your health care provider. People who are elderly and people with severe heart failure should consult with a health care provider for physical activity recommendations.   Monitor important information  Weigh yourself every day. Keeping track of your weight daily helps you to notice excess fluid sooner. ? Weigh yourself every morning after you urinate and before you eat breakfast. ? Wear the same amount of clothing each time you weigh yourself. ? Record your daily weight. Provide your health care provider with your weight record.  Monitor and record your blood pressure as told by your health  care provider.  Check your pulse as told by your health care provider. Dealing with extreme temperatures  If the weather is extremely hot: ? Avoid vigorous physical activity. ? Use air conditioning or fans or seek a cooler location. ? Avoid caffeine and alcohol. ? Wear loose-fitting, lightweight, and light-colored clothing.  If the weather is extremely cold: ? Avoid vigorous physical activity. ? Layer your clothes. ? Wear mittens or gloves, a hat, and a scarf when you go outside. ? Avoid alcohol. General instructions  Manage other health conditions such as hypertension, diabetes, thyroid disease, or abnormal heart rhythms as told by your health care provider.  Learn to manage stress. If you need help to do this, ask your health care provider.  Plan rest periods when fatigued.  Get ongoing education and support as needed.  Participate in or seek rehabilitation as needed to maintain or improve independence and quality of life.  Stay up to date with immunizations. Keeping current on pneumococcal and influenza immunizations is especially important to prevent respiratory infections.  Keep all follow-up visits as told by your health care provider. This is important. Contact a health care provider if:  You have a rapid weight gain.  You have increasing shortness of breath that is unusual for you.  You are unable to participate in your usual physical activities.  You tire easily.  You cough more than normal, especially with physical activity.  You have any swelling or more swelling in areas such as your hands, feet, ankles, or abdomen.  You are unable to sleep because it is hard to breathe.  You feel like your heart is beating quickly (palpitations).  You become dizzy or light-headed when you stand up. Get help right away if:  You have difficulty breathing.  You notice or your family notices a change in your awareness, such as having trouble staying awake or having  difficulty with concentration.  You have pain or discomfort in your chest.  You have an episode of fainting (syncope). This information is not intended to replace advice given to you by your health care provider. Make sure you discuss any questions you have with your health care provider. Document Released: 06/09/2005 Document Revised: 02/12/2016 Document Reviewed: 01/02/2016 [employer] Interactive Patient Education  2018 [employer]  

## 2017-07-02 ENCOUNTER — Other Ambulatory Visit: Payer: Self-pay

## 2017-07-02 ENCOUNTER — Emergency Department (HOSPITAL_COMMUNITY): Payer: Medicare HMO

## 2017-07-02 ENCOUNTER — Inpatient Hospital Stay (HOSPITAL_COMMUNITY)
Admission: EM | Admit: 2017-07-02 | Discharge: 2017-07-15 | DRG: 291 | Disposition: A | Discharge status: Skilled Nursing Facility | Payer: Medicare HMO | Attending: Family Medicine | Admitting: Family Medicine

## 2017-07-02 DIAGNOSIS — E1165 Type 2 diabetes mellitus with hyperglycemia: Secondary | ICD-10-CM | POA: Diagnosis not present

## 2017-07-02 DIAGNOSIS — Z6832 Body mass index (BMI) 32.0-32.9, adult: Secondary | ICD-10-CM

## 2017-07-02 DIAGNOSIS — E1122 Type 2 diabetes mellitus with diabetic chronic kidney disease: Secondary | ICD-10-CM | POA: Diagnosis present

## 2017-07-02 DIAGNOSIS — I5023 Acute on chronic systolic (congestive) heart failure: Secondary | ICD-10-CM | POA: Diagnosis present

## 2017-07-02 DIAGNOSIS — N189 Chronic kidney disease, unspecified: Secondary | ICD-10-CM | POA: Diagnosis not present

## 2017-07-02 DIAGNOSIS — R42 Dizziness and giddiness: Secondary | ICD-10-CM | POA: Diagnosis not present

## 2017-07-02 DIAGNOSIS — Z801 Family history of malignant neoplasm of trachea, bronchus and lung: Secondary | ICD-10-CM

## 2017-07-02 DIAGNOSIS — F329 Major depressive disorder, single episode, unspecified: Secondary | ICD-10-CM | POA: Diagnosis present

## 2017-07-02 DIAGNOSIS — K219 Gastro-esophageal reflux disease without esophagitis: Secondary | ICD-10-CM | POA: Diagnosis present

## 2017-07-02 DIAGNOSIS — Z8249 Family history of ischemic heart disease and other diseases of the circulatory system: Secondary | ICD-10-CM

## 2017-07-02 DIAGNOSIS — E1151 Type 2 diabetes mellitus with diabetic peripheral angiopathy without gangrene: Secondary | ICD-10-CM | POA: Diagnosis present

## 2017-07-02 DIAGNOSIS — Z95810 Presence of automatic (implantable) cardiac defibrillator: Secondary | ICD-10-CM | POA: Diagnosis not present

## 2017-07-02 DIAGNOSIS — R208 Other disturbances of skin sensation: Secondary | ICD-10-CM | POA: Diagnosis not present

## 2017-07-02 DIAGNOSIS — R0989 Other specified symptoms and signs involving the circulatory and respiratory systems: Secondary | ICD-10-CM | POA: Diagnosis present

## 2017-07-02 DIAGNOSIS — R32 Unspecified urinary incontinence: Secondary | ICD-10-CM | POA: Diagnosis present

## 2017-07-02 DIAGNOSIS — Z961 Presence of intraocular lens: Secondary | ICD-10-CM | POA: Diagnosis present

## 2017-07-02 DIAGNOSIS — I447 Left bundle-branch block, unspecified: Secondary | ICD-10-CM | POA: Diagnosis present

## 2017-07-02 DIAGNOSIS — E1129 Type 2 diabetes mellitus with other diabetic kidney complication: Secondary | ICD-10-CM | POA: Diagnosis present

## 2017-07-02 DIAGNOSIS — Z79890 Hormone replacement therapy (postmenopausal): Secondary | ICD-10-CM

## 2017-07-02 DIAGNOSIS — R0602 Shortness of breath: Secondary | ICD-10-CM | POA: Diagnosis present

## 2017-07-02 DIAGNOSIS — D72829 Elevated white blood cell count, unspecified: Secondary | ICD-10-CM

## 2017-07-02 DIAGNOSIS — I10 Essential (primary) hypertension: Secondary | ICD-10-CM | POA: Diagnosis present

## 2017-07-02 DIAGNOSIS — I4891 Unspecified atrial fibrillation: Secondary | ICD-10-CM | POA: Diagnosis present

## 2017-07-02 DIAGNOSIS — J449 Chronic obstructive pulmonary disease, unspecified: Secondary | ICD-10-CM | POA: Diagnosis present

## 2017-07-02 DIAGNOSIS — Z515 Encounter for palliative care: Secondary | ICD-10-CM

## 2017-07-02 DIAGNOSIS — J438 Other emphysema: Secondary | ICD-10-CM | POA: Diagnosis not present

## 2017-07-02 DIAGNOSIS — Z7189 Other specified counseling: Secondary | ICD-10-CM

## 2017-07-02 DIAGNOSIS — R739 Hyperglycemia, unspecified: Secondary | ICD-10-CM

## 2017-07-02 DIAGNOSIS — G4733 Obstructive sleep apnea (adult) (pediatric): Secondary | ICD-10-CM | POA: Diagnosis present

## 2017-07-02 DIAGNOSIS — IMO0002 Reserved for concepts with insufficient information to code with codable children: Secondary | ICD-10-CM | POA: Diagnosis present

## 2017-07-02 DIAGNOSIS — I482 Chronic atrial fibrillation: Secondary | ICD-10-CM | POA: Diagnosis present

## 2017-07-02 DIAGNOSIS — J441 Chronic obstructive pulmonary disease with (acute) exacerbation: Secondary | ICD-10-CM | POA: Diagnosis present

## 2017-07-02 DIAGNOSIS — I481 Persistent atrial fibrillation: Secondary | ICD-10-CM | POA: Diagnosis present

## 2017-07-02 DIAGNOSIS — Z87891 Personal history of nicotine dependence: Secondary | ICD-10-CM | POA: Diagnosis not present

## 2017-07-02 DIAGNOSIS — N184 Chronic kidney disease, stage 4 (severe): Secondary | ICD-10-CM | POA: Diagnosis present

## 2017-07-02 DIAGNOSIS — I953 Hypotension of hemodialysis: Secondary | ICD-10-CM | POA: Diagnosis not present

## 2017-07-02 DIAGNOSIS — Z79899 Other long term (current) drug therapy: Secondary | ICD-10-CM

## 2017-07-02 DIAGNOSIS — R531 Weakness: Secondary | ICD-10-CM | POA: Diagnosis not present

## 2017-07-02 DIAGNOSIS — M170 Bilateral primary osteoarthritis of knee: Secondary | ICD-10-CM | POA: Diagnosis present

## 2017-07-02 DIAGNOSIS — I132 Hypertensive heart and chronic kidney disease with heart failure and with stage 5 chronic kidney disease, or end stage renal disease: Secondary | ICD-10-CM | POA: Diagnosis present

## 2017-07-02 DIAGNOSIS — N186 End stage renal disease: Secondary | ICD-10-CM | POA: Diagnosis not present

## 2017-07-02 DIAGNOSIS — Z9841 Cataract extraction status, right eye: Secondary | ICD-10-CM

## 2017-07-02 DIAGNOSIS — I428 Other cardiomyopathies: Secondary | ICD-10-CM | POA: Diagnosis present

## 2017-07-02 DIAGNOSIS — D62 Acute posthemorrhagic anemia: Secondary | ICD-10-CM | POA: Diagnosis present

## 2017-07-02 DIAGNOSIS — Z9842 Cataract extraction status, left eye: Secondary | ICD-10-CM

## 2017-07-02 DIAGNOSIS — Z86711 Personal history of pulmonary embolism: Secondary | ICD-10-CM

## 2017-07-02 DIAGNOSIS — I519 Heart disease, unspecified: Secondary | ICD-10-CM | POA: Diagnosis not present

## 2017-07-02 DIAGNOSIS — E039 Hypothyroidism, unspecified: Secondary | ICD-10-CM | POA: Diagnosis present

## 2017-07-02 DIAGNOSIS — D631 Anemia in chronic kidney disease: Secondary | ICD-10-CM | POA: Diagnosis present

## 2017-07-02 DIAGNOSIS — Z992 Dependence on renal dialysis: Secondary | ICD-10-CM | POA: Diagnosis not present

## 2017-07-02 DIAGNOSIS — Z7901 Long term (current) use of anticoagulants: Secondary | ICD-10-CM | POA: Diagnosis not present

## 2017-07-02 DIAGNOSIS — M160 Bilateral primary osteoarthritis of hip: Secondary | ICD-10-CM | POA: Diagnosis present

## 2017-07-02 DIAGNOSIS — Z6833 Body mass index (BMI) 33.0-33.9, adult: Secondary | ICD-10-CM

## 2017-07-02 DIAGNOSIS — E669 Obesity, unspecified: Secondary | ICD-10-CM | POA: Diagnosis present

## 2017-07-02 DIAGNOSIS — N179 Acute kidney failure, unspecified: Secondary | ICD-10-CM | POA: Diagnosis present

## 2017-07-02 DIAGNOSIS — Z96641 Presence of right artificial hip joint: Secondary | ICD-10-CM | POA: Diagnosis present

## 2017-07-02 DIAGNOSIS — J40 Bronchitis, not specified as acute or chronic: Secondary | ICD-10-CM

## 2017-07-02 DIAGNOSIS — R011 Cardiac murmur, unspecified: Secondary | ICD-10-CM | POA: Diagnosis present

## 2017-07-02 DIAGNOSIS — K30 Functional dyspepsia: Secondary | ICD-10-CM | POA: Diagnosis not present

## 2017-07-02 DIAGNOSIS — Z79891 Long term (current) use of opiate analgesic: Secondary | ICD-10-CM

## 2017-07-02 DIAGNOSIS — I480 Paroxysmal atrial fibrillation: Secondary | ICD-10-CM | POA: Diagnosis present

## 2017-07-02 DIAGNOSIS — I340 Nonrheumatic mitral (valve) insufficiency: Secondary | ICD-10-CM | POA: Diagnosis not present

## 2017-07-02 DIAGNOSIS — Z90710 Acquired absence of both cervix and uterus: Secondary | ICD-10-CM

## 2017-07-02 DIAGNOSIS — Z833 Family history of diabetes mellitus: Secondary | ICD-10-CM

## 2017-07-02 DIAGNOSIS — E785 Hyperlipidemia, unspecified: Secondary | ICD-10-CM | POA: Diagnosis present

## 2017-07-02 HISTORY — DX: Urinary tract infection, site not specified: N39.0

## 2017-07-02 HISTORY — DX: Dyspnea, unspecified: R06.00

## 2017-07-02 HISTORY — DX: Unspecified Escherichia coli (E. coli) as the cause of diseases classified elsewhere: B96.20

## 2017-07-02 LAB — I-STAT CG4 LACTIC ACID, ED: LACTIC ACID, VENOUS: 1.6 mmol/L (ref 0.5–1.9)

## 2017-07-02 LAB — COMPREHENSIVE METABOLIC PANEL
ALT: 160 U/L — ABNORMAL HIGH (ref 14–54)
AST: 36 U/L (ref 15–41)
Albumin: 3.6 g/dL (ref 3.5–5.0)
Alkaline Phosphatase: 52 U/L (ref 38–126)
Anion gap: 19 — ABNORMAL HIGH (ref 5–15)
BILIRUBIN TOTAL: 1.1 mg/dL (ref 0.3–1.2)
BUN: 109 mg/dL — AB (ref 6–20)
CO2: 17 mmol/L — ABNORMAL LOW (ref 22–32)
CREATININE: 4.11 mg/dL — AB (ref 0.44–1.00)
Calcium: 9.5 mg/dL (ref 8.9–10.3)
Chloride: 99 mmol/L — ABNORMAL LOW (ref 101–111)
GFR calc Af Amer: 11 mL/min — ABNORMAL LOW (ref >60)
GFR, EST NON AFRICAN AMERICAN: 10 mL/min — AB (ref >60)
Glucose, Bld: 189 mg/dL — ABNORMAL HIGH (ref 65–99)
POTASSIUM: 4.4 mmol/L (ref 3.5–5.1)
Sodium: 135 mmol/L (ref 135–145)
TOTAL PROTEIN: 6.8 g/dL (ref 6.5–8.1)

## 2017-07-02 LAB — GLUCOSE, CAPILLARY: Glucose-Capillary: 248 mg/dL — ABNORMAL HIGH (ref 65–99)

## 2017-07-02 LAB — CBC WITH DIFFERENTIAL/PLATELET
BASOS ABS: 0 10*3/uL (ref 0.0–0.1)
Basophils Relative: 0 %
Eosinophils Absolute: 0 10*3/uL (ref 0.0–0.7)
Eosinophils Relative: 0 %
HEMATOCRIT: 35.5 % — AB (ref 36.0–46.0)
Hemoglobin: 11.1 g/dL — ABNORMAL LOW (ref 12.0–15.0)
LYMPHS ABS: 1.2 10*3/uL (ref 0.7–4.0)
LYMPHS PCT: 16 %
MCH: 30.8 pg (ref 26.0–34.0)
MCHC: 31.3 g/dL (ref 30.0–36.0)
MCV: 98.6 fL (ref 78.0–100.0)
MONO ABS: 0.4 10*3/uL (ref 0.1–1.0)
MONOS PCT: 5 %
NEUTROS ABS: 5.8 10*3/uL (ref 1.7–7.7)
Neutrophils Relative %: 79 %
Platelets: 242 10*3/uL (ref 150–400)
RBC: 3.6 MIL/uL — ABNORMAL LOW (ref 3.87–5.11)
RDW: 15.9 % — AB (ref 11.5–15.5)
WBC: 7.4 10*3/uL (ref 4.0–10.5)

## 2017-07-02 LAB — PROTIME-INR
INR: 3
Prothrombin Time: 30.9 seconds — ABNORMAL HIGH (ref 11.4–15.2)

## 2017-07-02 LAB — I-STAT TROPONIN, ED: TROPONIN I, POC: 0.05 ng/mL (ref 0.00–0.08)

## 2017-07-02 LAB — URINALYSIS, ROUTINE W REFLEX MICROSCOPIC
Bilirubin Urine: NEGATIVE
Glucose, UA: NEGATIVE mg/dL
KETONES UR: NEGATIVE mg/dL
Leukocytes, UA: NEGATIVE
Nitrite: NEGATIVE
PROTEIN: NEGATIVE mg/dL
Specific Gravity, Urine: 1.013 (ref 1.005–1.030)
pH: 5 (ref 5.0–8.0)

## 2017-07-02 LAB — BRAIN NATRIURETIC PEPTIDE: B Natriuretic Peptide: 1605.1 pg/mL — ABNORMAL HIGH (ref 0.0–100.0)

## 2017-07-02 LAB — CBG MONITORING, ED: GLUCOSE-CAPILLARY: 168 mg/dL — AB (ref 65–99)

## 2017-07-02 MED ORDER — FERROUS SULFATE 325 (65 FE) MG PO TABS
325.0000 mg | ORAL_TABLET | Freq: Two times a day (BID) | ORAL | Status: DC
  Administered 2017-07-03 – 2017-07-10 (×16): 325 mg via ORAL
  Filled 2017-07-02 (×17): qty 1

## 2017-07-02 MED ORDER — WARFARIN 1.25 MG HALF TABLET: 1.2500 mg | ORAL_TABLET | Freq: Every day | ORAL | Status: DC

## 2017-07-02 MED ORDER — OXYCODONE HCL 5 MG PO TABS
5.0000 mg | ORAL_TABLET | Freq: Four times a day (QID) | ORAL | Status: DC | PRN
  Administered 2017-07-03 – 2017-07-14 (×12): 5 mg via ORAL
  Filled 2017-07-02 (×13): qty 1

## 2017-07-02 MED ORDER — GUAIFENESIN ER 600 MG PO TB12
600.0000 mg | ORAL_TABLET | Freq: Two times a day (BID) | ORAL | Status: DC
  Administered 2017-07-03 – 2017-07-05 (×6): 600 mg via ORAL
  Filled 2017-07-02 (×6): qty 1

## 2017-07-02 MED ORDER — SODIUM CHLORIDE 0.9% FLUSH: 3.0000 mL | INTRAVENOUS | Status: DC | PRN

## 2017-07-02 MED ORDER — ASPIRIN EC 81 MG PO TBEC
81.0000 mg | DELAYED_RELEASE_TABLET | Freq: Every day | ORAL | Status: DC
  Administered 2017-07-03 – 2017-07-05 (×3): 81 mg via ORAL
  Filled 2017-07-02 (×3): qty 1

## 2017-07-02 MED ORDER — CALCITRIOL 0.25 MCG PO CAPS
0.2500 ug | ORAL_CAPSULE | Freq: Every day | ORAL | Status: DC
  Administered 2017-07-03 – 2017-07-10 (×8): 0.25 ug via ORAL
  Filled 2017-07-02 (×9): qty 1

## 2017-07-02 MED ORDER — ALBUTEROL SULFATE (2.5 MG/3ML) 0.083% IN NEBU: 2.5000 mg | INHALATION_SOLUTION | RESPIRATORY_TRACT | Status: DC | PRN

## 2017-07-02 MED ORDER — AMIODARONE HCL 100 MG PO TABS
100.0000 mg | ORAL_TABLET | Freq: Every day | ORAL | Status: DC
  Administered 2017-07-03 – 2017-07-05 (×3): 100 mg via ORAL
  Filled 2017-07-02 (×3): qty 1

## 2017-07-02 MED ORDER — POLYETHYLENE GLYCOL 3350 17 G PO PACK
17.0000 g | PACK | Freq: Every day | ORAL | Status: DC
  Administered 2017-07-04 – 2017-07-12 (×4): 17 g via ORAL
  Filled 2017-07-02 (×12): qty 1

## 2017-07-02 MED ORDER — IPRATROPIUM BROMIDE 0.02 % IN SOLN
0.5000 mg | Freq: Once | RESPIRATORY_TRACT | Status: AC
  Administered 2017-07-02: 0.5 mg via RESPIRATORY_TRACT
  Filled 2017-07-02: qty 2.5

## 2017-07-02 MED ORDER — ALBUTEROL SULFATE (2.5 MG/3ML) 0.083% IN NEBU
5.0000 mg | INHALATION_SOLUTION | Freq: Once | RESPIRATORY_TRACT | Status: AC
  Administered 2017-07-02: 5 mg via RESPIRATORY_TRACT
  Filled 2017-07-02: qty 6

## 2017-07-02 MED ORDER — SODIUM CHLORIDE 0.9 % IV SOLN
250.0000 mL | INTRAVENOUS | Status: DC | PRN
  Administered 2017-07-10: 11:00:00 via INTRAVENOUS

## 2017-07-02 MED ORDER — METHYLPREDNISOLONE SODIUM SUCC 125 MG IJ SOLR
125.0000 mg | Freq: Once | INTRAMUSCULAR | Status: AC
  Administered 2017-07-02: 125 mg via INTRAVENOUS
  Filled 2017-07-02: qty 2

## 2017-07-02 MED ORDER — SODIUM CHLORIDE 0.9% FLUSH
3.0000 mL | Freq: Two times a day (BID) | INTRAVENOUS | Status: DC
  Administered 2017-07-03 – 2017-07-15 (×15): 3 mL via INTRAVENOUS

## 2017-07-02 MED ORDER — FUROSEMIDE 40 MG PO TABS
40.0000 mg | ORAL_TABLET | Freq: Two times a day (BID) | ORAL | Status: DC
  Administered 2017-07-03: 40 mg via ORAL
  Filled 2017-07-02: qty 1

## 2017-07-02 MED ORDER — LEVOTHYROXINE SODIUM 50 MCG PO TABS
50.0000 ug | ORAL_TABLET | Freq: Every day | ORAL | Status: DC
  Administered 2017-07-03 – 2017-07-15 (×13): 50 ug via ORAL
  Filled 2017-07-02 (×13): qty 1

## 2017-07-02 MED ORDER — ONDANSETRON HCL 4 MG/2ML IJ SOLN: 4.0000 mg | Freq: Four times a day (QID) | INTRAMUSCULAR | Status: DC | PRN

## 2017-07-02 MED ORDER — ACETAMINOPHEN 650 MG RE SUPP: 650.0000 mg | Freq: Four times a day (QID) | RECTAL | Status: DC | PRN

## 2017-07-02 MED ORDER — PRAVASTATIN SODIUM 40 MG PO TABS
80.0000 mg | ORAL_TABLET | Freq: Every day | ORAL | Status: DC
  Administered 2017-07-03 – 2017-07-14 (×13): 80 mg via ORAL
  Filled 2017-07-02 (×13): qty 2

## 2017-07-02 MED ORDER — OXYCODONE-ACETAMINOPHEN 5-325 MG PO TABS
1.0000 | ORAL_TABLET | Freq: Four times a day (QID) | ORAL | Status: DC | PRN
  Administered 2017-07-03 – 2017-07-14 (×10): 1 via ORAL
  Filled 2017-07-02 (×11): qty 1

## 2017-07-02 MED ORDER — INSULIN ASPART 100 UNIT/ML ~~LOC~~ SOLN
4.0000 [IU] | Freq: Three times a day (TID) | SUBCUTANEOUS | Status: DC
  Administered 2017-07-03 (×3): 4 [IU] via SUBCUTANEOUS

## 2017-07-02 MED ORDER — ONDANSETRON HCL 4 MG PO TABS
4.0000 mg | ORAL_TABLET | Freq: Four times a day (QID) | ORAL | Status: DC | PRN
  Administered 2017-07-08: 4 mg via ORAL
  Filled 2017-07-02 (×2): qty 1

## 2017-07-02 MED ORDER — INSULIN ASPART 100 UNIT/ML ~~LOC~~ SOLN
0.0000 [IU] | Freq: Three times a day (TID) | SUBCUTANEOUS | Status: DC
  Administered 2017-07-03: 15 [IU] via SUBCUTANEOUS
  Administered 2017-07-03: 7 [IU] via SUBCUTANEOUS
  Administered 2017-07-03: 15 [IU] via SUBCUTANEOUS

## 2017-07-02 MED ORDER — OXYCODONE-ACETAMINOPHEN 10-325 MG PO TABS: 1.0000 | ORAL_TABLET | Freq: Four times a day (QID) | ORAL | Status: DC | PRN

## 2017-07-02 MED ORDER — IPRATROPIUM-ALBUTEROL 0.5-2.5 (3) MG/3ML IN SOLN
3.0000 mL | Freq: Four times a day (QID) | RESPIRATORY_TRACT | Status: DC
  Administered 2017-07-03 (×2): 3 mL via RESPIRATORY_TRACT
  Filled 2017-07-02 (×2): qty 3

## 2017-07-02 MED ORDER — SENNOSIDES-DOCUSATE SODIUM 8.6-50 MG PO TABS
2.0000 | ORAL_TABLET | Freq: Every day | ORAL | Status: DC | PRN
  Administered 2017-07-07 – 2017-07-08 (×2): 2 via ORAL
  Filled 2017-07-02 (×3): qty 2

## 2017-07-02 MED ORDER — CARVEDILOL 6.25 MG PO TABS
6.2500 mg | ORAL_TABLET | Freq: Two times a day (BID) | ORAL | Status: DC
  Administered 2017-07-03 (×2): 6.25 mg via ORAL
  Filled 2017-07-02 (×2): qty 1

## 2017-07-02 MED ORDER — LISINOPRIL 5 MG PO TABS
5.0000 mg | ORAL_TABLET | Freq: Every day | ORAL | Status: DC
  Administered 2017-07-03: 5 mg via ORAL
  Filled 2017-07-02: qty 1

## 2017-07-02 MED ORDER — INSULIN ASPART 100 UNIT/ML ~~LOC~~ SOLN
0.0000 [IU] | Freq: Every day | SUBCUTANEOUS | Status: DC
  Administered 2017-07-03: 2 [IU] via SUBCUTANEOUS
  Administered 2017-07-03 – 2017-07-04 (×2): 3 [IU] via SUBCUTANEOUS
  Administered 2017-07-05 – 2017-07-12 (×3): 2 [IU] via SUBCUTANEOUS

## 2017-07-02 MED ORDER — ACETAMINOPHEN 325 MG PO TABS
650.0000 mg | ORAL_TABLET | Freq: Four times a day (QID) | ORAL | Status: DC | PRN
  Administered 2017-07-10 – 2017-07-13 (×2): 650 mg via ORAL
  Filled 2017-07-02 (×2): qty 2

## 2017-07-02 MED ORDER — POTASSIUM CHLORIDE CRYS ER 10 MEQ PO TBCR
10.0000 meq | EXTENDED_RELEASE_TABLET | Freq: Two times a day (BID) | ORAL | Status: DC
  Administered 2017-07-03: 10 meq via ORAL
  Filled 2017-07-02: qty 1

## 2017-07-02 MED ORDER — METHYLPREDNISOLONE SODIUM SUCC 40 MG IJ SOLR
40.0000 mg | Freq: Four times a day (QID) | INTRAMUSCULAR | Status: DC
Start: 2017-07-02 — End: 2017-07-05
  Administered 2017-07-03 – 2017-07-05 (×10): 40 mg via INTRAVENOUS
  Filled 2017-07-02 (×10): qty 1

## 2017-07-02 MED ORDER — ISOSORBIDE MONONITRATE ER 30 MG PO TB24
30.0000 mg | ORAL_TABLET | Freq: Every day | ORAL | Status: DC
  Administered 2017-07-03 – 2017-07-15 (×11): 30 mg via ORAL
  Filled 2017-07-02 (×13): qty 1

## 2017-07-02 MED ORDER — ENSURE ENLIVE PO LIQD
237.0000 mL | Freq: Every day | ORAL | Status: DC
  Administered 2017-07-03: 237 mL via ORAL

## 2017-07-02 MED ORDER — GLIMEPIRIDE 4 MG PO TABS
2.0000 mg | ORAL_TABLET | Freq: Every day | ORAL | Status: DC
  Administered 2017-07-03: 2 mg via ORAL
  Filled 2017-07-02: qty 1

## 2017-07-02 MED ORDER — SODIUM CHLORIDE 0.9% FLUSH
3.0000 mL | Freq: Two times a day (BID) | INTRAVENOUS | Status: DC
  Administered 2017-07-02 – 2017-07-03 (×2): 3 mL via INTRAVENOUS

## 2017-07-02 MED ORDER — MAGNESIUM SULFATE 2 GM/50ML IV SOLN
2.0000 g | Freq: Once | INTRAVENOUS | Status: AC
  Administered 2017-07-03: 2 g via INTRAVENOUS
  Filled 2017-07-02: qty 50

## 2017-07-02 NOTE — ED Notes (Signed)
CBG: 168. RN notified.

## 2017-07-02 NOTE — ED Notes (Addendum)
Pt used call light to state that she needed to be changed (approx 21:20). She stated that she "hadn't been changed all day". Her disposable brief was saturated but she added that she had been on the Purewick earlier. No note was found stating that she was. Pt also stated that she didn't call out sooner because she "didn't want to bother people". The disposable brief the Pt had on prior to being changed was not ours. Pt was washed and changed. Given new disposable brief and put on Purewick at 21:30.

## 2017-07-02 NOTE — ED Provider Notes (Signed)
[facility] EMERGENCY DEPARTMENT Provider Note   CSN: [id number] Arrival date & time: 07/02/17  1102     History   Chief Complaint Chief Complaint  Patient presents with  . Weakness    HPI [name] is a 72 y.o. female.  Patient is a 72 year old female with a history of nonischemic cardiomyopathy with an EF between 20 and 25%, chronic kidney disease, paroxysmal atrial fibrillation on Coumadin, COPD, diabetes who was recently hospitalized for CHF exacerbation and UTI and discharged 3 days ago presenting today with worsening shortness of breath, cough, fever and chills.  Family also states she is not been eating and has been terribly weak.  She is having difficulty even getting up and moving around.  She is still taking her oral Lasix 40 mg twice daily.  She finished all antibiotics and is currently not taking an antibiotic.  She denies any diarrhea or vomiting.  She will have diffuse mild abdominal pain but nothing focal.  No known sick contacts.  She has not been checking her weight but feels like she has lost weight.  Denies any swelling in her legs that she is noticed.   The history is provided by the patient and the spouse.    Past Medical History:  Diagnosis Date  . Anemia    a. mild/chronic  . Anemia due to GI blood loss 02/26/2016  . Anemia in chronic renal disease 11/30/2014  . Cardiomyopathy, nonischemic ([place])    a. 1999 nl cath;  b. 12/05 Guidant [place];  c. 10/2005 ICD extraction 2/2 enterococcus bacteremia and Veg on RV lead;  c. 05/2008 low risk Myoview (scarring w/ some evidence of inf ischemia);  d. 11/2012 Echo: EF 15-20%;  e. 01/2013 s/p MDT [place] CRT D, ser # [id number];  f. 05/2013 Echo: EF 15%.  . Chronic systolic CHF (congestive heart failure) ([place])    a. 11/2012 Echo: EF 15-20%;  b. 05/2013 TEE EF 15%.  . CKD (chronic kidney disease), stage III ([place])    creatinin-1.44 in 1/09; 1.51 in 1/10  . Degenerative joint disease    of knees, shoulder,  and hips  . Enterococcal infection    a. 10/2005 - AICD-explanted  . GERD (gastroesophageal reflux disease)   . Hilar density    a. infrahilar mass/adenopathy on CT scan 5/07; subsequently  resolved  . History of blood transfusion   . Hyperlipidemia   . Hypertension   . Hypothyroidism   . ICD (implantable cardioverter-defibrillator) infection ([place])    removed 2016  . Implantable cardioverter-defibrillator-CRT- Mdt    a.  01/2013 s/p MDT [place] CRT D, ser # [id number] - ICD has been removed  . LBBB (left bundle branch block)   . Obstructive sleep apnea    uses cpap  . PAF (paroxysmal atrial fibrillation) ([place])    a. 07/2012 s/p TEE/DCCV;  b. chronic coumadin;  c. 05/2013 Recurrent Afib->TEE/DCCV and amio initiation.  . Peripheral vascular disease ([place])   . Pneumonia 07/2005  . Pulmonary embolism ([place])    a. 07/2005 after total right hip arthroplasty  . Tobacco abuse    a. discontinued in 1997, and then resumed  . Type II diabetes mellitus ([place])    type 2  . Urinary incontinence   . Villous adenoma of colon    a. tubovillous adenomatous polyp with focal high grade dysplasia; presented with hematochezia - followed by Dr. Laural Golden.    Patient Active Problem List   Diagnosis Date Noted  .  Acute on chronic systolic (congestive) heart failure ([place]) 06/26/2017  . UTI (urinary tract infection) 06/26/2017  . Obesity (BMI 30-39.9) 09/15/2016  . OSA (obstructive sleep apnea) 09/14/2016  . Anemia due to GI blood loss 02/26/2016  . Acute respiratory failure with hypoxia ([place]) 01/08/2015  . Paroxysmal atrial fibrillation ([place]) 01/08/2015  . Diabetes mellitus type 2 with complications ([place]) 16/03/9603  . Healthcare-associated pneumonia 01/07/2015  . HCAP (healthcare-associated pneumonia) 01/07/2015  . ICD (implantable cardioverter-defibrillator) infection ([place])   . Enterococcal bacteremia   . Diabetic feet ([place])   . Bacteremia 12/07/2014  . SOB (shortness of breath)   . Acute on chronic  combined systolic and diastolic CHF (congestive heart failure) ([place]) 12/01/2014  . COPD (chronic obstructive pulmonary disease) ([place]) 11/30/2014  . Anemia in chronic renal disease 11/30/2014  . Hypothyroidism 11/30/2014  . Fever 11/30/2014  . Symptomatic anemia 10/17/2014  . Implantable cardioverter-defibrillator-CRT- Mdt   . Atrial fibrillation ([place]) 06/19/2013  . Chronic anticoagulation 08/19/2012  . Chronic combined systolic and diastolic CHF (congestive heart failure) ([place]) 08/03/2012  . History of pulmonary embolism   . Type 2 diabetes, uncontrolled, with renal manifestation ([place])   . Tobacco abuse, in remission   . Chronic renal disease, stage 4, severely decreased glomerular filtration rate (GFR) between 15-29 mL/min/1.73 square meter (HCC)   . LBBB 01/29/2009  . Dyslipidemia 06/04/2007  . HTN (hypertension) 06/04/2007    Past Surgical History:  Procedure Laterality Date  . A-V CARDIAC PACEMAKER INSERTION  12/05   Biventricular pacemaker/AICD  . ABDOMINAL HYSTERECTOMY  1990/92   Initial partial hysterectomy followed by BSO  . BASCILIC VEIN TRANSPOSITION Right 03/05/2017   Procedure: RIGHT 1ST STAGE BASCILIC VEIN TRANSPOSITION;  Surgeon: Serafina Mitchell, MD;  Location: [facility];  Service: Vascular;  Laterality: Right;  . BASCILIC VEIN TRANSPOSITION Right 05/28/2017   Procedure: BASILIC VEIN TRANSPOSITION SECOND STAGE RIGHT;  Surgeon: Serafina Mitchell, MD;  Location: [facility];  Service: Vascular;  Laterality: Right;  . BI-VENTRICULAR IMPLANTABLE CARDIOVERTER DEFIBRILLATOR N/A 01/24/2013   Procedure: BI-VENTRICULAR IMPLANTABLE CARDIOVERTER DEFIBRILLATOR  (CRT-D);  Surgeon: Evans Lance, MD;  Location: [facility] CATH LAB;  Service: Cardiovascular;  Laterality: N/A;  . BI-VENTRICULAR IMPLANTABLE CARDIOVERTER DEFIBRILLATOR  (CRT-D)  01/24/2013  . CARDIAC CATHETERIZATION    . CARDIOVERSION N/A 08/20/2012   Procedure: TEE GUIDED CARDIOVERSION;  Surgeon: Yehuda Savannah, MD;  Location: [facility] ORS;   Service: Cardiovascular;  Laterality: N/A;  To be done @ bedside  . CARDIOVERSION N/A 06/20/2013   Procedure: CARDIOVERSION;  Surgeon: Dorothy Spark, MD;  Location: [facility];  Service: Cardiovascular;  Laterality: N/A;  . CATARACT EXTRACTION W/PHACO Left 08/07/2015   Procedure: CATARACT EXTRACTION PHACO AND INTRAOCULAR LENS PLACEMENT ([place]);  Surgeon: Rutherford Guys, MD;  Location: [facility];  Service: Ophthalmology;  Laterality: Left;  CDE: 7.88  . CATARACT EXTRACTION W/PHACO Right 08/21/2015   Procedure: CATARACT EXTRACTION PHACO AND INTRAOCULAR LENS PLACEMENT (IOC);  Surgeon: Rutherford Guys, MD;  Location: [facility];  Service: Ophthalmology;  Laterality: Right;  CDE:8.55  . COLONOSCOPY Left 10/24/2014   Procedure: COLONOSCOPY;  Surgeon: Carol Ada, MD;  Location[facility]OSCOPY;  Service: Endoscopy;  Laterality: Left;  . COLONOSCOPY W/ POLYPECTOMY  2009  . COLONOSCOPY WITH ESOPHAGOGASTRODUODENOSCOPY (EGD) N/A 06/10/2013   Procedure: COLONOSCOPY WITH ESOPHAGOGASTRODUODENOSCOPY (EGD);  Surgeon: Rogene Houston, MD;  [facility];  Service: Endoscopy;  Laterality: N/A;  925  . ESOPHAGOGASTRODUODENOSCOPY N/A 10/21/2014   Procedure: ESOPHAGOGASTRODUODENOSCOPY (EGD);  Surgeon: Inda Castle, MD;  Location: [facility]OS[facility];  Service: Endoscopy;  Laterality: N/A;  . GIVENS CAPSULE STUDY N/A 10/24/2014   Procedure: GIVENS CAPSULE STUDY;  Surgeon: Carol Ada, MD;  Location: [facility];  Service: Endoscopy;  Laterality: N/A;  . ICD LEAD REMOVAL N/A 12/13/2014   Procedure: ICD LEAD REMOVAL/EXTRACTION ;  Surgeon: Evans Lance, MD;  Location: [facility];  Service: Cardiovascular;  Laterality: N/A;  [place] back up  . KNEE ARTHROSCOPY Right 1980's?  Marland Kitchen PACEMAKER REMOVAL  11/18/05   Enterococcal infection  . RIGHT HEART CATHETERIZATION N/A 04/18/2014   Procedure: RIGHT HEART CATH;  Surgeon: Larey Dresser, MD;  Location: [facility] CATH LAB;  Service: Cardiovascular;  Laterality: N/A;  . TEE WITHOUT CARDIOVERSION  N/A 08/20/2012   Procedure: TRANSESOPHAGEAL ECHOCARDIOGRAM (TEE);  Surgeon: Yehuda Savannah, MD;  Location: [facility];  Service: Cardiovascular;  Laterality: N/A;  . TEE WITHOUT CARDIOVERSION N/A 06/20/2013   Procedure: TRANSESOPHAGEAL ECHOCARDIOGRAM (TEE);  Surgeon: Dorothy Spark, MD;  Location: [facility];  Service: Cardiovascular;  Laterality: N/A;  . TEE WITHOUT CARDIOVERSION N/A 12/08/2014   Procedure: TRANSESOPHAGEAL ECHOCARDIOGRAM (TEE);  Surgeon: Fay Records, MD;  [facility];  Service: Cardiovascular;  Laterality: N/A;  . TOTAL HIP ARTHROPLASTY Right 07/2005  . TUBAL LIGATION  1980's    OB History    No data available       Home Medications    Prior to Admission medications   Medication Sig Start Date End Date Taking? Authorizing Provider  amiodarone (PACERONE) 200 MG tablet Take 200 mg by mouth daily.  02/25/17   [provider]  calcitRIOL (ROCALTROL) 0.25 MCG capsule Take 0.25 mcg by mouth daily. 06/09/16   [provider]  carvedilol (COREG) 6.25 MG tablet TAKE 1 TABLET BY MOUTH TWICE DAILY WITH A MEAL. 06/13/16   Larey Dresser, MD  colchicine 0.6 MG tablet Take 0.6 mg by mouth 2 (two) times daily as needed (gout). Reported on 11/06/2015 12/20/13   [provider]  feeding supplement, ENSURE ENLIVE, (ENSURE ENLIVE) LIQD Take 237 mLs by mouth 2 (two) times daily between meals. Patient taking differently: Take 237 mLs by mouth daily with lunch.  10/26/14   Riccardo Dubin, MD  ferrous sulfate 324 (65 FE) MG TBEC Take 1 tablet (325 mg total) by mouth 2 (two) times daily. 12/02/14   Orson Eva, MD  furosemide (LASIX) 40 MG tablet TAKE 1 TABLET BY MOUTH TWICE DAILY. 05/27/16   Larey Dresser, MD  glimepiride (AMARYL) 2 MG tablet Take 2 mg by mouth daily with breakfast.  12/05/14   [provider]  guaiFENesin (MUCINEX) 600 MG 12 hr tablet Take 1 tablet (600 mg total) by mouth 2 (two) times daily. 06/30/17   Dhungel, Nishant, MD    guaiFENesin-dextromethorphan (ROBITUSSIN DM) 100-10 MG/5ML syrup Take 5 mLs by mouth every 4 (four) hours as needed for cough. 06/30/17   Dhungel, Flonnie Overman, MD  isosorbide mononitrate (IMDUR) 30 MG 24 hr tablet Take 1 tablet (30 mg total) by mouth daily. 10/26/14   Riccardo Dubin, MD  levothyroxine (SYNTHROID, LEVOTHROID) 50 MCG tablet Take 50 mcg by mouth daily before breakfast. 02/25/17   [provider]  lisinopril (PRINIVIL,ZESTRIL) 10 MG tablet Take 1 tablet (10 mg total) by mouth daily. 06/30/17   Dhungel, Nishant, MD  ondansetron (ZOFRAN) 4 MG tablet Take 4 mg by mouth every 4 (four) hours as needed for nausea or vomiting. Reported on 11/06/2015    [provider]  oxyCODONE-acetaminophen (  PERCOCET) 10-325 MG per tablet Take 1 tablet by mouth every 6 (six) hours as needed for pain.  12/09/13   [provider]  polyethylene glycol powder (GLYCOLAX/MIRALAX) powder Take 17 g by mouth daily. 06/30/17   Dhungel, Flonnie Overman, MD  pravastatin (PRAVACHOL) 40 MG tablet Take 80 mg by mouth at bedtime.     [provider]  senna-docusate (SENOKOT-S) 8.6-50 MG tablet Take 2 tablets by mouth daily as needed for mild constipation. 06/30/17 06/30/18  Dhungel, Flonnie Overman, MD  spironolactone (ALDACTONE) 25 MG tablet TAKE 1/2 TABLET BY MOUTH DAILY. 03/18/16   Bensimhon, Shaune Pascal, MD  warfarin (COUMADIN) 2.5 MG tablet Take 0.5-1 tablets (1.25-2.5 mg total) by mouth daily at 6 PM. Takes 1 tablet on Mon, Wed, Fri, take 0.5 tablet on all other days Patient taking differently: Take 1.25-2.5 mg by mouth daily at 6 PM. Take 0.5 tablet (1.25 mg) by mouth on Monday, Wednesday, & Friday then Take 1 tablet (2.5 mg) on all other days Sunday, Tuesday, Thursday, & Saturday 01/10/15   Samuella Cota, MD    Family History Family History  Problem Relation Age of Onset  . Hypertension Mother   . Diabetes Mother   . Coronary artery disease Father   . Diabetes Brother   . Hypertension Brother   . Lung  cancer Brother   . Arthritis Other   . Diabetes Other   . Heart disease Other        female < 62    Social History Social History   Tobacco Use  . Smoking status: Former Smoker    Years: 12.00    Types: Cigarettes  . Smokeless tobacco: Never Used  . Tobacco comment: 05/2013: Smokes an occasional cigarette.  Says that she doesn't inhale.  Substance Use Topics  . Alcohol use: No    Alcohol/week: 0.0 oz  . Drug use: No     Allergies   Patient has no known allergies.   Review of Systems Review of Systems  All other systems reviewed and are negative.    Physical Exam Updated Vital Signs BP 128/85   Pulse (!) 105   Temp 98.5 F (36.9 C) (Rectal)   Resp 20   Ht 5\' 5"  (1.651 m)   Wt 85.3 kg (188 lb)   SpO2 97%   BMI 31.28 kg/m   Physical Exam  Constitutional: She is oriented to person, place, and time. She appears well-developed and well-nourished. No distress.  HENT:  Head: Normocephalic and atraumatic.  Mouth/Throat: Oropharynx is clear and moist. Mucous membranes are dry.  Eyes: Conjunctivae and EOM are normal. Pupils are equal, round, and reactive to light.  Neck: Normal range of motion. Neck supple.  Cardiovascular: Intact distal pulses. An irregularly irregular rhythm present. Tachycardia present.  No murmur heard. Pulmonary/Chest: Effort normal. No respiratory distress. She has wheezes in the right lower field and the left lower field. She has no rales.  Abdominal: Soft. She exhibits no distension. There is no tenderness. There is no rebound and no guarding.  Musculoskeletal: Normal range of motion. She exhibits no edema or tenderness.  Neurological: She is alert and oriented to person, place, and time.  Skin: Skin is warm and dry. Capillary refill takes 2 to 3 seconds. No rash noted. No erythema.  Psychiatric: She has a normal mood and affect. Her behavior is normal.  Nursing note and vitals reviewed.    ED Treatments / Results  Labs (all labs ordered  are listed, but only abnormal  results are displayed) Labs Reviewed  CBC WITH DIFFERENTIAL/PLATELET - Abnormal; Notable for the following components:      Result Value   RBC 3.60 (*)    Hemoglobin 11.1 (*)    HCT 35.5 (*)    RDW 15.9 (*)    All other components within normal limits  COMPREHENSIVE METABOLIC PANEL - Abnormal; Notable for the following components:   Chloride 99 (*)    CO2 17 (*)    Glucose, Bld 189 (*)    BUN 109 (*)    Creatinine, Ser 4.11 (*)    ALT 160 (*)    GFR calc non Af Amer 10 (*)    GFR calc Af Amer 11 (*)    Anion gap 19 (*)    All other components within normal limits  BRAIN NATRIURETIC PEPTIDE - Abnormal; Notable for the following components:   B Natriuretic Peptide 1,605.1 (*)    All other components within normal limits  URINALYSIS, ROUTINE W REFLEX MICROSCOPIC - Abnormal; Notable for the following components:   APPearance HAZY (*)    Hgb urine dipstick LARGE (*)    Bacteria, UA RARE (*)    Squamous Epithelial / LPF 0-5 (*)    All other components within normal limits  PROTIME-INR - Abnormal; Notable for the following components:   Prothrombin Time 30.9 (*)    All other components within normal limits  I-STAT TROPONIN, ED  I-STAT CG4 LACTIC ACID, ED    EKG  EKG Interpretation  Date/Time:  Thursday July 02 2017 11:14:50 EST Ventricular Rate:  95 PR Interval:    QRS Duration: 201 QT Interval:  465 QTC Calculation: 585 R Axis:   -146 Text Interpretation:  Atrial fibrillation Left bundle branch block Borderline ST depression, lateral leads No significant change since last tracing Confirmed by Blanchie Dessert [phone]) on 07/02/2017 11:31:58 AM       Radiology Dg Chest 2 View  Result Date: 07/02/2017 CLINICAL DATA:  Productive cough and shortness of breath for the past week. EXAM: CHEST  2 VIEW COMPARISON:  Chest x-ray dated June 26, 2017. FINDINGS: Stable mild cardiomegaly. Normal pulmonary vascularity. Bibasilar scarring. No focal  consolidation, pleural effusion, or pneumothorax. No acute osseous abnormality. IMPRESSION: Stable cardiomegaly and bibasilar scarring. No active cardiopulmonary disease. Electronically Signed   By: Titus Dubin M.D.   On: 07/02/2017 12:42    Procedures Procedures (including critical care time)  Medications Ordered in ED Medications  albuterol (PROVENTIL) (2.5 MG/3ML) 0.083% nebulizer solution 5 mg (not administered)  ipratropium (ATROVENT) nebulizer solution 0.5 mg (not administered)  methylPREDNISolone sodium succinate (SOLU-MEDROL) 125 mg/2 mL injection 125 mg (not administered)  albuterol (PROVENTIL) (2.5 MG/3ML) 0.083% nebulizer solution 5 mg (5 mg Nebulization Given 07/02/17 1141)     Initial Impression / Assessment and Plan / ED Course  I have reviewed the triage vital signs and the nursing notes.  Pertinent labs & imaging results that were available during my care of the patient were reviewed by me and considered in my medical decision making (see chart for details).     Elderly female with multiple medical problems recently admitted to [facility] with CHF and UTI.  Echo done within the last week showed a stable nonischemic cardiomyopathy with EF between 20 and 25%.  Patient was discharged 3 days ago and has been declining at home.  She has had a cough, congestion and shortness of breath.  Started having fever and chills.  Concern for possible worsening CHF versus  pneumonia.  Rectal temperature pending as patient may be code sepsis.  Oral temperature was within normal limits.  EKG shows atrial fibrillation and a conduction delay but not significantly different from priors. Chest x-ray, CBC, CMP, troponin, BNP, lactate, UA pending.  Patient given albuterol to see if that improves her wheezing and shortness of breath.  2:56 PM Patient had worsening wheezing after albuterol with more coughing.  Will give a second treatment and steroids.  Labs show acute on chronic renal  failure with creatinine now 4.  Patient's CBC without significant changes.  Troponin within normal limits.  Lactate within normal limits.  INR is therapeutic.  BNP is slightly improved from 2000-1600.  UA without evidence of infection and chest x-ray without significant signs of fluid overload.  Feel that patient symptoms may be from COPD exacerbation but also in the setting of new acute renal failure will admit for further care.  Final Clinical Impressions(s) / ED Diagnoses   Final diagnoses:  Bronchitis  COPD with acute exacerbation (HCC)  Nonischemic cardiomyopathy ([place])  Chronic kidney disease, unspecified CKD stage    ED Discharge Orders    None       Blanchie Dessert, MD 07/02/17 1528

## 2017-07-02 NOTE — ED Triage Notes (Signed)
To ED via EMS for eval of continued weakness and cough. Pt was released from [facility] on Monday- orig seen there for same and found to UTI. Pt alert and oriented.

## 2017-07-02 NOTE — ED Notes (Signed)
Pt transported to xray. Breathing tx finished. Pt states she feels a little better. Pt remains on monitor

## 2017-07-02 NOTE — ED Notes (Signed)
Patient denies pain and is resting comfortably.  

## 2017-07-02 NOTE — H&P (Signed)
History and Physical    DARSHAY DEUPREE DDU:[id number] DOB: 1945-12-01 DOA: 07/02/2017  PCP: Lemmie Evens, MD  Cardiologist: Loralie Champagne MD, [facility] cardiology   Patient coming from: Home  I have personally briefly reviewed patient's old medical records in [place]  Chief Complaint: Increasing shortness of breath and weakness since discharge from the hospital.  HPI: AMI [name] is a 72 y.o. female with medical history significant of nonischemic cardiomyopathy with EF of 20% as per ECHO 1/5 2019, A. fib on Coumadin, type 2 diabetes mellitus with chronic kidney disease stage IV, anemia of chronic kidney disease, obstructive sleep apnea on CPAP, dyslipidemia and obesity presented to the ED with increasing shortness of breath since discharge from the hospital on June 30, 2017.  Was admitted on January 4 for acute on chronic combined systolic and diastolic congestive heart failure along with a UTI diagnosed by her outpatient physician repeat UA in the hospital was negative.  Echo has plantable cardio defibrillator paroxysmal atrial fibrillation obstructive sleep apnea and obesity.  Since discharge from the hospital she has done poorly according to her son and her husband.  She presented to our ER with shortness of breath but no obvious increased weight.  She cannot get up and move around and she was noted to have some wheezing.  In the emergency department she was given a nebulizer treatment and seemed to improve some with a dose of steroids as well.  Of note however is that she has an extremely poor EF of 20-25% and presented without fever, white blood cell count elevation, she did have a little bit of cough but no significant sputum production.  Her biggest complaint is shortness of breath and no energy.   ED Course: He was given nebulizers and steroids with minimal improvement of her symptoms she will be admitted to the hospital for further evaluation and management.  Review of Systems:  As per HPI otherwise 10 point review of systems negative.    Past Medical History:  Diagnosis Date  . Anemia    a. mild/chronic  . Anemia due to GI blood loss 02/26/2016  . Anemia in chronic renal disease 11/30/2014  . Cardiomyopathy, nonischemic ([place])    a. 1999 nl cath;  b. 12/05 [facility] [place];  c. 10/2005 ICD extraction 2/2 enterococcus bacteremia and Veg on RV lead;  c. 05/2008 low risk Myoview (scarring w/ some evidence of inf ischemia);  d. 11/2012 Echo: EF 15-20%;  e. 01/2013 s/p MDT [place] CRT D, ser # [id number];  f. 05/2013 Echo: EF 15%.  . Chronic systolic CHF (congestive heart failure) ([place])    a. 11/2012 Echo: EF 15-20%;  b. 05/2013 TEE EF 15%.  . CKD (chronic kidney disease), stage III ([place])    creatinin-1.44 in 1/09; 1.51 in 1/10  . Degenerative joint disease    of knees, shoulder, and hips  . Enterococcal infection    a. 10/2005 - AICD-explanted  . GERD (gastroesophageal reflux disease)   . Hilar density    a. infrahilar mass/adenopathy on CT scan 5/07; subsequently  resolved  . History of blood transfusion   . Hyperlipidemia   . Hypertension   . Hypothyroidism   . ICD (implantable cardioverter-defibrillator) infection ([place])    removed 2016  . Implantable cardioverter-defibrillator-CRT- Mdt    a.  01/2013 s/p MDT [place] CRT D, ser # [id number] - ICD has been removed  . LBBB (left bundle branch block)   . Obstructive sleep apnea  uses cpap  . PAF (paroxysmal atrial fibrillation) ([place])    a. 07/2012 s/p TEE/DCCV;  b. chronic coumadin;  c. 05/2013 Recurrent Afib->TEE/DCCV and amio initiation.  . Peripheral vascular disease ([place])   . Pneumonia 07/2005  . Pulmonary embolism ([place])    a. 07/2005 after total right hip arthroplasty  . Tobacco abuse    a. discontinued in 1997, and then resumed  . Type II diabetes mellitus ([place])    type 2  . Urinary incontinence   . Villous adenoma of colon    a. tubovillous adenomatous polyp with focal high grade dysplasia; presented with  hematochezia - followed by Dr. Laural Golden.    Past Surgical History:  Procedure Laterality Date  . A-V CARDIAC PACEMAKER INSERTION  12/05   Biventricular pacemaker/AICD  . ABDOMINAL HYSTERECTOMY  1990/92   Initial partial hysterectomy followed by BSO  . BASCILIC VEIN TRANSPOSITION Right 03/05/2017   Procedure: RIGHT 1ST STAGE BASCILIC VEIN TRANSPOSITION;  Surgeon: Serafina Mitchell, MD;  Location: [place];  Service: Vascular;  Laterality: Right;  . BASCILIC VEIN TRANSPOSITION Right 05/28/2017   Procedure: BASILIC VEIN TRANSPOSITION SECOND STAGE RIGHT;  Surgeon: Serafina Mitchell, MD;  [facility];  Service: Vascular;  Laterality: Right;  . BI-VENTRICULAR IMPLANTABLE CARDIOVERTER DEFIBRILLATOR N/A 01/24/2013   Procedure: BI-VENTRICULAR IMPLANTABLE CARDIOVERTER DEFIBRILLATOR  (CRT-D);  Surgeon: Evans Lance, MD;  Location: [facility] CATH LAB;  Service: Cardiovascular;  Laterality: N/A;  . BI-VENTRICULAR IMPLANTABLE CARDIOVERTER DEFIBRILLATOR  (CRT-D)  01/24/2013  . CARDIAC CATHETERIZATION    . CARDIOVERSION N/A 08/20/2012   Procedure: TEE GUIDED CARDIOVERSION;  Surgeon: Yehuda Savannah, MD;  Location: [facility];  Service: Cardiovascular;  Laterality: N/A;  To be done @ bedside  . CARDIOVERSION N/A 06/20/2013   Procedure: CARDIOVERSION;  Surgeon: Dorothy Spark, MD;  Location: [place];  Service: Cardiovascular;  Laterality: N/A;  . CATARACT EXTRACTION W/PHACO Left 08/07/2015   Procedure: CATARACT EXTRACTION PHACO AND INTRAOCULAR LENS PLACEMENT ([place]);  Surgeon: Rutherford Guys, MD;  Location: [facility];  Service: Ophthalmology;  Laterality: Left;  CDE: 7.88  . CATARACT EXTRACTION W/PHACO Right 08/21/2015   Procedure: CATARACT EXTRACTION PHACO AND INTRAOCULAR LENS PLACEMENT (IOC);  Surgeon: Rutherford Guys, MD;  Location: [facility];  Service: Ophthalmology;  Laterality: Right;  CDE:8.55  . COLONOSCOPY Left 10/24/2014   Procedure: COLONOSCOPY;  Surgeon: Carol Ada, MD;  Location: [facility] ENDOSCOPY;  Service: Endoscopy;   Laterality: Left;  . COLONOSCOPY W/ POLYPECTOMY  2009  . COLONOSCOPY WITH ESOPHAGOGASTRODUODENOSCOPY (EGD) N/A 06/10/2013   Procedure: COLONOSCOPY WITH ESOPHAGOGASTRODUODENOSCOPY (EGD);  Surgeon: Rogene Houston, MD;  [facility];  Service: Endoscopy;  Laterality: N/A;  925  . ESOPHAGOGASTRODUODENOSCOPY N/A 10/21/2014   Procedure: ESOPHAGOGASTRODUODENOSCOPY (EGD);  Surgeon: Inda Castle, MD;  Location: [place];  Service: Endoscopy;  Laterality: N/A;  . GIVENS CAPSULE STUDY N/A 10/24/2014   Procedure: GIVENS CAPSULE STUDY;  Surgeon: Carol Ada, MD;  Location: [place];  Service: Endoscopy;  Laterality: N/A;  . ICD LEAD REMOVAL N/A 12/13/2014   Procedure: ICD LEAD REMOVAL/EXTRACTION ;  Surgeon: Evans Lance, MD;  Location: [place];  Service: Cardiovascular;  Laterality: N/A;  [place] back up  . KNEE ARTHROSCOPY Right 1980's?  Marland Kitchen PACEMAKER REMOVAL  11/18/05   Enterococcal infection  . RIGHT HEART CATHETERIZATION N/A 04/18/2014   Procedure: RIGHT HEART CATH;  Surgeon: Larey Dresser, MD;  Location: [facility] CATH LAB;  Service: Cardiovascular;  Laterality: N/A;  . TEE WITHOUT CARDIOVERSION N/A 08/20/2012  Procedure: TRANSESOPHAGEAL ECHOCARDIOGRAM (TEE);  Surgeon: Yehuda Savannah, MD;  [facility];  Service: Cardiovascular;  Laterality: N/A;  . TEE WITHOUT CARDIOVERSION N/A 06/20/2013   Procedure: TRANSESOPHAGEAL ECHOCARDIOGRAM (TEE);  Surgeon: Dorothy Spark, MD;  Location: [place];  Service: Cardiovascular;  Laterality: N/A;  . TEE WITHOUT CARDIOVERSION N/A 12/08/2014   Procedure: TRANSESOPHAGEAL ECHOCARDIOGRAM (TEE);  Surgeon: Fay Records, MD;  [facility];  Service: Cardiovascular;  Laterality: N/A;  . TOTAL HIP ARTHROPLASTY Right 07/2005  . TUBAL LIGATION  1980's     reports that she has quit smoking. Her smoking use included cigarettes. She quit after 12.00 years of use. she has never used smokeless tobacco. She reports that she does not drink alcohol  or use drugs.  No Known Allergies  Family History  Problem Relation Age of Onset  . Hypertension Mother   . Diabetes Mother   . Coronary artery disease Father   . Diabetes Brother   . Hypertension Brother   . Lung cancer Brother   . Arthritis Other   . Diabetes Other   . Heart disease Other        female < 55    Prior to Admission medications   Medication Sig Start Date End Date Taking? Authorizing Provider  amiodarone (PACERONE) 200 MG tablet Take 100 mg by mouth daily.  02/25/17  Yes [provider]  calcitRIOL (ROCALTROL) 0.25 MCG capsule Take 0.25 mcg by mouth daily. 06/09/16  Yes [provider]  carvedilol (COREG) 6.25 MG tablet TAKE 1 TABLET BY MOUTH TWICE DAILY WITH A MEAL. 06/13/16  Yes Larey Dresser, MD  colchicine 0.6 MG tablet Take 0.6 mg by mouth 2 (two) times daily as needed (gout). Reported on 11/06/2015 12/20/13  Yes [provider]  feeding supplement, ENSURE ENLIVE, (ENSURE ENLIVE) LIQD Take 237 mLs by mouth 2 (two) times daily between meals. Patient taking differently: Take 237 mLs by mouth daily with lunch.  10/26/14  Yes Riccardo Dubin, MD  ferrous sulfate 324 (65 FE) MG TBEC Take 1 tablet (325 mg total) by mouth 2 (two) times daily. 12/02/14  Yes Tat, Shanon Brow, MD  furosemide (LASIX) 40 MG tablet TAKE 1 TABLET BY MOUTH TWICE DAILY. 05/27/16  Yes Larey Dresser, MD  glimepiride (AMARYL) 2 MG tablet Take 2 mg by mouth daily with breakfast.  12/05/14  Yes [provider]  guaiFENesin (MUCINEX) 600 MG 12 hr tablet Take 1 tablet (600 mg total) by mouth 2 (two) times daily. 06/30/17  Yes Dhungel, Nishant, MD  guaiFENesin-dextromethorphan (ROBITUSSIN DM) 100-10 MG/5ML syrup Take 5 mLs by mouth every 4 (four) hours as needed for cough. 06/30/17  Yes Dhungel, Nishant, MD  isosorbide mononitrate (IMDUR) 30 MG 24 hr tablet Take 1 tablet (30 mg total) by mouth daily. 10/26/14  Yes Riccardo Dubin, MD  levothyroxine (SYNTHROID, LEVOTHROID) 50 MCG tablet  Take 50 mcg by mouth daily before breakfast. 02/25/17  Yes [provider]  lisinopril (PRINIVIL,ZESTRIL) 10 MG tablet Take 1 tablet (10 mg total) by mouth daily. Patient taking differently: Take 5 mg by mouth daily.  06/30/17  Yes Dhungel, Nishant, MD  metoCLOPramide (REGLAN) 5 MG tablet Take 5 mg by mouth 4 (four) times daily.   Yes [provider]  ondansetron (ZOFRAN) 4 MG tablet Take 4 mg by mouth every 4 (four) hours as needed for nausea or vomiting. Reported on 11/06/2015   Yes [provider]  oxyCODONE-acetaminophen (PERCOCET) 10-325 MG per tablet  Take 1 tablet by mouth every 6 (six) hours as needed for pain.  12/09/13  Yes [provider]  polyethylene glycol powder (GLYCOLAX/MIRALAX) powder Take 17 g by mouth daily. 06/30/17  Yes Dhungel, Nishant, MD  pravastatin (PRAVACHOL) 40 MG tablet Take 80 mg by mouth at bedtime.    Yes [provider]  senna-docusate (SENOKOT-S) 8.6-50 MG tablet Take 2 tablets by mouth daily as needed for mild constipation. 06/30/17 06/30/18 Yes Dhungel, Nishant, MD  spironolactone (ALDACTONE) 25 MG tablet TAKE 1/2 TABLET BY MOUTH DAILY. 03/18/16  Yes Bensimhon, Shaune Pascal, MD  warfarin (COUMADIN) 2.5 MG tablet Take 0.5-1 tablets (1.25-2.5 mg total) by mouth daily at 6 PM. Takes 1 tablet on Mon, Wed, Fri, take 0.5 tablet on all other days Patient taking differently: Take 1.25-2.5 mg by mouth daily at 6 PM. Take 0.5 tablet (1.25 mg) by mouth on Monday, Wednesday, & Friday then Take 1 tablet (2.5 mg) on all other days Sunday, Tuesday, Thursday, & Saturday 01/10/15  Yes Samuella Cota, MD    Physical Exam: Vitals:   07/02/17 1430 07/02/17 1445 07/02/17 1500 07/02/17 1515  BP: 101/78 107/70 112/87 110/82  Pulse: 73 64 89 99  Resp:      Temp:      TempSrc:      SpO2: 97% 95% 98% 98%  Weight:      Height:       .TCS Constitutional: NAD, calm, comfortable Vitals:   07/02/17 1430 07/02/17 1445 07/02/17 1500 07/02/17 1515  BP:  101/78 107/70 112/87 110/82  Pulse: 73 64 89 99  Resp:      Temp:      TempSrc:      SpO2: 97% 95% 98% 98%  Weight:      Height:       Eyes: PERRL, lids and conjunctivae normal ENMT: Mucous membranes are dry. Posterior pharynx clear of any exudate or lesions.Normal dentition.  Neck: normal, supple, no masses, no thyromegaly Respiratory: clear to auscultation bilaterally, no wheezing, no crackles. Normal respiratory effort. No accessory muscle use.  Cardiovascular: Irregularly irregular rate and rhythm, no murmurs / rubs / gallops. No extremity edema. 2+ pedal pulses. No carotid bruits.  Abdomen: no tenderness, no masses palpated. No hepatosplenomegaly. Bowel sounds positive.  Musculoskeletal: no clubbing / cyanosis. No joint deformity upper and lower extremities. Good ROM, no contractures. Normal muscle tone.  Skin: no rashes, lesions, ulcers. No induration, poor skin turgor Neurologic: CN 2-12 grossly intact. Sensation intact, DTR normal. Strength 5/5 in all 4.  Psychiatric: Normal judgment and insight. Alert and oriented x 3. Normal mood.     Labs on Admission: I have personally reviewed following labs and imaging studies  CBC: Recent Labs  Lab 06/26/17 0806 06/29/17 0536 06/30/17 0505 07/02/17 1131  WBC 8.0  --  5.5 7.4  NEUTROABS 6.4  --   --  5.8  HGB 10.5* 10.5* 10.8* 11.1*  HCT 34.0* 33.6* 34.1* 35.5*  MCV 100.3*  --  99.4 98.6  PLT 242  --  206 710   Basic Metabolic Panel: Recent Labs  Lab 06/26/17 0806 06/27/17 0600 06/28/17 0611 06/29/17 0536 06/30/17 0505 07/02/17 1131  NA 140 139 139 136 135 135  K 4.4 4.6 4.4 4.0 4.0 4.4  CL 105 106 105 101 100* 99*  CO2 18* 20* 21* 22 21* 17*  GLUCOSE 212* 129* 119* 156* 135* 189*  BUN 70* 81* 93* 95* 94* 109*  CREATININE 3.01* 3.38* 3.51* 3.57* 3.42* 4.11*  CALCIUM 9.9 9.4 9.5 9.0 9.0 9.5  MG 2.4  --   --   --   --   --    GFR: Estimated Creatinine Clearance: 13.1 mL/min (A) (by C-G formula based on SCr of 4.11  mg/dL (H)). Liver Function Tests: Recent Labs  Lab 06/26/17 0806 07/02/17 1131  AST 32 36  ALT 28 160*  ALKPHOS 49 52  BILITOT 1.3* 1.1  PROT 7.1 6.8  ALBUMIN 3.8 3.6   No results for input(s): LIPASE, AMYLASE in the last 168 hours. No results for input(s): AMMONIA in the last 168 hours. Coagulation Profile: Recent Labs  Lab 06/27/17 0600 06/28/17 0611 06/29/17 0536 06/30/17 0505 07/02/17 1354  INR 3.97 3.51 2.44 1.79 3.00   Cardiac Enzymes: Recent Labs  Lab 06/26/17 0806  TROPONINI 0.05*   BNP (last 3 results) No results for input(s): PROBNP in the last 8760 hours. HbA1C: No results for input(s): HGBA1C in the last 72 hours. CBG: No results for input(s): GLUCAP in the last 168 hours. Lipid Profile: No results for input(s): CHOL, HDL, LDLCALC, TRIG, CHOLHDL, LDLDIRECT in the last 72 hours. Thyroid Function Tests: No results for input(s): TSH, T4TOTAL, FREET4, T3FREE, THYROIDAB in the last 72 hours. Anemia Panel: No results for input(s): VITAMINB12, FOLATE, FERRITIN, TIBC, IRON, RETICCTPCT in the last 72 hours. Urine analysis:    Component Value Date/Time   COLORURINE YELLOW 07/02/2017 1123   APPEARANCEUR HAZY (A) 07/02/2017 1123   LABSPEC 1.013 07/02/2017 1123   PHURINE 5.0 07/02/2017 1123   GLUCOSEU NEGATIVE 07/02/2017 1123   HGBUR LARGE (A) 07/02/2017 1123   BILIRUBINUR NEGATIVE 07/02/2017 1123   KETONESUR NEGATIVE 07/02/2017 1123   PROTEINUR NEGATIVE 07/02/2017 1123   UROBILINOGEN 0.2 01/08/2015 0940   NITRITE NEGATIVE 07/02/2017 1123   LEUKOCYTESUR NEGATIVE 07/02/2017 1123    Radiological Exams on Admission: Dg Chest 2 View  Result Date: 07/02/2017 CLINICAL DATA:  Productive cough and shortness of breath for the past week. EXAM: CHEST  2 VIEW COMPARISON:  Chest x-ray dated June 26, 2017. FINDINGS: Stable mild cardiomegaly. Normal pulmonary vascularity. Bibasilar scarring. No focal consolidation, pleural effusion, or pneumothorax. No acute osseous  abnormality. IMPRESSION: Stable cardiomegaly and bibasilar scarring. No active cardiopulmonary disease. Electronically Signed   By: Titus Dubin M.D.   On: 07/02/2017 12:42    EKG: Independently reviewed.  Atrial fibrillation with left bundle branch block no significant change from prior  Echocardiogram 06/28/1094 showed a systolic ejection fraction of 20-25%  Assessment/Plan Principal Problem:   COPD (chronic obstructive pulmonary disease) (HCC) Active Problems:   Acute on chronic systolic CHF (congestive heart failure), NYHA class 4 (HCC)   Type 2 diabetes, uncontrolled, with renal manifestation (HCC)   Chronic renal disease, stage 4, severely decreased glomerular filtration rate (GFR) between 15-29 mL/min/1.73 square meter (HCC)   Atrial fibrillation (HCC)   Anemia in chronic renal disease   SOB (shortness of breath)   Dyslipidemia   HTN (hypertension)   History of pulmonary embolism   Chronic anticoagulation   Implantable cardioverter-defibrillator-CRT- Mdt   Hypothyroidism   1.  COPD exacerbation: Hopefully patient is having a COPD exacerbation.  We are starting her on steroids and nebulizers.  I am concerned however that if this does not have her significantly getting better that this may really just be a manifestation of worsening congestive heart failure in a patient who needs palliative care.  2.  Acute on chronic systolic congestive heart failure New York Heart Association class IV: We will  consult cardiology and palliative care.  I am concerned as the patient shows no signs of significantly decompensated congestive heart failure she does not appear to be volume overloaded however her BNP is elevated.  We will restart her on 40 of Lasix IV.  3.  Type 2 diabetes uncontrolled with renal manifestations: Patient to get fingerstick blood glucoses before meals and at bedtime we will add some nocturnal at bedtime insulin as well as mealtime insulin as we are starting her on  steroids.  4.  Chronic renal disease stage IV severely decreased GFR between 15 and 29: Ominous for cardiorenal syndrome in a patient with severely decompensated congestive heart failure.  We will avoid nephrotoxins and monitor closely  5.  Atrial fibrillation: Continue anticoagulation and rate control  6.  Anemia and chronic renal disease: Continue iron may require erythropoietin.  7.  Shortness of breath: Patient may have a viral URI which exacerbated her COPD or this may be a worsening congestive heart failure situation will monitor and follow closely.  8.  Dyslipidemia: Continue home lipid therapy  9.  Hypertension: New home blood pressure management  10.  History of pulmonary embolism on chronic anticoagulation: Continue anticoagulation  11.  Chronic anticoagulation: Noted  12.  Implantable cardio defibrillator CRT [employer]: Noted  13.  Hypothyroidism: Continue levothyroxine  14.  Goals of care discussion: Patient family would benefit from a discussion with palliative care.  I have consulted them.  Presently she remains full code.  If this turns out to be congestive heart failure exacerbation patient would benefit from hospice care at this point.    DVT prophylaxis: Chronically anticoagulated Code Status: Full code Family Communication: Spoke with patient's husband and son at the bedside Disposition Plan: Discharge in 3-4 days unsure about whether home or skilled Consults called: Cardiology and palliative care Admission status: Inpatient   Lady Deutscher MD [facility] Pager [phone]  If 7PM-7AM, please contact night-coverage [email or web address] Password [email or web address]  07/02/2017, 5:09 PM

## 2017-07-02 NOTE — ED Notes (Signed)
Pt to go to 3E 18.

## 2017-07-03 ENCOUNTER — Other Ambulatory Visit: Payer: Self-pay

## 2017-07-03 DIAGNOSIS — J40 Bronchitis, not specified as acute or chronic: Secondary | ICD-10-CM

## 2017-07-03 LAB — CBC
HCT: 33.9 % — ABNORMAL LOW (ref 36.0–46.0)
Hemoglobin: 10.6 g/dL — ABNORMAL LOW (ref 12.0–15.0)
MCH: 30.5 pg (ref 26.0–34.0)
MCHC: 31.3 g/dL (ref 30.0–36.0)
MCV: 97.4 fL (ref 78.0–100.0)
PLATELETS: 207 10*3/uL (ref 150–400)
RBC: 3.48 MIL/uL — ABNORMAL LOW (ref 3.87–5.11)
RDW: 16.1 % — AB (ref 11.5–15.5)
WBC: 4.3 10*3/uL (ref 4.0–10.5)

## 2017-07-03 LAB — HEMOGLOBIN A1C
Hgb A1c MFr Bld: 6.6 % — ABNORMAL HIGH (ref 4.8–5.6)
Mean Plasma Glucose: 142.72 mg/dL

## 2017-07-03 LAB — BASIC METABOLIC PANEL
Anion gap: 16 — ABNORMAL HIGH (ref 5–15)
BUN: 115 mg/dL — AB (ref 6–20)
CALCIUM: 9.3 mg/dL (ref 8.9–10.3)
CO2: 19 mmol/L — ABNORMAL LOW (ref 22–32)
Chloride: 99 mmol/L — ABNORMAL LOW (ref 101–111)
Creatinine, Ser: 4.14 mg/dL — ABNORMAL HIGH (ref 0.44–1.00)
GFR calc Af Amer: 11 mL/min — ABNORMAL LOW (ref >60)
GFR calc non Af Amer: 10 mL/min — ABNORMAL LOW (ref >60)
GLUCOSE: 241 mg/dL — AB (ref 65–99)
Potassium: 4.5 mmol/L (ref 3.5–5.1)
Sodium: 134 mmol/L — ABNORMAL LOW (ref 135–145)

## 2017-07-03 LAB — MAGNESIUM: MAGNESIUM: 3.3 mg/dL — AB (ref 1.7–2.4)

## 2017-07-03 LAB — GLUCOSE, CAPILLARY
GLUCOSE-CAPILLARY: 326 mg/dL — AB (ref 65–99)
GLUCOSE-CAPILLARY: 336 mg/dL — AB (ref 65–99)
GLUCOSE-CAPILLARY: 299 mg/dL — AB (ref 65–99)
Glucose-Capillary: 229 mg/dL — ABNORMAL HIGH (ref 65–99)

## 2017-07-03 LAB — BRAIN NATRIURETIC PEPTIDE: B NATRIURETIC PEPTIDE 5: 2179.2 pg/mL — AB (ref 0.0–100.0)

## 2017-07-03 LAB — PROTIME-INR
INR: 3.37
PROTHROMBIN TIME: 33.9 s — AB (ref 11.4–15.2)

## 2017-07-03 MED ORDER — CARVEDILOL 6.25 MG PO TABS
6.2500 mg | ORAL_TABLET | Freq: Two times a day (BID) | ORAL | Status: DC
  Administered 2017-07-04 – 2017-07-06 (×5): 6.25 mg via ORAL
  Filled 2017-07-03 (×5): qty 1

## 2017-07-03 MED ORDER — WARFARIN 1.25 MG HALF TABLET
1.2500 mg | ORAL_TABLET | ORAL | Status: DC
  Filled 2017-07-03: qty 1

## 2017-07-03 MED ORDER — WARFARIN - PHYSICIAN DOSING INPATIENT: Freq: Every day | Status: DC

## 2017-07-03 MED ORDER — INSULIN GLARGINE 100 UNIT/ML ~~LOC~~ SOLN
14.0000 [IU] | Freq: Every day | SUBCUTANEOUS | Status: DC
  Administered 2017-07-03 – 2017-07-08 (×6): 14 [IU] via SUBCUTANEOUS
  Filled 2017-07-03 (×7): qty 0.14

## 2017-07-03 MED ORDER — INSULIN ASPART 100 UNIT/ML ~~LOC~~ SOLN
4.0000 [IU] | Freq: Three times a day (TID) | SUBCUTANEOUS | Status: DC
  Administered 2017-07-04: 4 [IU] via SUBCUTANEOUS

## 2017-07-03 MED ORDER — ENSURE ENLIVE PO LIQD
237.0000 mL | Freq: Three times a day (TID) | ORAL | Status: DC
  Administered 2017-07-06 – 2017-07-08 (×3): 237 mL via ORAL

## 2017-07-03 MED ORDER — WARFARIN SODIUM 2.5 MG PO TABS
2.5000 mg | ORAL_TABLET | ORAL | Status: DC
  Administered 2017-07-03: 2.5 mg via ORAL
  Filled 2017-07-03: qty 1

## 2017-07-03 MED ORDER — IPRATROPIUM-ALBUTEROL 0.5-2.5 (3) MG/3ML IN SOLN
3.0000 mL | Freq: Two times a day (BID) | RESPIRATORY_TRACT | Status: DC
  Administered 2017-07-03 – 2017-07-06 (×6): 3 mL via RESPIRATORY_TRACT
  Filled 2017-07-03 (×5): qty 3

## 2017-07-03 MED ORDER — ADULT MULTIVITAMIN W/MINERALS CH
1.0000 | ORAL_TABLET | Freq: Every day | ORAL | Status: DC
  Administered 2017-07-04 – 2017-07-15 (×12): 1 via ORAL
  Filled 2017-07-03 (×13): qty 1

## 2017-07-03 MED ORDER — INSULIN ASPART 100 UNIT/ML ~~LOC~~ SOLN
0.0000 [IU] | Freq: Three times a day (TID) | SUBCUTANEOUS | Status: DC
  Administered 2017-07-04: 15 [IU] via SUBCUTANEOUS
  Administered 2017-07-04: 7 [IU] via SUBCUTANEOUS
  Administered 2017-07-05: 11 [IU] via SUBCUTANEOUS
  Administered 2017-07-05 – 2017-07-06 (×3): 7 [IU] via SUBCUTANEOUS
  Administered 2017-07-06: 4 [IU] via SUBCUTANEOUS
  Administered 2017-07-07 – 2017-07-08 (×3): 3 [IU] via SUBCUTANEOUS
  Administered 2017-07-09: 4 [IU] via SUBCUTANEOUS
  Administered 2017-07-09 – 2017-07-10 (×2): 3 [IU] via SUBCUTANEOUS
  Administered 2017-07-11 – 2017-07-12 (×2): 4 [IU] via SUBCUTANEOUS
  Administered 2017-07-12 – 2017-07-13 (×2): 3 [IU] via SUBCUTANEOUS
  Administered 2017-07-13 (×2): 4 [IU] via SUBCUTANEOUS
  Administered 2017-07-14: 7 [IU] via SUBCUTANEOUS
  Administered 2017-07-14: 1 [IU] via SUBCUTANEOUS
  Administered 2017-07-15: 4 [IU] via SUBCUTANEOUS

## 2017-07-03 NOTE — Progress Notes (Signed)
Palliative Medicine consult noted. Due to high referral volume, there may be a delay seeing this patient. Please call the Palliative Medicine Team office at [phone] if recommendations are needed in the interim.  Thank you for inviting [place] to see this patient.  Marjie Skiff Lafe Clerk, RN, BSN, [facility] Palliative Medicine Team 07/03/2017 9:19 AM Office [phone]

## 2017-07-03 NOTE — Progress Notes (Addendum)
Inpatient Diabetes Program Recommendations  AACE/ADA: New Consensus Statement on Inpatient Glycemic Control (2015)  Target Ranges:  Prepandial:   less than 140 mg/dL      Peak postprandial:   less than 180 mg/dL (1-2 hours)      Critically ill patients:  140 - 180 mg/dL   Lab Results  Component Value Date   GLUCAP 326 (H) 07/03/2017   HGBA1C 6.6 (H) 07/03/2017   Review of Glycemic Control  Diabetes history: DM 2 Outpatient Diabetes medications: Amaryl 2 mg Daily Current orders for Inpatient glycemic control: Novolog Resistant Correction 0-20 units tid + Novolog HS scale 0-5 units + Novolog 4 units tid meal coverage, Amaryl 2 mg Daily  Inpatient Diabetes Program Recommendations:    IV Solumedrol 40 mg Q6 hours. Fasting glucose this am 229 mg/dl. Lunchtime glucose 326 with 11 units of Novolog given (correction + Meal coverage). While patient is taking steroids consider low dose basal insulin, Lantus 14 units Daily starting now.   Thanks,  Tama Headings RN, MSN, [facility] Inpatient Diabetes Coordinator Team Pager [phone] (8a-5p)

## 2017-07-03 NOTE — Progress Notes (Signed)
PROGRESS NOTE  [name] HBZ:[id number] DOB: Feb 22, 1946 DOA: 07/02/2017 PCP: Lemmie Evens, MD  HPI/Recap of past 24 hours: HPI from Dr Randa Spike on 07/02/17  [name] is a 72 y.o. female with medical history significant of NICM with EF of 20% s/p ICD, A. 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 Since discharge from the hospital she has done poorly according to her son and her husband.  She presented to our ER with worsening shortness of breath and generalized fatigue. In the ED, she was given a nebulizer treatment and seemed to improve some with a dose of steroid. Pt admitted for further management.  Assessment/Plan: Principal Problem:   COPD (chronic obstructive pulmonary disease) (HCC) Active Problems:   Dyslipidemia   HTN (hypertension)   History of pulmonary embolism   Type 2 diabetes, uncontrolled, with renal manifestation (HCC)   Chronic renal disease, stage 4, severely decreased glomerular filtration rate (GFR) between 15-29 mL/min/1.73 square meter (HCC)   Chronic anticoagulation   Atrial fibrillation (HCC)   Implantable cardioverter-defibrillator-CRT- Mdt   Anemia in chronic renal disease   Hypothyroidism   SOB (shortness of breath)   Acute on chronic systolic CHF (congestive heart failure), NYHA class 4 (HCC)  COPD exacerbation Worsening SOB likely multifactorial due to chronic med condition No cough, afebrile, no leukocytosis CXR: Mild cardiomegaly, no vas congestion Continue duonebs, solu-medrol PRN O2   Acute on chronic systolic HF [place] Heart Association class IV Doesn't appear overloaded BNP 2179, CXR as above ECHO EF 20-25%, s/p ICD  Will hold lasix PO 40 mg daily, and lisinopril,  continue coreg and imdur Strict I&O, daily weights Cardiology consulted, palliative consulted  Acute on CKD stage IV Cr elevated to 4, baseline around 3.5 Held lisinopril and lasix, avoid nephrotoxins and monitor closely Consider nephrology consult if continues to worsen  Chronic Afib Rate controlled Continue coumadin, coreg, amiodarone  Type 2 diabetes A1c 6.6 SSI, lantus, TID aspart Hypoglycemic protocol  Anemia of chronic renal disease Continue PO iron  Dyslipidemia Continue pravastatin  Hypertension BP soft, held lisinopril Continue coreg, imdur  Hypothyroidism Continue levothyroxine  Goals of care discussion Palliative consulted   Code Status:  Full  Family Communication: Multiple family members including husband, grandkids  Disposition Plan: Recommend SNF, pt and family refusing   Consultants:  Cardiology  Palliative  Procedures:  None  Antimicrobials:  None  DVT prophylaxis:  Coumadin   Objective: Vitals:   07/03/17 0459 07/03/17 0753 07/03/17 1000 07/03/17 1134  BP: 122/72  110/70 100/86  Pulse: 67 (!) 103  (!) 56  Resp: 20 16    Temp: 98 F (36.7 C)     TempSrc: Oral     SpO2: 100% 97% 97% 94%  Weight: 86.8 kg (191 lb 5.8 oz)     Height:        Intake/Output Summary (Last 24 hours) at 07/03/2017 1435 Last data filed at 07/03/2017 1100 Gross per 24 hour  Intake 480 ml  Output 800 ml  Net -320 ml   Filed Weights   07/02/17 1326 07/02/17 2238 07/03/17 0459  Weight: 85.3 kg (188 lb) 87.3 kg (192 lb 7.4 oz) 86.8 kg (191 lb 5.8 oz)    Exam:   General: Alert, awake, NAD  Cardiovascular: Irregular irregular  Respiratory: Clear to auscultation bilaterally  Abdomen:  Soft, non-tender, non-distended, BS present  Musculoskeletal: No pedal edema  Skin: Normal  Psychiatry: Normal mood   Data Reviewed: CBC: Recent Labs  Lab 06/29/17 0536 06/30/17 0505 07/02/17 1131 07/03/17 0501  WBC  --  5.5 7.4 4.3    NEUTROABS  --   --  5.8  --   HGB 10.5* 10.8* 11.1* 10.6*  HCT 33.6* 34.1* 35.5* 33.9*  MCV  --  99.4 98.6 97.4  PLT  --  206 242 440   Basic Metabolic Panel: Recent Labs  Lab 06/28/17 0611 06/29/17 0536 06/30/17 0505 07/02/17 1131 07/03/17 0501  NA 139 136 135 135 134*  K 4.4 4.0 4.0 4.4 4.5  CL 105 101 100* 99* 99*  CO2 21* 22 21* 17* 19*  GLUCOSE 119* 156* 135* 189* 241*  BUN 93* 95* 94* 109* 115*  CREATININE 3.51* 3.57* 3.42* 4.11* 4.14*  CALCIUM 9.5 9.0 9.0 9.5 9.3  MG  --   --   --   --  3.3*   GFR: Estimated Creatinine Clearance: 13.2 mL/min (A) (by C-G formula based on SCr of 4.14 mg/dL (H)). Liver Function Tests: Recent Labs  Lab 07/02/17 1131  AST 36  ALT 160*  ALKPHOS 52  BILITOT 1.1  PROT 6.8  ALBUMIN 3.6   No results for input(s): LIPASE, AMYLASE in the last 168 hours. No results for input(s): AMMONIA in the last 168 hours. Coagulation Profile: Recent Labs  Lab 06/28/17 0611 06/29/17 0536 06/30/17 0505 07/02/17 1354 07/03/17 0501  INR 3.51 2.44 1.79 3.00 3.37   Cardiac Enzymes: No results for input(s): CKTOTAL, CKMB, CKMBINDEX, TROPONINI in the last 168 hours. BNP (last 3 results) No results for input(s): PROBNP in the last 8760 hours. HbA1C: Recent Labs    07/03/17 0501  HGBA1C 6.6*   CBG: Recent Labs  Lab 07/02/17 1806 07/02/17 2336 07/03/17 0730 07/03/17 1134  GLUCAP 168* 248* 229* 326*   Lipid Profile: No results for input(s): CHOL, HDL, LDLCALC, TRIG, CHOLHDL, LDLDIRECT in the last 72 hours. Thyroid Function Tests: No results for input(s): TSH, T4TOTAL, FREET4, T3FREE, THYROIDAB in the last 72 hours. Anemia Panel: No results for input(s): VITAMINB12, FOLATE, FERRITIN, TIBC, IRON, RETICCTPCT in the last 72 hours. Urine analysis:    Component Value Date/Time   COLORURINE YELLOW 07/02/2017 1123   APPEARANCEUR HAZY (A) 07/02/2017 1123   LABSPEC 1.013 07/02/2017 1123   PHURINE 5.0 07/02/2017 1123   GLUCOSEU NEGATIVE  07/02/2017 1123   HGBUR LARGE (A) 07/02/2017 1123   BILIRUBINUR NEGATIVE 07/02/2017 1123   KETONESUR NEGATIVE 07/02/2017 1123   PROTEINUR NEGATIVE 07/02/2017 1123   UROBILINOGEN 0.2 01/08/2015 0940   NITRITE NEGATIVE 07/02/2017 1123   LEUKOCYTESUR NEGATIVE 07/02/2017 1123   Sepsis Labs: @LABRCNTIP (procalcitonin:4,lacticidven:4)  ) Recent Results (from the past 240 hour(s))  Urine culture     Status: None   Collection Time: 06/26/17  8:30 AM  Result Value Ref Range Status   Specimen Description URINE, CLEAN CATCH  Final   Special Requests NONE  Final   Culture   Final    NO GROWTH Performed at [facility], [place] [address], [place], [place] [address]    Report Status 06/27/2017 FINAL  Final  MRSA PCR Screening     Status: None   Collection Time: 06/26/17  5:46 PM  Result Value Ref Range Status   MRSA by PCR NEGATIVE NEGATIVE Final    Comment:  The GeneXpert MRSA Assay (FDA approved for NASAL specimens only), is one component of a comprehensive MRSA colonization surveillance program. It is not intended to diagnose MRSA infection nor to guide or monitor treatment for MRSA infections.       Studies: No results found.  Scheduled Meds: . amiodarone  100 mg Oral Daily  . aspirin EC  81 mg Oral Daily  . calcitRIOL  0.25 mcg Oral Daily  . carvedilol  6.25 mg Oral BID WC  . feeding supplement (ENSURE ENLIVE)  237 mL Oral Q lunch  . ferrous sulfate  325 mg Oral BID  . furosemide  40 mg Oral BID  . glimepiride  2 mg Oral Q breakfast  . guaiFENesin  600 mg Oral BID  . insulin aspart  0-20 Units Subcutaneous TID WC  . insulin aspart  0-5 Units Subcutaneous QHS  . insulin aspart  4 Units Subcutaneous TID WC  . insulin glargine  14 Units Subcutaneous Daily  . ipratropium-albuterol  3 mL Nebulization BID  . isosorbide mononitrate  30 mg Oral Daily  . levothyroxine  50 mcg Oral QAC breakfast  . lisinopril  5 mg Oral Daily  . methylPREDNISolone (SOLU-MEDROL)  injection  40 mg Intravenous Q6H  . polyethylene glycol  17 g Oral Daily  . pravastatin  80 mg Oral QHS  . sodium chloride flush  3 mL Intravenous Q12H  . sodium chloride flush  3 mL Intravenous Q12H    Continuous Infusions: . sodium chloride       LOS: 1 day     Alma Friendly, MD [facility]   If 7PM-7AM, please contact night-coverage [email or web address] Password [email or web address] 07/03/2017, 2:35 PM

## 2017-07-03 NOTE — Progress Notes (Signed)
PT Cancellation Note  Patient Details Name: [name] MRN: [id number] DOB: 02-14-1946   Cancelled Treatment:    Reason Eval/Treat Not Completed: Fatigue/lethargy limiting ability to participate. Pt declining despite max encouragement and education on importance of mobility. Will follow-up for PT evaluation as time allows.  Mabeline Caras, PT, DPT Acute Rehab Services  Pager: [place] 07/03/2017, 10:30 AM

## 2017-07-03 NOTE — Progress Notes (Signed)
ANTICOAGULATION CONSULT NOTE - Initial Consult  Pharmacy Consult for warfarin Indication: atrial fibrillation and VTE prophylaxis  No Known Allergies  Patient Measurements: Height: 5\' 5"  (165.1 cm) Weight: 191 lb 5.8 oz (86.8 kg) IBW/kg (Calculated) : 57   Vital Signs: Temp: 98 F (36.7 C) (01/11 0459) Temp Source: Oral (01/11 0459) BP: 110/70 (01/11 1000) Pulse Rate: 103 (01/11 0753)  Labs: Recent Labs    07/02/17 1131 07/02/17 1354 07/03/17 0501  HGB 11.1*  --  10.6*  HCT 35.5*  --  33.9*  PLT 242  --  207  LABPROT  --  30.9* 33.9*  INR  --  3.00 3.37  CREATININE 4.11*  --  4.14*    Estimated Creatinine Clearance: 13.2 mL/min (A) (by C-G formula based on SCr of 4.14 mg/dL (H)).   Medical History: Past Medical History:  Diagnosis Date  . Anemia    a. mild/chronic  . Anemia due to GI blood loss 02/26/2016  . Anemia in chronic renal disease 11/30/2014  . Cardiomyopathy, nonischemic ([place])    a. 1999 nl cath;  b. 12/05 Guidant Gross;  c. 10/2005 ICD extraction 2/2 enterococcus bacteremia and Veg on RV lead;  c. 05/2008 low risk Myoview (scarring w/ some evidence of inf ischemia);  d. 11/2012 Echo: EF 15-20%;  e. 01/2013 s/p MDT [place] CRT D, ser # [id number];  f. 05/2013 Echo: EF 15%.  . Chronic systolic CHF (congestive heart failure) ([place])    a. 11/2012 Echo: EF 15-20%;  b. 05/2013 TEE EF 15%.  . CKD (chronic kidney disease), stage III ([place])    creatinin-1.44 in 1/09; 1.51 in 1/10  . Degenerative joint disease    of knees, shoulder, and hips  . Enterococcal infection    a. 10/2005 - AICD-explanted  . GERD (gastroesophageal reflux disease)   . Hilar density    a. infrahilar mass/adenopathy on CT scan 5/07; subsequently  resolved  . History of blood transfusion   . Hyperlipidemia   . Hypertension   . Hypothyroidism   . ICD (implantable cardioverter-defibrillator) infection ([place])    removed 2016  . Implantable cardioverter-defibrillator-CRT- Mdt    a.  01/2013 s/p  MDT [place] CRT D, ser # [id number] - ICD has been removed  . LBBB (left bundle branch block)   . Obstructive sleep apnea    uses cpap  . PAF (paroxysmal atrial fibrillation) ([place])    a. 07/2012 s/p TEE/DCCV;  b. chronic coumadin;  c. 05/2013 Recurrent Afib->TEE/DCCV and amio initiation.  . Peripheral vascular disease ([place])   . Pneumonia 07/2005  . Pulmonary embolism ([place])    a. 07/2005 after total right hip arthroplasty  . Tobacco abuse    a. discontinued in 1997, and then resumed  . Type II diabetes mellitus ([place])    type 2  . Urinary incontinence   . Villous adenoma of colon    a. tubovillous adenomatous polyp with focal high grade dysplasia; presented with hematochezia - followed by Dr. Laural Golden.    Medications:  Scheduled:  . amiodarone  100 mg Oral Daily  . aspirin EC  81 mg Oral Daily  . calcitRIOL  0.25 mcg Oral Daily  . carvedilol  6.25 mg Oral BID WC  . feeding supplement (ENSURE ENLIVE)  237 mL Oral Q lunch  . ferrous sulfate  325 mg Oral BID  . furosemide  40 mg Oral BID  . glimepiride  2 mg Oral Q breakfast  . guaiFENesin  600 mg Oral BID  .  insulin aspart  0-20 Units Subcutaneous TID WC  . insulin aspart  0-5 Units Subcutaneous QHS  . insulin aspart  4 Units Subcutaneous TID WC  . ipratropium-albuterol  3 mL Nebulization BID  . isosorbide mononitrate  30 mg Oral Daily  . levothyroxine  50 mcg Oral QAC breakfast  . lisinopril  5 mg Oral Daily  . methylPREDNISolone (SOLU-MEDROL) injection  40 mg Intravenous Q6H  . polyethylene glycol  17 g Oral Daily  . pravastatin  80 mg Oral QHS  . sodium chloride flush  3 mL Intravenous Q12H  . sodium chloride flush  3 mL Intravenous Q12H    Assessment: 72 y.o female presented with SOB and recent admit on 1/4 for CHF exacerbation.  On warfarin for afib prior to admission with therapeutic INR 3 upon admission. At home dose using 2.5mg  tablets 1/2 tab (1.25mg ) MWF and 1 tab (2.5mg ) all other days (weekly dose 13.75mg ).  INR  slightly elevated today 1/11 at 3.37.  Hgb 10.6 and trending down with no signs/symptoms of bleeding.  Goal of Therapy:  INR 2-3 Monitor platelets by anticoagulation protocol: Yes   Plan:  Hold warfarin today 1/11 Monitor daily INR, CBC, signs/symptoms of bleeding  Thank you for allowing [place] to participate in this patients care.  [name] Saa, PharmD Clinical Pharmacist  07/03/2017 10:59 AM

## 2017-07-03 NOTE — Progress Notes (Signed)
Asked pt during night if she wanted cpap stated no. 02 100%@ ra no signs distress noted.

## 2017-07-03 NOTE — Progress Notes (Signed)
Initial Nutrition Assessment  DOCUMENTATION CODES:   Obesity unspecified  INTERVENTION:   -Increase Ensure Enlive po to TID, each supplement provides 350 kcal and 20 grams of protein -MVI daily  NUTRITION DIAGNOSIS:   Inadequate oral intake related to poor appetite as evidenced by meal completion < 25%.  GOAL:   Patient will meet greater than or equal to 90% of their needs  MONITOR:   PO intake, Supplement acceptance, Labs, Weight trends, Skin, I & O's  REASON FOR ASSESSMENT:   Consult Assessment of nutrition requirement/status(CHF)  ASSESSMENT:   [name] is a 72 y.o. female with medical history significant of nonischemic cardiomyopathy with EF of 20% as per ECHO 1/5 2019, A. fib on Coumadin, type 2 diabetes mellitus with chronic kidney disease stage IV, anemia of chronic kidney disease, obstructive sleep apnea on CPAP, dyslipidemia and obesity presented to the ED with increasing shortness of breath since discharge from the hospital on June 30, 2017  Pt admitted with COPD exacerbation and CHF.   Spoke with RN prior to visit, who reports poor oral intake and appetite. She also endorses pt with hyperglycemia due to steroids.   Spoke with pt at bedside, who reports ongoing poor appetite over the past several months, which has gotten progressively worse. Pt shares she no longer has the desire to eat; her husband does the cooking at home and although he provides her with her favorite foods, she still eats very little. She reveals the majority of her intake consists of chicken and jello. Pt reports consuming less than 25% of breakfast, consuming only bites and sips of juice, jello, and scrambled eggs. She reports consuming 1-2 Ensure supplements daily PTA.   Reviewed wt hx; noted mild wt gain over the past year, which CHF and edema are likely contributing.   Encouraged to to eat as much of her meals as able. Discussed importance of good meal and supplement intake to promote  healing. Pt amenable to Ensure.   Pt with poor oral intake with fluid restriction and would benefit from nutrient dense supplement. One Ensure Enlive supplement provides 350 kcals, 20 grams protein, and 44-45 grams of carbohydrate vs one Glucerna shake supplement, which provides 220 kcals, 10 grams of protein, and 26 grams of carbohydrate. Given pt's hx of DM, RD will continue to monitor PO intake, CBGS, and adjust supplement regimen as appropriate.   Last Hgb A1c: 6.6 (07/03/17), which inidicates good control. PTA DM medications 2 mg amaryl daily.   Labs reviewed: Na: 134, Mg: 3.3, CBGS: 168-248 (inpatient orders for glycemic control are 0-20 units insulin aspart TID with meals, 0-5 units insulin aspart q HS, 4 units insulin aspart TID with meals).   NUTRITION - FOCUSED PHYSICAL EXAM:    Most Recent Value  Orbital Region  Mild depletion  Upper Arm Region  No depletion  Thoracic and Lumbar Region  No depletion  Buccal Region  No depletion  Temple Region  No depletion  Clavicle Bone Region  No depletion  Clavicle and Acromion Bone Region  No depletion  Scapular Bone Region  No depletion  Dorsal Hand  No depletion  Patellar Region  Mild depletion  Anterior Thigh Region  Mild depletion  Posterior Calf Region  Mild depletion  Edema (RD Assessment)  Mild  Hair  Reviewed  Eyes  Reviewed  Mouth  Reviewed  Skin  Reviewed  Nails  Reviewed       Diet Order:  Diet heart healthy/carb modified Room service appropriate? Yes;  Fluid consistency: Thin; Fluid restriction: 1200 mL Fluid  EDUCATION NEEDS:   Education needs have been addressed  Skin:  Skin Assessment: Skin Integrity Issues: Skin Integrity Issues:: Other (Comment) Other: MASD buttocks  Last BM:  07/02/17  Height:   Ht Readings from Last 1 Encounters:  07/02/17 5\' 5"  (1.651 m)    Weight:   Wt Readings from Last 1 Encounters:  07/03/17 191 lb 5.8 oz (86.8 kg)    Ideal Body Weight:  56.8 kg  BMI:  Body mass index is  31.84 kg/m.  Estimated Nutritional Needs:   Kcal:  1500-1700  Protein:  70-85 grams  Fluid:  1.5-1.7 L    Wendolyn Raso A. Jimmye Carby, RD, LDN, CDE Pager: [phone] After hours Pager: [phone]

## 2017-07-03 NOTE — Care Management Note (Addendum)
Case Management Note  Patient Details  Name: [name] MRN: [id number] Date of Birth: March 07, 1946  Subjective/Objective:     COPD              Action/Plan: 07/03/2017 - Patient recently discharged [facility] home with [facility] services provided by [facility]; CM following for progression of care; awaiting for physical therapy eval for home vs SNF placement. Mindi Slicker RN,MHA,BSN  06/29/2017 - Subjective/Objective:    Adm with CHF/UTI. From home with husband, ind with ADL's. Has Cane and RW pta, uses RW primarily. Has PCP, husband drives her to appointments. Recommended for [facility] PT. Offered choice of [facility]. Patient is concerned about any co-pays. S [place] RN CM   Expected Discharge Date:    possibly 07/06/2017              Expected Discharge Plan:  [place] vs SNF  Discharge planning Services  CM Consult   HH Arranged:  OT, PT [facility] Agency:  [facility] (now [facility])  Status of Service:   In progress  Sherrilyn Rist [phone] 07/03/2017, 2:52 PM

## 2017-07-03 NOTE — Progress Notes (Signed)
Order for Mg supplement implemented.  Am Mg lab result 3.3.  MD on call notified.

## 2017-07-03 NOTE — Evaluation (Signed)
Occupational Therapy Evaluation Patient Details Name: [name] MRN: [id number] DOB: [birth year] Today's Date: 07/03/2017    History of Present Illness [name] is a 72 y.o. female PHMx: nonischemic cardiomyopathy with EF of 20%, A. fib, DM2, CKD stage IV, anemia of CKD, obstructive sleep apnea on CPAP, dyslipidemia and obesity presented to the ED with increasing shortness of breath since discharge from the hospital on June 30, 2017. Found to have COPD exacerbation and possibly worsening congestive heart failure    Clinical Impression   This 72 yo female admitted with above presents to acute OT with decreased endurance and decreased balance thus affecting her safety and independence with basic ADLs. O2 sats on RA ranged from 93-96% with HR 86-94 during session. Pt reports feeling very fatigued. Feel pt would benefit from SNF stay, but pt not open to this so recommend HHOT, HHAide, and 24 hour S/prn A.    Follow Up Recommendations  Home health OT;Supervision/Assistance - 24 hour;Other (comment)(HHAide, feel pt would benefit from SNF, but she was not open to this)    Equipment Recommendations  None recommended by OT       Precautions / Restrictions Precautions Precautions: Fall Restrictions Weight Bearing Restrictions: No      Mobility Bed Mobility Overal bed mobility: Needs Assistance Bed Mobility: Supine to Sit     Supine to sit: Supervision;HOB elevated(and use of rails)        Transfers Overall transfer level: Needs assistance Equipment used: Rolling walker (2 wheeled) Transfers: Sit to/from Stand Sit to Stand: Min assist              Balance Overall balance assessment: Needs assistance Sitting-balance support: No upper extremity supported;Feet supported Sitting balance-Leahy Scale: Good     Standing balance support: Bilateral upper extremity supported;During functional activity Standing balance-Leahy Scale: Poor Standing balance comment: reliant on  RW for stand pivot and in standing                           ADL either performed or assessed with clinical judgement   DL Overall ADL's : Needs assistance/impaired Eating/Feeding: Independent;Sitting   Grooming: Minimal assistance;Sitting   Upper Body Bathing: Minimal assistance;Sitting   Lower Body Bathing: Maximal assistance Lower Body Bathing Details (indicate cue type and reason): min A sit<>stand Upper Body Dressing : Minimal assistance;Sitting   Lower Body Dressing: Total assistance Lower Body Dressing Details (indicate cue type and reason): min A sit<>stand Toilet Transfer: Minimal assistance;Stand-pivot;BSC   Toileting- Clothing Manipulation and Hygiene: Maximal assistance Toileting - Clothing Manipulation Details (indicate cue type and reason): min A sit<>stand             Vision Patient Visual Report: No change from baseline              Pertinent Vitals/Pain Pain Assessment: 0-10 Pain Score: 4  Pain Location: left knee and shoulder (OA) Pain Descriptors / Indicators: Aching;Sore Pain Intervention(s): Limited activity within patient's tolerance;Repositioned     Hand Dominance Right   Extremity/Trunk Assessment Upper Extremity Assessment Upper Extremity Assessment: Overall WFL for tasks assessed              Cognition Arousal/Alertness: Awake/alert Behavior During Therapy: WFL for tasks assessed/performed Overall Cognitive Status: Within Functional Limits for tasks assessed  Home Living Family/patient expects to be discharged to:: Private residence Living Arrangements: Spouse/significant other Available Help at Discharge: Family;Available 24 hours/day Type of Home: House Home Access: Stairs to enter [employer] of Steps: 3 Entrance Stairs-Rails: Left;Right;Can reach both Home Layout: One level     Bathroom Shower/Tub: (sponge baths)   Bathroom  Toilet: Standard     Home Equipment: Environmental consultant - 4 wheels;Cane - single point;Wheelchair - [employer];Shower seat;Electric scooter          Prior Functioning/Environment Level of Independence: Needs assistance  Gait / Transfers Assistance Needed: Continued increased weakness since D/C from hospital on 06/30/17 requiring A of RW and A of husband or daughter ADL's / Homemaking Assistance Needed: Dtr or husband does LB ADLs, pt helps with UB ADLs            OT Problem List: Decreased strength;Decreased range of motion;Impaired balance (sitting and/or standing);Decreased activity tolerance;Obesity;Pain      OT Treatment/Interventions: Self-care/ADL training;Balance training;DME and/or AE instruction;Patient/family education;Therapeutic activities    OT Goals(Current goals can be found in the care plan section) Acute Rehab OT Goals Patient Stated Goal: to go home OT Goal Formulation: With patient Time For Goal Achievement: 07/17/17 Potential to Achieve Goals: Good  OT Frequency: Min 2X/week              AM-PAC PT "6 Clicks" Daily Activity     Outcome Measure Help from another person eating meals?: None Help from another person taking care of personal grooming?: A Little Help from another person toileting, which includes using toliet, bedpan, or urinal?: A Lot Help from another person bathing (including washing, rinsing, drying)?: A Lot Help from another person to put on and taking off regular upper body clothing?: A Little Help from another person to put on and taking off regular lower body clothing?: Total 6 Click Score: 15   End of Session Equipment Utilized During Treatment: Gait belt;Rolling walker Nurse Communication: Mobility status(and pt does not have purewick in in recliner with diaper slightly wet; nurse tech also aware except for diaper slightly wet)  Activity Tolerance: Patient limited by fatigue Patient left: in chair;with call bell/phone within  reach;with chair alarm set  OT Visit Diagnosis: Unsteadiness on feet (R26.81);Pain;Muscle weakness (generalized) (M62.81) Pain - Right/Left: Left Pain - part of body: Shoulder;Knee                Time: 1062-6948 OT Time Calculation (min): 30 min Charges:  OT General Charges $OT Visit: 1 Visit OT Evaluation $OT Eval Moderate Complexity: 1 Mod OT Treatments $Self Care/Home Management : 8-22 mins Golden Circle, OTR/L [phone] 07/03/2017

## 2017-07-04 ENCOUNTER — Encounter (HOSPITAL_COMMUNITY): Payer: Self-pay | Admitting: *Deleted

## 2017-07-04 DIAGNOSIS — E785 Hyperlipidemia, unspecified: Secondary | ICD-10-CM

## 2017-07-04 DIAGNOSIS — Z7901 Long term (current) use of anticoagulants: Secondary | ICD-10-CM

## 2017-07-04 DIAGNOSIS — Z86711 Personal history of pulmonary embolism: Secondary | ICD-10-CM

## 2017-07-04 DIAGNOSIS — I519 Heart disease, unspecified: Secondary | ICD-10-CM

## 2017-07-04 DIAGNOSIS — I482 Chronic atrial fibrillation: Secondary | ICD-10-CM

## 2017-07-04 DIAGNOSIS — R0602 Shortness of breath: Secondary | ICD-10-CM

## 2017-07-04 DIAGNOSIS — J441 Chronic obstructive pulmonary disease with (acute) exacerbation: Secondary | ICD-10-CM

## 2017-07-04 DIAGNOSIS — Z7189 Other specified counseling: Secondary | ICD-10-CM

## 2017-07-04 DIAGNOSIS — Z515 Encounter for palliative care: Secondary | ICD-10-CM

## 2017-07-04 DIAGNOSIS — R531 Weakness: Secondary | ICD-10-CM

## 2017-07-04 DIAGNOSIS — Z9989 Dependence on other enabling machines and devices: Secondary | ICD-10-CM

## 2017-07-04 LAB — BASIC METABOLIC PANEL
ANION GAP: 11 (ref 5–15)
BUN: 128 mg/dL — ABNORMAL HIGH (ref 6–20)
CALCIUM: 9.4 mg/dL (ref 8.9–10.3)
CO2: 19 mmol/L — AB (ref 22–32)
CREATININE: 3.93 mg/dL — AB (ref 0.44–1.00)
Chloride: 99 mmol/L — ABNORMAL LOW (ref 101–111)
GFR calc non Af Amer: 10 mL/min — ABNORMAL LOW (ref >60)
GFR, EST AFRICAN AMERICAN: 12 mL/min — AB (ref >60)
Glucose, Bld: 208 mg/dL — ABNORMAL HIGH (ref 65–99)
Potassium: 4.7 mmol/L (ref 3.5–5.1)
SODIUM: 129 mmol/L — AB (ref 135–145)

## 2017-07-04 LAB — PROTIME-INR
INR: 3.1
PROTHROMBIN TIME: 31.7 s — AB (ref 11.4–15.2)

## 2017-07-04 LAB — GLUCOSE, CAPILLARY
GLUCOSE-CAPILLARY: 220 mg/dL — AB (ref 65–99)
Glucose-Capillary: 312 mg/dL — ABNORMAL HIGH (ref 65–99)
Glucose-Capillary: 322 mg/dL — ABNORMAL HIGH (ref 65–99)
Glucose-Capillary: 286 mg/dL — ABNORMAL HIGH (ref 65–99)

## 2017-07-04 MED ORDER — WARFARIN - PHARMACIST DOSING INPATIENT: Freq: Every day | Status: DC

## 2017-07-04 MED ORDER — INSULIN ASPART 100 UNIT/ML ~~LOC~~ SOLN
6.0000 [IU] | Freq: Three times a day (TID) | SUBCUTANEOUS | Status: DC
  Administered 2017-07-04 – 2017-07-06 (×5): 6 [IU] via SUBCUTANEOUS

## 2017-07-04 MED ORDER — WARFARIN 0.5 MG HALF TABLET
0.5000 mg | ORAL_TABLET | Freq: Once | ORAL | Status: AC
  Administered 2017-07-04: 0.5 mg via ORAL
  Filled 2017-07-04: qty 1

## 2017-07-04 NOTE — Consult Note (Signed)
Consultation Note Date: 07/04/2017   Patient Name: [name]  DOB: 23-Apr-1946  MRN: [id number]  Age / Sex: 72 y.o., female  PCP: Lemmie Evens, MD Referring Physician: Alma Friendly, MD  Reason for Consultation: Establishing goals of care  HPI/Patient Profile: 72 y.o. female   admitted on 07/02/2017   Clinical Assessment and Goals of Care:  72 year old lady who lives at home with her husband in [place], [place]. Patient was recently hospitalized at [facility], was discharged home, has been admitted here with congestive heart failure, possible urinary tract infection.  Patient has nonischemic cardiomyopathy ejection fraction of 20% based on echocardiogram obtained on 06-27-17. She has a history of having an ICD placed, however that was discontinued according to chart review, this is what the husband states as well, the ICD got infected and the patient was in the hospital for 2 months on PICC line and IV antibiotics and the ICD had to be extracted due to bacteremia and pacemaker lead vegetation. The patient according to chart review last saw Dr. Aundra Dubin from cardiology in October 2017 at the CHF clinic.  The patient continued to have gradually worsening shortness of breath since discharge from [facility] last week. The patient is admitted to hospital medicine service since 07-02-17 for COPD exacerbation, acute on chronic systolic heart failure exacerbation, New York Heart Association class IV, acute on chronic kidney disease stage IV with baseline creatinine around 3.5, and this hospitalization elevated to 4, also has chronic A. fib for which she is on chronic oral anticoagulation with Coumadin, also has underlying history of diabetes.  A palliative medicine consultation has been requested for goals of care discussions.  Patient is resting in bed. Her husband Fritz Pickerel is present at  the bedside. She is currently getting a breathing treatment. I introduced myself and palliative care as follows: Palliative medicine is specialized medical care for people living with serious illness. It focuses on providing relief from the symptoms and stress of a serious illness. The goal is to improve quality of life for both the patient and the family.  Patient appears weak, chronically ill. She has generalized weakness. Discussed with patient and her husband about multiple serious illnesses with ongoing progressive decline such as her heart failure, and her chronic kidney disease. Goals wishes and values attempted to be discussed. See discussions/recommendations below. Thank you for the consult.  NEXT OF KIN  husband Fritz Pickerel, they live in [place], [place].  Also has a son, has a daughter who lives with them part-time in [place], [place].  Discussions and family meeting/SUMMARY OF RECOMMENDATIONS    1. Goals of care are full code, full scope.  2. Patient with CKD, saw Dr Trula Slade and had fistula placed, husband asking for nephrology follow up and he wants to know when dialysis needs to be initiated.  3. Husband also asking about cardiology input, patient sees Dr Aundra Dubin in the out patient setting, has an appointment with him scheduled for the 16th of January 2019.  4. Recommend  SNF rehab with palliative services following, PT repeat eval pending, await their recommendations, how ever, patient wishes to be at home with husband.  5. Gentle discussions about disease trajectory and burden of illness from multiple organ systems being affected such as her CHF, CKD etc discussed in detail. Patient wishes for full code, full scope, wants to focus on getting better.  Thank you for the consult.  Code Status/Advance Care Planning:  Full code    Symptom Management:    as above   Palliative Prophylaxis:   Bowel Regimen  Additional Recommendations (Limitations, Scope, Preferences):  Full  Scope Treatment  Psycho-social/Spiritual:   Desire for further Chaplaincy support:no  Additional Recommendations: Caregiving  Support/Resources  Prognosis:   Guarded, given multiple serious illnesses.   Discharge Planning: recommend skilled Nursing Facility for rehab with Palliative care service follow-up, but patient wants to go home.      Primary Diagnoses: Present on Admission: . Acute on chronic systolic CHF (congestive heart failure), NYHA class 4 ([place]) . Dyslipidemia . HTN (hypertension) . History of pulmonary embolism . Type 2 diabetes, uncontrolled, with renal manifestation ([place]) . Chronic renal disease, stage 4, severely decreased glomerular filtration rate (GFR) between 15-29 mL/min/1.73 square meter (HCC) . Atrial fibrillation ([place]) . Implantable cardioverter-defibrillator-CRT- Mdt . COPD (chronic obstructive pulmonary disease) ([place]) . Hypothyroidism . Anemia in chronic renal disease . SOB (shortness of breath)   I have reviewed the medical record, interviewed the patient and family, and examined the patient. The following aspects are pertinent.  Past Medical History:  Diagnosis Date  . Anemia    a. mild/chronic  . Anemia due to GI blood loss 02/26/2016  . Anemia in chronic renal disease 11/30/2014  . Cardiomyopathy, nonischemic ([place])    a. 1999 nl cath;  b. 12/05 [facility];  c. 10/2005 ICD extraction 2/2 enterococcus bacteremia and Veg on RV lead;  c. 05/2008 low risk Myoview (scarring w/ some evidence of inf ischemia);  d. 11/2012 Echo: EF 15-20%;  e. 01/2013 s/p MDT [place] CRT D, ser # [id number];  f. 05/2013 Echo: EF 15%.  . Chronic systolic CHF (congestive heart failure) ([place])    a. 11/2012 Echo: EF 15-20%;  b. 05/2013 TEE EF 15%.  . CKD (chronic kidney disease), stage III ([place])    creatinin-1.44 in 1/09; 1.51 in 1/10  . Degenerative joint disease    of knees, shoulder, and hips  . Enterococcal infection    a. 10/2005 - AICD-explanted  . GERD  (gastroesophageal reflux disease)   . Hilar density    a. infrahilar mass/adenopathy on CT scan 5/07; subsequently  resolved  . History of blood transfusion   . Hyperlipidemia   . Hypertension   . Hypothyroidism   . ICD (implantable cardioverter-defibrillator) infection ([place])    removed 2016  . Implantable cardioverter-defibrillator-CRT- Mdt    a.  01/2013 s/p MDT [place] CRT D, ser # [id number] - ICD has been removed  . LBBB (left bundle branch block)   . Obstructive sleep apnea    uses cpap  . PAF (paroxysmal atrial fibrillation) ([place])    a. 07/2012 s/p TEE/DCCV;  b. chronic coumadin;  c. 05/2013 Recurrent Afib->TEE/DCCV and amio initiation.  . Peripheral vascular disease ([place])   . Pneumonia 07/2005  . Pulmonary embolism ([place])    a. 07/2005 after total right hip arthroplasty  . Tobacco abuse    a. discontinued in 1997, and then resumed  . Type II diabetes mellitus ([place])    type  2  . Urinary incontinence   . Villous adenoma of colon    a. tubovillous adenomatous polyp with focal high grade dysplasia; presented with hematochezia - followed by Dr. Laural Golden.   Social History   Socioeconomic History  . Marital status: Married    Spouse name: None  . Number of children: None  . Years of education: None  . Highest education level: None  Social Needs  . Financial resource strain: None  . Food insecurity - worry: None  . Food insecurity - inability: None  . Transportation needs - medical: None  . Transportation needs - non-medical: None  Occupational History  . None  Tobacco Use  . Smoking status: Former Smoker    Years: 12.00    Types: Cigarettes  . Smokeless tobacco: Never Used  . Tobacco comment: 05/2013: Smokes an occasional cigarette.  Says that she doesn't inhale.  Substance and Sexual Activity  . Alcohol use: No    Alcohol/week: 0.0 oz  . Drug use: No  . Sexual activity: Not Currently    Birth control/protection: None  Other Topics Concern  . None  Social History  Narrative   Married, lives in [place] with spouse. Retired Secretary/administrator.    Family History  Problem Relation Age of Onset  . Hypertension Mother   . Diabetes Mother   . Coronary artery disease Father   . Diabetes Brother   . Hypertension Brother   . Lung cancer Brother   . Arthritis Other   . Diabetes Other   . Heart disease Other        female < 55   Scheduled Meds: . amiodarone  100 mg Oral Daily  . aspirin EC  81 mg Oral Daily  . calcitRIOL  0.25 mcg Oral Daily  . carvedilol  6.25 mg Oral BID WC  . feeding supplement (ENSURE ENLIVE)  237 mL Oral TID BM  . ferrous sulfate  325 mg Oral BID  . guaiFENesin  600 mg Oral BID  . insulin aspart  0-20 Units Subcutaneous TID WC  . insulin aspart  0-5 Units Subcutaneous QHS  . insulin aspart  4 Units Subcutaneous TID WC  . insulin glargine  14 Units Subcutaneous Daily  . ipratropium-albuterol  3 mL Nebulization BID  . isosorbide mononitrate  30 mg Oral Daily  . levothyroxine  50 mcg Oral QAC breakfast  . methylPREDNISolone (SOLU-MEDROL) injection  40 mg Intravenous Q6H  . multivitamin with minerals  1 tablet Oral Daily  . polyethylene glycol  17 g Oral Daily  . pravastatin  80 mg Oral QHS  . sodium chloride flush  3 mL Intravenous Q12H   Continuous Infusions: . sodium chloride     PRN Meds:.sodium chloride, acetaminophen **OR** acetaminophen, albuterol, ondansetron **OR** ondansetron (ZOFRAN) IV, oxyCODONE-acetaminophen **AND** oxyCODONE, senna-docusate, sodium chloride flush Medications Prior to Admission:  Prior to Admission medications   Medication Sig Start Date End Date Taking? Authorizing Provider  amiodarone (PACERONE) 200 MG tablet Take 100 mg by mouth daily.  02/25/17  Yes [provider]  calcitRIOL (ROCALTROL) 0.25 MCG capsule Take 0.25 mcg by mouth daily. 06/09/16  Yes [provider]  carvedilol (COREG) 6.25 MG tablet TAKE 1 TABLET BY MOUTH TWICE DAILY WITH A MEAL. 06/13/16  Yes Larey Dresser, MD    colchicine 0.6 MG tablet Take 0.6 mg by mouth 2 (two) times daily as needed (gout). Reported on 11/06/2015 12/20/13  Yes [provider]  feeding supplement, ENSURE ENLIVE, (ENSURE ENLIVE) LIQD Take  237 mLs by mouth 2 (two) times daily between meals. Patient taking differently: Take 237 mLs by mouth daily with lunch.  10/26/14  Yes Riccardo Dubin, MD  ferrous sulfate 324 (65 FE) MG TBEC Take 1 tablet (325 mg total) by mouth 2 (two) times daily. 12/02/14  Yes Tat, Shanon Brow, MD  furosemide (LASIX) 40 MG tablet TAKE 1 TABLET BY MOUTH TWICE DAILY. 05/27/16  Yes Larey Dresser, MD  glimepiride (AMARYL) 2 MG tablet Take 2 mg by mouth daily with breakfast.  12/05/14  Yes [provider]  guaiFENesin (MUCINEX) 600 MG 12 hr tablet Take 1 tablet (600 mg total) by mouth 2 (two) times daily. 06/30/17  Yes Dhungel, Nishant, MD  guaiFENesin-dextromethorphan (ROBITUSSIN DM) 100-10 MG/5ML syrup Take 5 mLs by mouth every 4 (four) hours as needed for cough. 06/30/17  Yes Dhungel, Nishant, MD  isosorbide mononitrate (IMDUR) 30 MG 24 hr tablet Take 1 tablet (30 mg total) by mouth daily. 10/26/14  Yes Riccardo Dubin, MD  levothyroxine (SYNTHROID, LEVOTHROID) 50 MCG tablet Take 50 mcg by mouth daily before breakfast. 02/25/17  Yes [provider]  lisinopril (PRINIVIL,ZESTRIL) 10 MG tablet Take 1 tablet (10 mg total) by mouth daily. Patient taking differently: Take 5 mg by mouth daily.  06/30/17  Yes Dhungel, Nishant, MD  metoCLOPramide (REGLAN) 5 MG tablet Take 5 mg by mouth 4 (four) times daily.   Yes [provider]  ondansetron (ZOFRAN) 4 MG tablet Take 4 mg by mouth every 4 (four) hours as needed for nausea or vomiting. Reported on 11/06/2015   Yes [provider]  oxyCODONE-acetaminophen (PERCOCET) 10-325 MG per tablet Take 1 tablet by mouth every 6 (six) hours as needed for pain.  12/09/13  Yes [provider]  polyethylene glycol powder (GLYCOLAX/MIRALAX) powder Take 17 g by  mouth daily. 06/30/17  Yes Dhungel, Nishant, MD  pravastatin (PRAVACHOL) 40 MG tablet Take 80 mg by mouth at bedtime.    Yes [provider]  senna-docusate (SENOKOT-S) 8.6-50 MG tablet Take 2 tablets by mouth daily as needed for mild constipation. 06/30/17 06/30/18 Yes Dhungel, Nishant, MD  spironolactone (ALDACTONE) 25 MG tablet TAKE 1/2 TABLET BY MOUTH DAILY. 03/18/16  Yes Bensimhon, Shaune Pascal, MD  warfarin (COUMADIN) 2.5 MG tablet Take 0.5-1 tablets (1.25-2.5 mg total) by mouth daily at 6 PM. Takes 1 tablet on Mon, Wed, Fri, take 0.5 tablet on all other days Patient taking differently: Take 1.25-2.5 mg by mouth daily at 6 PM. Take 0.5 tablet (1.25 mg) by mouth on Monday, Wednesday, & Friday then Take 1 tablet (2.5 mg) on all other days Sunday, Tuesday, Thursday, & Saturday 01/10/15  Yes Samuella Cota, MD   No Known Allergies Review of Systems +weakness  Physical Exam Awake alert Irregular Appears weak Appears chronically ill Abdomen is soft, mildly distended Regular effort at breathing Generalized weakness evident Has fistula on R arm.   Vital Signs: BP 102/68 (BP Location: Left Arm)   Pulse (!) 104   Temp (!) 97.5 F (36.4 C) (Oral)   Resp 18   Ht 5\' 5"  (1.651 m)   Wt 84.8 kg (186 lb 14.4 oz) Comment: c scale  SpO2 99%   BMI 31.10 kg/m  Pain Assessment: No/denies pain   Pain Score: 8    SpO2: SpO2: 99 % O2 Device:SpO2: 99 % O2 Flow Rate: .   IO: Intake/output summary:   Intake/Output Summary (Last 24 hours) at 07/04/2017 1038 Last data filed at 07/04/2017 0900  Gross per 24 hour  Intake 717 ml  Output 1200 ml  Net -483 ml    LBM: Last BM Date: 07/03/17 Baseline Weight: Weight: 85.3 kg (188 lb) Most recent weight: Weight: 84.8 kg (186 lb 14.4 oz)(c scale)     Palliative Assessment/Data:   PPS 30%  Time In:  9.30 Time Out:  10.40 Time Total:  70 min  Greater than 50%  of this time was spent counseling and coordinating care related to the above  assessment and plan.  Signed by: Loistine Chance, MD  [phone]  Please contact Palliative Medicine Team phone at [phone] for questions and concerns.  For individual provider: See Shea Evans

## 2017-07-04 NOTE — Consult Note (Signed)
Renal Service Consult Note [facility]  [name] 07/04/2017 Sol Blazing Requesting Physician:  Dr Horris Latino  Reason for Consult:  CKD stage 4/5  HPI: The patient is a 72 y.o. year-old with hx of PAF, OSA, DM2, NICM (sp CRT-D x 2, explanted due to infection) and CKD stage 4/5.  Patient was admitted on 1/10 for SOB and wheezing.  SOB felt to be due to COPD exac, no fluid on CXR.  Fatigue as well, felt to be due to CHF, COPD and /or uremia from CKD.  Asked to see for CKD.    Pt w significant fatigue compared to 1 months ago per family. +poor appetite, low energy, low activity.  +nausea.  No CP, no abd pain , or diarrhea.   Old chart: Jan 4- 8, 2019 > UTI, acute/ chron syst CHF, rx with IV lasix and abx. EF 25%. EColi +UCx.  acei and aldactone held due to [place] from base[place]ne. Recent avg placement.   ROS  denies CP  no joint pain   no HA  no blurry vision  Past Medical History  Past Medical History:  Diagnosis Date  . Anemia    a. mild/chronic  . Anemia due to GI blood loss 02/26/2016  . Anemia in chronic renal disease 11/30/2014  . Cardiomyopathy, nonischemic ([place])    a. 1999 nl cath;  b. 12/05 Guidant Ecru;  c. 10/2005 ICD extraction 2/2 enterococcus bacteremia and Veg on RV lead;  c. 05/2008 low risk Myoview (scarring w/ some evidence of inf ischemia);  d. 11/2012 Echo: EF 15-20%;  e. 01/2013 s/p MDT [place] CRT D, ser # [id number];  f. 05/2013 Echo: EF 15%.  . Chronic systolic CHF (congestive heart failure) ([place])    a. 11/2012 Echo: EF 15-20%;  b. 05/2013 TEE EF 15%.  . CKD (chronic kidney disease), stage III ([place])    creatinin-1.44 in 1/09; 1.51 in 1/10  . Degenerative joint disease    of knees, shoulder, and hips  . Enterococcal infection    a. 10/2005 - AICD-explanted  . GERD (gastroesophageal reflux disease)   . Hilar density    a. infrahilar mass/adenopathy on CT scan 5/07; subsequently  resolved  . History of blood transfusion   . Hyperlipidemia   .  Hypertension   . Hypothyroidism   . ICD (implantable cardioverter-defibrillator) infection ([place])    removed 2016  . Implantable cardioverter-defibrillator-CRT- Mdt    a.  01/2013 s/p MDT [place] CRT D, ser # [id number] - ICD has been removed  . LBBB (left bundle branch block)   . Obstructive sleep apnea    uses cpap  . PAF (paroxysmal atrial fibrillation) ([place])    a. 07/2012 s/p TEE/DCCV;  b. chronic coumadin;  c. 05/2013 Recurrent Afib->TEE/DCCV and amio initiation.  . Peripheral vascular disease ([place])   . Pneumonia 07/2005  . Pulmonary embolism ([place])    a. 07/2005 after total right hip arthroplasty  . Tobacco abuse    a. discontinued in 1997, and then resumed  . Type II diabetes mellitus ([place])    type 2  . Urinary incontinence   . Villous adenoma of colon    a. tubovillous adenomatous polyp with focal high grade dysplasia; presented with hematochezia - followed by Dr. Laural Golden.   Past Surgical History  Past Surgical History:  Procedure Laterality Date  . A-V CARDIAC PACEMAKER INSERTION  12/05   Biventricular pacemaker/AICD  . ABDOMINAL HYSTERECTOMY  1990/92   Initial partial hysterectomy followed  by BSO  . BASCILIC VEIN TRANSPOSITION Right 03/05/2017   Procedure: RIGHT 1ST STAGE BASCILIC VEIN TRANSPOSITION;  Surgeon: Serafina Mitchell, MD;  Location: [place];  Service: Vascular;  Laterality: Right;  . BASCILIC VEIN TRANSPOSITION Right 05/28/2017   Procedure: BASILIC VEIN TRANSPOSITION SECOND STAGE RIGHT;  Surgeon: Serafina Mitchell, MD;  [facility];  Service: Vascular;  Laterality: Right;  . BI-VENTRICULAR IMPLANTABLE CARDIOVERTER DEFIBRILLATOR N/A 01/24/2013   Procedure: BI-VENTRICULAR IMPLANTABLE CARDIOVERTER DEFIBRILLATOR  (CRT-D);  Surgeon: Evans Lance, MD;  Location: [facility] CATH LAB;  Service: Cardiovascular;  Laterality: N/A;  . BI-VENTRICULAR IMPLANTABLE CARDIOVERTER DEFIBRILLATOR  (CRT-D)  01/24/2013  . CARDIAC CATHETERIZATION    . CARDIOVERSION N/A 08/20/2012   Procedure: TEE  GUIDED CARDIOVERSION;  Surgeon: Yehuda Savannah, MD;  Location: [facility];  Service: Cardiovascular;  Laterality: N/A;  To be done @ bedside  . CARDIOVERSION N/A 06/20/2013   Procedure: CARDIOVERSION;  Surgeon: Dorothy Spark, MD;  Location: [place];  Service: Cardiovascular;  Laterality: N/A;  . CATARACT EXTRACTION W/PHACO Left 08/07/2015   Procedure: CATARACT EXTRACTION PHACO AND INTRAOCULAR LENS PLACEMENT ([place]);  Surgeon: Rutherford Guys, MD;  Location: [facility];  Service: Ophthalmology;  Laterality: Left;  CDE: 7.88  . CATARACT EXTRACTION W/PHACO Right 08/21/2015   Procedure: CATARACT EXTRACTION PHACO AND INTRAOCULAR LENS PLACEMENT (IOC);  Surgeon: Rutherford Guys, MD;  Location: [facility];  Service: Ophthalmology;  Laterality: Right;  CDE:8.55  . COLONOSCOPY Left 10/24/2014   Procedure: COLONOSCOPY;  Surgeon: Carol Ada, MD;  Location: [facility] ENDOSCOPY;  Service: Endoscopy;  Laterality: Left;  . COLONOSCOPY W/ POLYPECTOMY  2009  . COLONOSCOPY WITH ESOPHAGOGASTRODUODENOSCOPY (EGD) N/A 06/10/2013   Procedure: COLONOSCOPY WITH ESOPHAGOGASTRODUODENOSCOPY (EGD);  Surgeon: Rogene Houston, MD;  [facility];  Service: Endoscopy;  Laterality: N/A;  925  . ESOPHAGOGASTRODUODENOSCOPY N/A 10/21/2014   Procedure: ESOPHAGOGASTRODUODENOSCOPY (EGD);  Surgeon: Inda Castle, MD;  Location: [place];  Service: Endoscopy;  Laterality: N/A;  . GIVENS CAPSULE STUDY N/A 10/24/2014   Procedure: GIVENS CAPSULE STUDY;  Surgeon: Carol Ada, MD;  Location: [place];  Service: Endoscopy;  Laterality: N/A;  . ICD LEAD REMOVAL N/A 12/13/2014   Procedure: ICD LEAD REMOVAL/EXTRACTION ;  Surgeon: Evans Lance, MD;  Location: [place];  Service: Cardiovascular;  Laterality: N/A;  [place] back up  . KNEE ARTHROSCOPY Right 1980's?  Marland Kitchen PACEMAKER REMOVAL  11/18/05   Enterococcal infection  . RIGHT HEART CATHETERIZATION N/A 04/18/2014   Procedure: RIGHT HEART CATH;  Surgeon: Larey Dresser, MD;  Location: [facility] CATH LAB;   Service: Cardiovascular;  Laterality: N/A;  . TEE WITHOUT CARDIOVERSION N/A 08/20/2012   Procedure: TRANSESOPHAGEAL ECHOCARDIOGRAM (TEE);  Surgeon: Yehuda Savannah, MD;  [facility];  Service: Cardiovascular;  Laterality: N/A;  . TEE WITHOUT CARDIOVERSION N/A 06/20/2013   Procedure: TRANSESOPHAGEAL ECHOCARDIOGRAM (TEE);  Surgeon: Dorothy Spark, MD;  Location: [place];  Service: Cardiovascular;  Laterality: N/A;  . TEE WITHOUT CARDIOVERSION N/A 12/08/2014   Procedure: TRANSESOPHAGEAL ECHOCARDIOGRAM (TEE);  Surgeon: Fay Records, MD;  [facility];  Service: Cardiovascular;  Laterality: N/A;  . TOTAL HIP ARTHROPLASTY Right 07/2005  . TUBAL LIGATION  1980's   Family History  Family History  Problem Relation Age of Onset  . Hypertension Mother   . Diabetes Mother   . Coronary artery disease Father   . Diabetes Brother   . Hypertension Brother   . Lung cancer Brother   . Arthritis Other   .  Diabetes Other   . Heart disease Other        female < 83   Social History  reports that she has quit smoking. Her smoking use included cigarettes. She quit after 12.00 years of use. she has never used smokeless tobacco. She reports that she does not drink alcohol or use drugs. Allergies No Known Allergies Home medications Prior to Admission medications   Medication Sig Start Date End Date Taking? Authorizing Provider  amiodarone (PACERONE) 200 MG tablet Take 100 mg by mouth daily.  02/25/17  Yes [provider]  calcitRIOL (ROCALTROL) 0.25 MCG capsule Take 0.25 mcg by mouth daily. 06/09/16  Yes [provider]  carvedilol (COREG) 6.25 MG tablet TAKE 1 TABLET BY MOUTH TWICE DAILY WITH A MEAL. 06/13/16  Yes Larey Dresser, MD  colchicine 0.6 MG tablet Take 0.6 mg by mouth 2 (two) times daily as needed (gout). Reported on 11/06/2015 12/20/13  Yes [provider]  feeding supplement, ENSURE ENLIVE, (ENSURE ENLIVE) LIQD Take 237 mLs by mouth 2 (two) times  daily between meals. Patient taking differently: Take 237 mLs by mouth daily with lunch.  10/26/14  Yes Riccardo Dubin, MD  ferrous sulfate 324 (65 FE) MG TBEC Take 1 tablet (325 mg total) by mouth 2 (two) times daily. 12/02/14  Yes Tat, Shanon Brow, MD  furosemide (LASIX) 40 MG tablet TAKE 1 TABLET BY MOUTH TWICE DAILY. 05/27/16  Yes Larey Dresser, MD  glimepiride (AMARYL) 2 MG tablet Take 2 mg by mouth daily with breakfast.  12/05/14  Yes [provider]  guaiFENesin (MUCINEX) 600 MG 12 hr tablet Take 1 tablet (600 mg total) by mouth 2 (two) times daily. 06/30/17  Yes Dhungel, Nishant, MD  guaiFENesin-dextromethorphan (ROBITUSSIN DM) 100-10 MG/5ML syrup Take 5 mLs by mouth every 4 (four) hours as needed for cough. 06/30/17  Yes Dhungel, Nishant, MD  isosorbide mononitrate (IMDUR) 30 MG 24 hr tablet Take 1 tablet (30 mg total) by mouth daily. 10/26/14  Yes Riccardo Dubin, MD  levothyroxine (SYNTHROID, LEVOTHROID) 50 MCG tablet Take 50 mcg by mouth daily before breakfast. 02/25/17  Yes [provider]  lisinopril (PRINIVIL,ZESTRIL) 10 MG tablet Take 1 tablet (10 mg total) by mouth daily. Patient taking differently: Take 5 mg by mouth daily.  06/30/17  Yes Dhungel, Nishant, MD  metoCLOPramide (REGLAN) 5 MG tablet Take 5 mg by mouth 4 (four) times daily.   Yes [provider]  ondansetron (ZOFRAN) 4 MG tablet Take 4 mg by mouth every 4 (four) hours as needed for nausea or vomiting. Reported on 11/06/2015   Yes [provider]  oxyCODONE-acetaminophen (PERCOCET) 10-325 MG per tablet Take 1 tablet by mouth every 6 (six) hours as needed for pain.  12/09/13  Yes [provider]  polyethylene glycol powder (GLYCOLAX/MIRALAX) powder Take 17 g by mouth daily. 06/30/17  Yes Dhungel, Nishant, MD  pravastatin (PRAVACHOL) 40 MG tablet Take 80 mg by mouth at bedtime.    Yes [provider]  senna-docusate (SENOKOT-S) 8.6-50 MG tablet Take 2 tablets by mouth daily as needed for  mild constipation. 06/30/17 06/30/18 Yes Dhungel, Nishant, MD  spironolactone (ALDACTONE) 25 MG tablet TAKE 1/2 TABLET BY MOUTH DAILY. 03/18/16  Yes Bensimhon, Shaune Pascal, MD  warfarin (COUMADIN) 2.5 MG tablet Take 0.5-1 tablets (1.25-2.5 mg total) by mouth daily at 6 PM. Takes 1 tablet on Mon, Wed, Fri, take 0.5 tablet on all other days Patient taking differently: Take 1.25-2.5 mg by mouth daily  at 6 PM. Take 0.5 tablet (1.25 mg) by mouth on Monday, Wednesday, & Friday then Take 1 tablet (2.5 mg) on all other days Sunday, Tuesday, Thursday, & Saturday 01/10/15  Yes Samuella Cota, MD   Liver Function Tests Recent Labs  Lab 07/02/17 1131  AST 36  ALT 160*  ALKPHOS 52  BILITOT 1.1  PROT 6.8  ALBUMIN 3.6   No results for input(s): LIPASE, AMYLASE in the last 168 hours. CBC Recent Labs  Lab 06/30/17 0505 07/02/17 1131 07/03/17 0501  WBC 5.5 7.4 4.3  NEUTROABS  --  5.8  --   HGB 10.8* 11.1* 10.6*  HCT 34.1* 35.5* 33.9*  MCV 99.4 98.6 97.4  PLT 206 242 606   Basic Metabolic Panel Recent Labs  Lab 06/28/17 0611 06/29/17 0536 06/30/17 0505 07/02/17 1131 07/03/17 0501 07/04/17 0349  NA 139 136 135 135 134* 129*  K 4.4 4.0 4.0 4.4 4.5 4.7  CL 105 101 100* 99* 99* 99*  CO2 21* 22 21* 17* 19* 19*  GLUCOSE 119* 156* 135* 189* 241* 208*  BUN 93* 95* 94* 109* 115* 128*  CREATININE 3.51* 3.57* 3.42* 4.11* 4.14* 3.93*  CALCIUM 9.5 9.0 9.0 9.5 9.3 9.4   Iron/TIBC/Ferritin/ %Sat    Component Value Date/Time   IRON 84 02/06/2017 0837   TIBC 244 (L) 02/06/2017 0837   FERRITIN 230 02/06/2017 0837   IRONPCTSAT 34 (H) 02/06/2017 0837   IRONPCTSAT 17 (L) 07/22/2011 1011    Vitals:   07/03/17 2109 07/03/17 2155 07/04/17 0535 07/04/17 1020  BP: 94/65  102/68   Pulse: 66  (!) 104   Resp: 17  18   Temp: 98.4 F (36.9 C)  (!) 97.5 F (36.4 C)   TempSrc: Oral  Oral   SpO2: 98% 97% 98% 99%  Weight:   84.8 kg (186 lb 14.4 oz)   Height:       Exam Gen alert, no distress, elderly  WF no distress No rash, cyanosis or gangrene Sclera anicteric, throat clear   No jvd or bruits Chest clear bilat RRR +summation gallop, 2/6 SEM Abd soft ntnd no mass or ascites +bs obese GU defer MS no joint effusions or deformity Ext diffuse 2+ LE edema , 1+ around the hips / no wounds or ulcers Neuro is alert, Ox 3, nonfocal , very mild asterixis RUA AVF +bruit, looks well developed   Inpt meds > amiodarone/ asa/ rocaltrol/ coreg 6.25 bid/ fe sulfate/ novolog/ lantus/ duoneb/ imdur/ mvi/ solumedrol IV/ statin/ coumadin   Na 129  K 4.7  BUN 128  Cr 3.93  CO2 19   Hb 10.6  plt 207 INR 3.10    BNP 2179  1st stage BVT - 03/05/17 (Dr Trula Slade) 2nd stage BVT - 05/28/17 (Dr Trula Slade)  Date  Cr   eGFR 2016  1.6- 2.5 18- 32 2017  2.5- 2.9 15- 07 Feb 2017 2.95 Jun 26 2017 3.01  15  Jun 30 2017 3.42  13 Jul 04 2017 3.93  12  CXR 1/101/19 > no active disease  Impression: 1. CKD stage IV w AKI: vs CKD stage V.  Vol overload but not severe.  Fatigue and mobility worse over last month.  Suspect pt may be uremic. Mild asterixis. Considering initiation of dialysis, will ask vasc surg to inspect the AVF for ability to use.   2. Severe NICM - hx BiV pacer x 2 removed d/t infection.  3. Vol overload - cxr clear, LE edema 4. COPD  exacerbation - improved, on IV steroids 5. DM2  6. Chronic afib - on amio, coumadin and low dose coreg 7. Anemia of CKD - Hb ok 10.6 8. Hypotension - BP's borderline, 90's - low 100's     Plan - as above  Kelly Splinter MD [employer] pager [phone]   07/04/2017, 1:16 PM

## 2017-07-04 NOTE — Progress Notes (Signed)
Inpatient Diabetes Program Recommendations  AACE/ADA: New Consensus Statement on Inpatient Glycemic Control (2015)  Target Ranges:  Prepandial:   less than 140 mg/dL      Peak postprandial:   less than 180 mg/dL (1-2 hours)      Critically ill patients:  140 - 180 mg/dL   Lab Results  Component Value Date   GLUCAP 312 (H) 07/04/2017   HGBA1C 6.6 (H) 07/03/2017    Review of Glycemic Control  Post-prandials elevated. Needs increase in meal coverage insulin.  Inpatient Diabetes Program Recommendations:    Increase Novolog to 6 units tidwc for meal coverage insulin.  Continue to follow.  Thank you. Lorenda Peck, RD, LDN, CDE Inpatient Diabetes Coordinator [phone]

## 2017-07-04 NOTE — Consult Note (Signed)
Cardiology Consultation:   Patient ID: [name]; [id number]; 01/12/1946   Admit date: 07/02/2017 Date of Consult: 07/04/2017  Primary Care Provider: Lemmie Evens, MD Primary Cardiologist: Dr. Marigene Ehlers  Patient Profile:   [name] is a 72 y.o. female with a hx of  who is being seen today for the evaluation of shortness of breath and cardiomyopathy at the request of Dr. Horris Latino.  History of Present Illness:   Ms. Hillesheim is a 72 year old woman with a history of chronic systolic heart failure from nonischemic cardiomyopathy, prior pulmonary embolism, chronic kidney disease, and paroxysmal atrial fibrillation.  She was last evaluated in the advanced heart failure clinic in October 2017.  Coronary angiography in 1999 demonstrated no significant disease.  She underwent cardiac resynchronization therapy with a defibrillator in 2005.  It was removed in 2007 due to enterococcal bacteremia with vegetations.  She underwent subsequent device placement August 2014.  She was then hospitalized in June 2016 with enterococcal bacteremia and was found to have pacemaker lead vegetation and the device was again extracted.  She was recently hospitalized in ultimately discharged on January 8 for acute on chronic combined systolic and diastolic heart failure.  She also had a urinary tract infection.  Echocardiogram demonstrated severely reduced left ventricular systolic function, LVEF 37-62%.  She recently had an AV graft placed by Dr. Trula Slade.  Cardiac medications at the time of discharge included amiodarone, carvedilol, Lasix 40 mg twice daily, indoor 30 mg, lisinopril 10 mg, spironolactone 12.5 mg daily, and warfarin.  She then presented to the emergency department at [facility] with complaints of shortness of breath and weakness since being discharged.  She was given a nebulizer treatment and steroids in the ED and appeared to have some improvement.  BNP on admission was 1605.  It had been in the  2200 range over a week ago.  Chest x-ray showed stable cardiomegaly and bibasilar scarring with no active cardia pulmonary disease.  BUN found to be 128 with creatinine 3.93.  Lasix has been held.  BUN had been 94 with a creatinine of 3.4-4 days ago.  ECG which I personally interpreted performed on 07/02/17 demonstrated atrial fibrillation with a chronic left bundle branch block.  She was evaluated by nephrology today.  They deemed her to be volume overloaded but not severely so.  Felt her fatigue may be related to uremia.  They are considering initiation of dialysis have asked vascular surgery to assess her for ability to use AV fistula.   Past Medical History:  Diagnosis Date  . Anemia    a. mild/chronic  . Anemia due to GI blood loss 02/26/2016  . Anemia in chronic renal disease 11/30/2014  . Cardiomyopathy, nonischemic ([place])    a. 1999 nl cath;  b. 12/05 Guidant [place];  c. 10/2005 ICD extraction 2/2 enterococcus bacteremia and Veg on RV lead;  c. 05/2008 low risk Myoview (scarring w/ some evidence of inf ischemia);  d. 11/2012 Echo: EF 15-20%;  e. 01/2013 s/p MDT [place] CRT D, ser # [id number];  f. 05/2013 Echo: EF 15%.  . Chronic systolic CHF (congestive heart failure) ([place])    a. 11/2012 Echo: EF 15-20%;  b. 05/2013 TEE EF 15%.  . CKD (chronic kidney disease), stage III ([place])    creatinin-1.44 in 1/09; 1.51 in 1/10  . Degenerative joint disease    of knees, shoulder, and hips  . Enterococcal infection    a. 10/2005 - AICD-explanted  . GERD (gastroesophageal reflux disease)   .  Hilar density    a. infrahilar mass/adenopathy on CT scan 5/07; subsequently  resolved  . History of blood transfusion   . Hyperlipidemia   . Hypertension   . Hypothyroidism   . ICD (implantable cardioverter-defibrillator) infection ([place])    removed 2016  . Implantable cardioverter-defibrillator-CRT- Mdt    a.  01/2013 s/p MDT [place] CRT D, ser # [id number] - ICD has been removed  . LBBB (left bundle branch  block)   . Obstructive sleep apnea    uses cpap  . PAF (paroxysmal atrial fibrillation) ([place])    a. 07/2012 s/p TEE/DCCV;  b. chronic coumadin;  c. 05/2013 Recurrent Afib->TEE/DCCV and amio initiation.  . Peripheral vascular disease ([place])   . Pneumonia 07/2005  . Pulmonary embolism ([place])    a. 07/2005 after total right hip arthroplasty  . Tobacco abuse    a. discontinued in 1997, and then resumed  . Type II diabetes mellitus ([place])    type 2  . Urinary incontinence   . Villous adenoma of colon    a. tubovillous adenomatous polyp with focal high grade dysplasia; presented with hematochezia - followed by Dr. Laural Golden.    Past Surgical History:  Procedure Laterality Date  . A-V CARDIAC PACEMAKER INSERTION  12/05   Biventricular pacemaker/AICD  . ABDOMINAL HYSTERECTOMY  1990/92   Initial partial hysterectomy followed by BSO  . BASCILIC VEIN TRANSPOSITION Right 03/05/2017   Procedure: RIGHT 1ST STAGE BASCILIC VEIN TRANSPOSITION;  Surgeon: Serafina Mitchell, MD;  Location: [place];  Service: Vascular;  Laterality: Right;  . BASCILIC VEIN TRANSPOSITION Right 05/28/2017   Procedure: BASILIC VEIN TRANSPOSITION SECOND STAGE RIGHT;  Surgeon: Serafina Mitchell, MD;  Location: [facility];  Service: Vascular;  Laterality: Right;  . BI-VENTRICULAR IMPLANTABLE CARDIOVERTER DEFIBRILLATOR N/A 01/24/2013   Procedure: BI-VENTRICULAR IMPLANTABLE CARDIOVERTER DEFIBRILLATOR  (CRT-D);  Surgeon: Evans Lance, MD;  Location[facility] CATH LAB;  Service: Cardiovascular;  Laterality: N/A;  . BI-VENTRICULAR IMPLANTABLE CARDIOVERTER DEFIBRILLATOR  (CRT-D)  01/24/2013  . CARDIAC CATHETERIZATION    . CARDIOVERSION N/A 08/20/2012   Procedure: TEE GUIDED CARDIOVERSION;  Surgeon: Yehuda Savannah, MD;  Location: [facility];  Service: Cardiovascular;  Laterality: N/A;  To be done @ bedside  . CARDIOVERSION N/A 06/20/2013   Procedure: CARDIOVERSION;  Surgeon: Dorothy Spark, MD;  Location: [place];  Service: Cardiovascular;  Laterality:  N/A;  . CATARACT EXTRACTION W/PHACO Left 08/07/2015   Procedure: CATARACT EXTRACTION PHACO AND INTRAOCULAR LENS PLACEMENT ([place]);  Surgeon: Rutherford Guys, MD;  Location: [facility];  Service: Ophthalmology;  Laterality: Left;  CDE: 7.88  . CATARACT EXTRACTION W/PHACO Right 08/21/2015   Procedure: CATARACT EXTRACTION PHACO AND INTRAOCULAR LENS PLACEMENT (IOC);  Surgeon: Rutherford Guys, MD;  Location: [facility];  Service: Ophthalmology;  Laterality: Right;  CDE:8.55  . COLONOSCOPY Left 10/24/2014   Procedure: COLONOSCOPY;  Surgeon: Carol Ada, MD;  Location: [facility] ENDOSCOPY;  Service: Endoscopy;  Laterality: Left;  . COLONOSCOPY W/ POLYPECTOMY  2009  . COLONOSCOPY WITH ESOPHAGOGASTRODUODENOSCOPY (EGD) N/A 06/10/2013   Procedure: COLONOSCOPY WITH ESOPHAGOGASTRODUODENOSCOPY (EGD);  Surgeon: Rogene Houston, MD;  [facility];  Service: Endoscopy;  Laterality: N/A;  925  . ESOPHAGOGASTRODUODENOSCOPY N/A 10/21/2014   Procedure: ESOPHAGOGASTRODUODENOSCOPY (EGD);  Surgeon: Inda Castle, MD;  Location: [place];  Service: Endoscopy;  Laterality: N/A;  . GIVENS CAPSULE STUDY N/A 10/24/2014   Procedure: GIVENS CAPSULE STUDY;  Surgeon: Carol Ada, MD;  Location: [place];  Service: Endoscopy;  Laterality: N/A;  .  ICD LEAD REMOVAL N/A 12/13/2014   Procedure: ICD LEAD REMOVAL/EXTRACTION ;  Surgeon: Evans Lance, MD;  Location: [place];  Service: Cardiovascular;  Laterality: N/A;  [place] back up  . KNEE ARTHROSCOPY Right 1980's?  Marland Kitchen PACEMAKER REMOVAL  11/18/05   Enterococcal infection  . RIGHT HEART CATHETERIZATION N/A 04/18/2014   Procedure: RIGHT HEART CATH;  Surgeon: Larey Dresser, MD;  Location: [facility] CATH LAB;  Service: Cardiovascular;  Laterality: N/A;  . TEE WITHOUT CARDIOVERSION N/A 08/20/2012   Procedure: TRANSESOPHAGEAL ECHOCARDIOGRAM (TEE);  Surgeon: Yehuda Savannah, MD;  [facility];  Service: Cardiovascular;  Laterality: N/A;  . TEE WITHOUT CARDIOVERSION N/A 06/20/2013   Procedure:  TRANSESOPHAGEAL ECHOCARDIOGRAM (TEE);  Surgeon: Dorothy Spark, MD;  Location: [place];  Service: Cardiovascular;  Laterality: N/A;  . TEE WITHOUT CARDIOVERSION N/A 12/08/2014   Procedure: TRANSESOPHAGEAL ECHOCARDIOGRAM (TEE);  Surgeon: Fay Records, MD;  [facility];  Service: Cardiovascular;  Laterality: N/A;  . TOTAL HIP ARTHROPLASTY Right 07/2005  . TUBAL LIGATION  1980's       Inpatient Medications: Scheduled Meds: . amiodarone  100 mg Oral Daily  . aspirin EC  81 mg Oral Daily  . calcitRIOL  0.25 mcg Oral Daily  . carvedilol  6.25 mg Oral BID WC  . feeding supplement (ENSURE ENLIVE)  237 mL Oral TID BM  . ferrous sulfate  325 mg Oral BID  . guaiFENesin  600 mg Oral BID  . insulin aspart  0-20 Units Subcutaneous TID WC  . insulin aspart  0-5 Units Subcutaneous QHS  . insulin aspart  6 Units Subcutaneous TID WC  . insulin glargine  14 Units Subcutaneous Daily  . ipratropium-albuterol  3 mL Nebulization BID  . isosorbide mononitrate  30 mg Oral Daily  . levothyroxine  50 mcg Oral QAC breakfast  . methylPREDNISolone (SOLU-MEDROL) injection  40 mg Intravenous Q6H  . multivitamin with minerals  1 tablet Oral Daily  . polyethylene glycol  17 g Oral Daily  . pravastatin  80 mg Oral QHS  . sodium chloride flush  3 mL Intravenous Q12H  . warfarin  0.5 mg Oral ONCE-1800  . Warfarin - Pharmacist Dosing Inpatient   Does not apply q1800   Continuous Infusions: . sodium chloride     PRN Meds: sodium chloride, acetaminophen **OR** acetaminophen, albuterol, ondansetron **OR** ondansetron (ZOFRAN) IV, oxyCODONE-acetaminophen **AND** oxyCODONE, senna-docusate, sodium chloride flush  Allergies:   No Known Allergies  Social History:   Social History   Socioeconomic History  . Marital status: Married    Spouse name: Not on file  . Number of children: Not on file  . Years of education: Not on file  . Highest education level: Not on file  Social Needs  . Financial  resource strain: Not on file  . Food insecurity - worry: Not on file  . Food insecurity - inability: Not on file  . Transportation needs - medical: Not on file  . Transportation needs - non-medical: Not on file  Occupational History  . Not on file  Tobacco Use  . Smoking status: Former Smoker    Years: 12.00    Types: Cigarettes  . Smokeless tobacco: Never Used  . Tobacco comment: 05/2013: Smokes an occasional cigarette.  Says that she doesn't inhale.  Substance and Sexual Activity  . Alcohol use: No    Alcohol/week: 0.0 oz  . Drug use: No  . Sexual activity: Not Currently    Birth control/protection: None  Other Topics Concern  . Not on file  Social History Narrative   Married, lives in [place] with spouse. Retired Secretary/administrator.     Family History:   No premature CAD.  Family History  Problem Relation Age of Onset  . Hypertension Mother   . Diabetes Mother   . Coronary artery disease Father   . Diabetes Brother   . Hypertension Brother   . Lung cancer Brother   . Arthritis Other   . Diabetes Other   . Heart disease Other        female < 55     ROS:  Please see the history of present illness.  ROS  All other ROS reviewed and negative.     Physical Exam/Data:   Vitals:   07/03/17 2155 07/04/17 0535 07/04/17 1020 07/04/17 1300  BP:  102/68  98/67  Pulse:  (!) 104  65  Resp:  18  18  Temp:  (!) 97.5 F (36.4 C)  98.2 F (36.8 C)  TempSrc:  Oral  Oral  SpO2: 97% 98% 99% 98%  Weight:  186 lb 14.4 oz (84.8 kg)    Height:        Intake/Output Summary (Last 24 hours) at 07/04/2017 1651 Last data filed at 07/04/2017 1300 Gross per 24 hour  Intake 720 ml  Output 900 ml  Net -180 ml   Filed Weights   07/02/17 2238 07/03/17 0459 07/04/17 0535  Weight: 192 lb 7.4 oz (87.3 kg) 191 lb 5.8 oz (86.8 kg) 186 lb 14.4 oz (84.8 kg)   Body mass index is 31.1 kg/m.  General:  Elderly, chronically ill appearing, no distress HEENT: normal Lymph: no adenopathy Neck:  no JVD Endocrine:  No thryomegaly Cardiac:  normal S1, split S2; regular rate, irregular rhythm Lungs:  Bibasilar dry crackles. Abd: soft, nontender, no hepatomegaly  Ext: Trace lower extremity edema bilaterally. Musculoskeletal:  No deformities Skin: warm and dry  Neuro:   no focal abnormalities noted Psych:  Normal affect     Relevant CV Studies:  Echo (06/27/17):  - Left ventricle: The cavity size was normal. Wall thickness was   increased in a pattern of mild LVH. Systolic function was   severely reduced. The estimated ejection fraction was in the   range of 20% to 25%. Diffuse hypokinesis. The study is not   technically sufficient to allow evaluation of LV diastolic   function. - Aortic valve: Moderately calcified annulus. Trileaflet;   moderately thickened leaflets. There was mild regurgitation.   Valve area (VTI): 1.56 cm^2. Valve area (Vmax): 1.66 cm^2. - Mitral valve: Mildly calcified annulus. Mildly thickened leaflets   . There was mild regurgitation. - Left atrium: The atrium was severely dilated. - Right ventricle: The cavity size was mildly to moderately   dilated. - Right atrium: The atrium was moderately dilated.  Laboratory Data:  Chemistry Recent Labs  Lab 07/02/17 1131 07/03/17 0501 07/04/17 0349  NA 135 134* 129*  K 4.4 4.5 4.7  CL 99* 99* 99*  CO2 17* 19* 19*  GLUCOSE 189* 241* 208*  BUN 109* 115* 128*  CREATININE 4.11* 4.14* 3.93*  CALCIUM 9.5 9.3 9.4  GFRNONAA 10* 10* 10*  GFRAA 11* 11* 12*  ANIONGAP 19* 16* 11    Recent Labs  Lab 07/02/17 1131  PROT 6.8  ALBUMIN 3.6  AST 36  ALT 160*  ALKPHOS 52  BILITOT 1.1   Hematology Recent Labs  Lab 06/30/17 0505 07/02/17 1131 07/03/17 0501  WBC  5.5 7.4 4.3  RBC 3.43* 3.60* 3.48*  HGB 10.8* 11.1* 10.6*  HCT 34.1* 35.5* 33.9*  MCV 99.4 98.6 97.4  MCH 31.5 30.8 30.5  MCHC 31.7 31.3 31.3  RDW 15.3 15.9* 16.1*  PLT 206 242 207   Cardiac EnzymesNo results for input(s): TROPONINI in  the last 168 hours.  Recent Labs  Lab 07/02/17 1159  TROPIPOC 0.05    BNP Recent Labs  Lab 07/02/17 1131 07/03/17 0501  BNP 1,605.1* 2,179.2*    DDimer No results for input(s): DDIMER in the last 168 hours.  Radiology/Studies:  Dg Chest 2 View  Result Date: 07/02/2017 CLINICAL DATA:  Productive cough and shortness of breath for the past week. EXAM: CHEST  2 VIEW COMPARISON:  Chest x-ray dated June 26, 2017. FINDINGS: Stable mild cardiomegaly. Normal pulmonary vascularity. Bibasilar scarring. No focal consolidation, pleural effusion, or pneumothorax. No acute osseous abnormality. IMPRESSION: Stable cardiomegaly and bibasilar scarring. No active cardiopulmonary disease. Electronically Signed   By: Titus Dubin M.D.   On: 07/02/2017 12:42    Assessment and Plan:   1.  Shortness of breath and fatigue: This is likely multifactorial in etiology and being contributed to by COPD, chronic systolic heart failure with severely reduced left ventricular systolic function, and uremia.  She has been evaluated by nephrology she appears to have chronic kidney disease stage IV versus stage V.  She is very mildly volume overloaded.  Vascular surgery has been asked to assess her AV fistula as dialysis is being considered.  2.  Chronic systolic heart failure/nonischemic cardiomyopathy: She does not appear to be significantly volume overloaded. Given her advanced chronic kidney disease, dialysis is being considered.  Lasix is currently on hold.  I suspect her current symptoms are multifactorial etiology as noted above.  She is previously been evaluated by the advanced heart failure service and deemed not to be a good LVAD candidate.  She is not a candidate for cardiac resynchronization therapy for reasons noted in the HPI.  She is not a candidate for spironolactone or ACE inhibitors/angiotensin receptor blockers due to advanced kidney disease.  3.  COPD exacerbation: She appears to be stable from this  standpoint and has been given steroids and nebulizer treatments.  4.  Chronic atrial fibrillation: Heart rate is controlled on carvedilol.  She is on low-dose amiodarone.  She is anticoagulated with warfarin.  INR slightly supratherapeutic at 3.1 today.  This is a decrease from 3.37 yesterday.  5.  Anemia: This is likely due to chronic kidney disease.  Hemoglobin stable at 10.6 yesterday.  6. OSA: On CPAP.   For questions or updates, please contact [place] Please consult [email or web address] for contact info under Cardiology/STEMI.   Signed, Kate Sable, MD  07/04/2017 4:51 PM

## 2017-07-04 NOTE — Progress Notes (Signed)
ANTICOAGULATION CONSULT NOTE - Follow Up Consult  Pharmacy Consult for warfarin Indication: atrial fibrillation and VTE prophylaxis  No Known Allergies  Patient Measurements: Height: 5\' 5"  (165.1 cm) Weight: 186 lb 14.4 oz (84.8 kg)(c scale) IBW/kg (Calculated) : 57   Vital Signs: Temp: 97.5 F (36.4 C) (01/12 0535) Temp Source: Oral (01/12 0535) BP: 102/68 (01/12 0535) Pulse Rate: 104 (01/12 0535)  Labs: Recent Labs    07/02/17 1131 07/02/17 1354 07/03/17 0501 07/04/17 0349  HGB 11.1*  --  10.6*  --   HCT 35.5*  --  33.9*  --   PLT 242  --  207  --   LABPROT  --  30.9* 33.9* 31.7*  INR  --  3.00 3.37 3.10  CREATININE 4.11*  --  4.14* 3.93*    Estimated Creatinine Clearance: 13.7 mL/min (A) (by C-G formula based on SCr of 3.93 mg/dL (H)).   Medical History: Past Medical History:  Diagnosis Date  . Anemia    a. mild/chronic  . Anemia due to GI blood loss 02/26/2016  . Anemia in chronic renal disease 11/30/2014  . Cardiomyopathy, nonischemic ([place])    a. 1999 nl cath;  b. 12/05 Guidant [place];  c. 10/2005 ICD extraction 2/2 enterococcus bacteremia and Veg on RV lead;  c. 05/2008 low risk Myoview (scarring w/ some evidence of inf ischemia);  d. 11/2012 Echo: EF 15-20%;  e. 01/2013 s/p MDT [place] CRT D, ser # [id number];  f. 05/2013 Echo: EF 15%.  . Chronic systolic CHF (congestive heart failure) ([place])    a. 11/2012 Echo: EF 15-20%;  b. 05/2013 TEE EF 15%.  . CKD (chronic kidney disease), stage III ([place])    creatinin-1.44 in 1/09; 1.51 in 1/10  . Degenerative joint disease    of knees, shoulder, and hips  . Enterococcal infection    a. 10/2005 - AICD-explanted  . GERD (gastroesophageal reflux disease)   . Hilar density    a. infrahilar mass/adenopathy on CT scan 5/07; subsequently  resolved  . History of blood transfusion   . Hyperlipidemia   . Hypertension   . Hypothyroidism   . ICD (implantable cardioverter-defibrillator) infection ([place])    removed 2016  .  Implantable cardioverter-defibrillator-CRT- Mdt    a.  01/2013 s/p MDT [place] CRT D, ser # [id number] - ICD has been removed  . LBBB (left bundle branch block)   . Obstructive sleep apnea    uses cpap  . PAF (paroxysmal atrial fibrillation) ([place])    a. 07/2012 s/p TEE/DCCV;  b. chronic coumadin;  c. 05/2013 Recurrent Afib->TEE/DCCV and amio initiation.  . Peripheral vascular disease ([place])   . Pneumonia 07/2005  . Pulmonary embolism ([place])    a. 07/2005 after total right hip arthroplasty  . Tobacco abuse    a. discontinued in 1997, and then resumed  . Type II diabetes mellitus ([place])    type 2  . Urinary incontinence   . Villous adenoma of colon    a. tubovillous adenomatous polyp with focal high grade dysplasia; presented with hematochezia - followed by Dr. Laural Golden.    Medications:  Scheduled:  . amiodarone  100 mg Oral Daily  . aspirin EC  81 mg Oral Daily  . calcitRIOL  0.25 mcg Oral Daily  . carvedilol  6.25 mg Oral BID WC  . feeding supplement (ENSURE ENLIVE)  237 mL Oral TID BM  . ferrous sulfate  325 mg Oral BID  . guaiFENesin  600 mg Oral BID  .  insulin aspart  0-20 Units Subcutaneous TID WC  . insulin aspart  0-5 Units Subcutaneous QHS  . insulin aspart  4 Units Subcutaneous TID WC  . insulin glargine  14 Units Subcutaneous Daily  . ipratropium-albuterol  3 mL Nebulization BID  . isosorbide mononitrate  30 mg Oral Daily  . levothyroxine  50 mcg Oral QAC breakfast  . methylPREDNISolone (SOLU-MEDROL) injection  40 mg Intravenous Q6H  . multivitamin with minerals  1 tablet Oral Daily  . polyethylene glycol  17 g Oral Daily  . pravastatin  80 mg Oral QHS  . sodium chloride flush  3 mL Intravenous Q12H    Assessment: 72 y.o female presented with SOB and recent admit on 1/4 for CHF exacerbation.  On warfarin for afib prior to admission with therapeutic INR 3 upon admission.   Home regimen: 1.25 mg on MWF and 2.5 mg all other days (weekly dose 13.75mg ).  INR trended down  from 3.37 to 3.1 after holding dose last night. Last dose was 2.5 mg on 1/11 at 0300. Hgb was 10.6 and platelets WNL on last check. No signs/symptoms of bleeding. On concurrent methylpredisolone for COPD exacerbation, which can impact warfarin sensitivity.   Goal of Therapy:  INR 2-3 Monitor platelets by anticoagulation protocol: Yes   Plan:  Warfarin 0.5 mg x1 tonight Monitor daily INR, CBC, signs/symptoms of bleeding  Thank you for allowing [place] to participate in this patients care.  Doylene Canard, PharmD Clinical Pharmacist  Pager: [phone] Clinical Phone for 07/04/2017 until 3:30pm: [phone] If after 3:30pm, please call main pharmacy at [phone] 07/04/2017 12:52 PM

## 2017-07-04 NOTE — Evaluation (Signed)
Physical Therapy Evaluation Patient Details Name: [name] MRN: [id number] DOB: 06/30/1945 Today's Date: 07/04/2017   History of Present Illness  [name] is a 72 y.o. female PHMx: nonischemic cardiomyopathy with EF of 20%, A. fib, DM2, CKD stage IV, anemia of CKD, obstructive sleep apnea on CPAP, dyslipidemia and obesity presented to the ED with increasing shortness of breath since discharge from the hospital on June 30, 2017. Found to have COPD exacerbation and possibly worsening congestive heart failure   Clinical Impression  Pt admitted with above diagnosis. Pt currently with functional limitations due to the deficits listed below (see PT Problem List). On eval, pt required min assist bed mobility, min assist transfers, and min guard assist ambulation 50 feet with RW. SpO2 96% during ambulation on RA. Pt will benefit from skilled PT to increase their independence and safety with mobility to allow discharge to the venue listed below.       Follow Up Recommendations Home health PT;Supervision/Assistance - 24 hour    Equipment Recommendations  None recommended by PT    Recommendations for Other Services       Precautions / Restrictions Precautions Precautions: Fall Restrictions Weight Bearing Restrictions: No      Mobility  Bed Mobility Overal bed mobility: Needs Assistance Bed Mobility: Supine to Sit     Supine to sit: Min assist;HOB elevated Sit to supine: Min assist   General bed mobility comments: +rail, cues for sequencing, increased time and effort  Transfers Overall transfer level: Needs assistance Equipment used: Rolling walker (2 wheeled) Transfers: Sit to/from Stand Sit to Stand: Min assist         General transfer comment: cues for hand placement, assist to power up  Ambulation/Gait Ambulation/Gait assistance: Min guard Ambulation Distance (Feet): 50 Feet Assistive device: Rolling walker (2 wheeled) Gait Pattern/deviations: Step-through  pattern;Decreased stride length Gait velocity: decreased Gait velocity interpretation: Below normal speed for age/gender General Gait Details: demo slow, labored mobility with c/o SOB. SpO2 96% on RA.  Stairs            Wheelchair Mobility    Modified Rankin (Stroke Patients Only)       Balance Overall balance assessment: Needs assistance Sitting-balance support: No upper extremity supported;Feet supported Sitting balance-Leahy Scale: Good     Standing balance support: Bilateral upper extremity supported;During functional activity Standing balance-Leahy Scale: Poor Standing balance comment: reliant on RW                             Pertinent Vitals/Pain Pain Assessment: 0-10 Pain Score: 3  Pain Location: back Pain Descriptors / Indicators: Sore Pain Intervention(s): Monitored during session    Home Living Family/patient expects to be discharged to:: Private residence Living Arrangements: Spouse/significant other Available Help at Discharge: Family;Available 24 hours/day Type of Home: House Home Access: Stairs to enter Entrance Stairs-Rails: Left;Right;Can reach both Entrance Stairs-Number of Steps: 3 Home Layout: One level Home Equipment: Walker - 4 wheels;Cane - single point;Wheelchair - [employer];Shower seat;Electric scooter      Prior Function Level of Independence: Needs assistance   Gait / Transfers Assistance Needed: Continued increased weakness since D/C from hospital on 06/30/17 requiring A of RW and A of husband or daughter  ADL's / Homemaking Assistance Needed: Dtr or husband does LB ADLs, pt helps with UB ADLs        Hand Dominance   Dominant Hand: Right    Extremity/Trunk Assessment  Upper Extremity Assessment Upper Extremity Assessment: Overall WFL for tasks assessed    Lower Extremity Assessment Lower Extremity Assessment: Generalized weakness    Cervical / Trunk Assessment Cervical / Trunk Assessment:  Normal  Communication   Communication: No difficulties  Cognition Arousal/Alertness: Awake/alert Behavior During Therapy: WFL for tasks assessed/performed Overall Cognitive Status: Within Functional Limits for tasks assessed                                        General Comments      Exercises     Assessment/Plan    PT Assessment Patient needs continued PT services  PT Problem List Decreased strength;Decreased mobility;Decreased activity tolerance;Cardiopulmonary status limiting activity;Decreased balance;Pain       PT Treatment Interventions Gait training;Stair training;Functional mobility training;Therapeutic activities;Therapeutic exercise;Patient/family education;Balance training    PT Goals (Current goals can be found in the Care Plan section)  Acute Rehab PT Goals Patient Stated Goal: home PT Goal Formulation: With patient/family Time For Goal Achievement: 07/18/17 Potential to Achieve Goals: Good    Frequency Min 3X/week   Barriers to discharge        Co-evaluation               AM-PAC PT "6 Clicks" Daily Activity  Outcome Measure Difficulty turning over in bed (including adjusting bedclothes, sheets and blankets)?: None Difficulty moving from lying on back to sitting on the side of the bed? : A Lot Difficulty sitting down on and standing up from a chair with arms (e.g., wheelchair, bedside commode, etc,.)?: A Lot Help needed moving to and from a bed to chair (including a wheelchair)?: A Little Help needed walking in hospital room?: A Little Help needed climbing 3-5 steps with a railing? : A Lot 6 Click Score: 16    End of Session Equipment Utilized During Treatment: Gait belt Activity Tolerance: Patient tolerated treatment well Patient left: in bed;with call bell/phone within reach;with family/visitor present Nurse Communication: Mobility status PT Visit Diagnosis: Unsteadiness on feet (R26.81);Other abnormalities of gait and  mobility (R26.89);Muscle weakness (generalized) (M62.81)    Time: 6294-7654 PT Time Calculation (min) (ACUTE ONLY): 19 min   Charges:   PT Evaluation $PT Eval Moderate Complexity: 1 Mod     PT G Codes:        [name], PT  Office # [phone] Pager [phone]   [name] 07/04/2017, 12:57 PM

## 2017-07-04 NOTE — Consult Note (Signed)
Hospital Consult    Reason for Consult:  Evaluate right arm avf Referring Physician:  Jonnie Finner MRN #:  [id number]  History of Present Illness: This is a 72 y.o. female with history of advanced ckd. Has undergone a right 2 stage bvt that has healed well. She has been admitted since 1/10 with shortness of breath. Dialysis is imminent but not being initiated now. Consult to evaluate fistula as it appears patient missed recent office visit. Her right hand is doing well without complaint. She is on coumadin.  Past Medical History:  Diagnosis Date  . Anemia    a. mild/chronic  . Anemia due to GI blood loss 02/26/2016  . Anemia in chronic renal disease 11/30/2014  . Cardiomyopathy, nonischemic ([place])    a. 1999 nl cath;  b. 12/05 [place];  c. 10/2005 ICD extraction 2/2 enterococcus bacteremia and Veg on RV lead;  c. 05/2008 low risk Myoview (scarring w/ some evidence of inf ischemia);  d. 11/2012 Echo: EF 15-20%;  e. 01/2013 s/p MDT [place] CRT D, ser # [id number];  f. 05/2013 Echo: EF 15%.  . Chronic systolic CHF (congestive heart failure) ([place])    a. 11/2012 Echo: EF 15-20%;  b. 05/2013 TEE EF 15%.  . CKD (chronic kidney disease), stage III ([place])    creatinin-1.44 in 1/09; 1.51 in 1/10  . Degenerative joint disease    of knees, shoulder, and hips  . Enterococcal infection    a. 10/2005 - AICD-explanted  . GERD (gastroesophageal reflux disease)   . Hilar density    a. infrahilar mass/adenopathy on CT scan 5/07; subsequently  resolved  . History of blood transfusion   . Hyperlipidemia   . Hypertension   . Hypothyroidism   . ICD (implantable cardioverter-defibrillator) infection ([place])    removed 2016  . Implantable cardioverter-defibrillator-CRT- Mdt    a.  01/2013 s/p MDT [place] CRT D, ser # [id number] - ICD has been removed  . LBBB (left bundle branch block)   . Obstructive sleep apnea    uses cpap  . PAF (paroxysmal atrial fibrillation) ([place])    a. 07/2012 s/p TEE/DCCV;  b. chronic  coumadin;  c. 05/2013 Recurrent Afib->TEE/DCCV and amio initiation.  . Peripheral vascular disease ([place])   . Pneumonia 07/2005  . Pulmonary embolism ([place])    a. 07/2005 after total right hip arthroplasty  . Tobacco abuse    a. discontinued in 1997, and then resumed  . Type II diabetes mellitus ([place])    type 2  . Urinary incontinence   . Villous adenoma of colon    a. tubovillous adenomatous polyp with focal high grade dysplasia; presented with hematochezia - followed by Dr. Laural Golden.    Past Surgical History:  Procedure Laterality Date  . A-V CARDIAC PACEMAKER INSERTION  12/05   Biventricular pacemaker/AICD  . ABDOMINAL HYSTERECTOMY  1990/92   Initial partial hysterectomy followed by BSO  . BASCILIC VEIN TRANSPOSITION Right 03/05/2017   Procedure: RIGHT 1ST STAGE BASCILIC VEIN TRANSPOSITION;  Surgeon: Serafina Mitchell, MD;  Location: [place] [facility];  Service: Vascular;  Laterality: Right;  . BASCILIC VEIN TRANSPOSITION Right 05/28/2017   Procedure: BASILIC VEIN TRANSPOSITION SECOND STAGE RIGHT;  Surgeon: Serafina Mitchell, MD;  [facility];  Service: Vascular;  Laterality: Right;  . BI-VENTRICULAR IMPLANTABLE CARDIOVERTER DEFIBRILLATOR N/A 01/24/2013   Procedure: BI-VENTRICULAR IMPLANTABLE CARDIOVERTER DEFIBRILLATOR  (CRT-D);  Surgeon: Evans Lance, MD;  Location: [facility] CATH LAB;  Service: Cardiovascular;  Laterality: N/A;  . BI-VENTRICULAR IMPLANTABLE  CARDIOVERTER DEFIBRILLATOR  (CRT-D)  01/24/2013  . CARDIAC CATHETERIZATION    . CARDIOVERSION N/A 08/20/2012   Procedure: TEE GUIDED CARDIOVERSION;  Surgeon: Yehuda Savannah, MD;  Location: [facility];  Service: Cardiovascular;  Laterality: N/A;  To be done @ bedside  . CARDIOVERSION N/A 06/20/2013   Procedure: CARDIOVERSION;  Surgeon: Dorothy Spark, MD;  Location: [place];  Service: Cardiovascular;  Laterality: N/A;  . CATARACT EXTRACTION W/PHACO Left 08/07/2015   Procedure: CATARACT EXTRACTION PHACO AND INTRAOCULAR LENS PLACEMENT ([place]);   Surgeon: Rutherford Guys, MD;  Location: [facility];  Service: Ophthalmology;  Laterality: Left;  CDE: 7.88  . CATARACT EXTRACTION W/PHACO Right 08/21/2015   Procedure: CATARACT EXTRACTION PHACO AND INTRAOCULAR LENS PLACEMENT (IOC);  Surgeon: Rutherford Guys, MD;  Location: [facility];  Service: Ophthalmology;  Laterality: Right;  CDE:8.55  . COLONOSCOPY Left 10/24/2014   Procedure: COLONOSCOPY;  Surgeon: Carol Ada, MD;  Location[facility] ENDOSCOPY;  Service: Endoscopy;  Laterality: Left;  . COLONOSCOPY W/ POLYPECTOMY  2009  . COLONOSCOPY WITH ESOPHAGOGASTRODUODENOSCOPY (EGD) N/A 06/10/2013   Procedure: COLONOSCOPY WITH ESOPHAGOGASTRODUODENOSCOPY (EGD);  Surgeon: Rogene Houston, MD;  [facility];  Service: Endoscopy;  Laterality: N/A;  925  . ESOPHAGOGASTRODUODENOSCOPY N/A 10/21/2014   Procedure: ESOPHAGOGASTRODUODENOSCOPY (EGD);  Surgeon: Inda Castle, MD;  Location: [place];  Service: Endoscopy;  Laterality: N/A;  . GIVENS CAPSULE STUDY N/A 10/24/2014   Procedure: GIVENS CAPSULE STUDY;  Surgeon: Carol Ada, MD;  Location: [place];  Service: Endoscopy;  Laterality: N/A;  . ICD LEAD REMOVAL N/A 12/13/2014   Procedure: ICD LEAD REMOVAL/EXTRACTION ;  Surgeon: Evans Lance, MD;  Location: [place];  Service: Cardiovascular;  Laterality: N/A;  [place] back up  . KNEE ARTHROSCOPY Right 1980's?  Marland Kitchen PACEMAKER REMOVAL  11/18/05   Enterococcal infection  . RIGHT HEART CATHETERIZATION N/A 04/18/2014   Procedure: RIGHT HEART CATH;  Surgeon: Larey Dresser, MD;  Location: [facility] CATH LAB;  Service: Cardiovascular;  Laterality: N/A;  . TEE WITHOUT CARDIOVERSION N/A 08/20/2012   Procedure: TRANSESOPHAGEAL ECHOCARDIOGRAM (TEE);  Surgeon: Yehuda Savannah, MD;  [facility];  Service: Cardiovascular;  Laterality: N/A;  . TEE WITHOUT CARDIOVERSION N/A 06/20/2013   Procedure: TRANSESOPHAGEAL ECHOCARDIOGRAM (TEE);  Surgeon: Dorothy Spark, MD;  Location: [place];  Service: Cardiovascular;  Laterality:  N/A;  . TEE WITHOUT CARDIOVERSION N/A 12/08/2014   Procedure: TRANSESOPHAGEAL ECHOCARDIOGRAM (TEE);  Surgeon: Fay Records, MD;  [facility];  Service: Cardiovascular;  Laterality: N/A;  . TOTAL HIP ARTHROPLASTY Right 07/2005  . TUBAL LIGATION  1980's    No Known Allergies  Prior to Admission medications   Medication Sig Start Date End Date Taking? Authorizing Provider  amiodarone (PACERONE) 200 MG tablet Take 100 mg by mouth daily.  02/25/17  Yes [provider]  calcitRIOL (ROCALTROL) 0.25 MCG capsule Take 0.25 mcg by mouth daily. 06/09/16  Yes [provider]  carvedilol (COREG) 6.25 MG tablet TAKE 1 TABLET BY MOUTH TWICE DAILY WITH A MEAL. 06/13/16  Yes Larey Dresser, MD  colchicine 0.6 MG tablet Take 0.6 mg by mouth 2 (two) times daily as needed (gout). Reported on 11/06/2015 12/20/13  Yes [provider]  feeding supplement, ENSURE ENLIVE, (ENSURE ENLIVE) LIQD Take 237 mLs by mouth 2 (two) times daily between meals. Patient taking differently: Take 237 mLs by mouth daily with lunch.  10/26/14  Yes Riccardo Dubin, MD  ferrous sulfate 324 (65 FE) MG TBEC Take 1 tablet (  325 mg total) by mouth 2 (two) times daily. 12/02/14  Yes Tat, Shanon Brow, MD  furosemide (LASIX) 40 MG tablet TAKE 1 TABLET BY MOUTH TWICE DAILY. 05/27/16  Yes Larey Dresser, MD  glimepiride (AMARYL) 2 MG tablet Take 2 mg by mouth daily with breakfast.  12/05/14  Yes [provider]  guaiFENesin (MUCINEX) 600 MG 12 hr tablet Take 1 tablet (600 mg total) by mouth 2 (two) times daily. 06/30/17  Yes Dhungel, Nishant, MD  guaiFENesin-dextromethorphan (ROBITUSSIN DM) 100-10 MG/5ML syrup Take 5 mLs by mouth every 4 (four) hours as needed for cough. 06/30/17  Yes Dhungel, Nishant, MD  isosorbide mononitrate (IMDUR) 30 MG 24 hr tablet Take 1 tablet (30 mg total) by mouth daily. 10/26/14  Yes Riccardo Dubin, MD  levothyroxine (SYNTHROID, LEVOTHROID) 50 MCG tablet Take 50 mcg by mouth daily before  breakfast. 02/25/17  Yes [provider]  lisinopril (PRINIVIL,ZESTRIL) 10 MG tablet Take 1 tablet (10 mg total) by mouth daily. Patient taking differently: Take 5 mg by mouth daily.  06/30/17  Yes Dhungel, Nishant, MD  metoCLOPramide (REGLAN) 5 MG tablet Take 5 mg by mouth 4 (four) times daily.   Yes [provider]  ondansetron (ZOFRAN) 4 MG tablet Take 4 mg by mouth every 4 (four) hours as needed for nausea or vomiting. Reported on 11/06/2015   Yes [provider]  oxyCODONE-acetaminophen (PERCOCET) 10-325 MG per tablet Take 1 tablet by mouth every 6 (six) hours as needed for pain.  12/09/13  Yes [provider]  polyethylene glycol powder (GLYCOLAX/MIRALAX) powder Take 17 g by mouth daily. 06/30/17  Yes Dhungel, Nishant, MD  pravastatin (PRAVACHOL) 40 MG tablet Take 80 mg by mouth at bedtime.    Yes [provider]  senna-docusate (SENOKOT-S) 8.6-50 MG tablet Take 2 tablets by mouth daily as needed for mild constipation. 06/30/17 06/30/18 Yes Dhungel, Nishant, MD  spironolactone (ALDACTONE) 25 MG tablet TAKE 1/2 TABLET BY MOUTH DAILY. 03/18/16  Yes Bensimhon, Shaune Pascal, MD  warfarin (COUMADIN) 2.5 MG tablet Take 0.5-1 tablets (1.25-2.5 mg total) by mouth daily at 6 PM. Takes 1 tablet on Mon, Wed, Fri, take 0.5 tablet on all other days Patient taking differently: Take 1.25-2.5 mg by mouth daily at 6 PM. Take 0.5 tablet (1.25 mg) by mouth on Monday, Wednesday, & Friday then Take 1 tablet (2.5 mg) on all other days Sunday, Tuesday, Thursday, & Saturday 01/10/15  Yes Samuella Cota, MD    Social History   Socioeconomic History  . Marital status: Married    Spouse name: Not on file  . Number of children: Not on file  . Years of education: Not on file  . Highest education level: Not on file  Social Needs  . Financial resource strain: Not on file  . Food insecurity - worry: Not on file  . Food insecurity - inability: Not on file  . Transportation needs -  medical: Not on file  . Transportation needs - non-medical: Not on file  Occupational History  . Not on file  Tobacco Use  . Smoking status: Former Smoker    Years: 12.00    Types: Cigarettes  . Smokeless tobacco: Never Used  . Tobacco comment: 05/2013: Smokes an occasional cigarette.  Says that she doesn't inhale.  Substance and Sexual Activity  . Alcohol use: No    Alcohol/week: 0.0 oz  . Drug use: No  . Sexual activity: Not Currently    Birth control/protection: None  Other Topics  Concern  . Not on file  Social History Narrative   Married, lives in [place] with spouse. Retired Secretary/administrator.      Family History  Problem Relation Age of Onset  . Hypertension Mother   . Diabetes Mother   . Coronary artery disease Father   . Diabetes Brother   . Hypertension Brother   . Lung cancer Brother   . Arthritis Other   . Diabetes Other   . Heart disease Other        female < 55    REVIEW OF SYSTEMS (negative unless checked):   Cardiac:  []  Chest pain or chest pressure? []  Shortness of breath upon activity? []  Shortness of breath when lying flat? []  Irregular heart rhythm?  Vascular:  []  Pain in calf, thigh, or hip brought on by walking? []  Pain in feet at night that wakes you up from your sleep? []  Blood clot in your veins? []  Leg swelling?  Pulmonary:  []  Oxygen at home? []  Productive cough? [x]  Wheezing?  Neurologic:  []  Sudden weakness in arms or legs? []  Sudden numbness in arms or legs? []  Sudden onset of difficult speaking or slurred speech? []  Temporary loss of vision in one eye? []  Problems with dizziness?  Gastrointestinal:  []  Blood in stool? []  Vomited blood?  Genitourinary:  []  Burning when urinating? []  Blood in urine?  Psychiatric:  []  Major depression  Hematologic:  []  Bleeding problems? []  Problems with blood clotting?  Dermatologic:  []  Rashes or ulcers?  Constitutional:  []  Fever or chills?  Ear/Nose/Throat:  []  Change in  hearing? []  Nose bleeds? []  Sore throat?  Musculoskeletal:  []  Back pain? []  Joint pain? []  Muscle pain?   Physical Examination  Vitals:   07/04/17 1020 07/04/17 1300  BP:  98/67  Pulse:  65  Resp:  18  Temp:  98.2 F (36.8 C)  SpO2: 99% 98%   Body mass index is 31.1 kg/m.  General:  WDWN in NAD Gait: Not observed HENT: WNL, normocephalic Pulmonary: normal non-labored breathing, without Rales, rhonchi,  wheezing Cardiac: palpable right radial pulse Abdomen: soft, NT/ND, no masses Musculoskeletal: right arm avf with thrill and mild pulsatility.  Neurologic: A&O X 3; SENSATION: normal; MOTOR FUNCTION:  moving all extremities equally. Speech is fluent/normal Psychiatric:  Appropriate mood and affect  CBC    Component Value Date/Time   WBC 4.3 07/03/2017 0501   RBC 3.48 (L) 07/03/2017 0501   HGB 10.6 (L) 07/03/2017 0501   HCT 33.9 (L) 07/03/2017 0501   PLT 207 07/03/2017 0501   MCV 97.4 07/03/2017 0501   MCH 30.5 07/03/2017 0501   MCHC 31.3 07/03/2017 0501   RDW 16.1 (H) 07/03/2017 0501   LYMPHSABS 1.2 07/02/2017 1131   MONOABS 0.4 07/02/2017 1131   EOSABS 0.0 07/02/2017 1131   BASOSABS 0.0 07/02/2017 1131    BMET    Component Value Date/Time   NA 129 (L) 07/04/2017 0349   K 4.7 07/04/2017 0349   CL 99 (L) 07/04/2017 0349   CO2 19 (L) 07/04/2017 0349   GLUCOSE 208 (H) 07/04/2017 0349   BUN 128 (H) 07/04/2017 0349   CREATININE 3.93 (H) 07/04/2017 0349   CREATININE 1.17 (H) 01/20/2013 1010   CALCIUM 9.4 07/04/2017 0349   GFRNONAA 10 (L) 07/04/2017 0349   GFRAA 12 (L) 07/04/2017 0349    COAGS: Lab Results  Component Value Date   INR 3.10 07/04/2017   INR 3.37 07/03/2017   INR 3.00 07/02/2017  Non-Invasive Vascular Imaging:   No new imaging  ASSESSMENT/PLAN: This is a 72 y.o. female with advanced ckd here with pulmonary complaints and will likely need avf soon that is 2 stage bvt in right arm. It is likely ok to use but given the pulsatility  I have ordered a dialysis duplex to be done while she is here given that she missed her recent office visit. She is on coumadin and infiltration event could significantly delay fistula use. Further recs to follow duplex study.  Braeden Kennan C. Donzetta Matters, MD [facility] Office: [phone] Pager: [phone]

## 2017-07-04 NOTE — Progress Notes (Signed)
PROGRESS NOTE  [name] GEX:[id number] DOB: 15-Aug-1945 DOA: 07/02/2017 PCP: Lemmie Evens, MD  HPI/Recap of past 24 hours: HPI from Dr Randa Spike on 07/02/17  [name] is a 72 y.o. female with medical history significant of NICM with EF of 20% s/p ICD, A. 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 Since discharge from the hospital she has done poorly according to her son and her husband.  She presented to our ER with worsening shortness of breath and generalized fatigue. In the ED, she was given a nebulizer treatment and seemed to improve some with a dose of steroid. Pt admitted for further management.  Today, patient reported feeling better, breathing improved.  Still complains of generalized weakness.  Denies any chest pain, fever/chills, nausea/vomiting.  Assessment/Plan: Principal Problem:   COPD (chronic obstructive pulmonary disease) (HCC) Active Problems:   Dyslipidemia   HTN (hypertension)   History of pulmonary embolism   Type 2 diabetes, uncontrolled, with renal manifestation (HCC)   Chronic renal disease, stage 4, severely decreased glomerular filtration rate (GFR) between 15-29 mL/min/1.73 square meter (HCC)   Chronic anticoagulation   Atrial fibrillation (HCC)   Implantable cardioverter-defibrillator-CRT- Mdt   Anemia in chronic renal disease   Hypothyroidism   SOB (shortness of breath)   Acute on chronic systolic CHF (congestive heart failure), NYHA class 4 (HCC)   Palliative care by specialist   Goals of care, counseling/discussion   Generalized weakness  COPD exacerbation Worsening SOB likely multifactorial due to CHF, uremia No cough, afebrile, no leukocytosis CXR: Mild cardiomegaly, no vas  congestion Continue duonebs, solu-medrol PRN O2   Acute on chronic systolic HF New York Heart Association class IV Doesn't appear overloaded BNP 2179, CXR as above ECHO EF 20-25%, s/p ICD X 2, removed due to infection  Will hold lasix PO 40 mg daily, and lisinopril, continue coreg and imdur Strict I&O, daily weights Cardiology consulted, palliative consulted  Acute on CKD stage IV Vs ESRD Likely Uremic Cr elevated to 4, baseline around 3.5 Held lisinopril and lasix, avoid nephrotoxins and monitor closely Nephrology consulted, appreciate recs  Chronic Afib Rate controlled Continue coumadin, coreg, amiodarone  Type 2 diabetes A1c 6.6 SSI, lantus, TID aspart Hypoglycemic protocol  Anemia of chronic renal disease Continue PO iron  Dyslipidemia Continue pravastatin  Hypertension BP soft, held lisinopril Continue coreg, imdur  Hypothyroidism Continue levothyroxine  Goals of care discussion Palliative consulted, husband wants full code and everything done including dialysis   Code Status:  Full  Family Communication: None at bedside  Disposition Plan: Recommend SNF, pt and family refusing   Consultants:  Cardiology  Nephrology  Palliative  Procedures:  None  Antimicrobials:  None  DVT prophylaxis:  Coumadin   Objective: Vitals:   07/03/17 2155 07/04/17 0535 07/04/17 1020 07/04/17 1300  BP:  102/68  98/67  Pulse:  (!) 104  65  Resp:  18  18  Temp:  (!) 97.5 F (36.4 C)  98.2 F (36.8 C)  TempSrc:  Oral  Oral  SpO2: 97% 98% 99% 98%  Weight:  84.8 kg (186 lb 14.4 oz)    Height:        Intake/Output Summary (Last 24 hours) at 07/04/2017 1608 Last data filed at 07/04/2017 0900 Gross per  24 hour  Intake 480 ml  Output 900 ml  Net -420 ml   Filed Weights   07/02/17 2238 07/03/17 0459 07/04/17 0535  Weight: 87.3 kg (192 lb 7.4 oz) 86.8 kg (191 lb 5.8 oz) 84.8 kg (186 lb 14.4 oz)    Exam:   General: Alert, awake,  NAD  Cardiovascular: S1,S2 present, SEM  Respiratory: Clear to auscultation bilaterally  Abdomen:  Soft, non-tender, non-distended, BS present  Musculoskeletal: +1 bilateral pedal edema  Skin: Normal  Psychiatry: Normal mood   Data Reviewed: CBC: Recent Labs  Lab 06/29/17 0536 06/30/17 0505 07/02/17 1131 07/03/17 0501  WBC  --  5.5 7.4 4.3  NEUTROABS  --   --  5.8  --   HGB 10.5* 10.8* 11.1* 10.6*  HCT 33.6* 34.1* 35.5* 33.9*  MCV  --  99.4 98.6 97.4  PLT  --  206 242 073   Basic Metabolic Panel: Recent Labs  Lab 06/29/17 0536 06/30/17 0505 07/02/17 1131 07/03/17 0501 07/04/17 0349  NA 136 135 135 134* 129*  K 4.0 4.0 4.4 4.5 4.7  CL 101 100* 99* 99* 99*  CO2 22 21* 17* 19* 19*  GLUCOSE 156* 135* 189* 241* 208*  BUN 95* 94* 109* 115* 128*  CREATININE 3.57* 3.42* 4.11* 4.14* 3.93*  CALCIUM 9.0 9.0 9.5 9.3 9.4  MG  --   --   --  3.3*  --    GFR: Estimated Creatinine Clearance: 13.7 mL/min (A) (by C-G formula based on SCr of 3.93 mg/dL (H)). Liver Function Tests: Recent Labs  Lab 07/02/17 1131  AST 36  ALT 160*  ALKPHOS 52  BILITOT 1.1  PROT 6.8  ALBUMIN 3.6   No results for input(s): LIPASE, AMYLASE in the last 168 hours. No results for input(s): AMMONIA in the last 168 hours. Coagulation Profile: Recent Labs  Lab 06/29/17 0536 06/30/17 0505 07/02/17 1354 07/03/17 0501 07/04/17 0349  INR 2.44 1.79 3.00 3.37 3.10   Cardiac Enzymes: No results for input(s): CKTOTAL, CKMB, CKMBINDEX, TROPONINI in the last 168 hours. BNP (last 3 results) No results for input(s): PROBNP in the last 8760 hours. HbA1C: Recent Labs    07/03/17 0501  HGBA1C 6.6*   CBG: Recent Labs  Lab 07/03/17 1134 07/03/17 1647 07/03/17 2102 07/04/17 0742 07/04/17 1258  GLUCAP 326* 336* 299* 220* 312*   Lipid Profile: No results for input(s): CHOL, HDL, LDLCALC, TRIG, CHOLHDL, LDLDIRECT in the last 72 hours. Thyroid Function Tests: No results for input(s): TSH,  T4TOTAL, FREET4, T3FREE, THYROIDAB in the last 72 hours. Anemia Panel: No results for input(s): VITAMINB12, FOLATE, FERRITIN, TIBC, IRON, RETICCTPCT in the last 72 hours. Urine analysis:    Component Value Date/Time   COLORURINE YELLOW 07/02/2017 1123   APPEARANCEUR HAZY (A) 07/02/2017 1123   LABSPEC 1.013 07/02/2017 1123   PHURINE 5.0 07/02/2017 1123   GLUCOSEU NEGATIVE 07/02/2017 1123   HGBUR LARGE (A) 07/02/2017 1123   BILIRUBINUR NEGATIVE 07/02/2017 1123   KETONESUR NEGATIVE 07/02/2017 1123   PROTEINUR NEGATIVE 07/02/2017 1123   UROBILINOGEN 0.2 01/08/2015 0940   NITRITE NEGATIVE 07/02/2017 1123   LEUKOCYTESUR NEGATIVE 07/02/2017 1123   Sepsis Labs: @LABRCNTIP (procalcitonin:4,lacticidven:4)  ) Recent Results (from the past 240 hour(s))  Urine culture     Status: None   Collection Time: 06/26/17  8:30 AM  Result Value Ref Range Status   Specimen Description URINE, CLEAN CATCH  Final   Special Requests NONE  Final   Culture   Final  NO GROWTH Performed at [facility], [place] [address] [place], [place] [address]    Report Status 06/27/2017 FINAL  Final  MRSA PCR Screening     Status: None   Collection Time: 06/26/17  5:46 PM  Result Value Ref Range Status   MRSA by PCR NEGATIVE NEGATIVE Final    Comment:        The GeneXpert MRSA Assay (FDA approved for NASAL specimens only), is one component of a comprehensive MRSA colonization surveillance program. It is not intended to diagnose MRSA infection nor to guide or monitor treatment for MRSA infections.       Studies: No results found.  Scheduled Meds: . amiodarone  100 mg Oral Daily  . aspirin EC  81 mg Oral Daily  . calcitRIOL  0.25 mcg Oral Daily  . carvedilol  6.25 mg Oral BID WC  . feeding supplement (ENSURE ENLIVE)  237 mL Oral TID BM  . ferrous sulfate  325 mg Oral BID  . guaiFENesin  600 mg Oral BID  . insulin aspart  0-20 Units Subcutaneous TID WC  . insulin aspart  0-5 Units Subcutaneous  QHS  . insulin aspart  6 Units Subcutaneous TID WC  . insulin glargine  14 Units Subcutaneous Daily  . ipratropium-albuterol  3 mL Nebulization BID  . isosorbide mononitrate  30 mg Oral Daily  . levothyroxine  50 mcg Oral QAC breakfast  . methylPREDNISolone (SOLU-MEDROL) injection  40 mg Intravenous Q6H  . multivitamin with minerals  1 tablet Oral Daily  . polyethylene glycol  17 g Oral Daily  . pravastatin  80 mg Oral QHS  . sodium chloride flush  3 mL Intravenous Q12H  . warfarin  0.5 mg Oral ONCE-1800  . Warfarin - Pharmacist Dosing Inpatient   Does not apply q1800    Continuous Infusions: . sodium chloride       LOS: 2 days     Alma Friendly, MD [facility]   If 7PM-7AM, please contact night-coverage [email or web address] Password [facility] 07/04/2017, 4:08 PM

## 2017-07-05 DIAGNOSIS — N179 Acute kidney failure, unspecified: Secondary | ICD-10-CM

## 2017-07-05 LAB — BASIC METABOLIC PANEL
Anion gap: 16 — ABNORMAL HIGH (ref 5–15)
BUN: 148 mg/dL — AB (ref 6–20)
CHLORIDE: 97 mmol/L — AB (ref 101–111)
CO2: 15 mmol/L — ABNORMAL LOW (ref 22–32)
Calcium: 9.5 mg/dL (ref 8.9–10.3)
Creatinine, Ser: 3.53 mg/dL — ABNORMAL HIGH (ref 0.44–1.00)
GFR calc Af Amer: 14 mL/min — ABNORMAL LOW (ref >60)
GFR calc non Af Amer: 12 mL/min — ABNORMAL LOW (ref >60)
Glucose, Bld: 292 mg/dL — ABNORMAL HIGH (ref 65–99)
POTASSIUM: 5.4 mmol/L — AB (ref 3.5–5.1)
Sodium: 128 mmol/L — ABNORMAL LOW (ref 135–145)

## 2017-07-05 LAB — GLUCOSE, CAPILLARY
GLUCOSE-CAPILLARY: 243 mg/dL — AB (ref 65–99)
GLUCOSE-CAPILLARY: 203 mg/dL — AB (ref 65–99)
Glucose-Capillary: 225 mg/dL — ABNORMAL HIGH (ref 65–99)
Glucose-Capillary: 288 mg/dL — ABNORMAL HIGH (ref 65–99)

## 2017-07-05 LAB — CBC
HEMATOCRIT: 35.6 % — AB (ref 36.0–46.0)
HEMOGLOBIN: 11.4 g/dL — AB (ref 12.0–15.0)
MCH: 31.3 pg (ref 26.0–34.0)
MCHC: 32 g/dL (ref 30.0–36.0)
MCV: 97.8 fL (ref 78.0–100.0)
Platelets: 203 10*3/uL (ref 150–400)
RBC: 3.64 MIL/uL — AB (ref 3.87–5.11)
RDW: 16.5 % — ABNORMAL HIGH (ref 11.5–15.5)
WBC: 8.8 10*3/uL (ref 4.0–10.5)

## 2017-07-05 LAB — PROTIME-INR
INR: 2.02
Prothrombin Time: 22.7 seconds — ABNORMAL HIGH (ref 11.4–15.2)

## 2017-07-05 MED ORDER — FUROSEMIDE 10 MG/ML IJ SOLN: 80.0000 mg | Freq: Two times a day (BID) | INTRAMUSCULAR | Status: DC

## 2017-07-05 MED ORDER — AMIODARONE HCL 200 MG PO TABS
200.0000 mg | ORAL_TABLET | Freq: Every day | ORAL | Status: DC
  Administered 2017-07-06 – 2017-07-15 (×9): 200 mg via ORAL
  Filled 2017-07-05 (×10): qty 1

## 2017-07-05 MED ORDER — WARFARIN SODIUM 2.5 MG PO TABS
2.5000 mg | ORAL_TABLET | Freq: Once | ORAL | Status: AC
  Administered 2017-07-05: 2.5 mg via ORAL
  Filled 2017-07-05: qty 1

## 2017-07-05 MED ORDER — FUROSEMIDE 10 MG/ML IJ SOLN
120.0000 mg | Freq: Two times a day (BID) | INTRAVENOUS | Status: DC
  Administered 2017-07-05 – 2017-07-06 (×3): 120 mg via INTRAVENOUS
  Filled 2017-07-05: qty 2
  Filled 2017-07-05: qty 12
  Filled 2017-07-05: qty 10
  Filled 2017-07-05: qty 12

## 2017-07-05 NOTE — Progress Notes (Signed)
Patient ID: [name], female   DOB: February 07, 1946, 72 y.o.   MRN: [id number]     Advanced Heart Failure Rounding Note  Primary Cardiologist: Aundra Dubin  Subjective:    Patient comfortable at rest, not confused.  Minimal UOP.  SBP 90s-100s.  She remains in atrial fibrillation.    Objective:   Weight Range: 188 lb 11.4 oz (85.6 kg) Body mass index is 31.4 kg/m.   Vital Signs:   Temp:  [98 F (36.7 C)-98.1 F (36.7 C)] 98.1 F (36.7 C) (01/13 1150) Pulse Rate:  [72-104] 104 (01/13 1150) Resp:  [18] 18 (01/13 1150) BP: (97-108)/(60-63) 97/63 (01/13 1150) SpO2:  [97 %-98 %] 98 % (01/13 1150) Weight:  [188 lb 11.4 oz (85.6 kg)] 188 lb 11.4 oz (85.6 kg) (01/13 0429) Last BM Date: 07/02/17  Weight change: Filed Weights   07/03/17 0459 07/04/17 0535 07/05/17 0429  Weight: 191 lb 5.8 oz (86.8 kg) 186 lb 14.4 oz (84.8 kg) 188 lb 11.4 oz (85.6 kg)    Intake/Output:   Intake/Output Summary (Last 24 hours) at 07/05/2017 1347 Last data filed at 07/05/2017 0840 Gross per 24 hour  Intake 443 ml  Output -  Net 443 ml      Physical Exam    General:  Chronically ill-appearing HEENT: Normal Neck: Supple. JVP 12 cm. Carotids 2+ bilat; no bruits. No lymphadenopathy or thyromegaly appreciated. Cor: PMI lateral. Irregular rate & rhythm. No rubs, gallops.  2/6 SEM RUSB.  Lungs: Crackles at bases bilaterally.  Abdomen: Soft, nontender, nondistended. No hepatosplenomegaly. No bruits or masses. Good bowel sounds. Extremities: No cyanosis, clubbing, rash, edema Neuro: Alert & orientedx3, cranial nerves grossly intact. moves all 4 extremities w/o difficulty. Affect pleasant   Telemetry   Atrial fibrillation in 90s (personally reviewed)  EKG    Atrial fibrillation with wide LBBB  Labs    CBC Recent Labs    07/03/17 0501 07/05/17 0844  WBC 4.3 8.8  HGB 10.6* 11.4*  HCT 33.9* 35.6*  MCV 97.4 97.8  PLT 207 270   Basic Metabolic Panel Recent Labs    07/03/17 0501  07/04/17 0349 07/05/17 0844  NA 134* 129* 128*  K 4.5 4.7 5.4*  CL 99* 99* 97*  CO2 19* 19* 15*  GLUCOSE 241* 208* 292*  BUN 115* 128* 148*  CREATININE 4.14* 3.93* 3.53*  CALCIUM 9.3 9.4 9.5  MG 3.3*  --   --    Liver Function Tests No results for input(s): AST, ALT, ALKPHOS, BILITOT, PROT, ALBUMIN in the last 72 hours. No results for input(s): LIPASE, AMYLASE in the last 72 hours. Cardiac Enzymes No results for input(s): CKTOTAL, CKMB, CKMBINDEX, TROPONINI in the last 72 hours.  BNP: BNP (last 3 results) Recent Labs    06/26/17 0806 07/02/17 1131 07/03/17 0501  BNP 2,226.0* 1,605.1* 2,179.2*    ProBNP (last 3 results) No results for input(s): PROBNP in the last 8760 hours.   D-Dimer No results for input(s): DDIMER in the last 72 hours. Hemoglobin A1C Recent Labs    07/03/17 0501  HGBA1C 6.6*   Fasting Lipid Panel No results for input(s): CHOL, HDL, LDLCALC, TRIG, CHOLHDL, LDLDIRECT in the last 72 hours. Thyroid Function Tests No results for input(s): TSH, T4TOTAL, T3FREE, THYROIDAB in the last 72 hours.  Invalid input(s): FREET3  Other results:   Imaging     No results found.   Medications:     Scheduled Medications: . amiodarone  100 mg Oral Daily  . calcitRIOL  0.25 mcg Oral Daily  . carvedilol  6.25 mg Oral BID WC  . feeding supplement (ENSURE ENLIVE)  237 mL Oral TID BM  . ferrous sulfate  325 mg Oral BID  . furosemide  80 mg Intravenous BID  . guaiFENesin  600 mg Oral BID  . insulin aspart  0-20 Units Subcutaneous TID WC  . insulin aspart  0-5 Units Subcutaneous QHS  . insulin aspart  6 Units Subcutaneous TID WC  . insulin glargine  14 Units Subcutaneous Daily  . ipratropium-albuterol  3 mL Nebulization BID  . isosorbide mononitrate  30 mg Oral Daily  . levothyroxine  50 mcg Oral QAC breakfast  . methylPREDNISolone (SOLU-MEDROL) injection  40 mg Intravenous Q6H  . multivitamin with minerals  1 tablet Oral Daily  . polyethylene glycol   17 g Oral Daily  . pravastatin  80 mg Oral QHS  . sodium chloride flush  3 mL Intravenous Q12H  . warfarin  2.5 mg Oral ONCE-1800  . Warfarin - Pharmacist Dosing Inpatient   Does not apply q1800     Infusions: . sodium chloride       PRN Medications:  sodium chloride, acetaminophen **OR** acetaminophen, albuterol, ondansetron **OR** ondansetron (ZOFRAN) IV, oxyCODONE-acetaminophen **AND** oxyCODONE, senna-docusate, sodium chloride flush    Patient Profile   72 yo with history of chronic systolic CHF from nonischemic cardiomyopathy, prior PE, CKD IV-V, and paroxysmal atrial fibrillation presented with dyspnea and progressive renal dysfunction.   Assessment/Plan   1. Acute on chronic systolic CHF: Echo (5/80) with EF 20-25%, nonischemic cardiomyopathy.  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  On exam today, she is volume overloaded though she is comfortable at rest.  Suspect this is potentiated by progressive renal failure.  I suspect the majority of her dyspnea is due to CHF, COPD probably plays a lesser role.  - I will start her on Lasix 120 mg IV bid to see if she has response to this.  - BP too soft to add other meds.  Can continue current Coreg for now.   -  I think that she is going to require HD for volume management. Has AVF, nephrology following and it appears that the plan will be HD tomorrow.  2. 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  Slowly progressive.  Unable to clear volume.  I think that she is going to require HD long-term.  Tentative plan for HD tomorrow.  3. Atrial fibrillation: Paroxysmal.  Was in NSR in 9/18 but has been in atrial fibrillation since admitted this time.  Once her volume status is better controlled, would try to get her back in NSR (likely later this week after HD).  She has been on amiodarone at home.  - Increase amiodarone to 200 mg daily.  - Will need to look back through INRs to see if she  would need a TEE or just DCCV.  4. OSA: Uses CPAP at home, provide here.  5. COPD: On Solumedrol per primary team.  Suspect CHF is main issue.  6. Deconditioning: PT.   Length of Stay: 3  Loralie Champagne, MD  07/05/2017, 1:47 PM  Advanced Heart Failure Team Pager [phone] (M-F; [place])  Please contact [facility] for night-coverage after hours (4p -7a ) and weekends on [email or web address]

## 2017-07-05 NOTE — Progress Notes (Signed)
PROGRESS NOTE  [name]:[id number] DOB: 11/03/1945 DOA: 07/02/2017 PCP: Lemmie Evens, MD  HPI/Recap of past 24 hours: HPI from Dr Randa Spike on 07/02/17  [name] is a 72 y.o. female with medical history significant of NICM with EF of 20% s/p ICD, A. 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 Since discharge from the hospital she has done poorly according to her son and her husband.  She presented to our ER with worsening shortness of breath and generalized fatigue. In the ED, she was given a nebulizer treatment and seemed to improve some with a dose of steroid. Pt admitted for further management.  Today, patient denied any new complaints, appeared comfortable.  Denies any chest pain, worsening shortness of breath, nausea/vomiting.  Assessment/Plan: Principal Problem:   COPD (chronic obstructive pulmonary disease) (HCC) Active Problems:   Dyslipidemia   HTN (hypertension)   History of pulmonary embolism   Type 2 diabetes, uncontrolled, with renal manifestation (HCC)   Chronic renal disease, stage 4, severely decreased glomerular filtration rate (GFR) between 15-29 mL/min/1.73 square meter (HCC)   Chronic anticoagulation   Atrial fibrillation (HCC)   Implantable cardioverter-defibrillator-CRT- Mdt   Anemia in chronic renal disease   Hypothyroidism   SOB (shortness of breath)   Acute on chronic systolic CHF (congestive heart failure), NYHA class 4 ([place])   Palliative care by specialist   Goals of care, counseling/discussion   Generalized weakness  Acute on chronic systolic HF New York Heart Association class IV Decompensated BNP 2179, CXR as above ECHO EF 20-25%, s/p ICD X 2, removed due to infection    Cardiology on board: Started lasix 120mg  BID, continue coreg, imdur Hold lisinopril Strict I&O, daily weight  Acute on CKD stage IV Vs ESRD Likely Uremic Cr elevated to 4, baseline around 3.5 Held lisinopril, avoid nephrotoxins and monitor closely Nephrology consulted: May start HD 07/05/17 once AVF is ready to use Vs TDC  COPD exacerbation SOB likely multifactorial due to CHF, uremia No cough, afebrile, no leukocytosis CXR: Mild cardiomegaly, no vas congestion Continue duonebs, discontinue solumedrol PRN O2   Chronic Afib Rate controlled, still in Afib Continue coumadin, coreg, amiodarone  Type 2 diabetes A1c 6.6 SSI, lantus, TID aspart Hypoglycemic protocol  Anemia of chronic renal disease Continue PO iron  Dyslipidemia Continue pravastatin  Hypertension BP soft, held lisinopril Continue coreg, imdur  Hypothyroidism Continue levothyroxine  Goals of care discussion Palliative consulted, husband wants full code and everything done including dialysis   Code Status:  Full  Family Communication: None at bedside  Disposition Plan: Recommend SNF, pt and family refusing   Consultants:  Cardiology  Nephrology  Palliative  Procedures:  None  Antimicrobials:  None  DVT prophylaxis:  Coumadin   Objective: Vitals:   07/04/17 2028 07/05/17 0429 07/05/17 1041 07/05/17 1150  BP:  108/60  97/63  Pulse:  72  (!) 104  Resp:  18  18  Temp:  98 F (36.7 C)  98.1 F (36.7 C)  TempSrc:  Oral    SpO2: 97% 98% 97% 98%  Weight:  85.6 kg (188 lb 11.4 oz)    Height:        Intake/Output Summary (Last 24 hours) at 07/05/2017 1548 Last data filed at 07/05/2017 1527  Gross per 24 hour  Intake 923 ml  Output 300 ml  Net 623 ml   Filed Weights   07/03/17 0459 07/04/17 0535 07/05/17 0429  Weight: 86.8 kg (191 lb 5.8 oz) 84.8 kg (186 lb 14.4 oz) 85.6 kg (188 lb 11.4 oz)    Exam:   General: Alert, awake, NAD  Cardiovascular: Irregular rate and  rhythm, SEM  Respiratory: Bibasilar crackles  Abdomen:  Soft, non-tender, non-distended, BS present  Musculoskeletal: +1 bilateral pedal edema  Skin: Normal  Psychiatry: Normal mood   Data Reviewed: CBC: Recent Labs  Lab 06/29/17 0536 06/30/17 0505 07/02/17 1131 07/03/17 0501 07/05/17 0844  WBC  --  5.5 7.4 4.3 8.8  NEUTROABS  --   --  5.8  --   --   HGB 10.5* 10.8* 11.1* 10.6* 11.4*  HCT 33.6* 34.1* 35.5* 33.9* 35.6*  MCV  --  99.4 98.6 97.4 97.8  PLT  --  206 242 207 854   Basic Metabolic Panel: Recent Labs  Lab 06/30/17 0505 07/02/17 1131 07/03/17 0501 07/04/17 0349 07/05/17 0844  NA 135 135 134* 129* 128*  K 4.0 4.4 4.5 4.7 5.4*  CL 100* 99* 99* 99* 97*  CO2 21* 17* 19* 19* 15*  GLUCOSE 135* 189* 241* 208* 292*  BUN 94* 109* 115* 128* 148*  CREATININE 3.42* 4.11* 4.14* 3.93* 3.53*  CALCIUM 9.0 9.5 9.3 9.4 9.5  MG  --   --  3.3*  --   --    GFR: Estimated Creatinine Clearance: 15.3 mL/min (A) (by C-G formula based on SCr of 3.53 mg/dL (H)). Liver Function Tests: Recent Labs  Lab 07/02/17 1131  AST 36  ALT 160*  ALKPHOS 52  BILITOT 1.1  PROT 6.8  ALBUMIN 3.6   No results for input(s): LIPASE, AMYLASE in the last 168 hours. No results for input(s): AMMONIA in the last 168 hours. Coagulation Profile: Recent Labs  Lab 06/30/17 0505 07/02/17 1354 07/03/17 0501 07/04/17 0349 07/05/17 0844  INR 1.79 3.00 3.37 3.10 2.02   Cardiac Enzymes: No results for input(s): CKTOTAL, CKMB, CKMBINDEX, TROPONINI in the last 168 hours. BNP (last 3 results) No results for input(s): PROBNP in the last 8760 hours. HbA1C: Recent Labs    07/03/17 0501  HGBA1C 6.6*   CBG: Recent Labs  Lab 07/04/17 1258 07/04/17 1640 07/04/17 2053 07/05/17 0721 07/05/17 1256  GLUCAP 312* 322* 286* 225* 288*   Lipid Profile: No results for input(s): CHOL, HDL, LDLCALC, TRIG, CHOLHDL, LDLDIRECT in the last 72 hours. Thyroid Function Tests: No results for input(s):  TSH, T4TOTAL, FREET4, T3FREE, THYROIDAB in the last 72 hours. Anemia Panel: No results for input(s): VITAMINB12, FOLATE, FERRITIN, TIBC, IRON, RETICCTPCT in the last 72 hours. Urine analysis:    Component Value Date/Time   COLORURINE YELLOW 07/02/2017 1123   APPEARANCEUR HAZY (A) 07/02/2017 1123   LABSPEC 1.013 07/02/2017 1123   PHURINE 5.0 07/02/2017 1123   GLUCOSEU NEGATIVE 07/02/2017 1123   HGBUR LARGE (A) 07/02/2017 1123   BILIRUBINUR NEGATIVE 07/02/2017 1123   KETONESUR NEGATIVE 07/02/2017 1123   PROTEINUR NEGATIVE 07/02/2017 1123   UROBILINOGEN 0.2 01/08/2015 0940   NITRITE NEGATIVE 07/02/2017 1123   LEUKOCYTESUR NEGATIVE 07/02/2017 1123   Sepsis Labs: @LABRCNTIP (procalcitonin:4,lacticidven:4)  ) Recent Results (from the past 240 hour(s))  Urine culture     Status: None   Collection Time: 06/26/17  8:30 AM  Result Value Ref Range Status   Specimen Description URINE, CLEAN CATCH  Final   Special Requests  NONE  Final   Culture   Final    NO GROWTH Performed at [facility], [place] [address], [place], [place] [address]    Report Status 06/27/2017 FINAL  Final  MRSA PCR Screening     Status: None   Collection Time: 06/26/17  5:46 PM  Result Value Ref Range Status   MRSA by PCR NEGATIVE NEGATIVE Final    Comment:        The GeneXpert MRSA Assay (FDA approved for NASAL specimens only), is one component of a comprehensive MRSA colonization surveillance program. It is not intended to diagnose MRSA infection nor to guide or monitor treatment for MRSA infections.       Studies: No results found.  Scheduled Meds: . [START ON 07/06/2017] amiodarone  200 mg Oral Daily  . calcitRIOL  0.25 mcg Oral Daily  . carvedilol  6.25 mg Oral BID WC  . feeding supplement (ENSURE ENLIVE)  237 mL Oral TID BM  . ferrous sulfate  325 mg Oral BID  . insulin aspart  0-20 Units Subcutaneous TID WC  . insulin aspart  0-5 Units Subcutaneous QHS  . insulin aspart  6 Units  Subcutaneous TID WC  . insulin glargine  14 Units Subcutaneous Daily  . ipratropium-albuterol  3 mL Nebulization BID  . isosorbide mononitrate  30 mg Oral Daily  . levothyroxine  50 mcg Oral QAC breakfast  . multivitamin with minerals  1 tablet Oral Daily  . polyethylene glycol  17 g Oral Daily  . pravastatin  80 mg Oral QHS  . sodium chloride flush  3 mL Intravenous Q12H  . warfarin  2.5 mg Oral ONCE-1800  . Warfarin - Pharmacist Dosing Inpatient   Does not apply q1800    Continuous Infusions: . sodium chloride    . furosemide 120 mg (07/05/17 1527)     LOS: 3 days     Alma Friendly, MD [facility]   If 7PM-7AM, please contact night-coverage [email or web address] Password [facility] 07/05/2017, 3:48 PM

## 2017-07-05 NOTE — Progress Notes (Addendum)
[facility] Progress Note  Subjective: no new c/o  Vitals:   07/04/17 2028 07/05/17 0429 07/05/17 1041 07/05/17 1150  BP:  108/60  97/63  Pulse:  72  (!) 104  Resp:  18  18  Temp:  98 F (36.7 C)  98.1 F (36.7 C)  TempSrc:  Oral    SpO2: 97% 98% 97% 98%  Weight:  85.6 kg (188 lb 11.4 oz)    Height:        Inpatient medications: . amiodarone  100 mg Oral Daily  . aspirin EC  81 mg Oral Daily  . calcitRIOL  0.25 mcg Oral Daily  . carvedilol  6.25 mg Oral BID WC  . feeding supplement (ENSURE ENLIVE)  237 mL Oral TID BM  . ferrous sulfate  325 mg Oral BID  . guaiFENesin  600 mg Oral BID  . insulin aspart  0-20 Units Subcutaneous TID WC  . insulin aspart  0-5 Units Subcutaneous QHS  . insulin aspart  6 Units Subcutaneous TID WC  . insulin glargine  14 Units Subcutaneous Daily  . ipratropium-albuterol  3 mL Nebulization BID  . isosorbide mononitrate  30 mg Oral Daily  . levothyroxine  50 mcg Oral QAC breakfast  . methylPREDNISolone (SOLU-MEDROL) injection  40 mg Intravenous Q6H  . multivitamin with minerals  1 tablet Oral Daily  . polyethylene glycol  17 g Oral Daily  . pravastatin  80 mg Oral QHS  . sodium chloride flush  3 mL Intravenous Q12H  . warfarin  2.5 mg Oral ONCE-1800  . Warfarin - Pharmacist Dosing Inpatient   Does not apply q1800   . sodium chloride     sodium chloride, acetaminophen **OR** acetaminophen, albuterol, ondansetron **OR** ondansetron (ZOFRAN) IV, oxyCODONE-acetaminophen **AND** oxyCODONE, senna-docusate, sodium chloride flush  Exam: Gen alert, no distress, elderly WF no distress No rash, cyanosis or gangrene Sclera anicteric, throat clear   No jvd or bruits Chest clear bilat RRR +summation gallop, 2/6 SEM Abd soft ntnd no mass or ascites +bs obese GU defer MS no joint effusions or deformity Ext diffuse 2+ LE edema , 1+ around the hips / no wounds or ulcers Neuro is alert, Ox 3, nonfocal , very mild asterixis RUA AVF +bruit,  looks well developed   Inpt meds > amiodarone/ asa/ rocaltrol/ coreg 6.25 bid/ fe sulfate/ novolog/ lantus/ duoneb/ imdur/ mvi/ solumedrol IV/ statin/ coumadin   Na 129  K 4.7  BUN 128  Cr 3.93  CO2 19   Hb 10.6  plt 207 INR 3.10    BNP 2179  1st stage BVT - 03/05/17 (Dr Trula Slade) 2nd stage BVT - 05/28/17 (Dr Trula Slade)  Date                Cr     BUN             eGFR 2016                1.6- 2.5              18 - 32 2017                2.5- 2.9              15 - 07 Feb 2017         2.95 Jun 26 2017      3.01                  81  15         Jun 30 2017      3.42                  94  13 Jul 04 2017    3.93                  128  12 Jul 05 2017 3.53  148  CXR 1/101/19 > no active disease  Impression: 1. CKD stage IV w AKI vs progression: borderline uremic symptoms and sig chronic comorbidities, doubt that patient can go much longer safely w/o dialysis. Will plan initiation of HD this admission. Explained and discussed w pt and husband. Have d/w VVS, Dr Donzetta Matters said AVF is ok to use, HD orders written for tomorrow.  2. Severe NICM - hx BiV pacer in past, both were removed d/t infection.  3. Vol overload - cxr clear, LE edema 4. COPD exacerbation - improved, on IV steroids 5. DM2  6. Chronic afib - on amio, coumadin and low dose coreg 7. Anemia of CKD - Hb ok 10.6 8. Hypotension - BP's borderline, 90's - low 100's     Plan - as above   Kelly Splinter MD [facility] pager [phone]   07/05/2017, 1:31 PM   Recent Labs  Lab 07/03/17 0501 07/04/17 0349 07/05/17 0844  NA 134* 129* 128*  K 4.5 4.7 5.4*  CL 99* 99* 97*  CO2 19* 19* 15*  GLUCOSE 241* 208* 292*  BUN 115* 128* 148*  CREATININE 4.14* 3.93* 3.53*  CALCIUM 9.3 9.4 9.5   Recent Labs  Lab 07/02/17 1131  AST 36  ALT 160*  ALKPHOS 52  BILITOT 1.1  PROT 6.8  ALBUMIN 3.6   Recent Labs  Lab 07/02/17 1131 07/03/17 0501 07/05/17 0844  WBC 7.4 4.3 8.8  NEUTROABS 5.8  --   --   HGB 11.1* 10.6*  11.4*  HCT 35.5* 33.9* 35.6*  MCV 98.6 97.4 97.8  PLT 242 207 203   Iron/TIBC/Ferritin/ %Sat    Component Value Date/Time   IRON 84 02/06/2017 0837   TIBC 244 (L) 02/06/2017 0837   FERRITIN 230 02/06/2017 0837   IRONPCTSAT 34 (H) 02/06/2017 0837   IRONPCTSAT 17 (L) 07/22/2011 1011

## 2017-07-05 NOTE — Progress Notes (Signed)
ANTICOAGULATION CONSULT NOTE - Follow Up Consult  Pharmacy Consult for warfarin Indication: atrial fibrillation and VTE prophylaxis  No Known Allergies  Patient Measurements: Height: 5\' 5"  (165.1 cm) Weight: 188 lb 11.4 oz (85.6 kg) IBW/kg (Calculated) : 57   Vital Signs: Temp: 98.1 F (36.7 C) (01/13 1150) Temp Source: Oral (01/13 0429) BP: 97/63 (01/13 1150) Pulse Rate: 104 (01/13 1150)  Labs: Recent Labs    07/03/17 0501 07/04/17 0349 07/05/17 0844  HGB 10.6*  --  11.4*  HCT 33.9*  --  35.6*  PLT 207  --  203  LABPROT 33.9* 31.7* 22.7*  INR 3.37 3.10 2.02  CREATININE 4.14* 3.93* 3.53*    Estimated Creatinine Clearance: 15.3 mL/min (A) (by C-G formula based on SCr of 3.53 mg/dL (H)).   Medical History: Past Medical History:  Diagnosis Date  . Anemia    a. mild/chronic  . Anemia due to GI blood loss 02/26/2016  . Anemia in chronic renal disease 11/30/2014  . Cardiomyopathy, nonischemic ([place])    a. 1999 nl cath;  b. 12/05 [facility] [place];  c. 10/2005 ICD extraction 2/2 enterococcus bacteremia and Veg on RV lead;  c. 05/2008 low risk Myoview (scarring w/ some evidence of inf ischemia);  d. 11/2012 Echo: EF 15-20%;  e. 01/2013 s/p MDT [place] CRT D, ser # [id number];  f. 05/2013 Echo: EF 15%.  . Chronic systolic CHF (congestive heart failure) ([place])    a. 11/2012 Echo: EF 15-20%;  b. 05/2013 TEE EF 15%.  . CKD (chronic kidney disease), stage III ([place])    creatinin-1.44 in 1/09; 1.51 in 1/10  . Degenerative joint disease    of knees, shoulder, and hips  . Enterococcal infection    a. 10/2005 - AICD-explanted  . GERD (gastroesophageal reflux disease)   . Hilar density    a. infrahilar mass/adenopathy on CT scan 5/07; subsequently  resolved  . History of blood transfusion   . Hyperlipidemia   . Hypertension   . Hypothyroidism   . ICD (implantable cardioverter-defibrillator) infection ([place])    removed 2016  . Implantable cardioverter-defibrillator-CRT- Mdt    a.  01/2013  s/p MDT [place] CRT D, ser # [id number] - ICD has been removed  . LBBB (left bundle branch block)   . Obstructive sleep apnea    uses cpap  . PAF (paroxysmal atrial fibrillation) ([place])    a. 07/2012 s/p TEE/DCCV;  b. chronic coumadin;  c. 05/2013 Recurrent Afib->TEE/DCCV and amio initiation.  . Peripheral vascular disease ([place])   . Pneumonia 07/2005  . Pulmonary embolism ([place])    a. 07/2005 after total right hip arthroplasty  . Tobacco abuse    a. discontinued in 1997, and then resumed  . Type II diabetes mellitus ([place])    type 2  . Urinary incontinence   . Villous adenoma of colon    a. tubovillous adenomatous polyp with focal high grade dysplasia; presented with hematochezia - followed by Dr. Laural Golden.    Medications:  Scheduled:  . amiodarone  100 mg Oral Daily  . aspirin EC  81 mg Oral Daily  . calcitRIOL  0.25 mcg Oral Daily  . carvedilol  6.25 mg Oral BID WC  . feeding supplement (ENSURE ENLIVE)  237 mL Oral TID BM  . ferrous sulfate  325 mg Oral BID  . guaiFENesin  600 mg Oral BID  . insulin aspart  0-20 Units Subcutaneous TID WC  . insulin aspart  0-5 Units Subcutaneous QHS  . insulin  aspart  6 Units Subcutaneous TID WC  . insulin glargine  14 Units Subcutaneous Daily  . ipratropium-albuterol  3 mL Nebulization BID  . isosorbide mononitrate  30 mg Oral Daily  . levothyroxine  50 mcg Oral QAC breakfast  . methylPREDNISolone (SOLU-MEDROL) injection  40 mg Intravenous Q6H  . multivitamin with minerals  1 tablet Oral Daily  . polyethylene glycol  17 g Oral Daily  . pravastatin  80 mg Oral QHS  . sodium chloride flush  3 mL Intravenous Q12H  . warfarin  2.5 mg Oral ONCE-1800  . Warfarin - Pharmacist Dosing Inpatient   Does not apply q1800    Assessment: 72 y.o female presented with SOB and recent admit on 1/4 for CHF exacerbation.  On warfarin for afib prior to admission with therapeutic INR 3 upon admission.   Home regimen: 1.25 mg on MWF and 2.5 mg all other days  (weekly dose 13.75mg ).  INR trended down from 3.1 to 2.02 after holding dose 1/11 evening. Last dose was 2.5 mg on 1/11 at 0300. Hgb is stable at 11.4 and platelets WNL. No signs/symptoms of bleeding. On concurrent methylpredisolone for COPD exacerbation, which can impact warfarin sensitivity.   Goal of Therapy:  INR 2-3 Monitor platelets by anticoagulation protocol: Yes   Plan:  Warfarin 2.5 mg x1 tonight to attempt to keep within goal range Monitor daily INR, CBC, signs/symptoms of bleeding  Thank you for allowing [place] to participate in this patients care.  Doylene Canard, PharmD Clinical Pharmacist  Pager: [phone] Clinical Phone for 07/05/2017 until 3:30pm: [phone] If after 3:30pm, please call main pharmacy at [phone] 07/05/2017 1:17 PM

## 2017-07-05 NOTE — Progress Notes (Signed)
   avf should be ok for use if needed but given mild pulsatility I have ordered a duplex to evaluate.   Jannat Rosemeyer C. Donzetta Matters, MD [facility] Office: [phone] Pager: [phone]

## 2017-07-06 ENCOUNTER — Ambulatory Visit (HOSPITAL_COMMUNITY): Payer: Medicare HMO

## 2017-07-06 ENCOUNTER — Other Ambulatory Visit (HOSPITAL_COMMUNITY): Payer: Medicare HMO

## 2017-07-06 ENCOUNTER — Inpatient Hospital Stay (HOSPITAL_COMMUNITY): Payer: Medicare HMO

## 2017-07-06 LAB — CBC
HCT: 34.7 % — ABNORMAL LOW (ref 36.0–46.0)
HEMOGLOBIN: 11.3 g/dL — AB (ref 12.0–15.0)
MCH: 31.2 pg (ref 26.0–34.0)
MCHC: 32.6 g/dL (ref 30.0–36.0)
MCV: 95.9 fL (ref 78.0–100.0)
Platelets: 217 10*3/uL (ref 150–400)
RBC: 3.62 MIL/uL — AB (ref 3.87–5.11)
RDW: 16.4 % — ABNORMAL HIGH (ref 11.5–15.5)
WBC: 11.6 10*3/uL — ABNORMAL HIGH (ref 4.0–10.5)

## 2017-07-06 LAB — PROTIME-INR
INR: 1.94
PROTHROMBIN TIME: 22 s — AB (ref 11.4–15.2)

## 2017-07-06 LAB — BASIC METABOLIC PANEL
Anion gap: 14 (ref 5–15)
BUN: 158 mg/dL — AB (ref 6–20)
CO2: 18 mmol/L — ABNORMAL LOW (ref 22–32)
CREATININE: 3.38 mg/dL — AB (ref 0.44–1.00)
Calcium: 9.9 mg/dL (ref 8.9–10.3)
Chloride: 98 mmol/L — ABNORMAL LOW (ref 101–111)
GFR calc Af Amer: 14 mL/min — ABNORMAL LOW (ref >60)
GFR, EST NON AFRICAN AMERICAN: 13 mL/min — AB (ref >60)
Glucose, Bld: 156 mg/dL — ABNORMAL HIGH (ref 65–99)
POTASSIUM: 5 mmol/L (ref 3.5–5.1)
SODIUM: 130 mmol/L — AB (ref 135–145)

## 2017-07-06 LAB — GLUCOSE, CAPILLARY
GLUCOSE-CAPILLARY: 202 mg/dL — AB (ref 65–99)
Glucose-Capillary: 174 mg/dL — ABNORMAL HIGH (ref 65–99)
Glucose-Capillary: 151 mg/dL — ABNORMAL HIGH (ref 65–99)

## 2017-07-06 LAB — HEPATITIS B SURFACE ANTIGEN: HEP B S AG: NEGATIVE

## 2017-07-06 LAB — ALT: ALT: 61 U/L — ABNORMAL HIGH (ref 14–54)

## 2017-07-06 MED ORDER — ALTEPLASE 2 MG IJ SOLR: 2.0000 mg | Freq: Once | INTRAMUSCULAR | Status: DC | PRN

## 2017-07-06 MED ORDER — HEPARIN SODIUM (PORCINE) 1000 UNIT/ML DIALYSIS: 1000.0000 [IU] | INTRAMUSCULAR | Status: DC | PRN

## 2017-07-06 MED ORDER — LIDOCAINE HCL (PF) 1 % IJ SOLN: 5.0000 mL | INTRAMUSCULAR | Status: DC | PRN

## 2017-07-06 MED ORDER — GUAIFENESIN-DM 100-10 MG/5ML PO SYRP
5.0000 mL | ORAL_SOLUTION | ORAL | Status: DC | PRN
  Administered 2017-07-06 – 2017-07-08 (×2): 5 mL via ORAL
  Filled 2017-07-06 (×2): qty 5

## 2017-07-06 MED ORDER — SODIUM CHLORIDE 0.9 % IV SOLN: 100.0000 mL | INTRAVENOUS | Status: DC | PRN

## 2017-07-06 MED ORDER — HEPARIN SODIUM (PORCINE) 1000 UNIT/ML DIALYSIS: 2000.0000 [IU] | INTRAMUSCULAR | Status: DC | PRN

## 2017-07-06 MED ORDER — PENTAFLUOROPROP-TETRAFLUOROETH EX AERO: 1.0000 "application " | INHALATION_SPRAY | CUTANEOUS | Status: DC | PRN

## 2017-07-06 MED ORDER — WARFARIN SODIUM 2.5 MG PO TABS
2.5000 mg | ORAL_TABLET | Freq: Once | ORAL | Status: AC
  Administered 2017-07-06: 2.5 mg via ORAL
  Filled 2017-07-06 (×2): qty 1

## 2017-07-06 MED ORDER — LIDOCAINE-PRILOCAINE 2.5-2.5 % EX CREA: 1.0000 "application " | TOPICAL_CREAM | CUTANEOUS | Status: DC | PRN

## 2017-07-06 MED ORDER — INSULIN ASPART 100 UNIT/ML ~~LOC~~ SOLN
9.0000 [IU] | Freq: Three times a day (TID) | SUBCUTANEOUS | Status: DC
  Administered 2017-07-07 – 2017-07-08 (×3): 9 [IU] via SUBCUTANEOUS

## 2017-07-06 NOTE — Care Management Important Message (Signed)
Important Message  Patient Details  Name: [name] MRN: [id number] Date of Birth: 05-03-1946   Medicare Important Message Given:  Yes    Orbie Pyo 07/06/2017, 3:52 PM

## 2017-07-06 NOTE — Progress Notes (Signed)
Inpatient Rehabilitation  Per OT request, patient was screened by Maisey Deandrade for appropriateness for an Inpatient Acute Rehab consult.  At this time we are recommending an Inpatient Rehab consult.  Text paged MD to notify; please order if you are agreeable.    Faatimah Spielberg, M.A., CCC/SLP Admission Coordinator  [facility]  Cell [phone]  

## 2017-07-06 NOTE — Progress Notes (Signed)
Patient ID: [name], female   DOB: 06-Jun-1946, 72 y.o.   MRN: [id number]     Advanced Heart Failure Rounding Note  Primary Cardiologist: Aundra Dubin  Subjective:    Yesterday diuresed with 120 mg IV lasix twice a day.   Mild dyspnea at rest. Denies CP.   Objective:   Weight Range: 195 lb 12.3 oz (88.8 kg) Body mass index is 32.58 kg/m.   Vital Signs:   Temp:  [97.6 F (36.4 C)-98.1 F (36.7 C)] 97.6 F (36.4 C) (01/14 0428) Pulse Rate:  [67-104] 67 (01/14 0428) Resp:  [16-18] 16 (01/14 0428) BP: (92-109)/(56-63) 100/59 (01/14 0428) SpO2:  [97 %-98 %] 98 % (01/14 0428) Weight:  [195 lb 12.3 oz (88.8 kg)] 195 lb 12.3 oz (88.8 kg) (01/14 0600) Last BM Date: 07/02/17  Weight change: Filed Weights   07/04/17 0535 07/05/17 0429 07/06/17 0600  Weight: 186 lb 14.4 oz (84.8 kg) 188 lb 11.4 oz (85.6 kg) 195 lb 12.3 oz (88.8 kg)    Intake/Output:   Intake/Output Summary (Last 24 hours) at 07/06/2017 0811 Last data filed at 07/06/2017 0600 Gross per 24 hour  Intake 1102 ml  Output 1425 ml  Net -323 ml      Physical Exam    General:  Chronically ill. No resp difficulty. In bed.  HEENT: normal Neck: supple. ~JVP 10 . Carotids 2+ bilat; no bruits. No lymphadenopathy or thryomegaly appreciated. Cor: PMI nondisplaced. Irregular rate & rhythm. No rubs, gallops. 2/6 SEM RUSB Lungs: Decreased on room air.  Abdomen: soft, nontender, nondistended. No hepatosplenomegaly. No bruits or masses. Good bowel sounds. Extremities: no cyanosis, clubbing, rash, R and LLE 1+ edema RUE AVF Neuro: alert & orientedx3, cranial nerves grossly intact. moves all 4 extremities w/o difficulty. Affect pleasant    Telemetry    A fib 100s personally reviewed.   EKG    Atrial fibrillation with wide LBBB  Labs    CBC Recent Labs    07/05/17 0844  WBC 8.8  HGB 11.4*  HCT 35.6*  MCV 97.8  PLT 696   Basic Metabolic Panel Recent Labs    07/05/17 0844 07/06/17 0624  NA 128* 130*  K  5.4* 5.0  CL 97* 98*  CO2 15* 18*  GLUCOSE 292* 156*  BUN 148* 158*  CREATININE 3.53* 3.38*  CALCIUM 9.5 9.9   Liver Function Tests No results for input(s): AST, ALT, ALKPHOS, BILITOT, PROT, ALBUMIN in the last 72 hours. No results for input(s): LIPASE, AMYLASE in the last 72 hours. Cardiac Enzymes No results for input(s): CKTOTAL, CKMB, CKMBINDEX, TROPONINI in the last 72 hours.  BNP: BNP (last 3 results) Recent Labs    06/26/17 0806 07/02/17 1131 07/03/17 0501  BNP 2,226.0* 1,605.1* 2,179.2*    ProBNP (last 3 results) No results for input(s): PROBNP in the last 8760 hours.   D-Dimer No results for input(s): DDIMER in the last 72 hours. Hemoglobin A1C No results for input(s): HGBA1C in the last 72 hours. Fasting Lipid Panel No results for input(s): CHOL, HDL, LDLCALC, TRIG, CHOLHDL, LDLDIRECT in the last 72 hours. Thyroid Function Tests No results for input(s): TSH, T4TOTAL, T3FREE, THYROIDAB in the last 72 hours.  Invalid input(s): FREET3  Other results:   Imaging    No results found.   Medications:     Scheduled Medications: . amiodarone  200 mg Oral Daily  . calcitRIOL  0.25 mcg Oral Daily  . carvedilol  6.25 mg Oral BID WC  . feeding supplement (  ENSURE ENLIVE)  237 mL Oral TID BM  . ferrous sulfate  325 mg Oral BID  . insulin aspart  0-20 Units Subcutaneous TID WC  . insulin aspart  0-5 Units Subcutaneous QHS  . insulin aspart  6 Units Subcutaneous TID WC  . insulin glargine  14 Units Subcutaneous Daily  . ipratropium-albuterol  3 mL Nebulization BID  . isosorbide mononitrate  30 mg Oral Daily  . levothyroxine  50 mcg Oral QAC breakfast  . multivitamin with minerals  1 tablet Oral Daily  . polyethylene glycol  17 g Oral Daily  . pravastatin  80 mg Oral QHS  . sodium chloride flush  3 mL Intravenous Q12H  . Warfarin - Pharmacist Dosing Inpatient   Does not apply q1800    Infusions: . sodium chloride    . furosemide Stopped (07/05/17 1648)      PRN Medications: sodium chloride, acetaminophen **OR** acetaminophen, albuterol, ondansetron **OR** ondansetron (ZOFRAN) IV, oxyCODONE-acetaminophen **AND** oxyCODONE, senna-docusate, sodium chloride flush    Patient Profile   72 yo with history of chronic systolic CHF from nonischemic cardiomyopathy, prior PE, CKD IV-V, and paroxysmal atrial fibrillation presented with dyspnea and progressive renal dysfunction.   Assessment/Plan   1. Acute on chronic systolic CHF: Echo (2/59) with EF 20-25%, nonischemic cardiomyopathy.  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  On exam today, she is volume overloaded though she is comfortable at rest.  Suspect this is potentiated by progressive renal failure. Suspect the majority of her dyspnea is due to CHF, COPD probably plays a lesser role.  Weight up?  - Continue IV lasix twice a day. Renal following.  - Can continue current Coreg for now.   - Plan to start HD today.  2. 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  Slowly progressive.   I think that she is going to require HD long-term.  Nephrology following.   3. Atrial fibrillation: Paroxysmal.  Was in NSR in 9/18 but has been in atrial fibrillation since admitted this time.  Once her volume status is better controlled, would try to get her back in NSR (likely later this week after HD).  She has been on amiodarone at home.  - Continue amiodarone to 200 mg daily.  - INR has not been therapeutic, will need TEE /DC-CV  4. OSA: Uses CPAP at home, provide here.  5. COPD: On Solumedrol per primary team.  Suspect CHF is main issue.  6. Deconditioning: PT.   Length of Stay: 4  Amy Clegg, NP  07/06/2017, 8:11 AM  Advanced Heart Failure Team Pager [phone] (M-F; [place])  Please contact [facility] for night-coverage after hours (4p -7a ) and weekends on [email or web address]  Patient seen with NP, agree with the above note.  Some diuresis with IV Lasix but  remains fatigued and short of breath with exertion.  Still very volume overloaded on exam.  BUN up to 158 today.    Will continue Lasix 120 mg IV bid, but agree that she is going to be best managed by HD for intractable volume overload and uremia.  To start today.   Once her volume is better controlled with HD, will plan TEE-guided DCCV.   Loralie Champagne 07/06/2017 1:06 PM

## 2017-07-06 NOTE — Progress Notes (Signed)
Occupational Therapy Treatment Patient Details Name: [name] MRN: [id number] DOB: 11/07/1945 Today's Date: 07/06/2017    History of present illness EUPHA LOBB is a 72 y.o. female PHMx: nonischemic cardiomyopathy with EF of 20%, A. fib, DM2, CKD stage IV, anemia of CKD, obstructive sleep apnea on CPAP, dyslipidemia and obesity presented to the ED with increasing shortness of breath since discharge from the hospital on June 30, 2017. Found to have COPD exacerbation and possibly worsening congestive heart failure    OT comments  Pt seen today with focus on bed mobility, rolling, sitting EOB, activity tolerance and pt/family education, & recommendation for CIR screening as pt currently requiring increased assist that husband verbalizes that he may not be able to perform independently at home. Pt and husband agreeable to Rehab after discussion today. D/c recommendations updated.   Follow Up Recommendations  SNF;Supervision/Assistance - 24 hour    Equipment Recommendations  None recommended by OT(Defer to next venue)    Recommendations for Other Services      Precautions / Restrictions Precautions Precautions: Fall Restrictions Weight Bearing Restrictions: No       Mobility Bed Mobility Overal bed mobility: Needs Assistance Bed Mobility: Rolling;Supine to Sit Rolling: Mod assist;Max assist   Supine to sit: Min assist;HOB elevated(Attempted w/ HOB flat and pt unable) Sit to supine: Mod assist   General bed mobility comments: Pt able to assist scooting to [facility] after Mod vc's to use LE's and scoot. Rolling w/ cues for sequencing, increased time and effort and mod-max A LE's noted.  Transfers Overall transfer level: (Not attempted this session)                    Balance Overall balance assessment: Needs assistance Sitting-balance support: Bilateral upper extremity supported;Feet supported;Feet unsupported Sitting balance-Leahy Scale: Poor Sitting balance -  Comments: Pt sitting EOB ~68min with Min-Max A at times, posterior lean noted.  Postural control: Posterior lean                                 ADL either performed or assessed with clinical judgement   ADL Overall ADL's : Needs assistance/impaired     Grooming: Minimal assistance;Bed level(HOB elevated)                                 General ADL Comments: Pt seen for ADL retraining session today with focus on bed mobility(Mod-max A) and sitting up EOB for ~91min before pt stating "I need to get back in bed". Pt was Min-Mod A sitting EOB as she was noted to exhibit posterior lean and lift her feet off the floor and scoot to EOB. She was able to scoot back onto bed with +1 assist and mod vc's. Discussed possibilitiy of SNF Rehab w/ pt/spouse and they were agreeable to this today. Pt husband states that he may not be able to care for pt independently at d/c "Unless she can get well and get around better".  Verbally reivewed acute OT goals w/ pt/husband.     Vision Patient Visual Report: No change from baseline     Perception     Praxis      Cognition Arousal/Alertness: Awake/alert Behavior During Therapy: WFL for tasks assessed/performed;Flat affect Overall Cognitive Status: Within Functional Limits for tasks assessed  Exercises     Shoulder Instructions       General Comments      Pertinent Vitals/ Pain       Pain Assessment: No/denies pain  Home Living                                          Prior Functioning/Environment              Frequency  Min 2X/week        Progress Toward Goals  OT Goals(current goals can now be found in the care plan section)  Progress towards OT goals: (Updated d/c recommendations as pt may benefit from CIR/SNF and pt/husband agreeable to this after discussion today.)  Acute Rehab OT Goals Patient Stated Goal: home  Plan  Discharge plan needs to be updated    Co-evaluation                 AM-PAC PT "6 Clicks" Daily Activity     Outcome Measure   Help from another person eating meals?: None Help from another person taking care of personal grooming?: A Little Help from another person toileting, which includes using toliet, bedpan, or urinal?: A Lot Help from another person bathing (including washing, rinsing, drying)?: A Lot Help from another person to put on and taking off regular upper body clothing?: A Little Help from another person to put on and taking off regular lower body clothing?: Total 6 Click Score: 15    End of Session    OT Visit Diagnosis: Unsteadiness on feet (R26.81);Pain;Muscle weakness (generalized) (M62.81)   Activity Tolerance Patient limited by fatigue   Patient Left in bed;with call bell/phone within reach;with bed alarm set;with family/visitor present   Nurse Communication Other (comment)(IV beeping )        Time: 4967-5916 OT Time Calculation (min): 19 min  Charges: OT General Charges $OT Visit: 1 Visit OT Treatments $Therapeutic Activity: 8-22 mins   [place], Amy Beth Dixon, OTR/L 07/06/2017, 10:05 AM

## 2017-07-06 NOTE — Progress Notes (Signed)
  [facility] Progress Note   Assessment/ Plan:   1. Acute on chronic systolic CHF: pt vol overloaded with progressive CKD--> to start dialysis.  For HD #1 today.  Will initiate CLIP process.  On Coreg and Imdur 2. 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  VVS has been by to eval fistula- OK to stick if needed today and will get duplex when available 3. Anemia: Hgb 11.4, will start ESA when appropriate 4. CKD-MBD: check PTH, on calcitriol 0.25 mcg TIW 5. Nutrition: Albumin 3.6 6. Hypertension: as above in #1 7.  Afib: on warfarin and amiodarone 8: Dispo: pending, perhaps will need SNF  Subjective:    For HD #1 today.   Objective:   BP 104/69   Pulse 74   Temp 97.6 F (36.4 C) (Oral)   Resp 16   Ht 5\' 5"  (1.651 m)   Wt 88.8 kg (195 lb 12.3 oz)   SpO2 98%   BMI 32.58 kg/m   Physical Exam: Gen: older woman, NAD, appears sleepy CVS: RRR soft systolic murmur Resp: muffled bilaterally Abd: soft nontender NABS Ext:1+ LE edema ACCESS: RUE AVF + T/B, tortuous   Labs: BMET Recent Labs  Lab 06/30/17 0505 07/02/17 1131 07/03/17 0501 07/04/17 0349 07/05/17 0844 07/06/17 0624  NA 135 135 134* 129* 128* 130*  K 4.0 4.4 4.5 4.7 5.4* 5.0  CL 100* 99* 99* 99* 97* 98*  CO2 21* 17* 19* 19* 15* 18*  GLUCOSE 135* 189* 241* 208* 292* 156*  BUN 94* 109* 115* 128* 148* 158*  CREATININE 3.42* 4.11* 4.14* 3.93* 3.53* 3.38*  CALCIUM 9.0 9.5 9.3 9.4 9.5 9.9   CBC Recent Labs  Lab 06/30/17 0505 07/02/17 1131 07/03/17 0501 07/05/17 0844  WBC 5.5 7.4 4.3 8.8  NEUTROABS  --  5.8  --   --   HGB 10.8* 11.1* 10.6* 11.4*  HCT 34.1* 35.5* 33.9* 35.6*  MCV 99.4 98.6 97.4 97.8  PLT 206 242 207 203    @IMGRELPRIORS @ Medications:    . amiodarone  200 mg Oral Daily  . calcitRIOL  0.25 mcg Oral Daily  . carvedilol  6.25 mg Oral BID WC  . feeding supplement (ENSURE ENLIVE)  237 mL Oral TID BM  . ferrous sulfate  325 mg Oral BID  . insulin aspart  0-20 Units Subcutaneous TID WC  .  insulin aspart  0-5 Units Subcutaneous QHS  . insulin aspart  6 Units Subcutaneous TID WC  . insulin glargine  14 Units Subcutaneous Daily  . isosorbide mononitrate  30 mg Oral Daily  . levothyroxine  50 mcg Oral QAC breakfast  . multivitamin with minerals  1 tablet Oral Daily  . polyethylene glycol  17 g Oral Daily  . pravastatin  80 mg Oral QHS  . sodium chloride flush  3 mL Intravenous Q12H  . Warfarin - Pharmacist Dosing Inpatient   Does not apply [place], MD [place] pgr [phone] 07/06/2017, 12:28 PM

## 2017-07-06 NOTE — Progress Notes (Addendum)
  Progress Note    07/06/2017 9:28 AM * No surgery found *  Subjective:  No new complaints.  Currently receiving breathing treatment with RT   Vitals:   07/06/17 0828 07/06/17 0842  BP:  104/69  Pulse: (!) 104 74  Resp: 16   Temp:    SpO2: 98%    Physical Exam: Extremities: audible bruit through brachiobasilic fistula in R arm however no definitive thrill; R radial pulse palpable 1+  Abdomen:  soft Neurologic: A&O  CBC    Component Value Date/Time   WBC 8.8 07/05/2017 0844   RBC 3.64 (L) 07/05/2017 0844   HGB 11.4 (L) 07/05/2017 0844   HCT 35.6 (L) 07/05/2017 0844   PLT 203 07/05/2017 0844   MCV 97.8 07/05/2017 0844   MCH 31.3 07/05/2017 0844   MCHC 32.0 07/05/2017 0844   RDW 16.5 (H) 07/05/2017 0844   LYMPHSABS 1.2 07/02/2017 1131   MONOABS 0.4 07/02/2017 1131   EOSABS 0.0 07/02/2017 1131   BASOSABS 0.0 07/02/2017 1131    BMET    Component Value Date/Time   NA 130 (L) 07/06/2017 0624   K 5.0 07/06/2017 0624   CL 98 (L) 07/06/2017 0624   CO2 18 (L) 07/06/2017 0624   GLUCOSE 156 (H) 07/06/2017 0624   BUN 158 (H) 07/06/2017 0624   CREATININE 3.38 (H) 07/06/2017 0624   CREATININE 1.17 (H) 01/20/2013 1010   CALCIUM 9.9 07/06/2017 0624   GFRNONAA 13 (L) 07/06/2017 0624   GFRAA 14 (L) 07/06/2017 0624    INR    Component Value Date/Time   INR 1.94 07/06/2017 0624     Intake/Output Summary (Last 24 hours) at 07/06/2017 0928 Last data filed at 07/06/2017 0600 Gross per 24 hour  Intake 862 ml  Output 1425 ml  Net -563 ml     Assessment/Plan:  72 y.o. female with R brachiocephalic fistula requiring iniation of dialysis during this admission  Ok to dialyze via R arm brachiobasilic if needed today Fistula duplex still pending Recommendations pending duplex; call if unable to catheterize fistula for HD  Dagoberto Ligas, PA-C [facility] [phone] 07/06/2017 9:28 AM   I agree with the above.  I have seen and evaluated the  patient.  If dialysis is required today, I would use her fistula, duplex of fistula still pending  Wells Marquon Alcala

## 2017-07-06 NOTE — Progress Notes (Signed)
PROGRESS NOTE  [name] HQR:[id number] DOB: Jun 12, 1946 DOA: 07/02/2017 PCP: Lemmie Evens, MD  HPI/Recap of past 24 hours: HPI from Dr Randa Spike on 07/02/17  [name] is a 72 y.o. female with medical history significant of NICM with EF of 20% s/p ICD, A. 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 Since discharge from the hospital she has done poorly according to her son and her husband.  She presented to our ER with worsening shortness of breath and generalized fatigue. In the ED, she was given a nebulizer treatment and seemed to improve some with a dose of steroid. Pt admitted for further management.  Today, patient noted to be lethargic, sleepy, but easily arousable.  Met patient with PT attempting to walk patient, patient very weak.  PT consult for now.  Patient denied any chest pain, worsening shortness of breath, dizziness, fever/chills.  Patient to start dialysis today  Assessment/Plan: Principal Problem:   COPD (chronic obstructive pulmonary disease) (HCC) Active Problems:   Dyslipidemia   HTN (hypertension)   History of pulmonary embolism   Type 2 diabetes, uncontrolled, with renal manifestation (HCC)   Chronic renal disease, stage 4, severely decreased glomerular filtration rate (GFR) between 15-29 mL/min/1.73 square meter (HCC)   Chronic anticoagulation   Atrial fibrillation (HCC)   Implantable cardioverter-defibrillator-CRT- Mdt   Anemia in chronic renal disease   Hypothyroidism   SOB (shortness of breath)   Acute on chronic systolic CHF (congestive heart failure), NYHA class 4 (HCC)   Palliative care by specialist   Goals of care, counseling/discussion   Generalized weakness  Acute on chronic  systolic HF New York Heart Association class IV Decompensated BNP 2179, CXR as above ECHO EF 20-25%, s/p ICD X 2, removed due to infection  Cardiology on board: Continue lasix 113m BID, continue coreg, imdur Hold lisinopril Strict I&O, daily weight  Acute on CKD stage IV Vs ESRD Likely Uremic Cr elevated to 4, baseline around 3.5 Held lisinopril, avoid nephrotoxins and monitor closely Nephrology consulted: Started HD on 07/06/17 via AVF  COPD exacerbation SOB likely multifactorial due to CHF, uremia No cough, afebrile, no leukocytosis CXR: Mild cardiomegaly, no vas congestion Continue duonebs, discontinue solumedrol PRN O2   Chronic Afib Rate controlled, still in Afib Continue coumadin, coreg, amiodarone Plan for TEE-guided DCCV once vol overload is controlled   Type 2 diabetes A1c 6.6 SSI, lantus, TID aspart Hypoglycemic protocol  Anemia of chronic renal disease Continue PO iron  Dyslipidemia Continue pravastatin  Hypertension BP soft, held lisinopril Continue coreg, imdur  Hypothyroidism Continue levothyroxine  Goals of care discussion Palliative consulted, husband wants full code and everything done including dialysis   Code Status:  Full  Family Communication: None at bedside  Disposition Plan: Recommend SNF Vs CIR   Consultants:  Cardiology/HF  Nephrology  Palliative  Vascular  Procedures:  None  Antimicrobials:  None  DVT prophylaxis:  Coumadin   Objective: Vitals:   07/06/17 1445 07/06/17 1500 07/06/17 1530 07/06/17 1545  BP: (!) 83/53 93/60 (!) 81/74 (!) 91/48  Pulse: (!) 54 (!) 56 (!) 56 (!) 59  Resp:      Temp:      TempSrc:      SpO2:      Weight:  Height:        Intake/Output Summary (Last 24 hours) at 07/06/2017 1549 Last data filed at 07/06/2017 0600 Gross per 24 hour  Intake 382 ml  Output 1125 ml  Net -743 ml   Filed Weights   07/05/17 0429 07/06/17 0600 07/06/17 1420  Weight: 85.6 kg (188 lb  11.4 oz) 88.8 kg (195 lb 12.3 oz) 88.4 kg (194 lb 14.2 oz)    Exam:   General: Alert, awake, NAD  Cardiovascular: Irregular rate and rhythm, SEM  Respiratory: Bibasilar crackles  Abdomen:  Soft, non-tender, non-distended, BS present  Musculoskeletal: +1 bilateral pedal edema  Skin: Normal  Psychiatry: Normal mood   Data Reviewed: CBC: Recent Labs  Lab 06/30/17 0505 07/02/17 1131 07/03/17 0501 07/05/17 0844 07/06/17 1430  WBC 5.5 7.4 4.3 8.8 11.6*  NEUTROABS  --  5.8  --   --   --   HGB 10.8* 11.1* 10.6* 11.4* 11.3*  HCT 34.1* 35.5* 33.9* 35.6* 34.7*  MCV 99.4 98.6 97.4 97.8 95.9  PLT 206 242 207 203 383   Basic Metabolic Panel: Recent Labs  Lab 07/02/17 1131 07/03/17 0501 07/04/17 0349 07/05/17 0844 07/06/17 0624  NA 135 134* 129* 128* 130*  K 4.4 4.5 4.7 5.4* 5.0  CL 99* 99* 99* 97* 98*  CO2 17* 19* 19* 15* 18*  GLUCOSE 189* 241* 208* 292* 156*  BUN 109* 115* 128* 148* 158*  CREATININE 4.11* 4.14* 3.93* 3.53* 3.38*  CALCIUM 9.5 9.3 9.4 9.5 9.9  MG  --  3.3*  --   --   --    GFR: Estimated Creatinine Clearance: 16.3 mL/min (A) (by C-G formula based on SCr of 3.38 mg/dL (H)). Liver Function Tests: Recent Labs  Lab 07/02/17 1131 07/06/17 1430  AST 36  --   ALT 160* 61*  ALKPHOS 52  --   BILITOT 1.1  --   PROT 6.8  --   ALBUMIN 3.6  --    No results for input(s): LIPASE, AMYLASE in the last 168 hours. No results for input(s): AMMONIA in the last 168 hours. Coagulation Profile: Recent Labs  Lab 07/02/17 1354 07/03/17 0501 07/04/17 0349 07/05/17 0844 07/06/17 0624  INR 3.00 3.37 3.10 2.02 1.94   Cardiac Enzymes: No results for input(s): CKTOTAL, CKMB, CKMBINDEX, TROPONINI in the last 168 hours. BNP (last 3 results) No results for input(s): PROBNP in the last 8760 hours. HbA1C: No results for input(s): HGBA1C in the last 72 hours. CBG: Recent Labs  Lab 07/05/17 1256 07/05/17 1604 07/05/17 2049 07/06/17 0755 07/06/17 1147  GLUCAP  288* 243* 203* 174* 202*   Lipid Profile: No results for input(s): CHOL, HDL, LDLCALC, TRIG, CHOLHDL, LDLDIRECT in the last 72 hours. Thyroid Function Tests: No results for input(s): TSH, T4TOTAL, FREET4, T3FREE, THYROIDAB in the last 72 hours. Anemia Panel: No results for input(s): VITAMINB12, FOLATE, FERRITIN, TIBC, IRON, RETICCTPCT in the last 72 hours. Urine analysis:    Component Value Date/Time   COLORURINE YELLOW 07/02/2017 1123   APPEARANCEUR HAZY (A) 07/02/2017 1123   LABSPEC 1.013 07/02/2017 1123   PHURINE 5.0 07/02/2017 1123   GLUCOSEU NEGATIVE 07/02/2017 1123   HGBUR LARGE (A) 07/02/2017 1123   BILIRUBINUR NEGATIVE 07/02/2017 [place] 07/02/2017 1123   PROTEINUR NEGATIVE 07/02/2017 1123   UROBILINOGEN 0.2 01/08/2015 0940   NITRITE NEGATIVE 07/02/2017 1123   LEUKOCYTESUR NEGATIVE 07/02/2017 1123   Sepsis Labs: '@LABRCNTIP' (procalcitonin:4,lacticidven:4)  ) Recent Results (from the past 240 hour(s))  MRSA PCR Screening  Status: None   Collection Time: 06/26/17  5:46 PM  Result Value Ref Range Status   MRSA by PCR NEGATIVE NEGATIVE Final    Comment:        The GeneXpert MRSA Assay (FDA approved for NASAL specimens only), is one component of a comprehensive MRSA colonization surveillance program. It is not intended to diagnose MRSA infection nor to guide or monitor treatment for MRSA infections.       Studies: No results found.  Scheduled Meds: . amiodarone  200 mg Oral Daily  . calcitRIOL  0.25 mcg Oral Daily  . carvedilol  6.25 mg Oral BID WC  . feeding supplement (ENSURE ENLIVE)  237 mL Oral TID BM  . ferrous sulfate  325 mg Oral BID  . insulin aspart  0-20 Units Subcutaneous TID WC  . insulin aspart  0-5 Units Subcutaneous QHS  . insulin aspart  9 Units Subcutaneous TID WC  . insulin glargine  14 Units Subcutaneous Daily  . isosorbide mononitrate  30 mg Oral Daily  . levothyroxine  50 mcg Oral QAC breakfast  . multivitamin  with minerals  1 tablet Oral Daily  . polyethylene glycol  17 g Oral Daily  . pravastatin  80 mg Oral QHS  . sodium chloride flush  3 mL Intravenous Q12H  . warfarin  2.5 mg Oral ONCE-1800  . Warfarin - Pharmacist Dosing Inpatient   Does not apply q1800    Continuous Infusions: . sodium chloride    . sodium chloride    . sodium chloride    . furosemide Stopped (07/06/17 1046)     LOS: 4 days     Alma Friendly, MD [facility]   If 7PM-7AM, please contact night-coverage [email or web address] Password [facility] 07/06/2017, 3:49 PM

## 2017-07-06 NOTE — Procedures (Signed)
Patient seen and examined on Hemodialysis. QB 250 mL/ min UF goal 500 mL  Treatment adjusted as needed.  Madelon Lips MD [facility] pgr [phone] 4:06 PM

## 2017-07-06 NOTE — Progress Notes (Signed)
Physical Therapy Treatment Patient Details Name: [name] MRN: [id number] DOB: 1945/11/19 Today's Date: 07/06/2017    History of Present Illness [name] is a 72 y.o. female PHMx: nonischemic cardiomyopathy with EF of 20%, A. fib, DM2, CKD stage IV, anemia of CKD, obstructive sleep apnea on CPAP, dyslipidemia and obesity presented to the ED with increasing shortness of breath since discharge from the hospital on June 30, 2017. Found to have COPD exacerbation and possibly worsening congestive heart failure     PT Comments    Patient received in bed, pleasant and willing to work with skilled PT services this morning. She continues to require Mod assist for functional bed mobility and also continues to display considerable posterior lean in sitting requiring assistance to correct. She required Mod assist for sit to stand today, continuing to display some posterior lean including strong lean onto bed posteriorly. Only able to maintain standing for approximately 5 seconds with Max assist before requiring PT assistance to safely sit back on bed in controlled manner. Note attending MD entered room during standing activity, reports she would like PT discharged from patient's care for now due to declining medical status; MD aware PT will need to be re-ordered before we can continue to work with patient at this point. DC of skilled PT services today per MD request, plan to await new PT order when patient is medically appropriate, thank you for the opportunity to participate in the care of this patient.      Follow Up Recommendations  Other (comment);Home health PT;Supervision/Assistance - 24 hour(note patient may benefit from SNF based on performance today, plan to re-evaluate when new MD PT order is placed )     Equipment Recommendations  None recommended by PT    Recommendations for Other Services       Precautions / Restrictions Precautions Precautions: Fall Restrictions Weight  Bearing Restrictions: No    Mobility  Bed Mobility Overal bed mobility: Needs Assistance Bed Mobility: Supine to Sit Rolling: Mod assist;Max assist   Supine to sit: Mod assist Sit to supine: Mod assist   General bed mobility comments: patient requiring Mod assist for bed transfers today due to back pain, fatigue   Transfers Overall transfer level: Needs assistance Equipment used: Rolling walker (2 wheeled) Transfers: Sit to/from Stand Sit to Stand: Mod assist         General transfer comment: cues for hand placement, assist to power up; noted severe posterior lean against bed in standing today, Max assist to maintain standing for approximately 5 seconds before sitting back down with PT assist for safe lower   Ambulation/Gait             General Gait Details: DNT, unable to safely do so    Stairs            Wheelchair Mobility    Modified Rankin (Stroke Patients Only)       Balance Overall balance assessment: Needs assistance Sitting-balance support: Bilateral upper extremity supported;Feet supported;Feet unsupported Sitting balance-Leahy Scale: Poor Sitting balance - Comments: posterior lean, Min-ModA to maintain upright sitting  Postural control: Posterior lean Standing balance support: Bilateral upper extremity supported;During functional activity Standing balance-Leahy Scale: Poor Standing balance comment: reliant on RW, leaning up against bed in static standing                             Cognition Arousal/Alertness: Awake/alert Behavior During Therapy: [facility] for tasks  assessed/performed;Flat affect Overall Cognitive Status: Within Functional Limits for tasks assessed                                        Exercises      General Comments        Pertinent Vitals/Pain Pain Assessment: No/denies pain Pain Score: 8  Pain Location: back Pain Descriptors / Indicators: Sore;Aching Pain Intervention(s): Premedicated  before session;Monitored during session;Limited activity within patient's tolerance    Home Living                      Prior Function            PT Goals (current goals can now be found in the care plan section) Acute Rehab PT Goals Patient Stated Goal: home PT Goal Formulation: With patient/family Time For Goal Achievement: 07/18/17 Potential to Achieve Goals: Good Progress towards PT goals: Not progressing toward goals - comment(medical status worsening per attending MD, who requests DC of PT for now )    Frequency    Other (Comment)(DC PT for now per attending MD )      PT Plan Other (comment);Discharge plan needs to be updated;Frequency needs to be updated(DC PT for now per attending MD )    Co-evaluation              AM-PAC PT "6 Clicks" Daily Activity  Outcome Measure  Difficulty turning over in bed (including adjusting bedclothes, sheets and blankets)?: Unable Difficulty moving from lying on back to sitting on the side of the bed? : Unable Difficulty sitting down on and standing up from a chair with arms (e.g., wheelchair, bedside commode, etc,.)?: Unable Help needed moving to and from a bed to chair (including a wheelchair)?: A Lot Help needed walking in hospital room?: A Lot Help needed climbing 3-5 steps with a railing? : A Lot 6 Click Score: 9    End of Session Equipment Utilized During Treatment: Gait belt Activity Tolerance: Patient limited by pain;Patient limited by fatigue Patient left: in bed;with call bell/phone within reach;with family/visitor present Nurse Communication: Other (comment)(asked CNA to replace purewick ) PT Visit Diagnosis: Unsteadiness on feet (R26.81);Other abnormalities of gait and mobility (R26.89);Muscle weakness (generalized) (M62.81)     Time: 6384-6659 PT Time Calculation (min) (ACUTE ONLY): 15 min  Charges:  $Therapeutic Activity: 8-22 mins                    G Codes:       Deniece Ree PT, DPT,  CBIS  Supplemental Physical Therapist [place]   Pager [phone]

## 2017-07-06 NOTE — Progress Notes (Signed)
PT Cancellation Note  Patient Details Name: [name] MRN: [id number] DOB: Oct 15, 1945   Cancelled Treatment:    Reason Eval/Treat Not Completed: Pain limiting ability to participate Patient experiencing back pain 8/10 and in middle of receiving medicine from/working with nurse, PT to return later in day as time allows.    Deniece Ree PT, DPT, CBIS  Supplemental Physical Therapist [facility]   Pager [phone]

## 2017-07-06 NOTE — Progress Notes (Signed)
Inpatient Diabetes Program Recommendations  AACE/ADA: New Consensus Statement on Inpatient Glycemic Control (2015)  Target Ranges:  Prepandial:   less than 140 mg/dL      Peak postprandial:   less than 180 mg/dL (1-2 hours)      Critically ill patients:  140 - 180 mg/dL   Lab Results  Component Value Date   GLUCAP 202 (H) 07/06/2017   HGBA1C 6.6 (H) 07/03/2017    Review of Glycemic Control  Post-prandial blood sugars elevated.  Inpatient Diabetes Program Recommendations:     Increase Novolog to 9 units tidwc for meal coverage insulin.  Continue to follow and make recs regarding glycemic control.   Thank you. Lorenda Peck, RD, LDN, CDE Inpatient Diabetes Coordinator [phone]

## 2017-07-06 NOTE — Progress Notes (Signed)
ANTICOAGULATION CONSULT NOTE - Follow Up Consult  Pharmacy Consult for warfarin Indication: atrial fibrillation and VTE prophylaxis  No Known Allergies  Patient Measurements: Height: 5\' 5"  (165.1 cm) Weight: 194 lb 14.2 oz (88.4 kg) IBW/kg (Calculated) : 57   Vital Signs: Temp: 97.7 F (36.5 C) (01/14 1420) Temp Source: Oral (01/14 1420) BP: 83/53 (01/14 1445) Pulse Rate: 54 (01/14 1445)  Labs: Recent Labs    07/04/17 0349 07/05/17 0844 07/06/17 0624 07/06/17 1430  HGB  --  11.4*  --  11.3*  HCT  --  35.6*  --  34.7*  PLT  --  203  --  217  LABPROT 31.7* 22.7* 22.0*  --   INR 3.10 2.02 1.94  --   CREATININE 3.93* 3.53* 3.38*  --     Estimated Creatinine Clearance: 16.3 mL/min (A) (by C-G formula based on SCr of 3.38 mg/dL (H)).   Medical History: Past Medical History:  Diagnosis Date  . Anemia    a. mild/chronic  . Anemia due to GI blood loss 02/26/2016  . Anemia in chronic renal disease 11/30/2014  . Cardiomyopathy, nonischemic ([place])    a. 1999 nl cath;  b. 12/05 [employer] [place];  c. 10/2005 ICD extraction 2/2 enterococcus bacteremia and Veg on RV lead;  c. 05/2008 low risk Myoview (scarring w/ some evidence of inf ischemia);  d. 11/2012 Echo: EF 15-20%;  e. 01/2013 s/p MDT [place] CRT D, ser # [id number];  f. 05/2013 Echo: EF 15%.  . Chronic systolic CHF (congestive heart failure) ([place])    a. 11/2012 Echo: EF 15-20%;  b. 05/2013 TEE EF 15%.  . CKD (chronic kidney disease), stage III ([place])    creatinin-1.44 in 1/09; 1.51 in 1/10  . Degenerative joint disease    of knees, shoulder, and hips  . Enterococcal infection    a. 10/2005 - AICD-explanted  . GERD (gastroesophageal reflux disease)   . Hilar density    a. infrahilar mass/adenopathy on CT scan 5/07; subsequently  resolved  . History of blood transfusion   . Hyperlipidemia   . Hypertension   . Hypothyroidism   . ICD (implantable cardioverter-defibrillator) infection ([place])    removed 2016  . Implantable  cardioverter-defibrillator-CRT- Mdt    a.  01/2013 s/p MDT [place] CRT D, ser # [id number] - ICD has been removed  . LBBB (left bundle branch block)   . Obstructive sleep apnea    uses cpap  . PAF (paroxysmal atrial fibrillation) ([place])    a. 07/2012 s/p TEE/DCCV;  b. chronic coumadin;  c. 05/2013 Recurrent Afib->TEE/DCCV and amio initiation.  . Peripheral vascular disease ([place])   . Pneumonia 07/2005  . Pulmonary embolism ([place])    a. 07/2005 after total right hip arthroplasty  . Tobacco abuse    a. discontinued in 1997, and then resumed  . Type II diabetes mellitus ([place])    type 2  . Urinary incontinence   . Villous adenoma of colon    a. tubovillous adenomatous polyp with focal high grade dysplasia; presented with hematochezia - followed by Dr. Laural Golden.    Medications:  Scheduled:  . amiodarone  200 mg Oral Daily  . calcitRIOL  0.25 mcg Oral Daily  . carvedilol  6.25 mg Oral BID WC  . feeding supplement (ENSURE ENLIVE)  237 mL Oral TID BM  . ferrous sulfate  325 mg Oral BID  . insulin aspart  0-20 Units Subcutaneous TID WC  . insulin aspart  0-5 Units Subcutaneous QHS  .  insulin aspart  6 Units Subcutaneous TID WC  . insulin glargine  14 Units Subcutaneous Daily  . isosorbide mononitrate  30 mg Oral Daily  . levothyroxine  50 mcg Oral QAC breakfast  . multivitamin with minerals  1 tablet Oral Daily  . polyethylene glycol  17 g Oral Daily  . pravastatin  80 mg Oral QHS  . sodium chloride flush  3 mL Intravenous Q12H  . warfarin  2.5 mg Oral ONCE-1800  . Warfarin - Pharmacist Dosing Inpatient   Does not apply q1800    Assessment: 72 y.o female presented with SOB and recent admit on 1/4 for CHF exacerbation.  On warfarin for afib prior to admission with therapeutic INR 3 upon admission.   Home regimen: 1.25 mg on MWF and 2.5 mg all other days (weekly dose 13.75mg ).  INR trended down from 3.1 to 2.02 after holding dose 1/11 evening. Last dose was 2.5 mg on 1/11 at 0300. Hgb is  stable at 11.3 and platelets WNL. No signs/symptoms of bleeding. On concurrent methylpredisolone for COPD exacerbation, which can impact warfarin sensitivity.   Goal of Therapy:  INR 2-3 Monitor platelets by anticoagulation protocol: Yes   Plan:  Warfarin 2.5 mg x1 tonight. Monitor daily INR, CBC, signs/symptoms of bleeding  Thank you for allowing [place] to participate in this patient's care.  Uvaldo Rising, BCPS  Clinical Pharmacist Pager [phone]  07/06/2017 3:01 PM

## 2017-07-06 NOTE — Progress Notes (Signed)
Visited with patient regarding Advanced Directives.  Explained the document to patient and her family member who is at bedside and they both stated that they didn't request it but would like to take the paperwork and read over it and decide if it is something they would like to complete.  Chaplain made them aware that we can get it completed while patient is an inpatient so that we can notarize it and place it on the chart.  Document left with them and can follow up as needed.

## 2017-07-07 ENCOUNTER — Inpatient Hospital Stay (HOSPITAL_COMMUNITY): Payer: Medicare HMO

## 2017-07-07 DIAGNOSIS — R7309 Other abnormal glucose: Secondary | ICD-10-CM

## 2017-07-07 DIAGNOSIS — J438 Other emphysema: Secondary | ICD-10-CM

## 2017-07-07 DIAGNOSIS — Z992 Dependence on renal dialysis: Secondary | ICD-10-CM

## 2017-07-07 DIAGNOSIS — I4891 Unspecified atrial fibrillation: Secondary | ICD-10-CM

## 2017-07-07 DIAGNOSIS — I428 Other cardiomyopathies: Secondary | ICD-10-CM

## 2017-07-07 DIAGNOSIS — I5023 Acute on chronic systolic (congestive) heart failure: Secondary | ICD-10-CM

## 2017-07-07 DIAGNOSIS — N186 End stage renal disease: Secondary | ICD-10-CM

## 2017-07-07 DIAGNOSIS — N184 Chronic kidney disease, stage 4 (severe): Secondary | ICD-10-CM

## 2017-07-07 DIAGNOSIS — R531 Weakness: Secondary | ICD-10-CM

## 2017-07-07 DIAGNOSIS — I509 Heart failure, unspecified: Secondary | ICD-10-CM

## 2017-07-07 DIAGNOSIS — R5383 Other fatigue: Secondary | ICD-10-CM

## 2017-07-07 DIAGNOSIS — D72829 Elevated white blood cell count, unspecified: Secondary | ICD-10-CM

## 2017-07-07 DIAGNOSIS — G4733 Obstructive sleep apnea (adult) (pediatric): Secondary | ICD-10-CM

## 2017-07-07 DIAGNOSIS — I480 Paroxysmal atrial fibrillation: Secondary | ICD-10-CM

## 2017-07-07 DIAGNOSIS — D631 Anemia in chronic kidney disease: Secondary | ICD-10-CM

## 2017-07-07 DIAGNOSIS — Z95810 Presence of automatic (implantable) cardiac defibrillator: Secondary | ICD-10-CM

## 2017-07-07 DIAGNOSIS — N039 Chronic nephritic syndrome with unspecified morphologic changes: Secondary | ICD-10-CM

## 2017-07-07 DIAGNOSIS — I481 Persistent atrial fibrillation: Secondary | ICD-10-CM

## 2017-07-07 DIAGNOSIS — E1165 Type 2 diabetes mellitus with hyperglycemia: Secondary | ICD-10-CM

## 2017-07-07 DIAGNOSIS — E1129 Type 2 diabetes mellitus with other diabetic kidney complication: Secondary | ICD-10-CM

## 2017-07-07 DIAGNOSIS — R739 Hyperglycemia, unspecified: Secondary | ICD-10-CM

## 2017-07-07 DIAGNOSIS — R5381 Other malaise: Secondary | ICD-10-CM

## 2017-07-07 DIAGNOSIS — R0602 Shortness of breath: Secondary | ICD-10-CM

## 2017-07-07 DIAGNOSIS — I10 Essential (primary) hypertension: Secondary | ICD-10-CM

## 2017-07-07 DIAGNOSIS — E039 Hypothyroidism, unspecified: Secondary | ICD-10-CM

## 2017-07-07 DIAGNOSIS — D62 Acute posthemorrhagic anemia: Secondary | ICD-10-CM

## 2017-07-07 LAB — RENAL FUNCTION PANEL
ANION GAP: 13 (ref 5–15)
Albumin: 3.1 g/dL — ABNORMAL LOW (ref 3.5–5.0)
BUN: 103 mg/dL — ABNORMAL HIGH (ref 6–20)
CHLORIDE: 98 mmol/L — AB (ref 101–111)
CO2: 23 mmol/L (ref 22–32)
Calcium: 9.9 mg/dL (ref 8.9–10.3)
Creatinine, Ser: 2.5 mg/dL — ABNORMAL HIGH (ref 0.44–1.00)
GFR calc non Af Amer: 18 mL/min — ABNORMAL LOW (ref >60)
GFR, EST AFRICAN AMERICAN: 21 mL/min — AB (ref >60)
Glucose, Bld: 216 mg/dL — ABNORMAL HIGH (ref 65–99)
Phosphorus: 5.2 mg/dL — ABNORMAL HIGH (ref 2.5–4.6)
Potassium: 4.5 mmol/L (ref 3.5–5.1)
Sodium: 134 mmol/L — ABNORMAL LOW (ref 135–145)

## 2017-07-07 LAB — GLUCOSE, CAPILLARY
GLUCOSE-CAPILLARY: 134 mg/dL — AB (ref 65–99)
GLUCOSE-CAPILLARY: 161 mg/dL — AB (ref 65–99)
Glucose-Capillary: 128 mg/dL — ABNORMAL HIGH (ref 65–99)
Glucose-Capillary: 56 mg/dL — ABNORMAL LOW (ref 65–99)
Glucose-Capillary: 160 mg/dL — ABNORMAL HIGH (ref 65–99)

## 2017-07-07 LAB — BASIC METABOLIC PANEL
Anion gap: 12 (ref 5–15)
BUN: 96 mg/dL — AB (ref 6–20)
CHLORIDE: 98 mmol/L — AB (ref 101–111)
CO2: 22 mmol/L (ref 22–32)
Calcium: 9.4 mg/dL (ref 8.9–10.3)
Creatinine, Ser: 2.38 mg/dL — ABNORMAL HIGH (ref 0.44–1.00)
GFR calc non Af Amer: 19 mL/min — ABNORMAL LOW (ref >60)
GFR, EST AFRICAN AMERICAN: 22 mL/min — AB (ref >60)
GLUCOSE: 131 mg/dL — AB (ref 65–99)
Potassium: 4.3 mmol/L (ref 3.5–5.1)
Sodium: 132 mmol/L — ABNORMAL LOW (ref 135–145)

## 2017-07-07 LAB — PROTIME-INR
INR: 1.86
Prothrombin Time: 21.2 seconds — ABNORMAL HIGH (ref 11.4–15.2)

## 2017-07-07 LAB — CBC
HCT: 38.4 % (ref 36.0–46.0)
HEMOGLOBIN: 12.5 g/dL (ref 12.0–15.0)
MCH: 31.6 pg (ref 26.0–34.0)
MCHC: 32.6 g/dL (ref 30.0–36.0)
MCV: 97 fL (ref 78.0–100.0)
PLATELETS: 216 10*3/uL (ref 150–400)
RBC: 3.96 MIL/uL (ref 3.87–5.11)
RDW: 16.9 % — ABNORMAL HIGH (ref 11.5–15.5)
WBC: 12.5 10*3/uL — ABNORMAL HIGH (ref 4.0–10.5)

## 2017-07-07 LAB — HEPATITIS B CORE ANTIBODY, TOTAL: Hep B Core Total Ab: NEGATIVE

## 2017-07-07 MED ORDER — WARFARIN SODIUM 2 MG PO TABS
4.0000 mg | ORAL_TABLET | Freq: Once | ORAL | Status: AC
  Administered 2017-07-07: 4 mg via ORAL
  Filled 2017-07-07: qty 2

## 2017-07-07 MED ORDER — ZOLPIDEM TARTRATE 5 MG PO TABS
5.0000 mg | ORAL_TABLET | Freq: Every evening | ORAL | Status: DC | PRN
Start: 2017-07-07 — End: 2017-07-15
  Administered 2017-07-11 (×2): 5 mg via ORAL
  Filled 2017-07-07 (×2): qty 1

## 2017-07-07 NOTE — Progress Notes (Addendum)
  Progress Note    07/07/2017 10:45 AM  Subjective:  R arm somewhat sore after dialysis   Vitals:   07/07/17 0328 07/07/17 0810  BP: (!) 90/54 (!) 90/55  Pulse: 70 70  Resp: 18   Temp: 97.9 F (36.6 C)   SpO2: 98%    Physical Exam: Lungs:  Non labored Incisions:  None Extremities:  Palpable R radial 1+; palpable thrill through transposed brachiobasilic fistulaat AC fossa to mid upper arm   Abdomen:  Soft Neurologic: A&O  CBC    Component Value Date/Time   WBC 11.6 (H) 07/06/2017 1430   RBC 3.62 (L) 07/06/2017 1430   HGB 11.3 (L) 07/06/2017 1430   HCT 34.7 (L) 07/06/2017 1430   PLT 217 07/06/2017 1430   MCV 95.9 07/06/2017 1430   MCH 31.2 07/06/2017 1430   MCHC 32.6 07/06/2017 1430   RDW 16.4 (H) 07/06/2017 1430   LYMPHSABS 1.2 07/02/2017 1131   MONOABS 0.4 07/02/2017 1131   EOSABS 0.0 07/02/2017 1131   BASOSABS 0.0 07/02/2017 1131    BMET    Component Value Date/Time   NA 132 (L) 07/07/2017 0623   K 4.3 07/07/2017 0623   CL 98 (L) 07/07/2017 0623   CO2 22 07/07/2017 0623   GLUCOSE 131 (H) 07/07/2017 0623   BUN 96 (H) 07/07/2017 0623   CREATININE 2.38 (H) 07/07/2017 0623   CREATININE 1.17 (H) 01/20/2013 1010   CALCIUM 9.4 07/07/2017 0623   GFRNONAA 19 (L) 07/07/2017 0623   GFRAA 22 (L) 07/07/2017 0623    INR    Component Value Date/Time   INR 1.86 07/07/2017 0623     Intake/Output Summary (Last 24 hours) at 07/07/2017 1045 Last data filed at 07/07/2017 0924 Gross per 24 hour  Intake 724 ml  Output 800 ml  Net -76 ml     Assessment/Plan:  72 y.o. female with R brachiocephalic fistula requiring iniation of dialysis during this admission  Palpable thrill through fistula to the level of mid upper arm Fistula duplex pending Successful HD treatment with R brachiocephalic fistula Ok to use R brachiobasilic again today   Dagoberto Ligas, PA-C [facility] [phone] 07/07/2017 10:45 AM

## 2017-07-07 NOTE — Plan of Care (Signed)
  Education: Knowledge of General Education information will improve 07/07/2017 0049 - Progressing by Tristan Schroeder, RN   Health Behavior/Discharge Planning: Ability to manage health-related needs will improve 07/07/2017 0049 - Progressing by Tristan Schroeder, RN   Pain Managment: General experience of comfort will improve 07/07/2017 0049 - Progressing by Tristan Schroeder, RN   Safety: Ability to remain free from injury will improve 07/07/2017 0049 - Progressing by Tristan Schroeder, RN

## 2017-07-07 NOTE — Plan of Care (Signed)
  Health Behavior/Discharge Planning: Ability to manage health-related needs will improve 07/07/2017 2328 - Progressing by Tristan Schroeder, RN   Nutrition: Adequate nutrition will be maintained 07/07/2017 2328 - Progressing by Tristan Schroeder, RN

## 2017-07-07 NOTE — Progress Notes (Signed)
HD tx initiated via 17Gx2 w/o problem, pull/push/flush equally w/o problem, VSS w/ soft bp, will cont to monitor while on HD tx

## 2017-07-07 NOTE — Progress Notes (Signed)
Report given to current nurse. Patient in HD.

## 2017-07-07 NOTE — Progress Notes (Signed)
Patient ID: [name], female   DOB: [birth year], 72 y.o.   MRN: [id number]     Advanced Heart Failure Rounding Note  Primary Cardiologist: Aundra Dubin  Subjective:    Tolerated HD 1/14 but had hypotension.    Complaining of fatigue. Denies SOB.     Objective:   Weight Range: 198 lb 13.7 oz (90.2 kg) Body mass index is 33.09 kg/m.   Vital Signs:   Temp:  [97.6 F (36.4 C)-97.9 F (36.6 C)] 97.9 F (36.6 C) (01/15 0328) Pulse Rate:  [54-104] 70 (01/15 0328) Resp:  [13-18] 18 (01/15 0328) BP: (79-112)/(48-74) 90/54 (01/15 0328) SpO2:  [95 %-98 %] 98 % (01/15 0328) Weight:  [194 lb 14.2 oz (88.4 kg)-198 lb 13.7 oz (90.2 kg)] 198 lb 13.7 oz (90.2 kg) (01/15 0328) Last BM Date: 07/02/17  Weight change: Filed Weights   07/06/17 1420 07/06/17 1658 07/07/17 0328  Weight: 194 lb 14.2 oz (88.4 kg) 194 lb 14.2 oz (88.4 kg) 198 lb 13.7 oz (90.2 kg)    Intake/Output:   Intake/Output Summary (Last 24 hours) at 07/07/2017 0756 Last data filed at 07/07/2017 0535 Gross per 24 hour  Intake 604 ml  Output 800 ml  Net -196 ml      Physical Exam    General:  Appears chronically ill. No resp difficulty HEENT: normal Neck: supple. JVP to jaw. Carotids 2+ bilat; no bruits. No lymphadenopathy or thryomegaly appreciated. Cor: PMI nondisplaced. Irregular rate & rhythm. No rubs, or murmurs. + S3  Lungs: clear Abdomen: soft, nontender, nondistended. No hepatosplenomegaly. No bruits or masses. Good bowel sounds. Extremities: no cyanosis, clubbing, rash, edema. RUE AVF Neuro: alert & orientedx3, cranial nerves grossly intact. moves all 4 extremities w/o difficulty. Affect pleasant   Telemetry    Afib 100s personally reviewed.   EKG    Atrial fibrillation with wide LBBB  Labs    CBC Recent Labs    07/05/17 0844 07/06/17 1430  WBC 8.8 11.6*  HGB 11.4* 11.3*  HCT 35.6* 34.7*  MCV 97.8 95.9  PLT 203 989   Basic Metabolic Panel Recent Labs    07/06/17 0624 07/07/17 0623    NA 130* 132*  K 5.0 4.3  CL 98* 98*  CO2 18* 22  GLUCOSE 156* 131*  BUN 158* 96*  CREATININE 3.38* 2.38*  CALCIUM 9.9 9.4   Liver Function Tests Recent Labs    07/06/17 1430  ALT 61*   No results for input(s): LIPASE, AMYLASE in the last 72 hours. Cardiac Enzymes No results for input(s): CKTOTAL, CKMB, CKMBINDEX, TROPONINI in the last 72 hours.  BNP: BNP (last 3 results) Recent Labs    06/26/17 0806 07/02/17 1131 07/03/17 0501  BNP 2,226.0* 1,605.1* 2,179.2*    ProBNP (last 3 results) No results for input(s): PROBNP in the last 8760 hours.   D-Dimer No results for input(s): DDIMER in the last 72 hours. Hemoglobin A1C No results for input(s): HGBA1C in the last 72 hours. Fasting Lipid Panel No results for input(s): CHOL, HDL, LDLCALC, TRIG, CHOLHDL, LDLDIRECT in the last 72 hours. Thyroid Function Tests No results for input(s): TSH, T4TOTAL, T3FREE, THYROIDAB in the last 72 hours.  Invalid input(s): FREET3  Other results:   Imaging    No results found.   Medications:     Scheduled Medications: . amiodarone  200 mg Oral Daily  . calcitRIOL  0.25 mcg Oral Daily  . feeding supplement (ENSURE ENLIVE)  237 mL Oral TID BM  . ferrous sulfate  325 mg Oral BID  . insulin aspart  0-20 Units Subcutaneous TID WC  . insulin aspart  0-5 Units Subcutaneous QHS  . insulin aspart  9 Units Subcutaneous TID WC  . insulin glargine  14 Units Subcutaneous Daily  . isosorbide mononitrate  30 mg Oral Daily  . levothyroxine  50 mcg Oral QAC breakfast  . multivitamin with minerals  1 tablet Oral Daily  . polyethylene glycol  17 g Oral Daily  . pravastatin  80 mg Oral QHS  . sodium chloride flush  3 mL Intravenous Q12H  . Warfarin - Pharmacist Dosing Inpatient   Does not apply q1800    Infusions: . sodium chloride    . furosemide Stopped (07/06/17 2013)    PRN Medications: sodium chloride, acetaminophen **OR** acetaminophen, albuterol,  guaiFENesin-dextromethorphan, ondansetron **OR** ondansetron (ZOFRAN) IV, oxyCODONE-acetaminophen **AND** oxyCODONE, senna-docusate, sodium chloride flush    Patient Profile   72 yo with history of chronic systolic CHF from nonischemic cardiomyopathy, prior PE, CKD IV-V, and paroxysmal atrial fibrillation presented with dyspnea and progressive renal dysfunction.   Assessment/Plan   1. Acute on chronic systolic CHF: Echo (6/62) with EF 20-25%, nonischemic cardiomyopathy.  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  On exam today, she is volume overloaded though she is comfortable at rest.  Suspect this is potentiated by progressive renal failure. Suspect the majority of her dyspnea is due to CHF, COPD probably plays a lesser role.  Started HD 1/14 but unable remove fluid due to hypotension.  Coreg stopped 1/14 Stop IV lasix.    - Plan for HD today.   2. 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  Had HD 1/14.   Nephrology following.   3. Atrial fibrillation: Paroxysmal.  Was in NSR in 9/18 but has been in atrial fibrillation since admitted this time.  Once her volume status is better controlled, would try to get her back in NSR (likely later this week after HD).  She has been on amiodarone at home.  - Continue amiodarone to 200 mg daily.  - INR 1.86. will need TEE /DC-CV once volume status better.  4. OSA: Uses CPAP at home, provide here.  5. COPD: On Solumedrol per primary team.  Suspect CHF is main issue.  6. Deconditioning: PT. OT recommending  SNF  Length of Stay: 5  Amy Clegg, NP  07/07/2017, 7:56 AM  Advanced Heart Failure Team Pager [phone] (M-F; [place])  Please contact [facility] for night-coverage after hours (4p -7a ) and weekends on [email or web address]  Patient seen with NP, agree with the above note.  Hypotension with HD yesterday.  Stop IV Lasix and Coreg.  Retry HD today.  Continue amiodarone, when volume controlled will plan  TEE-guided DCCV.   Loralie Champagne 07/07/2017 8:55 AM

## 2017-07-07 NOTE — Progress Notes (Signed)
Patient is for SNF placement; SW referral placed; B Pennie Rushing [phone]

## 2017-07-07 NOTE — Progress Notes (Addendum)
BP soft last night, patient asymptomatic. Complains of not getting sleep at night. Not wearing CPAP or O2.

## 2017-07-07 NOTE — Progress Notes (Signed)
Right AVF duplex: patent AVF. Landry Mellow, RDMS, RVT

## 2017-07-07 NOTE — Consult Note (Signed)
Physical Medicine and Rehabilitation Consult   Reason for Consult: Debility Referring Physician: Dr. Horris Latino   HPI: [name] is a 72 y.o. female with history of PAF, OSA, COPD, NICM s/p ICD, CKD Stage IV who was readmitted on 07/02/17 with SOB felt to be due to COPD exacerbation v/s fluid overload. She was treated with steroids and nebulizer's without improvement in SOB, fatigue and lethargy.  Palliative care consulted for input and patient indicated full code and full scope of treatment. Dr. Jonnie Finner consulted and felt that patient appeared uremic and recommended initiating HD. Cardiology consulted and helping manage fluid overload as well as A fib--patient not a candidate for LVAD.      She was cleared to start using AVG and HD initiated 1/14 --limited by soft BP.  Dr. Aundra Dubin recommends DCCV later this week once volume status improves. Therapy initiated and limited by fatigue and hypotension. Goals downgraded to SNF for follow up therapy.   Review of Systems  Constitutional: Positive for malaise/fatigue.  Respiratory: Positive for shortness of breath.   Neurological: Positive for dizziness, weakness and headaches.  All other systems reviewed and are negative.  Past Medical History:  Diagnosis Date  . Anemia    a. mild/chronic  . Anemia due to GI blood loss 02/26/2016  . Anemia in chronic renal disease 11/30/2014  . Cardiomyopathy, nonischemic ([place])    a. 1999 nl cath;  b. 12/05 [employer]ant [place];  c. 10/2005 ICD extraction 2/2 enterococcus bacteremia and Veg on RV lead;  c. 05/2008 low risk Myoview (scarring w/ some evidence of inf ischemia);  d. 11/2012 Echo: EF 15-20%;  e. 01/2013 s/p MDT [place] CRT D, ser # [id number];  f. 05/2013 Echo: EF 15%.  . Chronic systolic CHF (congestive heart failure) ([place])    a. 11/2012 Echo: EF 15-20%;  b. 05/2013 TEE EF 15%.  . CKD (chronic kidney disease), stage III ([place])    creatinin-1.44 in 1/09; 1.51 in 1/10  . Degenerative joint disease    of  knees, shoulder, and hips  . Enterococcal infection    a. 10/2005 - AICD-explanted  . GERD (gastroesophageal reflux disease)   . Hilar density    a. infrahilar mass/adenopathy on CT scan 5/07; subsequently  resolved  . History of blood transfusion   . Hyperlipidemia   . Hypertension   . Hypothyroidism   . ICD (implantable cardioverter-defibrillator) infection ([place])    removed 2016  . Implantable cardioverter-defibrillator-CRT- Mdt    a.  01/2013 s/p MDT [place] CRT D, ser # [id number] - ICD has been removed  . LBBB (left bundle branch block)   . Obstructive sleep apnea    uses cpap  . PAF (paroxysmal atrial fibrillation) ([place])    a. 07/2012 s/p TEE/DCCV;  b. chronic coumadin;  c. 05/2013 Recurrent Afib->TEE/DCCV and amio initiation.  . Peripheral vascular disease ([place])   . Pneumonia 07/2005  . Pulmonary embolism ([place])    a. 07/2005 after total right hip arthroplasty  . Tobacco abuse    a. discontinued in 1997, and then resumed  . Type II diabetes mellitus ([place])    type 2  . Urinary incontinence   . Villous adenoma of colon    a. tubovillous adenomatous polyp with focal high grade dysplasia; presented with hematochezia - followed by Dr. Laural Golden.    Past Surgical History:  Procedure Laterality Date  . A-V CARDIAC PACEMAKER INSERTION  12/05   Biventricular pacemaker/AICD  . ABDOMINAL HYSTERECTOMY  1990/92   Initial partial hysterectomy followed by BSO  . BASCILIC VEIN TRANSPOSITION Right 03/05/2017   Procedure: RIGHT 1ST STAGE BASCILIC VEIN TRANSPOSITION;  Surgeon: Serafina Mitchell, MD;  Location: [facility];  Service: Vascular;  Laterality: Right;  . BASCILIC VEIN TRANSPOSITION Right 05/28/2017   Procedure: BASILIC VEIN TRANSPOSITION SECOND STAGE RIGHT;  Surgeon: Serafina Mitchell, MD;  [facility];  Service: Vascular;  Laterality: Right;  . BI-VENTRICULAR IMPLANTABLE CARDIOVERTER DEFIBRILLATOR N/A 01/24/2013   Procedure: BI-VENTRICULAR IMPLANTABLE CARDIOVERTER DEFIBRILLATOR  (CRT-D);   Surgeon: Evans Lance, MD;  Location: [facility] CATH LAB;  Service: Cardiovascular;  Laterality: N/A;  . BI-VENTRICULAR IMPLANTABLE CARDIOVERTER DEFIBRILLATOR  (CRT-D)  01/24/2013  . CARDIAC CATHETERIZATION    . CARDIOVERSION N/A 08/20/2012   Procedure: TEE GUIDED CARDIOVERSION;  Surgeon: Yehuda Savannah, MD;  Location: [facility];  Service: Cardiovascular;  Laterality: N/A;  To be done @ bedside  . CARDIOVERSION N/A 06/20/2013   Procedure: CARDIOVERSION;  Surgeon: Dorothy Spark, MD;  Location: [place];  Service: Cardiovascular;  Laterality: N/A;  . CATARACT EXTRACTION W/PHACO Left 08/07/2015   Procedure: CATARACT EXTRACTION PHACO AND INTRAOCULAR LENS PLACEMENT ([place]);  Surgeon: Rutherford Guys, MD;  Location: [facility];  Service: Ophthalmology;  Laterality: Left;  CDE: 7.88  . CATARACT EXTRACTION W/PHACO Right 08/21/2015   Procedure: CATARACT EXTRACTION PHACO AND INTRAOCULAR LENS PLACEMENT (IOC);  Surgeon: Rutherford Guys, MD;  Location: [facility];  Service: Ophthalmology;  Laterality: Right;  CDE:8.55  . COLONOSCOPY Left 10/24/2014   Procedure: COLONOSCOPY;  Surgeon: Carol Ada, MD;  Location: [facility] ENDOSCOPY;  Service: Endoscopy;  Laterality: Left;  . COLONOSCOPY W/ POLYPECTOMY  2009  . COLONOSCOPY WITH ESOPHAGOGASTRODUODENOSCOPY (EGD) N/A 06/10/2013   Procedure: COLONOSCOPY WITH ESOPHAGOGASTRODUODENOSCOPY (EGD);  Surgeon: Rogene Houston, MD;  [facility];  Service: Endoscopy;  Laterality: N/A;  925  . ESOPHAGOGASTRODUODENOSCOPY N/A 10/21/2014   Procedure: ESOPHAGOGASTRODUODENOSCOPY (EGD);  Surgeon: Inda Castle, MD;  Location: [place];  Service: Endoscopy;  Laterality: N/A;  . GIVENS CAPSULE STUDY N/A 10/24/2014   Procedure: GIVENS CAPSULE STUDY;  Surgeon: Carol Ada, MD;  Location: [place];  Service: Endoscopy;  Laterality: N/A;  . ICD LEAD REMOVAL N/A 12/13/2014   Procedure: ICD LEAD REMOVAL/EXTRACTION ;  Surgeon: Evans Lance, MD;  Location: [place];  Service: Cardiovascular;   Laterality: N/A;  [place] back up  . KNEE ARTHROSCOPY Right 1980's?  Marland Kitchen PACEMAKER REMOVAL  11/18/05   Enterococcal infection  . RIGHT HEART CATHETERIZATION N/A 04/18/2014   Procedure: RIGHT HEART CATH;  Surgeon: Larey Dresser, MD;  Location: [facility] CATH LAB;  Service: Cardiovascular;  Laterality: N/A;  . TEE WITHOUT CARDIOVERSION N/A 08/20/2012   Procedure: TRANSESOPHAGEAL ECHOCARDIOGRAM (TEE);  Surgeon: Yehuda Savannah, MD;  [facility];  Service: Cardiovascular;  Laterality: N/A;  . TEE WITHOUT CARDIOVERSION N/A 06/20/2013   Procedure: TRANSESOPHAGEAL ECHOCARDIOGRAM (TEE);  Surgeon: Dorothy Spark, MD;  Location: [facility];  Service: Cardiovascular;  Laterality: N/A;  . TEE WITHOUT CARDIOVERSION N/A 12/08/2014   Procedure: TRANSESOPHAGEAL ECHOCARDIOGRAM (TEE);  Surgeon: Fay Records, MD;  [facility];  Service: Cardiovascular;  Laterality: N/A;  . TOTAL HIP ARTHROPLASTY Right 07/2005  . TUBAL LIGATION  1980's    Family History  Problem Relation Age of Onset  . Hypertension Mother   . Diabetes Mother   . Coronary artery disease Father   . Diabetes Brother   . Hypertension Brother   . Lung cancer Brother   .  Arthritis Other   . Diabetes Other   . Heart disease Other        female < 77    Social History:  reports that she has quit smoking. Her smoking use included cigarettes. She quit after 12.00 years of use. she has never used smokeless tobacco. She reports that she does not drink alcohol or use drugs.    Allergies: No Known Allergies    Medications Prior to Admission  Medication Sig Dispense Refill  . amiodarone (PACERONE) 200 MG tablet Take 100 mg by mouth daily.     . calcitRIOL (ROCALTROL) 0.25 MCG capsule Take 0.25 mcg by mouth daily.    . carvedilol (COREG) 6.25 MG tablet TAKE 1 TABLET BY MOUTH TWICE DAILY WITH A MEAL. 60 tablet 0  . colchicine 0.6 MG tablet Take 0.6 mg by mouth 2 (two) times daily as needed (gout). Reported on 11/06/2015    . feeding  supplement, ENSURE ENLIVE, (ENSURE ENLIVE) LIQD Take 237 mLs by mouth 2 (two) times daily between meals. (Patient taking differently: Take 237 mLs by mouth daily with lunch. ) 237 mL 12  . ferrous sulfate 324 (65 FE) MG TBEC Take 1 tablet (325 mg total) by mouth 2 (two) times daily. 60 tablet   . furosemide (LASIX) 40 MG tablet TAKE 1 TABLET BY MOUTH TWICE DAILY. 60 tablet 3  . glimepiride (AMARYL) 2 MG tablet Take 2 mg by mouth daily with breakfast.     . guaiFENesin (MUCINEX) 600 MG 12 hr tablet Take 1 tablet (600 mg total) by mouth 2 (two) times daily. 10 tablet 0  . guaiFENesin-dextromethorphan (ROBITUSSIN DM) 100-10 MG/5ML syrup Take 5 mLs by mouth every 4 (four) hours as needed for cough. 118 mL 0  . isosorbide mononitrate (IMDUR) 30 MG 24 hr tablet Take 1 tablet (30 mg total) by mouth daily. 30 tablet 0  . levothyroxine (SYNTHROID, LEVOTHROID) 50 MCG tablet Take 50 mcg by mouth daily before breakfast.    . lisinopril (PRINIVIL,ZESTRIL) 10 MG tablet Take 1 tablet (10 mg total) by mouth daily. (Patient taking differently: Take 5 mg by mouth daily. ) 30 tablet 0  . metoCLOPramide (REGLAN) 5 MG tablet Take 5 mg by mouth 4 (four) times daily.    . ondansetron (ZOFRAN) 4 MG tablet Take 4 mg by mouth every 4 (four) hours as needed for nausea or vomiting. Reported on 11/06/2015    . oxyCODONE-acetaminophen (PERCOCET) 10-325 MG per tablet Take 1 tablet by mouth every 6 (six) hours as needed for pain.     . polyethylene glycol powder (GLYCOLAX/MIRALAX) powder Take 17 g by mouth daily. 255 g 0  . pravastatin (PRAVACHOL) 40 MG tablet Take 80 mg by mouth at bedtime.     . senna-docusate (SENOKOT-S) 8.6-50 MG tablet Take 2 tablets by mouth daily as needed for mild constipation. 15 tablet 0  . spironolactone (ALDACTONE) 25 MG tablet TAKE 1/2 TABLET BY MOUTH DAILY. 45 tablet 2  . warfarin (COUMADIN) 2.5 MG tablet Take 0.5-1 tablets (1.25-2.5 mg total) by mouth daily at 6 PM. Takes 1 tablet on Mon, Wed, Fri,  take 0.5 tablet on all other days (Patient taking differently: Take 1.25-2.5 mg by mouth daily at 6 PM. Take 0.5 tablet (1.25 mg) by mouth on Monday, Wednesday, & Friday then Take 1 tablet (2.5 mg) on all other days Sunday, Tuesday, Thursday, & Saturday)      Home: Home Living Family/patient expects to be discharged to:: Private residence Living Arrangements: Spouse/significant  other Available Help at Discharge: Family, Available 24 hours/day Type of Home: House Home Access: Stairs to enter [employer] of Steps: 3 Entrance Stairs-Rails: Left, Right, Can reach both Home Layout: One level Bathroom Shower/Tub: (sponge baths) Bathroom Toilet: Standard Bathroom Accessibility: Yes Home Equipment: Environmental consultant - 4 wheels, Cane - single point, Wheelchair - manual, Bedside commode, Shower seat, Risk analyst History: Prior Function Level of Independence: Needs assistance Gait / Transfers Assistance Needed: Continued increased weakness since D/C from hospital on 06/30/17 requiring A of RW and A of husband or daughter ADL's / Homemaking Assistance Needed: Dtr or husband does LB ADLs, pt helps with UB ADLs Functional Status:  Mobility: Bed Mobility Overal bed mobility: Needs Assistance Bed Mobility: Supine to Sit Rolling: Mod assist, Max assist Supine to sit: Mod assist Sit to supine: Mod assist General bed mobility comments: patient requiring Mod assist for bed transfers today due to back pain, fatigue  Transfers Overall transfer level: Needs assistance Equipment used: Rolling walker (2 wheeled) Transfers: Sit to/from Stand Sit to Stand: Mod assist General transfer comment: cues for hand placement, assist to power up; noted severe posterior lean against bed in standing today, Max assist to maintain standing for approximately 5 seconds before sitting back down with PT assist for safe lower  Ambulation/Gait Ambulation/Gait assistance: Min guard Ambulation Distance (Feet):  50 Feet Assistive device: Rolling walker (2 wheeled) Gait Pattern/deviations: Step-through pattern, Decreased stride length General Gait Details: DNT, unable to safely do so  Gait velocity: decreased Gait velocity interpretation: Below normal speed for age/gender    ADL: ADL Overall ADL's : Needs assistance/impaired Eating/Feeding: Independent, Sitting Grooming: Minimal assistance, Bed level(HOB elevated) Upper Body Bathing: Minimal assistance, Sitting Lower Body Bathing: Maximal assistance Lower Body Bathing Details (indicate cue type and reason): min A sit<>stand Upper Body Dressing : Minimal assistance, Sitting Lower Body Dressing: Total assistance Lower Body Dressing Details (indicate cue type and reason): min A sit<>stand Toilet Transfer: Minimal assistance, Stand-pivot, BSC Toileting- Clothing Manipulation and Hygiene: Maximal assistance Toileting - Clothing Manipulation Details (indicate cue type and reason): min A sit<>stand General ADL Comments: Pt seen for ADL retraining session today with focus on bed mobility(Mod-max A) and sitting up EOB for ~50min before pt stating "I need to get back in bed". Pt was Min-Mod A sitting EOB as she was noted to exhibit posterior lean and lift her feet off the floor and scoot to EOB. She was able to scoot back onto bed with +1 assist and mod vc's. Discussed possibilitiy of SNF Rehab w/ pt/spouse and they were agreeable to this today. Pt husband states that he may not be able to care for pt independently at d/c "Unless she can get well and get around better".  Verbally reivewed acute OT goals w/ pt/husband.  Cognition: Cognition Overall Cognitive Status: Within Functional Limits for tasks assessed Orientation Level: Oriented X4 Cognition Arousal/Alertness: Awake/alert Behavior During Therapy: WFL for tasks assessed/performed, Flat affect Overall Cognitive Status: Within Functional Limits for tasks assessed  Blood pressure (!) 90/55, pulse 70,  temperature 97.9 F (36.6 C), temperature source Oral, resp. rate 18, height 5\' 5"  (1.651 m), weight 90.2 kg (198 lb 13.7 oz), SpO2 98 %. Physical Exam  Vitals reviewed. Constitutional: She is oriented to person, place, and time. She appears well-developed and well-nourished.  HENT:  Head: Normocephalic and atraumatic.  Eyes: EOM are normal. Right eye exhibits no discharge. Left eye exhibits no discharge.  Neck: Normal range of motion. Neck supple.  Cardiovascular:  Irregularly irregular  Respiratory: Effort normal and breath sounds normal.  GI: Soft. Bowel sounds are normal.  Musculoskeletal:  No edema or tenderness in extremities  Neurological: She is alert and oriented to person, place, and time.  Sensation intact to light touch Motor: B/l UE 4-/5 proximal to distal B/l LE: HF 3+/5, KE 4-/5, ADF/PF 4+/5  Skin: Skin is warm and dry.  Psychiatric: She has a normal mood and affect. Her behavior is normal.    Results for orders placed or performed during the hospital encounter of 07/02/17 (from the past 24 hour(s))  Glucose, capillary     Status: Abnormal   Collection Time: 07/06/17 11:47 AM  Result Value Ref Range   Glucose-Capillary 202 (H) 65 - 99 mg/dL   Comment 1 Notify RN   CBC     Status: Abnormal   Collection Time: 07/06/17  2:30 PM  Result Value Ref Range   WBC 11.6 (H) 4.0 - 10.5 K/uL   RBC 3.62 (L) 3.87 - 5.11 MIL/uL   Hemoglobin 11.3 (L) 12.0 - 15.0 g/dL   HCT 34.7 (L) 36.0 - 46.0 %   MCV 95.9 78.0 - 100.0 fL   MCH 31.2 26.0 - 34.0 pg   MCHC 32.6 30.0 - 36.0 g/dL   RDW 16.4 (H) 11.5 - 15.5 %   Platelets 217 150 - 400 K/uL  Hepatitis B surface antigen     Status: None   Collection Time: 07/06/17  2:30 PM  Result Value Ref Range   Hepatitis B Surface Ag Negative Negative  Hepatitis B core antibody, total     Status: None   Collection Time: 07/06/17  2:30 PM  Result Value Ref Range   Hep B Core Total Ab Negative Negative  ALT     Status: Abnormal   Collection  Time: 07/06/17  2:30 PM  Result Value Ref Range   ALT 61 (H) 14 - 54 U/L  Glucose, capillary     Status: Abnormal   Collection Time: 07/06/17  9:34 PM  Result Value Ref Range   Glucose-Capillary 151 (H) 65 - 99 mg/dL  Basic metabolic panel     Status: Abnormal   Collection Time: 07/07/17  6:23 AM  Result Value Ref Range   Sodium 132 (L) 135 - 145 mmol/L   Potassium 4.3 3.5 - 5.1 mmol/L   Chloride 98 (L) 101 - 111 mmol/L   CO2 22 22 - 32 mmol/L   Glucose, Bld 131 (H) 65 - 99 mg/dL   BUN 96 (H) 6 - 20 mg/dL   Creatinine, Ser 2.38 (H) 0.44 - 1.00 mg/dL   Calcium 9.4 8.9 - 10.3 mg/dL   GFR calc non Af Amer 19 (L) >60 mL/min   GFR calc Af Amer 22 (L) >60 mL/min   Anion gap 12 5 - 15  Protime-INR     Status: Abnormal   Collection Time: 07/07/17  6:23 AM  Result Value Ref Range   Prothrombin Time 21.2 (H) 11.4 - 15.2 seconds   INR 1.86   Glucose, capillary     Status: Abnormal   Collection Time: 07/07/17  7:20 AM  Result Value Ref Range   Glucose-Capillary 128 (H) 65 - 99 mg/dL   Comment 1 Notify RN    No results found.  Assessment/Plan: Diagnosis: Debility Labs independently reviewed.  Records reviewed and summated above.  1. Does the need for close, 24 hr/day medical supervision in concert with the patient's rehab needs make it unreasonable for this  patient to be served in a less intensive setting? Potentially  2. Co-Morbidities requiring supervision/potential complications: PAF (cont meds, monitor HR with increased mobility, plan for DCCV), OSA (monitor for daytime somnolence and fatigue), COPD (monitor RR and O2 sats with increased exertion), NICM s/p ICD, CKD Stage IV (avoid nephrotoxic meds), HD dependent (recs per Neprho), hypotension (cont meds), DM (Monitor in accordance with exercise and adjust meds as necessary), hypothyroidism (cont meds, ensure appropriate mood and energy level for therapies), leukocytosis (cont to monitor for signs and symptoms of infection, further  workup if indicated), ABLA (transfuse if necessary to ensure appropriate perfusion for increased activity tolerance) 3. Due to bladder management, safety, skin/wound care, disease management and patient education, does the patient require 24 hr/day rehab nursing? Yes 4. Does the patient require coordinated care of a physician, rehab nurse, PT (1-2 hrs/day, 5 days/week) and OT (1-2 hrs/day, 5 days/week) to address physical and functional deficits in the context of the above medical diagnosis(es)? Yes Addressing deficits in the following areas: balance, endurance, locomotion, strength, transferring, bathing, dressing, toileting and psychosocial support 5. Can the patient actively participate in an intensive therapy program of at least 3 hrs of therapy per day at least 5 days per week? No 6. The potential for patient to make measurable gains while on inpatient rehab is NA 7. Anticipated functional outcomes upon discharge from inpatient rehab are n/a  with PT, n/a with OT, n/a with SLP. 8. Estimated rehab length of stay to reach the above functional goals is: NA 9. Anticipated D/C setting: SNF 10. Anticipated post D/C treatments: SNF 11. Overall Rehab/Functional Prognosis: good  RECOMMENDATIONS: This patient's condition is appropriate for continued rehabilitative care in the following setting: Pt unable to tolerate 3 hours of therapy at this time.  Recommend SNF when medically stable. Patient has agreed to participate in recommended program. Potentially Note that insurance prior authorization may be required for reimbursement for recommended care.  Comment: Rehab Admissions Coordinator to follow up.  Delice Lesch, MD, ABPMR Bary Leriche, PA-C 07/07/2017

## 2017-07-07 NOTE — Progress Notes (Signed)
  [facility] Progress Note   Assessment/ Plan:   1. Acute on chronic systolic CHF: pt vol overloaded with progressive CKD--> to start dialysis.  HD started 07/06/17.  CLIP in process On Coreg and Imdur 2. 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  [facility] has been by to eval fistula- OK to keep sticking, duplex pending 3. Anemia: Hgb 11.4, will start ESA when appropriate 4. CKD-MBD: check PTH, on calcitriol 0.25 mcg TIW 5. Nutrition: Albumin 3.6 6. Hypertension: as above in #1 7.  Afib: on warfarin and amiodarone 8: Dispo: pending, for SNF  Subjective:    Tolerated HD yesterday.  For HD #2 today.  Pt reports feeling "a little" bit better.  Fistula used successfully yesterday.     Objective:   BP 92/62 (BP Location: Left Arm)   Pulse 76   Temp 97.8 F (36.6 C) (Oral)   Resp 18   Ht 5\' 5"  (1.651 m)   Wt 90.2 kg (198 lb 13.7 oz)   SpO2 97%   BMI 33.09 kg/m   Physical Exam: Gen: older woman, NAD, sitting up and eating lunch.   CVS: RRR soft systolic murmur Resp: muffled bilaterally Abd: soft nontender NABS Ext:1+ LE edema ACCESS: RUE AVF + T/B, tortuous, dressed.    Labs: BMET Recent Labs  Lab 07/02/17 1131 07/03/17 0501 07/04/17 0349 07/05/17 0844 07/06/17 0624 07/07/17 0623  NA 135 134* 129* 128* 130* 132*  K 4.4 4.5 4.7 5.4* 5.0 4.3  CL 99* 99* 99* 97* 98* 98*  CO2 17* 19* 19* 15* 18* 22  GLUCOSE 189* 241* 208* 292* 156* 131*  BUN 109* 115* 128* 148* 158* 96*  CREATININE 4.11* 4.14* 3.93* 3.53* 3.38* 2.38*  CALCIUM 9.5 9.3 9.4 9.5 9.9 9.4   CBC Recent Labs  Lab 07/02/17 1131 07/03/17 0501 07/05/17 0844 07/06/17 1430  WBC 7.4 4.3 8.8 11.6*  NEUTROABS 5.8  --   --   --   HGB 11.1* 10.6* 11.4* 11.3*  HCT 35.5* 33.9* 35.6* 34.7*  MCV 98.6 97.4 97.8 95.9  PLT 242 207 203 217    @IMGRELPRIORS @ Medications:    . amiodarone  200 mg Oral Daily  . calcitRIOL  0.25 mcg Oral Daily  . feeding supplement (ENSURE ENLIVE)  237 mL Oral TID BM  . ferrous sulfate   325 mg Oral BID  . insulin aspart  0-20 Units Subcutaneous TID WC  . insulin aspart  0-5 Units Subcutaneous QHS  . insulin aspart  9 Units Subcutaneous TID WC  . insulin glargine  14 Units Subcutaneous Daily  . isosorbide mononitrate  30 mg Oral Daily  . levothyroxine  50 mcg Oral QAC breakfast  . multivitamin with minerals  1 tablet Oral Daily  . polyethylene glycol  17 g Oral Daily  . pravastatin  80 mg Oral QHS  . sodium chloride flush  3 mL Intravenous Q12H  . Warfarin - Pharmacist Dosing Inpatient   Does not apply [place], MD [place] pgr [phone] 07/07/2017, 1:06 PM

## 2017-07-07 NOTE — Progress Notes (Deleted)
Thank you for consult on [name]. Progress notes and therapy notes from yesterday reviewed. Note that PT on hold due to soft blood pressures and she is having poor tolerance of HD. Doubt that patient will be able to tolerate CIR level therapy at this time and agree with recommendations of SNF for follow up therapy.

## 2017-07-07 NOTE — Progress Notes (Signed)
Dialysis called for report. Patient on her way with transport. Report given.

## 2017-07-07 NOTE — Progress Notes (Signed)
ANTICOAGULATION CONSULT NOTE - Follow Up Consult  Pharmacy Consult for warfarin Indication: atrial fibrillation and VTE prophylaxis  No Known Allergies  Patient Measurements: Height: 5\' 5"  (165.1 cm) Weight: 198 lb 13.7 oz (90.2 kg) IBW/kg (Calculated) : 57   Vital Signs: Temp: 97.8 F (36.6 C) (01/15 1149) Temp Source: Oral (01/15 1149) BP: 92/62 (01/15 1149) Pulse Rate: 76 (01/15 1149)  Labs: Recent Labs    07/05/17 0844 07/06/17 0624 07/06/17 1430 07/07/17 0623  HGB 11.4*  --  11.3*  --   HCT 35.6*  --  34.7*  --   PLT 203  --  217  --   LABPROT 22.7* 22.0*  --  21.2*  INR 2.02 1.94  --  1.86  CREATININE 3.53* 3.38*  --  2.38*    Estimated Creatinine Clearance: 23.4 mL/min (A) (by C-G formula based on SCr of 2.38 mg/dL (H)).  Medications:  Scheduled:  . amiodarone  200 mg Oral Daily  . calcitRIOL  0.25 mcg Oral Daily  . feeding supplement (ENSURE ENLIVE)  237 mL Oral TID BM  . ferrous sulfate  325 mg Oral BID  . insulin aspart  0-20 Units Subcutaneous TID WC  . insulin aspart  0-5 Units Subcutaneous QHS  . insulin aspart  9 Units Subcutaneous TID WC  . insulin glargine  14 Units Subcutaneous Daily  . isosorbide mononitrate  30 mg Oral Daily  . levothyroxine  50 mcg Oral QAC breakfast  . multivitamin with minerals  1 tablet Oral Daily  . polyethylene glycol  17 g Oral Daily  . pravastatin  80 mg Oral QHS  . sodium chloride flush  3 mL Intravenous Q12H  . warfarin  4 mg Oral ONCE-1800  . Warfarin - Pharmacist Dosing Inpatient   Does not apply q1800    Assessment: 72 y.o female presented with SOB and recent admit on 1/4 for CHF exacerbation.  On warfarin for afib prior to admission with therapeutic INR 3 upon admission.   Home regimen: 1.25 mg on MWF and 2.5 mg all other days (weekly dose 13.75mg ).  INR trended down from 3.1 to 2.02 after holding dose 1/11.  INR still trending down today despite several doses above her home dose.  Amiodarone dose slightly  higher than home dose.  No overt bleeding or complications noted.  Awaiting SNF placement  Goal of Therapy:  INR 2-3 Monitor platelets by anticoagulation protocol: Yes   Plan:  Warfarin 4 mg x1 tonight. Monitor daily INR, CBC, signs/symptoms of bleeding  Thank you for allowing [place] to participate in this patient's care.  Uvaldo Rising, BCPS  Clinical Pharmacist Pager [phone]  07/07/2017 3:06 PM

## 2017-07-07 NOTE — Progress Notes (Signed)
PROGRESS NOTE  [name] JOA:[id number] DOB: 05-Apr-1946 DOA: 07/02/2017 PCP: Lemmie Evens, MD  HPI/Recap of past 24 hours: HPI from Dr Randa Spike on 07/02/17  [name] is a 72 y.o. female with medical history significant of NICM with EF of 20% s/p ICD, A. 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 Since discharge from the hospital she has done poorly according to her son and her husband.  She presented to our ER with worsening shortness of breath and generalized fatigue. In the ED, she was given a nebulizer treatment and seemed to improve some with a dose of steroid. Pt admitted for further management.  Today, patient reported feeling about the same, denied any worsening shortness of breath, chest pain, dizziness, fever/chills.  Reported having generalized weakness.  Assessment/Plan: Principal Problem:   COPD (chronic obstructive pulmonary disease) (HCC) Active Problems:   Dyslipidemia   HTN (hypertension)   History of pulmonary embolism   Type 2 diabetes, uncontrolled, with renal manifestation (HCC)   Chronic renal disease, stage 4, severely decreased glomerular filtration rate (GFR) between 15-29 mL/min/1.73 square meter (HCC)   Chronic anticoagulation   Atrial fibrillation (HCC)   Implantable cardioverter-defibrillator-CRT- Mdt   Anemia in chronic renal disease   Hypothyroidism   SOB (shortness of breath)   Acute on chronic systolic CHF (congestive heart failure), NYHA class 4 (HCC)   Palliative care by specialist   Goals of care, counseling/discussion   Generalized weakness   Nonischemic cardiomyopathy ([place])   Hyperglycemia   Dependence on renal dialysis ([place])   Leukocytosis   Acute blood loss  anemia  Acute on chronic systolic HF New York Heart Association class IV Decompensated, BNP 2179, CXR as above ECHO EF 20-25%, s/p ICD X 2, removed due to infection  Cardiology on board: Stopped IV  lasix 120mg  BID Held lisinopril, coreg, continue imdur Strict I&O, daily weight  Acute on CKD stage IV Vs ESRD Likely Uremic, new HD pt Cr elevated to 4, baseline around 3.5 Held lisinopril, avoid nephrotoxins and monitor closely Nephrology consulted: Started HD on 07/06/17 via AVF  COPD exacerbation SOB likely multifactorial due to CHF, uremia No cough, afebrile, no leukocytosis CXR: Mild cardiomegaly, no vas congestion Continue duonebs, discontinue solumedrol PRN O2   Chronic Afib Rate controlled, still in Afib Continue coumadin, coreg, amiodarone Plan for TEE-guided DCCV once vol overload is controlled   Type 2 diabetes A1c 6.6 SSI, lantus, TID aspart Hypoglycemic protocol  Anemia of chronic renal disease Continue PO iron  Dyslipidemia Continue pravastatin  Hypertension BP soft, held lisinopril, coreg Continue imdur  Hypothyroidism Continue levothyroxine  Goals of care discussion Palliative consulted, husband wants full code and everything done including dialysis   Code Status:  Full  Family Communication: None at bedside  Disposition Plan: Recommend SNF   Consultants:  Cardiology/HF  Nephrology  Palliative  Vascular  Procedures:  None  Antimicrobials:  None  DVT prophylaxis:  Coumadin   Objective: Vitals:   07/06/17 2100 07/07/17 0328 07/07/17 0810 07/07/17 1149  BP: (!) 104/59 (!) 90/54 (!) 90/55 92/62  Pulse: 73 70 70 76  Resp: 18 18  18   Temp: 97.7 F (36.5 C) 97.9 F (36.6 C)  97.8 F (36.6 C)  TempSrc: Oral Oral  Oral  SpO2: 96% 98%  97%  Weight:  90.2 kg (198 lb 13.7 oz)    Height:        Intake/Output Summary (Last 24 hours) at 07/07/2017 1658 Last data filed at 07/07/2017 0924 Gross per 24 hour  Intake 724  ml  Output 800 ml  Net -76 ml   Filed Weights   07/06/17 1420 07/06/17 1658 07/07/17 0328  Weight: 88.4 kg (194 lb 14.2 oz) 88.4 kg (194 lb 14.2 oz) 90.2 kg (198 lb 13.7 oz)    Exam:   General: Alert, awake, NAD  Cardiovascular: Irregular rate and rhythm, SEM  Respiratory: Bibasilar crackles  Abdomen:  Soft, non-tender, non-distended, BS present  Musculoskeletal: +1 bilateral pedal edema  Skin: Normal  Psychiatry: Normal mood   Data Reviewed: CBC: Recent Labs  Lab 07/02/17 1131 07/03/17 0501 07/05/17 0844 07/06/17 1430  WBC 7.4 4.3 8.8 11.6*  NEUTROABS 5.8  --   --   --   HGB 11.1* 10.6* 11.4* 11.3*  HCT 35.5* 33.9* 35.6* 34.7*  MCV 98.6 97.4 97.8 95.9  PLT 242 207 203 301   Basic Metabolic Panel: Recent Labs  Lab 07/03/17 0501 07/04/17 0349 07/05/17 0844 07/06/17 0624 07/07/17 0623  NA 134* 129* 128* 130* 132*  K 4.5 4.7 5.4* 5.0 4.3  CL 99* 99* 97* 98* 98*  CO2 19* 19* 15* 18* 22  GLUCOSE 241* 208* 292* 156* 131*  BUN 115* 128* 148* 158* 96*  CREATININE 4.14* 3.93* 3.53* 3.38* 2.38*  CALCIUM 9.3 9.4 9.5 9.9 9.4  MG 3.3*  --   --   --   --    GFR: Estimated Creatinine Clearance: 23.4 mL/min (A) (by C-G formula based on SCr of 2.38 mg/dL (H)). Liver Function Tests: Recent Labs  Lab 07/02/17 1131 07/06/17 1430  AST 36  --   ALT 160* 61*  ALKPHOS 52  --   BILITOT 1.1  --   PROT 6.8  --   ALBUMIN 3.6  --    No results for input(s): LIPASE, AMYLASE in the last 168 hours. No results for input(s): AMMONIA in the last 168 hours. Coagulation Profile: Recent Labs  Lab 07/03/17 0501 07/04/17 0349 07/05/17 0844 07/06/17 0624 07/07/17 0623  INR 3.37 3.10 2.02 1.94 1.86   Cardiac Enzymes: No results for input(s): CKTOTAL, CKMB, CKMBINDEX, TROPONINI in the last 168 hours. BNP (last 3 results) No results for input(s): PROBNP in the last 8760 hours. HbA1C: No results for input(s): HGBA1C in the last 72 hours. CBG: Recent Labs  Lab  07/06/17 0755 07/06/17 1147 07/06/17 2134 07/07/17 0720 07/07/17 1143  GLUCAP 174* 202* 151* 128* 134*   Lipid Profile: No results for input(s): CHOL, HDL, LDLCALC, TRIG, CHOLHDL, LDLDIRECT in the last 72 hours. Thyroid Function Tests: No results for input(s): TSH, T4TOTAL, FREET4, T3FREE, THYROIDAB in the last 72 hours. Anemia Panel: No results for input(s): VITAMINB12, FOLATE, FERRITIN, TIBC, IRON, RETICCTPCT in the last 72 hours. Urine analysis:    Component Value Date/Time   COLORURINE YELLOW 07/02/2017 1123   APPEARANCEUR HAZY (A) 07/02/2017 1123   LABSPEC 1.013 07/02/2017 1123   PHURINE 5.0 07/02/2017 1123   GLUCOSEU NEGATIVE 07/02/2017 1123   HGBUR LARGE (A) 07/02/2017 1123   BILIRUBINUR NEGATIVE 07/02/2017 [place] 07/02/2017 1123   PROTEINUR NEGATIVE 07/02/2017 1123   UROBILINOGEN 0.2 01/08/2015 0940   NITRITE NEGATIVE 07/02/2017 1123   LEUKOCYTESUR NEGATIVE 07/02/2017 1123   Sepsis Labs: @LABRCNTIP (procalcitonin:4,lacticidven:4)  ) No results found for  this or any previous visit (from the past 240 hour(s)).    Studies: No results found.  Scheduled Meds: . amiodarone  200 mg Oral Daily  . calcitRIOL  0.25 mcg Oral Daily  . feeding supplement (ENSURE ENLIVE)  237 mL Oral TID BM  . ferrous sulfate  325 mg Oral BID  . insulin aspart  0-20 Units Subcutaneous TID WC  . insulin aspart  0-5 Units Subcutaneous QHS  . insulin aspart  9 Units Subcutaneous TID WC  . insulin glargine  14 Units Subcutaneous Daily  . isosorbide mononitrate  30 mg Oral Daily  . levothyroxine  50 mcg Oral QAC breakfast  . multivitamin with minerals  1 tablet Oral Daily  . polyethylene glycol  17 g Oral Daily  . pravastatin  80 mg Oral QHS  . sodium chloride flush  3 mL Intravenous Q12H  . warfarin  4 mg Oral ONCE-1800  . Warfarin - Pharmacist Dosing Inpatient   Does not apply q1800    Continuous Infusions: . sodium chloride       LOS: 5 days     Alma Friendly, MD [facility]   If 7PM-7AM, please contact night-coverage [email or web address] Password [facility] 07/07/2017, 4:58 PM

## 2017-07-07 NOTE — Progress Notes (Signed)
I met with pt at bedside and then contacted her spouse by phone. I discussed our recommendations for SNF, not CIR due to poor tolerance with therapies. He is in agreement to SNF. I have alerted RN CM. We will sign off. [phone]

## 2017-07-08 ENCOUNTER — Encounter (HOSPITAL_COMMUNITY): Payer: Medicare HMO

## 2017-07-08 LAB — CBC WITH DIFFERENTIAL/PLATELET
Basophils Absolute: 0 10*3/uL (ref 0.0–0.1)
Basophils Relative: 0 %
Eosinophils Absolute: 0.1 10*3/uL (ref 0.0–0.7)
Eosinophils Relative: 1 %
HEMATOCRIT: 38.6 % (ref 36.0–46.0)
HEMOGLOBIN: 12.4 g/dL (ref 12.0–15.0)
LYMPHS ABS: 1.5 10*3/uL (ref 0.7–4.0)
Lymphocytes Relative: 13 %
MCH: 31.6 pg (ref 26.0–34.0)
MCHC: 32.1 g/dL (ref 30.0–36.0)
MCV: 98.5 fL (ref 78.0–100.0)
Monocytes Absolute: 0.6 10*3/uL (ref 0.1–1.0)
Monocytes Relative: 5 %
NEUTROS ABS: 9 10*3/uL — AB (ref 1.7–7.7)
NEUTROS PCT: 81 %
Platelets: 193 10*3/uL (ref 150–400)
RBC: 3.92 MIL/uL (ref 3.87–5.11)
RDW: 16.9 % — ABNORMAL HIGH (ref 11.5–15.5)
WBC: 11.1 10*3/uL — ABNORMAL HIGH (ref 4.0–10.5)

## 2017-07-08 LAB — BASIC METABOLIC PANEL
Anion gap: 8 (ref 5–15)
BUN: 51 mg/dL — AB (ref 6–20)
CHLORIDE: 102 mmol/L (ref 101–111)
CO2: 27 mmol/L (ref 22–32)
CREATININE: 1.67 mg/dL — AB (ref 0.44–1.00)
Calcium: 8.8 mg/dL — ABNORMAL LOW (ref 8.9–10.3)
GFR calc Af Amer: 34 mL/min — ABNORMAL LOW (ref >60)
GFR calc non Af Amer: 29 mL/min — ABNORMAL LOW (ref >60)
GLUCOSE: 86 mg/dL (ref 65–99)
Potassium: 3.8 mmol/L (ref 3.5–5.1)
Sodium: 137 mmol/L (ref 135–145)

## 2017-07-08 LAB — HEPATITIS B E ANTIBODY: HEP B E AB: NEGATIVE

## 2017-07-08 LAB — GLUCOSE, CAPILLARY
GLUCOSE-CAPILLARY: 54 mg/dL — AB (ref 65–99)
GLUCOSE-CAPILLARY: 124 mg/dL — AB (ref 65–99)
Glucose-Capillary: 75 mg/dL (ref 65–99)
Glucose-Capillary: 150 mg/dL — ABNORMAL HIGH (ref 65–99)
Glucose-Capillary: 150 mg/dL — ABNORMAL HIGH (ref 65–99)
Glucose-Capillary: 42 mg/dL — CL (ref 65–99)

## 2017-07-08 LAB — PROTIME-INR
INR: 1.86
Prothrombin Time: 21.3 seconds — ABNORMAL HIGH (ref 11.4–15.2)

## 2017-07-08 MED ORDER — INSULIN ASPART 100 UNIT/ML ~~LOC~~ SOLN
3.0000 [IU] | Freq: Three times a day (TID) | SUBCUTANEOUS | Status: DC
  Administered 2017-07-09: 3 [IU] via SUBCUTANEOUS

## 2017-07-08 MED ORDER — SODIUM CHLORIDE 0.9 % IV SOLN: 250.0000 mL | INTRAVENOUS | Status: DC

## 2017-07-08 MED ORDER — MIDODRINE HCL 5 MG PO TABS
5.0000 mg | ORAL_TABLET | ORAL | Status: DC | PRN
  Administered 2017-07-09 – 2017-07-14 (×3): 5 mg via ORAL
  Filled 2017-07-08: qty 1

## 2017-07-08 MED ORDER — INSULIN GLARGINE 100 UNIT/ML ~~LOC~~ SOLN
7.0000 [IU] | Freq: Every day | SUBCUTANEOUS | Status: DC
  Administered 2017-07-09: 7 [IU] via SUBCUTANEOUS
  Filled 2017-07-08: qty 0.07

## 2017-07-08 MED ORDER — WARFARIN SODIUM 2 MG PO TABS
4.0000 mg | ORAL_TABLET | Freq: Once | ORAL | Status: AC
  Administered 2017-07-08: 4 mg via ORAL
  Filled 2017-07-08: qty 2

## 2017-07-08 MED ORDER — SODIUM CHLORIDE 0.9% FLUSH: 3.0000 mL | INTRAVENOUS | Status: DC | PRN

## 2017-07-08 MED ORDER — SODIUM CHLORIDE 0.9% FLUSH
3.0000 mL | Freq: Two times a day (BID) | INTRAVENOUS | Status: DC
  Administered 2017-07-08 – 2017-07-14 (×10): 3 mL via INTRAVENOUS

## 2017-07-08 NOTE — Progress Notes (Signed)
PT Cancellation Note  Patient Details Name: [name] MRN: [id number] DOB: 1945-10-31   Cancelled Treatment:    Reason Eval/Treat Not Completed: Fatigue/lethargy limiting ability to participate, attempted to see patient, she just finished with OT therapies and remains fatigued at this time. Spoke with patient an will re attempted this afternoon  ~2:30 pm.    Duncan Dull 07/08/2017, 11:29 AM

## 2017-07-08 NOTE — Progress Notes (Signed)
PT Cancellation Note  Patient Details Name: ANJULIE DIPIERRO MRN: [id number] DOB: 20-Jul-1945   Cancelled Treatment:    Reason Eval/Treat Not Completed: Other (comment), patient asleep upon therapists arrival, patient remains limited by fatigue. Will re-attempt in am.   Duncan Dull 07/08/2017, 3:11 PM Alben Deeds, PT DPT  Board Certified Neurologic Specialist [phone]

## 2017-07-08 NOTE — Progress Notes (Signed)
Occupational Therapy Treatment Patient Details Name: [name] MRN: [id number] DOB: 03-10-1946 Today's Date: 07/08/2017    History of present illness [name] is a 72 y.o. female PHMx: nonischemic cardiomyopathy with EF of 20%, A. fib, DM2, CKD stage IV, anemia of CKD, obstructive sleep apnea on CPAP, dyslipidemia and obesity presented to the ED with increasing shortness of breath since discharge from the hospital on June 30, 2017. Found to have COPD exacerbation and possibly worsening congestive heart failure    OT comments  Pt with fatigue, but immediately agreeable to sitting at EOB and standing. Min assist needed for bed mobility and min guard to stand from elevated bed and take several steps to [facility]. Progressing. Continues to be appropriate for SNF level rehab.  Follow Up Recommendations  SNF;Supervision/Assistance - 24 hour    Equipment Recommendations       Recommendations for Other Services      Precautions / Restrictions Precautions Precautions: Fall       Mobility Bed Mobility   Bed Mobility: Supine to Sit;Sit to Supine     Supine to sit: Min assist Sit to supine: Min assist   General bed mobility comments: min assist to raise trunk and for LEs back into bed, increased time  Transfers Overall transfer level: Needs assistance Equipment used: Rolling walker (2 wheeled) Transfers: Sit to/from Stand Sit to Stand: Min guard;From elevated surface         General transfer comment: min guard for safety, cues for hand placement    Balance Overall balance assessment: Needs assistance   Sitting balance-Leahy Scale: Fair Sitting balance - Comments: no assist needed, sat x 10 minutes   Standing balance support: Bilateral upper extremity supported Standing balance-Leahy Scale: Poor Standing balance comment: relies on walker and min guard assist, took several steps to [facility]                           ADL either performed or assessed with clinical  judgement   ADL                                               Vision       Perception     Praxis      Cognition Arousal/Alertness: Awake/alert Behavior During Therapy: Flat affect Overall Cognitive Status: Within Functional Limits for tasks assessed                                          Exercises     Shoulder Instructions       General Comments      Pertinent Vitals/ Pain       Pain Assessment: No/denies pain  Home Living                                          Prior Functioning/Environment              Frequency  Min 2X/week        Progress Toward Goals  OT Goals(current goals can now be found in the care plan section)  Progress towards OT goals: Not progressing  toward goals - comment(fatigue)  Acute Rehab OT Goals Patient Stated Goal: home OT Goal Formulation: With patient Time For Goal Achievement: 07/17/17 Potential to Achieve Goals: Good  Plan Discharge plan remains appropriate    Co-evaluation                 AM-PAC PT "6 Clicks" Daily Activity     Outcome Measure   Help from another person eating meals?: None Help from another person taking care of personal grooming?: A Little Help from another person toileting, which includes using toliet, bedpan, or urinal?: A Lot Help from another person bathing (including washing, rinsing, drying)?: A Lot Help from another person to put on and taking off regular upper body clothing?: A Little Help from another person to put on and taking off regular lower body clothing?: Total 6 Click Score: 15    End of Session Equipment Utilized During Treatment: Gait belt;Rolling walker  OT Visit Diagnosis: Unsteadiness on feet (R26.81);Pain;Muscle weakness (generalized) (M62.81)   Activity Tolerance Patient limited by fatigue   Patient Left in bed;with call bell/phone within reach;with family/visitor present;with nursing/sitter in room    Nurse Communication Mobility status        Time: 5465-0354 OT Time Calculation (min): 12 min  Charges: OT General Charges $OT Visit: 1 Visit OT Treatments $Therapeutic Activity: 8-22 mins  07/08/2017 [name], OTR/L Pager: [phone]  [name] [name] 07/08/2017, 11:01 AM

## 2017-07-08 NOTE — Progress Notes (Signed)
HD tx completed @ 0145 w/ low bp issues throughout tx requiring pt to be in trendelenberg, will leave a note for MD to assess if pt can have midodrine for future txs, UF goal still met, blood rinsed back, report called to Gomez Cleverly, RN

## 2017-07-08 NOTE — Progress Notes (Signed)
[facility] Progress Note   Assessment/ Plan:    1. Acute on chronic systolic CHF: pt vol overloaded with progressive CKD--> to start dialysis.  Still vol overloaded but gradually improving with HD.  Advanced HF following.  She has had a history of enterococcal endocarditis x 2. On Imdur (Coreg stopped).  2.  Afib: on warfarin and amiodarone.  To have TEE-DCCV on Friday.  3. 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  VVS has been by to eval fistula- duplex shows adequate size of fistula.  Being cannulated successfully.  CLIP in process.  Since pt is being cardioverted Friday, will plan to dialyze Thursday too for optimization of vol status.  4. Anemia: Hgb 12.4, no ESA  5. CKD-MBD: checking PTH, on calcitriol 0.25 mcg TIW.  Phos 5.2, no binders yet  6. Nutrition: Albumin 3.6  7. Hypertension: as above in #1  8: Dispo: pending, for SNF  Subjective:    HD #3 today.  Is still a bit tired without a lot of energy.    Objective:   BP (!) 96/58 (BP Location: Left Arm)   Pulse 96   Temp 97.7 F (36.5 C) (Oral)   Resp 16   Ht 5\' 5"  (1.651 m)   Wt 87.5 kg (192 lb 14.4 oz)   SpO2 97%   BMI 32.10 kg/m   Physical Exam: Gen: older woman, lying in bed, appears tired  HEENT: + JVD CVS: irregular, soft systolic murmur Resp: muffled bilaterally esp at the bases, some crackles Abd: soft nontender NABS Ext:1+ LE edema ACCESS: RUE AVF + T/B, tortuous, dressed.    Labs: BMET Recent Labs  Lab 07/03/17 0501 07/04/17 0349 07/05/17 0844 07/06/17 0624 07/07/17 0623 07/07/17 2214 07/08/17 0653  NA 134* 129* 128* 130* 132* 134* 137  K 4.5 4.7 5.4* 5.0 4.3 4.5 3.8  CL 99* 99* 97* 98* 98* 98* 102  CO2 19* 19* 15* 18* 22 23 27   GLUCOSE 241* 208* 292* 156* 131* 216* 86  BUN 115* 128* 148* 158* 96* 103* 51*  CREATININE 4.14* 3.93* 3.53* 3.38* 2.38* 2.50* 1.67*  CALCIUM 9.3 9.4 9.5 9.9 9.4 9.9 8.8*  PHOS  --   --   --   --   --  5.2*  --    CBC Recent Labs  Lab  07/02/17 1131  07/05/17 0844 07/06/17 1430 07/07/17 2213 07/08/17 0653  WBC 7.4   < > 8.8 11.6* 12.5* 11.1*  NEUTROABS 5.8  --   --   --   --  9.0*  HGB 11.1*   < > 11.4* 11.3* 12.5 12.4  HCT 35.5*   < > 35.6* 34.7* 38.4 38.6  MCV 98.6   < > 97.8 95.9 97.0 98.5  PLT 242   < > 203 217 216 193   < > = values in this interval not displayed.    @IMGRELPRIORS @ Medications:    . amiodarone  200 mg Oral Daily  . calcitRIOL  0.25 mcg Oral Daily  . feeding supplement (ENSURE ENLIVE)  237 mL Oral TID BM  . ferrous sulfate  325 mg Oral BID  . insulin aspart  0-20 Units Subcutaneous TID WC  . insulin aspart  0-5 Units Subcutaneous QHS  . insulin aspart  9 Units Subcutaneous TID WC  . insulin glargine  14 Units Subcutaneous Daily  . isosorbide mononitrate  30 mg Oral Daily  . levothyroxine  50 mcg Oral QAC breakfast  . multivitamin with minerals  1 tablet  Oral Daily  . polyethylene glycol  17 g Oral Daily  . pravastatin  80 mg Oral QHS  . sodium chloride flush  3 mL Intravenous Q12H  . sodium chloride flush  3 mL Intravenous Q12H  . Warfarin - Pharmacist Dosing Inpatient   Does not apply [place], MD [place] pgr [phone] 07/08/2017, 1:18 PM

## 2017-07-08 NOTE — Progress Notes (Signed)
Patient had 5 beats of vtach, back from dialysis and feeling tired. BP running soft, but not abnormal for her. O2 sats WNL. Family at bedside.

## 2017-07-08 NOTE — Clinical Social Work Note (Signed)
CSW acknowledges SNF consult. Patient was dc'ed from PT services on 1/14 due to medical decline and noted they would need to be reconsulted before therapy could resume. Please enter PT/OT orders for re-evaluation for SNF.  Dayton Scrape, [place]

## 2017-07-08 NOTE — Progress Notes (Signed)
Patient ID: [name], female   DOB: 19-Apr-1946, 72 y.o.   MRN: [id number]     Advanced Heart Failure Rounding Note  Primary Cardiologist: Aundra Dubin  Subjective:    Tolerated HD on 1/15.   Complaining of fatigue. Denies SOB    Objective:   Weight Range: 192 lb 14.4 oz (87.5 kg) Body mass index is 32.1 kg/m.   Vital Signs:   Temp:  [97.4 F (36.3 C)-98 F (36.7 C)] 97.7 F (36.5 C) (01/16 0426) Pulse Rate:  [54-123] 54 (01/16 0426) Resp:  [11-18] 16 (01/16 0426) BP: (78-119)/(51-90) 83/65 (01/16 0426) SpO2:  [96 %-99 %] 96 % (01/16 0426) Weight:  [192 lb 14.4 oz (87.5 kg)-194 lb 3.6 oz (88.1 kg)] 192 lb 14.4 oz (87.5 kg) (01/16 0212) Last BM Date: 07/04/17(per pt report)  Weight change: Filed Weights   07/07/17 0328 07/07/17 2235 07/08/17 0212  Weight: 198 lb 13.7 oz (90.2 kg) 194 lb 3.6 oz (88.1 kg) 192 lb 14.4 oz (87.5 kg)    Intake/Output:   Intake/Output Summary (Last 24 hours) at 07/08/2017 0739 Last data filed at 07/08/2017 0145 Gross per 24 hour  Intake 680 ml  Output 1473 ml  Net -793 ml      Physical Exam   General:  Appears chronically ill.  No resp difficulty. In bed.  HEENT: normal Neck: supple. JVP ~10. Carotids 2+ bilat; no bruits. No lymphadenopathy or thryomegaly appreciated. Cor: PMI nondisplaced. Irregular rate & rhythm. No rubs, gallops or murmurs. Lungs: Crackles RLL LLL Abdomen: soft, nontender, nondistended. No hepatosplenomegaly. No bruits or masses. Good bowel sounds. Extremities: no cyanosis, clubbing, rash, edema. RUE AVF  Neuro: alert & orientedx3, cranial nerves grossly intact. moves all 4 extremities w/o difficulty. Affect pleasant   Telemetry   A fib 90s personally reviewed.   EKG    Atrial fibrillation with wide LBBB  Labs    CBC Recent Labs    07/06/17 1430 07/07/17 2213  WBC 11.6* 12.5*  HGB 11.3* 12.5  HCT 34.7* 38.4  MCV 95.9 97.0  PLT 217 814   Basic Metabolic Panel Recent Labs    07/07/17 0623  07/07/17 2214  NA 132* 134*  K 4.3 4.5  CL 98* 98*  CO2 22 23  GLUCOSE 131* 216*  BUN 96* 103*  CREATININE 2.38* 2.50*  CALCIUM 9.4 9.9  PHOS  --  5.2*   Liver Function Tests Recent Labs    07/06/17 1430 07/07/17 2214  ALT 61*  --   ALBUMIN  --  3.1*   No results for input(s): LIPASE, AMYLASE in the last 72 hours. Cardiac Enzymes No results for input(s): CKTOTAL, CKMB, CKMBINDEX, TROPONINI in the last 72 hours.  BNP: BNP (last 3 results) Recent Labs    06/26/17 0806 07/02/17 1131 07/03/17 0501  BNP 2,226.0* 1,605.1* 2,179.2*    ProBNP (last 3 results) No results for input(s): PROBNP in the last 8760 hours.   D-Dimer No results for input(s): DDIMER in the last 72 hours. Hemoglobin A1C No results for input(s): HGBA1C in the last 72 hours. Fasting Lipid Panel No results for input(s): CHOL, HDL, LDLCALC, TRIG, CHOLHDL, LDLDIRECT in the last 72 hours. Thyroid Function Tests No results for input(s): TSH, T4TOTAL, T3FREE, THYROIDAB in the last 72 hours.  Invalid input(s): FREET3  Other results:   Imaging    No results found.   Medications:     Scheduled Medications: . amiodarone  200 mg Oral Daily  . calcitRIOL  0.25 mcg Oral  Daily  . feeding supplement (ENSURE ENLIVE)  237 mL Oral TID BM  . ferrous sulfate  325 mg Oral BID  . insulin aspart  0-20 Units Subcutaneous TID WC  . insulin aspart  0-5 Units Subcutaneous QHS  . insulin aspart  9 Units Subcutaneous TID WC  . insulin glargine  14 Units Subcutaneous Daily  . isosorbide mononitrate  30 mg Oral Daily  . levothyroxine  50 mcg Oral QAC breakfast  . multivitamin with minerals  1 tablet Oral Daily  . polyethylene glycol  17 g Oral Daily  . pravastatin  80 mg Oral QHS  . sodium chloride flush  3 mL Intravenous Q12H  . Warfarin - Pharmacist Dosing Inpatient   Does not apply q1800    Infusions: . sodium chloride      PRN Medications: sodium chloride, acetaminophen **OR** acetaminophen,  albuterol, guaiFENesin-dextromethorphan, ondansetron **OR** ondansetron (ZOFRAN) IV, oxyCODONE-acetaminophen **AND** oxyCODONE, senna-docusate, sodium chloride flush, zolpidem    Patient Profile   72 yo with history of chronic systolic CHF from nonischemic cardiomyopathy, prior PE, CKD IV-V, and paroxysmal atrial fibrillation presented with dyspnea and progressive renal dysfunction.   Assessment/Plan   1. Acute on chronic systolic CHF: Echo (8/25) with EF 20-25%, nonischemic cardiomyopathy.  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  On exam today, she is volume overloaded though she is comfortable at rest.  Suspect this is potentiated by progressive renal failure. Suspect the majority of her dyspnea is due to CHF, COPD probably plays a lesser role.  Tolerated HD 1/15.  Coreg stopped 1/14 2. 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  Had HD 1/14 1/15 .   Nephrology following.   3. Atrial fibrillation: Persistent.  Was in NSR in 9/18 but has been in atrial fibrillation since admitted this time.   She has been on amiodarone at home.  - Continue amiodarone to 200 mg daily.  - Continue warfarin, INR pending.  - Will need TEE /DC-CV once volume status better. Will arrange for Friday.   4. OSA: Uses CPAP at home, provide here.  5. COPD: On Solumedrol per primary team.  Suspect CHF is main issue.  6. Deconditioning: PT. OT recommending --> SNF  Plan for TEE/DC-CV once volume status improvied.  Length of Stay: 6  Amy Clegg, NP  07/08/2017, 7:39 AM  Advanced Heart Failure Team Pager [phone] (M-F; 7a - 4p)  Please contact [facility] for night-coverage after hours (4p -7a ) and weekends on [email or web address]  Patient seen with NP, agree with the above note.  She appears weak today.  Did ok with HD last night.  Remains in atrial fibrillation. Still with JVD on exam.   Ongoing HD per renal.   I will arrange for TEE-guided DCCV on Friday once  volume is more optimized.  Risks/benefits of procedure explained to patient/family and they agree to procedure.   Loralie Champagne 07/08/2017 9:44 AM

## 2017-07-08 NOTE — Progress Notes (Signed)
According to report from previous RN, patient has refused medications to assist with her having a bowel movement. According to documentation she hasn't had a BM since 1/10

## 2017-07-08 NOTE — Progress Notes (Signed)
ANTICOAGULATION CONSULT NOTE - Follow Up Consult  Pharmacy Consult for warfarin Indication: atrial fibrillation and VTE prophylaxis  No Known Allergies  Patient Measurements: Height: 5\' 5"  (165.1 cm) Weight: 192 lb 14.4 oz (87.5 kg) IBW/kg (Calculated) : 57   Vital Signs: Temp: 97.7 F (36.5 C) (01/16 0426) Temp Source: Oral (01/16 0426) BP: 96/58 (01/16 1129) Pulse Rate: 96 (01/16 1129)  Labs: Recent Labs    07/06/17 0624  07/06/17 1430 07/07/17 0623 07/07/17 2213 07/07/17 2214 07/08/17 0653  HGB  --    < > 11.3*  --  12.5  --  12.4  HCT  --   --  34.7*  --  38.4  --  38.6  PLT  --   --  217  --  216  --  193  LABPROT 22.0*  --   --  21.2*  --   --  21.3*  INR 1.94  --   --  1.86  --   --  1.86  CREATININE 3.38*  --   --  2.38*  --  2.50* 1.67*   < > = values in this interval not displayed.    Estimated Creatinine Clearance: 32.8 mL/min (A) (by C-G formula based on SCr of 1.67 mg/dL (H)).  Medications:  Scheduled:  . amiodarone  200 mg Oral Daily  . calcitRIOL  0.25 mcg Oral Daily  . feeding supplement (ENSURE ENLIVE)  237 mL Oral TID BM  . ferrous sulfate  325 mg Oral BID  . insulin aspart  0-20 Units Subcutaneous TID WC  . insulin aspart  0-5 Units Subcutaneous QHS  . insulin aspart  9 Units Subcutaneous TID WC  . insulin glargine  14 Units Subcutaneous Daily  . isosorbide mononitrate  30 mg Oral Daily  . levothyroxine  50 mcg Oral QAC breakfast  . multivitamin with minerals  1 tablet Oral Daily  . polyethylene glycol  17 g Oral Daily  . pravastatin  80 mg Oral QHS  . sodium chloride flush  3 mL Intravenous Q12H  . sodium chloride flush  3 mL Intravenous Q12H  . warfarin  4 mg Oral ONCE-1800  . Warfarin - Pharmacist Dosing Inpatient   Does not apply q1800    Assessment: 72 y.o female presented with SOB and recent admit on 1/4 for CHF exacerbation.  On warfarin for afib prior to admission with therapeutic INR 3 upon admission.   Home regimen: 1.25 mg  on MWF and 2.5 mg all other days (weekly dose 13.75mg ).  INR trended down from 3.1 to 2.02 after holding dose 1/11.  INR trended down further 1.86 and stayed overnight despite several doses above her home dose.  Concern to jump too high but also planned TEE/DCCV on Friday so would like INR to be >2 Amiodarone dose slightly higher than home dose.  No overt bleeding or complications noted.  Awaiting SNF placement  Goal of Therapy:  INR 2-3 Monitor platelets by anticoagulation protocol: Yes   Plan:  Repeat Warfarin 4 mg x1 tonight. Monitor daily INR, CBC, signs/symptoms of bleeding   Bonnita Nasuti Pharm.D. CPP, BCPS Clinical Pharmacist [phone] 07/08/2017 1:43 PM

## 2017-07-08 NOTE — Progress Notes (Signed)
PROGRESS NOTE    [name]  JHE:[id number] DOB: Jul 25, 1945 DOA: 07/02/2017 PCP: Lemmie Evens, MD    Brief Narrative:  72 yo female presents with dyspnea and generalized weakness. Patient is known to have a significant medical history of nonischemic cardiomyopathy EF 20%, atrial fibrillation, type 2 diabetes mellitus, stage IV chronic kidney disease. Complaint of worsening dyspnea for last 4 days, associated with wheezing and decreased functional physical capacity. On initial physical examination blood pressure 101/78, heart rate 73, oxygen saturation 95% on supplemental oxygen, moist mucous membranes, lungs clear to auscultation bilaterally, heart S1-S2 present rhythmic irregularly irregular, the abdomen was soft nontender, no lower extremity edema. Sodium 135, potassium 4.4, chloride 99, bicarbonate 17, glucose 29, BUN 109, creatinine 4.11, white count 7.4, hemoglobin 11.1, hematocrit 35.5, platelets 242. BNP 1605, EKG left bundle branch block, atrial fibrillation 95 beats per minute, left axis deviation.    Patient was admitted with working diagnosis of COPD exacerbation, complicated with acute on chronic systolic heart failure.   Assessment & Plan:   Principal Problem:   COPD (chronic obstructive pulmonary disease) (HCC) Active Problems:   Dyslipidemia   HTN (hypertension)   History of pulmonary embolism   Type 2 diabetes, uncontrolled, with renal manifestation (HCC)   Chronic renal disease, stage 4, severely decreased glomerular filtration rate (GFR) between 15-29 mL/min/1.73 square meter (HCC)   Chronic anticoagulation   Atrial fibrillation (HCC)   Implantable cardioverter-defibrillator-CRT- Mdt   Anemia in chronic renal disease   Hypothyroidism   SOB (shortness of breath)   Acute on chronic systolic CHF (congestive heart failure), NYHA class 4 (HCC)   Palliative care by specialist   Goals of care, counseling/discussion   Generalized weakness   Nonischemic cardiomyopathy  ([place])   Hyperglycemia   Dependence on renal dialysis ([place])   Leukocytosis   Acute blood loss anemia   1. Systolic heart failure decompensation. Improve volume status with ultrafiltration, will continue heart failure regimen with after load reducing agents, isosorbide mononitrate.   2. COPD exacerbation. Will continue duoneb, oxymetry monitoring, keep negative fluid balance. Today with no wheezing.  3. Acute on chronic kidney disease stage IV started HD on this admission. Patient on HD plus ultrafiltration with improved volume, serum K at 3,8 with serum bicarbonate at 27. Will continue to follow on renal function. Continue calcitriol. On midodrine on HD to prevent hypotension.   4. Paroxysmal atrial fibrillation. Patient on atrial fibrillation, plan for electrical cardioversion, placed on po amiodarone, and continue anticoagulation with warfarin.   5. Type 2 diabetes mellitus. Will continue insulin sliding scale for glucose cover and monitoring, capillary glucose 161, 160, 75, 150, 150. Will decrease basal dose of insulin to prevent hypoglycemia.   6. Hypothyroidism. Continue levothyroxine.   DVT prophylaxis: warfarin  Code Status: full Family Communication: I spoke with patient's daughter at the bedside and all questions were addressed.  Disposition Plan: home or snf   Consultants:   Cardiology  nephrology  Procedures:     Antimicrobials:       Subjective: Dyspnea has been improved, positive cough. Positive burning sensation at the chest, with no radiation, no associated nausea or vomiting, no radiation.   Objective: Vitals:   07/08/17 0145 07/08/17 0212 07/08/17 0426 07/08/17 1129  BP: 112/67 96/62 (!) 83/65 (!) 96/58  Pulse: 74 77 (!) 54 96  Resp: 16 13 16    Temp:  97.6 F (36.4 C) 97.7 F (36.5 C)   TempSrc:  Oral Oral   SpO2: 99% 97%  96% 97%  Weight:  87.5 kg (192 lb 14.4 oz)    Height:        Intake/Output Summary (Last 24 hours) at 07/08/2017 1133 Last  data filed at 07/08/2017 0903 Gross per 24 hour  Intake 680 ml  Output 1473 ml  Net -793 ml   Filed Weights   07/07/17 0328 07/07/17 2235 07/08/17 9604  Weight: 90.2 kg (198 lb 13.7 oz) 88.1 kg (194 lb 3.6 oz) 87.5 kg (192 lb 14.4 oz)    Examination:   General: Not in pain or dyspnea, deconditioned Neurology: Awake and alert, non focal  E ENT: mild pallor, no icterus, oral mucosa moist Cardiovascular: No JVD. S1-S2 present, irregular irregular, no gallops, rubs, or murmurs. Trace non pitting lower extremity edema. Pulmonary: decreased breath sounds bilaterally bases, adequate air movement, no wheezing, rhonchi. scattered rales. Gastrointestinal. Abdomen flat, no organomegaly, non tender, no rebound or guarding Skin. No rashes Musculoskeletal: no joint deformities     Data Reviewed: I have personally reviewed following labs and imaging studies  CBC: Recent Labs  Lab 07/02/17 1131 07/03/17 0501 07/05/17 0844 07/06/17 1430 07/07/17 2213 07/08/17 0653  WBC 7.4 4.3 8.8 11.6* 12.5* 11.1*  NEUTROABS 5.8  --   --   --   --  9.0*  HGB 11.1* 10.6* 11.4* 11.3* 12.5 12.4  HCT 35.5* 33.9* 35.6* 34.7* 38.4 38.6  MCV 98.6 97.4 97.8 95.9 97.0 98.5  PLT 242 207 203 217 216 540   Basic Metabolic Panel: Recent Labs  Lab 07/03/17 0501  07/05/17 0844 07/06/17 0624 07/07/17 0623 07/07/17 2214 07/08/17 0653  NA 134*   < > 128* 130* 132* 134* 137  K 4.5   < > 5.4* 5.0 4.3 4.5 3.8  CL 99*   < > 97* 98* 98* 98* 102  CO2 19*   < > 15* 18* 22 23 27   GLUCOSE 241*   < > 292* 156* 131* 216* 86  BUN 115*   < > 148* 158* 96* 103* 51*  CREATININE 4.14*   < > 3.53* 3.38* 2.38* 2.50* 1.67*  CALCIUM 9.3   < > 9.5 9.9 9.4 9.9 8.8*  MG 3.3*  --   --   --   --   --   --   PHOS  --   --   --   --   --  5.2*  --    < > = values in this interval not displayed.   GFR: Estimated Creatinine Clearance: 32.8 mL/min (A) (by C-G formula based on SCr of 1.67 mg/dL (H)). Liver Function Tests: Recent  Labs  Lab 07/02/17 1131 07/06/17 1430 07/07/17 2214  AST 36  --   --   ALT 160* 61*  --   ALKPHOS 52  --   --   BILITOT 1.1  --   --   PROT 6.8  --   --   ALBUMIN 3.6  --  3.1*   No results for input(s): LIPASE, AMYLASE in the last 168 hours. No results for input(s): AMMONIA in the last 168 hours. Coagulation Profile: Recent Labs  Lab 07/04/17 0349 07/05/17 0844 07/06/17 0624 07/07/17 0623 07/08/17 0653  INR 3.10 2.02 1.94 1.86 1.86   Cardiac Enzymes: No results for input(s): CKTOTAL, CKMB, CKMBINDEX, TROPONINI in the last 168 hours. BNP (last 3 results) No results for input(s): PROBNP in the last 8760 hours. HbA1C: No results for input(s): HGBA1C in the last 72 hours. CBG: Recent Labs  Lab  07/07/17 1839 07/07/17 2108 07/08/17 0731 07/08/17 1041 07/08/17 1128  GLUCAP 161* 160* 75 150* 150*   Lipid Profile: No results for input(s): CHOL, HDL, LDLCALC, TRIG, CHOLHDL, LDLDIRECT in the last 72 hours. Thyroid Function Tests: No results for input(s): TSH, T4TOTAL, FREET4, T3FREE, THYROIDAB in the last 72 hours. Anemia Panel: No results for input(s): VITAMINB12, FOLATE, FERRITIN, TIBC, IRON, RETICCTPCT in the last 72 hours.    Radiology Studies: I have reviewed all of the imaging during this hospital visit personally     Scheduled Meds: . amiodarone  200 mg Oral Daily  . calcitRIOL  0.25 mcg Oral Daily  . feeding supplement (ENSURE ENLIVE)  237 mL Oral TID BM  . ferrous sulfate  325 mg Oral BID  . insulin aspart  0-20 Units Subcutaneous TID WC  . insulin aspart  0-5 Units Subcutaneous QHS  . insulin aspart  9 Units Subcutaneous TID WC  . insulin glargine  14 Units Subcutaneous Daily  . isosorbide mononitrate  30 mg Oral Daily  . levothyroxine  50 mcg Oral QAC breakfast  . multivitamin with minerals  1 tablet Oral Daily  . polyethylene glycol  17 g Oral Daily  . pravastatin  80 mg Oral QHS  . sodium chloride flush  3 mL Intravenous Q12H  . sodium chloride  flush  3 mL Intravenous Q12H  . Warfarin - Pharmacist Dosing Inpatient   Does not apply q1800   Continuous Infusions: . sodium chloride    . sodium chloride       LOS: 6 days        Tawni Millers, MD [facility] Pager [phone]

## 2017-07-09 ENCOUNTER — Encounter (HOSPITAL_COMMUNITY): Payer: Self-pay | Admitting: General Practice

## 2017-07-09 LAB — CBC
HCT: 38 % (ref 36.0–46.0)
HEMOGLOBIN: 11.8 g/dL — AB (ref 12.0–15.0)
MCH: 31.1 pg (ref 26.0–34.0)
MCHC: 31.1 g/dL (ref 30.0–36.0)
MCV: 100 fL (ref 78.0–100.0)
Platelets: 191 10*3/uL (ref 150–400)
RBC: 3.8 MIL/uL — AB (ref 3.87–5.11)
RDW: 17 % — ABNORMAL HIGH (ref 11.5–15.5)
WBC: 10.7 10*3/uL — AB (ref 4.0–10.5)

## 2017-07-09 LAB — BASIC METABOLIC PANEL
Anion gap: 12 (ref 5–15)
BUN: 33 mg/dL — AB (ref 6–20)
CHLORIDE: 97 mmol/L — AB (ref 101–111)
CO2: 26 mmol/L (ref 22–32)
CREATININE: 1.82 mg/dL — AB (ref 0.44–1.00)
Calcium: 8.4 mg/dL — ABNORMAL LOW (ref 8.9–10.3)
GFR calc non Af Amer: 26 mL/min — ABNORMAL LOW (ref >60)
GFR, EST AFRICAN AMERICAN: 31 mL/min — AB (ref >60)
GLUCOSE: 208 mg/dL — AB (ref 65–99)
Potassium: 4.7 mmol/L (ref 3.5–5.1)
Sodium: 135 mmol/L (ref 135–145)

## 2017-07-09 LAB — PROTIME-INR
INR: 2.06
Prothrombin Time: 23 seconds — ABNORMAL HIGH (ref 11.4–15.2)

## 2017-07-09 LAB — HEPATITIS B SURFACE ANTIBODY,QUALITATIVE: Hep B S Ab: NONREACTIVE

## 2017-07-09 LAB — GLUCOSE, CAPILLARY
GLUCOSE-CAPILLARY: 149 mg/dL — AB (ref 65–99)
Glucose-Capillary: 182 mg/dL — ABNORMAL HIGH (ref 65–99)

## 2017-07-09 MED ORDER — WARFARIN SODIUM 2.5 MG PO TABS
2.5000 mg | ORAL_TABLET | Freq: Once | ORAL | Status: AC
  Administered 2017-07-09: 2.5 mg via ORAL
  Filled 2017-07-09 (×2): qty 1

## 2017-07-09 MED ORDER — PRO-STAT SUGAR FREE PO LIQD
30.0000 mL | Freq: Three times a day (TID) | ORAL | Status: DC
  Administered 2017-07-09 – 2017-07-15 (×8): 30 mL via ORAL
  Filled 2017-07-09 (×13): qty 30

## 2017-07-09 MED ORDER — NEPRO/CARBSTEADY PO LIQD
237.0000 mL | Freq: Two times a day (BID) | ORAL | Status: DC
  Administered 2017-07-09 – 2017-07-12 (×5): 237 mL via ORAL
  Filled 2017-07-09 (×17): qty 237

## 2017-07-09 MED ORDER — INSULIN GLARGINE 100 UNIT/ML ~~LOC~~ SOLN
5.0000 [IU] | Freq: Every day | SUBCUTANEOUS | Status: DC
  Administered 2017-07-10 – 2017-07-15 (×6): 5 [IU] via SUBCUTANEOUS
  Filled 2017-07-09 (×6): qty 0.05

## 2017-07-09 MED ORDER — SODIUM CHLORIDE 0.9 % IV SOLN
INTRAVENOUS | Status: DC
  Administered 2017-07-09: 23:00:00 via INTRAVENOUS

## 2017-07-09 NOTE — Progress Notes (Signed)
Patients BP remains soft, patient asymptomatic and says she feels better than she did. Asking for pain medication, but RN asked if patient could hold off until BP is rechecked. Patient agrees.

## 2017-07-09 NOTE — Clinical Social Work Placement (Signed)
   CLINICAL SOCIAL WORK PLACEMENT  NOTE  Date:  07/09/2017  Patient Details  Name: [name] MRN: [id number] Date of Birth: 01-17-1946  Clinical Social Work is seeking post-discharge placement for this patient at the [place] level of care (*CSW will initial, date and re-position this form in  chart as items are completed):  Yes   Patient/family provided with [place] Work Department's list of facilities offering this level of care within the geographic area requested by the patient (or if unable, by the patient's family).  Yes   Patient/family informed of their freedom to choose among providers that offer the needed level of care, that participate in Medicare, Medicaid or managed care program needed by the patient, have an available bed and are willing to accept the patient.  Yes   Patient/family informed of [place]'s ownership interest in [facility] and [facility], as well as of the fact that they are under no obligation to receive care at these facilities.  PASRR submitted to EDS on 07/09/17     PASRR number received on       Existing PASRR number confirmed on 07/09/17     FL2 transmitted to all facilities in geographic area requested by pt/family on 07/09/17     FL2 transmitted to all facilities within larger geographic area on       Patient informed that his/her managed care company has contracts with or will negotiate with certain facilities, including the following:            Patient/family informed of bed offers received.  Patient chooses bed at       Physician recommends and patient chooses bed at      Patient to be transferred to   on  .  Patient to be transferred to facility by       Patient family notified on   of transfer.  Name of family member notified:        PHYSICIAN Please sign FL2     Additional Comment:    _______________________________________________ Candie Chroman, LCSW 07/09/2017, 4:38  PM

## 2017-07-09 NOTE — Progress Notes (Signed)
[facility] Progress Note   Assessment/ Plan:    1. Acute on chronic systolic CHF: pt vol overloaded with progressive CKD--> to start dialysis.  Still vol overloaded but gradually improving with HD.  Advanced HF following.  She has had a history of enterococcal endocarditis x 2. On Imdur (Coreg stopped).  2.  Afib: on warfarin and amiodarone.  To have TEE-DCCV on Friday.  3. 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  [facility] has been by to eval fistula- duplex shows adequate size of fistula.  Being cannulated successfully.  CLIP in process.  Since pt is being cardioverted Friday, HD today and then planned for Sat.  Midodrine before HD is helping Bps.  4. Anemia: Hgb 12.4, no ESA  5. CKD-MBD: checking PTH, on calcitriol 0.25 mcg TIW.  Phos 5.2, no binders yet  6. Nutrition: Albumin 3.6  7. Hypertension: as above in #1  8: Dispo: pending, for SNF  Subjective:    Full HD treatment today, nothing planned for tomorrow due to TEE-DCCV.   Objective:   BP 117/65 (BP Location: Left Wrist)   Pulse 99   Temp 97.9 F (36.6 C) (Oral)   Resp 18   Ht 5\' 5"  (1.651 m)   Wt 85 kg (187 lb 6.3 oz)   SpO2 95%   BMI 31.18 kg/m   Physical Exam: Gen: older woman, lying in bed, appears tired.  Covered up in blankets HEENT: + JVD, improving CVS: irregular, soft systolic murmur Resp: muffled bilaterally esp at the bases, some crackles Abd: soft nontender NABS Ext:1+ LE edema ACCESS: RUE AVF + T/B, tortuous, accessed. Labs: BMET Recent Labs  Lab 07/04/17 0349 07/05/17 0844 07/06/17 0624 07/07/17 0623 07/07/17 2214 07/08/17 0653 07/09/17 0635  NA 129* 128* 130* 132* 134* 137 135  K 4.7 5.4* 5.0 4.3 4.5 3.8 4.7  CL 99* 97* 98* 98* 98* 102 97*  CO2 19* 15* 18* 22 23 27 26   GLUCOSE 208* 292* 156* 131* 216* 86 208*  BUN 128* 148* 158* 96* 103* 51* 33*  CREATININE 3.93* 3.53* 3.38* 2.38* 2.50* 1.67* 1.82*  CALCIUM 9.4 9.5 9.9 9.4 9.9 8.8* 8.4*  PHOS  --   --   --   --  5.2*  --    --    CBC Recent Labs  Lab 07/06/17 1430 07/07/17 2213 07/08/17 0653 07/09/17 0741  WBC 11.6* 12.5* 11.1* 10.7*  NEUTROABS  --   --  9.0*  --   HGB 11.3* 12.5 12.4 11.8*  HCT 34.7* 38.4 38.6 38.0  MCV 95.9 97.0 98.5 100.0  PLT 217 216 193 191    @IMGRELPRIORS @ Medications:    . amiodarone  200 mg Oral Daily  . calcitRIOL  0.25 mcg Oral Daily  . feeding supplement (NEPRO CARB STEADY)  237 mL Oral BID BM  . feeding supplement (PRO-STAT SUGAR FREE 64)  30 mL Oral TID BM  . ferrous sulfate  325 mg Oral BID  . insulin aspart  0-20 Units Subcutaneous TID WC  . insulin aspart  0-5 Units Subcutaneous QHS  . insulin aspart  3 Units Subcutaneous TID WC  . insulin glargine  7 Units Subcutaneous Daily  . isosorbide mononitrate  30 mg Oral Daily  . levothyroxine  50 mcg Oral QAC breakfast  . multivitamin with minerals  1 tablet Oral Daily  . polyethylene glycol  17 g Oral Daily  . pravastatin  80 mg Oral QHS  . sodium chloride flush  3 mL Intravenous  Q12H  . sodium chloride flush  3 mL Intravenous Q12H  . warfarin  2.5 mg Oral ONCE-1800  . Warfarin - Pharmacist Dosing Inpatient   Does not apply [place], MD [place] pgr [phone] 07/09/2017, 1:31 PM

## 2017-07-09 NOTE — Progress Notes (Signed)
Nutrition Follow-up  DOCUMENTATION CODES:   Obesity unspecified  INTERVENTION:   -D/c Ensure Enlive po TID, each supplement provides 350 kcal and 20 grams of protein, due to poor acceptance -Nepro Shake po BID, each supplement provides 425 kcal and 19 grams protein -30 ml Prostat TID, each supplement provides 100 kcals and 15 grams protein  NUTRITION DIAGNOSIS:   Inadequate oral intake related to poor appetite as evidenced by meal completion < 25%.  Ongoing  GOAL:   Patient will meet greater than or equal to 90% of their needs  Unmet  MONITOR:   PO intake, Supplement acceptance, Labs, Weight trends, Skin, I & O's  REASON FOR ASSESSMENT:   Consult Assessment of nutrition requirement/status(CHF)  ASSESSMENT:   [name] is a 72 y.o. female with medical history significant of nonischemic cardiomyopathy with EF of 20% as per ECHO 1/5 2019, A. fib on Coumadin, type 2 diabetes mellitus with chronic kidney disease stage IV, anemia of chronic kidney disease, obstructive sleep apnea on CPAP, dyslipidemia and obesity presented to the ED with increasing shortness of breath since discharge from the hospital on June 30, 2017  1/13- s/p AVF placement 1/14- HD initiated  Per nursing notes, pt with hypoglycemic episode this AM.   Pt out of room at time of visit. Pt continues to be lethargic with poor oral intake (PO: 10-50%). Pt has been refusing Ensure supplements.   Per MD notes, palliative care has seen pt and family, who still desires aggressive measures.   Labs reviewed: CBGS: 42-124 (inpatient orders for glycemic control are 0-20 units insulin aspart TID with meals, 0-5 units insulin aspart q HS, 3 units insulin aspart TID with meals).   Diet Order:  Diet heart healthy/carb modified Room service appropriate? Yes; Fluid consistency: Thin  EDUCATION NEEDS:   Education needs have been addressed  Skin:  Skin Assessment: Skin Integrity Issues: Skin Integrity Issues::  Other (Comment) Other: MASD buttocks  Last BM:  07/08/17  Height:   Ht Readings from Last 1 Encounters:  07/02/17 5\' 5"  (1.651 m)    Weight:   Wt Readings from Last 1 Encounters:  07/09/17 189 lb 9.5 oz (86 kg)    Ideal Body Weight:  56.8 kg  BMI:  Body mass index is 31.55 kg/m.  Estimated Nutritional Needs:   Kcal:  1500-1700  Protein:  70-85 grams  Fluid:  1.5-1.7 L    Nasif Bos A. Jimmye Cabiness, RD, LDN, CDE Pager: [phone] After hours Pager: [phone]

## 2017-07-09 NOTE — Progress Notes (Signed)
Patient ID: DREAMER CARILLO, female   DOB: [birth year], 72 y.o.   MRN: [id number]     Advanced Heart Failure Rounding Note  Primary Cardiologist: Aundra Dubin  Subjective:    Tolerating HD currently.   Continues to complain of fatigue. Denies SOB or pain.   BP soft in 80-100s. Weight stable.  Objective:   Weight Range: 189 lb 9.5 oz (86 kg) Body mass index is 31.55 kg/m.   Vital Signs:   Temp:  [97.5 F (36.4 C)-97.9 F (36.6 C)] 97.9 F (36.6 C) (01/17 0830) Pulse Rate:  [42-119] 105 (01/17 1030) Resp:  [13-20] 17 (01/17 1030) BP: (82-124)/(45-82) 103/78 (01/17 1030) SpO2:  [92 %-100 %] 95 % (01/17 0830) Weight:  [188 lb 11.4 oz (85.6 kg)-192 lb 7.4 oz (87.3 kg)] 189 lb 9.5 oz (86 kg) (01/17 0830) Last BM Date: 07/08/17  Weight change: Filed Weights   07/08/17 1851 07/09/17 0616 07/09/17 0830  Weight: 189 lb 13.1 oz (86.1 kg) 188 lb 11.4 oz (85.6 kg) 189 lb 9.5 oz (86 kg)    Intake/Output:   Intake/Output Summary (Last 24 hours) at 07/09/2017 1052 Last data filed at 07/09/2017 0500 Gross per 24 hour  Intake 720 ml  Output 1000 ml  Net -280 ml      Physical Exam   Undergoing HD currently  General: Chronically ill and elderly appearing.  HEENT: Normal Neck: Supple. JVP ~9-10 cm. Carotids 2+ bilat; no bruits. No thyromegaly or nodule noted. Cor: PMI nondisplaced. Irregularly irregular. No M/G/R noted Lungs: Bibasilar crackles. Abdomen: Soft, non-tender, non-distended, no HSM. No bruits or masses. +BS  Extremities: No cyanosis, clubbing, or rash. RUE AVF.  Neuro: Alert & orientedx3, cranial nerves grossly intact. moves all 4 extremities w/o difficulty. Affect pleasant   Telemetry   Remains in Afib in 80-90s, personally reviewed.   EKG    No new tracings.   Labs    CBC Recent Labs    07/08/17 0653 07/09/17 0741  WBC 11.1* 10.7*  NEUTROABS 9.0*  --   HGB 12.4 11.8*  HCT 38.6 38.0  MCV 98.5 100.0  PLT 193 160   Basic Metabolic Panel Recent Labs   07/07/17 2214 07/08/17 0653 07/09/17 0635  NA 134* 137 135  K 4.5 3.8 4.7  CL 98* 102 97*  CO2 23 27 26   GLUCOSE 216* 86 208*  BUN 103* 51* 33*  CREATININE 2.50* 1.67* 1.82*  CALCIUM 9.9 8.8* 8.4*  PHOS 5.2*  --   --    Liver Function Tests Recent Labs    07/06/17 1430 07/07/17 2214  ALT 61*  --   ALBUMIN  --  3.1*   No results for input(s): LIPASE, AMYLASE in the last 72 hours. Cardiac Enzymes No results for input(s): CKTOTAL, CKMB, CKMBINDEX, TROPONINI in the last 72 hours.  BNP: BNP (last 3 results) Recent Labs    06/26/17 0806 07/02/17 1131 07/03/17 0501  BNP 2,226.0* 1,605.1* 2,179.2*    ProBNP (last 3 results) No results for input(s): PROBNP in the last 8760 hours.   D-Dimer No results for input(s): DDIMER in the last 72 hours. Hemoglobin A1C No results for input(s): HGBA1C in the last 72 hours. Fasting Lipid Panel No results for input(s): CHOL, HDL, LDLCALC, TRIG, CHOLHDL, LDLDIRECT in the last 72 hours. Thyroid Function Tests No results for input(s): TSH, T4TOTAL, T3FREE, THYROIDAB in the last 72 hours.  Invalid input(s): FREET3  Other results:   Imaging    No results found.   Medications:  Scheduled Medications: . amiodarone  200 mg Oral Daily  . calcitRIOL  0.25 mcg Oral Daily  . feeding supplement (ENSURE ENLIVE)  237 mL Oral TID BM  . ferrous sulfate  325 mg Oral BID  . insulin aspart  0-20 Units Subcutaneous TID WC  . insulin aspart  0-5 Units Subcutaneous QHS  . insulin aspart  3 Units Subcutaneous TID WC  . insulin glargine  7 Units Subcutaneous Daily  . isosorbide mononitrate  30 mg Oral Daily  . levothyroxine  50 mcg Oral QAC breakfast  . multivitamin with minerals  1 tablet Oral Daily  . polyethylene glycol  17 g Oral Daily  . pravastatin  80 mg Oral QHS  . sodium chloride flush  3 mL Intravenous Q12H  . sodium chloride flush  3 mL Intravenous Q12H  . Warfarin - Pharmacist Dosing Inpatient   Does not apply q1800     Infusions: . sodium chloride    . sodium chloride      PRN Medications: sodium chloride, acetaminophen **OR** acetaminophen, albuterol, guaiFENesin-dextromethorphan, midodrine, ondansetron **OR** ondansetron (ZOFRAN) IV, oxyCODONE-acetaminophen **AND** oxyCODONE, senna-docusate, sodium chloride flush, sodium chloride flush, zolpidem    Patient Profile   72 yo with history of chronic systolic CHF from nonischemic cardiomyopathy, prior PE, CKD IV-V, and paroxysmal atrial fibrillation presented with dyspnea and progressive renal dysfunction.   Assessment/Plan   1. Acute on chronic systolic CHF: Echo (6/28) with EF 20-25%, nonischemic cardiomyopathy.  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  On exam today, she is volume overloaded though she is comfortable at rest.  Suspect this is potentiated by progressive renal failure. - Suspect the majority of her dyspnea is due to CHF, COPD probably plays a lesser role.  Tolerated HD 1/15 and undergoing currently.  - Coreg stopped 1/14/. 2. 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  - Appears to be new this admission on HD, but had AVF in place - Nephrology following.   3. Atrial fibrillation: Persistent.  Was in NSR in 9/18 but has been in atrial fibrillation since admitted this time.   She has been on amiodarone at home.  - Continue amiodarone to 200 mg daily.  - Continue warfarin, INR pending.  - Will need TEE /DC-CV once volume status better. Arranged for tomorrow.    4. OSA:  - Continue nightly CPAP.  5. COPD:  - On Solumedrol per primary team.  Suspect CHF is main issue. No change 6. Deconditioning: - PT/OT recommending SNF.   Length of Stay: [place], [place]  07/09/2017, 10:52 AM  Advanced Heart Failure Team Pager [phone] (M-F; 7a - 4p)  Please contact [facility] for night-coverage after hours (4p -7a ) and weekends on [email or web address]  Patient seen with PA,  agree with the above note.  INR 2.06.  She had HD again today.  She remains in atrial fibrillation.  Will plan for TEE-guided DCCV tomorrow.    Loralie Champagne 07/09/2017 3:43 PM

## 2017-07-09 NOTE — Progress Notes (Signed)
PROGRESS NOTE    [name]  WCB:[id number] DOB: April 10, 1946 DOA: 07/02/2017 PCP: Lemmie Evens, MD    Brief Narrative:  72 yo female presents with dyspnea and generalized weakness. Patient is known to have a significant medical history of nonischemic cardiomyopathy EF 20%, atrial fibrillation, type 2 diabetes mellitus, stage IV chronic kidney disease. Complaint of worsening dyspnea for last 4 days, associated with wheezing and decreased functional physical capacity. On initial physical examination blood pressure 101/78, heart rate 73, oxygen saturation 95% on supplemental oxygen, moist mucous membranes, lungs clear to auscultation bilaterally, heart S1-S2 present rhythmic irregularly irregular, the abdomen was soft nontender, no lower extremity edema. Sodium 135, potassium 4.4, chloride 99, bicarbonate 17, glucose 29, BUN 109, creatinine 4.11, white count 7.4, hemoglobin 11.1, hematocrit 35.5, platelets 242. BNP 1605, EKG left bundle branch block, atrial fibrillation 95 beats per minute, left axis deviation.    Patient was admitted with working diagnosis of COPD exacerbation, complicated with acute on chronic systolic heart failure.    Assessment & Plan:   Principal Problem:   COPD (chronic obstructive pulmonary disease) (HCC) Active Problems:   Dyslipidemia   HTN (hypertension)   History of pulmonary embolism   Type 2 diabetes, uncontrolled, with renal manifestation (HCC)   Chronic renal disease, stage 4, severely decreased glomerular filtration rate (GFR) between 15-29 mL/min/1.73 square meter (HCC)   Chronic anticoagulation   Atrial fibrillation (HCC)   Implantable cardioverter-defibrillator-CRT- Mdt   Anemia in chronic renal disease   Hypothyroidism   SOB (shortness of breath)   Acute on chronic systolic CHF (congestive heart failure), NYHA class 4 (HCC)   Palliative care by specialist   Goals of care, counseling/discussion   Generalized weakness   Nonischemic  cardiomyopathy ([place])   Hyperglycemia   Dependence on renal dialysis ([place])   Leukocytosis   Acute blood loss anemia   1. Systolic heart failure decompensation. Continue to volume status with ultrafiltration, on after load reducing agents, isosorbide mononitrate. Out of bed as tolerated, very deconditioned.   2. COPD exacerbation. On duoneb, continue oxymetry monitoring,   3. Acute on chronic kidney disease stage IV started HD on this admission. Had HD today with ultrafiltration in preparation for cardioversion in am, will continue close follow up on renal function and electrolytes.  4. Paroxysmal atrial fibrillation. Persistebt atrial fibrillation, for electrical cardioversion in am. Continue po amiodarone, anticoagulation with warfarin. INR 2.0  5. Type 2 diabetes mellitus. Insulin sliding scale for glucose cover and monitoring, capillary glucose 150, 42, 54, 125, 149. Will continue to reduce to reduce insulin therapy.  6. Hypothyroidism. On levothyroxine.   DVT prophylaxis: warfarin  Code Status: full Family Communication: I spoke with patient's daughter at the bedside and all questions were addressed.  Disposition Plan: home or snf   Consultants:   Cardiology  nephrology  Procedures:     Antimicrobials:       Subjective: Chest burning sensation has improved, continue to have significant weakness no exertion, no chest pain, no nausea or vomiting, tolerated HD well.   Objective: Vitals:   07/09/17 1100 07/09/17 1130 07/09/17 1200 07/09/17 1215  BP: 106/68 102/76 (!) 112/52 117/65  Pulse: 92 98 98 99  Resp: 15 16 17 18   Temp:    97.9 F (36.6 C)  TempSrc:    Oral  SpO2:    95%  Weight:    85 kg (187 lb 6.3 oz)  Height:        Intake/Output Summary (Last 24 hours)  at 07/09/2017 1247 Last data filed at 07/09/2017 1215 Gross per 24 hour  Intake 720 ml  Output 2000 ml  Net -1280 ml   Filed Weights   07/09/17 0616 07/09/17 0830 07/09/17 1215    Weight: 85.6 kg (188 lb 11.4 oz) 86 kg (189 lb 9.5 oz) 85 kg (187 lb 6.3 oz)    Examination:   General: Not in pain or dyspnea, deconditioned Neurology: Awake and alert, non focal  E ENT: mild pallor, no icterus, oral mucosa moist Cardiovascular: No JVD. S1-S2 present, irregularly irregular  no gallops, rubs, or murmurs. ++ lower extremity edema/ non pitting. Pulmonary: decreased breath sounds bilaterally at bases, decreased air movement, no wheezing, rhonchi or rales. Gastrointestinal. Abdomen protuberant no organomegaly, non tender, no rebound or guarding Skin. No rashes Musculoskeletal: no joint deformities     Data Reviewed: I have personally reviewed following labs and imaging studies  CBC: Recent Labs  Lab 07/05/17 0844 07/06/17 1430 07/07/17 2213 07/08/17 0653 07/09/17 0741  WBC 8.8 11.6* 12.5* 11.1* 10.7*  NEUTROABS  --   --   --  9.0*  --   HGB 11.4* 11.3* 12.5 12.4 11.8*  HCT 35.6* 34.7* 38.4 38.6 38.0  MCV 97.8 95.9 97.0 98.5 100.0  PLT 203 217 216 193 009   Basic Metabolic Panel: Recent Labs  Lab 07/03/17 0501  07/06/17 0624 07/07/17 0623 07/07/17 2214 07/08/17 0653 07/09/17 0635  NA 134*   < > 130* 132* 134* 137 135  K 4.5   < > 5.0 4.3 4.5 3.8 4.7  CL 99*   < > 98* 98* 98* 102 97*  CO2 19*   < > 18* 22 23 27 26   GLUCOSE 241*   < > 156* 131* 216* 86 208*  BUN 115*   < > 158* 96* 103* 51* 33*  CREATININE 4.14*   < > 3.38* 2.38* 2.50* 1.67* 1.82*  CALCIUM 9.3   < > 9.9 9.4 9.9 8.8* 8.4*  MG 3.3*  --   --   --   --   --   --   PHOS  --   --   --   --  5.2*  --   --    < > = values in this interval not displayed.   GFR: Estimated Creatinine Clearance: 29.6 mL/min (A) (by C-G formula based on SCr of 1.82 mg/dL (H)). Liver Function Tests: Recent Labs  Lab 07/06/17 1430 07/07/17 2214  ALT 61*  --   ALBUMIN  --  3.1*   No results for input(s): LIPASE, AMYLASE in the last 168 hours. No results for input(s): AMMONIA in the last 168  hours. Coagulation Profile: Recent Labs  Lab 07/05/17 0844 07/06/17 0624 07/07/17 0623 07/08/17 0653 07/09/17 0635  INR 2.02 1.94 1.86 1.86 2.06   Cardiac Enzymes: No results for input(s): CKTOTAL, CKMB, CKMBINDEX, TROPONINI in the last 168 hours. BNP (last 3 results) No results for input(s): PROBNP in the last 8760 hours. HbA1C: No results for input(s): HGBA1C in the last 72 hours. CBG: Recent Labs  Lab 07/08/17 1128 07/08/17 2145 07/08/17 2205 07/08/17 2250 07/09/17 1231  GLUCAP 150* 42* 54* 124* 149*   Lipid Profile: No results for input(s): CHOL, HDL, LDLCALC, TRIG, CHOLHDL, LDLDIRECT in the last 72 hours. Thyroid Function Tests: No results for input(s): TSH, T4TOTAL, FREET4, T3FREE, THYROIDAB in the last 72 hours. Anemia Panel: No results for input(s): VITAMINB12, FOLATE, FERRITIN, TIBC, IRON, RETICCTPCT in the last 72 hours.  Radiology Studies: I have reviewed all of the imaging during this hospital visit personally     Scheduled Meds: . amiodarone  200 mg Oral Daily  . calcitRIOL  0.25 mcg Oral Daily  . feeding supplement (NEPRO CARB STEADY)  237 mL Oral BID BM  . feeding supplement (PRO-STAT SUGAR FREE 64)  30 mL Oral TID BM  . ferrous sulfate  325 mg Oral BID  . insulin aspart  0-20 Units Subcutaneous TID WC  . insulin aspart  0-5 Units Subcutaneous QHS  . insulin aspart  3 Units Subcutaneous TID WC  . insulin glargine  7 Units Subcutaneous Daily  . isosorbide mononitrate  30 mg Oral Daily  . levothyroxine  50 mcg Oral QAC breakfast  . multivitamin with minerals  1 tablet Oral Daily  . polyethylene glycol  17 g Oral Daily  . pravastatin  80 mg Oral QHS  . sodium chloride flush  3 mL Intravenous Q12H  . sodium chloride flush  3 mL Intravenous Q12H  . warfarin  2.5 mg Oral ONCE-1800  . Warfarin - Pharmacist Dosing Inpatient   Does not apply q1800   Continuous Infusions: . sodium chloride    . sodium chloride       LOS: 7 days         Tawni Millers, MD [facility] Pager [phone]

## 2017-07-09 NOTE — Progress Notes (Signed)
Patient had an episode of hypoglycemia last night. Patient sweaty, tired, weak. Blood sugar checked- 42, 54, 126. Will continue to monitor.

## 2017-07-09 NOTE — Clinical Social Work Note (Signed)
Clinical Social Work Assessment  Patient Details  Name: [name] MRN: [id number] Date of Birth: 10/21/1945  Date of referral:  07/09/17               Reason for consult:  Facility Placement, Discharge Planning                Permission sought to share information with:  Chartered certified accountant granted to share information::  Yes, Verbal Permission Granted  Name::        Agency::  SNF's  Relationship::     Contact Information:     Housing/Transportation Living arrangements for the past 2 months:  Single Family Home Source of Information:  Patient, Medical Team Patient Interpreter Needed:  None Criminal Activity/Legal Involvement Pertinent to Current Situation/Hospitalization:  No - Comment as needed Significant Relationships:  Spouse Lives with:  Spouse Do you feel safe going back to the place where you live?  Yes Need for family participation in patient care:  Yes (Comment)  Care giving concerns:  PT recommending SNF once medically stable for discharge.   Social Worker assessment / plan:  CSW met with patient. No supports at bedside. CSW introduced role and explained that PT recommendations would be discussed. Patient agreeable to SNF placement. No preference of facility. SNF list provided for her to review with her husband. No further concerns. CSW encouraged patient to contact CSW as needed. CSW will continue to follow patient for support and facilitate discharge to SNF once medically stable.  Employment status:  Retired Nurse, adult PT Recommendations:  [place] / Referral to community resources:  [place]  Patient/Family's Response to care:  Patient agreeable to SNF placement. Patient's husband supportive and involved in patient's care. Patient appreciated social work intervention.  Patient/Family's Understanding of and Emotional Response to Diagnosis, Current Treatment, and  Prognosis:  Patient has a good understanding of the reason for admission and her need for rehab prior to returning home. Patient appears happy with hospital care.  Emotional Assessment Appearance:  Appears stated age Attitude/Demeanor/Rapport:  Engaged, Gracious Affect (typically observed):  Accepting, Appropriate, Calm, Pleasant Orientation:  Oriented to Self, Oriented to Place, Oriented to  Time, Oriented to Situation Alcohol / Substance use:  Tobacco Use Psych involvement (Current and /or in the community):  No (Comment)  Discharge Needs  Concerns to be addressed:  Care Coordination Readmission within the last 30 days:  Yes Current discharge risk:  Dependent with Mobility Barriers to Discharge:  Continued Medical Work up, [place], LCSW 07/09/2017, 4:36 PM

## 2017-07-09 NOTE — Progress Notes (Signed)
Physical Therapy Re-evaluation & Treatment Patient Details Name: [name] MRN: [id number] DOB: 1946-01-08 Today's Date: 07/09/2017    History of Present Illness [name] is a 72 y.o. female who presented to the ED on 07/02/17 with increasing SOB since d/c on 06/30/17. Found to have COPD exacerbation and possibly worsening CHF. PMH includes nonischemic cardiomyopathy (EF of 20%), A. fib, DM2, CKD stage IV, anemia of CKD, obstructive sleep apnea on CPAP, dyslipidemia, obesity.    PT Comments    Pt presents with an overall decrease in functional mobility secondary to above. Able to ambulate very short distance with RW and close min guard for balance; mobility limited by decreased activity tolerance, generalized weakness, and fatigue. Continue to recommend SNF-level therapies at d/c to maximize functional mobility and independence prior to return home. Will follow acutely.   Follow Up Recommendations  SNF     Equipment Recommendations  None recommended by PT    Recommendations for Other Services       Precautions / Restrictions Precautions Precautions: Fall Restrictions Weight Bearing Restrictions: No    Mobility  Bed Mobility Overal bed mobility: Needs Assistance Bed Mobility: Supine to Sit     Supine to sit: Mod assist;HOB elevated     General bed mobility comments: ModA for UE support to assist trunk elevation; increased time and effort with cues for pt to attempt to be as indep as possible since she was requesting for assist  Transfers Overall transfer level: Needs assistance Equipment used: Rolling walker (2 wheeled) Transfers: Sit to/from Stand Sit to Stand: Min guard;From elevated surface         General transfer comment: min guard for safety, cues for hand placement  Ambulation/Gait Ambulation/Gait assistance: Min guard Ambulation Distance (Feet): 15 Feet Assistive device: Rolling walker (2 wheeled) Gait Pattern/deviations: Step-through  pattern;Decreased stride length Gait velocity: Decreased Gait velocity interpretation: <1.8 ft/sec, indicative of risk for recurrent falls General Gait Details: Pt only agreeable to amb in room, using RW and min guard for balance. Increased SOB and c/o fatigue   Stairs            Wheelchair Mobility    Modified Rankin (Stroke Patients Only)       Balance Overall balance assessment: Needs assistance Sitting-balance support: Bilateral upper extremity supported;Feet supported;Feet unsupported Sitting balance-Leahy Scale: Fair     Standing balance support: Bilateral upper extremity supported Standing balance-Leahy Scale: Poor                              Cognition Arousal/Alertness: Awake/alert Behavior During Therapy: Flat affect Overall Cognitive Status: Within Functional Limits for tasks assessed                                        Exercises      General Comments        Pertinent Vitals/Pain Pain Assessment: Faces Faces Pain Scale: Hurts even more Pain Location: back Pain Descriptors / Indicators: Sore;Aching Pain Intervention(s): Monitored during session;Repositioned    Home Living                      Prior Function            PT Goals (current goals can now be found in the care plan section) Acute Rehab PT Goals Patient Stated Goal: Decreased  back pain PT Goal Formulation: With patient Time For Goal Achievement: 07/23/17 Potential to Achieve Goals: Fair Progress towards PT goals: Progressing toward goals    Frequency    Min 2X/week      PT Plan Discharge plan needs to be updated;Frequency needs to be updated    Co-evaluation              AM-PAC PT "6 Clicks" Daily Activity  Outcome Measure  Difficulty turning over in bed (including adjusting bedclothes, sheets and blankets)?: Unable Difficulty moving from lying on back to sitting on the side of the bed? : Unable Difficulty sitting down on  and standing up from a chair with arms (e.g., wheelchair, bedside commode, etc,.)?: A Little Help needed moving to and from a bed to chair (including a wheelchair)?: A Little Help needed walking in hospital room?: A Little Help needed climbing 3-5 steps with a railing? : A Lot 6 Click Score: 13    End of Session Equipment Utilized During Treatment: Gait belt Activity Tolerance: Patient limited by pain;Patient limited by fatigue Patient left: in chair;with call bell/phone within reach;with chair alarm set Nurse Communication: Mobility status PT Visit Diagnosis: Other abnormalities of gait and mobility (R26.89);Muscle weakness (generalized) (M62.81)     Time: 3474-2595 PT Time Calculation (min) (ACUTE ONLY): 16 min  Charges:                       G CodesDerry Lory 08-03-2017, 4:08 PM

## 2017-07-09 NOTE — NC FL2 (Signed)
[place] LEVEL OF CARE SCREENING TOOL     IDENTIFICATION  Patient Name: [name] Birthdate: 25-May-1946 Sex: female Admission Date (Current Location): 07/02/2017  [facility] [place] Number:  Herbalist and Address:  [facility] [place], [place] [address], [place], [place] [address]      Provider Number: [id number]  Attending Physician Name and Address:  Tawni Millers  Relative Name and Phone Number:       Current Level of Care: Hospital Recommended Level of Care: [place] Prior Approval Number:    Date Approved/Denied:   PASRR Number: [id number]  Discharge Plan: SNF    Current Diagnoses: Patient Active Problem List   Diagnosis Date Noted  . Nonischemic cardiomyopathy ([place])   . Hyperglycemia   . Dependence on renal dialysis ([place])   . Leukocytosis   . Acute blood loss anemia   . Palliative care by specialist   . Goals of care, counseling/discussion   . Generalized weakness   . Acute on chronic systolic CHF (congestive heart failure), NYHA class 4 ([place]) 07/02/2017  . Acute on chronic systolic (congestive) heart failure ([place]) 06/26/2017  . UTI (urinary tract infection) 06/26/2017  . Obesity (BMI 30-39.9) 09/15/2016  . OSA (obstructive sleep apnea) 09/14/2016  . Anemia due to GI blood loss 02/26/2016  . Acute respiratory failure with hypoxia ([place]) 01/08/2015  . Paroxysmal atrial fibrillation ([place]) 01/08/2015  . Diabetes mellitus type 2 with complications ([place]) 56/21/3086  . Healthcare-associated pneumonia 01/07/2015  . HCAP (healthcare-associated pneumonia) 01/07/2015  . ICD (implantable cardioverter-defibrillator) infection ([place])   . Enterococcal bacteremia   . Diabetic feet ([place])   . Bacteremia 12/07/2014  . SOB (shortness of breath)   . Acute on chronic combined systolic and diastolic CHF (congestive heart failure) ([place]) 12/01/2014  . COPD (chronic obstructive pulmonary disease) ([place])  11/30/2014  . Anemia in chronic renal disease 11/30/2014  . Hypothyroidism 11/30/2014  . Fever 11/30/2014  . Symptomatic anemia 10/17/2014  . Implantable cardioverter-defibrillator-CRT- Mdt   . Atrial fibrillation ([place]) 06/19/2013  . Chronic anticoagulation 08/19/2012  . Chronic combined systolic and diastolic CHF (congestive heart failure) ([place]) 08/03/2012  . History of pulmonary embolism   . Type 2 diabetes, uncontrolled, with renal manifestation ([place])   . Tobacco abuse, in remission   . Chronic renal disease, stage 4, severely decreased glomerular filtration rate (GFR) between 15-29 mL/min/1.73 square meter (HCC)   . LBBB 01/29/2009  . Dyslipidemia 06/04/2007  . HTN (hypertension) 06/04/2007    Orientation RESPIRATION BLADDER Height & Weight     Self, Time, Situation, Place  Normal Continent, External catheter Weight: 187 lb 6.3 oz (85 kg) Height:  5\' 5"  (165.1 cm)  BEHAVIORAL SYMPTOMS/MOOD NEUROLOGICAL BOWEL NUTRITION STATUS  (None) (None) Continent Diet(Heart healthy/carb modified)  AMBULATORY STATUS COMMUNICATION OF NEEDS Skin   Limited Assist Verbally Bruising, Other (Comment)(Excoriated, MASD.)                       Personal Care Assistance Level of Assistance  Bathing, Feeding, Dressing Bathing Assistance: Limited assistance Feeding assistance: Limited assistance Dressing Assistance: Limited assistance     Functional Limitations Info  Sight, Hearing, Speech Sight Info: Adequate Hearing Info: Adequate Speech Info: Adequate    SPECIAL CARE FACTORS FREQUENCY  PT (By licensed PT), OT (By licensed OT), Blood pressure     PT Frequency: 5 x week OT Frequency: 5 x week  Contractures Contractures Info: Not present    Additional Factors Info  Code Status, Allergies Code Status Info: Full Allergies Info: NKDA           Current Medications (07/09/2017):  This is the current hospital active medication list Current Facility-Administered  Medications  Medication Dose Route Frequency Provider Last Rate Last Dose  . 0.9 %  sodium chloride infusion  250 mL Intravenous PRN Lady Deutscher, MD      . 0.9 %  sodium chloride infusion  250 mL Intravenous Continuous Clegg, Amy D, NP      . acetaminophen (TYLENOL) tablet 650 mg  650 mg Oral Q6H PRN Lady Deutscher, MD       Or  . acetaminophen (TYLENOL) suppository 650 mg  650 mg Rectal Q6H PRN Lady Deutscher, MD      . albuterol (PROVENTIL) (2.5 MG/3ML) 0.083% nebulizer solution 2.5 mg  2.5 mg Nebulization Q2H PRN Lady Deutscher, MD      . amiodarone (PACERONE) tablet 200 mg  200 mg Oral Daily Larey Dresser, MD   200 mg at 07/09/17 1315  . calcitRIOL (ROCALTROL) capsule 0.25 mcg  0.25 mcg Oral Daily Lady Deutscher, MD   0.25 mcg at 07/09/17 1315  . feeding supplement (NEPRO CARB STEADY) liquid 237 mL  237 mL Oral BID BM Arrien, Jimmy Picket, MD   237 mL at 07/09/17 1400  . feeding supplement (PRO-STAT SUGAR FREE 64) liquid 30 mL  30 mL Oral TID BM Arrien, Jimmy Picket, MD   30 mL at 07/09/17 1316  . ferrous sulfate tablet 325 mg  325 mg Oral BID Lady Deutscher, MD   325 mg at 07/09/17 1315  . guaiFENesin-dextromethorphan (ROBITUSSIN DM) 100-10 MG/5ML syrup 5 mL  5 mL Oral Q4H PRN Alma Friendly, MD   5 mL at 07/08/17 1359  . insulin aspart (novoLOG) injection 0-20 Units  0-20 Units Subcutaneous TID WC Alma Friendly, MD   3 Units at 07/09/17 1317  . insulin aspart (novoLOG) injection 0-5 Units  0-5 Units Subcutaneous QHS Lady Deutscher, MD   2 Units at 07/05/17 2209  . [START ON 07/10/2017] insulin glargine (LANTUS) injection 5 Units  5 Units Subcutaneous Daily Arrien, Jimmy Picket, MD      . isosorbide mononitrate (IMDUR) 24 hr tablet 30 mg  30 mg Oral Daily Lady Deutscher, MD   30 mg at 07/09/17 1315  . levothyroxine (SYNTHROID, LEVOTHROID) tablet 50 mcg  50 mcg Oral QAC breakfast Lady Deutscher, MD   50 mcg at 07/09/17 0547  .  midodrine (PROAMATINE) tablet 5 mg  5 mg Oral Q dialysis Madelon Lips, MD   5 mg at 07/09/17 0804  . multivitamin with minerals tablet 1 tablet  1 tablet Oral Daily Alma Friendly, MD   1 tablet at 07/09/17 1316  . ondansetron (ZOFRAN) tablet 4 mg  4 mg Oral Q6H PRN Lady Deutscher, MD   4 mg at 07/08/17 1246   Or  . ondansetron (ZOFRAN) injection 4 mg  4 mg Intravenous Q6H PRN Lady Deutscher, MD      . oxyCODONE-acetaminophen (PERCOCET/ROXICET) 5-325 MG per tablet 1 tablet  1 tablet Oral Q6H PRN Lady Deutscher, MD   1 tablet at 07/07/17 2055   And  . oxyCODONE (Oxy IR/ROXICODONE) immediate release tablet 5 mg  5 mg Oral Q6H PRN Lady Deutscher, MD   5 mg at 07/09/17 1404  .  polyethylene glycol (MIRALAX / GLYCOLAX) packet 17 g  17 g Oral Daily Lady Deutscher, MD   17 g at 07/09/17 1316  . pravastatin (PRAVACHOL) tablet 80 mg  80 mg Oral QHS Lady Deutscher, MD   80 mg at 07/08/17 2139  . senna-docusate (Senokot-S) tablet 2 tablet  2 tablet Oral Daily PRN Lady Deutscher, MD   2 tablet at 07/08/17 1245  . sodium chloride flush (NS) 0.9 % injection 3 mL  3 mL Intravenous Q12H Lady Deutscher, MD   3 mL at 07/08/17 1053  . sodium chloride flush (NS) 0.9 % injection 3 mL  3 mL Intravenous PRN Lady Deutscher, MD      . sodium chloride flush (NS) 0.9 % injection 3 mL  3 mL Intravenous Q12H Clegg, Amy D, NP   3 mL at 07/09/17 1319  . sodium chloride flush (NS) 0.9 % injection 3 mL  3 mL Intravenous PRN Clegg, Amy D, NP      . warfarin (COUMADIN) tablet 2.5 mg  2.5 mg Oral ONCE-1800 Arrien, Jimmy Picket, MD      . Warfarin - Pharmacist Dosing Inpatient   Does not apply [id number] Alma Friendly, MD      . zolpidem (AMBIEN) tablet 5 mg  5 mg Oral QHS PRN Lionel December Karsten Fells, NP         Discharge Medications: Please see discharge summary for a list of discharge medications.  Relevant Imaging Results:  Relevant Lab Results:   Additional  Information SS#: [id number]. New HD.  Candie Chroman, LCSW

## 2017-07-09 NOTE — Progress Notes (Signed)
PT Cancellation Note  Patient Details Name: [name] MRN: [id number] DOB: 1945/10/26   Cancelled Treatment:    Reason Eval/Treat Not Completed: Patient at procedure or test/unavailable. Will follow-up for PT re-evaluation today as schedule permits.  Mabeline Caras, PT, DPT Acute Rehab Services  Pager: [phone] 07/09/2017, 8:45 AM

## 2017-07-09 NOTE — Progress Notes (Signed)
ANTICOAGULATION CONSULT NOTE - Follow Up Consult  Pharmacy Consult for warfarin Indication: atrial fibrillation and VTE prophylaxis  No Known Allergies  Patient Measurements: Height: 5\' 5"  (165.1 cm) Weight: 189 lb 9.5 oz (86 kg) IBW/kg (Calculated) : 57   Vital Signs: Temp: 97.9 F (36.6 C) (01/17 0830) Temp Source: Oral (01/17 0830) BP: 103/78 (01/17 1030) Pulse Rate: 105 (01/17 1030)  Labs: Recent Labs    07/07/17 0623 07/07/17 2213 07/07/17 2214 07/08/17 0653 07/09/17 0635 07/09/17 0741  HGB  --  12.5  --  12.4  --  11.8*  HCT  --  38.4  --  38.6  --  38.0  PLT  --  216  --  193  --  191  LABPROT 21.2*  --   --  21.3* 23.0*  --   INR 1.86  --   --  1.86 2.06  --   CREATININE 2.38*  --  2.50* 1.67* 1.82*  --     Estimated Creatinine Clearance: 29.8 mL/min (A) (by C-G formula based on SCr of 1.82 mg/dL (H)).  Medications:  Scheduled:  . amiodarone  200 mg Oral Daily  . calcitRIOL  0.25 mcg Oral Daily  . feeding supplement (ENSURE ENLIVE)  237 mL Oral TID BM  . ferrous sulfate  325 mg Oral BID  . insulin aspart  0-20 Units Subcutaneous TID WC  . insulin aspart  0-5 Units Subcutaneous QHS  . insulin aspart  3 Units Subcutaneous TID WC  . insulin glargine  7 Units Subcutaneous Daily  . isosorbide mononitrate  30 mg Oral Daily  . levothyroxine  50 mcg Oral QAC breakfast  . multivitamin with minerals  1 tablet Oral Daily  . polyethylene glycol  17 g Oral Daily  . pravastatin  80 mg Oral QHS  . sodium chloride flush  3 mL Intravenous Q12H  . sodium chloride flush  3 mL Intravenous Q12H  . warfarin  2.5 mg Oral ONCE-1800  . Warfarin - Pharmacist Dosing Inpatient   Does not apply q1800    Assessment: 72 y.o female presented with SOB and recent admit on 1/4 for CHF exacerbation.  On warfarin for afib prior to admission with therapeutic INR 3 upon admission.   Home regimen: 1.25 mg on MWF and 2.5 mg all other days (weekly dose 13.75mg ).  INR trended down from  3.1 to 2.02 after holding dose 1/11.  INR trended down further 1.86 > 2.06 after boost doses.     Plan TEE/DCCV tomorrow will keep INR > 2. Amiodarone dose slightly higher than home dose.  No overt bleeding or complications noted.  Awaiting SNF placement  Goal of Therapy:  INR 2-3 Monitor platelets by anticoagulation protocol: Yes   Plan:  Warfarin 2.5 mg x1 tonight. Monitor daily INR, CBC, signs/symptoms of bleeding   Bonnita Nasuti Pharm.D. CPP, BCPS Clinical Pharmacist [phone] 07/09/2017 11:00 AM

## 2017-07-09 NOTE — Plan of Care (Signed)
  Education: Knowledge of General Education information will improve 07/09/2017 0234 - Progressing by Tristan Schroeder, RN   Health Behavior/Discharge Planning: Ability to manage health-related needs will improve 07/09/2017 0234 - Progressing by Tristan Schroeder, RN   Clinical Measurements: Ability to maintain clinical measurements within normal limits will improve 07/09/2017 0234 - Progressing by Tristan Schroeder, RN

## 2017-07-09 NOTE — H&P (View-Only) (Signed)
Patient ID: [name], female   DOB: 1945-12-23, 72 y.o.   MRN: [id number]     Advanced Heart Failure Rounding Note  Primary Cardiologist: Aundra Dubin  Subjective:    Tolerating HD currently.   Continues to complain of fatigue. Denies SOB or pain.   BP soft in 80-100s. Weight stable.  Objective:   Weight Range: 189 lb 9.5 oz (86 kg) Body mass index is 31.55 kg/m.   Vital Signs:   Temp:  [97.5 F (36.4 C)-97.9 F (36.6 C)] 97.9 F (36.6 C) (01/17 0830) Pulse Rate:  [42-119] 105 (01/17 1030) Resp:  [13-20] 17 (01/17 1030) BP: (82-124)/(45-82) 103/78 (01/17 1030) SpO2:  [92 %-100 %] 95 % (01/17 0830) Weight:  [188 lb 11.4 oz (85.6 kg)-192 lb 7.4 oz (87.3 kg)] 189 lb 9.5 oz (86 kg) (01/17 0830) Last BM Date: 07/08/17  Weight change: Filed Weights   07/08/17 1851 07/09/17 0616 07/09/17 0830  Weight: 189 lb 13.1 oz (86.1 kg) 188 lb 11.4 oz (85.6 kg) 189 lb 9.5 oz (86 kg)    Intake/Output:   Intake/Output Summary (Last 24 hours) at 07/09/2017 1052 Last data filed at 07/09/2017 0500 Gross per 24 hour  Intake 720 ml  Output 1000 ml  Net -280 ml      Physical Exam   Undergoing HD currently  General: Chronically ill and elderly appearing.  HEENT: Normal Neck: Supple. JVP ~9-10 cm. Carotids 2+ bilat; no bruits. No thyromegaly or nodule noted. Cor: PMI nondisplaced. Irregularly irregular. No M/G/R noted Lungs: Bibasilar crackles. Abdomen: Soft, non-tender, non-distended, no HSM. No bruits or masses. +BS  Extremities: No cyanosis, clubbing, or rash. RUE AVF.  Neuro: Alert & orientedx3, cranial nerves grossly intact. moves all 4 extremities w/o difficulty. Affect pleasant   Telemetry   Remains in Afib in 80-90s, personally reviewed.   EKG    No new tracings.   Labs    CBC Recent Labs    07/08/17 0653 07/09/17 0741  WBC 11.1* 10.7*  NEUTROABS 9.0*  --   HGB 12.4 11.8*  HCT 38.6 38.0  MCV 98.5 100.0  PLT 193 643   Basic Metabolic Panel Recent Labs   07/07/17 2214 07/08/17 0653 07/09/17 0635  NA 134* 137 135  K 4.5 3.8 4.7  CL 98* 102 97*  CO2 23 27 26   GLUCOSE 216* 86 208*  BUN 103* 51* 33*  CREATININE 2.50* 1.67* 1.82*  CALCIUM 9.9 8.8* 8.4*  PHOS 5.2*  --   --    Liver Function Tests Recent Labs    07/06/17 1430 07/07/17 2214  ALT 61*  --   ALBUMIN  --  3.1*   No results for input(s): LIPASE, AMYLASE in the last 72 hours. Cardiac Enzymes No results for input(s): CKTOTAL, CKMB, CKMBINDEX, TROPONINI in the last 72 hours.  BNP: BNP (last 3 results) Recent Labs    06/26/17 0806 07/02/17 1131 07/03/17 0501  BNP 2,226.0* 1,605.1* 2,179.2*    ProBNP (last 3 results) No results for input(s): PROBNP in the last 8760 hours.   D-Dimer No results for input(s): DDIMER in the last 72 hours. Hemoglobin A1C No results for input(s): HGBA1C in the last 72 hours. Fasting Lipid Panel No results for input(s): CHOL, HDL, LDLCALC, TRIG, CHOLHDL, LDLDIRECT in the last 72 hours. Thyroid Function Tests No results for input(s): TSH, T4TOTAL, T3FREE, THYROIDAB in the last 72 hours.  Invalid input(s): FREET3  Other results:   Imaging    No results found.   Medications:  Scheduled Medications: . amiodarone  200 mg Oral Daily  . calcitRIOL  0.25 mcg Oral Daily  . feeding supplement (ENSURE ENLIVE)  237 mL Oral TID BM  . ferrous sulfate  325 mg Oral BID  . insulin aspart  0-20 Units Subcutaneous TID WC  . insulin aspart  0-5 Units Subcutaneous QHS  . insulin aspart  3 Units Subcutaneous TID WC  . insulin glargine  7 Units Subcutaneous Daily  . isosorbide mononitrate  30 mg Oral Daily  . levothyroxine  50 mcg Oral QAC breakfast  . multivitamin with minerals  1 tablet Oral Daily  . polyethylene glycol  17 g Oral Daily  . pravastatin  80 mg Oral QHS  . sodium chloride flush  3 mL Intravenous Q12H  . sodium chloride flush  3 mL Intravenous Q12H  . Warfarin - Pharmacist Dosing Inpatient   Does not apply q1800     Infusions: . sodium chloride    . sodium chloride      PRN Medications: sodium chloride, acetaminophen **OR** acetaminophen, albuterol, guaiFENesin-dextromethorphan, midodrine, ondansetron **OR** ondansetron (ZOFRAN) IV, oxyCODONE-acetaminophen **AND** oxyCODONE, senna-docusate, sodium chloride flush, sodium chloride flush, zolpidem    Patient Profile   72 yo with history of chronic systolic CHF from nonischemic cardiomyopathy, prior PE, CKD IV-V, and paroxysmal atrial fibrillation presented with dyspnea and progressive renal dysfunction.   Assessment/Plan   1. Acute on chronic systolic CHF: Echo (2/70) with EF 20-25%, nonischemic cardiomyopathy.  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  On exam today, she is volume overloaded though she is comfortable at rest.  Suspect this is potentiated by progressive renal failure. - Suspect the majority of her dyspnea is due to CHF, COPD probably plays a lesser role.  Tolerated HD 1/15 and undergoing currently.  - Coreg stopped 1/14/. 2. 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  - Appears to be new this admission on HD, but had AVF in place - Nephrology following.   3. Atrial fibrillation: Persistent.  Was in NSR in 9/18 but has been in atrial fibrillation since admitted this time.   She has been on amiodarone at home.  - Continue amiodarone to 200 mg daily.  - Continue warfarin, INR pending.  - Will need TEE /DC-CV once volume status better. Arranged for tomorrow.    4. OSA:  - Continue nightly CPAP.  5. COPD:  - On Solumedrol per primary team.  Suspect CHF is main issue. No change 6. Deconditioning: - PT/OT recommending SNF.   Length of Stay: [place], [place]  07/09/2017, 10:52 AM  Advanced Heart Failure Team Pager [phone] (M-F; 7a - 4p)  Please contact [facility] for night-coverage after hours (4p -7a ) and weekends on [email or web address]  Patient seen with PA,  agree with the above note.  INR 2.06.  She had HD again today.  She remains in atrial fibrillation.  Will plan for TEE-guided DCCV tomorrow.    Loralie Champagne 07/09/2017 3:43 PM

## 2017-07-09 NOTE — Progress Notes (Signed)
Patient finally had large BM today.

## 2017-07-10 ENCOUNTER — Inpatient Hospital Stay (HOSPITAL_COMMUNITY): Payer: Medicare HMO | Admitting: Certified Registered Nurse Anesthetist

## 2017-07-10 ENCOUNTER — Encounter (HOSPITAL_COMMUNITY): Payer: Self-pay | Admitting: Certified Registered Nurse Anesthetist

## 2017-07-10 ENCOUNTER — Inpatient Hospital Stay (HOSPITAL_COMMUNITY): Payer: Medicare HMO

## 2017-07-10 ENCOUNTER — Encounter (HOSPITAL_COMMUNITY)
Admission: EM | Disposition: A | Discharge status: Skilled Nursing Facility | Payer: Self-pay | Source: Home / Self Care | Attending: Internal Medicine

## 2017-07-10 ENCOUNTER — Other Ambulatory Visit: Payer: Self-pay

## 2017-07-10 DIAGNOSIS — I4891 Unspecified atrial fibrillation: Secondary | ICD-10-CM

## 2017-07-10 DIAGNOSIS — I340 Nonrheumatic mitral (valve) insufficiency: Secondary | ICD-10-CM

## 2017-07-10 HISTORY — PX: TEE WITHOUT CARDIOVERSION: SHX5443

## 2017-07-10 HISTORY — PX: CARDIOVERSION: SHX1299

## 2017-07-10 LAB — PTH, INTACT AND CALCIUM
Calcium, Total (PTH): 8.3 mg/dL — ABNORMAL LOW (ref 8.7–10.3)
PTH: 461 pg/mL — ABNORMAL HIGH (ref 15–65)

## 2017-07-10 LAB — BASIC METABOLIC PANEL
ANION GAP: 11 (ref 5–15)
BUN: 44 mg/dL — AB (ref 6–20)
CO2: 27 mmol/L (ref 22–32)
Calcium: 8.9 mg/dL (ref 8.9–10.3)
Chloride: 97 mmol/L — ABNORMAL LOW (ref 101–111)
Creatinine, Ser: 2.69 mg/dL — ABNORMAL HIGH (ref 0.44–1.00)
GFR calc Af Amer: 19 mL/min — ABNORMAL LOW (ref >60)
GFR calc non Af Amer: 16 mL/min — ABNORMAL LOW (ref >60)
GLUCOSE: 102 mg/dL — AB (ref 65–99)
POTASSIUM: 4.5 mmol/L (ref 3.5–5.1)
Sodium: 135 mmol/L (ref 135–145)

## 2017-07-10 LAB — PROTIME-INR
INR: 2.63
Prothrombin Time: 27.9 seconds — ABNORMAL HIGH (ref 11.4–15.2)

## 2017-07-10 LAB — GLUCOSE, CAPILLARY
GLUCOSE-CAPILLARY: 110 mg/dL — AB (ref 65–99)
GLUCOSE-CAPILLARY: 153 mg/dL — AB (ref 65–99)
Glucose-Capillary: 96 mg/dL (ref 65–99)

## 2017-07-10 SURGERY — ECHOCARDIOGRAM, TRANSESOPHAGEAL
Anesthesia: Monitor Anesthesia Care

## 2017-07-10 SURGERY — ECHOCARDIOGRAM, TRANSESOPHAGEAL
Anesthesia: Moderate Sedation

## 2017-07-10 MED ORDER — LIDOCAINE-PRILOCAINE 2.5-2.5 % EX CREA: TOPICAL_CREAM | Freq: Every day | CUTANEOUS | Status: DC | PRN

## 2017-07-10 MED ORDER — CALCITRIOL 0.25 MCG PO CAPS
0.2500 ug | ORAL_CAPSULE | ORAL | Status: DC
  Administered 2017-07-14: 0.25 ug via ORAL
  Filled 2017-07-10: qty 1

## 2017-07-10 MED ORDER — WARFARIN SODIUM 1 MG PO TABS
1.0000 mg | ORAL_TABLET | Freq: Once | ORAL | Status: AC
  Administered 2017-07-10: 1 mg via ORAL
  Filled 2017-07-10: qty 1

## 2017-07-10 MED ORDER — PHENYLEPHRINE 40 MCG/ML (10ML) SYRINGE FOR IV PUSH (FOR BLOOD PRESSURE SUPPORT)
PREFILLED_SYRINGE | INTRAVENOUS | Status: DC | PRN
  Administered 2017-07-10 (×3): 80 ug via INTRAVENOUS

## 2017-07-10 MED ORDER — EPHEDRINE SULFATE-NACL 50-0.9 MG/10ML-% IV SOSY
PREFILLED_SYRINGE | INTRAVENOUS | Status: DC | PRN
  Administered 2017-07-10: 10 mg via INTRAVENOUS

## 2017-07-10 MED ORDER — PROPOFOL 500 MG/50ML IV EMUL
INTRAVENOUS | Status: DC | PRN
  Administered 2017-07-10: 50 ug/kg/min via INTRAVENOUS

## 2017-07-10 MED ORDER — PROPOFOL 10 MG/ML IV BOLUS
INTRAVENOUS | Status: DC | PRN
  Administered 2017-07-10 (×2): 10 mg via INTRAVENOUS
  Administered 2017-07-10: 20 mg via INTRAVENOUS

## 2017-07-10 MED ORDER — BUTAMBEN-TETRACAINE-BENZOCAINE 2-2-14 % EX AERO
INHALATION_SPRAY | CUTANEOUS | Status: DC | PRN
  Administered 2017-07-10: 1 via TOPICAL

## 2017-07-10 NOTE — Progress Notes (Signed)
Tech asked pt when they would like to get washed up and if she wants to go ahead and get it out of the way. Pt said they would like to eat something first and later in the afternoon she will wash up.

## 2017-07-10 NOTE — Transfer of Care (Signed)
Immediate Anesthesia Transfer of Care Note  Patient: [name]  Procedure(s) Performed: TRANSESOPHAGEAL ECHOCARDIOGRAM (TEE) (N/A ) CARDIOVERSION (N/A )  Patient Location: Endoscopy Unit  Anesthesia Type:MAC  Level of Consciousness: drowsy  Airway & Oxygen Therapy: Patient Spontanous Breathing and Patient connected to nasal cannula oxygen  Post-op Assessment: Report given to RN, Post -op Vital signs reviewed and stable and Patient moving all extremities X 4  Post vital signs: Reviewed and stable  Last Vitals:  Vitals:   07/10/17 1015 07/10/17 1020  BP:    Pulse:  95  Resp:  14  Temp:    SpO2: 96% 96%    Last Pain:  Vitals:   07/10/17 1010  TempSrc: Oral  PainSc: 10-Worst pain ever      Patients Stated Pain Goal: 2 (38/93/73 4287)  Complications: No apparent anesthesia complications

## 2017-07-10 NOTE — Progress Notes (Signed)
Patient ID: [name], female   DOB: 01/09/1946, 72 y.o.   MRN: [id number]     Advanced Heart Failure Rounding Note  Primary Cardiologist: Aundra Dubin  Subjective:    Tolerated HD well yesterday.  Fatigue slightly better after HD yesterday. Denies SOB or pain. Remains in Afib, rates 90s.  BP soft but stable.  Objective:   Weight Range: 187 lb 2.7 oz (84.9 kg) Body mass index is 31.15 kg/m.   Vital Signs:   Temp:  [97.7 F (36.5 C)-98.1 F (36.7 C)] 98.1 F (36.7 C) (01/18 0553) Pulse Rate:  [65-105] 65 (01/18 0553) Resp:  [15-19] 19 (01/18 0553) BP: (83-117)/(52-78) 103/54 (01/18 0553) SpO2:  [95 %-97 %] 97 % (01/18 0553) Weight:  [187 lb 2.7 oz (84.9 kg)-187 lb 6.3 oz (85 kg)] 187 lb 2.7 oz (84.9 kg) (01/18 0553) Last BM Date: 07/08/17  Weight change: Filed Weights   07/09/17 0830 07/09/17 1215 07/10/17 0553  Weight: 189 lb 9.5 oz (86 kg) 187 lb 6.3 oz (85 kg) 187 lb 2.7 oz (84.9 kg)   Intake/Output:   Intake/Output Summary (Last 24 hours) at 07/10/2017 1004 Last data filed at 07/10/2017 0647 Gross per 24 hour  Intake 270.66 ml  Output 1200 ml  Net -929.34 ml    Physical Exam   General: Chronically ill and elderly appearing.  HEENT: Normal Neck: Supple. JVP 7-8 cm. Carotids 2+ bilat; no bruits. No thyromegaly or nodule noted. Cor: PMI nondisplaced. Irregularly irregular. No M/G/R noted Lungs: Bibasilar crackles.  Abdomen: Soft, non-tender, non-distended, no HSM. No bruits or masses. +BS  Extremities: No cyanosis, clubbing, or rash. RUE AVF.  Neuro: Alert & orientedx3, cranial nerves grossly intact. moves all 4 extremities w/o difficulty. Affect pleasant   Telemetry   Remains in Afib 80-90s, personally reviewed.   EKG    No new tracings.    Labs    CBC Recent Labs    07/08/17 0653 07/09/17 0741  WBC 11.1* 10.7*  NEUTROABS 9.0*  --   HGB 12.4 11.8*  HCT 38.6 38.0  MCV 98.5 100.0  PLT 193 956   Basic Metabolic Panel Recent Labs     07/07/17 2214  07/09/17 0635 07/10/17 0659  NA 134*   < > 135 135  K 4.5   < > 4.7 4.5  CL 98*   < > 97* 97*  CO2 23   < > 26 27  GLUCOSE 216*   < > 208* 102*  BUN 103*   < > 33* 44*  CREATININE 2.50*   < > 1.82* 2.69*  CALCIUM 9.9   < > 8.4*  8.3* 8.9  PHOS 5.2*  --   --   --    < > = values in this interval not displayed.   Liver Function Tests Recent Labs    07/07/17 2214  ALBUMIN 3.1*   No results for input(s): LIPASE, AMYLASE in the last 72 hours. Cardiac Enzymes No results for input(s): CKTOTAL, CKMB, CKMBINDEX, TROPONINI in the last 72 hours.  BNP: BNP (last 3 results) Recent Labs    06/26/17 0806 07/02/17 1131 07/03/17 0501  BNP 2,226.0* 1,605.1* 2,179.2*    ProBNP (last 3 results) No results for input(s): PROBNP in the last 8760 hours.   D-Dimer No results for input(s): DDIMER in the last 72 hours. Hemoglobin A1C No results for input(s): HGBA1C in the last 72 hours. Fasting Lipid Panel No results for input(s): CHOL, HDL, LDLCALC, TRIG, CHOLHDL, LDLDIRECT in the last 72  hours. Thyroid Function Tests No results for input(s): TSH, T4TOTAL, T3FREE, THYROIDAB in the last 72 hours.  Invalid input(s): FREET3  Other results:   Imaging    No results found.   Medications:     Scheduled Medications: . amiodarone  200 mg Oral Daily  . calcitRIOL  0.25 mcg Oral Daily  . feeding supplement (NEPRO CARB STEADY)  237 mL Oral BID BM  . feeding supplement (PRO-STAT SUGAR FREE 64)  30 mL Oral TID BM  . ferrous sulfate  325 mg Oral BID  . insulin aspart  0-20 Units Subcutaneous TID WC  . insulin aspart  0-5 Units Subcutaneous QHS  . insulin glargine  5 Units Subcutaneous Daily  . isosorbide mononitrate  30 mg Oral Daily  . levothyroxine  50 mcg Oral QAC breakfast  . multivitamin with minerals  1 tablet Oral Daily  . polyethylene glycol  17 g Oral Daily  . pravastatin  80 mg Oral QHS  . sodium chloride flush  3 mL Intravenous Q12H  . sodium chloride  flush  3 mL Intravenous Q12H  . Warfarin - Pharmacist Dosing Inpatient   Does not apply q1800    Infusions: . sodium chloride    . sodium chloride 20 mL/hr at 07/09/17 2315  . sodium chloride      PRN Medications: sodium chloride, acetaminophen **OR** acetaminophen, albuterol, guaiFENesin-dextromethorphan, midodrine, ondansetron **OR** ondansetron (ZOFRAN) IV, oxyCODONE-acetaminophen **AND** oxyCODONE, senna-docusate, sodium chloride flush, sodium chloride flush, zolpidem    Patient Profile   72 yo with history of chronic systolic CHF from nonischemic cardiomyopathy, prior PE, CKD IV-V, and paroxysmal atrial fibrillation presented with dyspnea and progressive renal dysfunction.   Assessment/Plan   1. Acute on chronic systolic CHF: Echo (0/96) with EF 20-25%, nonischemic cardiomyopathy.  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   - Volume status improved with HD yesterday.  - Suspect the majority of her dyspnea is due to CHF, COPD probably plays a lesser role.  - Tolerating HD thus far.  - Coreg stopped 1/14 due to low BP.  2. 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  - Appears to be new this admission on HD, but had AVF in place - Nephrology following.  Had HD 07/09/17 3. Atrial fibrillation: Persistent.  Was in NSR in 9/18 but has been in atrial fibrillation since admitted this time.   - She has been on amiodarone at home.  - Continue amiodarone to 200 mg daily.  - Continue warfarin, INR 2.63 this am.  - Plan for TEE /DC-CV this am.     4. OSA:  - Continue nightly CPAP.  5. COPD:  6. Deconditioning: - PT/OT recommending SNF.   Plan for TEE/DCCV this am.   Length of Stay: [place], [place]  07/10/2017, 10:04 AM  Advanced Heart Failure Team Pager [phone] (M-F; 7a - 4p)  Please contact [facility] for night-coverage after hours (4p -7a ) and weekends on [email or web address]  Patient seen with NP, agree with the  above note.  TEE today showed EF 20% with moderately dilated LV, normal RV size and systolic function, moderate to severe functional MR, no LA appendage thrombus.   Patient underwent successful DCCV back to NSR. Will continue amiodarone and warfarin.   BP soft with HD, no other cardiomyopathy meds at this time.   Loralie Champagne 07/10/2017  11:47 AM

## 2017-07-10 NOTE — Progress Notes (Addendum)
[facility] Progress Note   Assessment/ Plan:    1. 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  Still vol overloaded but gradually improving with HD.  Advanced HF following.  She has had a history of enterococcal endocarditis x 2. On Imdur (Coreg stopped).  2.  Afib: on warfarin and amiodarone.  S.p TEE-DCCV with return to NSR.  3. 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  [facility]VS has been by to eval fistula- duplex shows adequate size of fistula.  Being cannulated successfully.  CLIP in process. Next HD planned for Sat.  Midodrine before HD is helping Bps.  EMLA cream ordered.  Emphasized importance of coming to HD in chair.  4. Anemia: Hgb 12.4, no ESA  5. CKD-MBD: checking PTH, on calcitriol 0.25 mcg TIW.  Phos 5.2, no binders yet  6. Nutrition: Albumin 3.6  7. Hypertension: as above in #1  8: Dispo: pending, for SNF  Subjective:    S/p successful cardioversion today.  For HD tomorrow.     Objective:   BP (!) 102/52   Pulse 71   Temp 98 F (36.7 C) (Oral)   Resp 16   Ht 5\' 5"  (1.651 m)   Wt 84.9 kg (187 lb 2.7 oz)   SpO2 96%   BMI 31.15 kg/m   Physical Exam: Gen: older woman, lying in bed, appears slightly more awake than yesterday HEENT: + JVD CVS: RRR soft systolic murmur Resp: muffled bilaterally esp at the bases, some expiratory rhonchi anteriorly Abd: soft nontender NABS Ext:1+ LE edema ACCESS: RUE AVF + T/B, tortuous Labs: BMET Recent Labs  Lab 07/05/17 0844 07/06/17 0624 07/07/17 0623 07/07/17 2214 07/08/17 0653 07/09/17 0635 07/10/17 0659  NA 128* 130* 132* 134* 137 135 135  K 5.4* 5.0 4.3 4.5 3.8 4.7 4.5  CL 97* 98* 98* 98* 102 97* 97*  CO2 15* 18* 22 23 27 26 27   GLUCOSE 292* 156* 131* 216* 86 208* 102*  BUN 148* 158* 96* 103* 51* 33* 44*  CREATININE 3.53* 3.38* 2.38* 2.50* 1.67* 1.82* 2.69*  CALCIUM 9.5 9.9 9.4 9.9 8.8* 8.4*  8.3* 8.9  PHOS  --   --   --  5.2*  --   --   --     CBC Recent Labs  Lab 07/06/17 1430 07/07/17 2213 07/08/17 0653 07/09/17 0741  WBC 11.6* 12.5* 11.1* 10.7*  NEUTROABS  --   --  9.0*  --   HGB 11.3* 12.5 12.4 11.8*  HCT 34.7* 38.4 38.6 38.0  MCV 95.9 97.0 98.5 100.0  PLT 217 216 193 191    @IMGRELPRIORS @ Medications:    . amiodarone  200 mg Oral Daily  . calcitRIOL  0.25 mcg Oral Daily  . feeding supplement (NEPRO CARB STEADY)  237 mL Oral BID BM  . feeding supplement (PRO-STAT SUGAR FREE 64)  30 mL Oral TID BM  . ferrous sulfate  325 mg Oral BID  . insulin aspart  0-20 Units Subcutaneous TID WC  . insulin aspart  0-5 Units Subcutaneous QHS  . insulin glargine  5 Units Subcutaneous Daily  . isosorbide mononitrate  30 mg Oral Daily  . levothyroxine  50 mcg Oral QAC breakfast  . multivitamin with minerals  1 tablet Oral Daily  . polyethylene glycol  17 g Oral Daily  . pravastatin  80 mg Oral QHS  . sodium chloride flush  3 mL Intravenous Q12H  . sodium chloride flush  3  mL Intravenous Q12H  . warfarin  1 mg Oral ONCE-1800  . Warfarin - Pharmacist Dosing Inpatient   Does not apply [place], MD [facility] pgr [phone] 07/10/2017, 1:55 PM

## 2017-07-10 NOTE — Procedures (Addendum)
Electrical Cardioversion Procedure Note [name] [id number] 06/01/1946  Procedure: Electrical Cardioversion Indications:  Atrial Fibrillation  Procedure Details Consent: Risks of procedure as well as the alternatives and risks of each were explained to the (patient/caregiver).  Consent for procedure obtained. Time Out: Verified patient identification, verified procedure, site/side was marked, verified correct patient position, special equipment/implants available, medications/allergies/relevent history reviewed, required imaging and test results available.  Performed  Patient placed on cardiac monitor, pulse oximetry, supplemental oxygen as necessary.  Sedation given: Propofol per anesthesiology Pacer pads placed anterior and posterior chest.  Cardioverted 1 time(s).  Cardioverted at [place].  Evaluation Findings: Post procedure EKG shows: NSR Complications: None Patient did tolerate procedure well.   Loralie Champagne 07/10/2017, 11:44 AM

## 2017-07-10 NOTE — Anesthesia Preprocedure Evaluation (Addendum)
Anesthesia Evaluation  Patient identified by MRN, date of birth, ID band Patient awake    Reviewed: Allergy & Precautions, NPO status , Patient's Chart, lab work & pertinent test results  Airway Mallampati: II  TM Distance: <3 FB Neck ROM: Full    Dental  (+) Edentulous Upper, Edentulous Lower   Pulmonary shortness of breath, sleep apnea , COPD, former smoker,    + rhonchi        Cardiovascular hypertension, + Peripheral Vascular Disease and +CHF  + dysrhythmias + Cardiac Defibrillator  Rhythm:Irregular Rate:Normal     Neuro/Psych    GI/Hepatic GERD  ,  Endo/Other  diabetesHypothyroidism   Renal/GU Renal disease     Musculoskeletal   Abdominal   Peds  Hematology  (+) anemia ,   Anesthesia Other Findings   Reproductive/Obstetrics                            Anesthesia Physical Anesthesia Plan  ASA: IV  Anesthesia Plan: MAC   Post-op Pain Management:    Induction: Intravenous  PONV Risk Score and Plan: 2 and Treatment may vary due to age or medical condition  Airway Management Planned: Natural Airway and Nasal Cannula  Additional Equipment:   Intra-op Plan:   Post-operative Plan:   Informed Consent: I have reviewed the patients History and Physical, chart, labs and discussed the procedure including the risks, benefits and alternatives for the proposed anesthesia with the patient or authorized representative who has indicated his/her understanding and acceptance.     Plan Discussed with: CRNA  Anesthesia Plan Comments:        Anesthesia Quick Evaluation

## 2017-07-10 NOTE — Progress Notes (Signed)
Received patient post Cardioversion,alert and oriented family at bedside. HR 68 NSR.

## 2017-07-10 NOTE — CV Procedure (Signed)
Procedure: TEE  Sedation: Per anesthesiology  Indication: Atrial fibrillation  Findings: Please see echo section for full report.  Moderately dilated LV with severe diffuse hypokinesis, EF 20%.  Normal right ventricular size and systolic function. Moderate TR with peak RV-RA gradient 33 mmHg.  Moderate left atrial enlargement, no LA appendage thrombus.  Mildly dilated right atrium.  No PFO or ASD by color doppler.  Moderately calcified, trileaflet aortic valve with mild regurgitation and no stenosis.  Moderate to severe mitral regurgitation, appears central and likely functional.  Normal caliber ascending aorta.  No transgastric pictures as I met resistance trying to enter the stomach and did not proceed further.   Impression: No LAA thrombus, may proceed to DCCV.   [name] 07/10/2017 11:44 AM

## 2017-07-10 NOTE — Progress Notes (Signed)
Accepted at [facility] .1st treatment Tuesday.January 22,2019 at 11:15am .Schedule and chairtime Tuesday,Thursday,Saturday at 11:30am .2nd shift

## 2017-07-10 NOTE — Progress Notes (Signed)
PROGRESS NOTE    [name]  UVO:[id number] DOB: April 08, 1946 DOA: 07/02/2017 PCP: Lemmie Evens, MD    Brief Narrative:  72 yo female presents with dyspnea and generalized weakness. Patient is known to have a significant medical history of nonischemic cardiomyopathy EF 20%,atrial fibrillation,type 2 diabetes mellitus,stage IV chronic kidney disease.Complaint of worsening dyspnea for last 4 days,associated with wheezing and decreased functional physical capacity.On initial physical examination blood pressure 101/78, heart rate 73, oxygen saturation 95% on supplemental oxygen, moist mucous membranes, lungs clear to auscultation bilaterally, heart S1-S2 present rhythmic irregularly irregular,the abdomen was soft nontender, no lower extremity edema.Sodium 135, potassium 4.4, chloride 99, bicarbonate 17, glucose 29,BUN 109, creatinine 4.11, white count 7.4, hemoglobin 11.1, hematocrit 35.5,platelets242.BNP 1605,EKG left bundle branch block,atrial fibrillation 95 beats per minute,left axis deviation.  Patient was admitted with working diagnosis of COPD exacerbation, complicated with acute on chronic systolic heart failure.     Assessment & Plan:   Principal Problem:   COPD (chronic obstructive pulmonary disease) (HCC) Active Problems:   Dyslipidemia   HTN (hypertension)   History of pulmonary embolism   Type 2 diabetes, uncontrolled, with renal manifestation (HCC)   Chronic renal disease, stage 4, severely decreased glomerular filtration rate (GFR) between 15-29 mL/min/1.73 square meter (HCC)   Chronic anticoagulation   Atrial fibrillation (HCC)   Implantable cardioverter-defibrillator-CRT- Mdt   Anemia in chronic renal disease   Hypothyroidism   SOB (shortness of breath)   Acute on chronic systolic CHF (congestive heart failure), NYHA class 4 (HCC)   Palliative care by specialist   Goals of care, counseling/discussion   Generalized weakness   Nonischemic  cardiomyopathy ([place])   Hyperglycemia   Dependence on renal dialysis ([place])   Leukocytosis   Acute blood loss anemia  1. Systolicheart failure decompensation. Clinically improve volume, continue HD with ultrafiltration, no dyspnea or chest pain.   2.COPD exacerbation. No signs of exacerbation continue duoneb, oxymetry monitoring,   3.Acute on chronic kidney disease stage IVstarted HD on this admission. Responding well to HD, will need slot as outpatient, follow with nephrology recommendations.  4.Paroxysmal atrial fibrillation. Continue amiodarone, anticoagulation with warfarin.Patient sp cardioversion.   5.Type 2 diabetes mellitus. Continue insulin sliding scale for glucose cover and monitoring, capillary glucose 124, 149, 182, 110, 96.  6.Hypothyroidism. Continue levothyroxine.  DVT prophylaxis:warfarin Code Status:full Family Communication:I spoke with patient's daughter at the bedside and all questions were addressed. Disposition Plan:home or snf   Consultants:  Cardiology  nephrology  Procedures:    Antimicrobials:        Subjective: Patient feeling better, no nausea or vomiting, no chest pain, positive GERD. Tolerated cardioversion well.   Objective: Vitals:   07/10/17 1220 07/10/17 1238 07/10/17 1259 07/10/17 1425  BP: (!) 92/46 (!) 99/52 (!) 102/52 (!) 99/51  Pulse: 68 68 71 75  Resp: 14 16  13   Temp:    98.4 F (36.9 C)  TempSrc:    Oral  SpO2: 96%   97%  Weight:      Height:        Intake/Output Summary (Last 24 hours) at 07/10/2017 1710 Last data filed at 07/10/2017 1142 Gross per 24 hour  Intake 570.66 ml  Output 200 ml  Net 370.66 ml   Filed Weights   07/09/17 0830 07/09/17 1215 07/10/17 0553  Weight: 86 kg (189 lb 9.5 oz) 85 kg (187 lb 6.3 oz) 84.9 kg (187 lb 2.7 oz)    Examination:   General: Not in pain or dyspnea,  deconditioned Neurology: Awake and alert, non focal  E ENT: mild pallor, no icterus, oral  mucosa moist Cardiovascular: No JVD. S1-S2 present, rhythmic, no gallops, rubs, or murmurs. Trace  lower extremity edema. Pulmonary: decreased breath sounds bilaterally at bases, adequate air movement, no wheezing, rhonchi or rales. Gastrointestinal. Abdomen flat, no organomegaly, non tender, no rebound or guarding Skin. No rashes Musculoskeletal: no joint deformities     Data Reviewed: I have personally reviewed following labs and imaging studies  CBC: Recent Labs  Lab 07/05/17 0844 07/06/17 1430 07/07/17 2213 07/08/17 0653 07/09/17 0741  WBC 8.8 11.6* 12.5* 11.1* 10.7*  NEUTROABS  --   --   --  9.0*  --   HGB 11.4* 11.3* 12.5 12.4 11.8*  HCT 35.6* 34.7* 38.4 38.6 38.0  MCV 97.8 95.9 97.0 98.5 100.0  PLT 203 217 216 193 841   Basic Metabolic Panel: Recent Labs  Lab 07/07/17 0623 07/07/17 2214 07/08/17 0653 07/09/17 0635 07/10/17 0659  NA 132* 134* 137 135 135  K 4.3 4.5 3.8 4.7 4.5  CL 98* 98* 102 97* 97*  CO2 22 23 27 26 27   GLUCOSE 131* 216* 86 208* 102*  BUN 96* 103* 51* 33* 44*  CREATININE 2.38* 2.50* 1.67* 1.82* 2.69*  CALCIUM 9.4 9.9 8.8* 8.4*  8.3* 8.9  PHOS  --  5.2*  --   --   --    GFR: Estimated Creatinine Clearance: 20.1 mL/min (A) (by C-G formula based on SCr of 2.69 mg/dL (H)). Liver Function Tests: Recent Labs  Lab 07/06/17 1430 07/07/17 2214  ALT 61*  --   ALBUMIN  --  3.1*   No results for input(s): LIPASE, AMYLASE in the last 168 hours. No results for input(s): AMMONIA in the last 168 hours. Coagulation Profile: Recent Labs  Lab 07/06/17 0624 07/07/17 0623 07/08/17 0653 07/09/17 0635 07/10/17 0659  INR 1.94 1.86 1.86 2.06 2.63   Cardiac Enzymes: No results for input(s): CKTOTAL, CKMB, CKMBINDEX, TROPONINI in the last 168 hours. BNP (last 3 results) No results for input(s): PROBNP in the last 8760 hours. HbA1C: No results for input(s): HGBA1C in the last 72 hours. CBG: Recent Labs  Lab 07/08/17 2250 07/09/17 1231  07/09/17 1652 07/10/17 0746 07/10/17 1244  GLUCAP 124* 149* 182* 110* 96   Lipid Profile: No results for input(s): CHOL, HDL, LDLCALC, TRIG, CHOLHDL, LDLDIRECT in the last 72 hours. Thyroid Function Tests: No results for input(s): TSH, T4TOTAL, FREET4, T3FREE, THYROIDAB in the last 72 hours. Anemia Panel: No results for input(s): VITAMINB12, FOLATE, FERRITIN, TIBC, IRON, RETICCTPCT in the last 72 hours.    Radiology Studies: I have reviewed all of the imaging during this hospital visit personally     Scheduled Meds: . amiodarone  200 mg Oral Daily  . [START ON 07/14/2017] calcitRIOL  0.25 mcg Oral Once per day on Tue Thu Sat  . feeding supplement (NEPRO CARB STEADY)  237 mL Oral BID BM  . feeding supplement (PRO-STAT SUGAR FREE 64)  30 mL Oral TID BM  . insulin aspart  0-20 Units Subcutaneous TID WC  . insulin aspart  0-5 Units Subcutaneous QHS  . insulin glargine  5 Units Subcutaneous Daily  . isosorbide mononitrate  30 mg Oral Daily  . levothyroxine  50 mcg Oral QAC breakfast  . multivitamin with minerals  1 tablet Oral Daily  . polyethylene glycol  17 g Oral Daily  . pravastatin  80 mg Oral QHS  . sodium chloride flush  3  mL Intravenous Q12H  . sodium chloride flush  3 mL Intravenous Q12H  . warfarin  1 mg Oral ONCE-1800  . Warfarin - Pharmacist Dosing Inpatient   Does not apply q1800   Continuous Infusions: . sodium chloride    . sodium chloride       LOS: 8 days        Tawni Millers, MD [facility] Pager [phone]

## 2017-07-10 NOTE — Anesthesia Postprocedure Evaluation (Signed)
Anesthesia Post Note  Patient: [name]  Procedure(s) Performed: TRANSESOPHAGEAL ECHOCARDIOGRAM (TEE) (N/A ) CARDIOVERSION (N/A )     Patient location during evaluation: Endoscopy Anesthesia Type: MAC Level of consciousness: awake and alert Pain management: pain level controlled Vital Signs Assessment: post-procedure vital signs reviewed and stable Respiratory status: spontaneous breathing, nonlabored ventilation, respiratory function stable and patient connected to nasal cannula oxygen Cardiovascular status: stable and blood pressure returned to baseline Postop Assessment: no apparent nausea or vomiting Anesthetic complications: no    Last Vitals:  Vitals:   07/10/17 1259 07/10/17 1425  BP: (!) 102/52 (!) 99/51  Pulse: 71 75  Resp:  13  Temp:  36.9 C  SpO2:  97%    Last Pain:  Vitals:   07/10/17 1425  TempSrc: Oral  PainSc: 10-Worst pain ever                 Tamar Miano,JAMES TERRILL

## 2017-07-10 NOTE — Interval H&P Note (Signed)
History and Physical Interval Note:  07/10/2017 11:29 AM  [name]  has presented today for surgery, with the diagnosis of AFIB  The various methods of treatment have been discussed with the patient and family. After consideration of risks, benefits and other options for treatment, the patient has consented to  Procedure(s): TRANSESOPHAGEAL ECHOCARDIOGRAM (TEE) (N/A) CARDIOVERSION (N/A) as a surgical intervention .  The patient's history has been reviewed, patient examined, no change in status, stable for surgery.  I have reviewed the patient's chart and labs.  Questions were answered to the patient's satisfaction.     Caragh Gasper [employer]

## 2017-07-10 NOTE — Progress Notes (Signed)
  Echocardiogram Echocardiogram Transesophageal has been performed.  [name] 07/10/2017, 12:11 PM

## 2017-07-10 NOTE — Progress Notes (Signed)
Pt resting during night with no signs distress to report.  NPO for TEE.  EKG performed.

## 2017-07-10 NOTE — Progress Notes (Signed)
ANTICOAGULATION CONSULT NOTE - Follow Up Consult  Pharmacy Consult for warfarin Indication: atrial fibrillation and VTE prophylaxis  No Known Allergies  Patient Measurements: Height: 5\' 5"  (165.1 cm) Weight: 187 lb 2.7 oz (84.9 kg) IBW/kg (Calculated) : 57   Vital Signs: Temp: 98 F (36.7 C) (01/18 1151) Temp Source: Oral (01/18 1151) BP: 102/52 (01/18 1259) Pulse Rate: 71 (01/18 1259)  Labs: Recent Labs    07/07/17 2213  07/08/17 0653 07/09/17 0635 07/09/17 0741 07/10/17 0659  HGB 12.5  --  12.4  --  11.8*  --   HCT 38.4  --  38.6  --  38.0  --   PLT 216  --  193  --  191  --   LABPROT  --   --  21.3* 23.0*  --  27.9*  INR  --   --  1.86 2.06  --  2.63  CREATININE  --    < > 1.67* 1.82*  --  2.69*   < > = values in this interval not displayed.    Estimated Creatinine Clearance: 20.1 mL/min (A) (by C-G formula based on SCr of 2.69 mg/dL (H)).  Medications:  Scheduled:  . amiodarone  200 mg Oral Daily  . calcitRIOL  0.25 mcg Oral Daily  . feeding supplement (NEPRO CARB STEADY)  237 mL Oral BID BM  . feeding supplement (PRO-STAT SUGAR FREE 64)  30 mL Oral TID BM  . ferrous sulfate  325 mg Oral BID  . insulin aspart  0-20 Units Subcutaneous TID WC  . insulin aspart  0-5 Units Subcutaneous QHS  . insulin glargine  5 Units Subcutaneous Daily  . isosorbide mononitrate  30 mg Oral Daily  . levothyroxine  50 mcg Oral QAC breakfast  . multivitamin with minerals  1 tablet Oral Daily  . polyethylene glycol  17 g Oral Daily  . pravastatin  80 mg Oral QHS  . sodium chloride flush  3 mL Intravenous Q12H  . sodium chloride flush  3 mL Intravenous Q12H  . warfarin  1 mg Oral ONCE-1800  . Warfarin - Pharmacist Dosing Inpatient   Does not apply q1800    Assessment: 72 y.o female presented with SOB and recent admit on 1/4 for CHF exacerbation.  On warfarin for afib prior to admission with therapeutic INR 3 upon admission.   Home regimen: 1.25 mg on MWF and 2.5 mg all other  days (weekly dose 13.75mg ).  INR trended down from 3.1 to 2.02 after holding dose 1/11.  INR trended down further 1.86 > 2.6 after 3 boost doses.     s/p TEE/DCCV Afib > SR 1/18 will keep INR > 2. Amiodarone dose slightly higher than home dose.  No overt bleeding or complications noted.  Awaiting SNF placement  Goal of Therapy:  INR 2-3 Monitor platelets by anticoagulation protocol: Yes   Plan:  Warfarin 1 mg x1 tonight - then can resume home dose 1.25mg  TTSS / 2.5mg  MWF Monitor daily INR, CBC, signs/symptoms of bleeding   Bonnita Nasuti Pharm.D. CPP, BCPS Clinical Pharmacist [phone] 07/10/2017 1:45 PM

## 2017-07-11 LAB — RENAL FUNCTION PANEL
ALBUMIN: 2.6 g/dL — AB (ref 3.5–5.0)
Anion gap: 11 (ref 5–15)
BUN: 53 mg/dL — ABNORMAL HIGH (ref 6–20)
CALCIUM: 8.4 mg/dL — AB (ref 8.9–10.3)
CHLORIDE: 94 mmol/L — AB (ref 101–111)
CO2: 24 mmol/L (ref 22–32)
Creatinine, Ser: 3.42 mg/dL — ABNORMAL HIGH (ref 0.44–1.00)
GFR calc Af Amer: 14 mL/min — ABNORMAL LOW (ref >60)
GFR, EST NON AFRICAN AMERICAN: 12 mL/min — AB (ref >60)
Glucose, Bld: 247 mg/dL — ABNORMAL HIGH (ref 65–99)
Phosphorus: 3.5 mg/dL (ref 2.5–4.6)
Potassium: 4.3 mmol/L (ref 3.5–5.1)
SODIUM: 129 mmol/L — AB (ref 135–145)

## 2017-07-11 LAB — CBC
HCT: 32.3 % — ABNORMAL LOW (ref 36.0–46.0)
Hemoglobin: 10.3 g/dL — ABNORMAL LOW (ref 12.0–15.0)
MCH: 31.7 pg (ref 26.0–34.0)
MCHC: 31.9 g/dL (ref 30.0–36.0)
MCV: 99.4 fL (ref 78.0–100.0)
Platelets: 165 10*3/uL (ref 150–400)
RBC: 3.25 MIL/uL — ABNORMAL LOW (ref 3.87–5.11)
RDW: 16.3 % — AB (ref 11.5–15.5)
WBC: 2.7 10*3/uL — AB (ref 4.0–10.5)

## 2017-07-11 LAB — PROTIME-INR
INR: 2.85
Prothrombin Time: 29.7 seconds — ABNORMAL HIGH (ref 11.4–15.2)

## 2017-07-11 LAB — GLUCOSE, CAPILLARY
GLUCOSE-CAPILLARY: 87 mg/dL (ref 65–99)
GLUCOSE-CAPILLARY: 172 mg/dL — AB (ref 65–99)
GLUCOSE-CAPILLARY: 192 mg/dL — AB (ref 65–99)

## 2017-07-11 MED ORDER — WARFARIN 1.25 MG HALF TABLET
1.2500 mg | ORAL_TABLET | Freq: Once | ORAL | Status: DC
  Filled 2017-07-11: qty 1

## 2017-07-11 MED ORDER — MIDODRINE HCL 5 MG PO TABS
ORAL_TABLET | ORAL | Status: AC
  Filled 2017-07-11: qty 1

## 2017-07-11 MED ORDER — WARFARIN SODIUM 2.5 MG PO TABS
2.5000 mg | ORAL_TABLET | Freq: Once | ORAL | Status: AC
  Administered 2017-07-11: 2.5 mg via ORAL
  Filled 2017-07-11 (×2): qty 1

## 2017-07-11 NOTE — Progress Notes (Signed)
[facility] Progress Note   Assessment/ Plan:    1. 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  Still vol overloaded but gradually improving with HD.  Advanced HF following.  She has had a history of enterococcal endocarditis necessitating removal of defib in 2016 and hasn't been replaced. On Imdur (Coreg stopped).  2.  Afib: on warfarin and amiodarone.  S.p TEE-DCCV 1/18 return to NSR.  3. 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  [facility] has been by to eval fistula- duplex shows adequate size of fistula.  Being cannulated successfully.  CLIP in process. Next HD today 1/19.  Midodrine before HD is helping Bps.  EMLA cream ordered.  Emphasized importance of coming to HD in chair.  4. Anemia: Hgb 12.4, no ESA  5. CKD-MBD: checking PTH, on calcitriol 0.25 mcg TIW.  Phos 5.2, no binders yet  6. Nutrition: Albumin 3.6  7. Hypertension: as above in #1  8: Dispo: pending, for SNF  Subjective:    S/p successful cardioversion today.  For HD tomorrow.     Objective:   BP (!) 102/53 (BP Location: Left Arm)   Pulse 65   Temp 97.7 F (36.5 C) (Oral)   Resp 15   Ht 5\' 5"  (1.651 m)   Wt 87.9 kg (193 lb 12.6 oz)   SpO2 98%   BMI 32.25 kg/m   Physical Exam: Gen: older woman, lying in bed, appears slightly more awake than yesterday HEENT: + JVD CVS: RRR soft systolic murmur Resp: muffled bilaterally esp at the bases, some expiratory rhonchi anteriorly Abd: soft nontender NABS Ext:1+ LE edema ACCESS: RUE AVF + T/B, tortuous Labs: BMET Recent Labs  Lab 07/05/17 0844 07/06/17 0624 07/07/17 0623 07/07/17 2214 07/08/17 0653 07/09/17 0635 07/10/17 0659  NA 128* 130* 132* 134* 137 135 135  K 5.4* 5.0 4.3 4.5 3.8 4.7 4.5  CL 97* 98* 98* 98* 102 97* 97*  CO2 15* 18* 22 23 27 26 27   GLUCOSE 292* 156* 131* 216* 86 208* 102*  BUN 148* 158* 96* 103* 51* 33* 44*  CREATININE 3.53* 3.38* 2.38* 2.50* 1.67* 1.82* 2.69*  CALCIUM 9.5  9.9 9.4 9.9 8.8* 8.4*  8.3* 8.9  PHOS  --   --   --  5.2*  --   --   --    CBC Recent Labs  Lab 07/06/17 1430 07/07/17 2213 07/08/17 0653 07/09/17 0741  WBC 11.6* 12.5* 11.1* 10.7*  NEUTROABS  --   --  9.0*  --   HGB 11.3* 12.5 12.4 11.8*  HCT 34.7* 38.4 38.6 38.0  MCV 95.9 97.0 98.5 100.0  PLT 217 216 193 191    @IMGRELPRIORS @ Medications:    . amiodarone  200 mg Oral Daily  . [START ON 07/14/2017] calcitRIOL  0.25 mcg Oral Once per day on Tue Thu Sat  . feeding supplement (NEPRO CARB STEADY)  237 mL Oral BID BM  . feeding supplement (PRO-STAT SUGAR FREE 64)  30 mL Oral TID BM  . insulin aspart  0-20 Units Subcutaneous TID WC  . insulin aspart  0-5 Units Subcutaneous QHS  . insulin glargine  5 Units Subcutaneous Daily  . isosorbide mononitrate  30 mg Oral Daily  . levothyroxine  50 mcg Oral QAC breakfast  . multivitamin with minerals  1 tablet Oral Daily  . polyethylene glycol  17 g Oral Daily  . pravastatin  80 mg Oral QHS  . sodium chloride flush  3 mL Intravenous Q12H  . sodium chloride flush  3 mL Intravenous Q12H  . Warfarin - Pharmacist Dosing Inpatient   Does not apply [place], MD [place] pgr [phone] 07/11/2017, 12:04 PM

## 2017-07-11 NOTE — Progress Notes (Addendum)
ANTICOAGULATION CONSULT NOTE - Follow Up Consult  Pharmacy Consult for warfarin Indication: atrial fibrillation and VTE prophylaxis  No Known Allergies  Patient Measurements: Height: 5\' 5"  (165.1 cm) Weight: 193 lb 12.6 oz (87.9 kg) IBW/kg (Calculated) : 57   Vital Signs: Temp: 98.6 F (37 C) (01/19 1208) Temp Source: Oral (01/19 1208) BP: 111/56 (01/19 1208) Pulse Rate: 71 (01/19 1208)  Labs: Recent Labs    07/09/17 0635 07/09/17 0741 07/10/17 0659 07/11/17 0845  HGB  --  11.8*  --   --   HCT  --  38.0  --   --   PLT  --  191  --   --   LABPROT 23.0*  --  27.9* 29.7*  INR 2.06  --  2.63 2.85  CREATININE 1.82*  --  2.69*  --     Estimated Creatinine Clearance: 20.4 mL/min (A) (by C-G formula based on SCr of 2.69 mg/dL (H)).  Medications:  Scheduled:  . amiodarone  200 mg Oral Daily  . [START ON 07/14/2017] calcitRIOL  0.25 mcg Oral Once per day on Tue Thu Sat  . feeding supplement (NEPRO CARB STEADY)  237 mL Oral BID BM  . feeding supplement (PRO-STAT SUGAR FREE 64)  30 mL Oral TID BM  . insulin aspart  0-20 Units Subcutaneous TID WC  . insulin aspart  0-5 Units Subcutaneous QHS  . insulin glargine  5 Units Subcutaneous Daily  . isosorbide mononitrate  30 mg Oral Daily  . levothyroxine  50 mcg Oral QAC breakfast  . multivitamin with minerals  1 tablet Oral Daily  . polyethylene glycol  17 g Oral Daily  . pravastatin  80 mg Oral QHS  . sodium chloride flush  3 mL Intravenous Q12H  . sodium chloride flush  3 mL Intravenous Q12H  . Warfarin - Pharmacist Dosing Inpatient   Does not apply q1800    Assessment: 72 y.o female presented with SOB and recent admit on 1/4 for CHF exacerbation.  On warfarin for afib prior to admission with therapeutic INR 3 upon admission.   Home regimen: 1.25 mg on MWF and 2.5 mg all other days (weekly dose 13.75mg ).  INR trended down from 3.1 to 2.02 after holding dose 1/11.  INR trended down further after 3 boost doses.     s/p  TEE/DCCV Afib > SR 1/18 - will keep INR > 2. Amiodarone dose slightly higher than home dose.  No overt bleeding or complications noted. CBC stable.  Goal of Therapy:  INR 2-3 Monitor platelets by anticoagulation protocol: Yes   Plan:  Warfarin 2.5mg  PO x 1 tonight Monitor daily INR, CBC, signs/symptoms of bleeding   Elicia Lamp, PharmD, BCPS Clinical Pharmacist Clinical phone for 07/11/2017 until 3:30pm: [phone] If after 3:30pm, please call main pharmacy at: [phone] 07/11/2017 12:19 PM

## 2017-07-11 NOTE — Progress Notes (Signed)
PROGRESS NOTE    [name]  QJJ:[id number] DOB: [birth year] DOA: 07/02/2017 PCP: Lemmie Evens, MD    Brief Narrative:  72 yo female presents with dyspnea and generalized weakness. Patient is known to have a significant medical history of nonischemic cardiomyopathy EF 20%,atrial fibrillation,type 2 diabetes mellitus,stage IV chronic kidney disease.Complaint of worsening dyspnea for last 4 days,associated with wheezing and decreased functional physical capacity.On initial physical examination blood pressure 101/78, heart rate 73, oxygen saturation 95% on supplemental oxygen, moist mucous membranes, lungs clear to auscultation bilaterally, heart S1-S2 present rhythmic irregularly irregular,the abdomen was soft nontender, no lower extremity edema.Sodium 135, potassium 4.4, chloride 99, bicarbonate 17, glucose 29,BUN 109, creatinine 4.11, white count 7.4, hemoglobin 11.1, hematocrit 35.5,platelets242.BNP 1605,EKG left bundle branch block,atrial fibrillation 95 beats per minute,left axis deviation.  Patient was admitted with working diagnosis of COPD exacerbation, complicated with acute on chronic systolic heart failure.   Assessment & Plan:   Principal Problem:   COPD (chronic obstructive pulmonary disease) (HCC) Active Problems:   Dyslipidemia   HTN (hypertension)   History of pulmonary embolism   Type 2 diabetes, uncontrolled, with renal manifestation (HCC)   Chronic renal disease, stage 4, severely decreased glomerular filtration rate (GFR) between 15-29 mL/min/1.73 square meter (HCC)   Chronic anticoagulation   Atrial fibrillation (HCC)   Implantable cardioverter-defibrillator-CRT- Mdt   Anemia in chronic renal disease   Hypothyroidism   SOB (shortness of breath)   Acute on chronic systolic CHF (congestive heart failure), NYHA class 4 (HCC)   Palliative care by specialist   Goals of care, counseling/discussion   Generalized weakness   Nonischemic cardiomyopathy  ([place])   Hyperglycemia   Dependence on renal dialysis ([place])   Leukocytosis   Acute blood loss anemia  1. Systolicheart failure decompensation.patient for HD +UF today, for further volume management, will continue blood pressure monitoring. Continue with isosorbide, will continue telemetry monitoring.   2.COPD.No current signs clinical exacerbation bronchodilator therapy withduoneb, continue oxymetry monitoring, and supplemental 02 per [place].   3.Acute on chronic kidney disease stage IVstarted HD on this admission.For HD today, volume is improved, will follow with nephrology recommendations, likely will need long term HD as outpatient. Continue calcitriol.   4.Paroxysmal atrial fibrillation.On amiodarone, continue anticoagulation with warfarin.INR at 2,85. SP electrical cardioversion.    5.Type 2 diabetes mellitus.On insulin sliding scale for glucose cover and monitoring, capillary glucose 96, 153, 87, 192, 172. Basal insulin with 5 units daily.   6.Hypothyroidism.Onlevothyroxine.  DVT prophylaxis:warfarin Code Status:full Family Communication:I spoke with patient's daughter at the bedside and all questions were addressed. Disposition Plan:home or snf   Consultants:  Cardiology  nephrology  Procedures:    Antimicrobials  Subjective: Patient feeling better, no nausea or vomiting, improved burning chest pain, no palpitations, no dyspnea.   Objective: Vitals:   07/10/17 1259 07/10/17 1425 07/10/17 2100 07/11/17 0555  BP: (!) 102/52 (!) 99/51 (!) 104/46 (!) 102/53  Pulse: 71 75 70 65  Resp:  13 16 15   Temp:  98.4 F (36.9 C) 97.9 F (36.6 C) 97.7 F (36.5 C)  TempSrc:  Oral Oral Oral  SpO2:  97% 100% 98%  Weight:    87.9 kg (193 lb 12.6 oz)  Height:        Intake/Output Summary (Last 24 hours) at 07/11/2017 1028 Last data filed at 07/11/2017 0921 Gross per 24 hour  Intake 420 ml  Output 100 ml  Net 320 ml   Filed Weights    07/09/17 1215 07/10/17 0553  07/11/17 0555  Weight: 85 kg (187 lb 6.3 oz) 84.9 kg (187 lb 2.7 oz) 87.9 kg (193 lb 12.6 oz)    Examination:   General: Not in pain or dyspnea, deconditioned Neurology: Awake and alert, non focal  E ENT: mild pallor, no icterus, oral mucosa moist Cardiovascular: No JVD. S1-S2 present, rhythmic, no gallops, rubs, or murmurs. Trace lower extremity edema. Pulmonary: vesicular breath sounds bilaterally, adequate air movement, no wheezing, rhonchi or rales. Gastrointestinal. Abdomen protuberant, no organomegaly, non tender, no rebound or guarding Skin. No rashes Musculoskeletal: no joint deformities     Data Reviewed: I have personally reviewed following labs and imaging studies  CBC: Recent Labs  Lab 07/05/17 0844 07/06/17 1430 07/07/17 2213 07/08/17 0653 07/09/17 0741  WBC 8.8 11.6* 12.5* 11.1* 10.7*  NEUTROABS  --   --   --  9.0*  --   HGB 11.4* 11.3* 12.5 12.4 11.8*  HCT 35.6* 34.7* 38.4 38.6 38.0  MCV 97.8 95.9 97.0 98.5 100.0  PLT 203 217 216 193 563   Basic Metabolic Panel: Recent Labs  Lab 07/07/17 0623 07/07/17 2214 07/08/17 0653 07/09/17 0635 07/10/17 0659  NA 132* 134* 137 135 135  K 4.3 4.5 3.8 4.7 4.5  CL 98* 98* 102 97* 97*  CO2 22 23 27 26 27   GLUCOSE 131* 216* 86 208* 102*  BUN 96* 103* 51* 33* 44*  CREATININE 2.38* 2.50* 1.67* 1.82* 2.69*  CALCIUM 9.4 9.9 8.8* 8.4*  8.3* 8.9  PHOS  --  5.2*  --   --   --    GFR: Estimated Creatinine Clearance: 20.4 mL/min (A) (by C-G formula based on SCr of 2.69 mg/dL (H)). Liver Function Tests: Recent Labs  Lab 07/06/17 1430 07/07/17 2214  ALT 61*  --   ALBUMIN  --  3.1*   No results for input(s): LIPASE, AMYLASE in the last 168 hours. No results for input(s): AMMONIA in the last 168 hours. Coagulation Profile: Recent Labs  Lab 07/06/17 0624 07/07/17 0623 07/08/17 0653 07/09/17 0635 07/10/17 0659  INR 1.94 1.86 1.86 2.06 2.63   Cardiac Enzymes: No results for  input(s): CKTOTAL, CKMB, CKMBINDEX, TROPONINI in the last 168 hours. BNP (last 3 results) No results for input(s): PROBNP in the last 8760 hours. HbA1C: No results for input(s): HGBA1C in the last 72 hours. CBG: Recent Labs  Lab 07/09/17 1652 07/10/17 0746 07/10/17 1244 07/10/17 2101 07/11/17 0746  GLUCAP 182* 110* 96 153* 87   Lipid Profile: No results for input(s): CHOL, HDL, LDLCALC, TRIG, CHOLHDL, LDLDIRECT in the last 72 hours. Thyroid Function Tests: No results for input(s): TSH, T4TOTAL, FREET4, T3FREE, THYROIDAB in the last 72 hours. Anemia Panel: No results for input(s): VITAMINB12, FOLATE, FERRITIN, TIBC, IRON, RETICCTPCT in the last 72 hours.    Radiology Studies: I have reviewed all of the imaging during this hospital visit personally     Scheduled Meds: . amiodarone  200 mg Oral Daily  . [START ON 07/14/2017] calcitRIOL  0.25 mcg Oral Once per day on Tue Thu Sat  . feeding supplement (NEPRO CARB STEADY)  237 mL Oral BID BM  . feeding supplement (PRO-STAT SUGAR FREE 64)  30 mL Oral TID BM  . insulin aspart  0-20 Units Subcutaneous TID WC  . insulin aspart  0-5 Units Subcutaneous QHS  . insulin glargine  5 Units Subcutaneous Daily  . isosorbide mononitrate  30 mg Oral Daily  . levothyroxine  50 mcg Oral QAC breakfast  . multivitamin with  minerals  1 tablet Oral Daily  . polyethylene glycol  17 g Oral Daily  . pravastatin  80 mg Oral QHS  . sodium chloride flush  3 mL Intravenous Q12H  . sodium chloride flush  3 mL Intravenous Q12H  . Warfarin - Pharmacist Dosing Inpatient   Does not apply q1800   Continuous Infusions: . sodium chloride    . sodium chloride       LOS: 9 days        Tawni Millers, MD [facility] Pager [phone]

## 2017-07-11 NOTE — Procedures (Signed)
Patient seen and examined on Hemodialysis. QB 400 mL/ min via RUE AVF UF goal 2.5L  Sitting in chair and tolerating it well- "not as bad" as she thought.  Treatment adjusted as needed.  Madelon Lips MD [facility] pgr [phone] 4:56 PM

## 2017-07-11 NOTE — Progress Notes (Signed)
Progress Note   Subjective   Doing well today, the patient denies CP or SOB. "Im just tired"  No new concerns  Inpatient Medications    Scheduled Meds: . amiodarone  200 mg Oral Daily  . [START ON 07/14/2017] calcitRIOL  0.25 mcg Oral Once per day on Tue Thu Sat  . feeding supplement (NEPRO CARB STEADY)  237 mL Oral BID BM  . feeding supplement (PRO-STAT SUGAR FREE 64)  30 mL Oral TID BM  . insulin aspart  0-20 Units Subcutaneous TID WC  . insulin aspart  0-5 Units Subcutaneous QHS  . insulin glargine  5 Units Subcutaneous Daily  . isosorbide mononitrate  30 mg Oral Daily  . levothyroxine  50 mcg Oral QAC breakfast  . midodrine      . multivitamin with minerals  1 tablet Oral Daily  . polyethylene glycol  17 g Oral Daily  . pravastatin  80 mg Oral QHS  . sodium chloride flush  3 mL Intravenous Q12H  . sodium chloride flush  3 mL Intravenous Q12H  . warfarin  2.5 mg Oral ONCE-1800  . Warfarin - Pharmacist Dosing Inpatient   Does not apply q1800   Continuous Infusions: . sodium chloride    . sodium chloride     PRN Meds: sodium chloride, acetaminophen **OR** acetaminophen, albuterol, guaiFENesin-dextromethorphan, lidocaine-prilocaine, midodrine, ondansetron **OR** ondansetron (ZOFRAN) IV, oxyCODONE-acetaminophen **AND** oxyCODONE, senna-docusate, sodium chloride flush, sodium chloride flush, zolpidem   Vital Signs    Vitals:   07/11/17 1208 07/11/17 1321 07/11/17 1338 07/11/17 1400  BP: (!) 111/56 (!) 108/53 (!) 104/53 (!) 102/51  Pulse: 71 79 71 71  Resp: 20 15    Temp: 98.6 F (37 C) 98 F (36.7 C)    TempSrc: Oral Oral    SpO2: 96% 96%    Weight:      Height:        Intake/Output Summary (Last 24 hours) at 07/11/2017 1434 Last data filed at 07/11/2017 1343 Gross per 24 hour  Intake 360 ml  Output 100 ml  Net 260 ml   Filed Weights   07/09/17 1215 07/10/17 0553 07/11/17 0555  Weight: 187 lb 6.3 oz (85 kg) 187 lb 2.7 oz (84.9 kg) 193 lb 12.6 oz (87.9 kg)      Telemetry    Sinus rhythm - Personally Reviewed  Physical Exam   GEN- The patient is chronically ill appearing, alert and oriented x 3 today.  Currently receiving dialysis Head- normocephalic, atraumatic Eyes-  Sclera clear, conjunctiva pink Ears- hearing intact Oropharynx- clear Neck- supple, + JVD Lungs- decreased BS at bases, normal work of breathing Heart- Regular rate and rhythm  GI- soft, NT, ND, + BS Extremities- no clubbing, cyanosis, + dependant edema  MS-diffuse atrophy   Labs    Chemistry Recent Labs  Lab 07/06/17 1430  07/07/17 2214  07/09/17 0635 07/10/17 0659 07/11/17 1358  NA  --    < > 134*   < > 135 135 129*  K  --    < > 4.5   < > 4.7 4.5 4.3  CL  --    < > 98*   < > 97* 97* 94*  CO2  --    < > 23   < > 26 27 24   GLUCOSE  --    < > 216*   < > 208* 102* 247*  BUN  --    < > 103*   < > 33* 44* 53*  CREATININE  --    < > 2.50*   < > 1.82* 2.69* 3.42*  CALCIUM  --    < > 9.9   < > 8.4*  8.3* 8.9 8.4*  ALBUMIN  --   --  3.1*  --   --   --  2.6*  ALT 61*  --   --   --   --   --   --   GFRNONAA  --    < > 18*   < > 26* 16* 12*  GFRAA  --    < > 21*   < > 31* 19* 14*  ANIONGAP  --    < > 13   < > 12 11 11    < > = values in this interval not displayed.     Hematology Recent Labs  Lab 07/08/17 0653 07/09/17 0741 07/11/17 1357  WBC 11.1* 10.7* 2.7*  RBC 3.92 3.80* 3.25*  HGB 12.4 11.8* 10.3*  HCT 38.6 38.0 32.3*  MCV 98.5 100.0 99.4  MCH 31.6 31.1 31.7  MCHC 32.1 31.1 31.9  RDW 16.9* 17.0* 16.3*  PLT 193 191 165    Cardiac EnzymesNo results for input(s): TROPONINI in the last 168 hours. No results for input(s): TROPIPOC in the last 168 hours.      Assessment & Plan    1.  Persistent atrial fibrillation Maintaining sinus rhythm with cardioversion Continue coumadin Continue amiodarone  2. Acute on chronic systolic CHF Continue dialysis for fluid removal Complicated by renal failure Not a candidate for EP devices (prior extraction  for enterococcal endocarditis)  3. Stage V renal failure Appreciate nephrology input  4. COPD Stable No change required today  Hopefully will continue to improve with diuresis and sinus rhythm  Thompson Grayer MD, [facility] 07/11/2017 2:34 PM

## 2017-07-12 LAB — BASIC METABOLIC PANEL
Anion gap: 12 (ref 5–15)
BUN: 38 mg/dL — AB (ref 6–20)
CALCIUM: 8.2 mg/dL — AB (ref 8.9–10.3)
CO2: 25 mmol/L (ref 22–32)
CREATININE: 2.91 mg/dL — AB (ref 0.44–1.00)
Chloride: 96 mmol/L — ABNORMAL LOW (ref 101–111)
GFR calc Af Amer: 17 mL/min — ABNORMAL LOW (ref >60)
GFR calc non Af Amer: 15 mL/min — ABNORMAL LOW (ref >60)
GLUCOSE: 123 mg/dL — AB (ref 65–99)
Potassium: 4.1 mmol/L (ref 3.5–5.1)
Sodium: 133 mmol/L — ABNORMAL LOW (ref 135–145)

## 2017-07-12 LAB — GLUCOSE, CAPILLARY
GLUCOSE-CAPILLARY: 108 mg/dL — AB (ref 65–99)
GLUCOSE-CAPILLARY: 223 mg/dL — AB (ref 65–99)
Glucose-Capillary: 174 mg/dL — ABNORMAL HIGH (ref 65–99)
Glucose-Capillary: 163 mg/dL — ABNORMAL HIGH (ref 65–99)
Glucose-Capillary: 121 mg/dL — ABNORMAL HIGH (ref 65–99)
Glucose-Capillary: 203 mg/dL — ABNORMAL HIGH (ref 65–99)
Glucose-Capillary: 192 mg/dL — ABNORMAL HIGH (ref 65–99)
Glucose-Capillary: 123 mg/dL — ABNORMAL HIGH (ref 65–99)

## 2017-07-12 LAB — PROTIME-INR
INR: 2.27
PROTHROMBIN TIME: 24.9 s — AB (ref 11.4–15.2)

## 2017-07-12 MED ORDER — WARFARIN SODIUM 2.5 MG PO TABS
2.5000 mg | ORAL_TABLET | Freq: Once | ORAL | Status: AC
  Administered 2017-07-12: 2.5 mg via ORAL
  Filled 2017-07-12: qty 1

## 2017-07-12 NOTE — Progress Notes (Signed)
PROGRESS NOTE    MISHEEL [name]  JJO:[id number] DOB: Jun 02, 1946 DOA: 07/02/2017 PCP: Lemmie Evens, MD    Brief Narrative:  72 yo female presents with dyspnea and generalized weakness. Patient is known to have a significant medical history of nonischemic cardiomyopathy EF 20%,atrial fibrillation,type 2 diabetes mellitus,stage IV chronic kidney disease.Complaint of worsening dyspnea for last 4 days,associated with wheezing and decreased functional physical capacity.On initial physical examination blood pressure 101/78, heart rate 73, oxygen saturation 95% on supplemental oxygen, moist mucous membranes, lungs clear to auscultation bilaterally, heart S1-S2 present rhythmic irregularly irregular,the abdomen was soft nontender, no lower extremity edema.Sodium 135, potassium 4.4, chloride 99, bicarbonate 17, glucose 29,BUN 109, creatinine 4.11, white count 7.4, hemoglobin 11.1, hematocrit 35.5,platelets242.BNP 1605,EKG left bundle branch block,atrial fibrillation 95 beats per minute,left axis deviation.  Patient was admitted with working diagnosis of COPD exacerbation, complicated with acute on chronic systolic heart failure.   Assessment & Plan:   Principal Problem:   COPD (chronic obstructive pulmonary disease) (HCC) Active Problems:   Dyslipidemia   HTN (hypertension)   History of pulmonary embolism   Type 2 diabetes, uncontrolled, with renal manifestation (HCC)   Chronic renal disease, stage 4, severely decreased glomerular filtration rate (GFR) between 15-29 mL/min/1.73 square meter (HCC)   Chronic anticoagulation   Atrial fibrillation (HCC)   Implantable cardioverter-defibrillator-CRT- Mdt   Anemia in chronic renal disease   Hypothyroidism   SOB (shortness of breath)   Acute on chronic systolic CHF (congestive heart failure), NYHA class 4 (HCC)   Palliative care by specialist   Goals of care, counseling/discussion   Generalized weakness   Nonischemic cardiomyopathy  ([name])   Hyperglycemia   Dependence on renal dialysis ([name])   Leukocytosis   Acute blood loss anemia   1. Systolicheart failure decompensation.On HD +UF, euvolemic. Patient has remained on sinus rhythm. Continue with isosorbide.    2.COPD.No signs of exacerbation, continue bronchodilator therapy withduoneb, continue oxymetry monitoring. Today at 100% on room air.    3.Acute on chronic kidney disease stage IVstarted HD on this admission.Tolerating well HD, will continue to follow nephrology recommendations, on calcitriol. As needed midodrine on HD for hypotension.   4.Paroxysmal atrial fibrillation/ sp electrical cardioversion.Tolerating Well amiodarone, patient has remained on sinus rhythm, personally reviewed telemetry monitor. Continue anticoagulation with warfarin.INR at 2,27.  5.Type 2 diabetes mellitus.Continue insulin sliding scale for glucose cover and monitoring, capillary glucose 87, 192, 172, 203, 108. plus basal insulin with 5 units daily. Patient tolerating well po, improved gerd symptoms with antiacids.   6.Hypothyroidism.Onlevothyroxine with good toleration.   7. Dyslipidemia. Continue pravastatin.   DVT prophylaxis:warfarin Code Status:full Family Communication:I spoke with patient's family at the bedside and all questions were addressed. Disposition Plan:home or snf   Consultants:  Cardiology  nephrology  Procedures:    Antimicrobials    Subjective: Patient feeling better, no palpitations or chest pain, dyspnea continue to improve, burning chest pain has improved, no nausea or vomiting.   Objective: Vitals:   07/11/17 1738 07/11/17 1929 07/11/17 2100 07/12/17 0627  BP: (!) 97/49  (!) 93/53 (!) 103/52  Pulse: 65  68 72  Resp: 19  18 18   Temp: 98.2 F (36.8 C)  98.3 F (36.8 C) 98.1 F (36.7 C)  TempSrc: Oral  Oral Oral  SpO2: 95%  96% 100%  Weight:  82.7 kg (182 lb 5.1 oz)  85.7 kg (188 lb 15 oz)  Height:          Intake/Output Summary (Last 24 hours)  at 07/12/2017 0959 Last data filed at 07/12/2017 0944 Gross per 24 hour  Intake 483 ml  Output 2500 ml  Net -2017 ml   Filed Weights   07/11/17 0555 07/11/17 1929 07/12/17 2229  Weight: 87.9 kg (193 lb 12.6 oz) 82.7 kg (182 lb 5.1 oz) 85.7 kg (188 lb 15 oz)    Examination:   General: Not in pain or dyspnea, deconditioned Neurology: Awake and alert, non focal  E ENT: mild pallor, no icterus, oral mucosa moist Cardiovascular: No JVD. S1-S2 present, rhythmic, no gallops, rubs, or murmurs. Trace lower extremity edema. Pulmonary: decreased breath sounds bilaterally at bases due to poor inspiratory effort, adequate air movement, no wheezing, rhonchi or rales. Gastrointestinal. Abdomen protuberant, no organomegaly, non tender, no rebound or guarding Skin. No rashes Musculoskeletal: no joint deformities     Data Reviewed: I have personally reviewed following labs and imaging studies  CBC: Recent Labs  Lab 07/06/17 1430 07/07/17 2213 07/08/17 0653 07/09/17 0741 07/11/17 1357  WBC 11.6* 12.5* 11.1* 10.7* 2.7*  NEUTROABS  --   --  9.0*  --   --   HGB 11.3* 12.5 12.4 11.8* 10.3*  HCT 34.7* 38.4 38.6 38.0 32.3*  MCV 95.9 97.0 98.5 100.0 99.4  PLT 217 216 193 191 798   Basic Metabolic Panel: Recent Labs  Lab 07/07/17 2214 07/08/17 0653 07/09/17 0635 07/10/17 0659 07/11/17 1358 07/12/17 0808  NA 134* 137 135 135 129* 133*  K 4.5 3.8 4.7 4.5 4.3 4.1  CL 98* 102 97* 97* 94* 96*  CO2 23 27 26 27 24 25   GLUCOSE 216* 86 208* 102* 247* 123*  BUN 103* 51* 33* 44* 53* 38*  CREATININE 2.50* 1.67* 1.82* 2.69* 3.42* 2.91*  CALCIUM 9.9 8.8* 8.4*  8.3* 8.9 8.4* 8.2*  PHOS 5.2*  --   --   --  3.5  --    GFR: Estimated Creatinine Clearance: 18.6 mL/min (A) (by C-G formula based on SCr of 2.91 mg/dL (H)). Liver Function Tests: Recent Labs  Lab 07/06/17 1430 07/07/17 2214 07/11/17 1358  ALT 61*  --   --   ALBUMIN  --  3.1* 2.6*   No  results for input(s): LIPASE, AMYLASE in the last 168 hours. No results for input(s): AMMONIA in the last 168 hours. Coagulation Profile: Recent Labs  Lab 07/08/17 0653 07/09/17 0635 07/10/17 0659 07/11/17 0845 07/12/17 0808  INR 1.86 2.06 2.63 2.85 2.27   Cardiac Enzymes: No results for input(s): CKTOTAL, CKMB, CKMBINDEX, TROPONINI in the last 168 hours. BNP (last 3 results) No results for input(s): PROBNP in the last 8760 hours. HbA1C: No results for input(s): HGBA1C in the last 72 hours. CBG: Recent Labs  Lab 07/11/17 0746 07/11/17 1207 07/11/17 1213 07/11/17 2329 07/12/17 0734  GLUCAP 87 192* 172* 203* 108*   Lipid Profile: No results for input(s): CHOL, HDL, LDLCALC, TRIG, CHOLHDL, LDLDIRECT in the last 72 hours. Thyroid Function Tests: No results for input(s): TSH, T4TOTAL, FREET4, T3FREE, THYROIDAB in the last 72 hours. Anemia Panel: No results for input(s): VITAMINB12, FOLATE, FERRITIN, TIBC, IRON, RETICCTPCT in the last 72 hours.    Radiology Studies: I have reviewed all of the imaging during this hospital visit personally     Scheduled Meds: . amiodarone  200 mg Oral Daily  . [START ON 07/14/2017] calcitRIOL  0.25 mcg Oral Once per day on Tue Thu Sat  . feeding supplement (NEPRO CARB STEADY)  237 mL Oral BID BM  . feeding supplement (PRO-STAT  SUGAR FREE 64)  30 mL Oral TID BM  . insulin aspart  0-20 Units Subcutaneous TID WC  . insulin aspart  0-5 Units Subcutaneous QHS  . insulin glargine  5 Units Subcutaneous Daily  . isosorbide mononitrate  30 mg Oral Daily  . levothyroxine  50 mcg Oral QAC breakfast  . multivitamin with minerals  1 tablet Oral Daily  . polyethylene glycol  17 g Oral Daily  . pravastatin  80 mg Oral QHS  . sodium chloride flush  3 mL Intravenous Q12H  . sodium chloride flush  3 mL Intravenous Q12H  . Warfarin - Pharmacist Dosing Inpatient   Does not apply q1800   Continuous Infusions: . sodium chloride    . sodium chloride         LOS: 10 days        Tawni Millers, MD [facility] Pager [phone]

## 2017-07-12 NOTE — Progress Notes (Signed)
Pt refusing CPAP at this time. RT Will continue to monitor.

## 2017-07-12 NOTE — Progress Notes (Signed)
ANTICOAGULATION CONSULT NOTE - Follow Up Consult  Pharmacy Consult for warfarin Indication: atrial fibrillation and VTE prophylaxis  No Known Allergies  Patient Measurements: Height: 5\' 5"  (165.1 cm) Weight: 188 lb 15 oz (85.7 kg) IBW/kg (Calculated) : 57   Vital Signs: Temp: 98.1 F (36.7 C) (01/20 0627) Temp Source: Oral (01/20 0627) BP: 103/52 (01/20 0627) Pulse Rate: 72 (01/20 0627)  Labs: Recent Labs    07/10/17 0659 07/11/17 0845 07/11/17 1357 07/11/17 1358 07/12/17 0808  HGB  --   --  10.3*  --   --   HCT  --   --  32.3*  --   --   PLT  --   --  165  --   --   LABPROT 27.9* 29.7*  --   --  24.9*  INR 2.63 2.85  --   --  2.27  CREATININE 2.69*  --   --  3.42* 2.91*    Estimated Creatinine Clearance: 18.6 mL/min (A) (by C-G formula based on SCr of 2.91 mg/dL (H)).  Medications:  Scheduled:  . amiodarone  200 mg Oral Daily  . [START ON 07/14/2017] calcitRIOL  0.25 mcg Oral Once per day on Tue Thu Sat  . feeding supplement (NEPRO CARB STEADY)  237 mL Oral BID BM  . feeding supplement (PRO-STAT SUGAR FREE 64)  30 mL Oral TID BM  . insulin aspart  0-20 Units Subcutaneous TID WC  . insulin aspart  0-5 Units Subcutaneous QHS  . insulin glargine  5 Units Subcutaneous Daily  . isosorbide mononitrate  30 mg Oral Daily  . levothyroxine  50 mcg Oral QAC breakfast  . multivitamin with minerals  1 tablet Oral Daily  . polyethylene glycol  17 g Oral Daily  . pravastatin  80 mg Oral QHS  . sodium chloride flush  3 mL Intravenous Q12H  . sodium chloride flush  3 mL Intravenous Q12H  . Warfarin - Pharmacist Dosing Inpatient   Does not apply q1800    Assessment: 72 y.o female presented with SOB and recent admit on 1/4 for CHF exacerbation.  On warfarin for afib prior to admission with therapeutic INR 3 upon admission.   Home regimen: 1.25 mg on MWF and 2.5 mg all other days (weekly dose 13.75mg ).  INR trended down from 3.1 to 2.02 after holding dose 1/11.  INR trended  down further after 3 boost doses. Now remains therapeutic.  s/p TEE/DCCV Afib > SR 1/18 - will aim to keep INR > 2. Amiodarone dose slightly higher than home dose.  No overt bleeding or complications noted. CBC stable.  Goal of Therapy:  INR 2-3 Monitor platelets by anticoagulation protocol: Yes   Plan:  Warfarin 2.5mg  PO x 1 tonight Monitor daily INR, CBC, signs/symptoms of bleeding   Elicia Lamp, PharmD, BCPS Clinical Pharmacist Clinical phone for 07/12/2017 until 3:30pm: [phone] If after 3:30pm, please call main pharmacy at: [phone] 07/12/2017 11:36 AM

## 2017-07-12 NOTE — Progress Notes (Signed)
Progress Note   Subjective   Weak from dialysis yesterday no dyspnea   Inpatient Medications    Scheduled Meds: . amiodarone  200 mg Oral Daily  . [START ON 07/14/2017] calcitRIOL  0.25 mcg Oral Once per day on Tue Thu Sat  . feeding supplement (NEPRO CARB STEADY)  237 mL Oral BID BM  . feeding supplement (PRO-STAT SUGAR FREE 64)  30 mL Oral TID BM  . insulin aspart  0-20 Units Subcutaneous TID WC  . insulin aspart  0-5 Units Subcutaneous QHS  . insulin glargine  5 Units Subcutaneous Daily  . isosorbide mononitrate  30 mg Oral Daily  . levothyroxine  50 mcg Oral QAC breakfast  . multivitamin with minerals  1 tablet Oral Daily  . polyethylene glycol  17 g Oral Daily  . pravastatin  80 mg Oral QHS  . sodium chloride flush  3 mL Intravenous Q12H  . sodium chloride flush  3 mL Intravenous Q12H  . Warfarin - Pharmacist Dosing Inpatient   Does not apply q1800   Continuous Infusions: . sodium chloride    . sodium chloride     PRN Meds: sodium chloride, acetaminophen **OR** acetaminophen, albuterol, guaiFENesin-dextromethorphan, lidocaine-prilocaine, midodrine, ondansetron **OR** ondansetron (ZOFRAN) IV, oxyCODONE-acetaminophen **AND** oxyCODONE, senna-docusate, sodium chloride flush, sodium chloride flush, zolpidem   Vital Signs    Vitals:   07/11/17 1738 07/11/17 1929 07/11/17 2100 07/12/17 0627  BP: (!) 97/49  (!) 93/53 (!) 103/52  Pulse: 65  68 72  Resp: 19  18 18   Temp: 98.2 F (36.8 C)  98.3 F (36.8 C) 98.1 F (36.7 C)  TempSrc: Oral  Oral Oral  SpO2: 95%  96% 100%  Weight:  182 lb 5.1 oz (82.7 kg)  188 lb 15 oz (85.7 kg)  Height:        Intake/Output Summary (Last 24 hours) at 07/12/2017 1047 Last data filed at 07/12/2017 0944 Gross per 24 hour  Intake 483 ml  Output 2500 ml  Net -2017 ml   Filed Weights   07/11/17 0555 07/11/17 1929 07/12/17 0627  Weight: 193 lb 12.6 oz (87.9 kg) 182 lb 5.1 oz (82.7 kg) 188 lb 15 oz (85.7 kg)    Telemetry    Sinus  rhythm 07/12/2017 - Personally Reviewed  Physical Exam  Affect appropriate Chronically ill white female  HEENT: eschar on left nares  JVP normal no bruits no thyromegaly Lungs clear with no wheezing and good diaphragmatic motion Heart:  S1/S2 no murmur, no rub, gallop or click PMI enlarged  Abdomen: benighn, BS positve, no tenderness, no AAA no bruit.  No HSM or HJR Distal pulses intact with no bruits No edema Neuro non-focal Skin warm and dry No muscular weakness RUE AVF     Labs    Chemistry Recent Labs  Lab 07/06/17 1430  07/07/17 2214  07/10/17 0659 07/11/17 1358 07/12/17 0808  NA  --    < > 134*   < > 135 129* 133*  K  --    < > 4.5   < > 4.5 4.3 4.1  CL  --    < > 98*   < > 97* 94* 96*  CO2  --    < > 23   < > 27 24 25   GLUCOSE  --    < > 216*   < > 102* 247* 123*  BUN  --    < > 103*   < > 44* 53* 38*  CREATININE  --    < >  2.50*   < > 2.69* 3.42* 2.91*  CALCIUM  --    < > 9.9   < > 8.9 8.4* 8.2*  ALBUMIN  --   --  3.1*  --   --  2.6*  --   ALT 61*  --   --   --   --   --   --   GFRNONAA  --    < > 18*   < > 16* 12* 15*  GFRAA  --    < > 21*   < > 19* 14* 17*  ANIONGAP  --    < > 13   < > 11 11 12    < > = values in this interval not displayed.     Hematology Recent Labs  Lab 07/08/17 0653 07/09/17 0741 07/11/17 1357  WBC 11.1* 10.7* 2.7*  RBC 3.92 3.80* 3.25*  HGB 12.4 11.8* 10.3*  HCT 38.6 38.0 32.3*  MCV 98.5 100.0 99.4  MCH 31.6 31.1 31.7  MCHC 32.1 31.1 31.9  RDW 16.9* 17.0* 16.3*  PLT 193 191 165    Cardiac EnzymesNo results for input(s): TROPONINI in the last 168 hours. No results for input(s): TROPIPOC in the last 168 hours.      Assessment & Plan    1.  Persistent atrial fibrillation in NSR continue coumadin and amiodarone   2. Acute on chronic systolic CHF she makes some urine continue dialysis improved   3. Stage V renal failure Appreciate nephrology input  4. COPD Stable No change required today  Jenkins Rouge

## 2017-07-12 NOTE — Progress Notes (Signed)
[facility] Progress Note   Assessment/ Plan:    1. 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  Still vol overloaded but gradually improving with HD.  Advanced HF following.  She has had a history of enterococcal endocarditis necessitating removal of defib in 2016 and hasn't been replaced. On Imdur (Coreg stopped).  2.  Afib: on warfarin and amiodarone.  S.p TEE-DCCV 1/18 return to NSR.  3. 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  [facility] has been by to eval fistula- duplex shows adequate size of fistula.  Being cannulated successfully.  CLIP in process. Next HD today 1/22.  Midodrine before HD is helping Bps.  EMLA cream ordered.  Coming to HD in chair.  4. Anemia: Hgb 10.3, start ESA with next HD- Aranesp 60 mcg q Tuesday  5. CKD-MBD: checking PTH, on calcitriol 0.25 mcg TIW.  Phos 5.2, no binders yet  6. Nutrition: Albumin 3.6  7. Hypertension: as above in #1  8: Dispo: pending, for SNF  Subjective:    S/p HD yesterday in the chair- tolerated well.    Objective:   BP (!) 103/52 (BP Location: Left Arm)   Pulse 72   Temp 98.1 F (36.7 C) (Oral)   Resp 18   Ht 5\' 5"  (1.651 m)   Wt 85.7 kg (188 lb 15 oz)   SpO2 100%   BMI 31.44 kg/m   Physical Exam: Gen: older woman, lying in bed, appears to be feeling better HEENT: 4-5 cm JVD, improved CVS: RRR soft systolic murmur Resp: clear anteriorly, no c/w/r, muffled at bases.  Has stopped intermittently coughing Abd: soft nontender NABS Ext: trace LE edema ACCESS: RUE AVF + T/B, tortuous Labs: BMET Recent Labs  Lab 07/07/17 0623 07/07/17 2214 07/08/17 0653 07/09/17 0635 07/10/17 0659 07/11/17 1358 07/12/17 0808  NA 132* 134* 137 135 135 129* 133*  K 4.3 4.5 3.8 4.7 4.5 4.3 4.1  CL 98* 98* 102 97* 97* 94* 96*  CO2 22 23 27 26 27 24 25   GLUCOSE 131* 216* 86 208* 102* 247* 123*  BUN 96* 103* 51* 33* 44* 53* 38*  CREATININE 2.38* 2.50* 1.67* 1.82* 2.69* 3.42*  2.91*  CALCIUM 9.4 9.9 8.8* 8.4*  8.3* 8.9 8.4* 8.2*  PHOS  --  5.2*  --   --   --  3.5  --    CBC Recent Labs  Lab 07/07/17 2213 07/08/17 0653 07/09/17 0741 07/11/17 1357  WBC 12.5* 11.1* 10.7* 2.7*  NEUTROABS  --  9.0*  --   --   HGB 12.5 12.4 11.8* 10.3*  HCT 38.4 38.6 38.0 32.3*  MCV 97.0 98.5 100.0 99.4  PLT 216 193 191 165    @IMGRELPRIORS @ Medications:    . amiodarone  200 mg Oral Daily  . [START ON 07/14/2017] calcitRIOL  0.25 mcg Oral Once per day on Tue Thu Sat  . feeding supplement (NEPRO CARB STEADY)  237 mL Oral BID BM  . feeding supplement (PRO-STAT SUGAR FREE 64)  30 mL Oral TID BM  . insulin aspart  0-20 Units Subcutaneous TID WC  . insulin aspart  0-5 Units Subcutaneous QHS  . insulin glargine  5 Units Subcutaneous Daily  . isosorbide mononitrate  30 mg Oral Daily  . levothyroxine  50 mcg Oral QAC breakfast  . multivitamin with minerals  1 tablet Oral Daily  . polyethylene glycol  17 g Oral Daily  . pravastatin  80 mg Oral QHS  .  sodium chloride flush  3 mL Intravenous Q12H  . sodium chloride flush  3 mL Intravenous Q12H  . warfarin  2.5 mg Oral ONCE-1800  . Warfarin - Pharmacist Dosing Inpatient   Does not apply [place], MD [facility] pgr [phone] 07/12/2017, 12:32 PM

## 2017-07-13 ENCOUNTER — Encounter (HOSPITAL_COMMUNITY): Payer: Self-pay | Admitting: Cardiology

## 2017-07-13 LAB — GLUCOSE, CAPILLARY
GLUCOSE-CAPILLARY: 125 mg/dL — AB (ref 65–99)
GLUCOSE-CAPILLARY: 170 mg/dL — AB (ref 65–99)
GLUCOSE-CAPILLARY: 155 mg/dL — AB (ref 65–99)
GLUCOSE-CAPILLARY: 142 mg/dL — AB (ref 65–99)

## 2017-07-13 LAB — BASIC METABOLIC PANEL
Anion gap: 12 (ref 5–15)
BUN: 65 mg/dL — AB (ref 6–20)
CALCIUM: 8.6 mg/dL — AB (ref 8.9–10.3)
CHLORIDE: 97 mmol/L — AB (ref 101–111)
CO2: 25 mmol/L (ref 22–32)
CREATININE: 3.76 mg/dL — AB (ref 0.44–1.00)
GFR calc Af Amer: 13 mL/min — ABNORMAL LOW (ref >60)
GFR calc non Af Amer: 11 mL/min — ABNORMAL LOW (ref >60)
GLUCOSE: 122 mg/dL — AB (ref 65–99)
Potassium: 4.2 mmol/L (ref 3.5–5.1)
Sodium: 134 mmol/L — ABNORMAL LOW (ref 135–145)

## 2017-07-13 LAB — IRON AND TIBC
Iron: 40 ug/dL (ref 28–170)
Saturation Ratios: 20 % (ref 10.4–31.8)
TIBC: 202 ug/dL — AB (ref 250–450)
UIBC: 162 ug/dL

## 2017-07-13 LAB — PROTIME-INR
INR: 2.21
PROTHROMBIN TIME: 24.3 s — AB (ref 11.4–15.2)

## 2017-07-13 LAB — FERRITIN: Ferritin: 185 ng/mL (ref 11–307)

## 2017-07-13 MED ORDER — DARBEPOETIN ALFA 60 MCG/0.3ML IJ SOSY
60.0000 ug | PREFILLED_SYRINGE | INTRAMUSCULAR | Status: DC
  Administered 2017-07-14: 60 ug via INTRAVENOUS

## 2017-07-13 MED ORDER — WARFARIN SODIUM 2.5 MG PO TABS
2.5000 mg | ORAL_TABLET | Freq: Every day | ORAL | Status: DC
  Administered 2017-07-13 – 2017-07-14 (×2): 2.5 mg via ORAL
  Filled 2017-07-13 (×2): qty 1

## 2017-07-13 NOTE — Progress Notes (Addendum)
PROGRESS NOTE    [name]  XVQ:[id number] DOB: 13-Jul-1945 DOA: 07/02/2017 PCP: Lemmie Evens, MD    Brief Narrative:  72 yo female presents with dyspnea and generalized weakness. Patient is known to have a significant medical history of nonischemic cardiomyopathy EF 20%,atrial fibrillation,type 2 diabetes mellitus,stage IV chronic kidney disease.Complaint of worsening dyspnea for last 4 days,associated with wheezing and decreased functional physical capacity.On initial physical examination blood pressure 101/78, heart rate 73, oxygen saturation 95% on supplemental oxygen, moist mucous membranes, lungs clear to auscultation bilaterally, heart S1-S2 present rhythmic irregularly irregular,the abdomen was soft nontender, no lower extremity edema.Sodium 135, potassium 4.4, chloride 99, bicarbonate 17, glucose 29,BUN 109, creatinine 4.11, white count 7.4, hemoglobin 11.1, hematocrit 35.5,platelets242.BNP 1605,EKG left bundle branch block,atrial fibrillation 95 beats per minute,left axis deviation.  Patient was admitted with working diagnosis of COPD exacerbation, complicated with acute on chronic systolic heart failure.  Patient is tolerating HD well, improve volume status, underwent successful electrical cardioversion.   Currently waiting for placement at SNF.   Assessment & Plan:   Principal Problem:   COPD (chronic obstructive pulmonary disease) (HCC) Active Problems:   Dyslipidemia   HTN (hypertension)   History of pulmonary embolism   Type 2 diabetes, uncontrolled, with renal manifestation (HCC)   Chronic renal disease, stage 4, severely decreased glomerular filtration rate (GFR) between 15-29 mL/min/1.73 square meter (HCC)   Chronic anticoagulation   Atrial fibrillation (HCC)   Implantable cardioverter-defibrillator-CRT- Mdt   Anemia in chronic renal disease   Hypothyroidism   SOB (shortness of breath)   Acute on chronic systolic CHF (congestive heart  failure), NYHA class 4 (HCC)   Palliative care by specialist   Goals of care, counseling/discussion   Generalized weakness   Nonischemic cardiomyopathy ([place])   Hyperglycemia   Dependence on renal dialysis ([place])   Leukocytosis   Acute blood loss anemia   1. Systolicheart failure decompensation.Continue HD +UF, for volume management. On isosorbide.   2.COPD.Stable with nosigns of exacerbation,  bronchodilator therapy withduoneb, as needed. Oxygenating well on room air.  3.Acute on chronic kidney disease stage IVstarted HD on this admission.Will need HD facility as outpatient, as needed midodrine on HD for hypotension.   4.Paroxysmal atrial fibrillation/ sp electrical cardioversion.Continue to be on sinus rhythm, anticoagulation with warfarin.INR at 2,21. Continue protocol per pharmacy. Continue amiodarone.   5.Type 2 diabetes mellitus.On insulin sliding scale for glucose cover and monitoring, capillary glucose 192, 123, 233, 125, 170.Continue basal insulin with 5 units daily.  6.Hypothyroidism.Continue withlevothyroxine.   7. Dyslipidemia. On pravastatin.    DVT prophylaxis:warfarin Code Status:full Family Communication:I spoke with patient's family at the bedside and all questions were addressed. Disposition Plan:home or snf   Consultants:  Cardiology  nephrology  Procedures:    Antimicrobials   Subjective: Patient feeling well, very deconditioned and weak, no nausea or vomiting, no dyspnea at rest. Burning chest pain continue to improve.   Objective: Vitals:   07/12/17 0627 07/12/17 1130 07/12/17 1636 07/13/17 0535  BP: (!) 103/52 (!) 104/52 (!) 107/50 (!) 104/52  Pulse: 72 72 71 74  Resp: 18 18 16 18   Temp: 98.1 F (36.7 C) 97.9 F (36.6 C) 97.7 F (36.5 C) 98.2 F (36.8 C)  TempSrc: Oral Oral Oral Oral  SpO2: 100% 99% 99% 100%  Weight: 85.7 kg (188 lb 15 oz)   87 kg (191 lb 12.8 oz)  Height:        Intake/Output  Summary (Last 24 hours) at 07/13/2017 1031 Last  data filed at 07/13/2017 0823 Gross per 24 hour  Intake 630 ml  Output 350 ml  Net 280 ml   Filed Weights   07/11/17 1929 07/12/17 0627 07/13/17 0535  Weight: 82.7 kg (182 lb 5.1 oz) 85.7 kg (188 lb 15 oz) 87 kg (191 lb 12.8 oz)    Examination:   General: Not in pain or dyspnea, deconditioned Neurology: Awake and alert, non focal  E ENT: no pallor, no icterus, oral mucosa moist Cardiovascular: No JVD. S1-S2 present, rhythmic, no gallops, rubs, or murmurs. Trace lower extremity edema. Pulmonary: decreased breath sounds bilaterally at bases due to poor inspiratory effort, adequate air movement, no wheezing, rhonchi or rales. Gastrointestinal. Abdomen flat, no organomegaly, non tender, no rebound or guarding Skin. No rashes Musculoskeletal: no joint deformities     Data Reviewed: I have personally reviewed following labs and imaging studies  CBC: Recent Labs  Lab 07/06/17 1430 07/07/17 2213 07/08/17 0653 07/09/17 0741 07/11/17 1357  WBC 11.6* 12.5* 11.1* 10.7* 2.7*  NEUTROABS  --   --  9.0*  --   --   HGB 11.3* 12.5 12.4 11.8* 10.3*  HCT 34.7* 38.4 38.6 38.0 32.3*  MCV 95.9 97.0 98.5 100.0 99.4  PLT 217 216 193 191 045   Basic Metabolic Panel: Recent Labs  Lab 07/07/17 2214  07/09/17 0635 07/10/17 0659 07/11/17 1358 07/12/17 0808 07/13/17 0422  NA 134*   < > 135 135 129* 133* 134*  K 4.5   < > 4.7 4.5 4.3 4.1 4.2  CL 98*   < > 97* 97* 94* 96* 97*  CO2 23   < > 26 27 24 25 25   GLUCOSE 216*   < > 208* 102* 247* 123* 122*  BUN 103*   < > 33* 44* 53* 38* 65*  CREATININE 2.50*   < > 1.82* 2.69* 3.42* 2.91* 3.76*  CALCIUM 9.9   < > 8.4*  8.3* 8.9 8.4* 8.2* 8.6*  PHOS 5.2*  --   --   --  3.5  --   --    < > = values in this interval not displayed.   GFR: Estimated Creatinine Clearance: 14.5 mL/min (A) (by C-G formula based on SCr of 3.76 mg/dL (H)). Liver Function Tests: Recent Labs  Lab 07/06/17 1430  07/07/17 2214 07/11/17 1358  ALT 61*  --   --   ALBUMIN  --  3.1* 2.6*   No results for input(s): LIPASE, AMYLASE in the last 168 hours. No results for input(s): AMMONIA in the last 168 hours. Coagulation Profile: Recent Labs  Lab 07/09/17 0635 07/10/17 0659 07/11/17 0845 07/12/17 0808 07/13/17 0422  INR 2.06 2.63 2.85 2.27 2.21   Cardiac Enzymes: No results for input(s): CKTOTAL, CKMB, CKMBINDEX, TROPONINI in the last 168 hours. BNP (last 3 results) No results for input(s): PROBNP in the last 8760 hours. HbA1C: No results for input(s): HGBA1C in the last 72 hours. CBG: Recent Labs  Lab 07/12/17 0734 07/12/17 1159 07/12/17 1642 07/12/17 2059 07/13/17 0738  GLUCAP 108* 192* 123* 223* 125*   Lipid Profile: No results for input(s): CHOL, HDL, LDLCALC, TRIG, CHOLHDL, LDLDIRECT in the last 72 hours. Thyroid Function Tests: No results for input(s): TSH, T4TOTAL, FREET4, T3FREE, THYROIDAB in the last 72 hours. Anemia Panel: Recent Labs    07/13/17 0422  FERRITIN 185  TIBC 202*  IRON 40      Radiology Studies: I have reviewed all of the imaging during this hospital visit personally  Scheduled Meds: . amiodarone  200 mg Oral Daily  . [START ON 07/14/2017] calcitRIOL  0.25 mcg Oral Once per day on Tue Thu Sat  . [START ON 07/14/2017] darbepoetin (ARANESP) injection - DIALYSIS  60 mcg Intravenous Q Tue-HD  . feeding supplement (NEPRO CARB STEADY)  237 mL Oral BID BM  . feeding supplement (PRO-STAT SUGAR FREE 64)  30 mL Oral TID BM  . insulin aspart  0-20 Units Subcutaneous TID WC  . insulin aspart  0-5 Units Subcutaneous QHS  . insulin glargine  5 Units Subcutaneous Daily  . isosorbide mononitrate  30 mg Oral Daily  . levothyroxine  50 mcg Oral QAC breakfast  . multivitamin with minerals  1 tablet Oral Daily  . polyethylene glycol  17 g Oral Daily  . pravastatin  80 mg Oral QHS  . sodium chloride flush  3 mL Intravenous Q12H  . sodium chloride flush  3 mL  Intravenous Q12H  . Warfarin - Pharmacist Dosing Inpatient   Does not apply q1800   Continuous Infusions: . sodium chloride    . sodium chloride       LOS: 11 days        Tawni Millers, MD [facility] Pager [phone]

## 2017-07-13 NOTE — Progress Notes (Signed)
ANTICOAGULATION CONSULT NOTE - Follow Up Consult  Pharmacy Consult for warfarin Indication: atrial fibrillation and VTE prophylaxis  No Known Allergies  Patient Measurements: Height: 5\' 5"  (165.1 cm) Weight: 191 lb 12.8 oz (87 kg) IBW/kg (Calculated) : 57   Vital Signs: Temp: 98.6 F (37 C) (01/21 1240) Temp Source: Oral (01/21 1240) BP: 107/48 (01/21 1240) Pulse Rate: 69 (01/21 1240)  Labs: Recent Labs    07/11/17 0845 07/11/17 1357 07/11/17 1358 07/12/17 0808 07/13/17 0422  HGB  --  10.3*  --   --   --   HCT  --  32.3*  --   --   --   PLT  --  165  --   --   --   LABPROT 29.7*  --   --  24.9* 24.3*  INR 2.85  --   --  2.27 2.21  CREATININE  --   --  3.42* 2.91* 3.76*    Estimated Creatinine Clearance: 14.5 mL/min (A) (by C-G formula based on SCr of 3.76 mg/dL (H)).  Medications:  Scheduled:  . amiodarone  200 mg Oral Daily  . [START ON 07/14/2017] calcitRIOL  0.25 mcg Oral Once per day on Tue Thu Sat  . [START ON 07/14/2017] darbepoetin (ARANESP) injection - DIALYSIS  60 mcg Intravenous Q Tue-HD  . feeding supplement (NEPRO CARB STEADY)  237 mL Oral BID BM  . feeding supplement (PRO-STAT SUGAR FREE 64)  30 mL Oral TID BM  . insulin aspart  0-20 Units Subcutaneous TID WC  . insulin aspart  0-5 Units Subcutaneous QHS  . insulin glargine  5 Units Subcutaneous Daily  . isosorbide mononitrate  30 mg Oral Daily  . levothyroxine  50 mcg Oral QAC breakfast  . multivitamin with minerals  1 tablet Oral Daily  . polyethylene glycol  17 g Oral Daily  . pravastatin  80 mg Oral QHS  . sodium chloride flush  3 mL Intravenous Q12H  . sodium chloride flush  3 mL Intravenous Q12H  . warfarin  2.5 mg Oral q1800  . Warfarin - Pharmacist Dosing Inpatient   Does not apply q1800    Assessment: 72 y.o female presented with SOB and recent admit on 1/4 for CHF exacerbation.  On warfarin for afib prior to admission with therapeutic INR 3 upon admission.   Home regimen: 1.25 mg on  MWF and 2.5 mg all other days (weekly dose 13.75mg ).  INR previously labile -  Now remains therapeutic.  INR 2.2 s/p TEE/DCCV Afib > SR 1/18 - will aim to keep INR > 2. Amiodarone dose slightly higher than home dose.  No overt bleeding or complications noted. CBC stable.  Goal of Therapy:  INR 2-3 Monitor platelets by anticoagulation protocol: Yes   Plan:  Warfarin 2.5mg  PO daily  Monitor daily INR, CBC, signs/symptoms of bleeding  Bonnita Nasuti Pharm.D. CPP, BCPS Clinical Pharmacist [phone] 07/13/2017 2:37 PM

## 2017-07-13 NOTE — Progress Notes (Signed)
Patient ID: [name], female   DOB: 1945/07/05, 72 y.o.   MRN: [id number]     Advanced Heart Failure Rounding Note  Primary Cardiologist: Aundra Dubin  Subjective:    S/p DCCV 07/10/17. Maintaining NSR  No complaints this am. Dialyzing on T/Th/Sat schedule. Denies SOB. No lightheadedness or dizziness.   Objective:   Weight Range: 191 lb 12.8 oz (87 kg) Body mass index is 31.92 kg/m.   Vital Signs:   Temp:  [97.7 F (36.5 C)-98.2 F (36.8 C)] 98.2 F (36.8 C) (01/21 0535) Pulse Rate:  [71-74] 74 (01/21 0535) Resp:  [16-18] 18 (01/21 0535) BP: (104-107)/(50-52) 104/52 (01/21 0535) SpO2:  [99 %-100 %] 100 % (01/21 0535) Weight:  [191 lb 12.8 oz (87 kg)] 191 lb 12.8 oz (87 kg) (01/21 0535) Last BM Date: 07/10/17  Weight change: Filed Weights   07/11/17 1929 07/12/17 0627 07/13/17 0535  Weight: 182 lb 5.1 oz (82.7 kg) 188 lb 15 oz (85.7 kg) 191 lb 12.8 oz (87 kg)   Intake/Output:   Intake/Output Summary (Last 24 hours) at 07/13/2017 0746 Last data filed at 07/13/2017 0600 Gross per 24 hour  Intake 513 ml  Output 350 ml  Net 163 ml    Physical Exam   General: Elderly appearing. No resp difficulty. HEENT: Normal Neck: Supple. JVP ~7-8 cm. Carotids 2+ bilat; no bruits. No thyromegaly or nodule noted. Cor: PMI nondisplaced. RRR, No M/G/R noted Lungs: Clear anteriorly Abdomen: Soft, non-tender, non-distended, no HSM. No bruits or masses. +BS  Extremities: No cyanosis, clubbing, or rash. RUE AVF. No edema. Neuro: Alert & orientedx3, cranial nerves grossly intact. moves all 4 extremities w/o difficulty. Affect pleasant   Telemetry   Maintaining NSR, 80s, personally reviewed.   EKG    No new tracings.      Labs    CBC Recent Labs    07/11/17 1357  WBC 2.7*  HGB 10.3*  HCT 32.3*  MCV 99.4  PLT 664   Basic Metabolic Panel Recent Labs    07/11/17 1358 07/12/17 0808 07/13/17 0422  NA 129* 133* 134*  K 4.3 4.1 4.2  CL 94* 96* 97*  CO2 24 25 25   GLUCOSE  247* 123* 122*  BUN 53* 38* 65*  CREATININE 3.42* 2.91* 3.76*  CALCIUM 8.4* 8.2* 8.6*  PHOS 3.5  --   --    Liver Function Tests Recent Labs    07/11/17 1358  ALBUMIN 2.6*   No results for input(s): LIPASE, AMYLASE in the last 72 hours. Cardiac Enzymes No results for input(s): CKTOTAL, CKMB, CKMBINDEX, TROPONINI in the last 72 hours.  BNP: BNP (last 3 results) Recent Labs    06/26/17 0806 07/02/17 1131 07/03/17 0501  BNP 2,226.0* 1,605.1* 2,179.2*    ProBNP (last 3 results) No results for input(s): PROBNP in the last 8760 hours.   D-Dimer No results for input(s): DDIMER in the last 72 hours. Hemoglobin A1C No results for input(s): HGBA1C in the last 72 hours. Fasting Lipid Panel No results for input(s): CHOL, HDL, LDLCALC, TRIG, CHOLHDL, LDLDIRECT in the last 72 hours. Thyroid Function Tests No results for input(s): TSH, T4TOTAL, T3FREE, THYROIDAB in the last 72 hours.  Invalid input(s): FREET3  Other results:   Imaging    No results found.   Medications:     Scheduled Medications: . amiodarone  200 mg Oral Daily  . [START ON 07/14/2017] calcitRIOL  0.25 mcg Oral Once per day on Tue Thu Sat  . feeding supplement (NEPRO CARB  STEADY)  237 mL Oral BID BM  . feeding supplement (PRO-STAT SUGAR FREE 64)  30 mL Oral TID BM  . insulin aspart  0-20 Units Subcutaneous TID WC  . insulin aspart  0-5 Units Subcutaneous QHS  . insulin glargine  5 Units Subcutaneous Daily  . isosorbide mononitrate  30 mg Oral Daily  . levothyroxine  50 mcg Oral QAC breakfast  . multivitamin with minerals  1 tablet Oral Daily  . polyethylene glycol  17 g Oral Daily  . pravastatin  80 mg Oral QHS  . sodium chloride flush  3 mL Intravenous Q12H  . sodium chloride flush  3 mL Intravenous Q12H  . Warfarin - Pharmacist Dosing Inpatient   Does not apply q1800    Infusions: . sodium chloride    . sodium chloride      PRN Medications: sodium chloride, acetaminophen **OR**  acetaminophen, albuterol, guaiFENesin-dextromethorphan, lidocaine-prilocaine, midodrine, ondansetron **OR** ondansetron (ZOFRAN) IV, oxyCODONE-acetaminophen **AND** oxyCODONE, senna-docusate, sodium chloride flush, sodium chloride flush, zolpidem    Patient Profile   72 yo with history of chronic systolic CHF from nonischemic cardiomyopathy, prior PE, CKD IV-V, and paroxysmal atrial fibrillation presented with dyspnea and progressive renal dysfunction.   Assessment/Plan   1. Acute on chronic systolic CHF: Echo (4/27) with EF 20-25%, nonischemic cardiomyopathy.  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   - Volume status stable with dialysis.   - Suspect the majority of her dyspnea is due to CHF, COPD probably plays a lesser role.  - Tolerating HD thus far.  - No ACE/ARB/ARNI with ESRD. - Coreg stopped 1/14 due to low BP.  2. 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  - new this admission on HD, but had AVF in place - Nephrology following.  Dialyzing on T/Th/Sat schedule.  3. Atrial fibrillation: Persistent.  Was in NSR in 9/18 but has been in atrial fibrillation since admitted this time.  - Remains in NSR s/p DCCV 07/10/17 - Continue amiodarone 200 mg daily.  - Continue warfarin, INR 2.21 this am.   - Her PCP manages her INR as outpatient. Will need follow up.  4. OSA:  - Continue nightly CPAP. No change. 5. COPD:  - Per primary.  6. Deconditioning: - PT/OT recommending SNF. No change.  Relatively stable from a HF perspective back in NSR. Pressures soft in low 100s.   Length of Stay: [address]  Annamaria Helling  07/13/2017, 7:46 AM  Advanced Heart Failure Team Pager [phone] (M-F; 7a - 4p)  Please contact [facility] for night-coverage after hours (4p -7a ) and weekends on [email or web address]  Patient seen with PA, agree with the above note.  She continues with HD for volume removal.  Had to stop Coreg with soft BP and  requiring midodrine prior to HD.   She remains in NSR on amiodarone after DCCV.   Very weak, will need SNF.   Loralie Champagne 07/13/2017 10:30 AM

## 2017-07-13 NOTE — Progress Notes (Signed)
[facility] Progress Note   Assessment/ Plan:    1. 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  Still vol overloaded but gradually improving with HD.  Advanced HF following.  She has had a history of enterococcal endocarditis necessitating removal of defib in 2016 and hasn't been replaced. On Imdur (Coreg stopped).  2.  Afib: on warfarin and amiodarone.  S.p TEE-DCCV 1/18 return to NSR.  3. 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  [facility] has been by to eval fistula- duplex shows adequate size of fistula.  Being cannulated successfully.  CLIP in process. Next HD Tuesday 1/22.  Midodrine before HD is helping Bps.  EMLA cream ordered.  Coming to HD in chair. Has spot second shift [facility] TTS- can go there as early as Tuesday 1/22- will write orders for here in AM in case does not go   4. Anemia: Hgb 10.3, start ESA with next HD- Aranesp 60 mcg q Tuesday.  WBC dropped to 2.7- ?error? Check tomorrow  5. CKD-MBD: checking PTH- pending, on calcitriol 0.25 mcg TIW.  Phos 5.2--3.9, no binders yet  6. Nutrition: Albumin 3.6  7. Hypertension: as above in #1  8: Dispo: pending, for SNF- from our standpoint ready for d/c  Subjective:   Soft spoken- did not eat much breakfast, [facility] will be discharged today    Objective:   BP (!) 104/52 (BP Location: Left Arm)   Pulse 74   Temp 98.2 F (36.8 C) (Oral)   Resp 18   Ht 5\' 5"  (1.651 m)   Wt 87 kg (191 lb 12.8 oz)   SpO2 100%   BMI 31.92 kg/m   Physical Exam: Gen: older woman, lying in bed, appears to be feeling better HEENT: 4-5 cm JVD, improved CVS: RRR soft systolic murmur Resp: clear anteriorly, no c/w/r, muffled at bases.  Has stopped intermittently coughing Abd: soft nontender NABS Ext: trace LE edema ACCESS: RUE AVF + T/B, tortuous Labs: BMET Recent Labs  Lab 07/07/17 2214 07/08/17 0653 07/09/17 0635  07/10/17 0659 07/11/17 1358 07/12/17 0808 07/13/17 0422  NA 134* 137 135 135 129* 133* 134*  K 4.5 3.8 4.7 4.5 4.3 4.1 4.2  CL 98* 102 97* 97* 94* 96* 97*  CO2 23 27 26 27 24 25 25   GLUCOSE 216* 86 208* 102* 247* 123* 122*  BUN 103* 51* 33* 44* 53* 38* 65*  CREATININE 2.50* 1.67* 1.82* 2.69* 3.42* 2.91* 3.76*  CALCIUM 9.9 8.8* 8.4*  8.3* 8.9 8.4* 8.2* 8.6*  PHOS 5.2*  --   --   --  3.5  --   --    CBC Recent Labs  Lab 07/07/17 2213 07/08/17 0653 07/09/17 0741 07/11/17 1357  WBC 12.5* 11.1* 10.7* 2.7*  NEUTROABS  --  9.0*  --   --   HGB 12.5 12.4 11.8* 10.3*  HCT 38.4 38.6 38.0 32.3*  MCV 97.0 98.5 100.0 99.4  PLT 216 193 191 165    @IMGRELPRIORS @ Medications:    . amiodarone  200 mg Oral Daily  . [START ON 07/14/2017] calcitRIOL  0.25 mcg Oral Once per day on Tue Thu Sat  . feeding supplement (NEPRO CARB STEADY)  237 mL Oral BID BM  . feeding supplement (PRO-STAT SUGAR FREE 64)  30 mL Oral TID BM  . insulin aspart  0-20 Units Subcutaneous TID WC  . insulin  aspart  0-5 Units Subcutaneous QHS  . insulin glargine  5 Units Subcutaneous Daily  . isosorbide mononitrate  30 mg Oral Daily  . levothyroxine  50 mcg Oral QAC breakfast  . multivitamin with minerals  1 tablet Oral Daily  . polyethylene glycol  17 g Oral Daily  . pravastatin  80 mg Oral QHS  . sodium chloride flush  3 mL Intravenous Q12H  . sodium chloride flush  3 mL Intravenous Q12H  . Warfarin - Pharmacist Dosing Inpatient   Does not apply q1800     Ayesha Markwell A  07/13/2017, 8:42 AM

## 2017-07-13 NOTE — Progress Notes (Signed)
PT Cancellation Note  Patient Details Name: [name] MRN: [id number] DOB: Oct 26, 1945   Cancelled Treatment:    Reason Eval/Treat Not Completed: Patient declined, reported awaiting transfer to SNF and declined to participate 2/2 such.  Will f/u per POC if she remains admitted.   Shann Medal, PT, DPT 07/13/17 2:41 PM

## 2017-07-13 NOTE — Social Work (Addendum)
CSW met with pt at bedside, pt would like to accept placement at [facility]. CSW to follow up with [facility] regarding authorization.  2:30pm- [facility] did not have any clinicals on file, CSW faxed pt clinicals, [facility] admissions liaison is speaking with pt at bedside.   3:00pm- Confirmed that pt is able to discharge to [facility] pending [employer] authorization from [facility], they are currently reviewing clinicals. CSW will inform pt, CSW also informed MD and CKD MD that pt will most likely need dialysis here tomorrow before discharge.   4:15pm- Received phone call from [facility] they are requiring updated PT/OT notes- CSW paged MD. Will encourage pt to work with both when they come.   CSW continuing to follow.   Isabel H Chasse, LCSWA [place] Clinical Social Work [phone]   

## 2017-07-14 LAB — BASIC METABOLIC PANEL
Anion gap: 13 (ref 5–15)
BUN: 78 mg/dL — AB (ref 6–20)
CALCIUM: 8.7 mg/dL — AB (ref 8.9–10.3)
CO2: 23 mmol/L (ref 22–32)
CREATININE: 4.47 mg/dL — AB (ref 0.44–1.00)
Chloride: 97 mmol/L — ABNORMAL LOW (ref 101–111)
GFR calc non Af Amer: 9 mL/min — ABNORMAL LOW (ref >60)
GFR, EST AFRICAN AMERICAN: 10 mL/min — AB (ref >60)
Glucose, Bld: 116 mg/dL — ABNORMAL HIGH (ref 65–99)
Potassium: 4.4 mmol/L (ref 3.5–5.1)
Sodium: 133 mmol/L — ABNORMAL LOW (ref 135–145)

## 2017-07-14 LAB — GLUCOSE, CAPILLARY
GLUCOSE-CAPILLARY: 122 mg/dL — AB (ref 65–99)
GLUCOSE-CAPILLARY: 208 mg/dL — AB (ref 65–99)
GLUCOSE-CAPILLARY: 161 mg/dL — AB (ref 65–99)

## 2017-07-14 LAB — CBC
HCT: 33.9 % — ABNORMAL LOW (ref 36.0–46.0)
Hemoglobin: 11 g/dL — ABNORMAL LOW (ref 12.0–15.0)
MCH: 31.5 pg (ref 26.0–34.0)
MCHC: 32.4 g/dL (ref 30.0–36.0)
MCV: 97.1 fL (ref 78.0–100.0)
Platelets: 160 10*3/uL (ref 150–400)
RBC: 3.49 MIL/uL — ABNORMAL LOW (ref 3.87–5.11)
RDW: 15.7 % — ABNORMAL HIGH (ref 11.5–15.5)
WBC: 6.7 10*3/uL (ref 4.0–10.5)

## 2017-07-14 LAB — PROTIME-INR
INR: 2.48
PROTHROMBIN TIME: 26.6 s — AB (ref 11.4–15.2)

## 2017-07-14 MED ORDER — DARBEPOETIN ALFA 60 MCG/0.3ML IJ SOSY
PREFILLED_SYRINGE | INTRAMUSCULAR | Status: AC
  Administered 2017-07-14: 60 ug via INTRAVENOUS
  Filled 2017-07-14: qty 0.3

## 2017-07-14 MED ORDER — ALUM & MAG HYDROXIDE-SIMETH 200-200-20 MG/5ML PO SUSP
15.0000 mL | Freq: Four times a day (QID) | ORAL | Status: DC | PRN
  Administered 2017-07-15: 15 mL via ORAL
  Filled 2017-07-14: qty 30

## 2017-07-14 MED ORDER — MIDODRINE HCL 5 MG PO TABS
ORAL_TABLET | ORAL | Status: AC
  Filled 2017-07-14: qty 1

## 2017-07-14 MED ORDER — NA FERRIC GLUC CPLX IN SUCROSE 12.5 MG/ML IV SOLN
250.0000 mg | Freq: Every day | INTRAVENOUS | Status: AC
  Administered 2017-07-14 – 2017-07-15 (×2): 250 mg via INTRAVENOUS
  Filled 2017-07-14 (×3): qty 20

## 2017-07-14 NOTE — Progress Notes (Signed)
[facility] Progress Note   Assessment/ Plan:    1. 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  Still vol overloaded but gradually improving with HD.  Advanced HF following.  She has had a history of enterococcal endocarditis necessitating removal of defib in 2016 and hasn't been replaced. On Imdur (Coreg stopped).  2.  Afib: on warfarin and amiodarone.  S.p TEE-DCCV 1/18 return to [facility].  3. 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  [facility] has been by to eval fistula- duplex shows adequate size of fistula.  Being cannulated successfully.  CLIP in process. Next HD today 1/22.  Midodrine before HD is helping Bps.  EMLA cream ordered.  Coming to HD in chair. Has spot second shift [facility] TTS- can go there as early as Thursday 1/24- working on discharge to SNF before then   4. Anemia: Hgb 10.3, start ESA with next HD- Aranesp 60 mcg q Tuesday.  WBC dropped to 2.7- ?error? Check today with HD.  Iron stores low , will replete- give dose today and tomorrow is still here  5. CKD-MBD: checking PTH was 461, on calcitriol 0.25 mcg TIW.  Phos 5.2--3.9, no binders yet  6. Nutrition: Albumin 3.6  7. Hypertension: as above in #1  8: Dispo: pending, for SNF- from our standpoint ready for d/c  Subjective:   Soft spoken- did not eat much breakfast, has spot TTS second at [facility]- looking for [place]- if that can happen will be discharged today    Objective:   BP 106/69 (BP Location: Left Arm)   Pulse 62   Temp 98.4 F (36.9 C) (Oral)   Resp 16   Ht 5\' 5"  (1.651 m)   Wt 86.3 kg (190 lb 4.1 oz) Comment: bed  SpO2 96%   BMI 31.66 kg/m   Physical Exam: Gen: older woman, lying in bed, appears to be feeling better HEENT: 4-5 cm JVD, improved CVS: RRR soft systolic murmur Resp: clear anteriorly, no c/w/r, muffled at bases.  Has stopped intermittently coughing Abd: soft nontender NABS Ext: trace LE edema ACCESS: RUE AVF + T/B,  tortuous Labs: BMET Recent Labs  Lab 07/07/17 2214 07/08/17 0653 07/09/17 0635 07/10/17 0659 07/11/17 1358 07/12/17 0808 07/13/17 0422 07/14/17 0455  NA 134* 137 135 135 129* 133* 134* 133*  K 4.5 3.8 4.7 4.5 4.3 4.1 4.2 4.4  CL 98* 102 97* 97* 94* 96* 97* 97*  CO2 23 27 26 27 24 25 25 23   GLUCOSE 216* 86 208* 102* 247* 123* 122* 116*  BUN 103* 51* 33* 44* 53* 38* 65* 78*  CREATININE 2.50* 1.67* 1.82* 2.69* 3.42* 2.91* 3.76* 4.47*  CALCIUM 9.9 8.8* 8.4*  8.3* 8.9 8.4* 8.2* 8.6* 8.7*  PHOS 5.2*  --   --   --  3.5  --   --   --    CBC Recent Labs  Lab 07/07/17 2213 07/08/17 0653 07/09/17 0741 07/11/17 1357  WBC 12.5* 11.1* 10.7* 2.7*  NEUTROABS  --  9.0*  --   --   HGB 12.5 12.4 11.8* 10.3*  HCT 38.4 38.6 38.0 32.3*  MCV 97.0 98.5 100.0 99.4  PLT 216 193 191 165    @IMGRELPRIORS @ Medications:    . amiodarone  200 mg Oral Daily  . calcitRIOL  0.25 mcg Oral Once per day on Tue Thu Sat  . darbepoetin (ARANESP) injection - DIALYSIS  60 mcg Intravenous Q Tue-HD  . feeding supplement (  NEPRO CARB STEADY)  237 mL Oral BID BM  . feeding supplement (PRO-STAT SUGAR FREE 64)  30 mL Oral TID BM  . insulin aspart  0-20 Units Subcutaneous TID WC  . insulin aspart  0-5 Units Subcutaneous QHS  . insulin glargine  5 Units Subcutaneous Daily  . isosorbide mononitrate  30 mg Oral Daily  . levothyroxine  50 mcg Oral QAC breakfast  . multivitamin with minerals  1 tablet Oral Daily  . polyethylene glycol  17 g Oral Daily  . pravastatin  80 mg Oral QHS  . sodium chloride flush  3 mL Intravenous Q12H  . sodium chloride flush  3 mL Intravenous Q12H  . warfarin  2.5 mg Oral q1800  . Warfarin - Pharmacist Dosing Inpatient   Does not apply q1800     Seniyah Esker A  07/14/2017, 8:39 AM

## 2017-07-14 NOTE — Progress Notes (Signed)
Physical Therapy Treatment Patient Details Name: [name] MRN: [id number] DOB: 1945/07/03 Today's Date: 07/14/2017    History of Present Illness [name] is a 72 y.o. female who presented to the ED on 07/02/17 with increasing SOB since d/c on 06/30/17. Found to have COPD exacerbation and possibly worsening CHF. PMH includes nonischemic cardiomyopathy (EF of 20%), A. fib, DM2, CKD stage IV, anemia of CKD, obstructive sleep apnea on CPAP, dyslipidemia, obesity.   PT Comments    Pt progressing with mobility. Increased amb distance with RW and min guard for balance. Continues to have difficulty standing from lower surface heights, requiring modA to do so; performed multiple sit-to-stands from varying seat heights focusing on correct technique and muscle activation. Recommend SNF-level therapies at d/c to maximize functional strength and mobility. Will follow acutely.   Follow Up Recommendations  SNF     Equipment Recommendations  None recommended by PT    Recommendations for Other Services       Precautions / Restrictions Precautions Precautions: Fall Restrictions Weight Bearing Restrictions: No    Mobility  Bed Mobility Overal bed mobility: Needs Assistance Bed Mobility: Supine to Sit     Supine to sit: Mod assist;HOB elevated     General bed mobility comments: ModA to assist BLEs back into bed; pt reliant on use of bed rail to sit  Transfers Overall transfer level: Needs assistance Equipment used: Rolling walker (2 wheeled) Transfers: Sit to/from Stand Sit to Stand: Mod assist;Min assist;From elevated surface         General transfer comment: Pt able to stand on 3rd attempt with ModA to assist trunk elevation when standing from low bed height; cues for hand placement on RW. MinA when standing from raised surface. Focused on technique/therex performing multiple sit-to-stands; unable to perform without BUE support  Ambulation/Gait Ambulation/Gait assistance: Min  guard Ambulation Distance (Feet): 60 Feet Assistive device: Rolling walker (2 wheeled) Gait Pattern/deviations: Step-through pattern;Decreased stride length Gait velocity: Decreased Gait velocity interpretation: <1.8 ft/sec, indicative of risk for recurrent falls General Gait Details: Slow, slightly unsteady amb with RW and min guard for balance. 2x standing rest break secondary to c/o fatigue and SOB   Stairs            Wheelchair Mobility    Modified Rankin (Stroke Patients Only)       Balance Overall balance assessment: Needs assistance Sitting-balance support: Bilateral upper extremity supported;Feet supported;Feet unsupported Sitting balance-Leahy Scale: Fair     Standing balance support: Bilateral upper extremity supported Standing balance-Leahy Scale: Poor                              Cognition Arousal/Alertness: Awake/alert Behavior During Therapy: Flat affect Overall Cognitive Status: Within Functional Limits for tasks assessed                                        Exercises      General Comments        Pertinent Vitals/Pain Pain Assessment: No/denies pain Pain Intervention(s): Premedicated before session;Monitored during session    Home Living                      Prior Function            PT Goals (current goals can now be found in the care  plan section) Acute Rehab PT Goals Patient Stated Goal: Rehab at SNF PT Goal Formulation: With patient Time For Goal Achievement: 07/23/17 Potential to Achieve Goals: Good Progress towards PT goals: Progressing toward goals    Frequency    Min 2X/week      PT Plan Current plan remains appropriate    Co-evaluation              AM-PAC PT "6 Clicks" Daily Activity  Outcome Measure  Difficulty turning over in bed (including adjusting bedclothes, sheets and blankets)?: Unable Difficulty moving from lying on back to sitting on the side of the bed? :  Unable Difficulty sitting down on and standing up from a chair with arms (e.g., wheelchair, bedside commode, etc,.)?: A Little Help needed moving to and from a bed to chair (including a wheelchair)?: A Little Help needed walking in hospital room?: A Little Help needed climbing 3-5 steps with a railing? : A Lot 6 Click Score: 13    End of Session Equipment Utilized During Treatment: Gait belt Activity Tolerance: Patient tolerated treatment well;Patient limited by fatigue Patient left: in bed;with call bell/phone within reach;with bed alarm set Nurse Communication: Mobility status PT Visit Diagnosis: Other abnormalities of gait and mobility (R26.89);Muscle weakness (generalized) (M62.81)     Time: 8127-5170 PT Time Calculation (min) (ACUTE ONLY): 23 min  Charges:  $Gait Training: 8-22 mins $Therapeutic Activity: 8-22 mins                    G Codes:      Mabeline Caras, PT, DPT Acute Rehab Services  Pager: [place] 07/14/2017, 9:06 AM

## 2017-07-14 NOTE — Clinical Social Work Note (Addendum)
CSW faxed today's PT note to [facility] for authorization review. SNF notified.  Dayton Scrape, [place] [phone]  11:21 am CSW received voicemail from [facility] requesting more information on prior level of functioning, wound care, etc. CSW faxed all notes regarding this information for authorization review.  Dayton Scrape, [place]

## 2017-07-14 NOTE — Progress Notes (Signed)
PROGRESS NOTE    [name]  WHQ:[id number] DOB: [birth year] DOA: 07/02/2017 PCP: Lemmie Evens, MD    Brief Narrative:  72 yo female presents with dyspnea and generalized weakness. Patient is known to have a significant medical history of nonischemic cardiomyopathy EF 20%,atrial fibrillation,type 2 diabetes mellitus,stage IV chronic kidney disease.Complained of worsening dyspnea for last 4 days,associated with wheezing and decreased functional physical capacity.On initial physical examination blood pressure 101/78, heart rate 73, oxygen saturation 95% on supplemental oxygen, moist mucous membranes, lungs clear to auscultation bilaterally, heart S1-S2 present irregularly irregular,the abdomen was soft nontender, no lower extremity edema.Sodium 135, potassium 4.4, chloride 99, bicarbonate 17, glucose 29,BUN 109, creatinine 4.11, white count 7.4, hemoglobin 11.1, hematocrit 35.5,platelets242.BNP 1605,EKG left bundle branch block,atrial fibrillation 95 beats per minute,left axis deviation.  Patient was admitted with working diagnosis of COPD exacerbation, complicated with acute on chronic systolic heart failure.  Assessment & Plan:   Principal Problem:   COPD (chronic obstructive pulmonary disease) (HCC) Active Problems:   Dyslipidemia   HTN (hypertension)   History of pulmonary embolism   Type 2 diabetes, uncontrolled, with renal manifestation (HCC)   Chronic renal disease, stage 4, severely decreased glomerular filtration rate (GFR) between 15-29 mL/min/1.73 square meter (HCC)   Chronic anticoagulation   Atrial fibrillation (HCC)   Implantable cardioverter-defibrillator-CRT- Mdt   Anemia in chronic renal disease   Hypothyroidism   SOB (shortness of breath)   Acute on chronic systolic CHF (congestive heart failure), NYHA class 4 (HCC)   Palliative care by specialist   Goals of care, counseling/discussion   Generalized weakness   Nonischemic cardiomyopathy ([place])  Hyperglycemia   Dependence on renal dialysis ([place])   Leukocytosis   Acute blood loss anemia   1. Acute on chronic systolic heart failure decompensation. Patient was admitted to the medical ward, placed on a remote telemetry monitor, diuresis was started with IV furosemide, no significant improvement of her symptoms. Patient was noted to be uremic, decision was made to place patient on renal replacement therapy. Patient has successful ultrafiltration with improving volume status. Patient did not tolerate beta blockade or ACE inhibitor due to risk of hypotension. Currently on isosorbide.   2. COPD exacerbation. Patient was placed on supplemental oxygen per nasal cannula, she received bronchodilator therapy, systemic steroids. With improvement of her symptoms. Currently she is off steroids. Her oximetry saturation is 96% on room air.   3. Acute kidney injury chronic disease stage V. Patient was found to be uremic, nephrology was consulted, she underwent renal replacement therapy. Patient had a AV fistula in place. Was evaluated with duplex, and was successfully cannulated. She tolerated well hemodialysis with significant improvement of her symptoms. Patient received midodrine before hemodialysis to prevent hypotension.   4. Paroxysmal atrial fibrillation. It was considered that the arrhythmia was concrete into her symptoms, she underwent transesophageal echocardiography and electrical cardioversion, successfully returning to sinus rhythm. Patient has been placed on amiodarone, anticoagulation with warfarin, discharge INR 2.48.  5. Type 2 diabetes mellitus. Patient was placed on insulin sliding-scale for glucose coverage and monitoring, she received basal insulin therapy with Levemir, 5 units.   6. Hypothyroidism. She was continued on levothyroxine.  7. Dyslipidemia. Continue pravastatin.    DVT prophylaxis: warfarin  Code Status: full Family Communication: I spoke with patient's family at  the bedside and all questions were addressed Disposition Plan: snf   Consultants:   Cardiology  Nephrology  Procedures:   Cardioversion  Antimicrobials:       Subjective:  Patient feeling better, dyspnea has improved, feeling weak and deconditioned, no nausea or vomiting.     Objective: Vitals:   07/14/17 1330 07/14/17 1400 07/14/17 1430 07/14/17 1500  BP: (!) 98/48 (!) 93/52 (!) 92/48 (!) 94/48  Pulse: 73 74 75 75  Resp:      Temp:      TempSrc:      SpO2:      Weight:      Height:        Intake/Output Summary (Last 24 hours) at 07/14/2017 1526 Last data filed at 07/14/2017 0859 Gross per 24 hour  Intake 720 ml  Output 450 ml  Net 270 ml   Filed Weights   07/12/17 0627 07/13/17 0535 07/14/17 0646  Weight: 85.7 kg (188 lb 15 oz) 87 kg (191 lb 12.8 oz) 86.3 kg (190 lb 4.1 oz)    Examination:   General: Not in pain or dyspnea Neurology: Awake and alert, non focal  E ENT: mild pallor, no icterus, oral mucosa moist Cardiovascular: No JVD. S1-S2 present, rhythmic, no gallops, rubs, or murmurs. No lower extremity edema. Pulmonary: vesicular breath sounds bilaterally, adequate air movement, no wheezing, rhonchi or rales. Mild decreased breath sounds at bases.  Gastrointestinal. Abdomen protuberant, no organomegaly, non tender, no rebound or guarding Skin. No rashes Musculoskeletal: no joint deformities  Data Reviewed: I have personally reviewed following labs and imaging studies  CBC: Recent Labs  Lab 07/07/17 2213 07/08/17 0653 07/09/17 0741 07/11/17 1357  WBC 12.5* 11.1* 10.7* 2.7*  NEUTROABS  --  9.0*  --   --   HGB 12.5 12.4 11.8* 10.3*  HCT 38.4 38.6 38.0 32.3*  MCV 97.0 98.5 100.0 99.4  PLT 216 193 191 409   Basic Metabolic Panel: Recent Labs  Lab 07/07/17 2214  07/10/17 0659 07/11/17 1358 07/12/17 0808 07/13/17 0422 07/14/17 0455  NA 134*   < > 135 129* 133* 134* 133*  K 4.5   < > 4.5 4.3 4.1 4.2 4.4  CL 98*   < > 97* 94* 96* 97* 97*   CO2 23   < > 27 24 25 25 23   GLUCOSE 216*   < > 102* 247* 123* 122* 116*  BUN 103*   < > 44* 53* 38* 65* 78*  CREATININE 2.50*   < > 2.69* 3.42* 2.91* 3.76* 4.47*  CALCIUM 9.9   < > 8.9 8.4* 8.2* 8.6* 8.7*  PHOS 5.2*  --   --  3.5  --   --   --    < > = values in this interval not displayed.   GFR: Estimated Creatinine Clearance: 12.2 mL/min (A) (by C-G formula based on SCr of 4.47 mg/dL (H)). Liver Function Tests: Recent Labs  Lab 07/07/17 2214 07/11/17 1358  ALBUMIN 3.1* 2.6*   No results for input(s): LIPASE, AMYLASE in the last 168 hours. No results for input(s): AMMONIA in the last 168 hours. Coagulation Profile: Recent Labs  Lab 07/10/17 0659 07/11/17 0845 07/12/17 0808 07/13/17 0422 07/14/17 0455  INR 2.63 2.85 2.27 2.21 2.48   Cardiac Enzymes: No results for input(s): CKTOTAL, CKMB, CKMBINDEX, TROPONINI in the last 168 hours. BNP (last 3 results) No results for input(s): PROBNP in the last 8760 hours. HbA1C: No results for input(s): HGBA1C in the last 72 hours. CBG: Recent Labs  Lab 07/13/17 1111 07/13/17 1627 07/13/17 2128 07/14/17 0746 07/14/17 1057  GLUCAP 170* 155* 142* 122* 208*   Lipid Profile: No results for input(s): CHOL, HDL, LDLCALC,  TRIG, CHOLHDL, LDLDIRECT in the last 72 hours. Thyroid Function Tests: No results for input(s): TSH, T4TOTAL, FREET4, T3FREE, THYROIDAB in the last 72 hours. Anemia Panel: Recent Labs    07/13/17 0422  FERRITIN 185  TIBC 202*  IRON 40      Radiology Studies: I have reviewed all of the imaging during this hospital visit personally     Scheduled Meds: . amiodarone  200 mg Oral Daily  . calcitRIOL  0.25 mcg Oral Once per day on Tue Thu Sat  . darbepoetin (ARANESP) injection - DIALYSIS  60 mcg Intravenous Q Tue-HD  . feeding supplement (NEPRO CARB STEADY)  237 mL Oral BID BM  . feeding supplement (PRO-STAT SUGAR FREE 64)  30 mL Oral TID BM  . insulin aspart  0-20 Units Subcutaneous TID WC  .  insulin aspart  0-5 Units Subcutaneous QHS  . insulin glargine  5 Units Subcutaneous Daily  . isosorbide mononitrate  30 mg Oral Daily  . levothyroxine  50 mcg Oral QAC breakfast  . midodrine      . multivitamin with minerals  1 tablet Oral Daily  . polyethylene glycol  17 g Oral Daily  . pravastatin  80 mg Oral QHS  . sodium chloride flush  3 mL Intravenous Q12H  . sodium chloride flush  3 mL Intravenous Q12H  . warfarin  2.5 mg Oral q1800  . Warfarin - Pharmacist Dosing Inpatient   Does not apply q1800   Continuous Infusions: . sodium chloride    . sodium chloride    . ferric gluconate (FERRLECIT/NULECIT) IV       LOS: 12 days        Media Tina Gerome Apley, MD [facility] Pager [phone]

## 2017-07-14 NOTE — Progress Notes (Signed)
ANTICOAGULATION CONSULT NOTE - Follow Up Consult  Pharmacy Consult for warfarin Indication: atrial fibrillation and VTE prophylaxis  No Known Allergies  Patient Measurements: Height: 5\' 5"  (165.1 cm) Weight: 190 lb 4.1 oz (86.3 kg)(bed) IBW/kg (Calculated) : 57   Vital Signs: Temp: 98.2 F (36.8 C) (01/22 1059) Temp Source: Oral (01/22 1059) BP: 108/53 (01/22 1059) Pulse Rate: 70 (01/22 1059)  Labs: Recent Labs    07/11/17 1357  07/12/17 0808 07/13/17 0422 07/14/17 0455  HGB 10.3*  --   --   --   --   HCT 32.3*  --   --   --   --   PLT 165  --   --   --   --   LABPROT  --   --  24.9* 24.3* 26.6*  INR  --   --  2.27 2.21 2.48  CREATININE  --    < > 2.91* 3.76* 4.47*   < > = values in this interval not displayed.    Estimated Creatinine Clearance: 12.2 mL/min (A) (by C-G formula based on SCr of 4.47 mg/dL (H)).  Medications:  Scheduled:  . amiodarone  200 mg Oral Daily  . calcitRIOL  0.25 mcg Oral Once per day on Tue Thu Sat  . darbepoetin (ARANESP) injection - DIALYSIS  60 mcg Intravenous Q Tue-HD  . feeding supplement (NEPRO CARB STEADY)  237 mL Oral BID BM  . feeding supplement (PRO-STAT SUGAR FREE 64)  30 mL Oral TID BM  . insulin aspart  0-20 Units Subcutaneous TID WC  . insulin aspart  0-5 Units Subcutaneous QHS  . insulin glargine  5 Units Subcutaneous Daily  . isosorbide mononitrate  30 mg Oral Daily  . levothyroxine  50 mcg Oral QAC breakfast  . multivitamin with minerals  1 tablet Oral Daily  . polyethylene glycol  17 g Oral Daily  . pravastatin  80 mg Oral QHS  . sodium chloride flush  3 mL Intravenous Q12H  . sodium chloride flush  3 mL Intravenous Q12H  . warfarin  2.5 mg Oral q1800  . Warfarin - Pharmacist Dosing Inpatient   Does not apply q1800    Assessment: 72 y.o female presented with SOB and recent admit on 1/4 for CHF exacerbation.  On warfarin for afib prior to admission with therapeutic INR 3 upon admission.   Home regimen: 1.25 mg on  MWF and 2.5 mg all other days (weekly dose 13.75mg ).  INR previously labile -  Now remains therapeutic.  INR 2.4 s/p TEE/DCCV Afib > SR 1/18 - will aim to keep INR > 2. Amiodarone dose slightly higher than home dose.  No overt bleeding or complications noted. CBC stable.  Goal of Therapy:  INR 2-3 Monitor platelets by anticoagulation protocol: Yes   Plan:  Warfarin 2.5mg  PO daily  - continue for now and adjust as needed outpatient Monitor daily INR, CBC, signs/symptoms of bleeding  Bonnita Nasuti Pharm.D. CPP, BCPS Clinical Pharmacist [phone] 07/14/2017 11:41 AM

## 2017-07-14 NOTE — Progress Notes (Signed)
Patient ID: [name], female   DOB: Aug 01, 1945, 72 y.o.   MRN: [id number]     Advanced Heart Failure Rounding Note  Primary Cardiologist: Aundra Dubin  Subjective:    S/p DCCV 07/10/17. Maintaining NSR  Feeling OK this am. Was supposed to d/c yesterday but awaiting Insurance approval for SNF. Denies SOB or CP.   Objective:   Weight Range: 190 lb 4.1 oz (86.3 kg) Body mass index is 31.66 kg/m.   Vital Signs:   Temp:  [98.4 F (36.9 C)-98.6 F (37 C)] 98.4 F (36.9 C) (01/21 2013) Pulse Rate:  [62-69] 62 (01/22 0646) Resp:  [16-18] 16 (01/21 2013) BP: (106-111)/(48-69) 106/69 (01/22 0646) SpO2:  [96 %-98 %] 96 % (01/22 0646) Weight:  [190 lb 4.1 oz (86.3 kg)] 190 lb 4.1 oz (86.3 kg) (01/22 0646) Last BM Date: 07/11/17  Weight change: Filed Weights   07/12/17 0627 07/13/17 0535 07/14/17 0646  Weight: 188 lb 15 oz (85.7 kg) 191 lb 12.8 oz (87 kg) 190 lb 4.1 oz (86.3 kg)   Intake/Output:   Intake/Output Summary (Last 24 hours) at 07/14/2017 0748 Last data filed at 07/14/2017 0646 Gross per 24 hour  Intake 840 ml  Output 450 ml  Net 390 ml    Physical Exam   General: Elderly appearing.  HEENT: Normal Neck: Supple. JVP 6-7. Carotids 2+ bilat; no bruits. No thyromegaly or nodule noted. Cor: PMI nondisplaced. RRR, No M/G/R noted Lungs: CTAB, normal effort. Abdomen: Soft, non-tender, non-distended, no HSM. No bruits or masses. +BS  Extremities: No cyanosis, clubbing, or rash. RUE AVF. No edema.   Neuro: Alert & orientedx3, cranial nerves grossly intact. moves all 4 extremities w/o difficulty. Affect pleasant   Telemetry   NSR 60s, personally reviewed.   EKG    No new tracings.       Labs    CBC Recent Labs    07/11/17 1357  WBC 2.7*  HGB 10.3*  HCT 32.3*  MCV 99.4  PLT 176   Basic Metabolic Panel Recent Labs    07/11/17 1358  07/13/17 0422 07/14/17 0455  NA 129*   < > 134* 133*  K 4.3   < > 4.2 4.4  CL 94*   < > 97* 97*  CO2 24   < > 25 23    GLUCOSE 247*   < > 122* 116*  BUN 53*   < > 65* 78*  CREATININE 3.42*   < > 3.76* 4.47*  CALCIUM 8.4*   < > 8.6* 8.7*  PHOS 3.5  --   --   --    < > = values in this interval not displayed.   Liver Function Tests Recent Labs    07/11/17 1358  ALBUMIN 2.6*   No results for input(s): LIPASE, AMYLASE in the last 72 hours. Cardiac Enzymes No results for input(s): CKTOTAL, CKMB, CKMBINDEX, TROPONINI in the last 72 hours.  BNP: BNP (last 3 results) Recent Labs    06/26/17 0806 07/02/17 1131 07/03/17 0501  BNP 2,226.0* 1,605.1* 2,179.2*    ProBNP (last 3 results) No results for input(s): PROBNP in the last 8760 hours.   D-Dimer No results for input(s): DDIMER in the last 72 hours. Hemoglobin A1C No results for input(s): HGBA1C in the last 72 hours. Fasting Lipid Panel No results for input(s): CHOL, HDL, LDLCALC, TRIG, CHOLHDL, LDLDIRECT in the last 72 hours. Thyroid Function Tests No results for input(s): TSH, T4TOTAL, T3FREE, THYROIDAB in the last 72 hours.  Invalid  input(s): FREET3  Other results:   Imaging    No results found.   Medications:     Scheduled Medications: . amiodarone  200 mg Oral Daily  . calcitRIOL  0.25 mcg Oral Once per day on Tue Thu Sat  . darbepoetin (ARANESP) injection - DIALYSIS  60 mcg Intravenous Q Tue-HD  . feeding supplement (NEPRO CARB STEADY)  237 mL Oral BID BM  . feeding supplement (PRO-STAT SUGAR FREE 64)  30 mL Oral TID BM  . insulin aspart  0-20 Units Subcutaneous TID WC  . insulin aspart  0-5 Units Subcutaneous QHS  . insulin glargine  5 Units Subcutaneous Daily  . isosorbide mononitrate  30 mg Oral Daily  . levothyroxine  50 mcg Oral QAC breakfast  . multivitamin with minerals  1 tablet Oral Daily  . polyethylene glycol  17 g Oral Daily  . pravastatin  80 mg Oral QHS  . sodium chloride flush  3 mL Intravenous Q12H  . sodium chloride flush  3 mL Intravenous Q12H  . warfarin  2.5 mg Oral q1800  . Warfarin -  Pharmacist Dosing Inpatient   Does not apply q1800    Infusions: . sodium chloride    . sodium chloride      PRN Medications: sodium chloride, acetaminophen **OR** acetaminophen, albuterol, guaiFENesin-dextromethorphan, lidocaine-prilocaine, midodrine, ondansetron **OR** ondansetron (ZOFRAN) IV, oxyCODONE-acetaminophen **AND** oxyCODONE, senna-docusate, sodium chloride flush, sodium chloride flush, zolpidem    Patient Profile   72 yo with history of chronic systolic CHF from nonischemic cardiomyopathy, prior PE, CKD IV-V, and paroxysmal atrial fibrillation presented with dyspnea and progressive renal dysfunction.   Assessment/Plan   1. Acute on chronic systolic CHF: Echo (4/85) with EF 20-25%, nonischemic cardiomyopathy.  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   - Volume status stable with dialysis.   - Suspect the majority of her dyspnea is due to CHF, COPD probably plays a lesser role.  - Coreg stopped with hypotension - Has required midodrine on HD days, so no room to add afterload reduction at this time.  - Tolerating HD thus far.  - Continue imdur 30 mg daily. Can consider hydralazine as needed/tolerated.  - No ACE/ARB/ARNI with ESRD. - Coreg stopped 1/14 due to low BP.  2. 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  - new this admission on HD, but had AVF in place - Nephrology following.  Dialyzing on T/Th/Sat schedule. No change.  3. Atrial fibrillation: Persistent.  Was in NSR in 9/18 but has been in atrial fibrillation since admitted this time.  - Remains in NSR s/p DCCV 07/10/17 - Continue amiodarone 200 mg daily.  - Continue warfarin, INR 2.48 this am. PCP manages INR as outpatient. Stable.  4. OSA:  - Continue nightly CPAP. No change.  5. COPD:  - Per primary. Stable. 6. Deconditioning: - PT/OT recommending SNF. Possibly today.   HF meds for discharge - Amiodarone 200 mg daily - IMdur 30 mg daily - Warfarin.   Outpatient dosing per PCP. Will need follow up or management by SNF. Per primary.  - Pravastatin 80 mg daily  HF follow up set up for 07/22/17.  Length of Stay: [address]  [name]  07/14/2017, 7:48 AM  Advanced Heart Failure Team Pager [phone] (M-F; 7a - 4p)  Please contact [facility] for night-coverage after hours (4p -7a ) and weekends on [email or web address]  Patient seen with PA, agree with the  above note.  She remains stable in NSR on amiodarone.  Will be going to SNF.  HF followup made.   Loralie Champagne 07/14/2017 8:46 AM

## 2017-07-14 NOTE — Procedures (Signed)
Patient was seen on dialysis and the procedure was supervised.  BFR 300  Via AVF BP is  105/50.   Patient appears to be tolerating treatment well  Batoul Limes A 07/14/2017

## 2017-07-15 DIAGNOSIS — N189 Chronic kidney disease, unspecified: Secondary | ICD-10-CM

## 2017-07-15 LAB — PROTIME-INR
INR: 2.67
Prothrombin Time: 28.2 seconds — ABNORMAL HIGH (ref 11.4–15.2)

## 2017-07-15 LAB — GLUCOSE, CAPILLARY
Glucose-Capillary: 120 mg/dL — ABNORMAL HIGH (ref 65–99)
Glucose-Capillary: 169 mg/dL — ABNORMAL HIGH (ref 65–99)

## 2017-07-15 MED ORDER — CALCIUM CARBONATE ANTACID 1250 MG/5ML PO SUSP
500.0000 mg | Freq: Four times a day (QID) | ORAL | Status: DC | PRN
  Administered 2017-07-15: 500 mg via ORAL
  Filled 2017-07-15 (×2): qty 5

## 2017-07-15 MED ORDER — ONDANSETRON HCL 4 MG PO TABS: 4.0000 mg | ORAL_TABLET | Freq: Three times a day (TID) | ORAL | Status: AC | PRN

## 2017-07-15 MED ORDER — MIDODRINE HCL 5 MG PO TABS: 5.0000 mg | ORAL_TABLET | ORAL | Status: AC | PRN

## 2017-07-15 MED ORDER — AMIODARONE HCL 200 MG PO TABS: 200.0000 mg | ORAL_TABLET | Freq: Every day | ORAL | Status: DC

## 2017-07-15 MED ORDER — GLIPIZIDE 5 MG PO TABS: 2.5000 mg | ORAL_TABLET | Freq: Every day | ORAL | Status: AC

## 2017-07-15 MED ORDER — WARFARIN 1.25 MG HALF TABLET
1.2500 mg | ORAL_TABLET | ORAL | Status: DC
  Filled 2017-07-15: qty 1

## 2017-07-15 MED ORDER — WARFARIN SODIUM 2.5 MG PO TABS: 2.5000 mg | ORAL_TABLET | ORAL | Status: DC

## 2017-07-15 MED ORDER — PRO-STAT SUGAR FREE PO LIQD: 30.0000 mL | Freq: Three times a day (TID) | ORAL | Status: DC

## 2017-07-15 MED ORDER — OXYCODONE-ACETAMINOPHEN 10-325 MG PO TABS: 1.0000 | ORAL_TABLET | Freq: Four times a day (QID) | ORAL | 0 refills | Status: AC | PRN

## 2017-07-15 NOTE — Progress Notes (Signed)
Pt discharged via EMS, AVS provided, Rx for pain medication provided (confirmed in packet to EMS).  pts husband aware of discharge, present for discharge.   IV d/c without complications. Pt denies complaints upon departure.

## 2017-07-15 NOTE — Discharge Summary (Signed)
Physician Discharge Summary  [name]:[id number] DOB: 02/18/1946 DOA: 07/02/2017  PCP: Lemmie Evens, MD  Admit date: 07/02/2017 Discharge date: 07/15/2017  Admitted From: Home Disposition: SNF  Recommendations for Outpatient Follow-up:  1. Follow up with PCP in 1 week 2. Lisinopril, Coreg discontinued secondary to hypotension 3. Midodrine started for hypotension in setting of hemodialysis. 4. Please obtain BMP/CBC in one week 5. Please follow up on the following pending results: None  Home Health: SNF Equipment/Devices: SNF  Discharge Condition: Stable CODE STATUS: Full code Diet recommendation: Heart healthy/carb modified/renal diet   Brief/Interim Summary:  Admission HPI written by Lady Deutscher, MD   Chief Complaint: Increasing shortness of breath and weakness since discharge from the hospital.  HPI: [name] is a 72 y.o. female with medical history significant of nonischemic cardiomyopathy with EF of 20% as per ECHO 1/5 2019, A. fib on Coumadin, type2diabetes mellitus with chronic kidney disease stage IV,anemia of chronic kidney disease, obstructive sleep apnea on CPAP, dyslipidemia and obesity presented to the ED with increasing shortness of breath since discharge from the hospital on June 30, 2017.  Was admitted on January 4 for acute on chronic combined systolic and diastolic congestive heart failure along with a UTI diagnosed by her outpatient physician repeat UA in the hospital was negative.  Echo has plantable cardio defibrillator paroxysmal atrial fibrillation obstructive sleep apnea and obesity.  Since discharge from the hospital she has done poorly according to her son and her husband.  She presented to our ER with shortness of breath but no obvious increased weight.  She cannot get up and move around and she was noted to have some wheezing.  In the emergency department she was given a nebulizer treatment and seemed to improve some with a dose of  steroids as well.  Of note however is that she has an extremely poor EF of 20-25% and presented without fever, white blood cell count elevation, she did have a little bit of cough but no significant sputum production.  Her biggest complaint is shortness of breath and no energy.   ED Course: He was given nebulizers and steroids with minimal improvement of her symptoms she will be admitted to the hospital for further evaluation and management.   Hospital course:  1.Acute on chronic systolic heart failure decompensation. Patient was admitted to the medical ward, placed on a remote telemetry monitor, diuresis was started with IV furosemide,no significant improvement of her symptoms.Patient was noted to be uremic,decision was made to place patient on renal replacement therapy.Patient has successful ultrafiltration with improving volume status.Patient did not tolerate beta blockade or ACE inhibitor due to risk of hypotension.Currently on isosorbide.  Outpatient heart failure follow-up.  2.COPD exacerbation. Patient was placed on supplemental oxygen per nasal cannula, she received bronchodilator therapy,systemic steroids.With improvement of her symptoms.Currently she is off steroids.Her oximetry saturation is 96% on room air.   3.Acute kidney injury chronic disease stage V.Patient was found to be uremic,nephrology was consulted,she underwent renal replacement therapy.Patient had a AV fistula in place.Was evaluated with duplex,and was successfully cannulated.She tolerated well hemodialysis with significant improvement of her symptoms.Patient received midodrine before hemodialysis to prevent hypotension.   4.Paroxysmal atrial fibrillation.It was considered that the arrhythmia was concrete into her symptoms,she underwent transesophageal echocardiography and electrical cardioversion,successfully returning to sinus rhythm.Patient has been placed on amiodarone,anticoagulation with  warfarin,discharge INR 2.67.  5.Type 2 diabetes mellitus.Patient was placed on insulin sliding-scale for glucose coverage and monitoring, she received basal insulin therapy with  Levemir, 5 units. Discharge on Glipizide 2.5 mg daily.  6.Hypothyroidism. She was continued on levothyroxine.  7.Dyslipidemia. Continuepravastatin.    Discharge Diagnoses:  Principal Problem:   COPD (chronic obstructive pulmonary disease) (HCC) Active Problems:   Dyslipidemia   HTN (hypertension)   History of pulmonary embolism   Type 2 diabetes, uncontrolled, with renal manifestation (HCC)   Chronic renal disease, stage 4, severely decreased glomerular filtration rate (GFR) between 15-29 mL/min/1.73 square meter (HCC)   Chronic anticoagulation   Atrial fibrillation (HCC)   Implantable cardioverter-defibrillator-CRT- Mdt   Anemia in chronic renal disease   Hypothyroidism   SOB (shortness of breath)   Acute on chronic systolic CHF (congestive heart failure), NYHA class 4 (HCC)   Palliative care by specialist   Goals of care, counseling/discussion   Generalized weakness   Nonischemic cardiomyopathy ([place])   Hyperglycemia   Dependence on renal dialysis ([place])   Leukocytosis   Acute blood loss anemia    Discharge Instructions   Allergies as of 07/15/2017   No Known Allergies     Medication List    STOP taking these medications   carvedilol 6.25 MG tablet Commonly known as:  COREG   colchicine 0.6 MG tablet   ferrous sulfate 324 (65 Fe) MG Tbec   furosemide 40 MG tablet Commonly known as:  LASIX   glimepiride 2 MG tablet Commonly known as:  AMARYL   guaiFENesin 600 MG 12 hr tablet Commonly known as:  MUCINEX   lisinopril 10 MG tablet Commonly known as:  PRINIVIL,ZESTRIL   metoCLOPramide 5 MG tablet Commonly known as:  REGLAN   spironolactone 25 MG tablet Commonly known as:  ALDACTONE     TAKE these medications   amiodarone 200 MG tablet Commonly known as:   PACERONE Take 1 tablet (200 mg total) by mouth daily. What changed:  how much to take   calcitRIOL 0.25 MCG capsule Commonly known as:  ROCALTROL Take 0.25 mcg by mouth daily.   feeding supplement (ENSURE ENLIVE) Liqd Take 237 mLs by mouth 2 (two) times daily between meals. What changed:  when to take this   feeding supplement (PRO-STAT SUGAR FREE 64) Liqd Take 30 mLs by mouth 3 (three) times daily between meals.   glipiZIDE 5 MG tablet Commonly known as:  GLUCOTROL Take 0.5 tablets (2.5 mg total) by mouth daily before breakfast.   guaiFENesin-dextromethorphan 100-10 MG/5ML syrup Commonly known as:  ROBITUSSIN DM Take 5 mLs by mouth every 4 (four) hours as needed for cough.   isosorbide mononitrate 30 MG 24 hr tablet Commonly known as:  IMDUR Take 1 tablet (30 mg total) by mouth daily.   levothyroxine 50 MCG tablet Commonly known as:  SYNTHROID, LEVOTHROID Take 50 mcg by mouth daily before breakfast.   midodrine 5 MG tablet Commonly known as:  PROAMATINE Take 1 tablet (5 mg total) by mouth every dialysis (low BP.  Please give 30 min prior to HD).   ondansetron 4 MG tablet Commonly known as:  ZOFRAN Take 1 tablet (4 mg total) by mouth every 8 (eight) hours as needed for nausea or vomiting. Reported on 11/06/2015 What changed:  when to take this   oxyCODONE-acetaminophen 10-325 MG tablet Commonly known as:  PERCOCET Take 1 tablet by mouth every 6 (six) hours as needed for pain.   polyethylene glycol powder powder Commonly known as:  GLYCOLAX/MIRALAX Take 17 g by mouth daily.   pravastatin 40 MG tablet Commonly known as:  PRAVACHOL Take 80 mg by mouth  at bedtime.   senna-docusate 8.6-50 MG tablet Commonly known as:  Senokot-S Take 2 tablets by mouth daily as needed for mild constipation.   warfarin 2.5 MG tablet Commonly known as:  COUMADIN Take 0.5-1 tablets (1.25-2.5 mg total) by mouth daily at 6 PM. Takes 1 tablet on Mon, Wed, Fri, take 0.5 tablet on all other  days What changed:  additional instructions       Contact information for follow-up providers    [place]. Go on 07/22/2017.   Specialty:  Cardiology Why:  1:30 PM, [facility], parking code [id number] Contact information: [address] [id number] [place] information for after-discharge care    Destination    [facility] .   Service:  Skilled Nursing Contact information: [address] [place] [phone]                 No Known Allergies  Consultations:  Cardiology   Procedures/Studies: Dg Chest 2 View  Result Date: 07/02/2017 CLINICAL DATA:  Productive cough and shortness of breath for the past week. EXAM: CHEST  2 VIEW COMPARISON:  Chest x-ray dated June 26, 2017. FINDINGS: Stable mild cardiomegaly. Normal pulmonary vascularity. Bibasilar scarring. No focal consolidation, pleural effusion, or pneumothorax. No acute osseous abnormality. IMPRESSION: Stable cardiomegaly and bibasilar scarring. No active cardiopulmonary disease. Electronically Signed   By: Titus Dubin M.D.   On: 07/02/2017 12:42   Dg Chest Portable 1 View  Result Date: 06/26/2017 CLINICAL DATA:  Shortness of breath. EXAM: PORTABLE CHEST 1 VIEW COMPARISON:  02/23/2015 FINDINGS: Midline trachea. Cardiomegaly accentuated by AP portable technique. No definite pleural fluid. No pneumothorax. Low lung volumes with resultant pulmonary interstitial prominence. Scarring at the left greater than right lung bases. No lobar consolidation. Advanced degenerative changes of both glenohumeral joints. IMPRESSION: Cardiomegaly and low lung volumes.  No acute findings. Electronically Signed   By: Abigail Miyamoto M.D.   On: 06/26/2017 08:29     Transthoracic Echocardiogram (06/27/2017) Study Conclusions  - Left ventricle: The cavity size was  normal. Wall thickness was   increased in a pattern of mild LVH. Systolic function was   severely reduced. The estimated ejection fraction was in the   range of 20% to 25%. Diffuse hypokinesis. The study is not   technically sufficient to allow evaluation of LV diastolic   function. - Aortic valve: Moderately calcified annulus. Trileaflet;   moderately thickened leaflets. There was mild regurgitation.   Valve area (VTI): 1.56 cm^2. Valve area (Vmax): 1.66 cm^2. - Mitral valve: Mildly calcified annulus. Mildly thickened leaflets   . There was mild regurgitation. - Left atrium: The atrium was severely dilated. - Right ventricle: The cavity size was mildly to moderately   dilated. - Right atrium: The atrium was moderately dilated.   Subjective: Some indigestion today. Otherwise, no dyspnea or chest pain.  Discharge Exam: Vitals:   07/14/17 2024 07/15/17 0638  BP: (!) 110/46 (!) 97/51  Pulse: 72 65  Resp: 16   Temp: 98.3 F (36.8 C) 97.9 F (36.6 C)  SpO2: 99% 97%   Vitals:   07/14/17 1700 07/14/17 1714 07/14/17 2024 07/15/17 0638  BP: (!) 94/47 (!) 99/58 (!) 110/46 (!) 97/51  Pulse: 76 75 72 65  Resp:  15 16   Temp:  97.9 F (36.6 C) 98.3 F (36.8 C) 97.9 F (  36.6 C)  TempSrc:  Oral Oral Oral  SpO2:  98% 99% 97%  Weight:    85.7 kg (188 lb 15 oz)  Height:        General: Pt is alert, awake, not in acute distress Cardiovascular: RRR, S1/S2 +, no rubs, no gallops Respiratory: CTA bilaterally, no wheezing, no rhonchi Abdominal: Soft, NT, ND, bowel sounds + Extremities: no edema, no cyanosis    The results of significant diagnostics from this hospitalization (including imaging, microbiology, ancillary and laboratory) are listed below for reference.     Microbiology: No results found for this or any previous visit (from the past 240 hour(s)).   Labs: BNP (last 3 results) Recent Labs    06/26/17 0806 07/02/17 1131 07/03/17 0501  BNP 2,226.0* 1,605.1* 2,179.2*    Basic Metabolic Panel: Recent Labs  Lab 07/10/17 0659 07/11/17 1358 07/12/17 0808 07/13/17 0422 07/14/17 0455  NA 135 129* 133* 134* 133*  K 4.5 4.3 4.1 4.2 4.4  CL 97* 94* 96* 97* 97*  CO2 27 24 25 25 23   GLUCOSE 102* 247* 123* 122* 116*  BUN 44* 53* 38* 65* 78*  CREATININE 2.69* 3.42* 2.91* 3.76* 4.47*  CALCIUM 8.9 8.4* 8.2* 8.6* 8.7*  PHOS  --  3.5  --   --   --    Liver Function Tests: Recent Labs  Lab 07/11/17 1358  ALBUMIN 2.6*   No results for input(s): LIPASE, AMYLASE in the last 168 hours. No results for input(s): AMMONIA in the last 168 hours. CBC: Recent Labs  Lab 07/09/17 0741 07/11/17 1357 07/14/17 1300  WBC 10.7* 2.7* 6.7  HGB 11.8* 10.3* 11.0*  HCT 38.0 32.3* 33.9*  MCV 100.0 99.4 97.1  PLT 191 165 160   CBG: Recent Labs  Lab 07/13/17 2128 07/14/17 0746 07/14/17 1057 07/14/17 2111 07/15/17 0749  GLUCAP 142* 122* 208* 161* 120*    Anemia work up Recent Labs    07/13/17 0422  FERRITIN 185  TIBC 202*  IRON 40   Urinalysis    Component Value Date/Time   COLORURINE YELLOW 07/02/2017 1123   APPEARANCEUR HAZY (A) 07/02/2017 1123   LABSPEC 1.013 07/02/2017 1123   PHURINE 5.0 07/02/2017 1123   GLUCOSEU NEGATIVE 07/02/2017 1123   HGBUR LARGE (A) 07/02/2017 1123   BILIRUBINUR NEGATIVE 07/02/2017 1123   KETONESUR NEGATIVE 07/02/2017 [place] 07/02/2017 1123   UROBILINOGEN 0.2 01/08/2015 0940   NITRITE NEGATIVE 07/02/2017 1123   LEUKOCYTESUR NEGATIVE 07/02/2017 1123    Time coordinating discharge: Over 30 minutes  SIGNED:   Cordelia Poche, MD [facility] 07/15/2017, 9:50 AM Pager [phone]  If 7PM-7AM, please contact night-coverage [email or web address] Password [email or web address]

## 2017-07-15 NOTE — Progress Notes (Signed)
I see plans for discharge to SNF- is OK by renal - has been set up to get HD at outpatient [facility] on Thursday   [name] A

## 2017-07-15 NOTE — Clinical Social Work Placement (Signed)
   CLINICAL SOCIAL WORK PLACEMENT  NOTE  Date:  07/15/2017  Patient Details  Name: [name] MRN: [id number] Date of Birth: 1945/12/07  Clinical Social Work is seeking post-discharge placement for this patient at the [place] level of care (*CSW will initial, date and re-position this form in  chart as items are completed):  Yes   Patient/family provided with [place] Work Department's list of facilities offering this level of care within the geographic area requested by the patient (or if unable, by the patient's family).  Yes   Patient/family informed of their freedom to choose among providers that offer the needed level of care, that participate in Medicare, Medicaid or managed care program needed by the patient, have an available bed and are willing to accept the patient.  Yes   Patient/family informed of [facility]'s ownership interest in [facility] and [facility], as well as of the fact that they are under no obligation to receive care at these facilities.  PASRR submitted to EDS on 07/09/17     PASRR number received on       Existing PASRR number confirmed on 07/09/17     FL2 transmitted to all facilities in geographic area requested by pt/family on 07/09/17     FL2 transmitted to all facilities within larger geographic area on       Patient informed that his/her managed care company has contracts with or will negotiate with certain facilities, including the following:        Yes   Patient/family informed of bed offers received.  Patient chooses bed at [facility]     Physician recommends and patient chooses bed at      Patient to be transferred to [facility] on 07/15/17.  Patient to be transferred to facility by [facility]     Patient family notified on 07/15/17 of transfer.  Name of family member notified:  Lorriane Shire     PHYSICIAN       Additional Comment:     _______________________________________________ Candie Chroman, LCSW 07/15/2017, 12:07 PM

## 2017-07-15 NOTE — Clinical Social Work Note (Signed)
Insurance authorization approved for SNF: [id number]. Patient can discharge to SNF today if stable. CSW paged MD to notify.  [name], [place]

## 2017-07-15 NOTE — Progress Notes (Signed)
ANTICOAGULATION CONSULT NOTE - Follow Up Consult  Pharmacy Consult for warfarin Indication: atrial fibrillation and VTE prophylaxis  No Known Allergies  Patient Measurements: Height: 5\' 5"  (165.1 cm) Weight: 188 lb 15 oz (85.7 kg)(bed scale) IBW/kg (Calculated) : 57   Vital Signs: Temp: 97.9 F (36.6 C) (01/23 0638) Temp Source: Oral (01/23 5093) BP: 97/51 (01/23 2671) Pulse Rate: 65 (01/23 0638)  Labs: Recent Labs    07/13/17 0422 07/14/17 0455 07/14/17 1300 07/15/17 0613  HGB  --   --  11.0*  --   HCT  --   --  33.9*  --   PLT  --   --  160  --   LABPROT 24.3* 26.6*  --  28.2*  INR 2.21 2.48  --  2.67  CREATININE 3.76* 4.47*  --   --     Estimated Creatinine Clearance: 12.1 mL/min (A) (by C-G formula based on SCr of 4.47 mg/dL (H)).  Medications:  Scheduled:  . amiodarone  200 mg Oral Daily  . calcitRIOL  0.25 mcg Oral Once per day on Tue Thu Sat  . darbepoetin (ARANESP) injection - DIALYSIS  60 mcg Intravenous Q Tue-HD  . feeding supplement (NEPRO CARB STEADY)  237 mL Oral BID BM  . feeding supplement (PRO-STAT SUGAR FREE 64)  30 mL Oral TID BM  . insulin aspart  0-20 Units Subcutaneous TID WC  . insulin aspart  0-5 Units Subcutaneous QHS  . insulin glargine  5 Units Subcutaneous Daily  . isosorbide mononitrate  30 mg Oral Daily  . levothyroxine  50 mcg Oral QAC breakfast  . multivitamin with minerals  1 tablet Oral Daily  . polyethylene glycol  17 g Oral Daily  . pravastatin  80 mg Oral QHS  . sodium chloride flush  3 mL Intravenous Q12H  . sodium chloride flush  3 mL Intravenous Q12H  . warfarin  1.25 mg Oral Once per day on Mon Wed Fri  . [START ON 07/16/2017] warfarin  2.5 mg Oral Once per day on Sun Tue Thu Sat  . Warfarin - Pharmacist Dosing Inpatient   Does not apply q1800    Assessment: 72 y.o female presented with SOB and recent admit on 1/4 for CHF exacerbation.  On warfarin for afib prior to admission with therapeutic INR 3 upon admission.    Home regimen: 1.25 mg on MWF and 2.5 mg all other days (weekly dose 13.75mg ).  INR previously labile -  Now remains therapeutic.  INR 2.6 s/p TEE/DCCV Afib > SR 1/18 - will aim to keep INR > 2. Amiodarone dose slightly higher than home dose.  No overt bleeding or complications noted. CBC stable.  Goal of Therapy:  INR 2-3 Monitor platelets by anticoagulation protocol: Yes   Plan:  Restart home dose Warfarin 2.5mg  PO TTSS / warfarin 1.25mg  MWF To be followed at SNF when DC Monitor daily INR, CBC, signs/symptoms of bleeding  Bonnita Nasuti Pharm.D. CPP, BCPS Clinical Pharmacist [phone] 07/15/2017 10:58 AM

## 2017-07-15 NOTE — Care Management Important Message (Signed)
Important Message  Patient Details  Name: [name] MRN: [id number] Date of Birth: 1946-06-23   Medicare Important Message Given:  Yes    Tomesha Sargent [address] 07/15/2017, 11:47 AM

## 2017-07-15 NOTE — Clinical Social Work Note (Signed)
CSW facilitated patient discharge including contacting patient family and facility to confirm patient discharge plans. Clinical information faxed to facility and family agreeable with plan. CSW arranged ambulance transport via [facility] to [facility] [place] at 1:45 pm. RN to call report prior to discharge [phone]).  CSW will sign off for now as social work intervention is no longer needed. Please consult [place] again if new needs arise.  Dayton Scrape, [place]

## 2017-07-15 NOTE — Progress Notes (Signed)
Patient ID: [name], female   DOB: [birth year], 72 y.o.   MRN: [id number]     Advanced Heart Failure Rounding Note  Primary Cardiologist: Aundra Dubin  Subjective:    S/p DCCV 07/10/17. Maintaining NSR  Feeling OK this am. Anxious to get out of hospital. Possibly today. Tolerated HD yesterday but did have mild lightheadedness and required midodrine. Denies SOB or CP.    Objective:   Weight Range: 188 lb 15 oz (85.7 kg) Body mass index is 31.44 kg/m.   Vital Signs:   Temp:  [97.9 F (36.6 C)-98.3 F (36.8 C)] 97.9 F (36.6 C) (01/23 4332) Pulse Rate:  [65-76] 65 (01/23 0638) Resp:  [12-18] 16 (01/22 2024) BP: (92-110)/(46-64) 97/51 (01/23 9518) SpO2:  [97 %-99 %] 97 % (01/23 8416) Weight:  [188 lb 15 oz (85.7 kg)] 188 lb 15 oz (85.7 kg) (01/23 0638) Last BM Date: 07/15/17  Weight change: Filed Weights   07/13/17 0535 07/14/17 0646 07/15/17 6063  Weight: 191 lb 12.8 oz (87 kg) 190 lb 4.1 oz (86.3 kg) 188 lb 15 oz (85.7 kg)   Intake/Output:   Intake/Output Summary (Last 24 hours) at 07/15/2017 0807 Last data filed at 07/15/2017 0700 Gross per 24 hour  Intake 480 ml  Output 2015 ml  Net -1535 ml    Physical Exam   General: Elderly appearing. NAD.  HEENT: Normal Neck: Supple. JVP 5-6. Carotids 2+ bilat; no bruits. No thyromegaly or nodule noted. Cor: PMI nondisplaced. RRR, No M/G/R noted Lungs: CTAB, normal effort. Abdomen: Soft, non-tender, non-distended, no HSM. No bruits or masses. +BS  Extremities: No cyanosis, clubbing, or rash. RUE AVF. No edema.  Neuro: Alert & orientedx3, cranial nerves grossly intact. moves all 4 extremities w/o difficulty. Affect pleasant   Telemetry   NSR 60-70s, personally reviewed.   EKG    No new tracings.    Labs    CBC Recent Labs    07/14/17 1300  WBC 6.7  HGB 11.0*  HCT 33.9*  MCV 97.1  PLT 016   Basic Metabolic Panel Recent Labs    07/13/17 0422 07/14/17 0455  NA 134* 133*  K 4.2 4.4  CL 97* 97*  CO2 25 23   GLUCOSE 122* 116*  BUN 65* 78*  CREATININE 3.76* 4.47*  CALCIUM 8.6* 8.7*   Liver Function Tests No results for input(s): AST, ALT, ALKPHOS, BILITOT, PROT, ALBUMIN in the last 72 hours. No results for input(s): LIPASE, AMYLASE in the last 72 hours. Cardiac Enzymes No results for input(s): CKTOTAL, CKMB, CKMBINDEX, TROPONINI in the last 72 hours.  BNP: BNP (last 3 results) Recent Labs    06/26/17 0806 07/02/17 1131 07/03/17 0501  BNP 2,226.0* 1,605.1* 2,179.2*    ProBNP (last 3 results) No results for input(s): PROBNP in the last 8760 hours.   D-Dimer No results for input(s): DDIMER in the last 72 hours. Hemoglobin A1C No results for input(s): HGBA1C in the last 72 hours. Fasting Lipid Panel No results for input(s): CHOL, HDL, LDLCALC, TRIG, CHOLHDL, LDLDIRECT in the last 72 hours. Thyroid Function Tests No results for input(s): TSH, T4TOTAL, T3FREE, THYROIDAB in the last 72 hours.  Invalid input(s): FREET3  Other results:   Imaging    No results found.   Medications:     Scheduled Medications: . amiodarone  200 mg Oral Daily  . calcitRIOL  0.25 mcg Oral Once per day on Tue Thu Sat  . darbepoetin (ARANESP) injection - DIALYSIS  60 mcg Intravenous Q Tue-HD  .  feeding supplement (NEPRO CARB STEADY)  237 mL Oral BID BM  . feeding supplement (PRO-STAT SUGAR FREE 64)  30 mL Oral TID BM  . insulin aspart  0-20 Units Subcutaneous TID WC  . insulin aspart  0-5 Units Subcutaneous QHS  . insulin glargine  5 Units Subcutaneous Daily  . isosorbide mononitrate  30 mg Oral Daily  . levothyroxine  50 mcg Oral QAC breakfast  . multivitamin with minerals  1 tablet Oral Daily  . polyethylene glycol  17 g Oral Daily  . pravastatin  80 mg Oral QHS  . sodium chloride flush  3 mL Intravenous Q12H  . sodium chloride flush  3 mL Intravenous Q12H  . warfarin  2.5 mg Oral q1800  . Warfarin - Pharmacist Dosing Inpatient   Does not apply q1800    Infusions: . sodium chloride     . sodium chloride    . ferric gluconate (FERRLECIT/NULECIT) IV Stopped (07/14/17 1742)    PRN Medications: sodium chloride, acetaminophen **OR** acetaminophen, albuterol, alum & mag hydroxide-simeth, guaiFENesin-dextromethorphan, lidocaine-prilocaine, midodrine, ondansetron **OR** ondansetron (ZOFRAN) IV, oxyCODONE-acetaminophen **AND** oxyCODONE, senna-docusate, sodium chloride flush, sodium chloride flush, zolpidem    Patient Profile   72 yo with history of chronic systolic CHF from nonischemic cardiomyopathy, prior PE, CKD IV-V, and paroxysmal atrial fibrillation presented with dyspnea and progressive renal dysfunction.   Assessment/Plan   1. Acute on chronic systolic CHF: Echo (6/83) with EF 20-25%, nonischemic cardiomyopathy.  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   - Volume status stable with HD.  - Suspect the majority of her dyspnea is due to CHF, COPD probably plays a lesser role.  - Coreg stopped with hypotension - Still requiring midodrine on HD days, so no room to add afterload reduction at this time.  - Continue imdur 30 mg daily. Can consider hydralazine as needed/tolerated.  - No ACE/ARB/ARNI with ESRD. - Coreg stopped 1/14 due to low BP.  2. 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  - HD new this admission. AVF previously placed.  - Nephrology following.  Dialyzing on T/Th/Sat schedule. No change.  3. Atrial fibrillation: Persistent.  Was in NSR in 9/18 but has been in atrial fibrillation since admitted this time.  - Remains in NSR s/p DCCV 07/10/17 - Continue amiodarone 200 mg daily.  - Continue warfarin, INR 2.67. Dosing per pharm. PCP manages INR as outpatient. Will need follow up.  4. OSA:  - Continue nightly CPAP. No change.  5. COPD:  - Per primary. Stable.  6. Deconditioning: - PT/OT recommending SNF. Possibly today.   HF meds for discharge - Amiodarone 200 mg daily - IMdur 30 mg daily -  Warfarin.  Outpatient dosing per PCP. Will need follow up or management by SNF. Per primary.  - Pravastatin 80 mg daily  HF follow up set up for 07/22/17.  HF team will see as needed.   Length of Stay: [place], [place]  07/15/2017, 8:07 AM  Advanced Heart Failure Team Pager [phone] (M-F; 7a - 4p)  Please contact [facility] for night-coverage after hours (4p -7a ) and weekends on [email or web address]  Patient seen with PA, agree with the above note.  She is maintaining NSR.  Continue amiodarone and warfarin.  Tolerating HD so far.  Will need SNF.  We will see as needed, will arrange followup.   Loralie Champagne 07/15/2017 9:40 AM

## 2017-07-16 ENCOUNTER — Other Ambulatory Visit (HOSPITAL_COMMUNITY): Payer: Self-pay | Admitting: Emergency Medicine

## 2017-07-16 NOTE — Progress Notes (Unsigned)
[name] from [place] called and wanted to know the name of the injection and lab work the patient was receiving from [place].  Pt gets Aranesp 150 mcg every 4 weeks with CBC/diff prior to the injection.  You have to wait on the lab work to see what the hemoglobin is and you hold the injection if the hemoglobin is greater than 11.  She has additional labs ordered with the next office visit that is in March.  I have faxed [phone]) the aranesp orders, lab orders, and an appointment schedule to [place].  She has my direct number if she has any more questions.  She is going to give this information to the doctor to see if they may possibly start giving her the aranesp injections and doing the lab work at the nursing home.

## 2017-07-22 ENCOUNTER — Ambulatory Visit (HOSPITAL_COMMUNITY)
Admission: RE | Admit: 2017-07-22 | Discharge: 2017-07-22 | Disposition: A | Discharge status: Home / Self Care | Payer: Medicare HMO | Source: Ambulatory Visit | Attending: Cardiology | Admitting: Cardiology

## 2017-07-22 VITALS — BP 120/68 | HR 115

## 2017-07-22 DIAGNOSIS — I5022 Chronic systolic (congestive) heart failure: Secondary | ICD-10-CM | POA: Diagnosis present

## 2017-07-22 DIAGNOSIS — I4891 Unspecified atrial fibrillation: Secondary | ICD-10-CM

## 2017-07-22 DIAGNOSIS — Z992 Dependence on renal dialysis: Secondary | ICD-10-CM | POA: Insufficient documentation

## 2017-07-22 DIAGNOSIS — I480 Paroxysmal atrial fibrillation: Secondary | ICD-10-CM | POA: Insufficient documentation

## 2017-07-22 DIAGNOSIS — E785 Hyperlipidemia, unspecified: Secondary | ICD-10-CM | POA: Diagnosis not present

## 2017-07-22 DIAGNOSIS — Z86010 Personal history of colonic polyps: Secondary | ICD-10-CM | POA: Insufficient documentation

## 2017-07-22 DIAGNOSIS — Z7984 Long term (current) use of oral hypoglycemic drugs: Secondary | ICD-10-CM | POA: Insufficient documentation

## 2017-07-22 DIAGNOSIS — I429 Cardiomyopathy, unspecified: Secondary | ICD-10-CM | POA: Insufficient documentation

## 2017-07-22 DIAGNOSIS — Z96641 Presence of right artificial hip joint: Secondary | ICD-10-CM | POA: Diagnosis not present

## 2017-07-22 DIAGNOSIS — G4733 Obstructive sleep apnea (adult) (pediatric): Secondary | ICD-10-CM | POA: Insufficient documentation

## 2017-07-22 DIAGNOSIS — Z7902 Long term (current) use of antithrombotics/antiplatelets: Secondary | ICD-10-CM | POA: Insufficient documentation

## 2017-07-22 DIAGNOSIS — Z87891 Personal history of nicotine dependence: Secondary | ICD-10-CM | POA: Diagnosis not present

## 2017-07-22 DIAGNOSIS — I5042 Chronic combined systolic (congestive) and diastolic (congestive) heart failure: Secondary | ICD-10-CM

## 2017-07-22 DIAGNOSIS — I120 Hypertensive chronic kidney disease with stage 5 chronic kidney disease or end stage renal disease: Secondary | ICD-10-CM | POA: Diagnosis not present

## 2017-07-22 DIAGNOSIS — K219 Gastro-esophageal reflux disease without esophagitis: Secondary | ICD-10-CM | POA: Insufficient documentation

## 2017-07-22 DIAGNOSIS — Z79899 Other long term (current) drug therapy: Secondary | ICD-10-CM | POA: Insufficient documentation

## 2017-07-22 DIAGNOSIS — N186 End stage renal disease: Secondary | ICD-10-CM | POA: Diagnosis not present

## 2017-07-22 DIAGNOSIS — Z95810 Presence of automatic (implantable) cardiac defibrillator: Secondary | ICD-10-CM | POA: Insufficient documentation

## 2017-07-22 DIAGNOSIS — Z8249 Family history of ischemic heart disease and other diseases of the circulatory system: Secondary | ICD-10-CM | POA: Diagnosis not present

## 2017-07-22 MED ORDER — AMIODARONE HCL 200 MG PO TABS: 200.0000 mg | ORAL_TABLET | Freq: Two times a day (BID) | ORAL | 11 refills | Status: AC

## 2017-07-22 NOTE — Patient Instructions (Signed)
INCREASE Amiodarone to 200 mg tablet TWICE daily.  Follow up 2 weeks with Amy Clegg NP-C and EKG.  __________________________________________________________________ [name] Code: 9001  Take all medication as prescribed the day of your appointment. Bring all medications with you to your appointment.  Do the following things EVERYDAY: 1) Weigh yourself in the morning before breakfast. Write it down and keep it in a log. 2) Take your medicines as prescribed 3) Eat low salt foods-Limit salt (sodium) to 2000 mg per day.  4) Stay as active as you can everyday 5) Limit all fluids for the day to less than 2 liters

## 2017-07-22 NOTE — Progress Notes (Signed)
Patient ID: [name], female   DOB: 01-06-1946, 72 y.o.   MRN: [id number] PCP: Dr. Karie Kirks HF Cardiology: Dr. Aundra Dubin Nephrology: Dr Joelyn Oms  72 yo with history of chronic systolic CHF from nonischemic cardiomyopathy, prior PE, CKD, and paroxysmal atrial fibrillation presents for CHF clinic followup.  She has a long history of cardiomyopathy, dating back to 1999.  At that time, she had coronary angiography showing no significant disease. She was initially followed by Dr Lattie Haw, later by Dr. Lovena Le. She had her initial CRT-D device in 2005.  This was removed in 2007 due to enterococcal bacteremia with vegetation.  She had Medtronic CRT- D device placed in 8/14.  She has had trouble with paroxysmal atrial fibrillation, and she was put on amiodarone and cardioverted in 12/14.  TEE in 12/14 showed EF 15% but RV appeared normal.   Had RHC on 04/18/14 for possible low output. This showed volume depletion with normal cardiac output. Lasix held for a few days then cut back from 80 bid to 40 bid. Lisinopril also held but has been restarted.   Admitted with anemia 6/9 through 12/02/2014. Received 1UPRBCs. Diuresed with IV lasix. She had follow up with hematology for Epogen injections. Discharge weight was 152 pounds.   She was admitted later in 6/16, this time with enterococcal bacteremia and pacemaker lead vegetation by TEE.  Her CRT-D device was again extracted.  Her device has remained out since that time.  She has not noted a significant change in symptoms without BiV pacing.    Admitted 07/02/17 with increased shortness and fatigue. Volume overloaded with poor response to IV diuresis. She was uremic and started on HD. She was in A fib and underwent TEE with successful cardioversion. Later discharged to SNF.   Today she returns for post hospital follow up. Overall feeling fair. Having fatigue after dialysis. Not walking much.  Denies SOB/PND/Orthopnea. Appetite fair. ok. No fever or chills. Continues on  HD 3 days a week. All medications provided at [facility].     Labs (7/15): K 3.5, creatinine 1.78, hemoglobin 9.4,m BNP 24607 Labs (10/15): K 4.7, creatinine 2.3, digoxin 0.7, BNP 8376, HCT 30.9 Labs (04/18/14): K 4.3 creatinine 2.8  Labs (11/15): K 5.3, creatinine 1.9 Labs (10/26/2014) K 3.7 Creatinine 1.94 Hgb 9.3, LFTs normal Labs (11/2014): K 3.6 Creatinine 1.80=>1.65, digoxin 0.5 Labs (7/16): hgb 9 Labs (01/22/15): K 4.0 creatinine 1.67 Labs (8/17): K 4.6, creatinine 2.52, hgb 10.1 Labs (9/17): K 4.8, creatinine 2.78 Labs (10/17): hgb 10.7 Labs (07/14/2017) : K 4.4 Creatinine 4.4   EKG: A fib    PMH:  1. Chronic systolic CHF: Nonischemic cardiomyopathy.  1999 had LHC with normal coronaries.  12/05 had Guidant BiV ICD placed. 5/07 had enterococcal bacteremia with ICD lead vegetation, device extracted.  8/14 had placement of Medtronic BiV ICD device.  Cardiolite (12/09) with multiple wall segments showing scarring, some inferior ischemia.  Echo (6/14) with EF 15-20%, diffuse hypokinesis with some regionality, normal RV size and systolic function.  TEE (12/14) with EF 15%, normal RV size and systolic function. RHC (10/15) with mean RA 1, PA 27/8, mean PCWP 5, CI 2.71. Enterococcal bacteremia 6/16 with CRT-D extraction.  Echo (4/16) with EF 15-20%, moderate LVH, diffuse hypokinesis, mild-moderate AI, mild MR, PASP 33 mmHg.  - Echo (9/17) with EF 20%, moderate LV dilation, moderate MR, normal RV size/systolic function.  2. PE: 2/07 after right THR.  3. Right THR 4. LBBB 5. GERD 6. Hyperlipidemia 7. HTN 8. Anemia: 12/14  had EGD that was normal, colonoscopy with 3 polyps removed.  9. ESRD- on HD 06/2017  10. OSA: Sleep study 9/16 with moderate to severe OSA.  11. Atrial fibrillation: Paroxysmal.  TEE-guided DCCV in 2/14, TEE-guided DCCV in 12/14 on amiodarone. TEE DCCV 06/2017  12. Enterococcal bacteremia with CRT-D device removal in 5/07 then again in 6/16.    SH: Married, lives in [place],  previous smoker.   FH: Mother with MI.   ROS: All systems reviewed and negative except as per HPI.   Current Outpatient Medications  Medication Sig Dispense Refill  . Amino Acids-Protein Hydrolys (FEEDING SUPPLEMENT, PRO-STAT SUGAR FREE 64,) LIQD Take 30 mLs by mouth 3 (three) times daily between meals.    Marland Kitchen amiodarone (PACERONE) 200 MG tablet Take 1 tablet (200 mg total) by mouth daily.    . calcitRIOL (ROCALTROL) 0.25 MCG capsule Take 0.25 mcg by mouth daily.    . feeding supplement, ENSURE ENLIVE, (ENSURE ENLIVE) LIQD Take 237 mLs by mouth 2 (two) times daily between meals. (Patient taking differently: Take 237 mLs by mouth daily with lunch. ) 237 mL 12  . glipiZIDE (GLUCOTROL) 5 MG tablet Take 0.5 tablets (2.5 mg total) by mouth daily before breakfast.    . guaiFENesin-dextromethorphan (ROBITUSSIN DM) 100-10 MG/5ML syrup Take 5 mLs by mouth every 4 (four) hours as needed for cough. 118 mL 0  . isosorbide mononitrate (IMDUR) 30 MG 24 hr tablet Take 1 tablet (30 mg total) by mouth daily. 30 tablet 0  . levothyroxine (SYNTHROID, LEVOTHROID) 50 MCG tablet Take 50 mcg by mouth daily before breakfast.    . midodrine (PROAMATINE) 5 MG tablet Take 1 tablet (5 mg total) by mouth every dialysis (low BP.  Please give 30 min prior to HD).    Marland Kitchen ondansetron (ZOFRAN) 4 MG tablet Take 1 tablet (4 mg total) by mouth every 8 (eight) hours as needed for nausea or vomiting. Reported on 11/06/2015    . oxyCODONE-acetaminophen (PERCOCET) 10-325 MG tablet Take 1 tablet by mouth every 6 (six) hours as needed for pain. 10 tablet 0  . polyethylene glycol powder (GLYCOLAX/MIRALAX) powder Take 17 g by mouth daily. 255 g 0  . pravastatin (PRAVACHOL) 40 MG tablet Take 80 mg by mouth at bedtime.     . senna-docusate (SENOKOT-S) 8.6-50 MG tablet Take 2 tablets by mouth daily as needed for mild constipation. 15 tablet 0  . warfarin (COUMADIN) 2.5 MG tablet Take 0.5-1 tablets (1.25-2.5 mg total) by mouth daily at 6 PM. Takes  1 tablet on Mon, Wed, Fri, take 0.5 tablet on all other days (Patient taking differently: Take 1.25-2.5 mg by mouth daily at 6 PM. Take 0.5 tablet (1.25 mg) by mouth on Monday, Wednesday, & Friday then Take 1 tablet (2.5 mg) on all other days Sunday, Tuesday, Thursday, & Saturday)     No current facility-administered medications for this encounter.     BP 120/68   Pulse (!) 115   SpO2 96%   General: Appears chronically ill.  No resp difficulty. Arrived in wheel chair. Husband present.  HEENT: normal Neck: supple. JVP 7-8. Carotids 2+ bilat; no bruits. No lymphadenopathy or thryomegaly appreciated. Cor: PMI nondisplaced. Tachy Irregular rate & rhythm. No rubs, gallops or murmurs. Lungs: clear Abdomen: soft, nontender, nondistended. No hepatosplenomegaly. No bruits or masses. Good bowel sounds. Extremities: no cyanosis, clubbing, rash, edema. RUE AVF  Neuro: alert & orientedx3, cranial nerves grossly intact. moves all 4 extremities w/o difficulty. Affect pleasant   EKG:  A fib 119 bpm  Assessment/Plan: 1. Chronic systolic CHF: Patient has a nonischemic cardiomyopathy. Echo (9/17) with EF 20%, moderate LV dilation. RHC (10/15) showed low filling pressures and normal cardiac output.She is unable to do CPX testing and probably would not be a good LVAD candidate. She had removal of CRT-D system for the 2nd time due to endocarditis in 6/16, symptoms seem about the same without BiV pacing as with.  NYHA IIIIb. Volume status managed with HD - off bb with hypotenision. - spiro and ace with CKD  - - Would not replace CRT-D system at this point given 2 episodes of endocarditis now with device explantation.  She also is not interested in replacing the device.  2. Atrial fibrillation: Paroxysmal. S/P TEE DCCV 06/2017. Back in A fib RVR today.  Increase amio to 200 mg twice a day. Follow up in 2 weeks with EKG if she remains in A fib will stop amiodarone and start Toprol XL.  ,- On coumadin. No  bleeding problems.  3. ESRD on HD 3 days a week..  4. Anemia: She had EGD and c-scope in 12/14. In 12/15, she had transfusion but apparently no-showed for her GI workup. Seen by hematology earlier this year, suspect normocytic anemia is most likely due to combination of CKD and hypothyroidism, less likely MDS. Erythropoeitin started. She has had 5 units PRBCs in 5/16 and 1 unit in 6/16. Per GI: Iron deficiency anemia with blood in stool, few scattered lymphangiectasias noted on the small bowel capsule study but no source of blood loss identified. Small lesions could be missed as she had some debris in the small bowel. Continue aranesp injections every 3 weeks.  If she has recurrent bleed can consider stopping coumadin  5. OSA: Moderate to severe OSA on sleep study.    Followup in 2 weeks with EKG. If remains in A fib will stop amio and start toprol xl.    Amy Clegg NP-C  07/22/2017

## 2017-08-04 ENCOUNTER — Ambulatory Visit (HOSPITAL_COMMUNITY): Payer: Medicare HMO

## 2017-08-04 ENCOUNTER — Emergency Department (HOSPITAL_COMMUNITY): Payer: Medicare HMO

## 2017-08-04 ENCOUNTER — Other Ambulatory Visit (HOSPITAL_COMMUNITY): Payer: Medicare HMO

## 2017-08-04 ENCOUNTER — Encounter (HOSPITAL_COMMUNITY): Payer: Self-pay | Admitting: *Deleted

## 2017-08-04 ENCOUNTER — Other Ambulatory Visit: Payer: Self-pay

## 2017-08-04 ENCOUNTER — Observation Stay (HOSPITAL_COMMUNITY)
Admission: EM | Admit: 2017-08-04 | Discharge: 2017-08-21 | Disposition: E | Payer: Medicare HMO | Attending: Internal Medicine | Admitting: Internal Medicine

## 2017-08-04 DIAGNOSIS — E875 Hyperkalemia: Secondary | ICD-10-CM | POA: Diagnosis present

## 2017-08-04 DIAGNOSIS — IMO0002 Reserved for concepts with insufficient information to code with codable children: Secondary | ICD-10-CM | POA: Diagnosis present

## 2017-08-04 DIAGNOSIS — R0902 Hypoxemia: Secondary | ICD-10-CM | POA: Diagnosis not present

## 2017-08-04 DIAGNOSIS — I480 Paroxysmal atrial fibrillation: Secondary | ICD-10-CM | POA: Diagnosis present

## 2017-08-04 DIAGNOSIS — I959 Hypotension, unspecified: Secondary | ICD-10-CM | POA: Diagnosis present

## 2017-08-04 DIAGNOSIS — Z87891 Personal history of nicotine dependence: Secondary | ICD-10-CM | POA: Insufficient documentation

## 2017-08-04 DIAGNOSIS — R627 Adult failure to thrive: Secondary | ICD-10-CM | POA: Diagnosis not present

## 2017-08-04 DIAGNOSIS — I452 Bifascicular block: Secondary | ICD-10-CM | POA: Insufficient documentation

## 2017-08-04 DIAGNOSIS — Z86711 Personal history of pulmonary embolism: Secondary | ICD-10-CM | POA: Insufficient documentation

## 2017-08-04 DIAGNOSIS — E039 Hypothyroidism, unspecified: Secondary | ICD-10-CM | POA: Diagnosis not present

## 2017-08-04 DIAGNOSIS — M170 Bilateral primary osteoarthritis of knee: Secondary | ICD-10-CM | POA: Insufficient documentation

## 2017-08-04 DIAGNOSIS — I5043 Acute on chronic combined systolic (congestive) and diastolic (congestive) heart failure: Secondary | ICD-10-CM | POA: Diagnosis not present

## 2017-08-04 DIAGNOSIS — E1165 Type 2 diabetes mellitus with hyperglycemia: Secondary | ICD-10-CM

## 2017-08-04 DIAGNOSIS — J449 Chronic obstructive pulmonary disease, unspecified: Secondary | ICD-10-CM | POA: Diagnosis not present

## 2017-08-04 DIAGNOSIS — K219 Gastro-esophageal reflux disease without esophagitis: Secondary | ICD-10-CM | POA: Insufficient documentation

## 2017-08-04 DIAGNOSIS — E1151 Type 2 diabetes mellitus with diabetic peripheral angiopathy without gangrene: Secondary | ICD-10-CM | POA: Insufficient documentation

## 2017-08-04 DIAGNOSIS — I481 Persistent atrial fibrillation: Secondary | ICD-10-CM | POA: Diagnosis not present

## 2017-08-04 DIAGNOSIS — D631 Anemia in chronic kidney disease: Secondary | ICD-10-CM | POA: Diagnosis not present

## 2017-08-04 DIAGNOSIS — Z96641 Presence of right artificial hip joint: Secondary | ICD-10-CM | POA: Insufficient documentation

## 2017-08-04 DIAGNOSIS — J810 Acute pulmonary edema: Secondary | ICD-10-CM | POA: Diagnosis present

## 2017-08-04 DIAGNOSIS — I132 Hypertensive heart and chronic kidney disease with heart failure and with stage 5 chronic kidney disease, or end stage renal disease: Principal | ICD-10-CM | POA: Insufficient documentation

## 2017-08-04 DIAGNOSIS — I429 Cardiomyopathy, unspecified: Secondary | ICD-10-CM | POA: Insufficient documentation

## 2017-08-04 DIAGNOSIS — Z79899 Other long term (current) drug therapy: Secondary | ICD-10-CM | POA: Insufficient documentation

## 2017-08-04 DIAGNOSIS — R0600 Dyspnea, unspecified: Secondary | ICD-10-CM

## 2017-08-04 DIAGNOSIS — I491 Atrial premature depolarization: Secondary | ICD-10-CM | POA: Diagnosis not present

## 2017-08-04 DIAGNOSIS — G4733 Obstructive sleep apnea (adult) (pediatric): Secondary | ICD-10-CM | POA: Diagnosis present

## 2017-08-04 DIAGNOSIS — N186 End stage renal disease: Secondary | ICD-10-CM | POA: Insufficient documentation

## 2017-08-04 DIAGNOSIS — M19019 Primary osteoarthritis, unspecified shoulder: Secondary | ICD-10-CM | POA: Insufficient documentation

## 2017-08-04 DIAGNOSIS — E785 Hyperlipidemia, unspecified: Secondary | ICD-10-CM | POA: Diagnosis not present

## 2017-08-04 DIAGNOSIS — Z992 Dependence on renal dialysis: Secondary | ICD-10-CM

## 2017-08-04 DIAGNOSIS — E1122 Type 2 diabetes mellitus with diabetic chronic kidney disease: Secondary | ICD-10-CM | POA: Insufficient documentation

## 2017-08-04 DIAGNOSIS — I428 Other cardiomyopathies: Secondary | ICD-10-CM

## 2017-08-04 DIAGNOSIS — M1612 Unilateral primary osteoarthritis, left hip: Secondary | ICD-10-CM | POA: Diagnosis not present

## 2017-08-04 DIAGNOSIS — Z7901 Long term (current) use of anticoagulants: Secondary | ICD-10-CM | POA: Insufficient documentation

## 2017-08-04 DIAGNOSIS — I5023 Acute on chronic systolic (congestive) heart failure: Secondary | ICD-10-CM | POA: Diagnosis present

## 2017-08-04 DIAGNOSIS — E1129 Type 2 diabetes mellitus with other diabetic kidney complication: Secondary | ICD-10-CM | POA: Diagnosis present

## 2017-08-04 LAB — BASIC METABOLIC PANEL
Anion gap: >20 — ABNORMAL HIGH (ref 5–15)
BUN: 62 mg/dL — AB (ref 6–20)
CHLORIDE: 83 mmol/L — AB (ref 101–111)
CO2: 15 mmol/L — AB (ref 22–32)
CREATININE: 5.57 mg/dL — AB (ref 0.44–1.00)
Calcium: 9.5 mg/dL (ref 8.9–10.3)
GFR calc Af Amer: 8 mL/min — ABNORMAL LOW (ref >60)
GFR calc non Af Amer: 7 mL/min — ABNORMAL LOW (ref >60)
Glucose, Bld: 183 mg/dL — ABNORMAL HIGH (ref 65–99)
POTASSIUM: 6.3 mmol/L — AB (ref 3.5–5.1)
Sodium: 128 mmol/L — ABNORMAL LOW (ref 135–145)

## 2017-08-04 LAB — CBC WITH DIFFERENTIAL/PLATELET
BASOS PCT: 0 %
Basophils Absolute: 0 10*3/uL (ref 0.0–0.1)
EOS PCT: 0 %
Eosinophils Absolute: 0 10*3/uL (ref 0.0–0.7)
HEMATOCRIT: 43.9 % (ref 36.0–46.0)
Hemoglobin: 13.6 g/dL (ref 12.0–15.0)
LYMPHS ABS: 0.7 10*3/uL (ref 0.7–4.0)
Lymphocytes Relative: 5 %
MCH: 33.1 pg (ref 26.0–34.0)
MCHC: 31 g/dL (ref 30.0–36.0)
MCV: 106.8 fL — AB (ref 78.0–100.0)
MONO ABS: 0.7 10*3/uL (ref 0.1–1.0)
MONOS PCT: 5 %
Neutro Abs: 11.9 10*3/uL — ABNORMAL HIGH (ref 1.7–7.7)
Neutrophils Relative %: 90 %
PLATELETS: 166 10*3/uL (ref 150–400)
RBC: 4.11 MIL/uL (ref 3.87–5.11)
RDW: 23 % — AB (ref 11.5–15.5)
WBC: 13.3 10*3/uL — AB (ref 4.0–10.5)

## 2017-08-04 LAB — PROTIME-INR
INR: 2.64
Prothrombin Time: 28 seconds — ABNORMAL HIGH (ref 11.4–15.2)

## 2017-08-04 LAB — TROPONIN I: Troponin I: 0.09 ng/mL (ref <0.03)

## 2017-08-04 MED ORDER — DEXTROSE 50 % IV SOLN
INTRAVENOUS | Status: AC
  Administered 2017-08-04: 50 mL via INTRAVENOUS
  Filled 2017-08-04: qty 50

## 2017-08-04 MED ORDER — INSULIN ASPART 100 UNIT/ML ~~LOC~~ SOLN
10.0000 [IU] | Freq: Once | SUBCUTANEOUS | Status: DC
  Administered 2017-08-04: 10 [IU] via INTRAVENOUS

## 2017-08-04 MED ORDER — SODIUM CHLORIDE 0.9 % IV SOLN
100.0000 mL | INTRAVENOUS | Status: DC | PRN
  Administered 2017-08-04: 100 mL via INTRAVENOUS

## 2017-08-04 MED ORDER — WARFARIN SODIUM 1 MG PO TABS
1.5000 mg | ORAL_TABLET | Freq: Once | ORAL | Status: DC
Start: 2017-08-04 — End: 2017-08-04
  Filled 2017-08-04: qty 1.5

## 2017-08-04 MED ORDER — GLIPIZIDE 5 MG PO TABS
2.5000 mg | ORAL_TABLET | Freq: Every day | ORAL | Status: DC
  Filled 2017-08-04: qty 1

## 2017-08-04 MED ORDER — SODIUM CHLORIDE 0.9 % IV SOLN: 100.0000 mL | INTRAVENOUS | Status: DC | PRN

## 2017-08-04 MED ORDER — WARFARIN - PHARMACIST DOSING INPATIENT: Status: DC

## 2017-08-04 MED ORDER — SENNA 8.6 MG PO TABS
2.0000 | ORAL_TABLET | Freq: Every day | ORAL | Status: DC | PRN
  Filled 2017-08-04: qty 2

## 2017-08-04 MED ORDER — INSULIN ASPART 100 UNIT/ML ~~LOC~~ SOLN: 0.0000 [IU] | Freq: Every day | SUBCUTANEOUS | Status: DC

## 2017-08-04 MED ORDER — LIDOCAINE HCL (PF) 1 % IJ SOLN: 5.0000 mL | INTRAMUSCULAR | Status: DC | PRN

## 2017-08-04 MED ORDER — LIDOCAINE-PRILOCAINE 2.5-2.5 % EX CREA
1.0000 "application " | TOPICAL_CREAM | CUTANEOUS | Status: DC | PRN
  Filled 2017-08-04: qty 5

## 2017-08-04 MED ORDER — INSULIN ASPART 100 UNIT/ML ~~LOC~~ SOLN: 0.0000 [IU] | Freq: Three times a day (TID) | SUBCUTANEOUS | Status: DC

## 2017-08-04 MED ORDER — LEVOTHYROXINE SODIUM 25 MCG PO TABS: 50.0000 ug | ORAL_TABLET | Freq: Every day | ORAL | Status: DC

## 2017-08-04 MED ORDER — CALCITRIOL 0.25 MCG PO CAPS
0.2500 ug | ORAL_CAPSULE | Freq: Every day | ORAL | Status: DC
  Filled 2017-08-04 (×2): qty 1

## 2017-08-04 MED ORDER — ACETAMINOPHEN 650 MG RE SUPP: 650.0000 mg | Freq: Four times a day (QID) | RECTAL | Status: DC | PRN

## 2017-08-04 MED ORDER — SODIUM BICARBONATE 8.4 % IV SOLN
50.0000 meq | Freq: Once | INTRAVENOUS | Status: AC
  Administered 2017-08-04: 50 meq via INTRAVENOUS

## 2017-08-04 MED ORDER — HEPARIN SODIUM (PORCINE) 5000 UNIT/ML IJ SOLN: 5000.0000 [IU] | Freq: Three times a day (TID) | INTRAMUSCULAR | Status: DC

## 2017-08-04 MED ORDER — ACETAMINOPHEN 325 MG PO TABS: 650.0000 mg | ORAL_TABLET | Freq: Four times a day (QID) | ORAL | Status: DC | PRN

## 2017-08-04 MED ORDER — SODIUM CHLORIDE 0.9 % IV SOLN
1.0000 mg/h | INTRAVENOUS | Status: DC
  Administered 2017-08-04: 1 mg/h via INTRAVENOUS
  Filled 2017-08-04: qty 10

## 2017-08-04 MED ORDER — LORAZEPAM 2 MG/ML IJ SOLN: 0.5000 mg | INTRAMUSCULAR | Status: DC | PRN

## 2017-08-04 MED ORDER — PRAVASTATIN SODIUM 20 MG PO TABS
40.0000 mg | ORAL_TABLET | Freq: Every day | ORAL | Status: DC
  Filled 2017-08-04: qty 2

## 2017-08-04 MED ORDER — AMIODARONE HCL 200 MG PO TABS: 200.0000 mg | ORAL_TABLET | Freq: Two times a day (BID) | ORAL | Status: DC

## 2017-08-04 MED ORDER — ONDANSETRON HCL 4 MG PO TABS: 4.0000 mg | ORAL_TABLET | Freq: Three times a day (TID) | ORAL | Status: DC | PRN

## 2017-08-04 MED ORDER — DEXTROSE 50 % IV SOLN
1.0000 | Freq: Once | INTRAVENOUS | Status: AC
  Administered 2017-08-04: 50 mL via INTRAVENOUS

## 2017-08-04 MED ORDER — PENTAFLUOROPROP-TETRAFLUOROETH EX AERO
1.0000 "application " | INHALATION_SPRAY | CUTANEOUS | Status: DC | PRN
  Filled 2017-08-04: qty 30

## 2017-08-04 MED ORDER — MIDODRINE HCL 5 MG PO TABS
5.0000 mg | ORAL_TABLET | ORAL | Status: DC | PRN
  Administered 2017-08-04: 10 mg via ORAL
  Filled 2017-08-04: qty 1

## 2017-08-04 MED ORDER — POLYETHYLENE GLYCOL 3350 17 GM/SCOOP PO POWD
17.0000 g | Freq: Every day | ORAL | Status: DC
  Filled 2017-08-04: qty 255

## 2017-08-04 NOTE — ED Provider Notes (Signed)
MSE was initiated and I personally evaluated the patient and placed orders (if any) at  6:51 AM on August 04, 2017.  The patient appears stable so that the remainder of the MSE may be completed by another provider.  72 year old female with end-stage renal disease and dialysis every Tuesday-Thursday-Saturday comes in with shortness of breath which developed last night.  There may have been some excess sodium intake.  On exam, she does appear dyspneic.  Neck veins are distended.  Lungs have faint rales at the bases.  She has 1+ pitting edema.  Suspect fluid overload.  Screening labs obtained and patient left for oncoming provider to continue evaluation and treatment.   EKG Interpretation  Date/Time:  Tuesday August 04 2017 06:54:32 EST Ventricular Rate:  108 PR Interval:    QRS Duration: 237 QT Interval:  468 QTC Calculation: 645 R Axis:   -127 Text Interpretation:  Right and left arm electrode reversal, interpretation assumes no reversal Undetermined rhythm Atrial premature complex Left bundle branch block When compared with ECG of 12/20/2017, Rightward axis is now present Confirmed by Delora Fuel ([id number]) on 08/10/2017 3:81:01 AM         Delora Fuel, MD 75/10/25 [phone]

## 2017-08-04 NOTE — Progress Notes (Signed)
ANTICOAGULATION CONSULT NOTE - Initial Consult  Pharmacy Consult for Coumadin (home med) Indication: atrial fibrillation  No Known Allergies  Patient Measurements: Height: 5\' 5"  (165.1 cm) Weight: 188 lb (85.3 kg) IBW/kg (Calculated) : 57  Vital Signs: BP: 101/70 (02/12 1251) Pulse Rate: 27 (02/12 1100)  Labs: Recent Labs    08/07/2017 0713  HGB 13.6  HCT 43.9  PLT 166  LABPROT 28.0*  INR 2.64  CREATININE 5.57*  TROPONINI 0.09*   Estimated Creatinine Clearance: 9.7 mL/min (A) (by C-G formula based on SCr of 5.57 mg/dL (H)).  Medical History: Past Medical History:  Diagnosis Date  . Anemia    a. mild/chronic  . Anemia due to GI blood loss 02/26/2016  . Anemia in chronic renal disease 11/30/2014  . Cardiomyopathy, nonischemic ([place])    a. 1999 nl cath;  b. 12/05 [employer]ant [place];  c. 10/2005 ICD extraction 2/2 enterococcus bacteremia and Veg on RV lead;  c. 05/2008 low risk Myoview (scarring w/ some evidence of inf ischemia);  d. 11/2012 Echo: EF 15-20%;  e. 01/2013 s/p MDT [place] CRT D, ser # [id number];  f. 05/2013 Echo: EF 15%.  . Chronic systolic CHF (congestive heart failure) ([place])    a. 11/2012 Echo: EF 15-20%;  b. 05/2013 TEE EF 15%.  . CKD (chronic kidney disease), stage III ([place])    creatinin-1.44 in 1/09; 1.51 in 1/10  . Degenerative joint disease    of knees, shoulder, and hips  . Dyspnea   . E-coli UTI 06/2017  . Enterococcal infection    a. 10/2005 - AICD-explanted  . GERD (gastroesophageal reflux disease)   . Hilar density    a. infrahilar mass/adenopathy on CT scan 5/07; subsequently  resolved  . History of blood transfusion   . Hyperlipidemia   . Hypertension   . Hypothyroidism   . ICD (implantable cardioverter-defibrillator) infection ([place])    removed 2016  . Implantable cardioverter-defibrillator-CRT- Mdt    a.  01/2013 s/p MDT [place] CRT D, ser # [id number] - ICD has been removed  . LBBB (left bundle branch block)   . Obstructive sleep apnea    uses  cpap  . PAF (paroxysmal atrial fibrillation) ([place])    a. 07/2012 s/p TEE/DCCV;  b. chronic coumadin;  c. 05/2013 Recurrent Afib->TEE/DCCV and amio initiation.  . Peripheral vascular disease ([place])   . Pneumonia 07/2005  . Pulmonary embolism ([place])    a. 07/2005 after total right hip arthroplasty  . Tobacco abuse    a. discontinued in 1997, and then resumed  . Type II diabetes mellitus ([place])    type 2  . Urinary incontinence   . Villous adenoma of colon    a. tubovillous adenomatous polyp with focal high grade dysplasia; presented with hematochezia - followed by Dr. Laural Golden.   Medications:   (Not in a hospital admission)  Assessment: INR therapeutic on admission.   Goal of Therapy:  INR 2-3 Monitor platelets by anticoagulation protocol: Yes   Plan:  Coumadin 1.5mg  today at 6pm (home dose) INR daily  Hart Robinsons A 07/25/2017,1:57 PM

## 2017-08-04 NOTE — ED Provider Notes (Signed)
[facility] EMERGENCY DEPARTMENT Provider Note   CSN: [id number] Arrival date & time: 07/31/2017  9983     History   Chief Complaint Chief Complaint  Patient presents with  . Shortness of Breath    HPI [name] is a 72 y.o. female.  Level 5 caveat for urgent need for intervention.  Complex patient with end-stage renal disease on dialysis Tuesday, Thursday, Saturday and multiple other comorbidities presents with dyspnea in the middle the night.  She was discharged discharge from the nursing home yesterday.  No chest pain, fever, sweats, chills.  Severity of symptoms is moderate.      Past Medical History:  Diagnosis Date  . Anemia    a. mild/chronic  . Anemia due to GI blood loss 02/26/2016  . Anemia in chronic renal disease 11/30/2014  . Cardiomyopathy, nonischemic ([place])    a. 1999 nl cath;  b. 12/05 Guidant [place];  c. 10/2005 ICD extraction 2/2 enterococcus bacteremia and Veg on RV lead;  c. 05/2008 low risk Myoview (scarring w/ some evidence of inf ischemia);  d. 11/2012 Echo: EF 15-20%;  e. 01/2013 s/p MDT [place] CRT D, ser # [id number];  f. 05/2013 Echo: EF 15%.  . Chronic systolic CHF (congestive heart failure) ([place])    a. 11/2012 Echo: EF 15-20%;  b. 05/2013 TEE EF 15%.  . CKD (chronic kidney disease), stage III ([place])    creatinin-1.44 in 1/09; 1.51 in 1/10  . Degenerative joint disease    of knees, shoulder, and hips  . Dyspnea   . E-coli UTI 06/2017  . Enterococcal infection    a. 10/2005 - AICD-explanted  . GERD (gastroesophageal reflux disease)   . Hilar density    a. infrahilar mass/adenopathy on CT scan 5/07; subsequently  resolved  . History of blood transfusion   . Hyperlipidemia   . Hypertension   . Hypothyroidism   . ICD (implantable cardioverter-defibrillator) infection ([place])    removed 2016  . Implantable cardioverter-defibrillator-CRT- Mdt    a.  01/2013 s/p MDT [place] CRT D, ser # [id number] - ICD has been removed  . LBBB (left bundle branch  block)   . Obstructive sleep apnea    uses cpap  . PAF (paroxysmal atrial fibrillation) ([place])    a. 07/2012 s/p TEE/DCCV;  b. chronic coumadin;  c. 05/2013 Recurrent Afib->TEE/DCCV and amio initiation.  . Peripheral vascular disease ([place])   . Pneumonia 07/2005  . Pulmonary embolism ([place])    a. 07/2005 after total right hip arthroplasty  . Tobacco abuse    a. discontinued in 1997, and then resumed  . Type II diabetes mellitus ([place])    type 2  . Urinary incontinence   . Villous adenoma of colon    a. tubovillous adenomatous polyp with focal high grade dysplasia; presented with hematochezia - followed by Dr. Laural Golden.    Patient Active Problem List   Diagnosis Date Noted  . Nonischemic cardiomyopathy ([place])   . Hyperglycemia   . Dependence on renal dialysis ([place])   . Leukocytosis   . Acute blood loss anemia   . Palliative care by specialist   . Goals of care, counseling/discussion   . Generalized weakness   . Acute on chronic systolic CHF (congestive heart failure), NYHA class 4 ([place]) 07/02/2017  . Acute on chronic systolic (congestive) heart failure ([place]) 06/26/2017  . UTI (urinary tract infection) 06/26/2017  . Obesity (BMI 30-39.9) 09/15/2016  . OSA (obstructive sleep apnea) 09/14/2016  . Anemia due  to GI blood loss 02/26/2016  . Acute respiratory failure with hypoxia ([place]) 01/08/2015  . Paroxysmal atrial fibrillation ([place]) 01/08/2015  . Diabetes mellitus type 2 with complications ([place]) 54/56/2563  . Healthcare-associated pneumonia 01/07/2015  . HCAP (healthcare-associated pneumonia) 01/07/2015  . ICD (implantable cardioverter-defibrillator) infection ([place])   . Enterococcal bacteremia   . Diabetic feet ([place])   . Bacteremia 12/07/2014  . SOB (shortness of breath)   . Acute on chronic combined systolic and diastolic CHF (congestive heart failure) ([place]) 12/01/2014  . COPD (chronic obstructive pulmonary disease) ([place]) 11/30/2014  . Anemia in chronic renal disease 11/30/2014  .  Hypothyroidism 11/30/2014  . Fever 11/30/2014  . Symptomatic anemia 10/17/2014  . Implantable cardioverter-defibrillator-CRT- Mdt   . Atrial fibrillation ([place]) 06/19/2013  . Chronic anticoagulation 08/19/2012  . Chronic combined systolic and diastolic CHF (congestive heart failure) ([place]) 08/03/2012  . History of pulmonary embolism   . Type 2 diabetes, uncontrolled, with renal manifestation ([place])   . Tobacco abuse, in remission   . Chronic renal disease, stage 4, severely decreased glomerular filtration rate (GFR) between 15-29 mL/min/1.73 square meter (HCC)   . LBBB 01/29/2009  . Dyslipidemia 06/04/2007  . HTN (hypertension) 06/04/2007    Past Surgical History:  Procedure Laterality Date  . A-V CARDIAC PACEMAKER INSERTION  12/05   Biventricular pacemaker/AICD  . ABDOMINAL HYSTERECTOMY  1990/92   Initial partial hysterectomy followed by BSO  . BASCILIC VEIN TRANSPOSITION Right 03/05/2017   Procedure: RIGHT 1ST STAGE BASCILIC VEIN TRANSPOSITION;  Surgeon: Serafina Mitchell, MD;  Location: [facility];  Service: Vascular;  Laterality: Right;  . BASCILIC VEIN TRANSPOSITION Right 05/28/2017   Procedure: BASILIC VEIN TRANSPOSITION SECOND STAGE RIGHT;  Surgeon: Serafina Mitchell, MD;  Location[facility] OR;  Service: Vascular;  Laterality: Right;  . BI-VENTRICULAR IMPLANTABLE CARDIOVERTER DEFIBRILLATOR N/A 01/24/2013   Procedure: BI-VENTRICULAR IMPLANTABLE CARDIOVERTER DEFIBRILLATOR  (CRT-D);  Surgeon: Evans Lance, MD;  Location[facility] CATH LAB;  Service: Cardiovascular;  Laterality: N/A;  . BI-VENTRICULAR IMPLANTABLE CARDIOVERTER DEFIBRILLATOR  (CRT-D)  01/24/2013  . CARDIAC CATHETERIZATION    . CARDIOVERSION N/A 08/20/2012   Procedure: TEE GUIDED CARDIOVERSION;  Surgeon: Yehuda Savannah, MD;  Location: [facility] ORS;  Service: Cardiovascular;  Laterality: N/A;  To be done @ bedside  . CARDIOVERSION N/A 06/20/2013   Procedure: CARDIOVERSION;  Surgeon: Dorothy Spark, MD;  Location: [facility] ENDOSCOPY;  Service:  Cardiovascular;  Laterality: N/A;  . CARDIOVERSION N/A 07/10/2017   Procedure: CARDIOVERSION;  Surgeon: Larey Dresser, MD;  Location: [facility] ENDOSCOPY;  Service: Cardiovascular;  Laterality: N/A;  . CATARACT EXTRACTION W/PHACO Left 08/07/2015   Procedure: CATARACT EXTRACTION PHACO AND INTRAOCULAR LENS PLACEMENT ([place]);  Surgeon: Rutherford Guys, MD;  Location: [facility] ORS;  Service: Ophthalmology;  Laterality: Left;  CDE: 7.88  . CATARACT EXTRACTION W/PHACO Right 08/21/2015   Procedure: CATARACT EXTRACTION PHACO AND INTRAOCULAR LENS PLACEMENT (IOC);  Surgeon: Rutherford Guys, MD;  Location: [facility];  Service: Ophthalmology;  Laterality: Right;  CDE:8.55  . COLONOSCOPY Left 10/24/2014   Procedure: COLONOSCOPY;  Surgeon: Carol Ada, MD;  Location: [facility] ENDOSCOPY;  Service: Endoscopy;  Laterality: Left;  . COLONOSCOPY W/ POLYPECTOMY  2009  . COLONOSCOPY WITH ESOPHAGOGASTRODUODENOSCOPY (EGD) N/A 06/10/2013   Procedure: COLONOSCOPY WITH ESOPHAGOGASTRODUODENOSCOPY (EGD);  Surgeon: Rogene [place], MD;  [facility];  Service: Endoscopy;  Laterality: N/A;  925  . ESOPHAGOGASTRODUODENOSCOPY N/A 10/21/2014   Procedure: ESOPHAGOGASTRODUODENOSCOPY (EGD);  Surgeon: Inda Castle, MD;  Location: [facility];  Service: Endoscopy;  Laterality: N/A;  . GIVENS CAPSULE STUDY N/A 10/24/2014   Procedure: GIVENS CAPSULE STUDY;  Surgeon: Carol Ada, MD;  Location: [facility];  Service: Endoscopy;  Laterality: N/A;  . ICD LEAD REMOVAL N/A 12/13/2014   Procedure: ICD LEAD REMOVAL/EXTRACTION ;  Surgeon: Evans Lance, MD;  Location: [facility];  Service: Cardiovascular;  Laterality: N/A;  [place] back up  . KNEE ARTHROSCOPY Right 1980's?  Marland Kitchen PACEMAKER REMOVAL  11/18/05   Enterococcal infection  . RIGHT HEART CATHETERIZATION N/A 04/18/2014   Procedure: RIGHT HEART CATH;  Surgeon: Larey Dresser, MD;  Location: [facility] CATH LAB;  Service: Cardiovascular;  Laterality: N/A;  . TEE WITHOUT CARDIOVERSION N/A 08/20/2012   Procedure:  TRANSESOPHAGEAL ECHOCARDIOGRAM (TEE);  Surgeon: Yehuda Savannah, MD;  Location[facility];  Service: Cardiovascular;  Laterality: N/A;  . TEE WITHOUT CARDIOVERSION N/A 06/20/2013   Procedure: TRANSESOPHAGEAL ECHOCARDIOGRAM (TEE);  Surgeon: Dorothy Spark, MD;  Location: [facility];  Service: Cardiovascular;  Laterality: N/A;  . TEE WITHOUT CARDIOVERSION N/A 12/08/2014   Procedure: TRANSESOPHAGEAL ECHOCARDIOGRAM (TEE);  Surgeon: Fay Records, MD;  [facility];  Service: Cardiovascular;  Laterality: N/A;  . TEE WITHOUT CARDIOVERSION N/A 07/10/2017   Procedure: TRANSESOPHAGEAL ECHOCARDIOGRAM (TEE);  Surgeon: Larey Dresser, MD;  Location: [facility] ENDOSCOPY;  Service: Cardiovascular;  Laterality: N/A;  . TOTAL HIP ARTHROPLASTY Right 07/2005  . TUBAL LIGATION  1980's    OB History    No data available       Home Medications    Prior to Admission medications   Medication Sig Start Date End Date Taking? Authorizing Provider  amiodarone (PACERONE) 200 MG tablet Take 1 tablet (200 mg total) by mouth 2 (two) times daily. 07/22/17   Clegg, Amy D, NP  calcitRIOL (ROCALTROL) 0.25 MCG capsule Take 0.25 mcg by mouth daily. 06/09/16   [provider]  glipiZIDE (GLUCOTROL) 5 MG tablet Take 0.5 tablets (2.5 mg total) by mouth daily before breakfast. 07/15/17 07/15/18  Mariel Aloe, MD  guaiFENesin (ROBITUSSIN) 100 MG/5ML SOLN Take 5 mLs by mouth every 4 (four) hours as needed for cough or to loosen phlegm.    [provider]  isosorbide mononitrate (IMDUR) 30 MG 24 hr tablet Take 1 tablet (30 mg total) by mouth daily. 10/26/14   Riccardo Dubin, MD  levothyroxine (SYNTHROID, LEVOTHROID) 50 MCG tablet Take 50 mcg by mouth daily before breakfast. 02/25/17   [provider]  midodrine (PROAMATINE) 5 MG tablet Take 1 tablet (5 mg total) by mouth every dialysis (low BP.  Please give 30 min prior to HD). 07/15/17   Mariel Aloe, MD  ondansetron (ZOFRAN) 4 MG tablet Take 1  tablet (4 mg total) by mouth every 8 (eight) hours as needed for nausea or vomiting. Reported on 11/06/2015 07/15/17   Mariel Aloe, MD  oxyCODONE-acetaminophen (PERCOCET) 10-325 MG tablet Take 1 tablet by mouth every 6 (six) hours as needed for pain. 07/15/17   Mariel Aloe, MD  polyethylene glycol powder (GLYCOLAX/MIRALAX) powder Take 17 g by mouth daily. 06/30/17   Dhungel, Flonnie Overman, MD  pravastatin (PRAVACHOL) 40 MG tablet Take 40 mg by mouth at bedtime.     [provider]  senna (SENOKOT) 8.6 MG TABS tablet Take 2 tablets by mouth daily as needed for mild constipation.    [provider]  warfarin (COUMADIN) 2.5 MG tablet Take 1.25 mg (1/2 tablet) daily except 2.5 mg (1 tablet) on Mon/Wed/Fri    [provider]    Family History Family History  Problem Relation Age of Onset  . Hypertension Mother   . Diabetes Mother   . Coronary artery disease Father   . Diabetes Brother   . Hypertension Brother   . Lung cancer Brother   . Arthritis Other   . Diabetes Other   . Heart disease Other        female < 51    Social History Social History   Tobacco Use  . Smoking status: Former Smoker    Years: 12.00    Types: Cigarettes  . Smokeless tobacco: Never Used  . Tobacco comment: 05/2013: Smokes an occasional cigarette.  Says that she doesn't inhale.  Substance Use Topics  . Alcohol use: No    Alcohol/week: 0.0 oz  . Drug use: No     Allergies   Patient has no known allergies.   Review of Systems Review of Systems  Unable to perform ROS: Acuity of condition     Physical Exam Updated Vital Signs BP (!) 150/71   Pulse 95   Resp 20   Ht 5\' 5"  (1.651 m)   Wt 85.3 kg (188 lb)   SpO2 97%   BMI 31.28 kg/m   Physical Exam  Constitutional: She is oriented to person, place, and time.  Dyspneic on initial exam  HENT:  Head: Normocephalic and atraumatic.  Eyes: Conjunctivae are normal.  Neck: Neck supple.  Cardiovascular: Normal rate and regular  rhythm.  Pulmonary/Chest: Breath sounds normal.  Dyspneic and tachypneic  Abdominal: Soft. Bowel sounds are normal.  Musculoskeletal: Normal range of motion.  Neurological: She is alert and oriented to person, place, and time.  Skin: Skin is warm and dry.  2+ peripheral edema  Psychiatric: She has a normal mood and affect. Her behavior is normal.  Nursing note and vitals reviewed.    ED Treatments / Results  Labs (all labs ordered are listed, but only abnormal results are displayed) Labs Reviewed  BASIC METABOLIC PANEL - Abnormal; Notable for the following components:      Result Value   Sodium 128 (*)    Potassium 6.3 (*)    Chloride 83 (*)    CO2 15 (*)    Glucose, Bld 183 (*)    BUN 62 (*)    Creatinine, Ser 5.57 (*)    GFR calc non Af Amer 7 (*)    GFR calc Af Amer 8 (*)    Anion gap >20 (*)    All other components within normal limits  CBC WITH DIFFERENTIAL/PLATELET - Abnormal; Notable for the following components:   WBC 13.3 (*)    MCV 106.8 (*)    RDW 23.0 (*)    Neutro Abs 11.9 (*)    All other components within normal limits  TROPONIN I - Abnormal; Notable for the following components:   Troponin I 0.09 (*)    All other components within normal limits  PROTIME-INR - Abnormal; Notable for the following components:   Prothrombin Time 28.0 (*)    All other components within normal limits    EKG  EKG Interpretation  Date/Time:  Tuesday August 04 2017 06:54:32 EST Ventricular Rate:  108 PR Interval:    QRS Duration: 237 QT Interval:  468 QTC Calculation: 645 R Axis:   -127 Text Interpretation:  Right and left arm electrode reversal, interpretation assumes no reversal Undetermined rhythm Atrial premature complex Left bundle branch block When compared with ECG of 12/20/2017, Rightward axis is  now present Confirmed by Delora Fuel ([id number]) on 08/14/2017 7:01:27 AM       Radiology Dg Chest Port 1 View  Result Date: 08/13/2017 CLINICAL DATA:  Worsening  shortness of breath. EXAM: PORTABLE CHEST 1 VIEW COMPARISON:  PA and lateral chest 10/19/2014 and 07/02/2017. Single-view of the chest 06/26/2016. FINDINGS: Lung volumes are lower than on the comparison exams. Bibasilar scar is again seen. Heart size is upper normal. No pneumothorax or pleural effusion. No acute bony abnormality. Severe bilateral glenohumeral degenerative disease is noted. IMPRESSION: No acute abnormality in a low volume chest. Electronically Signed   By: Inge Rise M.D.   On: 08/08/2017 07:41    Procedures Procedures (including critical care time)  Medications Ordered in ED Medications - No data to display   Initial Impression / Assessment and Plan / ED Course  I have reviewed the triage vital signs and the nursing notes.  Pertinent labs & imaging results that were available during my care of the patient were reviewed by me and considered in my medical decision making (see chart for details).     Complex patient with end-stage renal disease and multiple other comorbidities presents with dyspnea.  Potassium elevated.  Troponin minimally elevated.  Chest x-ray shows no obvious heart failure.  Suspect fluid overload.  Will consult nephrologist for dialysis admit to general medicine.   CRITICAL CARE Performed by: Nat Christen  ?  Total critical care time: 30 minutes  Critical care time was exclusive of separately billable procedures and treating other patients.  Critical care was necessary to treat or prevent imminent or life-threatening deterioration.  Critical care was time spent personally by me on the following activities: development of treatment plan with patient and/or surrogate as well as nursing, discussions with consultants, evaluation of patient's response to treatment, examination of patient, obtaining history from patient or surrogate, ordering and performing treatments and interventions, ordering and review of laboratory studies, ordering and review of  radiographic studies, pulse oximetry and re-evaluation of patient's condition.  Final Clinical Impressions(s) / ED Diagnoses   Final diagnoses:  Dyspnea, unspecified type  ESRD (end stage renal disease) ([place])  Hyperkalemia    ED Discharge Orders    None       Nat Christen, MD 07/29/2017 [phone]

## 2017-08-04 NOTE — Progress Notes (Signed)
Patient brought from ED requiring immediate dialysis. During initial patient assessment this nurse noticed that patient was guppy breathing, extremities were freezing cold to touch with cyanosis noted in toes and fingernails. Blood pressure was very soft and fluid bolus was initiated. Patient was placed on NRB placed to help with oxygenation as dialysis nurse began to set up. Husband confirmed code status of DNR. MD arrived on floor to speak to husband and husband decided to keep patient comfortable at this time. Will continue to monitor.

## 2017-08-04 NOTE — Progress Notes (Signed)
Patient was hypotensive with SBP in 80s. Received 200 cc bolus in ED is. Became hypoxic on 2L to low  80s and lethargic. RRT called. patient was about to start HD but BP in 59D systolic. Spoke with patient's  husband, his son and daughters at length including several family members. Husband and daughters understand that patient has several comorbidities and severe deconditioning.  They also understand that patient will need to be on pressors to keep her blood pressure up in order to facilitate dialysis.  Family understand that recently pt has had  an extremely poor quality of life and progressive decline in her symptoms.  They do not want her to be placed on a mechanical ventilator, undergo CPR or use pressors.  They wish patient to be made comfort care and no further HD, labs draws and agree with low-dose morphine drip for dyspnea and comfort along with as needed Ativan for anxiety. I have started her on low-dose morphine drip and will transfer her to medical floor. Given her rapid deterioration in symptoms I anticipate in hospital death.  Family agreed that if symptoms are more stable by tomorrow we can look into residential hospice.

## 2017-08-04 NOTE — ED Triage Notes (Signed)
Pt brought in by [facility] for c/o sob that started yesterday; pt is a dialysis patient; pt last went to dialysis on Saturday; pt's O2 sats intially in the 60's; pt placed on NRB and pt increased to 100%

## 2017-08-04 NOTE — Progress Notes (Signed)
Inpatient Diabetes Program Recommendations  AACE/ADA: New Consensus Statement on Inpatient Glycemic Control (2015)  Target Ranges:  Prepandial:   less than 140 mg/dL      Peak postprandial:   less than 180 mg/dL (1-2 hours)      Critically ill patients:  140 - 180 mg/dL   Lab Results  Component Value Date   GLUCAP 169 (H) 07/15/2017   HGBA1C 6.6 (H) 07/03/2017    Review of Glycemic Control  Results for [name], [name] (MRN [id number]) as of 08/20/2017 11:04- lab glucose   Ref. Range 08/11/2017 07:13  Glucose Latest Ref Range: 65 - 99 mg/dL 183 (H)    Diabetes history: Type 2 Outpatient Diabetes medications: Glucotrol 2.5mg  q day  Current orders for Inpatient glycemic control: Glucotrol 2.5mg  qday  Inpatient Diabetes Program Recommendations: Consider stopping Glucotrol while inpatient.  Consider adding CBG tid and hs and add Novolog 0-9 units tid , Novolog 0-5 units qhs.  Please change diet to carb modified.   Gentry Fitz, RN, BA, MHA, CDE Diabetes Coordinator Inpatient Diabetes Program  [phone] (Team Pager) [phone] ([place]) 08/18/2017 11:07 AM

## 2017-08-04 NOTE — H&P (Signed)
TRH H&P   Patient Demographics:    [name], is a 72 y.o. female  MRN: [id number]   DOB - 10-18-1945  Admit Date - 07/29/2017  Outpatient Primary MD for the patient is Lemmie Evens, MD  Referring MD: Dr. Lacinda Axon  Outpatient Specialists: Dr. Aundra Dubin (heart failure) Nephrology  Patient coming from: Home  Chief Complaint  Patient presents with  . Shortness of Breath      HPI:    [name]  is a 72 y.o. female, with history of nonischemic cardiomyopathy with recent recurrent hospitalization one month back for acute on chronic systolic CHF with EF of 94-85%, diabetes mellitus type 2, advanced chronic kidney disease progressed to end-stage renal disease and started on hemodialysis 3 weeks back, persistent A. fib on amiodarone and Coumadin (underwent DC cardioversion recently), OSA on nightly CPAP, COPD, significant physical deconditioning who was hospitalized at [facility] for UTI and CHF and discharged home on 06/30/2017 got readmitted to [facility] 2 days later for decompensated CHF with prolonged hospital stay complicated by uremia requiring initiation of dialysis.  Patient was then discharged to SNF on 1/23. As per family patient has been increasingly weak and deconditioned for past several weeks.  I have known this patient when she was hospitalized last month here and was very deconditioned then. Family informed that she got discharged from SNF yesterday and was feeling short of breath.  Early this morning patient woke up with increasing shortness of breath with orthopnea and PND.  Also has noticed increased leg swellings.  She is on a Tu, th, sat hemodialysis schedule and completed her last session 3 days back.  Patient denies any headache, blurred vision, dizziness, chest pain, palpitations, abdominal pain, nausea, vomiting, fevers, chills, diarrhea, pain in muscles or  joints.  Patient reports she has not been making urine since she was started on dialysis.  Course in the ED  Patient was afebrile, tachypneic in the 20s, O2 sat in low 90s, improved on 2 L via nasal cannula.  Blood pressure was initially stable but dropped down to 80 systolic.  (Patient was started on midodrine recently for arterial hypertension) Blood work showed WBC of 13.3 K, normal hemoglobin and platelets.  Sodium of 128, potassium of 6.3, CO2 of 15, BUN of 62 and creatinine of 5.57.  Troponin of 0.09.  EKG showed sinus tachycardia of 108, RBBB, APCs and prolonged QTC of 640s. Chest x-ray was negative for acute pulmonary edema.  Hospitalist consulted for admission to down for acute on chronic systolic CHF, ESRD and hyperkalemia needing urgent dialysis.  Nephrology consulted.    Review of systems:    In addition to the HPI above,  No Fever-chills, No Headache, No changes with Vision or hearing, No problems swallowing food or Liquids, No chest pain, no cough, increasing shortness of breath with orthopnea and PND No Abdominal  pain, No Nausea or Vommitting, Bowel movements are regular, No Blood in stool  No new skin rashes or bruises, No new joints pains-aches,  Generalized weakness, increased leg swellings, no tingling, numbness in any extremity, No recent weight gain or loss, No polyuria, polydypsia or polyphagia, No significant Mental Stressors.   With Past History of the following :    Past Medical History:  Diagnosis Date  . Anemia    a. mild/chronic  . Anemia due to GI blood loss 02/26/2016  . Anemia in chronic renal disease 11/30/2014  . Cardiomyopathy, nonischemic ([place])    a. 1999 nl cath;  b. 12/05 Guidant [place];  c. 10/2005 ICD extraction 2/2 enterococcus bacteremia and Veg on RV lead;  c. 05/2008 low risk Myoview (scarring w/ some evidence of inf ischemia);  d. 11/2012 Echo: EF 15-20%;  e. 01/2013 s/p MDT [place] CRT D, ser # [id number];  f. 05/2013 Echo: EF 15%.  . Chronic  systolic CHF (congestive heart failure) ([place])    a. 11/2012 Echo: EF 15-20%;  b. 05/2013 TEE EF 15%.  . CKD (chronic kidney disease), stage III ([place])    creatinin-1.44 in 1/09; 1.51 in 1/10  . Degenerative joint disease    of knees, shoulder, and hips  . Dyspnea   . E-coli UTI 06/2017  . Enterococcal infection    a. 10/2005 - AICD-explanted  . GERD (gastroesophageal reflux disease)   . Hilar density    a. infrahilar mass/adenopathy on CT scan 5/07; subsequently  resolved  . History of blood transfusion   . Hyperlipidemia   . Hypertension   . Hypothyroidism   . ICD (implantable cardioverter-defibrillator) infection ([place])    removed 2016  . Implantable cardioverter-defibrillator-CRT- Mdt    a.  01/2013 s/p MDT [place] CRT D, ser # [id number] - ICD has been removed  . LBBB (left bundle branch block)   . Obstructive sleep apnea    uses cpap  . PAF (paroxysmal atrial fibrillation) ([place])    a. 07/2012 s/p TEE/DCCV;  b. chronic coumadin;  c. 05/2013 Recurrent Afib->TEE/DCCV and amio initiation.  . Peripheral vascular disease ([place])   . Pneumonia 07/2005  . Pulmonary embolism ([place])    a. 07/2005 after total right hip arthroplasty  . Tobacco abuse    a. discontinued in 1997, and then resumed  . Type II diabetes mellitus ([place])    type 2  . Urinary incontinence   . Villous adenoma of colon    a. tubovillous adenomatous polyp with focal high grade dysplasia; presented with hematochezia - followed by Dr. Laural Golden.      Past Surgical History:  Procedure Laterality Date  . A-V CARDIAC PACEMAKER INSERTION  12/05   Biventricular pacemaker/AICD  . ABDOMINAL HYSTERECTOMY  1990/92   Initial partial hysterectomy followed by BSO  . BASCILIC VEIN TRANSPOSITION Right 03/05/2017   Procedure: RIGHT 1ST STAGE BASCILIC VEIN TRANSPOSITION;  Surgeon: Serafina Mitchell, MD;  Location: [place];  Service: Vascular;  Laterality: Right;  . BASCILIC VEIN TRANSPOSITION Right 05/28/2017   Procedure: BASILIC VEIN  TRANSPOSITION SECOND STAGE RIGHT;  Surgeon: Serafina Mitchell, MD;  [facility] OR;  Service: Vascular;  Laterality: Right;  . BI-VENTRICULAR IMPLANTABLE CARDIOVERTER DEFIBRILLATOR N/A 01/24/2013   Procedure: BI-VENTRICULAR IMPLANTABLE CARDIOVERTER DEFIBRILLATOR  (CRT-D);  Surgeon: Evans Lance, MD;  Location: [facility] CATH LAB;  Service: Cardiovascular;  Laterality: N/A;  . BI-VENTRICULAR IMPLANTABLE CARDIOVERTER DEFIBRILLATOR  (CRT-D)  01/24/2013  . CARDIAC CATHETERIZATION    . CARDIOVERSION N/A  08/20/2012   Procedure: TEE GUIDED CARDIOVERSION;  Surgeon: Yehuda Savannah, MD;  Location: [facility] ORS;  Service: Cardiovascular;  Laterality: N/A;  To be done @ bedside  . CARDIOVERSION N/A 06/20/2013   Procedure: CARDIOVERSION;  Surgeon: Dorothy Spark, MD;  Location: [facility] ENDOSCOPY;  Service: Cardiovascular;  Laterality: N/A;  . CARDIOVERSION N/A 07/10/2017   Procedure: CARDIOVERSION;  Surgeon: Larey Dresser, MD;  Location: [facility] ENDOSCOPY;  Service: Cardiovascular;  Laterality: N/A;  . CATARACT EXTRACTION W/PHACO Left 08/07/2015   Procedure: CATARACT EXTRACTION PHACO AND INTRAOCULAR LENS PLACEMENT ([place]);  Surgeon: Rutherford Guys, MD;  Location: [facility] ORS;  Service: Ophthalmology;  Laterality: Left;  CDE: 7.88  . CATARACT EXTRACTION W/PHACO Right 08/21/2015   Procedure: CATARACT EXTRACTION PHACO AND INTRAOCULAR LENS PLACEMENT (IOC);  Surgeon: Rutherford Guys, MD;  Location: [facility];  Service: Ophthalmology;  Laterality: Right;  CDE:8.55  . COLONOSCOPY Left 10/24/2014   Procedure: COLONOSCOPY;  Surgeon: Carol Ada, MD;  Location[facility] ENDOSCOPY;  Service: Endoscopy;  Laterality: Left;  . COLONOSCOPY W/ POLYPECTOMY  2009  . COLONOSCOPY WITH ESOPHAGOGASTRODUODENOSCOPY (EGD) N/A 06/10/2013   Procedure: COLONOSCOPY WITH ESOPHAGOGASTRODUODENOSCOPY (EGD);  Surgeon: Rogene Houston, MD;  [facility];  Service: Endoscopy;  Laterality: N/A;  925  . ESOPHAGOGASTRODUODENOSCOPY N/A 10/21/2014   Procedure:  ESOPHAGOGASTRODUODENOSCOPY (EGD);  Surgeon: Inda Castle, MD;  Location: [place];  Service: Endoscopy;  Laterality: N/A;  . GIVENS CAPSULE STUDY N/A 10/24/2014   Procedure: GIVENS CAPSULE STUDY;  Surgeon: Carol Ada, MD;  Location: [place];  Service: Endoscopy;  Laterality: N/A;  . ICD LEAD REMOVAL N/A 12/13/2014   Procedure: ICD LEAD REMOVAL/EXTRACTION ;  Surgeon: Evans Lance, MD;  Location: [place];  Service: Cardiovascular;  Laterality: N/A;  [place] back up  . KNEE ARTHROSCOPY Right 1980's?  Marland Kitchen PACEMAKER REMOVAL  11/18/05   Enterococcal infection  . RIGHT HEART CATHETERIZATION N/A 04/18/2014   Procedure: RIGHT HEART CATH;  Surgeon: Larey Dresser, MD;  Location: [facility] CATH LAB;  Service: Cardiovascular;  Laterality: N/A;  . TEE WITHOUT CARDIOVERSION N/A 08/20/2012   Procedure: TRANSESOPHAGEAL ECHOCARDIOGRAM (TEE);  Surgeon: Yehuda Savannah, MD;  Location[facility];  Service: Cardiovascular;  Laterality: N/A;  . TEE WITHOUT CARDIOVERSION N/A 06/20/2013   Procedure: TRANSESOPHAGEAL ECHOCARDIOGRAM (TEE);  Surgeon: Dorothy Spark, MD;  Location: [place];  Service: Cardiovascular;  Laterality: N/A;  . TEE WITHOUT CARDIOVERSION N/A 12/08/2014   Procedure: TRANSESOPHAGEAL ECHOCARDIOGRAM (TEE);  Surgeon: Fay Records, MD;  [facility];  Service: Cardiovascular;  Laterality: N/A;  . TEE WITHOUT CARDIOVERSION N/A 07/10/2017   Procedure: TRANSESOPHAGEAL ECHOCARDIOGRAM (TEE);  Surgeon: Larey Dresser, MD;  Location: [facility] ENDOSCOPY;  Service: Cardiovascular;  Laterality: N/A;  . TOTAL HIP ARTHROPLASTY Right 07/2005  . TUBAL LIGATION  1980's      Social History:     Social History   Tobacco Use  . Smoking status: Former Smoker    Years: 12.00    Types: Cigarettes  . Smokeless tobacco: Never Used  . Tobacco comment: 05/2013: Smokes an occasional cigarette.  Says that she doesn't inhale.  Substance Use Topics  . Alcohol use: No    Alcohol/week: 0.0 oz     Lives  -home with husband  Mobility -limited due to generalized weakness     Family History :     Family History  Problem Relation Age of Onset  . Hypertension Mother   . Diabetes Mother   . Coronary artery disease Father   .  Diabetes Brother   . Hypertension Brother   . Lung cancer Brother   . Arthritis Other   . Diabetes Other   . Heart disease Other        female < 55      Home Medications:   Prior to Admission medications   Medication Sig Start Date End Date Taking? Authorizing Provider  amiodarone (PACERONE) 200 MG tablet Take 1 tablet (200 mg total) by mouth 2 (two) times daily. 07/22/17  Yes Clegg, Amy D, NP  glipiZIDE (GLUCOTROL) 5 MG tablet Take 0.5 tablets (2.5 mg total) by mouth daily before breakfast. 07/15/17 07/15/18 Yes Mariel Aloe, MD  isosorbide mononitrate (IMDUR) 30 MG 24 hr tablet Take 1 tablet (30 mg total) by mouth daily. 10/26/14  Yes Riccardo Dubin, MD  midodrine (PROAMATINE) 5 MG tablet Take 1 tablet (5 mg total) by mouth every dialysis (low BP.  Please give 30 min prior to HD). Patient taking differently: Take 5 mg by mouth every dialysis (low BP.  Please give 30 min prior to HD). Tuesday, Thursday and Saturday 07/15/17  Yes Mariel Aloe, MD  oxyCODONE-acetaminophen (PERCOCET) 10-325 MG tablet Take 1 tablet by mouth every 6 (six) hours as needed for pain. 07/15/17  Yes Mariel Aloe, MD  polyethylene glycol powder (GLYCOLAX/MIRALAX) powder Take 17 g by mouth daily. 06/30/17  Yes Kaizer Dissinger, MD  pravastatin (PRAVACHOL) 40 MG tablet Take 40 mg by mouth at bedtime.    Yes [provider]  calcitRIOL (ROCALTROL) 0.25 MCG capsule Take 0.25 mcg by mouth daily. 06/09/16   [provider]  guaiFENesin (ROBITUSSIN) 100 MG/5ML SOLN Take 5 mLs by mouth every 4 (four) hours as needed for cough or to loosen phlegm.    [provider]  levothyroxine (SYNTHROID, LEVOTHROID) 50 MCG tablet Take 50 mcg by mouth daily before breakfast. 02/25/17    [provider]  lisinopril (PRINIVIL,ZESTRIL) 10 MG tablet Take 1 tablet by mouth daily. 07/28/17   [provider]  ondansetron (ZOFRAN) 4 MG tablet Take 1 tablet (4 mg total) by mouth every 8 (eight) hours as needed for nausea or vomiting. Reported on 11/06/2015 07/15/17   Mariel Aloe, MD  senna (SENOKOT) 8.6 MG TABS tablet Take 2 tablets by mouth daily as needed for mild constipation.    [provider]  warfarin (COUMADIN) 2.5 MG tablet Take 1.25 mg (1/2 tablet) daily except 2.5 mg (1 tablet) on Mon/Wed/Fri    [provider]     Allergies:    No Known Allergies   Physical Exam:   Vitals  Blood pressure 106/75, pulse 98, resp. rate (!) 29, height 5\' 5"  (1.651 m), weight 85.3 kg (188 lb), SpO2 98 %.   General: Elderly female lying in bed, appears fatigued and dyspneic HEENT: No pallor, no icterus, pupils reactive bilaterally, EOMI, moist mucosa, supple neck, JVD + Chest: Diminished bilateral breath sounds, no added sound CVS: S1 and S2 tachycardic, no murmurs rubs or gallop GI: Soft, nondistended, nontender, bowel sounds present Musculoskeletal: Warm, 2+ pitting edema bilaterally, normal skin, right upper extremity AV graft CNS: Alert and oriented    Data Review:    CBC Recent Labs  Lab 08/07/2017 0713  WBC 13.3*  HGB 13.6  HCT 43.9  PLT 166  MCV 106.8*  MCH 33.1  MCHC 31.0  RDW 23.0*  LYMPHSABS 0.7  MONOABS 0.7  EOSABS 0.0  BASOSABS 0.0   ------------------------------------------------------------------------------------------------------------------  Chemistries  Recent Labs  Lab 08/18/2017  0713  NA 128*  K 6.3*  CL 83*  CO2 15*  GLUCOSE 183*  BUN 62*  CREATININE 5.57*  CALCIUM 9.5   ------------------------------------------------------------------------------------------------------------------ estimated creatinine clearance is 9.7 mL/min (A) (by C-G formula based on SCr of 5.57 mg/dL  (H)). ------------------------------------------------------------------------------------------------------------------ No results for input(s): TSH, T4TOTAL, T3FREE, THYROIDAB in the last 72 hours.  Invalid input(s): FREET3  Coagulation profile Recent Labs  Lab 08/07/2017 0713  INR 2.64   ------------------------------------------------------------------------------------------------------------------- No results for input(s): DDIMER in the last 72 hours. -------------------------------------------------------------------------------------------------------------------  Cardiac Enzymes Recent Labs  Lab 07/29/2017 0713  TROPONINI 0.09*   ------------------------------------------------------------------------------------------------------------------    Component Value Date/Time   BNP 2,179.2 (H) 07/03/2017 0501     ---------------------------------------------------------------------------------------------------------------  Urinalysis    Component Value Date/Time   COLORURINE YELLOW 07/02/2017 1123   APPEARANCEUR HAZY (A) 07/02/2017 1123   LABSPEC 1.013 07/02/2017 1123   PHURINE 5.0 07/02/2017 1123   GLUCOSEU NEGATIVE 07/02/2017 1123   HGBUR LARGE (A) 07/02/2017 1123   BILIRUBINUR NEGATIVE 07/02/2017 1123   KETONESUR NEGATIVE 07/02/2017 1123   PROTEINUR NEGATIVE 07/02/2017 1123   UROBILINOGEN 0.2 01/08/2015 0940   NITRITE NEGATIVE 07/02/2017 1123   LEUKOCYTESUR NEGATIVE 07/02/2017 1123    ----------------------------------------------------------------------------------------------------------------   Imaging Results:    Dg Chest Port 1 View  Result Date: 08/03/2017 CLINICAL DATA:  Worsening shortness of breath. EXAM: PORTABLE CHEST 1 VIEW COMPARISON:  PA and lateral chest 10/19/2014 and 07/02/2017. Single-view of the chest 06/26/2016. FINDINGS: Lung volumes are lower than on the comparison exams. Bibasilar scar is again seen. Heart size is upper normal. No  pneumothorax or pleural effusion. No acute bony abnormality. Severe bilateral glenohumeral degenerative disease is noted. IMPRESSION: No acute abnormality in a low volume chest. Electronically Signed   By: Inge Rise M.D.   On: 08/04/2017 07:41    My personal review of EKG: Tachycardia at 108 with RBBB, APCs and prolonged QTC of 620   Assessment & Plan:    Principal Problem:   Acute on chronic combined systolic and diastolic CHF (congestive heart failure) ([place]) Admit to stepdown unit.  O2 via nasal cannula.  Nephrology consulted for urgent dialysis.  Patient does have diminished lung sounds and increased leg swellings on exam. Monitor volume status improvement in symptoms post dialysis.  Patient was on Lasix previously which have been discontinued after she was started on dialysis recently. Recent 2D echo with low EF of 25%.   Active Problems:   Hypotension, arterial Hold Imdur.  Continue midodrine.  Lisinopril and Lasix were discontinued during recent hospital discharge.    Type 2 diabetes, uncontrolled, with renal manifestation (HCC) Continue glipizide.  Monitor on sliding scale coverage  History of atrial fibrillation  recent TEE with successful DC cardioversion.  Currently in sinus rhythm with APCs.  Continue amiodarone and Coumadin.    OSA (obstructive sleep apnea) CPAP at night     ESRD on hemodialysis [facility]) Recently started on a tu, th, sat schedule.  Nephrology consulted for hemodialysis today.    Hyperkalemia Monitor after dialysis.  Continue telemetry.    FTT (failure to thrive) in adult Progressive deconditioning.  PT evaluation.  Palliative care saw patient during her hospitalization at [facility] last month.  Patient and family wish for full scope of treatment.  Palliative care recommended outpatient follow-up. Patient will benefit from repeat goals of care discussion since I think she will have recurrent ED visits or hospitalizations with volume overload in  future.  Elevated troponin Suspect demand ischemia.  No  chest pain symptoms or EKG changes noted.  We will not pursue further testing unless new symptoms or telemetry changes.  Hypothyroidism Continue Synthroid.   DVT Prophylaxis on Coumadin  AM Labs Ordered, also please review Full Orders  Family Communication: Admission, patients condition and plan of care including tests being ordered have been discussed with the patient, husband and daughter at bedside   Code Status: Full  Likely DC to: undetermined   Condition GUARDED    Consults called: Nephrology  Admission status: observation  Time spent in minutes : 50   Om Lizotte M.D on 08/15/2017 at 11:00 AM  Between 7am to 7pm - Pager - [phone]. After 7pm go to [email or web address] - password [facility]  [facility] - Office  [phone]

## 2017-08-04 NOTE — Progress Notes (Signed)
Inpatient Diabetes Program Recommendations  AACE/ADA: New Consensus Statement on Inpatient Glycemic Control (2015)  Target Ranges:  Prepandial:   less than 140 mg/dL      Peak postprandial:   less than 180 mg/dL (1-2 hours)      Critically ill patients:  140 - 180 mg/dL   Lab Results  Component Value Date   GLUCAP 169 (H) 07/15/2017   HGBA1C 6.6 (H) 07/03/2017   Consider changing her diet to carb modified.   Gentry Fitz, RN, BA, MHA, CDE Diabetes Coordinator Inpatient Diabetes Program  [phone] (Team Pager) [phone] ([place]) 07/25/2017 4:29 PM

## 2017-08-04 NOTE — Consult Note (Addendum)
Reason for Consult: End-stage renal disease/hyperkalemia Referring Physician: Dr. Wynonia Hazard is an 72 y.o. female.  HPI: She is a patient who has history of hypertension, non-ischemic cardiomyopathy, end-stage renal disease on maintenance hemodialysis presently came with complaints of difficulty breathing, some orthopnea.  When she was evaluated patient was found to have hyperkalemia and some sign of fluid overload hence admitted to his hospital.  Patient last dialysis was on Saturday.  At this moment patient denies any nausea or vomiting.  Her appetite is poor.  Past Medical History:  Diagnosis Date  . Anemia    a. mild/chronic  . Anemia due to GI blood loss 02/26/2016  . Anemia in chronic renal disease 11/30/2014  . Cardiomyopathy, nonischemic ([place])    a. 1999 nl cath;  b. 12/05 [facility] [place];  c. 10/2005 ICD extraction 2/2 enterococcus bacteremia and Veg on RV lead;  c. 05/2008 low risk Myoview (scarring w/ some evidence of inf ischemia);  d. 11/2012 Echo: EF 15-20%;  e. 01/2013 s/p MDT [place] CRT D, ser # [id number];  f. 05/2013 Echo: EF 15%.  . Chronic systolic CHF (congestive heart failure) ([place])    a. 11/2012 Echo: EF 15-20%;  b. 05/2013 TEE EF 15%.  . CKD (chronic kidney disease), stage III ([place])    creatinin-1.44 in 1/09; 1.51 in 1/10  . Degenerative joint disease    of knees, shoulder, and hips  . Dyspnea   . E-coli UTI 06/2017  . Enterococcal infection    a. 10/2005 - AICD-explanted  . GERD (gastroesophageal reflux disease)   . Hilar density    a. infrahilar mass/adenopathy on CT scan 5/07; subsequently  resolved  . History of blood transfusion   . Hyperlipidemia   . Hypertension   . Hypothyroidism   . ICD (implantable cardioverter-defibrillator) infection ([place])    removed 2016  . Implantable cardioverter-defibrillator-CRT- Mdt    a.  01/2013 s/p MDT [place] CRT D, ser # [id number] - ICD has been removed  . LBBB (left bundle branch block)   . Obstructive sleep apnea     uses cpap  . PAF (paroxysmal atrial fibrillation) ([place])    a. 07/2012 s/p TEE/DCCV;  b. chronic coumadin;  c. 05/2013 Recurrent Afib->TEE/DCCV and amio initiation.  . Peripheral vascular disease ([place])   . Pneumonia 07/2005  . Pulmonary embolism ([place])    a. 07/2005 after total right hip arthroplasty  . Tobacco abuse    a. discontinued in 1997, and then resumed  . Type II diabetes mellitus ([place])    type 2  . Urinary incontinence   . Villous adenoma of colon    a. tubovillous adenomatous polyp with focal high grade dysplasia; presented with hematochezia - followed by Dr. Laural Golden.    Past Surgical History:  Procedure Laterality Date  . A-V CARDIAC PACEMAKER INSERTION  12/05   Biventricular pacemaker/AICD  . ABDOMINAL HYSTERECTOMY  1990/92   Initial partial hysterectomy followed by BSO  . BASCILIC VEIN TRANSPOSITION Right 03/05/2017   Procedure: RIGHT 1ST STAGE BASCILIC VEIN TRANSPOSITION;  Surgeon: Serafina Mitchell, MD;  Location: [place];  Service: Vascular;  Laterality: Right;  . BASCILIC VEIN TRANSPOSITION Right 05/28/2017   Procedure: BASILIC VEIN TRANSPOSITION SECOND STAGE RIGHT;  Surgeon: Serafina Mitchell, MD;  [facility];  Service: Vascular;  Laterality: Right;  . BI-VENTRICULAR IMPLANTABLE CARDIOVERTER DEFIBRILLATOR N/A 01/24/2013   Procedure: BI-VENTRICULAR IMPLANTABLE CARDIOVERTER DEFIBRILLATOR  (CRT-D);  Surgeon: Evans Lance, MD;  Location: [facility] CATH LAB;  Service: Cardiovascular;  Laterality: N/A;  . BI-VENTRICULAR IMPLANTABLE CARDIOVERTER DEFIBRILLATOR  (CRT-D)  01/24/2013  . CARDIAC CATHETERIZATION    . CARDIOVERSION N/A 08/20/2012   Procedure: TEE GUIDED CARDIOVERSION;  Surgeon: Yehuda Savannah, MD;  Location: [facility] ORS;  Service: Cardiovascular;  Laterality: N/A;  To be done @ bedside  . CARDIOVERSION N/A 06/20/2013   Procedure: CARDIOVERSION;  Surgeon: Dorothy Spark, MD;  Location: [facility] ENDOSCOPY;  Service: Cardiovascular;  Laterality: N/A;  . CARDIOVERSION N/A 07/10/2017    Procedure: CARDIOVERSION;  Surgeon: Larey Dresser, MD;  Location: [facility] ENDOSCOPY;  Service: Cardiovascular;  Laterality: N/A;  . CATARACT EXTRACTION W/PHACO Left 08/07/2015   Procedure: CATARACT EXTRACTION PHACO AND INTRAOCULAR LENS PLACEMENT ([place]);  Surgeon: Rutherford Guys, MD;  Location: [facility];  Service: Ophthalmology;  Laterality: Left;  CDE: 7.88  . CATARACT EXTRACTION W/PHACO Right 08/21/2015   Procedure: CATARACT EXTRACTION PHACO AND INTRAOCULAR LENS PLACEMENT (IOC);  Surgeon: Rutherford Guys, MD;  Location: [facility];  Service: Ophthalmology;  Laterality: Right;  CDE:8.55  . COLONOSCOPY Left 10/24/2014   Procedure: COLONOSCOPY;  Surgeon: Carol Ada, MD;  Location: [facility] ENDOSCOPY;  Service: Endoscopy;  Laterality: Left;  . COLONOSCOPY W/ POLYPECTOMY  2009  . COLONOSCOPY WITH ESOPHAGOGASTRODUODENOSCOPY (EGD) N/A 06/10/2013   Procedure: COLONOSCOPY WITH ESOPHAGOGASTRODUODENOSCOPY (EGD);  Surgeon: Rogene Houston, MD;  [facility];  Service: Endoscopy;  Laterality: N/A;  925  . ESOPHAGOGASTRODUODENOSCOPY N/A 10/21/2014   Procedure: ESOPHAGOGASTRODUODENOSCOPY (EGD);  Surgeon: Inda Castle, MD;  Location: [place];  Service: Endoscopy;  Laterality: N/A;  . GIVENS CAPSULE STUDY N/A 10/24/2014   Procedure: GIVENS CAPSULE STUDY;  Surgeon: Carol Ada, MD;  Location: [place];  Service: Endoscopy;  Laterality: N/A;  . ICD LEAD REMOVAL N/A 12/13/2014   Procedure: ICD LEAD REMOVAL/EXTRACTION ;  Surgeon: Evans Lance, MD;  Location: [place];  Service: Cardiovascular;  Laterality: N/A;  [place] back up  . KNEE ARTHROSCOPY Right 1980's?  Marland Kitchen PACEMAKER REMOVAL  11/18/05   Enterococcal infection  . RIGHT HEART CATHETERIZATION N/A 04/18/2014   Procedure: RIGHT HEART CATH;  Surgeon: Larey Dresser, MD;  Location: [facility] CATH LAB;  Service: Cardiovascular;  Laterality: N/A;  . TEE WITHOUT CARDIOVERSION N/A 08/20/2012   Procedure: TRANSESOPHAGEAL ECHOCARDIOGRAM (TEE);  Surgeon: Yehuda Savannah, MD;   Location[facility];  Service: Cardiovascular;  Laterality: N/A;  . TEE WITHOUT CARDIOVERSION N/A 06/20/2013   Procedure: TRANSESOPHAGEAL ECHOCARDIOGRAM (TEE);  Surgeon: Dorothy Spark, MD;  Location: [place];  Service: Cardiovascular;  Laterality: N/A;  . TEE WITHOUT CARDIOVERSION N/A 12/08/2014   Procedure: TRANSESOPHAGEAL ECHOCARDIOGRAM (TEE);  Surgeon: Fay Records, MD;  [facility];  Service: Cardiovascular;  Laterality: N/A;  . TEE WITHOUT CARDIOVERSION N/A 07/10/2017   Procedure: TRANSESOPHAGEAL ECHOCARDIOGRAM (TEE);  Surgeon: Larey Dresser, MD;  Location: [facility] ENDOSCOPY;  Service: Cardiovascular;  Laterality: N/A;  . TOTAL HIP ARTHROPLASTY Right 07/2005  . TUBAL LIGATION  1980's    Family History  Problem Relation Age of Onset  . Hypertension Mother   . Diabetes Mother   . Coronary artery disease Father   . Diabetes Brother   . Hypertension Brother   . Lung cancer Brother   . Arthritis Other   . Diabetes Other   . Heart disease Other        female < 63    Social History:  reports that she has quit smoking. Her smoking use included cigarettes. She quit after 12.00 years of use. she  has never used smokeless tobacco. She reports that she does not drink alcohol or use drugs.  Allergies: No Known Allergies  Medications: I have reviewed the patient's current medications.  Results for orders placed or performed during the hospital encounter of 07/31/2017 (from the past 48 hour(s))  Basic metabolic panel     Status: Abnormal   Collection Time: 07/31/2017  7:13 AM  Result Value Ref Range   Sodium 128 (L) 135 - 145 mmol/L   Potassium 6.3 (HH) 3.5 - 5.1 mmol/L    Comment: CRITICAL RESULT CALLED TO, READ BACK BY AND VERIFIED WITH: DOSS,M AT 8:10AM ON 07/30/2017 BY FESTERMAN,C    Chloride 83 (L) 101 - 111 mmol/L   CO2 15 (L) 22 - 32 mmol/L   Glucose, Bld 183 (H) 65 - 99 mg/dL   BUN 62 (H) 6 - 20 mg/dL   Creatinine, Ser 5.57 (H) 0.44 - 1.00 mg/dL   Calcium 9.5 8.9 - 10.3  mg/dL   GFR calc non Af Amer 7 (L) >60 mL/min   GFR calc Af Amer 8 (L) >60 mL/min    Comment: (NOTE) The eGFR has been calculated using the CKD EPI equation. This calculation has not been validated in all clinical situations. eGFR's persistently <60 mL/min signify possible Chronic Kidney Disease.    Anion gap >20 (H) 5 - 15    Comment: Performed at [facility], [address], [place], [place] [address] CORRECTED ON 02/12 AT 0813: PREVIOUSLY REPORTED AS 30   CBC with Differential     Status: Abnormal   Collection Time: 08/02/2017  7:13 AM  Result Value Ref Range   WBC 13.3 (H) 4.0 - 10.5 K/uL   RBC 4.11 3.87 - 5.11 MIL/uL   Hemoglobin 13.6 12.0 - 15.0 g/dL   HCT 43.9 36.0 - 46.0 %   MCV 106.8 (H) 78.0 - 100.0 fL   MCH 33.1 26.0 - 34.0 pg   MCHC 31.0 30.0 - 36.0 g/dL   RDW 23.0 (H) 11.5 - 15.5 %   Platelets 166 150 - 400 K/uL   Neutrophils Relative % 90 %   Lymphocytes Relative 5 %   Monocytes Relative 5 %   Eosinophils Relative 0 %   Basophils Relative 0 %   Neutro Abs 11.9 (H) 1.7 - 7.7 K/uL   Lymphs Abs 0.7 0.7 - 4.0 K/uL   Monocytes Absolute 0.7 0.1 - 1.0 K/uL   Eosinophils Absolute 0.0 0.0 - 0.7 K/uL   Basophils Absolute 0.0 0.0 - 0.1 K/uL   WBC Morphology TOXIC GRANULATION     Comment: Performed at [facility], [address], [place], [place] [address]  Troponin I     Status: Abnormal   Collection Time: 07/26/2017  7:13 AM  Result Value Ref Range   Troponin I 0.09 (HH) <0.03 ng/mL    Comment: CRITICAL RESULT CALLED TO, READ BACK BY AND VERIFIED WITH: DOSS,M AT 8:10AM ON 08/17/2017 BY [facility] Performed at [facility], [address], [place], [place] [address]   Protime-INR     Status: Abnormal   Collection Time: 08/19/2017  7:13 AM  Result Value Ref Range   Prothrombin Time 28.0 (H) 11.4 - 15.2 seconds   INR 2.64     Comment: Performed at [facility], [address], [place], [place] [address]    Dg Chest Port 1 View  Result Date: 08/09/2017 CLINICAL DATA:   Worsening shortness of breath. EXAM: PORTABLE CHEST 1 VIEW COMPARISON:  PA and lateral chest 10/19/2014  and 07/02/2017. Single-view of the chest 06/26/2016. FINDINGS: Lung volumes are lower than on the comparison exams. Bibasilar scar is again seen. Heart size is upper normal. No pneumothorax or pleural effusion. No acute bony abnormality. Severe bilateral glenohumeral degenerative disease is noted. IMPRESSION: No acute abnormality in a low volume chest. Electronically Signed   By: Inge Rise M.D.   On: 08/09/2017 07:41    Review of Systems  Constitutional: Negative for chills and fever.  Respiratory: Positive for shortness of breath.   Cardiovascular: Positive for orthopnea and leg swelling.  Gastrointestinal: Negative for abdominal pain, nausea and vomiting.  Neurological: Positive for weakness.   Blood pressure (!) 150/71, pulse 95, resp. rate 20, height '5\' 5"'  (1.651 m), weight 85.3 kg (188 lb), SpO2 97 %. Physical Exam  Constitutional: No distress.  Eyes: No scleral icterus.  Neck: No JVD present.  Cardiovascular: Normal rate.  Murmur heard. Respiratory: No respiratory distress. She has no wheezes.  GI: She exhibits no distension. There is no tenderness.  Musculoskeletal: She exhibits edema.    Assessment/Plan: 1] difficulty breathing: Possibly secondary to salt and fluid intake.  Patient seems to be mild distress.  She is on nasal oxygen. 2] hyperkalemia: This is due to high potassium intake. 3] end-stage renal disease: She is status post hemodialysis on Saturday.  She is due for dialysis today.  Her appetite is poor but she did not have any nausea or vomiting. 4] hypertension: Her blood pressure is reasonably controlled 6] history of nonischemic cardiomyopathy: Status post ICD placement and removal because of infection. 6] anemia: Her hemoglobin is above our target goal and need to hold Erythropoietin inj 7 history of diabetes Plan: 1]We will make arrangement for patient to  get dialysis today for 4 hours 2]Will use 2K/2.5 calcium bath 3] we will try to remove about 3 L if systolic blood pressure remains above 90.  Liller Yohn S 08/03/2017, 8:25 AM

## 2017-08-04 NOTE — ED Notes (Signed)
hosptialist gave vo to give midodrine 5mg  x 2 tablets now, called pharmacist to have med brought to ed now

## 2017-08-04 NOTE — ED Notes (Addendum)
Critical result. Potassium 6.3, Trop. 0.09. [place] notified.

## 2017-08-04 NOTE — ED Notes (Signed)
X-ray at bedside

## 2017-08-04 NOTE — ED Notes (Signed)
Spoke with hospitalist re pt's bp, vo to obtain manual bp.

## 2017-08-04 NOTE — Procedures (Signed)
    EMERGENT HEMODIALYSIS NURSING NOTE:  Patient presented to [facility] via stretcher from the ED at 1400 lethargic but oriented to self/place, tachypneic, bradycardic, and hypotensive. Rapid response team was called. Order received for saline bolus and one amp D50, 10u insulin, and 63mEq sodium bicarbonate injection.  Concerns were expressed about the 22g PIV in pt's left arm.  I accessed the pt's right upper arm AVF and initiated HD with NO ultrafiltration at 1412.  Medications were administered through ECC.  Pt was placed on NRB. Dr. Clementeen Graham spoke with pt's husband about her low EF and inability to tolerate ultrafiltration with dialysis. After this discussion and with family's consent, Dr. Clementeen Graham ordered dialysis to be stopped and pt's status to be changed to "comfort care."  Dialysis treatment was terminated at 1418.  All blood was returned and hemostasis was achieved within 12 minutes.  Helon Wisinski L. Kiara Keep, RN, CDN

## 2017-08-05 ENCOUNTER — Encounter (HOSPITAL_COMMUNITY): Payer: Medicare HMO

## 2017-08-10 MED FILL — Medication: Qty: 1 | Status: AC

## 2017-08-21 NOTE — Progress Notes (Signed)
Waste amount     240 mL Waste -                Morphine (Morphine drip) Witness/Verify -    Kirtland Bouchard

## 2017-08-21 NOTE — Progress Notes (Signed)
Notified [facility]- Rep - Rocky Crafts ---   Ref No [id number]

## 2017-08-21 NOTE — Discharge Summary (Signed)
Physician Discharge Summary  IMAJEAN MCDERMID GGY:[id number] DOB: [birth year] DOA: 08/19/2017  PCP: Lemmie Evens, MD  Admit date: 07/30/2017 Discharge date: 2017/08/08  Admitted From: HOME Disposition: Patient expired on August 08, 2017 at 1 AM    Discharge Diagnoses:  Principal Problem:   Acute pulmonary edema ([place])   Active Problems:   Acute on chronic combined systolic and diastolic CHF (congestive heart failure) (HCC)   Type 2 diabetes, uncontrolled, with renal manifestation (H[facility])   Paroxysmal atrial fibrillation (H[facility])   OSA (obstructive sleep apnea)   Acute on chronic systolic (congestive) heart failure (HCC)   Nonischemic cardiomyopathy (H[facility])   ESRD on hemodialysis (H[facility])   Hyperkalemia   FTT (failure to thrive) in adult   Hypotension arterial  Brief narrative/HPI 72 y.o. female, with history of nonischemic cardiomyopathy with recent recurrent hospitalization one month back for acute on chronic systolic CHF with EF of 03-50%, diabetes mellitus type 2, advanced chronic kidney disease progressed to end-stage renal disease and started on hemodialysis 3 weeks back, persistent A. fib on amiodarone and Coumadin (underwent DC cardioversion recently), OSA on nightly CPAP, COPD, significant physical deconditioning who was hospitalized at [facility] for UTI and CHF and discharged home on 06/30/2017 got readmitted to [facility] 2 days later for decompensated CHF with prolonged hospital stay complicated by uremia requiring initiation of dialysis.  Patient was then discharged to SNF on 1/23. As per family patient has been increasingly weak and deconditioned for past several weeks.  I have known this patient when she was hospitalized last month here and was very deconditioned then. Family informed that she got discharged from SNF yesterday and was feeling short of breath.  Early this morning patient woke up with increasing shortness of breath with orthopnea and PND.  Also has noticed increased leg  swellings.  She is on a Tu, th, sat hemodialysis schedule and completed her last session 3 days back.  Patient denied any headache, blurred vision, dizziness, chest pain, palpitations, abdominal pain, nausea, vomiting, fevers, chills, diarrhea, pain in muscles or joints.  Patient reports she has not been making urine since she was started on dialysis.  Course in the ED  Patient was afebrile, tachypneic in the 20s, O2 sat in low 90s, improved on 2 L via nasal cannula.  Blood pressure was initially stable but dropped down to 80 systolic.  (Patient was started on midodrine recently for arterial hypertension) Blood work showed WBC of 13.3 K, normal hemoglobin and platelets.  Sodium of 128, potassium of 6.3, CO2 of 15, BUN of 62 and creatinine of 5.57.  Troponin of 0.09.  EKG showed sinus tachycardia of 108, RBBB, APCs and prolonged QTC of 640s. Chest x-ray was negative for acute pulmonary edema.  Hospitalist consulted for admission to down for acute on chronic systolic CHF, ESRD and hyperkalemia needing urgent dialysis.  Nephrology consulted.   Hospital course Patient was admitted to SDU for urgent HD. Patient was hypotensive with SBP in 80s. Received 200 cc bolus in ED.Marland Kitchen Became hypoxic on 2L to low  80s and lethargic. RRT called. patient was about to start HD but BP in 09F systolic. Spoke with patient's  husband, his son and daughters at length including several family members. Husband and daughters understand that patient has several comorbidities and severe deconditioning.  They also understand that patient will need to be on pressors to keep her blood pressure up in order to facilitate dialysis.  Family understand that recently pt has had  an extremely poor  quality of life and progressive decline in her symptoms.  They do not want her to be placed on a mechanical ventilator, undergo CPR or use pressors.  They wish patient to be made comfort care and no further HD, labs draws and agree with low-dose  morphine drip for dyspnea and comfort along with as needed Ativan for anxiety. I  started her on low-dose morphine drip.  Family agreed that if symptoms are more stable by next day we can look into residential hospice. anticipated hospital death.  Patient was pronounced dead on 2017-08-09 at 1:00 AM    Discharge Instructions   Allergies as of 08-09-17   No Known Allergies     Medication List        No Known Allergies     Procedures/Studies: Dg Chest Port 1 View  Result Date: 08/13/2017 CLINICAL DATA:  Worsening shortness of breath. EXAM: PORTABLE CHEST 1 VIEW COMPARISON:  PA and lateral chest 10/19/2014 and 07/02/2017. Single-view of the chest 06/26/2016. FINDINGS: Lung volumes are lower than on the comparison exams. Bibasilar scar is again seen. Heart size is upper normal. No pneumothorax or pleural effusion. No acute bony abnormality. Severe bilateral glenohumeral degenerative disease is noted. IMPRESSION: No acute abnormality in a low volume chest. Electronically Signed   By: Inge Rise M.D.   On: 08/03/2017 07:41       Discharge Exam: Vitals:   08/09/17 0030 2017/08/09 0100  BP:    Pulse: (!) 37   Resp: 19 (!) 7  Temp:    SpO2:     Vitals:   08/18/2017 2300 Aug 09, 2017 0000 08/09/2017 0030 2017-08-09 0100  BP:      Pulse: (!) 117  (!) 37   Resp: 20 (!) 23 19 (!) 7  Temp:      TempSrc:      SpO2: (!) 84%     Weight:    85.3 kg (187 lb 16 oz)  Height:            The results of significant diagnostics from this hospitalization (including imaging, microbiology, ancillary and laboratory) are listed below for reference.     Microbiology: No results found for this or any previous visit (from the past 240 hour(s)).   Labs: BNP (last 3 results) Recent Labs    06/26/17 0806 07/02/17 1131 07/03/17 0501  BNP 2,226.0* 1,605.1* 6,314.9*   Basic Metabolic Panel: Recent Labs  Lab 08/10/2017 0713  NA 128*  K 6.3*  CL 83*  CO2 15*  GLUCOSE 183*  BUN 62*   CREATININE 5.57*  CALCIUM 9.5   Liver Function Tests: No results for input(s): AST, ALT, ALKPHOS, BILITOT, PROT, ALBUMIN in the last 168 hours. No results for input(s): LIPASE, AMYLASE in the last 168 hours. No results for input(s): AMMONIA in the last 168 hours. CBC: Recent Labs  Lab 08/14/2017 0713  WBC 13.3*  NEUTROABS 11.9*  HGB 13.6  HCT 43.9  MCV 106.8*  PLT 166   Cardiac Enzymes: Recent Labs  Lab 08/12/2017 0713  TROPONINI 0.09*   BNP: Invalid input(s): POCBNP CBG: No results for input(s): GLUCAP in the last 168 hours. D-Dimer No results for input(s): DDIMER in the last 72 hours. Hgb A1c No results for input(s): HGBA1C in the last 72 hours. Lipid Profile No results for input(s): CHOL, HDL, LDLCALC, TRIG, CHOLHDL, LDLDIRECT in the last 72 hours. Thyroid function studies No results for input(s): TSH, T4TOTAL, T3FREE, THYROIDAB in the last 72 hours.  Invalid input(s): FREET3 Anemia work  up No results for input(s): VITAMINB12, FOLATE, FERRITIN, TIBC, IRON, RETICCTPCT in the last 72 hours. Urinalysis    Component Value Date/Time   COLORURINE YELLOW 07/02/2017 1123   APPEARANCEUR HAZY (A) 07/02/2017 1123   LABSPEC 1.013 07/02/2017 1123   PHURINE 5.0 07/02/2017 1123   GLUCOSEU NEGATIVE 07/02/2017 1123   HGBUR LARGE (A) 07/02/2017 1123   BILIRUBINUR NEGATIVE 07/02/2017 1123   KETONESUR NEGATIVE 07/02/2017 1123   PROTEINUR NEGATIVE 07/02/2017 1123   UROBILINOGEN 0.2 01/08/2015 0940   NITRITE NEGATIVE 07/02/2017 1123   LEUKOCYTESUR NEGATIVE 07/02/2017 1123   Sepsis Labs Invalid input(s): PROCALCITONIN,  WBC,  LACTICIDVEN Microbiology No results found for this or any previous visit (from the past 240 hour(s)).   Time coordinating discharge: < 30 minutes  SIGNED:   Louellen Molder, MD  [facility] 08/07/2017, 8:03 AM Pager   If 7PM-7AM, please contact night-coverage [email or web address] Password [email or web address]

## 2017-08-21 NOTE — Progress Notes (Signed)
Notified Physician of time of death. 0100 Verified by 2 RNs

## 2017-08-21 DEATH — deceased

## 2017-09-01 ENCOUNTER — Other Ambulatory Visit (HOSPITAL_COMMUNITY): Payer: Medicare HMO

## 2017-09-01 ENCOUNTER — Ambulatory Visit (HOSPITAL_COMMUNITY): Payer: Medicare HMO | Admitting: Internal Medicine

## 2017-09-01 ENCOUNTER — Ambulatory Visit (HOSPITAL_COMMUNITY): Payer: Medicare HMO

## 2017-09-03 ENCOUNTER — Other Ambulatory Visit: Payer: Self-pay | Admitting: Nurse Practitioner
